# Patient Record
Sex: Male | Born: 1940
Health system: Southern US, Community
[De-identification: ages and names within clinical notes are randomized; demographics above are authoritative.]

## PROBLEM LIST (undated history)

## (undated) DIAGNOSIS — IMO0002 Reserved for concepts with insufficient information to code with codable children: Secondary | ICD-10-CM

## (undated) DIAGNOSIS — C801 Malignant (primary) neoplasm, unspecified: Secondary | ICD-10-CM

## (undated) DIAGNOSIS — N400 Enlarged prostate without lower urinary tract symptoms: Secondary | ICD-10-CM

## (undated) DIAGNOSIS — E785 Hyperlipidemia, unspecified: Secondary | ICD-10-CM

## (undated) DIAGNOSIS — T7840XA Allergy, unspecified, initial encounter: Secondary | ICD-10-CM

## (undated) DIAGNOSIS — G629 Polyneuropathy, unspecified: Secondary | ICD-10-CM

## (undated) DIAGNOSIS — I10 Essential (primary) hypertension: Secondary | ICD-10-CM

## (undated) DIAGNOSIS — R001 Bradycardia, unspecified: Secondary | ICD-10-CM

## (undated) DIAGNOSIS — E291 Testicular hypofunction: Secondary | ICD-10-CM

## (undated) DIAGNOSIS — J329 Chronic sinusitis, unspecified: Secondary | ICD-10-CM

## (undated) DIAGNOSIS — D649 Anemia, unspecified: Secondary | ICD-10-CM

## (undated) DIAGNOSIS — L0293 Carbuncle, unspecified: Secondary | ICD-10-CM

## (undated) DIAGNOSIS — M1990 Unspecified osteoarthritis, unspecified site: Secondary | ICD-10-CM

## (undated) DIAGNOSIS — J189 Pneumonia, unspecified organism: Secondary | ICD-10-CM

## (undated) DIAGNOSIS — J309 Allergic rhinitis, unspecified: Secondary | ICD-10-CM

## (undated) DIAGNOSIS — D333 Benign neoplasm of cranial nerves: Secondary | ICD-10-CM

## (undated) HISTORY — DX: Anemia, unspecified: D64.9

## (undated) HISTORY — DX: Reserved for concepts with insufficient information to code with codable children: IMO0002

## (undated) HISTORY — DX: Unspecified osteoarthritis, unspecified site: M19.90

## (undated) HISTORY — DX: Benign neoplasm of cranial nerves: D33.3

## (undated) HISTORY — PX: COLONOSCOPY: SHX174

## (undated) HISTORY — DX: Carbuncle, unspecified: L02.93

## (undated) HISTORY — DX: Testicular hypofunction: E29.1

## (undated) HISTORY — DX: Hyperlipidemia, unspecified: E78.5

## (undated) HISTORY — PX: POLYPECTOMY: SHX149

## (undated) HISTORY — DX: Allergy, unspecified, initial encounter: T78.40XA

## (undated) HISTORY — PX: STOMACH SURGERY: SHX791

## (undated) HISTORY — PX: CATARACT EXTRACTION: SUR2

## (undated) HISTORY — DX: Essential (primary) hypertension: I10

## (undated) HISTORY — PX: UPPER GASTROINTESTINAL ENDOSCOPY: SHX188

## (undated) HISTORY — DX: Allergic rhinitis, unspecified: J30.9

## (undated) HISTORY — DX: Chronic sinusitis, unspecified: J32.9

## (undated) HISTORY — DX: Benign prostatic hyperplasia without lower urinary tract symptoms: N40.0

---

## 1998-04-15 ENCOUNTER — Encounter: Payer: Self-pay | Admitting: Neurosurgery

## 1998-04-15 ENCOUNTER — Ambulatory Visit (HOSPITAL_COMMUNITY): Admission: RE | Admit: 1998-04-15 | Discharge: 1998-04-15 | Payer: Self-pay | Admitting: Neurosurgery

## 2003-01-17 ENCOUNTER — Encounter: Payer: Self-pay | Admitting: Emergency Medicine

## 2003-01-17 ENCOUNTER — Emergency Department (HOSPITAL_COMMUNITY): Admission: EM | Admit: 2003-01-17 | Discharge: 2003-01-17 | Payer: Self-pay | Admitting: Emergency Medicine

## 2005-05-11 ENCOUNTER — Emergency Department (HOSPITAL_COMMUNITY): Admission: EM | Admit: 2005-05-11 | Discharge: 2005-05-12 | Payer: Self-pay | Admitting: Emergency Medicine

## 2005-05-23 ENCOUNTER — Ambulatory Visit: Payer: Self-pay | Admitting: Internal Medicine

## 2005-05-25 ENCOUNTER — Ambulatory Visit: Payer: Self-pay | Admitting: Internal Medicine

## 2005-06-16 ENCOUNTER — Ambulatory Visit: Payer: Self-pay | Admitting: Gastroenterology

## 2005-06-22 ENCOUNTER — Encounter (INDEPENDENT_AMBULATORY_CARE_PROVIDER_SITE_OTHER): Payer: Self-pay | Admitting: *Deleted

## 2005-06-22 ENCOUNTER — Ambulatory Visit: Payer: Self-pay | Admitting: Gastroenterology

## 2005-06-22 LAB — HM COLONOSCOPY

## 2005-07-05 ENCOUNTER — Ambulatory Visit: Payer: Self-pay | Admitting: Internal Medicine

## 2005-08-05 ENCOUNTER — Inpatient Hospital Stay (HOSPITAL_COMMUNITY): Admission: EM | Admit: 2005-08-05 | Discharge: 2005-08-09 | Payer: Self-pay | Admitting: Emergency Medicine

## 2005-08-07 ENCOUNTER — Ambulatory Visit: Payer: Self-pay | Admitting: Internal Medicine

## 2005-08-16 ENCOUNTER — Ambulatory Visit: Payer: Self-pay | Admitting: Internal Medicine

## 2005-10-04 ENCOUNTER — Ambulatory Visit: Payer: Self-pay | Admitting: Internal Medicine

## 2005-11-22 ENCOUNTER — Ambulatory Visit: Payer: Self-pay | Admitting: Internal Medicine

## 2005-11-29 ENCOUNTER — Ambulatory Visit: Payer: Self-pay | Admitting: Internal Medicine

## 2005-12-27 ENCOUNTER — Ambulatory Visit: Payer: Self-pay | Admitting: Internal Medicine

## 2006-01-10 ENCOUNTER — Ambulatory Visit: Payer: Self-pay | Admitting: Internal Medicine

## 2006-03-24 ENCOUNTER — Ambulatory Visit: Payer: Self-pay | Admitting: Internal Medicine

## 2006-05-24 ENCOUNTER — Ambulatory Visit: Payer: Self-pay | Admitting: Internal Medicine

## 2006-05-24 LAB — CONVERTED CEMR LAB: Creatinine,U: 267.6 mg/dL

## 2006-06-01 ENCOUNTER — Ambulatory Visit: Payer: Self-pay | Admitting: Internal Medicine

## 2006-06-22 ENCOUNTER — Ambulatory Visit: Payer: Self-pay | Admitting: Internal Medicine

## 2006-07-20 ENCOUNTER — Encounter: Payer: Self-pay | Admitting: Internal Medicine

## 2006-07-20 ENCOUNTER — Ambulatory Visit: Payer: Self-pay | Admitting: Internal Medicine

## 2006-07-20 LAB — CONVERTED CEMR LAB
Bacteria, UA: NEGATIVE
Crystals: NEGATIVE
Mucus, UA: NEGATIVE
PSA: 1.56 ng/mL
PSA: 1.56 ng/mL (ref 0.10–4.00)
Specific Gravity, Urine: 1.015 (ref 1.000–1.03)

## 2006-08-17 ENCOUNTER — Ambulatory Visit: Payer: Self-pay | Admitting: Internal Medicine

## 2006-10-31 ENCOUNTER — Encounter: Payer: Self-pay | Admitting: Internal Medicine

## 2006-10-31 DIAGNOSIS — I152 Hypertension secondary to endocrine disorders: Secondary | ICD-10-CM | POA: Insufficient documentation

## 2006-10-31 DIAGNOSIS — F528 Other sexual dysfunction not due to a substance or known physiological condition: Secondary | ICD-10-CM | POA: Insufficient documentation

## 2006-10-31 DIAGNOSIS — I10 Essential (primary) hypertension: Secondary | ICD-10-CM

## 2006-10-31 DIAGNOSIS — E1159 Type 2 diabetes mellitus with other circulatory complications: Secondary | ICD-10-CM | POA: Insufficient documentation

## 2006-10-31 DIAGNOSIS — K219 Gastro-esophageal reflux disease without esophagitis: Secondary | ICD-10-CM | POA: Insufficient documentation

## 2006-12-11 ENCOUNTER — Ambulatory Visit: Payer: Self-pay | Admitting: Internal Medicine

## 2006-12-11 DIAGNOSIS — L0293 Carbuncle, unspecified: Secondary | ICD-10-CM

## 2006-12-11 DIAGNOSIS — L0292 Furuncle, unspecified: Secondary | ICD-10-CM | POA: Insufficient documentation

## 2007-02-15 ENCOUNTER — Ambulatory Visit: Payer: Self-pay | Admitting: Internal Medicine

## 2007-02-15 DIAGNOSIS — K644 Residual hemorrhoidal skin tags: Secondary | ICD-10-CM | POA: Insufficient documentation

## 2007-02-15 DIAGNOSIS — N400 Enlarged prostate without lower urinary tract symptoms: Secondary | ICD-10-CM | POA: Insufficient documentation

## 2007-05-31 ENCOUNTER — Ambulatory Visit: Payer: Self-pay | Admitting: Internal Medicine

## 2007-05-31 LAB — CONVERTED CEMR LAB
AST: 18 units/L (ref 0–37)
Albumin: 3.3 g/dL — ABNORMAL LOW (ref 3.5–5.2)
CO2: 32 meq/L (ref 19–32)
Chloride: 108 meq/L (ref 96–112)
Cholesterol: 154 mg/dL (ref 0–200)
Creatinine, Ser: 1 mg/dL (ref 0.4–1.5)
Glucose, Bld: 107 mg/dL — ABNORMAL HIGH (ref 70–99)
HDL: 31.6 mg/dL — ABNORMAL LOW (ref >39.0)
Hgb A1c MFr Bld: 6.2 % — ABNORMAL HIGH (ref 4.6–6.0)
Sodium: 142 meq/L (ref 135–145)
Total Bilirubin: 0.7 mg/dL (ref 0.3–1.2)
Total Protein: 6.3 g/dL (ref 6.0–8.3)
Triglycerides: 85 mg/dL (ref 0–149)
VLDL: 17 mg/dL (ref 0–40)

## 2007-06-06 ENCOUNTER — Ambulatory Visit: Payer: Self-pay | Admitting: Internal Medicine

## 2007-06-06 DIAGNOSIS — L02439 Carbuncle of limb, unspecified: Secondary | ICD-10-CM | POA: Insufficient documentation

## 2007-06-06 DIAGNOSIS — E785 Hyperlipidemia, unspecified: Secondary | ICD-10-CM | POA: Insufficient documentation

## 2007-06-06 DIAGNOSIS — L02429 Furuncle of limb, unspecified: Secondary | ICD-10-CM | POA: Insufficient documentation

## 2007-08-02 ENCOUNTER — Ambulatory Visit: Payer: Self-pay | Admitting: Internal Medicine

## 2008-07-17 ENCOUNTER — Ambulatory Visit: Payer: Self-pay | Admitting: Internal Medicine

## 2008-07-17 DIAGNOSIS — E119 Type 2 diabetes mellitus without complications: Secondary | ICD-10-CM | POA: Insufficient documentation

## 2008-07-17 LAB — CONVERTED CEMR LAB
AST: 20 units/L (ref 0–37)
Albumin: 4 g/dL (ref 3.5–5.2)
Alkaline Phosphatase: 44 units/L (ref 39–117)
BUN: 15 mg/dL (ref 6–23)
Basophils Relative: 0.3 % (ref 0.0–3.0)
CO2: 32 meq/L (ref 19–32)
Calcium: 9.3 mg/dL (ref 8.4–10.5)
Eosinophils Relative: 1.5 % (ref 0.0–5.0)
Glucose, Bld: 99 mg/dL (ref 70–99)
LDL Cholesterol: 103 mg/dL — ABNORMAL HIGH (ref 0–99)
Lymphocytes Relative: 31.1 % (ref 12.0–46.0)
Microalb Creat Ratio: 1.3 mg/g (ref 0.0–30.0)
Neutrophils Relative %: 55.5 % (ref 43.0–77.0)
RBC: 4.56 M/uL (ref 4.22–5.81)
Sodium: 142 meq/L (ref 135–145)
TSH: 1.2 microintl units/mL (ref 0.35–5.50)
Total Bilirubin: 0.8 mg/dL (ref 0.3–1.2)
VLDL: 18.4 mg/dL (ref 0.0–40.0)
WBC: 4.4 10*3/uL — ABNORMAL LOW (ref 4.5–10.5)

## 2008-07-18 ENCOUNTER — Encounter: Payer: Self-pay | Admitting: Internal Medicine

## 2008-08-21 ENCOUNTER — Encounter: Payer: Self-pay | Admitting: Internal Medicine

## 2008-10-27 ENCOUNTER — Ambulatory Visit: Payer: Self-pay | Admitting: Internal Medicine

## 2008-10-27 DIAGNOSIS — M545 Low back pain, unspecified: Secondary | ICD-10-CM | POA: Insufficient documentation

## 2008-10-27 DIAGNOSIS — M19049 Primary osteoarthritis, unspecified hand: Secondary | ICD-10-CM | POA: Insufficient documentation

## 2008-11-11 ENCOUNTER — Ambulatory Visit: Payer: Self-pay | Admitting: Internal Medicine

## 2008-11-11 ENCOUNTER — Telehealth: Payer: Self-pay | Admitting: Internal Medicine

## 2008-11-12 ENCOUNTER — Telehealth: Payer: Self-pay | Admitting: Internal Medicine

## 2008-11-19 ENCOUNTER — Telehealth: Payer: Self-pay | Admitting: Internal Medicine

## 2008-12-05 ENCOUNTER — Encounter: Payer: Self-pay | Admitting: Internal Medicine

## 2008-12-11 ENCOUNTER — Telehealth: Payer: Self-pay | Admitting: Internal Medicine

## 2009-01-26 ENCOUNTER — Ambulatory Visit: Payer: Self-pay | Admitting: Internal Medicine

## 2009-01-26 DIAGNOSIS — R109 Unspecified abdominal pain: Secondary | ICD-10-CM | POA: Insufficient documentation

## 2009-01-26 LAB — CONVERTED CEMR LAB
CO2: 29 meq/L (ref 19–32)
Calcium: 9.2 mg/dL (ref 8.4–10.5)
Chloride: 106 meq/L (ref 96–112)
Sodium: 140 meq/L (ref 135–145)

## 2009-01-27 ENCOUNTER — Encounter: Payer: Self-pay | Admitting: Internal Medicine

## 2009-04-10 ENCOUNTER — Telehealth: Payer: Self-pay | Admitting: Internal Medicine

## 2009-04-10 DIAGNOSIS — H9190 Unspecified hearing loss, unspecified ear: Secondary | ICD-10-CM | POA: Insufficient documentation

## 2009-04-17 ENCOUNTER — Encounter: Payer: Self-pay | Admitting: Internal Medicine

## 2009-07-27 ENCOUNTER — Ambulatory Visit: Payer: Self-pay | Admitting: Internal Medicine

## 2009-07-27 DIAGNOSIS — B356 Tinea cruris: Secondary | ICD-10-CM | POA: Insufficient documentation

## 2009-07-27 LAB — CONVERTED CEMR LAB
BUN: 19 mg/dL (ref 6–23)
Chloride: 108 meq/L (ref 96–112)
Glucose, Bld: 127 mg/dL — ABNORMAL HIGH (ref 70–99)
Hgb A1c MFr Bld: 6.1 % — ABNORMAL HIGH (ref <5.7)
Indirect Bilirubin: 0.3 mg/dL (ref 0.0–0.9)
LDL Cholesterol: 62 mg/dL (ref 0–99)
PSA: 1.03 ng/mL (ref 0.10–4.00)
Potassium: 4.5 meq/L (ref 3.5–5.3)
Total Protein: 6.1 g/dL (ref 6.0–8.3)
Triglycerides: 74 mg/dL (ref <150)
VLDL: 15 mg/dL (ref 0–40)

## 2009-07-28 ENCOUNTER — Encounter: Payer: Self-pay | Admitting: Internal Medicine

## 2009-08-17 ENCOUNTER — Telehealth: Payer: Self-pay | Admitting: Internal Medicine

## 2009-08-18 ENCOUNTER — Ambulatory Visit: Payer: Self-pay | Admitting: Internal Medicine

## 2009-08-18 LAB — CONVERTED CEMR LAB: Blood Glucose, Fingerstick: 84

## 2009-11-30 ENCOUNTER — Ambulatory Visit: Payer: Self-pay | Admitting: Internal Medicine

## 2009-11-30 LAB — CONVERTED CEMR LAB
Blood in Urine, dipstick: NEGATIVE
Ketones, urine, test strip: NEGATIVE
Nitrite: NEGATIVE
Protein, U semiquant: NEGATIVE
Urobilinogen, UA: 0.2

## 2009-12-02 ENCOUNTER — Ambulatory Visit (HOSPITAL_BASED_OUTPATIENT_CLINIC_OR_DEPARTMENT_OTHER): Admission: RE | Admit: 2009-12-02 | Discharge: 2009-12-02 | Payer: Self-pay | Admitting: Internal Medicine

## 2009-12-02 ENCOUNTER — Telehealth: Payer: Self-pay | Admitting: Internal Medicine

## 2009-12-02 ENCOUNTER — Ambulatory Visit: Payer: Self-pay | Admitting: Radiology

## 2009-12-02 ENCOUNTER — Telehealth: Payer: Self-pay | Admitting: Family

## 2009-12-03 ENCOUNTER — Telehealth: Payer: Self-pay | Admitting: Internal Medicine

## 2009-12-10 ENCOUNTER — Encounter: Payer: Self-pay | Admitting: Internal Medicine

## 2009-12-24 ENCOUNTER — Encounter: Payer: Self-pay | Admitting: Internal Medicine

## 2010-01-01 ENCOUNTER — Ambulatory Visit: Payer: Self-pay | Admitting: Internal Medicine

## 2010-01-01 LAB — CONVERTED CEMR LAB
BUN: 13 mg/dL (ref 6–23)
Calcium: 9.2 mg/dL (ref 8.4–10.5)
Creatinine, Ser: 1.04 mg/dL (ref 0.40–1.50)
Hgb A1c MFr Bld: 6.5 % — ABNORMAL HIGH (ref <5.7)
Potassium: 4.8 meq/L (ref 3.5–5.3)

## 2010-01-03 ENCOUNTER — Encounter: Payer: Self-pay | Admitting: Internal Medicine

## 2010-01-29 ENCOUNTER — Telehealth (INDEPENDENT_AMBULATORY_CARE_PROVIDER_SITE_OTHER): Payer: Self-pay | Admitting: *Deleted

## 2010-05-04 NOTE — Assessment & Plan Note (Signed)
Summary: ELEVATED BLOOD SUGAR/DK   Vital Signs:  Patient profile:   70 year old male Height:      69 inches Weight:      205.25 pounds BMI:     30.42 O2 Sat:      99 % on Room air Temp:     98.0 degrees F oral Pulse rate:   59 / minute Pulse rhythm:   regular Resp:     18 per minute BP sitting:   100 / 80  (left arm) Cuff size:   large  Vitals Entered By: Glendell Docker CMA (Aug 18, 2009 10:55 AM)  O2 Flow:  Room air CC: Rm 2- Elevated Blood Sugar Is Patient Diabetic? Yes CBG Result 84   Primary Care Provider:  Dondra Spry DO  CC:  Rm 2- Elevated Blood Sugar.  History of Present Illness:  70 year old American male with past history of type 2 diabetes complains of hyperglycemia. His blood sugars have been elevated over the past one week (CBG 208-235). He reports getting upper respiratory infection/cold. She self treated with orange juice - drank over carton.  he reports using " talking glucometer".  He denies fever chills. Denies cough. nursing staff reports blood sugars 84 today Previous A1c has been very well controlled  chest pain, no shortness of breath  Allergies (verified): No Known Drug Allergies  Past History:  Past Medical History: Diabetes mellitus, type II GERD  Hypertension   Benign prostatic hypertrophy    Recurrent MRSA carbuncles  Past Surgical History: Cataract extraction x 2         Social History: [name] Married  Former Smoker   Alcohol use-no     Physical Exam  General:  alert, well-developed, and well-nourished.   Neck:  supple and no masses.  no carotid bruits.   Lungs:  normal respiratory effort, normal breath sounds, no crackles, and no wheezes.   Heart:  normal rate, regular rhythm, no murmur, and no gallop.   Abdomen:  soft, non-tender, normal bowel sounds, no hepatomegaly, and no splenomegaly.   Extremities:  No lower extremity edema    Impression & Recommendations:  Problem # 1:  DIABETES MELLITUS, TYPE II,  BORDERLINE (ICD-790.29) patient has been experiencing much higher blood sugars than normal. I suspect hyperglycemia related to recent upper respiratory infection and intake of orange juice. Patient advised use vit c tabs instead.  Patient understands to avoid sugary beverages.    Home glucometer may also be defective.  Rx for One Touch ultra provided.   patient advised to call office if persistent elevations in blood sugar readings. His updated medication list for this problem includes:    Metformin Hcl 500 Mg Tabs (Metformin hcl) ..... By mouth two times a day with meals  Orders: Fingerstick ([address]) Prescription Created Electronically [phone])  Labs Reviewed: Creat: 1.16 (07/27/2009)     Last Eye Exam: normal - glaucoma suspect (08/21/2008)  Complete Medication List: 1)  Metformin Hcl 500 Mg Tabs (Metformin hcl) .... By mouth two times a day with meals 2)  Lovastatin 40 Mg Tabs (Lovastatin) .... One by mouth qhs 3)  Low-dose Aspirin 81 Mg Tabs (Aspirin) .... One by mouth once daily 4)  Terazosin Hcl 2 Mg Caps (Terazosin hcl) .... One by mouth at bedtime 5)  Onetouch Ultra Test Strp (Glucose blood) .... Test once daily as directed  Patient Instructions: 1)  Call our office if your blood sugars are persistently elevated Prescriptions: ONETOUCH ULTRA TEST  STRP (GLUCOSE BLOOD)  test once daily as directed  #100 x 3   Entered and Authorized by:   D. Thomos Lemons DO   Signed by:   D. Thomos Lemons DO on 08/18/2009   Method used:   [facility] [phone][facility] (retail)       [address] [place]  [address]       Ph: [phone] or [phone]       Fax: [phone]   RxID:   [id number]   Laboratory Results   Blood Tests     CBG Random:: 84mg /dL      Current Allergies (reviewed today): No known allergies

## 2010-05-04 NOTE — Letter (Signed)
Summary: [facility]   Imported By: Lanelle Bal 01/12/2010 10:17:41  _____________________________________________________________________  External Attachment:    Type:   Image     Comment:   External Document

## 2010-05-04 NOTE — Progress Notes (Signed)
Summary: call report from [facility]   Phone Note From Other Clinic   Caller: [facility] Radiology Call For: Jonathan Lewis Summary of Call: L4 5 moderate bulge and small to moderate sized central disc protrusion moderate compression of the ventral aspect of fecal sack mild bilateral foraminal narrowing.   Initial call taken by: Roselle Locus,  December 02, 2009 2:58 PM  Follow-up for Phone Call        call pt - MRI of lumbosacral spine shows bulging discs and nerve compression.   I suggest referral to neurosurgeon Follow-up by: D. Thomos Lemons DO,  December 03, 2009 1:18 PM  Additional Follow-up for Phone Call Additional follow up Details #1::        call placed to patient at  [phone]. He was  advised per Dr Jonathan Lewis instructions.

## 2010-05-04 NOTE — Progress Notes (Signed)
Summary: Dr Danielle Dess does not participate with pt ins   Phone Note From Other Clinic   Caller: [facility] Call For: yoo Summary of Call: [facility]  (Dr Danielle Dess) does not participate with the patients insurance.  May I send his referral to another Neurogolist  yes, please see if Dr. Dutch Quint or Dr. Jeral Fruit can see him Initial call taken by: Roselle Locus,  December 03, 2009 2:37 PM  Follow-up for Phone Call        FAXED INFORMATION TO [facility] p[facility]OL AT [facility] n[facility]UROSURGERY  Roselle Locus  December 04, 2009 9:15 AM

## 2010-05-04 NOTE — Consult Note (Signed)
Summary: [facility]   Imported By: Lanelle Bal 12/29/2009 09:15:37  _____________________________________________________________________  External Attachment:    Type:   Image     Comment:   External Document

## 2010-05-04 NOTE — Assessment & Plan Note (Signed)
Summary: 4 week follow up/mhf   Vital Signs:  Patient profile:   70 year old male Weight:      202.50 pounds BMI:     30.01 O2 Sat:      100 % on Room air Temp:     97.9 degrees F oral Pulse rate:   56 / minute Pulse rhythm:   regular Resp:     18 per minute BP sitting:   124 / 90  (left arm) Cuff size:   large  Vitals Entered By: Glendell Docker CMA (January 01, 2010 9:16 AM)  O2 Flow:  Room air CC: 4 Week follow up Is Patient Diabetic? No Pain Assessment Patient in pain? no        Primary Care Aylssa Herrig:  Dondra Spry DO  CC:  4 Week follow up.  History of Present Illness: 70 y/o AA male for f/u  seen by Dr. Jordan Likes placed on prednisone x 12 days - back pain is bettter Dr. Murray Hodgkins planning to perform ESI  has cold symptoms no fever or chills  Blood sugar 124 this am , need rx for  test strips and needles for one touch  Preventive Screening-Counseling & Management  Alcohol-Tobacco     Smoking Status: quit  Allergies: No Known Drug Allergies  Past History:  Past Medical History: Diabetes mellitus, type II GERD   Hypertension   Benign prostatic hypertrophy    Recurrent MRSA carbuncles  Past Surgical History: Cataract extraction x 2           Family History: Mother with history of cancer      Social History: [name] Married  Former Smoker    Alcohol use-no     Physical Exam  General:  alert, well-developed, and well-nourished.   Lungs:  normal respiratory effort, normal breath sounds, no crackles, and no wheezes.   Heart:  normal rate, regular rhythm, no murmur, and no gallop.   Abdomen:  soft, non-tender, normal bowel sounds, no masses, and no hepatomegaly.     Impression & Recommendations:  Problem # 1:  DIABETES MELLITUS, TYPE II (ICD-250.00) Assessment Unchanged  His updated medication list for this problem includes:    Metformin Hcl 500 Mg Tabs (Metformin hcl) ..... By mouth two times a day with meals    Low-dose Aspirin 81 Mg  Tabs (Aspirin) ..... One by mouth once daily ( advised to hold while taking injections)  Orders: T-Basic Metabolic Panel [phone]) T- Hemoglobin A1C ([id number])Future Orders: T- Hemoglobin A1C ([id number]) ... 04/19/2010 T-Urine Microalbumin w/creat. ratio [id number]) ... 04/19/2010  Labs Reviewed: Creat: 1.16 (07/27/2009)     Last Eye Exam: normal - glaucoma suspect (08/21/2008) Reviewed HgBA1c results: 6.1 (07/27/2009)  6.1 (01/26/2009)  Problem # 2:  LOW BACK PAIN, CHRONIC (ICD-724.2) Assessment: Improved seen by Dr. Dutch Quint improved with prednisone some elevation in blood sugar but acceptable ESI planned The following medications were removed from the medication list:    Tramadol Hcl 50 Mg Tabs (Tramadol hcl) ..... One by mouth two times a day prn His updated medication list for this problem includes:    Low-dose Aspirin 81 Mg Tabs (Aspirin) ..... One by mouth once daily ( advised to hold while taking injections)  Complete Medication List: 1)  Metformin Hcl 500 Mg Tabs (Metformin hcl) .... By mouth two times a day with meals 2)  Lovastatin 40 Mg Tabs (Lovastatin) .... One by mouth qhs 3)  Low-dose Aspirin 81 Mg Tabs (Aspirin) .... One by mouth once daily (  advised to hold while taking injections) 4)  Terazosin Hcl 2 Mg Caps (Terazosin hcl) .... One by mouth at bedtime 5)  Onetouch Ultra Test Strp (Glucose blood) .... Test once daily as directed  Other Orders: Flu Vaccine 40yrs + MEDICARE PATIENTS (U0454) Administration Flu vaccine - MCR (U9811) Future Orders: T-Basic Metabolic Panel [id number]) ... 04/19/2010  Patient Instructions: 1)  Please schedule a follow-up appointment in 4 months. 2)  BMP prior to visit, ICD-9:  401.9 3)  HbgA1C prior to visit, ICD-9:  250.00 4)  Urine Microalbumin prior to visit, ICD-9:  250.00 5)  Please return for lab work one (1) week before your next appointment.  6)  Limit your carbohydrate intake as directed   Flu Vaccine  Consent Questions     Do you have a history of severe allergic reactions to this vaccine? no    Any prior history of allergic reactions to egg and/or gelatin? no    Do you have a sensitivity to the preservative Thimersol? no    Do you have a past history of Guillan-Barre Syndrome? no    Do you currently have an acute febrile illness? no    Have you ever had a severe reaction to latex? no    Vaccine information given and explained to patient? yes    Are you currently pregnant? no    Lot Number:[id number]   Exp Date:10/02/2010   Site Given  Left Deltoid IM.  Nicki Guadalajara Fergerson CMA Duncan Dull)  January 01, 2010 10:26 AM

## 2010-05-04 NOTE — Progress Notes (Signed)
Summary: Referral Hearing Clinic  Phone Note Call from Patient Call back at Home Phone [phone] Call back at [phone]   Caller: Patient Reason for Call: Referral Summary of Call: 04/09/209 @ 2:13 p patient called and left voice message requesting a return phone call.  call returned to patient,   Patient is requesting a referral to  [facility] for evaluation of hearing aid. If approved he is requesting the referral to be faxed to [phone]. His appointment is scheduled for 04/17/2009. Initial call taken by: Glendell Docker CMA,  April 10, 2009 8:23 AM  Follow-up for Phone Call        Referral  completed  Follow-up by: Darral Dash,  April 13, 2009 2:15 PM  New Problems: HEARING LOSS (ICD-389.9)   New Problems: HEARING LOSS (ICD-389.9)

## 2010-05-04 NOTE — Therapy (Signed)
[facility]   Imported By: Lanelle Bal 04/28/2009 09:08:22  _____________________________________________________________________  External Attachment:    Type:   Image     Comment:   External Document

## 2010-05-04 NOTE — Assessment & Plan Note (Signed)
Summary: 6 month follow up/mhf   Vital Signs:  Patient profile:   70 year old male Height:      69 inches Weight:      210 pounds BMI:     31.12 Temp:     98.0 degrees F oral Pulse rate:   60 / minute Pulse rhythm:   regular Resp:     16 per minute BP sitting:   120 / 80  (right arm) Cuff size:   large  Vitals Entered By: Glendell Docker CMA (July 27, 2009 7:59 AM) CC: Rm 2- 6 month Follow up Comments c/o right side lower back discomfort in right hip-pt states xray revealed arthritis, c/o sever itch in upper right groin since last visit, low blood sugar 110 high 164, avg 110   Primary Care Provider:  Dondra Spry DO  CC:  Rm 2- 6 month Follow up.  History of Present Illness: 70 y/o AA male for f/u still exp intermittent low back pain symptoms worse in AM.  pain can radiate to groin  he c/o rash in right groin.  pruitic at times  Htn - stable.  DM II borderline - stable.  no wt gain  BPH - stopped finasteride,  no sign improvement.  also worried about side effects after reading med insert  Allergies (verified): No Known Drug Allergies  Past History:  Past Medical History: Diabetes mellitus, type II GERD  Hypertension   Benign prostatic hypertrophy   Recurrent MRSA carbuncles  Past Surgical History: Cataract extraction x 2        Family History: Mother with history of cancer     Social History: [name] Married  Former Smoker   Alcohol use-no    Review of Systems  The patient denies weight gain and chest pain.         occ experiences urinary urgency with small urine leak  Physical Exam  General:  alert, well-developed, and well-nourished.   Lungs:  normal respiratory effort and normal breath sounds.   Heart:  normal rate, regular rhythm, and no gallop.   Abdomen:  soft, non-tender, and normal bowel sounds.   rash along right groin Msk:  no right hip pain with flexion or rotation Extremities:  No lower extremity edema  Neurologic:  cranial  nerves II-XII intact and gait normal.    Diabetes Management Exam:    Foot Exam (with socks and/or shoes not present):       Inspection:          Left foot: normal          Right foot: normal   Impression & Recommendations:  Problem # 1:  LOW BACK PAIN, CHRONIC (ICD-724.2) Pt reports chronic low back pain.  stiffness in AM.  I suspect spondylosis.  refer to PT.  use occ NSAIDs  His updated medication list for this problem includes:    Low-dose Aspirin 81 Mg Tabs (Aspirin) ..... One by mouth once daily  Orders: Physical Therapy Referral (PT)  Problem # 2:  TINEA CRURIS (ICD-110.3) Use lamisil cream and nystatin powder as directed.  Problem # 3:  DIABETES MELLITUS, TYPE II, BORDERLINE (ICD-790.29)  His updated medication list for this problem includes:    Metformin Hcl 500 Mg Tabs (Metformin hcl) ..... By mouth two times a day with meals  Orders: T-Basic Metabolic Panel [id number]) T- Hemoglobin A1C ([id number]) Prescription Created Electronically [phone])  Problem # 4:  HYPERLIPIDEMIA (ICD-272.4)  His updated medication list for this problem includes:  Lovastatin 40 Mg Tabs (Lovastatin) ..... One by mouth qhs  Orders: T-Lipid Profile ([id number]) T-Hepatic Function [phone])  Problem # 5:  BENIGN PROSTATIC HYPERTROPHY (ICD-600.00) He did not notice sign improvement from finasteride.  continue terazosin.  The following medications were removed from the medication list:    Finasteride 5 Mg Tabs (Finasteride) ..... One by mouth qd His updated medication list for this problem includes:    Terazosin Hcl 2 Mg Caps (Terazosin hcl) ..... One by mouth at bedtime  Complete Medication List: 1)  Metformin Hcl 500 Mg Tabs (Metformin hcl) .... By mouth two times a day with meals 2)  Lovastatin 40 Mg Tabs (Lovastatin) .... One by mouth qhs 3)  Low-dose Aspirin 81 Mg Tabs (Aspirin) .... One by mouth once daily 4)  Terazosin Hcl 2 Mg Caps (Terazosin hcl) .... One by mouth  at bedtime 5)  Lamisil At Spray 1 % Soln (Terbinafine hcl) .... Use two times a day as directed x 2 week 6)  Pedi-dri 100000 Unit/gm Powd (Nystatin) .... Use two times a day  Other Orders: TLB-PSA (Prostate Specific Antigen) ([id number])  Patient Instructions: 1)  Please schedule a follow-up appointment in 6 months. Prescriptions: LOVASTATIN 40 MG  TABS (LOVASTATIN) one by mouth qhs  #90 x 3   Entered and Authorized by:   D. Thomos Lemons DO   Signed by:   D. Thomos Lemons DO on 07/27/2009   Method used:   [facility] [phone]* (retail)       [address], [place]  [address]       Ph: [phone] or [phone]       Fax: [phone]   RxID:   [id number] METFORMIN HCL 500 MG  TABS (METFORMIN HCL) by mouth two times a day with meals  #180 x 3   Entered and Authorized by:   D. Thomos Lemons DO   Signed by:   D. Thomos Lemons DO on 07/27/2009   Method used:   [facility]        [facility] [phone]* (retail)       [address], [place]  [address]       Ph: [phone] or [phone]       Fax: [phone]   RxID:   [id number] PEDI-DRI 100000 UNIT/GM POWD (NYSTATIN) use two times a day  #1 month x 1   Entered and Authorized by:   D. Thomos Lemons DO   Signed by:   D. Thomos Lemons DO on 07/27/2009   Method used:   [facility]        [facility] [phone]* (retail)       [address], [place]  [address]       Ph: [phone] or [phone]       Fax: [phone]   RxID:   [id number] LAMISIL AT SPRAY 1 % SOLN (TERBINAFINE HCL) use two times a day as directed x 2 week  #1 bottle x 1   Entered and Authorized by:   D. Thomos Lemons DO   Signed by:   D. Thomos Lemons DO on 07/27/2009   Method used:   [facility]        [facility] [phone]* (retail)       [phone]  [address]       [place], [place]  [address]       Ph: [phone] or [phone]       Fax: [phone]   RxID:    [id number]   Current Allergies (reviewed today): No known allergies

## 2010-05-04 NOTE — Assessment & Plan Note (Signed)
Summary: back pain/dt   Vital Signs:  Patient profile:   70 year old male Height:      69 inches Weight:      205.50 pounds BMI:     30.46 O2 Sat:      99 % on Room air Temp:     98.0 degrees F oral Pulse rate:   59 / minute Pulse rhythm:   regular Resp:     16 per minute BP sitting:   130 / 90  (left arm) Cuff size:   large  Vitals Entered By: Glendell Docker CMA (November 30, 2009 3:17 PM)  O2 Flow:  Room air CC: Back pain Is Patient Diabetic? Yes Did you bring your meter with you today? No Pain Assessment Patient in pain? yes     Location: lower back Intensity: 10 Type: aching Onset of pain  With activity Comments c/ o lower back pain with movement onset last Friday, worse with movement, radiating down legs and hips. Aleve taken with no imrpovement, increase in gas, no bowel movement since Friday. patient states he has not taken the Lovastatin in a couple of weeks. Blood sugar this am 103   Primary Care Provider:  Dondra Spry DO  CC:  Back pain.  History of Present Illness: 70 y/o with hx of LBP reports exacerbation back pain started 1 week ago no recent trauma or injury radiating to thighs / legs - legs feel cold no weakness.  back stiffness worse in AM worse with movement can't get comfortabe position  no fever or chills no urinary complaints  Allergies (verified): No Known Drug Allergies  Past History:  Past Surgical History: Cataract extraction x 2          Review of Systems       constipated - q 2 days  Physical Exam  General:  alert, well-developed, and well-nourished.   Lungs:  normal respiratory effort, normal breath sounds, no crackles, and no wheezes.   Heart:  normal rate, regular rhythm, no murmur, and no gallop.   Extremities:  No lower extremity edema  Neurologic:  cranial nerves II-XII intact.   Left patellar +3,  right patellar + 2 ride sided hip flexor weakness   Impression & Recommendations:  Problem # 1:  LOW BACK  PAIN, CHRONIC (ICD-724.2) 70 y/o AA male with recurrent low back pain with radicular symptoms.  use tramadol as needed.  MRI of LS spine   His updated medication list for this problem includes:    Low-dose Aspirin 81 Mg Tabs (Aspirin) ..... One by mouth once daily    Tramadol Hcl 50 Mg Tabs (Tramadol hcl) ..... One by mouth two times a day prn  Orders: UA Dipstick w/o Micro (manual) ([id number]) Radiology Referral (Radiology)  Complete Medication List: 1)  Metformin Hcl 500 Mg Tabs (Metformin hcl) .... By mouth two times a day with meals 2)  Lovastatin 40 Mg Tabs (Lovastatin) .... One by mouth qhs 3)  Low-dose Aspirin 81 Mg Tabs (Aspirin) .... One by mouth once daily 4)  Terazosin Hcl 2 Mg Caps (Terazosin hcl) .... One by mouth at bedtime 5)  Onetouch Ultra Test Strp (Glucose blood) .... Test once daily as directed 6)  Tramadol Hcl 50 Mg Tabs (Tramadol hcl) .... One by mouth two times a day prn  Patient Instructions: 1)  Please schedule a follow-up appointment in 2 weeks. Prescriptions: TRAMADOL HCL 50 MG TABS (TRAMADOL HCL) one by mouth two times a day prn  #30  x 0   Entered and Authorized by:   D. Thomos Lemons DO   Signed by:   D. Thomos Lemons DO on 11/30/2009   Method used:   [facility]        [facility]. [phone]* (retail)       [address] [place]  [address]       Ph: [phone] or [phone]       Fax: [phone]   RxID:   [id number]   Current Allergies (reviewed today): No known allergies   Laboratory Results   Urine Tests    Routine Urinalysis   Color: yellow Appearance: Clear Glucose: negative   (Normal Range: Negative) Bilirubin: negative   (Normal Range: Negative) Ketone: negative   (Normal Range: Negative) Spec. Gravity: 1.010   (Normal Range: 1.003-1.035) Blood: negative   (Normal Range: Negative) pH: 7.0   (Normal Range: 5.0-8.0) Protein: negative   (Normal Range: Negative) Urobilinogen: 0.2   (Normal Range: 0-1) Nitrite:  negative   (Normal Range: Negative) Leukocyte Esterace: negative   (Normal Range: Negative)

## 2010-05-04 NOTE — Progress Notes (Signed)
Summary: Order for Imagining Dept  Phone Note From Other Clinic Call back at [phone]   Caller: Provider Summary of Call: Misty Stanley from Imaging called and said that pt has a history of metals, she needs an order for orbits Initial call taken by: Lannette Donath,  December 02, 2009 11:00 AM  Follow-up for Phone Call        could you pls clarify what they are asking for? Follow-up by: Lemont Fillers FNP,  December 02, 2009 11:11 AM

## 2010-05-04 NOTE — Letter (Signed)
   [place] at [facility] [address] [place], [place]  [address]  [place] Phone: [phone]      January 03, 2010   [facility] [place] [address] [place], [place] [address]  RE:  LAB RESULTS  Dear  Mr. Jaso,  The following is an interpretation of your most recent lab tests.  Please take note of any instructions provided or changes to medications that have resulted from your lab work.  ELECTROLYTES:  Good - no changes needed  KIDNEY FUNCTION TESTS:  Good - no changes needed    DIABETIC STUDIES:  Good - no changes needed Blood Glucose: 111   HgbA1C: 6.5   Microalbumin/Creatinine Ratio: 1.3          Sincerely Yours,    Dr. Thomos Lemons  Appended Document:  mailed

## 2010-05-04 NOTE — Progress Notes (Signed)
Summary: Elevated Blood Sugar  Phone Note Call from Patient Call back at Home Phone [phone]   Caller: Patient Summary of Call: patient called and left voice message stating his blood sugars have been above 200 and he would like to know if there is any medication changes that are needed Initial call taken by: Glendell Docker CMA,  Aug 17, 2009 2:59 PM  Follow-up for Phone Call        needs OV to eval Follow-up by: D. Thomos Lemons DO,  Aug 17, 2009 3:09 PM  Additional Follow-up for Phone Call Additional follow up Details #1::        patient has beenn advised per Dr Artist Pais instructions. Appointment scheduled for 5/17 @ 10:45 am Additional Follow-up by: Glendell Docker CMA,  Aug 17, 2009 3:38 PM

## 2010-05-04 NOTE — Letter (Signed)
   [facility] [facility] [address] [place], [place]  [address]  [place] Phone: [phone]      July 28, 2009   [facility] [place] [address] [place], [place] [address]  RE:  LAB RESULTS  Dear  Mr. Schuenemann,  The following is an interpretation of your most recent lab tests.  Please take note of any instructions provided or changes to medications that have resulted from your lab work.  ELECTROLYTES:  Good - no changes needed  KIDNEY FUNCTION TESTS:  Good - no changes needed  LIVER FUNCTION TESTS:  Good - no changes needed  LIPID PANEL:  Good - no changes needed Triglyceride: 74   Cholesterol: 115   LDL: 62   HDL: 38   Chol/HDL%:  3.0 Ratio  THYROID STUDIES:  Thyroid studies normal TSH: 1.20     DIABETIC STUDIES:  Good - no changes needed Blood Glucose: 127   HgbA1C: 6.1   Microalbumin/Creatinine Ratio: 1.3          Sincerely Yours,    Dr. Thomos Lemons

## 2010-05-04 NOTE — Progress Notes (Signed)
Summary: refill   Phone Note Refill Request Message from:  Patient on January 29, 2010 11:33 AM  Refills Requested: Medication #1:  ONETOUCH ULTRA TEST  STRP test once daily as directed.   Dosage confirmed as above?Dosage Confirmed   Brand Name Necessary? No   Supply Requested: 1 month He also needs the lancets    Method Requested: Electronic Next Appointment Scheduled: 05-06-2010 Dr Artist Pais  Initial call taken by: Roselle Locus,  January 29, 2010 11:35 AM    New/Updated Medications: LANCETS  MISC (LANCETS) Use as directed to check sugars Prescriptions: LANCETS  MISC (LANCETS) Use as directed to check sugars  #100 x 3   Entered by:   Payton Spark CMA   Authorized by:   D. Thomos Lemons DO   Signed by:   Payton Spark CMA on 01/29/2010   Method used:   Electronically to        [facility] [phone]* (retail)       [address] [place]  [address]       Ph: [phone] or [phone]       Fax: [phone]   RxID:   [id number] ONETOUCH ULTRA TEST  STRP (GLUCOSE BLOOD) test once daily as directed  #100 x 3   Entered by:   Payton Spark CMA   Authorized by:   D. Thomos Lemons DO   Signed by:   Payton Spark CMA on 01/29/2010   Method used:   [facility]        [facility] [phone]* (retail)       [address]       [place], [place]  [address]       Ph: [phone] or [phone]       Fax: [phone]   RxID:   [id number]

## 2010-05-06 ENCOUNTER — Ambulatory Visit (INDEPENDENT_AMBULATORY_CARE_PROVIDER_SITE_OTHER): Payer: No Typology Code available for payment source | Admitting: Internal Medicine

## 2010-05-06 ENCOUNTER — Other Ambulatory Visit: Payer: Self-pay | Admitting: Internal Medicine

## 2010-05-06 ENCOUNTER — Encounter: Payer: Self-pay | Admitting: Internal Medicine

## 2010-05-06 ENCOUNTER — Ambulatory Visit: Admit: 2010-05-06 | Payer: Self-pay | Admitting: Internal Medicine

## 2010-05-06 DIAGNOSIS — E119 Type 2 diabetes mellitus without complications: Secondary | ICD-10-CM

## 2010-05-06 DIAGNOSIS — E785 Hyperlipidemia, unspecified: Secondary | ICD-10-CM

## 2010-05-06 DIAGNOSIS — R1011 Right upper quadrant pain: Secondary | ICD-10-CM | POA: Insufficient documentation

## 2010-05-06 DIAGNOSIS — M545 Low back pain, unspecified: Secondary | ICD-10-CM

## 2010-05-06 DIAGNOSIS — I10 Essential (primary) hypertension: Secondary | ICD-10-CM

## 2010-05-06 LAB — CONVERTED CEMR LAB
ALT: 12 units/L (ref 0–53)
AST: 17 units/L (ref 0–37)
Alkaline Phosphatase: 40 units/L (ref 39–117)
Basophils Absolute: 0 10*3/uL (ref 0.0–0.1)
Bilirubin Urine: NEGATIVE
Blood, UA: NEGATIVE
CO2: 27 meq/L (ref 19–32)
Chloride: 106 meq/L (ref 96–112)
Creatinine, Urine: 128.7 mg/dL
Hemoglobin: 12.5 g/dL — ABNORMAL LOW (ref 13.0–17.0)
Indirect Bilirubin: 0.3 mg/dL (ref 0.0–0.9)
Lymphocytes Relative: 55 % — ABNORMAL HIGH (ref 12–46)
Microalb Creat Ratio: 3.9 mg/g (ref 0.0–30.0)
Monocytes Absolute: 0.4 10*3/uL (ref 0.1–1.0)
Neutro Abs: 1 10*3/uL — ABNORMAL LOW (ref 1.7–7.7)
Platelets: 197 10*3/uL (ref 150–400)
Potassium: 4.8 meq/L (ref 3.5–5.3)
Protein, ur: NEGATIVE mg/dL
RDW: 12.1 % (ref 11.5–15.5)
Sodium: 142 meq/L (ref 135–145)
Total Protein: 6.5 g/dL (ref 6.0–8.3)
Urine Glucose: NEGATIVE mg/dL

## 2010-05-07 ENCOUNTER — Ambulatory Visit (HOSPITAL_BASED_OUTPATIENT_CLINIC_OR_DEPARTMENT_OTHER)
Admission: RE | Admit: 2010-05-07 | Discharge: 2010-05-07 | Disposition: A | Discharge status: Home / Self Care | Payer: No Typology Code available for payment source | Source: Ambulatory Visit | Attending: Internal Medicine | Admitting: Internal Medicine

## 2010-05-07 ENCOUNTER — Telehealth: Payer: Self-pay | Admitting: Internal Medicine

## 2010-05-07 DIAGNOSIS — R1011 Right upper quadrant pain: Secondary | ICD-10-CM

## 2010-05-07 DIAGNOSIS — N200 Calculus of kidney: Secondary | ICD-10-CM | POA: Insufficient documentation

## 2010-05-07 DIAGNOSIS — K449 Diaphragmatic hernia without obstruction or gangrene: Secondary | ICD-10-CM | POA: Insufficient documentation

## 2010-05-07 MED ORDER — IOHEXOL 300 MG/ML  SOLN: 100.0000 mL | Freq: Once | INTRAMUSCULAR | Status: AC | PRN

## 2010-05-20 NOTE — Progress Notes (Signed)
Summary: Test Results  Phone Note Outgoing Call   Summary of Call: call pt - CT of Abd and pelvis negative for mass.  gallbladder appears normal.   small 3 mm kidney stone in right kidney.  I doubt this is cause of pain. Initial call taken by: D. Thomos Lemons DO,  May 07, 2010 12:52 PM  Follow-up for Phone Call        I suggest pt try taking bulk laxative.  metamucille  1 tsp with 8 -10 oz of water daily. also try taking accu flora (OTC probiotic) at [facility] once daily x 2 weeks   Follow-up by: D. Thomos Lemons DO,  May 07, 2010 12:53 PM  Additional Follow-up for Phone Call Additional follow up Details #1::        call placed to patient at [phone], no answer. A detailed voice message was left informing patient per Dr Artist Pais instructions. Message was left for patient to return call if any questions. Additional Follow-up by: Glendell Docker CMA,  May 07, 2010 2:13 PM

## 2010-05-26 NOTE — Assessment & Plan Note (Signed)
Summary: 4 month fu/dt   Vital Signs:  Patient profile:   70 year old male Height:      69 inches Weight:      209.25 pounds BMI:     31.01 O2 Sat:      98 % on Room air Temp:     97.7 degrees F oral Pulse rate:   59 / minute Resp:     16 per minute BP sitting:   130 / 80  (right arm) Cuff size:   large  Vitals Entered By: Glendell Docker CMA (May 06, 2010 10:24 AM)  O2 Flow:  Room air CC: 4 Month Follow up Is Patient Diabetic? Yes Did you bring your meter with you today? No Pain Assessment Patient in pain? no      Comments c/o unresolved discomfort in his lower right stomach and upper back, bilateral shoulder aches at night when trying to sleep. c/o increasae in upper abdomen when he is stressed, low blood sugar 132-High 150, avg 130-140, after getting shots in his back   Primary Care Provider:  DThomos Lemons DO  CC:  4 Month Follow up.  History of Present Illness: 70 y/o male for f/u since prev visit, 3 ESIs back pain is improved steroid injections worsened blood sugar control AM blood sugars 120-130  DM II  stools can be occ loose doesn't always have BM daily  c/o intermittent right sided abd pain does not notice when pt moving around sitting or laying makes in worse feels like pressure sensation,  not severe no relation to food no urinary complaints  Preventive Screening-Counseling & Management  Alcohol-Tobacco     Smoking Status: quit  Allergies (verified): No Known Drug Allergies  Past History:  Past Medical History: Diabetes mellitus, type II GERD   Hypertension   Benign prostatic hypertrophy     Recurrent MRSA carbuncles  Past Surgical History: Cataract extraction x 2            Family History: Mother with history of cancer       Social History: [name] Married  Former Smoker    Alcohol use-no      Review of Systems  The patient denies anorexia and weight loss.    Physical Exam  General:  alert and  overweight-appearing.   Eyes:  pupils equal, pupils round, and pupils reactive to light.   Mouth:  pharynx pink and moist.   Lungs:  normal respiratory effort, normal breath sounds, no crackles, and no wheezes.   Heart:  normal rate, regular rhythm, no murmur, and no gallop.   Abdomen:  mild abd obesity,  non-tender, normal bowel sounds, no masses, no hepatomegaly, and no splenomegaly.   Extremities:  trace left pedal edema and trace right pedal edema.   Neurologic:  cranial nerves II-XII intact and gait normal.     Impression & Recommendations:  Problem # 1:  ABDOMINAL PAIN RIGHT UPPER QUADRANT (ICD-789.01) 70 y/o with chronic unexplained RUQ pain.  rule out malignancy  Orders: T-Hepatic Function [phone]) T-CBC w/Diff [phone]) T-Urinalysis ([phone]) Radiology Referral (Radiology)  Problem # 2:  HYPERTENSION (ICD-401.9) Assessment: Unchanged  His updated medication list for this problem includes:    Terazosin Hcl 2 Mg Caps (Terazosin hcl) ..... One by mouth at bedtime  Orders: T-Basic Metabolic Panel [id number])  BP today: 130/80 Prior BP: 124/90 (01/01/2010)  Labs Reviewed: K+: 4.8 (01/01/2010) Creat: : 1.04 (01/01/2010)   Chol: 115 (07/27/2009)   HDL: 38 (07/27/2009)   LDL:  62 (07/27/2009)   TG: 74 (07/27/2009)  Problem # 3:  LOW BACK PAIN, CHRONIC (ICD-724.2) Assessment: Improved improved with steroid injections  His updated medication list for this problem includes:    Low-dose Aspirin 81 Mg Tabs (Aspirin) ..... One by mouth once daily ( advised to hold while taking injections)  Problem # 4:  DIABETES MELLITUS, TYPE II (ICD-250.00) Assessment: Deteriorated mild exacerbation with ESIs monitor A1c  His updated medication list for this problem includes:    Metformin Hcl 500 Mg Tabs (Metformin hcl) ..... By mouth two times a day with meals    Low-dose Aspirin 81 Mg Tabs (Aspirin) ..... One by mouth once daily ( advised to hold while taking  injections)    Januvia 100 Mg Tabs (Sitagliptin phosphate) ..... One by mouth once daily  Orders: T- Hemoglobin A1C ([id number]) T-Urine Microalbumin w/creat. ratio [phone])  Complete Medication List: 1)  Metformin Hcl 500 Mg Tabs (Metformin hcl) .... By mouth two times a day with meals 2)  Lovastatin 40 Mg Tabs (Lovastatin) .... One by mouth qhs 3)  Low-dose Aspirin 81 Mg Tabs (Aspirin) .... One by mouth once daily ( advised to hold while taking injections) 4)  Terazosin Hcl 2 Mg Caps (Terazosin hcl) .... One by mouth at bedtime 5)  Onetouch Ultra Test Strp (Glucose blood) .... Test once daily as directed 6)  Lancets Misc (Lancets) .... Use as directed to check sugars 7)  Januvia 100 Mg Tabs (Sitagliptin phosphate) .... One by mouth once daily  Patient Instructions: 1)  Please schedule a follow-up appointment in 1 month. 2)  Our office will contact you to schedule CT of abd and pelvis Prescriptions: LANCETS  MISC (LANCETS) Use as directed to check sugars  #100 x 3   Entered and Authorized by:   D. Thomos Lemons DO   Signed by:   D. Thomos Lemons DO on 05/06/2010   Method used:   Print then Give to Patient   RxID:   [id number] ONETOUCH ULTRA TEST  STRP (GLUCOSE BLOOD) test once daily as directed  #100 x 3   Entered and Authorized by:   D. Thomos Lemons DO   Signed by:   D. Thomos Lemons DO on 05/06/2010   Method used:   Print then Give to Patient   RxID:   [id number] TERAZOSIN HCL 2 MG CAPS (TERAZOSIN HCL) one by mouth at bedtime  #90 Capsule x 1   Entered and Authorized by:   D. Thomos Lemons DO   Signed by:   D. Thomos Lemons DO on 05/06/2010   Method used:   Print then Give to Patient   RxID:   [id number] LOVASTATIN 40 MG  TABS (LOVASTATIN) one by mouth qhs  #90 Tablet x 1   Entered and Authorized by:   D. Thomos Lemons DO   Signed by:   D. Thomos Lemons DO on 05/06/2010   Method used:   Print then Give to Patient   RxID:   [id number] METFORMIN HCL 500 MG  TABS  (METFORMIN HCL) by mouth two times a day with meals  #180 Tablet x 1   Entered and Authorized by:   D. Thomos Lemons DO   Signed by:   D. Thomos Lemons DO on 05/06/2010   Method used:   Print then Give to Patient   RxID:   [id number] JANUVIA 100 MG TABS (SITAGLIPTIN PHOSPHATE) one by mouth once daily  #30 x 3   Entered and Authorized by:   D.  Thomos Lemons DO   Signed by:   D. Thomos Lemons DO on 05/06/2010   Method used:   [facility] to        [facility] [phone]* (retail)       [address], [place]  [address]       Ph: [phone] or [phone]       Fax: [phone]   RxID:   [id number]    Orders Added: 1)  T-Basic Metabolic Panel [phone] 2)  T- Hemoglobin A1C [83036-23375] 3)  T-Urine Microalbumin w/creat. ratio [82043-[address]] 4)  T-Hepatic Function [80076-22960] 5)  T-CBC w/Diff [10932-35573] 6)  T-Urinalysis [81003-65000] 7)  Radiology Referral [Radiology] 8)  Est. Patient Level IV [[address]]    Current Allergies (reviewed today): No known allergies

## 2010-07-03 ENCOUNTER — Encounter: Payer: Self-pay | Admitting: Internal Medicine

## 2010-07-08 ENCOUNTER — Encounter: Payer: Self-pay | Admitting: Internal Medicine

## 2010-07-08 ENCOUNTER — Ambulatory Visit (INDEPENDENT_AMBULATORY_CARE_PROVIDER_SITE_OTHER): Payer: No Typology Code available for payment source | Admitting: Internal Medicine

## 2010-07-08 DIAGNOSIS — E785 Hyperlipidemia, unspecified: Secondary | ICD-10-CM

## 2010-07-08 DIAGNOSIS — M79602 Pain in left arm: Secondary | ICD-10-CM

## 2010-07-08 DIAGNOSIS — E119 Type 2 diabetes mellitus without complications: Secondary | ICD-10-CM

## 2010-07-08 DIAGNOSIS — M79609 Pain in unspecified limb: Secondary | ICD-10-CM

## 2010-07-08 MED ORDER — PRAVASTATIN SODIUM 40 MG PO TABS: 40.0000 mg | ORAL_TABLET | Freq: Every day | ORAL | Status: DC

## 2010-07-08 MED ORDER — METFORMIN HCL 500 MG PO TABS: 500.0000 mg | ORAL_TABLET | Freq: Two times a day (BID) | ORAL | Status: DC

## 2010-07-08 MED ORDER — DICLOFENAC SODIUM 1 % TD GEL: TRANSDERMAL | Status: DC

## 2010-07-08 MED ORDER — GABAPENTIN 100 MG PO CAPS: 100.0000 mg | ORAL_CAPSULE | Freq: Two times a day (BID) | ORAL | Status: DC

## 2010-07-08 NOTE — Patient Instructions (Signed)
Please complete blood work before your next follow up appointment BMET, A1c - 250.00 FLP, LFTs - 272.4

## 2010-07-08 NOTE — Progress Notes (Signed)
  Subjective:    [name], male    DOB: 11-09-1940, 70 y.o.   MRN: [id number]  HPI  70 y/o male for follw up Pt reported intermittent bilateral shoulder pain.  He notes numbness in left upper arm.  Onset 3-6 months. No specific trigger.  No hx of neck injury or trauma  DM II - stable  Htn - stable   Review of Systems  No chest pain.  No SOB  Past Medical History  Diagnosis Date  . Diabetes mellitus     Type II  . Hypertension   . BPH (benign prostatic hyperplasia)   . Carbuncle     recurrent MRSA carbuncles    History   Social History  . Marital Status: Married    Spouse Name: N/A    Number of Children: N/A  . Years of Education: N/A   Occupational History  . Not on file.   Social History Main Topics  . Smoking status: Former Games developer  . Smokeless tobacco: Not on file  . Alcohol Use: Not on file  . Drug Use: Not on file  . Sexually Active: Not on file   Other Topics Concern  . Not on file   Social History Narrative   PastorMarried Former Smoker   Alcohol use-no     Past Surgical History  Procedure Date  . Cataract extraction     x 2    Family History  Problem Relation Age of Onset  . Cancer Mother     No Known Allergies  Current Outpatient Prescriptions on File Prior to Visit  Medication Sig Dispense Refill  . aspirin 81 MG tablet Take 81 mg by mouth daily.        Marland Kitchen glucose blood (ONE TOUCH TEST STRIPS) test strip 1 each by Other route as needed. Use as instructed to check blood sugar once daily (dx 250.00)      . ONE TOUCH LANCETS MISC by Does not apply route. Use to check blood sugar once daily (dx code 250.00)      . terazosin (HYTRIN) 2 MG capsule Take 2 mg by mouth at bedtime.        . sitaGLIPtan (JANUVIA) 100 MG tablet Take 100 mg by mouth daily.          BP 124/90  Pulse 52  Temp(Src) 97.9 F (36.6 C) (Oral)  Resp 16  Ht 5\' 9"  (1.753 m)  Wt 206 lb (93.441 kg)  BMI 30.42 kg/m2  SpO2 100%           Objective:   Physical Exam  Constitutional: He is oriented to person, place, and time. He appears well-developed and well-nourished.  HENT:  Head: Normocephalic and atraumatic.  Neck: Normal range of motion. Neck supple.  Cardiovascular: Normal rate, regular rhythm and normal heart sounds.   Pulmonary/Chest: Effort normal and breath sounds normal. He has no wheezes. He has no rales.  Lymphadenopathy:    He has no cervical adenopathy.  Neurological: He is oriented to person, place, and time. He has normal reflexes. He displays normal reflexes. No cranial nerve deficit. He exhibits normal muscle tone.          Assessment & Plan:

## 2010-07-10 ENCOUNTER — Encounter: Payer: Self-pay | Admitting: Internal Medicine

## 2010-07-10 DIAGNOSIS — M79602 Pain in left arm: Secondary | ICD-10-CM | POA: Insufficient documentation

## 2010-07-10 NOTE — Assessment & Plan Note (Signed)
Stable.  No change in meds. Lab Results  Component Value Date   HGBA1C 6.3* 05/06/2010

## 2010-07-10 NOTE — Assessment & Plan Note (Signed)
Change lovastatin to pravastatin. Goal LDL < 100.  FLP and LFTs before next OV Lab Results  Component Value Date   CHOL 115 07/27/2009   CHOL 158 07/17/2008   CHOL 154 05/31/2007   Lab Results  Component Value Date   HDL 38* 07/27/2009   HDL 36.60* 07/17/2008   HDL 31.6* 05/31/2007   Lab Results  Component Value Date   LDLCALC 62 07/27/2009   LDLCALC 103* 07/17/2008   LDLCALC 105* 05/31/2007   Lab Results  Component Value Date   TRIG 74 07/27/2009   TRIG 92.0 07/17/2008   TRIG 85 05/31/2007   Lab Results  Component Value Date   CHOLHDL 3.0 Ratio 07/27/2009   CHOLHDL 4 07/17/2008   CHOLHDL 4.9 CALC 05/31/2007

## 2010-07-10 NOTE — Assessment & Plan Note (Signed)
Pt with intermittent left upper arm pain I suspect mild cervical radiculopathy.   Trial of gabapentin

## 2010-07-14 ENCOUNTER — Other Ambulatory Visit: Payer: Self-pay | Admitting: Internal Medicine

## 2010-07-14 DIAGNOSIS — E785 Hyperlipidemia, unspecified: Secondary | ICD-10-CM

## 2010-07-14 DIAGNOSIS — E119 Type 2 diabetes mellitus without complications: Secondary | ICD-10-CM

## 2010-08-20 NOTE — Discharge Summary (Signed)
NAME:  Jonathan Lewis, Jonathan Lewis NO.:  [id number]   MEDICAL RECORD NO.:  [id number]          PATIENT TYPE:  INP   LOCATION:  6703                         FACILITY:  [facility]   PHYSICIAN:  Rene Paci, M.D. LHCDATE OF BIRTH:  November 07, 1940   DATE OF ADMISSION:  08/04/2005  DATE OF DISCHARGE:  08/09/2005                                 DISCHARGE SUMMARY   DISCHARGE DIAGNOSES:  1.  Community-acquired pneumonia.  2.  Hypertension with episode of hypotension during this hospitalization.  3.  Hyperglycemia.  4.  Mild normocytic anemia.   HISTORY OF PRESENT ILLNESS:  The patient is a 70 year old African-American  male admitted on Aug 05, 2005, with chief complaint of chills.  The patient  also reported productive cough of white sputum.  He was noted to be  hypotensive in the ER and received normal saline boluses.  He was admitted  for further evaluation.   PAST MEDICAL HISTORY:  1.  Hypertension.  2.  Hyperglycemia.  3.  Gastroesophageal reflux disease.   COURSE OF HOSPITALIZATION:  #1 - COMMUNITY-ACQUIRED PNEUMONIA.  The patient  was admitted and was placed on IV Rocephin and p.o. azithromycin.  He  continued to improve and was successfully changed to p.o. Ceftin.  The  patient remained afebrile.  Ceftin will be continued for a total of 7 days.  Of note, the patient did have an episode of epistaxis during this admission  and was coughing up blood-tinged sputum.  Presumably this blood-tinged  sputum was secondary to drainage from epistaxis.  He has had no further  hemoptysis this admission.   #2 - HYPERTENSION.  The patient was noted to have low blood pressure on  admission.  His ACE inhibitor was held.  At the time of discharge the  patient's blood pressure is currently 104/71.  His ACE inhibitor will be  held at time of discharge and will need reevaluation as an outpatient.   #3 - HYPERGLYCEMIA.  The patient was placed on CBGs during this admission  with sliding-scale  coverage.  Sliding-scale coverage was discontinued as the  patient was noted to have hypoglycemia after coverage.  His A1c this  admission was 6.4 and this will need continued outpatient monitoring.   #4 - MILD NORMOCYTIC ANEMIA.  The patient's hemoglobin at time of discharge  is 11.7, hematocrit is 35.1.  Consider outpatient workup to include  colonoscopy if not previously done.   MEDICATIONS AT DISCHARGE:  1.  Ceftin 500 mg p.o. b.i.d. for 7 days.  2.  Benazepril 5 mg p.o. daily.  The patient is instructed to hold this      medication until followup with Dr. Artist Pais.  3.  Omeprazole 20 mg p.o. daily.   LABORATORIES AT DISCHARGE:  Hemoglobin 11.7, hematocrit 35.1.  BUN 14,  creatinine 1.4.   FOLLOWUP:  The patient is scheduled to follow up with Dr. Thomos Lemons on Aug 16, 2005, at 2:30 p.m.  He is instructed to call Dr. Artist Pais should he develop  fever of 10, cough, or if he begins to cough up blood.      Melissa  S. Peggyann Juba, NP      Rene Paci, M.D. [facility]  Electronically Signed    MSO/MEDQ  D:  08/09/2005  T:  08/10/2005  Job:  [id number]

## 2010-08-20 NOTE — H&P (Signed)
NAME:  Jonathan Lewis, Jonathan Lewis NO.:  [id number]   MEDICAL RECORD NO.:  [id number]          PATIENT TYPE:  INP   LOCATION:  1826                         FACILITY:  [facility]   PHYSICIAN:  Rod Holler, MD      DATE OF BIRTH:  [birth year]   DATE OF ADMISSION:  08/04/2005  DATE OF DISCHARGE:                                HISTORY & PHYSICAL   CHIEF COMPLAINT:  Chills.   HISTORY OF PRESENT ILLNESS:  Jonathan Lewis is a 70 year old male who presented  to the emergency department with less than a 1-day history of chills and  rigors at home.  The patient did not take his temperature.  He has had  complaints of a cough productive of white sputum.  He has no complaints of  shortness of breath and no chest pain.  He has no known sick contacts.  He  has no true syncope, but has had some lightheadedness.  He also had 1  episode of emesis in the emergency department.  In the emergency department,  the patient was treated as a community-acquired pneumonia and given Rocephin  and azithromycin.  The patient has also had some drop in his blood pressure,  and has received normal saline boluses in the emergency department.   PAST MEDICAL HISTORY:  1.  Hypertension.  2.  Hyperglycemia.  3.  Gastroesophageal reflux disease.   MEDICINES:  1.  Claritin.  2.  Omeprazole 20 mg p.o. daily.  3.  Benazepril 5 mg p.o. daily.   ALLERGIES:  No known drug allergies.   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   FAMILY HISTORY:  Mother with history of cancer; the patient does not know  his father.   REVIEW OF SYSTEMS:  All systems reviewed in detail and are negative, except  as noted in the history of present illness.   PHYSICAL EXAMINATION:  VITAL SIGNS:  Blood pressure 90s/60s, heart rate in  the 90s, temperature 102.9, oxygen saturation 96% on 2 L by nasal cannula.  GENERAL:  A well-developed, well-nourished male, alert and oriented x3, in  no  acute respiratory distress.  HEENT:  Atraumatic, normocephalic.  Pupils are equal, round and reactive to  light, extraocular movements intact.  Oropharynx clear.  NECK:  Supple with no adenopathy, no JVD and no carotid bruits.  CHEST:  Rhonchi at the left base, otherwise clear to auscultation  bilaterally with equal bilateral breath sounds.  CORONARY:  Regular rhythm, normal rate, normal S1 and S2.  No murmurs, rubs,  or gallops.  Peripheral pulses 2+.  ABDOMEN:  Soft, nontender and non-distended.  Active bowel sounds.  No  hepatosplenomegaly.  EXTREMITIES:  No clubbing, cyanosis, or edema.  NEUROLOGIC:  No focal deficits.  SKIN:  No rashes.  PSYCHIATRIC:  Normal affect.   LABORATORY AND ACCESSORY CLINICAL DATA:  EKG shows a sinus tachycardia with  nonspecific ST and T wave changes.   Urinalysis:  Specific gravity of 1.014, otherwise negative.  Sodium 139,  potassium 3.9, chloride 102, bicarb 28, BUN 14, creatinine  1.4, glucose 154,  total bilirubin 0.7, alkaline phosphatase 46, SGOT of 22, SGPT of 16, total  protein of 6.2, albumin of 3.9, calcium 8.9.  White blood cell count of 6.2,  hematocrit 39.4, platelets 170,000.   Chest x-ray shows left basilar atelectasis, peribronchial thickening.   IMPRESSION:  Jonathan Lewis is a 70 year old male who presents with a probable  community-acquired pneumonia.   PLAN:  1.  Pulmonary/infectious disease:  The patient was given Rocephin and      azithromycin in the emergency department, which we will continue.  Blood      cultures x2 were drawn in the emergency department.  We will get a      sputum Gram's stain and culture.  The patient will get supplemental      oxygen to keep O2 SATs greater than 93%.  2.  Cardiovascular:  We will hold his Benazepril secondary to his blood      pressures.  3.  GI:  We will place the patient on his home omeprazole dose.  4.  Fluids, electrolytes and nutrition:  I will give the patient another 500-      mL  normal saline bolus in the emergency department and place him on      normal saline at 125 mL/hr and place him on a regular diet.  5.  Hematologic:  We will get a CBC on Aug 06, 2005.  6.  Renal:  We will check a BMP on Aug 06, 2005.  7.  Endocrine:  We will get q.a.c. and nightly blood glucoses, place the      patient on low-intensity sliding-scale insulin, and get a hemoglobin A1c      with next lab draw.  8.  Peripheral access:  We will give the patient an incentive spirometer to      use, and give him subcutaneous heparin for DVT prophylaxis.      Rod Holler, MD  Electronically Signed     TRK/MEDQ  D:  08/05/2005  T:  08/05/2005  Job:  [id number]

## 2010-09-13 ENCOUNTER — Encounter: Payer: Self-pay | Admitting: Gastroenterology

## 2010-10-08 ENCOUNTER — Other Ambulatory Visit: Payer: Self-pay | Admitting: Internal Medicine

## 2010-10-09 LAB — HEPATIC FUNCTION PANEL
Albumin: 4.2 g/dL (ref 3.5–5.2)
Total Bilirubin: 0.4 mg/dL (ref 0.3–1.2)
Total Protein: 6.7 g/dL (ref 6.0–8.3)

## 2010-10-09 LAB — BASIC METABOLIC PANEL
CO2: 28 mEq/L (ref 19–32)
Chloride: 106 mEq/L (ref 96–112)
Potassium: 4.2 mEq/L (ref 3.5–5.3)
Sodium: 142 mEq/L (ref 135–145)

## 2010-10-09 LAB — LIPID PANEL
HDL: 40 mg/dL (ref >39)
LDL Cholesterol: 128 mg/dL — ABNORMAL HIGH (ref 0–99)
Total CHOL/HDL Ratio: 4.7 Ratio
Triglycerides: 96 mg/dL (ref <150)
VLDL: 19 mg/dL (ref 0–40)

## 2010-10-09 LAB — HEMOGLOBIN A1C: Hgb A1c MFr Bld: 6.5 % — ABNORMAL HIGH (ref <5.7)

## 2010-10-11 ENCOUNTER — Ambulatory Visit (AMBULATORY_SURGERY_CENTER): Payer: No Typology Code available for payment source | Admitting: *Deleted

## 2010-10-11 VITALS — Ht 68.0 in | Wt 200.7 lb

## 2010-10-11 DIAGNOSIS — Z86010 Personal history of colonic polyps: Secondary | ICD-10-CM

## 2010-10-11 MED ORDER — PEG-KCL-NACL-NASULF-NA ASC-C 100 G PO SOLR: 1.0000 | Freq: Once | ORAL | Status: DC

## 2010-10-11 NOTE — Progress Notes (Signed)
Pt will bring medication bottles the day of procedure to verify; he couldn't remember all his meds at his previsit

## 2010-10-13 ENCOUNTER — Ambulatory Visit: Payer: No Typology Code available for payment source | Admitting: Internal Medicine

## 2010-10-14 ENCOUNTER — Encounter: Payer: Self-pay | Admitting: Internal Medicine

## 2010-10-14 ENCOUNTER — Ambulatory Visit (INDEPENDENT_AMBULATORY_CARE_PROVIDER_SITE_OTHER): Payer: No Typology Code available for payment source | Admitting: Internal Medicine

## 2010-10-14 DIAGNOSIS — E785 Hyperlipidemia, unspecified: Secondary | ICD-10-CM

## 2010-10-14 DIAGNOSIS — I10 Essential (primary) hypertension: Secondary | ICD-10-CM

## 2010-10-14 DIAGNOSIS — E119 Type 2 diabetes mellitus without complications: Secondary | ICD-10-CM

## 2010-10-14 DIAGNOSIS — J069 Acute upper respiratory infection, unspecified: Secondary | ICD-10-CM

## 2010-10-14 NOTE — Patient Instructions (Signed)
Please schedule cbc, chem7, a1c, urine microalbumin (250.0) and lipid (272.4) prior to next visit

## 2010-10-17 DIAGNOSIS — J069 Acute upper respiratory infection, unspecified: Secondary | ICD-10-CM | POA: Insufficient documentation

## 2010-10-17 NOTE — Assessment & Plan Note (Signed)
Current good control without Januvia. continue current regimen.

## 2010-10-17 NOTE — Assessment & Plan Note (Signed)
suboptimal control however recent URI coinciding. Continue current regimen with statin therapy and repeat fasting lipid profile with next visit. If remains suboptimal titrate statin dose

## 2010-10-17 NOTE — Progress Notes (Signed)
  Subjective:    [name], male    DOB: 07-Jan-1941, 70 y.o.   MRN: [id number]  HPI Patient presents to clinic for evaluation of multiple medical problems. Blood pressure minimally elevated but has had upper respiratory infection symptoms for approximately 3 days including nonproductive cough without fever chills or shortness of breath. Diabetes reviewed under good control with A1c of 6.5. Home blood sugars approximately 125 recently without hypoglycemia. Tolerate statin therapy without myalgias or abnormal LFTs. LDL recently above goal. Reviewed all medications in detail as he is not taking Proscar Neurontin or Januvia. No other complaints  Reviewed past medical history, medications and allergies    Review of Systemsee history of present illness     Objective:   Physical Exam    Physical Exam  Vitals reviewed. Constitutional:  appears well-developed and well-nourished. No distress.  HENT:  Head: Normocephalic and atraumatic.  Right Ear: Tympanic membrane, external ear and ear canal normal.  Left Ear: Tympanic membrane, external ear and ear canal normal.  Nose: Nose normal.  Mouth/Throat: Oropharynx is clear and moist. No oropharyngeal exudate.  Eyes: Conjunctivae and EOM are normal. Pupils are equal, round, and reactive to light. Right eye exhibits no discharge. Left eye exhibits no discharge. No scleral icterus.  Neck: Neck supple. No thyromegaly present.  Cardiovascular: Normal rate, regular rhythm and normal heart sounds.  Exam reveals no gallop and no friction rub.   No murmur heard. Pulmonary/Chest: Effort normal and breath sounds normal. No respiratory distress.  has no wheezes.  has no rales.  Lymphadenopathy:   no cervical adenopathy.  Neurological:  is alert.  Skin: Skin is warm and dry.  not diaphoretic.  Psychiatric: normal mood and affect.      Assessment & Plan:

## 2010-10-17 NOTE — Assessment & Plan Note (Signed)
Likely viral etiology. Followup if no improvement after overall duration of 8-10 days.

## 2010-10-17 NOTE — Assessment & Plan Note (Signed)
Mildly suboptimal control however coinciding URI. Recommend continuing current regimen and monitor blood pressure as an outpatient

## 2010-10-25 ENCOUNTER — Ambulatory Visit (AMBULATORY_SURGERY_CENTER): Payer: No Typology Code available for payment source | Admitting: Gastroenterology

## 2010-10-25 ENCOUNTER — Encounter: Payer: Self-pay | Admitting: Gastroenterology

## 2010-10-25 DIAGNOSIS — Z1211 Encounter for screening for malignant neoplasm of colon: Secondary | ICD-10-CM

## 2010-10-25 DIAGNOSIS — D126 Benign neoplasm of colon, unspecified: Secondary | ICD-10-CM

## 2010-10-25 DIAGNOSIS — Z860100 Personal history of colon polyps, unspecified: Secondary | ICD-10-CM

## 2010-10-25 LAB — GLUCOSE, CAPILLARY

## 2010-10-25 MED ORDER — SODIUM CHLORIDE 0.9 % IV SOLN: 500.0000 mL | INTRAVENOUS | Status: DC

## 2010-10-25 NOTE — Patient Instructions (Signed)
Please read the handout given to you by your recovery room nurse.   Your blood sugar was 96 in recovery.   Please eat something when you get home, and take your routine medications today.  Your biopsy results will be mailed to you within 2 weeks.   If you have any questions or concerns, please call us at [phone].  Thank-you.

## 2010-10-25 NOTE — Progress Notes (Signed)
1032-Right hand IV infiltrated once in procedure room and 2 mg versed administered. IV d/c'd. Left Wrist IV attempted by R Meyers,RN,  unsuccessful. Left Hand IV attempted by N. Hill, RN, unsuccessful. Right hand 22g attempted, successful by Meliton Rattan, RN. Total of 5 IV attempts.

## 2010-10-26 ENCOUNTER — Telehealth: Payer: Self-pay | Admitting: *Deleted

## 2010-10-26 NOTE — Telephone Encounter (Signed)

## 2010-11-01 ENCOUNTER — Encounter: Payer: Self-pay | Admitting: Gastroenterology

## 2011-01-10 ENCOUNTER — Ambulatory Visit (INDEPENDENT_AMBULATORY_CARE_PROVIDER_SITE_OTHER): Payer: No Typology Code available for payment source | Admitting: Internal Medicine

## 2011-01-10 ENCOUNTER — Telehealth: Payer: Self-pay | Admitting: Internal Medicine

## 2011-01-10 ENCOUNTER — Encounter: Payer: Self-pay | Admitting: Internal Medicine

## 2011-01-10 DIAGNOSIS — E119 Type 2 diabetes mellitus without complications: Secondary | ICD-10-CM

## 2011-01-10 DIAGNOSIS — Z23 Encounter for immunization: Secondary | ICD-10-CM

## 2011-01-10 DIAGNOSIS — M25519 Pain in unspecified shoulder: Secondary | ICD-10-CM

## 2011-01-10 DIAGNOSIS — E785 Hyperlipidemia, unspecified: Secondary | ICD-10-CM

## 2011-01-10 DIAGNOSIS — M25512 Pain in left shoulder: Secondary | ICD-10-CM

## 2011-01-10 LAB — BASIC METABOLIC PANEL
BUN: 13 mg/dL (ref 6–23)
Creat: 1.01 mg/dL (ref 0.50–1.35)
Glucose, Bld: 111 mg/dL — ABNORMAL HIGH (ref 70–99)

## 2011-01-10 LAB — HEMOGLOBIN A1C
Hgb A1c MFr Bld: 6.3 % — ABNORMAL HIGH (ref <5.7)
Mean Plasma Glucose: 134 mg/dL — ABNORMAL HIGH (ref <117)

## 2011-01-10 LAB — LIPID PANEL
Cholesterol: 128 mg/dL (ref 0–200)
HDL: 37 mg/dL — ABNORMAL LOW (ref >39)
Total CHOL/HDL Ratio: 3.5 Ratio
Triglycerides: 74 mg/dL (ref <150)
VLDL: 15 mg/dL (ref 0–40)

## 2011-01-10 MED ORDER — DICLOFENAC SODIUM 75 MG PO TBEC: DELAYED_RELEASE_TABLET | ORAL | Status: DC

## 2011-01-10 NOTE — Patient Instructions (Signed)
Please schedule cbc, chem7, a1c 250.0 and lipid/lft 272.4 prior to next appointment

## 2011-01-10 NOTE — Telephone Encounter (Signed)
Lab orders entered for [facility] for the last week in February 2013.

## 2011-01-10 NOTE — Progress Notes (Signed)
  Subjective:    [name], male    DOB: June 13, 1940, 70 y.o.   MRN: [id number]  HPI Pt presents to clinic for followup of multiple medical problems. Notes left shoulder pain radiating to mid elbow/arm without paresthesias, weakness or injury. Worsens with shoulder movement. No neck pain and has FROM of arm and neck. Tolerates statin tx without myalgias or abnormal lft. Reports fsbs range 115-139 without hypoglycemia. No other complaints.  Past Medical History  Diagnosis Date  . Diabetes mellitus     Type II  . Hypertension   . BPH (benign prostatic hyperplasia)   . Carbuncle     recurrent MRSA carbuncles  . Arthritis   . Cataract   . Glaucoma   . Hyperlipidemia   . Disk prolapse    Past Surgical History  Procedure Date  . Cataract extraction     x 2  . Colonoscopy   . Polypectomy     reports that he has quit smoking. He has never used smokeless tobacco. He reports that he does not drink alcohol or use illicit drugs. family history includes Cancer in his mother.  There is no history of Colon cancer. No Known Allergies   Review of Systems see hpi     Objective:   Physical Exam  Nursing note and vitals reviewed. Constitutional: He appears well-developed and well-nourished. No distress.  HENT:  Head: Normocephalic and atraumatic.  Right Ear: External ear normal.  Left Ear: External ear normal.  Eyes: Conjunctivae are normal. No scleral icterus.  Neck: Carotid bruit is not present.  Cardiovascular: Normal rate, regular rhythm and normal heart sounds.  Exam reveals no gallop and no friction rub.   No murmur heard. Pulmonary/Chest: Effort normal and breath sounds normal. No respiratory distress. He has no wheezes. He has no rales.  Neurological: He is alert.  Skin: He is not diaphoretic.          Assessment & Plan:

## 2011-01-11 ENCOUNTER — Telehealth: Payer: Self-pay | Admitting: *Deleted

## 2011-01-11 DIAGNOSIS — M25512 Pain in left shoulder: Secondary | ICD-10-CM | POA: Insufficient documentation

## 2011-01-11 MED ORDER — PRAVASTATIN SODIUM 40 MG PO TABS: 40.0000 mg | ORAL_TABLET | Freq: Every day | ORAL | Status: DC

## 2011-01-11 MED ORDER — TERAZOSIN HCL 2 MG PO CAPS: 2.0000 mg | ORAL_CAPSULE | Freq: Every day | ORAL | Status: DC

## 2011-01-11 MED ORDER — METFORMIN HCL 500 MG PO TABS: 500.0000 mg | ORAL_TABLET | Freq: Two times a day (BID) | ORAL | Status: DC

## 2011-01-11 NOTE — Assessment & Plan Note (Signed)
Obtain lipid profile. 

## 2011-01-11 NOTE — Assessment & Plan Note (Signed)
Obtain chem7, a1c and urine microalbumin 

## 2011-01-11 NOTE — Telephone Encounter (Signed)
Rx refills sent to [facility].

## 2011-01-11 NOTE — Telephone Encounter (Signed)
Patient called and left voice message updating his medication list.  His message stating that he is taking 81 aspirin, Pravastatin 40 mg, terazosin 2 mg ,and metformin 500 mg. He is requesting refills to [facility].  Dr Rodena Medin is it okay to submit the above medications to mail order for 90 day supply?

## 2011-01-11 NOTE — Telephone Encounter (Signed)
Yes pls

## 2011-01-11 NOTE — Assessment & Plan Note (Signed)
Attempt course of voltaren to be taken with food and no other nsaid. Followup if no improvement or worsening.

## 2011-02-18 ENCOUNTER — Telehealth: Payer: Self-pay | Admitting: *Deleted

## 2011-02-18 NOTE — Telephone Encounter (Signed)
Patient called and left voice message requesting a return phone call; purpose of call was unstated.

## 2011-02-22 NOTE — Telephone Encounter (Signed)
Call returned to patient at [phone]. He stated he has requested a ED device from [employer]

## 2011-02-22 NOTE — Telephone Encounter (Signed)
Patient was informed prior to approval of device he would need to discuss this with Dr Rodena Medin. Patient has scheduled appointment for November 29 @ 8:30 am.

## 2011-03-03 ENCOUNTER — Encounter: Payer: Self-pay | Admitting: Internal Medicine

## 2011-03-03 ENCOUNTER — Ambulatory Visit (INDEPENDENT_AMBULATORY_CARE_PROVIDER_SITE_OTHER): Payer: No Typology Code available for payment source | Admitting: Internal Medicine

## 2011-03-03 DIAGNOSIS — M79602 Pain in left arm: Secondary | ICD-10-CM

## 2011-03-03 DIAGNOSIS — F528 Other sexual dysfunction not due to a substance or known physiological condition: Secondary | ICD-10-CM

## 2011-03-03 DIAGNOSIS — M79609 Pain in unspecified limb: Secondary | ICD-10-CM

## 2011-03-03 MED ORDER — METHYLPREDNISOLONE (PAK) 4 MG PO TABS: 4.0000 mg | ORAL_TABLET | Freq: Every day | ORAL | Status: DC

## 2011-03-05 NOTE — Assessment & Plan Note (Signed)
Attempt vacuum device

## 2011-03-05 NOTE — Assessment & Plan Note (Signed)
Possible radicular component. Pt defers imaging currently. Attempt medrol dosepak. Followup if no improvement or worsening.

## 2011-03-05 NOTE — Progress Notes (Signed)
  Subjective:    [name], male    DOB: 10-01-1940, 70 y.o.   MRN: [id number]  HPI Pt presents to clinic for followup of multiple medical problems. Received paperwork request for authorization of ED vacuum device. Pt states company contacted him but he is interested in attempting. Does suffer from ED with h/o DM/HTN. Has persistent left arm pain s/p voltaren without significant improvement. Had mild leg rash after voltaren which resolved after stopping medication. Appears to be radicular component of arm pain but without neck pain, arm weakness or paresthesia. No other complaints.  Past Medical History  Diagnosis Date  . Diabetes mellitus     Type II  . Hypertension   . BPH (benign prostatic hyperplasia)   . Carbuncle     recurrent MRSA carbuncles  . Arthritis   . Cataract   . Glaucoma   . Hyperlipidemia   . Disk prolapse    Past Surgical History  Procedure Date  . Cataract extraction     x 2  . Colonoscopy   . Polypectomy     reports that he has quit smoking. He has never used smokeless tobacco. He reports that he does not drink alcohol or use illicit drugs. family history includes Cancer in his mother.  There is no history of Colon cancer. No Known Allergies   Review of Systems     Objective:   Physical Exam  Nursing note and vitals reviewed. Constitutional: He appears well-developed and well-nourished. No distress.  HENT:  Head: Normocephalic and atraumatic.  Musculoskeletal:       Left NWG:NFAO. Shoulder NT. Strength grossly intact.  Neurological: He is alert.  Skin: Skin is warm and dry. He is not diaphoretic.  Psychiatric: He has a normal mood and affect.          Assessment & Plan:

## 2011-06-06 NOTE — Telephone Encounter (Signed)
Addended by: Mervin Kung A on: 06/06/2011 10:49 AM   Modules accepted: Orders

## 2011-06-07 ENCOUNTER — Ambulatory Visit (INDEPENDENT_AMBULATORY_CARE_PROVIDER_SITE_OTHER): Payer: No Typology Code available for payment source | Admitting: Internal Medicine

## 2011-06-07 ENCOUNTER — Telehealth: Payer: Self-pay | Admitting: Internal Medicine

## 2011-06-07 ENCOUNTER — Encounter: Payer: Self-pay | Admitting: Internal Medicine

## 2011-06-07 DIAGNOSIS — L608 Other nail disorders: Secondary | ICD-10-CM

## 2011-06-07 DIAGNOSIS — L603 Nail dystrophy: Secondary | ICD-10-CM

## 2011-06-07 DIAGNOSIS — D72819 Decreased white blood cell count, unspecified: Secondary | ICD-10-CM

## 2011-06-07 DIAGNOSIS — M79602 Pain in left arm: Secondary | ICD-10-CM

## 2011-06-07 DIAGNOSIS — E119 Type 2 diabetes mellitus without complications: Secondary | ICD-10-CM

## 2011-06-07 DIAGNOSIS — M79609 Pain in unspecified limb: Secondary | ICD-10-CM

## 2011-06-07 LAB — CBC
HCT: 37.4 % — ABNORMAL LOW (ref 39.0–52.0)
MCV: 84.8 fL (ref 78.0–100.0)
Platelets: 173 10*3/uL (ref 150–400)
RBC: 4.41 MIL/uL (ref 4.22–5.81)
WBC: 2.3 10*3/uL — ABNORMAL LOW (ref 4.0–10.5)

## 2011-06-07 LAB — BASIC METABOLIC PANEL
CO2: 27 mEq/L (ref 19–32)
Chloride: 105 mEq/L (ref 96–112)
Creat: 1.07 mg/dL (ref 0.50–1.35)

## 2011-06-07 LAB — LIPID PANEL
HDL: 34 mg/dL — ABNORMAL LOW (ref >39)
LDL Cholesterol: 75 mg/dL (ref 0–99)
Total CHOL/HDL Ratio: 3.8 Ratio
VLDL: 19 mg/dL (ref 0–40)

## 2011-06-07 LAB — HEPATIC FUNCTION PANEL
Albumin: 4.1 g/dL (ref 3.5–5.2)
Total Bilirubin: 0.5 mg/dL (ref 0.3–1.2)

## 2011-06-07 NOTE — Telephone Encounter (Signed)
Lab orders entered for June 2013. 

## 2011-06-07 NOTE — Progress Notes (Signed)
  Subjective:    [name], male    DOB: 1941/03/14, 71 y.o.   MRN: [id number]  HPI Pt presents to clinic for followup of multiple medical problems. Reviewed decreasing wbc without recent infections. Left arm/shoulder pain mildy improved but persistent s/p voltaren then medrol. Seems to describe possible radicular pain to left arm without weakness or paresthesia.  Past Medical History  Diagnosis Date  . Diabetes mellitus     Type II  . Hypertension   . BPH (benign prostatic hyperplasia)   . Carbuncle     recurrent MRSA carbuncles  . Arthritis   . Cataract   . Glaucoma   . Hyperlipidemia   . Disk prolapse    Past Surgical History  Procedure Date  . Cataract extraction     x 2  . Colonoscopy   . Polypectomy     reports that he has quit smoking. He has never used smokeless tobacco. He reports that he does not drink alcohol or use illicit drugs. family history includes Cancer in his mother.  There is no history of Colon cancer. No Known Allergies    Review of Systems see hpi     Objective:   Physical Exam  Physical Exam  Nursing note and vitals reviewed. Constitutional: Appears well-developed and well-nourished. No distress.  HENT:  Head: Normocephalic and atraumatic.  Right Ear: External ear normal.  Left Ear: External ear normal.  Eyes: Conjunctivae are normal. No scleral icterus.  Neck: Neck supple. Carotid bruit is not present.  Cardiovascular: Normal rate, regular rhythm and normal heart sounds.  Exam reveals no gallop and no friction rub.   No murmur heard. Pulmonary/Chest: Effort normal and breath sounds normal. No respiratory distress. He has no wheezes. no rales.  Lymphadenopathy:    He has no cervical adenopathy.  Neurological:Alert.  Skin: Skin is warm and dry. Not diaphoretic.  Psychiatric: Has a normal mood and affect.  Diabetic foot exam: +2 DP pulses, no diabetic wounds, ulcerations or significant callousing. Monofilament exam nl.  +dystrophic nails      Assessment & Plan:

## 2011-06-07 NOTE — Patient Instructions (Signed)
Please return to lab in one week for cbc with diff (leukopenia) You may want to purchase an antihistamine from the drugstore for your drainage/congestion (claritin, zyrtec or allegra) Please schedule labs prior to next visit- cbc, chem7, a1c 250.0

## 2011-06-11 DIAGNOSIS — D72819 Decreased white blood cell count, unspecified: Secondary | ICD-10-CM | POA: Insufficient documentation

## 2011-06-11 DIAGNOSIS — L603 Nail dystrophy: Secondary | ICD-10-CM | POA: Insufficient documentation

## 2011-06-11 NOTE — Assessment & Plan Note (Signed)
Declines podiatry referral 

## 2011-06-11 NOTE — Assessment & Plan Note (Signed)
Asx. Repeat cbc with diff in one week

## 2011-06-11 NOTE — Assessment & Plan Note (Signed)
Pt defers imaging or referral

## 2011-09-08 ENCOUNTER — Ambulatory Visit (INDEPENDENT_AMBULATORY_CARE_PROVIDER_SITE_OTHER): Payer: No Typology Code available for payment source | Admitting: Internal Medicine

## 2011-09-08 ENCOUNTER — Encounter: Payer: Self-pay | Admitting: Internal Medicine

## 2011-09-08 ENCOUNTER — Telehealth: Payer: Self-pay | Admitting: Internal Medicine

## 2011-09-08 VITALS — BP 114/80 | HR 58 | Temp 98.0°F | Resp 16 | Ht 69.0 in | Wt 204.0 lb

## 2011-09-08 DIAGNOSIS — M79602 Pain in left arm: Secondary | ICD-10-CM

## 2011-09-08 DIAGNOSIS — M25519 Pain in unspecified shoulder: Secondary | ICD-10-CM

## 2011-09-08 DIAGNOSIS — I10 Essential (primary) hypertension: Secondary | ICD-10-CM

## 2011-09-08 DIAGNOSIS — M25512 Pain in left shoulder: Secondary | ICD-10-CM

## 2011-09-08 DIAGNOSIS — E785 Hyperlipidemia, unspecified: Secondary | ICD-10-CM

## 2011-09-08 DIAGNOSIS — E119 Type 2 diabetes mellitus without complications: Secondary | ICD-10-CM

## 2011-09-08 DIAGNOSIS — M79609 Pain in unspecified limb: Secondary | ICD-10-CM

## 2011-09-08 DIAGNOSIS — D72819 Decreased white blood cell count, unspecified: Secondary | ICD-10-CM

## 2011-09-08 LAB — CBC
HCT: 36.9 % — ABNORMAL LOW (ref 39.0–52.0)
Hemoglobin: 12.5 g/dL — ABNORMAL LOW (ref 13.0–17.0)
RBC: 4.43 MIL/uL (ref 4.22–5.81)
WBC: 2.9 10*3/uL — ABNORMAL LOW (ref 4.0–10.5)

## 2011-09-08 LAB — BASIC METABOLIC PANEL
BUN: 19 mg/dL (ref 6–23)
Chloride: 107 mEq/L (ref 96–112)
Glucose, Bld: 108 mg/dL — ABNORMAL HIGH (ref 70–99)
Potassium: 4.4 mEq/L (ref 3.5–5.3)

## 2011-09-08 LAB — HEMOGLOBIN A1C: Mean Plasma Glucose: 128 mg/dL — ABNORMAL HIGH (ref <117)

## 2011-09-08 NOTE — Telephone Encounter (Signed)
Please schedule fasting labs prior to next visit  Chem7, a1c-250 and lipid/lft 272.4   Patient has upcoming appointment on 01/11/12. Patient will be going to [facility] lab.

## 2011-09-08 NOTE — Patient Instructions (Signed)
Please schedule fasting labs prior to next visit Chem7, a1c-250 and lipid/lft 272.4

## 2011-09-08 NOTE — Assessment & Plan Note (Signed)
Obtain CBC 

## 2011-09-08 NOTE — Assessment & Plan Note (Signed)
Obtain chem7 and a1c 

## 2011-09-08 NOTE — Telephone Encounter (Signed)
Lab orders entered for October 2013. 

## 2011-09-08 NOTE — Assessment & Plan Note (Signed)
Normotensive and stable. Continue current regimen. Monitor bp as outpt and followup in clinic as scheduled.  

## 2011-09-08 NOTE — Progress Notes (Signed)
  Subjective:    [name], male    DOB: 11-Oct-1940, 71 y.o.   MRN: [id number]  HPI Pt presents to clinic for followup of multiple medical problems. Continue to have chronic intermittent pain of left shoulder with question of mild radicular component and possible intermittent positional neck pain. S/p course of voltaren then medrol dosepak without improvement. Previously deferred imaging. Reviewed h/o leukopenia with no recurrent infections. Home fsbs ~avg 118. BP reviewed normotensive.  Past Medical History  Diagnosis Date  . Diabetes mellitus     Type II  . Hypertension   . BPH (benign prostatic hyperplasia)   . Carbuncle     recurrent MRSA carbuncles  . Arthritis   . Cataract   . Glaucoma   . Hyperlipidemia   . Disk prolapse    Past Surgical History  Procedure Date  . Cataract extraction     x 2  . Colonoscopy   . Polypectomy     reports that he has quit smoking. He has never used smokeless tobacco. He reports that he does not drink alcohol or use illicit drugs. family history includes Cancer in his mother.  There is no history of Colon cancer. No Known Allergies    Review of Systems see hpi     Objective:   Physical Exam  Physical Exam  Nursing note and vitals reviewed. Constitutional: Appears well-developed and well-nourished. No distress.  HENT:  Head: Normocephalic and atraumatic.  Right Ear: External ear normal.  Left Ear: External ear normal.  Eyes: Conjunctivae are normal. No scleral icterus.  Neck: Neck supple. Carotid bruit is not present.  Cardiovascular: Normal rate, regular rhythm and normal heart sounds.  Exam reveals no gallop and no friction rub.   No murmur heard. Pulmonary/Chest: Effort normal and breath sounds normal. No respiratory distress. He has no wheezes. no rales.  Lymphadenopathy:    He has no cervical adenopathy.  Neurological:Alert.  Skin: Skin is warm and dry. Not diaphoretic.  Psychiatric: Has a normal mood and  affect.        Assessment & Plan:

## 2011-09-08 NOTE — Assessment & Plan Note (Signed)
Chronic persistent. ?shoulder vs radicular pain. Sports medicine consult

## 2011-09-12 ENCOUNTER — Ambulatory Visit (INDEPENDENT_AMBULATORY_CARE_PROVIDER_SITE_OTHER): Payer: No Typology Code available for payment source | Admitting: Family Medicine

## 2011-09-12 ENCOUNTER — Encounter: Payer: Self-pay | Admitting: Family Medicine

## 2011-09-12 VITALS — BP 142/89 | HR 58 | Temp 98.0°F | Ht 68.0 in | Wt 204.0 lb

## 2011-09-12 DIAGNOSIS — M542 Cervicalgia: Secondary | ICD-10-CM | POA: Insufficient documentation

## 2011-09-12 NOTE — Assessment & Plan Note (Signed)
History and exam consistent with cervical radiculopathy.  No evidence of shoulder pathology on exam. Has not tried physical therapy - will start this today.  Has already tried medrol dose pack - will not repeat this.  Take aleve as needed, heat, discussed ergonomic issues to prevent traction on cervical nerves.  Consider cervical collar.  If not improving will consider radiographs, MRI.

## 2011-09-12 NOTE — Patient Instructions (Signed)
You have cervical radiculopathy (a pinched nerve in the neck). You have already tried the prednisone - I would not repeat this. Try aleve 2 tabs twice a day with food for pain and inflammation. Consider cervical collar to prevent repetitive neck flexion pulling traction on the nerves. Start physical therapy for stretching, exercises, traction, and modalities - do home exercises on days you do not go there. Heat 15 minutes at a time 3-4 times a day to help with spasms. Watch head position when on computers, texting, when sleeping in bed - should in line with back to prevent further nerve traction and irritation. Consider home traction unit if you get benefit with this in physical therapy. If not improving we will consider x-rays and MRI. Follow up with me in 5-6 weeks.

## 2011-09-12 NOTE — Progress Notes (Signed)
Subjective:    [name], male    DOB: 05/25/1940, 71 y.o.   MRN: [id number]  PCP: Dr. Rodena Medin  HPI 71 yo M here for left shoulder pain.  Patient denies known injury. States for over 6 months has had left posterior shoulder pain and neck pain. Radiates down left arm past elbow. Associated with tingling. No bowel or bladder dysfunction. No prior injuries or surgeries. No prior workup of neck or shoulder. No night pain. Tried medrol dose pack without much help.  No other treatments tried.  Past Medical History  Diagnosis Date  . Diabetes mellitus     Type II  . Hypertension   . BPH (benign prostatic hyperplasia)   . Carbuncle     recurrent MRSA carbuncles  . Arthritis   . Cataract   . Glaucoma   . Hyperlipidemia   . Disk prolapse     Current Outpatient Prescriptions on File Prior to Visit  Medication Sig Dispense Refill  . aspirin 81 MG tablet Take 81 mg by mouth daily.       Marland Kitchen glucose blood (ONE TOUCH TEST STRIPS) test strip 1 each by Other route as needed. Use as instructed to check blood sugar once daily (dx 250.00)      . metFORMIN (GLUCOPHAGE) 500 MG tablet Take 1 tablet (500 mg total) by mouth 2 (two) times daily with a meal.  180 tablet  1  . ONE TOUCH LANCETS MISC by Does not apply route. Use to check blood sugar once daily (dx code 250.00)      . pravastatin (PRAVACHOL) 40 MG tablet Take 1 tablet (40 mg total) by mouth daily.  90 tablet  1  . terazosin (HYTRIN) 2 MG capsule Take 1 capsule (2 mg total) by mouth at bedtime.  90 capsule  1   Current Facility-Administered Medications on File Prior to Visit  Medication Dose Route Frequency Provider Last Rate Last Dose  . 0.9 %  sodium chloride infusion  500 mL Intravenous Continuous Mardella Layman, MD        Past Surgical History  Procedure Date  . Cataract extraction     x 2  . Colonoscopy   . Polypectomy     No Known Allergies  History   Social History  . Marital Status: Married    Spouse Name: N/A    Number of Children: N/A  . Years of Education: N/A   Occupational History  . Not on file.   Social History Main Topics  . Smoking status: Former Games developer  . Smokeless tobacco: Never Used  . Alcohol Use: No  . Drug Use: No  . Sexually Active: Not on file   Other Topics Concern  . Not on file   Social History Narrative   PastorMarried Former Smoker   Alcohol use-no     Family History  Problem Relation Age of Onset  . Cancer Mother   . Hypertension Mother   . Colon cancer Neg Hx   . Heart attack Neg Hx   . Hyperlipidemia Neg Hx   . Diabetes Neg Hx   . Sudden death Neg Hx     BP 142/89  Pulse 58  Temp(Src) 98 F (36.7 C) (Oral)  Ht 5\' 8"  (1.727 m)  Wt 204 lb (92.534 kg)  BMI 31.02 kg/m2   Review of Systems See HPI above.    Objective:   Physical Exam Gen: NAD  Neck: No gross deformity, swelling, bruising.  Spasms left  cervical paraspinal region. TTP left paraspinal region.  No midline/bony TTP. FROM neck - pain in left paraspinal muscles primarily with right lateral rotation. BUE strength 5/5.   Sensation intact to light touch.   2+ equal reflexes in bilateral biceps, trace in bilateral triceps and brachioradialis tendons. Negative spurlings. NV intact distal BUEs.   L shoulder: No swelling, ecchymoses.  No gross deformity. No TTP. FROM. Negative Hawkins, Neers. Negative Speeds, Yergasons. Strength 5/5 with empty can and resisted internal/external rotation. Negative apprehension. NV intact distally.     Assessment & Plan:  1. Left neck pain - History and exam consistent with cervical radiculopathy.  No evidence of shoulder pathology on exam. Has not tried physical therapy - will start this today.  Has already tried medrol dose pack - will not repeat this.  Take aleve as needed, heat, discussed ergonomic issues to prevent traction on cervical nerves.  Consider cervical collar.  If not improving will consider radiographs, MRI.

## 2011-09-21 ENCOUNTER — Ambulatory Visit: Payer: No Typology Code available for payment source | Attending: Family Medicine | Admitting: Physical Therapy

## 2011-09-21 DIAGNOSIS — IMO0001 Reserved for inherently not codable concepts without codable children: Secondary | ICD-10-CM | POA: Insufficient documentation

## 2011-09-21 DIAGNOSIS — M25519 Pain in unspecified shoulder: Secondary | ICD-10-CM | POA: Insufficient documentation

## 2011-09-21 DIAGNOSIS — M542 Cervicalgia: Secondary | ICD-10-CM | POA: Insufficient documentation

## 2011-09-23 ENCOUNTER — Other Ambulatory Visit: Payer: Self-pay | Admitting: Internal Medicine

## 2011-09-23 NOTE — Telephone Encounter (Signed)
Call placed to patient at [phone], he has stated he is due refills and has requested refills to [facility]. He was informed Rx would be sent. No further action required.

## 2011-09-23 NOTE — Telephone Encounter (Signed)
Patient states that he did not request refills from [facility] but he is due to have those medication refilled. He states that [facility] usually sends him 90 days supply.  Best # [id number] or [phone]

## 2011-09-23 NOTE — Telephone Encounter (Signed)
Call placed to patient at [phone], patients wife Jonathan Lewis stated patient was not available. A message was left for patient to return call to verify if he has requested medication refills from [facility]. Jonathan Lewis stated that she will relay the information.

## 2011-09-27 ENCOUNTER — Ambulatory Visit: Payer: No Typology Code available for payment source | Admitting: Physical Therapy

## 2011-09-29 ENCOUNTER — Ambulatory Visit: Payer: No Typology Code available for payment source | Admitting: Physical Therapy

## 2011-10-03 ENCOUNTER — Ambulatory Visit: Payer: No Typology Code available for payment source | Attending: Family Medicine | Admitting: Physical Therapy

## 2011-10-03 DIAGNOSIS — IMO0001 Reserved for inherently not codable concepts without codable children: Secondary | ICD-10-CM | POA: Insufficient documentation

## 2011-10-03 DIAGNOSIS — M25519 Pain in unspecified shoulder: Secondary | ICD-10-CM | POA: Insufficient documentation

## 2011-10-03 DIAGNOSIS — M542 Cervicalgia: Secondary | ICD-10-CM | POA: Insufficient documentation

## 2011-10-05 ENCOUNTER — Ambulatory Visit: Payer: No Typology Code available for payment source | Admitting: Physical Therapy

## 2011-10-11 ENCOUNTER — Ambulatory Visit: Payer: No Typology Code available for payment source | Admitting: Physical Therapy

## 2011-10-13 ENCOUNTER — Ambulatory Visit: Payer: No Typology Code available for payment source | Admitting: Physical Therapy

## 2011-10-17 ENCOUNTER — Ambulatory Visit: Payer: No Typology Code available for payment source | Admitting: Family Medicine

## 2011-10-18 ENCOUNTER — Ambulatory Visit: Payer: No Typology Code available for payment source | Admitting: Physical Therapy

## 2011-10-20 ENCOUNTER — Ambulatory Visit: Payer: No Typology Code available for payment source | Admitting: Physical Therapy

## 2012-01-10 LAB — HEPATIC FUNCTION PANEL
ALT: 14 U/L (ref 0–53)
AST: 19 U/L (ref 0–37)
Bilirubin, Direct: 0.1 mg/dL (ref 0.0–0.3)
Indirect Bilirubin: 0.3 mg/dL (ref 0.0–0.9)
Total Bilirubin: 0.4 mg/dL (ref 0.3–1.2)

## 2012-01-10 LAB — BASIC METABOLIC PANEL
BUN: 12 mg/dL (ref 6–23)
Calcium: 9.2 mg/dL (ref 8.4–10.5)
Glucose, Bld: 105 mg/dL — ABNORMAL HIGH (ref 70–99)

## 2012-01-10 LAB — LIPID PANEL: Cholesterol: 153 mg/dL (ref 0–200)

## 2012-01-10 LAB — HEMOGLOBIN A1C: Mean Plasma Glucose: 137 mg/dL — ABNORMAL HIGH (ref <117)

## 2012-01-10 NOTE — Telephone Encounter (Signed)
Pt presented to the lab and orders were released.

## 2012-01-10 NOTE — Addendum Note (Signed)
Addended by: Mervin Kung A on: 01/10/2012 02:58 PM   Modules accepted: Orders

## 2012-01-11 ENCOUNTER — Telehealth: Payer: Self-pay | Admitting: Internal Medicine

## 2012-01-11 ENCOUNTER — Encounter: Payer: Self-pay | Admitting: Internal Medicine

## 2012-01-11 ENCOUNTER — Ambulatory Visit (INDEPENDENT_AMBULATORY_CARE_PROVIDER_SITE_OTHER): Payer: No Typology Code available for payment source | Admitting: Internal Medicine

## 2012-01-11 VITALS — BP 132/74 | HR 60 | Temp 98.1°F | Wt 207.0 lb

## 2012-01-11 DIAGNOSIS — Z79899 Other long term (current) drug therapy: Secondary | ICD-10-CM

## 2012-01-11 DIAGNOSIS — E785 Hyperlipidemia, unspecified: Secondary | ICD-10-CM

## 2012-01-11 DIAGNOSIS — M5412 Radiculopathy, cervical region: Secondary | ICD-10-CM

## 2012-01-11 DIAGNOSIS — I10 Essential (primary) hypertension: Secondary | ICD-10-CM

## 2012-01-11 DIAGNOSIS — Z23 Encounter for immunization: Secondary | ICD-10-CM

## 2012-01-11 DIAGNOSIS — M79602 Pain in left arm: Secondary | ICD-10-CM

## 2012-01-11 DIAGNOSIS — M79609 Pain in unspecified limb: Secondary | ICD-10-CM

## 2012-01-11 DIAGNOSIS — E119 Type 2 diabetes mellitus without complications: Secondary | ICD-10-CM

## 2012-01-11 DIAGNOSIS — K5900 Constipation, unspecified: Secondary | ICD-10-CM

## 2012-01-11 NOTE — Patient Instructions (Signed)
We are in the process of scheduling a MRI of your neck.  Please take benefiber daily and colace stool softener twice a day. Both are available over the counter without a prescription. Please schedule labs prior to next visit -cbc, chem7, a1c, urine microalbumin and tsh-250.00

## 2012-01-21 DIAGNOSIS — K5900 Constipation, unspecified: Secondary | ICD-10-CM | POA: Insufficient documentation

## 2012-01-21 NOTE — Assessment & Plan Note (Signed)
Attempt MiraLax when necessary and Colace twice a day. Followup if no improvement or worsening

## 2012-01-21 NOTE — Assessment & Plan Note (Signed)
HDL low. Advised may improve with exercise.

## 2012-01-21 NOTE — Progress Notes (Signed)
  Subjective:    [name], male    DOB: 1941-02-04, 71 y.o.   MRN: [id number]  HPI patient presents to clinic for followup of arm pain. Notes continued left posterior scapular neck and arm pain. Status post sports medicine evaluation in June. Felt to represent cervical radiculopathy. Status post physical therapy and steroids. She needs to have radiating pain without weakness. No improvement. Also notes approximately 2 week history of constipation lasting several days intermittently. Has no abdominal pain. No nausea vomiting or blood in stool. Review decreased HDL  Past Medical History  Diagnosis Date  . Diabetes mellitus     Type II  . Hypertension   . BPH (benign prostatic hyperplasia)   . Carbuncle     recurrent MRSA carbuncles  . Arthritis   . Cataract   . Glaucoma   . Hyperlipidemia   . Disk prolapse    Past Surgical History  Procedure Date  . Cataract extraction     x 2  . Colonoscopy   . Polypectomy     reports that he has quit smoking. He has never used smokeless tobacco. He reports that he does not drink alcohol or use illicit drugs. family history includes Cancer in his mother and Hypertension in his mother.  There is no history of Colon cancer, and Heart attack, and Hyperlipidemia, and Diabetes, and Sudden death, . No Known Allergies   Review of Systems see history of present illness     Objective:   Physical Exam  Nursing note and vitals reviewed. Constitutional: He appears well-developed and well-nourished. No distress.  HENT:  Head: Normocephalic and atraumatic.  Right Ear: External ear normal.  Left Ear: External ear normal.  Eyes: Conjunctivae normal are normal.  Musculoskeletal:       Full range of motion left arm. Distal left hand strength 5/5. No atrophy  Neurological: He is alert.  Skin: Skin is warm and dry. He is not diaphoretic.  Psychiatric: He has a normal mood and affect.          Assessment & Plan:

## 2012-01-21 NOTE — Assessment & Plan Note (Signed)
Likely represents cervical radiculopathy. No improvement with conservative care. Proceed with cervical MRI

## 2012-01-24 ENCOUNTER — Telehealth: Payer: Self-pay | Admitting: Internal Medicine

## 2012-01-24 NOTE — Telephone Encounter (Signed)
Patient is requesting MRI results.

## 2012-01-24 NOTE — Telephone Encounter (Signed)
Pt called requsting rx refill to be sent to [facility] at [phone] for his metformin, pravastatin and hytrin. Jasmine December called and left vm to return call for clarification.

## 2012-01-27 NOTE — Telephone Encounter (Signed)
Last Rx for metformin, pravastatin, and hytrin 06.21.13 90-day supply w/[1] refill to [facility]; called at [phone] and spoke with representative Courtney. Informed that these Rx[s] needed "'manual processing' that should have been done months ago" by [facility] and she doesn't understand why this wasn't taken care of when patient called prior to myself about this matter; requested overnight shipment, informed that [facility] will be taking care of this "manual refill" and she has placed a notation that pt needs Rx[s] ASAP. [place] pharmacy will contact patient if this [overnight ship] will be a problem per Amelia. MRI is not resulted in Epic; will further research. LMOM with contact name & number to inform patient of this updated information & that I would f/u with him on Monday 10.28.13/SLS

## 2012-01-30 NOTE — Telephone Encounter (Signed)
Follow-up call to patient to verify if he had heard response from [facility] & received requested medication/SLS

## 2012-01-31 ENCOUNTER — Other Ambulatory Visit: Payer: Self-pay | Admitting: Internal Medicine

## 2012-01-31 DIAGNOSIS — M5412 Radiculopathy, cervical region: Secondary | ICD-10-CM

## 2012-01-31 MED ORDER — PRAVASTATIN SODIUM 40 MG PO TABS: ORAL_TABLET | ORAL | Status: DC

## 2012-02-01 ENCOUNTER — Other Ambulatory Visit: Payer: Self-pay | Admitting: Internal Medicine

## 2012-02-01 NOTE — Telephone Encounter (Signed)
MRI results were discussed w/patient and referral was placed to Neurology per verbal Ok by patient & Rx[s] had not yet been received by [facility], short supply sent to local pharmacy/SLS 10.29.13

## 2012-02-01 NOTE — Telephone Encounter (Signed)
Short supply to local; awaiting mail order/SLS

## 2012-03-25 ENCOUNTER — Other Ambulatory Visit: Payer: Self-pay | Admitting: Internal Medicine

## 2012-03-26 NOTE — Telephone Encounter (Signed)
Rx to pharmacy/SLS 

## 2012-04-12 ENCOUNTER — Encounter: Payer: Self-pay | Admitting: Internal Medicine

## 2012-04-12 ENCOUNTER — Ambulatory Visit (INDEPENDENT_AMBULATORY_CARE_PROVIDER_SITE_OTHER): Payer: Medicare PPO | Admitting: Internal Medicine

## 2012-04-12 VITALS — BP 124/76 | HR 54 | Temp 98.4°F | Resp 16 | Wt 208.5 lb

## 2012-04-12 DIAGNOSIS — Z79899 Other long term (current) drug therapy: Secondary | ICD-10-CM

## 2012-04-12 DIAGNOSIS — E119 Type 2 diabetes mellitus without complications: Secondary | ICD-10-CM

## 2012-04-12 DIAGNOSIS — M5412 Radiculopathy, cervical region: Secondary | ICD-10-CM

## 2012-04-12 DIAGNOSIS — M545 Low back pain, unspecified: Secondary | ICD-10-CM

## 2012-04-12 DIAGNOSIS — I10 Essential (primary) hypertension: Secondary | ICD-10-CM

## 2012-04-12 LAB — CBC WITH DIFFERENTIAL/PLATELET
Basophils Absolute: 0 10*3/uL (ref 0.0–0.1)
Basophils Relative: 1 % (ref 0–1)
Eosinophils Absolute: 0 10*3/uL (ref 0.0–0.7)
Eosinophils Relative: 1 % (ref 0–5)
HCT: 37.2 % — ABNORMAL LOW (ref 39.0–52.0)
MCH: 28.4 pg (ref 26.0–34.0)
MCHC: 33.9 g/dL (ref 30.0–36.0)
MCV: 83.8 fL (ref 78.0–100.0)
Monocytes Absolute: 0.3 10*3/uL (ref 0.1–1.0)
Monocytes Relative: 11 % (ref 3–12)
Neutro Abs: 1 10*3/uL — ABNORMAL LOW (ref 1.7–7.7)
RDW: 14 % (ref 11.5–15.5)

## 2012-04-12 LAB — BASIC METABOLIC PANEL
Calcium: 9.3 mg/dL (ref 8.4–10.5)
Glucose, Bld: 102 mg/dL — ABNORMAL HIGH (ref 70–99)
Sodium: 140 mEq/L (ref 135–145)

## 2012-04-12 NOTE — Telephone Encounter (Signed)
Lab orders placed/SLS 

## 2012-04-12 NOTE — Progress Notes (Signed)
  Subjective:    [name], male    DOB: Dec 02, 1940, 72 y.o.   MRN: [id number]  HPI Pt presents to clinic for followup of multiple medical problems. Notes home fsbs range typically low 100's without hypoglycemia. BP reviewed as normotensive. H/o chronic neck pain with radicular LUE pain. Previously referred to surgery but states never received an appt from surgery office. No arm or leg weakness  Past Medical History  Diagnosis Date  . Diabetes mellitus     Type II  . Hypertension   . BPH (benign prostatic hyperplasia)   . Carbuncle     recurrent MRSA carbuncles  . Arthritis   . Cataract   . Glaucoma   . Hyperlipidemia   . Disk prolapse    Past Surgical History  Procedure Date  . Cataract extraction     x 2  . Colonoscopy   . Polypectomy     reports that he has quit smoking. He has never used smokeless tobacco. He reports that he does not drink alcohol or use illicit drugs. family history includes Cancer in his mother and Hypertension in his mother.  There is no history of Colon cancer, and Heart attack, and Hyperlipidemia, and Diabetes, and Sudden death, . No Known Allergies    Review of Systems see hpi     Objective:   Physical Exam  Physical Exam  Nursing note and vitals reviewed. Constitutional: Appears well-developed and well-nourished. No distress.  HENT:  Head: Normocephalic and atraumatic.  Right Ear: External ear normal.  Left Ear: External ear normal.  Eyes: Conjunctivae are normal. No scleral icterus.  Neck: Neck supple. Carotid bruit is not present.  Cardiovascular: Normal rate, regular rhythm and normal heart sounds.  Exam reveals no gallop and no friction rub.   No murmur heard. Pulmonary/Chest: Effort normal and breath sounds normal. No respiratory distress. He has no wheezes. no rales.  Lymphadenopathy:    He has no cervical adenopathy.  Neurological:Alert.  Skin: Skin is warm and dry. Not diaphoretic.  Psychiatric: Has a normal  mood and affect.  MSK: left hand strength (MCP, wrist and intertriginous) 5/5      Assessment & Plan:

## 2012-04-13 LAB — MICROALBUMIN / CREATININE URINE RATIO: Creatinine, Urine: 135.1 mg/dL

## 2012-04-13 LAB — HEMOGLOBIN A1C: Hgb A1c MFr Bld: 6.1 % — ABNORMAL HIGH (ref <5.7)

## 2012-04-15 NOTE — Assessment & Plan Note (Signed)
Obtain cbc, chem7, a1c and urine microalbumin 

## 2012-04-15 NOTE — Assessment & Plan Note (Signed)
Re attempt surgery referral. Instructed to contact clinic if not notified of appt within 4-5 business days

## 2012-04-19 ENCOUNTER — Encounter: Payer: Self-pay | Admitting: Internal Medicine

## 2012-06-05 ENCOUNTER — Telehealth: Payer: Self-pay | Admitting: Internal Medicine

## 2012-06-05 MED ORDER — METFORMIN HCL 500 MG PO TABS: 500.0000 mg | ORAL_TABLET | Freq: Two times a day (BID) | ORAL | Status: DC

## 2012-06-05 NOTE — Telephone Encounter (Signed)
HE NEEDS MERFORMIN  HCL 500 MG TAKE 2 PER DAY QTY 60  HE IS HAVING PROBLEMS WITH [facility] AND NEEDS A 30 DAY CALLED IN TO LOCAL DRUG STORE

## 2012-06-12 ENCOUNTER — Other Ambulatory Visit: Payer: Self-pay | Admitting: Neurosurgery

## 2012-06-12 ENCOUNTER — Ambulatory Visit
Admission: RE | Admit: 2012-06-12 | Discharge: 2012-06-12 | Disposition: A | Discharge status: Home / Self Care | Payer: Medicare PPO | Source: Ambulatory Visit | Attending: Neurosurgery | Admitting: Neurosurgery

## 2012-06-12 DIAGNOSIS — M5030 Other cervical disc degeneration, unspecified cervical region: Secondary | ICD-10-CM

## 2012-06-12 DIAGNOSIS — M47812 Spondylosis without myelopathy or radiculopathy, cervical region: Secondary | ICD-10-CM

## 2012-06-14 ENCOUNTER — Telehealth: Payer: Self-pay | Admitting: Internal Medicine

## 2012-06-14 ENCOUNTER — Ambulatory Visit (INDEPENDENT_AMBULATORY_CARE_PROVIDER_SITE_OTHER): Payer: Medicare PPO | Admitting: Family Medicine

## 2012-06-14 ENCOUNTER — Encounter: Payer: Self-pay | Admitting: Family Medicine

## 2012-06-14 VITALS — BP 150/90 | HR 60 | Temp 97.9°F | Ht 69.0 in | Wt 209.1 lb

## 2012-06-14 DIAGNOSIS — M25512 Pain in left shoulder: Secondary | ICD-10-CM

## 2012-06-14 DIAGNOSIS — E119 Type 2 diabetes mellitus without complications: Secondary | ICD-10-CM

## 2012-06-14 DIAGNOSIS — IMO0001 Reserved for inherently not codable concepts without codable children: Secondary | ICD-10-CM

## 2012-06-14 DIAGNOSIS — D649 Anemia, unspecified: Secondary | ICD-10-CM

## 2012-06-14 DIAGNOSIS — M25519 Pain in unspecified shoulder: Secondary | ICD-10-CM

## 2012-06-14 DIAGNOSIS — E785 Hyperlipidemia, unspecified: Secondary | ICD-10-CM

## 2012-06-14 DIAGNOSIS — I10 Essential (primary) hypertension: Secondary | ICD-10-CM

## 2012-06-14 DIAGNOSIS — Z79899 Other long term (current) drug therapy: Secondary | ICD-10-CM

## 2012-06-14 MED ORDER — PRAVASTATIN SODIUM 40 MG PO TABS: ORAL_TABLET | ORAL | Status: DC

## 2012-06-14 MED ORDER — TERAZOSIN HCL 2 MG PO CAPS: 2.0000 mg | ORAL_CAPSULE | Freq: Every day | ORAL | Status: DC

## 2012-06-14 NOTE — Telephone Encounter (Signed)
Week of 4-14 lipid renal cbc tsh microalb hbga1c

## 2012-06-14 NOTE — Patient Instructions (Addendum)
Labs prior to visit lipid, renal, cbc, tsh, microalb. hgba1c  Diabetes and Exercise Regular exercise is important and can help:   Control blood glucose (sugar).  Decrease blood pressure.    Control blood lipids (cholesterol, triglycerides).  Improve overall health. BENEFITS FROM EXERCISE  Improved fitness.  Improved flexibility.  Improved endurance.  Increased bone density.  Weight control.  Increased muscle strength.  Decreased body fat.  Improvement of the body's use of insulin, a hormone.  Increased insulin sensitivity.  Reduction of insulin needs.  Reduced stress and tension.  Helps you feel better. People with diabetes who add exercise to their lifestyle gain additional benefits, including:  Weight loss.  Reduced appetite.  Improvement of the body's use of blood glucose.  Decreased risk factors for heart disease:  Lowering of cholesterol and triglycerides.  Raising the level of good cholesterol (high-density lipoproteins, HDL).  Lowering blood sugar.  Decreased blood pressure. TYPE 1 DIABETES AND EXERCISE  Exercise will usually lower your blood glucose.  If blood glucose is greater than 240 mg/dl, check urine ketones. If ketones are present, do not exercise.  Location of the insulin injection sites may need to be adjusted with exercise. Avoid injecting insulin into areas of the body that will be exercised. For example, avoid injecting insulin into:  The arms when playing tennis.  The legs when jogging. For more information, discuss this with your caregiver.  Keep a record of:  Food intake.  Type and amount of exercise.  Expected peak times of insulin action.  Blood glucose levels. Do this before, during, and after exercise. Review your records with your caregiver. This will help you to develop guidelines for adjusting food intake and insulin amounts.  TYPE 2 DIABETES AND EXERCISE  Regular physical activity can help control blood  glucose.  Exercise is important because it may:  Increase the body's sensitivity to insulin.  Improve blood glucose control.  Exercise reduces the risk of heart disease. It decreases serum cholesterol and triglycerides. It also lowers blood pressure.  Those who take insulin or oral hypoglycemic agents should watch for signs of hypoglycemia. These signs include dizziness, shaking, sweating, chills, and confusion.  Body water is lost during exercise. It must be replaced. This will help to avoid loss of body fluids (dehydration) or heat stroke. Be sure to talk to your caregiver before starting an exercise program to make sure it is safe for you. Remember, any activity is better than none.  Document Released: 06/11/2003 Document Revised: 06/13/2011 Document Reviewed: 09/25/2008 [facility] Patient Information 2013 [place], [place].

## 2012-06-15 NOTE — Progress Notes (Signed)
Quick Note:  Patient Informed and voiced understanding ______ 

## 2012-06-17 ENCOUNTER — Encounter: Payer: Self-pay | Admitting: Family Medicine

## 2012-06-17 NOTE — Assessment & Plan Note (Signed)
Continue Metformin, avoid simple carbs.  

## 2012-06-17 NOTE — Progress Notes (Signed)
[name], male   DOB: 02/16/1941, 72 y.o.   MRN: [id number] EDI GORNIAK [id number] 1940/12/16 06/17/2012      Progress Note-Follow Up  Subjective  Chief Complaint  Chief Complaint  Patient presents with  . Follow-up    3 month    HPI  72 year old male who is in today for followup. He is with left shoulder pain and after MRIs have been completed decided this is actually coming from pathology in his neck. He is scheduled with neurology to follow this up. He questions whether metformin could be contributing to this. No other acute complaints although his wife points out that has been falling more recently. He acknowledges this when she brings it up. He does not have any warning and is going to fall or any syncope. 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 No other recent illness. No chest pain or palpitations. No shortness or breath GI or GU concerns noted today  Past Medical History  Diagnosis Date  . Diabetes mellitus     Type II  . Hypertension   . BPH (benign prostatic hyperplasia)   . Carbuncle     recurrent MRSA carbuncles  . Arthritis   . Cataract   . Glaucoma   . Hyperlipidemia   . Disk prolapse     Past Surgical History  Procedure Laterality Date  . Cataract extraction      x 2  . Colonoscopy    . Polypectomy      Family History  Problem Relation Age of Onset  . Cancer Mother   . Hypertension Mother   . Colon cancer Neg Hx   . Heart attack Neg Hx   . Hyperlipidemia Neg Hx   . Diabetes Neg Hx   . Sudden death Neg Hx     History   Social History  . Marital Status: Married    Spouse Name: N/A    Number of Children: N/A  . Years of Education: N/A   Occupational History  . Not on file.   Social History Main Topics  . Smoking status: Former Games developer  . Smokeless tobacco: Never Used  . Alcohol Use: No  . Drug Use: No  . Sexually Active: Not on file   Other Topics Concern  . Not on file   Social History Narrative   [name]   Married    Former Smoker      Alcohol use-no     Current Outpatient Prescriptions on File Prior to Visit  Medication Sig Dispense Refill  . aspirin 81 MG tablet Take 81 mg by mouth daily.       Marland Kitchen glucose blood (ONE TOUCH TEST STRIPS) test strip 1 each by Other route as needed. Use as instructed to check blood sugar once daily (dx 250.00)      . metFORMIN (GLUCOPHAGE) 500 MG tablet Take 1 tablet (500 mg total) by mouth 2 (two) times daily with a meal.  60 tablet  0  . ONE TOUCH LANCETS MISC by Does not apply route. Use to check blood sugar once daily (dx code 250.00)       Current Facility-Administered Medications on File Prior to Visit  Medication Dose Route Frequency Provider Last Rate Last Dose  . 0.9 %  sodium chloride infusion  500 mL Intravenous Continuous Mardella Layman, MD        No Known Allergies  Review of Systems  Review of Systems  Constitutional: Negative for fever and malaise/fatigue.  HENT: Negative for congestion.   Eyes: Negative for discharge.  Respiratory: Negative for shortness of breath.   Cardiovascular: Negative for chest pain, palpitations and leg swelling.  Gastrointestinal: Negative for nausea, abdominal pain and diarrhea.  Genitourinary: Negative for dysuria.  Musculoskeletal: Positive for joint pain. Negative for falls.  Skin: Negative for rash.  Neurological: Negative for loss of consciousness and headaches.  Endo/Heme/Allergies: Negative for polydipsia.  Psychiatric/Behavioral: Negative for depression and suicidal ideas. The patient is not nervous/anxious and does not have insomnia.     Objective  BP 150/90  Pulse 60  Temp(Src) 97.9 F (36.6 C) (Oral)  Ht 5\' 9"  (1.753 m)  Wt 209 lb 1.9 oz (94.856 kg)  BMI 30.87 kg/m2  SpO2 97%  Physical Exam  Physical Exam  Constitutional: He is oriented to person, place, and time and well-developed, well-nourished, and in no distress. No distress.  HENT:  Head: Normocephalic and atraumatic.  Eyes:  Conjunctivae are normal.  Neck: Neck supple. No thyromegaly present.  Cardiovascular: Normal rate, regular rhythm and normal heart sounds.   Pulmonary/Chest: Effort normal and breath sounds normal. No respiratory distress.  Abdominal: He exhibits no distension and no mass. There is no tenderness.  Musculoskeletal: He exhibits no edema.  Neurological: He is alert and oriented to person, place, and time.  Skin: Skin is warm.  Psychiatric: Memory, affect and judgment normal.    Lab Results  Component Value Date   TSH 2.596 04/12/2012   Lab Results  Component Value Date   WBC 2.7* 04/12/2012   HGB 12.6* 04/12/2012   HCT 37.2* 04/12/2012   MCV 83.8 04/12/2012   PLT 194 04/12/2012   Lab Results  Component Value Date   CREATININE 1.13 04/12/2012   BUN 14 04/12/2012   NA 140 04/12/2012   K 4.5 04/12/2012   CL 105 04/12/2012   CO2 29 04/12/2012   Lab Results  Component Value Date   ALT 14 01/10/2012   AST 19 01/10/2012   ALKPHOS 40 01/10/2012   BILITOT 0.4 01/10/2012   Lab Results  Component Value Date   CHOL 153 01/10/2012   Lab Results  Component Value Date   HDL 34* 01/10/2012   Lab Results  Component Value Date   LDLCALC 96 01/10/2012   Lab Results  Component Value Date   TRIG 115 01/10/2012   Lab Results  Component Value Date   CHOLHDL 4.5 01/10/2012     Assessment & Plan  HYPERTENSION Mild elevation secondary to pain, patient hesitant to start meds. Consider meds at next visit as needed.  DIABETES MELLITUS, TYPE II Continue Metformin, avoid simple carbs.   HYPERLIPIDEMIA Tolerating Pravastatin, avoid trans fats, add megaRed krill oil  Shoulder pain, left Has had an MRi and they have concluded this is caused by disease in his neck is scheduled to see neurology later this month

## 2012-06-17 NOTE — Assessment & Plan Note (Signed)
Mild elevation secondary to pain, patient hesitant to start meds. Consider meds at next visit as needed.

## 2012-06-17 NOTE — Assessment & Plan Note (Signed)
Tolerating Pravastatin, avoid trans fats, add megaRed krill oil

## 2012-06-17 NOTE — Assessment & Plan Note (Signed)
Has had an MRi and they have concluded this is caused by disease in his neck is scheduled to see neurology later this month

## 2012-07-03 ENCOUNTER — Other Ambulatory Visit: Payer: Self-pay | Admitting: Neurosurgery

## 2012-07-03 DIAGNOSIS — M5410 Radiculopathy, site unspecified: Secondary | ICD-10-CM

## 2012-07-03 DIAGNOSIS — M4800 Spinal stenosis, site unspecified: Secondary | ICD-10-CM

## 2012-07-09 ENCOUNTER — Ambulatory Visit
Admission: RE | Admit: 2012-07-09 | Discharge: 2012-07-09 | Disposition: A | Discharge status: Home / Self Care | Payer: Medicare PPO | Source: Ambulatory Visit | Attending: Neurosurgery | Admitting: Neurosurgery

## 2012-07-09 VITALS — BP 137/80 | HR 54

## 2012-07-09 DIAGNOSIS — M5410 Radiculopathy, site unspecified: Secondary | ICD-10-CM

## 2012-07-09 DIAGNOSIS — M4800 Spinal stenosis, site unspecified: Secondary | ICD-10-CM

## 2012-07-09 DIAGNOSIS — M79602 Pain in left arm: Secondary | ICD-10-CM

## 2012-07-09 DIAGNOSIS — M25512 Pain in left shoulder: Secondary | ICD-10-CM

## 2012-07-09 MED ORDER — IOHEXOL 300 MG/ML  SOLN
1.0000 mL | Freq: Once | INTRAMUSCULAR | Status: AC | PRN
  Administered 2012-07-09: 1 mL via EPIDURAL

## 2012-07-09 MED ORDER — TRIAMCINOLONE ACETONIDE 40 MG/ML IJ SUSP (RADIOLOGY)
60.0000 mg | Freq: Once | INTRAMUSCULAR | Status: AC
  Administered 2012-07-09: 60 mg via EPIDURAL

## 2012-07-12 ENCOUNTER — Ambulatory Visit: Payer: Medicare PPO | Admitting: Family Medicine

## 2012-07-16 ENCOUNTER — Telehealth: Payer: Self-pay | Admitting: Family Medicine

## 2012-07-16 LAB — LIPID PANEL
Cholesterol: 143 mg/dL (ref 0–200)
HDL: 41 mg/dL (ref >39)
Total CHOL/HDL Ratio: 3.5 Ratio
VLDL: 13 mg/dL (ref 0–40)

## 2012-07-16 LAB — RENAL FUNCTION PANEL
Albumin: 3.9 g/dL (ref 3.5–5.2)
BUN: 20 mg/dL (ref 6–23)
Calcium: 9.3 mg/dL (ref 8.4–10.5)
Creat: 1.06 mg/dL (ref 0.50–1.35)
Phosphorus: 3 mg/dL (ref 2.3–4.6)
Potassium: 4.8 mEq/L (ref 3.5–5.3)

## 2012-07-16 LAB — CBC
Hemoglobin: 12.2 g/dL — ABNORMAL LOW (ref 13.0–17.0)
MCHC: 33.3 g/dL (ref 30.0–36.0)
Platelets: 206 10*3/uL (ref 150–400)
RDW: 14.5 % (ref 11.5–15.5)

## 2012-07-16 LAB — TSH: TSH: 1.717 u[IU]/mL (ref 0.350–4.500)

## 2012-07-16 NOTE — Telephone Encounter (Signed)
OK, I will see him

## 2012-07-16 NOTE — Telephone Encounter (Signed)
His daughter,Vaniette Garhett Bernhard is a patient of yours.  She says she talked to you at her appt.  She says his brother is a patient of yours, she didn't give his name.

## 2012-07-16 NOTE — Telephone Encounter (Signed)
Pt's daughter called because her father, Jonathan Lewis wants to change his PCP from Dr Abner Greenspan to Dr Debby Bud because he lives in [place] now. He has [employer].

## 2012-07-16 NOTE — Telephone Encounter (Signed)
OK with me.

## 2012-07-16 NOTE — Telephone Encounter (Signed)
Generally not taking new patients. Did I say I would? Is the daughter my patient? If she is I guess it is ok, if she is not my patient then whoever in the office is taking new patients is the winner.

## 2012-07-17 LAB — MICROALBUMIN, URINE: Microalb, Ur: 0.5 mg/dL (ref 0.00–1.89)

## 2012-07-17 NOTE — Telephone Encounter (Signed)
Pt's daughter is aware.  Appt made for July 1.

## 2012-07-26 ENCOUNTER — Encounter: Payer: Self-pay | Admitting: Family Medicine

## 2012-07-26 ENCOUNTER — Ambulatory Visit (INDEPENDENT_AMBULATORY_CARE_PROVIDER_SITE_OTHER): Payer: Medicare PPO | Admitting: Family Medicine

## 2012-07-26 VITALS — BP 122/78 | HR 83 | Temp 98.1°F | Ht 69.0 in | Wt 202.0 lb

## 2012-07-26 DIAGNOSIS — K649 Unspecified hemorrhoids: Secondary | ICD-10-CM

## 2012-07-26 DIAGNOSIS — K644 Residual hemorrhoidal skin tags: Secondary | ICD-10-CM

## 2012-07-26 DIAGNOSIS — K5900 Constipation, unspecified: Secondary | ICD-10-CM

## 2012-07-26 DIAGNOSIS — E119 Type 2 diabetes mellitus without complications: Secondary | ICD-10-CM

## 2012-07-26 DIAGNOSIS — J329 Chronic sinusitis, unspecified: Secondary | ICD-10-CM

## 2012-07-26 DIAGNOSIS — I10 Essential (primary) hypertension: Secondary | ICD-10-CM

## 2012-07-26 DIAGNOSIS — E875 Hyperkalemia: Secondary | ICD-10-CM

## 2012-07-26 DIAGNOSIS — J019 Acute sinusitis, unspecified: Secondary | ICD-10-CM

## 2012-07-26 MED ORDER — SENNA-DOCUSATE SODIUM 8.6-50 MG PO TABS: 1.0000 | ORAL_TABLET | Freq: Every day | ORAL | Status: DC

## 2012-07-26 MED ORDER — AMOXICILLIN-POT CLAVULANATE 875-125 MG PO TABS: 1.0000 | ORAL_TABLET | Freq: Two times a day (BID) | ORAL | Status: DC

## 2012-07-26 MED ORDER — HYDROCOD POLST-CHLORPHEN POLST 10-8 MG/5ML PO LQCR: 5.0000 mL | Freq: Every evening | ORAL | Status: DC | PRN

## 2012-07-26 MED ORDER — HYDROCORTISONE ACETATE 25 MG RE SUPP: 25.0000 mg | Freq: Two times a day (BID) | RECTAL | Status: DC

## 2012-07-26 MED ORDER — GUAIFENESIN ER 600 MG PO TB12: 1200.0000 mg | ORAL_TABLET | Freq: Two times a day (BID) | ORAL | Status: DC

## 2012-07-26 NOTE — Patient Instructions (Addendum)
Use the Anusol HC suppositories at bedtime for 7 days and then as needed after that.  Call to see Gastroenterology if no improvement in bowel movements Start A fiber supplement such as Metamucil, increase fluids to 64 oz a day and start a probiotic such as Align or Digestive Advantage daily for at least the next month   Hemorrhoids Hemorrhoids are enlarged (dilated) veins around the rectum. There are 2 types of hemorrhoids, and the type of hemorrhoid is determined by its location. Internal hemorrhoids occur in the veins just inside the rectum.They are usually not painful, but they may bleed.However, they may poke through to the outside and become irritated and painful. External hemorrhoids involve the veins outside the anus and can be felt as a painful swelling or hard lump near the anus.They are often itchy and may crack and bleed. Sometimes clots will form in the veins. This makes them swollen and painful. These are called thrombosed hemorrhoids. CAUSES Causes of hemorrhoids include:  Pregnancy. This increases the pressure in the hemorrhoidal veins.  Constipation.  Straining to have a bowel movement.  Obesity.  Heavy lifting or other activity that caused you to strain. TREATMENT Most of the time hemorrhoids improve in 1 to 2 weeks. However, if symptoms do not seem to be getting better or if you have a lot of rectal bleeding, your caregiver may perform a procedure to help make the hemorrhoids get smaller or remove them completely.Possible treatments include:  Rubber band ligation. A rubber band is placed at the base of the hemorrhoid to cut off the circulation.  Sclerotherapy. A chemical is injected to shrink the hemorrhoid.  Infrared light therapy. Tools are used to burn the hemorrhoid.  Hemorrhoidectomy. This is surgical removal of the hemorrhoid. HOME CARE INSTRUCTIONS   Increase fiber in your diet. Ask your caregiver about using fiber supplements.  Drink enough water and  fluids to keep your urine clear or pale yellow.  Exercise regularly.  Go to the bathroom when you have the urge to have a bowel movement. Do not wait.  Avoid straining to have bowel movements.  Keep the anal area dry and clean.  Only take over-the-counter or prescription medicines for pain, discomfort, or fever as directed by your caregiver. If your hemorrhoids are thrombosed:  Take warm sitz baths for 20 to 30 minutes, 3 to 4 times per day.  If the hemorrhoids are very tender and swollen, place ice packs on the area as tolerated. Using ice packs between sitz baths may be helpful. Fill a plastic bag with ice. Place a towel between the bag of ice and your skin.  Medicated creams and suppositories may be used or applied as directed.  Do not use a donut-shaped pillow or sit on the toilet for long periods. This increases blood pooling and pain. SEEK MEDICAL CARE IF:   You have increasing pain and swelling that is not controlled with your medicine.  You have uncontrolled bleeding.  You have difficulty or you are unable to have a bowel movement.  You have pain or inflammation outside the area of the hemorrhoids.  You have chills or an oral temperature above 102 F (38.9 C). MAKE SURE YOU:   Understand these instructions.  Will watch your condition.  Will get help right away if you are not doing well or get worse. Document Released: 03/18/2000 Document Revised: 06/13/2011 Document Reviewed: 03/01/2010 [facility] Patient Information 2013 [place], [place].

## 2012-07-29 ENCOUNTER — Encounter: Payer: Self-pay | Admitting: Family Medicine

## 2012-07-29 DIAGNOSIS — J329 Chronic sinusitis, unspecified: Secondary | ICD-10-CM

## 2012-07-29 HISTORY — DX: Chronic sinusitis, unspecified: J32.9

## 2012-07-29 NOTE — Progress Notes (Signed)
[name], male   DOB: 1940/12/18, 72 y.o.   MRN: [id number] [name] [id number] [birth year] 07/29/2012      Progress Note-Follow Up  Subjective  Chief Complaint  Chief Complaint  Patient presents with  . Follow-up    6 week  . URI    head cold, cough w/phlegm (yellow) X 4 days    HPI  Patient is a 72 year old demented male who is here today in followup. He is complaining of a for a 5 day history of worsening respiratory symptoms. Is complaining of chills, malaise, myalgias. He has significant head congestion and postnasal drip. He has rhinorrhea and cough productive of yellow phlegm. Cough is worse at night keeping him up. He low grade headache last night as well. No chest pain, palpitations or shortness of breath. He is complaining of some rectal irritation and swelling. Mild pruritus at times. No bloody or tarry stool. Notes that he is moving his bowels less frequently than he used to. Every 4-7 days is his average at this time. No significant pain or straining associated. To have a colonoscopy back in 2012 No anorexia, nausea or vomiting. No diarrhea.  Past Medical History  Diagnosis Date  . Diabetes mellitus     Type II  . Hypertension   . BPH (benign prostatic hyperplasia)   . Carbuncle     recurrent MRSA carbuncles  . Arthritis   . Cataract   . Glaucoma   . Hyperlipidemia   . Disk prolapse   . Sinusitis 07/29/2012    Past Surgical History  Procedure Laterality Date  . Cataract extraction      x 2  . Colonoscopy    . Polypectomy      Family History  Problem Relation Age of Onset  . Cancer Mother   . Hypertension Mother   . Colon cancer Neg Hx   . Heart attack Neg Hx   . Hyperlipidemia Neg Hx   . Diabetes Neg Hx   . Sudden death Neg Hx     History   Social History  . Marital Status: Married    Spouse Name: N/A    Number of Children: N/A  . Years of Education: N/A   Occupational History  . Not on file.   Social History Main  Topics  . Smoking status: Former Games developer  . Smokeless tobacco: Never Used  . Alcohol Use: No  . Drug Use: No  . Sexually Active: Not on file   Other Topics Concern  . Not on file   Social History Narrative   Jonathan Lewis   Married    Former Smoker      Alcohol use-no     Current Outpatient Prescriptions on File Prior to Visit  Medication Sig Dispense Refill  . aspirin 81 MG tablet Take 81 mg by mouth daily.       Marland Kitchen glucose blood (ONE TOUCH TEST STRIPS) test strip 1 each by Other route as needed. Use as instructed to check blood sugar once daily (dx 250.00)      . metFORMIN (GLUCOPHAGE) 500 MG tablet Take 1 tablet (500 mg total) by mouth 2 (two) times daily with a meal.  60 tablet  0  . ONE TOUCH LANCETS MISC by Does not apply route. Use to check blood sugar once daily (dx code 250.00)      . pravastatin (PRAVACHOL) 40 MG tablet TAKE 1 TABLET DAILY  30 tablet  1  . terazosin (HYTRIN) 2 MG  capsule Take 1 capsule (2 mg total) by mouth at bedtime.  30 capsule  1   Current Facility-Administered Medications on File Prior to Visit  Medication Dose Route Frequency Provider Last Rate Last Dose  . 0.9 %  sodium chloride infusion  500 mL Intravenous Continuous Mardella Layman, MD        No Known Allergies  Review of Systems  Review of Systems  Constitutional: Positive for chills. Negative for fever and malaise/fatigue.  HENT: Positive for congestion and sore throat. Negative for hearing loss and nosebleeds.   Eyes: Negative for discharge.  Respiratory: Positive for cough and sputum production. Negative for shortness of breath and wheezing.   Cardiovascular: Negative for chest pain, palpitations and leg swelling.  Gastrointestinal: Positive for constipation. Negative for heartburn, nausea, vomiting, abdominal pain, diarrhea, blood in stool and melena.  Genitourinary: Negative for dysuria, urgency, frequency and hematuria.  Musculoskeletal: Negative for myalgias, back pain and falls.  Skin:  Negative for rash.  Neurological: Positive for headaches. Negative for dizziness, tremors, sensory change, focal weakness, loss of consciousness and weakness.  Endo/Heme/Allergies: Negative for polydipsia. Does not bruise/bleed easily.  Psychiatric/Behavioral: Negative for depression and suicidal ideas. The patient has insomnia. The patient is not nervous/anxious.     Objective  BP 122/78  Pulse 83  Temp(Src) 98.1 F (36.7 C) (Oral)  Ht 5\' 9"  (1.753 m)  Wt 202 lb 0.6 oz (91.645 kg)  BMI 29.82 kg/m2  SpO2 95%  Physical Exam  Physical Exam  Constitutional: He is oriented to person, place, and time and well-developed, well-nourished, and in no distress. No distress.  HENT:  Head: Normocephalic and atraumatic.  Nasal mucosa boggy and erythematous  Eyes: Conjunctivae are normal.  Neck: Neck supple. No thyromegaly present.  Cardiovascular: Normal rate, regular rhythm and normal heart sounds.   No murmur heard. Pulmonary/Chest: Effort normal and breath sounds normal. No respiratory distress.  Abdominal: He exhibits no distension and no mass. There is no tenderness.  Genitourinary:  External hemorrhoid, mildly erythematous. Heme negative rectal exam  Musculoskeletal: He exhibits no edema.  Neurological: He is alert and oriented to person, place, and time.  Skin: Skin is warm.  Psychiatric: Memory, affect and judgment normal.    Lab Results  Component Value Date   TSH 1.717 07/16/2012   Lab Results  Component Value Date   WBC 2.8* 07/16/2012   HGB 12.2* 07/16/2012   HCT 36.6* 07/16/2012   MCV 85.5 07/16/2012   PLT 206 07/16/2012   Lab Results  Component Value Date   CREATININE 1.06 07/16/2012   BUN 20 07/16/2012   NA 140 07/16/2012   K 4.8 07/16/2012   CL 106 07/16/2012   CO2 28 07/16/2012   Lab Results  Component Value Date   ALT 14 01/10/2012   AST 19 01/10/2012   ALKPHOS 40 01/10/2012   BILITOT 0.4 01/10/2012   Lab Results  Component Value Date   CHOL 143 07/16/2012    Lab Results  Component Value Date   HDL 41 07/16/2012   Lab Results  Component Value Date   LDLCALC 89 07/16/2012   Lab Results  Component Value Date   TRIG 66 07/16/2012   Lab Results  Component Value Date   CHOLHDL 3.5 07/16/2012     Assessment & Plan  HYPERTENSION Adequately controlled today, no changes to meds  EXTERNAL HEMORRHOIDS Anusol HC suppository qhs x 5-7 days and then as needed after that, report if pain or irratation worsens or does  not resolve  Constipation Encouraged increased hydration, probiotics, fiber supplement twice daily and if no improvement then add Senna S 1-2 caps daily  Sinusitis Augmentin,, Mucinex, probiotics, increased rest and fluids, Tussionex qhs prn.  DIABETES MELLITUS, TYPE II hgba1c 6.4. No changes necessary today, continue to minimize simple carb intake, will be transferring his care into [facility] in next month or so, will follow up with new PMD

## 2012-07-29 NOTE — Assessment & Plan Note (Signed)
Encouraged increased hydration, probiotics, fiber supplement twice daily and if no improvement then add Senna S 1-2 caps daily

## 2012-07-29 NOTE — Assessment & Plan Note (Signed)
hgba1c 6.4. No changes necessary today, continue to minimize simple carb intake, will be transferring his care into [facility] in next month or so, will follow up with new PMD

## 2012-07-29 NOTE — Assessment & Plan Note (Signed)
Adequately controlled today, no changes to meds

## 2012-07-29 NOTE — Assessment & Plan Note (Signed)
Anusol HC suppository qhs x 5-7 days and then as needed after that, report if pain or irratation worsens or does not resolve

## 2012-07-29 NOTE — Assessment & Plan Note (Signed)
Augmentin,, Mucinex, probiotics, increased rest and fluids, Tussionex qhs prn.

## 2012-08-07 ENCOUNTER — Telehealth: Payer: Self-pay | Admitting: Family Medicine

## 2012-08-07 NOTE — Telephone Encounter (Signed)
Patient diabetic testing supplies order form placed in physician's box.

## 2012-09-03 ENCOUNTER — Telehealth: Payer: Self-pay | Admitting: Internal Medicine

## 2012-09-03 MED ORDER — TERAZOSIN HCL 2 MG PO CAPS: 2.0000 mg | ORAL_CAPSULE | Freq: Every day | ORAL | Status: DC

## 2012-09-03 NOTE — Telephone Encounter (Signed)
Please advise refill? Last RX was done on 06-14-12 quantity 30 with 1 refill.  If ok fax to [phone]

## 2012-09-03 NOTE — Telephone Encounter (Signed)
OK to refill Terazosin #30, 3rf

## 2012-09-03 NOTE — Telephone Encounter (Signed)
Terazosin 2mg  take 1 per day at bedtime qty 30

## 2012-10-02 ENCOUNTER — Ambulatory Visit (INDEPENDENT_AMBULATORY_CARE_PROVIDER_SITE_OTHER): Payer: Medicare PPO | Admitting: Internal Medicine

## 2012-10-02 ENCOUNTER — Encounter: Payer: Self-pay | Admitting: Internal Medicine

## 2012-10-02 VITALS — BP 140/90 | HR 61 | Temp 98.0°F | Resp 12 | Ht 68.0 in | Wt 204.6 lb

## 2012-10-02 DIAGNOSIS — I10 Essential (primary) hypertension: Secondary | ICD-10-CM

## 2012-10-02 DIAGNOSIS — M545 Low back pain, unspecified: Secondary | ICD-10-CM

## 2012-10-02 DIAGNOSIS — K5900 Constipation, unspecified: Secondary | ICD-10-CM

## 2012-10-02 DIAGNOSIS — E785 Hyperlipidemia, unspecified: Secondary | ICD-10-CM

## 2012-10-02 DIAGNOSIS — E119 Type 2 diabetes mellitus without complications: Secondary | ICD-10-CM

## 2012-10-02 NOTE — Assessment & Plan Note (Signed)
He has been taking a stool softener. Hs not had relief with bulk laxative. He has not tried miralax or daily senekot.

## 2012-10-02 NOTE — Patient Instructions (Addendum)
Welcome to my practice (along with some other mcNeils)  Your chart has been reviewed: you are current with lab work - done in April including a well controlled A1C - the test for diabetes.  On exam you have decreased sensation to vibration - an early sign of nerve damage to the feet from the diabetes. You need to INSPECT your feet every day.  Please give consideration to advance care planning (see packet) and you can have your son pull up information on the computer at [email or web address] which is about adanced care planning and families talking about these issues.  You will need to come back October for lab work with an appointment to follow for a more thorough exam.  Enjoy the 4th - not too many hot dogs!!

## 2012-10-02 NOTE — Assessment & Plan Note (Signed)
Last lab with LDL better than goal of 100 or less.  Good control. Next lab in 3 months

## 2012-10-02 NOTE — Assessment & Plan Note (Signed)
Lab Results  Component Value Date   HGBA1C 6.4* 07/16/2012   Good control in April  Plan Return in November for exam and repeat lab.

## 2012-10-02 NOTE — Progress Notes (Signed)
Subjective:    [name], male    DOB: June 28, 1940, 72 y.o.   MRN: [id number]  HPI Jonathan Lewis presents to establish for on-going care in transfer from Dr. Rogelia Lewis. He has minor knee pain as his only complaint.  Past Medical History  Diagnosis Date  . Diabetes mellitus     Type II  . Hypertension   . BPH (benign prostatic hyperplasia)   . Carbuncle     recurrent MRSA carbuncles  . Arthritis   . Cataract   . Glaucoma   . Hyperlipidemia   . Disk prolapse   . Sinusitis 07/29/2012   Past Surgical History  Procedure Laterality Date  . Cataract extraction      x 2  . Colonoscopy    . Polypectomy     Family History  Problem Relation Age of Onset  . Cancer Mother   . Hypertension Mother   . Colon cancer Neg Hx   . Heart attack Neg Hx   . Sudden death Neg Hx   . Diabetes Sister   . Cancer Brother     colon  . Hypertension Brother   . Hyperlipidemia Brother   . Stroke Brother   . Hypertension Sister    History   Social History  . Marital Status: Married    Spouse Name: N/A    Number of Children: 7  . Years of Education: 7   Occupational History  . pastor    Social History Main Topics  . Smoking status: Former Smoker    Quit date: 10/03/1978  . Smokeless tobacco: Never Used  . Alcohol Use: 2.5 oz/week    5 drink(s) per week     Comment: pint, occasion beer  . Drug Use: No  . Sexually Active: Yes -- Male partner(s)   Other Topics Concern  . Not on file   Social History Narrative   9th grade.  Married '60. Jonathan Lewis 3 sons -'27 '64, '66; 4 dtrs -'60, '62, '61, '65; 15 grands, 25 great-grands.   Jonathan Lewis, some cleaning work. Lives alone with wife.   ACD- discussed living will and HCPOA (July '14) provided packet.           Current Outpatient Prescriptions on File Prior to Visit  Medication Sig Dispense Refill  . aspirin 81 MG tablet Take 81 mg by mouth daily.       . chlorpheniramine-HYDROcodone (TUSSIONEX) 10-8 MG/5ML LQCR Take 5 mLs by  mouth at bedtime as needed.  140 mL  0  . glucose blood (ONE TOUCH TEST STRIPS) test strip 1 each by Other route as needed. Use as instructed to check blood sugar once daily (dx 250.00)      . hydrocortisone (ANUSOL-HC) 25 MG suppository Place 1 suppository (25 mg total) rectally 2 (two) times daily.  12 suppository  0  . metFORMIN (GLUCOPHAGE) 500 MG tablet Take 1 tablet (500 mg total) by mouth 2 (two) times daily with a meal.  60 tablet  0  . ONE TOUCH LANCETS MISC by Does not apply route. Use to check blood sugar once daily (dx code 250.00)      . pravastatin (PRAVACHOL) 40 MG tablet TAKE 1 TABLET DAILY  30 tablet  1  . terazosin (HYTRIN) 2 MG capsule Take 1 capsule (2 mg total) by mouth at bedtime.  30 capsule  3   Current Facility-Administered Medications on File Prior to Visit  Medication Dose Route Frequency Provider Last Rate Last Dose  . 0.9 %  sodium chloride infusion  500 mL Intravenous Continuous Jonathan Layman, MD          Review of Systems System review is negative for any constitutional, cardiac, pulmonary, GI or neuro symptoms or complaints other than as described in the HPI.      Objective:   Physical Exam Filed Vitals:   10/02/12 1003  BP: 140/90  Pulse: 61  Temp: 98 F (36.7 C)  Resp: 12   Wt Readings from Last 3 Encounters:  10/02/12 204 lb 9.6 oz (92.806 kg)  07/26/12 202 lb 0.6 oz (91.645 kg)  06/14/12 209 lb 1.9 oz (94.856 kg)    Gen'l - WNWD AA man in no distress HEENT- C&S clear, eye exam deferred to Dr. Veda Lewis - March '14 Cor- RRR Pulm - clear Neuro - A&O x 3, see DM foot exam       Assessment & Plan:

## 2012-10-02 NOTE — Assessment & Plan Note (Addendum)
BP Readings from Last 3 Encounters:  10/02/12 140/90  07/26/12 122/78  07/09/12 137/80   Adequate control although just a bit high at today's exam.  Follow up visit in 3 months

## 2012-12-06 ENCOUNTER — Other Ambulatory Visit: Payer: Self-pay | Admitting: Internal Medicine

## 2012-12-12 ENCOUNTER — Emergency Department (HOSPITAL_COMMUNITY): Payer: Medicare PPO

## 2012-12-12 ENCOUNTER — Encounter (HOSPITAL_COMMUNITY): Payer: Self-pay | Admitting: Family Medicine

## 2012-12-12 ENCOUNTER — Emergency Department (HOSPITAL_COMMUNITY)
Admission: EM | Admit: 2012-12-12 | Discharge: 2012-12-12 | Disposition: A | Discharge status: Home / Self Care | Payer: Medicare PPO | Attending: Emergency Medicine | Admitting: Emergency Medicine

## 2012-12-12 DIAGNOSIS — R531 Weakness: Secondary | ICD-10-CM

## 2012-12-12 DIAGNOSIS — IMO0002 Reserved for concepts with insufficient information to code with codable children: Secondary | ICD-10-CM | POA: Insufficient documentation

## 2012-12-12 DIAGNOSIS — H269 Unspecified cataract: Secondary | ICD-10-CM | POA: Insufficient documentation

## 2012-12-12 DIAGNOSIS — R42 Dizziness and giddiness: Secondary | ICD-10-CM | POA: Insufficient documentation

## 2012-12-12 DIAGNOSIS — E119 Type 2 diabetes mellitus without complications: Secondary | ICD-10-CM | POA: Insufficient documentation

## 2012-12-12 DIAGNOSIS — R5381 Other malaise: Secondary | ICD-10-CM | POA: Insufficient documentation

## 2012-12-12 DIAGNOSIS — I10 Essential (primary) hypertension: Secondary | ICD-10-CM | POA: Insufficient documentation

## 2012-12-12 DIAGNOSIS — M129 Arthropathy, unspecified: Secondary | ICD-10-CM | POA: Insufficient documentation

## 2012-12-12 DIAGNOSIS — Z872 Personal history of diseases of the skin and subcutaneous tissue: Secondary | ICD-10-CM | POA: Insufficient documentation

## 2012-12-12 DIAGNOSIS — N400 Enlarged prostate without lower urinary tract symptoms: Secondary | ICD-10-CM | POA: Insufficient documentation

## 2012-12-12 DIAGNOSIS — Z87891 Personal history of nicotine dependence: Secondary | ICD-10-CM | POA: Insufficient documentation

## 2012-12-12 DIAGNOSIS — E785 Hyperlipidemia, unspecified: Secondary | ICD-10-CM | POA: Insufficient documentation

## 2012-12-12 DIAGNOSIS — R61 Generalized hyperhidrosis: Secondary | ICD-10-CM | POA: Insufficient documentation

## 2012-12-12 DIAGNOSIS — Z7982 Long term (current) use of aspirin: Secondary | ICD-10-CM | POA: Insufficient documentation

## 2012-12-12 DIAGNOSIS — Z79899 Other long term (current) drug therapy: Secondary | ICD-10-CM | POA: Insufficient documentation

## 2012-12-12 DIAGNOSIS — R110 Nausea: Secondary | ICD-10-CM

## 2012-12-12 DIAGNOSIS — R059 Cough, unspecified: Secondary | ICD-10-CM | POA: Insufficient documentation

## 2012-12-12 DIAGNOSIS — R05 Cough: Secondary | ICD-10-CM | POA: Insufficient documentation

## 2012-12-12 LAB — CBC
Hemoglobin: 13.2 g/dL (ref 13.0–17.0)
MCHC: 34 g/dL (ref 30.0–36.0)
RDW: 12.8 % (ref 11.5–15.5)

## 2012-12-12 LAB — HEPATIC FUNCTION PANEL: Total Protein: 7 g/dL (ref 6.0–8.3)

## 2012-12-12 LAB — POCT I-STAT TROPONIN I: Troponin i, poc: 0 ng/mL (ref 0.00–0.08)

## 2012-12-12 LAB — BASIC METABOLIC PANEL
GFR calc Af Amer: >90 mL/min (ref >90)
GFR calc non Af Amer: 80 mL/min — ABNORMAL LOW (ref >90)
Potassium: 4 mEq/L (ref 3.5–5.1)
Sodium: 137 mEq/L (ref 135–145)

## 2012-12-12 NOTE — ED Provider Notes (Signed)
I saw and evaluated the patient, reviewed the resident's note and I agree with the findings and plan.   .Face to face Exam:  General:  Awake HEENT:  Atraumatic Resp:  Normal effort Abd:  Nondistended Neuro:No focal weakness  Nelia Shi, MD 12/12/12 1745

## 2012-12-12 NOTE — ED Notes (Signed)
Per pt sts since he woke up this am he has been feeling dizzy, weak, diaphoresis and nausea. Denies vomiting or diarrhea.

## 2012-12-12 NOTE — ED Notes (Signed)
Dr. Modesto Charon notified that pt. Has heart rate of 44-45.

## 2012-12-12 NOTE — ED Provider Notes (Signed)
CSN: [id number]     Arrival date & time 12/12/12  1156 History   None    Chief Complaint  Patient presents with  . Dizziness  . Weakness   (Consider location/radiation/quality/duration/timing/severity/associated sxs/prior Treatment) Patient is a 72 y.o. male presenting with weakness. The history is provided by the patient. No language interpreter was used.  Weakness This is a new problem. The current episode started today. The problem occurs rarely. The problem has been resolved. Associated symptoms include coughing (chronic), diaphoresis and weakness. Pertinent negatives include no abdominal pain, chest pain, congestion, fever, headaches, myalgias, nausea, numbness, rash, sore throat, urinary symptoms, vertigo, visual change or vomiting. Nothing aggravates the symptoms. He has tried nothing for the symptoms. The treatment provided no relief.    Past Medical History  Diagnosis Date  . Diabetes mellitus     Type II  . Hypertension   . BPH (benign prostatic hyperplasia)   . Carbuncle     recurrent MRSA carbuncles  . Arthritis   . Cataract   . Glaucoma   . Hyperlipidemia   . Disk prolapse   . Sinusitis 07/29/2012   Past Surgical History  Procedure Laterality Date  . Cataract extraction      x 2  . Colonoscopy    . Polypectomy     Family History  Problem Relation Age of Onset  . Cancer Mother   . Hypertension Mother   . Colon cancer Neg Hx   . Heart attack Neg Hx   . Sudden death Neg Hx   . Diabetes Sister   . Cancer Brother     colon  . Hypertension Brother   . Hyperlipidemia Brother   . Stroke Brother   . Hypertension Sister    History  Substance Use Topics  . Smoking status: Former Smoker    Quit date: 10/03/1978  . Smokeless tobacco: Never Used  . Alcohol Use: 2.5 oz/week    5 drink(s) per week     Comment: pint, occasion beer    Review of Systems  Constitutional: Positive for diaphoresis. Negative for fever.  HENT: Negative for congestion, sore throat  and rhinorrhea.   Respiratory: Positive for cough (chronic). Negative for shortness of breath.   Cardiovascular: Negative for chest pain.  Gastrointestinal: Negative for nausea, vomiting, abdominal pain and diarrhea.  Genitourinary: Negative for dysuria and hematuria.  Musculoskeletal: Negative for myalgias.  Skin: Negative for rash.  Neurological: Positive for weakness. Negative for vertigo, syncope, light-headedness, numbness and headaches.  All other systems reviewed and are negative.    Allergies  Review of patient's allergies indicates no known allergies.  Home Medications   Current Outpatient Rx  Name  Route  Sig  Dispense  Refill  . aspirin 81 MG tablet   Oral   Take 81 mg by mouth daily.          . chlorpheniramine-HYDROcodone (TUSSIONEX) 10-8 MG/5ML LQCR   Oral   Take 5 mLs by mouth at bedtime as needed.   140 mL   0   . glucose blood (ONE TOUCH TEST STRIPS) test strip   Other   1 each by Other route as needed. Use as instructed to check blood sugar once daily (dx 250.00)         . hydrocortisone (ANUSOL-HC) 25 MG suppository   Rectal   Place 1 suppository (25 mg total) rectally 2 (two) times daily.   12 suppository   0   . metFORMIN (GLUCOPHAGE) 500 MG tablet  TAKE ONE TABLET TWICE DAILY WITH A MEAL   60 tablet   0     Rx has expired - unused refills remain   . ONE TOUCH LANCETS MISC   Does not apply   by Does not apply route. Use to check blood sugar once daily (dx code 250.00)         . pravastatin (PRAVACHOL) 40 MG tablet      TAKE 1 TABLET DAILY   30 tablet   1   . terazosin (HYTRIN) 2 MG capsule   Oral   Take 1 capsule (2 mg total) by mouth at bedtime.   30 capsule   3    BP 164/81  Pulse 61  Temp(Src) 97.8 F (36.6 C) (Oral)  Resp 18  Wt 202 lb (91.627 kg)  BMI 30.72 kg/m2  SpO2 97% Physical Exam  Nursing note and vitals reviewed. Constitutional: He is oriented to person, place, and time. He appears well-developed and  well-nourished.  HENT:  Head: Normocephalic and atraumatic.  Right Ear: External ear normal.  Left Ear: External ear normal.  Eyes: EOM are normal. Pupils are equal, round, and reactive to light.  Neck: Normal range of motion. Neck supple.  Cardiovascular: Normal rate, regular rhythm and intact distal pulses.  Exam reveals no gallop and no friction rub.   No murmur heard. Pulmonary/Chest: Effort normal and breath sounds normal. No respiratory distress. He has no wheezes. He has no rales. He exhibits no tenderness.  Abdominal: Soft. Bowel sounds are normal. He exhibits no distension. There is no tenderness. There is no rebound.  Musculoskeletal: Normal range of motion. He exhibits no edema and no tenderness.  Lymphadenopathy:    He has no cervical adenopathy.  Neurological: He is alert and oriented to person, place, and time.  Skin: Skin is warm. No rash noted.  Psychiatric: He has a normal mood and affect. His behavior is normal.    ED Course  Procedures (including critical care time) Labs Review Labs Reviewed  CBC - Abnormal; Notable for the following:    WBC 2.5 (*)    HCT 38.8 (*)    Platelets 148 (*)    All other components within normal limits  BASIC METABOLIC PANEL - Abnormal; Notable for the following:    Glucose, Bld 116 (*)    GFR calc non Af Amer 80 (*)    All other components within normal limits  URINALYSIS, ROUTINE W REFLEX MICROSCOPIC  POCT I-STAT TROPONIN I   Imaging Review No results found.  MDM  No diagnosis found. 2:51 PM Pt is a 72 y.o. male with pertinent PMHX of HTN, HLD,DM who presents to the ED with generalized weakness. Pt presents with generalized weakness at 6:30AM. Not similar to previous episodes. Pt denieschest pain, palpitations. Pt denies focal neurologic deficits. Pt endorses diaphoresis, and lightheadedness. No aches or pains. Pt felt like he was not able to get stand up on his feet. No slurred speech or facial droop per wife. No family history  of premature CAD or stroke. Denies dysuria, diarrhea or recent illness or sick contacts  On Exam: AFVSS. Lungs CA, no rubs or murmurs Neurologic exam:CN I-XII: grossly intact, Sensation: normal in upper and lower extremities, Strength 5/5 in both upper and lower extremities, Coordination intact. Gait normal. Plan to obtain iStat Troponin, EKg, CBC, BMP. No stroke like symptoms, normal neurologic exam no recurrent falls, low clinical suspicion for stroke TIA no previous episodes or family history. Will add on LFTs  given pt is on statin. Possible ACS given risk factos: HTN, DM, HLD and previous smoking. Will obtain CXr PA/LAt to rule out infection, chronic cough per pt  EKG personally reviewed by myself showed  Sinus bradycardia Rate of 55, PR , QRS 86ms QT/QTC 416/35ms, normal axis, without evidence of new ischemia. Comparison showed similar, indication: weakness  Review of labs: iStat troponin negative, CBC showed leukopenia on review of labs is chronic, H&H 13.2/38.8, BMP showed no electrolyte abnormality. No elevation of LFTs. Given symptoms start at 6:30 AM with no elevated troponin, low clinical suspicion for ACS or acute MI.  CXR Pa/LAT for cough/dizzy weakness per my read showed no pneumonia, ptx, cardiomegaly  5:13 PM: I have discussed the diagnosis/risks/treatment options with the patient and family and believe the pt to be eligible for discharge home to follow-up with PCP in 1 week. We also discussed returning to the ED immediately if new or worsening sx occur. We discussed the sx which are most concerning (e.g., chest pain,. Shortness of breath, syncope, stroke like symptoms) that necessitate immediate return. Any new prescriptions provided to the patient are listed below.   New Prescriptions   No medications on file    The patient appears reasonably screened and/or stabilized for discharge and I doubt any other medical condition or other [facility] requiring further screening, evaluation or  treatment in the ED at this time prior to discharge . Pt in agreement with discharge plan. Return precautions given. Pt discharged VSS   Labs, EKG and imaging reviewed by myself and considered in medical decision making if ordered.  Imaging interpreted by radiology. Pt was discussed with my attending, Dr. Doristine Mango, MD 12/12/12 [phone]

## 2012-12-14 ENCOUNTER — Other Ambulatory Visit (INDEPENDENT_AMBULATORY_CARE_PROVIDER_SITE_OTHER): Payer: Medicare PPO

## 2012-12-14 DIAGNOSIS — I10 Essential (primary) hypertension: Secondary | ICD-10-CM

## 2012-12-14 DIAGNOSIS — E785 Hyperlipidemia, unspecified: Secondary | ICD-10-CM

## 2012-12-14 DIAGNOSIS — E119 Type 2 diabetes mellitus without complications: Secondary | ICD-10-CM

## 2012-12-14 LAB — HEPATIC FUNCTION PANEL
Albumin: 3.7 g/dL (ref 3.5–5.2)
Alkaline Phosphatase: 38 U/L — ABNORMAL LOW (ref 39–117)
Bilirubin, Direct: 0.1 mg/dL (ref 0.0–0.3)

## 2012-12-14 LAB — COMPREHENSIVE METABOLIC PANEL
ALT: 12 U/L (ref 0–53)
Albumin: 3.7 g/dL (ref 3.5–5.2)
BUN: 15 mg/dL (ref 6–23)
CO2: 28 mEq/L (ref 19–32)
Calcium: 8.8 mg/dL (ref 8.4–10.5)
Chloride: 109 mEq/L (ref 96–112)
Creatinine, Ser: 1.1 mg/dL (ref 0.4–1.5)
GFR: 86.09 mL/min (ref >60.00)

## 2012-12-14 LAB — LIPID PANEL: HDL: 32.4 mg/dL — ABNORMAL LOW (ref >39.00)

## 2012-12-14 LAB — HEMOGLOBIN A1C: Hgb A1c MFr Bld: 6.5 % (ref 4.6–6.5)

## 2012-12-18 ENCOUNTER — Ambulatory Visit (INDEPENDENT_AMBULATORY_CARE_PROVIDER_SITE_OTHER): Payer: Medicare PPO | Admitting: Internal Medicine

## 2012-12-18 ENCOUNTER — Encounter: Payer: Self-pay | Admitting: Internal Medicine

## 2012-12-18 VITALS — BP 136/94 | HR 57 | Temp 97.9°F | Wt 206.8 lb

## 2012-12-18 DIAGNOSIS — R61 Generalized hyperhidrosis: Secondary | ICD-10-CM

## 2012-12-18 DIAGNOSIS — E1169 Type 2 diabetes mellitus with other specified complication: Secondary | ICD-10-CM

## 2012-12-18 DIAGNOSIS — R110 Nausea: Secondary | ICD-10-CM

## 2012-12-18 DIAGNOSIS — N529 Male erectile dysfunction, unspecified: Secondary | ICD-10-CM

## 2012-12-18 NOTE — Patient Instructions (Addendum)
Episode Sept 10th of nausea, weakness and heavy sweating. The evaluation in the ER - normal EKG, normal Chest x-ray, normal lab including heart tests. Your symptoms did resolve. On questioning you give no symptoms to suggest any heart problem. Repeat Lab Sept 12th were like-wise normal. The A1C for blood sugar control was excellent. The cholesterol levels are great. You seem to be doing well today.  The cause of your symptoms remains unknown but it is good to know that what was looked at was normal.  Plan Continue all your present medications without change  For any discomfort, especially chest pressure or pain either at rest or with work - CALL   2. ED - patient reports lack of erection and dimished interest. May be vascular, diabetes or low testosterone  Plan  trial of Viagra.

## 2012-12-18 NOTE — Progress Notes (Signed)
Subjective:    [name], male    DOB: July 25, 1940, 72 y.o.   MRN: [id number]  HPI [name] presents for ED follow up. He had an episode Sept 10th: he awoke feeling weak, nauseated w/o emesis and became diaphoretic. He went to ED: (notes reviewed) - Cmet, troponin I, CBC, CXR, EKG, all normal. He was discharged home. He has not had any recurrence. He denies any decreased exercise tolerance, denies chest pain or pressure. He is active.  Jonathan Lewis is concerned about intermittent discomfort left scrotum/testicle. He also reports complete impotence for some time.  Past Medical History  Diagnosis Date  . Diabetes mellitus     Type II  . Hypertension   . BPH (benign prostatic hyperplasia)   . Carbuncle     recurrent MRSA carbuncles  . Arthritis   . Cataract   . Glaucoma   . Hyperlipidemia   . Disk prolapse   . Sinusitis 07/29/2012   Past Surgical History  Procedure Laterality Date  . Cataract extraction      x 2  . Colonoscopy    . Polypectomy     Family History  Problem Relation Age of Onset  . Cancer Mother   . Hypertension Mother   . Colon cancer Neg Hx   . Heart attack Neg Hx   . Sudden death Neg Hx   . Diabetes Sister   . Cancer Brother     colon  . Hypertension Brother   . Hyperlipidemia Brother   . Stroke Brother   . Hypertension Sister    History   Social History  . Marital Status: Married    Spouse Name: N/A    Number of Children: 7  . Years of Education: 7   Occupational History  . pastor    Social History Main Topics  . Smoking status: Former Smoker    Quit date: 10/03/1978  . Smokeless tobacco: Never Used  . Alcohol Use: 2.5 oz/week    5 drink(s) per week     Comment: pint, occasion beer  . Drug Use: No  . Sexual Activity: Yes    Partners: Female   Other Topics Concern  . Not on file   Social History Narrative   9th grade.  Married '60. [name] 3 sons -'24 '64, '66; 4 dtrs -'60, '62, '61, '65; 15 grands, 25 great-grands.    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   ACD- discussed living will and HCPOA (July '14) provided packet.           Current Outpatient Prescriptions on File Prior to Visit  Medication Sig Dispense Refill  . aspirin 81 MG tablet Take 81 mg by mouth daily.       Marland Kitchen glucose blood (ONE TOUCH TEST STRIPS) test strip 1 each by Other route as needed. Use as instructed to check blood sugar once daily (dx 250.00)      . metFORMIN (GLUCOPHAGE) 500 MG tablet Take 500 mg by mouth 2 (two) times daily with a meal.      . ONE TOUCH LANCETS MISC by Does not apply route. Use to check blood sugar once daily (dx code 250.00)      . pravastatin (PRAVACHOL) 40 MG tablet Take 40 mg by mouth daily.      Marland Kitchen terazosin (HYTRIN) 2 MG capsule Take 1 capsule (2 mg total) by mouth at bedtime.  30 capsule  3   No current facility-administered medications on file prior to  visit.      Review of Systems System review is negative for any constitutional, cardiac, pulmonary, GI or neuro symptoms or complaints other than as described in the HPI.     Objective:   Physical Exam Filed Vitals:   12/18/12 1522  BP: 136/94  Pulse: 57  Temp: 97.9 F (36.6 C)   Gen'l - WNWD AA man in no distress HEENT - C&S clear Cor 2+ radial, RRR Pulm - normal Genitalia - normal shaft, atrophic testicles w/o masses or tenderness, no inguinal bulge. Neuro - A&O x3, CN II-XII grossly intact, normal stand and go, normal gait.        Assessment & Plan:  `1. Episode Sept 10th of nausea, weakness and heavy sweating. The evaluation in the ER - normal EKG, normal Chest x-ray, normal lab including heart tests. Your symptoms did resolve. On questioning you give no symptoms to suggest any heart problem. Repeat Lab Sept 12th were like-wise normal. The A1C for blood sugar control was excellent. The cholesterol levels are great. You seem to be doing well today.  The cause of your symptoms remains unknown but it is good to know that  what was looked at was normal.  Plan Continue all your present medications without change  For any discomfort, especially chest pressure or pain either at rest or with work - CALL   2. ED - patient reports lack of erection and dimished interest. May be vascular, diabetes or low testosterone  Plan  trial of Viagra.

## 2012-12-24 ENCOUNTER — Other Ambulatory Visit: Payer: Self-pay | Admitting: Internal Medicine

## 2013-01-02 ENCOUNTER — Ambulatory Visit: Payer: Medicare PPO | Admitting: Internal Medicine

## 2013-02-25 ENCOUNTER — Other Ambulatory Visit: Payer: Self-pay | Admitting: Internal Medicine

## 2013-02-26 ENCOUNTER — Ambulatory Visit (INDEPENDENT_AMBULATORY_CARE_PROVIDER_SITE_OTHER): Payer: Medicare PPO

## 2013-02-26 DIAGNOSIS — Z23 Encounter for immunization: Secondary | ICD-10-CM

## 2013-06-03 ENCOUNTER — Other Ambulatory Visit: Payer: Self-pay | Admitting: Internal Medicine

## 2013-06-13 ENCOUNTER — Other Ambulatory Visit: Payer: Self-pay | Admitting: Internal Medicine

## 2013-06-16 ENCOUNTER — Other Ambulatory Visit: Payer: Self-pay | Admitting: Internal Medicine

## 2013-06-17 ENCOUNTER — Encounter: Payer: Self-pay | Admitting: Internal Medicine

## 2013-06-17 ENCOUNTER — Ambulatory Visit (INDEPENDENT_AMBULATORY_CARE_PROVIDER_SITE_OTHER): Payer: Medicare PPO | Admitting: Internal Medicine

## 2013-06-17 VITALS — BP 148/100 | HR 56 | Temp 98.1°F | Wt 203.8 lb

## 2013-06-17 DIAGNOSIS — M545 Low back pain, unspecified: Secondary | ICD-10-CM

## 2013-06-17 DIAGNOSIS — E785 Hyperlipidemia, unspecified: Secondary | ICD-10-CM

## 2013-06-17 DIAGNOSIS — I10 Essential (primary) hypertension: Secondary | ICD-10-CM

## 2013-06-17 DIAGNOSIS — D72819 Decreased white blood cell count, unspecified: Secondary | ICD-10-CM

## 2013-06-17 DIAGNOSIS — E119 Type 2 diabetes mellitus without complications: Secondary | ICD-10-CM

## 2013-06-17 MED ORDER — TERAZOSIN HCL 2 MG PO CAPS: ORAL_CAPSULE | ORAL | Status: DC

## 2013-06-17 NOTE — Patient Instructions (Signed)
Thanks for coming in.  1. Blood pressure:  BP Readings from Last 3 Encounters:  06/17/13 148/100  12/18/12 136/94  12/12/12 166/85   Running a bit high at times. Plan Blood pressure medication - lisinopril 10 mg (ACE-I) that is good for blood  Pressure and protects the kidney in a diabetic  Please monitor blood pressure at home every day for 1-2 weeks as we start medication  Continue hytrin  Lab in one month to check kidney function on new medication.  2. Diabetes- stay on the metformin and continue to monitor your blood sugar 3 times a week.  Lab  - in one month to check the A1C  3. Cholesterol - continue your present medication - pravastatin  Lab - in one month to check a lipid profile  4. Leg pain - no problems in the back on exam today. There may be early arthritis in the hip and knee but no limitation in movement on exam.  Plan Try taking generic aleve twice a day: AM and PM              Watch for any stomach irritation, change in bowel habit, especially if the stools turn dark: stop medication and call.   5. Glaucoma - good thing you are seeing the eye doctor to manage this.   You will see Dr. Jenny Reichmann in follow up after your lab is done in a month.

## 2013-06-17 NOTE — Assessment & Plan Note (Signed)
BP Readings from Last 3 Encounters:  06/17/13 148/100  12/18/12 136/94  12/12/12 166/85   Running a bit high at times. Plan Blood pressure medication - lisinopril 10 mg (ACE-I) that is good for blood  Pressure and protects the kidney in a diabetic  Please monitor blood pressure at home every day for 1-2 weeks as we start medication  Continue hytrin  Lab in one month to check kidney function on new medication.

## 2013-06-17 NOTE — Assessment & Plan Note (Signed)
Lab Results  Component Value Date   WBC 2.5* 12/12/2012   HGB 13.2 12/12/2012   HCT 38.8* 12/12/2012   MCV 83.4 12/12/2012   PLT 148* 12/12/2012   Will follow up with repeat lab. No report of any infectious issues.

## 2013-06-17 NOTE — Progress Notes (Signed)
Subjective:    [name], male    DOB: July 02, 1940, 73 y.o.   MRN: [id number]  HPI Jonathan Lewis has pain right leg since before September 2104 with radiation from hip to ankle. The pain is at night when he lays on the right side. The knee and ankle hurt every day and the ankle is worse when he drives. He has taken no medication for this.   He otherwise has been healthy and doing well. NO major illness, surgery or injury. No trips to the ED. He checks his CBG frequently - good control. He has not checked his BP. He has seen eye doctor last month - newly diagnosed with glaucoma and started on drops.   Past Medical History  Diagnosis Date  . Diabetes mellitus     Type II  . Hypertension   . BPH (benign prostatic hyperplasia)   . Carbuncle     recurrent MRSA carbuncles  . Arthritis   . Cataract   . Glaucoma   . Hyperlipidemia   . Disk prolapse   . Sinusitis 07/29/2012   Past Surgical History  Procedure Laterality Date  . Cataract extraction      x 2  . Colonoscopy    . Polypectomy     Family History  Problem Relation Age of Onset  . Cancer Mother   . Hypertension Mother   . Colon cancer Neg Hx   . Heart attack Neg Hx   . Sudden death Neg Hx   . Diabetes Sister   . Cancer Brother     colon  . Hypertension Brother   . Hyperlipidemia Brother   . Stroke Brother   . Hypertension Sister    History   Social History  . Marital Status: Married    Spouse Name: N/A    Number of Children: 7  . Years of Education: 7   Occupational History  . pastor    Social History Main Topics  . Smoking status: Former Smoker    Quit date: 10/03/1978  . Smokeless tobacco: Never Used  . Alcohol Use: 2.5 oz/week    5 drink(s) per week     Comment: pint, occasion beer  . Drug Use: No  . Sexual Activity: Yes    Partners: Female   Other Topics Concern  . Not on file   Social History Narrative   9th grade.  Married '60. [name] 3 sons -'30 '64, '66; 4 dtrs -'60, '62,  '61, '65; 15 grands, 25 great-grands.   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   ACD- discussed living will and HCPOA (July '14) provided packet.           Current Outpatient Prescriptions on File Prior to Visit  Medication Sig Dispense Refill  . aspirin 81 MG tablet Take 81 mg by mouth daily.       Marland Kitchen glucose blood (ONE TOUCH TEST STRIPS) test strip 1 each by Other route as needed. Use as instructed to check blood sugar once daily (dx 250.00)      . metFORMIN (GLUCOPHAGE) 500 MG tablet TAKE 1 TABLET BY MOUTH TWICE A DAY WITH MEALS  60 tablet  5  . ONE TOUCH LANCETS MISC by Does not apply route. Use to check blood sugar once daily (dx code 250.00)      . pravastatin (PRAVACHOL) 40 MG tablet TAKE 1 TABLET BY MOUTH EVERY DAY  30 tablet  5   No current facility-administered medications on file  prior to visit.      Review of Systems System review is negative for any constitutional, cardiac, pulmonary, GI or neuro symptoms or complaints other than as described in the HPI.     Objective:   Physical Exam Filed Vitals:   06/17/13 0923  BP: 148/100  Pulse: 56  Temp: 98.1 F (36.7 C)   Wt Readings from Last 3 Encounters:  06/17/13 203 lb 12.8 oz (92.443 kg)  12/18/12 206 lb 12.8 oz (93.804 kg)  12/12/12 202 lb (91.627 kg)   WNWD man in no distress HEENT- normal Neck- supple Cor- 2+ radial, RRR Pulm - Normal respirations MSK- normal leg movement, no pain with internal or external rotation right hip. Back exam: normal stand; normal flex to greater than 100 degrees; normal gait; normal toe/heel walk; normal step up to exam table; normal SLR sitting; normal DTRs at the patellar tendons; normal sensation to light touch, pin-prick and deep vibratory stimulus; no  CVA tenderness; able to move supine to sitting witout assistance.        Assessment & Plan:

## 2013-06-17 NOTE — Assessment & Plan Note (Signed)
MRI with DDD especially at L4-5. Saw Dr. Earnie Larsson in the past - for medical therapy. Now with leg pain which may be spinal nerve root related. No problems in the back on exam today. There may be early arthritis in the hip and knee but no limitation in movement on exam.  Plan  Try taking generic aleve twice a day: AM and PM              Watch for any stomach irritation, change in bowel habit, especially if the stools turn dark: stop medication and call.

## 2013-06-17 NOTE — Assessment & Plan Note (Signed)
Taking and tolerating "statin" therapy. Cholesterol - continue your present medication - pravastatin  Lab - in one month to check a lipid profile

## 2013-06-17 NOTE — Assessment & Plan Note (Signed)
Diabetes- stay on the metformin and continue to monitor your blood sugar 3 times a week.  Lab  - in one month to check the A1C

## 2013-06-17 NOTE — Progress Notes (Signed)
Pre visit review using our clinic review tool, if applicable. No additional management support is needed unless otherwise documented below in the visit note. 

## 2013-06-18 ENCOUNTER — Telehealth: Payer: Self-pay | Admitting: Internal Medicine

## 2013-06-18 NOTE — Telephone Encounter (Signed)
Relevant patient education mailed to patient.  

## 2013-06-19 ENCOUNTER — Telehealth: Payer: Self-pay | Admitting: Internal Medicine

## 2013-06-19 MED ORDER — LISINOPRIL 10 MG PO TABS: 10.0000 mg | ORAL_TABLET | Freq: Every day | ORAL | Status: DC

## 2013-06-19 NOTE — Telephone Encounter (Signed)
Pt thought a new medicine was going to be called in for his blood pressure.  The call got cut off before I could get his pharmacy.

## 2013-06-19 NOTE — Telephone Encounter (Signed)
Lisinopril 10 mg daily, Rx sent to pharmacy

## 2013-06-19 NOTE — Telephone Encounter (Signed)
Note already generated regarding BP medication

## 2013-06-19 NOTE — Telephone Encounter (Signed)
Pt call stated that he thought Dr. Linda Hedges was going to call Lisinopril 10 mg in for in for his BP from the last ov. Please check.

## 2013-06-19 NOTE — Telephone Encounter (Signed)
Dr Linda Hedges, what blood pressure medicine should patient be on?

## 2013-06-21 ENCOUNTER — Telehealth: Payer: Self-pay

## 2013-06-21 NOTE — Telephone Encounter (Signed)
Relevant patient education mailed to patient.  

## 2013-07-15 ENCOUNTER — Other Ambulatory Visit (INDEPENDENT_AMBULATORY_CARE_PROVIDER_SITE_OTHER): Payer: Medicare PPO

## 2013-07-15 DIAGNOSIS — I10 Essential (primary) hypertension: Secondary | ICD-10-CM

## 2013-07-15 DIAGNOSIS — E785 Hyperlipidemia, unspecified: Secondary | ICD-10-CM

## 2013-07-15 DIAGNOSIS — E119 Type 2 diabetes mellitus without complications: Secondary | ICD-10-CM

## 2013-07-15 DIAGNOSIS — D72819 Decreased white blood cell count, unspecified: Secondary | ICD-10-CM

## 2013-07-15 LAB — LIPID PANEL
Cholesterol: 145 mg/dL (ref 0–200)
HDL: 35.2 mg/dL — ABNORMAL LOW (ref >39.00)
LDL Cholesterol: 92 mg/dL (ref 0–99)
Total CHOL/HDL Ratio: 4
Triglycerides: 89 mg/dL (ref 0.0–149.0)
VLDL: 17.8 mg/dL (ref 0.0–40.0)

## 2013-07-15 LAB — CBC WITH DIFFERENTIAL/PLATELET
BASOS ABS: 0 10*3/uL (ref 0.0–0.1)
BASOS PCT: 0.4 % (ref 0.0–3.0)
Eosinophils Absolute: 0 10*3/uL (ref 0.0–0.7)
Eosinophils Relative: 1.7 % (ref 0.0–5.0)
HCT: 37.4 % — ABNORMAL LOW (ref 39.0–52.0)
Hemoglobin: 12.3 g/dL — ABNORMAL LOW (ref 13.0–17.0)
Lymphocytes Relative: 46.3 % — ABNORMAL HIGH (ref 12.0–46.0)
Lymphs Abs: 1.2 10*3/uL (ref 0.7–4.0)
MCHC: 32.9 g/dL (ref 30.0–36.0)
MCV: 86.6 fl (ref 78.0–100.0)
MONO ABS: 0.3 10*3/uL (ref 0.1–1.0)
Monocytes Relative: 13.1 % — ABNORMAL HIGH (ref 3.0–12.0)
Neutro Abs: 1 10*3/uL — ABNORMAL LOW (ref 1.4–7.7)
Neutrophils Relative %: 38.5 % — ABNORMAL LOW (ref 43.0–77.0)
Platelets: 162 10*3/uL (ref 150.0–400.0)
RBC: 4.32 Mil/uL (ref 4.22–5.81)
RDW: 12.8 % (ref 11.5–14.6)
WBC: 2.7 10*3/uL — ABNORMAL LOW (ref 4.5–10.5)

## 2013-07-15 LAB — BASIC METABOLIC PANEL
BUN: 14 mg/dL (ref 6–23)
CHLORIDE: 108 meq/L (ref 96–112)
CO2: 28 meq/L (ref 19–32)
CREATININE: 1 mg/dL (ref 0.4–1.5)
Calcium: 9 mg/dL (ref 8.4–10.5)
GFR: 97.3 mL/min (ref >60.00)
Glucose, Bld: 111 mg/dL — ABNORMAL HIGH (ref 70–99)
POTASSIUM: 4.6 meq/L (ref 3.5–5.1)
Sodium: 141 mEq/L (ref 135–145)

## 2013-07-15 LAB — HEPATIC FUNCTION PANEL
ALK PHOS: 39 U/L (ref 39–117)
ALT: 16 U/L (ref 0–53)
AST: 22 U/L (ref 0–37)
Albumin: 3.6 g/dL (ref 3.5–5.2)
BILIRUBIN DIRECT: 0 mg/dL (ref 0.0–0.3)
BILIRUBIN TOTAL: 0.4 mg/dL (ref 0.3–1.2)
Total Protein: 6.5 g/dL (ref 6.0–8.3)

## 2013-07-15 LAB — HEMOGLOBIN A1C: HEMOGLOBIN A1C: 6.3 % (ref 4.6–6.5)

## 2013-07-17 ENCOUNTER — Encounter: Payer: Self-pay | Admitting: Internal Medicine

## 2013-07-17 ENCOUNTER — Other Ambulatory Visit (INDEPENDENT_AMBULATORY_CARE_PROVIDER_SITE_OTHER): Payer: Medicare PPO

## 2013-07-17 ENCOUNTER — Ambulatory Visit (INDEPENDENT_AMBULATORY_CARE_PROVIDER_SITE_OTHER): Payer: Medicare PPO | Admitting: Internal Medicine

## 2013-07-17 VITALS — BP 112/78 | HR 69 | Temp 98.9°F | Ht 68.0 in | Wt 205.8 lb

## 2013-07-17 DIAGNOSIS — M5430 Sciatica, unspecified side: Secondary | ICD-10-CM

## 2013-07-17 DIAGNOSIS — Z0001 Encounter for general adult medical examination with abnormal findings: Secondary | ICD-10-CM | POA: Insufficient documentation

## 2013-07-17 DIAGNOSIS — E119 Type 2 diabetes mellitus without complications: Secondary | ICD-10-CM

## 2013-07-17 DIAGNOSIS — I10 Essential (primary) hypertension: Secondary | ICD-10-CM

## 2013-07-17 DIAGNOSIS — Z0000 Encounter for general adult medical examination without abnormal findings: Secondary | ICD-10-CM

## 2013-07-17 DIAGNOSIS — Z23 Encounter for immunization: Secondary | ICD-10-CM

## 2013-07-17 DIAGNOSIS — M5431 Sciatica, right side: Secondary | ICD-10-CM

## 2013-07-17 DIAGNOSIS — Z125 Encounter for screening for malignant neoplasm of prostate: Secondary | ICD-10-CM

## 2013-07-17 LAB — PSA: PSA: 1.77 ng/mL (ref 0.10–4.00)

## 2013-07-17 NOTE — Addendum Note (Signed)
Addended by: Sharon Seller B on: 07/17/2013 12:29 PM   Modules accepted: Orders

## 2013-07-17 NOTE — Progress Notes (Signed)
Pre visit review using our clinic review tool, if applicable. No additional management support is needed unless otherwise documented below in the visit note. 

## 2013-07-17 NOTE — Assessment & Plan Note (Signed)
For tylenol prn, exam no change neuro

## 2013-07-17 NOTE — Progress Notes (Signed)
Subjective:    [name], male    DOB: Dec 06, 1940, 73 y.o.   MRN: [id number]  HPI  Here for wellness and f/u;  Overall doing ok;  Pt denies CP, worsening SOB, DOE, wheezing, orthopnea, PND, worsening LE edema, palpitations, dizziness or syncope.  Pt denies neurological change such as new headache, facial or extremity weakness.  Pt denies polydipsia, polyuria, or low sugar symptoms. Pt states overall good compliance with treatment and medications, good tolerability, and has been trying to follow lower cholesterol diet.  Pt denies worsening depressive symptoms, suicidal ideation or panic. No fever, night sweats, wt loss, loss of appetite, or other constitutional symptoms.  Pt states good ability with ADL's, has low fall risk, home safety reviewed and adequate, no other significant changes in hearing or vision, and only occasionally active with exercise.  Tolerating the norvasc, doing well.   Pt continues to have recurring right LBP with radiatoin to distal RLE, worse to drive, worse to lie down at night, but no bowel or bladder change, fever, wt loss,  worsening LE numbness/weakness, gait change or falls. Past Medical History  Diagnosis Date  . Diabetes mellitus     Type II  . Hypertension   . BPH (benign prostatic hyperplasia)   . Carbuncle     recurrent MRSA carbuncles  . Arthritis   . Cataract   . Glaucoma   . Hyperlipidemia   . Disk prolapse   . Sinusitis 07/29/2012   Past Surgical History  Procedure Laterality Date  . Cataract extraction      x 2  . Colonoscopy    . Polypectomy      reports that he quit smoking about 34 years ago. He has never used smokeless tobacco. He reports that he drinks about 2.5 ounces of alcohol per week. He reports that he does not use illicit drugs. family history includes Cancer in his brother and mother; Diabetes in his sister; Hyperlipidemia in his brother; Hypertension in his brother, mother, and sister; Stroke in his brother. There is  no history of Colon cancer, Heart attack, or Sudden death. No Known Allergies Current Outpatient Prescriptions on File Prior to Visit  Medication Sig Dispense Refill  . aspirin 81 MG tablet Take 81 mg by mouth daily.       Marland Kitchen glucose blood (ONE TOUCH TEST STRIPS) test strip 1 each by Other route as needed. Use as instructed to check blood sugar once daily (dx 250.00)      . lisinopril (PRINIVIL,ZESTRIL) 10 MG tablet Take 1 tablet (10 mg total) by mouth daily.  30 tablet  3  . metFORMIN (GLUCOPHAGE) 500 MG tablet TAKE 1 TABLET BY MOUTH TWICE A DAY WITH MEALS  60 tablet  5  . ONE TOUCH LANCETS MISC by Does not apply route. Use to check blood sugar once daily (dx code 250.00)      . pravastatin (PRAVACHOL) 40 MG tablet TAKE 1 TABLET BY MOUTH EVERY DAY  30 tablet  5  . terazosin (HYTRIN) 2 MG capsule TAKE 1 CAPSULE BY MOUTH AT BEDTIME.  30 capsule  5   No current facility-administered medications on file prior to visit.   Review of Systems Constitutional: Negative for diaphoresis, activity change, appetite change or unexpected weight change.  HENT: Negative for hearing loss, ear pain, facial swelling, mouth sores and neck stiffness.   Eyes: Negative for pain, redness and visual disturbance.  Respiratory: Negative for shortness of breath and wheezing.   Cardiovascular:  Negative for chest pain and palpitations.  Gastrointestinal: Negative for diarrhea, blood in stool, abdominal distention or other pain Genitourinary: Negative for hematuria, flank pain or change in urine volume.  Musculoskeletal: Negative for myalgias and joint swelling.  Skin: Negative for color change and wound.  Neurological: Negative for syncope and numbness. other than noted Hematological: Negative for adenopathy.  Psychiatric/Behavioral: Negative for hallucinations, self-injury, decreased concentration and agitation.      Objective:   Physical Exam BP 112/78  Pulse 69  Temp(Src) 98.9 F (37.2 C) (Oral)  Ht 5\' 8"   (1.727 m)  Wt 205 lb 12 oz (93.328 kg)  BMI 31.29 kg/m2  SpO2 94% VS noted,  Constitutional: Pt is oriented to person, place, and time. Appears well-developed and well-nourished.  Head: Normocephalic and atraumatic.  Right Ear: External ear normal.  Left Ear: External ear normal.  Nose: Nose normal.  Mouth/Throat: Oropharynx is clear and moist.  Eyes: Conjunctivae and EOM are normal. Pupils are equal, round, and reactive to light.  Neck: Normal range of motion. Neck supple. No JVD present. No tracheal deviation present.  Cardiovascular: Normal rate, regular rhythm, normal heart sounds and intact distal pulses.   Pulmonary/Chest: Effort normal and breath sounds normal.  Abdominal: Soft. Bowel sounds are normal. There is no tenderness. No HSM  Musculoskeletal: Normal range of motion. Exhibits no edema.  Lymphadenopathy:  Has no cervical adenopathy.  Neurological: Pt is alert and oriented to person, place, and time. Pt has normal reflexes. No cranial nerve deficit. Motor 5/5 Skin: Skin is warm and dry. No rash noted.  Psychiatric:  Has  normal mood and affect. Behavior is normal.     Assessment & Plan:

## 2013-07-17 NOTE — Patient Instructions (Addendum)
You had the new Prevnar pneumonia shot today  Please continue all other medications as before, and refills have been done if requested. Please have the pharmacy call with any other refills you may need.  Please continue your efforts at being more active, low cholesterol diet, and weight control. You are otherwise up to date with prevention measures today.  Please keep your appointments with your specialists as you may have planned  Please go to the LAB in the Basement (turn left off the elevator) for the tests to be done today - JUST THE PSA today  Please return in 6 months, or sooner if needed, with other Lab testing done 3-5 days before

## 2013-07-17 NOTE — Assessment & Plan Note (Signed)
Improved, stable overall by history and exam, recent data reviewed with pt, and pt to continue medical treatment as before,  to f/u any worsening symptoms or concerns BP Readings from Last 3 Encounters:  07/17/13 112/78  06/17/13 148/100  12/18/12 136/94

## 2013-07-17 NOTE — Assessment & Plan Note (Signed)

## 2013-07-17 NOTE — Assessment & Plan Note (Signed)
stable overall by history and exam, recent data reviewed with pt, and pt to continue medical treatment as before,  to f/u any worsening symptoms or concerns. Lab Results  Component Value Date   HGBA1C 6.3 07/15/2013

## 2013-10-11 ENCOUNTER — Other Ambulatory Visit: Payer: Self-pay

## 2013-10-11 MED ORDER — LISINOPRIL 10 MG PO TABS: 10.0000 mg | ORAL_TABLET | Freq: Every day | ORAL | Status: DC

## 2013-12-31 ENCOUNTER — Encounter: Payer: Self-pay | Admitting: Gastroenterology

## 2014-01-06 ENCOUNTER — Telehealth: Payer: Self-pay

## 2014-01-06 MED ORDER — TERAZOSIN HCL 2 MG PO CAPS: ORAL_CAPSULE | ORAL | Status: DC

## 2014-01-06 NOTE — Telephone Encounter (Signed)
Refill request for terazosin 2 mg cap, take 1 cap po qhs, qty 30 w/ 5 rfs, rx done and sent to pharmacy

## 2014-01-15 ENCOUNTER — Other Ambulatory Visit (INDEPENDENT_AMBULATORY_CARE_PROVIDER_SITE_OTHER): Payer: Medicare PPO

## 2014-01-15 DIAGNOSIS — E119 Type 2 diabetes mellitus without complications: Secondary | ICD-10-CM

## 2014-01-15 LAB — BASIC METABOLIC PANEL
BUN: 14 mg/dL (ref 6–23)
CALCIUM: 9 mg/dL (ref 8.4–10.5)
CO2: 28 mEq/L (ref 19–32)
Chloride: 106 mEq/L (ref 96–112)
Creatinine, Ser: 1 mg/dL (ref 0.4–1.5)
GFR: 93.81 mL/min (ref >60.00)
GLUCOSE: 93 mg/dL (ref 70–99)
Potassium: 4.3 mEq/L (ref 3.5–5.1)
Sodium: 137 mEq/L (ref 135–145)

## 2014-01-15 LAB — HEPATIC FUNCTION PANEL
ALT: 17 U/L (ref 0–53)
AST: 22 U/L (ref 0–37)
Albumin: 3.5 g/dL (ref 3.5–5.2)
Alkaline Phosphatase: 43 U/L (ref 39–117)
BILIRUBIN DIRECT: 0.1 mg/dL (ref 0.0–0.3)
BILIRUBIN TOTAL: 0.6 mg/dL (ref 0.2–1.2)
TOTAL PROTEIN: 7.4 g/dL (ref 6.0–8.3)

## 2014-01-15 LAB — LIPID PANEL
CHOLESTEROL: 167 mg/dL (ref 0–200)
HDL: 29.9 mg/dL — ABNORMAL LOW (ref >39.00)
LDL CALC: 120 mg/dL — AB (ref 0–99)
NonHDL: 137.1
TRIGLYCERIDES: 84 mg/dL (ref 0.0–149.0)
Total CHOL/HDL Ratio: 6
VLDL: 16.8 mg/dL (ref 0.0–40.0)

## 2014-01-15 LAB — HEMOGLOBIN A1C: Hgb A1c MFr Bld: 6.2 % (ref 4.6–6.5)

## 2014-01-16 ENCOUNTER — Ambulatory Visit (INDEPENDENT_AMBULATORY_CARE_PROVIDER_SITE_OTHER): Payer: Medicare PPO | Admitting: Internal Medicine

## 2014-01-16 ENCOUNTER — Encounter: Payer: Self-pay | Admitting: Internal Medicine

## 2014-01-16 VITALS — BP 136/78 | HR 58 | Temp 98.4°F | Ht 68.0 in | Wt 199.2 lb

## 2014-01-16 DIAGNOSIS — Z23 Encounter for immunization: Secondary | ICD-10-CM

## 2014-01-16 DIAGNOSIS — E119 Type 2 diabetes mellitus without complications: Secondary | ICD-10-CM

## 2014-01-16 DIAGNOSIS — E785 Hyperlipidemia, unspecified: Secondary | ICD-10-CM

## 2014-01-16 DIAGNOSIS — Z0000 Encounter for general adult medical examination without abnormal findings: Secondary | ICD-10-CM

## 2014-01-16 DIAGNOSIS — I10 Essential (primary) hypertension: Secondary | ICD-10-CM

## 2014-01-16 DIAGNOSIS — F528 Other sexual dysfunction not due to a substance or known physiological condition: Secondary | ICD-10-CM

## 2014-01-16 DIAGNOSIS — Z0189 Encounter for other specified special examinations: Secondary | ICD-10-CM

## 2014-01-16 MED ORDER — SILDENAFIL CITRATE 20 MG PO TABS
ORAL_TABLET | ORAL | Status: DC
Start: 2014-01-16 — End: 2014-07-18

## 2014-01-16 NOTE — Progress Notes (Signed)
Pre visit review using our clinic review tool, if applicable. No additional management support is needed unless otherwise documented below in the visit note. 

## 2014-01-16 NOTE — Patient Instructions (Addendum)
You had the flu shot today  Please start the daily Miralax for bowels  Please take all new medication as prescribed - the generic for viagra  Please continue all other medications as before, and refills have been done if requested.  Please have the pharmacy call with any other refills you may need.  Please continue your efforts at being more active, low cholesterol diet, and weight control.  You are otherwise up to date with prevention measures today.  Please keep your appointments with your specialists as you may have planned  Please return in 6 months, or sooner if needed, with Lab testing done 3-5 days before, including the testosterone level

## 2014-01-16 NOTE — Progress Notes (Signed)
Subjective:    [name], male    DOB: [birth year], 73 y.o.   MRN: [id number]  HPI  Here to f/u; overall doing ok,  Pt denies chest pain, increased sob or doe, wheezing, orthopnea, PND, increased LE swelling, palpitations, dizziness or syncope.  Pt denies polydipsia, polyuria, or low sugar symptoms such as weakness or confusion improved with po intake.  Pt denies new neurological symptoms such as new headache, or facial or extremity weakness or numbness.   Pt states overall good compliance with meds, has been trying to follow lower cholesterol, diabetic diet, with wt overall stable,  but little exercise however. Has ongoing ED issue, viagra did not seem to help.  Denies worsening reflux, abd pain, dysphagia, n/v, or blood, but has some occas constipation.    Past Medical History  Diagnosis Date  . Diabetes mellitus     Type II  . Hypertension   . BPH (benign prostatic hyperplasia)   . Carbuncle     recurrent MRSA carbuncles  . Arthritis   . Cataract   . Glaucoma   . Hyperlipidemia   . Disk prolapse   . Sinusitis 07/29/2012   Past Surgical History  Procedure Laterality Date  . Cataract extraction      x 2  . Colonoscopy    . Polypectomy      reports that he quit smoking about 35 years ago. He has never used smokeless tobacco. He reports that he drinks about 2.5 ounces of alcohol per week. He reports that he does not use illicit drugs. family history includes Cancer in his brother and mother; Diabetes in his sister; Hyperlipidemia in his brother; Hypertension in his brother, mother, and sister; Stroke in his brother. There is no history of Colon cancer, Heart attack, or Sudden death. No Known Allergies Current Outpatient Prescriptions on File Prior to Visit  Medication Sig Dispense Refill  . aspirin 81 MG tablet Take 81 mg by mouth daily.       Marland Kitchen glucose blood (ONE TOUCH TEST STRIPS) test strip 1 each by Other route as needed. Use as instructed to check blood sugar  once daily (dx 250.00)      . lisinopril (PRINIVIL,ZESTRIL) 10 MG tablet Take 1 tablet (10 mg total) by mouth daily.  30 tablet  11  . metFORMIN (GLUCOPHAGE) 500 MG tablet TAKE 1 TABLET BY MOUTH TWICE A DAY WITH MEALS  60 tablet  5  . ONE TOUCH LANCETS MISC by Does not apply route. Use to check blood sugar once daily (dx code 250.00)      . pravastatin (PRAVACHOL) 40 MG tablet TAKE 1 TABLET BY MOUTH EVERY DAY  30 tablet  5  . terazosin (HYTRIN) 2 MG capsule TAKE 1 CAPSULE BY MOUTH AT BEDTIME.  30 capsule  5   No current facility-administered medications on file prior to visit.    Review of Systems  Constitutional: Negative for unusual diaphoresis or other sweats  HENT: Negative for ringing in ear Eyes: Negative for double vision or worsening visual disturbance.  Respiratory: Negative for choking and stridor.   Gastrointestinal: Negative for vomiting or other signifcant bowel change Genitourinary: Negative for hematuria or decreased urine volume.  Musculoskeletal: Negative for other MSK pain or swelling Skin: Negative for color change and worsening wound.  Neurological: Negative for tremors and numbness other than noted  Psychiatric/Behavioral: Negative for decreased concentration or agitation other than above       Objective:   Physical Exam  BP 136/78  Pulse 58  Temp(Src) 98.4 F (36.9 C) (Oral)  Ht 5\' 8"  (1.727 m)  Wt 199 lb 4 oz (90.379 kg)  BMI 30.30 kg/m2  SpO2 98% VS noted,  Constitutional: Pt appears well-developed, well-nourished.  HENT: Head: NCAT.  Right Ear: External ear normal.  Left Ear: External ear normal.  Eyes: . Pupils are equal, round, and reactive to light. Conjunctivae and EOM are normal Neck: Normal range of motion. Neck supple.  Cardiovascular: Normal rate and regular rhythm.   Pulmonary/Chest: Effort normal and breath sounds normal.  Abd:  Soft, NT, ND, + BS Neurological: Pt is alert. Not confused , motor grossly intact Skin: Skin is warm. No  rash Psychiatric: Pt behavior is normal. No agitation.     Assessment & Plan:

## 2014-01-18 NOTE — Assessment & Plan Note (Signed)
cialis too expensive, for generic viagra asd prn,  to f/u any worsening symptoms or concerns

## 2014-01-18 NOTE — Assessment & Plan Note (Signed)
stable overall by history and exam, recent data reviewed with pt, and pt to continue medical treatment as before,  to f/u any worsening symptoms or concerns Lab Results  Component Value Date   HGBA1C 6.2 01/15/2014

## 2014-01-18 NOTE — Assessment & Plan Note (Signed)
stable overall by history and exam, recent data reviewed with pt, and pt to continue medical treatment as before,  to f/u any worsening symptoms or concerns BP Readings from Last 3 Encounters:  01/16/14 136/78  07/17/13 112/78  06/17/13 148/100

## 2014-01-18 NOTE — Assessment & Plan Note (Signed)
stable overall by history and exam, recent data reviewed with pt, and pt to continue medical treatment as before,  to f/u any worsening symptoms or concerns Lab Results  Component Value Date   LDLCALC 120* 01/15/2014   For lower chol diet as LDL was previously improved over this, consider increased statin if not better next visit

## 2014-01-28 ENCOUNTER — Other Ambulatory Visit: Payer: Self-pay

## 2014-01-28 MED ORDER — PRAVASTATIN SODIUM 40 MG PO TABS: 40.0000 mg | ORAL_TABLET | Freq: Every day | ORAL | Status: DC

## 2014-07-15 ENCOUNTER — Other Ambulatory Visit (INDEPENDENT_AMBULATORY_CARE_PROVIDER_SITE_OTHER): Payer: Medicare PPO

## 2014-07-15 DIAGNOSIS — Z0189 Encounter for other specified special examinations: Secondary | ICD-10-CM | POA: Diagnosis not present

## 2014-07-15 DIAGNOSIS — R7989 Other specified abnormal findings of blood chemistry: Secondary | ICD-10-CM | POA: Diagnosis not present

## 2014-07-15 DIAGNOSIS — N400 Enlarged prostate without lower urinary tract symptoms: Secondary | ICD-10-CM

## 2014-07-15 DIAGNOSIS — F528 Other sexual dysfunction not due to a substance or known physiological condition: Secondary | ICD-10-CM

## 2014-07-15 DIAGNOSIS — Z0000 Encounter for general adult medical examination without abnormal findings: Secondary | ICD-10-CM

## 2014-07-15 DIAGNOSIS — E119 Type 2 diabetes mellitus without complications: Secondary | ICD-10-CM

## 2014-07-15 LAB — CBC WITH DIFFERENTIAL/PLATELET
BASOS ABS: 0 10*3/uL (ref 0.0–0.1)
Basophils Relative: 0.6 % (ref 0.0–3.0)
EOS ABS: 0.1 10*3/uL (ref 0.0–0.7)
Eosinophils Relative: 1.6 % (ref 0.0–5.0)
HCT: 36.6 % — ABNORMAL LOW (ref 39.0–52.0)
HEMOGLOBIN: 12.6 g/dL — AB (ref 13.0–17.0)
LYMPHS ABS: 1.7 10*3/uL (ref 0.7–4.0)
Lymphocytes Relative: 49.6 % — ABNORMAL HIGH (ref 12.0–46.0)
MCHC: 34.5 g/dL (ref 30.0–36.0)
MCV: 84.4 fl (ref 78.0–100.0)
MONO ABS: 0.4 10*3/uL (ref 0.1–1.0)
Monocytes Relative: 11.5 % (ref 3.0–12.0)
Neutro Abs: 1.2 10*3/uL — ABNORMAL LOW (ref 1.4–7.7)
Neutrophils Relative %: 36.7 % — ABNORMAL LOW (ref 43.0–77.0)
Platelets: 181 10*3/uL (ref 150.0–400.0)
RBC: 4.33 Mil/uL (ref 4.22–5.81)
RDW: 13.1 % (ref 11.5–15.5)
WBC: 3.4 10*3/uL — ABNORMAL LOW (ref 4.0–10.5)

## 2014-07-15 LAB — BASIC METABOLIC PANEL
BUN: 15 mg/dL (ref 6–23)
CO2: 29 mEq/L (ref 19–32)
CREATININE: 1.13 mg/dL (ref 0.40–1.50)
Calcium: 9.3 mg/dL (ref 8.4–10.5)
Chloride: 109 mEq/L (ref 96–112)
GFR: 81.36 mL/min (ref >60.00)
Glucose, Bld: 92 mg/dL (ref 70–99)
POTASSIUM: 4.5 meq/L (ref 3.5–5.1)
SODIUM: 141 meq/L (ref 135–145)

## 2014-07-15 LAB — HEMOGLOBIN A1C: HEMOGLOBIN A1C: 6.2 % (ref 4.6–6.5)

## 2014-07-15 LAB — HEPATIC FUNCTION PANEL
ALBUMIN: 3.9 g/dL (ref 3.5–5.2)
ALK PHOS: 44 U/L (ref 39–117)
ALT: 13 U/L (ref 0–53)
AST: 22 U/L (ref 0–37)
Bilirubin, Direct: 0.1 mg/dL (ref 0.0–0.3)
Total Bilirubin: 0.4 mg/dL (ref 0.2–1.2)
Total Protein: 6.4 g/dL (ref 6.0–8.3)

## 2014-07-15 LAB — MICROALBUMIN / CREATININE URINE RATIO
CREATININE, U: 126 mg/dL
MICROALB/CREAT RATIO: 1 mg/g (ref 0.0–30.0)
Microalb, Ur: 1.2 mg/dL (ref 0.0–1.9)

## 2014-07-15 LAB — TESTOSTERONE: TESTOSTERONE: 205.55 ng/dL — AB (ref 300.00–890.00)

## 2014-07-15 LAB — URINALYSIS, ROUTINE W REFLEX MICROSCOPIC
Bilirubin Urine: NEGATIVE
Hgb urine dipstick: NEGATIVE
Ketones, ur: NEGATIVE
Leukocytes, UA: NEGATIVE
Nitrite: NEGATIVE
SPECIFIC GRAVITY, URINE: 1.025 (ref 1.000–1.030)
Total Protein, Urine: NEGATIVE
Urine Glucose: NEGATIVE
Urobilinogen, UA: 2 — AB (ref 0.0–1.0)
pH: 6 (ref 5.0–8.0)

## 2014-07-15 LAB — TSH: TSH: 1.48 u[IU]/mL (ref 0.35–4.50)

## 2014-07-15 LAB — LIPID PANEL
CHOL/HDL RATIO: 4
CHOLESTEROL: 136 mg/dL (ref 0–200)
HDL: 31.7 mg/dL — ABNORMAL LOW (ref >39.00)
NonHDL: 104.3
Triglycerides: 393 mg/dL — ABNORMAL HIGH (ref 0.0–149.0)
VLDL: 78.6 mg/dL — ABNORMAL HIGH (ref 0.0–40.0)

## 2014-07-15 LAB — LDL CHOLESTEROL, DIRECT: Direct LDL: 79 mg/dL

## 2014-07-15 LAB — PSA: PSA: 1.6 ng/mL (ref 0.10–4.00)

## 2014-07-16 ENCOUNTER — Encounter: Payer: Self-pay | Admitting: Internal Medicine

## 2014-07-16 DIAGNOSIS — E291 Testicular hypofunction: Secondary | ICD-10-CM

## 2014-07-16 HISTORY — DX: Testicular hypofunction: E29.1

## 2014-07-18 ENCOUNTER — Ambulatory Visit (INDEPENDENT_AMBULATORY_CARE_PROVIDER_SITE_OTHER)
Admission: RE | Admit: 2014-07-18 | Discharge: 2014-07-18 | Disposition: A | Discharge status: Home / Self Care | Payer: Medicare PPO | Source: Ambulatory Visit | Attending: Internal Medicine | Admitting: Internal Medicine

## 2014-07-18 ENCOUNTER — Encounter: Payer: Self-pay | Admitting: Internal Medicine

## 2014-07-18 ENCOUNTER — Ambulatory Visit (INDEPENDENT_AMBULATORY_CARE_PROVIDER_SITE_OTHER): Payer: Medicare PPO | Admitting: Internal Medicine

## 2014-07-18 VITALS — BP 128/80 | HR 51 | Temp 97.8°F | Resp 16 | Ht 68.0 in | Wt 200.0 lb

## 2014-07-18 DIAGNOSIS — E119 Type 2 diabetes mellitus without complications: Secondary | ICD-10-CM

## 2014-07-18 DIAGNOSIS — M79644 Pain in right finger(s): Secondary | ICD-10-CM | POA: Diagnosis not present

## 2014-07-18 DIAGNOSIS — R079 Chest pain, unspecified: Secondary | ICD-10-CM

## 2014-07-18 DIAGNOSIS — J309 Allergic rhinitis, unspecified: Secondary | ICD-10-CM | POA: Diagnosis not present

## 2014-07-18 DIAGNOSIS — Z0000 Encounter for general adult medical examination without abnormal findings: Secondary | ICD-10-CM

## 2014-07-18 HISTORY — DX: Allergic rhinitis, unspecified: J30.9

## 2014-07-18 MED ORDER — DESLORATADINE 5 MG PO TABS: 5.0000 mg | ORAL_TABLET | Freq: Every day | ORAL | Status: DC

## 2014-07-18 MED ORDER — METFORMIN HCL 500 MG PO TABS: 500.0000 mg | ORAL_TABLET | Freq: Two times a day (BID) | ORAL | Status: DC

## 2014-07-18 MED ORDER — METHYLPREDNISOLONE ACETATE 80 MG/ML IJ SUSP
80.0000 mg | Freq: Once | INTRAMUSCULAR | Status: AC
  Administered 2014-07-18: 80 mg via INTRAMUSCULAR

## 2014-07-18 MED ORDER — FLUTICASONE PROPIONATE 50 MCG/ACT NA SUSP: 2.0000 | Freq: Every day | NASAL | Status: DC

## 2014-07-18 MED ORDER — DICLOFENAC SODIUM 1 % TD GEL: 2.0000 g | Freq: Four times a day (QID) | TRANSDERMAL | Status: DC

## 2014-07-18 NOTE — Assessment & Plan Note (Signed)

## 2014-07-18 NOTE — Progress Notes (Signed)
Subjective:    [name], male    DOB: [birth year], 74 y.o.   MRN: [id number]  HPI  Here for wellness and f/u;  Overall doing ok;  Pt denies , worsening SOB, DOE, wheezing, orthopnea, PND, worsening LE edema, palpitations, dizziness or syncope. Has had CP -vague intermittent for several wks, anterior diffuse chest, without radiation, no n/v, palps, diaphoresis or dizziness, nonpleuritic/nonpositional/nonexertional. .  Pt denies neurological change such as new headache, facial or extremity weakness.  Pt denies polydipsia, polyuria, or low sugar symptoms. Pt states overall good compliance with treatment and medications, good tolerability, and has been trying to follow appropriate diet.  Pt denies worsening depressive symptoms, suicidal ideation or panic. No fever, night sweats, wt loss, loss of appetite, or other constitutional symptoms.  Pt states good ability with ADL's, has low fall risk, home safety reviewed and adequate, no other significant changes in hearing or vision, and only occasionally active with exercise.  Denies worsening reflux, abd pain, dysphagia, n/v, or blood but still having recurrent irreg BM schedule, only 1-2 days for BM.  Also with pain to base of right thumb with bony enlargment, worse to use the thumb more, better to rest, no trauma, fever or gout.  Does have several wks ongoing nasal allergy symptoms with clearish congestion, itch and sneezing, without fever.   Past Medical History  Diagnosis Date  . Diabetes mellitus     Type II  . Hypertension   . BPH (benign prostatic hyperplasia)   . Carbuncle     recurrent MRSA carbuncles  . Arthritis   . Cataract   . Glaucoma   . Hyperlipidemia   . Disk prolapse   . Sinusitis 07/29/2012  . Male hypogonadism 07/16/2014  . Allergic rhinitis 07/18/2014   Past Surgical History  Procedure Laterality Date  . Cataract extraction      x 2  . Colonoscopy    . Polypectomy      reports that he quit smoking about 35  years ago. He has never used smokeless tobacco. He reports that he drinks about 2.5 oz of alcohol per week. He reports that he does not use illicit drugs. family history includes Cancer in his brother and mother; Diabetes in his sister; Hyperlipidemia in his brother; Hypertension in his brother, mother, and sister; Stroke in his brother. There is no history of Colon cancer, Heart attack, or Sudden death. No Known Allergies Current Outpatient Prescriptions on File Prior to Visit  Medication Sig Dispense Refill  . aspirin 81 MG tablet Take 81 mg by mouth daily.     Marland Kitchen glucose blood (ONE TOUCH TEST STRIPS) test strip 1 each by Other route as needed. Use as instructed to check blood sugar once daily (dx 250.00)    . lisinopril (PRINIVIL,ZESTRIL) 10 MG tablet Take 1 tablet (10 mg total) by mouth daily. 30 tablet 11  . ONE TOUCH LANCETS MISC by Does not apply route. Use to check blood sugar once daily (dx code 250.00)    . pravastatin (PRAVACHOL) 40 MG tablet Take 1 tablet (40 mg total) by mouth daily. 30 tablet 11  . terazosin (HYTRIN) 2 MG capsule TAKE 1 CAPSULE BY MOUTH AT BEDTIME. 30 capsule 5   No current facility-administered medications on file prior to visit.   Review of Systems  Constitutional: Negative for increased diaphoresis, other activity, appetite or siginficant weight change other than noted HENT: Negative for worsening hearing loss, ear pain, facial swelling, mouth sores and neck  stiffness.   Eyes: Negative for other worsening pain, redness or visual disturbance.  Respiratory: Negative for shortness of breath and wheezing  Cardiovascular: Negative for chest pain and palpitations.  Gastrointestinal: Negative for diarrhea, blood in stool, abdominal distention or other pain Genitourinary: Negative for hematuria, flank pain or change in urine volume.  Musculoskeletal: Negative for myalgias or other joint complaints.  Skin: Negative for color change and wound or drainage.  Neurological:  Negative for syncope and numbness. other than noted Hematological: Negative for adenopathy. or other swelling Psychiatric/Behavioral: Negative for hallucinations, SI, self-injury, decreased concentration or other worsening agitation.   Objective:   Physical Exam BP 128/80 mmHg  Pulse 51  Temp(Src) 97.8 F (36.6 C) (Oral)  Resp 16  Ht 5\' 8"  (1.727 m)  Wt 200 lb (90.719 kg)  BMI 30.42 kg/m2  SpO2 97% VS noted,  Constitutional: Pt is oriented to person, place, and time. Appears well-developed and well-nourished, in no significant distress Head: Normocephalic and atraumatic.  Right Ear: External ear normal.  Left Ear: External ear normal.  Nose: Nose normal.  Mouth/Throat: Oropharynx is clear and moist.  Bilat tm's with mild erythema.  Max sinus areas tender.  Pharynx with mild erythema, no exudate Eyes: Conjunctivae and EOM are normal. Pupils are equal, round, and reactive to light.  Neck: Normal range of motion. Neck supple. No JVD present. No tracheal deviation present or significant neck LA or mass Cardiovascular: Normal rate, regular rhythm, normal heart sounds and intact distal pulses.   Pulmonary/Chest: Effort normal and breath sounds without rales or wheezing  Abdominal: Soft. Bowel sounds are normal. NT. No HSM  Musculoskeletal: Normal range of motion. Exhibits no edema.  Lymphadenopathy:  Has no cervical adenopathy.  Neurological: Pt is alert and oriented to person, place, and time. Pt has normal reflexes. No cranial nerve deficit. Motor grossly intact Skin: Skin is warm and dry. No rash noted.  Psychiatric:  Has normal mood and affect. Behavior is normal.  Right thumb with bony change to [facility]  Lab Results  Component Value Date   WBC 3.4* 07/15/2014   HGB 12.6* 07/15/2014   HCT 36.6* 07/15/2014   PLT 181.0 07/15/2014   GLUCOSE 92 07/15/2014   CHOL 136 07/15/2014   TRIG 393.0* 07/15/2014   HDL 31.70* 07/15/2014   LDLDIRECT 79.0 07/15/2014   LDLCALC 120* 01/15/2014    ALT 13 07/15/2014   AST 22 07/15/2014   NA 141 07/15/2014   K 4.5 07/15/2014   CL 109 07/15/2014   CREATININE 1.13 07/15/2014   BUN 15 07/15/2014   CO2 29 07/15/2014   TSH 1.48 07/15/2014   PSA 1.60 07/15/2014   HGBA1C 6.2 07/15/2014   MICROALBUR 1.2 07/15/2014       Assessment & Plan:

## 2014-07-18 NOTE — Patient Instructions (Signed)
Your EKG was OK today  Please take all new medication as prescribed - the voltaren gel for the thumb arthritis, as well as the zyrtec and flonase for the allergies  You can also take Delsym OTC for cough, and/or Mucinex (or it's generic off brand) for congestion, and tylenol as needed for pain.  Please continue all other medications as before, and refills have been done if requested.  Please have the pharmacy call with any other refills you may need.  Please continue your efforts at being more active, low cholesterol diet, and weight control.  You are otherwise up to date with prevention measures today.  Please keep your appointments with your specialists as you may have planned  You will be contacted regarding the referral for: stress test  Please go to the XRAY Department in the Basement (go straight as you get off the elevator) for the x-ray testing  Please return in 6 months, or sooner if needed, with Lab testing done 3-5 days before

## 2014-07-20 NOTE — Assessment & Plan Note (Addendum)
Atypical, etiology unclear, for cardiac stress test, also for cxr, ECG reviewed as per emr

## 2014-07-20 NOTE — Assessment & Plan Note (Signed)
With seasonal flare, for depomedrol IM, then clarinex/flonase re-start asd

## 2014-07-20 NOTE — Assessment & Plan Note (Signed)
C/w DJD, for voltaren gel prn asd,  to f/u any worsening symptoms or concerns

## 2014-07-20 NOTE — Assessment & Plan Note (Signed)
stable overall by history and exam, recent data reviewed with pt, and pt to continue medical treatment as before,  to f/u any worsening symptoms or concerns Lab Results  Component Value Date   HGBA1C 6.2 07/15/2014

## 2014-07-21 ENCOUNTER — Encounter: Payer: Self-pay | Admitting: Internal Medicine

## 2014-08-22 ENCOUNTER — Encounter: Payer: Self-pay | Admitting: Internal Medicine

## 2014-09-01 ENCOUNTER — Other Ambulatory Visit: Payer: Self-pay | Admitting: Internal Medicine

## 2014-09-04 ENCOUNTER — Telehealth (HOSPITAL_COMMUNITY): Payer: Self-pay | Admitting: *Deleted

## 2014-09-04 NOTE — Telephone Encounter (Signed)
Patient given detailed instructions per Myocardial Perfusion Study Information Sheet for test on 09/08/14 at  9:15 am. Patient verbalized understanding. Hubbard Robinson, RN

## 2014-09-08 ENCOUNTER — Ambulatory Visit (HOSPITAL_COMMUNITY): Payer: Medicare PPO | Attending: Cardiovascular Disease

## 2014-09-08 DIAGNOSIS — R079 Chest pain, unspecified: Secondary | ICD-10-CM | POA: Diagnosis not present

## 2014-09-08 DIAGNOSIS — I517 Cardiomegaly: Secondary | ICD-10-CM | POA: Insufficient documentation

## 2014-09-08 LAB — MYOCARDIAL PERFUSION IMAGING
Estimated workload: 1 METS
LV dias vol: 129 mL
LV sys vol: 65 mL
Nuc Stress EF: 50 %
Peak HR: 77 {beats}/min
Percent of predicted max HR: 53 %
RATE: 0.32
Rest HR: 51 {beats}/min
SDS: 3
SRS: 1
SSS: 4
Stage 1 DBP: 83 mmHg
Stage 1 Grade: 0 %
Stage 1 HR: 51 {beats}/min
Stage 1 SBP: 116 mmHg
Stage 1 Speed: 0 mph
Stage 2 Grade: 0 %
Stage 2 HR: 51 {beats}/min
Stage 2 Speed: 0 mph
Stage 3 DBP: 72 mmHg
Stage 3 Grade: 0 %
Stage 3 HR: 51 {beats}/min
Stage 3 SBP: 113 mmHg
Stage 3 Speed: 0 mph
Stage 4 Grade: 0 %
Stage 4 HR: 77 {beats}/min
Stage 4 Speed: 0 mph
Stage 5 DBP: 49 mmHg
Stage 5 Grade: 0 %
Stage 5 HR: 72 {beats}/min
Stage 5 SBP: 83 mmHg
Stage 5 Speed: 0 mph
Stage 6 DBP: 66 mmHg
Stage 6 Grade: 0 %
Stage 6 HR: 64 {beats}/min
Stage 6 SBP: 97 mmHg
Stage 6 Speed: 0 mph
TID: 1.08

## 2014-09-08 MED ORDER — TECHNETIUM TC 99M SESTAMIBI GENERIC - CARDIOLITE
11.0000 | Freq: Once | INTRAVENOUS | Status: AC | PRN
  Administered 2014-09-08: 11 via INTRAVENOUS

## 2014-09-08 MED ORDER — REGADENOSON 0.4 MG/5ML IV SOLN
0.4000 mg | Freq: Once | INTRAVENOUS | Status: AC
  Administered 2014-09-08: 0.4 mg via INTRAVENOUS

## 2014-09-08 MED ORDER — TECHNETIUM TC 99M SESTAMIBI GENERIC - CARDIOLITE
33.0000 | Freq: Once | INTRAVENOUS | Status: AC | PRN
  Administered 2014-09-08: 33 via INTRAVENOUS

## 2014-09-09 ENCOUNTER — Encounter: Payer: Self-pay | Admitting: Internal Medicine

## 2014-09-25 ENCOUNTER — Telehealth: Payer: Self-pay | Admitting: Internal Medicine

## 2014-09-25 NOTE — Telephone Encounter (Signed)
[facility]  Patient Name: Jonathan Lewis  DOB: [birth year]    Initial Comment Caller states they had a tick bite three weeks ago and had some questions about it.   Nurse Assessment  Nurse: Justine Null, RN, Rodena Piety Date/Time (Eastern Time): 09/25/2014 5:04:36 PM  Confirm and document reason for call. If symptomatic, describe symptoms. ---Caller states they had a tick bite three weeks ago and had some questions about it. caller stated that he has been bitten on the right thigh area near the hip and has no rash and has been having no rash and has been dark in the area where the tick was and has no headache  Has the patient traveled out of the country within the last 30 days? ---No  Does the patient require triage? ---Yes  Related visit to physician within the last 2 weeks? ---No  Does the PT have any chronic conditions? (i.e. diabetes, asthma, etc.) ---Yes  List chronic conditions. ---diabetes type II HTN     Guidelines    Guideline Title Affirmed Question Affirmed Notes  Tick Bite [1] Scab is present AND [2] it drains pus or increases in size (all triage questions negative)    Final Disposition Thermal, RN, Rodena Piety

## 2014-09-26 NOTE — Telephone Encounter (Signed)
Called pt back. Lm with wife for pt to return my call.    Re: offer pt an OV with any provider if the area red, sore, pus filled or hot to the touch.

## 2014-09-26 NOTE — Telephone Encounter (Signed)
Pt called back. Appt has been made for Saturday clinic.

## 2014-09-27 ENCOUNTER — Encounter: Payer: Self-pay | Admitting: Internal Medicine

## 2014-09-27 ENCOUNTER — Ambulatory Visit (INDEPENDENT_AMBULATORY_CARE_PROVIDER_SITE_OTHER): Payer: Medicare PPO | Admitting: Internal Medicine

## 2014-09-27 VITALS — BP 120/72 | Temp 98.6°F | Ht 68.0 in | Wt 197.0 lb

## 2014-09-27 DIAGNOSIS — S70361A Insect bite (nonvenomous), right thigh, initial encounter: Secondary | ICD-10-CM

## 2014-09-27 DIAGNOSIS — W57XXXA Bitten or stung by nonvenomous insect and other nonvenomous arthropods, initial encounter: Secondary | ICD-10-CM | POA: Diagnosis not present

## 2014-09-27 NOTE — Progress Notes (Signed)
Chief Complaint  Patient presents with  . Tick Removal    per pt on right thigh area; seen about 3 weeks ago     HPI: Patient comes in today for [facility] Saturday clinic for  new problem evaluation. Here with wife  Had tick bite about 3 weeks ago  Was attacked for about a week thought it was a mole and then removed.  No pain fever rash  There is a bump at the area left . Has ? What to do no fever other systemic sx .   ROS: See pertinent positives and negatives per HPI.  Past Medical History  Diagnosis Date  . Diabetes mellitus     Type II  . Hypertension   . BPH (benign prostatic hyperplasia)   . Carbuncle     recurrent MRSA carbuncles  . Arthritis   . Cataract   . Glaucoma   . Hyperlipidemia   . Disk prolapse   . Sinusitis 07/29/2012  . Male hypogonadism 07/16/2014  . Allergic rhinitis 07/18/2014    Family History  Problem Relation Age of Onset  . Cancer Mother   . Hypertension Mother   . Colon cancer Neg Hx   . Heart attack Neg Hx   . Sudden death Neg Hx   . Diabetes Sister   . Cancer Brother     colon  . Hypertension Brother   . Hyperlipidemia Brother   . Stroke Brother   . Hypertension Sister     History   Social History  . Marital Status: Married    Spouse Name: N/A  . Number of Children: 7  . Years of Education: 7   Occupational History  . pastor    Social History Main Topics  . Smoking status: Former Smoker    Quit date: 10/03/1978  . Smokeless tobacco: Never Used  . Alcohol Use: 2.5 oz/week    5 drink(s) per week     Comment: pint, occasion beer  . Drug Use: No  . Sexual Activity:    Partners: Female   Other Topics Concern  . None   Social History Narrative   9th grade.  Married '60. [name] 3 sons -'34 '64, '66; 4 dtrs -'60, '62, '61, '65; 15 grands, 25 great-grands.   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   ACD- discussed living will and HCPOA (July '14) provided packet.           Outpatient Prescriptions Prior to Visit    Medication Sig Dispense Refill  . aspirin 81 MG tablet Take 81 mg by mouth daily.     Marland Kitchen desloratadine (CLARINEX) 5 MG tablet Take 1 tablet (5 mg total) by mouth daily. 90 tablet 3  . diclofenac sodium (VOLTAREN) 1 % GEL Apply 2 g topically 4 (four) times daily. As needed for pain 100 g 5  . fluticasone (FLONASE) 50 MCG/ACT nasal spray Place 2 sprays into both nostrils daily. 16 g 2  . glucose blood (ONE TOUCH TEST STRIPS) test strip 1 each by Other route as needed. Use as instructed to check blood sugar once daily (dx 250.00)    . lisinopril (PRINIVIL,ZESTRIL) 10 MG tablet Take 1 tablet (10 mg total) by mouth daily. 30 tablet 11  . metFORMIN (GLUCOPHAGE) 500 MG tablet Take 1 tablet (500 mg total) by mouth 2 (two) times daily with a meal. 60 tablet 5  . ONE TOUCH LANCETS MISC by Does not apply route. Use to check blood sugar once daily (dx  code 250.00)    . pravastatin (PRAVACHOL) 40 MG tablet Take 1 tablet (40 mg total) by mouth daily. 30 tablet 11  . terazosin (HYTRIN) 2 MG capsule TAKE 1 CAPSULE BY MOUTH AT BEDTIME. 30 capsule 5   No facility-administered medications prior to visit.     EXAM:  BP 120/72 mmHg  Temp(Src) 98.6 F (37 C) (Oral)  Ht 5\' 8"  (1.727 m)  Wt 197 lb (89.359 kg)  BMI 29.96 kg/m2  Body mass index is 29.96 kg/(m^2).  GENERAL: vitals reviewed and listed above, alert, oriented, appears well hydrated and in no acute distress MS: moves all extremities without noticeable focal  Abnormality Right thigh 2 mm papule without redness or tenderness or fluctuance and no inguinal ung adenopathy . No acute rashes  PSYCH: pleasant and cooperative, no obvious depression or anxiety Has tick dead  Light intact  5 mm shape  ASSESSMENT AND PLAN:  Discussed the following assessment and plan:  Tick bite of thigh, right, initial encounter - small old local reaction no adenopathy ssytemic sx looks loke wook or  dog tick expectant managment   -Patient advised to return or notify  health care team  if symptoms worsen ,persist or new concerns arise.   Expectant management. Total visit 63mins > 50% spent counseling and coordinating care as indicated in above note and in instructions to patient .  Patient Instructions  No    Interventions at this time  Tick bite site is a local reaction that eventually will decrease .   Can use warm compresses if bogthers you. See  [place] if you develop a fever over 100.3 or other rashes . Otherwise  You should  Do well.  Check yourselves for ticks after working outside .   Tick Bite Information Ticks are insects that attach themselves to the skin and draw blood for food. There are various types of ticks. Common types include wood ticks and deer ticks. Most ticks live in shrubs and grassy areas. Ticks can climb onto your body when you make contact with leaves or grass where the tick is waiting. The most common places on the body for ticks to attach themselves are the scalp, neck, armpits, waist, and groin. Most tick bites are harmless, but sometimes ticks carry germs that cause diseases. These germs can be spread to a person during the tick's feeding process. The chance of a disease spreading through a tick bite depends on:   The type of tick.  Time of year.   How long the tick is attached.   Geographic location.  HOW CAN YOU PREVENT TICK BITES? Take these steps to help prevent tick bites when you are outdoors:  Wear protective clothing. Long sleeves and long pants are best.   Wear white clothes so you can see ticks more easily.  Tuck your pant legs into your socks.   If walking on a trail, stay in the middle of the trail to avoid brushing against bushes.  Avoid walking through areas with long grass.  Put insect repellent on all exposed skin and along boot tops, pant legs, and sleeve cuffs.   Check clothing, hair, and skin repeatedly and before going inside.   Brush off any ticks that are not attached.  Take a shower or  bath as soon as possible after being outdoors.  WHAT IS THE PROPER WAY TO REMOVE A TICK? Ticks should be removed as soon as possible to help prevent diseases caused by tick bites. 1. If latex gloves  are available, put them on before trying to remove a tick.  2. Using fine-point tweezers, grasp the tick as close to the skin as possible. You may also use curved forceps or a tick removal tool. Grasp the tick as close to its head as possible. Avoid grasping the tick on its body. 3. Pull gently with steady upward pressure until the tick lets go. Do not twist the tick or jerk it suddenly. This may break off the tick's head or mouth parts. 4. Do not squeeze or crush the tick's body. This could force disease-carrying fluids from the tick into your body.  5. After the tick is removed, wash the bite area and your hands with soap and water or other disinfectant such as alcohol. 6. Apply a small amount of antiseptic cream or ointment to the bite site.  7. Wash and disinfect any instruments that were used.  Do not try to remove a tick by applying a hot match, petroleum jelly, or fingernail polish to the tick. These methods do not work and may increase the chances of disease being spread from the tick bite.  WHEN SHOULD YOU SEEK MEDICAL CARE? Contact your health care provider if you are unable to remove a tick from your skin or if a part of the tick breaks off and is stuck in the skin.  After a tick bite, you need to be aware of signs and symptoms that could be related to diseases spread by ticks. Contact your health care provider if you develop any of the following in the days or weeks after the tick bite:  Unexplained fever.  Rash. A circular rash that appears days or weeks after the tick bite may indicate the possibility of Lyme disease. The rash may resemble a target with a bull's-eye and may occur at a different part of your body than the tick bite.  Redness and swelling in the area of the tick bite.    Tender, swollen lymph glands.   Diarrhea.   Weight loss.   Cough.   Fatigue.   Muscle, joint, or bone pain.   Abdominal pain.   Headache.   Lethargy or a change in your level of consciousness.  Difficulty walking or moving your legs.   Numbness in the legs.   Paralysis.  Shortness of breath.   Confusion.   Repeated vomiting.  Document Released: 03/18/2000 Document Revised: 01/09/2013 Document Reviewed: 08/29/2012 [facility] Patient Information 2015 [place], [place]. This information is not intended to replace advice given to you by your health care provider. Make sure you discuss any questions you have with your health care provider.      Standley Brooking. Kahron Kauth M.D.  Pre visit review using our clinic review tool, if applicable. No additional management support is needed unless otherwise documented below in the visit note.

## 2014-09-27 NOTE — Patient Instructions (Signed)
No    Interventions at this time  Tick bite site is a local reaction that eventually will decrease .   Can use warm compresses if bogthers you. See  [place] if you develop a fever over 100.3 or other rashes . Otherwise  You should  Do well.  Check yourselves for ticks after working outside .   Tick Bite Information Ticks are insects that attach themselves to the skin and draw blood for food. There are various types of ticks. Common types include wood ticks and deer ticks. Most ticks live in shrubs and grassy areas. Ticks can climb onto your body when you make contact with leaves or grass where the tick is waiting. The most common places on the body for ticks to attach themselves are the scalp, neck, armpits, waist, and groin. Most tick bites are harmless, but sometimes ticks carry germs that cause diseases. These germs can be spread to a person during the tick's feeding process. The chance of a disease spreading through a tick bite depends on:   The type of tick.  Time of year.   How long the tick is attached.   Geographic location.  HOW CAN YOU PREVENT TICK BITES? Take these steps to help prevent tick bites when you are outdoors:  Wear protective clothing. Long sleeves and long pants are best.   Wear white clothes so you can see ticks more easily.  Tuck your pant legs into your socks.   If walking on a trail, stay in the middle of the trail to avoid brushing against bushes.  Avoid walking through areas with long grass.  Put insect repellent on all exposed skin and along boot tops, pant legs, and sleeve cuffs.   Check clothing, hair, and skin repeatedly and before going inside.   Brush off any ticks that are not attached.  Take a shower or bath as soon as possible after being outdoors.  WHAT IS THE PROPER WAY TO REMOVE A TICK? Ticks should be removed as soon as possible to help prevent diseases caused by tick bites. 1. If latex gloves are available, put them on before  trying to remove a tick.  2. Using fine-point tweezers, grasp the tick as close to the skin as possible. You may also use curved forceps or a tick removal tool. Grasp the tick as close to its head as possible. Avoid grasping the tick on its body. 3. Pull gently with steady upward pressure until the tick lets go. Do not twist the tick or jerk it suddenly. This may break off the tick's head or mouth parts. 4. Do not squeeze or crush the tick's body. This could force disease-carrying fluids from the tick into your body.  5. After the tick is removed, wash the bite area and your hands with soap and water or other disinfectant such as alcohol. 6. Apply a small amount of antiseptic cream or ointment to the bite site.  7. Wash and disinfect any instruments that were used.  Do not try to remove a tick by applying a hot match, petroleum jelly, or fingernail polish to the tick. These methods do not work and may increase the chances of disease being spread from the tick bite.  WHEN SHOULD YOU SEEK MEDICAL CARE? Contact your health care provider if you are unable to remove a tick from your skin or if a part of the tick breaks off and is stuck in the skin.  After a tick bite, you need to be aware  of signs and symptoms that could be related to diseases spread by ticks. Contact your health care provider if you develop any of the following in the days or weeks after the tick bite:  Unexplained fever.  Rash. A circular rash that appears days or weeks after the tick bite may indicate the possibility of Lyme disease. The rash may resemble a target with a bull's-eye and may occur at a different part of your body than the tick bite.  Redness and swelling in the area of the tick bite.   Tender, swollen lymph glands.   Diarrhea.   Weight loss.   Cough.   Fatigue.   Muscle, joint, or bone pain.   Abdominal pain.   Headache.   Lethargy or a change in your level of consciousness.  Difficulty  walking or moving your legs.   Numbness in the legs.   Paralysis.  Shortness of breath.   Confusion.   Repeated vomiting.  Document Released: 03/18/2000 Document Revised: 01/09/2013 Document Reviewed: 08/29/2012 [facility] Patient Information 2015 [place], [place]. This information is not intended to replace advice given to you by your health care provider. Make sure you discuss any questions you have with your health care provider.

## 2014-11-06 ENCOUNTER — Other Ambulatory Visit: Payer: Self-pay | Admitting: Internal Medicine

## 2015-01-21 ENCOUNTER — Other Ambulatory Visit (INDEPENDENT_AMBULATORY_CARE_PROVIDER_SITE_OTHER): Payer: Medicare PPO

## 2015-01-21 DIAGNOSIS — E119 Type 2 diabetes mellitus without complications: Secondary | ICD-10-CM

## 2015-01-21 LAB — HEPATIC FUNCTION PANEL
ALT: 16 U/L (ref 0–53)
AST: 22 U/L (ref 0–37)
Albumin: 3.6 g/dL (ref 3.5–5.2)
Alkaline Phosphatase: 35 U/L — ABNORMAL LOW (ref 39–117)
BILIRUBIN TOTAL: 0.4 mg/dL (ref 0.2–1.2)
Bilirubin, Direct: 0.1 mg/dL (ref 0.0–0.3)
TOTAL PROTEIN: 6.4 g/dL (ref 6.0–8.3)

## 2015-01-21 LAB — HEMOGLOBIN A1C: HEMOGLOBIN A1C: 6.1 % (ref 4.6–6.5)

## 2015-01-21 LAB — LIPID PANEL
Cholesterol: 135 mg/dL (ref 0–200)
HDL: 37 mg/dL — ABNORMAL LOW (ref >39.00)
LDL Cholesterol: 80 mg/dL (ref 0–99)
NonHDL: 97.95
TRIGLYCERIDES: 90 mg/dL (ref 0.0–149.0)
Total CHOL/HDL Ratio: 4
VLDL: 18 mg/dL (ref 0.0–40.0)

## 2015-01-21 LAB — BASIC METABOLIC PANEL
BUN: 17 mg/dL (ref 6–23)
CO2: 29 mEq/L (ref 19–32)
Calcium: 9 mg/dL (ref 8.4–10.5)
Chloride: 107 mEq/L (ref 96–112)
Creatinine, Ser: 1.05 mg/dL (ref 0.40–1.50)
GFR: 88.43 mL/min (ref >60.00)
GLUCOSE: 118 mg/dL — AB (ref 70–99)
Potassium: 3.8 mEq/L (ref 3.5–5.1)
Sodium: 142 mEq/L (ref 135–145)

## 2015-01-22 ENCOUNTER — Encounter: Payer: Self-pay | Admitting: Internal Medicine

## 2015-01-22 ENCOUNTER — Ambulatory Visit (INDEPENDENT_AMBULATORY_CARE_PROVIDER_SITE_OTHER): Payer: Medicare PPO | Admitting: Internal Medicine

## 2015-01-22 VITALS — BP 130/66 | HR 50 | Temp 97.6°F | Wt 201.5 lb

## 2015-01-22 DIAGNOSIS — E119 Type 2 diabetes mellitus without complications: Secondary | ICD-10-CM

## 2015-01-22 DIAGNOSIS — Z23 Encounter for immunization: Secondary | ICD-10-CM

## 2015-01-22 DIAGNOSIS — E785 Hyperlipidemia, unspecified: Secondary | ICD-10-CM

## 2015-01-22 DIAGNOSIS — Z0000 Encounter for general adult medical examination without abnormal findings: Secondary | ICD-10-CM

## 2015-01-22 DIAGNOSIS — I10 Essential (primary) hypertension: Secondary | ICD-10-CM

## 2015-01-22 NOTE — Progress Notes (Signed)
Subjective:    [name], male    DOB: 06-Jul-1940, 74 y.o.   MRN: [id number]  HPI  Here to f/u; overall doing ok,  Pt denies chest pain, increasing sob or doe, wheezing, orthopnea, PND, increased LE swelling, palpitations, dizziness or syncope.  Pt denies new neurological symptoms such as new headache, or facial or extremity weakness or numbness.  Pt denies polydipsia, polyuria, or low sugar episode.   Pt denies new neurological symptoms such as new headache, or facial or extremity weakness or numbness.   Pt states overall good compliance with meds, mostly trying to follow appropriate diet, with wt overall stable,  but little exercise however. Wt Readings from Last 3 Encounters:  01/22/15 201 lb 8 oz (91.4 kg)  09/27/14 197 lb (89.359 kg)  09/08/14 200 lb (90.719 kg)  No complaints Past Medical History  Diagnosis Date  . Diabetes mellitus     Type II  . Hypertension   . BPH (benign prostatic hyperplasia)   . Carbuncle     recurrent MRSA carbuncles  . Arthritis   . Cataract   . Glaucoma   . Hyperlipidemia   . Disk prolapse   . Sinusitis 07/29/2012  . Male hypogonadism 07/16/2014  . Allergic rhinitis 07/18/2014   Past Surgical History  Procedure Laterality Date  . Cataract extraction      x 2  . Colonoscopy    . Polypectomy      reports that he quit smoking about 36 years ago. He has never used smokeless tobacco. He reports that he drinks about 2.5 oz of alcohol per week. He reports that he does not use illicit drugs. family history includes Cancer in his brother and mother; Diabetes in his sister; Hyperlipidemia in his brother; Hypertension in his brother, mother, and sister; Stroke in his brother. There is no history of Colon cancer, Heart attack, or Sudden death. No Known Allergies Current Outpatient Prescriptions on File Prior to Visit  Medication Sig Dispense Refill  . aspirin 81 MG tablet Take 81 mg by mouth daily.     Marland Kitchen desloratadine (CLARINEX) 5 MG  tablet Take 1 tablet (5 mg total) by mouth daily. (Patient not taking: Reported on 01/22/2015) 90 tablet 3  . diclofenac sodium (VOLTAREN) 1 % GEL Apply 2 g topically 4 (four) times daily. As needed for pain (Patient not taking: Reported on 01/22/2015) 100 g 5  . fluticasone (FLONASE) 50 MCG/ACT nasal spray Place 2 sprays into both nostrils daily. 16 g 2  . glucose blood (ONE TOUCH TEST STRIPS) test strip 1 each by Other route as needed. Use as instructed to check blood sugar once daily (dx 250.00)    . lisinopril (PRINIVIL,ZESTRIL) 10 MG tablet TAKE 1 TABLET BY MOUTH EVERY DAY 30 tablet 5  . metFORMIN (GLUCOPHAGE) 500 MG tablet Take 1 tablet (500 mg total) by mouth 2 (two) times daily with a meal. 60 tablet 5  . ONE TOUCH LANCETS MISC by Does not apply route. Use to check blood sugar once daily (dx code 250.00)    . pravastatin (PRAVACHOL) 40 MG tablet Take 1 tablet (40 mg total) by mouth daily. 30 tablet 11  . terazosin (HYTRIN) 2 MG capsule TAKE 1 CAPSULE BY MOUTH AT BEDTIME. 30 capsule 5   No current facility-administered medications on file prior to visit.   Review of Systems  Constitutional: Negative for unusual diaphoresis or night sweats HENT: Negative for ringing in ear or discharge Eyes: Negative for double  vision or worsening visual disturbance.  Respiratory: Negative for choking and stridor.   Gastrointestinal: Negative for vomiting or other signifcant bowel change Genitourinary: Negative for hematuria or change in urine volume.  Musculoskeletal: Negative for other MSK pain or swelling Skin: Negative for color change and worsening wound.  Neurological: Negative for tremors and numbness other than noted  Psychiatric/Behavioral: Negative for decreased concentration or agitation other than above       Objective:   Physical Exam BP 130/66 mmHg  Pulse 50  Temp(Src) 97.6 F (36.4 C)  Wt 201 lb 8 oz (91.4 kg)  SpO2 98% VS noted,  Constitutional: Pt appears in no significant  distress HENT: Head: NCAT.  Right Ear: External ear normal.  Left Ear: External ear normal.  Eyes: . Pupils are equal, round, and reactive to light. Conjunctivae and EOM are normal Neck: Normal range of motion. Neck supple.  Cardiovascular: Normal rate and regular rhythm.   Pulmonary/Chest: Effort normal and breath sounds without rales or wheezing.  Abd:  Soft, NT, ND, + BS Neurological: Pt is alert. Not confused , motor grossly intact Skin: Skin is warm. No rash, no LE edema Psychiatric: Pt behavior is normal. No agitation.      Assessment & Plan:

## 2015-01-22 NOTE — Patient Instructions (Signed)
You had the flu shot today  Please continue all other medications as before, and refills have been done if requested.  Please have the pharmacy call with any other refills you may need.  Please continue your efforts at being more active, low cholesterol diet, and weight control.  Please keep your appointments with your specialists as you may have planned  Please return in 6 months, or sooner if needed, with Lab testing done 3-5 days before  

## 2015-01-22 NOTE — Progress Notes (Signed)
Pre visit review using our clinic review tool, if applicable. No additional management support is needed unless otherwise documented below in the visit note. 

## 2015-01-24 NOTE — Assessment & Plan Note (Signed)
stable overall by history and exam, recent data reviewed with pt, and pt to continue medical treatment as before,  to f/u any worsening symptoms or concerns Lab Results  Component Value Date   HGBA1C 6.1 01/21/2015

## 2015-01-24 NOTE — Assessment & Plan Note (Signed)
stable overall by history and exam, recent data reviewed with pt, and pt to continue medical treatment as before,  to f/u any worsening symptoms or concerns BP Readings from Last 3 Encounters:  01/22/15 130/66  09/27/14 120/72  07/18/14 128/80

## 2015-01-24 NOTE — Assessment & Plan Note (Signed)
stable overall by history and exam, recent data reviewed with pt, and pt to continue medical treatment as before,  to f/u any worsening symptoms or concerns Lab Results  Component Value Date   [place] 80 01/21/2015

## 2015-02-10 ENCOUNTER — Other Ambulatory Visit: Payer: Self-pay

## 2015-02-10 MED ORDER — TERAZOSIN HCL 2 MG PO CAPS: 2.0000 mg | ORAL_CAPSULE | Freq: Once | ORAL | Status: DC

## 2015-03-11 ENCOUNTER — Emergency Department (HOSPITAL_COMMUNITY)
Admission: EM | Admit: 2015-03-11 | Discharge: 2015-03-12 | Disposition: A | Discharge status: Home / Self Care | Payer: Medicare PPO | Attending: Emergency Medicine | Admitting: Emergency Medicine

## 2015-03-11 ENCOUNTER — Encounter (HOSPITAL_COMMUNITY): Payer: Self-pay | Admitting: Emergency Medicine

## 2015-03-11 DIAGNOSIS — Z8614 Personal history of Methicillin resistant Staphylococcus aureus infection: Secondary | ICD-10-CM | POA: Diagnosis not present

## 2015-03-11 DIAGNOSIS — Y9389 Activity, other specified: Secondary | ICD-10-CM | POA: Insufficient documentation

## 2015-03-11 DIAGNOSIS — Z8669 Personal history of other diseases of the nervous system and sense organs: Secondary | ICD-10-CM | POA: Diagnosis not present

## 2015-03-11 DIAGNOSIS — E785 Hyperlipidemia, unspecified: Secondary | ICD-10-CM | POA: Diagnosis not present

## 2015-03-11 DIAGNOSIS — S0992XA Unspecified injury of nose, initial encounter: Secondary | ICD-10-CM | POA: Insufficient documentation

## 2015-03-11 DIAGNOSIS — E119 Type 2 diabetes mellitus without complications: Secondary | ICD-10-CM | POA: Insufficient documentation

## 2015-03-11 DIAGNOSIS — Y998 Other external cause status: Secondary | ICD-10-CM | POA: Diagnosis not present

## 2015-03-11 DIAGNOSIS — R040 Epistaxis: Secondary | ICD-10-CM

## 2015-03-11 DIAGNOSIS — Z7982 Long term (current) use of aspirin: Secondary | ICD-10-CM | POA: Diagnosis not present

## 2015-03-11 DIAGNOSIS — Z87891 Personal history of nicotine dependence: Secondary | ICD-10-CM | POA: Insufficient documentation

## 2015-03-11 DIAGNOSIS — Z872 Personal history of diseases of the skin and subcutaneous tissue: Secondary | ICD-10-CM | POA: Insufficient documentation

## 2015-03-11 DIAGNOSIS — Z79899 Other long term (current) drug therapy: Secondary | ICD-10-CM | POA: Diagnosis not present

## 2015-03-11 DIAGNOSIS — I10 Essential (primary) hypertension: Secondary | ICD-10-CM | POA: Insufficient documentation

## 2015-03-11 DIAGNOSIS — N400 Enlarged prostate without lower urinary tract symptoms: Secondary | ICD-10-CM | POA: Insufficient documentation

## 2015-03-11 DIAGNOSIS — Z7951 Long term (current) use of inhaled steroids: Secondary | ICD-10-CM | POA: Insufficient documentation

## 2015-03-11 DIAGNOSIS — M1990 Unspecified osteoarthritis, unspecified site: Secondary | ICD-10-CM | POA: Insufficient documentation

## 2015-03-11 DIAGNOSIS — Y92009 Unspecified place in unspecified non-institutional (private) residence as the place of occurrence of the external cause: Secondary | ICD-10-CM | POA: Diagnosis not present

## 2015-03-11 DIAGNOSIS — W010XXA Fall on same level from slipping, tripping and stumbling without subsequent striking against object, initial encounter: Secondary | ICD-10-CM | POA: Insufficient documentation

## 2015-03-11 NOTE — ED Provider Notes (Signed)
CSN: [id number]     Arrival date & time 03/11/15  2132 History  By signing my name below, I, [name], attest that this documentation has been prepared under the direction and in the presence of Merryl Hacker, MD. Electronically Signed: [name], ED Scribe. 03/11/2015. 11:53 PM.  Chief Complaint  Patient presents with  . Fall  . Epistaxis   The history is provided by the patient. No language interpreter was used.   HPI Comments: Jonathan Lewis is a 74 y.o. Male with a hx of HTN and Type II DM who presents to the Emergency Department complaining of a fall onset 9 hours ago (2pm). Pt reports slipping and falling at home off a small ladder, resulting in a nose injury with continuous left nare bleeding that subsided at 9 PM. Bleeding is now controlled but pt feels like there is a clot in the left nare. Pt reports use of low dose aspirin, Lisinopril, and Metformin daily. Pt denies headache, nasal pain, LOC, hx of heart disease, or hx of kidney disease. Regarding fall, he reports that he just hit his face on the lateral. He denies any headache, vomiting. He denies any pain.  Past Medical History  Diagnosis Date  . Diabetes mellitus     Type II  . Hypertension   . BPH (benign prostatic hyperplasia)   . Carbuncle     recurrent MRSA carbuncles  . Arthritis   . Cataract   . Glaucoma   . Hyperlipidemia   . Disk prolapse   . Sinusitis 07/29/2012  . Male hypogonadism 07/16/2014  . Allergic rhinitis 07/18/2014   Past Surgical History  Procedure Laterality Date  . Cataract extraction      x 2  . Colonoscopy    . Polypectomy     Family History  Problem Relation Age of Onset  . Cancer Mother   . Hypertension Mother   . Colon cancer Neg Hx   . Heart attack Neg Hx   . Sudden death Neg Hx   . Diabetes Sister   . Cancer Brother     colon  . Hypertension Brother   . Hyperlipidemia Brother   . Stroke Brother   . Hypertension Sister    Social History  Substance Use Topics   . Smoking status: Former Smoker    Quit date: 10/03/1978  . Smokeless tobacco: Never Used  . Alcohol Use: 2.5 oz/week    5 drink(s) per week     Comment: pint, occasion beer    Review of Systems  HENT: Positive for nosebleeds.   Neurological: Negative for syncope and headaches.  All other systems reviewed and are negative.  Allergies  Review of patient's allergies indicates no known allergies.  Home Medications   Prior to Admission medications   Medication Sig Start Date End Date Taking? Authorizing Provider  aspirin 81 MG tablet Take 81 mg by mouth daily.     Historical Provider, MD  desloratadine (CLARINEX) 5 MG tablet Take 1 tablet (5 mg total) by mouth daily. Patient not taking: Reported on 01/22/2015 07/18/14   Biagio Borg, MD  diclofenac sodium (VOLTAREN) 1 % GEL Apply 2 g topically 4 (four) times daily. As needed for pain Patient not taking: Reported on 01/22/2015 07/18/14   Biagio Borg, MD  fluticasone [facility]) 50 MCG/ACT nasal spray Place 2 sprays into both nostrils daily. 07/18/14   Biagio Borg, MD  glucose blood (ONE TOUCH TEST STRIPS) test strip 1 each by Other  route as needed. Use as instructed to check blood sugar once daily (dx 250.00)    Historical Provider, MD  lisinopril (PRINIVIL,ZESTRIL) 10 MG tablet TAKE 1 TABLET BY MOUTH EVERY DAY 11/06/14   Biagio Borg, MD  metFORMIN (GLUCOPHAGE) 500 MG tablet Take 1 tablet (500 mg total) by mouth 2 (two) times daily with a meal. 07/18/14   Biagio Borg, MD  ONE TOUCH LANCETS MISC by Does not apply route. Use to check blood sugar once daily (dx code 250.00)    Historical Provider, MD  oxymetazoline (AFRIN) 0.05 % nasal spray Place 1 spray into both nostrils 2 (two) times daily as needed (nosebleed). 03/12/15   Merryl Hacker, MD  pravastatin (PRAVACHOL) 40 MG tablet Take 1 tablet (40 mg total) by mouth daily. 01/28/14   Biagio Borg, MD  terazosin (HYTRIN) 2 MG capsule Take 1 capsule (2 mg total) by mouth once. 02/10/15    Biagio Borg, MD   BP 145/91 mmHg  Pulse 71  Temp(Src) 98.5 F (36.9 C) (Oral)  Resp 22  SpO2 99% Physical Exam  Constitutional: He is oriented to person, place, and time. He appears well-developed and well-nourished.  HENT:  Head: Normocephalic and atraumatic.  No external signs of trauma, there is clot and dried blood noted in the left nare, no septal hematoma noted, mild swelling noted over the lateral mucosa of the left nare  Eyes: Pupils are equal, round, and reactive to light.  Cardiovascular: Normal rate and regular rhythm.   Pulmonary/Chest: Effort normal. No respiratory distress.  Neurological: He is alert and oriented to person, place, and time.  Skin: Skin is warm and dry.  Psychiatric: He has a normal mood and affect.  Nursing note and vitals reviewed.   ED Course  .Epistaxis Management Date/Time: 03/12/2015 1:48 AM Performed by: Merryl Hacker Authorized by: Merryl Hacker Consent: Verbal consent obtained. Consent given by: patient Patient sedated: no Treatment site: left anterior Repair method: silver nitrate Post-procedure assessment: bleeding stopped Treatment complexity: simple   (including critical care time) DIAGNOSTIC STUDIES: Oxygen Saturation is 99% on RA, normal by my interpretation.    COORDINATION OF CARE: 11:51 PM-Discussed treatment plan which includes Afrin with pt at bedside and pt agreed to plan.    Labs Review Labs Reviewed - No data to display  Imaging Review No results found. I have personally reviewed and evaluated these images and lab results as part of my medical decision-making.   EKG Interpretation None      MDM   Final diagnoses:  Epistaxis    Patient presents with traumatic epistaxis. No external signs of trauma. He has otherwise well-appearing. While the patient is 74 years old, mechanism of injury is minor and he is greater than 9 hours out from fall. He is neurologically intact and have low suspicion for  intracranial pathology. No indication at this time for CT imaging. Vital signs are reassuring. Initially bleeding had stopped. However, I do see clot in the nose. Clot was blown out and Afrin applied.  Friable tissue and is oozing noted from the left nasal septum.  Cauterized with silver nitrate. Bleeding controlled and I'll recheck no recurrence. Discussed with patient management at home.  ENT follow-up given if bleeding recurs.  After history, exam, and medical workup I feel the patient has been appropriately medically screened and is safe for discharge home. Pertinent diagnoses were discussed with the patient. Patient was given return precautions.  I personally performed the services  described in this documentation, which was scribed in my presence. The recorded information has been reviewed and is accurate.    Merryl Hacker, MD 03/12/15 [phone]

## 2015-03-11 NOTE — ED Notes (Signed)
Pt. slipped and fell at home this afternoon , reports injury to nose with left nare bleeding , no bleeding at arrival , respirations unlabored , denies LOC/ambulatory , pt. denies taking anticoagulant medication .

## 2015-03-12 MED ORDER — SILVER NITRATE-POT NITRATE 75-25 % EX MISC
1.0000 | Freq: Once | CUTANEOUS | Status: AC
  Administered 2015-03-12: 1 via TOPICAL
  Filled 2015-03-12: qty 1

## 2015-03-12 MED ORDER — OXYMETAZOLINE HCL 0.05 % NA SOLN: 1.0000 | Freq: Two times a day (BID) | NASAL | Status: DC | PRN

## 2015-03-12 MED ORDER — OXYMETAZOLINE HCL 0.05 % NA SOLN
1.0000 | Freq: Once | NASAL | Status: AC
  Administered 2015-03-12: 1 via NASAL
  Filled 2015-03-12: qty 15

## 2015-03-12 NOTE — ED Notes (Signed)
Per EDP instructed pt to blow clots out of nose before giving afrin, pt nose started bleeding again after blowing and then afrin was administered,

## 2015-03-12 NOTE — Discharge Instructions (Signed)

## 2015-03-14 ENCOUNTER — Ambulatory Visit (INDEPENDENT_AMBULATORY_CARE_PROVIDER_SITE_OTHER): Payer: Medicare PPO | Admitting: Family

## 2015-03-14 ENCOUNTER — Encounter: Payer: Self-pay | Admitting: Family

## 2015-03-14 VITALS — BP 126/80 | HR 62 | Temp 98.9°F | Wt 199.0 lb

## 2015-03-14 DIAGNOSIS — J069 Acute upper respiratory infection, unspecified: Secondary | ICD-10-CM | POA: Diagnosis not present

## 2015-03-14 MED ORDER — BENZONATATE 100 MG PO CAPS: 100.0000 mg | ORAL_CAPSULE | Freq: Three times a day (TID) | ORAL | Status: DC | PRN

## 2015-03-14 MED ORDER — AZITHROMYCIN 250 MG PO TABS: ORAL_TABLET | ORAL | Status: DC

## 2015-03-14 NOTE — Assessment & Plan Note (Signed)
Symptoms and exam consistent with acute upper respiratory infection most likely viral. Continue conservative treatment with over-the-counter medications as needed for symptom relief and supportive care. Start Tessalon as needed for cough and sleep. Written prescription for azithromycin provided if symptoms worsen in next 2-3 days with watchful waiting. Follow-up if symptoms worsen or fail to improve.

## 2015-03-14 NOTE — Progress Notes (Signed)
Subjective:    [name], male    DOB: [birth year], 74 y.o.   MRN: [id number]  Chief Complaint  Patient presents with  . cold    HPI:  Jonathan Lewis is a 74 y.o. male who  has a past medical history of Diabetes mellitus; Hypertension; BPH (benign prostatic hyperplasia); Carbuncle; Arthritis; Cataract; Glaucoma; Hyperlipidemia; Disk prolapse; Sinusitis (07/29/2012); Male hypogonadism (07/16/2014); and Allergic rhinitis (07/18/2014). and presents today for an acute office visit.  This is a new problem. Associated symptoms of productive cough and congestion has been going on for about 3 days. Severity of the cough is enough to disturb his sleep. Timing of the symptoms is worse at night. Course of the symptoms has stayed about the same. Modifying factors include Alka-seltzer and afrin which have helped very little with his symptoms. Denies fevers. Denies any recent antibiotic use.   No Known Allergies   Current Outpatient Prescriptions on File Prior to Visit  Medication Sig Dispense Refill  . aspirin 81 MG tablet Take 81 mg by mouth daily.     . fluticasone (FLONASE) 50 MCG/ACT nasal spray Place 2 sprays into both nostrils daily. 16 g 2  . glucose blood (ONE TOUCH TEST STRIPS) test strip 1 each by Other route as needed. Use as instructed to check blood sugar once daily (dx 250.00)    . lisinopril (PRINIVIL,ZESTRIL) 10 MG tablet TAKE 1 TABLET BY MOUTH EVERY DAY 30 tablet 5  . metFORMIN (GLUCOPHAGE) 500 MG tablet Take 1 tablet (500 mg total) by mouth 2 (two) times daily with a meal. 60 tablet 5  . ONE TOUCH LANCETS MISC by Does not apply route. Use to check blood sugar once daily (dx code 250.00)    . oxymetazoline (AFRIN) 0.05 % nasal spray Place 1 spray into both nostrils 2 (two) times daily as needed (nosebleed). 30 mL 0  . pravastatin (PRAVACHOL) 40 MG tablet Take 1 tablet (40 mg total) by mouth daily. 30 tablet 11  . terazosin (HYTRIN) 2 MG capsule Take 1 capsule (2 mg  total) by mouth once. 30 capsule 5  . desloratadine (CLARINEX) 5 MG tablet Take 1 tablet (5 mg total) by mouth daily. (Patient not taking: Reported on 01/22/2015) 90 tablet 3  . diclofenac sodium (VOLTAREN) 1 % GEL Apply 2 g topically 4 (four) times daily. As needed for pain (Patient not taking: Reported on 01/22/2015) 100 g 5   No current facility-administered medications on file prior to visit.    Review of Systems  Constitutional: Negative for fever and chills.  HENT: Positive for congestion. Negative for ear pain, sinus pressure and sore throat.   Respiratory: Positive for cough.   Neurological: Negative for headaches.      Objective:    BP 126/80 mmHg  Pulse 62  Temp(Src) 98.9 F (37.2 C)  Wt 199 lb (90.266 kg) Nursing note and vital signs reviewed.  Physical Exam  Constitutional: He is oriented to person, place, and time. He appears well-developed and well-nourished. No distress.  HENT:  Right Ear: Hearing, tympanic membrane, external ear and ear canal normal.  Left Ear: Hearing, tympanic membrane, external ear and ear canal normal.  Nose: Nose normal.  Mouth/Throat: Uvula is midline, oropharynx is clear and moist and mucous membranes are normal.  Cardiovascular: Normal rate, regular rhythm, normal heart sounds and intact distal pulses.   Pulmonary/Chest: Effort normal and breath sounds normal.  Neurological: He is alert and oriented to person, place, and time.  Skin: Skin is warm and dry.  Psychiatric: He has a normal mood and affect. His behavior is normal. Judgment and thought content normal.       Assessment & Plan:   Problem List Items Addressed This Visit      Respiratory   Acute upper respiratory infection - Primary    Symptoms and exam consistent with acute upper respiratory infection most likely viral. Continue conservative treatment with over-the-counter medications as needed for symptom relief and supportive care. Start Tessalon as needed for cough and  sleep. Written prescription for azithromycin provided if symptoms worsen in next 2-3 days with watchful waiting. Follow-up if symptoms worsen or fail to improve.      Relevant Medications   azithromycin (ZITHROMAX) 250 MG tablet   benzonatate (TESSALON PERLES) 100 MG capsule

## 2015-03-14 NOTE — Patient Instructions (Signed)
Thank you for choosing [facility].  Summary/Instructions:  Your prescription(s) have been submitted to your pharmacy or been printed and provided for you. Please take as directed and contact our office if you believe you are having problem(s) with the medication(s) or have any questions.  If your symptoms worsen or fail to improve, please contact our office for further instruction, or in case of emergency go directly to the emergency room at the closest medical facility.    General Recommendations:    Please drink plenty of fluids.  Get plenty of rest   Sleep in humidified air  Use saline nasal sprays  Netti pot   OTC Medications:  Decongestants - helps relieve congestion   Flonase (generic fluticasone) or Nasacort (generic triamcinolone) - please make sure to use the "cross-over" technique at a 45 degree angle towards the opposite eye as opposed to straight up the nasal passageway.   Sudafed (generic pseudoephedrine - Note this is the one that is available behind the pharmacy counter); Products with phenylephrine (-PE) may also be used but is often not as effective as pseudoephedrine.   If you have HIGH BLOOD PRESSURE - Coricidin HBP; AVOID any product that is -D as this contains pseudoephedrine which may increase your blood pressure.  Afrin (oxymetazoline) every 6-8 hours for up to 3 days.   Allergies - helps relieve runny nose, itchy eyes and sneezing   Claritin (generic loratidine), Allegra (fexofenidine), or Zyrtec (generic cyrterizine) for runny nose. These medications should not cause drowsiness.  Note - Benadryl (generic diphenhydramine) may be used however may cause drowsiness  Cough -   Delsym or Robitussin (generic dextromethorphan)  Expectorants - helps loosen mucus to ease removal   Mucinex (generic guaifenesin) as directed on the package.  Headaches / General Aches   Tylenol (generic acetaminophen) - DO NOT EXCEED 3 grams (3,000 mg) in a 24  hour time period  Advil/Motrin (generic ibuprofen)   Sore Throat -   Salt water gargle   Chloraseptic (generic benzocaine) spray or lozenges / Sucrets (generic dyclonine)    Upper Respiratory Infection, Adult Most upper respiratory infections (URIs) are a viral infection of the air passages leading to the lungs. A URI affects the nose, throat, and upper air passages. The most common type of URI is nasopharyngitis and is typically referred to as "the common cold." URIs run their course and usually go away on their own. Most of the time, a URI does not require medical attention, but sometimes a bacterial infection in the upper airways can follow a viral infection. This is called a secondary infection. Sinus and middle ear infections are common types of secondary upper respiratory infections. Bacterial pneumonia can also complicate a URI. A URI can worsen asthma and chronic obstructive pulmonary disease (COPD). Sometimes, these complications can require emergency medical care and may be life threatening.  CAUSES Almost all URIs are caused by viruses. A virus is a type of germ and can spread from one person to another.  RISKS FACTORS You may be at risk for a URI if:   You smoke.   You have chronic heart or lung disease.  You have a weakened defense (immune) system.   You are very young or very old.   You have nasal allergies or asthma.  You work in crowded or poorly ventilated areas.  You work in health care facilities or schools. SIGNS AND SYMPTOMS  Symptoms typically develop 2-3 days after you come in contact with a cold virus. Most   viral URIs last 7-10 days. However, viral URIs from the influenza virus (flu virus) can last 14-18 days and are typically more severe. Symptoms may include:   Runny or stuffy (congested) nose.   Sneezing.   Cough.   Sore throat.   Headache.   Fatigue.   Fever.   Loss of appetite.   Pain in your forehead, behind your eyes, and  over your cheekbones (sinus pain).  Muscle aches.  DIAGNOSIS  Your health care provider may diagnose a URI by:  Physical exam.  Tests to check that your symptoms are not due to another condition such as:  Strep throat.  Sinusitis.  Pneumonia.  Asthma. TREATMENT  A URI goes away on its own with time. It cannot be cured with medicines, but medicines may be prescribed or recommended to relieve symptoms. Medicines may help:  Reduce your fever.  Reduce your cough.  Relieve nasal congestion. HOME CARE INSTRUCTIONS   Take medicines only as directed by your health care provider.   Gargle warm saltwater or take cough drops to comfort your throat as directed by your health care provider.  Use a warm mist humidifier or inhale steam from a shower to increase air moisture. This may make it easier to breathe.  Drink enough fluid to keep your urine clear or pale yellow.   Eat soups and other clear broths and maintain good nutrition.   Rest as needed.   Return to work when your temperature has returned to normal or as your health care provider advises. You may need to stay home longer to avoid infecting others. You can also use a face mask and careful hand washing to prevent spread of the virus.  Increase the usage of your inhaler if you have asthma.   Do not use any tobacco products, including cigarettes, chewing tobacco, or electronic cigarettes. If you need help quitting, ask your health care provider. PREVENTION  The best way to protect yourself from getting a cold is to practice good hygiene.   Avoid oral or hand contact with people with cold symptoms.   Wash your hands often if contact occurs.  There is no clear evidence that vitamin C, vitamin E, echinacea, or exercise reduces the chance of developing a cold. However, it is always recommended to get plenty of rest, exercise, and practice good nutrition.  SEEK MEDICAL CARE IF:   You are getting worse rather than  better.   Your symptoms are not controlled by medicine.   You have chills.  You have worsening shortness of breath.  You have brown or red mucus.  You have yellow or brown nasal discharge.  You have pain in your face, especially when you bend forward.  You have a fever.  You have swollen neck glands.  You have pain while swallowing.  You have white areas in the back of your throat. SEEK IMMEDIATE MEDICAL CARE IF:   You have severe or persistent:  Headache.  Ear pain.  Sinus pain.  Chest pain.  You have chronic lung disease and any of the following:  Wheezing.  Prolonged cough.  Coughing up blood.  A change in your usual mucus.  You have a stiff neck.  You have changes in your:  Vision.  Hearing.  Thinking.  Mood. MAKE SURE YOU:   Understand these instructions.  Will watch your condition.  Will get help right away if you are not doing well or get worse.   This information is not intended to replace advice   given to you by your health care provider. Make sure you discuss any questions you have with your health care provider.   Document Released: 09/14/2000 Document Revised: 08/05/2014 Document Reviewed: 06/26/2013 Elsevier Interactive Patient Education 2016 [employer]vier Inc.  

## 2015-03-23 ENCOUNTER — Other Ambulatory Visit: Payer: Self-pay | Admitting: Internal Medicine

## 2015-05-05 ENCOUNTER — Observation Stay (HOSPITAL_COMMUNITY): Payer: Medicare Other

## 2015-05-05 ENCOUNTER — Encounter (HOSPITAL_COMMUNITY): Payer: Self-pay | Admitting: Cardiology

## 2015-05-05 ENCOUNTER — Emergency Department (HOSPITAL_COMMUNITY): Payer: Medicare Other

## 2015-05-05 ENCOUNTER — Telehealth: Payer: Self-pay | Admitting: Internal Medicine

## 2015-05-05 ENCOUNTER — Observation Stay (HOSPITAL_COMMUNITY)
Admission: EM | Admit: 2015-05-05 | Discharge: 2015-05-06 | Disposition: A | Discharge status: Home / Self Care | Payer: Medicare Other | Attending: Internal Medicine | Admitting: Internal Medicine

## 2015-05-05 DIAGNOSIS — Z7984 Long term (current) use of oral hypoglycemic drugs: Secondary | ICD-10-CM | POA: Diagnosis not present

## 2015-05-05 DIAGNOSIS — N400 Enlarged prostate without lower urinary tract symptoms: Secondary | ICD-10-CM | POA: Diagnosis not present

## 2015-05-05 DIAGNOSIS — E119 Type 2 diabetes mellitus without complications: Secondary | ICD-10-CM | POA: Insufficient documentation

## 2015-05-05 DIAGNOSIS — E1159 Type 2 diabetes mellitus with other circulatory complications: Secondary | ICD-10-CM | POA: Diagnosis present

## 2015-05-05 DIAGNOSIS — R110 Nausea: Secondary | ICD-10-CM | POA: Diagnosis not present

## 2015-05-05 DIAGNOSIS — K219 Gastro-esophageal reflux disease without esophagitis: Secondary | ICD-10-CM | POA: Insufficient documentation

## 2015-05-05 DIAGNOSIS — I10 Essential (primary) hypertension: Secondary | ICD-10-CM | POA: Insufficient documentation

## 2015-05-05 DIAGNOSIS — R55 Syncope and collapse: Secondary | ICD-10-CM | POA: Insufficient documentation

## 2015-05-05 DIAGNOSIS — I639 Cerebral infarction, unspecified: Secondary | ICD-10-CM

## 2015-05-05 DIAGNOSIS — Z87891 Personal history of nicotine dependence: Secondary | ICD-10-CM | POA: Diagnosis not present

## 2015-05-05 DIAGNOSIS — R42 Dizziness and giddiness: Principal | ICD-10-CM

## 2015-05-05 DIAGNOSIS — Z8614 Personal history of Methicillin resistant Staphylococcus aureus infection: Secondary | ICD-10-CM | POA: Diagnosis not present

## 2015-05-05 DIAGNOSIS — Z7982 Long term (current) use of aspirin: Secondary | ICD-10-CM | POA: Insufficient documentation

## 2015-05-05 DIAGNOSIS — I152 Hypertension secondary to endocrine disorders: Secondary | ICD-10-CM | POA: Diagnosis present

## 2015-05-05 DIAGNOSIS — R001 Bradycardia, unspecified: Secondary | ICD-10-CM | POA: Diagnosis not present

## 2015-05-05 DIAGNOSIS — E785 Hyperlipidemia, unspecified: Secondary | ICD-10-CM | POA: Insufficient documentation

## 2015-05-05 DIAGNOSIS — R7303 Prediabetes: Secondary | ICD-10-CM | POA: Diagnosis not present

## 2015-05-05 HISTORY — DX: Bradycardia, unspecified: R00.1

## 2015-05-05 LAB — BASIC METABOLIC PANEL
ANION GAP: 6 (ref 5–15)
BUN: 14 mg/dL (ref 6–20)
CALCIUM: 8.8 mg/dL — AB (ref 8.9–10.3)
CHLORIDE: 109 mmol/L (ref 101–111)
CO2: 27 mmol/L (ref 22–32)
Creatinine, Ser: 0.89 mg/dL (ref 0.61–1.24)
GFR calc Af Amer: >60 mL/min (ref >60)
GFR calc non Af Amer: >60 mL/min (ref >60)
GLUCOSE: 126 mg/dL — AB (ref 65–99)
POTASSIUM: 4.1 mmol/L (ref 3.5–5.1)
Sodium: 142 mmol/L (ref 135–145)

## 2015-05-05 LAB — URINALYSIS, ROUTINE W REFLEX MICROSCOPIC
BILIRUBIN URINE: NEGATIVE
GLUCOSE, UA: NEGATIVE mg/dL
HGB URINE DIPSTICK: NEGATIVE
Ketones, ur: NEGATIVE mg/dL
Leukocytes, UA: NEGATIVE
Nitrite: NEGATIVE
PH: 7 (ref 5.0–8.0)
Protein, ur: NEGATIVE mg/dL
SPECIFIC GRAVITY, URINE: 1.013 (ref 1.005–1.030)

## 2015-05-05 LAB — CBC
HEMATOCRIT: 35.1 % — AB (ref 39.0–52.0)
HEMOGLOBIN: 11.5 g/dL — AB (ref 13.0–17.0)
MCH: 27.9 pg (ref 26.0–34.0)
MCHC: 32.8 g/dL (ref 30.0–36.0)
MCV: 85.2 fL (ref 78.0–100.0)
Platelets: 151 10*3/uL (ref 150–400)
RBC: 4.12 MIL/uL — ABNORMAL LOW (ref 4.22–5.81)
RDW: 13 % (ref 11.5–15.5)
WBC: 2.3 10*3/uL — ABNORMAL LOW (ref 4.0–10.5)

## 2015-05-05 LAB — I-STAT TROPONIN, ED: Troponin i, poc: 0 ng/mL (ref 0.00–0.08)

## 2015-05-05 LAB — GLUCOSE, CAPILLARY: Glucose-Capillary: 100 mg/dL — ABNORMAL HIGH (ref 65–99)

## 2015-05-05 MED ORDER — ASPIRIN EC 81 MG PO TBEC
81.0000 mg | DELAYED_RELEASE_TABLET | Freq: Every day | ORAL | Status: DC
  Administered 2015-05-06: 81 mg via ORAL
  Filled 2015-05-05: qty 1

## 2015-05-05 MED ORDER — ONDANSETRON HCL 4 MG PO TABS: 4.0000 mg | ORAL_TABLET | Freq: Four times a day (QID) | ORAL | Status: DC | PRN

## 2015-05-05 MED ORDER — ENOXAPARIN SODIUM 40 MG/0.4ML ~~LOC~~ SOLN
40.0000 mg | SUBCUTANEOUS | Status: DC
  Administered 2015-05-05: 40 mg via SUBCUTANEOUS
  Filled 2015-05-05: qty 0.4

## 2015-05-05 MED ORDER — STROKE: EARLY STAGES OF RECOVERY BOOK
Freq: Once | Status: DC
  Filled 2015-05-05: qty 1

## 2015-05-05 MED ORDER — PRAVASTATIN SODIUM 40 MG PO TABS: 40.0000 mg | ORAL_TABLET | Freq: Every day | ORAL | Status: DC

## 2015-05-05 MED ORDER — INSULIN ASPART 100 UNIT/ML ~~LOC~~ SOLN
0.0000 [IU] | Freq: Three times a day (TID) | SUBCUTANEOUS | Status: DC
  Administered 2015-05-06: 2 [IU] via SUBCUTANEOUS

## 2015-05-05 MED ORDER — ONDANSETRON HCL 4 MG/2ML IJ SOLN: 4.0000 mg | Freq: Four times a day (QID) | INTRAMUSCULAR | Status: DC | PRN

## 2015-05-05 MED ORDER — GADOBENATE DIMEGLUMINE 529 MG/ML IV SOLN
20.0000 mL | Freq: Once | INTRAVENOUS | Status: AC | PRN
  Administered 2015-05-05: 20 mL via INTRAVENOUS

## 2015-05-05 MED ORDER — ONDANSETRON HCL 4 MG/2ML IJ SOLN
4.0000 mg | Freq: Once | INTRAMUSCULAR | Status: AC
  Administered 2015-05-05: 4 mg via INTRAVENOUS
  Filled 2015-05-05: qty 2

## 2015-05-05 MED ORDER — SODIUM CHLORIDE 0.9% FLUSH
3.0000 mL | Freq: Two times a day (BID) | INTRAVENOUS | Status: DC
  Administered 2015-05-06: 3 mL via INTRAVENOUS

## 2015-05-05 MED ORDER — TERAZOSIN HCL 2 MG PO CAPS
2.0000 mg | ORAL_CAPSULE | Freq: Every day | ORAL | Status: DC
  Administered 2015-05-06: 2 mg via ORAL
  Filled 2015-05-05: qty 1

## 2015-05-05 MED ORDER — LISINOPRIL 10 MG PO TABS
10.0000 mg | ORAL_TABLET | Freq: Every day | ORAL | Status: DC
  Administered 2015-05-06: 10 mg via ORAL
  Filled 2015-05-05: qty 1

## 2015-05-05 MED ORDER — INSULIN ASPART 100 UNIT/ML ~~LOC~~ SOLN: 0.0000 [IU] | Freq: Every day | SUBCUTANEOUS | Status: DC

## 2015-05-05 NOTE — Progress Notes (Signed)
Report received from [facility] in ED. Pt to be admitted to 5 W room 12.

## 2015-05-05 NOTE — H&P (Signed)
[facility] History and Physical  DYWANE COLSTON [id number] DOB: 01-May-1940 DOA: 05/05/2015  Referring physician: Dr Alvino Chapel - [facility] PCP: Cathlean Cower, MD   Chief Complaint: Dizziness  HPI: ANZIO OREJEL is a 75 y.o. male  Dizziness. Described as room spinning. Acute onset. Slight improvement when patient is at rest. No change with head movement. Has not taken anything for his symptoms. Symptoms did not resolve he came to the emergency room for treatment and workup. Associated with nausea and near syncope. Denies headache, confusion, focal weakness or numbness, chest pain, abdominal pain, visual changes. Constant. Improving.   Review of Systems:  Constitutional:  No weight loss, night sweats, Fevers, chills, fatigue.  HEENT:  No headaches, Difficulty swallowing,Tooth/dental problems,Sore throat, Cardio-vascular:  No chest pain, Orthopnea, PND, swelling in lower extremities, anasarca, dizziness, palpitations  GI:  No heartburn, indigestion, abdominal pain, nausea, vomiting, diarrhea, change in bowel habits, loss of appetite  Resp:   No shortness of breath with exertion or at rest. No excess mucus, no productive cough, No non-productive cough, No coughing up of blood.No change in color of mucus.No wheezing.No chest wall deformity  Skin:  no rash or lesions.  GU:  no dysuria, change in color of urine, no urgency or frequency. No flank pain.  Musculoskeletal:   No joint pain or swelling. No decreased range of motion. No back pain.  Psych:  No change in mood or affect. No depression or anxiety. No memory loss.  Neuro: Per HPI  All other systems were reviewed and are negative.  Past Medical History  Diagnosis Date  . Diabetes mellitus     Type II  . Hypertension   . BPH (benign prostatic hyperplasia)   . Carbuncle     recurrent MRSA carbuncles  . Arthritis   . Cataract   . Glaucoma   . Hyperlipidemia   . Disk prolapse   . Sinusitis 07/29/2012  . Male  hypogonadism 07/16/2014  . Allergic rhinitis 07/18/2014   Past Surgical History  Procedure Laterality Date  . Cataract extraction      x 2  . Colonoscopy    . Polypectomy     Social History:  reports that he quit smoking about 36 years ago. He has never used smokeless tobacco. He reports that he drinks about 2.5 oz of alcohol per week. He reports that he does not use illicit drugs.  No Known Allergies  Family History  Problem Relation Age of Onset  . Cancer Mother   . Hypertension Mother   . Colon cancer Neg Hx   . Heart attack Neg Hx   . Sudden death Neg Hx   . Diabetes Sister   . Cancer Brother     colon  . Hypertension Brother   . Hyperlipidemia Brother   . Stroke Brother   . Hypertension Sister      Prior to Admission medications   Medication Sig Start Date End Date Taking? Authorizing Provider  aspirin 81 MG tablet Take 81 mg by mouth daily.    Yes Historical Provider, MD  lisinopril (PRINIVIL,ZESTRIL) 10 MG tablet TAKE 1 TABLET BY MOUTH EVERY DAY 11/06/14  Yes Biagio Borg, MD  metFORMIN (GLUCOPHAGE) 500 MG tablet Take 1 tablet (500 mg total) by mouth 2 (two) times daily with a meal. 07/18/14  Yes Biagio Borg, MD  pravastatin (PRAVACHOL) 40 MG tablet TAKE 1 TABLET BY MOUTH EVERY DAY 03/23/15  Yes Biagio Borg, MD  terazosin (HYTRIN) 2 MG capsule Take  1 capsule (2 mg total) by mouth once. Patient taking differently: Take 2 mg by mouth daily.  02/10/15  Yes Biagio Borg, MD  azithromycin (ZITHROMAX) 250 MG tablet Take 2 tablets by mouth for 1 day and then 1 tablet by mouth daily for 4 days. Patient not taking: Reported on 05/05/2015 03/14/15   Golden Circle, FNP  benzonatate (TESSALON PERLES) 100 MG capsule Take 1 capsule (100 mg total) by mouth 3 (three) times daily as needed for cough. Patient not taking: Reported on 05/05/2015 03/14/15   Golden Circle, FNP  desloratadine (CLARINEX) 5 MG tablet Take 1 tablet (5 mg total) by mouth daily. Patient not taking: Reported on  01/22/2015 07/18/14   Biagio Borg, MD  diclofenac sodium (VOLTAREN) 1 % GEL Apply 2 g topically 4 (four) times daily. As needed for pain Patient not taking: Reported on 01/22/2015 07/18/14   Biagio Borg, MD  fluticasone [facility]) 50 MCG/ACT nasal spray Place 2 sprays into both nostrils daily. 07/18/14   Biagio Borg, MD  glucose blood (ONE TOUCH TEST STRIPS) test strip 1 each by Other route as needed. Use as instructed to check blood sugar once daily (dx 250.00)    Historical Provider, MD  [place] by Does not apply route. Use to check blood sugar once daily (dx code 250.00)    Historical Provider, MD  oxymetazoline (AFRIN) 0.05 % nasal spray Place 1 spray into both nostrils 2 (two) times daily as needed (nosebleed). Patient not taking: Reported on 05/05/2015 03/12/15   Merryl Hacker, MD   Physical Exam: Filed Vitals:   05/05/15 1430 05/05/15 1500 05/05/15 1530 05/05/15 1615  BP: 148/86 145/83 153/79 152/97  Pulse: 41 41 43 46  Temp:      TempSrc:      Resp: 10 10 11 10   Weight:      SpO2: 100% 100% 100% 100%    Wt Readings from Last 3 Encounters:  05/05/15 90.266 kg (199 lb)  03/14/15 90.266 kg (199 lb)  01/22/15 91.4 kg (201 lb 8 oz)    General:  Appears calm and comfortable Eyes:  PERRL, EOMI, normal lids, iris ENT:  grossly normal hearing, lips & tongue Neck:  no LAD, masses or thyromegaly Cardiovascular:  RRR, no m/r/g. No LE edema.  Respiratory:  CTA bilaterally, no w/r/r. Normal respiratory effort. Abdomen:  soft, ntnd Skin:  no rash or induration seen on limited exam Musculoskeletal:  grossly normal tone BUE/BLE Psychiatric:  grossly normal mood and affect, speech fluent and appropriate Neurologic:  Cranial nerves II through XII intact, no dysmetria bilaterally, moves all extremity coordinated fashion, sits unassisted, slight horizontal nystagmus bilaterally.           Labs on Admission:  Basic Metabolic Panel:  Recent Labs Lab 05/05/15 1157  NA  142  K 4.1  CL 109  CO2 27  GLUCOSE 126*  BUN 14  CREATININE 0.89  CALCIUM 8.8*   Liver Function Tests: No results for input(s): AST, ALT, ALKPHOS, BILITOT, PROT, ALBUMIN in the last 168 hours. No results for input(s): LIPASE, AMYLASE in the last 168 hours. No results for input(s): AMMONIA in the last 168 hours. CBC:  Recent Labs Lab 05/05/15 1157  WBC 2.3*  HGB 11.5*  HCT 35.1*  MCV 85.2  PLT 151   Cardiac Enzymes: No results for input(s): CKTOTAL, CKMB, CKMBINDEX, TROPONINI in the last 168 hours.  BNP (last 3 results) No results for input(s): BNP in  the last 8760 hours.  ProBNP (last 3 results) No results for input(s): PROBNP in the last 8760 hours.   Creatinine clearance cannot be calculated (Unknown ideal weight.)  CBG: No results for input(s): GLUCAP in the last 168 hours.  Radiological Exams on Admission: Dg Chest 2 View  05/05/2015  CLINICAL DATA:  Dizziness, lightheadedness, nausea.  Prior smoker. EXAM: CHEST  2 VIEW COMPARISON:  07/18/2014 FINDINGS: Calcified granulomas in the right mid lung. Heart and mediastinal contours are within normal limits. No focal opacities or effusions. No acute bony abnormality. IMPRESSION: No active cardiopulmonary disease. Electronically Signed   By: Rolm Baptise M.D.   On: 05/05/2015 13:37     Assessment/Plan Active Problems:   Hyperlipidemia   Essential hypertension   Dizziness   Vertigo   Prediabetes   GERD (gastroesophageal reflux disease)   BPH (benign prostatic hyperplasia)   Dizziness: CNS versus cardiac etiology. Patient with baseline bradycardia typically in the 50s but now sustained in the 40s. Orthostatics normal. Patient's symptoms gradually improving. No head imaging done to this point. Symptoms are not positional. Chemical stress test 1 year no normal. Trop nml. EKG sinus.  - Telemetry  - F/u Neuro, Cards consults - MRI/MRA head and neck - Echo - Pt/OT - neuro checks - ASA  HTN: - continue  lisinopril  DM: pre DM on by last A1c of 6.1. On metformin only - A1c - SSI   BPH:  Continue terazosin  GERD: - contionue ppi    Code Status: FULL  DVT Prophylaxis: lovenox Family Communication: wife Disposition Plan: Pending Improvement    Dolores Ewing J, MD Family Medicine [facility] [email or web address] Password [email or web address]

## 2015-05-05 NOTE — Consult Note (Signed)
ELECTROPHYSIOLOGY CONSULT NOTE    Primary Care Physician: Cathlean Cower, MD Referring Physician:  Dr Alvino Chapel  Admit Date: 05/05/2015  Reason for consultation: bradycardia  Jonathan Lewis is a 75 y.o. male with a h/o chronic asymptomatic sinus bradycardia who now presents with vertiginous spinning sensation.  He reports being in good health this am.  He was in his kitchen getting pans out of from under the counter.  He reports that when he looked up, he had abrupt onset of "room spinning".  He reports that he was very unsteady and "staggery".  He became nauseated with the episode.  He denies chest pain, SOB, or diaphoresis.  He did not have presyncope or syncope.  He found the episode to be very concerning and thus presented to [facility] for further evaluation.   On telemetry, he has sinus rhythm with heart rates 45-50s.  Upon standing (per Dr Alvino Chapel) his heart rate increased to 60s.   He has had sinus bradycardia frequently documented in the past without symptoms.  He is very active.  His energy/ exercise tolerance are preserved.    Today, he denies symptoms of palpitations, chest pain, shortness of breath,  lower extremity edema,  or other neurologic sequela. The patient is tolerating medications without difficulties and is otherwise without complaint today.   Past Medical History  Diagnosis Date  . Diabetes mellitus     Type II  . Hypertension   . BPH (benign prostatic hyperplasia)   . Carbuncle     recurrent MRSA carbuncles  . Arthritis   . Cataract   . Glaucoma   . Hyperlipidemia   . Disk prolapse   . Sinusitis 07/29/2012  . Male hypogonadism 07/16/2014  . Allergic rhinitis 07/18/2014  . Sinus bradycardia     chronic, asymptomatic   Past Surgical History  Procedure Laterality Date  . Cataract extraction      x 2  . Colonoscopy    . Polypectomy      .  stroke: mapping our early stages of recovery book   Does not apply Once  . aspirin  81 mg Oral Daily  . enoxaparin  (LOVENOX) injection  40 mg Subcutaneous Q24H  . insulin aspart  0-5 Units Subcutaneous QHS  . insulin aspart  0-9 Units Subcutaneous TID WC  . lisinopril  10 mg Oral Daily  . pravastatin  40 mg Oral Daily  . sodium chloride flush  3 mL Intravenous Q12H  . terazosin  2 mg Oral Daily      No Known Allergies  Social History   Social History  . Marital Status: Married    Spouse Name: N/A  . Number of Children: 7  . Years of Education: 7   Occupational History  . pastor    Social History Main Topics  . Smoking status: Former Smoker    Quit date: 10/03/1978  . Smokeless tobacco: Never Used  . Alcohol Use: 2.5 oz/week    5 drink(s) per week     Comment: pint, occasion beer  . Drug Use: No  . Sexual Activity:    Partners: Female   Other Topics Concern  . Not on file   Social History Narrative   9th grade.  Married '60. Jonathan Lewis 3 sons -'60 '64, '66; 4 dtrs -'60, '62, '61, '65; 15 grands, 25 great-grands.   Jonathan Lewis, some cleaning work. Lives alone with wife.   ACD- discussed living will and HCPOA (July '14) provided packet.  Family History  Problem Relation Age of Onset  . Cancer Mother   . Hypertension Mother   . Colon cancer Neg Hx   . Heart attack Neg Hx   . Sudden death Neg Hx   . Diabetes Sister   . Cancer Brother     colon  . Hypertension Brother   . Hyperlipidemia Brother   . Stroke Brother   . Hypertension Sister     ROS- All systems are reviewed and negative except as per the HPI above  Physical Exam: Telemetry: sinus bradycardia, no AV block or prolonged pauses Filed Vitals:   05/05/15 1430 05/05/15 1500 05/05/15 1530 05/05/15 1615  BP: 148/86 145/83 153/79 152/97  Pulse: 41 41 43 46  Temp:      TempSrc:      Resp: 10 10 11 10   Weight:      SpO2: 100% 100% 100% 100%    GEN- The patient is well appearing, alert and oriented x 3 today.   Head- normocephalic, atraumatic Eyes-  Sclera clear, conjunctiva pink Ears- hearing  intact Oropharynx- clear Neck- supple,   Lungs- Clear to ausculation bilaterally, normal work of breathing Heart- Regular rate and rhythm, no murmurs, rubs or gallops, PMI not laterally displaced GI- soft, NT, ND, + BS Extremities- no clubbing, cyanosis, or edema MS- no significant deformity or atrophy Skin- no rash or lesion Psych- euthymic mood, full affect Neuro- strength and sensation are intact, no nystagmus currently on exam though this was described earlier by ED staff  EKG reveals sinus bradycardia 45 bpm, PR 216 msec, QRS 96 msec, Qtc 403 msec, otherwise normal ekg  Labs:   Lab Results  Component Value Date   WBC 2.3* 05/05/2015   HGB 11.5* 05/05/2015   HCT 35.1* 05/05/2015   MCV 85.2 05/05/2015   PLT 151 05/05/2015    Recent Labs Lab 05/05/15 1157  NA 142  K 4.1  CL 109  CO2 27  BUN 14  CREATININE 0.89  CALCIUM 8.8*  GLUCOSE 126*   No results found for: CKTOTAL, CKMB, CKMBINDEX, TROPONINI Lab Results  Component Value Date   CHOL 135 01/21/2015   CHOL 136 07/15/2014   CHOL 167 01/15/2014   Lab Results  Component Value Date   HDL 37.00* 01/21/2015   HDL 31.70* 07/15/2014   HDL 29.90* 01/15/2014   Lab Results  Component Value Date   LDLCALC 80 01/21/2015   LDLCALC 120* 01/15/2014   LDLCALC 92 07/15/2013   Lab Results  Component Value Date   TRIG 90.0 01/21/2015   TRIG 393.0* 07/15/2014   TRIG 84.0 01/15/2014   Lab Results  Component Value Date   CHOLHDL 4 01/21/2015   CHOLHDL 4 07/15/2014   CHOLHDL 6 01/15/2014   Lab Results  Component Value Date   LDLDIRECT 79.0 07/15/2014   ED physician note reviewed and patient discussed with Dr Alvino Chapel. Myoview  09/08/14 low risk (reviewed) Multiple prior ekgs going back to 2009 are reviewed and all show sinus bradycardia   ASSESSMENT AND PLAN:   1. Vertiginous spinning History is not consistent with cardiac cause for dizziness. Most likely benign position vertigo, though other neurologic  causes should be considered Observe on telemetry, echo as planned No further CV workup planned presently  2. Asymptomatic chronic sinus bradycardia The patient has chronic sinus bradycardia but with preserved exercise tolerance.  His current presentation is very unlikely to be due to bradycardia.  Nausea may have contributed to worsening of chronic bradycardia by vagal  stimulation. Would check TSH. Echo as planned Once vertiginous symptoms are resolved and workup is completed, would ambulate the patient in the halls to assess heart rate response to activity. No indication for pacing presently  Electrophysiology team to see as needed while here. Please call with questions.  Thompson Grayer MD, [facility] 05/05/2015 4:47 PM

## 2015-05-05 NOTE — Telephone Encounter (Signed)
Patient Name: Jonathan Lewis DOB: 05-Feb-1941 Initial Comment Caller states her husband has been dizzy and vomiting. Nurse Assessment Nurse: Marcelline Deist, RN, Lynda Date/Time (Eastern Time): 05/05/2015 10:51:37 AM Confirm and document reason for call. If symptomatic, describe symptoms. You must click the next button to save text entered. ---Caller states her husband has been dizzy and vomiting for an hour. Is very weak now. No fever. Has the patient traveled out of the country within the last 30 days? ---Not Applicable Does the patient have any new or worsening symptoms? ---Yes Will a triage be completed? ---Yes Related visit to physician within the last 2 weeks? ---No Does the PT have any chronic conditions? (i.e. diabetes, asthma, etc.) ---Yes List chronic conditions. ---diabetes, takes BP rx. Is this a behavioral health or substance abuse call? ---No Guidelines Guideline Title Affirmed Question Affirmed Notes Dizziness - Vertigo SEVERE dizziness (vertigo) (e.g., unable to walk without assistance) Final Disposition User Go to ED Now (or PCP triage) Marcelline Deist, RN, Lynda Comments Caller states her husband is diabetic, takes BP rx. He states the room was moving when this first started. He is extremely dizzy and has been vomiting. This started abruptly. Referrals [facility] - ED Disagree/Comply: Comply

## 2015-05-05 NOTE — ED Notes (Signed)
Attempted report 

## 2015-05-05 NOTE — ED Provider Notes (Signed)
CSN: [id number]     Arrival date & time 05/05/15  1143 History   First MD Initiated Contact with Patient 05/05/15 1244     Chief Complaint  Patient presents with  . Dizziness      Patient is a 75 y.o. male presenting with dizziness. The history is provided by the patient.  Dizziness Associated symptoms: nausea   Associated symptoms: no chest pain, no diarrhea, no headaches, no shortness of breath, no vomiting and no weakness    patient presents with dizziness. States he was at the [employer] began to feel the room spinning around. He also felt his if he was going to throw up and felt that he may pass out. No headache. No confusion. No localizing numbness or weakness. No chest pain. No abdominal pain. No visual changes. States the room is not moving as much as it was but he still feels dizzy. He does have a somewhat slow heart rate, which appears to be somewhat chronically the patient. No difficulty moving his arms or legs. He has had an occasional cough.  Past Medical History  Diagnosis Date  . Diabetes mellitus     Type II  . Hypertension   . BPH (benign prostatic hyperplasia)   . Carbuncle     recurrent MRSA carbuncles  . Arthritis   . Cataract   . Glaucoma   . Hyperlipidemia   . Disk prolapse   . Sinusitis 07/29/2012  . Male hypogonadism 07/16/2014  . Allergic rhinitis 07/18/2014   Past Surgical History  Procedure Laterality Date  . Cataract extraction      x 2  . Colonoscopy    . Polypectomy     Family History  Problem Relation Age of Onset  . Cancer Mother   . Hypertension Mother   . Colon cancer Neg Hx   . Heart attack Neg Hx   . Sudden death Neg Hx   . Diabetes Sister   . Cancer Brother     colon  . Hypertension Brother   . Hyperlipidemia Brother   . Stroke Brother   . Hypertension Sister    Social History  Substance Use Topics  . Smoking status: Former Smoker    Quit date: 10/03/1978  . Smokeless tobacco: Never Used  . Alcohol Use: 2.5 oz/week    5  drink(s) per week     Comment: pint, occasion beer    Review of Systems  Constitutional: Negative for activity change and appetite change.  Eyes: Negative for pain.  Respiratory: Positive for cough. Negative for chest tightness and shortness of breath.   Cardiovascular: Negative for chest pain and leg swelling.  Gastrointestinal: Positive for nausea. Negative for vomiting, abdominal pain and diarrhea.  Genitourinary: Negative for flank pain.  Musculoskeletal: Negative for back pain and neck stiffness.  Skin: Negative for rash.  Neurological: Positive for dizziness and light-headedness. Negative for syncope, weakness, numbness and headaches.  Psychiatric/Behavioral: Negative for behavioral problems.      Allergies  Review of patient's allergies indicates no known allergies.  Home Medications   Prior to Admission medications   Medication Sig Start Date End Date Taking? Authorizing Provider  aspirin 81 MG tablet Take 81 mg by mouth daily.    Yes Historical Provider, MD  lisinopril (PRINIVIL,ZESTRIL) 10 MG tablet TAKE 1 TABLET BY MOUTH EVERY DAY 11/06/14  Yes Biagio Borg, MD  metFORMIN (GLUCOPHAGE) 500 MG tablet Take 1 tablet (500 mg total) by mouth 2 (two) times daily with a meal.  07/18/14  Yes Biagio Borg, MD  pravastatin (PRAVACHOL) 40 MG tablet TAKE 1 TABLET BY MOUTH EVERY DAY 03/23/15  Yes Biagio Borg, MD  terazosin (HYTRIN) 2 MG capsule Take 1 capsule (2 mg total) by mouth once. Patient taking differently: Take 2 mg by mouth daily.  02/10/15  Yes Biagio Borg, MD  azithromycin (ZITHROMAX) 250 MG tablet Take 2 tablets by mouth for 1 day and then 1 tablet by mouth daily for 4 days. Patient not taking: Reported on 05/05/2015 03/14/15   Golden Circle, FNP  benzonatate (TESSALON PERLES) 100 MG capsule Take 1 capsule (100 mg total) by mouth 3 (three) times daily as needed for cough. Patient not taking: Reported on 05/05/2015 03/14/15   Golden Circle, FNP  desloratadine (CLARINEX) 5  MG tablet Take 1 tablet (5 mg total) by mouth daily. Patient not taking: Reported on 01/22/2015 07/18/14   Biagio Borg, MD  diclofenac sodium (VOLTAREN) 1 % GEL Apply 2 g topically 4 (four) times daily. As needed for pain Patient not taking: Reported on 01/22/2015 07/18/14   Biagio Borg, MD  fluticasone [facility]) 50 MCG/ACT nasal spray Place 2 sprays into both nostrils daily. 07/18/14   Biagio Borg, MD  glucose blood (ONE TOUCH TEST STRIPS) test strip 1 each by Other route as needed. Use as instructed to check blood sugar once daily (dx 250.00)    Historical Provider, MD  [place] by Does not apply route. Use to check blood sugar once daily (dx code 250.00)    Historical Provider, MD  oxymetazoline (AFRIN) 0.05 % nasal spray Place 1 spray into both nostrils 2 (two) times daily as needed (nosebleed). Patient not taking: Reported on 05/05/2015 03/12/15   Barbette Hair Horton, MD   BP 160/95 mmHg  Pulse 46  Temp(Src) 97.8 F (36.6 C) (Oral)  Resp 18  Wt 199 lb (90.266 kg)  SpO2 100% Physical Exam  Constitutional: He appears well-developed.  Eyes:   Some mild nystagmus with lateral gaze  Eye movements otherwise intact.  Neck: Neck supple.  Cardiovascular:   bradycardia  Pulmonary/Chest: Effort normal.  Abdominal: Soft.  Musculoskeletal: He exhibits no edema.  Neurological: He is alert.  Skin: Skin is warm.  Psychiatric: He has a normal mood and affect.    ED Course  Procedures (including critical care time) Labs Review Labs Reviewed  BASIC METABOLIC PANEL - Abnormal; Notable for the following:    Glucose, Bld 126 (*)    Calcium 8.8 (*)    All other components within normal limits  CBC - Abnormal; Notable for the following:    WBC 2.3 (*)    RBC 4.12 (*)    Hemoglobin 11.5 (*)    HCT 35.1 (*)    All other components within normal limits  URINALYSIS, ROUTINE W REFLEX MICROSCOPIC (NOT AT [facility])  Randolm Idol, ED    Imaging Review Dg Chest 2 View  05/05/2015   CLINICAL DATA:  Dizziness, lightheadedness, nausea.  Prior smoker. EXAM: CHEST  2 VIEW COMPARISON:  07/18/2014 FINDINGS: Calcified granulomas in the right mid lung. Heart and mediastinal contours are within normal limits. No focal opacities or effusions. No acute bony abnormality. IMPRESSION: No active cardiopulmonary disease. Electronically Signed   By: Rolm Baptise M.D.   On: 05/05/2015 13:37   I have personally reviewed and evaluated these images and lab results as part of my medical decision-making.   EKG Interpretation   Date/Time:  Tuesday May 05 2015 11:55:50 EST Ventricular Rate:  45 PR Interval:  216 QRS Duration: 94 QT Interval:  466 QTC Calculation: 403 R Axis:   24 Text Interpretation:  Sinus bradycardia with 1st degree A-V block  Otherwise normal ECG Confirmed by Alvino Chapel  MD, Ovid Curd [phone]) on  05/05/2015 12:47:44 PM      MDM   Final diagnoses:  Dizziness  Bradycardia     patient presented with dizziness. Unclear if his vertigo or lightheadedness and has some components of both. He is somewhat bradycardic. Good blood pressure not orthostatic. Finger-nose intact but does have nystagmus. Will get medicine admission an MRI to rule out central cause vertigo. Will need to be monitored for the bradycardia. He is not on calcium channel blocker or beta blocker.    Davonna Belling, MD 05/05/15 [phone]

## 2015-05-05 NOTE — ED Notes (Signed)
Pt reports he was working outside this morning about 4 hours ago and being to feel dizzy. No neuro deficits noted. Also reports nausea.

## 2015-05-05 NOTE — ED Notes (Signed)
Pt transported to MRI 

## 2015-05-06 ENCOUNTER — Observation Stay (HOSPITAL_BASED_OUTPATIENT_CLINIC_OR_DEPARTMENT_OTHER): Payer: Medicare Other

## 2015-05-06 DIAGNOSIS — I6789 Other cerebrovascular disease: Secondary | ICD-10-CM

## 2015-05-06 DIAGNOSIS — N400 Enlarged prostate without lower urinary tract symptoms: Secondary | ICD-10-CM | POA: Diagnosis not present

## 2015-05-06 DIAGNOSIS — E785 Hyperlipidemia, unspecified: Secondary | ICD-10-CM

## 2015-05-06 DIAGNOSIS — I10 Essential (primary) hypertension: Secondary | ICD-10-CM

## 2015-05-06 LAB — COMPREHENSIVE METABOLIC PANEL
ALK PHOS: 37 U/L — AB (ref 38–126)
ALT: 13 U/L — AB (ref 17–63)
AST: 19 U/L (ref 15–41)
Albumin: 3.2 g/dL — ABNORMAL LOW (ref 3.5–5.0)
Anion gap: 7 (ref 5–15)
BILIRUBIN TOTAL: 0.7 mg/dL (ref 0.3–1.2)
BUN: 13 mg/dL (ref 6–20)
CALCIUM: 8.8 mg/dL — AB (ref 8.9–10.3)
CO2: 27 mmol/L (ref 22–32)
CREATININE: 1.05 mg/dL (ref 0.61–1.24)
Chloride: 108 mmol/L (ref 101–111)
Glucose, Bld: 103 mg/dL — ABNORMAL HIGH (ref 65–99)
Potassium: 3.7 mmol/L (ref 3.5–5.1)
Sodium: 142 mmol/L (ref 135–145)
Total Protein: 5.8 g/dL — ABNORMAL LOW (ref 6.5–8.1)

## 2015-05-06 LAB — LIPID PANEL
CHOLESTEROL: 168 mg/dL (ref 0–200)
HDL: 34 mg/dL — AB (ref >40)
LDL Cholesterol: 108 mg/dL — ABNORMAL HIGH (ref 0–99)
TRIGLYCERIDES: 131 mg/dL (ref <150)
Total CHOL/HDL Ratio: 4.9 RATIO
VLDL: 26 mg/dL (ref 0–40)

## 2015-05-06 LAB — HEMOGLOBIN A1C
Hgb A1c MFr Bld: 6.3 % — ABNORMAL HIGH (ref 4.8–5.6)
Mean Plasma Glucose: 134 mg/dL

## 2015-05-06 LAB — GLUCOSE, CAPILLARY
Glucose-Capillary: 103 mg/dL — ABNORMAL HIGH (ref 65–99)
Glucose-Capillary: 152 mg/dL — ABNORMAL HIGH (ref 65–99)

## 2015-05-06 LAB — CBC
HEMATOCRIT: 34.6 % — AB (ref 39.0–52.0)
HEMOGLOBIN: 11.7 g/dL — AB (ref 13.0–17.0)
MCH: 28.5 pg (ref 26.0–34.0)
MCHC: 33.8 g/dL (ref 30.0–36.0)
MCV: 84.4 fL (ref 78.0–100.0)
PLATELETS: 147 10*3/uL — AB (ref 150–400)
RBC: 4.1 MIL/uL — AB (ref 4.22–5.81)
RDW: 13.2 % (ref 11.5–15.5)
WBC: 3.4 10*3/uL — AB (ref 4.0–10.5)

## 2015-05-06 MED ORDER — MECLIZINE HCL 32 MG PO TABS: 32.0000 mg | ORAL_TABLET | Freq: Three times a day (TID) | ORAL | Status: DC | PRN

## 2015-05-06 NOTE — Discharge Summary (Signed)
Physician Discharge Summary  Jonathan Lewis [id number] DOB: 10-18-1940 DOA: 05/05/2015  PCP: Jonathan Cower, MD  Admit date: 05/05/2015 Discharge date: 05/06/2015  Time spent: 35 minutes  Recommendations for Outpatient Follow-up:  1. Please follow up on heart rates, initially presented with sinus bradycardia, resolving by the following day.  2. Presented with complaints of dizziness describing sensation of room spinning spinning with change in position.  3. Radiology recommending repeat MR with internal auditory canal protocol with thin section imaging to follow up on 3 mm lesion involving left internal auditory canal   Discharge Diagnoses:  Active Problems:   Hyperlipidemia   Essential hypertension   Dizziness   Vertigo   Prediabetes   GERD (gastroesophageal reflux disease)   BPH (benign prostatic hyperplasia)   Discharge Condition: Stable  Diet recommendation: Regular  Filed Weights   05/05/15 1155 05/05/15 1936  Weight: 90.266 kg (199 lb) 89.449 kg (197 lb 3.2 oz)    History of present illness:  Jonathan Lewis is a 75 y.o. male  Dizziness. Described as room spinning. Acute onset. Slight improvement when patient is at rest. No change with head movement. Has not taken anything for his symptoms. Symptoms did not resolve he came to the emergency room for treatment and workup. Associated with nausea and near syncope. Denies headache, confusion, focal weakness or numbness, chest pain, abdominal pain, visual changes. Constant. Improving.  Hospital Course:  [name] is a pleasant 75 year old gentleman with a past medical history of type 2 diabetes mellitus, dyslipidemia, hypertension, presenting to the emergency department on 05/05/2015 with complaints of dizziness. He reported symptoms started acutely bent over to pick something up describing sensation of "room spinning" those associated with nausea and vomiting. Symptoms appear to be consistent with benign positional vertigo,  however, he was found to be bradycardic on presentation. He was evaluated by Dr. Rayann Lewis of cardiology who did not feel symptoms with cardiac origin and likely related to vertigo. He did not recommend further cardiac workup. Patient had an MRI/MRA of the brain which did not reveal evidence of CVA or significant stenosis. Radiology reported 3 mm nodular enhancing lesion in the left internal auditory canal that could represent a tiny vestibular schwannoma. Radiology recommended follow-up MRI, performing internal auditory canal protocol with thin section imaging. By the following day patient reported feeling much better with resolution to his symptoms. He was tolerating by mouth intake, denied nausea, vomiting, fevers or chills. He ambulated down the hallway without any issues. He was discharged home in stable condition on 05/06/2015. Suspect symptoms related to benign positional vertigo for which she was given a prescription for meclizine to be taken as needed.   Consultations:  Cardiology   Discharge Exam: Filed Vitals:   05/06/15 0819 05/06/15 1128  BP: 113/96 112/71  Pulse: 53 63  Temp: 98 F (36.7 C) 98.1 F (36.7 C)  Resp: 18 20    General: patient is awake and alert, no acute distress, ambulated down the hallway without any difficulties  Cardiovascular: regular rate and rhythm normal S1-S2 no murmurs or gallops  Respiratory: normal respiratory effort, lungs are clear to auscultation  Abdomen: Soft nontender nondistended  Neurological: Nonfocal  Discharge Instructions   Discharge Instructions    Call MD for:  difficulty breathing, headache or visual disturbances    Complete by:  As directed      Call MD for:  extreme fatigue    Complete by:  As directed      Call MD for:  hives    Complete by:  As directed      Call MD for:  persistant dizziness or light-headedness    Complete by:  As directed      Call MD for:  persistant nausea and vomiting    Complete by:  As directed       Call MD for:  redness, tenderness, or signs of infection (pain, swelling, redness, odor or green/yellow discharge around incision site)    Complete by:  As directed      Call MD for:  severe uncontrolled pain    Complete by:  As directed      Call MD for:  temperature >100.4    Complete by:  As directed      Call MD for:    Complete by:  As directed      Diet - low sodium heart healthy    Complete by:  As directed      Increase activity slowly    Complete by:  As directed           Current Discharge Medication List    START taking these medications   Details  meclizine (ANTIVERT) 32 MG tablet Take 1 tablet (32 mg total) by mouth 3 (three) times daily as needed. Qty: 12 tablet, Refills: 0      CONTINUE these medications which have NOT CHANGED   Details  aspirin 81 MG tablet Take 81 mg by mouth daily.     lisinopril (PRINIVIL,ZESTRIL) 10 MG tablet TAKE 1 TABLET BY MOUTH EVERY DAY Qty: 30 tablet, Refills: 5    metFORMIN (GLUCOPHAGE) 500 MG tablet Take 1 tablet (500 mg total) by mouth 2 (two) times daily with a meal. Qty: 60 tablet, Refills: 5    pravastatin (PRAVACHOL) 40 MG tablet TAKE 1 TABLET BY MOUTH EVERY DAY Qty: 30 tablet, Refills: 4    terazosin (HYTRIN) 2 MG capsule Take 1 capsule (2 mg total) by mouth once. Qty: 30 capsule, Refills: 5    desloratadine (CLARINEX) 5 MG tablet Take 1 tablet (5 mg total) by mouth daily. Qty: 90 tablet, Refills: 3    diclofenac sodium (VOLTAREN) 1 % GEL Apply 2 g topically 4 (four) times daily. As needed for pain Qty: 100 g, Refills: 5    fluticasone (FLONASE) 50 MCG/ACT nasal spray Place 2 sprays into both nostrils daily. Qty: 16 g, Refills: 2    glucose blood (ONE TOUCH TEST STRIPS) test strip 1 each by Other route as needed. Use as instructed to check blood sugar once daily (dx 250.00)    ONE TOUCH LANCETS MISC by Does not apply route. Use to check blood sugar once daily (dx code 250.00)       No Known Allergies Follow-up  Information    Follow up with Jonathan Cower, MD In 1 week.   Specialties:  Internal Medicine, Radiology   Contact information:   Jonathan Lewis [id number] [phone]        The results of significant diagnostics from this hospitalization (including imaging, microbiology, ancillary and laboratory) are listed below for reference.    Significant Diagnostic Studies: Dg Chest 2 View  05/05/2015  CLINICAL DATA:  Dizziness, lightheadedness, nausea.  Prior smoker. EXAM: CHEST  2 VIEW COMPARISON:  07/18/2014 FINDINGS: Calcified granulomas in the right mid lung. Heart and mediastinal contours are within normal limits. No focal opacities or effusions. No acute bony abnormality. IMPRESSION: No active cardiopulmonary disease. Electronically Signed   By: Lennette Bihari  [place] M.D.   On: 05/05/2015 13:37   Mr Virgel Paling Wo Contrast  05/05/2015  CLINICAL DATA:  75 year old diabetic hypertensive male with hyperlipidemia presenting with vertigo. Bradycardia. Initial encounter. EXAM: MRI HEAD WITHOUT CONTRAST MRA HEAD WITHOUT CONTRAST MRA NECK WITHOUT CONTRAST TECHNIQUE: Multiplanar, multiecho pulse sequences of the brain and surrounding structures were obtained without intravenous contrast. Angiographic images of the Circle of Willis were obtained using MRA technique without intravenous contrast. Angiographic images of the neck were obtained using MRA technique without intravenous contrast. Carotid stenosis measurements (when applicable) are obtained utilizing NASCET criteria, using the distal internal carotid diameter as the denominator. COMPARISON:  None. FINDINGS: MRI HEAD FINDINGS No acute infarct or intracranial hemorrhage. Minimal small vessel disease changes. 3 mm nodular enhancing lesion left internal auditory canal possibly a tiny vestibular schwannoma. Vessel is a secondary less likely consideration. When the patient has follow-up MR, this can be performed utilizing internal auditory canal protocol  with thin-section imaging. Mild atrophy without hydrocephalus. Decreased signal intensity of bone marrow may be related to patient's habitus. Correlation with CBC to exclude anemia contributing to this appearance may be considered Cervical medullary junction and pituitary region unremarkable. Mild congenitally narrowed upper cervical canal. Post lens replacement.  Minimal exophthalmos. MRA HEAD FINDINGS Anterior circulation without medium or large size vessel significant stenosis or occlusion. Moderate narrowing middle cerebral artery branches greater on the right. Slightly hypoplastic A1 segment right anterior cerebral artery. Ectatic vertebral arteries without significant stenosis. Nonvisualized right posterior inferior cerebellar artery. Ectatic basilar artery with mild narrowing proximally. No high-grade stenosis. Narrowing and irregularity anterior inferior cerebellar arteries and superior cerebellar arteries bilaterally. Mild narrowing posterior cerebral artery proximally. Moderate narrowing distal branches bilaterally. Minimal bulge P1 segment left posterior cerebral artery without discrete saccular aneurysm. MRA NECK FINDINGS Common origin innominate and left common carotid artery. Slightly ectatic aortic arch. Ectatic common carotid arteries bilaterally. Markedly ectatic internal carotid arteries bilaterally. No evidence the hemodynamically significant stenosis involving either carotid bifurcation. Codominant vertebral arteries with mild ectasia without significant narrowing. IMPRESSION: MRI HEAD No acute infarct or intracranial hemorrhage. Minimal small vessel disease changes. 3 mm nodular enhancing lesion left internal auditory canal possibly a tiny vestibular schwannoma. Vessel is a secondary less likely consideration. When the patient has follow-up MR, this can be performed utilizing internal auditory canal protocol with thin-section imaging. Mild atrophy without hydrocephalus. MRA HEAD No medium or  large size vessel significant stenosis or occlusion. Moderate narrowing middle cerebral artery branches greater on the right. Nonvisualized right posterior inferior cerebellar artery. Narrowing and irregularity anterior inferior cerebellar arteries and superior cerebellar arteries bilaterally. Mild narrowing posterior cerebral artery proximally. Moderate narrowing distal branches bilaterally. Minimal bulge P1 segment left posterior cerebral artery without discrete saccular aneurysm. MRA NECK Ectatic common carotid arteries bilaterally. Markedly ectatic internal carotid arteries bilaterally. No evidence the hemodynamically significant stenosis involving either carotid bifurcation. Codominant vertebral arteries with mild ectasia without significant narrowing. Electronically Signed   By: Genia Del M.D.   On: 05/05/2015 20:08   Mr Angiogram Neck W Wo Contrast  05/05/2015  CLINICAL DATA:  75 year old diabetic hypertensive male with hyperlipidemia presenting with vertigo. Bradycardia. Initial encounter. EXAM: MRI HEAD WITHOUT CONTRAST MRA HEAD WITHOUT CONTRAST MRA NECK WITHOUT CONTRAST TECHNIQUE: Multiplanar, multiecho pulse sequences of the brain and surrounding structures were obtained without intravenous contrast. Angiographic images of the Circle of Willis were obtained using MRA technique without intravenous contrast. Angiographic images of the neck were obtained using MRA technique without intravenous contrast. Carotid  stenosis measurements (when applicable) are obtained utilizing NASCET criteria, using the distal internal carotid diameter as the denominator. COMPARISON:  None. FINDINGS: MRI HEAD FINDINGS No acute infarct or intracranial hemorrhage. Minimal small vessel disease changes. 3 mm nodular enhancing lesion left internal auditory canal possibly a tiny vestibular schwannoma. Vessel is a secondary less likely consideration. When the patient has follow-up MR, this can be performed utilizing internal  auditory canal protocol with thin-section imaging. Mild atrophy without hydrocephalus. Decreased signal intensity of bone marrow may be related to patient's habitus. Correlation with CBC to exclude anemia contributing to this appearance may be considered Cervical medullary junction and pituitary region unremarkable. Mild congenitally narrowed upper cervical canal. Post lens replacement.  Minimal exophthalmos. MRA HEAD FINDINGS Anterior circulation without medium or large size vessel significant stenosis or occlusion. Moderate narrowing middle cerebral artery branches greater on the right. Slightly hypoplastic A1 segment right anterior cerebral artery. Ectatic vertebral arteries without significant stenosis. Nonvisualized right posterior inferior cerebellar artery. Ectatic basilar artery with mild narrowing proximally. No high-grade stenosis. Narrowing and irregularity anterior inferior cerebellar arteries and superior cerebellar arteries bilaterally. Mild narrowing posterior cerebral artery proximally. Moderate narrowing distal branches bilaterally. Minimal bulge P1 segment left posterior cerebral artery without discrete saccular aneurysm. MRA NECK FINDINGS Common origin innominate and left common carotid artery. Slightly ectatic aortic arch. Ectatic common carotid arteries bilaterally. Markedly ectatic internal carotid arteries bilaterally. No evidence the hemodynamically significant stenosis involving either carotid bifurcation. Codominant vertebral arteries with mild ectasia without significant narrowing. IMPRESSION: MRI HEAD No acute infarct or intracranial hemorrhage. Minimal small vessel disease changes. 3 mm nodular enhancing lesion left internal auditory canal possibly a tiny vestibular schwannoma. Vessel is a secondary less likely consideration. When the patient has follow-up MR, this can be performed utilizing internal auditory canal protocol with thin-section imaging. Mild atrophy without hydrocephalus.  MRA HEAD No medium or large size vessel significant stenosis or occlusion. Moderate narrowing middle cerebral artery branches greater on the right. Nonvisualized right posterior inferior cerebellar artery. Narrowing and irregularity anterior inferior cerebellar arteries and superior cerebellar arteries bilaterally. Mild narrowing posterior cerebral artery proximally. Moderate narrowing distal branches bilaterally. Minimal bulge P1 segment left posterior cerebral artery without discrete saccular aneurysm. MRA NECK Ectatic common carotid arteries bilaterally. Markedly ectatic internal carotid arteries bilaterally. No evidence the hemodynamically significant stenosis involving either carotid bifurcation. Codominant vertebral arteries with mild ectasia without significant narrowing. Electronically Signed   By: Genia Del M.D.   On: 05/05/2015 20:08   Mr Jeri Cos [id number] Contrast  05/05/2015  CLINICAL DATA:  75 year old diabetic hypertensive male with hyperlipidemia presenting with vertigo. Bradycardia. Initial encounter. EXAM: MRI HEAD WITHOUT CONTRAST MRA HEAD WITHOUT CONTRAST MRA NECK WITHOUT CONTRAST TECHNIQUE: Multiplanar, multiecho pulse sequences of the brain and surrounding structures were obtained without intravenous contrast. Angiographic images of the Circle of Willis were obtained using MRA technique without intravenous contrast. Angiographic images of the neck were obtained using MRA technique without intravenous contrast. Carotid stenosis measurements (when applicable) are obtained utilizing NASCET criteria, using the distal internal carotid diameter as the denominator. COMPARISON:  None. FINDINGS: MRI HEAD FINDINGS No acute infarct or intracranial hemorrhage. Minimal small vessel disease changes. 3 mm nodular enhancing lesion left internal auditory canal possibly a tiny vestibular schwannoma. Vessel is a secondary less likely consideration. When the patient has follow-up MR, this can be performed utilizing  internal auditory canal protocol with thin-section imaging. Mild atrophy without hydrocephalus. Decreased signal intensity of bone marrow may be related to patient's  habitus. Correlation with CBC to exclude anemia contributing to this appearance may be considered Cervical medullary junction and pituitary region unremarkable. Mild congenitally narrowed upper cervical canal. Post lens replacement.  Minimal exophthalmos. MRA HEAD FINDINGS Anterior circulation without medium or large size vessel significant stenosis or occlusion. Moderate narrowing middle cerebral artery branches greater on the right. Slightly hypoplastic A1 segment right anterior cerebral artery. Ectatic vertebral arteries without significant stenosis. Nonvisualized right posterior inferior cerebellar artery. Ectatic basilar artery with mild narrowing proximally. No high-grade stenosis. Narrowing and irregularity anterior inferior cerebellar arteries and superior cerebellar arteries bilaterally. Mild narrowing posterior cerebral artery proximally. Moderate narrowing distal branches bilaterally. Minimal bulge P1 segment left posterior cerebral artery without discrete saccular aneurysm. MRA NECK FINDINGS Common origin innominate and left common carotid artery. Slightly ectatic aortic arch. Ectatic common carotid arteries bilaterally. Markedly ectatic internal carotid arteries bilaterally. No evidence the hemodynamically significant stenosis involving either carotid bifurcation. Codominant vertebral arteries with mild ectasia without significant narrowing. IMPRESSION: MRI HEAD No acute infarct or intracranial hemorrhage. Minimal small vessel disease changes. 3 mm nodular enhancing lesion left internal auditory canal possibly a tiny vestibular schwannoma. Vessel is a secondary less likely consideration. When the patient has follow-up MR, this can be performed utilizing internal auditory canal protocol with thin-section imaging. Mild atrophy without  hydrocephalus. MRA HEAD No medium or large size vessel significant stenosis or occlusion. Moderate narrowing middle cerebral artery branches greater on the right. Nonvisualized right posterior inferior cerebellar artery. Narrowing and irregularity anterior inferior cerebellar arteries and superior cerebellar arteries bilaterally. Mild narrowing posterior cerebral artery proximally. Moderate narrowing distal branches bilaterally. Minimal bulge P1 segment left posterior cerebral artery without discrete saccular aneurysm. MRA NECK Ectatic common carotid arteries bilaterally. Markedly ectatic internal carotid arteries bilaterally. No evidence the hemodynamically significant stenosis involving either carotid bifurcation. Codominant vertebral arteries with mild ectasia without significant narrowing. Electronically Signed   By: Genia Del M.D.   On: 05/05/2015 20:08    Microbiology: No results found for this or any previous visit (from the past 240 hour(s)).   Labs: Basic Metabolic Panel:  Recent Labs Lab 05/05/15 1157 05/06/15 0457  NA 142 142  K 4.1 3.7  CL 109 108  CO2 27 27  GLUCOSE 126* 103*  BUN 14 13  CREATININE 0.89 1.05  CALCIUM 8.8* 8.8*   Liver Function Tests:  Recent Labs Lab 05/06/15 0457  AST 19  ALT 13*  ALKPHOS 37*  BILITOT 0.7  PROT 5.8*  ALBUMIN 3.2*   No results for input(s): LIPASE, AMYLASE in the last 168 hours. No results for input(s): AMMONIA in the last 168 hours. CBC:  Recent Labs Lab 05/05/15 1157 05/06/15 0457  WBC 2.3* 3.4*  HGB 11.5* 11.7*  HCT 35.1* 34.6*  MCV 85.2 84.4  PLT 151 147*   Cardiac Enzymes: No results for input(s): CKTOTAL, CKMB, CKMBINDEX, TROPONINI in the last 168 hours. BNP: BNP (last 3 results) No results for input(s): BNP in the last 8760 hours.  ProBNP (last 3 results) No results for input(s): PROBNP in the last 8760 hours.  CBG:  Recent Labs Lab 05/05/15 1938 05/06/15 0855 05/06/15 1147  GLUCAP 100* 103* 152*        Signed:  Kelvin Cellar MD.  [facility] 05/06/2015, 2:33 PM

## 2015-05-06 NOTE — Progress Notes (Signed)
OT Cancellation Note  Patient Details Name: Jonathan Lewis MRN: [id number] DOB: 10/16/1940   Cancelled Treatment:    Reason Eval/Treat Not Completed: OT screened, no needs identified, will sign off  [name] 05/06/2015, 1:11 PM

## 2015-05-06 NOTE — Progress Notes (Signed)
Pt given discharge instructions, prescriptions, and care notes. Pt verbalized understanding AEB no further questions or concerns at this time. IV was discontinued, no redness, pain, or swelling noted at this time. Telemetry discontinued and Centralized Telemetry was notified. Pt left the floor via wheelchair with staff in stable condition. 

## 2015-05-06 NOTE — Progress Notes (Signed)
SLP Cancellation Note  Patient Details Name: AMANDO MASSER MRN: [id number] DOB: February 09, 1941   Cancelled treatment:       Reason Eval/Treat Not Completed: SLP briefly screened patient.  MRI revealed no acute infarct or intracranial hemorrhage.  Speech-language appear intact, no needs identified, will sign off.    Gunnar Fusi, M.A., CCC-SLP [phone]  [place] 05/06/2015, 9:54 AM

## 2015-05-06 NOTE — Evaluation (Signed)
Physical Therapy Evaluation and Discharge Patient Details Name: Jonathan Lewis MRN: [id number] DOB: 24-Sep-1940 Today's Date: 05/06/2015   History of Present Illness  Jonathan Lewis is a 75 y.o. male with a h/o chronic asymptomatic sinus bradycardia who now presents with vertiginous spinning sensation. Hx of  HTN, DM II, and BPH  Clinical Impression  Patient evaluated by Physical Therapy with no further acute PT needs identified. All education has been completed and the patient has no further questions. Very typical description of BPPV although symptoms have resolved, this is not uncommon as otoliths can be repositioned without Epley's maneuver. Negative Dix-hallpike and lateral canal testing. No vestibular hypofunction noted. HR 56 at rest, up to 89 with gait. No loss of balance with dynamic gait tasks. See below for any follow-up Physical Therapy or equipment needs. PT is signing off. Thank you for this referral.     Follow Up Recommendations No PT follow up    Equipment Recommendations  None recommended by PT    Recommendations for Other Services       Precautions / Restrictions Precautions Precautions: None Restrictions Weight Bearing Restrictions: No      Mobility  Bed Mobility Overal bed mobility: Independent                Transfers Overall transfer level: Independent                  Ambulation/Gait Ambulation/Gait assistance: Modified independent (Device/Increase time) Ambulation Distance (Feet): 200 Feet Assistive device: None Gait Pattern/deviations: WFL(Within Functional Limits)   Gait velocity interpretation: at or above normal speed for age/gender General Gait Details: Tolerated high level dynamic gait tasks without loss of balance. Demonstrates mild difficulty maintaining straight path but is able to self correct during high marches, quick turns, vertical/horizontal head turns, and backwards stepping. HR up to 89 with gait, 50s at  rest.  Stairs            Wheelchair Mobility    Modified Rankin (Stroke Patients Only)       Balance Overall balance assessment: Independent                                           Pertinent Vitals/Pain Pain Assessment: No/denies pain    Home Living Family/patient expects to be discharged to:: Private residence Living Arrangements: Spouse/significant other Available Help at Discharge: Family;Available 24 hours/day Type of Home: House Home Access: Stairs to enter Entrance Stairs-Rails: Psychiatric nurse of Steps: 4   Home Equipment: None      Prior Function Level of Independence: Independent               Hand Dominance        Extremity/Trunk Assessment   Upper Extremity Assessment: Defer to OT evaluation           Lower Extremity Assessment: Overall WFL for tasks assessed         Communication   Communication: No difficulties  Cognition Arousal/Alertness: Awake/alert Behavior During Therapy: WFL for tasks assessed/performed Overall Cognitive Status: Within Functional Limits for tasks assessed                      General Comments General comments (skin integrity, edema, etc.): Symptoms described were consistent with BPPV (spinning sensation with variable head positions at home). Assess pt with Dix-Hallpike and lateral canal testing. Negative  with all positions. It is possible if the patient had BPPV that the otoliths were spontaneously repositioned, correcting his symptoms. No epley's performed.    Exercises        Assessment/Plan    PT Assessment Patent does not need any further PT services  PT Diagnosis Other (comment) (Admitted for dizziness)   PT Problem List    PT Treatment Interventions     PT Goals (Current goals can be found in the Care Plan section) Acute Rehab PT Goals Patient Stated Goal: none PT Goal Formulation: All assessment and education complete, DC therapy     Frequency     Barriers to discharge        Co-evaluation               End of Session   Activity Tolerance: Patient tolerated treatment well Patient left: in bed;with call bell/phone within reach;with family/visitor present (MD in room) Nurse Communication: Mobility status    Functional Assessment Tool Used: clinical observation Functional Limitation: Mobility: Walking and moving around Mobility: Walking and Moving Around Current Status [phone]): 0 percent impaired, limited or restricted Mobility: Walking and Moving Around Goal Status [phone]): 0 percent impaired, limited or restricted Mobility: Walking and Moving Around Discharge Status [phone]): 0 percent impaired, limited or restricted    Time: [id number] PT Time Calculation (min) (ACUTE ONLY): 22 min   Charges:   PT Evaluation $PT Eval Low Complexity: 1 Procedure     PT G Codes:   PT G-Codes **NOT FOR INPATIENT CLASS** Functional Assessment Tool Used: clinical observation Functional Limitation: Mobility: Walking and moving around Mobility: Walking and Moving Around Current Status [id number]): 0 percent impaired, limited or restricted Mobility: Walking and Moving Around Goal Status [id number]): 0 percent impaired, limited or restricted Mobility: Walking and Moving Around Discharge Status [phone]): 0 percent impaired, limited or restricted    Ellouise Newer 05/06/2015, 12:41 PM [place], [place]

## 2015-05-06 NOTE — Progress Notes (Signed)
  Echocardiogram 2D Echocardiogram has been performed.  Diamond Nickel 05/06/2015, 3:28 PM

## 2015-05-07 ENCOUNTER — Telehealth: Payer: Self-pay | Admitting: *Deleted

## 2015-05-07 LAB — HEMOGLOBIN A1C
Hgb A1c MFr Bld: 6.3 % — ABNORMAL HIGH (ref 4.8–5.6)
Mean Plasma Glucose: 134 mg/dL

## 2015-05-07 NOTE — Telephone Encounter (Signed)
Transition Care Management Follow-up Telephone Call   Date discharged? 05/06/15   How have you been since you were released from the hospital? Spoke with wife she states he is doing fine   Do you understand why you were in the hospital? YES   Do you understand the discharge instructions? YES   Where were you discharged to? Home   Items Reviewed:  Medications reviewed: YES  Allergies reviewed: YES  Dietary changes reviewed: YES  Referrals reviewed: No referral needed   Functional Questionnaire:   Activities of Daily Living (ADLs):   He states he are independent in the following: ambulation, bathing and hygiene, feeding, continence, grooming, toileting and dressing States he  require assistance with the following: ambulation sometimes   Any transportation issues/concerns?: NO   Any patient concerns? NO   Confirmed importance and date/time of follow-up visits scheduled YES, made appr 05/13/15  Provider Appointment booked with Dr. Jenny Reichmann  Confirmed with patient if condition begins to worsen call PCP or go to the ER.  Patient was given the office number and encouraged to call back with question or concerns.  : YES

## 2015-05-13 ENCOUNTER — Encounter: Payer: Self-pay | Admitting: Internal Medicine

## 2015-05-13 ENCOUNTER — Ambulatory Visit (INDEPENDENT_AMBULATORY_CARE_PROVIDER_SITE_OTHER): Payer: Medicare Other | Admitting: Internal Medicine

## 2015-05-13 VITALS — BP 126/80 | HR 55 | Temp 98.4°F | Resp 20 | Wt 204.0 lb

## 2015-05-13 DIAGNOSIS — Z0189 Encounter for other specified special examinations: Secondary | ICD-10-CM

## 2015-05-13 DIAGNOSIS — R9089 Other abnormal findings on diagnostic imaging of central nervous system: Secondary | ICD-10-CM | POA: Insufficient documentation

## 2015-05-13 DIAGNOSIS — K921 Melena: Secondary | ICD-10-CM | POA: Diagnosis not present

## 2015-05-13 DIAGNOSIS — R42 Dizziness and giddiness: Secondary | ICD-10-CM

## 2015-05-13 DIAGNOSIS — R930 Abnormal findings on diagnostic imaging of skull and head, not elsewhere classified: Secondary | ICD-10-CM

## 2015-05-13 DIAGNOSIS — E119 Type 2 diabetes mellitus without complications: Secondary | ICD-10-CM

## 2015-05-13 DIAGNOSIS — R001 Bradycardia, unspecified: Secondary | ICD-10-CM | POA: Diagnosis not present

## 2015-05-13 DIAGNOSIS — Z0000 Encounter for general adult medical examination without abnormal findings: Secondary | ICD-10-CM

## 2015-05-13 MED ORDER — MECLIZINE HCL 32 MG PO TABS: 32.0000 mg | ORAL_TABLET | Freq: Three times a day (TID) | ORAL | Status: DC | PRN

## 2015-05-13 NOTE — Assessment & Plan Note (Signed)
Some improved, exam benign, ok for meclizine prn to continue

## 2015-05-13 NOTE — Assessment & Plan Note (Signed)
stable overall by history and exam, recent data reviewed with pt, and pt to continue medical treatment as before,  to f/u any worsening symptoms or concerns Lab Results  Component Value Date   HGBA1C 6.3* 05/06/2015

## 2015-05-13 NOTE — Patient Instructions (Addendum)
Please continue all other medications as before, and refills have been done if requested - the meclizine for dizziness.  Please have the pharmacy call with any other refills you may need.  Please continue your efforts at being more active, low cholesterol diet, and weight control.  Please keep your appointments with your specialists as you may have planned  You will be contacted regarding the referral for: Brain MRI with attention to the internal auditory canal, as well as Gastroenterology  Please return in 6 months, or sooner if needed, with Lab testing done 3-5 days before

## 2015-05-13 NOTE — Progress Notes (Signed)
Subjective:    [name], male    DOB: [birth year], 75 y.o.   MRN: [id number]  HPI  Here to f/u recent hospn jan 31-feb1 with dizziness:  Dizziness. Described as room spinning. Acute onset. Slight improvement when patient is at rest. No change with head movement. Has not taken anything for his symptoms. Symptoms did not resolve he came to the emergency room for treatment and workup. Associated with nausea and near syncope. Denies headache, confusion, focal weakness or numbness, chest pain, abdominal pain, visual changes. Constant. Improving. He reported symptoms started acutely bent over to pick something up describing sensation of "room spinning" those associated with nausea and vomiting. Symptoms appear to be consistent with benign positional vertigo, however, he was found to be bradycardic on presentation. He was evaluated by Dr. Rayann Heman of cardiology who did not feel symptoms with cardiac origin and likely related to vertigo. He did not recommend further cardiac workup. Patient had an MRI/MRA of the brain which did not reveal evidence of CVA or significant stenosis. Radiology reported 3 mm nodular enhancing lesion in the left internal auditory canal that could represent a tiny vestibular schwannoma. Radiology recommended follow-up MRI, performing internal auditory canal protocol with thin section imaging. By the following day patient reported feeling much better with resolution to his symptoms. He was tolerating by mouth intake, denied nausea, vomiting, fevers or chills. He ambulated down the hallway without any issues. He was discharged home in stable condition on 05/06/2015. Suspect symptoms related to benign positional vertigo for which she was given a prescription for meclizine to be taken as needed.    Today states still some dizzy and feels funny in the legs but no frank vertigo.    Does have c/o small volume BRB on tissue paper this am x 1, no pain, last colonscopy 2012, due  now for f/u, Dr Sharlett Iles has now retired, has hx of polyps, hemorrhoids   Past Medical History  Diagnosis Date  . Diabetes mellitus     Type II  . Hypertension   . BPH (benign prostatic hyperplasia)   . Carbuncle     recurrent MRSA carbuncles  . Arthritis   . Cataract   . Glaucoma   . Hyperlipidemia   . Disk prolapse   . Sinusitis 07/29/2012  . Male hypogonadism 07/16/2014  . Allergic rhinitis 07/18/2014  . Sinus bradycardia     chronic, asymptomatic   Past Surgical History  Procedure Laterality Date  . Cataract extraction      x 2  . Colonoscopy    . Polypectomy      reports that he quit smoking about 36 years ago. He has never used smokeless tobacco. He reports that he drinks about 2.5 oz of alcohol per week. He reports that he does not use illicit drugs. family history includes Cancer in his brother and mother; Diabetes in his sister; Hyperlipidemia in his brother; Hypertension in his brother, mother, and sister; Stroke in his brother. There is no history of Colon cancer, Heart attack, or Sudden death. No Known Allergies Current Outpatient Prescriptions on File Prior to Visit  Medication Sig Dispense Refill  . aspirin 81 MG tablet Take 81 mg by mouth daily.     Marland Kitchen desloratadine (CLARINEX) 5 MG tablet Take 1 tablet (5 mg total) by mouth daily. 90 tablet 3  . diclofenac sodium (VOLTAREN) 1 % GEL Apply 2 g topically 4 (four) times daily. As needed for pain 100 g 5  .  fluticasone (FLONASE) 50 MCG/ACT nasal spray Place 2 sprays into both nostrils daily. 16 g 2  . glucose blood (ONE TOUCH TEST STRIPS) test strip 1 each by Other route as needed. Use as instructed to check blood sugar once daily (dx 250.00)    . lisinopril (PRINIVIL,ZESTRIL) 10 MG tablet TAKE 1 TABLET BY MOUTH EVERY DAY 30 tablet 5  . meclizine (ANTIVERT) 32 MG tablet Take 1 tablet (32 mg total) by mouth 3 (three) times daily as needed. 12 tablet 0  . metFORMIN (GLUCOPHAGE) 500 MG tablet Take 1 tablet (500 mg total)  by mouth 2 (two) times daily with a meal. 60 tablet 5  . ONE TOUCH LANCETS MISC by Does not apply route. Use to check blood sugar once daily (dx code 250.00)    . pravastatin (PRAVACHOL) 40 MG tablet TAKE 1 TABLET BY MOUTH EVERY DAY 30 tablet 4  . terazosin (HYTRIN) 2 MG capsule Take 1 capsule (2 mg total) by mouth once. (Patient taking differently: Take 2 mg by mouth daily. ) 30 capsule 5   No current facility-administered medications on file prior to visit.   Review of Systems  Constitutional: Negative for unusual diaphoresis or night sweats HENT: Negative for ringing in ear or discharge Eyes: Negative for double vision or worsening visual disturbance.  Respiratory: Negative for choking and stridor.   Gastrointestinal: Negative for vomiting or other signifcant bowel change Genitourinary: Negative for hematuria or change in urine volume.  Musculoskeletal: Negative for other MSK pain or swelling Skin: Negative for color change and worsening wound.  Neurological: Negative for tremors and numbness other than noted  Psychiatric/Behavioral: Negative for decreased concentration or agitation other than above       Objective:   Physical Exam BP 126/80 mmHg  Pulse 55  Temp(Src) 98.4 F (36.9 C) (Oral)  Resp 20  Wt 204 lb (92.534 kg)  SpO2 99% VS noted,  Constitutional: Pt appears in no significant distress HENT: Head: NCAT.  Right Ear: External ear normal.  Left Ear: External ear normal.  Eyes: . Pupils are equal, round, and reactive to light. Conjunctivae and EOM are normal Neck: Normal range of motion. Neck supple.  Cardiovascular: Normal rate and regular rhythm.   Pulmonary/Chest: Effort normal and breath sounds without rales or wheezing.  Abd:  Soft, NT, ND, + BS Neurological: Pt is alert. Not confused , motor grossly intact Skin: Skin is warm. No rash, no LE edema Psychiatric: Pt behavior is normal. No agitation.            [facility]*             [address] [place], [place] [address]              [phone]  ------------------------------------------------------------------- Transthoracic Echocardiography  Patient:  Jumaane, Demery MR #:    [id number] Study Date: 05/06/2015 Gender:   M Age:    46 Height:   172.7 cm Weight:   89.4 kg BSA:    2.1 m^2 Pt. Status: Room:    5W12C  SONOGRAPHER [place], [place], [place] PERFORMING  [facility], Inpatient  cc:  ------------------------------------------------------------------- LV EF: 55% -  60%  ------------------------------------------------------------------- Indications:   CVA 436.  ------------------------------------------------------------------- History:  PMH: Bradycardia. Risk factors: Hypertension. Diabetes mellitus. Dyslipidemia.  ------------------------------------------------------------------- Study Conclusions  - Left ventricle: The cavity size was normal. There was mild  focal basal hypertrophy of the septum. Systolic function was normal. The estimated ejection fraction was in the range of 55% to 60%. Wall motion was normal; there were no regional wall motion abnormalities. Doppler parameters are consistent with abnormal left ventricular relaxation (grade 1 diastolic dysfunction). - Right atrium: The atrium was mildly dilated.   CLINICAL DATA: 75 year old diabetic hypertensive male with hyperlipidemia presenting with vertigo. Bradycardia. Initial encounter.  EXAM: MRI HEAD WITHOUT CONTRAST  MRA HEAD WITHOUT CONTRAST  MRA NECK WITHOUT CONTRAST  TECHNIQUE: Multiplanar, multiecho pulse sequences of the brain and surrounding structures were obtained without intravenous contrast. Angiographic images of the Circle of Willis were obtained using MRA technique without intravenous contrast.  Angiographic images of the neck were obtained using MRA technique without intravenous contrast. Carotid stenosis measurements (when applicable) are obtained utilizing NASCET criteria, using the distal internal carotid diameter as the denominator.  COMPARISON: None.  FINDINGS: MRI HEAD FINDINGS  No acute infarct or intracranial hemorrhage.  Minimal small vessel disease changes.  3 mm nodular enhancing lesion left internal auditory canal possibly a tiny vestibular schwannoma. Vessel is a secondary less likely consideration. When the patient has follow-up MR, this can be performed utilizing internal auditory canal protocol with thin-section imaging.  Mild atrophy without hydrocephalus.  Decreased signal intensity of bone marrow may be related to patient's habitus. Correlation with CBC to exclude anemia contributing to this appearance may be considered  Cervical medullary junction and pituitary region unremarkable.  Mild congenitally narrowed upper cervical canal.  Post lens replacement. Minimal exophthalmos.   MRA HEAD May 05, 2015  No medium or large size vessel significant stenosis or occlusion.  Moderate narrowing middle cerebral artery branches greater on the right.  Nonvisualized right posterior inferior cerebellar artery.  Narrowing and irregularity anterior inferior cerebellar arteries and superior cerebellar arteries bilaterally.  Mild narrowing posterior cerebral artery proximally. Moderate narrowing distal branches bilaterally.  Minimal bulge P1 segment left posterior cerebral artery without discrete saccular aneurysm.  MRA NECK  Ectatic common carotid arteries bilaterally. Markedly ectatic internal carotid arteries bilaterally. No evidence the hemodynamically significant stenosis involving either carotid bifurcation.  Codominant vertebral arteries with mild ectasia without significant narrowing.   Also:  Lab Results    Component Value Date   WBC 3.4* 05/06/2015   HGB 11.7* 05/06/2015   HCT 34.6* 05/06/2015   PLT 147* 05/06/2015   GLUCOSE 103* 05/06/2015   CHOL 168 05/06/2015   TRIG 131 05/06/2015   HDL 34* 05/06/2015   LDLDIRECT 79.0 07/15/2014   LDLCALC 108* 05/06/2015   ALT 13* 05/06/2015   AST 19 05/06/2015   NA 142 05/06/2015   K 3.7 05/06/2015   CL 108 05/06/2015   CREATININE 1.05 05/06/2015   BUN 13 05/06/2015   CO2 27 05/06/2015   TSH 1.48 07/15/2014   PSA 1.60 07/15/2014   HGBA1C 6.3* 05/06/2015   MICROALBUR 1.2 07/15/2014       Assessment & Plan:

## 2015-05-13 NOTE — Assessment & Plan Note (Signed)
Will order repeat MRI with internal auditory canal protocol

## 2015-05-13 NOTE — Assessment & Plan Note (Signed)
stable overall by history and exam, recent data reviewed with pt, and pt to continue medical treatment as before,  to f/u any worsening symptoms or concerns; HR 45 in hosp, today 55

## 2015-05-13 NOTE — Progress Notes (Signed)
Pre visit review using our clinic review tool, if applicable. No additional management support is needed unless otherwise documented below in the visit note. 

## 2015-05-13 NOTE — Assessment & Plan Note (Signed)
etoilogy unclear, has hx of hemorrhoids, no diverticulosis on last colonscopy, but did have polyps as well and due for fu this yr incidentally, wil refer to new GI

## 2015-05-21 ENCOUNTER — Other Ambulatory Visit (INDEPENDENT_AMBULATORY_CARE_PROVIDER_SITE_OTHER): Payer: Medicare Other

## 2015-05-21 ENCOUNTER — Ambulatory Visit (INDEPENDENT_AMBULATORY_CARE_PROVIDER_SITE_OTHER): Payer: Medicare Other | Admitting: Gastroenterology

## 2015-05-21 ENCOUNTER — Encounter: Payer: Self-pay | Admitting: Gastroenterology

## 2015-05-21 VITALS — BP 138/88 | HR 60 | Ht 68.0 in | Wt 203.0 lb

## 2015-05-21 DIAGNOSIS — K625 Hemorrhage of anus and rectum: Secondary | ICD-10-CM

## 2015-05-21 DIAGNOSIS — D649 Anemia, unspecified: Secondary | ICD-10-CM | POA: Diagnosis not present

## 2015-05-21 DIAGNOSIS — K649 Unspecified hemorrhoids: Secondary | ICD-10-CM

## 2015-05-21 DIAGNOSIS — Z86010 Personal history of colonic polyps: Secondary | ICD-10-CM

## 2015-05-21 LAB — FERRITIN: FERRITIN: 23.2 ng/mL (ref 22.0–322.0)

## 2015-05-21 LAB — IBC PANEL
Iron: 99 ug/dL (ref 42–165)
SATURATION RATIOS: 23.4 % (ref 20.0–50.0)
TRANSFERRIN: 302 mg/dL (ref 212.0–360.0)

## 2015-05-21 LAB — FOLATE: FOLATE: 9.6 ng/mL (ref >5.9)

## 2015-05-21 LAB — VITAMIN B12: Vitamin B-12: 295 pg/mL (ref 211–911)

## 2015-05-21 MED ORDER — NA SULFATE-K SULFATE-MG SULF 17.5-3.13-1.6 GM/177ML PO SOLN: 1.0000 | Freq: Once | ORAL | Status: DC

## 2015-05-21 NOTE — Patient Instructions (Addendum)
You have been scheduled for a colonoscopy. Please follow written instructions given to you at your visit today.  Please pick up your prep supplies at the pharmacy within the next 1-3 days. If you use inhalers (even only as needed), please bring them with you on the day of your procedure. Your physician has requested that you go to www.[email or web address].[email or web address] and enter the access code given to you at your visit today. This web site gives a general overview about your procedure. However, you should still follow specific instructions given to you by our office regarding your preparation for the procedure.   We have sent the following medications to your pharmacy for you to pick up at your convenience: [facility] has requested that you go to the basement for  lab work before leaving today.  Please start using [employer] daily.

## 2015-05-21 NOTE — Progress Notes (Signed)
HPI :  75 y/o male, former patient of Dr. Sharlett Iles, here with symptoms of rectal bleeding:  He reports he had one episode of rectal bleeding which occurred about 2 weeks ago. He reported he passed some stool and had a large amount of blood on the toilet paper when wiping himself, no blood on the toilet or in the stool. No further episodes since this one time. He denied any rectal pain when he saw the blood. He has has not much bleeding prior to this. He reports he has variable bowel movements and felt constipated recently, he has one BM every few days, sometimes only able to pass gas. He does endorse some straining with bowel movements. He is passing hard stools occasionally. No abdominal pains. Weight has been stable, he has been trying to lose a few pounds.  He denies any fiber supplements or any other supplements. No FH of colon cancer. He otherwise has a history of DM and labs shows a history of anemia with Hgb of 11-12s with normal MCV. He is not aware of a prior history of iron deficiency.  Colonoscopy 2012 - 3 small adenomas   Past Medical History  Diagnosis Date  . Diabetes mellitus     Type II  . Hypertension   . BPH (benign prostatic hyperplasia)   . Carbuncle     recurrent MRSA carbuncles  . Arthritis   . Cataract   . Glaucoma   . Hyperlipidemia   . Disk prolapse   . Sinusitis 07/29/2012  . Male hypogonadism 07/16/2014  . Allergic rhinitis 07/18/2014  . Sinus bradycardia     chronic, asymptomatic     Past Surgical History  Procedure Laterality Date  . Cataract extraction      x 2  . Colonoscopy    . Polypectomy     Family History  Problem Relation Age of Onset  . Cancer Mother   . Hypertension Mother   . Colon cancer Neg Hx   . Heart attack Neg Hx   . Sudden death Neg Hx   . Diabetes Sister   . Cancer Brother     colon  . Hypertension Brother   . Hyperlipidemia Brother   . Stroke Brother   . Hypertension Sister    Social History  Substance Use Topics    . Smoking status: Former Smoker    Quit date: 10/03/1978  . Smokeless tobacco: Never Used  . Alcohol Use: 2.5 oz/week    5 drink(s) per week     Comment: pint, occasion beer   Current Outpatient Prescriptions  Medication Sig Dispense Refill  . aspirin 81 MG tablet Take 81 mg by mouth daily.     . fluticasone (FLONASE) 50 MCG/ACT nasal spray Place 2 sprays into both nostrils daily. 16 g 2  . glucose blood (ONE TOUCH TEST STRIPS) test strip 1 each by Other route as needed. Use as instructed to check blood sugar once daily (dx 250.00)    . lisinopril (PRINIVIL,ZESTRIL) 10 MG tablet TAKE 1 TABLET BY MOUTH EVERY DAY 30 tablet 5  . meclizine (ANTIVERT) 32 MG tablet Take 1 tablet (32 mg total) by mouth 3 (three) times daily as needed. 30 tablet 2  . metFORMIN (GLUCOPHAGE) 500 MG tablet Take 1 tablet (500 mg total) by mouth 2 (two) times daily with a meal. 60 tablet 5  . ONE TOUCH LANCETS MISC by Does not apply route. Use to check blood sugar once daily (dx code 250.00)    .  pravastatin (PRAVACHOL) 40 MG tablet TAKE 1 TABLET BY MOUTH EVERY DAY 30 tablet 4  . terazosin (HYTRIN) 2 MG capsule Take 1 capsule (2 mg total) by mouth once. (Patient taking differently: Take 2 mg by mouth daily. ) 30 capsule 5   No current facility-administered medications for this visit.   No Known Allergies   Review of Systems: All systems reviewed and negative except where noted in HPI.   Lab Results  Component Value Date   WBC 3.4* 05/06/2015   HGB 11.7* 05/06/2015   HCT 34.6* 05/06/2015   MCV 84.4 05/06/2015   PLT 147* 05/06/2015    Lab Results  Component Value Date   CREATININE 1.05 05/06/2015   BUN 13 05/06/2015   NA 142 05/06/2015   K 3.7 05/06/2015   CL 108 05/06/2015   CO2 27 05/06/2015    Lab Results  Component Value Date   ALT 13* 05/06/2015   AST 19 05/06/2015   ALKPHOS 37* 05/06/2015   BILITOT 0.7 05/06/2015     Physical Exam: BP 138/88 mmHg  Pulse 60  Ht 5\' 8"  (1.727 m)  Wt  203 lb (92.08 kg)  BMI 30.87 kg/m2 Constitutional: Pleasant,well-developed, male in no acute distress. HEENT: Normocephalic and atraumatic. Conjunctivae are normal. No scleral icterus. Neck supple.  Cardiovascular: Normal rate, regular rhythm.  Pulmonary/chest: Effort normal and breath sounds normal. No wheezing, rales or rhonchi. Abdominal: Soft, nondistended, nontender. Bowel sounds active throughout. There are no masses palpable. No hepatomegaly.  DRE / Anoscopy - external skin tag, normal DRE, anoscopy showed internal hemorrhoids at RA and RP position. No fissure appreciated Extremities: no edema Lymphadenopathy: No cervical adenopathy noted. Neurological: Alert and oriented to person place and time. Skin: Skin is warm and dry. No rashes noted. Psychiatric: Normal mood and affect. Behavior is normal.   ASSESSMENT AND PLAN: 75 y/o male here for recent episode of rectal bleeding in the setting of constipation. He also has a history of a few small adenomas in 2012. On review of labs he has a normocytic anemia which appears chronic.   I suspect he had hemorrhoidal bleeding in the setting of constipation. Anoscopy shows internal hemorrhoids and no evidence of fissure. Recommend he take a daily fiber supplement for his constipation and hemorrhoids and see if this helps, recommended Citrucel to minimize gas / bloating. If this does not help he can try Miralax or call me to discuss other options. Otherwise recommend surveillance colonoscopy given his history of adenomas 5 years ago and these ongoing symptoms. For his anemia, appears chronic but will send iron studies and B12/folate to ensure normal. He agreed.   The indications, risks, and benefits of colonoscopy were explained to the patient in detail. Risks include but are not limited to bleeding, perforation, adverse reaction to medications, and cardiopulmonary compromise. Sequelae include but are not limited to the possibility of surgery,  hospitalization, and mortality. The patient verbalized understanding and wished to proceed. All questions answered, referred to the scheduler and bowel prep ordered. Further recommendations pending results of the exam.   Lilbourn Cellar, MD [facility] Pager [phone]

## 2015-06-03 ENCOUNTER — Other Ambulatory Visit: Payer: Self-pay | Admitting: Internal Medicine

## 2015-06-03 DIAGNOSIS — R930 Abnormal findings on diagnostic imaging of skull and head, not elsewhere classified: Secondary | ICD-10-CM

## 2015-06-05 ENCOUNTER — Telehealth: Payer: Self-pay | Admitting: Internal Medicine

## 2015-06-05 DIAGNOSIS — R42 Dizziness and giddiness: Secondary | ICD-10-CM

## 2015-06-05 NOTE — Telephone Encounter (Signed)
Please enter new order for MRI brain w & w/o contrast. Needs internal auditory canal protocol. Thank you!

## 2015-06-06 ENCOUNTER — Other Ambulatory Visit: Payer: Medicare Other

## 2015-06-08 NOTE — Telephone Encounter (Signed)
This was already attempted mar 1  There is no specific order in the EMR to include the verbage "internal auditory protocol" I believe I added it on the original order mar 1 in the comment section  I do not know how to remedy this issue further. thanks

## 2015-06-12 NOTE — Telephone Encounter (Signed)
I tried to order again

## 2015-06-12 NOTE — Telephone Encounter (Signed)
Jonelle Sidle entered the order for MRI brain w & w/o contrast as requested by [facility], and someone at the imaging facility canceled it (for reasons that are unknown).   They need new order for MRI brain w & w/o contrast and in the comment section type "internal auditory protocol."

## 2015-06-24 ENCOUNTER — Ambulatory Visit
Admission: RE | Admit: 2015-06-24 | Discharge: 2015-06-24 | Disposition: A | Discharge status: Home / Self Care | Payer: Medicare Other | Source: Ambulatory Visit | Attending: Internal Medicine | Admitting: Internal Medicine

## 2015-06-24 DIAGNOSIS — R42 Dizziness and giddiness: Secondary | ICD-10-CM

## 2015-06-24 MED ORDER — GADOBENATE DIMEGLUMINE 529 MG/ML IV SOLN
15.0000 mL | Freq: Once | INTRAVENOUS | Status: AC | PRN
  Administered 2015-06-24: 15 mL via INTRAVENOUS

## 2015-06-25 ENCOUNTER — Encounter: Payer: Self-pay | Admitting: Internal Medicine

## 2015-06-25 ENCOUNTER — Other Ambulatory Visit: Payer: Self-pay | Admitting: Internal Medicine

## 2015-06-25 DIAGNOSIS — D333 Benign neoplasm of cranial nerves: Secondary | ICD-10-CM

## 2015-06-25 HISTORY — DX: Benign neoplasm of cranial nerves: D33.3

## 2015-06-29 ENCOUNTER — Encounter: Payer: Self-pay | Admitting: Gastroenterology

## 2015-06-29 ENCOUNTER — Ambulatory Visit (AMBULATORY_SURGERY_CENTER): Payer: Medicare Other | Admitting: Gastroenterology

## 2015-06-29 VITALS — BP 106/69 | HR 49 | Temp 98.0°F | Resp 14 | Ht 68.0 in | Wt 203.0 lb

## 2015-06-29 DIAGNOSIS — D123 Benign neoplasm of transverse colon: Secondary | ICD-10-CM | POA: Diagnosis not present

## 2015-06-29 DIAGNOSIS — Z86010 Personal history of colonic polyps: Secondary | ICD-10-CM

## 2015-06-29 LAB — GLUCOSE, CAPILLARY
GLUCOSE-CAPILLARY: 95 mg/dL (ref 65–99)
Glucose-Capillary: 80 mg/dL (ref 65–99)

## 2015-06-29 MED ORDER — SODIUM CHLORIDE 0.9 % IV SOLN: 500.0000 mL | INTRAVENOUS | Status: DC

## 2015-06-29 NOTE — Op Note (Signed)
[name] Patient Name: Jonathan Lewis Procedure Date: 06/29/2015 8:33 AM MRN: [id number] Endoscopist: Remo Lipps P. Havery Moros , MD Age: 75 Referring MD:  Date of Birth: Jun 29, 1940 Gender: Male Procedure:                Colonoscopy Indications:              High risk colon cancer surveillance: Personal                            history of non-advanced adenoma, history of                            hematochezia Medicines:                Monitored Anesthesia Care Procedure:                Pre-Anesthesia Assessment:                           - Prior to the procedure, a History and Physical                            was performed, and patient medications and                            allergies were reviewed. The patient's tolerance of                            previous anesthesia was also reviewed. The risks                            and benefits of the procedure and the sedation                            options and risks were discussed with the patient.                            All questions were answered, and informed consent                            was obtained. Prior Anticoagulants: The patient has                            taken aspirin, last dose was 1 day prior to                            procedure. ASA Grade Assessment: III - A patient                            with severe systemic disease. After reviewing the                            risks and benefits, the patient was deemed in  satisfactory condition to undergo the procedure.                           After obtaining informed consent, the colonoscope                            was passed under direct vision. Throughout the                            procedure, the patient's blood pressure, pulse, and                            oxygen saturations were monitored continuously. The                            Model CF-HQ190L [id number]) scope was introduced                             through the anus and advanced to the the terminal                            ileum, with identification of the appendiceal                            orifice and IC valve. The colonoscopy was performed                            without difficulty. The patient tolerated the                            procedure well. The quality of the bowel                            preparation was adequate. The terminal ileum,                            ileocecal valve, appendiceal orifice, and rectum                            were photographed. Scope In: 8:35:59 AM Scope Out: 8:54:46 AM Scope Withdrawal Time: 0 hours 15 minutes 4 seconds  Total Procedure Duration: 0 hours 18 minutes 47 seconds  Findings:      The perianal exam findings include non-thrombosed external hemorrhoids.      Non-bleeding internal hemorrhoids were found during retroflexion.      Scattered medium-mouthed diverticula were found in the left colon.      Two sessile polyps were found in the transverse colon. The polyps were 3       to 4 mm in size. These polyps were removed with a cold snare. Resection       and retrieval were complete.      The terminal ileum appeared normal.      The exam was otherwise without abnormality. Complications:            No immediate complications. Estimated blood loss:  Minimal. Estimated Blood Loss:     Estimated blood loss was minimal. Impression:               - Non-thrombosed external hemorrhoids found on                            perianal exam.                           - Non-bleeding internal hemorrhoids.                           - Diverticulosis in the left colon.                           - Two 3 to 4 mm polyps in the transverse colon,                            removed with a cold snare. Resected and retrieved.                           - The examined portion of the ileum was normal.                           - The examination was otherwise  normal. Recommendation:           - Patient has a contact number available for                            emergencies. The signs and symptoms of potential                            delayed complications were discussed with the                            patient. Return to normal activities tomorrow.                            Written discharge instructions were provided to the                            patient.                           - Resume previous diet.                           - Daily fiber supplement for hemorrhoids                           - Continue present medications.                           - Await pathology results.                           - Repeat colonoscopy is recommended for  surveillance. The colonoscopy date will be                            determined after pathology results from today's                            exam become available for review. Procedure Code(s):        --- Professional ---                           [id number], Colonoscopy, flexible; with removal of                            tumor(s), polyp(s), or other lesion(s) by snare                            technique CPT copyright 2016 [employer]. All rights reserved. Remo Lipps P. Armbruster, MD 06/29/2015 8:58:40 AM This report has been signed electronically. Number of Addenda: 0 Referring MD:      Biagio Borg

## 2015-06-29 NOTE — Patient Instructions (Signed)
YOU HAD AN ENDOSCOPIC PROCEDURE TODAY AT [facility]:   Refer to the procedure report that was given to you for any specific questions about what was found during the examination.  If the procedure report does not answer your questions, please call your gastroenterologist to clarify.  If you requested that your care partner not be given the details of your procedure findings, then the procedure report has been included in a sealed envelope for you to review at your convenience later.  YOU SHOULD EXPECT: Some feelings of bloating in the abdomen. Passage of more gas than usual.  Walking can help get rid of the air that was put into your GI tract during the procedure and reduce the bloating. If you had a lower endoscopy (such as a colonoscopy or flexible sigmoidoscopy) you may notice spotting of blood in your stool or on the toilet paper. If you underwent a bowel prep for your procedure, you may not have a normal bowel movement for a few days.  Please Note:  You might notice some irritation and congestion in your nose or some drainage.  This is from the oxygen used during your procedure.  There is no need for concern and it should clear up in a day or so.  SYMPTOMS TO REPORT IMMEDIATELY:   Following lower endoscopy (colonoscopy or flexible sigmoidoscopy):  Excessive amounts of blood in the stool  Significant tenderness or worsening of abdominal pains  Swelling of the abdomen that is new, acute  Fever of 100F or higher    For urgent or emergent issues, a gastroenterologist can be reached at any hour by calling [phone].   DIET: Your first meal following the procedure should be a small meal and then it is ok to progress to your normal diet. Heavy or fried foods are harder to digest and may make you feel nauseous or bloated.  Likewise, meals heavy in dairy and vegetables can increase bloating.  Drink plenty of fluids but you should avoid alcoholic beverages for 24  hours.  ACTIVITY:  You should plan to take it easy for the rest of today and you should NOT DRIVE or use heavy machinery until tomorrow (because of the sedation medicines used during the test).    FOLLOW UP: Our staff will call the number listed on your records the next business day following your procedure to check on you and address any questions or concerns that you may have regarding the information given to you following your procedure. If we do not reach you, we will leave a message.  However, if you are feeling well and you are not experiencing any problems, there is no need to return our call.  We will assume that you have returned to your regular daily activities without incident.  If any biopsies were taken you will be contacted by phone or by letter within the next 1-3 weeks.  Please call us at [phone] if you have not heard about the biopsies in 3 weeks.    SIGNATURES/CONFIDENTIALITY: You and/or your care partner have signed paperwork which will be entered into your electronic medical record.  These signatures attest to the fact that that the information above on your After Visit Summary has been reviewed and is understood.  Full responsibility of the confidentiality of this discharge information lies with you and/or your care-partner   Information on polyps,diverticulosis,& high fiber given to you today.  Daily fiber supplement for hemorrhoids

## 2015-06-29 NOTE — Progress Notes (Signed)
Called to room to assist during endoscopic procedure.  Patient ID and intended procedure confirmed with present staff. Received instructions for my participation in the procedure from the performing physician.  

## 2015-06-29 NOTE — Progress Notes (Signed)
A/ox3 pleased with MAC, report to Penny RN 

## 2015-06-30 ENCOUNTER — Telehealth: Payer: Self-pay | Admitting: *Deleted

## 2015-06-30 NOTE — Telephone Encounter (Signed)
  Follow up Call-  Call back number 06/29/2015  Post procedure Call Back phone  # [phone]  Permission to leave phone message Yes     Patient questions:  Do you have a fever, pain , or abdominal swelling? No. Pain Score  0 *  Have you tolerated food without any problems? Yes.    Have you been able to return to your normal activities? Yes.    Do you have any questions about your discharge instructions: Diet   No. Medications  No. Follow up visit  No.  Do you have questions or concerns about your Care? No.  Actions: * If pain score is 4 or above: No action needed, pain <4.

## 2015-07-03 ENCOUNTER — Encounter: Payer: Self-pay | Admitting: Gastroenterology

## 2015-07-23 ENCOUNTER — Ambulatory Visit: Payer: Medicare PPO | Admitting: Internal Medicine

## 2015-07-27 DIAGNOSIS — H9313 Tinnitus, bilateral: Secondary | ICD-10-CM | POA: Insufficient documentation

## 2015-07-27 DIAGNOSIS — H903 Sensorineural hearing loss, bilateral: Secondary | ICD-10-CM | POA: Insufficient documentation

## 2015-09-29 ENCOUNTER — Other Ambulatory Visit: Payer: Self-pay | Admitting: Internal Medicine

## 2015-10-25 ENCOUNTER — Other Ambulatory Visit: Payer: Self-pay | Admitting: Internal Medicine

## 2015-11-09 ENCOUNTER — Other Ambulatory Visit (INDEPENDENT_AMBULATORY_CARE_PROVIDER_SITE_OTHER): Payer: Medicare Other

## 2015-11-09 DIAGNOSIS — Z0189 Encounter for other specified special examinations: Secondary | ICD-10-CM | POA: Diagnosis not present

## 2015-11-09 DIAGNOSIS — E119 Type 2 diabetes mellitus without complications: Secondary | ICD-10-CM | POA: Diagnosis not present

## 2015-11-09 DIAGNOSIS — Z0000 Encounter for general adult medical examination without abnormal findings: Secondary | ICD-10-CM

## 2015-11-09 LAB — HEPATIC FUNCTION PANEL
ALBUMIN: 3.8 g/dL (ref 3.5–5.2)
ALK PHOS: 36 U/L — AB (ref 39–117)
ALT: 12 U/L (ref 0–53)
AST: 16 U/L (ref 0–37)
Bilirubin, Direct: 0.1 mg/dL (ref 0.0–0.3)
TOTAL PROTEIN: 6.4 g/dL (ref 6.0–8.3)
Total Bilirubin: 0.6 mg/dL (ref 0.2–1.2)

## 2015-11-09 LAB — CBC WITH DIFFERENTIAL/PLATELET
Basophils Absolute: 0 10*3/uL (ref 0.0–0.1)
Basophils Relative: 0.7 % (ref 0.0–3.0)
Eosinophils Absolute: 0 10*3/uL (ref 0.0–0.7)
Eosinophils Relative: 1.1 % (ref 0.0–5.0)
HCT: 36.4 % — ABNORMAL LOW (ref 39.0–52.0)
Hemoglobin: 12.1 g/dL — ABNORMAL LOW (ref 13.0–17.0)
LYMPHS ABS: 1.4 10*3/uL (ref 0.7–4.0)
Lymphocytes Relative: 50.3 % — ABNORMAL HIGH (ref 12.0–46.0)
MCHC: 33.2 g/dL (ref 30.0–36.0)
MCV: 85.5 fl (ref 78.0–100.0)
MONO ABS: 0.4 10*3/uL (ref 0.1–1.0)
Monocytes Relative: 12.7 % — ABNORMAL HIGH (ref 3.0–12.0)
NEUTROS PCT: 35.2 % — AB (ref 43.0–77.0)
Neutro Abs: 1 10*3/uL — ABNORMAL LOW (ref 1.4–7.7)
Platelets: 163 10*3/uL (ref 150.0–400.0)
RBC: 4.26 Mil/uL (ref 4.22–5.81)
RDW: 12.9 % (ref 11.5–15.5)
WBC: 2.9 10*3/uL — ABNORMAL LOW (ref 4.0–10.5)

## 2015-11-09 LAB — BASIC METABOLIC PANEL
BUN: 14 mg/dL (ref 6–23)
CALCIUM: 8.8 mg/dL (ref 8.4–10.5)
CO2: 30 meq/L (ref 19–32)
CREATININE: 1.04 mg/dL (ref 0.40–1.50)
Chloride: 109 mEq/L (ref 96–112)
GFR: 89.22 mL/min (ref >60.00)
GLUCOSE: 117 mg/dL — AB (ref 70–99)
Potassium: 4.4 mEq/L (ref 3.5–5.1)
Sodium: 142 mEq/L (ref 135–145)

## 2015-11-09 LAB — PSA: PSA: 1.91 ng/mL (ref 0.10–4.00)

## 2015-11-09 LAB — URINALYSIS, ROUTINE W REFLEX MICROSCOPIC
Bilirubin Urine: NEGATIVE
Hgb urine dipstick: NEGATIVE
KETONES UR: NEGATIVE
LEUKOCYTES UA: NEGATIVE
Nitrite: NEGATIVE
PH: 6 (ref 5.0–8.0)
RBC / HPF: NONE SEEN (ref 0–?)
SPECIFIC GRAVITY, URINE: 1.02 (ref 1.000–1.030)
Total Protein, Urine: NEGATIVE
URINE GLUCOSE: NEGATIVE
UROBILINOGEN UA: 1 (ref 0.0–1.0)
WBC, UA: NONE SEEN (ref 0–?)

## 2015-11-09 LAB — LIPID PANEL
Cholesterol: 142 mg/dL (ref 0–200)
HDL: 35.5 mg/dL — ABNORMAL LOW (ref >39.00)
LDL CALC: 88 mg/dL (ref 0–99)
NONHDL: 106.09
Total CHOL/HDL Ratio: 4
Triglycerides: 92 mg/dL (ref 0.0–149.0)
VLDL: 18.4 mg/dL (ref 0.0–40.0)

## 2015-11-09 LAB — MICROALBUMIN / CREATININE URINE RATIO
Creatinine,U: 141.9 mg/dL
Microalb Creat Ratio: 0.5 mg/g (ref 0.0–30.0)

## 2015-11-09 LAB — TSH: TSH: 1.32 u[IU]/mL (ref 0.35–4.50)

## 2015-11-09 LAB — HEMOGLOBIN A1C: Hgb A1c MFr Bld: 6.2 % (ref 4.6–6.5)

## 2015-11-10 ENCOUNTER — Encounter: Payer: Self-pay | Admitting: Internal Medicine

## 2015-11-10 ENCOUNTER — Telehealth: Payer: Self-pay

## 2015-11-10 ENCOUNTER — Ambulatory Visit (INDEPENDENT_AMBULATORY_CARE_PROVIDER_SITE_OTHER): Payer: Medicare Other | Admitting: Internal Medicine

## 2015-11-10 VITALS — BP 128/74 | HR 54 | Temp 98.0°F | Resp 20 | Wt 203.0 lb

## 2015-11-10 DIAGNOSIS — R6889 Other general symptoms and signs: Secondary | ICD-10-CM

## 2015-11-10 DIAGNOSIS — Z0001 Encounter for general adult medical examination with abnormal findings: Secondary | ICD-10-CM

## 2015-11-10 DIAGNOSIS — E119 Type 2 diabetes mellitus without complications: Secondary | ICD-10-CM | POA: Diagnosis not present

## 2015-11-10 DIAGNOSIS — M19042 Primary osteoarthritis, left hand: Secondary | ICD-10-CM

## 2015-11-10 DIAGNOSIS — R202 Paresthesia of skin: Secondary | ICD-10-CM | POA: Diagnosis not present

## 2015-11-10 DIAGNOSIS — M5431 Sciatica, right side: Secondary | ICD-10-CM | POA: Diagnosis not present

## 2015-11-10 DIAGNOSIS — K5900 Constipation, unspecified: Secondary | ICD-10-CM

## 2015-11-10 MED ORDER — GLUCOSE BLOOD VI STRP: ORAL_STRIP | 11 refills | Status: DC

## 2015-11-10 MED ORDER — ONETOUCH LANCETS MISC: 11 refills | Status: DC

## 2015-11-10 MED ORDER — DICLOFENAC SODIUM 1 % TD GEL: 4.0000 g | Freq: Four times a day (QID) | TRANSDERMAL | 11 refills | Status: DC | PRN

## 2015-11-10 MED ORDER — GABAPENTIN 100 MG PO CAPS: 100.0000 mg | ORAL_CAPSULE | Freq: Every day | ORAL | 1 refills | Status: DC

## 2015-11-10 NOTE — Patient Instructions (Addendum)
OK to tthe gabapentin at 100 mg at bedtime  Please also start OTC Miralax as directed   Please take all new medication as prescribed - the voltaren gel for the hand pain  Please continue all other medications as before, and refills have been done if requested.  Please have the pharmacy call with any other refills you may need.  Please continue your efforts at being more active, low cholesterol diet, and weight control.  You are otherwise up to date with prevention measures today.  Please keep your appointments with your specialists as you may have planned  Please return in 6 months, or sooner if needed, with Lab testing done 3-5 days before

## 2015-11-10 NOTE — Telephone Encounter (Signed)
PA initiated via CoverMyMeds key [id number]

## 2015-11-10 NOTE — Progress Notes (Signed)
Pre visit review using our clinic review tool, if applicable. No additional management support is needed unless otherwise documented below in the visit note. 

## 2015-11-10 NOTE — Progress Notes (Signed)
Subjective:    [name], male    DOB: 16-Jul-1940, 75 y.o.   MRN: [id number]  HPI  Here for wellness and f/u;  Overall doing ok;  Pt denies Chest pain, worsening SOB, DOE, wheezing, orthopnea, PND, worsening LE edema, palpitations, dizziness or syncope.  Pt denies neurological change such as new headache, facial or extremity weakness.  Pt denies polydipsia, polyuria, or low sugar symptoms. Pt states overall good compliance with treatment and medications, good tolerability, and has been trying to follow appropriate diet.  Pt denies worsening depressive symptoms, suicidal ideation or panic. No fever, night sweats, wt loss, loss of appetite, or other constitutional symptoms.  Pt states good ability with ADL's, has low fall risk, home safety reviewed and adequate, no other significant changes in hearing or vision, and only occasionally active with exercise.  Denies worsening reflux, abd pain, dysphagia, n/v, bowel change or blood, except for 2-3 mo worsening intermittent constipation despite daily metamucil.  Also c/o mod to severe intermittent sharp > 6 mo worsening pain to the left hand third MCP specifically, worse to overuse, better to rest, but hurts at least mildly every day.  No soft tissue swelling or hx of gout.  Pt continues to have recurring LBP without change in severity, bowel or bladder change, fever, wt loss,  worsening LE pain/numbness/weakness, gait change or falls, except for recurring right sciatica like burning pain on the right to the lateral right hip.  Does also have paresthesias randomly to different areas of the extremities as well, without pain or weakness. Past Medical History:  Diagnosis Date  . Acoustic neuroma ([place]) 06/25/2015  . Allergic rhinitis 07/18/2014  . Arthritis   . BPH (benign prostatic hyperplasia)   . Carbuncle    recurrent MRSA carbuncles  . Cataract   . Diabetes mellitus    Type II  . Disk prolapse   . Glaucoma   . Hyperlipidemia   .  Hypertension   . Male hypogonadism 07/16/2014  . Sinus bradycardia    chronic, asymptomatic  . Sinusitis 07/29/2012   Past Surgical History:  Procedure Laterality Date  . CATARACT EXTRACTION     x 2  . COLONOSCOPY    . POLYPECTOMY      reports that he quit smoking about 37 years ago. He has never used smokeless tobacco. He reports that he drinks about 2.5 oz of alcohol per week . He reports that he does not use drugs. family history includes Cancer in his brother and mother; Diabetes in his sister; Hyperlipidemia in his brother; Hypertension in his brother, mother, and sister; Stroke in his brother. No Known Allergies Current Outpatient Prescriptions on File Prior to Visit  Medication Sig Dispense Refill  . aspirin 81 MG tablet Take 81 mg by mouth daily.     . fluticasone (FLONASE) 50 MCG/ACT nasal spray Place 2 sprays into both nostrils daily. 16 g 2  . lisinopril (PRINIVIL,ZESTRIL) 10 MG tablet TAKE 1 TABLET BY MOUTH EVERY DAY 30 tablet 5  . meclizine (ANTIVERT) 32 MG tablet Take 1 tablet (32 mg total) by mouth 3 (three) times daily as needed. 30 tablet 2  . metFORMIN (GLUCOPHAGE) 500 MG tablet Take 1 tablet (500 mg total) by mouth 2 (two) times daily with a meal. 60 tablet 5  . pravastatin (PRAVACHOL) 40 MG tablet TAKE 1 TABLET BY MOUTH EVERY DAY 30 tablet 4  . terazosin (HYTRIN) 2 MG capsule TAKE ONE CAPSULE BY MOUTH EVERY DAY 30 capsule  5   No current facility-administered medications on file prior to visit.    Review of Systems Constitutional: Negative for increased diaphoresis, or other activity, appetite or siginficant weight change other than noted HENT: Negative for worsening hearing loss, ear pain, facial swelling, mouth sores and neck stiffness.   Eyes: Negative for other worsening pain, redness or visual disturbance.  Respiratory: Negative for choking or stridor Cardiovascular: Negative for other chest pain and palpitations.  Gastrointestinal: Negative for worsening  diarrhea, blood in stool, or abdominal distention Genitourinary: Negative for hematuria, flank pain or change in urine volume.  Musculoskeletal: Negative for myalgias or other joint complaints.  Skin: Negative for other color change and wound or drainage.  Neurological: Negative for syncope and numbness. other than noted Hematological: Negative for adenopathy. or other swelling Psychiatric/Behavioral: Negative for hallucinations, SI, self-injury, decreased concentration or other worsening agitation.      Objective:   Physical Exam BP 128/74   Pulse (!) 54   Temp 98 F (36.7 C) (Oral)   Resp 20   Wt 203 lb (92.1 kg)   SpO2 99%   BMI 30.87 kg/m  VS noted,  Constitutional: Pt is oriented to person, place, and time. Appears well-developed and well-nourished, in no significant distress Head: Normocephalic and atraumatic  Eyes: Conjunctivae and EOM are normal. Pupils are equal, round, and reactive to light Right Ear: External ear normal.  Left Ear: External ear normal Nose: Nose normal.  Mouth/Throat: Oropharynx is clear and moist  Neck: Normal range of motion. Neck supple. No JVD present. No tracheal deviation present or significant neck LA or mass Cardiovascular: Normal rate, regular rhythm, normal heart sounds and intact distal pulses.   Pulmonary/Chest: Effort normal and breath sounds without rales or wheezing  Abdominal: Soft. Bowel sounds are normal. NT. No HSM  Musculoskeletal: Normal range of motion. Exhibits no edema Lymphadenopathy: Has no cervical adenopathy.  Neurological: Pt is alert and oriented to person, place, and time. Pt has normal reflexes. No cranial nerve deficit. Motor grossly intact, sens intact to LT to extremities Skin: Skin is warm and dry. No rash noted or new ulcers Psychiatric:  Has normal mood and affect. Behavior is normal.  Left third mcp with mild soft tissue effusion, decreased ROM, mild tender, without erythema or skin change    Assessment & Plan:

## 2015-11-11 NOTE — Telephone Encounter (Signed)
APPROVED through 04/03/2016 

## 2015-11-16 NOTE — Assessment & Plan Note (Signed)
D/w pt, for increased exercise if possible, and miralax daily prn

## 2015-11-16 NOTE — Assessment & Plan Note (Addendum)
Exam benign, ok to check b12  In addition to the time spent performing CPE, I spent an additional 25 minutes face to face,in which greater than 50% of this time was spent in counseling and coordination of care for patient's acute illness as documented.

## 2015-11-16 NOTE — Assessment & Plan Note (Signed)

## 2015-11-16 NOTE — Assessment & Plan Note (Signed)
Cont pain control, exam without neuro change, cont to follow

## 2015-11-16 NOTE — Assessment & Plan Note (Signed)
stable overall by history and exam, recent data reviewed with pt, and pt to continue medical treatment as before,  to f/u any worsening symptoms or concerns Lab Results  Component Value Date   HGBA1C 6.2 11/09/2015

## 2015-11-16 NOTE — Assessment & Plan Note (Signed)
With attention to left hand third mcp - for voltaran gel prn,  to f/u any worsening symptoms or concerns

## 2015-12-07 ENCOUNTER — Other Ambulatory Visit: Payer: Self-pay | Admitting: Internal Medicine

## 2016-01-19 ENCOUNTER — Other Ambulatory Visit: Payer: Self-pay | Admitting: *Deleted

## 2016-01-19 MED ORDER — TERAZOSIN HCL 2 MG PO CAPS: 2.0000 mg | ORAL_CAPSULE | Freq: Every day | ORAL | 2 refills | Status: DC

## 2016-01-26 ENCOUNTER — Other Ambulatory Visit: Payer: Self-pay | Admitting: Internal Medicine

## 2016-04-12 ENCOUNTER — Ambulatory Visit (INDEPENDENT_AMBULATORY_CARE_PROVIDER_SITE_OTHER): Payer: Medicare Other | Admitting: General Practice

## 2016-04-12 DIAGNOSIS — Z23 Encounter for immunization: Secondary | ICD-10-CM | POA: Diagnosis not present

## 2016-05-05 ENCOUNTER — Other Ambulatory Visit (INDEPENDENT_AMBULATORY_CARE_PROVIDER_SITE_OTHER): Payer: Medicare Other

## 2016-05-05 ENCOUNTER — Encounter: Payer: Self-pay | Admitting: Internal Medicine

## 2016-05-05 ENCOUNTER — Ambulatory Visit (INDEPENDENT_AMBULATORY_CARE_PROVIDER_SITE_OTHER): Payer: Medicare Other | Admitting: Internal Medicine

## 2016-05-05 VITALS — BP 138/80 | HR 63 | Temp 98.3°F | Resp 20 | Wt 203.8 lb

## 2016-05-05 DIAGNOSIS — Z0001 Encounter for general adult medical examination with abnormal findings: Secondary | ICD-10-CM | POA: Diagnosis not present

## 2016-05-05 DIAGNOSIS — E119 Type 2 diabetes mellitus without complications: Secondary | ICD-10-CM

## 2016-05-05 DIAGNOSIS — I10 Essential (primary) hypertension: Secondary | ICD-10-CM | POA: Diagnosis not present

## 2016-05-05 DIAGNOSIS — E785 Hyperlipidemia, unspecified: Secondary | ICD-10-CM

## 2016-05-05 LAB — LIPID PANEL
CHOL/HDL RATIO: 3
Cholesterol: 134 mg/dL (ref 0–200)
HDL: 39.6 mg/dL (ref >39.00)
LDL CALC: 84 mg/dL (ref 0–99)
NONHDL: 94.77
Triglycerides: 52 mg/dL (ref 0.0–149.0)
VLDL: 10.4 mg/dL (ref 0.0–40.0)

## 2016-05-05 LAB — BASIC METABOLIC PANEL
BUN: 19 mg/dL (ref 6–23)
CO2: 32 mEq/L (ref 19–32)
Calcium: 9.2 mg/dL (ref 8.4–10.5)
Chloride: 109 mEq/L (ref 96–112)
Creatinine, Ser: 1 mg/dL (ref 0.40–1.50)
GFR: 93.23 mL/min (ref >60.00)
Glucose, Bld: 107 mg/dL — ABNORMAL HIGH (ref 70–99)
POTASSIUM: 4.4 meq/L (ref 3.5–5.1)
SODIUM: 143 meq/L (ref 135–145)

## 2016-05-05 LAB — HEPATIC FUNCTION PANEL
ALK PHOS: 39 U/L (ref 39–117)
ALT: 13 U/L (ref 0–53)
AST: 27 U/L (ref 0–37)
Albumin: 3.9 g/dL (ref 3.5–5.2)
BILIRUBIN TOTAL: 0.4 mg/dL (ref 0.2–1.2)
Bilirubin, Direct: 0.1 mg/dL (ref 0.0–0.3)
Total Protein: 6.7 g/dL (ref 6.0–8.3)

## 2016-05-05 LAB — HEMOGLOBIN A1C: Hgb A1c MFr Bld: 6.2 % (ref 4.6–6.5)

## 2016-05-05 NOTE — Addendum Note (Signed)
Addended by: Biagio Borg on: 05/05/2016 09:20 PM   Modules accepted: Orders

## 2016-05-05 NOTE — Progress Notes (Signed)
Pre visit review using our clinic review tool, if applicable. No additional management support is needed unless otherwise documented below in the visit note. 

## 2016-05-05 NOTE — Patient Instructions (Signed)

## 2016-05-05 NOTE — Assessment & Plan Note (Signed)
stable overall by history and exam, recent data reviewed with pt, and pt to continue medical treatment as before,  to f/u any worsening symptoms or concerns Lab Results  Component Value Date   HGBA1C 6.2 11/09/2015   For fu lab today

## 2016-05-05 NOTE — Progress Notes (Signed)
Subjective:    [name], male    DOB: 1940-09-04, 76 y.o.   MRN: [id number]  HPI  Here to f/u; overall doing ok,  Pt denies chest pain, increasing sob or doe, wheezing, orthopnea, PND, increased LE swelling, palpitations, dizziness or syncope, though does have some occasional off balance feelling, no falls.   Pt denies new neurological symptoms such as new headache, or facial or extremity weakness or numbness.  Pt denies polydipsia, polyuria, or low sugar episode.   Pt denies new neurological symptoms such as new headache, or facial or extremity weakness or numbness.   Pt states overall good compliance with meds, mostly trying to follow appropriate diet, with wt overall stable,  but little exercise however.  Wt Readings from Last 3 Encounters:  05/05/16 203 lb 12 oz (92.4 kg)  11/10/15 203 lb (92.1 kg)  06/29/15 203 lb (92.1 kg)   BP Readings from Last 3 Encounters:  05/05/16 138/80  11/10/15 128/74  06/29/15 106/69  mentions worsening ED symptoms, cannot afford viagra. Past Medical History:  Diagnosis Date  . Acoustic neuroma ([place]) 06/25/2015  . Allergic rhinitis 07/18/2014  . Arthritis   . BPH (benign prostatic hyperplasia)   . Carbuncle    recurrent MRSA carbuncles  . Cataract   . Diabetes mellitus    Type II  . Disk prolapse   . Glaucoma   . Hyperlipidemia   . Hypertension   . Male hypogonadism 07/16/2014  . Sinus bradycardia    chronic, asymptomatic  . Sinusitis 07/29/2012   Past Surgical History:  Procedure Laterality Date  . CATARACT EXTRACTION     x 2  . COLONOSCOPY    . POLYPECTOMY      reports that he quit smoking about 37 years ago. He has never used smokeless tobacco. He reports that he drinks about 2.5 oz of alcohol per week . He reports that he does not use drugs. family history includes Cancer in his brother and mother; Diabetes in his sister; Hyperlipidemia in his brother; Hypertension in his brother, mother, and sister; Stroke in his  brother. No Known Allergies Current Outpatient Prescriptions on File Prior to Visit  Medication Sig Dispense Refill  . aspirin 81 MG tablet Take 81 mg by mouth daily.     . diclofenac sodium (VOLTAREN) 1 % GEL Apply 4 g topically 4 (four) times daily as needed. 400 g 11  . fluticasone (FLONASE) 50 MCG/ACT nasal spray Place 2 sprays into both nostrils daily. 16 g 2  . gabapentin (NEURONTIN) 100 MG capsule Take 1 capsule (100 mg total) by mouth at bedtime. 90 capsule 1  . glucose blood (ONE TOUCH TEST STRIPS) test strip Use as directed once daily 100 each 11  . lisinopril (PRINIVIL,ZESTRIL) 10 MG tablet TAKE 1 TABLET BY MOUTH EVERY DAY 30 tablet 5  . meclizine (ANTIVERT) 32 MG tablet Take 1 tablet (32 mg total) by mouth 3 (three) times daily as needed. 30 tablet 2  . metFORMIN (GLUCOPHAGE) 500 MG tablet TAKE 1 TABLET BY MOUTH 2 TIMES DAILY WITH A MEAL. 60 tablet 5  . ONE TOUCH LANCETS MISC Use to check blood sugar once daily (dx code 250.00) 100 each 11  . pravastatin (PRAVACHOL) 40 MG tablet TAKE 1 TABLET BY MOUTH EVERY DAY 30 tablet 4  . terazosin (HYTRIN) 2 MG capsule Take 1 capsule (2 mg total) by mouth daily. 90 capsule 2   No current facility-administered medications on file prior to visit.  Review of Systems  Constitutional: Negative for unusual diaphoresis or night sweats HENT: Negative for ear swelling or discharge Eyes: Negative for worsening visual haziness  Respiratory: Negative for choking and stridor.   Gastrointestinal: Negative for distension or worsening eructation Genitourinary: Negative for retention or change in urine volume.  Musculoskeletal: Negative for other MSK pain or swelling Skin: Negative for color change and worsening wound Neurological: Negative for tremors and numbness other than noted  Psychiatric/Behavioral: Negative for decreased concentration or agitation other than above   All other system neg per pt    Objective:   Physical Exam BP 138/80    Pulse 63   Temp 98.3 F (36.8 C) (Oral)   Resp 20   Wt 203 lb 12 oz (92.4 kg)   SpO2 98%   BMI 30.98 kg/m  VS noted,  Constitutional: Pt appears in no apparent distress HENT: Head: NCAT.  Right Ear: External ear normal.  Left Ear: External ear normal.  Eyes: . Pupils are equal, round, and reactive to light. Conjunctivae and EOM are normal Neck: Normal range of motion. Neck supple.  Cardiovascular: Normal rate and regular rhythm.   Pulmonary/Chest: Effort normal and breath sounds without rales or wheezing.  Abd:  Soft, NT, ND, + BS Neurological: Pt is alert. Not confused , motor grossly intact Skin: Skin is warm. No rash, no LE edema Psychiatric: Pt behavior is normal. No agitation.  No other new exam findings     Assessment & Plan:

## 2016-05-05 NOTE — Assessment & Plan Note (Signed)
stable overall by history and exam, recent data reviewed with pt, and pt to continue medical treatment as before,  to f/u any worsening symptoms or concerns BP Readings from Last 3 Encounters:  05/05/16 138/80  11/10/15 128/74  06/29/15 106/69

## 2016-05-05 NOTE — Assessment & Plan Note (Signed)
stable overall by history and exam, recent data reviewed with pt, and pt to continue medical treatment as before,  to f/u any worsening symptoms or concerns Lab Results  Component Value Date   LDLCALC 88 11/09/2015   For fu lab today, cont diet

## 2016-05-06 ENCOUNTER — Encounter: Payer: Self-pay | Admitting: Internal Medicine

## 2016-05-14 ENCOUNTER — Other Ambulatory Visit: Payer: Self-pay | Admitting: Internal Medicine

## 2016-05-16 ENCOUNTER — Other Ambulatory Visit: Payer: Self-pay

## 2016-07-25 ENCOUNTER — Other Ambulatory Visit: Payer: Self-pay | Admitting: Internal Medicine

## 2016-08-01 ENCOUNTER — Other Ambulatory Visit: Payer: Self-pay | Admitting: *Deleted

## 2016-08-01 MED ORDER — LISINOPRIL 10 MG PO TABS: 10.0000 mg | ORAL_TABLET | Freq: Every day | ORAL | 2 refills | Status: DC

## 2016-08-10 MED ORDER — LISINOPRIL 10 MG PO TABS: 10.0000 mg | ORAL_TABLET | Freq: Every day | ORAL | 2 refills | Status: DC

## 2016-08-10 NOTE — Telephone Encounter (Addendum)
Rec'd fax pt is wanting to get 90 day on his Lisinopril which will save him money. Change to 90 day script...Jonathan Lewis

## 2016-08-10 NOTE — Addendum Note (Signed)
Addended by: Earnstine Regal on: 08/10/2016 11:54 AM   Modules accepted: Orders

## 2016-08-24 LAB — HM DIABETES EYE EXAM

## 2016-08-25 ENCOUNTER — Ambulatory Visit: Payer: Medicare Other | Attending: Otolaryngology | Admitting: Physical Therapy

## 2016-08-25 DIAGNOSIS — R42 Dizziness and giddiness: Secondary | ICD-10-CM | POA: Diagnosis not present

## 2016-08-25 NOTE — Patient Instructions (Signed)

## 2016-08-26 NOTE — Therapy (Signed)
[facility] [address] [place], [place], [address] Phone: [phone]   Fax:  [phone]  Physical Therapy Evaluation  Patient Details  Name: Jonathan Lewis MRN: [id number] Date of Birth: 1940/12/03 Referring Provider: Lavonia Dana, MD  Encounter Date: 08/25/2016      PT End of Session - 08/26/16 1324    Visit Number 1   Authorization Type [employer] Medicare   PT Start Time 7824   PT Stop Time 1629   PT Time Calculation (min) 52 min      Past Medical History:  Diagnosis Date  . Acoustic neuroma ([place]) 06/25/2015  . Allergic rhinitis 07/18/2014  . Arthritis   . BPH (benign prostatic hyperplasia)   . Carbuncle    recurrent MRSA carbuncles  . Cataract   . Diabetes mellitus    Type II  . Disk prolapse   . Glaucoma   . Hyperlipidemia   . Hypertension   . Male hypogonadism 07/16/2014  . Sinus bradycardia    chronic, asymptomatic  . Sinusitis 07/29/2012    Past Surgical History:  Procedure Laterality Date  . CATARACT EXTRACTION     x 2  . COLONOSCOPY    . POLYPECTOMY      There were no vitals filed for this visit.       Subjective Assessment - 08/26/16 1314    Subjective Pt reports dizziness is intermittent - happens when he lies down or bends over and gets up; says nausea accompanies the vertigo, denies vomiting; occasional tinnitus reported  Pt reports he has not had any spinning vertigo in past month   Pertinent History DM Type 2:  Acoustic Neuroma diagnosed 06/2015   Patient Stated Goals improve the dizziness            [facility] PT Assessment - 08/26/16 0001      Assessment   Medical Diagnosis Vertigo   Referring Provider Lavonia Dana, MD   Onset Date/Surgical Date --  Feb. 2017     Balance Screen   Has the patient fallen in the past 6 months No   Has the patient had a decrease in activity level because of a fear of falling?  No   Is the patient reluctant to leave their home  because of a fear of falling?  No            Vestibular Assessment - 08/26/16 0001      Vestibular Assessment   General Observation Pt is a 76 year old gentleman with c/o spinning vertigo that last occurred about a month ago;  pt denies having this vertigo within past month     Symptom Behavior   Type of Dizziness Spinning  when it did occur   Frequency of Dizziness none at this time   Duration of Dizziness lasted seconds when it did occur   Aggravating Factors Forward bending;Turning head quickly     Occulomotor Exam   Occulomotor Alignment Normal   Spontaneous Absent   Smooth Pursuits Intact     Positional Testing   Sidelying Test Sidelying Right;Sidelying Left   Horizontal Canal Testing Horizontal Canal Right;Horizontal Canal Left     Sidelying Right   Sidelying Right Duration none   Sidelying Right Symptoms No nystagmus     Sidelying Left   Sidelying Left Duration none   Sidelying Left Symptoms No nystagmus     Horizontal Canal Right   Horizontal Canal Right Duration none   Horizontal Canal Right Symptoms Normal  Horizontal Canal Left   Horizontal Canal Left Duration none   Horizontal Canal Left Symptoms Normal     Positional Sensitivities   Sit to Supine No dizziness   Supine to Left Side No dizziness   Supine to Right Side No dizziness   Supine to Sitting No dizziness   Nose to Right Knee No dizziness   Nose to Left Knee No dizziness   Head Turning x 5 No dizziness   Rolling Right No dizziness   Rolling Left No dizziness                       PT Education - 08/26/16 1321    Education provided Yes   Education Details pt given info on BPPV and info on vestibular schwananoma; recommended pt to walk for exercise for balance/vestibular function   Person(s) Educated Patient   Methods Explanation;Handout   Comprehension Verbalized understanding                    Plan - 08/26/16 1324    Clinical Impression Statement Pt  has no c/o vertigo at this time;  symptoms appear to be consistent with BPPV that has resolved as of current time.  No positional testing provoked vertigo; balance and gait are WFL's.  Pt agrees that no PT is needed at this time   Rehab Potential Good   PT Frequency One time visit   PT Treatment/Interventions Patient/family education;ADLs/Self Care Home Management   PT Next Visit Plan N/A - eval only   Consulted and Agree with Plan of Care Patient      Patient will benefit from skilled therapeutic intervention in order to improve the following deficits and impairments:  Dizziness, Decreased balance  Visit Diagnosis: Dizziness and giddiness - Plan: PT plan of care cert/re-cert      G-Codes - 93/81/82 1326    Functional Assessment Tool Used (Outpatient Only) no c/o vertigo at this time - all positional testing (-)   Functional Limitation Changing and maintaining body position   Changing and Maintaining Body Position Current Status (X9371) 0 percent impaired, limited or restricted   Changing and Maintaining Body Position Goal Status (I9678) 0 percent impaired, limited or restricted   Changing and Maintaining Body Position Discharge Status (L3810) 0 percent impaired, limited or restricted       Problem List Patient Active Problem List   Diagnosis Date Noted  . Bilateral leg paresthesia 11/10/2015  . Acoustic neuroma ([place]) 06/25/2015  . Hematochezia 05/13/2015  . Abnormal finding on MRI of brain 05/13/2015  . Vertigo 05/05/2015  . GERD (gastroesophageal reflux disease) 05/05/2015  . BPH (benign prostatic hyperplasia) 05/05/2015  . Bradycardia   . Chest pain 07/18/2014  . Allergic rhinitis 07/18/2014  . Pain of right thumb 07/18/2014  . Male hypogonadism 07/16/2014  . Encounter for well adult exam with abnormal findings 07/17/2013  . Right sided sciatica 07/17/2013  . Constipation 01/21/2012  . Leukopenia 06/11/2011  . HEARING LOSS 04/10/2009  . GROIN PAIN 01/26/2009  .  Osteoarthritis, hand 10/27/2008  . LOW BACK PAIN, CHRONIC 10/27/2008  . Diabetes ([place]) 07/17/2008  . Hyperlipidemia 06/06/2007  . EXTERNAL HEMORRHOIDS 02/15/2007  . BENIGN PROSTATIC HYPERTROPHY 02/15/2007  . ERECTILE DYSFUNCTION 10/31/2006  . Essential hypertension 10/31/2006    Alda Lea, PT 08/26/2016, 1:31 PM  [facility] [address] [place], [place], [address] Phone: [phone]   Fax:  [phone]  Name: Jonathan Lewis  MRN: [id number] Date of Birth: October 14, 1940

## 2016-10-27 ENCOUNTER — Other Ambulatory Visit: Payer: Self-pay | Admitting: Internal Medicine

## 2016-11-01 ENCOUNTER — Other Ambulatory Visit (INDEPENDENT_AMBULATORY_CARE_PROVIDER_SITE_OTHER): Payer: Medicare Other

## 2016-11-01 DIAGNOSIS — R202 Paresthesia of skin: Secondary | ICD-10-CM

## 2016-11-01 DIAGNOSIS — E119 Type 2 diabetes mellitus without complications: Secondary | ICD-10-CM | POA: Diagnosis not present

## 2016-11-01 DIAGNOSIS — Z0001 Encounter for general adult medical examination with abnormal findings: Secondary | ICD-10-CM | POA: Diagnosis not present

## 2016-11-01 DIAGNOSIS — M5431 Sciatica, right side: Secondary | ICD-10-CM | POA: Diagnosis not present

## 2016-11-01 LAB — BASIC METABOLIC PANEL
BUN: 16 mg/dL (ref 6–23)
CALCIUM: 8.8 mg/dL (ref 8.4–10.5)
CO2: 30 mEq/L (ref 19–32)
CREATININE: 0.97 mg/dL (ref 0.40–1.50)
Chloride: 108 mEq/L (ref 96–112)
GFR: 96.44 mL/min (ref >60.00)
Glucose, Bld: 118 mg/dL — ABNORMAL HIGH (ref 70–99)
Potassium: 4 mEq/L (ref 3.5–5.1)
Sodium: 143 mEq/L (ref 135–145)

## 2016-11-01 LAB — LIPID PANEL
CHOL/HDL RATIO: 4
Cholesterol: 131 mg/dL (ref 0–200)
HDL: 33.1 mg/dL — AB (ref >39.00)
LDL CALC: 61 mg/dL (ref 0–99)
NONHDL: 97.58
Triglycerides: 183 mg/dL — ABNORMAL HIGH (ref 0.0–149.0)
VLDL: 36.6 mg/dL (ref 0.0–40.0)

## 2016-11-01 LAB — HEPATIC FUNCTION PANEL
ALBUMIN: 3.9 g/dL (ref 3.5–5.2)
ALK PHOS: 38 U/L — AB (ref 39–117)
ALT: 10 U/L (ref 0–53)
AST: 15 U/L (ref 0–37)
Bilirubin, Direct: 0.1 mg/dL (ref 0.0–0.3)
TOTAL PROTEIN: 6.3 g/dL (ref 6.0–8.3)
Total Bilirubin: 0.5 mg/dL (ref 0.2–1.2)

## 2016-11-01 LAB — URINALYSIS, ROUTINE W REFLEX MICROSCOPIC
BILIRUBIN URINE: NEGATIVE
Hgb urine dipstick: NEGATIVE
KETONES UR: NEGATIVE
LEUKOCYTES UA: NEGATIVE
Nitrite: NEGATIVE
PH: 6 (ref 5.0–8.0)
RBC / HPF: NONE SEEN (ref 0–?)
Specific Gravity, Urine: 1.015 (ref 1.000–1.030)
TOTAL PROTEIN, URINE-UPE24: NEGATIVE
URINE GLUCOSE: NEGATIVE
UROBILINOGEN UA: 1 (ref 0.0–1.0)
WBC, UA: NONE SEEN (ref 0–?)

## 2016-11-01 LAB — PSA: PSA: 1.62 ng/mL (ref 0.10–4.00)

## 2016-11-01 LAB — CBC WITH DIFFERENTIAL/PLATELET
BASOS ABS: 0 10*3/uL (ref 0.0–0.1)
Basophils Relative: 0.7 % (ref 0.0–3.0)
EOS ABS: 0 10*3/uL (ref 0.0–0.7)
Eosinophils Relative: 1.5 % (ref 0.0–5.0)
HCT: 36.3 % — ABNORMAL LOW (ref 39.0–52.0)
Hemoglobin: 12.1 g/dL — ABNORMAL LOW (ref 13.0–17.0)
LYMPHS ABS: 1.4 10*3/uL (ref 0.7–4.0)
Lymphocytes Relative: 51.1 % — ABNORMAL HIGH (ref 12.0–46.0)
MCHC: 33.2 g/dL (ref 30.0–36.0)
MCV: 87.3 fl (ref 78.0–100.0)
MONOS PCT: 10.9 % (ref 3.0–12.0)
Monocytes Absolute: 0.3 10*3/uL (ref 0.1–1.0)
NEUTROS PCT: 35.8 % — AB (ref 43.0–77.0)
Neutro Abs: 0.9 10*3/uL — ABNORMAL LOW (ref 1.4–7.7)
Platelets: 176 10*3/uL (ref 150.0–400.0)
RBC: 4.15 Mil/uL — AB (ref 4.22–5.81)
RDW: 13.5 % (ref 11.5–15.5)
WBC: 2.6 10*3/uL — ABNORMAL LOW (ref 4.0–10.5)

## 2016-11-01 LAB — TSH: TSH: 2.18 u[IU]/mL (ref 0.35–4.50)

## 2016-11-01 LAB — MICROALBUMIN / CREATININE URINE RATIO
Creatinine,U: 112.5 mg/dL
Microalb Creat Ratio: 0.6 mg/g (ref 0.0–30.0)

## 2016-11-01 LAB — VITAMIN B12: VITAMIN B 12: 391 pg/mL (ref 211–911)

## 2016-11-01 LAB — HEMOGLOBIN A1C: Hgb A1c MFr Bld: 6.2 % (ref 4.6–6.5)

## 2016-11-03 ENCOUNTER — Ambulatory Visit (INDEPENDENT_AMBULATORY_CARE_PROVIDER_SITE_OTHER): Payer: Medicare Other | Admitting: Internal Medicine

## 2016-11-03 ENCOUNTER — Encounter: Payer: Self-pay | Admitting: Internal Medicine

## 2016-11-03 VITALS — BP 138/86 | HR 61 | Ht 68.0 in | Wt 202.0 lb

## 2016-11-03 DIAGNOSIS — M542 Cervicalgia: Secondary | ICD-10-CM | POA: Insufficient documentation

## 2016-11-03 DIAGNOSIS — Z0000 Encounter for general adult medical examination without abnormal findings: Secondary | ICD-10-CM | POA: Diagnosis not present

## 2016-11-03 DIAGNOSIS — E119 Type 2 diabetes mellitus without complications: Secondary | ICD-10-CM | POA: Diagnosis not present

## 2016-11-03 DIAGNOSIS — R059 Cough, unspecified: Secondary | ICD-10-CM | POA: Insufficient documentation

## 2016-11-03 DIAGNOSIS — E785 Hyperlipidemia, unspecified: Secondary | ICD-10-CM | POA: Diagnosis not present

## 2016-11-03 DIAGNOSIS — I10 Essential (primary) hypertension: Secondary | ICD-10-CM

## 2016-11-03 DIAGNOSIS — R05 Cough: Secondary | ICD-10-CM | POA: Diagnosis not present

## 2016-11-03 MED ORDER — BENZONATATE 100 MG PO CAPS: ORAL_CAPSULE | ORAL | 0 refills | Status: DC

## 2016-11-03 MED ORDER — AZITHROMYCIN 250 MG PO TABS: ORAL_TABLET | ORAL | 1 refills | Status: DC

## 2016-11-03 NOTE — Progress Notes (Signed)
Subjective:    [name], male    DOB: 08-04-1940, 76 y.o.   MRN: [id number]  HPI  Here with acute onset mild to mod 2-3 days ST, HA, general weakness and malaise, with prod cough greenish sputum, but Pt denies chest pain, increased sob or doe, wheezing, orthopnea, PND, increased LE swelling, palpitations, dizziness or syncope.   Pt denies polydipsia, polyuria, or low sugar symptoms such as weakness or confusion improved with po intake.  Pt states overall good compliance with meds, trying to follow lower cholesterol, diabetic diet, wt overall stable. Wt Readings from Last 3 Encounters:  11/03/16 202 lb (91.6 kg)  05/05/16 203 lb 12 oz (92.4 kg)  11/10/15 203 lb (92.1 kg)  Also has mild left postlat neck/upper back pain, mild, sharp, intermittent for 3 days, worse to turn head to the left, but no radicular pain Past Medical History:  Diagnosis Date  . Acoustic neuroma ([place]) 06/25/2015  . Allergic rhinitis 07/18/2014  . Arthritis   . BPH (benign prostatic hyperplasia)   . Carbuncle    recurrent MRSA carbuncles  . Cataract   . Diabetes mellitus    Type II  . Disk prolapse   . Glaucoma   . Hyperlipidemia   . Hypertension   . Male hypogonadism 07/16/2014  . Sinus bradycardia    chronic, asymptomatic  . Sinusitis 07/29/2012   Past Surgical History:  Procedure Laterality Date  . CATARACT EXTRACTION     x 2  . COLONOSCOPY    . POLYPECTOMY      reports that he quit smoking about 38 years ago. He has never used smokeless tobacco. He reports that he drinks about 2.5 oz of alcohol per week . He reports that he does not use drugs. family history includes Cancer in his brother and mother; Diabetes in his sister; Hyperlipidemia in his brother; Hypertension in his brother, mother, and sister; Stroke in his brother. No Known Allergies Current Outpatient Prescriptions on File Prior to Visit  Medication Sig Dispense Refill  . aspirin 81 MG tablet Take 81 mg by mouth daily.       Marland Kitchen glucose blood (ONE TOUCH TEST STRIPS) test strip Use as directed once daily 100 each 11  . lisinopril (PRINIVIL,ZESTRIL) 10 MG tablet Take 1 tablet (10 mg total) by mouth daily. 90 tablet 2  . metFORMIN (GLUCOPHAGE) 500 MG tablet TAKE 1 TABLET BY MOUTH 2 TIMES DAILY WITH A MEAL. 60 tablet 5  . ONE TOUCH LANCETS MISC Use to check blood sugar once daily (dx code 250.00) 100 each 11  . pravastatin (PRAVACHOL) 40 MG tablet TAKE 1 TABLET BY MOUTH EVERY DAY 30 tablet 5   No current facility-administered medications on file prior to visit.    Review of Systems  Constitutional: Negative for other unusual diaphoresis or sweats HENT: Negative for ear discharge or swelling Eyes: Negative for other worsening visual disturbances Respiratory: Negative for stridor or other swelling  Gastrointestinal: Negative for worsening distension or other blood Genitourinary: Negative for retention or other urinary change Musculoskeletal: Negative for other MSK pain or swelling Skin: Negative for color change or other new lesions Neurological: Negative for worsening tremors and other numbness  Psychiatric/Behavioral: Negative for worsening agitation or other fatigue All other system neg per pt    Objective:   Physical Exam BP 138/86   Pulse 61   Ht 5\' 8"  (1.727 m)   Wt 202 lb (91.6 kg)   SpO2 100%   BMI  30.71 kg/m  VS noted, mild ill Constitutional: Pt appears in NAD HENT: Head: NCAT.  Right Ear: External ear normal.  Left Ear: External ear normal.  Eyes: . Pupils are equal, round, and reactive to light. Conjunctivae and EOM are normal Bilat tm's with mild erythema.  Max sinus areas non tender.  Pharynx with mild erythema, no exudate Nose: without d/c or deformity Neck: Neck supple. Gross normal ROM Cardiovascular: Normal rate and regular rhythm.   Pulmonary/Chest: Effort normal and breath sounds decreased without rales or wheezing.  Spine Nontender, has mild left paravertebral tender area without  skin change, swelling or rash just medial to scapula and trapezoid as well Neurological: Pt is alert. At baseline orientation, motor grossly intact Skin: Skin is warm. No rashes, other new lesions, no LE edema Psychiatric: Pt behavior is normal without agitation  No other exam findings    Assessment & Plan:

## 2016-11-03 NOTE — Patient Instructions (Signed)
Please take all new medication as prescribed - the antibiotic, and pill medication for cough if needed  You can also take Delsym OTC for cough, and/or Mucinex (or it's generic off brand) for congestion, and tylenol as needed for pain.  OK to use OTC Alleve for the left neck pain for now  Please continue all other medications as before, and refills have been done if requested.  Please have the pharmacy call with any other refills you may need.  Please continue your efforts at being more active, low cholesterol diet, and weight control.  Please keep your appointments with your specialists as you may have planned  Please return in 6 months, or sooner if needed, with Lab testing done 3-5 days before

## 2016-11-06 NOTE — Assessment & Plan Note (Signed)
stable overall by history and exam, recent data reviewed with pt, and pt to continue medical treatment as before,  to f/u any worsening symptoms or concerns Lab Results  Component Value Date   LDLCALC 61 11/01/2016

## 2016-11-06 NOTE — Assessment & Plan Note (Signed)
stable overall by history and exam, recent data reviewed with pt, and pt to continue medical treatment as before,  to f/u any worsening symptoms or concerns BP Readings from Last 3 Encounters:  11/03/16 138/86  05/05/16 138/80  11/10/15 128/74

## 2016-11-06 NOTE — Assessment & Plan Note (Signed)
C.w msk most likely, no neuro changes, for alleve otc prn

## 2016-11-06 NOTE — Assessment & Plan Note (Signed)
stable overall by history and exam, recent data reviewed with pt, and pt to continue medical treatment as before,  to f/u any worsening symptoms or concerns Lab Results  Component Value Date   HGBA1C 6.2 11/01/2016

## 2016-11-06 NOTE — Assessment & Plan Note (Signed)
Mild to mod, c/w bronchitis vs pna, declines cxr, for antibx course, cough med prn,  to f/u any worsening symptoms or concerns 

## 2016-12-03 ENCOUNTER — Other Ambulatory Visit: Payer: Self-pay | Admitting: Internal Medicine

## 2016-12-08 ENCOUNTER — Ambulatory Visit (HOSPITAL_COMMUNITY)
Admission: EM | Admit: 2016-12-08 | Discharge: 2016-12-08 | Disposition: A | Discharge status: Home / Self Care | Payer: Medicare Other | Attending: Internal Medicine | Admitting: Internal Medicine

## 2016-12-08 ENCOUNTER — Ambulatory Visit (INDEPENDENT_AMBULATORY_CARE_PROVIDER_SITE_OTHER): Payer: Medicare Other

## 2016-12-08 ENCOUNTER — Encounter (HOSPITAL_COMMUNITY): Payer: Self-pay | Admitting: Family Medicine

## 2016-12-08 DIAGNOSIS — J9801 Acute bronchospasm: Secondary | ICD-10-CM

## 2016-12-08 DIAGNOSIS — J209 Acute bronchitis, unspecified: Secondary | ICD-10-CM

## 2016-12-08 DIAGNOSIS — R05 Cough: Secondary | ICD-10-CM | POA: Diagnosis not present

## 2016-12-08 DIAGNOSIS — R059 Cough, unspecified: Secondary | ICD-10-CM

## 2016-12-08 MED ORDER — TERAZOSIN HCL 2 MG PO CAPS: 2.0000 mg | ORAL_CAPSULE | Freq: Every day | ORAL | 0 refills | Status: DC

## 2016-12-08 MED ORDER — PREDNISONE 50 MG PO TABS: ORAL_TABLET | ORAL | 0 refills | Status: DC

## 2016-12-08 MED ORDER — ALBUTEROL SULFATE HFA 108 (90 BASE) MCG/ACT IN AERS: 2.0000 | INHALATION_SPRAY | RESPIRATORY_TRACT | 0 refills | Status: DC | PRN

## 2016-12-08 MED ORDER — GUAIFENESIN-CODEINE 100-10 MG/5ML PO SYRP: ORAL_SOLUTION | ORAL | 0 refills | Status: DC

## 2016-12-08 NOTE — ED Triage Notes (Signed)
Pt here for cough, congestion and not feeling well since Tuesday. Low grade fever.

## 2016-12-08 NOTE — ED Provider Notes (Addendum)
[place]    CSN: [id number] Arrival date & time: 12/08/16  1353     History   Chief Complaint Chief Complaint  Patient presents with  . Cough  . Nasal Congestion    HPI Jonathan Lewis is a 76 y.o. male.   76 year old man planing of cough and chest congestion for the past few days this week. He denies nasal or sinus congestion. Denies PND, shortness of breath or chest pain. Denies history of asthma or smoking. It is noticed that his temperature is 99.1. Sats are 100%. He has soreness in the upper mid chest upon coughing. No other complaints      Past Medical History:  Diagnosis Date  . Acoustic neuroma ([place]) 06/25/2015  . Allergic rhinitis 07/18/2014  . Arthritis   . BPH (benign prostatic hyperplasia)   . Carbuncle    recurrent MRSA carbuncles  . Cataract   . Diabetes mellitus    Type II  . Disk prolapse   . Glaucoma   . Hyperlipidemia   . Hypertension   . Male hypogonadism 07/16/2014  . Sinus bradycardia    chronic, asymptomatic  . Sinusitis 07/29/2012    Patient Active Problem List   Diagnosis Date Noted  . Cough 11/03/2016  . Neck pain on left side 11/03/2016  . Bilateral leg paresthesia 11/10/2015  . Acoustic neuroma ([place]) 06/25/2015  . Hematochezia 05/13/2015  . Abnormal finding on MRI of brain 05/13/2015  . Vertigo 05/05/2015  . GERD (gastroesophageal reflux disease) 05/05/2015  . BPH (benign prostatic hyperplasia) 05/05/2015  . Bradycardia   . Chest pain 07/18/2014  . Allergic rhinitis 07/18/2014  . Pain of right thumb 07/18/2014  . Male hypogonadism 07/16/2014  . Encounter for well adult exam with abnormal findings 07/17/2013  . Right sided sciatica 07/17/2013  . Constipation 01/21/2012  . Leukopenia 06/11/2011  . HEARING LOSS 04/10/2009  . GROIN PAIN 01/26/2009  . Osteoarthritis, hand 10/27/2008  . LOW BACK PAIN, CHRONIC 10/27/2008  . Diabetes ([place]) 07/17/2008  . Hyperlipidemia 06/06/2007  . EXTERNAL HEMORRHOIDS 02/15/2007    . BENIGN PROSTATIC HYPERTROPHY 02/15/2007  . ERECTILE DYSFUNCTION 10/31/2006  . Essential hypertension 10/31/2006    Past Surgical History:  Procedure Laterality Date  . CATARACT EXTRACTION     x 2  . COLONOSCOPY    . POLYPECTOMY         Home Medications    Prior to Admission medications   Medication Sig Start Date End Date Taking? Authorizing Provider  albuterol (PROVENTIL HFA;VENTOLIN HFA) 108 (90 Base) MCG/ACT inhaler Inhale 2 puffs into the lungs every 4 (four) hours as needed for wheezing or shortness of breath. 12/08/16   Janne Napoleon, NP  aspirin 81 MG tablet Take 81 mg by mouth daily.     [provider]  benzonatate (TESSALON PERLES) 100 MG capsule 1-2 tab by mouth every 6 hrs as needed for cough 11/03/16   Biagio Borg, MD  glucose blood (ONE TOUCH TEST STRIPS) test strip Use as directed once daily 11/10/15   Biagio Borg, MD  guaiFENesin-codeine (CHERATUSSIN AC) 100-10 MG/5ML syrup Take 5 mm by mouth every 6 hours when necessary cough. 12/08/16   Janne Napoleon, NP  lisinopril (PRINIVIL,ZESTRIL) 10 MG tablet Take 1 tablet (10 mg total) by mouth daily. 08/10/16   Biagio Borg, MD  metFORMIN (GLUCOPHAGE) 500 MG tablet TAKE 1 TABLET BY MOUTH 2 TIMES DAILY WITH A MEAL. 10/27/16   Biagio Borg, MD  ONE [facility] LANCETS  MISC Use to check blood sugar once daily (dx code 250.00) 11/10/15   Biagio Borg, MD  pravastatin (PRAVACHOL) 40 MG tablet TAKE 1 TABLET BY MOUTH EVERY DAY 07/25/16   Biagio Borg, MD  predniSONE (DELTASONE) 50 MG tablet 1 tab po on day 1, one tab po on day 2, 1/2 tab po on days 3 and 4 12/08/16   Janne Napoleon, NP  terazosin (HYTRIN) 2 MG capsule Take 1 capsule (2 mg total) by mouth at bedtime. 12/08/16   Janne Napoleon, NP    Family History Family History  Problem Relation Age of Onset  . Cancer Mother   . Hypertension Mother   . Diabetes Sister   . Cancer Brother        colon  . Hypertension Brother   . Hyperlipidemia Brother   . Stroke Brother   . Hypertension  Sister   . Colon cancer Neg Hx   . Heart attack Neg Hx   . Sudden death Neg Hx     Social History Social History  Substance Use Topics  . Smoking status: Former Smoker    Quit date: 10/03/1978  . Smokeless tobacco: Never Used  . Alcohol use 2.5 oz/week    5 Standard drinks or equivalent per week     Comment: pint, occasion beer     Allergies   Patient has no known allergies.   Review of Systems Review of Systems  Constitutional: Positive for activity change and fatigue. Negative for fever.  HENT: Negative.   Respiratory: Positive for cough. Negative for shortness of breath and wheezing.   Cardiovascular: Negative for chest pain, palpitations and leg swelling.  Gastrointestinal: Negative.   Neurological: Negative.   All other systems reviewed and are negative.    Physical Exam Triage Vital Signs ED Triage Vitals [12/08/16 1420]  Enc Vitals Group     BP (!) 141/84     Pulse Rate (!) 57     Resp 18     Temp 99.1 F (37.3 C)     Temp src      SpO2 100 %     Weight      Height      Head Circumference      Peak Flow      Pain Score      Pain Loc      Pain Edu?      Excl. in [place]?    No data found.   Updated Vital Signs BP (!) 141/84   Pulse (!) 57   Temp 99.1 F (37.3 C)   Resp 18   SpO2 100%   Visual Acuity Right Eye Distance:   Left Eye Distance:   Bilateral Distance:    Right Eye Near:   Left Eye Near:    Bilateral Near:     Physical Exam  Constitutional: He is oriented to person, place, and time. He appears well-developed and well-nourished. No distress.  HENT:  ENT normal with exception of minor light erythema to the posterior pharynx.  Eyes: EOM are normal.  Neck: Normal range of motion. Neck supple.  Cardiovascular: Normal rate, regular rhythm, normal heart sounds and intact distal pulses.   Pulmonary/Chest: Effort normal and breath sounds normal. No respiratory distress. He has no wheezes.  Breath sounds are clear with deep breathing  however with forced cough there is bilateral coarseness.  Abdominal: Soft.  Musculoskeletal: He exhibits no edema.  Lymphadenopathy:    He has no cervical adenopathy.  Neurological:  He is alert and oriented to person, place, and time. He exhibits normal muscle tone.  Skin: Skin is warm and dry.  Psychiatric: He has a normal mood and affect.  Nursing note and vitals reviewed.    UC Treatments / Results  Labs (all labs ordered are listed, but only abnormal results are displayed) Labs Reviewed - No data to display  EKG  EKG Interpretation None       Radiology Dg Chest 2 View  Result Date: 12/08/2016 CLINICAL DATA:  Cough, fever, diabetes EXAM: CHEST  2 VIEW COMPARISON:  Chest x-ray of 05/05/2015 FINDINGS: No active infiltrate or effusion is seen. A probable small granuloma remains in the right mid lung. Mediastinal and hilar contours are unremarkable. The heart is borderline enlarged. No acute bony abnormality is seen. IMPRESSION: No active cardiopulmonary disease. Electronically Signed   By: Ivar Drape M.D.   On: 12/08/2016 15:43    Procedures Procedures (including critical care time)  Medications Ordered in UC Medications - No data to display   Initial Impression / Assessment and Plan / UC Course  I have reviewed the triage vital signs and the nursing notes.  Pertinent labs & imaging results that were available during my care of the patient were reviewed by me and considered in my medical decision making (see chart for details).    You are given medications to help with cough and bronchospasm. Use the albuterol inhaler 2 puffs every 4 hours as needed for cough and wheeze. Take the prednisone tablets daily for 3 days with food as directed. He will also have cough medicine and a refill of the blood pressure/prostate medication. Call your doctor for follow-up appointment. He may also take Tylenol every 4 hours for discomfort and fever. Follow-up with your doctor as  needed.     Final Clinical Impressions(s) / UC Diagnoses   Final diagnoses:  Cough  Bronchospasm  Acute bronchitis, unspecified organism    New Prescriptions New Prescriptions   ALBUTEROL (PROVENTIL HFA;VENTOLIN HFA) 108 (90 BASE) MCG/ACT INHALER    Inhale 2 puffs into the lungs every 4 (four) hours as needed for wheezing or shortness of breath.   GUAIFENESIN-CODEINE (CHERATUSSIN AC) 100-10 MG/5ML SYRUP    Take 5 mm by mouth every 6 hours when necessary cough.   PREDNISONE (DELTASONE) 50 MG TABLET    1 tab po on day 1, one tab po on day 2, 1/2 tab po on days 3 and 4   TERAZOSIN (HYTRIN) 2 MG CAPSULE    Take 1 capsule (2 mg total) by mouth at bedtime.   Written instructions and discharged by provider at 1600 hrs.  Controlled Substance Prescriptions  Controlled Substance Registry consulted? Not Applicable   Janne Napoleon, NP 12/08/16 1559    Janne Napoleon, NP 12/08/16 [phone]

## 2016-12-08 NOTE — Discharge Instructions (Signed)
You are given medications to help with cough and bronchospasm. Use the albuterol inhaler 2 puffs every 4 hours as needed for cough and wheeze. Take the prednisone tablets daily for 3 days with food as directed. He will also have cough medicine and a refill of the blood pressure/prostate medication. Call your doctor for follow-up appointment. He may also take Tylenol every 4 hours for discomfort and fever. Follow-up with your doctor as needed.

## 2016-12-13 ENCOUNTER — Encounter: Payer: Self-pay | Admitting: Internal Medicine

## 2016-12-13 ENCOUNTER — Ambulatory Visit (INDEPENDENT_AMBULATORY_CARE_PROVIDER_SITE_OTHER): Payer: Medicare Other | Admitting: Internal Medicine

## 2016-12-13 VITALS — BP 118/78 | HR 62 | Temp 98.8°F | Ht 68.0 in | Wt 201.0 lb

## 2016-12-13 DIAGNOSIS — R059 Cough, unspecified: Secondary | ICD-10-CM

## 2016-12-13 DIAGNOSIS — R05 Cough: Secondary | ICD-10-CM | POA: Diagnosis not present

## 2016-12-13 DIAGNOSIS — R062 Wheezing: Secondary | ICD-10-CM | POA: Diagnosis not present

## 2016-12-13 DIAGNOSIS — Z23 Encounter for immunization: Secondary | ICD-10-CM | POA: Diagnosis not present

## 2016-12-13 DIAGNOSIS — E119 Type 2 diabetes mellitus without complications: Secondary | ICD-10-CM | POA: Diagnosis not present

## 2016-12-13 MED ORDER — AZITHROMYCIN 250 MG PO TABS: ORAL_TABLET | ORAL | 1 refills | Status: DC

## 2016-12-13 MED ORDER — HYDROCODONE-HOMATROPINE 5-1.5 MG/5ML PO SYRP: 5.0000 mL | ORAL_SOLUTION | Freq: Four times a day (QID) | ORAL | 0 refills | Status: AC | PRN

## 2016-12-13 NOTE — Progress Notes (Signed)
Subjective:    Patient ID: BASHAR MILAM, male    DOB: [birth year], 76 y.o.   MRN: [id number]  HPI  Here with acute onset mild to mod 5-7 days ST, HA, general weakness and malaise, with prod cough greenish sputum and wheezing/sob/mild doe, but Pt denies chest pain, orthopnea, PND, increased LE swelling, palpitations, dizziness or syncope.  Was seen at [facility] with codeine cough med, prednisone and inhaler prn. Overall wheezing and sob/doe improved subjectively, but still with prod cough now even worse per pt.  Recent cxr neg for infiltrate   Pt denies polydipsia, polyuria Past Medical History:  Diagnosis Date  . Acoustic neuroma ([place]) 06/25/2015  . Allergic rhinitis 07/18/2014  . Arthritis   . BPH (benign prostatic hyperplasia)   . Carbuncle    recurrent MRSA carbuncles  . Cataract   . Diabetes mellitus    Type II  . Disk prolapse   . Glaucoma   . Hyperlipidemia   . Hypertension   . Male hypogonadism 07/16/2014  . Sinus bradycardia    chronic, asymptomatic  . Sinusitis 07/29/2012   Past Surgical History:  Procedure Laterality Date  . CATARACT EXTRACTION     x 2  . COLONOSCOPY    . POLYPECTOMY      reports that he quit smoking about 38 years ago. He has never used smokeless tobacco. He reports that he drinks about 2.5 oz of alcohol per week . He reports that he does not use drugs. family history includes Cancer in his brother and mother; Diabetes in his sister; Hyperlipidemia in his brother; Hypertension in his brother, mother, and sister; Stroke in his brother. No Known Allergies Current Outpatient Prescriptions on File Prior to Visit  Medication Sig Dispense Refill  . albuterol (PROVENTIL HFA;VENTOLIN HFA) 108 (90 Base) MCG/ACT inhaler Inhale 2 puffs into the lungs every 4 (four) hours as needed for wheezing or shortness of breath. 1 Inhaler 0  . aspirin 81 MG tablet Take 81 mg by mouth daily.     . benzonatate (TESSALON PERLES) 100 MG capsule 1-2 tab by mouth every 6 hrs as needed  for cough 60 capsule 0  . glucose blood (ONE TOUCH TEST STRIPS) test strip Use as directed once daily 100 each 11  . guaiFENesin-codeine (CHERATUSSIN AC) 100-10 MG/5ML syrup Take 5 mm by mouth every 6 hours when necessary cough. 120 mL 0  . lisinopril (PRINIVIL,ZESTRIL) 10 MG tablet Take 1 tablet (10 mg total) by mouth daily. 90 tablet 2  . metFORMIN (GLUCOPHAGE) 500 MG tablet TAKE 1 TABLET BY MOUTH 2 TIMES DAILY WITH A MEAL. 60 tablet 5  . ONE TOUCH LANCETS MISC Use to check blood sugar once daily (dx code 250.00) 100 each 11  . pravastatin (PRAVACHOL) 40 MG tablet TAKE 1 TABLET BY MOUTH EVERY DAY 30 tablet 5  . predniSONE (DELTASONE) 50 MG tablet 1 tab po on day 1, one tab po on day 2, 1/2 tab po on days 3 and 4 3 tablet 0  . terazosin (HYTRIN) 2 MG capsule Take 1 capsule (2 mg total) by mouth at bedtime. 30 capsule 0   No current facility-administered medications on file prior to visit.    Review of Systems  Constitutional: Negative for other unusual diaphoresis or sweats HENT: Negative for ear discharge or swelling Eyes: Negative for other worsening visual disturbances Respiratory: Negative for stridor or other swelling  Gastrointestinal: Negative for worsening distension or other blood Genitourinary: Negative for retention or other urinary change  Musculoskeletal: Negative for other MSK pain or swelling Skin: Negative for color change or other new lesions Neurological: Negative for worsening tremors and other numbness  Psychiatric/Behavioral: Negative for worsening agitation or other fatigue All other system neg per pt    Objective:   Physical Exam BP 118/78   Pulse 62   Temp 98.8 F (37.1 C) (Oral)   Ht 5\' 8"  (1.727 m)   Wt 201 lb (91.2 kg)   SpO2 99%   BMI 30.56 kg/m  VS noted, mild ill Constitutional: Pt appears in NAD HENT: Head: NCAT.  Right Ear: External ear normal.  Left Ear: External ear normal.  Eyes: . Pupils are equal, round, and reactive to light. Conjunctivae  and EOM are normal Nose: without d/c or deformity Bilat tm's with mild erythema.  Max sinus areas non tender.  Pharynx with mild erythema, no exudate Neck: Neck supple. Gross normal ROM Cardiovascular: Normal rate and regular rhythm.   Pulmonary/Chest: Effort normal and breath sounds decreased without rales and trace wheezing bilat.  Neurological: Pt is alert. At baseline orientation, motor grossly intact Skin: Skin is warm. No rashes, other new lesions, no LE edema Psychiatric: Pt behavior is normal without agitation  No other exam findings  CHEST  2 VIEW - summary IMPRESSION: No active cardiopulmonary disease.   On: 12/08/2016     Assessment & Plan:

## 2016-12-13 NOTE — Assessment & Plan Note (Signed)
stable overall by history and exam, recent data reviewed with pt, and pt to continue medical treatment as before,  to f/u any worsening symptoms or concerns Lab Results  Component Value Date   HGBA1C 6.2 11/01/2016  pt to call for onset polys or cbg > 200 with tx

## 2016-12-13 NOTE — Assessment & Plan Note (Signed)
Improving, to cont same tx with prednisone and inhaler prn

## 2016-12-13 NOTE — Assessment & Plan Note (Signed)
Mild to mod, c/w bronchitis vs pna, recent cxr neg for infiltrate but will be empirically for antibx course, cough med prn, to f/u any worsening symptoms or concerns

## 2016-12-13 NOTE — Patient Instructions (Signed)
Please take all new medication as prescribed - the antibiotic and the new cough medicine  Please continue all other medications as before, and refills have been done if requested.  Please have the pharmacy call with any other refills you may need.  Please continue your efforts at being more active, low cholesterol diet, and weight control.  Please keep your appointments with your specialists as you may have planned

## 2017-04-10 ENCOUNTER — Other Ambulatory Visit: Payer: Self-pay | Admitting: Internal Medicine

## 2017-05-09 ENCOUNTER — Other Ambulatory Visit (INDEPENDENT_AMBULATORY_CARE_PROVIDER_SITE_OTHER): Payer: Medicare Other

## 2017-05-09 DIAGNOSIS — E119 Type 2 diabetes mellitus without complications: Secondary | ICD-10-CM

## 2017-05-09 DIAGNOSIS — Z0000 Encounter for general adult medical examination without abnormal findings: Secondary | ICD-10-CM | POA: Diagnosis not present

## 2017-05-09 LAB — HEPATIC FUNCTION PANEL
ALT: 12 U/L (ref 0–53)
AST: 19 U/L (ref 0–37)
Albumin: 3.8 g/dL (ref 3.5–5.2)
Alkaline Phosphatase: 43 U/L (ref 39–117)
BILIRUBIN TOTAL: 0.3 mg/dL (ref 0.2–1.2)
Bilirubin, Direct: 0.1 mg/dL (ref 0.0–0.3)
TOTAL PROTEIN: 6.3 g/dL (ref 6.0–8.3)

## 2017-05-09 LAB — URINALYSIS, ROUTINE W REFLEX MICROSCOPIC
BILIRUBIN URINE: NEGATIVE
HGB URINE DIPSTICK: NEGATIVE
Ketones, ur: NEGATIVE
Leukocytes, UA: NEGATIVE
NITRITE: NEGATIVE
RBC / HPF: NONE SEEN (ref 0–?)
Specific Gravity, Urine: 1.015 (ref 1.000–1.030)
TOTAL PROTEIN, URINE-UPE24: NEGATIVE
URINE GLUCOSE: NEGATIVE
Urobilinogen, UA: 1 (ref 0.0–1.0)
pH: 8 (ref 5.0–8.0)

## 2017-05-09 LAB — CBC WITH DIFFERENTIAL/PLATELET
BASOS ABS: 0 10*3/uL (ref 0.0–0.1)
Basophils Relative: 1.2 % (ref 0.0–3.0)
EOS ABS: 0.1 10*3/uL (ref 0.0–0.7)
Eosinophils Relative: 3.3 % (ref 0.0–5.0)
HCT: 36.4 % — ABNORMAL LOW (ref 39.0–52.0)
Hemoglobin: 12.5 g/dL — ABNORMAL LOW (ref 13.0–17.0)
LYMPHS ABS: 1.6 10*3/uL (ref 0.7–4.0)
Lymphocytes Relative: 45.8 % (ref 12.0–46.0)
MCHC: 34.2 g/dL (ref 30.0–36.0)
MCV: 85.9 fl (ref 78.0–100.0)
Monocytes Absolute: 0.5 10*3/uL (ref 0.1–1.0)
Monocytes Relative: 14.7 % — ABNORMAL HIGH (ref 3.0–12.0)
NEUTROS ABS: 1.2 10*3/uL — AB (ref 1.4–7.7)
NEUTROS PCT: 35 % — AB (ref 43.0–77.0)
PLATELETS: 153 10*3/uL (ref 150.0–400.0)
RBC: 4.24 Mil/uL (ref 4.22–5.81)
RDW: 13.5 % (ref 11.5–15.5)
WBC: 3.4 10*3/uL — ABNORMAL LOW (ref 4.0–10.5)

## 2017-05-09 LAB — LIPID PANEL
CHOL/HDL RATIO: 6
CHOLESTEROL: 164 mg/dL (ref 0–200)
HDL: 27.8 mg/dL — AB (ref >39.00)

## 2017-05-09 LAB — PSA: PSA: 1.69 ng/mL (ref 0.10–4.00)

## 2017-05-09 LAB — BASIC METABOLIC PANEL
BUN: 15 mg/dL (ref 6–23)
CHLORIDE: 106 meq/L (ref 96–112)
CO2: 32 meq/L (ref 19–32)
CREATININE: 1.11 mg/dL (ref 0.40–1.50)
Calcium: 8.9 mg/dL (ref 8.4–10.5)
GFR: 82.43 mL/min (ref >60.00)
Glucose, Bld: 83 mg/dL (ref 70–99)
POTASSIUM: 4.5 meq/L (ref 3.5–5.1)
SODIUM: 143 meq/L (ref 135–145)

## 2017-05-09 LAB — LDL CHOLESTEROL, DIRECT: Direct LDL: 97 mg/dL

## 2017-05-09 LAB — MICROALBUMIN / CREATININE URINE RATIO
Creatinine,U: 94.3 mg/dL
Microalb Creat Ratio: 1.8 mg/g (ref 0.0–30.0)
Microalb, Ur: 1.7 mg/dL (ref 0.0–1.9)

## 2017-05-09 LAB — TSH: TSH: 1.39 u[IU]/mL (ref 0.35–4.50)

## 2017-05-09 LAB — HEMOGLOBIN A1C: HEMOGLOBIN A1C: 6.4 % (ref 4.6–6.5)

## 2017-05-10 ENCOUNTER — Ambulatory Visit: Payer: Medicare Other | Admitting: Internal Medicine

## 2017-05-10 ENCOUNTER — Encounter: Payer: Self-pay | Admitting: Internal Medicine

## 2017-05-10 VITALS — BP 124/86 | HR 60 | Temp 97.9°F | Ht 68.0 in | Wt 201.0 lb

## 2017-05-10 DIAGNOSIS — M7071 Other bursitis of hip, right hip: Secondary | ICD-10-CM | POA: Diagnosis not present

## 2017-05-10 DIAGNOSIS — Z0001 Encounter for general adult medical examination with abnormal findings: Secondary | ICD-10-CM

## 2017-05-10 DIAGNOSIS — M25512 Pain in left shoulder: Secondary | ICD-10-CM | POA: Diagnosis not present

## 2017-05-10 DIAGNOSIS — E119 Type 2 diabetes mellitus without complications: Secondary | ICD-10-CM

## 2017-05-10 DIAGNOSIS — R5381 Other malaise: Secondary | ICD-10-CM

## 2017-05-10 DIAGNOSIS — J069 Acute upper respiratory infection, unspecified: Secondary | ICD-10-CM | POA: Diagnosis not present

## 2017-05-10 MED ORDER — METFORMIN HCL ER 500 MG PO TB24: 500.0000 mg | ORAL_TABLET | Freq: Every day | ORAL | 3 refills | Status: DC

## 2017-05-10 MED ORDER — AZITHROMYCIN 250 MG PO TABS: ORAL_TABLET | ORAL | 1 refills | Status: DC

## 2017-05-10 NOTE — Progress Notes (Signed)
Subjective:    [name], male    DOB: [birth year], 77 y.o.   MRN: [id number]  HPI  Here for wellness and f/u;  Overall doing ok;  Pt denies Chest pain, worsening SOB, DOE, wheezing, orthopnea, PND, worsening LE edema, palpitations, dizziness or syncope.  Pt denies neurological change such as new headache, facial or extremity weakness.  Pt denies polydipsia, polyuria, or low sugar symptoms. Pt states overall good compliance with treatment and medications, good tolerability, and has been trying to follow appropriate diet.  Pt denies worsening depressive symptoms, suicidal ideation or panic. No fever, night sweats, wt loss, loss of appetite, or other constitutional symptoms.  Pt states good ability with ADL's, has low fall risk, home safety reviewed and adequate, no other significant changes in hearing or vision, and only occasionally active with exercise.   Also,  Here with 2-3 days acute onset fever, facial pain, pressure, headache, general weakness and malaise, and greenish d/c, with mild ST and cough. Also c/o 1 mo onset left shoulder pain, mild to mod, intermittent, without neck or other arm pain, worse to abduct and forward elevated. Also c/o right lateral hip pain and discomfort, sore to touch for several months with reduced overall activity and general weakening.  Worse to lie on the right side, worse to walk, but no giveaways or recent falls.   Past Medical History:  Diagnosis Date  . Acoustic neuroma ([place]) 06/25/2015  . Allergic rhinitis 07/18/2014  . Arthritis   . BPH (benign prostatic hyperplasia)   . Carbuncle    recurrent MRSA carbuncles  . Cataract   . Diabetes mellitus    Type II  . Disk prolapse   . Glaucoma   . Hyperlipidemia   . Hypertension   . Male hypogonadism 07/16/2014  . Sinus bradycardia    chronic, asymptomatic  . Sinusitis 07/29/2012   Past Surgical History:  Procedure Laterality Date  . CATARACT EXTRACTION     x 2  . COLONOSCOPY    .  POLYPECTOMY      reports that he quit smoking about 38 years ago. he has never used smokeless tobacco. He reports that he drinks about 2.5 oz of alcohol per week. He reports that he does not use drugs. family history includes Cancer in his brother and mother; Diabetes in his sister; Hyperlipidemia in his brother; Hypertension in his brother, mother, and sister; Stroke in his brother. No Known Allergies Current Outpatient Medications on File Prior to Visit  Medication Sig Dispense Refill  . albuterol (PROVENTIL HFA;VENTOLIN HFA) 108 (90 Base) MCG/ACT inhaler Inhale 2 puffs into the lungs every 4 (four) hours as needed for wheezing or shortness of breath. 1 Inhaler 0  . aspirin 81 MG tablet Take 81 mg by mouth daily.     Marland Kitchen glucose blood (ONE TOUCH TEST STRIPS) test strip Use as directed once daily 100 each 11  . lisinopril (PRINIVIL,ZESTRIL) 10 MG tablet Take 1 tablet (10 mg total) by mouth daily. 90 tablet 2  . ONE TOUCH LANCETS MISC Use to check blood sugar once daily (dx code 250.00) 100 each 11  . pravastatin (PRAVACHOL) 40 MG tablet TAKE 1 TABLET BY MOUTH EVERY DAY 30 tablet 2  . terazosin (HYTRIN) 2 MG capsule Take 1 capsule (2 mg total) by mouth at bedtime. 30 capsule 0   No current facility-administered medications on file prior to visit.    Review of Systems Constitutional: Negative for other unusual diaphoresis, sweats, appetite or  weight changes HENT: Negative for other worsening hearing loss, ear pain, facial swelling, mouth sores or neck stiffness.   Eyes: Negative for other worsening pain, redness or other visual disturbance.  Respiratory: Negative for other stridor or swelling Cardiovascular: Negative for other palpitations or other chest pain  Gastrointestinal: Negative for worsening diarrhea or loose stools, blood in stool, distention or other pain Genitourinary: Negative for hematuria, flank pain or other change in urine volume.  Musculoskeletal: Negative for myalgias or  other joint swelling.  Skin: Negative for other color change, or other wound or worsening drainage.  Neurological: Negative for other syncope or numbness. Hematological: Negative for other adenopathy or swelling Psychiatric/Behavioral: Negative for hallucinations, other worsening agitation, SI, self-injury, or new decreased concentration All other system neg per pt    Objective:   Physical Exam BP 124/86   Pulse 60   Temp 97.9 F (36.6 C) (Oral)   Ht 5\' 8"  (1.727 m)   Wt 201 lb (91.2 kg)   SpO2 97%   BMI 30.56 kg/m  VS noted, mild ill Constitutional: Pt is oriented to person, place, and time. Appears well-developed and well-nourished, in no significant distress and comfortable Head: Normocephalic and atraumatic  Eyes: Conjunctivae and EOM are normal. Pupils are equal, round, and reactive to light Bilat tm's with mild erythema.  Max sinus areas mild tender.  Pharynx with mild erythema, no exudate  Right Ear: External ear normal without discharge Left Ear: External ear normal without discharge Nose: Nose without discharge or deformity Mouth/Throat: Oropharynx is without other ulcerations and moist  Neck: Normal range of motion. Neck supple. No JVD present. No tracheal deviation present or significant neck LA or mass Cardiovascular: Normal rate, regular rhythm, normal heart sounds and intact distal pulses.   Pulmonary/Chest: WOB normal and breath sounds without rales or wheezing  Abdominal: Soft. Bowel sounds are normal. NT. No HSM  Musculoskeletal: Normal range of motion. Exhibits no edema except for left shoulder anterior soreness and reduced ROM to abduction or forward elevation; + mild to mod tender to right lateral hip without swelling or overlying skin change Lymphadenopathy: Has no other cervical adenopathy.  Neurological: Pt is alert and oriented to person, place, and time. Pt has normal reflexes. No cranial nerve deficit. Motor grossly intact, Gait intact Skin: Skin is warm  and dry. No rash noted or new ulcerations Psychiatric:  Has normal mood and affect. Behavior is normal without agitation No other exam findings  Lab Results  Component Value Date   WBC 3.4 (L) 05/09/2017   HGB 12.5 (L) 05/09/2017   HCT 36.4 (L) 05/09/2017   PLT 153.0 05/09/2017   GLUCOSE 83 05/09/2017   CHOL 164 05/09/2017   TRIG (H) 05/09/2017    411.0 Triglyceride is over 400; calculations on Lipids are invalid.   HDL 27.80 (L) 05/09/2017   LDLDIRECT 97.0 05/09/2017   LDLCALC 61 11/01/2016   ALT 12 05/09/2017   AST 19 05/09/2017   NA 143 05/09/2017   K 4.5 05/09/2017   CL 106 05/09/2017   CREATININE 1.11 05/09/2017   BUN 15 05/09/2017   CO2 32 05/09/2017   TSH 1.39 05/09/2017   PSA 1.69 05/09/2017   HGBA1C 6.4 05/09/2017   MICROALBUR 1.7 05/09/2017       Assessment & Plan:

## 2017-05-10 NOTE — Patient Instructions (Addendum)
OK to cut back on the metformin to one in the AM  Please take all new medication as prescribed - the antibiotic  Please continue all other medications as before, and refills have been done if requested.  Please have the pharmacy call with any other refills you may need.  Please continue your efforts at being more active, low cholesterol diet, and weight control.  You are otherwise up to date with prevention measures today.  Please keep your appointments with your specialists as you may have planned  You will be contacted regarding the referral for: Orthopedic  Please return in 6 months, or sooner if needed, with Lab testing done 3-5 days before

## 2017-05-14 ENCOUNTER — Encounter: Payer: Self-pay | Admitting: Internal Medicine

## 2017-05-14 NOTE — Assessment & Plan Note (Signed)
Mild to mod, likely bursitis, for ortho referral

## 2017-05-14 NOTE — Assessment & Plan Note (Addendum)
Lab Results  Component Value Date   HGBA1C 6.4 05/09/2017  stable overall by history and exam, recent data reviewed with pt, and pt now able to reduced metformin to 1 in am,  to f/u any worsening symptoms or concerns

## 2017-05-14 NOTE — Assessment & Plan Note (Signed)
Suspect bicipital or rot cuff issue, refer to ortho

## 2017-05-14 NOTE — Assessment & Plan Note (Addendum)
Mild to mod, for antibx course,  to f/u any worsening symptoms or concerns  In addition to the time spent performing CPE, I spent an additional 25 minutes face to face,in which greater than 50% of this time was spent in counseling and coordination of care for patient's acute illness as documented, including the differential dx, treatment, further evaluation and other management of acute upper resp infection, left shoulder pain, right hip pain, DM and deconditioning

## 2017-05-14 NOTE — Assessment & Plan Note (Signed)
Encouraged pt to be more active, declines PT evaluation

## 2017-05-14 NOTE — Assessment & Plan Note (Signed)

## 2017-06-09 ENCOUNTER — Other Ambulatory Visit: Payer: Self-pay | Admitting: Internal Medicine

## 2017-09-08 ENCOUNTER — Other Ambulatory Visit: Payer: Self-pay | Admitting: Internal Medicine

## 2017-09-18 ENCOUNTER — Other Ambulatory Visit: Payer: Self-pay | Admitting: Internal Medicine

## 2017-11-01 ENCOUNTER — Telehealth: Payer: Self-pay | Admitting: Internal Medicine

## 2017-11-01 NOTE — Telephone Encounter (Signed)
Copied from [place] [id number]. Topic: Quick Communication - See Telephone Encounter >> Nov 01, 2017  3:11 PM Mylinda Latina, NT wrote: CRM for notification. See Telephone encounter for: 11/01/17. Patient called and states he needs a refill of pravastatin (PRAVACHOL) 40 MG tablet   [id number] [address], [place] [phone] (Phone) [phone] (Fax)

## 2017-11-02 MED ORDER — PRAVASTATIN SODIUM 40 MG PO TABS: 40.0000 mg | ORAL_TABLET | Freq: Every day | ORAL | 0 refills | Status: DC

## 2017-11-07 ENCOUNTER — Other Ambulatory Visit (INDEPENDENT_AMBULATORY_CARE_PROVIDER_SITE_OTHER): Payer: Medicare Other

## 2017-11-07 DIAGNOSIS — E119 Type 2 diabetes mellitus without complications: Secondary | ICD-10-CM | POA: Diagnosis not present

## 2017-11-07 LAB — LIPID PANEL
CHOL/HDL RATIO: 5
Cholesterol: 177 mg/dL (ref 0–200)
HDL: 36.1 mg/dL — ABNORMAL LOW (ref >39.00)
NONHDL: 140.61
TRIGLYCERIDES: 325 mg/dL — AB (ref 0.0–149.0)
VLDL: 65 mg/dL — ABNORMAL HIGH (ref 0.0–40.0)

## 2017-11-07 LAB — BASIC METABOLIC PANEL
BUN: 17 mg/dL (ref 6–23)
CHLORIDE: 108 meq/L (ref 96–112)
CO2: 27 meq/L (ref 19–32)
CREATININE: 0.99 mg/dL (ref 0.40–1.50)
Calcium: 9.2 mg/dL (ref 8.4–10.5)
GFR: 93.95 mL/min (ref >60.00)
GLUCOSE: 153 mg/dL — AB (ref 70–99)
Potassium: 3.6 mEq/L (ref 3.5–5.1)
Sodium: 141 mEq/L (ref 135–145)

## 2017-11-07 LAB — HEPATIC FUNCTION PANEL
ALBUMIN: 3.9 g/dL (ref 3.5–5.2)
ALK PHOS: 44 U/L (ref 39–117)
ALT: 11 U/L (ref 0–53)
AST: 16 U/L (ref 0–37)
BILIRUBIN DIRECT: 0 mg/dL (ref 0.0–0.3)
Total Bilirubin: 0.4 mg/dL (ref 0.2–1.2)
Total Protein: 6.5 g/dL (ref 6.0–8.3)

## 2017-11-07 LAB — HEMOGLOBIN A1C: HEMOGLOBIN A1C: 6.3 % (ref 4.6–6.5)

## 2017-11-07 LAB — LDL CHOLESTEROL, DIRECT: LDL DIRECT: 121 mg/dL

## 2017-11-08 ENCOUNTER — Encounter: Payer: Self-pay | Admitting: Internal Medicine

## 2017-11-08 ENCOUNTER — Ambulatory Visit: Payer: Medicare Other | Admitting: Internal Medicine

## 2017-11-08 VITALS — BP 122/84 | HR 62 | Temp 97.9°F | Ht 68.0 in | Wt 194.0 lb

## 2017-11-08 DIAGNOSIS — E119 Type 2 diabetes mellitus without complications: Secondary | ICD-10-CM | POA: Diagnosis not present

## 2017-11-08 DIAGNOSIS — M79604 Pain in right leg: Secondary | ICD-10-CM | POA: Diagnosis not present

## 2017-11-08 DIAGNOSIS — M79605 Pain in left leg: Secondary | ICD-10-CM

## 2017-11-08 DIAGNOSIS — I10 Essential (primary) hypertension: Secondary | ICD-10-CM

## 2017-11-08 DIAGNOSIS — E785 Hyperlipidemia, unspecified: Secondary | ICD-10-CM | POA: Diagnosis not present

## 2017-11-08 DIAGNOSIS — Z0000 Encounter for general adult medical examination without abnormal findings: Secondary | ICD-10-CM

## 2017-11-08 MED ORDER — GABAPENTIN 100 MG PO CAPS
100.0000 mg | ORAL_CAPSULE | Freq: Three times a day (TID) | ORAL | 5 refills | Status: DC
Start: 2017-11-08 — End: 2017-11-30

## 2017-11-08 NOTE — Assessment & Plan Note (Signed)
To restart statin,  to f/u any worsening symptoms or concerns

## 2017-11-08 NOTE — Assessment & Plan Note (Signed)
stable overall by history and exam, recent data reviewed with pt, and pt to continue medical treatment as before,  to f/u any worsening symptoms or concerns  

## 2017-11-08 NOTE — Assessment & Plan Note (Signed)
?   neuritic vs other, exam benign, for gabapentin trial, declines MRI for lumbar

## 2017-11-08 NOTE — Assessment & Plan Note (Signed)
stable overall by history and exam, recent data reviewed with pt, and pt to continue medical treatment as before,  to f/u any worsening symptoms or concerns Lab Results  Component Value Date   HGBA1C 6.3 11/07/2017

## 2017-11-08 NOTE — Patient Instructions (Addendum)
Please take all new medication as prescribed - the gabapentin  Please call if you change your mind about the MRI for the lower back  Please continue all other medications as before, and refills have been done if requested.  Please have the pharmacy call with any other refills you may need.  Please continue your efforts at being more active, low cholesterol diet, and weight control.  Please keep your appointments with your specialists as you may have planned  Please return in 6 months, or sooner if needed, with Lab testing done 3-5 days before

## 2017-11-08 NOTE — Progress Notes (Signed)
Subjective:    [name], male    DOB: 1940/06/03, 77 y.o.   MRN: [id number]  HPI  Here to f/u; overall doing ok,  Pt denies chest pain, increasing sob or doe, wheezing, orthopnea, PND, increased LE swelling, palpitations, dizziness or syncope.  Pt denies new neurological symptoms such as new headache, or facial or extremity weakness or numbness.  Pt denies polydipsia, polyuria, or low sugar episode.  Pt states overall good compliance with meds, mostly trying to follow appropriate diet, with wt overall stable,  but little exercise however.  Out of statin lately, ran out.  Right hip pain/tenderness improved.  Also c/o bilat leg pain and weakness with a crawling sensation, but no gait change or falls.   Wt Readings from Last 3 Encounters:  11/08/17 194 lb (88 kg)  05/10/17 201 lb (91.2 kg)  12/13/16 201 lb (91.2 kg)   BP Readings from Last 3 Encounters:  11/08/17 122/84  05/10/17 124/86  12/13/16 118/78   Past Medical History:  Diagnosis Date  . Acoustic neuroma ([place]) 06/25/2015  . Allergic rhinitis 07/18/2014  . Arthritis   . BPH (benign prostatic hyperplasia)   . Carbuncle    recurrent MRSA carbuncles  . Cataract   . Diabetes mellitus    Type II  . Disk prolapse   . Glaucoma   . Hyperlipidemia   . Hypertension   . Male hypogonadism 07/16/2014  . Sinus bradycardia    chronic, asymptomatic  . Sinusitis 07/29/2012   Past Surgical History:  Procedure Laterality Date  . CATARACT EXTRACTION     x 2  . COLONOSCOPY    . POLYPECTOMY      reports that he quit smoking about 39 years ago. He has never used smokeless tobacco. He reports that he drinks about 3.0 oz of alcohol per week. He reports that he does not use drugs. family history includes Cancer in his brother and mother; Diabetes in his sister; Hyperlipidemia in his brother; Hypertension in his brother, mother, and sister; Stroke in his brother. No Known Allergies Current Outpatient Medications on File Prior  to Visit  Medication Sig Dispense Refill  . albuterol (PROVENTIL HFA;VENTOLIN HFA) 108 (90 Base) MCG/ACT inhaler Inhale 2 puffs into the lungs every 4 (four) hours as needed for wheezing or shortness of breath. 1 Inhaler 0  . aspirin 81 MG tablet Take 81 mg by mouth daily.     Marland Kitchen glucose blood (ONE TOUCH TEST STRIPS) test strip Use as directed once daily 100 each 11  . lisinopril (PRINIVIL,ZESTRIL) 10 MG tablet TAKE 1 TABLET BY MOUTH EVERY DAY 30 tablet 8  . metFORMIN (GLUCOPHAGE-XR) 500 MG 24 hr tablet Take 1 tablet (500 mg total) by mouth daily with breakfast. 90 tablet 3  . ONE TOUCH LANCETS MISC Use to check blood sugar once daily (dx code 250.00) 100 each 11  . pravastatin (PRAVACHOL) 40 MG tablet Take 1 tablet (40 mg total) by mouth daily. 30 tablet 0  . terazosin (HYTRIN) 2 MG capsule Take 1 capsule (2 mg total) by mouth at bedtime. 30 capsule 0   No current facility-administered medications on file prior to visit.    Review of Systems  Constitutional: Negative for other unusual diaphoresis or sweats HENT: Negative for ear discharge or swelling Eyes: Negative for other worsening visual disturbances Respiratory: Negative for stridor or other swelling  Gastrointestinal: Negative for worsening distension or other blood Genitourinary: Negative for retention or other urinary change Musculoskeletal: Negative  for other MSK pain or swelling Skin: Negative for color change or other new lesions Neurological: Negative for worsening tremors and other numbness  Psychiatric/Behavioral: Negative for worsening agitation or other fatigue All other system neg per pt    Objective:   Physical Exam BP 122/84   Pulse 62   Temp 97.9 F (36.6 C) (Oral)   Ht 5\' 8"  (1.727 m)   Wt 194 lb (88 kg)   SpO2 97%   BMI 29.50 kg/m  VS noted,  Constitutional: Pt appears in NAD HENT: Head: NCAT.  Right Ear: External ear normal.  Left Ear: External ear normal.  Eyes: . Pupils are equal, round, and reactive  to light. Conjunctivae and EOM are normal Nose: without d/c or deformity Neck: Neck supple. Gross normal ROM Cardiovascular: Normal rate and regular rhythm.   Pulmonary/Chest: Effort normal and breath sounds without rales or wheezing.  Abd:  Soft, NT, ND, + BS, no organomegaly Neurological: Pt is alert. At baseline orientation, motor grossly intact Skin: Skin is warm. No rashes, other new lesions, no LE edema Psychiatric: Pt behavior is normal without agitation  No other exam findings  Lab Results  Component Value Date   WBC 3.4 (L) 05/09/2017   HGB 12.5 (L) 05/09/2017   HCT 36.4 (L) 05/09/2017   PLT 153.0 05/09/2017   GLUCOSE 153 (H) 11/07/2017   CHOL 177 11/07/2017   TRIG 325.0 (H) 11/07/2017   HDL 36.10 (L) 11/07/2017   LDLDIRECT 121.0 11/07/2017   LDLCALC 61 11/01/2016   ALT 11 11/07/2017   AST 16 11/07/2017   NA 141 11/07/2017   K 3.6 11/07/2017   CL 108 11/07/2017   CREATININE 0.99 11/07/2017   BUN 17 11/07/2017   CO2 27 11/07/2017   TSH 1.39 05/09/2017   PSA 1.69 05/09/2017   HGBA1C 6.3 11/07/2017   MICROALBUR 1.7 05/09/2017    MRI LUMBAR SPINE WITHOUT CONTRAST   Technique:  Multiplanar and multiecho pulse sequences of the lumbar spine were obtained without intravenous contrast.   Comparison: No comparison MR.  Prior plain film examination 11/11/2008.   Findings: Last fully open disc space is labeled L5-S1.  Present examination incorporates from T11-T12 disc space through the lower sacrum.  Conus L1-2.  Secondary to motion, evaluation of the intrinsic distal cord / conus signal abnormality is limited.   Fatty changes left paraspinal musculature L1 level.  Etiology indeterminate.   Congenitally narrowed spinal canal secondary to short pedicles. T11-12, T12-L1 and L1-2 otherwise unremarkable.   L2-3:  Prominent Schmorl's node deformity anterior inferior aspect of the L2 vertebra.  Moderate bulge.  Minimal retrolisthesis L2 with spur.  Mild spinal  stenosis and bilateral foraminal narrowing.   L3-4:  Minimal bulge.  Very mild spinal stenosis and mild bilateral foraminal narrowing.   L4-5:  Facet joint degenerative changes.  Disc degeneration with moderate bulge and small to moderate sized central disc protrusion. Moderate compression of the ventral aspect of thecal sac and mild bilateral foraminal narrowing.   L5-S1:  Bilateral L5 pars defects.  Prominent disc degeneration. Bulge with broad-based protrusion and spur extending into the neural foramen bilaterally with prominent bilateral foraminal narrowing and mass effect upon the exiting L5 nerve roots. Moderate right sub articular lateral recess stenosis.   Mild sacroiliac joint degenerative changes.   IMPRESSION: Exam is slightly motion degraded limiting evaluation of distal cord and conus.   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   L4-5 moderate bulge and small to moderate sized central  disc protrusion.  Moderate compression of the ventral aspect of thecal sac and mild bilateral foraminal narrowing.   L5-S1:  Bilateral L5 pars defects.  Bulge with broad-based protrusion and spur extending into the neural foramen bilaterally with prominent bilateral foraminal narrowing and mass effect upon the exiting L5 nerve roots.  Moderate right sub articular lateral recess stenosis.   Please see above.   This is a call report. Opal Sidles).  Provider: Clearnce Sorrel    Assessment & Plan:

## 2017-11-30 ENCOUNTER — Other Ambulatory Visit: Payer: Self-pay | Admitting: Internal Medicine

## 2017-12-29 ENCOUNTER — Encounter (HOSPITAL_COMMUNITY): Payer: Self-pay

## 2017-12-29 ENCOUNTER — Ambulatory Visit (HOSPITAL_COMMUNITY)
Admission: EM | Admit: 2017-12-29 | Discharge: 2017-12-29 | Disposition: A | Discharge status: Home / Self Care | Payer: Medicare Other | Attending: Internal Medicine | Admitting: Internal Medicine

## 2017-12-29 DIAGNOSIS — M5410 Radiculopathy, site unspecified: Secondary | ICD-10-CM

## 2017-12-29 DIAGNOSIS — M25552 Pain in left hip: Secondary | ICD-10-CM

## 2017-12-29 DIAGNOSIS — M5432 Sciatica, left side: Secondary | ICD-10-CM

## 2017-12-29 MED ORDER — PREDNISONE 20 MG PO TABS: ORAL_TABLET | ORAL | 0 refills | Status: DC

## 2017-12-29 NOTE — ED Triage Notes (Addendum)
Pt states she has left hip pain and pain going down his left leg.

## 2017-12-29 NOTE — ED Provider Notes (Signed)
MRN: [id number] DOB: 05-Feb-1941  Subjective:   Jonathan Lewis is a 77 y.o. male presenting for 1 week history of left low buttock pain that radiates down into his left leg into his foot.  Imaging shows history of arthritis and bulging disks.  He does not have an orthopedist or neurosurgeon.  Has DM, blood sugar checks is in low 100s.   No current facility-administered medications for this encounter.   Current Outpatient Medications:  .  albuterol (PROVENTIL HFA;VENTOLIN HFA) 108 (90 Base) MCG/ACT inhaler, Inhale 2 puffs into the lungs every 4 (four) hours as needed for wheezing or shortness of breath., Disp: 1 Inhaler, Rfl: 0 .  aspirin 81 MG tablet, Take 81 mg by mouth daily. , Disp: , Rfl:  .  gabapentin (NEURONTIN) 100 MG capsule, TAKE 1 CAPSULE BY MOUTH THREE TIMES A DAY, Disp: 270 capsule, Rfl: 2 .  glucose blood (ONE TOUCH TEST STRIPS) test strip, Use as directed once daily, Disp: 100 each, Rfl: 11 .  lisinopril (PRINIVIL,ZESTRIL) 10 MG tablet, TAKE 1 TABLET BY MOUTH EVERY DAY, Disp: 30 tablet, Rfl: 8 .  metFORMIN (GLUCOPHAGE-XR) 500 MG 24 hr tablet, Take 1 tablet (500 mg total) by mouth daily with breakfast., Disp: 90 tablet, Rfl: 3 .  ONE TOUCH LANCETS MISC, Use to check blood sugar once daily (dx code 250.00), Disp: 100 each, Rfl: 11 .  pravastatin (PRAVACHOL) 40 MG tablet, TAKE 1 TABLET BY MOUTH EVERY DAY, Disp: 30 tablet, Rfl: 0 .  terazosin (HYTRIN) 2 MG capsule, Take 1 capsule (2 mg total) by mouth at bedtime., Disp: 30 capsule, Rfl: 0   No Known Allergies  Past Medical History:  Diagnosis Date  . Acoustic neuroma ([place]) 06/25/2015  . Allergic rhinitis 07/18/2014  . Arthritis   . BPH (benign prostatic hyperplasia)   . Carbuncle    recurrent MRSA carbuncles  . Cataract   . Diabetes mellitus    Type II  . Disk prolapse   . Glaucoma   . Hyperlipidemia   . Hypertension   . Male hypogonadism 07/16/2014  . Sinus bradycardia    chronic, asymptomatic  . Sinusitis 07/29/2012       Past Surgical History:  Procedure Laterality Date  . CATARACT EXTRACTION     x 2  . COLONOSCOPY    . POLYPECTOMY      Objective:   Vitals: BP (!) 147/86   Pulse 60   Temp 97.8 F (36.6 C) (Oral)   Resp 18   Wt 193 lb (87.5 kg)   SpO2 100%   BMI 29.35 kg/m   Physical Exam  Constitutional: He is oriented to person, place, and time. He appears well-developed and well-nourished.  Cardiovascular: Normal rate.  Pulmonary/Chest: Effort normal.  Musculoskeletal:       Lumbar back: He exhibits decreased range of motion (flexion, extension) and tenderness (by report only, not appreciated on exam). He exhibits no bony tenderness, no swelling, no edema, no deformity and no spasm.       Back:  Neurological: He is alert and oriented to person, place, and time. He displays normal reflexes.    Assessment and Plan :   Left hip pain  Sciatica of left side  Radicular pain  To address sciatica will use steroid course.  Counseled patient on diagnosis of sciatica but also is probably being affected by his arthritis and history of bulging this.  Recommended patient try to obtain referral through his PCP for an orthopedist or neurosurgeon.  Jaynee Eagles, PA-C 12/31/17 1137

## 2017-12-31 ENCOUNTER — Encounter (HOSPITAL_COMMUNITY): Payer: Self-pay | Admitting: Urgent Care

## 2018-01-02 ENCOUNTER — Ambulatory Visit: Payer: Medicare Other | Admitting: Internal Medicine

## 2018-01-02 ENCOUNTER — Encounter: Payer: Self-pay | Admitting: Internal Medicine

## 2018-01-02 VITALS — BP 142/90 | HR 67 | Temp 98.5°F | Ht 68.0 in | Wt 190.0 lb

## 2018-01-02 DIAGNOSIS — M5416 Radiculopathy, lumbar region: Secondary | ICD-10-CM | POA: Insufficient documentation

## 2018-01-02 DIAGNOSIS — I10 Essential (primary) hypertension: Secondary | ICD-10-CM | POA: Diagnosis not present

## 2018-01-02 DIAGNOSIS — E119 Type 2 diabetes mellitus without complications: Secondary | ICD-10-CM

## 2018-01-02 MED ORDER — TIZANIDINE HCL 2 MG PO CAPS: 2.0000 mg | ORAL_CAPSULE | Freq: Three times a day (TID) | ORAL | 1 refills | Status: DC | PRN

## 2018-01-02 MED ORDER — HYDROCODONE-ACETAMINOPHEN 5-325 MG PO TABS: 1.0000 | ORAL_TABLET | Freq: Four times a day (QID) | ORAL | 0 refills | Status: DC | PRN

## 2018-01-02 NOTE — Assessment & Plan Note (Signed)
Not improved with predpac per UC, and with neuro changes acutely, for MRI and refer [place] [facility] per pt request, also pain control and muscle relaxer prn

## 2018-01-02 NOTE — Assessment & Plan Note (Signed)
stable overall by history and exam, recent data reviewed with pt, and pt to continue medical treatment as before,  to f/u any worsening symptoms or concerns. Lab Results  Component Value Date   HGBA1C 6.3 11/07/2017

## 2018-01-02 NOTE — Patient Instructions (Signed)
Please take all new medication as prescribed - the pain medication and the muscle relaxer as needed  OK to finish the prednisone  Please continue all other medications as before, and refills have been done if requested.  Please have the pharmacy call with any other refills you may need.  Please continue your efforts at being more active, low cholesterol diet, and weight control.  Please keep your appointments with your specialists as you may have planned  You will be contacted regarding the referral for: [facility]) and MRI for the lower back

## 2018-01-02 NOTE — Progress Notes (Signed)
Subjective:    [name], male    DOB: Sep 08, 1940, 77 y.o.   MRN: [id number]  HPI  Here to c/o acute onset 1 wk left lower back pain with radiation to the distal LLE, mod to severe, constant, assoc with numbness to distal LLE above the ankle anteriorly, with possible mild intermittent weakness, No falls. Worse after standing up, worse to walk, every movement seems to aggrevate.  Pt denies bowel or bladder change, fever, wt loss,  gait change or falls, though has not had BM in 1 wk but not sure if this is related as has chronic issue with this. Denies urinary symptoms such as dysuria, frequency, urgency, flank pain, hematuria or n/v, fever, chills, except for slow and stinging at times.  No prior hs of current complaints. Has hx of c spine DDD,  Last MRI LS spine with DDD and L5-s1 bialt foraminal narrowing and possible impingement of exiting roots. Has hx of remote ESI for lumbar years ago.  Pt denies chest pain, increased sob or doe, wheezing, orthopnea, PND, increased LE swelling, palpitations, dizziness or syncope.Pt denies new neurological symptoms such as new headache, or facial or extremity weakness or numbness except for some persistent numbness to LUE as well more chronic, also has known hx cervical DDD Past Medical History:  Diagnosis Date  . Acoustic neuroma ([place]) 06/25/2015  . Allergic rhinitis 07/18/2014  . Arthritis   . BPH (benign prostatic hyperplasia)   . Carbuncle    recurrent MRSA carbuncles  . Cataract   . Diabetes mellitus    Type II  . Disk prolapse   . Glaucoma   . Hyperlipidemia   . Hypertension   . Male hypogonadism 07/16/2014  . Sinus bradycardia    chronic, asymptomatic  . Sinusitis 07/29/2012   Past Surgical History:  Procedure Laterality Date  . CATARACT EXTRACTION     x 2  . COLONOSCOPY    . POLYPECTOMY      reports that he quit smoking about 39 years ago. He has never used smokeless tobacco. He reports that he drinks about 5.0 standard  drinks of alcohol per week. He reports that he does not use drugs. family history includes Cancer in his brother and mother; Diabetes in his sister; Hyperlipidemia in his brother; Hypertension in his brother, mother, and sister; Stroke in his brother. No Known Allergies Current Outpatient Medications on File Prior to Visit  Medication Sig Dispense Refill  . albuterol (PROVENTIL HFA;VENTOLIN HFA) 108 (90 Base) MCG/ACT inhaler Inhale 2 puffs into the lungs every 4 (four) hours as needed for wheezing or shortness of breath. 1 Inhaler 0  . aspirin 81 MG tablet Take 81 mg by mouth daily.     Marland Kitchen gabapentin (NEURONTIN) 100 MG capsule TAKE 1 CAPSULE BY MOUTH THREE TIMES A DAY 270 capsule 2  . glucose blood (ONE TOUCH TEST STRIPS) test strip Use as directed once daily 100 each 11  . lisinopril (PRINIVIL,ZESTRIL) 10 MG tablet TAKE 1 TABLET BY MOUTH EVERY DAY 30 tablet 8  . metFORMIN (GLUCOPHAGE-XR) 500 MG 24 hr tablet Take 1 tablet (500 mg total) by mouth daily with breakfast. 90 tablet 3  . ONE TOUCH LANCETS MISC Use to check blood sugar once daily (dx code 250.00) 100 each 11  . pravastatin (PRAVACHOL) 40 MG tablet TAKE 1 TABLET BY MOUTH EVERY DAY 30 tablet 0  . predniSONE (DELTASONE) 20 MG tablet Take 2 tablets daily with breakfast. 10 tablet 0  .  terazosin (HYTRIN) 2 MG capsule Take 1 capsule (2 mg total) by mouth at bedtime. 30 capsule 0   No current facility-administered medications on file prior to visit.    Review of Systems  Constitutional: Negative for other unusual diaphoresis or sweats HENT: Negative for ear discharge or swelling Eyes: Negative for other worsening visual disturbances Respiratory: Negative for stridor or other swelling  Gastrointestinal: Negative for worsening distension or other blood Genitourinary: Negative for retention or other urinary change Musculoskeletal: Negative for other MSK pain or swelling Skin: Negative for color change or other new lesions Neurological:  Negative for worsening tremors and other numbness  Psychiatric/Behavioral: Negative for worsening agitation or other fatigue All other system neg per pt    Objective:   Physical Exam BP (!) 142/90   Pulse 67   Temp 98.5 F (36.9 C) (Oral)   Ht 5\' 8"  (1.727 m)   Wt 190 lb (86.2 kg)   SpO2 96%   BMI 28.89 kg/m  VS noted,  Constitutional: Pt appears in NAD HENT: Head: NCAT.  Right Ear: External ear normal.  Left Ear: External ear normal.  Eyes: . Pupils are equal, round, and reactive to light. Conjunctivae and EOM are normal Nose: without d/c or deformity Neck: Neck supple. Gross normal ROM Cardiovascular: Normal rate and regular rhythm.   Pulmonary/Chest: Effort normal and breath sounds without rales or wheezing.  Abd:  Soft, NT, ND, + BS, no organomegaly Spine nontender in midline Does have bilat left > right msk spasm tender as well Neurological: Pt is alert. At baseline orientation, motor grossly intact exept 4/5 LLE with decreased sensation to LT throughout and reduce left patellar reflex Skin: Skin is warm. No rashes, other new lesions, no LE edema Psychiatric: Pt behavior is normal without agitation  No other exam findings    Assessment & Plan:

## 2018-01-02 NOTE — Assessment & Plan Note (Signed)
Mild elevated today likely reactive, ok to cont same tx

## 2018-01-05 ENCOUNTER — Other Ambulatory Visit: Payer: Self-pay | Admitting: Internal Medicine

## 2018-01-08 ENCOUNTER — Ambulatory Visit: Payer: Medicare Other | Admitting: Family Medicine

## 2018-01-08 ENCOUNTER — Encounter: Payer: Self-pay | Admitting: Family Medicine

## 2018-01-08 DIAGNOSIS — M5416 Radiculopathy, lumbar region: Secondary | ICD-10-CM

## 2018-01-08 NOTE — Progress Notes (Signed)
Jonathan Lewis - 77 y.o. male MRN [id number]  Date of birth: 06-07-1940  SUBJECTIVE:  Including CC & ROS.  Chief Complaint  Patient presents with  . Leg Pain    Jonathan Lewis is a 77 y.o. male that is presenting with left hip pain. He recalls lifting a TV to set it on the ground and noticed the pain . ngoing for one week. Pain is located at his hip and radiates down his leg. Pain is described as a burning sensation. Denies tingling or numbness. Pain is exacerbated during walking or sitting. He has been trying to use a heating pad. Denies surgeries or injuries. He was seen at Urgent care completed a course of Prednisone with no improvement.   Review of lumbar spine MRI from 2011 shows disc bulge at L4-5 and bilateral pars defect at L5  Review of Systems  Constitutional: Negative for fever.  HENT: Negative for congestion.   Respiratory: Negative for cough.   Cardiovascular: Negative for chest pain.  Gastrointestinal: Negative for abdominal pain.  Musculoskeletal: Positive for arthralgias.  Skin: Negative for color change.  Neurological: Negative for weakness.  Hematological: Negative for adenopathy.  Psychiatric/Behavioral: Negative for agitation.    HISTORY: Past Medical, Surgical, Social, and Family History Reviewed & Updated per EMR.   Pertinent Historical Findings include:  Past Medical History:  Diagnosis Date  . Acoustic neuroma ([place]) 06/25/2015  . Allergic rhinitis 07/18/2014  . Arthritis   . BPH (benign prostatic hyperplasia)   . Carbuncle    recurrent MRSA carbuncles  . Cataract   . Diabetes mellitus    Type II  . Disk prolapse   . Glaucoma   . Hyperlipidemia   . Hypertension   . Male hypogonadism 07/16/2014  . Sinus bradycardia    chronic, asymptomatic  . Sinusitis 07/29/2012    Past Surgical History:  Procedure Laterality Date  . CATARACT EXTRACTION     x 2  . COLONOSCOPY    . POLYPECTOMY      No Known Allergies  Family History  Problem Relation  Age of Onset  . Cancer Mother   . Hypertension Mother   . Diabetes Sister   . Cancer Brother        colon  . Hypertension Brother   . Hyperlipidemia Brother   . Stroke Brother   . Hypertension Sister   . Colon cancer Neg Hx   . Heart attack Neg Hx   . Sudden death Neg Hx      Social History   Socioeconomic History  . Marital status: Married    Spouse name: Not on file  . Number of children: 7  . Years of education: 7  . Highest education level: Not on file  Occupational History  . Occupation: Mining engineer: RETIRED  Social Needs  . Financial resource strain: Not on file  . Food insecurity:    Worry: Not on file    Inability: Not on file  . Transportation needs:    Medical: Not on file    Non-medical: Not on file  Tobacco Use  . Smoking status: Former Smoker    Last attempt to quit: 10/03/1978    Years since quitting: 39.2  . Smokeless tobacco: Never Used  Substance and Sexual Activity  . Alcohol use: Yes    Alcohol/week: 5.0 standard drinks    Types: 5 Standard drinks or equivalent per week    Comment: pint, occasion beer  . Drug use: No  .  Sexual activity: Yes    Partners: Female  Lifestyle  . Physical activity:    Days per week: Not on file    Minutes per session: Not on file  . Stress: Not on file  Relationships  . Social connections:    Talks on phone: Not on file    Gets together: Not on file    Attends religious service: Not on file    Active member of club or organization: Not on file    Attends meetings of clubs or organizations: Not on file    Relationship status: Not on file  . Intimate partner violence:    Fear of current or ex partner: Not on file    Emotionally abused: Not on file    Physically abused: Not on file    Forced sexual activity: Not on file  Other Topics Concern  . Not on file  Social History Narrative   9th grade.  Married '60. [name] 3 sons -'4 '64, '66; 4 dtrs -'60, '62, '61, '65; 15 grands, 25 great-grands.    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   ACD- discussed living will and HCPOA (July '14) provided packet.            PHYSICAL EXAM:  VS: BP 138/78   Pulse 66   Ht 5\' 8"  (1.727 m)   Wt 190 lb (86.2 kg)   SpO2 98%   BMI 28.89 kg/m  Physical Exam Gen: NAD, alert, cooperative with exam, well-appearing ENT: normal lips, normal nasal mucosa,  Eye: normal EOM, normal conjunctiva and lids CV:  no edema, +2 pedal pulses   Resp: no accessory muscle use, non-labored,  Skin: no rashes, no areas of induration  Neuro: normal tone, normal sensation to touch Psych:  normal insight, alert and oriented MSK:  Back Exam:  Inspection: Unremarkable  Palpable tenderness: None. Leg strength: Quad: 5/5 Hamstring: 5/5 Hip flexor: Strength at foot: Plantar-flexion: 5/5 Dorsi-flexion: 5/5 Eversion: 5/5 Inversion: 5/5  Reflexes: 2+ at both patellar tendons Gait unremarkable. SLR laying: Negative  XSLR laying: Negative  Neurovascularly intact      ASSESSMENT & PLAN:   Left lumbar radiculopathy Has suggestion of sciatica. Mild to no improvement with prednisone. Has a history of pars defect and reason for compression on prior MRI from 2011.  - restart gabapentin  - counseled on HEP  - counseled on supportive care - has an MRI ordered  - may benefit from a xray to evaluate pars defect or any slippage in extension and flexion views.

## 2018-01-08 NOTE — Patient Instructions (Signed)
Nice to meet you  Please to restart the gabapentin with 1 pill at night.  You can increase this to 2-3 times daily.  You can take up to 300 mg if you are not having improvement. Please try the Vimovo and let me know if you have any improvement of your symptoms. Please try to exercise that provided. Please let me know if you are not having improvements and we may need to try physical therapy.

## 2018-01-09 NOTE — Assessment & Plan Note (Signed)
Has suggestion of sciatica. Mild to no improvement with prednisone. Has a history of pars defect and reason for compression on prior MRI from 2011.  - restart gabapentin  - counseled on HEP  - counseled on supportive care - has an MRI ordered  - may benefit from a xray to evaluate pars defect or any slippage in extension and flexion views.

## 2018-01-19 ENCOUNTER — Other Ambulatory Visit: Payer: Self-pay | Admitting: Internal Medicine

## 2018-01-20 ENCOUNTER — Ambulatory Visit
Admission: RE | Admit: 2018-01-20 | Discharge: 2018-01-20 | Disposition: A | Discharge status: Home / Self Care | Payer: Medicare Other | Source: Ambulatory Visit | Attending: Internal Medicine | Admitting: Internal Medicine

## 2018-01-20 DIAGNOSIS — M5416 Radiculopathy, lumbar region: Secondary | ICD-10-CM

## 2018-01-22 ENCOUNTER — Encounter: Payer: Self-pay | Admitting: Internal Medicine

## 2018-01-26 ENCOUNTER — Telehealth: Payer: Self-pay | Admitting: Internal Medicine

## 2018-01-26 NOTE — Telephone Encounter (Signed)
Called patient and states that he did see orthopedist and they have scheduled an appointment in November 7th to get injections. Patient states that he would still like for Dr. Jenny Reichmann to look over the results and have his assistant give a call back with his suggestions

## 2018-01-26 NOTE — Telephone Encounter (Signed)
Can you results patients MRI results per Dr. Gwynn Burly Absence. Thank you

## 2018-01-26 NOTE — Telephone Encounter (Signed)
I can't release image for him to see as I don't have access to Dr. Gwynn Burly inbox; numerous arthritis changes noted- do think he could benefit from steroid injections; would recommend seeing a back specialist. It looks like he was scheduled to see the orthopedist at [place] on 01/15/2018 like he and Dr. Jenny Reichmann had discussed. Does he need another referral?

## 2018-01-26 NOTE — Telephone Encounter (Signed)
Copied from [place] [phone]. Topic: Quick Communication - See Telephone Encounter >> Jan 26, 2018 12:03 PM Vernona Rieger wrote: CRM for notification. See Telephone encounter for: 01/26/18.  Patient would like his MRI Results from Saturday 10/19. Patient is upset because he has not heard from anyone.

## 2018-01-29 ENCOUNTER — Telehealth: Payer: Self-pay

## 2018-01-29 NOTE — Telephone Encounter (Signed)
Noted   Copied from [place] [phone]. Topic: General - Other >> Jan 29, 2018  1:20 PM Iorio, Ja-Kwan wrote: Reason for CRM: Pt wife Pamala Hurry returned call. She states pt has seen the Orthopedic doctor and he has an appt for an injection on 02/12/18.

## 2018-01-29 NOTE — Telephone Encounter (Signed)
Last MRI reviewed and I note pt has seen Dr Raeford Razor (sport medicine); he may have seen orthopedic as well.  Given the MRI it would seem reasonable to at least try the injections as this can sometimes help.  A last resort would be surgury, for which he would need to see orthopedic if has not already.  Thanks

## 2018-01-29 NOTE — Telephone Encounter (Signed)
Called pt, no answer and VM is full. Couldn't leave a message.

## 2018-04-16 DIAGNOSIS — M5030 Other cervical disc degeneration, unspecified cervical region: Secondary | ICD-10-CM | POA: Insufficient documentation

## 2018-04-16 DIAGNOSIS — M4802 Spinal stenosis, cervical region: Secondary | ICD-10-CM | POA: Insufficient documentation

## 2018-05-14 ENCOUNTER — Other Ambulatory Visit (INDEPENDENT_AMBULATORY_CARE_PROVIDER_SITE_OTHER): Payer: Medicare Other

## 2018-05-14 DIAGNOSIS — E119 Type 2 diabetes mellitus without complications: Secondary | ICD-10-CM

## 2018-05-14 DIAGNOSIS — Z0000 Encounter for general adult medical examination without abnormal findings: Secondary | ICD-10-CM

## 2018-05-14 LAB — BASIC METABOLIC PANEL
BUN: 20 mg/dL (ref 6–23)
CALCIUM: 8.9 mg/dL (ref 8.4–10.5)
CO2: 28 mEq/L (ref 19–32)
Chloride: 105 mEq/L (ref 96–112)
Creatinine, Ser: 1.05 mg/dL (ref 0.40–1.50)
GFR: 82.48 mL/min (ref >60.00)
Glucose, Bld: 107 mg/dL — ABNORMAL HIGH (ref 70–99)
POTASSIUM: 4.4 meq/L (ref 3.5–5.1)
SODIUM: 138 meq/L (ref 135–145)

## 2018-05-14 LAB — HEMOGLOBIN A1C: HEMOGLOBIN A1C: 6 % (ref 4.6–6.5)

## 2018-05-14 LAB — CBC WITH DIFFERENTIAL/PLATELET
BASOS ABS: 0 10*3/uL (ref 0.0–0.1)
Basophils Relative: 1 % (ref 0.0–3.0)
EOS ABS: 0.1 10*3/uL (ref 0.0–0.7)
Eosinophils Relative: 1.8 % (ref 0.0–5.0)
HCT: 37.9 % — ABNORMAL LOW (ref 39.0–52.0)
Hemoglobin: 12.5 g/dL — ABNORMAL LOW (ref 13.0–17.0)
LYMPHS ABS: 1.6 10*3/uL (ref 0.7–4.0)
Lymphocytes Relative: 50.7 % — ABNORMAL HIGH (ref 12.0–46.0)
MCHC: 33 g/dL (ref 30.0–36.0)
MCV: 86.5 fl (ref 78.0–100.0)
MONO ABS: 0.4 10*3/uL (ref 0.1–1.0)
Monocytes Relative: 11.8 % (ref 3.0–12.0)
NEUTROS PCT: 34.7 % — AB (ref 43.0–77.0)
Neutro Abs: 1.1 10*3/uL — ABNORMAL LOW (ref 1.4–7.7)
Platelets: 178 10*3/uL (ref 150.0–400.0)
RBC: 4.38 Mil/uL (ref 4.22–5.81)
RDW: 13 % (ref 11.5–15.5)
WBC: 3.1 10*3/uL — ABNORMAL LOW (ref 4.0–10.5)

## 2018-05-14 LAB — TSH: TSH: 2.25 u[IU]/mL (ref 0.35–4.50)

## 2018-05-14 LAB — HEPATIC FUNCTION PANEL
ALBUMIN: 3.9 g/dL (ref 3.5–5.2)
ALT: 14 U/L (ref 0–53)
AST: 21 U/L (ref 0–37)
Alkaline Phosphatase: 51 U/L (ref 39–117)
BILIRUBIN TOTAL: 0.4 mg/dL (ref 0.2–1.2)
Bilirubin, Direct: 0.1 mg/dL (ref 0.0–0.3)
TOTAL PROTEIN: 6.4 g/dL (ref 6.0–8.3)

## 2018-05-14 LAB — PSA: PSA: 1.76 ng/mL (ref 0.10–4.00)

## 2018-05-14 LAB — URINALYSIS, ROUTINE W REFLEX MICROSCOPIC
Bilirubin Urine: NEGATIVE
HGB URINE DIPSTICK: NEGATIVE
KETONES UR: NEGATIVE
LEUKOCYTES UA: NEGATIVE
NITRITE: NEGATIVE
RBC / HPF: NONE SEEN (ref 0–?)
Specific Gravity, Urine: 1.01 (ref 1.000–1.030)
Total Protein, Urine: NEGATIVE
URINE GLUCOSE: NEGATIVE
Urobilinogen, UA: 0.2 (ref 0.0–1.0)
pH: 6 (ref 5.0–8.0)

## 2018-05-14 LAB — LIPID PANEL
CHOLESTEROL: 160 mg/dL (ref 0–200)
HDL: 29.8 mg/dL — ABNORMAL LOW (ref >39.00)
LDL CALC: 102 mg/dL — AB (ref 0–99)
NonHDL: 130.34
Total CHOL/HDL Ratio: 5
Triglycerides: 140 mg/dL (ref 0.0–149.0)
VLDL: 28 mg/dL (ref 0.0–40.0)

## 2018-05-14 LAB — MICROALBUMIN / CREATININE URINE RATIO
CREATININE, U: 59.7 mg/dL
MICROALB/CREAT RATIO: 1.2 mg/g (ref 0.0–30.0)

## 2018-05-15 ENCOUNTER — Encounter: Payer: Self-pay | Admitting: Internal Medicine

## 2018-05-15 ENCOUNTER — Ambulatory Visit: Payer: Medicare Other | Admitting: Internal Medicine

## 2018-05-15 VITALS — BP 102/64 | HR 57 | Temp 98.5°F | Ht 68.0 in | Wt 192.0 lb

## 2018-05-15 DIAGNOSIS — Z23 Encounter for immunization: Secondary | ICD-10-CM | POA: Diagnosis not present

## 2018-05-15 DIAGNOSIS — E119 Type 2 diabetes mellitus without complications: Secondary | ICD-10-CM

## 2018-05-15 DIAGNOSIS — Z0000 Encounter for general adult medical examination without abnormal findings: Secondary | ICD-10-CM | POA: Diagnosis not present

## 2018-05-15 NOTE — Patient Instructions (Addendum)
You had the flu shot today, and the Tdap Tetanus shot today  Please consider seeing Sports medicine in this office for the right knee pain  Please continue all other medications as before, and refills have been done if requested.  Please have the pharmacy call with any other refills you may need.  Please continue your efforts at being more active, low cholesterol diet, and weight control.  You are otherwise up to date with prevention measures today.  Please keep your appointments with your specialists as you may have planned  Please return in 6 months, or sooner if needed, with Lab testing done 3-5 days befor

## 2018-05-15 NOTE — Addendum Note (Signed)
Addended by: Juliet Rude on: 05/15/2018 11:28 AM   Modules accepted: Orders

## 2018-05-15 NOTE — Assessment & Plan Note (Signed)
stable overall by history and exam, recent data reviewed with pt, and pt to continue medical treatment as before,  to f/u any worsening symptoms or concerns  

## 2018-05-15 NOTE — Progress Notes (Signed)
Subjective:    [name], male    DOB: [birth year], 78 y.o.   MRN: [id number]  HPI  Here for wellness and f/u;  Overall doing ok;  Pt denies Chest pain, worsening SOB, DOE, wheezing, orthopnea, PND, worsening LE edema, palpitations, dizziness or syncope.  Pt denies neurological change such as new headache, facial or extremity weakness.  Pt denies polydipsia, polyuria, or low sugar symptoms. Pt states overall good compliance with treatment and medications, good tolerability, and has been trying to follow appropriate diet.  Pt denies worsening depressive symptoms, suicidal ideation or panic. No fever, night sweats, wt loss, loss of appetite, or other constitutional symptoms.  Pt states good ability with ADL's, has low fall risk, home safety reviewed and adequate, no other significant changes in hearing or vision, and only occasionally active with exercise. Due for flu and Tdap.  Does Have also recurring right knee pain with swelling with more walking.  Mild only, does not want tx or further evaluation for now. Is being followed by ortho for left neck radicular symptoms and chronic LBP Past Medical History:  Diagnosis Date  . Acoustic neuroma ([place]) 06/25/2015  . Allergic rhinitis 07/18/2014  . Arthritis   . BPH (benign prostatic hyperplasia)   . Carbuncle    recurrent MRSA carbuncles  . Cataract   . Diabetes mellitus    Type II  . Disk prolapse   . Glaucoma   . Hyperlipidemia   . Hypertension   . Male hypogonadism 07/16/2014  . Sinus bradycardia    chronic, asymptomatic  . Sinusitis 07/29/2012   Past Surgical History:  Procedure Laterality Date  . CATARACT EXTRACTION     x 2  . COLONOSCOPY    . POLYPECTOMY      reports that he quit smoking about 39 years ago. He has never used smokeless tobacco. He reports current alcohol use of about 5.0 standard drinks of alcohol per week. He reports that he does not use drugs. family history includes Cancer in his brother and mother;  Diabetes in his sister; Hyperlipidemia in his brother; Hypertension in his brother, mother, and sister; Stroke in his brother. No Known Allergies Current Outpatient Medications on File Prior to Visit  Medication Sig Dispense Refill  . albuterol (PROVENTIL HFA;VENTOLIN HFA) 108 (90 Base) MCG/ACT inhaler Inhale 2 puffs into the lungs every 4 (four) hours as needed for wheezing or shortness of breath. 1 Inhaler 0  . aspirin 81 MG tablet Take 81 mg by mouth daily.     Marland Kitchen glucose blood (ONE TOUCH TEST STRIPS) test strip Use as directed once daily 100 each 11  . HYDROcodone-acetaminophen (NORCO/VICODIN) 5-325 MG tablet Take 1 tablet by mouth every 6 (six) hours as needed for moderate pain. 30 tablet 0  . lisinopril (PRINIVIL,ZESTRIL) 10 MG tablet TAKE 1 TABLET BY MOUTH EVERY DAY 30 tablet 8  . metFORMIN (GLUCOPHAGE-XR) 500 MG 24 hr tablet Take 1 tablet (500 mg total) by mouth daily with breakfast. 90 tablet 3  . ONE TOUCH LANCETS MISC Use to check blood sugar once daily (dx code 250.00) 100 each 11  . pravastatin (PRAVACHOL) 40 MG tablet TAKE 1 TABLET BY MOUTH EVERY DAY 90 tablet 1  . terazosin (HYTRIN) 2 MG capsule Take 1 capsule (2 mg total) by mouth at bedtime. 30 capsule 0  . tizanidine (ZANAFLEX) 2 MG capsule Take 1 capsule (2 mg total) by mouth 3 (three) times daily as needed for muscle spasms. 60 capsule 1  No current facility-administered medications on file prior to visit.    Review of Systems Constitutional: Negative for other unusual diaphoresis, sweats, appetite or weight changes HENT: Negative for other worsening hearing loss, ear pain, facial swelling, mouth sores or neck stiffness.   Eyes: Negative for other worsening pain, redness or other visual disturbance.  Respiratory: Negative for other stridor or swelling Cardiovascular: Negative for other palpitations or other chest pain  Gastrointestinal: Negative for worsening diarrhea or loose stools, blood in stool, distention or other  pain Genitourinary: Negative for hematuria, flank pain or other change in urine volume.  Musculoskeletal: Negative for myalgias or other joint swelling.  Skin: Negative for other color change, or other wound or worsening drainage.  Neurological: Negative for other syncope or numbness. Hematological: Negative for other adenopathy or swelling Psychiatric/Behavioral: Negative for hallucinations, other worsening agitation, SI, self-injury, or new decreased concentration All other system neg per pt    Objective:   Physical Exam BP 102/64   Pulse (!) 57   Temp 98.5 F (36.9 C) (Oral)   Ht 5\' 8"  (1.727 m)   Wt 192 lb (87.1 kg)   SpO2 98%   BMI 29.19 kg/m  VS noted,  Constitutional: Pt is oriented to person, place, and time. Appears well-developed and well-nourished, in no significant distress and comfortable Head: Normocephalic and atraumatic  Eyes: Conjunctivae and EOM are normal. Pupils are equal, round, and reactive to light Right Ear: External ear normal without discharge Left Ear: External ear normal without discharge Nose: Nose without discharge or deformity Mouth/Throat: Oropharynx is without other ulcerations and moist  Neck: Normal range of motion. Neck supple. No JVD present. No tracheal deviation present or significant neck LA or mass Cardiovascular: Normal rate, regular rhythm, normal heart sounds and intact distal pulses.   Pulmonary/Chest: WOB normal and breath sounds without rales or wheezing  Abdominal: Soft. Bowel sounds are normal. NT. No HSM  Musculoskeletal: Normal range of motion. Exhibits no edema Lymphadenopathy: Has no other cervical adenopathy.  Neurological: Pt is alert and oriented to person, place, and time. Pt has normal reflexes. No cranial nerve deficit. Motor grossly intact, Gait intact Skin: Skin is warm and dry. No rash noted or new ulcerations Psychiatric:  Has normal mood and affect. Behavior is normal without agitation No other exam findings Lab  Results  Component Value Date   WBC 3.1 (L) 05/14/2018   HGB 12.5 (L) 05/14/2018   HCT 37.9 (L) 05/14/2018   PLT 178.0 05/14/2018   GLUCOSE 107 (H) 05/14/2018   CHOL 160 05/14/2018   TRIG 140.0 05/14/2018   HDL 29.80 (L) 05/14/2018   LDLDIRECT 121.0 11/07/2017   LDLCALC 102 (H) 05/14/2018   ALT 14 05/14/2018   AST 21 05/14/2018   NA 138 05/14/2018   K 4.4 05/14/2018   CL 105 05/14/2018   CREATININE 1.05 05/14/2018   BUN 20 05/14/2018   CO2 28 05/14/2018   TSH 2.25 05/14/2018   PSA 1.76 05/14/2018   HGBA1C 6.0 05/14/2018   MICROALBUR <0.7 05/14/2018        Assessment & Plan:

## 2018-05-15 NOTE — Assessment & Plan Note (Signed)

## 2018-06-16 ENCOUNTER — Other Ambulatory Visit: Payer: Self-pay | Admitting: Internal Medicine

## 2018-08-11 ENCOUNTER — Other Ambulatory Visit: Payer: Self-pay | Admitting: Internal Medicine

## 2018-08-17 ENCOUNTER — Other Ambulatory Visit: Payer: Self-pay | Admitting: Internal Medicine

## 2018-10-04 ENCOUNTER — Telehealth: Payer: Self-pay | Admitting: Internal Medicine

## 2018-10-04 NOTE — Telephone Encounter (Signed)
Patient states he has no more diabetic test strips or lancets. Patient states he does not pick up prescription that PCP just gives him the supplies.  Call back # [phone] glucose blood (ONE TOUCH TEST STRIPS) test strip ONE TOUCH LANCETS MISC

## 2018-10-08 NOTE — Telephone Encounter (Signed)
Called pt, LVM.   

## 2018-10-08 NOTE — Telephone Encounter (Signed)
Dr. Jenny Reichmann please advise. I don't recall [place] giving out lancets and we don't have samples of strips.

## 2018-10-08 NOTE — Telephone Encounter (Signed)
Pt has been informed that we do not keep diabetic supplies here in the office. He stated that he would check with back with [place] after looking at his script bottle.

## 2018-10-08 NOTE — Telephone Encounter (Signed)
Very sorry, to my knowledge we have never supplied strips from this office, so pt may be confused or may have gotten these from a different office

## 2018-10-16 ENCOUNTER — Telehealth: Payer: Self-pay | Admitting: Internal Medicine

## 2018-10-16 NOTE — Telephone Encounter (Signed)
Called patient to schedule AWV; no answer or no voicemail set up. SF

## 2018-11-14 ENCOUNTER — Ambulatory Visit (INDEPENDENT_AMBULATORY_CARE_PROVIDER_SITE_OTHER)
Admission: RE | Admit: 2018-11-14 | Discharge: 2018-11-14 | Disposition: A | Discharge status: Home / Self Care | Payer: Medicare Other | Source: Ambulatory Visit | Attending: Internal Medicine | Admitting: Internal Medicine

## 2018-11-14 ENCOUNTER — Ambulatory Visit (INDEPENDENT_AMBULATORY_CARE_PROVIDER_SITE_OTHER): Payer: Medicare Other | Admitting: Internal Medicine

## 2018-11-14 ENCOUNTER — Other Ambulatory Visit: Payer: Self-pay

## 2018-11-14 ENCOUNTER — Encounter: Payer: Self-pay | Admitting: Internal Medicine

## 2018-11-14 VITALS — BP 122/76 | HR 73 | Temp 98.3°F | Ht 68.0 in | Wt 192.0 lb

## 2018-11-14 DIAGNOSIS — E611 Iron deficiency: Secondary | ICD-10-CM

## 2018-11-14 DIAGNOSIS — I10 Essential (primary) hypertension: Secondary | ICD-10-CM

## 2018-11-14 DIAGNOSIS — R059 Cough, unspecified: Secondary | ICD-10-CM

## 2018-11-14 DIAGNOSIS — Z0000 Encounter for general adult medical examination without abnormal findings: Secondary | ICD-10-CM

## 2018-11-14 DIAGNOSIS — R05 Cough: Secondary | ICD-10-CM

## 2018-11-14 DIAGNOSIS — E119 Type 2 diabetes mellitus without complications: Secondary | ICD-10-CM | POA: Diagnosis not present

## 2018-11-14 DIAGNOSIS — E538 Deficiency of other specified B group vitamins: Secondary | ICD-10-CM | POA: Diagnosis not present

## 2018-11-14 DIAGNOSIS — E559 Vitamin D deficiency, unspecified: Secondary | ICD-10-CM

## 2018-11-14 MED ORDER — GLUCOSE BLOOD VI STRP: ORAL_STRIP | 3 refills | Status: DC

## 2018-11-14 MED ORDER — AZITHROMYCIN 250 MG PO TABS: ORAL_TABLET | ORAL | 0 refills | Status: DC

## 2018-11-14 MED ORDER — LANCETS MISC: 3 refills | Status: DC

## 2018-11-14 NOTE — Progress Notes (Signed)
Subjective:    [name], male    DOB: 1940/09/12, 78 y.o.   MRN: [id number]  HPI  Here with acute onset mild to mod 2-3 days ST, HA, general weakness and malaise, with prod cough greenish sputum, but Pt denies chest pain, increased sob or doe, wheezing, orthopnea, PND, increased LE swelling, palpitations, dizziness or syncope.  Pt denies new neurological symptoms such as new headache, or facial or extremity weakness or numbness   Pt denies polydipsia, polyuria,  Pt continues to have recurring LBP without change in severity, bowel or bladder change, fever, wt loss,  worsening LE pain/numbness/weakness, gait change or falls, tolerable with pain med per Dr Nelva Bush.  Past Medical History:  Diagnosis Date  . Acoustic neuroma ([place]) 06/25/2015  . Allergic rhinitis 07/18/2014  . Arthritis   . BPH (benign prostatic hyperplasia)   . Carbuncle    recurrent MRSA carbuncles  . Cataract   . Diabetes mellitus    Type II  . Disk prolapse   . Glaucoma   . Hyperlipidemia   . Hypertension   . Male hypogonadism 07/16/2014  . Sinus bradycardia    chronic, asymptomatic  . Sinusitis 07/29/2012   Past Surgical History:  Procedure Laterality Date  . CATARACT EXTRACTION     x 2  . COLONOSCOPY    . POLYPECTOMY      reports that he quit smoking about 40 years ago. He has never used smokeless tobacco. He reports current alcohol use of about 5.0 standard drinks of alcohol per week. He reports that he does not use drugs. family history includes Cancer in his brother and mother; Diabetes in his sister; Hyperlipidemia in his brother; Hypertension in his brother, mother, and sister; Stroke in his brother. No Known Allergies Current Outpatient Medications on File Prior to Visit  Medication Sig Dispense Refill  . albuterol (PROVENTIL HFA;VENTOLIN HFA) 108 (90 Base) MCG/ACT inhaler Inhale 2 puffs into the lungs every 4 (four) hours as needed for wheezing or shortness of breath. 1 Inhaler 0  .  aspirin 81 MG tablet Take 81 mg by mouth daily.     Marland Kitchen HYDROcodone-acetaminophen (NORCO/VICODIN) 5-325 MG tablet Take 1 tablet by mouth every 6 (six) hours as needed for moderate pain. 30 tablet 0  . lisinopril (ZESTRIL) 10 MG tablet TAKE 1 TABLET BY MOUTH EVERY DAY 90 tablet 2  . metFORMIN (GLUCOPHAGE-XR) 500 MG 24 hr tablet TAKE 1 TABLET BY MOUTH EVERY DAY WITH BREAKFAST 90 tablet 1  . pravastatin (PRAVACHOL) 40 MG tablet TAKE 1 TABLET BY MOUTH EVERY DAY 90 tablet 1  . terazosin (HYTRIN) 2 MG capsule TAKE 1 CAPSULE BY MOUTH EVERY DAY 90 capsule 3  . tizanidine (ZANAFLEX) 2 MG capsule Take 1 capsule (2 mg total) by mouth 3 (three) times daily as needed for muscle spasms. 60 capsule 1   No current facility-administered medications on file prior to visit.    Review of Systems  Constitutional: Negative for other unusual diaphoresis or sweats HENT: Negative for ear discharge or swelling Eyes: Negative for other worsening visual disturbances Respiratory: Negative for stridor or other swelling  Gastrointestinal: Negative for worsening distension or other blood Genitourinary: Negative for retention or other urinary change Musculoskeletal: Negative for other MSK pain or swelling Skin: Negative for color change or other new lesions Neurological: Negative for worsening tremors and other numbness  Psychiatric/Behavioral: Negative for worsening agitation or other fatigue All other system neg per pt    Objective:  Physical Exam BP 122/76   Pulse 73   Temp 98.3 F (36.8 C) (Oral)   Ht 5\' 8"  (1.727 m)   Wt 192 lb (87.1 kg)   SpO2 98%   BMI 29.19 kg/m  VS noted, mild ill Constitutional: Pt appears in NAD HENT: Head: NCAT.  Right Ear: External ear normal.  Left Ear: External ear normal.  Eyes: . Pupils are equal, round, and reactive to light. Conjunctivae and EOM are normal Nose: without d/c or deformity Neck: Neck supple. Gross normal ROM Cardiovascular: Normal rate and regular rhythm.    Pulmonary/Chest: Effort normal and breath sounds decreased without rales or wheezing.  Neurological: Pt is alert. At baseline orientation, motor grossly intact Skin: Skin is warm. No rashes, other new lesions, no LE edema Psychiatric: Pt behavior is normal without agitation  No other exam findings Lab Results  Component Value Date   WBC 3.1 (L) 05/14/2018   HGB 12.5 (L) 05/14/2018   HCT 37.9 (L) 05/14/2018   PLT 178.0 05/14/2018   GLUCOSE 107 (H) 05/14/2018   CHOL 160 05/14/2018   TRIG 140.0 05/14/2018   HDL 29.80 (L) 05/14/2018   LDLDIRECT 121.0 11/07/2017   LDLCALC 102 (H) 05/14/2018   ALT 14 05/14/2018   AST 21 05/14/2018   NA 138 05/14/2018   K 4.4 05/14/2018   CL 105 05/14/2018   CREATININE 1.05 05/14/2018   BUN 20 05/14/2018   CO2 28 05/14/2018   TSH 2.25 05/14/2018   PSA 1.76 05/14/2018   HGBA1C 6.0 05/14/2018   MICROALBUR <0.7 05/14/2018      Assessment & Plan:

## 2018-11-14 NOTE — Patient Instructions (Addendum)
Please take all new medication as prescribed - the antibiotic (sent to [facility])  Your Diabetic supplies were sent to [facility]  Please continue all other medications as before, and refills have been done if requested.  Please have the pharmacy call with any other refills you may need.  Please continue your efforts at being more active, low cholesterol diet, and weight control.  You are otherwise up to date with prevention measures today.  Please keep your appointments with your specialists as you may have planned  Please go to the XRAY Department in the Basement (go straight as you get off the elevator) for the x-ray testing  Please go to the LAB in the Basement (turn left off the elevator) for the tests to be done today  You will be contacted by phone if any changes need to be made immediately.  Otherwise, you will receive a letter about your results with an explanation, but please check with MyChart first.  Please remember to sign up for MyChart if you have not done so, as this will be important to you in the future with finding out test results, communicating by private email, and scheduling acute appointments online when needed.  Please return in 6 months, or sooner if needed, with Lab testing done 3-5 days before

## 2018-11-17 ENCOUNTER — Encounter: Payer: Self-pay | Admitting: Internal Medicine

## 2018-11-17 NOTE — Assessment & Plan Note (Signed)
stable overall by history and exam, recent data reviewed with pt, and pt to continue medical treatment as before,  to f/u any worsening symptoms or concerns  

## 2018-11-17 NOTE — Assessment & Plan Note (Signed)
C/w likely infetious etiology, cant r/o bronchitis vs pna, for cxr, for antibiotic,  to f/u any worsening symptoms or concerns

## 2018-12-04 ENCOUNTER — Ambulatory Visit: Payer: Self-pay

## 2018-12-04 NOTE — Telephone Encounter (Signed)
Incoming call from Patient and daughter.  Patient complains of burning and feeling bloated in chest after eating also heard that Metformin on the recall wants to know what to about what he needs to do. He can swallow liquids and solids.onsets was Sunday night.  Tried Web designer. No relief.Patient request a return. Call from Provider        Answer Assessment - Initial Assessment Questions 1. SYMPTOM: "Are you having difficulty swallowing liquids, solids, or both?"    denies 2. ONSET: "When did the swallowing problems begin?"     Sunday night at 9pm 3. CAUSE: "What do you think is causing the problem?"     gas 4. CHRONIC/RECURRENT: "Is this a new problem for you?"  If no, ask: "How long have you had this problem?" (e.g., days, weeks, months)      Denies tried alkalizer and rolaids no relief.  5. OTHER SYMPTOMS: "Do you have any other symptoms?" (e.g., difficulty breathing, sore throat, swollen tongue, chest pain)    Little Pain most pain top of stmoach 6. PREGNANCY: "Is there any chance you are pregnant?" "When was your last menstrual period?"     Na  Protocols used: SWALLOWING DIFFICULTY-A-AH

## 2018-12-05 ENCOUNTER — Other Ambulatory Visit: Payer: Self-pay

## 2018-12-05 ENCOUNTER — Telehealth: Payer: Self-pay | Admitting: Internal Medicine

## 2018-12-05 ENCOUNTER — Ambulatory Visit (INDEPENDENT_AMBULATORY_CARE_PROVIDER_SITE_OTHER): Payer: Medicare Other | Admitting: Internal Medicine

## 2018-12-05 ENCOUNTER — Encounter: Payer: Self-pay | Admitting: Internal Medicine

## 2018-12-05 ENCOUNTER — Other Ambulatory Visit (INDEPENDENT_AMBULATORY_CARE_PROVIDER_SITE_OTHER): Payer: Medicare Other

## 2018-12-05 VITALS — BP 116/78 | HR 65 | Temp 98.9°F | Ht 68.0 in | Wt 191.0 lb

## 2018-12-05 DIAGNOSIS — R079 Chest pain, unspecified: Secondary | ICD-10-CM | POA: Diagnosis not present

## 2018-12-05 DIAGNOSIS — R1013 Epigastric pain: Secondary | ICD-10-CM

## 2018-12-05 DIAGNOSIS — R739 Hyperglycemia, unspecified: Secondary | ICD-10-CM | POA: Insufficient documentation

## 2018-12-05 LAB — HEPATIC FUNCTION PANEL
ALT: 11 U/L (ref 0–53)
AST: 19 U/L (ref 0–37)
Albumin: 4 g/dL (ref 3.5–5.2)
Alkaline Phosphatase: 46 U/L (ref 39–117)
Bilirubin, Direct: 0.1 mg/dL (ref 0.0–0.3)
Total Bilirubin: 0.4 mg/dL (ref 0.2–1.2)
Total Protein: 6.9 g/dL (ref 6.0–8.3)

## 2018-12-05 LAB — CBC WITH DIFFERENTIAL/PLATELET
Basophils Absolute: 0 10*3/uL (ref 0.0–0.1)
Basophils Relative: 1 % (ref 0.0–3.0)
Eosinophils Absolute: 0.2 10*3/uL (ref 0.0–0.7)
Eosinophils Relative: 6.9 % — ABNORMAL HIGH (ref 0.0–5.0)
HCT: 37.3 % — ABNORMAL LOW (ref 39.0–52.0)
Hemoglobin: 12.5 g/dL — ABNORMAL LOW (ref 13.0–17.0)
Lymphocytes Relative: 49 % — ABNORMAL HIGH (ref 12.0–46.0)
Lymphs Abs: 1.7 10*3/uL (ref 0.7–4.0)
MCHC: 33.6 g/dL (ref 30.0–36.0)
MCV: 86 fl (ref 78.0–100.0)
Monocytes Absolute: 0.4 10*3/uL (ref 0.1–1.0)
Monocytes Relative: 12.5 % — ABNORMAL HIGH (ref 3.0–12.0)
Neutro Abs: 1 10*3/uL — ABNORMAL LOW (ref 1.4–7.7)
Neutrophils Relative %: 30.6 % — ABNORMAL LOW (ref 43.0–77.0)
Platelets: 180 10*3/uL (ref 150.0–400.0)
RBC: 4.34 Mil/uL (ref 4.22–5.81)
RDW: 12.7 % (ref 11.5–15.5)
WBC: 3.4 10*3/uL — ABNORMAL LOW (ref 4.0–10.5)

## 2018-12-05 LAB — URINALYSIS, ROUTINE W REFLEX MICROSCOPIC
Bilirubin Urine: NEGATIVE
Hgb urine dipstick: NEGATIVE
Ketones, ur: NEGATIVE
Leukocytes,Ua: NEGATIVE
Nitrite: NEGATIVE
RBC / HPF: NONE SEEN (ref 0–?)
Specific Gravity, Urine: <=1.005 — AB (ref 1.000–1.030)
Total Protein, Urine: NEGATIVE
Urine Glucose: NEGATIVE
Urobilinogen, UA: 0.2 (ref 0.0–1.0)
pH: 6.5 (ref 5.0–8.0)

## 2018-12-05 LAB — BASIC METABOLIC PANEL
BUN: 15 mg/dL (ref 6–23)
CO2: 31 mEq/L (ref 19–32)
Calcium: 9.3 mg/dL (ref 8.4–10.5)
Chloride: 105 mEq/L (ref 96–112)
Creatinine, Ser: 0.98 mg/dL (ref 0.40–1.50)
GFR: 89.18 mL/min (ref >60.00)
Glucose, Bld: 82 mg/dL (ref 70–99)
Potassium: 3.9 mEq/L (ref 3.5–5.1)
Sodium: 140 mEq/L (ref 135–145)

## 2018-12-05 LAB — LIPASE: Lipase: 33 U/L (ref 11.0–59.0)

## 2018-12-05 LAB — HEMOGLOBIN A1C: Hgb A1c MFr Bld: 6.1 % (ref 4.6–6.5)

## 2018-12-05 MED ORDER — POLYETHYLENE GLYCOL 3350 17 GM/SCOOP PO POWD: 17.0000 g | Freq: Two times a day (BID) | ORAL | 1 refills | Status: DC | PRN

## 2018-12-05 MED ORDER — PANTOPRAZOLE SODIUM 40 MG PO TBEC: 40.0000 mg | DELAYED_RELEASE_TABLET | Freq: Every day | ORAL | 3 refills | Status: DC

## 2018-12-05 MED ORDER — METFORMIN HCL 500 MG PO TABS: 500.0000 mg | ORAL_TABLET | Freq: Every day | ORAL | 3 refills | Status: DC

## 2018-12-05 NOTE — Progress Notes (Signed)
Subjective:    [name], male    DOB: [birth year], 78 y.o.   MRN: [id number]  HPI  Here with epigastric pain x 1 wk, with gradually incresaed pain, regurgitation and reflux, with nause, no vomiting, but assoc with belching and gas bloating.  Denies worsening dysphagia, or blood.  Has had some constipation which he thought was better recently, though has sensation he has to have BM sometimes then cant do it.   Pt denies fever, wt loss, night sweats, loss of appetite, or other constitutional symptoms  Pt denies chest pain except for a sore to touch area at the lower sternum,  increased sob or doe, wheezing, orthopnea, PND, increased LE swelling, palpitations, dizziness or syncope.,  Pt is concerned will need ecg. Wt Readings from Last 3 Encounters:  12/05/18 191 lb (86.6 kg)  11/14/18 192 lb (87.1 kg)  05/15/18 192 lb (87.1 kg)   Past Medical History:  Diagnosis Date  . Acoustic neuroma ([place]) 06/25/2015  . Allergic rhinitis 07/18/2014  . Arthritis   . BPH (benign prostatic hyperplasia)   . Carbuncle    recurrent MRSA carbuncles  . Cataract   . Diabetes mellitus    Type II  . Disk prolapse   . Glaucoma   . Hyperlipidemia   . Hypertension   . Male hypogonadism 07/16/2014  . Sinus bradycardia    chronic, asymptomatic  . Sinusitis 07/29/2012   Past Surgical History:  Procedure Laterality Date  . CATARACT EXTRACTION     x 2  . COLONOSCOPY    . POLYPECTOMY      reports that he quit smoking about 40 years ago. He has never used smokeless tobacco. He reports current alcohol use of about 5.0 standard drinks of alcohol per week. He reports that he does not use drugs. family history includes Cancer in his brother and mother; Diabetes in his sister; Hyperlipidemia in his brother; Hypertension in his brother, mother, and sister; Stroke in his brother. No Known Allergies Current Outpatient Medications on File Prior to Visit  Medication Sig Dispense Refill  . albuterol  (PROVENTIL HFA;VENTOLIN HFA) 108 (90 Base) MCG/ACT inhaler Inhale 2 puffs into the lungs every 4 (four) hours as needed for wheezing or shortness of breath. 1 Inhaler 0  . aspirin 81 MG tablet Take 81 mg by mouth daily.     Marland Kitchen azithromycin (ZITHROMAX Z-PAK) 250 MG tablet 2 tab by mouth day 1, then 1 per day 6 tablet 0  . glucose blood (ONE TOUCH TEST STRIPS) test strip Use as directed once daily E11.9 100 each 3  . HYDROcodone-acetaminophen (NORCO/VICODIN) 5-325 MG tablet Take 1 tablet by mouth every 6 (six) hours as needed for moderate pain. 30 tablet 0  . Lancets MISC Use as directed once daily E11.9 100 each 3  . lisinopril (ZESTRIL) 10 MG tablet TAKE 1 TABLET BY MOUTH EVERY DAY 90 tablet 2  . metFORMIN (GLUCOPHAGE) 500 MG tablet Take 1 tablet (500 mg total) by mouth daily with breakfast. 90 tablet 3  . pantoprazole (PROTONIX) 40 MG tablet Take 1 tablet (40 mg total) by mouth daily. 90 tablet 3  . pravastatin (PRAVACHOL) 40 MG tablet TAKE 1 TABLET BY MOUTH EVERY DAY 90 tablet 1  . terazosin (HYTRIN) 2 MG capsule TAKE 1 CAPSULE BY MOUTH EVERY DAY 90 capsule 3  . tizanidine (ZANAFLEX) 2 MG capsule Take 1 capsule (2 mg total) by mouth 3 (three) times daily as needed for muscle spasms. 60 capsule  1   No current facility-administered medications on file prior to visit.    Review of Systems  Constitutional: Negative for other unusual diaphoresis or sweats HENT: Negative for ear discharge or swelling Eyes: Negative for other worsening visual disturbances Respiratory: Negative for stridor or other swelling  Gastrointestinal: Negative for worsening distension or other blood Genitourinary: Negative for retention or other urinary change Musculoskeletal: Negative for other MSK pain or swelling Skin: Negative for color change or other new lesions Neurological: Negative for worsening tremors and other numbness  Psychiatric/Behavioral: Negative for worsening agitation or other fatigue ALl other system  neg per pt    Objective:   Physical Exam BP 116/78   Pulse 65   Temp 98.9 F (37.2 C) (Oral)   Ht 5\' 8"  (1.727 m)   Wt 191 lb (86.6 kg)   SpO2 98%   BMI 29.04 kg/m  VS noted,  Constitutional: Pt appears in NAD HENT: Head: NCAT.  Right Ear: External ear normal.  Left Ear: External ear normal.  Eyes: . Pupils are equal, round, and reactive to light. Conjunctivae and EOM are normal Nose: without d/c or deformity Neck: Neck supple. Gross normal ROM Cardiovascular: Normal rate and regular rhythm.  , + tender low sternal near xyphoid Pulmonary/Chest: Effort normal and breath sounds without rales or wheezing.  Abd:  Soft,  ND, + BS, no organomegaly, mild epigastric tender, no guarding or rebound Neurological: Pt is alert. At baseline orientation, motor grossly intact Skin: Skin is warm. No rashes, other new lesions, no LE edema Psychiatric: Pt behavior is normal without agitation  No other exam findings  ECG today I have personally interpreted - Sinus brady at 58    Assessment & Plan:

## 2018-12-05 NOTE — Assessment & Plan Note (Addendum)
Mild to mod, etiology unclear, for increased PPI to bid, labs as ordered, to f/u any worsening symptoms or concerns, and abd u/s r/o GB  Note:  Total time for pt hx, exam, review of record with pt in the room, determination of diagnoses and plan for further eval and tx is > 40 min, with over 50% spent in coordination and counseling of patient including the differential dx, tx, further evaluation and other management of epigastric pain, hyperglycemia, CP

## 2018-12-05 NOTE — Telephone Encounter (Signed)
Pt has been informed.

## 2018-12-05 NOTE — Assessment & Plan Note (Signed)
stable overall by history and exam, recent data reviewed with pt, and pt to continue medical treatment as before,  to f/u any worsening symptoms or concerns  

## 2018-12-05 NOTE — Patient Instructions (Signed)
Please take all new medication as prescribed - the miralax for constipation  Ok to increase the protonix to twice per day  Your EKG was OK today  Please continue all other medications as before, and refills have been done if requested.  Please have the pharmacy call with any other refills you may need.  Please continue your efforts at being more active, low cholesterol diet, and weight control..  Please keep your appointments with your specialists as you may have planned  You will be contacted regarding the referral for: Abdomen ultrasound  Please go to the LAB in the Basement (turn left off the elevator) for the tests to be done today  You will be contacted by phone if any changes need to be made immediately.  Otherwise, you will receive a letter about your results with an explanation, but please check with MyChart first.  Please remember to sign up for MyChart if you have not done so, as this will be important to you in the future with finding out test results, communicating by private email, and scheduling acute appointments online when needed.

## 2018-12-05 NOTE — Addendum Note (Signed)
Addended by: Biagio Borg on: 12/05/2018 12:31 PM   Modules accepted: Orders

## 2018-12-05 NOTE — Telephone Encounter (Signed)
Ok to start protonix 40 qd  Thanks for your inquiry about the concern related to a possible cancer causing contaminant to the metformin ER.   We will simply need to change your metformin ER to the regular metformin as there has been no concern about this with the regular metformin.  We will send a new prescription, and simply start this when you are able instead.

## 2018-12-05 NOTE — Assessment & Plan Note (Signed)
C/w likely msk, ecg reviewed, pt reassured

## 2018-12-05 NOTE — Telephone Encounter (Signed)
Copied from [place] [phone]. Topic: General - Other >> Dec 04, 2018  5:10 PM Pauline Good wrote: Reason for CRM: pt has question about his metformin being recalled.

## 2018-12-06 NOTE — Addendum Note (Signed)
Addended by: Biagio Borg on: 12/06/2018 01:19 PM   Modules accepted: Orders

## 2018-12-14 ENCOUNTER — Ambulatory Visit
Admission: RE | Admit: 2018-12-14 | Discharge: 2018-12-14 | Disposition: A | Discharge status: Home / Self Care | Payer: Medicare Other | Source: Ambulatory Visit | Attending: Internal Medicine | Admitting: Internal Medicine

## 2018-12-14 DIAGNOSIS — R1013 Epigastric pain: Secondary | ICD-10-CM

## 2018-12-21 ENCOUNTER — Telehealth: Payer: Self-pay

## 2018-12-21 NOTE — Telephone Encounter (Signed)
Pt has been informed of results and expressed understanding.   Copied from [place] [phone]. Topic: General - Other >> Dec 20, 2018  2:56 PM Carolyn Stare wrote: Pt would like a call back about his [place] results

## 2019-01-07 ENCOUNTER — Telehealth: Payer: Self-pay | Admitting: Internal Medicine

## 2019-01-07 NOTE — Telephone Encounter (Signed)
I believe the problem with the metformin ER pill manufacturing has been fixed; let me know if he was having n/v on this as well or we can change back to the metformin ER

## 2019-01-07 NOTE — Telephone Encounter (Signed)
Pts metformin has been changed from the XR to regular metFORMIN (GLUCOPHAGE) 500 MG tablet But now Pt has been experiencing vomiting ever since he received an ultrasound and wants to know if this could be what's causing the vomiting/ please advise

## 2019-01-08 NOTE — Telephone Encounter (Signed)
Pt has been informed but he stated that he is not a diabetic and doesn't understand why he is on metformin. Please advise.   I reviewed his lab couple of A1c and I don't see it near 6.5 or above.

## 2019-01-08 NOTE — Telephone Encounter (Signed)
Pt called and he was informed to stop medication. No further questions

## 2019-01-08 NOTE — Telephone Encounter (Signed)
Ok to stop the metformin

## 2019-01-08 NOTE — Telephone Encounter (Signed)
Called pt, LVM.   

## 2019-01-09 NOTE — Telephone Encounter (Signed)
Noted  

## 2019-01-21 NOTE — Progress Notes (Addendum)
Subjective:   Jonathan Lewis is a 78 y.o. male who presents for Medicare Annual/Subsequent preventive examination.  Review of Systems:   Cardiac Risk Factors include: advanced age (>69men, >99 women);dyslipidemia;male gender;hypertension  Sleep patterns: has frequent nighttime awakenings, gets up 1-2 times nightly to void and sleeps 5-6 hours nightly. Patient reports insomnia issues, discussed recommended sleep tips and stress reduction tips, Relevant patient education assigned to patient using Emmi.  Home Safety/Smoke Alarms: Feels safe in home. Smoke alarms in place.  Living environment; residence and Firearm Safety: 1-story house/ trailer. Lives with wife, no needs for DME, good support system  Seat Belt Safety/Bike Helmet: Wears seat belt.     Objective:    Vitals: There were no vitals taken for this visit.  There is no height or weight on file to calculate BMI.  Advanced Directives 08/25/2016 06/29/2015 05/05/2015 03/11/2015  Does Patient Have a Medical Advance Directive? Yes No Yes No  Type of Advance Directive Living will - [place] -  Does patient want to make changes to medical advance directive? - - No - Patient declined -  Copy of [place] in Chart? - - No - copy requested -    Tobacco Social History   Tobacco Use  Smoking Status Former Smoker  . Quit date: 10/03/1978  . Years since quitting: 40.3  Smokeless Tobacco Never Used     Counseling given: Not Answered  Past Medical History:  Diagnosis Date  . Acoustic neuroma ([place]) 06/25/2015  . Allergic rhinitis 07/18/2014  . Arthritis   . BPH (benign prostatic hyperplasia)   . Carbuncle    recurrent MRSA carbuncles  . Cataract   . Diabetes mellitus    Type II  . Disk prolapse   . Glaucoma   . Hyperlipidemia   . Hypertension   . Male hypogonadism 07/16/2014  . Sinus bradycardia    chronic, asymptomatic  . Sinusitis 07/29/2012   Past Surgical History:  Procedure  Laterality Date  . CATARACT EXTRACTION     x 2  . COLONOSCOPY    . POLYPECTOMY     Family History  Problem Relation Age of Onset  . Cancer Mother   . Hypertension Mother   . Diabetes Sister   . Cancer Brother        colon  . Hypertension Brother   . Hyperlipidemia Brother   . Stroke Brother   . Hypertension Sister   . Colon cancer Neg Hx   . Heart attack Neg Hx   . Sudden death Neg Hx    Social History   Socioeconomic History  . Marital status: Married    Spouse name: Not on file  . Number of children: 7  . Years of education: 7  . Highest education level: Not on file  Occupational History  . Occupation: Mining engineer: RETIRED  Social Needs  . Financial resource strain: Not hard at all  . Food insecurity    Worry: Never true    Inability: Never true  . Transportation needs    Medical: No    Non-medical: No  Tobacco Use  . Smoking status: Former Smoker    Quit date: 10/03/1978    Years since quitting: 40.3  . Smokeless tobacco: Never Used  Substance and Sexual Activity  . Alcohol use: Yes    Alcohol/week: 5.0 standard drinks    Types: 5 Standard drinks or equivalent per week    Comment: pint, occasion beer  .  Drug use: No  . Sexual activity: Yes    Partners: Female  Lifestyle  . Physical activity    Days per week: 0 days    Minutes per session: 0 min  . Stress: Not at all  Relationships  . Social connections    Talks on phone: More than three times a week    Gets together: More than three times a week    Attends religious service: More than 4 times per year    Active member of club or organization: Yes    Attends meetings of clubs or organizations: More than 4 times per year    Relationship status: Married  Other Topics Concern  . Not on file  Social History Narrative   9th grade.  Married '60. [name] 3 sons -'22 '64, '66; 4 dtrs -'60, '62, '61, '65; 15 grands, 25 great-grands.   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   ACD-  discussed living will and HCPOA (July '14) provided packet.           Outpatient Encounter Medications as of 01/22/2019  Medication Sig  . aspirin 81 MG tablet Take 81 mg by mouth daily.   Marland Kitchen HYDROcodone-acetaminophen (NORCO/VICODIN) 5-325 MG tablet Take 1 tablet by mouth every 6 (six) hours as needed for moderate pain.  Marland Kitchen lisinopril (ZESTRIL) 10 MG tablet TAKE 1 TABLET BY MOUTH EVERY DAY  . pantoprazole (PROTONIX) 40 MG tablet Take 1 tablet (40 mg total) by mouth daily.  . polyethylene glycol powder (GLYCOLAX/MIRALAX) 17 GM/SCOOP powder Take 17 g by mouth 2 (two) times daily as needed.  . pravastatin (PRAVACHOL) 40 MG tablet TAKE 1 TABLET BY MOUTH EVERY DAY  . terazosin (HYTRIN) 2 MG capsule TAKE 1 CAPSULE BY MOUTH EVERY DAY  . latanoprost (XALATAN) 0.005 % ophthalmic solution INSTILL 1 DROP INTO BOTH EYES IN THE EVENING  . [DISCONTINUED] albuterol (PROVENTIL HFA;VENTOLIN HFA) 108 (90 Base) MCG/ACT inhaler Inhale 2 puffs into the lungs every 4 (four) hours as needed for wheezing or shortness of breath. (Patient not taking: Reported on 01/22/2019)  . [DISCONTINUED] azithromycin (ZITHROMAX Z-PAK) 250 MG tablet 2 tab by mouth day 1, then 1 per day (Patient not taking: Reported on 01/22/2019)  . [DISCONTINUED] glucose blood (ONE TOUCH TEST STRIPS) test strip Use as directed once daily E11.9 (Patient not taking: Reported on 01/22/2019)  . [DISCONTINUED] Lancets MISC Use as directed once daily E11.9 (Patient not taking: Reported on 01/22/2019)  . [DISCONTINUED] metFORMIN (GLUCOPHAGE) 500 MG tablet Take 1 tablet (500 mg total) by mouth daily with breakfast. (Patient not taking: Reported on 01/22/2019)  . [DISCONTINUED] tizanidine (ZANAFLEX) 2 MG capsule Take 1 capsule (2 mg total) by mouth 3 (three) times daily as needed for muscle spasms. (Patient not taking: Reported on 01/22/2019)   No facility-administered encounter medications on file as of 01/22/2019.     Activities of Daily Living In your  present state of health, do you have any difficulty performing the following activities: 01/22/2019  Hearing? N  Vision? N  Difficulty concentrating or making decisions? N  Walking or climbing stairs? N  Dressing or bathing? N  Doing errands, shopping? N  Preparing Food and eating ? N  Using the Toilet? N  In the past six months, have you accidently leaked urine? N  Do you have problems with loss of bowel control? N  Managing your Medications? N  Managing your Finances? N  Housekeeping or managing your Housekeeping? N  Some recent data might be  hidden    Patient Care Team: Biagio Borg, MD as PCP - General (Internal Medicine) Earnie Larsson, MD as Consulting Physician (Neurosurgery) Lavonia Dana, MD as Referring Physician (Otolaryngology) Suella Broad, MD as Consulting Physician (Physical Medicine and Rehabilitation)   Assessment:   This is a routine wellness examination for Jonathan Lewis. Physical assessment deferred to PCP.  Exercise Activities and Dietary recommendations Current Exercise Habits: The patient does not participate in regular exercise at present, Exercise limited by: None identified  Diet (meal preparation, eat out, water intake, caffeinated beverages, dairy products, fruits and vegetables): in general, a "healthy" diet  , well balanced   Reviewed heart healthy and diabetic diet. Encouraged patient to increase daily water and healthy fluid intake. Diet education was attached to patient's AVS.   Goals    . Patient Stated     I want to increase my physical activity by walking more routinely.       Fall Risk Fall Risk  01/22/2019 05/15/2018 05/10/2017 11/03/2016 07/18/2014  Falls in the past year? 0 0 No No No  Number falls in past yr: 0 - - - -  Injury with Fall? 0 - - - -   Is the patient's home free of loose throw rugs in walkways, pet beds, electrical cords, etc?   yes      Grab bars in the bathroom? yes      Handrails on the stairs?   yes      Adequate  lighting?   yes  Depression Screen PHQ 2/9 Scores 01/22/2019 05/15/2018 05/10/2017 11/03/2016  PHQ - 2 Score 0 0 0 0    Cognitive Function MMSE - Mini Mental State Exam 01/22/2019  Not completed: Refused       Ad8 score reviewed for issues:  Issues making decisions: no  Less interest in hobbies / activities: no  Repeats questions, stories (family complaining): no  Trouble using ordinary gadgets (microwave, computer, phone):no  Forgets the month or year: no  Mismanaging finances: no  Remembering appts: no  Daily problems with thinking and/or memory: no Ad8 score is= 0    Immunization History  Administered Date(s) Administered  . Fluad Quad(high Dose 65+) 01/22/2019  . Influenza Split 01/10/2011, 01/11/2012  . Influenza Whole 01/26/2009, 01/01/2010  . Influenza, High Dose Seasonal PF 02/26/2013, 01/22/2015, 04/12/2016, 12/13/2016, 05/15/2018  . Influenza,inj,Quad PF,6+ Mos 01/16/2014  . Pneumococcal Conjugate-13 07/17/2013  . Pneumococcal Polysaccharide-23 02/15/2007  . Td 07/23/2007  . Tdap 05/15/2018   Screening Tests Health Maintenance  Topic Date Due  . INFLUENZA VACCINE  07/03/2019 (Originally 11/03/2018)  . OPHTHALMOLOGY EXAM  03/27/2019  . FOOT EXAM  05/16/2019  . HEMOGLOBIN A1C  06/04/2019  . COLONOSCOPY  06/28/2020  . TETANUS/TDAP  05/15/2028  . PNA vac Low Risk Adult  Completed       Plan:     Reviewed health maintenance screenings with patient today and relevant education, vaccines, and/or referrals were provided.   I have personally reviewed and noted the following in the patient's chart:   . Medical and social history . Use of alcohol, tobacco or illicit drugs  . Current medications and supplements . Functional ability and status . Nutritional status . Physical activity . Advanced directives . List of other physicians . Vitals . Screenings to include cognitive, depression, and falls . Referrals and appointments  In addition, I have  reviewed and discussed with patient certain preventive protocols, quality metrics, and best practice recommendations. A written personalized care plan for preventive services  as well as general preventive health recommendations were provided to patient.     Michiel Cowboy, RN  01/22/2019  Medical screening examination/treatment/procedure(s) were performed by non-physician practitioner and as supervising physician I was immediately available for consultation/collaboration. I agree with above. Cathlean Cower, MD

## 2019-01-22 ENCOUNTER — Other Ambulatory Visit: Payer: Self-pay

## 2019-01-22 ENCOUNTER — Telehealth: Payer: Self-pay | Admitting: *Deleted

## 2019-01-22 ENCOUNTER — Ambulatory Visit (INDEPENDENT_AMBULATORY_CARE_PROVIDER_SITE_OTHER): Payer: Medicare Other | Admitting: *Deleted

## 2019-01-22 VITALS — BP 126/80 | HR 61 | Resp 17 | Ht 68.0 in | Wt 185.0 lb

## 2019-01-22 DIAGNOSIS — Z0000 Encounter for general adult medical examination without abnormal findings: Secondary | ICD-10-CM

## 2019-01-22 DIAGNOSIS — Z23 Encounter for immunization: Secondary | ICD-10-CM | POA: Diagnosis not present

## 2019-01-22 MED ORDER — FAMOTIDINE 20 MG PO TABS: 20.0000 mg | ORAL_TABLET | Freq: Every day | ORAL | 3 refills | Status: DC

## 2019-01-22 MED ORDER — PANTOPRAZOLE SODIUM 40 MG PO TBEC: 40.0000 mg | DELAYED_RELEASE_TABLET | Freq: Two times a day (BID) | ORAL | 3 refills | Status: DC

## 2019-01-22 NOTE — Patient Instructions (Addendum)
Continue doing brain stimulating activities (puzzles, reading, adult coloring books, staying active) to keep memory sharp.   Continue to eat heart healthy diet (full of fruits, vegetables, whole grains, lean protein, water--limit salt, fat, and sugar intake) and increase physical activity as tolerated.   Jonathan Lewis , Thank you for taking time to come for your Medicare Wellness Visit. I appreciate your ongoing commitment to your health goals. Please review the following plan we discussed and let me know if I can assist you in the future.   These are the goals we discussed: Goals     Patient Stated     I want to increase my physical activity by walking more routinely.       This is a list of the screening recommended for you and due dates:  Health Maintenance  Topic Date Due   Flu Shot  07/03/2019*   Eye exam for diabetics  03/27/2019   Complete foot exam   05/16/2019   Hemoglobin A1C  06/04/2019   Colon Cancer Screening  06/28/2020   Tetanus Vaccine  05/15/2028   Pneumonia vaccines  Completed  *Topic was postponed. The date shown is not the original due date.     Food Choices for Gastroesophageal Reflux Disease, Adult When you have gastroesophageal reflux disease (GERD), the foods you eat and your eating habits are very important. Choosing the right foods can help ease your discomfort. Think about working with a nutrition specialist (dietitian) to help you make good choices. What are tips for following this plan?  Meals  Choose healthy foods that are low in fat, such as fruits, vegetables, whole grains, low-fat dairy products, and lean meat, fish, and poultry.  Eat small meals often instead of 3 large meals a day. Eat your meals slowly, and in a place where you are relaxed. Avoid bending over or lying down until 2-3 hours after eating.  Avoid eating meals 2-3 hours before bed.  Avoid drinking a lot of liquid with meals.  Cook foods using methods other than frying.  Bake, grill, or broil food instead.  Avoid or limit: ? Chocolate. ? Peppermint or spearmint. ? Alcohol. ? Pepper. ? Black and decaffeinated coffee. ? Black and decaffeinated tea. ? Bubbly (carbonated) soft drinks. ? Caffeinated energy drinks and soft drinks.  Limit high-fat foods such as: ? Fatty meat or fried foods. ? Whole milk, cream, butter, or ice cream. ? Nuts and nut butters. ? Pastries, donuts, and sweets made with butter or shortening.  Avoid foods that cause symptoms. These foods may be different for everyone. Common foods that cause symptoms include: ? Tomatoes. ? Oranges, lemons, and limes. ? Peppers. ? Spicy food. ? Onions and garlic. ? Vinegar. Lifestyle  Maintain a healthy weight. Ask your doctor what weight is healthy for you. If you need to lose weight, work with your doctor to do so safely.  Exercise for at least 30 minutes for 5 or more days each week, or as told by your doctor.  Wear loose-fitting clothes.  Do not smoke. If you need help quitting, ask your doctor.  Sleep with the head of your bed higher than your feet. Use a wedge under the mattress or blocks under the bed frame to raise the head of the bed. Summary  When you have gastroesophageal reflux disease (GERD), food and lifestyle choices are very important in easing your symptoms.  Eat small meals often instead of 3 large meals a day. Eat your meals slowly, and in a  place where you are relaxed.  Limit high-fat foods such as fatty meat or fried foods.  Avoid bending over or lying down until 2-3 hours after eating.  Avoid peppermint and spearmint, caffeine, alcohol, and chocolate. This information is not intended to replace advice given to you by your health care provider. Make sure you discuss any questions you have with your health care provider. Document Released: 09/20/2011 Document Revised: 07/12/2018 Document Reviewed: 04/26/2016 Elsevier Patient Education  [employer].  Diabetes Mellitus and Nutrition, Adult When you have diabetes (diabetes mellitus), it is very important to have healthy eating habits because your blood sugar (glucose) levels are greatly affected by what you eat and drink. Eating healthy foods in the appropriate amounts, at about the same times every day, can help you:  Control your blood glucose.  Lower your risk of heart disease.  Improve your blood pressure.  Reach or maintain a healthy weight. Every person with diabetes is different, and each person has different needs for a meal plan. Your health care provider may recommend that you work with a diet and nutrition specialist (dietitian) to make a meal plan that is best for you. Your meal plan may vary depending on factors such as:  The calories you need.  The medicines you take.  Your weight.  Your blood glucose, blood pressure, and cholesterol levels.  Your activity level.  Other health conditions you have, such as heart or kidney disease. How do carbohydrates affect me? Carbohydrates, also called carbs, affect your blood glucose level more than any other type of food. Eating carbs naturally raises the amount of glucose in your blood. Carb counting is a method for keeping track of how many carbs you eat. Counting carbs is important to keep your blood glucose at a healthy level, especially if you use insulin or take certain oral diabetes medicines. It is important to know how many carbs you can safely have in each meal. This is different for every person. Your dietitian can help you calculate how many carbs you should have at each meal and for each snack. Foods that contain carbs include:  Bread, cereal, rice, pasta, and crackers.  Potatoes and corn.  Peas, beans, and lentils.  Milk and yogurt.  Fruit and juice.  Desserts, such as cakes, cookies, ice cream, and candy. How does alcohol affect me? Alcohol can cause a sudden decrease in blood glucose (hypoglycemia),  especially if you use insulin or take certain oral diabetes medicines. Hypoglycemia can be a life-threatening condition. Symptoms of hypoglycemia (sleepiness, dizziness, and confusion) are similar to symptoms of having too much alcohol. If your health care provider says that alcohol is safe for you, follow these guidelines:  Limit alcohol intake to no more than 1 drink per day for nonpregnant women and 2 drinks per day for men. One drink equals 12 oz of beer, 5 oz of Harveen Flesch, or 1 oz of hard liquor.  Do not drink on an empty stomach.  Keep yourself hydrated with water, diet soda, or unsweetened iced tea.  Keep in mind that regular soda, juice, and other mixers may contain a lot of sugar and must be counted as carbs. What are tips for following this plan?  Reading food labels  Start by checking the serving size on the "Nutrition Facts" label of packaged foods and drinks. The amount of calories, carbs, fats, and other nutrients listed on the label is based on one serving of the item. Many items contain more than one  serving per package.  Check the total grams (g) of carbs in one serving. You can calculate the number of servings of carbs in one serving by dividing the total carbs by 15. For example, if a food has 30 g of total carbs, it would be equal to 2 servings of carbs.  Check the number of grams (g) of saturated and trans fats in one serving. Choose foods that have low or no amount of these fats.  Check the number of milligrams (mg) of salt (sodium) in one serving. Most people should limit total sodium intake to less than 2,300 mg per day.  Always check the nutrition information of foods labeled as "low-fat" or "nonfat". These foods may be higher in added sugar or refined carbs and should be avoided.  Talk to your dietitian to identify your daily goals for nutrients listed on the label. Shopping  Avoid buying canned, premade, or processed foods. These foods tend to be high in fat, sodium,  and added sugar.  Shop around the outside edge of the grocery store. This includes fresh fruits and vegetables, bulk grains, fresh meats, and fresh dairy. Cooking  Use low-heat cooking methods, such as baking, instead of high-heat cooking methods like deep frying.  Cook using healthy oils, such as olive, canola, or sunflower oil.  Avoid cooking with butter, cream, or high-fat meats. Meal planning  Eat meals and snacks regularly, preferably at the same times every day. Avoid going long periods of time without eating.  Eat foods high in fiber, such as fresh fruits, vegetables, beans, and whole grains. Talk to your dietitian about how many servings of carbs you can eat at each meal.  Eat 4-6 ounces (oz) of lean protein each day, such as lean meat, chicken, fish, eggs, or tofu. One oz of lean protein is equal to: ? 1 oz of meat, chicken, or fish. ? 1 egg. ?  cup of tofu.  Eat some foods each day that contain healthy fats, such as avocado, nuts, seeds, and fish. Lifestyle  Check your blood glucose regularly.  Exercise regularly as told by your health care provider. This may include: ? 150 minutes of moderate-intensity or vigorous-intensity exercise each week. This could be brisk walking, biking, or water aerobics. ? Stretching and doing strength exercises, such as yoga or weightlifting, at least 2 times a week.  Take medicines as told by your health care provider.  Do not use any products that contain nicotine or tobacco, such as cigarettes and e-cigarettes. If you need help quitting, ask your health care provider.  Work with a Social worker or diabetes educator to identify strategies to manage stress and any emotional and social challenges. Questions to ask a health care provider  Do I need to meet with a diabetes educator?  Do I need to meet with a dietitian?  What number can I call if I have questions?  When are the best times to check my blood glucose? Where to find more  information:  American Diabetes Association: diabetes.org  Academy of Nutrition and Dietetics: www.eatright.[employer] of Diabetes and Digestive and Kidney Diseases (NIH): [employer] Summary  A healthy meal plan will help you control your blood glucose and maintain a healthy lifestyle.  Working with a diet and nutrition specialist (dietitian) can help you make a meal plan that is best for you.  Keep in mind that carbohydrates (carbs) and alcohol have immediate effects on your blood glucose levels. It is important to count carbs and to  use alcohol carefully. This information is not intended to replace advice given to you by your health care provider. Make sure you discuss any questions you have with your health care provider. Document Released: 12/16/2004 Document Revised: 03/03/2017 Document Reviewed: 04/25/2016 Elsevier Patient Education  2020 [place] for Diabetes Mellitus, Adult  Carbohydrate counting is a method of keeping track of how many carbohydrates you eat. Eating carbohydrates naturally increases the amount of sugar (glucose) in the blood. Counting how many carbohydrates you eat helps keep your blood glucose within normal limits, which helps you manage your diabetes (diabetes mellitus). It is important to know how many carbohydrates you can safely have in each meal. This is different for every person. A diet and nutrition specialist (registered dietitian) can help you make a meal plan and calculate how many carbohydrates you should have at each meal and snack. Carbohydrates are found in the following foods:  Grains, such as breads and cereals.  Dried beans and soy products.  Starchy vegetables, such as potatoes, peas, and corn.  Fruit and fruit juices.  Milk and yogurt.  Sweets and snack foods, such as cake, cookies, candy, chips, and soft drinks. How do I count carbohydrates? There are two ways to count carbohydrates in  food. You can use either of the methods or a combination of both. Reading "Nutrition Facts" on packaged food The "Nutrition Facts" list is included on the labels of almost all packaged foods and beverages in the [place] It includes:  The serving size.  Information about nutrients in each serving, including the grams (g) of carbohydrate per serving. To use the Nutrition Facts":  Decide how many servings you will have.  Multiply the number of servings by the number of carbohydrates per serving.  The resulting number is the total amount of carbohydrates that you will be having. Learning standard serving sizes of other foods When you eat carbohydrate foods that are not packaged or do not include "Nutrition Facts" on the label, you need to measure the servings in order to count the amount of carbohydrates:  Measure the foods that you will eat with a food scale or measuring cup, if needed.  Decide how many standard-size servings you will eat.  Multiply the number of servings by 15. Most carbohydrate-rich foods have about 15 g of carbohydrates per serving. ? For example, if you eat 8 oz (170 g) of strawberries, you will have eaten 2 servings and 30 g of carbohydrates (2 servings x 15 g = 30 g).  For foods that have more than one food mixed, such as soups and casseroles, you must count the carbohydrates in each food that is included. The following list contains standard serving sizes of common carbohydrate-rich foods. Each of these servings has about 15 g of carbohydrates:   hamburger bun or  English muffin.   oz (15 mL) syrup.   oz (14 g) jelly.  1 slice of bread.  1 six-inch tortilla.  3 oz (85 g) cooked rice or pasta.  4 oz (113 g) cooked dried beans.  4 oz (113 g) starchy vegetable, such as peas, corn, or potatoes.  4 oz (113 g) hot cereal.  4 oz (113 g) mashed potatoes or  of a large baked potato.  4 oz (113 g) canned or frozen fruit.  4 oz (120 mL) fruit  juice.  4-6 crackers.  6 chicken nuggets.  6 oz (170 g) unsweetened dry cereal.  6 oz (170 g) plain fat-free yogurt  or yogurt sweetened with artificial sweeteners.  8 oz (240 mL) milk.  8 oz (170 g) fresh fruit or one small piece of fruit.  24 oz (680 g) popped popcorn. Example of carbohydrate counting Sample meal  3 oz (85 g) chicken breast.  6 oz (170 g) brown rice.  4 oz (113 g) corn.  8 oz (240 mL) milk.  8 oz (170 g) strawberries with sugar-free whipped topping. Carbohydrate calculation 1. Identify the foods that contain carbohydrates: ? Rice. ? Corn. ? Milk. ? Strawberries. 2. Calculate how many servings you have of each food: ? 2 servings rice. ? 1 serving corn. ? 1 serving milk. ? 1 serving strawberries. 3. Multiply each number of servings by 15 g: ? 2 servings rice x 15 g = 30 g. ? 1 serving corn x 15 g = 15 g. ? 1 serving milk x 15 g = 15 g. ? 1 serving strawberries x 15 g = 15 g. 4. Add together all of the amounts to find the total grams of carbohydrates eaten: ? 30 g + 15 g + 15 g + 15 g = 75 g of carbohydrates total. Summary  Carbohydrate counting is a method of keeping track of how many carbohydrates you eat.  Eating carbohydrates naturally increases the amount of sugar (glucose) in the blood.  Counting how many carbohydrates you eat helps keep your blood glucose within normal limits, which helps you manage your diabetes.  A diet and nutrition specialist (registered dietitian) can help you make a meal plan and calculate how many carbohydrates you should have at each meal and snack. This information is not intended to replace advice given to you by your health care provider. Make sure you discuss any questions you have with your health care provider. Document Released: 03/21/2005 Document Revised: 10/13/2016 Document Reviewed: 09/02/2015 Elsevier Patient Education  2020 [employer].

## 2019-01-22 NOTE — Telephone Encounter (Signed)
Ok for increased protonix 40 bid, and add pepcid 20 qhs, and to GI if not improved

## 2019-01-22 NOTE — Telephone Encounter (Signed)
During AWV, patient stated that he continues to have vomiting and reflux issues at night, with vomiting approximately once weekly.  During the day the patient explained that he does occasionally become a little nauseated, bloated, and belches. Jonathan Lewis stated that he did stop taking metformin, he is taking pantoprazole as prescribed and does not notice much difference in his symptoms. He would like to know if PCP would like him to do anything else to address his symptoms and/or make an appointment with PCP to discuss his symptoms further. Nurse did educate patient regarding acid reflux and gave the patient printout education regarding food choices for acid reflux.

## 2019-01-23 NOTE — Telephone Encounter (Signed)
Called patient and explained that PCP wants him to increase Protonix to 40 mg twice daily and to take Pepcid 20 mg before bedtime. Nurse stated that the Pepcid was called into his pharmacy yesterday and should be ready for pick-up. It was discussed that if this combination of medication was not effective to make an appointment with his GI doctor for further evaluation. Patient verbalized understanding and was appreciative for the callback.

## 2019-02-24 ENCOUNTER — Other Ambulatory Visit: Payer: Self-pay

## 2019-02-24 DIAGNOSIS — Z20822 Contact with and (suspected) exposure to covid-19: Secondary | ICD-10-CM

## 2019-02-25 LAB — NOVEL CORONAVIRUS, NAA: SARS-CoV-2, NAA: NOT DETECTED

## 2019-02-26 ENCOUNTER — Telehealth: Payer: Self-pay | Admitting: Internal Medicine

## 2019-02-26 NOTE — Telephone Encounter (Signed)
Negative COVID results given. Patient results "NOT Detected." Caller expressed understanding. ° °

## 2019-02-27 ENCOUNTER — Ambulatory Visit: Payer: Self-pay | Admitting: Internal Medicine

## 2019-02-27 NOTE — Telephone Encounter (Signed)
  Pt reports BS "Lower than usual." States usual range 103-110. Reports this am 53 after eating "Eggs and ham." Rechecked, 74. Prior to call 11. States ate "[employer]" 1 hour ago. Denies any symptoms; no weakness, no visual changes, no tremors, no headache or diaphoresis. Advised pt to drink 1 cup of milk and carbs as per care advise protocol and TN will CB. 1640:  Pt reports BS now 68. Reports no aymptoms hypoglycemia, states he daoes have "A Lot of gas and belching like when I saw Dr. Jenny Reichmann about that." Also repots "Burning upper stomach area" x 1-2 weeks after eating.  CAll transferred to practice, Demi, for consideration of appt. Care advise given as to symptoms that warrant an ED visit, diet, monitor BS.  Pt verbalizes understanding.   Reason for Disposition . [1] Blood glucose < 70 mg/dL (3.9 mmol/L) or symptomatic AND [2] cause known  Answer Assessment - Initial Assessment Questions 1. SYMPTOMS: "What symptoms are you concerned about?"     BS low. "Burning in chest" 2. ONSET:  "When did the symptoms start?"     This AM 3. BLOOD GLUCOSE: "What is your blood glucose level?"      53-74 4. USUAL RANGE: "What is your blood glucose level usually?" (e.g., usual fasting morning value, usual evening value)    103 5. TYPE 1 or 2:  "Do you know what type of diabetes you have?"  (e.g., Type 1, Type 2, Gestational; doesn't know)      6. INSULIN: "Do you take insulin?" "What type of insulin(s) do you use? What is the mode of delivery? (syringe, pen; injection or pump) "When did you last give yourself an insulin dose?" (i.e., time or hours/minutes ago) "How much did you give?" (i.e., how many units)    no 7. DIABETES PILLS: "Do you take any pills for your diabetes?"    no 8. OTHER SYMPTOMS: "Do you have any symptoms?" (e.g., fever, frequent urination, difficulty breathing, vomiting)     Gas, belching, constipation 9. LOW BLOOD GLUCOSE TREATMENT: "What have you done so far to treat the low blood  glucose level?"     nothing 10. FOOD: "When did you last eat or drink?"       1 hour ago, '[place] sausages" 11. ALONE: "Are you alone right now or is someone with you?"        With wife  Protocols used: DIABETES - LOW BLOOD SUGAR-A-AH

## 2019-03-01 ENCOUNTER — Ambulatory Visit (INDEPENDENT_AMBULATORY_CARE_PROVIDER_SITE_OTHER): Payer: Medicare Other | Admitting: Family Medicine

## 2019-03-01 ENCOUNTER — Other Ambulatory Visit: Payer: Self-pay

## 2019-03-01 DIAGNOSIS — E162 Hypoglycemia, unspecified: Secondary | ICD-10-CM

## 2019-03-01 DIAGNOSIS — R1013 Epigastric pain: Secondary | ICD-10-CM

## 2019-03-01 DIAGNOSIS — R634 Abnormal weight loss: Secondary | ICD-10-CM

## 2019-03-01 DIAGNOSIS — R630 Anorexia: Secondary | ICD-10-CM

## 2019-03-01 NOTE — Progress Notes (Signed)
This visit type was conducted due to national recommendations for restrictions regarding the COVID-19 pandemic in an effort to limit this patient's exposure and mitigate transmission in our community.   Virtual Visit via Telephone Note  I connected with Jonathan Lewis on 03/01/19 at  9:20 AM EST by telephone and verified that I am speaking with the correct person using two identifiers.   I discussed the limitations, risks, security and privacy concerns of performing an evaluation and management service by telephone and the availability of in person appointments. I also discussed with the patient that there may be a patient responsible charge related to this service. The patient expressed understanding and agreed to proceed. We attempted virtual video visit but for some reason had technical difficulties had to convert this to a phone note  Location patient: home Location provider: work or home office Participants present for the call: patient, provider Patient did not have a visit in the prior 7 days to address this/these issue(s).   History of Present Illness: Jonathan Lewis has chronic problems including hypertension, history of GERD, hyperglycemia (currently not on diabetic medication), history of acoustic neuroma, history of BPH.  He is seen with initial complaint of "low blood sugar ".  He had recent A1c of 6.1% back in September.  He states he had recent fasting blood sugar 51 and this has generally ranged 51-75.  He has not had any obvious hypoglycemic symptoms.  As above, he is not taking any glycemic medications.  On further questioning his main concern is that he states he has had some decreased appetite and weight loss along with some recurrent vomiting over the past several weeks.  He was seen back in September and had full lab evaluation which was essentially unremarkable and ultrasound at that time revealed no significant abnormalities.  Pancreas not well seen.  Lipase was normal  His  weight was 192 pounds back in August down to 185 pounds in October.  He thinks he has lost 5 additional pounds since then.  He states his waist size has gone from size 38-36 over recent months.  He denies any recent melena.  No hematemesis.  Does have decreased appetite and sometimes only eating 1 meal per day.  Frequent "burning" sensation substernal.  Drinks about 1 cup of coffee per day.  No regular alcohol.  Taking Protonix and Pepcid regularly.  No known history of ulcer.  Past Medical History:  Diagnosis Date  . Acoustic neuroma ([place]) 06/25/2015  . Allergic rhinitis 07/18/2014  . Arthritis   . BPH (benign prostatic hyperplasia)   . Carbuncle    recurrent MRSA carbuncles  . Cataract   . Diabetes mellitus    Type II  . Disk prolapse   . Glaucoma   . Hyperlipidemia   . Hypertension   . Male hypogonadism 07/16/2014  . Sinus bradycardia    chronic, asymptomatic  . Sinusitis 07/29/2012   Past Surgical History:  Procedure Laterality Date  . CATARACT EXTRACTION     x 2  . COLONOSCOPY    . POLYPECTOMY      reports that he quit smoking about 40 years ago. He has never used smokeless tobacco. He reports current alcohol use of about 5.0 standard drinks of alcohol per week. He reports that he does not use drugs. family history includes Cancer in his brother and mother; Diabetes in his sister; Hyperlipidemia in his brother; Hypertension in his brother, mother, and sister; Stroke in his brother. No Known Allergies  Wt Readings from Last 3 Encounters:  01/22/19 185 lb (83.9 kg)  12/05/18 191 lb (86.6 kg)  11/14/18 192 lb (87.1 kg)       Observations/Objective: Patient sounds cheerful and well on the phone. I do not appreciate any SOB. Speech and thought processing are grossly intact. Patient reported vitals:  Assessment and Plan:  #1 patient concern for "hypoglycemia.  ".  He has had reading as low as 51 by home monitor and is not currently taking any diabetic medications.  No  obvious hypoglycemic symptoms. Pancrease not well seen on recent ultrasound  -Continue to monitor fasting blood sugars and consider office follow-up with Dr. Jenny Reichmann for repeat fasting glucose with a least 8 to 10 hours fasting -we explained that insulinomas are rare, but may need CT abdomen if true hypoglycemia documented after several hour fast.  #2 intermittent vomiting and epigastric pain.  He also has reported decreased appetite and weight loss.  -Recommend further evaluation.  He is already on Protonix and Pepcid without much improvement. -discussed with patient and family consideration for GI referral, especially in view of recurrent vomiting, weight loss, decline in appetite and persistent reflux already on PPI and H-2 blocker.  Follow Up Instructions:  -close follow up with Dr Jenny Reichmann for repeat fasting glucose -GI referral to further assess above symptoms   [id number] [id number] [id number] 11-20 [id number] 21-30 I did not refer this patient for an OV in the next 24 hours for this/these issue(s).  I discussed the assessment and treatment plan with the patient. The patient was provided an opportunity to ask questions and all were answered. The patient agreed with the plan and demonstrated an understanding of the instructions.   The patient was advised to call back or seek an in-person evaluation if the symptoms worsen or if the condition fails to improve as anticipated.  I provided 25 minutes of non-face-to-face time during this encounter.   Carolann Littler, MD

## 2019-03-04 ENCOUNTER — Encounter: Payer: Self-pay | Admitting: Gastroenterology

## 2019-03-04 NOTE — Telephone Encounter (Signed)
Pt has been informed and expressed understanding.  

## 2019-03-04 NOTE — Telephone Encounter (Signed)
Pt was seen nov 27 per Dr Elease Hashimoto and I agree with his recommendations

## 2019-03-04 NOTE — Telephone Encounter (Signed)
Noted  

## 2019-03-05 ENCOUNTER — Other Ambulatory Visit: Payer: Self-pay

## 2019-03-05 ENCOUNTER — Other Ambulatory Visit (INDEPENDENT_AMBULATORY_CARE_PROVIDER_SITE_OTHER): Payer: Medicare Other

## 2019-03-05 ENCOUNTER — Ambulatory Visit (INDEPENDENT_AMBULATORY_CARE_PROVIDER_SITE_OTHER): Payer: Medicare Other | Admitting: Internal Medicine

## 2019-03-05 ENCOUNTER — Encounter: Payer: Self-pay | Admitting: Internal Medicine

## 2019-03-05 VITALS — BP 160/100 | HR 68 | Temp 98.3°F | Ht 68.0 in | Wt 175.0 lb

## 2019-03-05 DIAGNOSIS — R1013 Epigastric pain: Secondary | ICD-10-CM | POA: Diagnosis not present

## 2019-03-05 DIAGNOSIS — R630 Anorexia: Secondary | ICD-10-CM

## 2019-03-05 DIAGNOSIS — R112 Nausea with vomiting, unspecified: Secondary | ICD-10-CM

## 2019-03-05 DIAGNOSIS — R634 Abnormal weight loss: Secondary | ICD-10-CM

## 2019-03-05 LAB — URINALYSIS, ROUTINE W REFLEX MICROSCOPIC
Bilirubin Urine: NEGATIVE
Hgb urine dipstick: NEGATIVE
Leukocytes,Ua: NEGATIVE
Nitrite: NEGATIVE
RBC / HPF: NONE SEEN (ref 0–?)
Specific Gravity, Urine: >=1.03 — AB (ref 1.000–1.030)
Total Protein, Urine: NEGATIVE
Urine Glucose: NEGATIVE
Urobilinogen, UA: 1 (ref 0.0–1.0)
pH: 5.5 (ref 5.0–8.0)

## 2019-03-05 LAB — CBC WITH DIFFERENTIAL/PLATELET
Basophils Absolute: 0 10*3/uL (ref 0.0–0.1)
Basophils Relative: 0.5 % (ref 0.0–3.0)
Eosinophils Absolute: 0 10*3/uL (ref 0.0–0.7)
Eosinophils Relative: 1.2 % (ref 0.0–5.0)
HCT: 41.2 % (ref 39.0–52.0)
Hemoglobin: 13.5 g/dL (ref 13.0–17.0)
Lymphocytes Relative: 41.8 % (ref 12.0–46.0)
Lymphs Abs: 1.6 10*3/uL (ref 0.7–4.0)
MCHC: 32.7 g/dL (ref 30.0–36.0)
MCV: 86 fl (ref 78.0–100.0)
Monocytes Absolute: 0.5 10*3/uL (ref 0.1–1.0)
Monocytes Relative: 13.8 % — ABNORMAL HIGH (ref 3.0–12.0)
Neutro Abs: 1.6 10*3/uL (ref 1.4–7.7)
Neutrophils Relative %: 42.7 % — ABNORMAL LOW (ref 43.0–77.0)
Platelets: 195 10*3/uL (ref 150.0–400.0)
RBC: 4.79 Mil/uL (ref 4.22–5.81)
RDW: 13 % (ref 11.5–15.5)
WBC: 3.9 10*3/uL — ABNORMAL LOW (ref 4.0–10.5)

## 2019-03-05 LAB — LIPASE: Lipase: 26 U/L (ref 11.0–59.0)

## 2019-03-05 LAB — HEPATIC FUNCTION PANEL
ALT: 11 U/L (ref 0–53)
AST: 15 U/L (ref 0–37)
Albumin: 4 g/dL (ref 3.5–5.2)
Alkaline Phosphatase: 48 U/L (ref 39–117)
Bilirubin, Direct: 0.1 mg/dL (ref 0.0–0.3)
Total Bilirubin: 0.6 mg/dL (ref 0.2–1.2)
Total Protein: 6.9 g/dL (ref 6.0–8.3)

## 2019-03-05 LAB — BASIC METABOLIC PANEL
BUN: 17 mg/dL (ref 6–23)
CO2: 28 mEq/L (ref 19–32)
Calcium: 9.1 mg/dL (ref 8.4–10.5)
Chloride: 103 mEq/L (ref 96–112)
Creatinine, Ser: 1.13 mg/dL (ref 0.40–1.50)
GFR: 75.62 mL/min (ref >60.00)
Glucose, Bld: 100 mg/dL — ABNORMAL HIGH (ref 70–99)
Potassium: 4.1 mEq/L (ref 3.5–5.1)
Sodium: 137 mEq/L (ref 135–145)

## 2019-03-05 MED ORDER — ONDANSETRON HCL 4 MG PO TABS: 4.0000 mg | ORAL_TABLET | Freq: Three times a day (TID) | ORAL | 1 refills | Status: DC | PRN

## 2019-03-05 NOTE — Patient Instructions (Signed)
Unfortunately we still cannot say what the exact cause for your symptoms is (the pain, nausea, weight loss, low appetite)  Please take all new medication as prescribed - the nausea medication  Please continue all other medications as before, including the blood pressure medications as this is not likely related to your symptoms and your BP is high today  Please have the pharmacy call with any other refills you may need.  Please continue your efforts at being more active, low cholesterol diet, and weight control.  You are otherwise up to date with prevention measures today  Please keep your appointments with your specialists as you may have planned  You will be contacted regarding the referral for: CT scan for the abdomen/pelvis (to see our [facility] today to arrange)  Please go to the LAB in the Basement (turn left off the elevator) for the tests to be done today  You will be contacted by phone if any changes need to be made immediately.  Otherwise, you will receive a letter about your results with an explanation, but please check with MyChart first.  Please remember to sign up for MyChart if you have not done so, as this will be important to you in the future with finding out test results, communicating by private email, and scheduling acute appointments online when needed.  Please return in 1 months, or sooner if needed

## 2019-03-05 NOTE — Progress Notes (Signed)
Subjective:    [name], male    DOB: 1940-05-10, 78 y.o.   MRN: [id number]  HPI  Here with ongoing epigastric pain, nausea, weight loss, low appetite as described at ED recently;  he states he has had some decreased appetite and weight loss along with some recurrent vomiting over the past several weeks.  He was seen back in September and had full lab evaluation which was essentially unremarkable and ultrasound at that time revealed no significant abnormalities.  Pancreas not well seen.  Lipase was normal  His weight was 192 pounds back in August down to 185 pounds in October.  He thinks he has lost 5 additional pounds since then.  He states his waist size has gone from size 38-36 over recent months. He denies any recent melena.  No hematemesis.  Does have decreased appetite and sometimes only eating 1 meal per day.  Frequent "burning" sensation substernal.  No regular alcohol.  Taking Protonix and Pepcid regularly.  No known history of ulcer. Pt has felt like low sugars but no DM meds. Past Medical History:  Diagnosis Date  . Acoustic neuroma ([place]) 06/25/2015  . Allergic rhinitis 07/18/2014  . Arthritis   . BPH (benign prostatic hyperplasia)   . Carbuncle    recurrent MRSA carbuncles  . Cataract   . Diabetes mellitus    Type II  . Disk prolapse   . Glaucoma   . Hyperlipidemia   . Hypertension   . Male hypogonadism 07/16/2014  . Sinus bradycardia    chronic, asymptomatic  . Sinusitis 07/29/2012   Past Surgical History:  Procedure Laterality Date  . BIOPSY  03/08/2019   Procedure: BIOPSY;  Surgeon: Yetta Flock, MD;  Location: [facility];  Service: Gastroenterology;;  . CATARACT EXTRACTION     x 2  . COLONOSCOPY    . ESOPHAGOGASTRODUODENOSCOPY N/A 03/08/2019   Procedure: ESOPHAGOGASTRODUODENOSCOPY (EGD);  Surgeon: Yetta Flock, MD;  Location: [facility];  Service: Gastroenterology;  Laterality: N/A;  . FOREIGN BODY REMOVAL  03/08/2019   Procedure:  FOREIGN BODY REMOVAL;  Surgeon: Yetta Flock, MD;  Location: [facility];  Service: Gastroenterology;;  . POLYPECTOMY      reports that he quit smoking about 40 years ago. He has never used smokeless tobacco. He reports current alcohol use of about 5.0 standard drinks of alcohol per week. He reports that he does not use drugs. family history includes Cancer in his brother and mother; Diabetes in his sister; Hyperlipidemia in his brother; Hypertension in his brother, mother, and sister; Stroke in his brother. No Known Allergies No current facility-administered medications on file prior to visit.    Current Outpatient Medications on File Prior to Visit  Medication Sig Dispense Refill  . aspirin 81 MG tablet Take 81 mg by mouth daily.     . famotidine (PEPCID) 20 MG tablet Take 1 tablet (20 mg total) by mouth at bedtime. 90 tablet 3  . HYDROcodone-acetaminophen (NORCO/VICODIN) 5-325 MG tablet Take 1 tablet by mouth every 6 (six) hours as needed for moderate pain. (Patient not taking: Reported on 03/07/2019) 30 tablet 0  . latanoprost (XALATAN) 0.005 % ophthalmic solution Place 1 drop into both eyes at bedtime.     Marland Kitchen lisinopril (ZESTRIL) 10 MG tablet TAKE 1 TABLET BY MOUTH EVERY DAY (Patient taking differently: Take 10 mg by mouth daily. ) 90 tablet 2  . pantoprazole (PROTONIX) 40 MG tablet Take 1 tablet (40 mg total) by mouth 2 (  two) times daily before a meal. 180 tablet 3  . polyethylene glycol powder (GLYCOLAX/MIRALAX) 17 GM/SCOOP powder Take 17 g by mouth 2 (two) times daily as needed. (Patient taking differently: Take 17 g by mouth 2 (two) times daily as needed for mild constipation. ) 3350 g 1  . pravastatin (PRAVACHOL) 40 MG tablet TAKE 1 TABLET BY MOUTH EVERY DAY (Patient taking differently: Take 40 mg by mouth daily. ) 90 tablet 1  . terazosin (HYTRIN) 2 MG capsule TAKE 1 CAPSULE BY MOUTH EVERY DAY (Patient taking differently: Take 2 mg by mouth daily. ) 90 capsule 3     Review of  Systems  Constitutional: Negative for other unusual diaphoresis or sweats HENT: Negative for ear discharge or swelling Eyes: Negative for other worsening visual disturbances Respiratory: Negative for stridor or other swelling  Gastrointestinal: Negative for worsening distension or other blood Genitourinary: Negative for retention or other urinary change Musculoskeletal: Negative for other MSK pain or swelling Skin: Negative for color change or other new lesions Neurological: Negative for worsening tremors and other numbness  Psychiatric/Behavioral: Negative for worsening agitation or other fatigue All otherwise neg per pt     Objective:   Physical Exam BP (!) 160/100 (BP Location: Left Arm, Patient Position: Sitting, Cuff Size: Normal)   Pulse 68   Temp 98.3 F (36.8 C) (Oral)   Ht 5\' 8"  (1.727 m)   Wt 175 lb (79.4 kg)   SpO2 98%   BMI 26.61 kg/m  VS noted,  Constitutional: Pt appears in NAD HENT: Head: NCAT.  Right Ear: External ear normal.  Left Ear: External ear normal.  Eyes: . Pupils are equal, round, and reactive to light. Conjunctivae and EOM are normal Nose: without d/c or deformity Neck: Neck supple. Gross normal ROM Cardiovascular: Normal rate and regular rhythm.   Pulmonary/Chest: Effort normal and breath sounds without rales or wheezing.  Abd:  Soft, NT, ND, + BS, no organomegaly Neurological: Pt is alert. At baseline orientation, motor grossly intact Skin: Skin is warm. No rashes, other new lesions, no LE edema Psychiatric: Pt behavior is normal without agitation  All otherwise neg per pt   Lab Results  Component Value Date   WBC 3.4 (L) 12/05/2018   HGB 12.5 (L) 12/05/2018   HCT 37.3 (L) 12/05/2018   PLT 180.0 12/05/2018   GLUCOSE 82 12/05/2018   CHOL 160 05/14/2018   TRIG 140.0 05/14/2018   HDL 29.80 (L) 05/14/2018   LDLDIRECT 121.0 11/07/2017   LDLCALC 102 (H) 05/14/2018   ALT 11 12/05/2018   AST 19 12/05/2018   NA 140 12/05/2018   K 3.9  12/05/2018   CL 105 12/05/2018   CREATININE 0.98 12/05/2018   BUN 15 12/05/2018   CO2 31 12/05/2018   TSH 2.25 05/14/2018   PSA 1.76 05/14/2018   HGBA1C 6.1 12/05/2018   MICROALBUR <0.7 05/14/2018       Assessment & Plan:

## 2019-03-06 ENCOUNTER — Ambulatory Visit (INDEPENDENT_AMBULATORY_CARE_PROVIDER_SITE_OTHER)
Admission: RE | Admit: 2019-03-06 | Discharge: 2019-03-06 | Disposition: A | Discharge status: Home / Self Care | Payer: Medicare Other | Source: Ambulatory Visit | Attending: Internal Medicine | Admitting: Internal Medicine

## 2019-03-06 DIAGNOSIS — R1013 Epigastric pain: Secondary | ICD-10-CM

## 2019-03-06 DIAGNOSIS — R630 Anorexia: Secondary | ICD-10-CM | POA: Diagnosis not present

## 2019-03-06 DIAGNOSIS — R112 Nausea with vomiting, unspecified: Secondary | ICD-10-CM | POA: Diagnosis not present

## 2019-03-06 DIAGNOSIS — R634 Abnormal weight loss: Secondary | ICD-10-CM | POA: Diagnosis not present

## 2019-03-06 MED ORDER — IOHEXOL 300 MG/ML  SOLN
100.0000 mL | Freq: Once | INTRAMUSCULAR | Status: AC | PRN
  Administered 2019-03-06: 100 mL via INTRAVENOUS

## 2019-03-07 ENCOUNTER — Inpatient Hospital Stay (HOSPITAL_COMMUNITY): Payer: Medicare Other

## 2019-03-07 ENCOUNTER — Other Ambulatory Visit: Payer: Self-pay

## 2019-03-07 ENCOUNTER — Encounter (HOSPITAL_COMMUNITY): Payer: Self-pay

## 2019-03-07 ENCOUNTER — Inpatient Hospital Stay (HOSPITAL_COMMUNITY)
Admission: EM | Admit: 2019-03-07 | Discharge: 2019-04-04 | DRG: 327 | Disposition: A | Discharge status: Home Health | Payer: Medicare Other | Attending: Surgery | Admitting: Surgery

## 2019-03-07 DIAGNOSIS — Z833 Family history of diabetes mellitus: Secondary | ICD-10-CM | POA: Diagnosis not present

## 2019-03-07 DIAGNOSIS — E1159 Type 2 diabetes mellitus with other circulatory complications: Secondary | ICD-10-CM | POA: Diagnosis not present

## 2019-03-07 DIAGNOSIS — J309 Allergic rhinitis, unspecified: Secondary | ICD-10-CM | POA: Diagnosis present

## 2019-03-07 DIAGNOSIS — G8929 Other chronic pain: Secondary | ICD-10-CM | POA: Diagnosis present

## 2019-03-07 DIAGNOSIS — Z20828 Contact with and (suspected) exposure to other viral communicable diseases: Secondary | ICD-10-CM | POA: Diagnosis present

## 2019-03-07 DIAGNOSIS — Z800 Family history of malignant neoplasm of digestive organs: Secondary | ICD-10-CM

## 2019-03-07 DIAGNOSIS — C779 Secondary and unspecified malignant neoplasm of lymph node, unspecified: Secondary | ICD-10-CM | POA: Diagnosis present

## 2019-03-07 DIAGNOSIS — Z6824 Body mass index (BMI) 24.0-24.9, adult: Secondary | ICD-10-CM

## 2019-03-07 DIAGNOSIS — H409 Unspecified glaucoma: Secondary | ICD-10-CM | POA: Diagnosis present

## 2019-03-07 DIAGNOSIS — N400 Enlarged prostate without lower urinary tract symptoms: Secondary | ICD-10-CM | POA: Diagnosis present

## 2019-03-07 DIAGNOSIS — C169 Malignant neoplasm of stomach, unspecified: Secondary | ICD-10-CM | POA: Diagnosis present

## 2019-03-07 DIAGNOSIS — K2210 Ulcer of esophagus without bleeding: Secondary | ICD-10-CM | POA: Diagnosis present

## 2019-03-07 DIAGNOSIS — T182XXA Foreign body in stomach, initial encounter: Secondary | ICD-10-CM | POA: Diagnosis not present

## 2019-03-07 DIAGNOSIS — C163 Malignant neoplasm of pyloric antrum: Secondary | ICD-10-CM | POA: Diagnosis not present

## 2019-03-07 DIAGNOSIS — K311 Adult hypertrophic pyloric stenosis: Secondary | ICD-10-CM | POA: Diagnosis not present

## 2019-03-07 DIAGNOSIS — R110 Nausea: Secondary | ICD-10-CM

## 2019-03-07 DIAGNOSIS — K2950 Unspecified chronic gastritis without bleeding: Secondary | ICD-10-CM | POA: Diagnosis present

## 2019-03-07 DIAGNOSIS — E440 Moderate protein-calorie malnutrition: Secondary | ICD-10-CM | POA: Insufficient documentation

## 2019-03-07 DIAGNOSIS — K9413 Enterostomy malfunction: Secondary | ICD-10-CM | POA: Diagnosis not present

## 2019-03-07 DIAGNOSIS — K222 Esophageal obstruction: Secondary | ICD-10-CM | POA: Diagnosis present

## 2019-03-07 DIAGNOSIS — N200 Calculus of kidney: Secondary | ICD-10-CM | POA: Diagnosis present

## 2019-03-07 DIAGNOSIS — E1165 Type 2 diabetes mellitus with hyperglycemia: Secondary | ICD-10-CM | POA: Diagnosis present

## 2019-03-07 DIAGNOSIS — R112 Nausea with vomiting, unspecified: Secondary | ICD-10-CM | POA: Diagnosis not present

## 2019-03-07 DIAGNOSIS — Z7984 Long term (current) use of oral hypoglycemic drugs: Secondary | ICD-10-CM

## 2019-03-07 DIAGNOSIS — Z95828 Presence of other vascular implants and grafts: Secondary | ICD-10-CM

## 2019-03-07 DIAGNOSIS — E785 Hyperlipidemia, unspecified: Secondary | ICD-10-CM | POA: Diagnosis present

## 2019-03-07 DIAGNOSIS — K219 Gastro-esophageal reflux disease without esophagitis: Secondary | ICD-10-CM | POA: Diagnosis not present

## 2019-03-07 DIAGNOSIS — R202 Paresthesia of skin: Secondary | ICD-10-CM | POA: Diagnosis present

## 2019-03-07 DIAGNOSIS — Z79899 Other long term (current) drug therapy: Secondary | ICD-10-CM

## 2019-03-07 DIAGNOSIS — K315 Obstruction of duodenum: Secondary | ICD-10-CM

## 2019-03-07 DIAGNOSIS — I10 Essential (primary) hypertension: Secondary | ICD-10-CM | POA: Diagnosis not present

## 2019-03-07 DIAGNOSIS — E1142 Type 2 diabetes mellitus with diabetic polyneuropathy: Secondary | ICD-10-CM | POA: Diagnosis present

## 2019-03-07 DIAGNOSIS — Z794 Long term (current) use of insulin: Secondary | ICD-10-CM | POA: Diagnosis not present

## 2019-03-07 DIAGNOSIS — D630 Anemia in neoplastic disease: Secondary | ICD-10-CM | POA: Diagnosis present

## 2019-03-07 DIAGNOSIS — E291 Testicular hypofunction: Secondary | ICD-10-CM | POA: Diagnosis present

## 2019-03-07 DIAGNOSIS — Z8614 Personal history of Methicillin resistant Staphylococcus aureus infection: Secondary | ICD-10-CM

## 2019-03-07 DIAGNOSIS — K2100 Gastro-esophageal reflux disease with esophagitis, without bleeding: Secondary | ICD-10-CM | POA: Diagnosis present

## 2019-03-07 DIAGNOSIS — K209 Esophagitis, unspecified without bleeding: Secondary | ICD-10-CM | POA: Diagnosis not present

## 2019-03-07 DIAGNOSIS — Z9889 Other specified postprocedural states: Secondary | ICD-10-CM

## 2019-03-07 DIAGNOSIS — E119 Type 2 diabetes mellitus without complications: Secondary | ICD-10-CM

## 2019-03-07 DIAGNOSIS — Y838 Other surgical procedures as the cause of abnormal reaction of the patient, or of later complication, without mention of misadventure at the time of the procedure: Secondary | ICD-10-CM | POA: Diagnosis not present

## 2019-03-07 DIAGNOSIS — Z7982 Long term (current) use of aspirin: Secondary | ICD-10-CM

## 2019-03-07 DIAGNOSIS — Z7289 Other problems related to lifestyle: Secondary | ICD-10-CM

## 2019-03-07 DIAGNOSIS — R630 Anorexia: Secondary | ICD-10-CM | POA: Diagnosis not present

## 2019-03-07 DIAGNOSIS — Z87891 Personal history of nicotine dependence: Secondary | ICD-10-CM

## 2019-03-07 DIAGNOSIS — M545 Low back pain, unspecified: Secondary | ICD-10-CM | POA: Diagnosis present

## 2019-03-07 DIAGNOSIS — Z0189 Encounter for other specified special examinations: Secondary | ICD-10-CM

## 2019-03-07 DIAGNOSIS — Z8249 Family history of ischemic heart disease and other diseases of the circulatory system: Secondary | ICD-10-CM

## 2019-03-07 DIAGNOSIS — R634 Abnormal weight loss: Secondary | ICD-10-CM | POA: Diagnosis not present

## 2019-03-07 DIAGNOSIS — I152 Hypertension secondary to endocrine disorders: Secondary | ICD-10-CM

## 2019-03-07 DIAGNOSIS — K922 Gastrointestinal hemorrhage, unspecified: Secondary | ICD-10-CM | POA: Diagnosis not present

## 2019-03-07 LAB — COMPREHENSIVE METABOLIC PANEL
ALT: 15 U/L (ref 0–44)
AST: 20 U/L (ref 15–41)
Albumin: 4 g/dL (ref 3.5–5.0)
Alkaline Phosphatase: 44 U/L (ref 38–126)
Anion gap: 10 (ref 5–15)
BUN: 18 mg/dL (ref 8–23)
CO2: 27 mmol/L (ref 22–32)
Calcium: 8.7 mg/dL — ABNORMAL LOW (ref 8.9–10.3)
Chloride: 98 mmol/L (ref 98–111)
Creatinine, Ser: 1.11 mg/dL (ref 0.61–1.24)
GFR calc Af Amer: >60 mL/min (ref >60)
GFR calc non Af Amer: >60 mL/min (ref >60)
Glucose, Bld: 124 mg/dL — ABNORMAL HIGH (ref 70–99)
Potassium: 4.3 mmol/L (ref 3.5–5.1)
Sodium: 135 mmol/L (ref 135–145)
Total Bilirubin: 1.3 mg/dL — ABNORMAL HIGH (ref 0.3–1.2)
Total Protein: 7.1 g/dL (ref 6.5–8.1)

## 2019-03-07 LAB — CBC WITH DIFFERENTIAL/PLATELET
Abs Immature Granulocytes: 0.01 10*3/uL (ref 0.00–0.07)
Basophils Absolute: 0 10*3/uL (ref 0.0–0.1)
Basophils Relative: 0 %
Eosinophils Absolute: 0 10*3/uL (ref 0.0–0.5)
Eosinophils Relative: 0 %
HCT: 42.7 % (ref 39.0–52.0)
Hemoglobin: 13.6 g/dL (ref 13.0–17.0)
Immature Granulocytes: 0 %
Lymphocytes Relative: 21 %
Lymphs Abs: 1.1 10*3/uL (ref 0.7–4.0)
MCH: 27.6 pg (ref 26.0–34.0)
MCHC: 31.9 g/dL (ref 30.0–36.0)
MCV: 86.8 fL (ref 80.0–100.0)
Monocytes Absolute: 0.5 10*3/uL (ref 0.1–1.0)
Monocytes Relative: 10 %
Neutro Abs: 3.7 10*3/uL (ref 1.7–7.7)
Neutrophils Relative %: 69 %
Platelets: 188 10*3/uL (ref 150–400)
RBC: 4.92 MIL/uL (ref 4.22–5.81)
RDW: 12.2 % (ref 11.5–15.5)
WBC: 5.4 10*3/uL (ref 4.0–10.5)
nRBC: 0 % (ref 0.0–0.2)

## 2019-03-07 LAB — GLUCOSE, CAPILLARY
Glucose-Capillary: 70 mg/dL (ref 70–99)
Glucose-Capillary: 82 mg/dL (ref 70–99)

## 2019-03-07 LAB — HEMOGLOBIN A1C
Hgb A1c MFr Bld: 5.9 % — ABNORMAL HIGH (ref 4.8–5.6)
Mean Plasma Glucose: 122.63 mg/dL

## 2019-03-07 LAB — SARS CORONAVIRUS 2 (TAT 6-24 HRS): SARS Coronavirus 2: NEGATIVE

## 2019-03-07 LAB — LIPASE, BLOOD: Lipase: 30 U/L (ref 11–51)

## 2019-03-07 MED ORDER — ENOXAPARIN SODIUM 40 MG/0.4ML ~~LOC~~ SOLN: 40.0000 mg | SUBCUTANEOUS | Status: DC

## 2019-03-07 MED ORDER — PANTOPRAZOLE SODIUM 40 MG IV SOLR
40.0000 mg | Freq: Two times a day (BID) | INTRAVENOUS | Status: DC
  Administered 2019-03-07 – 2019-03-26 (×37): 40 mg via INTRAVENOUS
  Filled 2019-03-07 (×38): qty 40

## 2019-03-07 MED ORDER — INSULIN ASPART 100 UNIT/ML ~~LOC~~ SOLN
0.0000 [IU] | Freq: Three times a day (TID) | SUBCUTANEOUS | Status: DC
  Administered 2019-03-14: 1 [IU] via SUBCUTANEOUS
  Filled 2019-03-07: qty 0.09

## 2019-03-07 MED ORDER — HYDRALAZINE HCL 20 MG/ML IJ SOLN
5.0000 mg | Freq: Four times a day (QID) | INTRAMUSCULAR | Status: DC | PRN
  Administered 2019-03-21: 5 mg via INTRAVENOUS
  Filled 2019-03-07: qty 1

## 2019-03-07 MED ORDER — SODIUM CHLORIDE 0.9 % IV SOLN
INTRAVENOUS | Status: DC
  Administered 2019-03-07 – 2019-03-10 (×6): via INTRAVENOUS

## 2019-03-07 MED ORDER — SODIUM CHLORIDE 0.9 % IV SOLN
INTRAVENOUS | Status: DC
  Administered 2019-03-07 – 2019-03-12 (×3): via INTRAVENOUS

## 2019-03-07 MED ORDER — LIDOCAINE HCL URETHRAL/MUCOSAL 2 % EX GEL
CUTANEOUS | Status: AC
  Filled 2019-03-07: qty 30

## 2019-03-07 NOTE — Anesthesia Preprocedure Evaluation (Addendum)
Anesthesia Evaluation  Patient identified by MRN, date of birth, ID band Patient awake    Reviewed: Allergy & Precautions, NPO status , Patient's Chart, lab work & pertinent test results  Airway Mallampati: II  TM Distance: >3 FB Neck ROM: Full    Dental  (+) Teeth Intact, Dental Advisory Given   Pulmonary former smoker,    Pulmonary exam normal breath sounds clear to auscultation       Cardiovascular hypertension, Pt. on medications Normal cardiovascular exam Rhythm:Regular Rate:Normal     Neuro/Psych  Neuromuscular disease negative psych ROS   GI/Hepatic Neg liver ROS, GERD  Medicated,Gastric outlet obstruction   Endo/Other  diabetes, Type 2  Renal/GU negative Renal ROS     Musculoskeletal  (+) Arthritis ,   Abdominal   Peds  Hematology negative hematology ROS (+)   Anesthesia Other Findings Day of surgery medications reviewed with the patient.  Reproductive/Obstetrics                            Anesthesia Physical Anesthesia Plan  ASA: III  Anesthesia Plan: General   Post-op Pain Management:    Induction: Intravenous, Rapid sequence and Cricoid pressure planned  PONV Risk Score and Plan: 2 and Dexamethasone and Ondansetron  Airway Management Planned: Oral ETT  Additional Equipment:   Intra-op Plan:   Post-operative Plan: Extubation in OR  Informed Consent: I have reviewed the patients History and Physical, chart, labs and discussed the procedure including the risks, benefits and alternatives for the proposed anesthesia with the patient or authorized representative who has indicated his/her understanding and acceptance.     Dental advisory given  Plan Discussed with: CRNA  Anesthesia Plan Comments:        Anesthesia Quick Evaluation

## 2019-03-07 NOTE — H&P (Addendum)
History and Physical    Jonathan Lewis [id number] DOB: July 02, 1940 DOA: 03/07/2019  PCP: Biagio Borg, MD Patient coming from: Home  Chief Complaint: Pain with burping with nausea and vomiting  HPI: Jonathan Lewis is a 78 y.o. male with medical history significant of GERD, hypertension, hyperglycemia not on any diabetic medication, acoustic neuroma, BPH Admitted with complaints of persistent nausea vomiting and burping for the last 1 month. Patient had a televisit with his PCP on 1127 for hypoglycemia his blood sugar was 51-75 at home.  Patient has decreased appetite and lost 20 pounds in the last 1 month. He denies any hematuria hematochezia melena or hematemesis. His burping has become much frequent and painful so he came to the ER.  His last meal was yesterday night prior to admission that he vomited all out. He is on Protonix and Pepcid which has not helped his burping or discomfort. He lives at home with his wife. He denies any fever chills cough shortness of breath. He denies any urinary complaints He saw his PCP 2 days ago and a CT scan of the abdomen was ordered that showed gastric outlet obstruction from a possible mass.    ED Course: CT of the abdomen and pelvis 03/06/2019 showed-Marked distention of the stomach which is filled with food, fluid, and contrast material. This appearance is compatible with outlet obstruction or severe dysmotility. There is a 2.0 x 1.7 cm nodular focus of soft tissue density in the region of the pylorus and while this area is difficult to assess by CT, features certainly raise concern for mass lesion with secondary gastric outlet obstruction.  No evidence for lymphadenopathy or metastatic disease in the abdomen/pelvis.  Nonobstructing right renal stone.    Review of Systems: As per HPI otherwise all other systems reviewed and are negative  Ambulatory Status: He is ambulatory at baseline.  Past Medical History:  Diagnosis Date  .  Acoustic neuroma ([place]) 06/25/2015  . Allergic rhinitis 07/18/2014  . Arthritis   . BPH (benign prostatic hyperplasia)   . Carbuncle    recurrent MRSA carbuncles  . Cataract   . Diabetes mellitus    Type II  . Disk prolapse   . Glaucoma   . Hyperlipidemia   . Hypertension   . Male hypogonadism 07/16/2014  . Sinus bradycardia    chronic, asymptomatic  . Sinusitis 07/29/2012    Past Surgical History:  Procedure Laterality Date  . CATARACT EXTRACTION     x 2  . COLONOSCOPY    . POLYPECTOMY      Social History   Socioeconomic History  . Marital status: Married    Spouse name: Not on file  . Number of children: 7  . Years of education: 7  . Highest education level: Not on file  Occupational History  . Occupation: Mining engineer: RETIRED  Social Needs  . Financial resource strain: Not hard at all  . Food insecurity    Worry: Never true    Inability: Never true  . Transportation needs    Medical: No    Non-medical: No  Tobacco Use  . Smoking status: Former Smoker    Quit date: 10/03/1978    Years since quitting: 40.4  . Smokeless tobacco: Never Used  Substance and Sexual Activity  . Alcohol use: Yes    Alcohol/week: 5.0 standard drinks    Types: 5 Standard drinks or equivalent per week    Comment: pint, occasion beer  . Drug  use: No  . Sexual activity: Yes    Partners: Female  Lifestyle  . Physical activity    Days per week: 0 days    Minutes per session: 0 min  . Stress: Not at all  Relationships  . Social connections    Talks on phone: More than three times a week    Gets together: More than three times a week    Attends religious service: More than 4 times per year    Active member of club or organization: Yes    Attends meetings of clubs or organizations: More than 4 times per year    Relationship status: Married  . Intimate partner violence    Fear of current or ex partner: No    Emotionally abused: No    Physically abused: No    Forced sexual  activity: No  Other Topics Concern  . Not on file  Social History Narrative   9th grade.  Married '60. [name] 3 sons -'76 '64, '66; 4 dtrs -'60, '62, '61, '65; 15 grands, 25 great-grands.   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   ACD- discussed living will and HCPOA (July '14) provided packet.           No Known Allergies  Family History  Problem Relation Age of Onset  . Cancer Mother   . Hypertension Mother   . Diabetes Sister   . Cancer Brother        colon  . Hypertension Brother   . Hyperlipidemia Brother   . Stroke Brother   . Hypertension Sister   . Colon cancer Neg Hx   . Heart attack Neg Hx   . Sudden death Neg Hx     Prior to Admission medications   Medication Sig Start Date End Date Taking? Authorizing Provider  aspirin 81 MG tablet Take 81 mg by mouth daily.    Yes [provider]  famotidine (PEPCID) 20 MG tablet Take 1 tablet (20 mg total) by mouth at bedtime. 01/22/19   Biagio Borg, MD  gabapentin (NEURONTIN) 100 MG capsule Take 100 mg by mouth 2 (two) times daily. 12/13/18   [provider]  HYDROcodone-acetaminophen (NORCO/VICODIN) 5-325 MG tablet Take 1 tablet by mouth every 6 (six) hours as needed for moderate pain. 01/02/18   Biagio Borg, MD  latanoprost (XALATAN) 0.005 % ophthalmic solution INSTILL 1 DROP INTO BOTH EYES IN THE EVENING 01/09/19   [provider]  lisinopril (ZESTRIL) 10 MG tablet TAKE 1 TABLET BY MOUTH EVERY DAY 08/13/18   Biagio Borg, MD  metFORMIN (GLUCOPHAGE) 500 MG tablet Take 500 mg by mouth daily. 03/02/19   [provider]  ondansetron (ZOFRAN) 4 MG tablet Take 1 tablet (4 mg total) by mouth every 8 (eight) hours as needed for nausea or vomiting. 03/05/19   Biagio Borg, MD  pantoprazole (PROTONIX) 40 MG tablet Take 1 tablet (40 mg total) by mouth 2 (two) times daily before a meal. 01/22/19   Biagio Borg, MD  polyethylene glycol powder (GLYCOLAX/MIRALAX) 17 GM/SCOOP powder Take 17 g  by mouth 2 (two) times daily as needed. 12/05/18   Biagio Borg, MD  pravastatin (PRAVACHOL) 40 MG tablet TAKE 1 TABLET BY MOUTH EVERY DAY 06/18/18   Biagio Borg, MD  terazosin (HYTRIN) 2 MG capsule TAKE 1 CAPSULE BY MOUTH EVERY DAY 08/17/18   Biagio Borg, MD    Physical Exam: Vitals:   03/07/19 [id number] 03/07/19 [id number]  BP: 109/66   Pulse: 78   Resp: 18   Temp: 97.9 F (36.6 C)   TempSrc: Oral   SpO2: 100%   Weight:  79.4 kg  Height:  5\' 8"  (1.727 m)     . General:  Appears calm and comfortable . Eyes:  PERRL, EOMI, normal lids, iris . ENT:  grossly normal hearing, lips & tongue, mmm . Neck: no LAD, masses or thyromegaly . Cardiovascular:  RRR, no m/r/g. No LE edema.  Marland Kitchen Respiratory:  CTA bilaterally, no w/r/r. Normal respiratory effort. . Abdomen: soft,NABS distended . Skin:  no rash or induration seen on limited exam . Musculoskeletal:  grossly normal tone BUE/BLE, good ROM, no bony abnormality . Psychiatric: grossly normal mood and affect, speech fluent and appropriate, AOx3 . Neurologic: CN 2-12 grossly intact, moves all extremities in coordinated fashion, sensation intact  Labs on Admission: I have personally reviewed following labs and imaging studies  CBC: Recent Labs  Lab 03/05/19 1542 03/07/19 1000  WBC 3.9* 5.4  NEUTROABS 1.6 3.7  HGB 13.5 13.6  HCT 41.2 42.7  MCV 86.0 86.8  PLT 195.0 0000000   Basic Metabolic Panel: Recent Labs  Lab 03/05/19 1542 03/07/19 1000  NA 137 135  K 4.1 4.3  CL 103 98  CO2 28 27  GLUCOSE 100* 124*  BUN 17 18  CREATININE 1.13 1.11  CALCIUM 9.1 8.7*   GFR: Estimated Creatinine Clearance: 52.2 mL/min (by C-G formula based on SCr of 1.11 mg/dL). Liver Function Tests: Recent Labs  Lab 03/05/19 1542 03/07/19 1000  AST 15 20  ALT 11 15  ALKPHOS 48 44  BILITOT 0.6 1.3*  PROT 6.9 7.1  ALBUMIN 4.0 4.0   Recent Labs  Lab 03/05/19 1542 03/07/19 1000  LIPASE 26.0 30   No results for input(s): AMMONIA in the last 168 hours.  Coagulation Profile: No results for input(s): INR, PROTIME in the last 168 hours. Cardiac Enzymes: No results for input(s): CKTOTAL, CKMB, CKMBINDEX, TROPONINI in the last 168 hours. BNP (last 3 results) No results for input(s): PROBNP in the last 8760 hours. HbA1C: No results for input(s): HGBA1C in the last 72 hours. CBG: No results for input(s): GLUCAP in the last 168 hours. Lipid Profile: No results for input(s): CHOL, HDL, LDLCALC, TRIG, CHOLHDL, LDLDIRECT in the last 72 hours. Thyroid Function Tests: No results for input(s): TSH, T4TOTAL, FREET4, T3FREE, THYROIDAB in the last 72 hours. Anemia Panel: No results for input(s): VITAMINB12, FOLATE, FERRITIN, TIBC, IRON, RETICCTPCT in the last 72 hours. Urine analysis:    Component Value Date/Time   COLORURINE YELLOW 03/05/2019 1617   APPEARANCEUR CLEAR 03/05/2019 1617   LABSPEC >=1.030 (A) 03/05/2019 1617   PHURINE 5.5 03/05/2019 1617   GLUCOSEU NEGATIVE 03/05/2019 1617   HGBUR NEGATIVE 03/05/2019 1617   HGBUR negative 11/30/2009 1450   BILIRUBINUR NEGATIVE 03/05/2019 1617   KETONESUR TRACE (A) 03/05/2019 1617   PROTEINUR NEGATIVE 05/05/2015 1838   UROBILINOGEN 1.0 03/05/2019 1617   NITRITE NEGATIVE 03/05/2019 1617   LEUKOCYTESUR NEGATIVE 03/05/2019 1617    Creatinine Clearance: Estimated Creatinine Clearance: 52.2 mL/min (by C-G formula based on SCr of 1.11 mg/dL).  Sepsis Labs: @LABRCNTIP (procalcitonin:4,lacticidven:4) )No results found for this or any previous visit (from the past 240 hour(s)).   Radiological Exams on Admission: Ct Abdomen Pelvis W Contrast  Result Date: 03/06/2019 CLINICAL DATA:  Epigastric pain with nausea and vomiting. Decreased appetite. EXAM: CT ABDOMEN AND PELVIS WITH CONTRAST TECHNIQUE: Multidetector CT imaging of the abdomen and pelvis  was performed using the standard protocol following bolus administration of intravenous contrast. CONTRAST:  176mL OMNIPAQUE IOHEXOL 300 MG/ML  SOLN COMPARISON:   05/07/2010 FINDINGS: Lower chest: Unremarkable. Hepatobiliary: 4 mm hypodensity in the dome of liver is too small to characterize but is probably benign. This may be a tiny cyst. There is no evidence for gallstones, gallbladder wall thickening, or pericholecystic fluid. No intrahepatic or extrahepatic biliary dilation. Pancreas: Pancreas is diffusely atrophic without main duct dilatation. Spleen: Insert no splenomegaly Adrenals/Urinary Tract: No adrenal nodule or mass. 4 x 9 x 7 mm nonobstructing stone is identified interpolar right kidney. Kidneys otherwise unremarkable. No evidence for hydroureter. The urinary bladder appears normal for the degree of distention. Stomach/Bowel: The stomach is markedly distended with food, fluid, and contrast material. 2.0 x 1.7 cm nodular focus of soft tissue attenuation is identified in the region of the pylorus. This nodular soft tissue density is identifiable in all 3 planes. Duodenum is decompressed. Small bowel is largely decompressed without wall thickening. The terminal ileum is normal. The appendix is normal. No gross colonic mass. No colonic wall thickening. Vascular/Lymphatic: No abdominal aortic aneurysm. There is no gastrohepatic or hepatoduodenal ligament lymphadenopathy. No intraperitoneal or retroperitoneal lymphadenopathy. No pelvic sidewall lymphadenopathy. Reproductive: Prostate gland appears mildly enlarged. Other: No intraperitoneal free fluid. Musculoskeletal: Small left groin hernia contains only fat. Small umbilical hernia contains only fat. No worrisome lytic or sclerotic osseous abnormality. IMPRESSION: 1. Marked distention of the stomach which is filled with food, fluid, and contrast material. This appearance is compatible with outlet obstruction or severe dysmotility. There is a 2.0 x 1.7 cm nodular focus of soft tissue density in the region of the pylorus and while this area is difficult to assess by CT, features certainly raise concern for mass lesion  with secondary gastric outlet obstruction. 2. No evidence for lymphadenopathy or metastatic disease in the abdomen/pelvis. 3. Nonobstructing right renal stone. These results will be called to the ordering clinician or representative by the Radiologist Assistant, and communication documented in the PACS or zVision Dashboard. Electronically Signed   By: Misty Stanley M.D.   On: 03/06/2019 14:42     Assessment/Plan Active Problems:   * No active hospital problems. * #1 gastric outlet obstruction-patient admitted with frequent burping nausea vomiting unable to keep anything down with CT findings suggestive of gastric outlet obstruction.  ED physician has called consulted with GI and general surgery. Will keep patient n.p.o. Place NG tube  #2 hypertension on lisinopril 10 mg daily at home which will be on hold as patient is n.p.o. will start as needed hydralazine.  #3 type 2 diabetes on Glucophage 500 mg daily hold.  #4 glaucoma continue Xalatan eyedrops  #5 GERD on Protonix twice a day  #6 hyperlipidemia on Pravachol  #7 BPH on Hytrin.  Miscellaneous all his p.o. meds will be held as he is n.p.o. and has an NG tube to be placed.   Severity of Illness: The appropriate patient status for this patient is INPATIENT. Inpatient status is judged to be reasonable and necessary in order to provide the required intensity of service to ensure the patient's safety. The patient's presenting symptoms, physical exam findings, and initial radiographic and laboratory data in the context of their chronic comorbidities is felt to place them at high risk for further clinical deterioration. Furthermore, it is not anticipated that the patient will be medically stable for discharge from the hospital within 2 midnights of admission. The following factors support the patient status  of inpatient.   " The patient's presenting symptoms include unable to keep any food down nausea vomiting and frequent burping and burning  burping. " The worrisome physical exam findings include dehydration abdominal distention. " The initial radiographic and laboratory data are worrisome because of gastric outlet obstruction " The chronic co-morbidities include type 2 diabetes, hypertension   * I certify that at the point of admission it is my clinical judgment that the patient will require inpatient hospital care spanning beyond 2 midnights from the point of admission due to high intensity of service, high risk for further deterioration and high frequency of surveillance required.*   Estimated body mass index is 26.61 kg/m as calculated from the following:   Height as of this encounter: 5\' 8"  (1.727 m).   Weight as of this encounter: 79.4 kg.   DVT prophylaxis: Lovenox on hold for procedure EGD tomorrow with possible biopsy Code Status: Full code Family Communication: None Disposition Plan: Pending clinical improvement Consults called: ED physician discussed with GI and general surgery Admission status: Inpatient   Georgette Shell MD [facility]  If 7PM-7AM, please contact night-coverage [email or web address] Password [email or web address]  03/07/2019, 12:45 PM

## 2019-03-07 NOTE — Consult Note (Addendum)
Jonathan Lewis 1940-09-27  [id number].    Requesting MD: Dr. Lacretia Leigh Chief Complaint/Reason for Consult: GOO  HPI:   This is a pleasant 78 yo black male with a history of HTN and diabetes mellitus who has actually been taken off of his Metformin secondary to his recent issues with nausea and vomiting. For the last at least 1 month if not slightly longer he has been having issues with reflux as well as intermittent nausea and vomiting. He has had decrease in his oral intake and because of this his blood sugars have been lower and he has not required his Metformin. He has had a decrease in his bowel movements. He is still able to pass a little bit of flatus. He denies any blood in his stool or his vomitus. Over the last several days this has been worsening. When he lays down to go to sleep it worsens and that is when he has the most events of emesis.   He followed up with his primary care provider yesterday. He was sent for an outpatient CT scan. This revealed evidence of significant gastric distention likely as a result of a gastric outlet obstruction. There is a 2 x 1.7 cm mass noted near the pylorus but it is unable to be determined that this is the definitive etiology of his obstruction. He was referred to the [facility] emergency department for further evaluation, admission, and management. We have been asked to evaluate the patient for further recommendations.  ROS: ROS: Please see HPI, otherwise all other systems have been reviewed and are negative. He has had 2 colonoscopies in the past with polyps but never had an upper endoscopy.  Family History  Problem Relation Age of Onset  . Cancer Mother   . Hypertension Mother   . Diabetes Sister   . Cancer Brother        colon  . Hypertension Brother   . Hyperlipidemia Brother   . Stroke Brother   . Hypertension Sister   . Colon cancer Neg Hx   . Heart attack Neg Hx   . Sudden death Neg Hx     Past Medical History:   Diagnosis Date  . Acoustic neuroma ([place]) 06/25/2015  . Allergic rhinitis 07/18/2014  . Arthritis   . BPH (benign prostatic hyperplasia)   . Carbuncle    recurrent MRSA carbuncles  . Cataract   . Diabetes mellitus    Type II  . Disk prolapse   . Glaucoma   . Hyperlipidemia   . Hypertension   . Male hypogonadism 07/16/2014  . Sinus bradycardia    chronic, asymptomatic  . Sinusitis 07/29/2012    Past Surgical History:  Procedure Laterality Date  . CATARACT EXTRACTION     x 2  . COLONOSCOPY    . POLYPECTOMY      Social History:  reports that he quit smoking about 40 years ago. He has never used smokeless tobacco. He reports current alcohol use of about 5.0 standard drinks of alcohol per week. He reports that he does not use drugs.  Allergies: No Known Allergies  (Not in a hospital admission)    Physical Exam: Blood pressure 109/66, pulse 78, temperature 97.9 F (36.6 C), temperature source Oral, resp. rate 18, height 5\' 8"  (1.727 m), weight 79.4 kg, SpO2 100 %. General: pleasant, WD, WN black male who is laying in bed in NAD HEENT: head is normocephalic, atraumatic.  Sclera are noninjected.  PERRL.  Ears and  nose without any masses or lesions.  Mouth is pink and moist Heart: regular, rate, and rhythm.  Normal s1,s2. No obvious murmurs, gallops, or rubs noted.  Palpable radial and pedal pulses bilaterally Lungs: CTAB, no wheezes, rhonchi, or rales noted.  Respiratory effort nonlabored Abd: soft, NT, mild upper abdominal distention, +BS, no masses, hernias, or organomegaly MS: all 4 extremities are symmetrical with no cyanosis, clubbing, or edema. Skin: warm and dry with no masses, lesions, or rashes Psych: A&Ox3 with an appropriate affect.   Results for orders placed or performed during the hospital encounter of 03/07/19 (from the past 48 hour(s))  CBC with Differential     Status: None   Collection Time: 03/07/19 10:00 AM  Result Value Ref Range   WBC 5.4 4.0 - 10.5 K/uL    RBC 4.92 4.22 - 5.81 MIL/uL   Hemoglobin 13.6 13.0 - 17.0 g/dL   HCT 42.7 39.0 - 52.0 %   MCV 86.8 80.0 - 100.0 fL   MCH 27.6 26.0 - 34.0 pg   MCHC 31.9 30.0 - 36.0 g/dL   RDW 12.2 11.5 - 15.5 %   Platelets 188 150 - 400 K/uL   nRBC 0.0 0.0 - 0.2 %   Neutrophils Relative % 69 %   Neutro Abs 3.7 1.7 - 7.7 K/uL   Lymphocytes Relative 21 %   Lymphs Abs 1.1 0.7 - 4.0 K/uL   Monocytes Relative 10 %   Monocytes Absolute 0.5 0.1 - 1.0 K/uL   Eosinophils Relative 0 %   Eosinophils Absolute 0.0 0.0 - 0.5 K/uL   Basophils Relative 0 %   Basophils Absolute 0.0 0.0 - 0.1 K/uL   Immature Granulocytes 0 %   Abs Immature Granulocytes 0.01 0.00 - 0.07 K/uL    Comment: Performed at [facility], [place] [address], [place], [place] [address]  LIPASE     Status: None   Collection Time: 03/07/19 10:00 AM  Result Value Ref Range   Lipase 30 11 - 51 U/L    Comment: Performed at [facility], [place] [address] [place], [place] [address]  CMET     Status: Abnormal   Collection Time: 03/07/19 10:00 AM  Result Value Ref Range   Sodium 135 135 - 145 mmol/L   Potassium 4.3 3.5 - 5.1 mmol/L   Chloride 98 98 - 111 mmol/L   CO2 27 22 - 32 mmol/L   Glucose, Bld 124 (H) 70 - 99 mg/dL   BUN 18 8 - 23 mg/dL   Creatinine, Ser 1.11 0.61 - 1.24 mg/dL   Calcium 8.7 (L) 8.9 - 10.3 mg/dL   Total Protein 7.1 6.5 - 8.1 g/dL   Albumin 4.0 3.5 - 5.0 g/dL   AST 20 15 - 41 U/L   ALT 15 0 - 44 U/L   Alkaline Phosphatase 44 38 - 126 U/L   Total Bilirubin 1.3 (H) 0.3 - 1.2 mg/dL   GFR calc non Af Amer >60 >60 mL/min   GFR calc Af Amer >60 >60 mL/min   Anion gap 10 5 - 15    Comment: Performed at [facility], [place] [address] [place], [place] [address]   Ct Abdomen Pelvis W Contrast  Result Date: 03/06/2019 CLINICAL DATA:  Epigastric pain with nausea and vomiting. Decreased appetite. EXAM: CT ABDOMEN AND PELVIS WITH CONTRAST TECHNIQUE: Multidetector CT imaging  of the abdomen and pelvis was performed using the standard protocol following bolus administration of intravenous contrast. CONTRAST:  144mL OMNIPAQUE IOHEXOL 300 MG/ML  SOLN COMPARISON:  05/07/2010 FINDINGS: Lower chest: Unremarkable. Hepatobiliary: 4 mm hypodensity in the dome of liver is too small to characterize but is probably benign. This may be a tiny cyst. There is no evidence for gallstones, gallbladder wall thickening, or pericholecystic fluid. No intrahepatic or extrahepatic biliary dilation. Pancreas: Pancreas is diffusely atrophic without main duct dilatation. Spleen: Insert no splenomegaly Adrenals/Urinary Tract: No adrenal nodule or mass. 4 x 9 x 7 mm nonobstructing stone is identified interpolar right kidney. Kidneys otherwise unremarkable. No evidence for hydroureter. The urinary bladder appears normal for the degree of distention. Stomach/Bowel: The stomach is markedly distended with food, fluid, and contrast material. 2.0 x 1.7 cm nodular focus of soft tissue attenuation is identified in the region of the pylorus. This nodular soft tissue density is identifiable in all 3 planes. Duodenum is decompressed. Small bowel is largely decompressed without wall thickening. The terminal ileum is normal. The appendix is normal. No gross colonic mass. No colonic wall thickening. Vascular/Lymphatic: No abdominal aortic aneurysm. There is no gastrohepatic or hepatoduodenal ligament lymphadenopathy. No intraperitoneal or retroperitoneal lymphadenopathy. No pelvic sidewall lymphadenopathy. Reproductive: Prostate gland appears mildly enlarged. Other: No intraperitoneal free fluid. Musculoskeletal: Small left groin hernia contains only fat. Small umbilical hernia contains only fat. No worrisome lytic or sclerotic osseous abnormality. IMPRESSION: 1. Marked distention of the stomach which is filled with food, fluid, and contrast material. This appearance is compatible with outlet obstruction or severe dysmotility.  There is a 2.0 x 1.7 cm nodular focus of soft tissue density in the region of the pylorus and while this area is difficult to assess by CT, features certainly raise concern for mass lesion with secondary gastric outlet obstruction. 2. No evidence for lymphadenopathy or metastatic disease in the abdomen/pelvis. 3. Nonobstructing right renal stone. These results will be called to the ordering clinician or representative by the Radiologist Assistant, and communication documented in the PACS or zVision Dashboard. Electronically Signed   By: Misty Stanley M.D.   On: 03/06/2019 14:42    Assessment/Plan Hypertension Diabetes mellitus  Gastric outlet obstruction  The patient appears to have evidence of a gastric outlet obstruction on his CT scan. It is unclear whether the small mass that his pylorus is the definitive etiology. He will need an NG tube placed for decompression. Gastroenterology has been consulted for an upper endoscopy to further evaluate this area. Depending on the findings as well as potential pathology of this lesion will determine what type of surgical intervention the patient may need moving forward. For now, as stated above he will need an NG tube with bowel rest. We will continue to follow the patient and await further work-up prior to surgical intervention.   This plan has been discussed with the patient as well as his CAT scan findings. The patient seems to understand. All questions were encouraged and answered.   FEN -n.p.o./NG tube/IV fluids VTE -okay for chemical prophylaxis from our standpoint ID -none currently needed  Henreitta Cea, [facility] Surgery 03/07/2019, 1:09 PM Please see Amion for pager number during day hours 7:00am-4:30pm  Agree with above.  Seen by Dr. Havery Moros for GI.  Has lost 15-20 pounds over the last several months. Married.  He has 7 children and 6 live locally. He is the [place] [employer].  Alphonsa Overall, MD, [facility] Surgery Office phone:  [phone]

## 2019-03-07 NOTE — H&P (View-Only) (Signed)
Referring Provider:  Dr. Zenia Resides, [place] Primary Care Physician:  Biagio Borg, MD Primary Gastroenterologist:  Dr. Havery Moros  Reason for Consultation:  GOO/?gastric mass  HPI: Jonathan Lewis is a 78 y.o. male with past medical history of hypertension, hyperlipidemia, type 2 diabetes mellitus who presented to [facility] with complaints of nausea, vomiting, and upper abdominal discomfort.  CT scan of the abdomen/pelvis with contrast showed the following:  IMPRESSION: 1. Marked distention of the stomach which is filled with food, fluid, and contrast material. This appearance is compatible with outlet obstruction or severe dysmotility. There is a 2.0 x 1.7 cm nodular focus of soft tissue density in the region of the pylorus and while this area is difficult to assess by CT, features certainly raise concern for mass lesion with secondary gastric outlet obstruction. 2. No evidence for lymphadenopathy or metastatic disease in the abdomen/pelvis. 3. Nonobstructing right renal stone.  Surgery has seen the patient as well.  Order for NG tube has been placed, but the NG tube itself is not in yet.  He tells me that he has just been feeling not right for the past few months.  Over the past month though his symptoms have significantly worsened.  Has episodic nausea and vomiting.  No significant pain, just a fullness sensation and then burning up into his chest.  A lot of belching.  Has lost about 20 pounds recently.  PCP had been treating him with different medications such as pantoprazole and pepcid, but they were not helping so he finally had an outpatient CT scan yesterday.  Due to the results he was sent to the emergency department.  Labs all look good.  He denies any NSAID use.   Past Medical History:  Diagnosis Date  . 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 Allergic rhinitis 07/18/2014  . Arthritis   . BPH (benign prostatic hyperplasia)   . Carbuncle    recurrent MRSA carbuncles  .  Cataract   . Diabetes mellitus    Type II  . Disk prolapse   . Glaucoma   . Hyperlipidemia   . Hypertension   . Male hypogonadism 07/16/2014  . Sinus bradycardia    chronic, asymptomatic  . Sinusitis 07/29/2012    Past Surgical History:  Procedure Laterality Date  . CATARACT EXTRACTION     x 2  . COLONOSCOPY    . POLYPECTOMY      Prior to Admission medications   Medication Sig Start Date End Date Taking? Authorizing Provider  aspirin 81 MG tablet Take 81 mg by mouth daily.    Yes [provider]  famotidine (PEPCID) 20 MG tablet Take 1 tablet (20 mg total) by mouth at bedtime. 01/22/19   Biagio Borg, MD  gabapentin (NEURONTIN) 100 MG capsule Take 100 mg by mouth 2 (two) times daily. 12/13/18   [provider]  HYDROcodone-acetaminophen (NORCO/VICODIN) 5-325 MG tablet Take 1 tablet by mouth every 6 (six) hours as needed for moderate pain. 01/02/18   Biagio Borg, MD  latanoprost (XALATAN) 0.005 % ophthalmic solution INSTILL 1 DROP INTO BOTH EYES IN THE EVENING 01/09/19   [provider]  lisinopril (ZESTRIL) 10 MG tablet TAKE 1 TABLET BY MOUTH EVERY DAY 08/13/18   Biagio Borg, MD  metFORMIN (GLUCOPHAGE) 500 MG tablet Take 500 mg by mouth daily. 03/02/19   [provider]  ondansetron (ZOFRAN) 4 MG tablet Take 1 tablet (4 mg total) by mouth every 8 (eight) hours as needed  for nausea or vomiting. 03/05/19   Biagio Borg, MD  pantoprazole (PROTONIX) 40 MG tablet Take 1 tablet (40 mg total) by mouth 2 (two) times daily before a meal. 01/22/19   Biagio Borg, MD  polyethylene glycol powder (GLYCOLAX/MIRALAX) 17 GM/SCOOP powder Take 17 g by mouth 2 (two) times daily as needed. 12/05/18   Biagio Borg, MD  pravastatin (PRAVACHOL) 40 MG tablet TAKE 1 TABLET BY MOUTH EVERY DAY 06/18/18   Biagio Borg, MD  terazosin (HYTRIN) 2 MG capsule TAKE 1 CAPSULE BY MOUTH EVERY DAY 08/17/18   Biagio Borg, MD    Current Facility-Administered Medications  Medication  Dose Route Frequency Provider Last Rate Last Dose  . 0.9 %  sodium chloride infusion   Intravenous Continuous Lacretia Leigh, MD       Current Outpatient Medications  Medication Sig Dispense Refill  . aspirin 81 MG tablet Take 81 mg by mouth daily.     . famotidine (PEPCID) 20 MG tablet Take 1 tablet (20 mg total) by mouth at bedtime. 90 tablet 3  . gabapentin (NEURONTIN) 100 MG capsule Take 100 mg by mouth 2 (two) times daily.    Marland Kitchen HYDROcodone-acetaminophen (NORCO/VICODIN) 5-325 MG tablet Take 1 tablet by mouth every 6 (six) hours as needed for moderate pain. 30 tablet 0  . latanoprost (XALATAN) 0.005 % ophthalmic solution INSTILL 1 DROP INTO BOTH EYES IN THE EVENING    . lisinopril (ZESTRIL) 10 MG tablet TAKE 1 TABLET BY MOUTH EVERY DAY 90 tablet 2  . metFORMIN (GLUCOPHAGE) 500 MG tablet Take 500 mg by mouth daily.    . ondansetron (ZOFRAN) 4 MG tablet Take 1 tablet (4 mg total) by mouth every 8 (eight) hours as needed for nausea or vomiting. 30 tablet 1  . pantoprazole (PROTONIX) 40 MG tablet Take 1 tablet (40 mg total) by mouth 2 (two) times daily before a meal. 180 tablet 3  . polyethylene glycol powder (GLYCOLAX/MIRALAX) 17 GM/SCOOP powder Take 17 g by mouth 2 (two) times daily as needed. 3350 g 1  . pravastatin (PRAVACHOL) 40 MG tablet TAKE 1 TABLET BY MOUTH EVERY DAY 90 tablet 1  . terazosin (HYTRIN) 2 MG capsule TAKE 1 CAPSULE BY MOUTH EVERY DAY 90 capsule 3    Allergies as of 03/07/2019  . (No Known Allergies)    Family History  Problem Relation Age of Onset  . Cancer Mother   . Hypertension Mother   . Diabetes Sister   . Cancer Brother        colon  . Hypertension Brother   . Hyperlipidemia Brother   . Stroke Brother   . Hypertension Sister   . Colon cancer Neg Hx   . Heart attack Neg Hx   . Sudden death Neg Hx     Social History   Socioeconomic History  . Marital status: Married    Spouse name: Not on file  . Number of children: 7  . Years of education: 7  .  Highest education level: Not on file  Occupational History  . Occupation: Mining engineer: RETIRED  Social Needs  . Financial resource strain: Not hard at all  . Food insecurity    Worry: Never true    Inability: Never true  . Transportation needs    Medical: No    Non-medical: No  Tobacco Use  . Smoking status: Former Smoker    Quit date: 10/03/1978    Years since quitting: 40.4  .  Smokeless tobacco: Never Used  Substance and Sexual Activity  . Alcohol use: Yes    Alcohol/week: 5.0 standard drinks    Types: 5 Standard drinks or equivalent per week    Comment: pint, occasion beer  . Drug use: No  . Sexual activity: Yes    Partners: Female  Lifestyle  . Physical activity    Days per week: 0 days    Minutes per session: 0 min  . Stress: Not at all  Relationships  . Social connections    Talks on phone: More than three times a week    Gets together: More than three times a week    Attends religious service: More than 4 times per year    Active member of club or organization: Yes    Attends meetings of clubs or organizations: More than 4 times per year    Relationship status: Married  . Intimate partner violence    Fear of current or ex partner: No    Emotionally abused: No    Physically abused: No    Forced sexual activity: No  Other Topics Concern  . Not on file  Social History Narrative   9th grade.  Married '60. [name] 3 sons -'71 '64, '66; 4 dtrs -'60, '62, '61, '65; 15 grands, 25 great-grands.   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   ACD- discussed living will and HCPOA (July '14) provided packet.           Review of Systems: ROS is O/W negative except as mentioned in HPI.  Physical Exam: Vital signs in last 24 hours: Temp:  [97.9 F (36.6 C)] 97.9 F (36.6 C) (12/03 0933) Pulse Rate:  [78] 78 (12/03 0933) Resp:  [18] 18 (12/03 0933) BP: (109)/(66) 109/66 (12/03 0933) SpO2:  [100 %] 100 % (12/03 0933) Weight:  [79.4 kg] 79.4 kg (12/03  0934)   General:  Alert, Well-developed, well-nourished, pleasant and cooperative in NAD Head:  Normocephalic and atraumatic. Eyes:  Sclera clear, no icterus.  Conjunctiva pink. Ears:  Normal auditory acuity. Mouth:  No deformity or lesions.   Lungs:  Clear throughout to auscultation.  No wheezes, crackles, or rhonchi.  Heart:  Regular rate and rhythm; no murmurs, clicks, rubs, or gallops. Abdomen:  Soft, non-distended.  BS present.  Non-tender.    Msk:  Symmetrical without gross deformities. Pulses:  Normal pulses noted. Extremities:  Without clubbing or edema. Neurologic:  Alert and oriented x 4;  grossly normal neurologically. Skin:  Intact without significant lesions or rashes. Psych:  Alert and cooperative. Normal mood and affect.  Lab Results: Recent Labs    03/05/19 1542 03/07/19 1000  WBC 3.9* 5.4  HGB 13.5 13.6  HCT 41.2 42.7  PLT 195.0 188   BMET Recent Labs    03/05/19 1542 03/07/19 1000  NA 137 135  K 4.1 4.3  CL 103 98  CO2 28 27  GLUCOSE 100* 124*  BUN 17 18  CREATININE 1.13 1.11  CALCIUM 9.1 8.7*   LFT Recent Labs    03/05/19 1542 03/07/19 1000  PROT 6.9 7.1  ALBUMIN 4.0 4.0  AST 15 20  ALT 11 15  ALKPHOS 48 44  BILITOT 0.6 1.3*  BILIDIR 0.1  --    Studies/Results: Ct Abdomen Pelvis W Contrast  Result Date: 03/06/2019 CLINICAL DATA:  Epigastric pain with nausea and vomiting. Decreased appetite. EXAM: CT ABDOMEN AND PELVIS WITH CONTRAST TECHNIQUE: Multidetector CT imaging of the abdomen and pelvis was performed  using the standard protocol following bolus administration of intravenous contrast. CONTRAST:  154mL OMNIPAQUE IOHEXOL 300 MG/ML  SOLN COMPARISON:  05/07/2010 FINDINGS: Lower chest: Unremarkable. Hepatobiliary: 4 mm hypodensity in the dome of liver is too small to characterize but is probably benign. This may be a tiny cyst. There is no evidence for gallstones, gallbladder wall thickening, or pericholecystic fluid. No intrahepatic or  extrahepatic biliary dilation. Pancreas: Pancreas is diffusely atrophic without main duct dilatation. Spleen: Insert no splenomegaly Adrenals/Urinary Tract: No adrenal nodule or mass. 4 x 9 x 7 mm nonobstructing stone is identified interpolar right kidney. Kidneys otherwise unremarkable. No evidence for hydroureter. The urinary bladder appears normal for the degree of distention. Stomach/Bowel: The stomach is markedly distended with food, fluid, and contrast material. 2.0 x 1.7 cm nodular focus of soft tissue attenuation is identified in the region of the pylorus. This nodular soft tissue density is identifiable in all 3 planes. Duodenum is decompressed. Small bowel is largely decompressed without wall thickening. The terminal ileum is normal. The appendix is normal. No gross colonic mass. No colonic wall thickening. Vascular/Lymphatic: No abdominal aortic aneurysm. There is no gastrohepatic or hepatoduodenal ligament lymphadenopathy. No intraperitoneal or retroperitoneal lymphadenopathy. No pelvic sidewall lymphadenopathy. Reproductive: Prostate gland appears mildly enlarged. Other: No intraperitoneal free fluid. Musculoskeletal: Small left groin hernia contains only fat. Small umbilical hernia contains only fat. No worrisome lytic or sclerotic osseous abnormality. IMPRESSION: 1. Marked distention of the stomach which is filled with food, fluid, and contrast material. This appearance is compatible with outlet obstruction or severe dysmotility. There is a 2.0 x 1.7 cm nodular focus of soft tissue density in the region of the pylorus and while this area is difficult to assess by CT, features certainly raise concern for mass lesion with secondary gastric outlet obstruction. 2. No evidence for lymphadenopathy or metastatic disease in the abdomen/pelvis. 3. Nonobstructing right renal stone. These results will be called to the ordering clinician or representative by the Radiologist Assistant, and communication documented  in the PACS or zVision Dashboard. Electronically Signed   By: Misty Stanley M.D.   On: 03/06/2019 14:42   IMPRESSION:  *78 year old male with complaints of episodic nausea, vomiting, and upper abdominal discomfort with associated 20 pound weight loss over the past few months, but significantly worse over the past month.  CT scan showing gastric outlet obstruction with concern for mass lesion at the pylorus.  PLAN: -NG tube for decompression, which has been ordered. -We will plan for EGD with general anesthesia/intubation tomorrow morning. -Surgery is seeing the patient as well we appreciate their input.  Laban Emperor. Zhi Geier  03/07/2019, 11:28 AM

## 2019-03-07 NOTE — ED Provider Notes (Addendum)
[facility] DEPT Provider Note   CSN: [place]:[id number] Arrival date & time: 03/07/19  [id number]     History   Chief Complaint Chief Complaint  Patient presents with  . Gastroesophageal Reflux    HPI Jonathan Lewis is a 78 y.o. male.     78 year old male presents with recurrent emesis after he eats.  Has been diagnosed with GERD and has been on maximal therapy without relief.  Saw his doctor 2 days ago for similar symptoms and had blood work as well as he also had an outpatient CT which was performed.  That CT showed a gastric outlet obstruction from a possible mass.  He denies any fever or chills.  No hematemesis.  No prior history of same.  No treatment use prior to arrival.     Past Medical History:  Diagnosis Date  . Acoustic neuroma ([place]) 06/25/2015  . Allergic rhinitis 07/18/2014  . Arthritis   . BPH (benign prostatic hyperplasia)   . Carbuncle    recurrent MRSA carbuncles  . Cataract   . Diabetes mellitus    Type II  . Disk prolapse   . Glaucoma   . Hyperlipidemia   . Hypertension   . Male hypogonadism 07/16/2014  . Sinus bradycardia    chronic, asymptomatic  . Sinusitis 07/29/2012    Patient Active Problem List   Diagnosis Date Noted  . Weight loss 03/05/2019  . Nausea and vomiting 03/05/2019  . Decreased appetite 03/05/2019  . Epigastric pain 12/05/2018  . Hyperglycemia 12/05/2018  . Left lumbar radiculopathy 01/02/2018  . Leg pain, bilateral 11/08/2017  . Bursitis of right hip 05/10/2017  . Left shoulder pain 05/10/2017  . Physical deconditioning 05/10/2017  . Wheezing 12/13/2016  . Cough 11/03/2016  . Neck pain on left side 11/03/2016  . Bilateral leg paresthesia 11/10/2015  . Acoustic neuroma ([place]) 06/25/2015  . Hematochezia 05/13/2015  . Abnormal finding on MRI of brain 05/13/2015  . Vertigo 05/05/2015  . GERD (gastroesophageal reflux disease) 05/05/2015  . BPH (benign prostatic hyperplasia) 05/05/2015  . Bradycardia    . Chest pain 07/18/2014  . Allergic rhinitis 07/18/2014  . Pain of right thumb 07/18/2014  . Male hypogonadism 07/16/2014  . Preventative health care 07/17/2013  . Right sided sciatica 07/17/2013  . Constipation 01/21/2012  . Leukopenia 06/11/2011  . HEARING LOSS 04/10/2009  . GROIN PAIN 01/26/2009  . Osteoarthritis, hand 10/27/2008  . LOW BACK PAIN, CHRONIC 10/27/2008  . Diabetes ([place]) 07/17/2008  . Hyperlipidemia 06/06/2007  . EXTERNAL HEMORRHOIDS 02/15/2007  . BENIGN PROSTATIC HYPERTROPHY 02/15/2007  . ERECTILE DYSFUNCTION 10/31/2006  . Essential hypertension 10/31/2006    Past Surgical History:  Procedure Laterality Date  . CATARACT EXTRACTION     x 2  . COLONOSCOPY    . POLYPECTOMY          Home Medications    Prior to Admission medications   Medication Sig Start Date End Date Taking? Authorizing Provider  aspirin 81 MG tablet Take 81 mg by mouth daily.     [provider]  famotidine (PEPCID) 20 MG tablet Take 1 tablet (20 mg total) by mouth at bedtime. 01/22/19   Biagio Borg, MD  HYDROcodone-acetaminophen (NORCO/VICODIN) 5-325 MG tablet Take 1 tablet by mouth every 6 (six) hours as needed for moderate pain. 01/02/18   Biagio Borg, MD  latanoprost (XALATAN) 0.005 % ophthalmic solution INSTILL 1 DROP INTO BOTH EYES IN THE EVENING 01/09/19   [provider]  lisinopril (  ZESTRIL) 10 MG tablet TAKE 1 TABLET BY MOUTH EVERY DAY 08/13/18   Biagio Borg, MD  ondansetron (ZOFRAN) 4 MG tablet Take 1 tablet (4 mg total) by mouth every 8 (eight) hours as needed for nausea or vomiting. 03/05/19   Biagio Borg, MD  pantoprazole (PROTONIX) 40 MG tablet Take 1 tablet (40 mg total) by mouth 2 (two) times daily before a meal. 01/22/19   Biagio Borg, MD  polyethylene glycol powder (GLYCOLAX/MIRALAX) 17 GM/SCOOP powder Take 17 g by mouth 2 (two) times daily as needed. 12/05/18   Biagio Borg, MD  pravastatin (PRAVACHOL) 40 MG tablet TAKE 1 TABLET BY MOUTH EVERY DAY  06/18/18   Biagio Borg, MD  terazosin (HYTRIN) 2 MG capsule TAKE 1 CAPSULE BY MOUTH EVERY DAY 08/17/18   Biagio Borg, MD    Family History Family History  Problem Relation Age of Onset  . Cancer Mother   . Hypertension Mother   . Diabetes Sister   . Cancer Brother        colon  . Hypertension Brother   . Hyperlipidemia Brother   . Stroke Brother   . Hypertension Sister   . Colon cancer Neg Hx   . Heart attack Neg Hx   . Sudden death Neg Hx     Social History Social History   Tobacco Use  . Smoking status: Former Smoker    Quit date: 10/03/1978    Years since quitting: 40.4  . Smokeless tobacco: Never Used  Substance Use Topics  . Alcohol use: Yes    Alcohol/week: 5.0 standard drinks    Types: 5 Standard drinks or equivalent per week    Comment: pint, occasion beer  . Drug use: No     Allergies   Patient has no known allergies.   Review of Systems Review of Systems  All other systems reviewed and are negative.    Physical Exam Updated Vital Signs BP 109/66 (BP Location: Left Arm)   Pulse 78   Temp 97.9 F (36.6 C) (Oral)   Resp 18   Ht 1.727 m (5\' 8" )   Wt 79.4 kg   SpO2 100%   BMI 26.61 kg/m   Physical Exam Vitals signs and nursing note reviewed.  Constitutional:      General: He is not in acute distress.    Appearance: Normal appearance. He is well-developed. He is not toxic-appearing.  HENT:     Head: Normocephalic and atraumatic.  Eyes:     General: Lids are normal.     Conjunctiva/sclera: Conjunctivae normal.     Pupils: Pupils are equal, round, and reactive to light.  Neck:     Musculoskeletal: Normal range of motion and neck supple.     Thyroid: No thyroid mass.     Trachea: No tracheal deviation.  Cardiovascular:     Rate and Rhythm: Normal rate and regular rhythm.     Heart sounds: Normal heart sounds. No murmur. No gallop.   Pulmonary:     Effort: Pulmonary effort is normal. No respiratory distress.     Breath sounds: Normal  breath sounds. No stridor. No decreased breath sounds, wheezing, rhonchi or rales.  Abdominal:     General: Bowel sounds are normal. There is no distension.     Palpations: Abdomen is soft.     Tenderness: There is no abdominal tenderness. There is no rebound.  Musculoskeletal: Normal range of motion.        General: No  tenderness.  Skin:    General: Skin is warm and dry.     Findings: No abrasion or rash.  Neurological:     Mental Status: He is alert and oriented to person, place, and time.     GCS: GCS eye subscore is 4. GCS verbal subscore is 5. GCS motor subscore is 6.     Cranial Nerves: No cranial nerve deficit.     Sensory: No sensory deficit.  Psychiatric:        Speech: Speech normal.        Behavior: Behavior normal.      ED Treatments / Results  Labs (all labs ordered are listed, but only abnormal results are displayed) Labs Reviewed - No data to display  EKG None  Radiology Ct Abdomen Pelvis W Contrast  Result Date: 03/06/2019 CLINICAL DATA:  Epigastric pain with nausea and vomiting. Decreased appetite. EXAM: CT ABDOMEN AND PELVIS WITH CONTRAST TECHNIQUE: Multidetector CT imaging of the abdomen and pelvis was performed using the standard protocol following bolus administration of intravenous contrast. CONTRAST:  176mL OMNIPAQUE IOHEXOL 300 MG/ML  SOLN COMPARISON:  05/07/2010 FINDINGS: Lower chest: Unremarkable. Hepatobiliary: 4 mm hypodensity in the dome of liver is too small to characterize but is probably benign. This may be a tiny cyst. There is no evidence for gallstones, gallbladder wall thickening, or pericholecystic fluid. No intrahepatic or extrahepatic biliary dilation. Pancreas: Pancreas is diffusely atrophic without main duct dilatation. Spleen: Insert no splenomegaly Adrenals/Urinary Tract: No adrenal nodule or mass. 4 x 9 x 7 mm nonobstructing stone is identified interpolar right kidney. Kidneys otherwise unremarkable. No evidence for hydroureter. The urinary  bladder appears normal for the degree of distention. Stomach/Bowel: The stomach is markedly distended with food, fluid, and contrast material. 2.0 x 1.7 cm nodular focus of soft tissue attenuation is identified in the region of the pylorus. This nodular soft tissue density is identifiable in all 3 planes. Duodenum is decompressed. Small bowel is largely decompressed without wall thickening. The terminal ileum is normal. The appendix is normal. No gross colonic mass. No colonic wall thickening. Vascular/Lymphatic: No abdominal aortic aneurysm. There is no gastrohepatic or hepatoduodenal ligament lymphadenopathy. No intraperitoneal or retroperitoneal lymphadenopathy. No pelvic sidewall lymphadenopathy. Reproductive: Prostate gland appears mildly enlarged. Other: No intraperitoneal free fluid. Musculoskeletal: Small left groin hernia contains only fat. Small umbilical hernia contains only fat. No worrisome lytic or sclerotic osseous abnormality. IMPRESSION: 1. Marked distention of the stomach which is filled with food, fluid, and contrast material. This appearance is compatible with outlet obstruction or severe dysmotility. There is a 2.0 x 1.7 cm nodular focus of soft tissue density in the region of the pylorus and while this area is difficult to assess by CT, features certainly raise concern for mass lesion with secondary gastric outlet obstruction. 2. No evidence for lymphadenopathy or metastatic disease in the abdomen/pelvis. 3. Nonobstructing right renal stone. These results will be called to the ordering clinician or representative by the Radiologist Assistant, and communication documented in the PACS or zVision Dashboard. Electronically Signed   By: Misty Stanley M.D.   On: 03/06/2019 14:42    Procedures Procedures (including critical care time)  Medications Ordered in ED Medications - No data to display   Initial Impression / Assessment and Plan / ED Course  I have reviewed the triage vital signs and  the nursing notes.  Pertinent labs & imaging results that were available during my care of the patient were reviewed by me and considered in  my medical decision making (see chart for details).        Patient evidence of gastric outlet obstruction on the CT that was done yesterday.  Will consult general surgery\  10:46 AM Spoke with general surgery who recommends that patient have a GI consult and admission to the medicine service  Final Clinical Impressions(s) / ED Diagnoses   Final diagnoses:  None    ED Discharge Orders    None       Lacretia Leigh, MD 03/07/19 1001    Lacretia Leigh, MD 03/07/19 1047

## 2019-03-07 NOTE — ED Triage Notes (Signed)
Pt presents with c/o burping, a burning pain with the burping, and reports of some emesis, not able to keep any food down. Pt reports these symptoms have been present for approx one month, reports he has been seen for same and the medicine he was given is not working.

## 2019-03-07 NOTE — Consult Note (Signed)
Referring Provider:  Dr. Zenia Resides, [place] Primary Care Physician:  Biagio Borg, MD Primary Gastroenterologist:  Dr. Havery Moros  Reason for Consultation:  GOO/?gastric mass  HPI: Jonathan Lewis is a 78 y.o. male with past medical history of hypertension, hyperlipidemia, type 2 diabetes mellitus who presented to [facility] with complaints of nausea, vomiting, and upper abdominal discomfort.  CT scan of the abdomen/pelvis with contrast showed the following:  IMPRESSION: 1. Marked distention of the stomach which is filled with food, fluid, and contrast material. This appearance is compatible with outlet obstruction or severe dysmotility. There is a 2.0 x 1.7 cm nodular focus of soft tissue density in the region of the pylorus and while this area is difficult to assess by CT, features certainly raise concern for mass lesion with secondary gastric outlet obstruction. 2. No evidence for lymphadenopathy or metastatic disease in the abdomen/pelvis. 3. Nonobstructing right renal stone.  Surgery has seen the patient as well.  Order for NG tube has been placed, but the NG tube itself is not in yet.  He tells me that he has just been feeling not right for the past few months.  Over the past month though his symptoms have significantly worsened.  Has episodic nausea and vomiting.  No significant pain, just a fullness sensation and then burning up into his chest.  A lot of belching.  Has lost about 20 pounds recently.  PCP had been treating him with different medications such as pantoprazole and pepcid, but they were not helping so he finally had an outpatient CT scan yesterday.  Due to the results he was sent to the emergency department.  Labs all look good.  He denies any NSAID use.   Past Medical History:  Diagnosis Date  . Acoustic neuroma ([place]) 06/25/2015  . Allergic rhinitis 07/18/2014  . Arthritis   . BPH (benign prostatic hyperplasia)   . Carbuncle    recurrent MRSA carbuncles  .  Cataract   . Diabetes mellitus    Type II  . Disk prolapse   . Glaucoma   . Hyperlipidemia   . Hypertension   . Male hypogonadism 07/16/2014  . Sinus bradycardia    chronic, asymptomatic  . Sinusitis 07/29/2012    Past Surgical History:  Procedure Laterality Date  . CATARACT EXTRACTION     x 2  . COLONOSCOPY    . POLYPECTOMY      Prior to Admission medications   Medication Sig Start Date End Date Taking? Authorizing Provider  aspirin 81 MG tablet Take 81 mg by mouth daily.    Yes [provider]  famotidine (PEPCID) 20 MG tablet Take 1 tablet (20 mg total) by mouth at bedtime. 01/22/19   Biagio Borg, MD  gabapentin (NEURONTIN) 100 MG capsule Take 100 mg by mouth 2 (two) times daily. 12/13/18   [provider]  HYDROcodone-acetaminophen (NORCO/VICODIN) 5-325 MG tablet Take 1 tablet by mouth every 6 (six) hours as needed for moderate pain. 01/02/18   Biagio Borg, MD  latanoprost (XALATAN) 0.005 % ophthalmic solution INSTILL 1 DROP INTO BOTH EYES IN THE EVENING 01/09/19   [provider]  lisinopril (ZESTRIL) 10 MG tablet TAKE 1 TABLET BY MOUTH EVERY DAY 08/13/18   Biagio Borg, MD  metFORMIN (GLUCOPHAGE) 500 MG tablet Take 500 mg by mouth daily. 03/02/19   [provider]  ondansetron (ZOFRAN) 4 MG tablet Take 1 tablet (4 mg total) by mouth every 8 (eight) hours as needed  for nausea or vomiting. 03/05/19   Biagio Borg, MD  pantoprazole (PROTONIX) 40 MG tablet Take 1 tablet (40 mg total) by mouth 2 (two) times daily before a meal. 01/22/19   Biagio Borg, MD  polyethylene glycol powder (GLYCOLAX/MIRALAX) 17 GM/SCOOP powder Take 17 g by mouth 2 (two) times daily as needed. 12/05/18   Biagio Borg, MD  pravastatin (PRAVACHOL) 40 MG tablet TAKE 1 TABLET BY MOUTH EVERY DAY 06/18/18   Biagio Borg, MD  terazosin (HYTRIN) 2 MG capsule TAKE 1 CAPSULE BY MOUTH EVERY DAY 08/17/18   Biagio Borg, MD    Current Facility-Administered Medications  Medication  Dose Route Frequency Provider Last Rate Last Dose  . 0.9 %  sodium chloride infusion   Intravenous Continuous Lacretia Leigh, MD       Current Outpatient Medications  Medication Sig Dispense Refill  . aspirin 81 MG tablet Take 81 mg by mouth daily.     . famotidine (PEPCID) 20 MG tablet Take 1 tablet (20 mg total) by mouth at bedtime. 90 tablet 3  . gabapentin (NEURONTIN) 100 MG capsule Take 100 mg by mouth 2 (two) times daily.    Marland Kitchen HYDROcodone-acetaminophen (NORCO/VICODIN) 5-325 MG tablet Take 1 tablet by mouth every 6 (six) hours as needed for moderate pain. 30 tablet 0  . latanoprost (XALATAN) 0.005 % ophthalmic solution INSTILL 1 DROP INTO BOTH EYES IN THE EVENING    . lisinopril (ZESTRIL) 10 MG tablet TAKE 1 TABLET BY MOUTH EVERY DAY 90 tablet 2  . metFORMIN (GLUCOPHAGE) 500 MG tablet Take 500 mg by mouth daily.    . ondansetron (ZOFRAN) 4 MG tablet Take 1 tablet (4 mg total) by mouth every 8 (eight) hours as needed for nausea or vomiting. 30 tablet 1  . pantoprazole (PROTONIX) 40 MG tablet Take 1 tablet (40 mg total) by mouth 2 (two) times daily before a meal. 180 tablet 3  . polyethylene glycol powder (GLYCOLAX/MIRALAX) 17 GM/SCOOP powder Take 17 g by mouth 2 (two) times daily as needed. 3350 g 1  . pravastatin (PRAVACHOL) 40 MG tablet TAKE 1 TABLET BY MOUTH EVERY DAY 90 tablet 1  . terazosin (HYTRIN) 2 MG capsule TAKE 1 CAPSULE BY MOUTH EVERY DAY 90 capsule 3    Allergies as of 03/07/2019  . (No Known Allergies)    Family History  Problem Relation Age of Onset  . Cancer Mother   . Hypertension Mother   . Diabetes Sister   . Cancer Brother        colon  . Hypertension Brother   . Hyperlipidemia Brother   . Stroke Brother   . Hypertension Sister   . Colon cancer Neg Hx   . Heart attack Neg Hx   . Sudden death Neg Hx     Social History   Socioeconomic History  . Marital status: Married    Spouse name: Not on file  . Number of children: 7  . Years of education: 7  .  Highest education level: Not on file  Occupational History  . Occupation: Mining engineer: RETIRED  Social Needs  . Financial resource strain: Not hard at all  . Food insecurity    Worry: Never true    Inability: Never true  . Transportation needs    Medical: No    Non-medical: No  Tobacco Use  . Smoking status: Former Smoker    Quit date: 10/03/1978    Years since quitting: 40.4  .  Smokeless tobacco: Never Used  Substance and Sexual Activity  . Alcohol use: Yes    Alcohol/week: 5.0 standard drinks    Types: 5 Standard drinks or equivalent per week    Comment: pint, occasion beer  . Drug use: No  . Sexual activity: Yes    Partners: Female  Lifestyle  . Physical activity    Days per week: 0 days    Minutes per session: 0 min  . Stress: Not at all  Relationships  . Social connections    Talks on phone: More than three times a week    Gets together: More than three times a week    Attends religious service: More than 4 times per year    Active member of club or organization: Yes    Attends meetings of clubs or organizations: More than 4 times per year    Relationship status: Married  . Intimate partner violence    Fear of current or ex partner: No    Emotionally abused: No    Physically abused: No    Forced sexual activity: No  Other Topics Concern  . Not on file  Social History Narrative   9th grade.  Married '60. [name] 3 sons -'44 '64, '66; 4 dtrs -'60, '62, '61, '65; 15 grands, 25 great-grands.   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   ACD- discussed living will and HCPOA (July '14) provided packet.           Review of Systems: ROS is O/W negative except as mentioned in HPI.  Physical Exam: Vital signs in last 24 hours: Temp:  [97.9 F (36.6 C)] 97.9 F (36.6 C) (12/03 0933) Pulse Rate:  [78] 78 (12/03 0933) Resp:  [18] 18 (12/03 0933) BP: (109)/(66) 109/66 (12/03 0933) SpO2:  [100 %] 100 % (12/03 0933) Weight:  [79.4 kg] 79.4 kg (12/03  0934)   General:  Alert, Well-developed, well-nourished, pleasant and cooperative in NAD Head:  Normocephalic and atraumatic. Eyes:  Sclera clear, no icterus.  Conjunctiva pink. Ears:  Normal auditory acuity. Mouth:  No deformity or lesions.   Lungs:  Clear throughout to auscultation.  No wheezes, crackles, or rhonchi.  Heart:  Regular rate and rhythm; no murmurs, clicks, rubs, or gallops. Abdomen:  Soft, non-distended.  BS present.  Non-tender.    Msk:  Symmetrical without gross deformities. Pulses:  Normal pulses noted. Extremities:  Without clubbing or edema. Neurologic:  Alert and oriented x 4;  grossly normal neurologically. Skin:  Intact without significant lesions or rashes. Psych:  Alert and cooperative. Normal mood and affect.  Lab Results: Recent Labs    03/05/19 1542 03/07/19 1000  WBC 3.9* 5.4  HGB 13.5 13.6  HCT 41.2 42.7  PLT 195.0 188   BMET Recent Labs    03/05/19 1542 03/07/19 1000  NA 137 135  K 4.1 4.3  CL 103 98  CO2 28 27  GLUCOSE 100* 124*  BUN 17 18  CREATININE 1.13 1.11  CALCIUM 9.1 8.7*   LFT Recent Labs    03/05/19 1542 03/07/19 1000  PROT 6.9 7.1  ALBUMIN 4.0 4.0  AST 15 20  ALT 11 15  ALKPHOS 48 44  BILITOT 0.6 1.3*  BILIDIR 0.1  --    Studies/Results: Ct Abdomen Pelvis W Contrast  Result Date: 03/06/2019 CLINICAL DATA:  Epigastric pain with nausea and vomiting. Decreased appetite. EXAM: CT ABDOMEN AND PELVIS WITH CONTRAST TECHNIQUE: Multidetector CT imaging of the abdomen and pelvis was performed  using the standard protocol following bolus administration of intravenous contrast. CONTRAST:  175mL OMNIPAQUE IOHEXOL 300 MG/ML  SOLN COMPARISON:  05/07/2010 FINDINGS: Lower chest: Unremarkable. Hepatobiliary: 4 mm hypodensity in the dome of liver is too small to characterize but is probably benign. This may be a tiny cyst. There is no evidence for gallstones, gallbladder wall thickening, or pericholecystic fluid. No intrahepatic or  extrahepatic biliary dilation. Pancreas: Pancreas is diffusely atrophic without main duct dilatation. Spleen: Insert no splenomegaly Adrenals/Urinary Tract: No adrenal nodule or mass. 4 x 9 x 7 mm nonobstructing stone is identified interpolar right kidney. Kidneys otherwise unremarkable. No evidence for hydroureter. The urinary bladder appears normal for the degree of distention. Stomach/Bowel: The stomach is markedly distended with food, fluid, and contrast material. 2.0 x 1.7 cm nodular focus of soft tissue attenuation is identified in the region of the pylorus. This nodular soft tissue density is identifiable in all 3 planes. Duodenum is decompressed. Small bowel is largely decompressed without wall thickening. The terminal ileum is normal. The appendix is normal. No gross colonic mass. No colonic wall thickening. Vascular/Lymphatic: No abdominal aortic aneurysm. There is no gastrohepatic or hepatoduodenal ligament lymphadenopathy. No intraperitoneal or retroperitoneal lymphadenopathy. No pelvic sidewall lymphadenopathy. Reproductive: Prostate gland appears mildly enlarged. Other: No intraperitoneal free fluid. Musculoskeletal: Small left groin hernia contains only fat. Small umbilical hernia contains only fat. No worrisome lytic or sclerotic osseous abnormality. IMPRESSION: 1. Marked distention of the stomach which is filled with food, fluid, and contrast material. This appearance is compatible with outlet obstruction or severe dysmotility. There is a 2.0 x 1.7 cm nodular focus of soft tissue density in the region of the pylorus and while this area is difficult to assess by CT, features certainly raise concern for mass lesion with secondary gastric outlet obstruction. 2. No evidence for lymphadenopathy or metastatic disease in the abdomen/pelvis. 3. Nonobstructing right renal stone. These results will be called to the ordering clinician or representative by the Radiologist Assistant, and communication documented  in the PACS or zVision Dashboard. Electronically Signed   By: Misty Stanley M.D.   On: 03/06/2019 14:42   IMPRESSION:  *78 year old male with complaints of episodic nausea, vomiting, and upper abdominal discomfort with associated 20 pound weight loss over the past few months, but significantly worse over the past month.  CT scan showing gastric outlet obstruction with concern for mass lesion at the pylorus.  PLAN: -NG tube for decompression, which has been ordered. -We will plan for EGD with general anesthesia/intubation tomorrow morning. -Surgery is seeing the patient as well we appreciate their input.  Laban Emperor. Pollyanna Levay  03/07/2019, 11:28 AM

## 2019-03-07 NOTE — Progress Notes (Signed)
Received report from ED at 1351

## 2019-03-07 NOTE — ED Notes (Signed)
Pts clothes and cell phone will be transported with pt to inpatient room.  Wife will take pts wallet home.

## 2019-03-08 ENCOUNTER — Encounter (HOSPITAL_COMMUNITY): Admission: EM | Disposition: A | Discharge status: Home Health | Payer: Self-pay | Source: Home / Self Care

## 2019-03-08 ENCOUNTER — Inpatient Hospital Stay (HOSPITAL_COMMUNITY): Payer: Medicare Other | Admitting: Anesthesiology

## 2019-03-08 ENCOUNTER — Encounter (HOSPITAL_COMMUNITY): Payer: Self-pay

## 2019-03-08 DIAGNOSIS — T182XXA Foreign body in stomach, initial encounter: Secondary | ICD-10-CM

## 2019-03-08 DIAGNOSIS — K2100 Gastro-esophageal reflux disease with esophagitis, without bleeding: Secondary | ICD-10-CM

## 2019-03-08 DIAGNOSIS — E1159 Type 2 diabetes mellitus with other circulatory complications: Secondary | ICD-10-CM

## 2019-03-08 DIAGNOSIS — E440 Moderate protein-calorie malnutrition: Secondary | ICD-10-CM | POA: Insufficient documentation

## 2019-03-08 HISTORY — PX: BIOPSY: SHX5522

## 2019-03-08 HISTORY — PX: FOREIGN BODY REMOVAL: SHX962

## 2019-03-08 HISTORY — PX: ESOPHAGOGASTRODUODENOSCOPY: SHX5428

## 2019-03-08 LAB — COMPREHENSIVE METABOLIC PANEL
ALT: 13 U/L (ref 0–44)
AST: 14 U/L — ABNORMAL LOW (ref 15–41)
Albumin: 3.4 g/dL — ABNORMAL LOW (ref 3.5–5.0)
Alkaline Phosphatase: 40 U/L (ref 38–126)
Anion gap: 11 (ref 5–15)
BUN: 18 mg/dL (ref 8–23)
CO2: 27 mmol/L (ref 22–32)
Calcium: 8.7 mg/dL — ABNORMAL LOW (ref 8.9–10.3)
Chloride: 102 mmol/L (ref 98–111)
Creatinine, Ser: 1.04 mg/dL (ref 0.61–1.24)
GFR calc Af Amer: >60 mL/min (ref >60)
GFR calc non Af Amer: >60 mL/min (ref >60)
Glucose, Bld: 101 mg/dL — ABNORMAL HIGH (ref 70–99)
Potassium: 3.9 mmol/L (ref 3.5–5.1)
Sodium: 140 mmol/L (ref 135–145)
Total Bilirubin: 0.9 mg/dL (ref 0.3–1.2)
Total Protein: 6.2 g/dL — ABNORMAL LOW (ref 6.5–8.1)

## 2019-03-08 LAB — CBC
HCT: 38.3 % — ABNORMAL LOW (ref 39.0–52.0)
Hemoglobin: 12.4 g/dL — ABNORMAL LOW (ref 13.0–17.0)
MCH: 28.3 pg (ref 26.0–34.0)
MCHC: 32.4 g/dL (ref 30.0–36.0)
MCV: 87.4 fL (ref 80.0–100.0)
Platelets: 191 10*3/uL (ref 150–400)
RBC: 4.38 MIL/uL (ref 4.22–5.81)
RDW: 12.3 % (ref 11.5–15.5)
WBC: 4.4 10*3/uL (ref 4.0–10.5)
nRBC: 0 % (ref 0.0–0.2)

## 2019-03-08 LAB — GLUCOSE, CAPILLARY
Glucose-Capillary: 97 mg/dL (ref 70–99)
Glucose-Capillary: 111 mg/dL — ABNORMAL HIGH (ref 70–99)
Glucose-Capillary: 126 mg/dL — ABNORMAL HIGH (ref 70–99)

## 2019-03-08 SURGERY — EGD (ESOPHAGOGASTRODUODENOSCOPY)
Anesthesia: General

## 2019-03-08 MED ORDER — PHENYLEPHRINE 40 MCG/ML (10ML) SYRINGE FOR IV PUSH (FOR BLOOD PRESSURE SUPPORT)
PREFILLED_SYRINGE | INTRAVENOUS | Status: DC | PRN
  Administered 2019-03-08: 80 ug via INTRAVENOUS
  Administered 2019-03-08: 120 ug via INTRAVENOUS

## 2019-03-08 MED ORDER — ADULT MULTIVITAMIN LIQUID CH
15.0000 mL | Freq: Every day | ORAL | Status: DC
  Administered 2019-03-08 – 2019-03-14 (×6): 15 mL
  Filled 2019-03-08 (×8): qty 15

## 2019-03-08 MED ORDER — EPHEDRINE SULFATE-NACL 50-0.9 MG/10ML-% IV SOSY
PREFILLED_SYRINGE | INTRAVENOUS | Status: DC | PRN
  Administered 2019-03-08 (×3): 10 mg via INTRAVENOUS

## 2019-03-08 MED ORDER — LACTATED RINGERS IV SOLN
INTRAVENOUS | Status: DC
  Administered 2019-03-08 (×2): via INTRAVENOUS

## 2019-03-08 MED ORDER — PRO-STAT SUGAR FREE PO LIQD: 30.0000 mL | Freq: Two times a day (BID) | ORAL | Status: DC

## 2019-03-08 MED ORDER — LIDOCAINE 2% (20 MG/ML) 5 ML SYRINGE
INTRAMUSCULAR | Status: DC | PRN
  Administered 2019-03-08: 100 mg via INTRAVENOUS

## 2019-03-08 MED ORDER — SUCCINYLCHOLINE CHLORIDE 200 MG/10ML IV SOSY
PREFILLED_SYRINGE | INTRAVENOUS | Status: DC | PRN
  Administered 2019-03-08: 100 mg via INTRAVENOUS

## 2019-03-08 MED ORDER — PRO-STAT SUGAR FREE PO LIQD
30.0000 mL | Freq: Three times a day (TID) | ORAL | Status: DC
  Administered 2019-03-08 – 2019-03-14 (×13): 30 mL via ORAL
  Filled 2019-03-08 (×13): qty 30

## 2019-03-08 MED ORDER — BOOST / RESOURCE BREEZE PO LIQD CUSTOM
1.0000 | Freq: Three times a day (TID) | ORAL | Status: DC
  Administered 2019-03-08 – 2019-03-14 (×17): 1 via ORAL

## 2019-03-08 MED ORDER — PROPOFOL 10 MG/ML IV BOLUS
INTRAVENOUS | Status: DC | PRN
  Administered 2019-03-08: 130 mg via INTRAVENOUS
  Administered 2019-03-08: 20 mg via INTRAVENOUS

## 2019-03-08 MED ORDER — PROPOFOL 10 MG/ML IV BOLUS
INTRAVENOUS | Status: AC
  Filled 2019-03-08: qty 20

## 2019-03-08 MED ORDER — DEXAMETHASONE SODIUM PHOSPHATE 10 MG/ML IJ SOLN
INTRAMUSCULAR | Status: DC | PRN
  Administered 2019-03-08: 10 mg via INTRAVENOUS

## 2019-03-08 MED ORDER — FENTANYL CITRATE (PF) 100 MCG/2ML IJ SOLN
INTRAMUSCULAR | Status: AC
  Filled 2019-03-08: qty 2

## 2019-03-08 MED ORDER — SODIUM CHLORIDE 0.9 % IV SOLN: INTRAVENOUS | Status: DC

## 2019-03-08 MED ORDER — FENTANYL CITRATE (PF) 100 MCG/2ML IJ SOLN
INTRAMUSCULAR | Status: DC | PRN
  Administered 2019-03-08: 50 ug via INTRAVENOUS

## 2019-03-08 MED ORDER — ONDANSETRON HCL 4 MG/2ML IJ SOLN
INTRAMUSCULAR | Status: DC | PRN
  Administered 2019-03-08: 4 mg via INTRAVENOUS

## 2019-03-08 NOTE — Transfer of Care (Signed)
Immediate Anesthesia Transfer of Care Note  Patient: Jonathan Lewis  Procedure(s) Performed: ESOPHAGOGASTRODUODENOSCOPY (EGD) (N/A ) BIOPSY  Patient Location: PACU  Anesthesia Type:General  Level of Consciousness: awake, alert  and oriented  Airway & Oxygen Therapy: Patient Spontanous Breathing and Patient connected to face mask oxygen  Post-op Assessment: Report given to RN and Post -op Vital signs reviewed and stable  Post vital signs: Reviewed and stable  Last Vitals:  Vitals Value Taken Time  BP 130/74 03/08/19 1005  Temp 36.9 C 03/08/19 1005  Pulse 77 03/08/19 1009  Resp 13 03/08/19 1009  SpO2 100 % 03/08/19 1009  Vitals shown include unvalidated device data.  Last Pain:  Vitals:   03/08/19 1005  TempSrc: Oral  PainSc: 0-No pain         Complications: No apparent anesthesia complications

## 2019-03-08 NOTE — Interval H&P Note (Signed)
History and Physical Interval Note:  03/08/2019 8:08 AM  Jonathan Lewis  has presented today for surgery, with the diagnosis of Gastric outlet obstruction.  The various methods of treatment have been discussed with the patient and family. After consideration of risks, benefits and other options for treatment, the patient has consented to  Procedure(s): ESOPHAGOGASTRODUODENOSCOPY (EGD) (N/A) as a surgical intervention.  The patient's history has been reviewed, patient examined, no change in status, stable for surgery.  I have reviewed the patient's chart and labs.  Questions were answered to the patient's satisfaction.     [place]

## 2019-03-08 NOTE — Progress Notes (Signed)
Initial Nutrition Assessment  DOCUMENTATION CODES:   Non-severe (moderate) malnutrition in context of acute illness/injury  INTERVENTION:  - continue Boost Breeze TID, each supplement provides 250 kcal and 9 grams of protein. - will increase 30 mL Prostat from BID to TID, each supplement provides 100 kcal and 15 grams of protein. - will order 15 ml liquid multivitamin/day. - will monitor medical course as it relates to nutrition/nutrition needs.    NUTRITION DIAGNOSIS:   Moderate Malnutrition related to acute illness as evidenced by percent weight loss, moderate muscle depletion, mild fat depletion.  GOAL:   Patient will meet greater than or equal to 90% of their needs  MONITOR:   PO intake, Supplement acceptance, Diet advancement, Labs, Weight trends  REASON FOR ASSESSMENT:   Malnutrition Screening Tool  ASSESSMENT:   78 y.o. male with medical history significant of GERD, HTN, hyperglycemia not on any diabetic medication, acoustic neuroma, and BPH. Patient was admitted for persistent N/V and burping x1 month. He reports decreased appetite and 20 lb weight loss in that time frame. CT abdomen showed GOO from a possible mass.  Diet advanced from NPO to CLD today at 1109 with no intakes documented since that time. Patient reports he recently finished lunch and consumed all of broth and had a little bit of other items on the tray. He denies any over abdominal pain/pressure or feelings of nausea currently. He reports that for the past month he has had intermittent N/V and feeling like items were not "moving through" for the past 1 month. These sensations are worse when he lays down and worse with some items than others. He noticed that he did not have much issue after eating salads, but states he felt very bad after last meal which was on 12/2 when he ate chicken and fries from [place].   Patient reports the highest weight prior to this issue starting was 190 lb and he thinks that he  weighed that in October. He states that when he went to his PCP x1.5 weeks ago he weighed 175 lb. Current weight recorded as 166 lb and weight on 01/22/19 as 184 lb. This indicates 18 lb weight loss (9.8% body weight) in the past 1.5 months; significant for time frame.   Per notes: - upper EGD done today which showed gastric stenosis at the pylorus with severe narrowing and nodule extending into the duodenum (biopsy taken) - plan is to wait for biopsy results before determining any plan concerning surgery   Labs reviewed; Ca: 8.7 mg/dl.  Medications reviewed; sliding scale novolog, 40 mg IV protonix BID.  IVF; NS @ 100 ml/hr.    NUTRITION - FOCUSED PHYSICAL EXAM:    Most Recent Value  Orbital Region  No depletion  Upper Arm Region  Mild depletion  Thoracic and Lumbar Region  Unable to assess  Buccal Region  Mild depletion  Temple Region  Mild depletion  Clavicle Bone Region  Moderate depletion  Clavicle and Acromion Bone Region  Moderate depletion  Scapular Bone Region  Moderate depletion  Dorsal Hand  Moderate depletion  Patellar Region  Moderate depletion  Anterior Thigh Region  Unable to assess  Posterior Calf Region  Moderate depletion  Edema (RD Assessment)  None  Hair  Reviewed  Eyes  Reviewed  Mouth  Reviewed  Skin  Reviewed  Nails  Reviewed       Diet Order:   Diet Order            Diet clear liquid  Room service appropriate? Yes; Fluid consistency: Thin  Diet effective now              EDUCATION NEEDS:   Not appropriate for education at this time  Skin:  Skin Assessment: Reviewed RN Assessment  Last BM:  12/2  Height:   Ht Readings from Last 1 Encounters:  03/07/19 5\' 8"  (1.727 m)    Weight:   Wt Readings from Last 1 Encounters:  03/07/19 75.3 kg    Ideal Body Weight:  70 kg  BMI:  Body mass index is 25.24 kg/m.  Estimated Nutritional Needs:   Kcal:  1880-2110 kcal  Protein:  90-100 grams  Fluid:  >/= 2 L/day     Jonathan Matin, MS, RD, LDN, [facility] Inpatient Clinical Dietitian Pager # [phone] After hours/weekend pager # [phone]

## 2019-03-08 NOTE — Anesthesia Postprocedure Evaluation (Signed)
Anesthesia Post Note  Patient: Jonathan Lewis  Procedure(s) Performed: ESOPHAGOGASTRODUODENOSCOPY (EGD) (N/A ) BIOPSY     Patient location during evaluation: PACU Anesthesia Type: General Level of consciousness: awake and alert Pain management: pain level controlled Vital Signs Assessment: post-procedure vital signs reviewed and stable Respiratory status: spontaneous breathing, nonlabored ventilation and respiratory function stable Cardiovascular status: blood pressure returned to baseline and stable Postop Assessment: no apparent nausea or vomiting Anesthetic complications: no    Last Vitals:  Vitals:   03/08/19 1026 03/08/19 1348  BP: 119/80 126/89  Pulse: 77 68  Resp: 13 16  Temp:  36.4 C  SpO2: 99% 100%    Last Pain:  Vitals:   03/08/19 1348  TempSrc: Oral  PainSc:                  Catalina Gravel

## 2019-03-08 NOTE — Progress Notes (Signed)
PROGRESS NOTE    BABYBOY PETRUCCELLI  [id number] DOB: Mar 21, 1941 DOA: 03/07/2019 PCP: Biagio Borg, MD    Brief Narrative:  78 year old gentleman with prior history of GERD, hypertension, , BPH presents to ED with nausea vomiting.  CT of the abdomen shows gastric outlet obstruction and a small 2 inches 1.7 cm nodular focus of soft tissue density in the region of pylorus.  Was admitted to [facility] service for further evaluation and general surgery and gastroenterology consulted. Patient underwent EGD on 03/08/19 which shows reflux esophagitis, gastric stenosis at the level of pylorus with severe luminal narrowing to roughly 3 mm with normal nodularity extending into the duodenal bulb.  Biopsies were taken.  A large amount of residual food in the stomach which was removed.  Erythematous mucosa in the gastric body present. GI recommended clear liquids for now and monitor.  Continue with IV PPI and will wait for pathology results. Assessment & Plan:   Active Problems:   Hypertension complicating diabetes ([place])   Gastric outlet obstruction   Malnutrition of moderate degree   Gastric outlet obstruction. Would explain patient's nausea and vomiting. Gastroenterology consulted and he underwent EGD today showing reflux esophagitis, gastric stenosis at the level of pylorus with severe luminal narrowing to roughly 3 mm with normal nodularity extending into the duodenal bulb.  Biopsies were taken.  A large amount of residual food in the stomach which was removed.  Erythematous mucosa in the gastric body present. GI recommended clear liquids for now and monitor.  Continue with IV PPI and will wait for pathology results. General surgery waiting biopsies to guide surgical management.    Malnutrition of moderate degree Nutrition consulted and recommendations given.   Hypertension Blood pressure parameters very well controlled.     Hyperglycemia Get hemoglobin A1c    Mild normocytic anemia  Continue to monitor    GERD Continue with PPI    DVT prophylaxis: scd's Code Status: full code.  Family Communication: none at bedside.  Disposition Plan: pending pathology reports and clinical improvement.    Consultants:   Gastroenterology.   General surgery.   Procedures: EGD on 03/08/2019  Antimicrobials: none.   Subjective: Would like to try liquids.  No ches tpain or sob. No abd pain.   Objective: Vitals:   03/08/19 1010 03/08/19 1020 03/08/19 1026 03/08/19 1348  BP: 126/74 120/75 119/80 126/89  Pulse: 77 74 77 68  Resp: 13 12 13 16   Temp:    97.6 F (36.4 C)  TempSrc:    Oral  SpO2: 100% 98% 99% 100%  Weight:      Height:        Intake/Output Summary (Last 24 hours) at 03/08/2019 1422 Last data filed at 03/08/2019 1010 Gross per 24 hour  Intake 3171.63 ml  Output 1675 ml  Net 1496.63 ml   Filed Weights   03/07/19 0934 03/07/19 1417  Weight: 79.4 kg 75.3 kg    Examination:  General exam: Appears calm and comfortable  Respiratory system: Clear to auscultation. Respiratory effort normal. Cardiovascular system: S1 & S2 heard, RRR. No JVD No pedal edema. Gastrointestinal system: Abdomen is nondistended, soft and nontender. No organomegaly or masses felt. Normal bowel sounds heard. Central nervous system: Alert and oriented. No focal neurological deficits. Extremities: Symmetric 5 x 5 power. Skin: No rashes, lesions or ulcers Psychiatry:  Mood & affect appropriate.     Data Reviewed: I have personally reviewed following labs and imaging studies  CBC: Recent Labs  Lab 03/05/19  1542 03/07/19 1000 03/08/19 0541  WBC 3.9* 5.4 4.4  NEUTROABS 1.6 3.7  --   HGB 13.5 13.6 12.4*  HCT 41.2 42.7 38.3*  MCV 86.0 86.8 87.4  PLT 195.0 188 [id number]   Basic Metabolic Panel: Recent Labs  Lab 03/05/19 1542 03/07/19 1000 03/08/19 0541  NA 137 135 140  K 4.1 4.3 3.9  CL 103 98 102  CO2 28 27 27   GLUCOSE 100* 124* 101*  BUN 17 18 18   CREATININE 1.13  1.11 1.04  CALCIUM 9.1 8.7* 8.7*   GFR: Estimated Creatinine Clearance: 55.7 mL/min (by C-G formula based on SCr of 1.04 mg/dL). Liver Function Tests: Recent Labs  Lab 03/05/19 1542 03/07/19 1000 03/08/19 0541  AST 15 20 14*  ALT 11 15 13   ALKPHOS 48 44 40  BILITOT 0.6 1.3* 0.9  PROT 6.9 7.1 6.2*  ALBUMIN 4.0 4.0 3.4*   Recent Labs  Lab 03/05/19 1542 03/07/19 1000  LIPASE 26.0 30   No results for input(s): AMMONIA in the last 168 hours. Coagulation Profile: No results for input(s): INR, PROTIME in the last 168 hours. Cardiac Enzymes: No results for input(s): CKTOTAL, CKMB, CKMBINDEX, TROPONINI in the last 168 hours. BNP (last 3 results) No results for input(s): PROBNP in the last 8760 hours. HbA1C: Recent Labs    03/07/19 1305  HGBA1C 5.9*   CBG: Recent Labs  Lab 03/07/19 1717 03/07/19 2120 03/08/19 1155  GLUCAP 70 82 97   Lipid Profile: No results for input(s): CHOL, HDL, LDLCALC, TRIG, CHOLHDL, LDLDIRECT in the last 72 hours. Thyroid Function Tests: No results for input(s): TSH, T4TOTAL, FREET4, T3FREE, THYROIDAB in the last 72 hours. Anemia Panel: No results for input(s): VITAMINB12, FOLATE, FERRITIN, TIBC, IRON, RETICCTPCT in the last 72 hours. Sepsis Labs: No results for input(s): PROCALCITON, LATICACIDVEN in the last 168 hours.  Recent Results (from the past 240 hour(s))  SARS CORONAVIRUS 2 (TAT 6-24 HRS) Nasopharyngeal Nasopharyngeal Swab     Status: None   Collection Time: 03/07/19 10:01 AM   Specimen: Nasopharyngeal Swab  Result Value Ref Range Status   SARS Coronavirus 2 NEGATIVE NEGATIVE Final    Comment: (NOTE) SARS-CoV-2 target nucleic acids are NOT DETECTED. The SARS-CoV-2 RNA is generally detectable in upper and lower respiratory specimens during the acute phase of infection. Negative results do not preclude SARS-CoV-2 infection, do not rule out co-infections with other pathogens, and should not be used as the sole basis for treatment  or other patient management decisions. Negative results must be combined with clinical observations, patient history, and epidemiological information. The expected result is Negative. Fact Sheet for Patients: SugarRoll.be Fact Sheet for Healthcare Providers: https://www.woods-mathews.com/ This test is not yet approved or cleared by the [place] FDA and  has been authorized for detection and/or diagnosis of SARS-CoV-2 by FDA under an Emergency Use Authorization (EUA). This EUA will remain  in effect (meaning this test can be used) for the duration of the COVID-19 declaration under Section 56 4(b)(1) of the Act, 21 U.S.C. section 360bbb-3(b)(1), unless the authorization is terminated or revoked sooner. Performed at [facility], [place] [address], [place], [place] [address]          Radiology Studies: Dg Abd 1 View  Result Date: 03/07/2019 CLINICAL DATA:  NG tube placement. EXAM: ABDOMEN - 1 VIEW COMPARISON:  CT yesterday FINDINGS: Tip and side port of the enteric tube below the diaphragm in the stomach. Enteric contrast throughout the colon from prior CT. No evidence  of free air. IMPRESSION: Tip and side port of the enteric tube below the diaphragm in the stomach. Electronically Signed   By: Keith Rake M.D.   On: 03/07/2019 19:31        Scheduled Meds: . feeding supplement  1 Container Oral TID BM  . feeding supplement (PRO-STAT SUGAR FREE 64)  30 mL Oral TID BM  . insulin aspart  0-9 Units Subcutaneous TID WC  . multivitamin  15 mL Per Tube Daily  . pantoprazole (PROTONIX) IV  40 mg Intravenous Q12H   Continuous Infusions: . sodium chloride 20 mL/hr at 03/07/19 1259  . sodium chloride 100 mL/hr at 03/07/19 2300     LOS: 1 day        Hosie Poisson, MD [facility]  03/08/2019, 2:22 PM

## 2019-03-08 NOTE — Progress Notes (Addendum)
[facility]/Trauma Progress Note  Day of Surgery   Assessment/Plan Hypertension Diabetes mellitus  Gastric outlet obstruction  - CT evidence of a GOO on his CT scan.   - Upper endoscopy 12/04 showed gastric stenosis at the pylorus, severe, not completely occluded but severe luminal narrowing to roughly 19mm or so, with nodularity extending into the duodenal bulb, biopsies taken   - We will continue to follow the patient and await biopsies prior to surgical intervention.   - This plan has been discussed with the patient. He expressed understanding  FEN -CLD, prostat and breeze VTE -okay for chemical prophylaxis from our standpoint ID -none currently needed Foley: none Follow up: TBD  DISPO: awaiting biopsies from endoscopy to guide surgical management. We will see again on Monday. Please page [place] with any questions or needs over the weekend.     LOS: 1 day    Subjective: CC: no complaints  Pt denies abdominal pain, nausea or vomiting. I discussed results of endoscopy and possible surgical paths pending biopsy results. He expressed understanding. No family at bedside.   Objective: Vital signs in last 24 hours: Temp:  [98.3 F (36.8 C)-98.4 F (36.9 C)] 98.4 F (36.9 C) (12/04 1005) Pulse Rate:  [63-77] 77 (12/04 1026) Resp:  [12-17] 13 (12/04 1026) BP: (98-132)/(72-80) 119/80 (12/04 1026) SpO2:  [98 %-100 %] 99 % (12/04 1026) Weight:  [75.3 kg] 75.3 kg (12/03 1417) Last BM Date: 03/06/19  Intake/Output from previous day: 12/03 0701 - 12/04 0700 In: 1471.6 [I.V.:1471.6] Out: 1475 [Urine:275; Emesis/NG output:1200] Intake/Output this shift: Total I/O In: 1700 [I.V.:1700] Out: 200 [Urine:200]  PE:  Gen:  Alert, NAD, pleasant, cooperative Pulm:  Rate and effort normal Abd: Soft, NT/ND, no HSM Skin: no rashes noted, warm and dry   Anti-infectives: Anti-infectives (From admission, onward)   None      Lab Results:  Recent Labs    03/07/19 1000  03/08/19 0541  WBC 5.4 4.4  HGB 13.6 12.4*  HCT 42.7 38.3*  PLT 188 191   BMET Recent Labs    03/07/19 1000 03/08/19 0541  NA 135 140  K 4.3 3.9  CL 98 102  CO2 27 27  GLUCOSE 124* 101*  BUN 18 18  CREATININE 1.11 1.04  CALCIUM 8.7* 8.7*   PT/INR No results for input(s): LABPROT, INR in the last 72 hours. CMP     Component Value Date/Time   NA 140 03/08/2019 0541   K 3.9 03/08/2019 0541   CL 102 03/08/2019 0541   CO2 27 03/08/2019 0541   GLUCOSE 101 (H) 03/08/2019 0541   BUN 18 03/08/2019 0541   CREATININE 1.04 03/08/2019 0541   CREATININE 1.06 07/16/2012 1038   CALCIUM 8.7 (L) 03/08/2019 0541   PROT 6.2 (L) 03/08/2019 0541   ALBUMIN 3.4 (L) 03/08/2019 0541   AST 14 (L) 03/08/2019 0541   ALT 13 03/08/2019 0541   ALKPHOS 40 03/08/2019 0541   BILITOT 0.9 03/08/2019 0541   GFRNONAA >60 03/08/2019 0541   GFRAA >60 03/08/2019 0541   Lipase     Component Value Date/Time   LIPASE 30 03/07/2019 1000    Studies/Results: Dg Abd 1 View  Result Date: 03/07/2019 CLINICAL DATA:  NG tube placement. EXAM: ABDOMEN - 1 VIEW COMPARISON:  CT yesterday FINDINGS: Tip and side port of the enteric tube below the diaphragm in the stomach. Enteric contrast throughout the colon from prior CT. No evidence of free air. IMPRESSION: Tip and side port of the  enteric tube below the diaphragm in the stomach. Electronically Signed   By: Keith Rake M.D.   On: 03/07/2019 19:31   Kalman Drape, [facility]  Agree with above. Pathology pending, but endoscopy findings suggestive of gastric/duodenal ca.  Alphonsa Overall, MD, [facility] Office phone:  [phone]

## 2019-03-08 NOTE — Op Note (Signed)
[facility] Patient Name: Jonathan Lewis Procedure Date: 03/08/2019 MRN: [id number] Attending MD: Carlota Raspberry. Havery Moros , MD Date of Birth: [birth year] CSN: [id number] Age: 78 Admit Type: Outpatient Procedure:                Upper GI endoscopy Indications:              Suspected pyloric stenosis with gastric outlet                            obstruction, Abnormal CT of the GI tract with                            possible pyloric mass, patient intubated for this                            procedure Providers:                Carlota Raspberry. Havery Moros, MD, Cleda Daub, RN, Lazaro Arms, Technician Referring MD:              Medicines:                Monitored Anesthesia Care Complications:            No immediate complications. Estimated blood loss:                            Minimal. Estimated Blood Loss:     Estimated blood loss was minimal. Procedure:                Pre-Anesthesia Assessment:                           - Prior to the procedure, a History and Physical                            was performed, and patient medications and                            allergies were reviewed. The patient's tolerance of                            previous anesthesia was also reviewed. The risks                            and benefits of the procedure and the sedation                            options and risks were discussed with the patient.                            All questions were answered, and informed consent                            was obtained. Prior  Anticoagulants: The patient has                            taken no previous anticoagulant or antiplatelet                            agents. ASA Grade Assessment: III - A patient with                            severe systemic disease. After reviewing the risks                            and benefits, the patient was deemed in                            satisfactory condition to undergo the  procedure.                           After obtaining informed consent, the endoscope was                            passed under direct vision. Throughout the                            procedure, the patient's blood pressure, pulse, and                            oxygen saturations were monitored continuously. The                            [id number]-[id number] ([id number]) Olympus gastroscope was                            introduced through the mouth, and advanced to the                            pylorus. The [id number] ([id number]) Olympus                            therapeutic endoscope was introduced through the                            mouth, and advanced to the antrum of the stomach.                            The [id number]-[id number] ([id number]) Olympus ultra slim                            endoscope was introduced through the mouth, and                            advanced to the second part of duodenum. The upper  GI endoscopy was accomplished without difficulty.                            The patient tolerated the procedure well. Scope In: Scope Out: Findings:      LA Grade D (one or more mucosal breaks involving at least 75% of       esophageal circumference) esophagitis was found.      The exam of the esophagus was otherwise normal.      A severe stenosis was found at the pylorus, roughly 3 mm in size, with       some nodularity within it / inflammatory changes and edema. . A standard       endoscope could not traverse it. The standard endoscope was replaced       with the ultraslim endoscope (65m diameter) and was traversed with mild       resistance. Biopsies were taken with a cold forceps for histology of the       stricture. No obvious ulcerations were noted.      A large amount of food was found in the gastric fundus. Removal of food       was accomplished by cominbination of using therapeutic endoscope for       suctioning and Roth net. This was time intensive but  mostly all of the       food was removed to reduce aspiration risk. Not much fluid was in the       stomach.      Patchy erythematous mucosa was found in the gastric body I suspect due       to NG tube trauma. AVMs possible but think less likely given recent NG       tube .      Nodular mucosa was found in the proximal duodenal bulb at the site of       the stricture. It was quite challenging to get good positioning given       luminal narrowing for biopsies but some were taken with a cold forceps       for histology.      The exam of the duodenum was otherwise normal. Impression:               - LA Grade D reflux esophagitis.                           - Gastric stenosis was found at the pylorus,                            severe, not completely occluded but severe luminal                            narrowing to roughly 329mor so, with nodularity                            extending into the duodenal bulb. Biopsied.                           - A largeamount of food (residue) in the stomach.  Removal was successful.                           - Erythematous mucosa in the gastric body I suspect                            due to NG tube trauma                           - Normal duodenum otherwise                           Concern for possible underlying pyloric malignancy,                            will await biopsies. Given there was a small amount                            of luminal patency, and only residual food in the                            stomach without fluird, NG tube was removed,                            patient should be able to tolerate clear liquids. Moderate Sedation:      No moderate sedation, case performed with MAC Recommendation:           - Return patient to hospital ward for ongoing care.                           - Will see how patient does without NG tube for now                           - Continue present medications including IV  PPI                           - Await pathology results.                           - General surgery consultation Procedure Code(s):        --- Professional ---                           [phone], Esophagogastroduodenoscopy, flexible,                            transoral; with removal of foreign body(s)                           43239, Esophagogastroduodenoscopy, flexible,                            transoral; with biopsy, single or multiple Diagnosis Code(s):        --- Professional ---  K21.00, Gastro-esophageal reflux disease with                            esophagitis, without bleeding                           K31.1, Adult hypertrophic pyloric stenosis                           T18.2XXA, Foreign body in stomach, initial encounter                           K31.89, Other diseases of stomach and duodenum                           R93.3, Abnormal findings on diagnostic imaging of                            other parts of digestive tract CPT copyright 2019 American Medical Association. All rights reserved. The codes documented in this report are preliminary and upon coder review may  be revised to meet current compliance requirements. Remo Lipps P. Armbruster, MD 03/08/2019 10:08:16 AM This report has been signed electronically. Number of Addenda: 0

## 2019-03-08 NOTE — Anesthesia Procedure Notes (Signed)
Procedure Name: Intubation Date/Time: 03/08/2019 8:35 AM Performed by: Maxwell Caul, CRNA Pre-anesthesia Checklist: Patient identified, Emergency Drugs available, Suction available and Patient being monitored Patient Re-evaluated:Patient Re-evaluated prior to induction Oxygen Delivery Method: Circle system utilized Preoxygenation: Pre-oxygenation with 100% oxygen Induction Type: IV induction and Rapid sequence Laryngoscope Size: Mac and 4 Grade View: Grade I Tube type: Oral Tube size: 7.5 mm Number of attempts: 1 Airway Equipment and Method: Stylet Placement Confirmation: ETT inserted through vocal cords under direct vision,  positive ETCO2 and breath sounds checked- equal and bilateral Secured at: 22 cm Tube secured with: Tape Dental Injury: Teeth and Oropharynx as per pre-operative assessment

## 2019-03-09 LAB — GLUCOSE, CAPILLARY
Glucose-Capillary: 85 mg/dL (ref 70–99)
Glucose-Capillary: 90 mg/dL (ref 70–99)
Glucose-Capillary: 62 mg/dL — ABNORMAL LOW (ref 70–99)
Glucose-Capillary: 84 mg/dL (ref 70–99)
Glucose-Capillary: 84 mg/dL (ref 70–99)

## 2019-03-09 LAB — CBC
HCT: 35.5 % — ABNORMAL LOW (ref 39.0–52.0)
Hemoglobin: 11.4 g/dL — ABNORMAL LOW (ref 13.0–17.0)
MCH: 28 pg (ref 26.0–34.0)
MCHC: 32.1 g/dL (ref 30.0–36.0)
MCV: 87.2 fL (ref 80.0–100.0)
Platelets: 178 10*3/uL (ref 150–400)
RBC: 4.07 MIL/uL — ABNORMAL LOW (ref 4.22–5.81)
RDW: 12 % (ref 11.5–15.5)
WBC: 5.3 10*3/uL (ref 4.0–10.5)
nRBC: 0 % (ref 0.0–0.2)

## 2019-03-09 LAB — BASIC METABOLIC PANEL
Anion gap: 7 (ref 5–15)
BUN: 15 mg/dL (ref 8–23)
CO2: 28 mmol/L (ref 22–32)
Calcium: 8.4 mg/dL — ABNORMAL LOW (ref 8.9–10.3)
Chloride: 105 mmol/L (ref 98–111)
Creatinine, Ser: 0.93 mg/dL (ref 0.61–1.24)
GFR calc Af Amer: >60 mL/min (ref >60)
GFR calc non Af Amer: >60 mL/min (ref >60)
Glucose, Bld: 110 mg/dL — ABNORMAL HIGH (ref 70–99)
Potassium: 3.8 mmol/L (ref 3.5–5.1)
Sodium: 140 mmol/L (ref 135–145)

## 2019-03-09 NOTE — Progress Notes (Signed)
      Progress Note   Subjective  Patient feeling okay - no abdominal pains or distension, tolerating clears. EGD yesterday. No events overnight   Objective   Vital signs in last 24 hours: Temp:  [98.2 F (36.8 C)-98.5 F (36.9 C)] 98.2 F (36.8 C) (12/05 1202) Pulse Rate:  [60-68] 60 (12/05 1202) Resp:  [15-20] 15 (12/05 1202) BP: (116-130)/(76-79) 121/78 (12/05 1202) SpO2:  [98 %-100 %] 99 % (12/05 1202) Last BM Date: 03/06/19 General:    AA male in NAD Heart:  Regular rate and rhythm; no murmurs Lungs: Respirations even and unlabored, lungs CTA bilaterally Abdomen:  Soft, nontender and nondistended. Extremities:  Without edema. Neurologic:  Alert and oriented,  grossly normal neurologically. Psych:  Cooperative. Normal mood and affect.  Intake/Output from previous day: 12/04 0701 - 12/05 0700 In: 2340 [P.O.:640; I.V.:1700] Out: 550 [Urine:550] Intake/Output this shift: Total I/O In: 200 [P.O.:200] Out: -   Lab Results: Recent Labs    03/07/19 1000 03/08/19 0541 03/09/19 0528  WBC 5.4 4.4 5.3  HGB 13.6 12.4* 11.4*  HCT 42.7 38.3* 35.5*  PLT 188 191 178   BMET Recent Labs    03/07/19 1000 03/08/19 0541 03/09/19 0528  NA 135 140 140  K 4.3 3.9 3.8  CL 98 102 105  CO2 27 27 28   GLUCOSE 124* 101* 110*  BUN 18 18 15   CREATININE 1.11 1.04 0.93  CALCIUM 8.7* 8.7* 8.4*   LFT Recent Labs    03/08/19 0541  PROT 6.2*  ALBUMIN 3.4*  AST 14*  ALT 13  ALKPHOS 40  BILITOT 0.9   PT/INR No results for input(s): LABPROT, INR in the last 72 hours.  Studies/Results: Dg Abd 1 View  Result Date: 03/07/2019 CLINICAL DATA:  NG tube placement. EXAM: ABDOMEN - 1 VIEW COMPARISON:  CT yesterday FINDINGS: Tip and side port of the enteric tube below the diaphragm in the stomach. Enteric contrast throughout the colon from prior CT. No evidence of free air. IMPRESSION: Tip and side port of the enteric tube below the diaphragm in the stomach. Electronically Signed    By: Keith Rake M.D.   On: 03/07/2019 19:31       Assessment / Plan:    78 y/o male who presented with gastric outlet obstruction. EGD yesterday showed severe narrowing of the pyloric outlet, only a few mm in diameter but was able to be traversed with the ultraslim scope (65mm diameter) and biopsied. He had a large food bezoar in the proximal stomach which was removed. He has tolerated some clear liquids since the procedure.  Major concern is malignancy in the area of pylorus / duodenal bulb causing this, although possible this represents benign disease as well, although I did not appreciate any ulcers in the pyloric channel / bulb which can often cause this. Several biopsies taken and pending, will likely not be back until Monday. Keep on liquid diet, appreciate surgical evaluation, continue PPI.   We will follow peripherally over the rest of the weekend and await pathology Monday.  Liberal Cellar, MD [facility] Gastroenterology

## 2019-03-09 NOTE — Progress Notes (Signed)
PROGRESS NOTE    Jonathan Lewis  [id number] DOB: 1941/02/29 DOA: 03/07/2019 PCP: Biagio Borg, MD    Brief Narrative:  78 year old gentleman with prior history of GERD, hypertension, , BPH presents to ED with nausea vomiting.  CT of the abdomen shows gastric outlet obstruction and a small 2 inches 1.7 cm nodular focus of soft tissue density in the region of pylorus.  Was admitted to [facility] service for further evaluation and general surgery and gastroenterology consulted. Patient underwent EGD on 03/08/19 which shows reflux esophagitis, gastric stenosis at the level of pylorus with severe luminal narrowing to roughly 3 mm with normal nodularity extending into the duodenal bulb.  Biopsies were taken.  A large amount of residual food in the stomach which was removed.  Erythematous mucosa in the gastric body present. GI recommended clear liquids for now and monitor.  Continue with IV PPI and will wait for pathology results. Assessment & Plan:   Active Problems:   Hypertension complicating diabetes ([place])   Gastric outlet obstruction   Malnutrition of moderate degree   Gastric outlet obstruction. Would explain patient's nausea and vomiting. Gastroenterology consulted and he underwent EGD today showing reflux esophagitis, gastric stenosis at the level of pylorus with severe luminal narrowing to roughly 3 mm with normal nodularity extending into the duodenal bulb.  Biopsies were taken.  A large amount of residual food in the stomach which was removed.  Erythematous mucosa in the gastric body present. GI recommended clear liquids for now and monitor.  Continue with IV PPI and will wait for pathology results. General surgery waiting biopsies to guide surgical management. Patient denies any nausea vomiting or abdominal pain today.  Plan to continue with clear liquid diet over the weekend and wait for the biopsy results.    Malnutrition of moderate degree Nutrition consulted and recommendations  given.   Hypertension Well-controlled     Hyperglycemia A1c is 5.9    Mild normocytic anemia Continue to monitor Hemoglobin stable around 11.   GERD Continue with PPI    DVT prophylaxis: scd's Code Status: full code.  Family Communication: none at bedside.  Disposition Plan: pending pathology reports and clinical improvement.    Consultants:   Gastroenterology.   General surgery.   Procedures: EGD on 03/08/2019  Antimicrobials: none.   Subjective: Patient denies any chest pain, shortness of breath, cough.  Nausea and vomiting resolved but patient reports persistent belching.  Objective: Vitals:   03/08/19 1348 03/08/19 2145 03/09/19 0520 03/09/19 1202  BP: 126/89 116/79 130/76 121/78  Pulse: 68 63 68 60  Resp: 16 20 20 15   Temp: 97.6 F (36.4 C) 98.5 F (36.9 C) 98.3 F (36.8 C) 98.2 F (36.8 C)  TempSrc: Oral Oral Oral Oral  SpO2: 100% 98% 100% 99%  Weight:      Height:        Intake/Output Summary (Last 24 hours) at 03/09/2019 1243 Last data filed at 03/08/2019 2150 Gross per 24 hour  Intake 640 ml  Output 350 ml  Net 290 ml   Filed Weights   03/07/19 0934 03/07/19 1417  Weight: 79.4 kg 75.3 kg    Examination:  General exam: Alert and comfortable Respiratory system: Clear to auscultation bilaterally, no wheezing or rhonchi Cardiovascular system: S1 S2 heard, regular rate rhythm Gastrointestinal system: Abdomen is soft, nontender, nondistended, bowel sounds within normal Central nervous system: Alert and oriented Extremities: No pedal edema Skin: No rashes Psychiatry: Mood is appropriate    Data Reviewed: I  have personally reviewed following labs and imaging studies  CBC: Recent Labs  Lab 03/05/19 1542 03/07/19 1000 03/08/19 0541 03/09/19 0528  WBC 3.9* 5.4 4.4 5.3  NEUTROABS 1.6 3.7  --   --   HGB 13.5 13.6 12.4* 11.4*  HCT 41.2 42.7 38.3* 35.5*  MCV 86.0 86.8 87.4 87.2  PLT 195.0 188 191 0000000   Basic Metabolic Panel:  Recent Labs  Lab 03/05/19 1542 03/07/19 1000 03/08/19 0541 03/09/19 0528  NA 137 135 140 140  K 4.1 4.3 3.9 3.8  CL 103 98 102 105  CO2 28 27 27 28   GLUCOSE 100* 124* 101* 110*  BUN 17 18 18 15   CREATININE 1.13 1.11 1.04 0.93  CALCIUM 9.1 8.7* 8.7* 8.4*   GFR: Estimated Creatinine Clearance: 62.3 mL/min (by C-G formula based on SCr of 0.93 mg/dL). Liver Function Tests: Recent Labs  Lab 03/05/19 1542 03/07/19 1000 03/08/19 0541  AST 15 20 14*  ALT 11 15 13   ALKPHOS 48 44 40  BILITOT 0.6 1.3* 0.9  PROT 6.9 7.1 6.2*  ALBUMIN 4.0 4.0 3.4*   Recent Labs  Lab 03/05/19 1542 03/07/19 1000  LIPASE 26.0 30   No results for input(s): AMMONIA in the last 168 hours. Coagulation Profile: No results for input(s): INR, PROTIME in the last 168 hours. Cardiac Enzymes: No results for input(s): CKTOTAL, CKMB, CKMBINDEX, TROPONINI in the last 168 hours. BNP (last 3 results) No results for input(s): PROBNP in the last 8760 hours. HbA1C: Recent Labs    03/07/19 1305  HGBA1C 5.9*   CBG: Recent Labs  Lab 03/08/19 1155 03/08/19 1621 03/08/19 2143 03/09/19 0734 03/09/19 1205  GLUCAP 97 111* 126* 85 90   Lipid Profile: No results for input(s): CHOL, HDL, LDLCALC, TRIG, CHOLHDL, LDLDIRECT in the last 72 hours. Thyroid Function Tests: No results for input(s): TSH, T4TOTAL, FREET4, T3FREE, THYROIDAB in the last 72 hours. Anemia Panel: No results for input(s): VITAMINB12, FOLATE, FERRITIN, TIBC, IRON, RETICCTPCT in the last 72 hours. Sepsis Labs: No results for input(s): PROCALCITON, LATICACIDVEN in the last 168 hours.  Recent Results (from the past 240 hour(s))  SARS CORONAVIRUS 2 (TAT 6-24 HRS) Nasopharyngeal Nasopharyngeal Swab     Status: None   Collection Time: 03/07/19 10:01 AM   Specimen: Nasopharyngeal Swab  Result Value Ref Range Status   SARS Coronavirus 2 NEGATIVE NEGATIVE Final    Comment: (NOTE) SARS-CoV-2 target nucleic acids are NOT DETECTED. The SARS-CoV-2  RNA is generally detectable in upper and lower respiratory specimens during the acute phase of infection. Negative results do not preclude SARS-CoV-2 infection, do not rule out co-infections with other pathogens, and should not be used as the sole basis for treatment or other patient management decisions. Negative results must be combined with clinical observations, patient history, and epidemiological information. The expected result is Negative. Fact Sheet for Patients: SugarRoll.be Fact Sheet for Healthcare Providers: https://www.woods-mathews.com/ This test is not yet approved or cleared by the [place] FDA and  has been authorized for detection and/or diagnosis of SARS-CoV-2 by FDA under an Emergency Use Authorization (EUA). This EUA will remain  in effect (meaning this test can be used) for the duration of the COVID-19 declaration under Section 56 4(b)(1) of the Act, 21 U.S.C. section 360bbb-3(b)(1), unless the authorization is terminated or revoked sooner. Performed at [facility], [place] [address], [place], [place] [address]          Radiology Studies: Dg Abd 1 View  Result Date:  03/07/2019 CLINICAL DATA:  NG tube placement. EXAM: ABDOMEN - 1 VIEW COMPARISON:  CT yesterday FINDINGS: Tip and side port of the enteric tube below the diaphragm in the stomach. Enteric contrast throughout the colon from prior CT. No evidence of free air. IMPRESSION: Tip and side port of the enteric tube below the diaphragm in the stomach. Electronically Signed   By: Keith Rake M.D.   On: 03/07/2019 19:31        Scheduled Meds: . feeding supplement  1 Container Oral TID BM  . feeding supplement (PRO-STAT SUGAR FREE 64)  30 mL Oral TID BM  . insulin aspart  0-9 Units Subcutaneous TID WC  . multivitamin  15 mL Per Tube Daily  . pantoprazole (PROTONIX) IV  40 mg Intravenous Q12H   Continuous Infusions: . sodium chloride 20 mL/hr at  03/07/19 1259  . sodium chloride 100 mL/hr at 03/09/19 0850     LOS: 2 days        Hosie Poisson, MD [facility]  03/09/2019, 12:43 PM

## 2019-03-09 NOTE — Progress Notes (Signed)
Hypoglycemic Event  CBG: 62  Treatment: 8 oz juice/soda  Symptoms: none  Follow-up CBG: Time:1830 CBG Result:84  Possible Reasons for Event: Inadequate meal intake  Comments/MD notified Dr Bo Merino, Darlina Sicilian

## 2019-03-10 ENCOUNTER — Encounter: Payer: Self-pay | Admitting: Internal Medicine

## 2019-03-10 LAB — GLUCOSE, CAPILLARY
Glucose-Capillary: 93 mg/dL (ref 70–99)
Glucose-Capillary: 77 mg/dL (ref 70–99)
Glucose-Capillary: 69 mg/dL — ABNORMAL LOW (ref 70–99)
Glucose-Capillary: 92 mg/dL (ref 70–99)

## 2019-03-10 MED ORDER — DEXTROSE-NACL 5-0.9 % IV SOLN
INTRAVENOUS | Status: AC
  Administered 2019-03-10 – 2019-03-15 (×6): via INTRAVENOUS

## 2019-03-10 NOTE — Assessment & Plan Note (Signed)
For symptomatic tx with anti emetic prn

## 2019-03-10 NOTE — Progress Notes (Signed)
PROGRESS NOTE    Jonathan Lewis  [id number] DOB: [birth year] DOA: 03/07/2019 PCP: Biagio Borg, MD    Brief Narrative:  78 year old gentleman with prior history of GERD, hypertension, , BPH presents to ED with nausea vomiting.  CT of the abdomen shows gastric outlet obstruction and a small 2 inches 1.7 cm nodular focus of soft tissue density in the region of pylorus.  Was admitted to [facility] service for further evaluation and general surgery and gastroenterology consulted. Patient underwent EGD on 03/08/19 which shows reflux esophagitis, gastric stenosis at the level of pylorus with severe luminal narrowing to roughly 3 mm with normal nodularity extending into the duodenal bulb.  Biopsies were taken.  A large amount of residual food in the stomach which was removed.  Erythematous mucosa in the gastric body present. GI recommended clear liquids for now and monitor.  Continue with IV PPI and will wait for pathology results. No events overnight, reports not feeling good, but no chest pain or sob, nausea or vomiting. Able to tolerate clear liquid diet.  Assessment & Plan:   Active Problems:   Hypertension complicating diabetes ([place])   Gastric outlet obstruction   Malnutrition of moderate degree   Gastric outlet obstruction. Would explain patient's nausea and vomiting. Gastroenterology consulted and he underwent EGD on 12/4 showing reflux esophagitis, gastric stenosis at the level of pylorus with severe luminal narrowing to roughly 3 mm with normal nodularity extending into the duodenal bulb.  Biopsies were taken.  A large amount of residual food in the stomach which was removed.  Erythematous mucosa in the gastric body present. GI recommended clear liquids for now and monitor.  Continue with IV PPI and will wait for pathology results. General surgery waiting biopsies to guide surgical management. No nausea or vomiting. Able to tolerate clear liquids.      Malnutrition of moderate degree  Continue with supplements.    Hypertension Well controlled.      Hyperglycemia A1c is 5.9 Monitor cbg's     Mild normocytic anemia Continue to monitor, baseline hemoglobin around 12, currently its at 11.4   GERD Continue with PPI    DVT prophylaxis: scd's Code Status: full code.  Family Communication: none at bedside.  Disposition Plan: pending pathology reports and clinical improvement.    Consultants:   Gastroenterology.   General surgery.   Procedures: EGD on 03/08/2019  Antimicrobials: none.   Subjective: Small BM this am. No nausea or vomiting. Continues to have belching. Able to tolerate clear liquids.   Objective: Vitals:   03/09/19 1202 03/09/19 2018 03/10/19 0559 03/10/19 0830  BP: 121/78 108/78 130/86   Pulse: 60 71 81   Resp: 15 18 18    Temp: 98.2 F (36.8 C) 98.3 F (36.8 C) 98.2 F (36.8 C)   TempSrc: Oral Oral Oral   SpO2: 99% 93% 98% 98%  Weight:      Height:        Intake/Output Summary (Last 24 hours) at 03/10/2019 1248 Last data filed at 03/10/2019 0925 Gross per 24 hour  Intake 3932.72 ml  Output -  Net 3932.72 ml   Filed Weights   03/07/19 0934 03/07/19 1417  Weight: 79.4 kg 75.3 kg    Examination:  General exam: alert and comfortable.  Respiratory system: Clear to auscultation bilaterally, no wheezing or rhonchi Cardiovascular system: S1-S2 heard, regular rate rhythm, no JVD, no pedal edema Gastrointestinal system: Abdomen is soft, nontender, nondistended, bowel sounds normal Central nervous system: Alert and oriented and  grossly nonfocal Extremities: No pedal edema, cyanosis Skin: No rashes Psychiatry: Mood is appropriate    Data Reviewed: I have personally reviewed following labs and imaging studies  CBC: Recent Labs  Lab 03/05/19 1542 03/07/19 1000 03/08/19 0541 03/09/19 0528  WBC 3.9* 5.4 4.4 5.3  NEUTROABS 1.6 3.7  --   --   HGB 13.5 13.6 12.4* 11.4*  HCT 41.2 42.7 38.3* 35.5*  MCV 86.0 86.8 87.4  87.2  PLT 195.0 188 191 0000000   Basic Metabolic Panel: Recent Labs  Lab 03/05/19 1542 03/07/19 1000 03/08/19 0541 03/09/19 0528  NA 137 135 140 140  K 4.1 4.3 3.9 3.8  CL 103 98 102 105  CO2 28 27 27 28   GLUCOSE 100* 124* 101* 110*  BUN 17 18 18 15   CREATININE 1.13 1.11 1.04 0.93  CALCIUM 9.1 8.7* 8.7* 8.4*   GFR: Estimated Creatinine Clearance: 62.3 mL/min (by C-G formula based on SCr of 0.93 mg/dL). Liver Function Tests: Recent Labs  Lab 03/05/19 1542 03/07/19 1000 03/08/19 0541  AST 15 20 14*  ALT 11 15 13   ALKPHOS 48 44 40  BILITOT 0.6 1.3* 0.9  PROT 6.9 7.1 6.2*  ALBUMIN 4.0 4.0 3.4*   Recent Labs  Lab 03/05/19 1542 03/07/19 1000  LIPASE 26.0 30   No results for input(s): AMMONIA in the last 168 hours. Coagulation Profile: No results for input(s): INR, PROTIME in the last 168 hours. Cardiac Enzymes: No results for input(s): CKTOTAL, CKMB, CKMBINDEX, TROPONINI in the last 168 hours. BNP (last 3 results) No results for input(s): PROBNP in the last 8760 hours. HbA1C: Recent Labs    03/07/19 1305  HGBA1C 5.9*   CBG: Recent Labs  Lab 03/09/19 1711 03/09/19 1856 03/09/19 2017 03/10/19 0713 03/10/19 1202  GLUCAP 62* 84 84 93 77   Lipid Profile: No results for input(s): CHOL, HDL, LDLCALC, TRIG, CHOLHDL, LDLDIRECT in the last 72 hours. Thyroid Function Tests: No results for input(s): TSH, T4TOTAL, FREET4, T3FREE, THYROIDAB in the last 72 hours. Anemia Panel: No results for input(s): VITAMINB12, FOLATE, FERRITIN, TIBC, IRON, RETICCTPCT in the last 72 hours. Sepsis Labs: No results for input(s): PROCALCITON, LATICACIDVEN in the last 168 hours.  Recent Results (from the past 240 hour(s))  SARS CORONAVIRUS 2 (TAT 6-24 HRS) Nasopharyngeal Nasopharyngeal Swab     Status: None   Collection Time: 03/07/19 10:01 AM   Specimen: Nasopharyngeal Swab  Result Value Ref Range Status   SARS Coronavirus 2 NEGATIVE NEGATIVE Final    Comment: (NOTE) SARS-CoV-2  target nucleic acids are NOT DETECTED. The SARS-CoV-2 RNA is generally detectable in upper and lower respiratory specimens during the acute phase of infection. Negative results do not preclude SARS-CoV-2 infection, do not rule out co-infections with other pathogens, and should not be used as the sole basis for treatment or other patient management decisions. Negative results must be combined with clinical observations, patient history, and epidemiological information. The expected result is Negative. Fact Sheet for Patients: SugarRoll.be Fact Sheet for Healthcare Providers: https://www.woods-mathews.com/ This test is not yet approved or cleared by the [place] FDA and  has been authorized for detection and/or diagnosis of SARS-CoV-2 by FDA under an Emergency Use Authorization (EUA). This EUA will remain  in effect (meaning this test can be used) for the duration of the COVID-19 declaration under Section 56 4(b)(1) of the Act, 21 U.S.C. section 360bbb-3(b)(1), unless the authorization is terminated or revoked sooner. Performed at [facility], [place],  [place] [address]          Radiology Studies: No results found.      Scheduled Meds: . feeding supplement  1 Container Oral TID BM  . feeding supplement (PRO-STAT SUGAR FREE 64)  30 mL Oral TID BM  . insulin aspart  0-9 Units Subcutaneous TID WC  . multivitamin  15 mL Per Tube Daily  . pantoprazole (PROTONIX) IV  40 mg Intravenous Q12H   Continuous Infusions: . sodium chloride 20 mL/hr at 03/07/19 1259  . sodium chloride 100 mL/hr at 03/10/19 1100     LOS: 3 days        Hosie Poisson, MD [facility]  03/10/2019, 12:48 PM

## 2019-03-10 NOTE — Assessment & Plan Note (Signed)
Most likely secondary, for tx and evaluation as above

## 2019-03-10 NOTE — Progress Notes (Signed)
Hypoglycemic Event  CBG: 69  Treatment: 8 oz juice/soda  Symptoms: none  Follow-up CBG: Time:CBG Result  Possible Reasons for Event: inadequate food intake  Comments/MD notified:Md notified, new orders given.    Norva Pavlov

## 2019-03-10 NOTE — Assessment & Plan Note (Addendum)
Etiology unclear, for CT abd/pelvis and consider GI as suspect he has a gastric issue  Note:  Total time for pt hx, exam, review of record with pt in the room, determination of diagnoses and plan for further eval and tx is > 40 min, with over 50% spent in coordination and counseling of patient including the differential dx, tx, further evaluation and other management of epigastric pain, n/v, wt loss, decreaed appetite

## 2019-03-10 NOTE — Assessment & Plan Note (Signed)
Pt afeb, somewhat suspicious for malignancy, for tx and eval as above

## 2019-03-11 ENCOUNTER — Other Ambulatory Visit: Payer: Self-pay

## 2019-03-11 DIAGNOSIS — E440 Moderate protein-calorie malnutrition: Secondary | ICD-10-CM

## 2019-03-11 DIAGNOSIS — K311 Adult hypertrophic pyloric stenosis: Secondary | ICD-10-CM

## 2019-03-11 LAB — CBC
HCT: 38.8 % — ABNORMAL LOW (ref 39.0–52.0)
Hemoglobin: 12.5 g/dL — ABNORMAL LOW (ref 13.0–17.0)
MCH: 28.3 pg (ref 26.0–34.0)
MCHC: 32.2 g/dL (ref 30.0–36.0)
MCV: 88 fL (ref 80.0–100.0)
Platelets: 192 10*3/uL (ref 150–400)
RBC: 4.41 MIL/uL (ref 4.22–5.81)
RDW: 12 % (ref 11.5–15.5)
WBC: 4.7 10*3/uL (ref 4.0–10.5)
nRBC: 0 % (ref 0.0–0.2)

## 2019-03-11 LAB — BASIC METABOLIC PANEL
Anion gap: 7 (ref 5–15)
BUN: 12 mg/dL (ref 8–23)
CO2: 27 mmol/L (ref 22–32)
Calcium: 8.7 mg/dL — ABNORMAL LOW (ref 8.9–10.3)
Chloride: 105 mmol/L (ref 98–111)
Creatinine, Ser: 0.96 mg/dL (ref 0.61–1.24)
GFR calc Af Amer: >60 mL/min (ref >60)
GFR calc non Af Amer: >60 mL/min (ref >60)
Glucose, Bld: 90 mg/dL (ref 70–99)
Potassium: 3.9 mmol/L (ref 3.5–5.1)
Sodium: 139 mmol/L (ref 135–145)

## 2019-03-11 LAB — GLUCOSE, CAPILLARY
Glucose-Capillary: 94 mg/dL (ref 70–99)
Glucose-Capillary: 114 mg/dL — ABNORMAL HIGH (ref 70–99)
Glucose-Capillary: 95 mg/dL (ref 70–99)
Glucose-Capillary: 76 mg/dL (ref 70–99)

## 2019-03-11 NOTE — Progress Notes (Signed)
[facility]/Trauma Progress Note  3 Days Post-Op   Assessment/Plan Hypertension Diabetes mellitus  Gastric outlet obstruction - CT evidence of a GOO on his CT scan.  - Upper endoscopy 12/04 showed gastric stenosis at the pylorus, severe, not completely occluded but severe luminal narrowing to roughly 62mm or so, with nodularity extending into the duodenal bulb, biopsies taken  - We will continue to follow the patient and await biopsies prior to surgical intervention.  - This plan has been discussed with the patient. He expressed understanding  FEN -CLD, prostat and breeze VTE -okay for chemical prophylaxis from our standpoint ID -none currently needed Foley: none Follow up: TBD  DISPO: awaiting biopsies from endoscopy to guide surgical management.    LOS: 4 days    Subjective: CC: no complaints  Pt states he was not feeling well yesterday but is feeling better today. No nausea today and he is tolerating CLD. We tried to call his wife but she did not answer.   Objective: Vital signs in last 24 hours: Temp:  [98.5 F (36.9 C)-98.6 F (37 C)] 98.5 F (36.9 C) (12/07 0611) Pulse Rate:  [61-63] 63 (12/07 0611) Resp:  [15-18] 15 (12/07 0611) BP: (111-140)/(75-95) 111/75 (12/07 0611) SpO2:  [100 %] 100 % (12/07 0611) Last BM Date: 03/06/19  Intake/Output from previous day: 12/06 0701 - 12/07 0700 In: 3410 [P.O.:1320; I.V.:2090] Out: -  Intake/Output this shift: No intake/output data recorded.  PE:  Gen:  Alert, NAD, pleasant, cooperative, pt at sink washing up Pulm:  Rate and effort normal Skin: no rashes noted   Anti-infectives: Anti-infectives (From admission, onward)   None      Lab Results:  Recent Labs    03/09/19 0528 03/11/19 0515  WBC 5.3 4.7  HGB 11.4* 12.5*  HCT 35.5* 38.8*  PLT 178 192   BMET Recent Labs    03/09/19 0528 03/11/19 0515  NA 140 139  K 3.8 3.9  CL 105 105  CO2 28 27  GLUCOSE 110* 90  BUN 15 12   CREATININE 0.93 0.96  CALCIUM 8.4* 8.7*   PT/INR No results for input(s): LABPROT, INR in the last 72 hours. CMP     Component Value Date/Time   NA 139 03/11/2019 0515   K 3.9 03/11/2019 0515   CL 105 03/11/2019 0515   CO2 27 03/11/2019 0515   GLUCOSE 90 03/11/2019 0515   BUN 12 03/11/2019 0515   CREATININE 0.96 03/11/2019 0515   CREATININE 1.06 07/16/2012 1038   CALCIUM 8.7 (L) 03/11/2019 0515   PROT 6.2 (L) 03/08/2019 0541   ALBUMIN 3.4 (L) 03/08/2019 0541   AST 14 (L) 03/08/2019 0541   ALT 13 03/08/2019 0541   ALKPHOS 40 03/08/2019 0541   BILITOT 0.9 03/08/2019 0541   GFRNONAA >60 03/11/2019 0515   GFRAA >60 03/11/2019 0515   Lipase     Component Value Date/Time   LIPASE 30 03/07/2019 1000    Studies/Results: No results found.   Kalman Drape, PA-C [facility] Please see amion for pager for the following: Myna Hidalgo, W, & Friday 7:00am - 4:30pm Thursdays 7:00am -11:30am

## 2019-03-11 NOTE — Progress Notes (Signed)
Biopsies negative for cancer. Will proceed with EUS and EGD with probable dilation of stricture tomorrow.

## 2019-03-11 NOTE — Care Management Important Message (Signed)
Important Message  Patient Details IM Letter given to Rhea Pink SW to present to the Patient Name: Jonathan Lewis MRN: [id number] Date of Birth: Aug 12, 1940   Medicare Important Message Given:  Yes     Kerin Salen 03/11/2019, 10:16 AM

## 2019-03-11 NOTE — Progress Notes (Signed)
PROGRESS NOTE    Jonathan Lewis  [id number] DOB: Aug 08, 1940 DOA: 03/07/2019 PCP: Biagio Borg, MD    Brief Narrative:  78 year old gentleman with prior history of GERD, hypertension, , BPH presents to ED with nausea vomiting.  CT of the abdomen shows gastric outlet obstruction and a small 2 inches 1.7 cm nodular focus of soft tissue density in the region of pylorus.  Was admitted to [facility] service for further evaluation and general surgery and gastroenterology consulted. Patient underwent EGD on 03/08/19 which shows reflux esophagitis, gastric stenosis at the level of pylorus with severe luminal narrowing to roughly 3 mm with normal nodularity extending into the duodenal bulb.  Biopsies were taken.  A large amount of residual food in the stomach which was removed.  Erythematous mucosa in the gastric body present. GI recommended clear liquids for now and monitor.  Continue with IV PPI and will wait for pathology results.  No events overnight, Biopsies negative for cancer, GI to plan for EUS  And EGD    Assessment & Plan:   Active Problems:   Hypertension complicating diabetes ([place])   Gastric outlet obstruction   Malnutrition of moderate degree   Gastric outlet obstruction. Would explain patient's nausea and vomiting. Gastroenterology consulted and he underwent EGD on 12/4 showing reflux esophagitis, gastric stenosis at the level of pylorus with severe luminal narrowing to roughly 3 mm with normal nodularity extending into the duodenal bulb.  Biopsies were taken.  A large amount of residual food in the stomach which was removed.  Erythematous mucosa in the gastric body present. GI recommended clear liquids for now and monitor.  Continue with IV PPI and will wait for pathology results. General surgery waiting biopsies to guide surgical management. Biopsies negative for cancer. Plan for EGD in am as per GI.      Malnutrition of moderate degree Continue with supplements and continue  with dextrose fluids.    Hypertension Well controlled.      Hyperglycemia A1c is 5.9 CBG (last 3)  Recent Labs    03/11/19 0748 03/11/19 1123 03/11/19 1633  GLUCAP 94 114* 95   Continue with dextrose fluids.     Mild normocytic anemia Hemoglobin stable around 12.Marland Kitchen    GERD Stable,     DVT prophylaxis: scd's Code Status: full code.  Family Communication: none at bedside.  Disposition Plan: pending GI work up    Consultants:   Gastroenterology.   General surgery.   Procedures: EGD on 03/08/2019  Antimicrobials: none.   Subjective: He reports feeling better, no chest pain or sob.  No nausea or vomiting.   Objective: Vitals:   03/10/19 2035 03/10/19 2035 03/11/19 0611 03/11/19 1326  BP: (!) 140/95 (!) 140/95 111/75 100/68  Pulse: 63 61 63 62  Resp: 18 18 15 18   Temp: 98.6 F (37 C) 98.6 F (37 C) 98.5 F (36.9 C) 98.3 F (36.8 C)  TempSrc: Oral Oral Oral Oral  SpO2: 100% 100% 100% 91%  Weight:      Height:        Intake/Output Summary (Last 24 hours) at 03/11/2019 1757 Last data filed at 03/11/2019 1539 Gross per 24 hour  Intake 2467.95 ml  Output 200 ml  Net 2267.95 ml   Filed Weights   03/07/19 0934 03/07/19 1417  Weight: 79.4 kg 75.3 kg    Examination:  General exam: alert and comfortable,  Respiratory system: clear to auscultation, no wheezing or rhonchi.  Cardiovascular system: S1S2 , RRR, no JVD.  Gastrointestinal system: abd is soft, NT ND BS+ Central nervous system:  Alert and non focal.  Extremities: no cyanosis or clubbing.  Skin: no rashes, no ulcers.  Psychiatry: mood is appropriate.     Data Reviewed: I have personally reviewed following labs and imaging studies  CBC: Recent Labs  Lab 03/05/19 1542 03/07/19 1000 03/08/19 0541 03/09/19 0528 03/11/19 0515  WBC 3.9* 5.4 4.4 5.3 4.7  NEUTROABS 1.6 3.7  --   --   --   HGB 13.5 13.6 12.4* 11.4* 12.5*  HCT 41.2 42.7 38.3* 35.5* 38.8*  MCV 86.0 86.8 87.4 87.2  88.0  PLT 195.0 188 191 178 [id number]   Basic Metabolic Panel: Recent Labs  Lab 03/05/19 1542 03/07/19 1000 03/08/19 0541 03/09/19 0528 03/11/19 0515  NA 137 135 140 140 139  K 4.1 4.3 3.9 3.8 3.9  CL 103 98 102 105 105  CO2 28 27 27 28 27   GLUCOSE 100* 124* 101* 110* 90  BUN 17 18 18 15 12   CREATININE 1.13 1.11 1.04 0.93 0.96  CALCIUM 9.1 8.7* 8.7* 8.4* 8.7*   GFR: Estimated Creatinine Clearance: 60.4 mL/min (by C-G formula based on SCr of 0.96 mg/dL). Liver Function Tests: Recent Labs  Lab 03/05/19 1542 03/07/19 1000 03/08/19 0541  AST 15 20 14*  ALT 11 15 13   ALKPHOS 48 44 40  BILITOT 0.6 1.3* 0.9  PROT 6.9 7.1 6.2*  ALBUMIN 4.0 4.0 3.4*   Recent Labs  Lab 03/05/19 1542 03/07/19 1000  LIPASE 26.0 30   No results for input(s): AMMONIA in the last 168 hours. Coagulation Profile: No results for input(s): INR, PROTIME in the last 168 hours. Cardiac Enzymes: No results for input(s): CKTOTAL, CKMB, CKMBINDEX, TROPONINI in the last 168 hours. BNP (last 3 results) No results for input(s): PROBNP in the last 8760 hours. HbA1C: No results for input(s): HGBA1C in the last 72 hours. CBG: Recent Labs  Lab 03/10/19 1608 03/10/19 2033 03/11/19 0748 03/11/19 1123 03/11/19 1633  GLUCAP 69* 92 94 114* 95   Lipid Profile: No results for input(s): CHOL, HDL, LDLCALC, TRIG, CHOLHDL, LDLDIRECT in the last 72 hours. Thyroid Function Tests: No results for input(s): TSH, T4TOTAL, FREET4, T3FREE, THYROIDAB in the last 72 hours. Anemia Panel: No results for input(s): VITAMINB12, FOLATE, FERRITIN, TIBC, IRON, RETICCTPCT in the last 72 hours. Sepsis Labs: No results for input(s): PROCALCITON, LATICACIDVEN in the last 168 hours.  Recent Results (from the past 240 hour(s))  SARS CORONAVIRUS 2 (TAT 6-24 HRS) Nasopharyngeal Nasopharyngeal Swab     Status: None   Collection Time: 03/07/19 10:01 AM   Specimen: Nasopharyngeal Swab  Result Value Ref Range Status   SARS Coronavirus 2  NEGATIVE NEGATIVE Final    Comment: (NOTE) SARS-CoV-2 target nucleic acids are NOT DETECTED. The SARS-CoV-2 RNA is generally detectable in upper and lower respiratory specimens during the acute phase of infection. Negative results do not preclude SARS-CoV-2 infection, do not rule out co-infections with other pathogens, and should not be used as the sole basis for treatment or other patient management decisions. Negative results must be combined with clinical observations, patient history, and epidemiological information. The expected result is Negative. Fact Sheet for Patients: SugarRoll.be Fact Sheet for Healthcare Providers: https://www.woods-mathews.com/ This test is not yet approved or cleared by the [place] FDA and  has been authorized for detection and/or diagnosis of SARS-CoV-2 by FDA under an Emergency Use Authorization (EUA). This EUA will remain  in effect (meaning this test can be  used) for the duration of the COVID-19 declaration under Section 56 4(b)(1) of the Act, 21 U.S.C. section 360bbb-3(b)(1), unless the authorization is terminated or revoked sooner. Performed at [facility], [place] [address] [place], [place] [address]          Radiology Studies: No results found.      Scheduled Meds: . feeding supplement  1 Container Oral TID BM  . feeding supplement (PRO-STAT SUGAR FREE 64)  30 mL Oral TID BM  . insulin aspart  0-9 Units Subcutaneous TID WC  . multivitamin  15 mL Per Tube Daily  . pantoprazole (PROTONIX) IV  40 mg Intravenous Q12H   Continuous Infusions: . sodium chloride 20 mL/hr at 03/07/19 1259  . sodium chloride 100 mL/hr at 03/10/19 1100  . dextrose 5 % and 0.9% NaCl 75 mL/hr at 03/10/19 1730     LOS: 4 days        Hosie Poisson, MD [facility]  03/11/2019, 5:57 PM

## 2019-03-11 NOTE — Progress Notes (Signed)
[facility] ROUNDING NOTE   Subjective: Feels well, tolerating liquids.  Wife is bedside.  Denies any abdominal pain, nausea or vomiting   Objective: Vital signs in last 24 hours: Temp:  [98.5 F (36.9 C)-98.6 F (37 C)] 98.5 F (36.9 C) (12/07 0611) Pulse Rate:  [61-63] 63 (12/07 0611) Resp:  [15-18] 15 (12/07 0611) BP: (111-140)/(75-95) 111/75 (12/07 0611) SpO2:  [100 %] 100 % (12/07 0611) Last BM Date: 03/06/19 General: NAD Abdomen soft no distention, nontender CV: S1-S2 regular rate and rhythm Lungs clear  Intake/Output from previous day: 12/06 0701 - 12/07 0700 In: [id number] [P.O.:1320; I.V.:2090] Out: -  Intake/Output this shift: Total I/O In: 240 [P.O.:240] Out: 200 [Urine:200]   Lab Results: Recent Labs    03/09/19 0528 03/11/19 0515  WBC 5.3 4.7  HGB 11.4* 12.5*  PLT 178 192  MCV 87.2 88.0   BMET Recent Labs    03/09/19 0528 03/11/19 0515  NA 140 139  K 3.8 3.9  CL 105 105  CO2 28 27  GLUCOSE 110* 90  BUN 15 12  CREATININE 0.93 0.96  CALCIUM 8.4* 8.7*   LFT No results for input(s): PROT, ALBUMIN, AST, ALT, ALKPHOS, BILITOT, BILIDIR, IBILI in the last 72 hours. PT/INR No results for input(s): INR in the last 72 hours.    Imaging/Other results: No results found.    Assessment &Plan  78 year old male admitted with gastric outlet obstruction, EGD March 08, 2019 showed severe erosive esophagitis, gastritis with gastric food bizarre and pyloric channel stenosis.  Biopsies pending  Surgery team is following patient as well, tentative plan for surgery based on biopsy results Continue PPI Continue liquid diet Available if have any questions or concerns, please call  K. Denzil Magnuson , MD [phone]  [facility]

## 2019-03-11 NOTE — H&P (View-Only) (Signed)
Biopsies negative for cancer. Will proceed with EUS and EGD with probable dilation of stricture tomorrow.

## 2019-03-12 ENCOUNTER — Inpatient Hospital Stay (HOSPITAL_COMMUNITY): Payer: Medicare Other | Admitting: Certified Registered"

## 2019-03-12 ENCOUNTER — Encounter (HOSPITAL_COMMUNITY): Admission: EM | Disposition: A | Discharge status: Home Health | Payer: Self-pay | Source: Home / Self Care

## 2019-03-12 ENCOUNTER — Encounter (HOSPITAL_COMMUNITY): Payer: Self-pay | Admitting: Gastroenterology

## 2019-03-12 ENCOUNTER — Inpatient Hospital Stay (HOSPITAL_COMMUNITY): Payer: Medicare Other

## 2019-03-12 DIAGNOSIS — K209 Esophagitis, unspecified without bleeding: Secondary | ICD-10-CM

## 2019-03-12 DIAGNOSIS — K922 Gastrointestinal hemorrhage, unspecified: Secondary | ICD-10-CM

## 2019-03-12 HISTORY — PX: FINE NEEDLE ASPIRATION: SHX5430

## 2019-03-12 HISTORY — PX: ESOPHAGOGASTRODUODENOSCOPY (EGD) WITH PROPOFOL: SHX5813

## 2019-03-12 HISTORY — PX: UPPER ESOPHAGEAL ENDOSCOPIC ULTRASOUND (EUS): SHX6562

## 2019-03-12 HISTORY — PX: BALLOON DILATION: SHX5330

## 2019-03-12 HISTORY — PX: BIOPSY: SHX5522

## 2019-03-12 LAB — GLUCOSE, CAPILLARY
Glucose-Capillary: 133 mg/dL — ABNORMAL HIGH (ref 70–99)
Glucose-Capillary: 96 mg/dL (ref 70–99)
Glucose-Capillary: 124 mg/dL — ABNORMAL HIGH (ref 70–99)
Glucose-Capillary: 111 mg/dL — ABNORMAL HIGH (ref 70–99)
Glucose-Capillary: 94 mg/dL (ref 70–99)

## 2019-03-12 LAB — SURGICAL PATHOLOGY

## 2019-03-12 SURGERY — UPPER ESOPHAGEAL ENDOSCOPIC ULTRASOUND (EUS)
Anesthesia: Monitor Anesthesia Care

## 2019-03-12 MED ORDER — FENTANYL CITRATE (PF) 100 MCG/2ML IJ SOLN
INTRAMUSCULAR | Status: AC
  Filled 2019-03-12: qty 2

## 2019-03-12 MED ORDER — FENTANYL CITRATE (PF) 100 MCG/2ML IJ SOLN
INTRAMUSCULAR | Status: DC | PRN
  Administered 2019-03-12: 50 ug via INTRAVENOUS
  Administered 2019-03-12 (×2): 25 ug via INTRAVENOUS

## 2019-03-12 MED ORDER — PROPOFOL 500 MG/50ML IV EMUL
INTRAVENOUS | Status: DC | PRN
  Administered 2019-03-12: 135 ug/kg/min via INTRAVENOUS

## 2019-03-12 MED ORDER — LIDOCAINE 2% (20 MG/ML) 5 ML SYRINGE
INTRAMUSCULAR | Status: DC | PRN
  Administered 2019-03-12: 50 mg via INTRAVENOUS

## 2019-03-12 MED ORDER — EPHEDRINE SULFATE-NACL 50-0.9 MG/10ML-% IV SOSY
PREFILLED_SYRINGE | INTRAVENOUS | Status: DC | PRN
  Administered 2019-03-12 (×3): 10 mg via INTRAVENOUS

## 2019-03-12 MED ORDER — CIPROFLOXACIN IN D5W 400 MG/200ML IV SOLN
INTRAVENOUS | Status: DC | PRN
  Administered 2019-03-12: 400 mg via INTRAVENOUS

## 2019-03-12 MED ORDER — PROPOFOL 10 MG/ML IV BOLUS
INTRAVENOUS | Status: DC | PRN
  Administered 2019-03-12: 100 mg via INTRAVENOUS
  Administered 2019-03-12 (×2): 20 mg via INTRAVENOUS

## 2019-03-12 MED ORDER — ONDANSETRON HCL 4 MG/2ML IJ SOLN
INTRAMUSCULAR | Status: DC | PRN
  Administered 2019-03-12: 4 mg via INTRAVENOUS

## 2019-03-12 MED ORDER — SUCCINYLCHOLINE CHLORIDE 200 MG/10ML IV SOSY
PREFILLED_SYRINGE | INTRAVENOUS | Status: DC | PRN
  Administered 2019-03-12: 100 mg via INTRAVENOUS

## 2019-03-12 MED ORDER — CIPROFLOXACIN IN D5W 400 MG/200ML IV SOLN
INTRAVENOUS | Status: AC
  Filled 2019-03-12: qty 200

## 2019-03-12 MED ORDER — PROPOFOL 500 MG/50ML IV EMUL
INTRAVENOUS | Status: AC
  Filled 2019-03-12: qty 50

## 2019-03-12 SURGICAL SUPPLY — 15 items

## 2019-03-12 NOTE — Anesthesia Procedure Notes (Signed)
Procedure Name: MAC Date/Time: 03/12/2019 12:58 PM Performed by: Niel Hummer, CRNA Pre-anesthesia Checklist: Patient identified, Suction available, Emergency Drugs available and Patient being monitored Patient Re-evaluated:Patient Re-evaluated prior to induction Oxygen Delivery Method: Simple face mask

## 2019-03-12 NOTE — Interval H&P Note (Signed)
History and Physical Interval Note:  03/12/2019 12:45 PM  Jonathan Lewis  has presented today for surgery, with the diagnosis of duodenal stricture.  The various methods of treatment have been discussed with the patient and family. After consideration of risks, benefits and other options for treatment, the patient has consented to  Procedure(s): UPPER ESOPHAGEAL ENDOSCOPIC ULTRASOUND (EUS) (N/A) ESOPHAGOGASTRODUODENOSCOPY (EGD) WITH PROPOFOL (N/A) as a surgical intervention.  The patient's history has been reviewed, patient examined, no change in status, stable for surgery.  I have reviewed the patient's chart and labs.  Questions were answered to the patient's satisfaction.     [employer]

## 2019-03-12 NOTE — Final Consult Note (Signed)
Consultant Final Sign-Off Note    Assessment/Final recommendations  Jonathan Lewis is a 78 y.o. male followed by me for:  Gastric outlet obstruction - CTevidence of aGOOon his CT scan.  - Upper endoscopy 12/04 showed gastric stenosis at the pylorus, severe, not completely occluded but severe luminal narrowing to roughly 42mm or so, with nodularity extending into the duodenal bulb, biopsies showed no malignancy - GI planning EUS and EGD with probably dilation of stricture.  - no indication for surgical management at this time.    Wound care (if applicable):    Diet at discharge: per primary team   Activity at discharge: per primary team   Follow-up appointment:  None needed at this time   Pending results:  Unresulted Labs (From admission, onward)   None       Medication recommendations:    Other recommendations:    Thank you for allowing [place] to participate in the care of your patient!  Please consult [place] again if you have further needs for your patient.  Kalman Drape 03/12/2019 10:36 AM    Subjective   Pt is feeling better today. No issues overnight. He has no questions. I discussed that no surgical intervention was indicated at this time. He expressed understanding.   Objective  Vital signs in last 24 hours: Temp:  [98.2 F (36.8 C)-98.7 F (37.1 C)] 98.7 F (37.1 C) (12/08 0621) Pulse Rate:  [58-65] 65 (12/08 0621) Resp:  [17-18] 17 (12/08 0621) BP: (100-137)/(68-91) 126/79 (12/08 0621) SpO2:  [91 %-100 %] 98 % (12/08 [id number])  PE:  Gen:  Alert, NAD, pleasant, cooperative, pt at sink brushing teeth Pulm:  Rate and effort normal Skin: no rashes noted  Pertinent labs and Studies: Recent Labs    03/11/19 0515  WBC 4.7  HGB 12.5*  HCT 38.8*   BMET Recent Labs    03/11/19 0515  NA 139  K 3.9  CL 105  CO2 27  GLUCOSE 90  BUN 12  CREATININE 0.96  CALCIUM 8.7*   No results for input(s): LABURIN in the last 72 hours. Results for orders placed  or performed during the hospital encounter of 03/07/19  SARS CORONAVIRUS 2 (TAT 6-24 HRS) Nasopharyngeal Nasopharyngeal Swab     Status: None   Collection Time: 03/07/19 10:01 AM   Specimen: Nasopharyngeal Swab  Result Value Ref Range Status   SARS Coronavirus 2 NEGATIVE NEGATIVE Final    Comment: (NOTE) SARS-CoV-2 target nucleic acids are NOT DETECTED. The SARS-CoV-2 RNA is generally detectable in upper and lower respiratory specimens during the acute phase of infection. Negative results do not preclude SARS-CoV-2 infection, do not rule out co-infections with other pathogens, and should not be used as the sole basis for treatment or other patient management decisions. Negative results must be combined with clinical observations, patient history, and epidemiological information. The expected result is Negative. Fact Sheet for Patients: SugarRoll.be Fact Sheet for Healthcare Providers: https://www.woods-mathews.com/ This test is not yet approved or cleared by the [place] FDA and  has been authorized for detection and/or diagnosis of SARS-CoV-2 by FDA under an Emergency Use Authorization (EUA). This EUA will remain  in effect (meaning this test can be used) for the duration of the COVID-19 declaration under Section 56 4(b)(1) of the Act, 21 U.S.C. section 360bbb-3(b)(1), unless the authorization is terminated or revoked sooner. Performed at [facility], [place] [address], [place], [place] [address]     Imaging: No results found.

## 2019-03-12 NOTE — Transfer of Care (Signed)
Immediate Anesthesia Transfer of Care Note  Patient: Jonathan Lewis  Procedure(s) Performed: UPPER ESOPHAGEAL ENDOSCOPIC ULTRASOUND (EUS) (N/A ) ESOPHAGOGASTRODUODENOSCOPY (EGD) WITH PROPOFOL (N/A ) BIOPSY FINE NEEDLE ASPIRATION (FNA) LINEAR BALLOON DILATION (N/A )  Patient Location: PACU  Anesthesia Type:General  Level of Consciousness: awake, alert  and oriented  Airway & Oxygen Therapy: Patient Spontanous Breathing and Patient connected to face mask oxygen  Post-op Assessment: Report given to RN, Post -op Vital signs reviewed and stable and Patient moving all extremities X 4  Post vital signs: Reviewed and stable  Last Vitals:  Vitals Value Taken Time  BP    Temp    Pulse    Resp 9 03/12/19 1526  SpO2    Vitals shown include unvalidated device data.  Last Pain:  Vitals:   03/12/19 1227  TempSrc: Oral  PainSc: 0-No pain         Complications: No apparent anesthesia complications

## 2019-03-12 NOTE — Anesthesia Postprocedure Evaluation (Signed)
Anesthesia Post Note  Patient: Jonathan Lewis  Procedure(s) Performed: UPPER ESOPHAGEAL ENDOSCOPIC ULTRASOUND (EUS) (N/A ) ESOPHAGOGASTRODUODENOSCOPY (EGD) WITH PROPOFOL (N/A ) BIOPSY FINE NEEDLE ASPIRATION (FNA) LINEAR BALLOON DILATION (N/A )     Patient location during evaluation: Endoscopy Anesthesia Type: General Level of consciousness: awake and alert Pain management: pain level controlled Vital Signs Assessment: post-procedure vital signs reviewed and stable Respiratory status: spontaneous breathing, nonlabored ventilation and respiratory function stable Cardiovascular status: blood pressure returned to baseline and stable Postop Assessment: no apparent nausea or vomiting Anesthetic complications: no    Last Vitals:  Vitals:   03/12/19 1528 03/12/19 1530  BP: 134/61   Pulse:    Resp: 18 18  Temp: (!) 36.4 C   SpO2: 100%     Last Pain:  Vitals:   03/12/19 1528  TempSrc: Oral  PainSc: 0-No pain                 Lynda Rainwater

## 2019-03-12 NOTE — Progress Notes (Signed)
PROGRESS NOTE    Jonathan Lewis  [id number] DOB: [birth year] DOA: 03/07/2019 PCP: Biagio Borg, MD    Brief Narrative:  78 year old gentleman with prior history of GERD, hypertension, , BPH presents to ED with nausea vomiting.  CT of the abdomen shows gastric outlet obstruction and a small 2 inches 1.7 cm nodular focus of soft tissue density in the region of pylorus.  Was admitted to [facility] service for further evaluation and general surgery and gastroenterology consulted. Patient underwent EGD on 03/08/19 which shows reflux esophagitis, gastric stenosis at the level of pylorus with severe luminal narrowing to roughly 3 mm with normal nodularity extending into the duodenal bulb.  Biopsies were taken.  A large amount of residual food in the stomach which was removed.  Erythematous mucosa in the gastric body present. GI recommended clear liquids for now and monitor.  Continue with IV PPI for now.  Biopsies negative for cancer,. Plan for EGD and EUS today.    Assessment & Plan:   Active Problems:   Hypertension complicating diabetes ([place])   Gastric outlet obstruction   Malnutrition of moderate degree   Gastric outlet obstruction. Would explain patient's nausea and vomiting. Gastroenterology consulted and he underwent EGD on 12/4 showing reflux esophagitis, gastric stenosis at the level of pylorus with severe luminal narrowing to roughly 3 mm with normal nodularity extending into the duodenal bulb.  Biopsies were taken.  A large amount of residual food in the stomach which was removed.  Erythematous mucosa in the gastric body present. GI recommended clear liquids for now and monitor.  Continue with IV PPI and will wait for pathology results. General surgery waiting biopsies to guide surgical management. Biopsies negative for cancer. Repeat EGD and EUS today shows severe stenosis of the pylorus and further biopsies taken today. Further management as per GI.  Plan to continue clears and IV  fluids and check for biopsy results by Friday.      Malnutrition of moderate degree Appreciate dietary recommendations.    Hypertension Well controlled.   Hyperglycemia A1c is 5.9 CBG (last 3)  Recent Labs    03/12/19 1134 03/12/19 1550 03/12/19 1642  GLUCAP 96 124* 111*   Resume SSI.    Mild normocytic anemia Hemoglobin stable around 12.Marland Kitchen    GERD Stable,     DVT prophylaxis: scd's Code Status: full code.  Family Communication: none at bedside.  Disposition Plan: pending GI work up    Consultants:   Gastroenterology.   General surgery.   Procedures: EGD on 03/08/2019 EGD and EUS on 03/12/19    LA Grade D esophagitis with no bleeding. - A large amount of food (residue) in the stomach. Suctioned via endoscope. - Congested, erythematous, friable (with contact bleeding), granular, hemorrhagic appearing, texture changed and increased vascular pattern mucosa in the antrum. Biopsied. - Gastric stenosis was found in the prepyloric region of the stomach. Biopsied. Then Dilated. - No gross lesions in the first portion of the duodenum and in the second portion of the duodenum. EUS Impression: - Wall thickening was seen in the pylorus. The thickening appeared to be primarily within the deep mucosa (Layer 2) and submucosa (Layer 3). Some area of more significant hyperechogenicity was noted, not clearly a muscle layer tumor, but dark enough, decision made to obtain deeper tissue. Fine needle biopsy performed. - Pancreatic parenchymal abnormalities consisting of hyperechoic strands were noted in the genu of the pancreas, pancreatic body and pancreatic tail. Otherwise, normal pancreatic duct in visualized neck/body/tail. -  No malignant-appearing lymph nodes were visualized in the celiac region (level 20) but due to the amount of foodstuffs, this made it difficult for [place] to do a proper/complete EUS and so cannot say this is an adequate examination for all regions for  LN assessment.    Antimicrobials: none.   Subjective: Pt denies any chest pain or sob.  Nauseated today, no vomiting.   Objective: Vitals:   03/12/19 1530 03/12/19 1540 03/12/19 1550 03/12/19 1607  BP:   115/66 120/71  Pulse:    (!) 57  Resp: 18   19  Temp:    (!) 97.5 F (36.4 C)  TempSrc:    Oral  SpO2: 100% 100% 100% 100%  Weight:      Height:        Intake/Output Summary (Last 24 hours) at 03/12/2019 1700 Last data filed at 03/12/2019 1526 Gross per 24 hour  Intake 3002.5 ml  Output 2 ml  Net 3000.5 ml   Filed Weights   03/07/19 0934 03/07/19 1417  Weight: 79.4 kg 75.3 kg    Examination:  General exam: Alert and comfortable.  Respiratory system: Clear to auscultation, no wheezing or rhonchi.  Cardiovascular system: S1S2, RRR, no JVD.  Gastrointestinal system: abd is soft, non tender non distended. Bowl sound good.  Central nervous system:  Alert and non focal.  Extremities: no cyanosis.  Skin: No ulcers.  Psychiatry: Mood is appropriate.     Data Reviewed: I have personally reviewed following labs and imaging studies  CBC: Recent Labs  Lab 03/07/19 1000 03/08/19 0541 03/09/19 0528 03/11/19 0515  WBC 5.4 4.4 5.3 4.7  NEUTROABS 3.7  --   --   --   HGB 13.6 12.4* 11.4* 12.5*  HCT 42.7 38.3* 35.5* 38.8*  MCV 86.8 87.4 87.2 88.0  PLT 188 191 178 [id number]   Basic Metabolic Panel: Recent Labs  Lab 03/07/19 1000 03/08/19 0541 03/09/19 0528 03/11/19 0515  NA 135 140 140 139  K 4.3 3.9 3.8 3.9  CL 98 102 105 105  CO2 27 27 28 27   GLUCOSE 124* 101* 110* 90  BUN 18 18 15 12   CREATININE 1.11 1.04 0.93 0.96  CALCIUM 8.7* 8.7* 8.4* 8.7*   GFR: Estimated Creatinine Clearance: 60.4 mL/min (by C-G formula based on SCr of 0.96 mg/dL). Liver Function Tests: Recent Labs  Lab 03/07/19 1000 03/08/19 0541  AST 20 14*  ALT 15 13  ALKPHOS 44 40  BILITOT 1.3* 0.9  PROT 7.1 6.2*  ALBUMIN 4.0 3.4*   Recent Labs  Lab 03/07/19 1000  LIPASE 30   No  results for input(s): AMMONIA in the last 168 hours. Coagulation Profile: No results for input(s): INR, PROTIME in the last 168 hours. Cardiac Enzymes: No results for input(s): CKTOTAL, CKMB, CKMBINDEX, TROPONINI in the last 168 hours. BNP (last 3 results) No results for input(s): PROBNP in the last 8760 hours. HbA1C: No results for input(s): HGBA1C in the last 72 hours. CBG: Recent Labs  Lab 03/11/19 2100 03/12/19 0722 03/12/19 1134 03/12/19 1550 03/12/19 1642  GLUCAP 76 133* 96 124* 111*   Lipid Profile: No results for input(s): CHOL, HDL, LDLCALC, TRIG, CHOLHDL, LDLDIRECT in the last 72 hours. Thyroid Function Tests: No results for input(s): TSH, T4TOTAL, FREET4, T3FREE, THYROIDAB in the last 72 hours. Anemia Panel: No results for input(s): VITAMINB12, FOLATE, FERRITIN, TIBC, IRON, RETICCTPCT in the last 72 hours. Sepsis Labs: No results for input(s): PROCALCITON, LATICACIDVEN in the last 168 hours.  Recent Results (  from the past 240 hour(s))  SARS CORONAVIRUS 2 (TAT 6-24 HRS) Nasopharyngeal Nasopharyngeal Swab     Status: None   Collection Time: 03/07/19 10:01 AM   Specimen: Nasopharyngeal Swab  Result Value Ref Range Status   SARS Coronavirus 2 NEGATIVE NEGATIVE Final    Comment: (NOTE) SARS-CoV-2 target nucleic acids are NOT DETECTED. The SARS-CoV-2 RNA is generally detectable in upper and lower respiratory specimens during the acute phase of infection. Negative results do not preclude SARS-CoV-2 infection, do not rule out co-infections with other pathogens, and should not be used as the sole basis for treatment or other patient management decisions. Negative results must be combined with clinical observations, patient history, and epidemiological information. The expected result is Negative. Fact Sheet for Patients: SugarRoll.be Fact Sheet for Healthcare Providers: https://www.woods-mathews.com/ This test is not yet  approved or cleared by the [place] FDA and  has been authorized for detection and/or diagnosis of SARS-CoV-2 by FDA under an Emergency Use Authorization (EUA). This EUA will remain  in effect (meaning this test can be used) for the duration of the COVID-19 declaration under Section 56 4(b)(1) of the Act, 21 U.S.C. section 360bbb-3(b)(1), unless the authorization is terminated or revoked sooner. Performed at [facility], [place] [address] [place], [place] [address]          Radiology Studies: Dg C-arm 1-60 Min  Result Date: 03/12/2019 CLINICAL DATA:  Pyloric stenosis EXAM: DG C-ARM 1-60 MIN FLUOROSCOPY TIME:  Fluoroscopy Time:  2 minutes 17 seconds Number of Acquired Spot Images: 10 COMPARISON:  CT abdomen pelvis 03/06/2019, radiograph 03/07/2019 FINDINGS: Sequential fluoroscopic images demonstrate cannulation of the pylorus and subsequent balloon dilatation. Some retained contrast media is noted within several bowel loops. There is mild gaseous distention of the bowel as well. IMPRESSION: Successful cannulation of the gland dilatation of the pylorus. Please refer to dedicated operative report for further findings. Electronically Signed   By: Lovena Le M.D.   On: 03/12/2019 15:54        Scheduled Meds: . feeding supplement  1 Container Oral TID BM  . feeding supplement (PRO-STAT SUGAR FREE 64)  30 mL Oral TID BM  . insulin aspart  0-9 Units Subcutaneous TID WC  . multivitamin  15 mL Per Tube Daily  . pantoprazole (PROTONIX) IV  40 mg Intravenous Q12H   Continuous Infusions: . sodium chloride 20 mL/hr at 03/07/19 1259  . sodium chloride 100 mL/hr at 03/10/19 1100  . dextrose 5 % and 0.9% NaCl 75 mL/hr at 03/12/19 1630     LOS: 5 days        Hosie Poisson, MD [facility]  03/12/2019, 5:00 PM

## 2019-03-12 NOTE — Anesthesia Procedure Notes (Signed)
Procedure Name: Intubation Date/Time: 03/12/2019 1:20 PM Performed by: Niel Hummer, CRNA Pre-anesthesia Checklist: Patient identified, Emergency Drugs available, Suction available and Patient being monitored Patient Re-evaluated:Patient Re-evaluated prior to induction Oxygen Delivery Method: Circle system utilized Preoxygenation: Pre-oxygenation with 100% oxygen Induction Type: IV induction, Rapid sequence and Cricoid Pressure applied Laryngoscope Size: Mac and 4 Grade View: Grade II Tube type: Oral Tube size: 7.5 mm Number of attempts: 1 Airway Equipment and Method: Stylet Placement Confirmation: ETT inserted through vocal cords under direct vision,  positive ETCO2 and breath sounds checked- equal and bilateral Secured at: 22 cm Tube secured with: Tape Dental Injury: Teeth and Oropharynx as per pre-operative assessment

## 2019-03-12 NOTE — Op Note (Signed)
[facility] Patient Name: Jonathan Lewis Procedure Date: 03/12/2019 MRN: [id number] Attending MD: Justice Britain , MD Date of Birth: 09-07-1940 CSN: [id number] Age: 78 Admit Type: Inpatient Procedure:                Upper EUS Indications:              Gastric mucosal mass/polyp found on endoscopy,                            Suspected mass in stomach on CT scan, Suspected                            gastric neoplasm, Pyloric stenosis, For therapy of                            pyloric stenosis Providers:                Justice Britain, MD, Elmer Ramp. Tilden Dome, RN, Cletis Athens, Technician Referring MD:             [facility] Medicines:                Monitored Anesthesia Care was converted while                            intraprocedure to General Anesthesia, Cipro 400 mg                            IV Complications:            No immediate complications. Estimated Blood Loss:     Estimated blood loss was minimal. Procedure:                Pre-Anesthesia Assessment:                           - Prior to the procedure, a History and Physical                            was performed, and patient medications and                            allergies were reviewed. The patient's tolerance of                            previous anesthesia was also reviewed. The risks                            and benefits of the procedure and the sedation                            options and risks were discussed with the patient.                            All questions were  answered, and informed consent                            was obtained. Prior Anticoagulants: The patient has                            taken no previous anticoagulant or antiplatelet                            agents. ASA Grade Assessment: III - A patient with                            severe systemic disease. After reviewing the risks                            and benefits, the  patient was deemed in                            satisfactory condition to undergo the procedure.                           After obtaining informed consent, the endoscope was                            passed under direct vision. Throughout the                            procedure, the patient's blood pressure, pulse, and                            oxygen saturations were monitored continuously. The                            upper EUS was accomplished without difficulty. The                            patient tolerated the procedure. The [id number]-[id number]                            ([id number]) Olympus ultra slim endoscope was                            introduced through the mouth, and advanced to the                            antrum of the stomach. The [id number] ([id number])                            Olympus therapeutic endoscope was introduced                            through the mouth, and advanced to the antrum of  the stomach. The [id number]-[id number] ([id number]) Olympus ultra                            slim endoscope was introduced through the mouth,                            and advanced to the second part of duodenum. The                            [id number] ([id number]) Olympus Radial EUS was                            introduced through the mouth, and advanced to the                            stomach for ultrasound examination. Scope In: Scope Out: Findings:      ENDOSCOPIC FINDING: :      LA Grade D (one or more mucosal breaks involving at least 75% of       esophageal circumference) esophagitis with no bleeding was found in the       mid esophagus.      A large amount of food (residue) was found in the entire examined       stomach. Suction via Endoscope was performed and yielded >750 cc of       fluid/debris.      Diffuse severe mucosal changes characterized by congestion, erythema,       friability (with contact bleeding), granularity, hemorrhagic appearance,        altered texture and an increased vascular pattern were found in the       gastric antrum. Biopsies were taken with a cold forceps for histology -       query continued stasis changes vs underlying ischemic changes.      A malignant-appearing, intrinsic severe stenosis was found in the       prepyloric region of the stomach. This was not traversed with the       therapeutic EGD scope or the Ultraslim Colonoscope (previously had been       able to), only the Neonatal EGD scope was able to initially traverse the       region into the bulb. Biopsies were taken with a cold forceps for       histology. After the EUS was completed as below, attempt at dilation was       performed. Using the Neonatal EGD scope, I placed a long 450 cm, 0.035       inch Soft Jagwire deep into the intestine. Using a tome as a guide, I       backloaded the wire onto a therapeutic EGD scope. I then used a TTS       dilator, over the wire that was then passed through the scope. Dilation       with a 6-7-8 mm and an 11-10-08 mm pyloric balloon dilator was performed       under fluoroscopic guidance was performed. I was able after dilating to       10 mm to allow the therapeutic EGD scope to pass the area with mild       resistance. Mucosal wrenting was noted, so no further dilation was  pursued today.      No gross lesions were noted in the first portion of the duodenum and in       the second portion of the duodenum.      ENDOSONOGRAPHIC FINDING: :      Localized wall thickening was visualized endosonographically in the       pylorus. This appeared to primarily be due to thickening within the deep       mucosa (Layer 2) and submucosa (Layer 3). The irregular gastric wall       measured up to 13.9 mm and the total gastric wall in this region       measured up to 15 mm in thickness. In some views, a potential more       hypoechoic region in the pylorus was noted that measured 23 mm by 15 mm.       This was not  arising from Layer 4 as if it is a Spindle Cell tumor       causing impingement of the area, but it was darker than the other       thickened folds. Decision made to pursue deeper tissue and fine needle       biopsy was performed. Color Doppler imaging was utilized prior to needle       puncture to confirm a lack of significant vascular structures within the       needle path. Four passes were made with the 22 gauge ultrasound core       biopsy needle using a transgastric approach. Visible cores of tissue       were obtained. Preliminary cytologic examination and touch preps were       performed. Final cytology results are pending.      Pancreatic parenchymal abnormalities were noted in the genu of the       pancreas, pancreatic body and pancreatic tail. These consisted of       hyperechoic strands. There was no significant dilation in the visualized       Pancreatic duct in the Neck/Body/Tail.      No malignant-appearing lymph nodes were visualized in the celiac region       (level 20) but this was overall difficult to pursue due to the amount of       foodstuffs in the stomach, so an adequate LN assessment was not possible       today.      The celiac region was visualized. Impression:               EGD Impression:                           - LA Grade D esophagitis with no bleeding.                           - A large amount of food (residue) in the stomach.                            Suctioned via endoscope.                           - Congested, erythematous, friable (with contact  bleeding), granular, hemorrhagic appearing, texture                            changed and increased vascular pattern mucosa in                            the antrum. Biopsied.                           - Gastric stenosis was found in the prepyloric                            region of the stomach. Biopsied. Then Dilated.                           - No gross lesions in the first  portion of the                            duodenum and in the second portion of the duodenum.                           EUS Impression:                           - Wall thickening was seen in the pylorus. The                            thickening appeared to be primarily within the deep                            mucosa (Layer 2) and submucosa (Layer 3). Some area                            of more significant hyperechogenicity was noted,                            not clearly a muscle layer tumor, but dark enough,                            decision made to obtain deeper tissue. Fine needle                            biopsy performed.                           - Pancreatic parenchymal abnormalities consisting                            of hyperechoic strands were noted in the genu of                            the pancreas, pancreatic body and pancreatic tail.  Otherwise, normal pancreatic duct in visualized                            neck/body/tail.                           - No malignant-appearing lymph nodes were                            visualized in the celiac region (level 20) but due                            to the amount of foodstuffs, this made it difficult                            for [place] to do a proper/complete EUS and so cannot                            say this is an adequate examination for all regions                            for LN assessment. Moderate Sedation:      Not Applicable - Patient had care per Anesthesia. Recommendation:           - The patient will be observed post-procedure,                            until all discharge criteria are met.                           - Return patient to hospital ward for ongoing care.                           - Clear liquid diet.                           - Observe patient's clinical course.                           - Await cytology results and await path results.                           -  Repeat the upper endoscopic ultrasound in 3 days                            for retreatment.                           - If the biopsies return negative for malignancy,                            then may consider repeat dilations in effort of                            trying to  enlarge the region, however, I have                            concern that endoscopic therapies will not be                            enough. So appreciate the surgical service still                            being present and available should                            gastrojejunostomy be required for gastric diversion.                           - The findings and recommendations were discussed                            with the patient.                           - The findings and recommendations were discussed                            with the patient's family.                           - The findings and recommendations were discussed                            with the referring physician. Procedure Code(s):        --- Professional ---                           [phone], 59,51, Esophagogastroduodenoscopy, flexible,                            transoral; with transendoscopic ultrasound-guided                            intramural or transmural fine needle                            aspiration/biopsy(s), (includes endoscopic                            ultrasound examination limited to the esophagus,                            stomach or duodenum, and adjacent structures)                           43245, Esophagogastroduodenoscopy, flexible,                            transoral; with dilation of gastric/duodenal  stricture(s) (eg, balloon, bougie) Diagnosis Code(s):        --- Professional ---                           K20.90, Esophagitis, unspecified without bleeding                           K92.2, Gastrointestinal hemorrhage, unspecified                           K31.89, Other  diseases of stomach and duodenum                           K31.1, Adult hypertrophic pyloric stenosis                           K86.9, Disease of pancreas, unspecified                           I89.9, Noninfective disorder of lymphatic vessels                            and lymph nodes, unspecified                           R93.3, Abnormal findings on diagnostic imaging of                            other parts of digestive tract CPT copyright 2019 American Medical Association. All rights reserved. The codes documented in this report are preliminary and upon coder review may  be revised to meet current compliance requirements. Justice Britain, MD 03/12/2019 6:01:39 PM Number of Addenda: 0

## 2019-03-12 NOTE — Anesthesia Preprocedure Evaluation (Signed)
Anesthesia Evaluation  Patient identified by MRN, date of birth, ID band Patient awake    Reviewed: Allergy & Precautions, NPO status , Patient's Chart, lab work & pertinent test results  Airway Mallampati: II  TM Distance: >3 FB Neck ROM: Full    Dental  (+) Teeth Intact   Pulmonary former smoker,    Pulmonary exam normal        Cardiovascular hypertension, Pt. on medications Normal cardiovascular exam     Neuro/Psych negative psych ROS   GI/Hepatic Neg liver ROS, GERD  Medicated,Gastric outlet obstruction   Endo/Other  diabetes, Type 2  Renal/GU negative Renal ROS     Musculoskeletal  (+) Arthritis ,   Abdominal   Peds  Hematology negative hematology ROS (+)   Anesthesia Other Findings   Reproductive/Obstetrics                             Anesthesia Physical  Anesthesia Plan  ASA: III  Anesthesia Plan: MAC   Post-op Pain Management:    Induction: Intravenous  PONV Risk Score and Plan: 1 and Propofol infusion, TIVA and Treatment may vary due to age or medical condition  Airway Management Planned: Nasal Cannula and Natural Airway  Additional Equipment: None  Intra-op Plan:   Post-operative Plan:   Informed Consent: I have reviewed the patients History and Physical, chart, labs and discussed the procedure including the risks, benefits and alternatives for the proposed anesthesia with the patient or authorized representative who has indicated his/her understanding and acceptance.       Plan Discussed with:   Anesthesia Plan Comments:         Anesthesia Quick Evaluation

## 2019-03-13 ENCOUNTER — Other Ambulatory Visit: Payer: Self-pay

## 2019-03-13 LAB — CBC
HCT: 34.9 % — ABNORMAL LOW (ref 39.0–52.0)
Hemoglobin: 11 g/dL — ABNORMAL LOW (ref 13.0–17.0)
MCH: 27.8 pg (ref 26.0–34.0)
MCHC: 31.5 g/dL (ref 30.0–36.0)
MCV: 88.4 fL (ref 80.0–100.0)
Platelets: 174 10*3/uL (ref 150–400)
RBC: 3.95 MIL/uL — ABNORMAL LOW (ref 4.22–5.81)
RDW: 12.2 % (ref 11.5–15.5)
WBC: 3.6 10*3/uL — ABNORMAL LOW (ref 4.0–10.5)
nRBC: 0 % (ref 0.0–0.2)

## 2019-03-13 LAB — BASIC METABOLIC PANEL
Anion gap: 7 (ref 5–15)
BUN: 9 mg/dL (ref 8–23)
CO2: 24 mmol/L (ref 22–32)
Calcium: 8.2 mg/dL — ABNORMAL LOW (ref 8.9–10.3)
Chloride: 109 mmol/L (ref 98–111)
Creatinine, Ser: 0.94 mg/dL (ref 0.61–1.24)
GFR calc Af Amer: >60 mL/min (ref >60)
GFR calc non Af Amer: >60 mL/min (ref >60)
Glucose, Bld: 115 mg/dL — ABNORMAL HIGH (ref 70–99)
Potassium: 3.7 mmol/L (ref 3.5–5.1)
Sodium: 140 mmol/L (ref 135–145)

## 2019-03-13 LAB — GLUCOSE, CAPILLARY
Glucose-Capillary: 103 mg/dL — ABNORMAL HIGH (ref 70–99)
Glucose-Capillary: 87 mg/dL (ref 70–99)
Glucose-Capillary: 78 mg/dL (ref 70–99)
Glucose-Capillary: 81 mg/dL (ref 70–99)

## 2019-03-13 MED ORDER — GABAPENTIN 250 MG/5ML PO SOLN
100.0000 mg | Freq: Two times a day (BID) | ORAL | Status: DC
  Administered 2019-03-13 – 2019-03-18 (×10): 100 mg via ORAL
  Filled 2019-03-13 (×13): qty 2

## 2019-03-13 MED ORDER — FAMOTIDINE 40 MG/5ML PO SUSR
20.0000 mg | Freq: Two times a day (BID) | ORAL | Status: DC
  Administered 2019-03-13 – 2019-03-14 (×4): 20 mg via ORAL
  Filled 2019-03-13 (×5): qty 2.5

## 2019-03-13 NOTE — Progress Notes (Signed)
[name] - Progress Note  SHADEN HAUGHT [id number] DOB: Feb 06, 1941 DOA: 03/07/2019 PCP: Biagio Borg, MD   Brief narrative: HPI: Jonathan Lewis is a 78 y.o. male with medical history significant of GERD, hypertension, hyperglycemia not on any diabetic medication, acoustic neuroma, BPH. Admitted with complaints of persistent nausea vomiting and burping for the last 1 month. Patient had a televisit with his PCP on 1127 for hypoglycemia his blood sugar was 51-75 at home.  In addition he reported decreased appetite and lost 20 pounds in the last 1 month.  Denied hematochezia, melena, or hematemesis.  Due to significant reflux symptoms, burping and epigastric abdominal pain radiating up into the esophagus he presented to the ER for treatment.  Despite taking his usual Protonix and Pepcid his symptoms had not improved. He underwent CT scan of the abdomen (ordered by his PCP) 2 days prior to admission was ordered that showed gastric outlet obstruction from a possible mass.     ED Course:  CT of the abdomen and pelvis 03/06/2019 was reviewed: Marked distention of the stomach which is filled with food, fluid, and contrast material. This appearance is compatible with gastric outlet obstruction or severe dysmotility. There was a 2.0 x 1.7 cm nodular focus of soft tissue density in the region of the pylorus and while this area is difficult to assess by CT, features certainly raise concern for mass lesion with secondary gastric outlet obstruction.  No evidence for lymphadenopathy or metastatic disease in the abdomen/pelvis.Initial white count unremarkable.  Electrolyte panel unremarkable including normal renal function.  Subjective: Reports marked decrease in postprandial discomfort and no sensation of bloating that was present yesterday prior to EGD with dilatation.  Assessment/Plan: Active Problems: Gastric outlet obstruction secondary to severe erosive esophagitis with stricture-post EGD  with dilatation on 12/8 Presented as above Initial EGD on 12/4 revealed reflux esophagitis with gastric stenosis at the level of the pylorus and severe luminal narrowing stenting to at the duodenal bulb.  A large amount of retained food and fluid were noted and were evacuated during procedure He underwent a second EGD with dilatation on 12/8 that revealed: -LA Grade D esophagitis with no bleeding. - A large amount of food (residue) in the stomach. Suctioned via endoscope. - Congested, erythematous, friable (with contact bleeding), granular, hemorrhagic appearing, texture changed and increased vascular pattern mucosa in the antrum. Biopsied. - Gastric stenosis was found in the prepyloric region of the stomach. Biopsied. Then Dilated. - No gross lesions in the first portion of the duodenum and in the second portion of the duodenum. EUS Impression: - Wall thickening was seen in the pylorus. The thickening appeared to be primarily within the deep mucosa (Layer 2) and submucosa (Layer 3). Some area of more significant hyperechogenicity was noted, not clearly a muscle layer tumor, Recommendations were to continue clear liquid diet, IV PPI and H2 blocker Concerns for possible malignancy so biopsies were also obtained and are pending Patient reports tolerating clear liquids better today without reflux symptoms or abdominal pain-advance diet at discretion of gastroenterology team The surgical team has signed off as of 12/8 Follow-up on most recent EGD cytology and pathology    Hypertension  Blood pressure currently controlled with as needed IV hydralazine Continue to hold home lisinopril  Diabetes mellitus 2 with hyperglycemia/peripheral neuropathy Diet controlled at home Patient reported 20 pound weight loss prior to admission which is also likely influencing glycemic control for the better CBG has been less than 120  since admission I have resumed Neurontin but have changed to elixir  formulation Continue to follow CBG and provide SSI    Malnutrition of moderate degree Estimated body mass index is 25.24 kg/m as calculated from the following:   Height as of this encounter: 5\' 8"  (1.727 m).   Weight as of this encounter: 75.3 kg.  Malnutrition Type:  Nutrition Problem: Moderate Malnutrition Etiology: acute illness Malnutrition Characteristics:  Signs/Symptoms: percent weight loss, moderate muscle depletion, mild fat depletion Percent weight loss: 9.8 % Nutrition Interventions:  Interventions: Prostat, Boost Breeze  Dyslipidemia Not on pharmacological treatment prior to admission    DVT prophylaxis: SCDs  Code Status:  Full  Family Communication:  Patient only  Disposition Plan/Expected LOS: Inpatient, patient presents with severe esophagitis with obstructive physiology and has recently undergone dilatation of same.  Currently on clear liquid diet as well as IV fluids but is yet to prove tolerance to diet advanced.  Also awaiting pathology results in the event there is malignant etiology and may need to have surgery reconsulted prior to discharge  Consultants: General surgery-signed off 12/8 Gastroenterology  Procedures: 12/4 EGD 12/8 EGD with dilatation  Cultures: SARS-CoV-2 negative  Antibiotics: Anti-infectives (From admission, onward)   None      Objective: Blood pressure 105/71, pulse 62, temperature 98.2 F (36.8 C), temperature source Oral, resp. rate 18, height 5\' 8"  (1.727 m), weight 75.3 kg, SpO2 100 %.  Intake/Output Summary (Last 24 hours) at 03/13/2019 1241 Last data filed at 03/13/2019 0241 Gross per 24 hour  Intake 2345 ml  Output 1126 ml  Net 1219 ml     Exam: Gen: No acute respiratory distress Chest: Clear to auscultation bilaterally without wheezes, rhonchi or crackles, room air Cardiac: Regular rate and rhythm, S1-S2, no rubs murmurs or gallops, no peripheral edema, no JVD Abdomen: Soft nontender nondistended  without obvious hepatosplenomegaly, no ascites Extremities: Symmetrical in appearance without cyanosis, clubbing or effusion  Scheduled Meds:  Scheduled Meds: . famotidine  20 mg Oral BID  . feeding supplement  1 Container Oral TID BM  . feeding supplement (PRO-STAT SUGAR FREE 64)  30 mL Oral TID BM  . gabapentin  100 mg Oral Q12H  . insulin aspart  0-9 Units Subcutaneous TID WC  . multivitamin  15 mL Per Tube Daily  . pantoprazole (PROTONIX) IV  40 mg Intravenous Q12H   Continuous Infusions: . sodium chloride Stopped (03/12/19 1900)  . sodium chloride 100 mL/hr at 03/10/19 1100  . dextrose 5 % and 0.9% NaCl 75 mL/hr at 03/12/19 1630    Data Reviewed: Basic Metabolic Panel: Recent Labs  Lab 03/07/19 1000 03/08/19 0541 03/09/19 0528 03/11/19 0515 03/13/19 0823  NA 135 140 140 139 140  K 4.3 3.9 3.8 3.9 3.7  CL 98 102 105 105 109  CO2 27 27 28 27 24   GLUCOSE 124* 101* 110* 90 115*  BUN 18 18 15 12 9   CREATININE 1.11 1.04 0.93 0.96 0.94  CALCIUM 8.7* 8.7* 8.4* 8.7* 8.2*   Liver Function Tests: Recent Labs  Lab 03/07/19 1000 03/08/19 0541  AST 20 14*  ALT 15 13  ALKPHOS 44 40  BILITOT 1.3* 0.9  PROT 7.1 6.2*  ALBUMIN 4.0 3.4*   Recent Labs  Lab 03/07/19 1000  LIPASE 30   No results for input(s): AMMONIA in the last 168 hours. CBC: Recent Labs  Lab 03/07/19 1000 03/08/19 0541 03/09/19 0528 03/11/19 0515 03/13/19 0823  WBC 5.4 4.4 5.3 4.7 3.6*  NEUTROABS  3.7  --   --   --   --   HGB 13.6 12.4* 11.4* 12.5* 11.0*  HCT 42.7 38.3* 35.5* 38.8* 34.9*  MCV 86.8 87.4 87.2 88.0 88.4  PLT 188 191 178 192 174   Cardiac Enzymes: No results for input(s): CKTOTAL, CKMB, CKMBINDEX, TROPONINI in the last 168 hours. BNP (last 3 results) No results for input(s): BNP in the last 8760 hours.  ProBNP (last 3 results) No results for input(s): PROBNP in the last 8760 hours.  CBG: Recent Labs  Lab 03/12/19 1550 03/12/19 1642 03/12/19 2027 03/13/19 0746 03/13/19  1157  GLUCAP 124* 111* 94 103* 87    Recent Results (from the past 240 hour(s))  SARS CORONAVIRUS 2 (TAT 6-24 HRS) Nasopharyngeal Nasopharyngeal Swab     Status: None   Collection Time: 03/07/19 10:01 AM   Specimen: Nasopharyngeal Swab  Result Value Ref Range Status   SARS Coronavirus 2 NEGATIVE NEGATIVE Final    Comment: (NOTE) SARS-CoV-2 target nucleic acids are NOT DETECTED. The SARS-CoV-2 RNA is generally detectable in upper and lower respiratory specimens during the acute phase of infection. Negative results do not preclude SARS-CoV-2 infection, do not rule out co-infections with other pathogens, and should not be used as the sole basis for treatment or other patient management decisions. Negative results must be combined with clinical observations, patient history, and epidemiological information. The expected result is Negative. Fact Sheet for Patients: SugarRoll.be Fact Sheet for Healthcare Providers: https://www.woods-mathews.com/ This test is not yet approved or cleared by the [place] FDA and  has been authorized for detection and/or diagnosis of SARS-CoV-2 by FDA under an Emergency Use Authorization (EUA). This EUA will remain  in effect (meaning this test can be used) for the duration of the COVID-19 declaration under Section 56 4(b)(1) of the Act, 21 U.S.C. section 360bbb-3(b)(1), unless the authorization is terminated or revoked sooner. Performed at [facility], [place] [address] [place], [place] [address]        Studies:  Recent x-ray studies have been reviewed in detail by the Attending Physician  Time spent :      Erin Hearing, [place] [facility] Office  [phone] Pager [phone]   **If unable to reach the above provider after paging please contact the [facility] @ [phone]  On-Call/Text Page:      [email or web address]      password [email or web address]  If 7PM-7AM, please contact night-coverage  [email or web address] Password [email or web address] 03/13/2019, 12:41 PM   LOS: 6 days

## 2019-03-14 ENCOUNTER — Inpatient Hospital Stay (HOSPITAL_COMMUNITY): Payer: Medicare Other | Admitting: Anesthesiology

## 2019-03-14 ENCOUNTER — Encounter (HOSPITAL_COMMUNITY): Payer: Self-pay | Admitting: Internal Medicine

## 2019-03-14 ENCOUNTER — Encounter: Payer: Self-pay | Admitting: Gastroenterology

## 2019-03-14 LAB — COMPREHENSIVE METABOLIC PANEL
ALT: 11 U/L (ref 0–44)
AST: 15 U/L (ref 15–41)
Albumin: 3.1 g/dL — ABNORMAL LOW (ref 3.5–5.0)
Alkaline Phosphatase: 36 U/L — ABNORMAL LOW (ref 38–126)
Anion gap: 5 (ref 5–15)
BUN: 10 mg/dL (ref 8–23)
CO2: 26 mmol/L (ref 22–32)
Calcium: 8.2 mg/dL — ABNORMAL LOW (ref 8.9–10.3)
Chloride: 109 mmol/L (ref 98–111)
Creatinine, Ser: 0.89 mg/dL (ref 0.61–1.24)
GFR calc Af Amer: >60 mL/min (ref >60)
GFR calc non Af Amer: >60 mL/min (ref >60)
Glucose, Bld: 114 mg/dL — ABNORMAL HIGH (ref 70–99)
Potassium: 3.7 mmol/L (ref 3.5–5.1)
Sodium: 140 mmol/L (ref 135–145)
Total Bilirubin: 0.7 mg/dL (ref 0.3–1.2)
Total Protein: 5.6 g/dL — ABNORMAL LOW (ref 6.5–8.1)

## 2019-03-14 LAB — GLUCOSE, CAPILLARY
Glucose-Capillary: 111 mg/dL — ABNORMAL HIGH (ref 70–99)
Glucose-Capillary: 131 mg/dL — ABNORMAL HIGH (ref 70–99)
Glucose-Capillary: 77 mg/dL (ref 70–99)
Glucose-Capillary: 109 mg/dL — ABNORMAL HIGH (ref 70–99)

## 2019-03-14 LAB — CBC
HCT: 34.7 % — ABNORMAL LOW (ref 39.0–52.0)
Hemoglobin: 11.1 g/dL — ABNORMAL LOW (ref 13.0–17.0)
MCH: 28.2 pg (ref 26.0–34.0)
MCHC: 32 g/dL (ref 30.0–36.0)
MCV: 88.3 fL (ref 80.0–100.0)
Platelets: 172 10*3/uL (ref 150–400)
RBC: 3.93 MIL/uL — ABNORMAL LOW (ref 4.22–5.81)
RDW: 12.4 % (ref 11.5–15.5)
WBC: 3.8 10*3/uL — ABNORMAL LOW (ref 4.0–10.5)
nRBC: 0 % (ref 0.0–0.2)

## 2019-03-14 LAB — CYTOLOGY - NON PAP

## 2019-03-14 MED ORDER — MAGNESIUM HYDROXIDE 400 MG/5ML PO SUSP
30.0000 mL | Freq: Every day | ORAL | Status: DC | PRN
  Administered 2019-03-14: 30 mL via ORAL
  Filled 2019-03-14: qty 30

## 2019-03-14 NOTE — Progress Notes (Signed)
[name] - Progress Note  BORYS MAROLDA [id number] DOB: [birth year] DOA: 03/07/2019 PCP: Biagio Borg, MD   Brief narrative: HPI: Jonathan Lewis is a 78 y.o. male with medical history significant of GERD, hypertension, hyperglycemia not on any diabetic medication, acoustic neuroma, BPH. Admitted with complaints of persistent nausea vomiting and burping for the last 1 month. Patient had a televisit with his PCP on 1127 for hypoglycemia his blood sugar was 51-75 at home.  In addition he reported decreased appetite and lost 20 pounds in the last 1 month.  Denied hematochezia, melena, or hematemesis.  Due to significant reflux symptoms, burping and epigastric abdominal pain radiating up into the esophagus he presented to the ER for treatment.  Despite taking his usual Protonix and Pepcid his symptoms had not improved. He underwent CT scan of the abdomen (ordered by his PCP) 2 days prior to admission was ordered that showed gastric outlet obstruction from a possible mass.     ED Course:  CT of the abdomen and pelvis 03/06/2019 was reviewed: Marked distention of the stomach which is filled with food, fluid, and contrast material. This appearance is compatible with gastric outlet obstruction or severe dysmotility. There was a 2.0 x 1.7 cm nodular focus of soft tissue density in the region of the pylorus and while this area is difficult to assess by CT, features certainly raise concern for mass lesion with secondary gastric outlet obstruction.  No evidence for lymphadenopathy or metastatic disease in the abdomen/pelvis.Initial white count unremarkable.  Electrolyte panel unremarkable including normal renal function.  Subjective: Patient seen early this afternoon, right after lunch. Denied epigastric pain though he had a "buldge" at the epigastric area earlier but this had resolved by the time I saw him. Denied nausea or vomiting.   Assessment/Plan: Active Problems: Gastric outlet  obstruction secondary to severe erosive esophagitis with stricture-post EGD with dilatation on 12/8 Presented as above Initial EGD on 12/4 revealed reflux esophagitis with gastric stenosis at the level of the pylorus and severe luminal narrowing stenting to at the duodenal bulb.  A large amount of retained food and fluid were noted and were evacuated during procedure He underwent a second EGD with dilatation on 12/8 that revealed: -LA Grade D esophagitis with no bleeding. - A large amount of food (residue) in the stomach. Suctioned via endoscope. - Congested, erythematous, friable (with contact bleeding), granular, hemorrhagic appearing, texture changed and increased vascular pattern mucosa in the antrum. Biopsied. - Gastric stenosis was found in the prepyloric region of the stomach. Biopsied. Then Dilated. - No gross lesions in the first portion of the duodenum and in the second portion of the duodenum. EUS Impression: - Wall thickening was seen in the pylorus. The thickening appeared to be primarily within the deep mucosa (Layer 2) and submucosa (Layer 3). Some area of more significant hyperechogenicity was noted, not clearly a muscle layer tumor, Recommendations were to continue clear liquid diet, IV PPI and H2 blocker Concerns for possible malignancy so biopsies were also obtained and final report pending--GI to discuss result with the patient.  Patient reports tolerating clear liquids.  Advance diet at discretion of gastroenterology team.  The surgical team has signed off as of 12/8 but will be contacted again pending final path report.       Hypertension  Blood pressure currently controlled with as needed IV hydralazine Continue to hold home lisinopril.  Diabetes mellitus 2 with hyperglycemia/peripheral neuropathy Diet controlled at home Patient reported  20 pound weight loss prior to admission which is also likely influencing glycemic control  CBG has been less than 120 since  admission Continue gabapentin elixir Continue to follow CBG and provide SSI    Malnutrition of moderate degree Estimated body mass index is 25.24 kg/m as calculated from the following:   Height as of this encounter: 5\' 8"  (1.727 m).   Weight as of this encounter: 75.3 kg.  Malnutrition Type:  Nutrition Problem: Moderate Malnutrition Etiology: acute illness Malnutrition Characteristics:  Signs/Symptoms: percent weight loss, moderate muscle depletion, mild fat depletion Percent weight loss: 9.8 % Nutrition Interventions:  Interventions: Prostat, Boost Breeze  Dyslipidemia Not on pharmacological treatment prior to admission    DVT prophylaxis: SCDs  Code Status:  Full  Family Communication:  Patient only  Disposition Plan/Expected LOS: Inpatient, patient presents with severe esophagitis with obstructive physiology and has recently undergone dilatation of same.  Currently on clear liquid diet as well as IV fluids. Pathology from recent biopsies with final report pending.      Consultants: General surgery-signed off 12/8 Gastroenterology  Procedures: 12/4 EGD 12/8 EGD with dilatation  Cultures: SARS-CoV-2 negative  Antibiotics: Anti-infectives (From admission, onward)   None      Objective: Blood pressure 124/89, pulse (!) 55, temperature 98.4 F (36.9 C), temperature source Oral, resp. rate 20, height 5\' 8"  (1.727 m), weight 75.3 kg, SpO2 100 %.  Intake/Output Summary (Last 24 hours) at 03/14/2019 1647 Last data filed at 03/14/2019 1400 Gross per 24 hour  Intake 1820 ml  Output 2 ml  Net 1818 ml     Exam: Gen: No acute respiratory distress Chest: No respiratory distress, no wheezing.  Cardiac: Regular rate and rhythm, S1-S2 Abdomen: Soft nontender nondistended, no ascites Extremities: Symmetrical in appearance without cyanosis, clubbing or effusion  Scheduled Meds:  Scheduled Meds: . famotidine  20 mg Oral BID  . feeding supplement  1  Container Oral TID BM  . feeding supplement (PRO-STAT SUGAR FREE 64)  30 mL Oral TID BM  . gabapentin  100 mg Oral Q12H  . insulin aspart  0-9 Units Subcutaneous TID WC  . multivitamin  15 mL Per Tube Daily  . pantoprazole (PROTONIX) IV  40 mg Intravenous Q12H   Continuous Infusions: . sodium chloride Stopped (03/12/19 1900)  . sodium chloride 100 mL/hr at 03/14/19 1400  . dextrose 5 % and 0.9% NaCl 75 mL/hr at 03/14/19 1000    Data Reviewed: Basic Metabolic Panel: Recent Labs  Lab 03/08/19 0541 03/09/19 0528 03/11/19 0515 03/13/19 0823 03/14/19 0555  NA 140 140 139 140 140  K 3.9 3.8 3.9 3.7 3.7  CL 102 105 105 109 109  CO2 27 28 27 24 26   GLUCOSE 101* 110* 90 115* 114*  BUN 18 15 12 9 10   CREATININE 1.04 0.93 0.96 0.94 0.89  CALCIUM 8.7* 8.4* 8.7* 8.2* 8.2*   Liver Function Tests: Recent Labs  Lab 03/08/19 0541 03/14/19 0555  AST 14* 15  ALT 13 11  ALKPHOS 40 36*  BILITOT 0.9 0.7  PROT 6.2* 5.6*  ALBUMIN 3.4* 3.1*   No results for input(s): LIPASE, AMYLASE in the last 168 hours. No results for input(s): AMMONIA in the last 168 hours. CBC: Recent Labs  Lab 03/08/19 0541 03/09/19 0528 03/11/19 0515 03/13/19 0823 03/14/19 0555  WBC 4.4 5.3 4.7 3.6* 3.8*  HGB 12.4* 11.4* 12.5* 11.0* 11.1*  HCT 38.3* 35.5* 38.8* 34.9* 34.7*  MCV 87.4 87.2 88.0 88.4 88.3  PLT 191  178 192 174 172   Cardiac Enzymes: No results for input(s): CKTOTAL, CKMB, CKMBINDEX, TROPONINI in the last 168 hours. BNP (last 3 results) No results for input(s): BNP in the last 8760 hours.  ProBNP (last 3 results) No results for input(s): PROBNP in the last 8760 hours.  CBG: Recent Labs  Lab 03/13/19 1157 03/13/19 1734 03/13/19 2048 03/14/19 0743 03/14/19 1208  GLUCAP 87 78 81 111* 131*    Recent Results (from the past 240 hour(s))  SARS CORONAVIRUS 2 (TAT 6-24 HRS) Nasopharyngeal Nasopharyngeal Swab     Status: None   Collection Time: 03/07/19 10:01 AM   Specimen:  Nasopharyngeal Swab  Result Value Ref Range Status   SARS Coronavirus 2 NEGATIVE NEGATIVE Final    Comment: (NOTE) SARS-CoV-2 target nucleic acids are NOT DETECTED. The SARS-CoV-2 RNA is generally detectable in upper and lower respiratory specimens during the acute phase of infection. Negative results do not preclude SARS-CoV-2 infection, do not rule out co-infections with other pathogens, and should not be used as the sole basis for treatment or other patient management decisions. Negative results must be combined with clinical observations, patient history, and epidemiological information. The expected result is Negative. Fact Sheet for Patients: SugarRoll.be Fact Sheet for Healthcare Providers: https://www.woods-mathews.com/ This test is not yet approved or cleared by the [place] FDA and  has been authorized for detection and/or diagnosis of SARS-CoV-2 by FDA under an Emergency Use Authorization (EUA). This EUA will remain  in effect (meaning this test can be used) for the duration of the COVID-19 declaration under Section 56 4(b)(1) of the Act, 21 U.S.C. section 360bbb-3(b)(1), unless the authorization is terminated or revoked sooner. Performed at [facility], [place] [address], [place], [place] [address]       Time spent :  25 minutes  Blain Pais, MD [facility]  **If unable to reach the above provider after paging please contact the [place] @ [phone]  On-Call/Text Page:      [email or web address]      password [email or web address]  If 7PM-7AM, please contact night-coverage [email or web address] Password [email or web address] 03/14/2019, 4:47 PM   LOS: 7 days

## 2019-03-14 NOTE — Progress Notes (Addendum)
Progress Note   Subjective  Chief Complaint: Gastric outlet obstruction status post EUS with dilation 03/12/2019  Tells me that he feels well this morning.  He has been tolerating his clear liquid diet with no further nausea or vomiting.  Tells me he still feels like he will get "full up sometimes", but has had no pain.  Does report that he had a very small bowel movement the evening after his last procedure but has had nothing since then.  He is passing flatus.  Aware of plans for repeat EGD tomorrow.  Denies any questions.   Objective   Vital signs in last 24 hours: Temp:  [98.2 F (36.8 C)-98.4 F (36.9 C)] 98.4 F (36.9 C) (12/10 0509) Pulse Rate:  [55-72] 55 (12/10 0509) Resp:  [18-20] 20 (12/10 0509) BP: (105-124)/(71-89) 124/89 (12/10 0509) SpO2:  [100 %] 100 % (12/10 0509) Last BM Date: 03/11/19 General:    AA male in NAD Heart:  Regular rate and rhythm; no murmurs Lungs: Respirations even and unlabored, lungs CTA bilaterally Abdomen:  Soft, nontender and nondistended. Decreased bowel sounds b/llower quadrants Extremities:  Without edema. Neurologic:  Alert and oriented,  grossly normal neurologically. Psych:  Cooperative. Normal mood and affect.  Intake/Output from previous day: 12/09 0701 - 12/10 0700 In: 892.5 [P.O.:480; I.V.:412.5] Out: 2 [Urine:2]  Lab Results: Recent Labs    03/13/19 0823 03/14/19 0555  WBC 3.6* 3.8*  HGB 11.0* 11.1*  HCT 34.9* 34.7*  PLT 174 172   BMET Recent Labs    03/13/19 0823 03/14/19 0555  NA 140 140  K 3.7 3.7  CL 109 109  CO2 24 26  GLUCOSE 115* 114*  BUN 9 10  CREATININE 0.94 0.89  CALCIUM 8.2* 8.2*   LFT Recent Labs    03/14/19 0555  PROT 5.6*  ALBUMIN 3.1*  AST 15  ALT 11  ALKPHOS 36*  BILITOT 0.7    Studies/Results: DG C-Arm 1-60 Min  Result Date: 03/12/2019 CLINICAL DATA:  Pyloric stenosis EXAM: DG C-ARM 1-60 MIN FLUOROSCOPY TIME:  Fluoroscopy Time:  2 minutes 17 seconds Number of Acquired Spot  Images: 10 COMPARISON:  CT abdomen pelvis 03/06/2019, radiograph 03/07/2019 FINDINGS: Sequential fluoroscopic images demonstrate cannulation of the pylorus and subsequent balloon dilatation. Some retained contrast media is noted within several bowel loops. There is mild gaseous distention of the bowel as well. IMPRESSION: Successful cannulation of the gland dilatation of the pylorus. Please refer to dedicated operative report for further findings. Electronically Signed   By: Lovena Le M.D.   On: 03/12/2019 15:54   EUS 03/12/19 Dr. Rush Landmark EGD Impression: - LA Grade D esophagitis with no bleeding. - A large amount of food (residue) in the stomach. Suctioned via endoscope. - Congested, erythematous, friable (with contact bleeding), granular, hemorrhagic appearing, texture changed and increased vascular pattern mucosa in the antrum. Biopsied. - Gastric stenosis was found in the prepyloric region of the stomach. Biopsied. Then Dilated. - No gross lesions in the first portion of the duodenum and in the second portion of the duodenum. EUS Impression: - Wall thickening was seen in the pylorus. The thickening appeared to be primarily within the deep mucosa (Layer 2) and submucosa (Layer 3). Some area of more significant hyperechogenicity was noted, not clearly a muscle layer tumor, but dark enough, decision made to obtain deeper tissue. Fine needle biopsy performed. - Pancreatic parenchymal abnormalities consisting of hyperechoic strands were noted in the genu of the pancreas, pancreatic body and pancreatic tail.  Otherwise, normal pancreatic duct in visualized neck/body/tail. - No malignant-appearing lymph nodes were visualized in the celiac region (level 20) but due to the amount of foodstuffs, this made it difficult for [place] to do a proper/complete EUS and so cannot say this is an adequate examination for all regions for LN assessment. Recommendations: - The patient will be observed  post-procedure, until all discharge criteria are met. - Return patient to hospital ward for ongoing care. - Clear liquid diet. - Observe patient's clinical course. - Await cytology results and await path results. - Repeat the upper endoscopic ultrasound in 3 days for retreatment. - If the biopsies return negative for malignancy, then may consider repeat dilations in effort of trying to enlarge the region, however, I have concern that endoscopic therapies will not be enough. So appreciate the surgical service still being present and available should gastrojejunostomy be required for gastric diversion. - The findings and recommendations were discussed with the patient. - The findings and recommendations were discussed with the patient's family. - The findings and recommendations were discussed with the referring physician   Assessment / Plan:   Assessment: 1.  Gastric outlet obstruction: EGD 03/08/2019 revealed reflux esophagitis with gastric stenosis at the level of the pylorus and severe luminal narrowing at the level of the duodenal bulb, large amount of retained food and fluid were noted and were evacuated during the procedure,  EUS/EGD 12/8 with severe erosive esophagitis, abnormal antral mucosa and gastric stenosis which was bx and dilated (results as above), patient now able to tolerate clears, plans for repeat EGD 03/15/2019  Plan: 1.  Continue clear liquid diet for now, n.p.o. after midnight and plans for repeat EGD tomorrow 2.  Continue IV PPI and H2 blocker 3.  Pathology which was returned from the pylorus showed benign gastric mucosa with reactive changes and from the antrum showed mild chronic gastritis with reactive changes and ulcer with no intestinal metaplasia identified. FNA bx still pending 4.  We will repeat EGD 03/15/2019.  Patient is aware of risks, as he has previously undergone this procedure.  He agrees to proceed. 5.  Please await further recommendations from Dr.  Rush Landmark  Thank you for your kind consultation, we will continue to follow.   LOS: 7 days   Levin Erp  03/14/2019, 8:59 AM

## 2019-03-14 NOTE — Care Management Important Message (Signed)
Important Message  Patient Details IM Letter given to [place] Case Manager to present to the Patient Name: Jonathan Lewis MRN: [id number] Date of Birth: [birth year]   Medicare Important Message Given:  Yes     Kerin Salen 03/14/2019, 11:15 AM

## 2019-03-15 ENCOUNTER — Inpatient Hospital Stay: Payer: Self-pay

## 2019-03-15 ENCOUNTER — Encounter (HOSPITAL_COMMUNITY): Admission: EM | Disposition: A | Discharge status: Home Health | Payer: Self-pay | Source: Home / Self Care

## 2019-03-15 ENCOUNTER — Inpatient Hospital Stay (HOSPITAL_COMMUNITY): Payer: Medicare Other

## 2019-03-15 ENCOUNTER — Encounter (HOSPITAL_COMMUNITY): Payer: Self-pay | Admitting: Internal Medicine

## 2019-03-15 DIAGNOSIS — Z79899 Other long term (current) drug therapy: Secondary | ICD-10-CM

## 2019-03-15 DIAGNOSIS — R112 Nausea with vomiting, unspecified: Secondary | ICD-10-CM

## 2019-03-15 DIAGNOSIS — D630 Anemia in neoplastic disease: Secondary | ICD-10-CM

## 2019-03-15 DIAGNOSIS — I10 Essential (primary) hypertension: Secondary | ICD-10-CM

## 2019-03-15 DIAGNOSIS — K219 Gastro-esophageal reflux disease without esophagitis: Secondary | ICD-10-CM

## 2019-03-15 DIAGNOSIS — N400 Enlarged prostate without lower urinary tract symptoms: Secondary | ICD-10-CM

## 2019-03-15 DIAGNOSIS — R634 Abnormal weight loss: Secondary | ICD-10-CM

## 2019-03-15 DIAGNOSIS — E785 Hyperlipidemia, unspecified: Secondary | ICD-10-CM

## 2019-03-15 DIAGNOSIS — Z794 Long term (current) use of insulin: Secondary | ICD-10-CM

## 2019-03-15 DIAGNOSIS — R630 Anorexia: Secondary | ICD-10-CM

## 2019-03-15 DIAGNOSIS — E1142 Type 2 diabetes mellitus with diabetic polyneuropathy: Secondary | ICD-10-CM

## 2019-03-15 DIAGNOSIS — C169 Malignant neoplasm of stomach, unspecified: Principal | ICD-10-CM

## 2019-03-15 LAB — CBC
HCT: 34.2 % — ABNORMAL LOW (ref 39.0–52.0)
Hemoglobin: 10.9 g/dL — ABNORMAL LOW (ref 13.0–17.0)
MCH: 28.1 pg (ref 26.0–34.0)
MCHC: 31.9 g/dL (ref 30.0–36.0)
MCV: 88.1 fL (ref 80.0–100.0)
Platelets: 183 10*3/uL (ref 150–400)
RBC: 3.88 MIL/uL — ABNORMAL LOW (ref 4.22–5.81)
RDW: 12.2 % (ref 11.5–15.5)
WBC: 3.8 10*3/uL — ABNORMAL LOW (ref 4.0–10.5)
nRBC: 0 % (ref 0.0–0.2)

## 2019-03-15 LAB — COMPREHENSIVE METABOLIC PANEL
ALT: 12 U/L (ref 0–44)
AST: 15 U/L (ref 15–41)
Albumin: 2.9 g/dL — ABNORMAL LOW (ref 3.5–5.0)
Alkaline Phosphatase: 39 U/L (ref 38–126)
Anion gap: 9 (ref 5–15)
BUN: 11 mg/dL (ref 8–23)
CO2: 26 mmol/L (ref 22–32)
Calcium: 8.4 mg/dL — ABNORMAL LOW (ref 8.9–10.3)
Chloride: 105 mmol/L (ref 98–111)
Creatinine, Ser: 0.84 mg/dL (ref 0.61–1.24)
GFR calc Af Amer: >60 mL/min (ref >60)
GFR calc non Af Amer: >60 mL/min (ref >60)
Glucose, Bld: 121 mg/dL — ABNORMAL HIGH (ref 70–99)
Potassium: 3.7 mmol/L (ref 3.5–5.1)
Sodium: 140 mmol/L (ref 135–145)
Total Bilirubin: 0.5 mg/dL (ref 0.3–1.2)
Total Protein: 5.4 g/dL — ABNORMAL LOW (ref 6.5–8.1)

## 2019-03-15 LAB — GLUCOSE, CAPILLARY
Glucose-Capillary: 118 mg/dL — ABNORMAL HIGH (ref 70–99)
Glucose-Capillary: 99 mg/dL (ref 70–99)
Glucose-Capillary: 85 mg/dL (ref 70–99)

## 2019-03-15 LAB — MAGNESIUM: Magnesium: 1.9 mg/dL (ref 1.7–2.4)

## 2019-03-15 LAB — SURGICAL PATHOLOGY

## 2019-03-15 LAB — PREALBUMIN: Prealbumin: 18 mg/dL (ref 18–38)

## 2019-03-15 LAB — PHOSPHORUS: Phosphorus: 2.6 mg/dL (ref 2.5–4.6)

## 2019-03-15 SURGERY — EGD (ESOPHAGOGASTRODUODENOSCOPY)
Anesthesia: Monitor Anesthesia Care

## 2019-03-15 MED ORDER — IOHEXOL 300 MG/ML  SOLN
75.0000 mL | Freq: Once | INTRAMUSCULAR | Status: AC | PRN
  Administered 2019-03-15: 75 mL via INTRAVENOUS

## 2019-03-15 MED ORDER — POTASSIUM CHLORIDE 10 MEQ/100ML IV SOLN
10.0000 meq | INTRAVENOUS | Status: AC
  Administered 2019-03-15 (×2): 10 meq via INTRAVENOUS
  Filled 2019-03-15 (×2): qty 100

## 2019-03-15 MED ORDER — ENOXAPARIN SODIUM 40 MG/0.4ML ~~LOC~~ SOLN
40.0000 mg | SUBCUTANEOUS | Status: DC
  Administered 2019-03-15 – 2019-03-18 (×4): 40 mg via SUBCUTANEOUS
  Filled 2019-03-15 (×4): qty 0.4

## 2019-03-15 MED ORDER — SODIUM CHLORIDE 0.9% FLUSH
10.0000 mL | INTRAVENOUS | Status: DC | PRN
  Administered 2019-03-25 – 2019-03-27 (×3): 10 mL

## 2019-03-15 MED ORDER — MAGNESIUM SULFATE IN D5W 1-5 GM/100ML-% IV SOLN
1.0000 g | Freq: Once | INTRAVENOUS | Status: AC
  Administered 2019-03-15: 1 g via INTRAVENOUS
  Filled 2019-03-15: qty 100

## 2019-03-15 MED ORDER — CHLORHEXIDINE GLUCONATE CLOTH 2 % EX PADS
6.0000 | MEDICATED_PAD | Freq: Every day | CUTANEOUS | Status: DC
  Administered 2019-03-15 – 2019-03-18 (×4): 6 via TOPICAL

## 2019-03-15 MED ORDER — SODIUM CHLORIDE 0.9% FLUSH
10.0000 mL | Freq: Two times a day (BID) | INTRAVENOUS | Status: DC
  Administered 2019-03-16 – 2019-04-04 (×16): 10 mL

## 2019-03-15 MED ORDER — TRAVASOL 10 % IV SOLN
INTRAVENOUS | Status: AC
  Administered 2019-03-15: 18:00:00 via INTRAVENOUS
  Filled 2019-03-15: qty 451.2

## 2019-03-15 MED ORDER — DEXTROSE-NACL 5-0.9 % IV SOLN
INTRAVENOUS | Status: AC
  Administered 2019-03-15: 19:00:00 via INTRAVENOUS

## 2019-03-15 MED ORDER — POTASSIUM PHOSPHATES 15 MMOLE/5ML IV SOLN
10.0000 mmol | Freq: Once | INTRAVENOUS | Status: AC
  Administered 2019-03-15: 13:00:00 10 mmol via INTRAVENOUS
  Filled 2019-03-15: qty 3.33

## 2019-03-15 MED ORDER — INSULIN ASPART 100 UNIT/ML ~~LOC~~ SOLN
0.0000 [IU] | Freq: Four times a day (QID) | SUBCUTANEOUS | Status: DC
  Administered 2019-03-16 – 2019-03-18 (×3): 1 [IU] via SUBCUTANEOUS
  Administered 2019-03-19: 5 [IU] via SUBCUTANEOUS
  Administered 2019-03-19: 3 [IU] via SUBCUTANEOUS
  Administered 2019-03-20 (×2): 2 [IU] via SUBCUTANEOUS
  Administered 2019-03-20 – 2019-03-21 (×3): 3 [IU] via SUBCUTANEOUS

## 2019-03-15 MED ORDER — SODIUM CHLORIDE (PF) 0.9 % IJ SOLN
INTRAMUSCULAR | Status: AC
  Filled 2019-03-15: qty 50

## 2019-03-15 NOTE — Progress Notes (Signed)
NUTRITION NOTE  Consult received for TPN start. See RD note from earlier this AM. Will d/c Boost Breeze and prostat at this time as patient to remain NPO.    Jarome Matin, MS, RD, LDN, [facility] Inpatient Clinical Dietitian Pager # [phone] After hours/weekend pager # [phone]

## 2019-03-15 NOTE — Progress Notes (Signed)
Spoke with Navreet RN re PICC order.  Notified will be there to place PICC this pm.  Already spoken to pharmacy as well.

## 2019-03-15 NOTE — Progress Notes (Signed)
  3 Days Post-Op  Subjective: No complaints  Objective: Vital signs in last 24 hours: Temp:  [99.5 F (37.5 C)-99.7 F (37.6 C)] 99.7 F (37.6 C) (12/11 0539) Pulse Rate:  [67-69] 69 (12/11 0539) Resp:  [16-18] 16 (12/11 0539) BP: (113-128)/(76-78) 113/78 (12/11 0539) SpO2:  [99 %-100 %] 99 % (12/11 0539) Weight:  [77.8 kg] 77.8 kg (12/11 0900) Last BM Date: 03/11/19  Intake/Output from previous day: 12/10 0701 - 12/11 0700 In: 3255 [P.O.:480; I.V.:2775] Out: -  Intake/Output this shift: No intake/output data recorded.  General appearance: alert and cooperative Resp: clear to auscultation bilaterally Cardio: regular rate and rhythm GI: soft, non-tender; bowel sounds normal; no masses,  no organomegaly  Lab Results:  Recent Labs    03/14/19 0555 03/15/19 0810  WBC 3.8* 3.8*  HGB 11.1* 10.9*  HCT 34.7* 34.2*  PLT 172 183   BMET Recent Labs    03/14/19 0555 03/15/19 0810  NA 140 140  K 3.7 3.7  CL 109 105  CO2 26 26  GLUCOSE 114* 121*  BUN 10 11  CREATININE 0.89 0.84  CALCIUM 8.2* 8.4*   PT/INR No results for input(s): LABPROT, INR in the last 72 hours. ABG No results for input(s): PHART, HCO3 in the last 72 hours.  Invalid input(s): PCO2, PO2  Studies/Results: No results found.  Anti-infectives: Anti-infectives (From admission, onward)   None      Assessment/Plan: s/p Procedure(s): UPPER ESOPHAGEAL ENDOSCOPIC ULTRASOUND (EUS) ESOPHAGOGASTRODUODENOSCOPY (EGD) WITH PROPOFOL BIOPSY FINE NEEDLE ASPIRATION (FNA) LINEAR BALLOON DILATION 2nd set of biopsies showed adenocarcinoma of stomach. It appears to be involving just the distal stomach. Given the level of narrowing this will likely need surgery during this admission. He will need CT chest for staging. Will need prealbumin checked and if low would recommend tpn through the weekend with plan for surgery early next week. I discussed this with him and his wife and they understand and all questions  were answered  LOS: 8 days    Autumn Messing III 03/15/2019

## 2019-03-15 NOTE — Progress Notes (Signed)
Peripherally Inserted Central Catheter/Midline Placement  The IV Nurse has discussed with the patient and/or persons authorized to consent for the patient, the purpose of this procedure and the potential benefits and risks involved with this procedure.  The benefits include less needle sticks, lab draws from the catheter, and the patient may be discharged home with the catheter. Risks include, but not limited to, infection, bleeding, blood clot (thrombus formation), and puncture of an artery; nerve damage and irregular heartbeat and possibility to perform a PICC exchange if needed/ordered by physician.  Alternatives to this procedure were also discussed.  Bard Power PICC patient education guide, fact sheet on infection prevention and patient information card has been provided to patient /or left at bedside.    PICC/Midline Placement Documentation  PICC Double Lumen [id number] PICC Right Basilic 41 cm 0 cm (Active)  Indication for Insertion or Continuance of Line Administration of hyperosmolar/irritating solutions (i.e. TPN, Vancomycin, etc.) 03/15/19 1427  Exposed Catheter (cm) 0 cm 03/15/19 1427  Site Assessment Clean;Dry;Intact 03/15/19 1427  Lumen #1 Status Flushed;Saline locked;Blood return noted 03/15/19 1427  Lumen #2 Status Flushed;Saline locked;Blood return noted 03/15/19 1427  Dressing Type Transparent;Securing device 03/15/19 1427  Dressing Status Clean;Dry;Intact;Antimicrobial disc in place 03/15/19 1427  Dressing Intervention New dressing 03/15/19 1427  Dressing Change Due 03/22/19 03/15/19 1427       Enos Fling 03/15/2019, 2:29 PM

## 2019-03-15 NOTE — Progress Notes (Signed)
PHARMACY - TOTAL PARENTERAL NUTRITION CONSULT NOTE   Indication: GOO  Patient Measurements: Height: 5\' 8"  (172.7 cm) Weight: 171 lb 8.3 oz (77.8 kg) IBW/kg (Calculated) : 68.4 TPN AdjBW (KG): 77.8 Body mass index is 26.08 kg/m. Usual Weight:   Assessment:  Patient is a 78 y.o M presented to ED on 12/3 with c/o recurrent emesis and abdominal discomfort.  Abdominal CT on 12/2 showed findings consistent with "outlet obstruction or severe dysmotility" and suspected gastric mass. He underwent EGD with biopsy on 12/4 and repeat EUS/EGD with dilation/biopsy on 12/8.  Biopsy done on 12/8 came back with evidence of gastric adenocarcinoma.  CCS recom. surgery with this admssion and to start TPN on 12/11.   Glucose / Insulin: sensitive SSI tid; cbgs <150 Electrolytes: K 3.7, phos 2.6, Mag 1.9; CorrCa 9.28; Cl and CO2 wnl Renal: scr wnl LFTs / TGs: - AST/ALT wnl - TG pending  Prealbumin / albumin:  - albumin 2.9 (12/11) - prealbumin 18 (12/11) Intake / Output; MIVF: I/O + [id number];  D5 NS at 75 ml/hr GI Imaging: Surgeries / Procedures:  - 12/4 EGD: LA grade D esophagitis, severe stenosis was found at the pylorus, large amount of food residue in stomach, biopsies taken - 12/7: biopsies negative for cancer - 12/8: EUS and EGD with dilation (severe pylorus stenosis- biopsied and dilated) - 12/10: pylorus biopsy showed evidence of gastric adenocarcinoma  Central access: PICC pending TPN start date: 12/11 pending PICC  Nutritional Goals (per RD recommendation on 12/11): Kcal:  1880-2110 kcal Protein:  90-100 grams Fluid:  >/= 2 L/day  Goal TPN rate is 85 mL/hr (provides 96 g of protein and 2045 kcals per day)  Current Nutrition: NPO starting on 12/11 - Boost tid started on 12/4>> 12/11 - pro-stat tid started on 12/4>> 12/11  Plan:   Now - magnesium sulfate 1gm IV x1 - potassium phosphate 10 mmol IV x1 - potassium chloride 10 mEq IV x2 runs   - Start TPN at 40 mL/hr at 1800 -   Electrolytes in TPN: 31mEq/L of Na, 13mEq/L of K, 33mEq/L of Ca, 57mEq/L of Mg, and 58mmol/L of Phos. Cl:Ac ratio 1:1 - Pt's now NPO, will d/c oral MVI and add to TPN MWF (due to backorder status). Add trace elements to TPN  - d/c oral pepcid. Add pepcid to TPN - change  sSSI to q6h and adjust as needed  - Reduce MIVF to 30 mL/hr at 1800 - CMET, phos, mag, TG, prealbumin on 11/12 - Monitor TPN labs on Mon/Thurs  Kyden Potash P 03/15/2019,10:30 AM

## 2019-03-15 NOTE — Progress Notes (Signed)
Nutrition Follow-up  DOCUMENTATION CODES:   Non-severe (moderate) malnutrition in context of acute illness/injury  INTERVENTION:  - continue Boost Breeze TID and 30 ml prostat BID.  - diet re-advancement as medically feasible.  - if diet unable to be advanced beyond CLD within the next 48 hours, highly recommend initiation of TPN for patient who meets criteria for moderate malnutrition with high risk of further severity of malnutrition.    NUTRITION DIAGNOSIS:   Moderate Malnutrition related to acute illness as evidenced by percent weight loss, moderate muscle depletion, mild fat depletion. -ongoing  GOAL:   Patient will meet greater than or equal to 90% of their needs -unmet/unable to meet   MONITOR:   PO intake, Supplement acceptance, Diet advancement, Labs, Weight trends  ASSESSMENT:   78 y.o. male with medical history significant of GERD, HTN, hyperglycemia not on any diabetic medication, acoustic neuroma, and BPH. Patient was admitted for persistent N/V and burping x1 month. He reports decreased appetite and 20 lb weight loss in that time frame. CT abdomen showed GOO from a possible mass.  Patient has been NPO or on CLD since admission 7 days ago. He was most recently switched from CLD to NPO at midnight. Boost Breeze and prostat supplements ordered and patient has been accepting them ~90% of the time offered. Patient has not been weighed since admission.   Per notes: - GOO 2/2 severe erosive esophagitis with stricture s/p EGD with dilation on 12/8 - biopsies taken due to concern for malignancy--results pending - GI following, Surgery signed off on 12/8    Labs reviewed; CBG: 118 mg/dl, Ca: 8.2 mg/dl. Medications reviewed; 20 mg oral pepcid BID, sliding scale novolog, 15 ml liquid multivitamin/day, 40 mg IV protonix BID.  IVF; NS @ 100 ml/hr.   Diet Order:   Diet Order            Diet NPO time specified  Diet effective midnight              EDUCATION NEEDS:    Not appropriate for education at this time  Skin:  Skin Assessment: Reviewed RN Assessment  Last BM:  12/7  Height:   Ht Readings from Last 1 Encounters:  03/07/19 5\' 8"  (1.727 m)    Weight:   Wt Readings from Last 1 Encounters:  03/07/19 75.3 kg    Ideal Body Weight:  70 kg  BMI:  Body mass index is 25.24 kg/m.  Estimated Nutritional Needs:   Kcal:  1880-2110 kcal  Protein:  90-100 grams  Fluid:  >/= 2 L/day      Jarome Matin, MS, RD, LDN, [facility] Inpatient Clinical Dietitian Pager # [phone] After hours/weekend pager # [phone]

## 2019-03-15 NOTE — Consult Note (Addendum)
Jonathan Lewis  Telephone:([phone]) [phone] Fax:(336) [phone]   MEDICAL ONCOLOGY - INITIAL CONSULTATION  Referral MD: Dr. Adele Barthel  Reason for Referral: Gastric adenocarcinoma  HPI: Jonathan Lewis is a 78 year old male with a past medical history significant for GERD, hypertension, diabetes with resultant peripheral neuropathy - not currently on diabetic medication, acoustic neuroma, glaucoma, hyperlipidemia, BPH.  The patient presented to the emergency room with pain with burping and nausea and vomiting.  He had also reported decreased appetite and weight loss about 20 pounds in the past month.  The patient had a CT of the abdomen pelvis with contrast the day prior to admission which showed marked distention of the stomach which is filled with food, fluid, and contrast material.  The appearance is compatible with outlet obstruction or severe dysmotility, 2.0 x 1.7 cm nodular focus of soft tissue density in the region of the pylorus with features concerning for mass lesion with secondary gastric outlet obstruction, no evidence of lymphadenopathy or metastatic disease in the abdomen pelvis, nonobstructing right renal stone.  GI and general surgery were consulted.  An EGD was performed on 03/08/2019 and biopsy showed mild chronic gastritis without activity, no intestinal metaplasia identified, negative H. pylori.  The patient underwent endoscopic ultrasound on 03/12/2019 and cytology showed malignant cells consistent with adenocarcinoma.  General surgery is following the patient and given the level of narrowing, the patient will likely need surgery during this admission.  They want to obtain a CT of the chest for staging purposes and anticipate surgery early next week.  The patient states that prior to admission he had anorexia and weight loss of approximately 20 pounds over the past month.  He was having the sensation of his food not moving through his GI tract and some vomiting at home.  His  vomiting is since resolved.  He is not currently having any nausea.  He reported some constipation prior to admission and during this hospitalization.  Bowels are now moving.  He denies headaches and vision changes.  Denies chest discomfort, shortness of breath, cough.  Not having any abdominal pain.  Reported 1 episode of blood after a bowel movement during this hospitalization, but none since.  He did not notice any bleeding prior to admission.  The patient is married.  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  He has 7 children.  Reports a remote history of tobacco use and quit approximately 30 years ago.  He does not currently drink any alcohol reports that he quit drinking all alcohol about 30 years ago.  Reports a family history of colon cancer in his brother.  He is unaware of any other cancers in his family.  Medical oncology was asked see the patient to make recommendations regarding his newly diagnosed gastric adenocarcinoma.    Past Medical History:  Diagnosis Date  . Acoustic neuroma ([place]) 06/25/2015  . Allergic rhinitis 07/18/2014  . Arthritis   . BPH (benign prostatic hyperplasia)   . Carbuncle    recurrent MRSA carbuncles  . Cataract   . Diabetes mellitus    Type II  . Disk prolapse   . Glaucoma   . Hyperlipidemia   . Hypertension   . Male hypogonadism 07/16/2014  . Sinus bradycardia    chronic, asymptomatic  . Sinusitis 07/29/2012  :  Past Surgical History:  Procedure Laterality Date  . BIOPSY  03/08/2019   Procedure:  BIOPSY;  Surgeon: Yetta Flock, MD;  Location: [facility];  Service: Gastroenterology;;  . CATARACT EXTRACTION     x 2  . COLONOSCOPY    . ESOPHAGOGASTRODUODENOSCOPY N/A 03/08/2019   Procedure: ESOPHAGOGASTRODUODENOSCOPY (EGD);  Surgeon: Yetta Flock, MD;  Location: [facility];  Service: Gastroenterology;  Laterality: N/A;  . FOREIGN BODY REMOVAL  03/08/2019    Procedure: FOREIGN BODY REMOVAL;  Surgeon: Yetta Flock, MD;  Location: [facility];  Service: Gastroenterology;;  . POLYPECTOMY    :  Current Facility-Administered Medications  Medication Dose Route Frequency Provider Last Rate Last Admin  . 0.9 %  sodium chloride infusion   Intravenous Continuous Lacretia Leigh, MD   Stopped at 03/12/19 1900  . 0.9 %  sodium chloride infusion   Intravenous Continuous Georgette Shell, MD 100 mL/hr at 03/14/19 1800 Rate Verify at 03/14/19 1800  . dextrose 5 %-0.9 % sodium chloride infusion   Intravenous Continuous Hosie Poisson, MD 75 mL/hr at 03/14/19 1000 Rate Verify at 03/14/19 1000  . famotidine (PEPCID) 40 MG/5ML suspension 20 mg  20 mg Oral BID Samella Parr, NP   20 mg at 03/14/19 2131  . feeding supplement (BOOST / RESOURCE BREEZE) liquid 1 Container  1 Container Oral TID BM Focht, Jessica L, PA   1 Container at 03/14/19 2134  . feeding supplement (PRO-STAT SUGAR FREE 64) liquid 30 mL  30 mL Oral TID BM Hosie Poisson, MD   30 mL at 03/14/19 1700  . gabapentin (NEURONTIN) 250 MG/5ML solution 100 mg  100 mg Oral Q12H Samella Parr, NP   100 mg at 03/14/19 2131  . hydrALAZINE (APRESOLINE) injection 5 mg  5 mg Intravenous Q6H PRN Georgette Shell, MD      . insulin aspart (novoLOG) injection 0-9 Units  0-9 Units Subcutaneous TID WC Georgette Shell, MD   1 Units at 03/14/19 1229  . magnesium hydroxide (MILK OF MAGNESIA) suspension 30 mL  30 mL Oral Daily PRN Adele Barthel D, MD   30 mL at 03/14/19 1713  . multivitamin liquid 15 mL  15 mL Per Tube Daily Hosie Poisson, MD   15 mL at 03/14/19 0953  . pantoprazole (PROTONIX) injection 40 mg  40 mg Intravenous Q12H Georgette Shell, MD   40 mg at 03/15/19 [id number]     No Known Allergies:  Family History  Problem Relation Age of Onset  . Cancer Mother   . Hypertension Mother   . Diabetes Sister   . Cancer Brother        colon  . Hypertension Brother   . Hyperlipidemia  Brother   . Stroke Brother   . Hypertension Sister   . Colon cancer Neg Hx   . Heart attack Neg Hx   . Sudden death Neg Hx   :  Social History   Socioeconomic History  . Marital status: Married    Spouse name: Not on file  . Number of children: 7  . Years of education: 7  . Highest education level: Not on file  Occupational History  . Occupation: Mining engineer: RETIRED  Tobacco Use  . Smoking status: Former Smoker    Quit date: 10/03/1978    Years since quitting: 40.4  . Smokeless tobacco: Never Used  Substance and Sexual Activity  . Alcohol use: Yes    Alcohol/week: 5.0 standard drinks    Types: 5 Standard drinks or equivalent per week    Comment: pint, occasion  beer  . Drug use: No  . Sexual activity: Yes    Partners: Female  Other Topics Concern  . Not on file  Social History Narrative   9th grade.  Married '60. [name] 3 sons -'77 '64, '66; 4 dtrs -'60, '62, '61, '65; 15 grands, 25 great-grands.   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   ACD- discussed living will and HCPOA (July '14) provided packet.          Social Determinants of Health   Financial Resource Strain: Low Risk   . Difficulty of Paying Living Expenses: Not hard at all  Food Insecurity: No Food Insecurity  . Worried About Charity fundraiser in the Last Year: Never true  . Ran Out of Food in the Last Year: Never true  Transportation Needs: No Transportation Needs  . Lack of Transportation (Medical): No  . Lack of Transportation (Non-Medical): No  Physical Activity: Inactive  . Days of Exercise per Week: 0 days  . Minutes of Exercise per Session: 0 min  Stress: No Stress Concern Present  . Feeling of Stress : Not at all  Social Connections: Not Isolated  . Frequency of Communication with Friends and Family: More than three times a week  . Frequency of Social Gatherings with Friends and Family: More than three times a week  . Attends Religious Services: More than 4 times per  year  . Active Member of Clubs or Organizations: Yes  . Attends Archivist Meetings: More than 4 times per year  . Marital Status: Married  Human resources officer Violence: Not At Risk  . Fear of Current or Ex-Partner: No  . Emotionally Abused: No  . Physically Abused: No  . Sexually Abused: No  :  Review of Systems: A comprehensive 14 point review of systems was negative except as noted in the HPI.  Exam: Patient Vitals for the past 24 hrs:  BP Temp Temp src Pulse Resp SpO2 Height Weight  03/15/19 0900 -- -- -- -- -- -- 5\' 8"  (1.727 m) 171 lb 8.3 oz (77.8 kg)  03/15/19 0539 113/78 99.7 F (37.6 C) Oral 69 16 99 % -- --  03/14/19 2017 128/76 99.5 F (37.5 C) Oral 67 18 100 % -- --    General:  well-nourished in no acute distress.   Eyes:  no scleral icterus.   ENT:  There were no oropharyngeal lesions.   Neck was without thyromegaly.   Lymphatics:  Negative cervical, supraclavicular or axillary adenopathy.   Respiratory: lungs were clear bilaterally without wheezing or crackles.   Cardiovascular:  Regular rate and rhythm, S1/S2, without murmur, rub or gallop.  There was no pedal edema.   GI:  abdomen was soft, flat, nontender, nondistended, without organomegaly.   Musculoskeletal:  no spinal tenderness of palpation of vertebral spine.   Skin exam was without echymosis, petichae.   Neuro exam was nonfocal. Patient was alert and oriented.  Attention was good.   Language was appropriate.  Mood was normal without depression.  Speech was not pressured.  Thought content was not tangential.     Lab Results  Component Value Date   WBC 3.8 (L) 03/15/2019   HGB 10.9 (L) 03/15/2019   HCT 34.2 (L) 03/15/2019   PLT 183 03/15/2019   GLUCOSE 121 (H) 03/15/2019   CHOL 160 05/14/2018   TRIG 140.0 05/14/2018   HDL 29.80 (L) 05/14/2018   LDLDIRECT 121.0 11/07/2017   LDLCALC 102 (H) 05/14/2018   ALT  12 03/15/2019   AST 15 03/15/2019   NA 140 03/15/2019   K 3.7 03/15/2019   CL 105  03/15/2019   CREATININE 0.84 03/15/2019   BUN 11 03/15/2019   CO2 26 03/15/2019    DG Abd 1 View  Result Date: 03/07/2019 CLINICAL DATA:  NG tube placement. EXAM: ABDOMEN - 1 VIEW COMPARISON:  CT yesterday FINDINGS: Tip and side port of the enteric tube below the diaphragm in the stomach. Enteric contrast throughout the colon from prior CT. No evidence of free air. IMPRESSION: Tip and side port of the enteric tube below the diaphragm in the stomach. Electronically Signed   By: Keith Rake M.D.   On: 03/07/2019 19:31   CT Abdomen Pelvis W Contrast  Result Date: 03/06/2019 CLINICAL DATA:  Epigastric pain with nausea and vomiting. Decreased appetite. EXAM: CT ABDOMEN AND PELVIS WITH CONTRAST TECHNIQUE: Multidetector CT imaging of the abdomen and pelvis was performed using the standard protocol following bolus administration of intravenous contrast. CONTRAST:  14mL OMNIPAQUE IOHEXOL 300 MG/ML  SOLN COMPARISON:  05/07/2010 FINDINGS: Lower chest: Unremarkable. Hepatobiliary: 4 mm hypodensity in the dome of liver is too small to characterize but is probably benign. This may be a tiny cyst. There is no evidence for gallstones, gallbladder wall thickening, or pericholecystic fluid. No intrahepatic or extrahepatic biliary dilation. Pancreas: Pancreas is diffusely atrophic without main duct dilatation. Spleen: Insert no splenomegaly Adrenals/Urinary Tract: No adrenal nodule or mass. 4 x 9 x 7 mm nonobstructing stone is identified interpolar right kidney. Kidneys otherwise unremarkable. No evidence for hydroureter. The urinary bladder appears normal for the degree of distention. Stomach/Bowel: The stomach is markedly distended with food, fluid, and contrast material. 2.0 x 1.7 cm nodular focus of soft tissue attenuation is identified in the region of the pylorus. This nodular soft tissue density is identifiable in all 3 planes. Duodenum is decompressed. Small bowel is largely decompressed without wall  thickening. The terminal ileum is normal. The appendix is normal. No gross colonic mass. No colonic wall thickening. Vascular/Lymphatic: No abdominal aortic aneurysm. There is no gastrohepatic or hepatoduodenal ligament lymphadenopathy. No intraperitoneal or retroperitoneal lymphadenopathy. No pelvic sidewall lymphadenopathy. Reproductive: Prostate gland appears mildly enlarged. Other: No intraperitoneal free fluid. Musculoskeletal: Small left groin hernia contains only fat. Small umbilical hernia contains only fat. No worrisome lytic or sclerotic osseous abnormality. IMPRESSION: 1. Marked distention of the stomach which is filled with food, fluid, and contrast material. This appearance is compatible with outlet obstruction or severe dysmotility. There is a 2.0 x 1.7 cm nodular focus of soft tissue density in the region of the pylorus and while this area is difficult to assess by CT, features certainly raise concern for mass lesion with secondary gastric outlet obstruction. 2. No evidence for lymphadenopathy or metastatic disease in the abdomen/pelvis. 3. Nonobstructing right renal stone. These results will be called to the ordering clinician or representative by the Radiologist Assistant, and communication documented in the PACS or zVision Dashboard. Electronically Signed   By: Misty Stanley M.D.   On: 03/06/2019 14:42   DG C-Arm 1-60 Min  Result Date: 03/12/2019 CLINICAL DATA:  Pyloric stenosis EXAM: DG C-ARM 1-60 MIN FLUOROSCOPY TIME:  Fluoroscopy Time:  2 minutes 17 seconds Number of Acquired Spot Images: 10 COMPARISON:  CT abdomen pelvis 03/06/2019, radiograph 03/07/2019 FINDINGS: Sequential fluoroscopic images demonstrate cannulation of the pylorus and subsequent balloon dilatation. Some retained contrast media is noted within several bowel loops. There is mild gaseous distention of the bowel as  well. IMPRESSION: Successful cannulation of the gland dilatation of the pylorus. Please refer to dedicated  operative report for further findings. Electronically Signed   By: Lovena Le M.D.   On: 03/12/2019 15:54   [place] EKG SITE RITE  Result Date: 03/15/2019 If Site Rite image not attached, placement could not be confirmed due to current cardiac rhythm.    DG Abd 1 View  Result Date: 03/07/2019 CLINICAL DATA:  NG tube placement. EXAM: ABDOMEN - 1 VIEW COMPARISON:  CT yesterday FINDINGS: Tip and side port of the enteric tube below the diaphragm in the stomach. Enteric contrast throughout the colon from prior CT. No evidence of free air. IMPRESSION: Tip and side port of the enteric tube below the diaphragm in the stomach. Electronically Signed   By: Keith Rake M.D.   On: 03/07/2019 19:31   CT Abdomen Pelvis W Contrast  Result Date: 03/06/2019 CLINICAL DATA:  Epigastric pain with nausea and vomiting. Decreased appetite. EXAM: CT ABDOMEN AND PELVIS WITH CONTRAST TECHNIQUE: Multidetector CT imaging of the abdomen and pelvis was performed using the standard protocol following bolus administration of intravenous contrast. CONTRAST:  155mL OMNIPAQUE IOHEXOL 300 MG/ML  SOLN COMPARISON:  05/07/2010 FINDINGS: Lower chest: Unremarkable. Hepatobiliary: 4 mm hypodensity in the dome of liver is too small to characterize but is probably benign. This may be a tiny cyst. There is no evidence for gallstones, gallbladder wall thickening, or pericholecystic fluid. No intrahepatic or extrahepatic biliary dilation. Pancreas: Pancreas is diffusely atrophic without main duct dilatation. Spleen: Insert no splenomegaly Adrenals/Urinary Tract: No adrenal nodule or mass. 4 x 9 x 7 mm nonobstructing stone is identified interpolar right kidney. Kidneys otherwise unremarkable. No evidence for hydroureter. The urinary bladder appears normal for the degree of distention. Stomach/Bowel: The stomach is markedly distended with food, fluid, and contrast material. 2.0 x 1.7 cm nodular focus of soft tissue attenuation is identified in the  region of the pylorus. This nodular soft tissue density is identifiable in all 3 planes. Duodenum is decompressed. Small bowel is largely decompressed without wall thickening. The terminal ileum is normal. The appendix is normal. No gross colonic mass. No colonic wall thickening. Vascular/Lymphatic: No abdominal aortic aneurysm. There is no gastrohepatic or hepatoduodenal ligament lymphadenopathy. No intraperitoneal or retroperitoneal lymphadenopathy. No pelvic sidewall lymphadenopathy. Reproductive: Prostate gland appears mildly enlarged. Other: No intraperitoneal free fluid. Musculoskeletal: Small left groin hernia contains only fat. Small umbilical hernia contains only fat. No worrisome lytic or sclerotic osseous abnormality. IMPRESSION: 1. Marked distention of the stomach which is filled with food, fluid, and contrast material. This appearance is compatible with outlet obstruction or severe dysmotility. There is a 2.0 x 1.7 cm nodular focus of soft tissue density in the region of the pylorus and while this area is difficult to assess by CT, features certainly raise concern for mass lesion with secondary gastric outlet obstruction. 2. No evidence for lymphadenopathy or metastatic disease in the abdomen/pelvis. 3. Nonobstructing right renal stone. These results will be called to the ordering clinician or representative by the Radiologist Assistant, and communication documented in the PACS or zVision Dashboard. Electronically Signed   By: Misty Stanley M.D.   On: 03/06/2019 14:42   DG C-Arm 1-60 Min  Result Date: 03/12/2019 CLINICAL DATA:  Pyloric stenosis EXAM: DG C-ARM 1-60 MIN FLUOROSCOPY TIME:  Fluoroscopy Time:  2 minutes 17 seconds Number of Acquired Spot Images: 10 COMPARISON:  CT abdomen pelvis 03/06/2019, radiograph 03/07/2019 FINDINGS: Sequential fluoroscopic images demonstrate cannulation of the pylorus  and subsequent balloon dilatation. Some retained contrast media is noted within several bowel  loops. There is mild gaseous distention of the bowel as well. IMPRESSION: Successful cannulation of the gland dilatation of the pylorus. Please refer to dedicated operative report for further findings. Electronically Signed   By: Lovena Le M.D.   On: 03/12/2019 15:54   [place] EKG SITE RITE  Result Date: 03/15/2019 If Site Rite image not attached, placement could not be confirmed due to current cardiac rhythm.   Pathology:  CYTOLOGY - NON PAP  CASE: [id number]  PATIENT: Jonathan Lewis  Non-Gynecological Cytology Report   Clinical History: Duodenal stricture  Specimen Submitted: A. GASTRIC, ANTRUM WALL, FINE NEEDLE ASPIRATION:   FINAL MICROSCOPIC DIAGNOSIS:  - Malignant cells consistent with adenocarcinoma  - See comment   SPECIMEN ADEQUACY:  Satisfactory for evaluation   DIAGNOSTIC COMMENTS:  Dr. Jeannie Done reviewed the case and agrees with the above diagnosis. Dr.  Rush Landmark was notified of these results on March 14, 2019.    Assessment and Plan:  1.  Distal gastric adenocarcinoma 2.  Normocytic anemia 3.  Protein calorie malnutrition 4.  Hypertension 5.  Diabetes mellitus with peripheral neuropathy 6.  Hyperlipidemia  -Discussed biopsy results with the patient.  He is awaiting surgery early next week.  Recommend completing his staging work-up including a CT of the chest with contrast while he is here in the hospital.  I note that gastroenterology indicated that they could not fully evaluate his lymph nodes during the EUS due to significant amount of foodstuffs that were present.  May need to consider a PET scan as an outpatient.  Further recommendations regarding any additional treatment pending the results of the biopsy and staging work-up. -The patient has mild anemia due to underlying malignancy.  I will add on additional lab work to his next lab draw including a ferritin, iron studies, vitamin B12 level, and folate level. -The patient will begin TPN later today after  PICC line is placed. -Continue ongoing management of his chronic medical conditions including hypertension, diabetes, and hyperlipidemia per hospitalist.   Thank you for this referral.   Mikey Bussing, DNP, AGPCNP-BC, AOCNP  ADDENDUM: I saw and examined Mr. Jachim.  His wife also was there.  He is very nice.  He is actually a Company secretary.  As such, we had a very good prayer session.  I very much appreciate everybody's help with Mr. Mubarak.  Looks like this is going to be a localized gastric adenocarcinoma.  I cannot find anything on his scans that look like there is metastatic disease.  He just had a CT scan of the chest done today which does not show any obvious metastatic disease.  I believe that this is definitely a surgical issue.  Hopefully, he can have surgery for a curative resection.  He will need adjuvant chemotherapy and radiation therapy more than likely.  His albumin is 2.9.  His prealbumin is 18.  These are not all that bad.  He definitely needs to be on anticoagulation.  Adenocarcinoma the stomach is a significant risk for thromboembolic disease.  He actually looks like he is in good shape.  I would think that he should be able to get through surgery with out a lot of difficulty.  He, I believe, will be getting TNA to try to help with his nutritional state.  For right now, I cannot think of any additional test that need to be done.  He is a little anemic.  It will  be interesting to see what is iron levels are.  We will follow him along.  Again, I believe that surgery is going be indicated in this situation.  It sounds like he has a very tight stricture according to the gastroenterology notes.  If he does have surgery, I know he will be ready for surgery.  He has a good attitude.  We had a very good prayer session.  He obviously has a lot of faith.  Lattie Haw, MD  Oswaldo Milian 41:10

## 2019-03-15 NOTE — Progress Notes (Signed)
[name] - Progress Note  WENDLE GENOVA [id number] DOB: 15-Feb-1941 DOA: 03/07/2019 PCP: Biagio Borg, MD   Brief narrative: HPI: Jonathan Lewis is a 78 y.o. male with medical history significant of GERD, hypertension, hyperglycemia not on any diabetic medication, acoustic neuroma, BPH. Admitted with complaints of persistent nausea vomiting and burping for the last 1 month. Patient had a televisit with his PCP on 1127 for hypoglycemia his blood sugar was 51-75 at home.  In addition he reported decreased appetite and lost 20 pounds in the last 1 month.  Denied hematochezia, melena, or hematemesis.  Due to significant reflux symptoms, burping and epigastric abdominal pain radiating up into the esophagus he presented to the ER for treatment.  Despite taking his usual Protonix and Pepcid his symptoms had not improved. He underwent CT scan of the abdomen (ordered by his PCP) 2 days prior to admission was ordered that showed gastric outlet obstruction from a possible mass.     ED Course:  CT of the abdomen and pelvis 03/06/2019 was reviewed: Marked distention of the stomach which is filled with food, fluid, and contrast material. This appearance is compatible with gastric outlet obstruction or severe dysmotility. There was a 2.0 x 1.7 cm nodular focus of soft tissue density in the region of the pylorus and while this area is difficult to assess by CT, features certainly raise concern for mass lesion with secondary gastric outlet obstruction.  No evidence for lymphadenopathy or metastatic disease in the abdomen/pelvis.Initial white count unremarkable.  Electrolyte panel unremarkable including normal renal function.  Subjective: Patient seen early this afternoon. He reports no longer having epigastric pain. He has had BM and denies abdominal cramp or discomfort.   Assessment/Plan: Active Problems: Gastric outlet obstruction secondary to severe erosive esophagitis with stricture-post EGD  with dilatation on 12/8 Presented as above Initial EGD on 12/4 revealed reflux esophagitis with gastric stenosis at the level of the pylorus and severe luminal narrowing stenting to at the duodenal bulb.  A large amount of retained food and fluid were noted and were evacuated during procedure He underwent a second EGD with dilatation on 12/8 that revealed: -LA Grade D esophagitis with no bleeding. - A large amount of food (residue) in the stomach. Suctioned via endoscope. - Congested, erythematous, friable (with contact bleeding), granular, hemorrhagic appearing, texture changed and increased vascular pattern mucosa in the antrum. Biopsied. - Gastric stenosis was found in the prepyloric region of the stomach. Biopsied. Then Dilated. - No gross lesions in the first portion of the duodenum and in the second portion of the duodenum. EUS Impression: - Wall thickening was seen in the pylorus. The thickening appeared to be primarily within the deep mucosa (Layer 2) and submucosa (Layer 3). Some area of more significant hyperechogenicity was noted, not clearly a muscle layer tumor, Recommendations were to continue clear liquid diet, IV PPI and H2 blocker Concerns for possible malignancy so biopsies were also obtained and final report from FNA collected during EUS confirming adenocarcinoma. Patient was made aware of this findings by Dr. Rush Landmark with GI on 12/10. Appreciate his recommendations.    The surgical team had signed off as of 12/8 but was consulted again on 12/11. Appreciate their recommendations. They will start him on TPN and plan on surgical intervention next week.   Dr. Marin Olp with Oncology consulted on 12/11. Appreciate recommendations. The patient will have imaging for staging, will need PET scan in the outpatient setting.  Hypertension  Blood pressure currently controlled with as needed IV hydralazine Continue to hold home lisinopril.  Diabetes mellitus 2 with  hyperglycemia/peripheral neuropathy Diet controlled at home Patient reported 20 pound weight loss prior to admission which is also likely influencing glycemic control  CBG has been well controlled since his presentation  Continue gabapentin elixir Continue to follow CBG and provide SSI    Malnutrition of moderate degree Estimated body mass index is 26.08 kg/m as calculated from the following:   Height as of this encounter: 5\' 8"  (1.727 m).   Weight as of this encounter: 77.8 kg.  Malnutrition Type:  Nutrition Problem: Moderate Malnutrition Etiology: acute illness Malnutrition Characteristics:  Signs/Symptoms: percent weight loss, moderate muscle depletion, mild fat depletion Percent weight loss: 9.8 % Nutrition Interventions:  Interventions: Prostat, Boost Breeze but now transitioning to TPN in preparation for surgery for gastric adenocarcinoma.   Dyslipidemia Not on pharmacological treatment prior to admission  Normocytic anemia.  Likely due to his malignancy. No active bleeding.  Hgb stable ~10. Anemia studies ordered per Oncology.    DVT prophylaxis: Lovenox [place] (started on 12/11)  Code Status:  Full  Family Communication:  Patient only  Disposition Plan/Expected LOS: Inpatient, patient presents with severe esophagitis with obstructive physiology and has recently undergone dilatation of same.  Had EUS with FNA showing malignant cells with diagnosis of gastric adenocarcinoma, likely localized. Will need surgical intervention during this hospitalization.   Consultants: General surgery-signed off 12/8, reconsulted on 12/11 Gastroenterology Oncology   Procedures: 12/4 EGD 12/8 EGD with dilatation  Cultures: SARS-CoV-2 negative  Antibiotics: Anti-infectives (From admission, onward)   None      Objective: Blood pressure 117/78, pulse 65, temperature 98.7 F (37.1 C), temperature source Oral, resp. rate 16, height 5\' 8"  (1.727 m), weight 77.8 kg, SpO2 100  %.  Intake/Output Summary (Last 24 hours) at 03/15/2019 1823 Last data filed at 03/15/2019 1730 Gross per 24 hour  Intake 3002.35 ml  Output --  Net 3002.35 ml     Exam: Gen: No acute respiratory distress Chest: No respiratory distress, no wheezing.  Cardiac: Regular rate and rhythm, S1-S2 Abdomen: Soft nontender nondistended, no ascites Extremities: Symmetrical in appearance without cyanosis, clubbing or effusion  Scheduled Meds:  Scheduled Meds: . Chlorhexidine Gluconate Cloth  6 each Topical Daily  . enoxaparin (LOVENOX) injection  40 mg Subcutaneous Q24H  . gabapentin  100 mg Oral Q12H  . insulin aspart  0-9 Units Subcutaneous Q6H  . pantoprazole (PROTONIX) IV  40 mg Intravenous Q12H  . sodium chloride (PF)      . sodium chloride flush  10-40 mL Intracatheter Q12H   Continuous Infusions: . sodium chloride Stopped (03/12/19 1900)  . dextrose 5 % and 0.9% NaCl    . potassium PHOSPHATE IVPB (in mmol) 10 mmol (03/15/19 1230)  . TPN ADULT (ION) 40 mL/hr at 03/15/19 1751    Data Reviewed: Basic Metabolic Panel: Recent Labs  Lab 03/09/19 0528 03/11/19 0515 03/13/19 0823 03/14/19 0555 03/15/19 0810  NA 140 139 140 140 140  K 3.8 3.9 3.7 3.7 3.7  CL 105 105 109 109 105  CO2 28 27 24 26 26   GLUCOSE 110* 90 115* 114* 121*  BUN 15 12 9 10 11   CREATININE 0.93 0.96 0.94 0.89 0.84  CALCIUM 8.4* 8.7* 8.2* 8.2* 8.4*  MG  --   --   --   --  1.9  PHOS  --   --   --   --  2.6   Liver Function Tests: Recent Labs  Lab 03/14/19 0555 03/15/19 0810  AST 15 15  ALT 11 12  ALKPHOS 36* 39  BILITOT 0.7 0.5  PROT 5.6* 5.4*  ALBUMIN 3.1* 2.9*   No results for input(s): LIPASE, AMYLASE in the last 168 hours. No results for input(s): AMMONIA in the last 168 hours. CBC: Recent Labs  Lab 03/09/19 0528 03/11/19 0515 03/13/19 0823 03/14/19 0555 03/15/19 0810  WBC 5.3 4.7 3.6* 3.8* 3.8*  HGB 11.4* 12.5* 11.0* 11.1* 10.9*  HCT 35.5* 38.8* 34.9* 34.7* 34.2*  MCV 87.2 88.0  88.4 88.3 88.1  PLT 178 192 174 172 183   Cardiac Enzymes: No results for input(s): CKTOTAL, CKMB, CKMBINDEX, TROPONINI in the last 168 hours. BNP (last 3 results) No results for input(s): BNP in the last 8760 hours.  ProBNP (last 3 results) No results for input(s): PROBNP in the last 8760 hours.  CBG: Recent Labs  Lab 03/14/19 1716 03/14/19 2015 03/15/19 0719 03/15/19 1143 03/15/19 1616  GLUCAP 77 109* 118* 99 85    Recent Results (from the past 240 hour(s))  SARS CORONAVIRUS 2 (TAT 6-24 HRS) Nasopharyngeal Nasopharyngeal Swab     Status: None   Collection Time: 03/07/19 10:01 AM   Specimen: Nasopharyngeal Swab  Result Value Ref Range Status   SARS Coronavirus 2 NEGATIVE NEGATIVE Final    Comment: (NOTE) SARS-CoV-2 target nucleic acids are NOT DETECTED. The SARS-CoV-2 RNA is generally detectable in upper and lower respiratory specimens during the acute phase of infection. Negative results do not preclude SARS-CoV-2 infection, do not rule out co-infections with other pathogens, and should not be used as the sole basis for treatment or other patient management decisions. Negative results must be combined with clinical observations, patient history, and epidemiological information. The expected result is Negative. Fact Sheet for Patients: SugarRoll.be Fact Sheet for Healthcare Providers: https://www.woods-mathews.com/ This test is not yet approved or cleared by the [place] FDA and  has been authorized for detection and/or diagnosis of SARS-CoV-2 by FDA under an Emergency Use Authorization (EUA). This EUA will remain  in effect (meaning this test can be used) for the duration of the COVID-19 declaration under Section 56 4(b)(1) of the Act, 21 U.S.C. section 360bbb-3(b)(1), unless the authorization is terminated or revoked sooner. Performed at [facility], [place] [address] [place], [place] [address]       Time  spent :  25 minutes  Blain Pais, MD [facility]  **If unable to reach the above provider after paging please contact the [place] [facility] @ [phone]  On-Call/Text Page:      [email or web address]      password [email or web address]  If 7PM-7AM, please contact night-coverage [email or web address] Password [email or web address] 03/15/2019, 6:23 PM   LOS: 8 days

## 2019-03-16 LAB — GLUCOSE, CAPILLARY
Glucose-Capillary: 103 mg/dL — ABNORMAL HIGH (ref 70–99)
Glucose-Capillary: 107 mg/dL — ABNORMAL HIGH (ref 70–99)
Glucose-Capillary: 112 mg/dL — ABNORMAL HIGH (ref 70–99)
Glucose-Capillary: 130 mg/dL — ABNORMAL HIGH (ref 70–99)
Glucose-Capillary: 147 mg/dL — ABNORMAL HIGH (ref 70–99)

## 2019-03-16 LAB — TRIGLYCERIDES: Triglycerides: 96 mg/dL (ref <150)

## 2019-03-16 LAB — DIFFERENTIAL
Abs Immature Granulocytes: 0.03 10*3/uL (ref 0.00–0.07)
Basophils Absolute: 0 10*3/uL (ref 0.0–0.1)
Basophils Relative: 1 %
Eosinophils Absolute: 0.1 10*3/uL (ref 0.0–0.5)
Eosinophils Relative: 2 %
Immature Granulocytes: 1 %
Lymphocytes Relative: 32 %
Lymphs Abs: 1.2 10*3/uL (ref 0.7–4.0)
Monocytes Absolute: 0.4 10*3/uL (ref 0.1–1.0)
Monocytes Relative: 11 %
Neutro Abs: 2 10*3/uL (ref 1.7–7.7)
Neutrophils Relative %: 53 %

## 2019-03-16 LAB — COMPREHENSIVE METABOLIC PANEL
ALT: 13 U/L (ref 0–44)
AST: 16 U/L (ref 15–41)
Albumin: 2.9 g/dL — ABNORMAL LOW (ref 3.5–5.0)
Alkaline Phosphatase: 40 U/L (ref 38–126)
Anion gap: 7 (ref 5–15)
BUN: 8 mg/dL (ref 8–23)
CO2: 27 mmol/L (ref 22–32)
Calcium: 8.5 mg/dL — ABNORMAL LOW (ref 8.9–10.3)
Chloride: 106 mmol/L (ref 98–111)
Creatinine, Ser: 0.8 mg/dL (ref 0.61–1.24)
GFR calc Af Amer: >60 mL/min (ref >60)
GFR calc non Af Amer: >60 mL/min (ref >60)
Glucose, Bld: 144 mg/dL — ABNORMAL HIGH (ref 70–99)
Potassium: 3.9 mmol/L (ref 3.5–5.1)
Sodium: 140 mmol/L (ref 135–145)
Total Bilirubin: 0.4 mg/dL (ref 0.3–1.2)
Total Protein: 5.6 g/dL — ABNORMAL LOW (ref 6.5–8.1)

## 2019-03-16 LAB — CBC
HCT: 33.7 % — ABNORMAL LOW (ref 39.0–52.0)
Hemoglobin: 11 g/dL — ABNORMAL LOW (ref 13.0–17.0)
MCH: 28.5 pg (ref 26.0–34.0)
MCHC: 32.6 g/dL (ref 30.0–36.0)
MCV: 87.3 fL (ref 80.0–100.0)
Platelets: 172 10*3/uL (ref 150–400)
RBC: 3.86 MIL/uL — ABNORMAL LOW (ref 4.22–5.81)
RDW: 12.4 % (ref 11.5–15.5)
WBC: 3.7 10*3/uL — ABNORMAL LOW (ref 4.0–10.5)
nRBC: 0 % (ref 0.0–0.2)

## 2019-03-16 LAB — IRON AND TIBC
Iron: 38 ug/dL — ABNORMAL LOW (ref 45–182)
Saturation Ratios: 15 % — ABNORMAL LOW (ref 17.9–39.5)
TIBC: 247 ug/dL — ABNORMAL LOW (ref 250–450)
UIBC: 209 ug/dL

## 2019-03-16 LAB — FOLATE: Folate: 15.2 ng/mL (ref >5.9)

## 2019-03-16 LAB — FERRITIN: Ferritin: 74 ng/mL (ref 24–336)

## 2019-03-16 LAB — PREALBUMIN: Prealbumin: 18 mg/dL (ref 18–38)

## 2019-03-16 LAB — VITAMIN B12: Vitamin B-12: 428 pg/mL (ref 180–914)

## 2019-03-16 LAB — PHOSPHORUS: Phosphorus: 2.9 mg/dL (ref 2.5–4.6)

## 2019-03-16 LAB — MAGNESIUM: Magnesium: 2 mg/dL (ref 1.7–2.4)

## 2019-03-16 MED ORDER — TRAVASOL 10 % IV SOLN
INTRAVENOUS | Status: AC
  Administered 2019-03-16: 18:00:00 via INTRAVENOUS
  Filled 2019-03-16: qty 676.8

## 2019-03-16 MED ORDER — MAGNESIUM SULFATE 2 GM/50ML IV SOLN
2.0000 g | Freq: Once | INTRAVENOUS | Status: AC
  Administered 2019-03-16: 2 g via INTRAVENOUS
  Filled 2019-03-16: qty 50

## 2019-03-16 MED ORDER — BOOST / RESOURCE BREEZE PO LIQD CUSTOM
1.0000 | Freq: Three times a day (TID) | ORAL | Status: DC
  Administered 2019-03-16 (×3): 1 via ORAL
  Administered 2019-03-17 (×2): 237 mL via ORAL
  Administered 2019-03-17 – 2019-03-18 (×4): 1 via ORAL

## 2019-03-16 MED ORDER — LATANOPROST 0.005 % OP SOLN
1.0000 [drp] | Freq: Every day | OPHTHALMIC | Status: DC
  Administered 2019-03-16 – 2019-04-03 (×19): 1 [drp] via OPHTHALMIC
  Filled 2019-03-16 (×3): qty 2.5

## 2019-03-16 MED ORDER — DEXTROSE-NACL 5-0.9 % IV SOLN
INTRAVENOUS | Status: DC
  Administered 2019-03-16: 18:00:00 via INTRAVENOUS

## 2019-03-16 MED ORDER — CYCLOSPORINE 0.05 % OP EMUL: 1.0000 [drp] | Freq: Two times a day (BID) | OPHTHALMIC | Status: DC | PRN

## 2019-03-16 NOTE — Progress Notes (Signed)
[name] - Progress Note  LARON BURGE [id number] DOB: [birth year] DOA: 03/07/2019 PCP: Biagio Borg, MD   Brief narrative: HPI: Jonathan Lewis is a 78 y.o. male with medical history significant of GERD, hypertension, hyperglycemia not on any diabetic medication, acoustic neuroma, BPH. Admitted with complaints of persistent nausea vomiting and burping for the last 1 month. Patient had a televisit with his PCP on 1127 for hypoglycemia his blood sugar was 51-75 at home.  In addition he reported decreased appetite and lost 20 pounds in the last 1 month.  Denied hematochezia, melena, or hematemesis.  Due to significant reflux symptoms, burping and epigastric abdominal pain radiating up into the esophagus he presented to the ER for treatment.  Despite taking his usual Protonix and Pepcid his symptoms had not improved. He underwent CT scan of the abdomen (ordered by his PCP) 2 days prior to admission was ordered that showed gastric outlet obstruction from a possible mass.     ED Course:  CT of the abdomen and pelvis 03/06/2019 was reviewed: Marked distention of the stomach which is filled with food, fluid, and contrast material. This appearance is compatible with gastric outlet obstruction or severe dysmotility. There was a 2.0 x 1.7 cm nodular focus of soft tissue density in the region of the pylorus and while this area is difficult to assess by CT, features certainly raise concern for mass lesion with secondary gastric outlet obstruction.  No evidence for lymphadenopathy or metastatic disease in the abdomen/pelvis.Initial white count unremarkable.  Electrolyte panel unremarkable including normal renal function.  Subjective: Patient seen early this afternoon. He reported having a "sensation" perhaps similar to burping in the epigastric area but this resolved on its own. He is tolerating TPN with no right arm edema, burning, or edema. Denies abdominal pain.   Assessment/Plan: Active  Problems: Gastric outlet obstruction secondary to severe erosive esophagitis with stricture-post EGD with dilatation on 12/8 Presented as above Initial EGD on 12/4 revealed reflux esophagitis with gastric stenosis at the level of the pylorus and severe luminal narrowing stenting to at the duodenal bulb.  A large amount of retained food and fluid were noted and were evacuated during procedure He underwent a second EGD with dilatation on 12/8 that revealed: -LA Grade D esophagitis with no bleeding. - A large amount of food (residue) in the stomach. Suctioned via endoscope. - Congested, erythematous, friable (with contact bleeding), granular, hemorrhagic appearing, texture changed and increased vascular pattern mucosa in the antrum. Biopsied. - Gastric stenosis was found in the prepyloric region of the stomach. Biopsied. Then Dilated. - No gross lesions in the first portion of the duodenum and in the second portion of the duodenum. EUS Impression: - Wall thickening was seen in the pylorus. The thickening appeared to be primarily within the deep mucosa (Layer 2) and submucosa (Layer 3). Some area of more significant hyperechogenicity was noted, not clearly a muscle layer tumor, -Final report from FNA collected during EUS confirming adenocarcinoma. Patient was made aware of this findings by Dr. Rush Landmark with GI on 12/10. Appreciate his recommendations.   -General Surgery is following. Appreciate their recommendations. They have started the patient on TPN and IV fluids with plan for surgical intervention in a few days.   Dr. Marin Olp with Oncology consulted on 12/11. Appreciate recommendations. The patient will have imaging for staging, will need PET scan in the outpatient setting.       Hypertension  Blood pressure currently controlled with as needed  IV hydralazine Continue to hold home lisinopril.  Diabetes mellitus 2 with hyperglycemia/peripheral neuropathy Diet controlled at home Patient  reported 20 pound weight loss prior to admission which is also likely influencing glycemic control  CBG has been well controlled since his presentation  Continue gabapentin elixir Continue to follow CBG and provide SSI    Malnutrition of moderate degree Estimated body mass index is 26.08 kg/m as calculated from the following:   Height as of this encounter: 5\' 8"  (1.727 m).   Weight as of this encounter: 77.8 kg.  Malnutrition Type:  Nutrition Problem: Moderate Malnutrition Etiology: acute illness Malnutrition Characteristics:  Signs/Symptoms: percent weight loss, moderate muscle depletion, mild fat depletion Percent weight loss: 9.8 % Nutrition Interventions:  Interventions: Prostat, Boost Breeze but now transitioning to TPN in preparation for surgery for gastric adenocarcinoma.   Dyslipidemia Not on pharmacological treatment prior to admission  Normocytic anemia.  Likely due to his malignancy. No active bleeding.  Hgb stable ~10. Anemia studies ordered per Oncology.    DVT prophylaxis: Lovenox [place] (started on 12/11)  Code Status:  Full  Family Communication:  Patient only  Disposition Plan/Expected LOS: Inpatient, patient presents with severe esophagitis with obstructive physiology and has recently undergone dilatation of same.  Had EUS with FNA showing malignant cells with diagnosis of gastric adenocarcinoma, likely localized. Will need surgical intervention during this hospitalization.   Consultants: General surgery-signed off 12/8, reconsulted on 12/11 Gastroenterology Oncology   Procedures: 12/4 EGD 12/8 EGD with dilatation  Cultures: SARS-CoV-2 negative  Antibiotics: Anti-infectives (From admission, onward)   None      Objective: Blood pressure 112/73, pulse 71, temperature 97.8 F (36.6 C), temperature source Oral, resp. rate 15, height 5\' 8"  (1.727 m), weight 77.8 kg, SpO2 99 %.  Intake/Output Summary (Last 24 hours) at 03/16/2019 1558 Last data  filed at 03/16/2019 1330 Gross per 24 hour  Intake 2692.29 ml  Output --  Net 2692.29 ml     Exam: Gen: No acute respiratory distress Chest: No respiratory distress, no wheezing.  Cardiac: Regular rate and rhythm, S1-S2 Abdomen: Soft nontender nondistended, no ascites Extremities: Symmetrical in appearance without cyanosis, clubbing or effusion  Scheduled Meds:  Scheduled Meds: . Chlorhexidine Gluconate Cloth  6 each Topical Daily  . enoxaparin (LOVENOX) injection  40 mg Subcutaneous Q24H  . feeding supplement  1 Container Oral TID BM  . gabapentin  100 mg Oral Q12H  . insulin aspart  0-9 Units Subcutaneous Q6H  . pantoprazole (PROTONIX) IV  40 mg Intravenous Q12H  . sodium chloride flush  10-40 mL Intracatheter Q12H   Continuous Infusions: . sodium chloride Stopped (03/12/19 1900)  . dextrose 5 % and 0.9% NaCl 30 mL/hr at 03/15/19 1838  . dextrose 5 % and 0.9% NaCl    . TPN ADULT (ION) 40 mL/hr at 03/15/19 1751  . TPN ADULT (ION)      Data Reviewed: Basic Metabolic Panel: Recent Labs  Lab 03/11/19 0515 03/13/19 0823 03/14/19 0555 03/15/19 0810 03/16/19 0541  NA 139 140 140 140 140  K 3.9 3.7 3.7 3.7 3.9  CL 105 109 109 105 106  CO2 27 24 26 26 27   GLUCOSE 90 115* 114* 121* 144*  BUN 12 9 10 11 8   CREATININE 0.96 0.94 0.89 0.84 0.80  CALCIUM 8.7* 8.2* 8.2* 8.4* 8.5*  MG  --   --   --  1.9 2.0  PHOS  --   --   --  2.6 2.9  Liver Function Tests: Recent Labs  Lab 03/14/19 0555 03/15/19 0810 03/16/19 0541  AST 15 15 16   ALT 11 12 13   ALKPHOS 36* 39 40  BILITOT 0.7 0.5 0.4  PROT 5.6* 5.4* 5.6*  ALBUMIN 3.1* 2.9* 2.9*   No results for input(s): LIPASE, AMYLASE in the last 168 hours. No results for input(s): AMMONIA in the last 168 hours. CBC: Recent Labs  Lab 03/11/19 0515 03/13/19 0823 03/14/19 0555 03/15/19 0810 03/16/19 0541  WBC 4.7 3.6* 3.8* 3.8* 3.7*  NEUTROABS  --   --   --   --  2.0  HGB 12.5* 11.0* 11.1* 10.9* 11.0*  HCT 38.8* 34.9*  34.7* 34.2* 33.7*  MCV 88.0 88.4 88.3 88.1 87.3  PLT 192 174 172 183 172   Cardiac Enzymes: No results for input(s): CKTOTAL, CKMB, CKMBINDEX, TROPONINI in the last 168 hours. BNP (last 3 results) No results for input(s): BNP in the last 8760 hours.  ProBNP (last 3 results) No results for input(s): PROBNP in the last 8760 hours.  CBG: Recent Labs  Lab 03/15/19 1143 03/15/19 1616 03/16/19 0004 03/16/19 0533 03/16/19 1213  GLUCAP 99 85 107* 147* 130*    Recent Results (from the past 240 hour(s))  SARS CORONAVIRUS 2 (TAT 6-24 HRS) Nasopharyngeal Nasopharyngeal Swab     Status: None   Collection Time: 03/07/19 10:01 AM   Specimen: Nasopharyngeal Swab  Result Value Ref Range Status   SARS Coronavirus 2 NEGATIVE NEGATIVE Final    Comment: (NOTE) SARS-CoV-2 target nucleic acids are NOT DETECTED. The SARS-CoV-2 RNA is generally detectable in upper and lower respiratory specimens during the acute phase of infection. Negative results do not preclude SARS-CoV-2 infection, do not rule out co-infections with other pathogens, and should not be used as the sole basis for treatment or other patient management decisions. Negative results must be combined with clinical observations, patient history, and epidemiological information. The expected result is Negative. Fact Sheet for Patients: SugarRoll.be Fact Sheet for Healthcare Providers: https://www.woods-mathews.com/ This test is not yet approved or cleared by the [place] FDA and  has been authorized for detection and/or diagnosis of SARS-CoV-2 by FDA under an Emergency Use Authorization (EUA). This EUA will remain  in effect (meaning this test can be used) for the duration of the COVID-19 declaration under Section 56 4(b)(1) of the Act, 21 U.S.C. section 360bbb-3(b)(1), unless the authorization is terminated or revoked sooner. Performed at [facility], [place] [address]  [place], [place] [address]       Time spent :  25 minutes  Blain Pais, MD [facility]  **If unable to reach the above provider after paging please contact the [facility] @ [phone]  On-Call/Text Page:      [email or web address]      password [email or web address]  If 7PM-7AM, please contact night-coverage [email or web address] Password [email or web address] 03/16/2019, 3:58 PM   LOS: 9 days

## 2019-03-16 NOTE — Progress Notes (Signed)
Progress Note: General Surgery Service   Chief Complaint/Subjective: No issues overnight, tolerating some liquids, frequent urination overnight  Objective: Vital signs in last 24 hours: Temp:  [98.7 F (37.1 C)-100 F (37.8 C)] 99.5 F (37.5 C) (12/12 0536) Pulse Rate:  [65-80] 72 (12/12 0536) Resp:  [16-18] 16 (12/12 0536) BP: (93-117)/(68-78) 93/68 (12/12 0536) SpO2:  [100 %] 100 % (12/12 0536) Weight:  [77.8 kg] 77.8 kg (12/11 0900) Last BM Date: 03/14/19  Intake/Output from previous day: 12/11 0701 - 12/12 0700 In: 3129.6 [P.O.:1080; I.V.:1741.1; IV Piggyback:308.5] Out: -  Intake/Output this shift: No intake/output data recorded.  Gen: NAD  Resp: nonlabored  Card: regular rate  Abd: soft, NT, ND  Lab Results: CBC  Recent Labs    03/15/19 0810 03/16/19 0541  WBC 3.8* 3.7*  HGB 10.9* 11.0*  HCT 34.2* 33.7*  PLT 183 172   BMET Recent Labs    03/15/19 0810 03/16/19 0541  NA 140 140  K 3.7 3.9  CL 105 106  CO2 26 27  GLUCOSE 121* 144*  BUN 11 8  CREATININE 0.84 0.80  CALCIUM 8.4* 8.5*   PT/INR No results for input(s): LABPROT, INR in the last 72 hours. ABG No results for input(s): PHART, HCO3 in the last 72 hours.  Invalid input(s): PCO2, PO2  Anti-infectives: Anti-infectives (From admission, onward)   None      Medications: Scheduled Meds: . Chlorhexidine Gluconate Cloth  6 each Topical Daily  . enoxaparin (LOVENOX) injection  40 mg Subcutaneous Q24H  . feeding supplement  1 Container Oral TID BM  . gabapentin  100 mg Oral Q12H  . insulin aspart  0-9 Units Subcutaneous Q6H  . pantoprazole (PROTONIX) IV  40 mg Intravenous Q12H  . sodium chloride flush  10-40 mL Intracatheter Q12H   Continuous Infusions: . sodium chloride Stopped (03/12/19 1900)  . dextrose 5 % and 0.9% NaCl 30 mL/hr at 03/15/19 1838  . TPN ADULT (ION) 40 mL/hr at 03/15/19 1751   PRN Meds:.hydrALAZINE, magnesium hydroxide, sodium chloride  flush  Assessment/Plan: s/p Procedure(s): UPPER ESOPHAGEAL ENDOSCOPIC ULTRASOUND (EUS) ESOPHAGOGASTRODUODENOSCOPY (EGD) WITH PROPOFOL BIOPSY FINE NEEDLE ASPIRATION (FNA) LINEAR BALLOON DILATION 03/12/2019 2nd set of biopsies showed adenocarcinoma of stomach. It appears to be involving just the distal stomach. Given the level of narrowing this will likely need surgery during this admission. CT chest without large masses/lymphadenopathy. -TPN and liquids for the weekend -possible surgery next week    LOS: 9 days   Mickeal Skinner, MD [facility]

## 2019-03-16 NOTE — Progress Notes (Signed)
PHARMACY - TOTAL PARENTERAL NUTRITION CONSULT NOTE   Indication: GOO  Patient Measurements: Height: 5\' 8"  (172.7 cm) Weight: 171 lb 8.3 oz (77.8 kg) IBW/kg (Calculated) : 68.4 TPN AdjBW (KG): 77.8 Body mass index is 26.08 kg/m. Usual Weight:   Assessment:  Patient is a 78 y.o M presented to ED on 12/3 with c/o recurrent emesis and abdominal discomfort.  Abdominal CT on 12/2 showed findings consistent with "outlet obstruction or severe dysmotility" and suspected gastric mass. He underwent EGD with biopsy on 12/4 and repeat EUS/EGD with dilation/biopsy on 12/8.  Biopsy done on 12/8 came back with evidence of gastric adenocarcinoma.  CCS recom. surgery with this admssion and to start TPN on 12/11.   Glucose / Insulin: sensitive SSI q6h; cbgs <150, 1 unit of Novolog in past 24 hours  Electrolytes: K 3.9, phos 2.9, Magnesium 2; CorrCa 9.4; Cl and CO2 wnl Renal: scr wnl LFTs / TGs: - AST/ALT wnl - TG 96 ( 12/12)  Prealbumin / albumin:  - albumin 2.9 (12/11) - prealbumin 18 (12/11) Intake / Output; MIVF: I/O + 3129;  D5 NS at 30 ml/hr GI Imaging: Surgeries / Procedures:  - 12/4 EGD: LA grade D esophagitis, severe stenosis was found at the pylorus, large amount of food residue in stomach, biopsies taken - 12/7: biopsies negative for cancer - 12/8: EUS and EGD with dilation (severe pylorus stenosis- biopsied and dilated) - 12/10: pylorus biopsy showed evidence of gastric adenocarcinoma  Central access: 12/11 PICC   TPN start date: 12/11   Nutritional Goals (per RD recommendation on 12/11): Kcal:  1880-2110 kcal Protein:  90-100 grams Fluid:  >/= 2 L/day  Goal TPN rate is 85 mL/hr (provides 96 g of protein and 2045 kcals per day)  Current Nutrition: NPO starting on 12/11 - Boost tid started on 12/4>> 12/11, 12/12 >>  - pro-stat tid started on 12/4>> 12/11  Plan:   Now - magnesium sulfate 2gm IV x1     Increase TPN to 60 mL/hr at 1800  Electrolytes in TPN: 47mEq/L of  Na, 60 mEq/L of K, 64mEq/L of Ca, 8 mEq/L of Mg, and 33mmol/L of Phos. Cl:Ac ratio 1:1  Add to TPN MWF (due to backorder status). Add trace elements to TPN   Continue famotidine in TPN  Continue sSSI q6h and adjust as needed   Reduce MIVF to 10 mL/hr at 1800  BMET, magnesium, phosphorus with AM labs   TPN labs on Mon/Thurs   Royetta Asal, PharmD, BCPS 03/16/2019 9:15 AM

## 2019-03-17 LAB — GLUCOSE, CAPILLARY
Glucose-Capillary: 104 mg/dL — ABNORMAL HIGH (ref 70–99)
Glucose-Capillary: 106 mg/dL — ABNORMAL HIGH (ref 70–99)
Glucose-Capillary: 129 mg/dL — ABNORMAL HIGH (ref 70–99)
Glucose-Capillary: 144 mg/dL — ABNORMAL HIGH (ref 70–99)

## 2019-03-17 LAB — BASIC METABOLIC PANEL
Anion gap: 6 (ref 5–15)
BUN: 9 mg/dL (ref 8–23)
CO2: 28 mmol/L (ref 22–32)
Calcium: 8.7 mg/dL — ABNORMAL LOW (ref 8.9–10.3)
Chloride: 106 mmol/L (ref 98–111)
Creatinine, Ser: 0.94 mg/dL (ref 0.61–1.24)
GFR calc Af Amer: >60 mL/min (ref >60)
GFR calc non Af Amer: >60 mL/min (ref >60)
Glucose, Bld: 136 mg/dL — ABNORMAL HIGH (ref 70–99)
Potassium: 4.4 mmol/L (ref 3.5–5.1)
Sodium: 140 mmol/L (ref 135–145)

## 2019-03-17 LAB — MAGNESIUM: Magnesium: 2.1 mg/dL (ref 1.7–2.4)

## 2019-03-17 LAB — PHOSPHORUS: Phosphorus: 3.2 mg/dL (ref 2.5–4.6)

## 2019-03-17 MED ORDER — TRAVASOL 10 % IV SOLN
INTRAVENOUS | Status: AC
  Administered 2019-03-17: 18:00:00 via INTRAVENOUS
  Filled 2019-03-17: qty 958.8

## 2019-03-17 NOTE — Progress Notes (Signed)
[facility] APP - Progress Note  Jonathan Lewis [id number] DOB: 12-Sep-1940 DOA: 03/07/2019 PCP: Biagio Borg, MD   Brief narrative: HPI: Jonathan Lewis is a 78 y.o. male with medical history significant of GERD, hypertension, hyperglycemia not on any diabetic medication, acoustic neuroma, BPH. Admitted with complaints of persistent nausea vomiting and burping for the last 1 month. Patient had a televisit with his PCP on 1127 for hypoglycemia his blood sugar was 51-75 at home.  In addition he reported decreased appetite and lost 20 pounds in the last 1 month.  Denied hematochezia, melena, or hematemesis.  Due to significant reflux symptoms, burping and epigastric abdominal pain radiating up into the esophagus he presented to the ER for treatment.  Despite taking his usual Protonix and Pepcid his symptoms had not improved. He underwent CT scan of the abdomen (ordered by his PCP) 2 days prior to admission was ordered that showed gastric outlet obstruction from a possible mass.     ED Course:  CT of the abdomen and pelvis 03/06/2019 was reviewed: Marked distention of the stomach which is filled with food, fluid, and contrast material. This appearance is compatible with gastric outlet obstruction or severe dysmotility. There was a 2.0 x 1.7 cm nodular focus of soft tissue density in the region of the pylorus and while this area is difficult to assess by CT, features certainly raise concern for mass lesion with secondary gastric outlet obstruction.  No evidence for lymphadenopathy or metastatic disease in the abdomen/pelvis.Initial white count unremarkable.  Electrolyte panel unremarkable including normal renal function.  Subjective: Patient reports no abdominal pain. Tolerating clear liquid diet well. His eye drops were started last night, no reported recent vision changes.   Assessment/Plan: Active Problems: Gastric outlet obstruction secondary to severe erosive esophagitis with stricture-post  EGD with dilatation on 12/8 Presented as above Initial EGD on 12/4 revealed reflux esophagitis with gastric stenosis at the level of the pylorus and severe luminal narrowing stenting to at the duodenal bulb.  A large amount of retained food and fluid were noted and were evacuated during procedure He underwent a second EGD with dilatation on 12/8 that revealed: -LA Grade D esophagitis with no bleeding. - A large amount of food (residue) in the stomach. Suctioned via endoscope. - Congested, erythematous, friable (with contact bleeding), granular, hemorrhagic appearing, texture changed and increased vascular pattern mucosa in the antrum. Biopsied. - Gastric stenosis was found in the prepyloric region of the stomach. Biopsied. Then Dilated. - No gross lesions in the first portion of the duodenum and in the second portion of the duodenum. EUS Impression: - Wall thickening was seen in the pylorus. The thickening appeared to be primarily within the deep mucosa (Layer 2) and submucosa (Layer 3). Some area of more significant hyperechogenicity was noted, not clearly a muscle layer tumor, -Final report from FNA collected during EUS confirming adenocarcinoma. Patient was made aware of this findings by Dr. Rush Landmark with GI on 12/10. Appreciate his recommendations.   -General Surgery is following. Appreciate their recommendations. They have started the patient on TPN and IV fluids with plan for surgical intervention in a few days.   Dr. Marin Olp with Oncology consulted on 12/11. Appreciate recommendations. The patient will have imaging for staging, will need PET scan in the outpatient setting.       Hypertension  Blood pressure currently controlled with as needed IV hydralazine Continue to hold home lisinopril.  Diabetes mellitus 2 with hyperglycemia/peripheral neuropathy Diet controlled at  home Patient reported 20 pound weight loss prior to admission which is also likely influencing glycemic  control  CBG has been well controlled since his presentation  Continue gabapentin elixir Continue to follow CBG and provide SSI    Malnutrition of moderate degree Estimated body mass index is 26.08 kg/m as calculated from the following:   Height as of this encounter: 5\' 8"  (1.727 m).   Weight as of this encounter: 77.8 kg.  Malnutrition Type:  Nutrition Problem: Moderate Malnutrition Etiology: acute illness Malnutrition Characteristics:  Signs/Symptoms: percent weight loss, moderate muscle depletion, mild fat depletion Percent weight loss: 9.8 % Nutrition Interventions:  Interventions: Prostat, Boost Breeze but now transitioning to TPN in preparation for surgery for gastric adenocarcinoma.   Dyslipidemia Not on pharmacological treatment prior to admission  Normocytic anemia.  Likely due to his malignancy. No active bleeding.  Hgb stable ~10. Anemia studies ordered per Oncology.   Glaucoma.  Stable. Continue his home eye drops.    DVT prophylaxis: Lovenox [place]   Code Status:  Full  Family Communication:  Patient only  Disposition Plan/Expected LOS: Inpatient, patient presents with severe esophagitis with obstructive physiology and has recently undergone dilatation of same.  Had EUS with FNA showing malignant cells with diagnosis of gastric adenocarcinoma, likely localized. Will need surgical intervention during this hospitalization.   Consultants: General surgery-signed off 12/8, reconsulted on 12/11 Gastroenterology Oncology   Procedures: 12/4 EGD 12/8 EGD with dilatation  Cultures: SARS-CoV-2 negative  Antibiotics: Anti-infectives (From admission, onward)   None      Objective: Blood pressure 100/72, pulse 79, temperature 98.1 F (36.7 C), temperature source Oral, resp. rate 14, height 5\' 8"  (1.727 m), weight 77.8 kg, SpO2 94 %.  Intake/Output Summary (Last 24 hours) at 03/17/2019 1551 Last data filed at 03/17/2019 1200 Gross per 24 hour  Intake  1971.02 ml  Output --  Net 1971.02 ml     Exam: Gen: No acute respiratory distress Chest: No respiratory distress, no wheezing.  Cardiac: Regular rate and rhythm, S1-S2 Abdomen: Soft nontender nondistended, no ascites Extremities: Symmetrical in appearance without cyanosis, clubbing or effusion  Scheduled Meds:  Scheduled Meds: . Chlorhexidine Gluconate Cloth  6 each Topical Daily  . enoxaparin (LOVENOX) injection  40 mg Subcutaneous Q24H  . feeding supplement  1 Container Oral TID BM  . gabapentin  100 mg Oral Q12H  . insulin aspart  0-9 Units Subcutaneous Q6H  . latanoprost  1 drop Both Eyes QHS  . pantoprazole (PROTONIX) IV  40 mg Intravenous Q12H  . sodium chloride flush  10-40 mL Intracatheter Q12H   Continuous Infusions: . sodium chloride Stopped (03/12/19 1900)  . dextrose 5 % and 0.9% NaCl 10 mL/hr at 03/16/19 1822  . TPN ADULT (ION) 60 mL/hr at 03/16/19 1806  . TPN ADULT (ION)      Data Reviewed: Basic Metabolic Panel: Recent Labs  Lab 03/13/19 0823 03/14/19 0555 03/15/19 0810 03/16/19 0541 03/17/19 0517  NA 140 140 140 140 140  K 3.7 3.7 3.7 3.9 4.4  CL 109 109 105 106 106  CO2 24 26 26 27 28   GLUCOSE 115* 114* 121* 144* 136*  BUN 9 10 11 8 9   CREATININE 0.94 0.89 0.84 0.80 0.94  CALCIUM 8.2* 8.2* 8.4* 8.5* 8.7*  MG  --   --  1.9 2.0 2.1  PHOS  --   --  2.6 2.9 3.2   Liver Function Tests: Recent Labs  Lab 03/14/19 0555 03/15/19 0810 03/16/19 0541  AST 15 15 16   ALT 11 12 13   ALKPHOS 36* 39 40  BILITOT 0.7 0.5 0.4  PROT 5.6* 5.4* 5.6*  ALBUMIN 3.1* 2.9* 2.9*   No results for input(s): LIPASE, AMYLASE in the last 168 hours. No results for input(s): AMMONIA in the last 168 hours. CBC: Recent Labs  Lab 03/11/19 0515 03/13/19 0823 03/14/19 0555 03/15/19 0810 03/16/19 0541  WBC 4.7 3.6* 3.8* 3.8* 3.7*  NEUTROABS  --   --   --   --  2.0  HGB 12.5* 11.0* 11.1* 10.9* 11.0*  HCT 38.8* 34.9* 34.7* 34.2* 33.7*  MCV 88.0 88.4 88.3 88.1 87.3   PLT 192 174 172 183 172   Cardiac Enzymes: No results for input(s): CKTOTAL, CKMB, CKMBINDEX, TROPONINI in the last 168 hours. BNP (last 3 results) No results for input(s): BNP in the last 8760 hours.  ProBNP (last 3 results) No results for input(s): PROBNP in the last 8760 hours.  CBG: Recent Labs  Lab 03/16/19 1213 03/16/19 1811 03/16/19 2321 03/17/19 0613 03/17/19 1207  GLUCAP 130* 112* 103* 144* 106*    No results found for this or any previous visit (from the past 240 hour(s)).    Time spent :  15 minutes  Blain Pais, MD [facility]  **If unable to reach the above provider after paging please contact the [place] @ [phone]  On-Call/Text Page:      [email or web address]      password [email or web address]  If 7PM-7AM, please contact night-coverage [email or web address] Password [email or web address] 03/17/2019, 3:51 PM   LOS: 10 days

## 2019-03-17 NOTE — Progress Notes (Signed)
PHARMACY - TOTAL PARENTERAL NUTRITION CONSULT NOTE   Indication: GOO  Patient Measurements: Height: 5\' 8"  (172.7 cm) Weight: 171 lb 8.3 oz (77.8 kg) IBW/kg (Calculated) : 68.4 TPN AdjBW (KG): 77.8 Body mass index is 26.08 kg/m. Usual Weight:   Assessment:  Patient is a 78 y.o M presented to ED on 12/3 with c/o recurrent emesis and abdominal discomfort.  Abdominal CT on 12/2 showed findings consistent with "outlet obstruction or severe dysmotility" and suspected gastric mass. He underwent EGD with biopsy on 12/4 and repeat EUS/EGD with dilation/biopsy on 12/8.  Biopsy done on 12/8 came back with evidence of gastric adenocarcinoma.  CCS recom. surgery with this admssion and to start TPN on 12/11.   Glucose / Insulin: sensitive SSI q6h; cbgs <150, 1 unit of Novolog in past 24 hours  Electrolytes: electrolytes are WNL; Cl and CO2 wnl Renal: scr wnl LFTs / TGs: - AST/ALT wnl - TG 96 ( 12/12)  Prealbumin / albumin:  - albumin 2.9 (12/11) - prealbumin 18 (12/11) Intake / Output; MIVF: I/O + 1250;  D5 NS at 10 ml/hr GI Imaging: Surgeries / Procedures:  - 12/4 EGD: LA grade D esophagitis, severe stenosis was found at the pylorus, large amount of food residue in stomach, biopsies taken - 12/7: biopsies negative for cancer - 12/8: EUS and EGD with dilation (severe pylorus stenosis- biopsied and dilated) - 12/10: pylorus biopsy showed evidence of gastric adenocarcinoma  Central access: 12/11 PICC   TPN start date: 12/11   Nutritional Goals (per RD recommendation on 12/11): Kcal:  1880-2110 kcal Protein:  90-100 grams Fluid:  >/= 2 L/day  Goal TPN rate is 85 mL/hr (provides 96 g of protein and 2045 kcals per day)  Current Nutrition: NPO starting on 12/11 - Boost tid started on 12/4>> 12/11, 12/12 >>  - pro-stat tid started on 12/4>> 12/11  Plan:   Increase TPN to 85 mL/hr at 1800, the target rate   Electrolytes in TPN: 88mEq/L of Na, 55 mEq/L of K, 3mEq/L of Ca, 8 mEq/L of  Mg, and 55mmol/L of Phos. Cl:Ac ratio 1:1  Add to TPN MWF (due to backorder status). Add trace elements to TPN   Continue famotidine in TPN  Continue sSSI q6h and adjust as needed   Continue MIVF to 10 mL/hr at 1800  TPN labs on Mon/Thurs   Royetta Asal, PharmD, BCPS 03/17/2019 8:35 AM

## 2019-03-18 LAB — DIFFERENTIAL
Abs Immature Granulocytes: 0.01 10*3/uL (ref 0.00–0.07)
Basophils Absolute: 0 10*3/uL (ref 0.0–0.1)
Basophils Relative: 1 %
Eosinophils Absolute: 0.1 10*3/uL (ref 0.0–0.5)
Eosinophils Relative: 3 %
Immature Granulocytes: 0 %
Lymphocytes Relative: 39 %
Lymphs Abs: 1.4 10*3/uL (ref 0.7–4.0)
Monocytes Absolute: 0.4 10*3/uL (ref 0.1–1.0)
Monocytes Relative: 12 %
Neutro Abs: 1.6 10*3/uL — ABNORMAL LOW (ref 1.7–7.7)
Neutrophils Relative %: 45 %

## 2019-03-18 LAB — PREALBUMIN: Prealbumin: 20.1 mg/dL (ref 18–38)

## 2019-03-18 LAB — TRIGLYCERIDES: Triglycerides: 105 mg/dL (ref <150)

## 2019-03-18 LAB — COMPREHENSIVE METABOLIC PANEL
ALT: 13 U/L (ref 0–44)
AST: 14 U/L — ABNORMAL LOW (ref 15–41)
Albumin: 3.2 g/dL — ABNORMAL LOW (ref 3.5–5.0)
Alkaline Phosphatase: 40 U/L (ref 38–126)
Anion gap: 5 (ref 5–15)
BUN: 14 mg/dL (ref 8–23)
CO2: 27 mmol/L (ref 22–32)
Calcium: 8.7 mg/dL — ABNORMAL LOW (ref 8.9–10.3)
Chloride: 107 mmol/L (ref 98–111)
Creatinine, Ser: 0.88 mg/dL (ref 0.61–1.24)
GFR calc Af Amer: >60 mL/min (ref >60)
GFR calc non Af Amer: >60 mL/min (ref >60)
Glucose, Bld: 149 mg/dL — ABNORMAL HIGH (ref 70–99)
Potassium: 4.4 mmol/L (ref 3.5–5.1)
Sodium: 139 mmol/L (ref 135–145)
Total Bilirubin: 0.4 mg/dL (ref 0.3–1.2)
Total Protein: 5.8 g/dL — ABNORMAL LOW (ref 6.5–8.1)

## 2019-03-18 LAB — CBC
HCT: 34.6 % — ABNORMAL LOW (ref 39.0–52.0)
Hemoglobin: 11.1 g/dL — ABNORMAL LOW (ref 13.0–17.0)
MCH: 27.9 pg (ref 26.0–34.0)
MCHC: 32.1 g/dL (ref 30.0–36.0)
MCV: 86.9 fL (ref 80.0–100.0)
Platelets: 164 10*3/uL (ref 150–400)
RBC: 3.98 MIL/uL — ABNORMAL LOW (ref 4.22–5.81)
RDW: 12.1 % (ref 11.5–15.5)
WBC: 3.6 10*3/uL — ABNORMAL LOW (ref 4.0–10.5)
nRBC: 0 % (ref 0.0–0.2)

## 2019-03-18 LAB — GLUCOSE, CAPILLARY
Glucose-Capillary: 141 mg/dL — ABNORMAL HIGH (ref 70–99)
Glucose-Capillary: 123 mg/dL — ABNORMAL HIGH (ref 70–99)
Glucose-Capillary: 88 mg/dL (ref 70–99)

## 2019-03-18 LAB — MAGNESIUM: Magnesium: 2.1 mg/dL (ref 1.7–2.4)

## 2019-03-18 LAB — PHOSPHORUS: Phosphorus: 3.7 mg/dL (ref 2.5–4.6)

## 2019-03-18 MED ORDER — SODIUM CHLORIDE 0.9 % IV SOLN
2.0000 g | INTRAVENOUS | Status: AC
  Administered 2019-03-19: 2 g via INTRAVENOUS
  Filled 2019-03-18: qty 2

## 2019-03-18 MED ORDER — TRAVASOL 10 % IV SOLN
INTRAVENOUS | Status: AC
  Administered 2019-03-18: 17:00:00 via INTRAVENOUS
  Filled 2019-03-18: qty 958.8

## 2019-03-18 NOTE — Progress Notes (Signed)
[name] APP - Progress Note  ANDREZ SPIVA [id number] DOB: 01/02/1941 DOA: 03/07/2019 PCP: Biagio Borg, MD   Brief narrative: HPI: Jonathan Lewis is a 78 y.o. male with medical history significant of GERD, hypertension, hyperglycemia not on any diabetic medication, acoustic neuroma, BPH. Admitted with complaints of persistent nausea vomiting and burping for the last 1 month. Patient had a televisit with his PCP on 1127 for hypoglycemia his blood sugar was 51-75 at home.  In addition he reported decreased appetite and lost 20 pounds in the last 1 month.  Denied hematochezia, melena, or hematemesis.  Due to significant reflux symptoms, burping and epigastric abdominal pain radiating up into the esophagus he presented to the ER for treatment.  Despite taking his usual Protonix and Pepcid his symptoms had not improved. He underwent CT scan of the abdomen (ordered by his PCP) 2 days prior to admission was ordered that showed gastric outlet obstruction from a possible mass.     ED Course:  CT of the abdomen and pelvis 03/06/2019 was reviewed: Marked distention of the stomach which is filled with food, fluid, and contrast material. This appearance is compatible with gastric outlet obstruction or severe dysmotility. There was a 2.0 x 1.7 cm nodular focus of soft tissue density in the region of the pylorus and while this area is difficult to assess by CT, features certainly raise concern for mass lesion with secondary gastric outlet obstruction.  No evidence for lymphadenopathy or metastatic disease in the abdomen/pelvis.Initial white count unremarkable.  Electrolyte panel unremarkable including normal renal function.  Subjective: Patient seen and examined this afternoon. Reported no abdominal pain. Denies diarrhea. Tolerating clear liquid diet and TPN.    Assessment/Plan: Active Problems: Gastric outlet obstruction secondary to severe erosive esophagitis with stricture-post EGD with  dilatation on 12/8 Presented as above Initial EGD on 12/4 revealed reflux esophagitis with gastric stenosis at the level of the pylorus and severe luminal narrowing stenting to at the duodenal bulb.  A large amount of retained food and fluid were noted and were evacuated during procedure He underwent a second EGD with dilatation on 12/8 that revealed: -LA Grade D esophagitis with no bleeding. - A large amount of food (residue) in the stomach. Suctioned via endoscope. - Congested, erythematous, friable (with contact bleeding), granular, hemorrhagic appearing, texture changed and increased vascular pattern mucosa in the antrum. Biopsied. - Gastric stenosis was found in the prepyloric region of the stomach. Biopsied. Then Dilated. - No gross lesions in the first portion of the duodenum and in the second portion of the duodenum. EUS Impression: - Wall thickening was seen in the pylorus. The thickening appeared to be primarily within the deep mucosa (Layer 2) and submucosa (Layer 3). Some area of more significant hyperechogenicity was noted, not clearly a muscle layer tumor, -Final report from FNA collected during EUS confirming adenocarcinoma. Patient was made aware of this findings by Dr. Rush Landmark with GI on 12/10. Appreciate his recommendations.   -General Surgery is following. Appreciate their recommendations. They have started the patient on TPN and IV fluids with plan for surgical intervention in a few days.   Dr. Marin Olp with Oncology consulted on 12/11. Appreciate recommendations. The patient will have imaging for staging, will need PET scan in the outpatient setting.       Hypertension  Blood pressure currently controlled with as needed IV hydralazine Continue to hold home lisinopril.  Diabetes mellitus 2 with hyperglycemia/peripheral neuropathy Diet controlled at home Patient reported  20 pound weight loss prior to admission which is also likely influencing glycemic control  CBG  has been well controlled since his presentation  Continue gabapentin elixir Continue to follow CBG and provide SSI    Malnutrition of moderate degree Estimated body mass index is 26.08 kg/m as calculated from the following:   Height as of this encounter: 5\' 8"  (1.727 m).   Weight as of this encounter: 77.8 kg.  Malnutrition Type:  Nutrition Problem: Moderate Malnutrition Etiology: acute illness Malnutrition Characteristics:  Signs/Symptoms: percent weight loss, moderate muscle depletion, mild fat depletion Percent weight loss: 9.8 % Nutrition Interventions:  Interventions: Prostat, Boost Breeze but now transitioning to TPN in preparation for surgery for gastric adenocarcinoma.   Dyslipidemia Not on pharmacological treatment prior to admission  Normocytic anemia.  Likely due to his malignancy. No active bleeding.  Hgb stable ~10. Anemia studies ordered per Oncology.   Glaucoma.  Stable. Continue his home eye drops.    DVT prophylaxis: Lovenox [place]   Code Status:  Full  Family Communication:  Patient only  Disposition Plan/Expected LOS: Inpatient, patient presents with severe esophagitis with obstructive physiology and has recently undergone dilatation of same.  Had EUS with FNA showing malignant cells with diagnosis of gastric adenocarcinoma, likely localized. Will need surgical intervention during this hospitalization.   Consultants: General surgery-signed off 12/8, reconsulted on 12/11 Gastroenterology Oncology   Procedures: 12/4 EGD 12/8 EGD with dilatation  Cultures: SARS-CoV-2 negative  Antibiotics: Anti-infectives (From admission, onward)   Start     Dose/Rate Route Frequency Ordered Stop   03/19/19 0600  cefoTEtan (CEFOTAN) 2 g in sodium chloride 0.9 % 100 mL IVPB     2 g 200 mL/hr over 30 Minutes Intravenous On call to O.R. 03/18/19 1448 03/20/19 0559      Objective: Blood pressure 104/72, pulse 77, temperature 97.7 F (36.5 C), temperature  source Oral, resp. rate 18, height 5\' 8"  (1.727 m), weight 77.8 kg, SpO2 100 %.  Intake/Output Summary (Last 24 hours) at 03/18/2019 2054 Last data filed at 03/18/2019 1123 Gross per 24 hour  Intake 360 ml  Output --  Net 360 ml     Exam: Gen: No acute respiratory distress Chest: No respiratory distress, no wheezing.  Cardiac: Regular rate and rhythm, S1-S2 Abdomen: Soft nontender nondistended, no ascites Extremities: Symmetrical in appearance without cyanosis, clubbing or effusion  Scheduled Meds:  Scheduled Meds: . Chlorhexidine Gluconate Cloth  6 each Topical Daily  . enoxaparin (LOVENOX) injection  40 mg Subcutaneous Q24H  . feeding supplement  1 Container Oral TID BM  . gabapentin  100 mg Oral Q12H  . insulin aspart  0-9 Units Subcutaneous Q6H  . latanoprost  1 drop Both Eyes QHS  . pantoprazole (PROTONIX) IV  40 mg Intravenous Q12H  . sodium chloride flush  10-40 mL Intracatheter Q12H   Continuous Infusions: . sodium chloride Stopped (03/12/19 1900)  . [START ON 03/19/2019] cefoTEtan (CEFOTAN) IV    . dextrose 5 % and 0.9% NaCl 10 mL/hr at 03/16/19 1822  . TPN ADULT (ION) 85 mL/hr at 03/18/19 1705    Data Reviewed: Basic Metabolic Panel: Recent Labs  Lab 03/14/19 0555 03/15/19 0810 03/16/19 0541 03/17/19 0517 03/18/19 0457  NA 140 140 140 140 139  K 3.7 3.7 3.9 4.4 4.4  CL 109 105 106 106 107  CO2 26 26 27 28 27   GLUCOSE 114* 121* 144* 136* 149*  BUN 10 11 8 9 14   CREATININE 0.89 0.84 0.80 0.94  0.88  CALCIUM 8.2* 8.4* 8.5* 8.7* 8.7*  MG  --  1.9 2.0 2.1 2.1  PHOS  --  2.6 2.9 3.2 3.7   Liver Function Tests: Recent Labs  Lab 03/14/19 0555 03/15/19 0810 03/16/19 0541 03/18/19 0457  AST 15 15 16  14*  ALT 11 12 13 13   ALKPHOS 36* 39 40 40  BILITOT 0.7 0.5 0.4 0.4  PROT 5.6* 5.4* 5.6* 5.8*  ALBUMIN 3.1* 2.9* 2.9* 3.2*   No results for input(s): LIPASE, AMYLASE in the last 168 hours. No results for input(s): AMMONIA in the last 168  hours. CBC: Recent Labs  Lab 03/13/19 0823 03/14/19 0555 03/15/19 0810 03/16/19 0541 03/18/19 0457  WBC 3.6* 3.8* 3.8* 3.7* 3.6*  NEUTROABS  --   --   --  2.0 1.6*  HGB 11.0* 11.1* 10.9* 11.0* 11.1*  HCT 34.9* 34.7* 34.2* 33.7* 34.6*  MCV 88.4 88.3 88.1 87.3 86.9  PLT 174 172 183 172 164   Cardiac Enzymes: No results for input(s): CKTOTAL, CKMB, CKMBINDEX, TROPONINI in the last 168 hours. BNP (last 3 results) No results for input(s): BNP in the last 8760 hours.  ProBNP (last 3 results) No results for input(s): PROBNP in the last 8760 hours.  CBG: Recent Labs  Lab 03/17/19 1754 03/17/19 2349 03/18/19 0504 03/18/19 1212 03/18/19 1801  GLUCAP 104* 129* 141* 123* 88    No results found for this or any previous visit (from the past 240 hour(s)).    Time spent :  15 minutes  Blain Pais, MD [facility]  **If unable to reach the above provider after paging please contact the [place] @ [phone]  On-Call/Text Page:      [email or web address]      password [email or web address]  If 7PM-7AM, please contact night-coverage [email or web address] Password [email or web address] 03/18/2019, 8:54 PM   LOS: 11 days

## 2019-03-18 NOTE — H&P (View-Only) (Signed)
[name], male   DOB: 1940-10-28, 78 y.o.   MRN: [id number]    6 Days Post-Op  Subjective: Patient with no issues with his CLD or his Boost supplements.  Had a BM yesterday for only the second time this admission.  No bloating or nausea with intake.  No pain.  ROS: See above, otherwise other systems negative  Objective: Vital signs in last 24 hours: Temp:  [98.1 F (36.7 C)-98.2 F (36.8 C)] 98.2 F (36.8 C) (12/13 1940) Pulse Rate:  [66-79] 67 (12/14 0509) Resp:  [14] 14 (12/14 0509) BP: (92-120)/(64-72) 92/64 (12/14 0509) SpO2:  [94 %-100 %] 98 % (12/14 0509) Last BM Date: 03/15/19(pt reports )  Intake/Output from previous day: 12/13 0701 - 12/14 0700 In: 2618.8 [P.O.:870; I.V.:1748.8] Out: -  Intake/Output this shift: No intake/output data recorded.  PE: Heart: regular Lungs: CTAB Abd: soft, NT, ND, +BS  Lab Results:  Recent Labs    03/16/19 0541 03/18/19 0457  WBC 3.7* 3.6*  HGB 11.0* 11.1*  HCT 33.7* 34.6*  PLT 172 164   BMET Recent Labs    03/17/19 0517 03/18/19 0457  NA 140 139  K 4.4 4.4  CL 106 107  CO2 28 27  GLUCOSE 136* 149*  BUN 9 14  CREATININE 0.94 0.88  CALCIUM 8.7* 8.7*   PT/INR No results for input(s): LABPROT, INR in the last 72 hours. CMP     Component Value Date/Time   NA 139 03/18/2019 0457   K 4.4 03/18/2019 0457   CL 107 03/18/2019 0457   CO2 27 03/18/2019 0457   GLUCOSE 149 (H) 03/18/2019 0457   BUN 14 03/18/2019 0457   CREATININE 0.88 03/18/2019 0457   CREATININE 1.06 07/16/2012 1038   CALCIUM 8.7 (L) 03/18/2019 0457   PROT 5.8 (L) 03/18/2019 0457   ALBUMIN 3.2 (L) 03/18/2019 0457   AST 14 (L) 03/18/2019 0457   ALT 13 03/18/2019 0457   ALKPHOS 40 03/18/2019 0457   BILITOT 0.4 03/18/2019 0457   GFRNONAA >60 03/18/2019 0457   GFRAA >60 03/18/2019 0457   Lipase     Component Value Date/Time   LIPASE 30 03/07/2019 1000       Studies/Results: No results found.  Anti-infectives:  Anti-infectives (From admission, onward)   None       Assessment/Plan Hypertension Diabetes mellitus  Gastric outlet obstruction secondary to adenocarcinoma - cont CLD and Boost along with TNA for nutritional supplementation -Dr. Redmond Pulling reviewing imaging etc today to plan surgical intervention for this patient as this will be a fairly big operation. -discussed this with the patient and told him the earliest surgery would be would possibly be tomorrow. -cont current care for now  FEN-CLD, prostat and breeze, TNA VTE-Lovenox ID-none currently needed Foley:none Follow up:TBD   LOS: 11 days    Henreitta Cea , [facility] 03/18/2019, 8:27 AM Please see Amion for pager number during day hours 7:00am-4:30pm

## 2019-03-18 NOTE — Care Management Important Message (Signed)
Important Message  Patient Details IM Letter given to Marney Doctor RN Case Manager to present to the Patient Name: Jonathan Lewis MRN: [id number] Date of Birth: [birth year]   Medicare Important Message Given:  Yes     Kerin Salen 03/18/2019, 10:42 AM

## 2019-03-18 NOTE — Progress Notes (Signed)
PHARMACY - TOTAL PARENTERAL NUTRITION CONSULT NOTE   Indication: GOO  Patient Measurements: Height: 5\' 8"  (172.7 cm) Weight: 171 lb 8.3 oz (77.8 kg) IBW/kg (Calculated) : 68.4 TPN AdjBW (KG): 77.8 Body mass index is 26.08 kg/m. Usual Weight:   Assessment:  Patient is a 78 y.o M presented to ED on 12/3 with c/o recurrent emesis and abdominal discomfort.  Abdominal CT on 12/2 showed findings consistent with "outlet obstruction or severe dysmotility" and suspected gastric mass. He underwent EGD with biopsy on 12/4 and repeat EUS/EGD with dilation/biopsy on 12/8.  Biopsy done on 12/8 came back with evidence of gastric adenocarcinoma.  CCS recom. surgery with this admssion and to start TPN on 12/11.   Glucose / Insulin: sensitive SSI q6h; cbgs <150, 0 unit of Novolog in past 24 hours  Electrolytes: electrolytes are WNL; Cl and CO2 wnl Renal: scr wnl LFTs / TGs: - AST/ALT wnl - TG 96 ( 12/12), 105 ( 12/14)  Prealbumin / albumin:  - albumin 2.9 (12/11), 3.2 ( 12/14)  - prealbumin 18 (12/11), 20.1 ( 12/14)  Intake / Output; MIVF: I/O + 1250;  D5 NS at 10 ml/hr GI Imaging: Surgeries / Procedures:  - 12/4 EGD: LA grade D esophagitis, severe stenosis was found at the pylorus, large amount of food residue in stomach, biopsies taken - 12/7: biopsies negative for cancer - 12/8: EUS and EGD with dilation (severe pylorus stenosis- biopsied and dilated) - 12/10: pylorus biopsy showed evidence of gastric adenocarcinoma  Central access: 12/11 PICC   TPN start date: 12/11   Nutritional Goals (per RD recommendation on 12/11): Kcal:  1880-2110 kcal Protein:  90-100 grams Fluid:  >/= 2 L/day  Goal TPN rate is 85 mL/hr (provides 96 g of protein and 2045 kcals per day)  Current Nutrition: NPO starting on 12/11 - Boost tid started on 12/4>> 12/11, 12/12 >>  - pro-stat tid started on 12/4>> 12/11  Plan:   Continue TPN to 85 mL/hr at 1800, the target rate   Electrolytes in TPN: 63mEq/L of  Na, 55 mEq/L of K, 68mEq/L of Ca, 8 mEq/L of Mg, and 12 mmol/L of Phos. Cl:Ac ratio 1:1  Add to TPN MWF (due to backorder status). Add trace elements to TPN   Continue famotidine in TPN  Continue sSSI q6h and adjust as needed   Continue MIVF to 10 mL/hr at 1800  BMET, magnesium, phosphorus  With AM labs   TPN labs on Mon/Thurs   Royetta Asal, PharmD, BCPS 03/18/2019 7:59 AM

## 2019-03-18 NOTE — Progress Notes (Signed)
[name], male   DOB: 1940/10/25, 78 y.o.   MRN: [id number]    6 Days Post-Op  Subjective: Patient with no issues with his CLD or his Boost supplements.  Had a BM yesterday for only the second time this admission.  No bloating or nausea with intake.  No pain.  ROS: See above, otherwise other systems negative  Objective: Vital signs in last 24 hours: Temp:  [98.1 F (36.7 C)-98.2 F (36.8 C)] 98.2 F (36.8 C) (12/13 1940) Pulse Rate:  [66-79] 67 (12/14 0509) Resp:  [14] 14 (12/14 0509) BP: (92-120)/(64-72) 92/64 (12/14 0509) SpO2:  [94 %-100 %] 98 % (12/14 0509) Last BM Date: 03/15/19(pt reports )  Intake/Output from previous day: 12/13 0701 - 12/14 0700 In: 2618.8 [P.O.:870; I.V.:1748.8] Out: -  Intake/Output this shift: No intake/output data recorded.  PE: Heart: regular Lungs: CTAB Abd: soft, NT, ND, +BS  Lab Results:  Recent Labs    03/16/19 0541 03/18/19 0457  WBC 3.7* 3.6*  HGB 11.0* 11.1*  HCT 33.7* 34.6*  PLT 172 164   BMET Recent Labs    03/17/19 0517 03/18/19 0457  NA 140 139  K 4.4 4.4  CL 106 107  CO2 28 27  GLUCOSE 136* 149*  BUN 9 14  CREATININE 0.94 0.88  CALCIUM 8.7* 8.7*   PT/INR No results for input(s): LABPROT, INR in the last 72 hours. CMP     Component Value Date/Time   NA 139 03/18/2019 0457   K 4.4 03/18/2019 0457   CL 107 03/18/2019 0457   CO2 27 03/18/2019 0457   GLUCOSE 149 (H) 03/18/2019 0457   BUN 14 03/18/2019 0457   CREATININE 0.88 03/18/2019 0457   CREATININE 1.06 07/16/2012 1038   CALCIUM 8.7 (L) 03/18/2019 0457   PROT 5.8 (L) 03/18/2019 0457   ALBUMIN 3.2 (L) 03/18/2019 0457   AST 14 (L) 03/18/2019 0457   ALT 13 03/18/2019 0457   ALKPHOS 40 03/18/2019 0457   BILITOT 0.4 03/18/2019 0457   GFRNONAA >60 03/18/2019 0457   GFRAA >60 03/18/2019 0457   Lipase     Component Value Date/Time   LIPASE 30 03/07/2019 1000       Studies/Results: No results  found.  Anti-infectives: Anti-infectives (From admission, onward)   None       Assessment/Plan Hypertension Diabetes mellitus  Gastric outlet obstruction secondary to adenocarcinoma - cont CLD and Boost along with TNA for nutritional supplementation -Dr. Redmond Pulling reviewing imaging etc today to plan surgical intervention for this patient as this will be a fairly big operation. -discussed this with the patient and told him the earliest surgery would be would possibly be tomorrow. -cont current care for now  FEN-CLD, prostat and breeze, TNA VTE-Lovenox ID-none currently needed Foley:none Follow up:TBD   LOS: 11 days    Henreitta Cea , [facility] 03/18/2019, 8:27 AM Please see Amion for pager number during day hours 7:00am-4:30pm

## 2019-03-19 ENCOUNTER — Encounter (HOSPITAL_COMMUNITY): Admission: EM | Disposition: A | Discharge status: Home Health | Payer: Self-pay | Source: Home / Self Care

## 2019-03-19 ENCOUNTER — Inpatient Hospital Stay (HOSPITAL_COMMUNITY): Payer: Medicare Other | Admitting: Anesthesiology

## 2019-03-19 DIAGNOSIS — Z9889 Other specified postprocedural states: Secondary | ICD-10-CM

## 2019-03-19 HISTORY — PX: LAPAROTOMY: SHX154

## 2019-03-19 LAB — GLUCOSE, CAPILLARY
Glucose-Capillary: 119 mg/dL — ABNORMAL HIGH (ref 70–99)
Glucose-Capillary: 135 mg/dL — ABNORMAL HIGH (ref 70–99)
Glucose-Capillary: 192 mg/dL — ABNORMAL HIGH (ref 70–99)
Glucose-Capillary: 241 mg/dL — ABNORMAL HIGH (ref 70–99)

## 2019-03-19 LAB — BASIC METABOLIC PANEL
Anion gap: 5 (ref 5–15)
Anion gap: 8 (ref 5–15)
BUN: 18 mg/dL (ref 8–23)
BUN: 19 mg/dL (ref 8–23)
CO2: 28 mmol/L (ref 22–32)
CO2: 25 mmol/L (ref 22–32)
Calcium: 8.7 mg/dL — ABNORMAL LOW (ref 8.9–10.3)
Calcium: 8.4 mg/dL — ABNORMAL LOW (ref 8.9–10.3)
Chloride: 104 mmol/L (ref 98–111)
Chloride: 102 mmol/L (ref 98–111)
Creatinine, Ser: 0.82 mg/dL (ref 0.61–1.24)
Creatinine, Ser: 0.94 mg/dL (ref 0.61–1.24)
GFR calc Af Amer: >60 mL/min (ref >60)
GFR calc Af Amer: >60 mL/min (ref >60)
GFR calc non Af Amer: >60 mL/min (ref >60)
GFR calc non Af Amer: >60 mL/min (ref >60)
Glucose, Bld: 152 mg/dL — ABNORMAL HIGH (ref 70–99)
Glucose, Bld: 258 mg/dL — ABNORMAL HIGH (ref 70–99)
Potassium: 4.2 mmol/L (ref 3.5–5.1)
Potassium: 4.5 mmol/L (ref 3.5–5.1)
Sodium: 137 mmol/L (ref 135–145)
Sodium: 135 mmol/L (ref 135–145)

## 2019-03-19 LAB — MAGNESIUM: Magnesium: 2.2 mg/dL (ref 1.7–2.4)

## 2019-03-19 LAB — PHOSPHORUS: Phosphorus: 3.4 mg/dL (ref 2.5–4.6)

## 2019-03-19 LAB — CBC
HCT: 35.5 % — ABNORMAL LOW (ref 39.0–52.0)
Hemoglobin: 11.3 g/dL — ABNORMAL LOW (ref 13.0–17.0)
MCH: 28 pg (ref 26.0–34.0)
MCHC: 31.8 g/dL (ref 30.0–36.0)
MCV: 88.1 fL (ref 80.0–100.0)
Platelets: 169 10*3/uL (ref 150–400)
RBC: 4.03 MIL/uL — ABNORMAL LOW (ref 4.22–5.81)
RDW: 12.3 % (ref 11.5–15.5)
WBC: 4.3 10*3/uL (ref 4.0–10.5)
nRBC: 0 % (ref 0.0–0.2)

## 2019-03-19 LAB — ABO/RH: ABO/RH(D): O POS

## 2019-03-19 LAB — TYPE AND SCREEN
ABO/RH(D): O POS
Antibody Screen: NEGATIVE

## 2019-03-19 SURGERY — LAPAROTOMY, EXPLORATORY
Anesthesia: General | Site: Abdomen

## 2019-03-19 MED ORDER — LIDOCAINE 2% (20 MG/ML) 5 ML SYRINGE: INTRAMUSCULAR | Status: DC | PRN

## 2019-03-19 MED ORDER — PHENYLEPHRINE 40 MCG/ML (10ML) SYRINGE FOR IV PUSH (FOR BLOOD PRESSURE SUPPORT)
PREFILLED_SYRINGE | INTRAVENOUS | Status: DC | PRN
  Administered 2019-03-19 (×4): 80 ug via INTRAVENOUS

## 2019-03-19 MED ORDER — SUGAMMADEX SODIUM 200 MG/2ML IV SOLN
INTRAVENOUS | Status: DC | PRN
  Administered 2019-03-19: 155.6 mg via INTRAVENOUS

## 2019-03-19 MED ORDER — OXYCODONE HCL 5 MG/5ML PO SOLN: 5.0000 mg | Freq: Once | ORAL | Status: DC | PRN

## 2019-03-19 MED ORDER — ONDANSETRON HCL 4 MG/2ML IJ SOLN: 4.0000 mg | Freq: Once | INTRAMUSCULAR | Status: DC | PRN

## 2019-03-19 MED ORDER — FENTANYL CITRATE (PF) 100 MCG/2ML IJ SOLN
INTRAMUSCULAR | Status: AC
  Filled 2019-03-19: qty 2

## 2019-03-19 MED ORDER — ALBUMIN HUMAN 5 % IV SOLN
INTRAVENOUS | Status: AC
  Filled 2019-03-19: qty 250

## 2019-03-19 MED ORDER — DEXAMETHASONE SODIUM PHOSPHATE 10 MG/ML IJ SOLN
INTRAMUSCULAR | Status: DC | PRN
  Administered 2019-03-19: 10 mg via INTRAVENOUS

## 2019-03-19 MED ORDER — MORPHINE SULFATE (PF) 4 MG/ML IV SOLN
1.0000 mg | INTRAVENOUS | Status: DC | PRN
  Administered 2019-03-19: 2 mg via INTRAVENOUS
  Administered 2019-03-20: 4 mg via INTRAVENOUS
  Filled 2019-03-19 (×2): qty 1

## 2019-03-19 MED ORDER — PHENYLEPHRINE 40 MCG/ML (10ML) SYRINGE FOR IV PUSH (FOR BLOOD PRESSURE SUPPORT)
PREFILLED_SYRINGE | INTRAVENOUS | Status: AC
  Filled 2019-03-19: qty 10

## 2019-03-19 MED ORDER — FENTANYL CITRATE (PF) 100 MCG/2ML IJ SOLN: 25.0000 ug | INTRAMUSCULAR | Status: DC | PRN

## 2019-03-19 MED ORDER — SUFENTANIL CITRATE 50 MCG/ML IV SOLN
INTRAVENOUS | Status: AC
  Filled 2019-03-19: qty 1

## 2019-03-19 MED ORDER — LIDOCAINE 2% (20 MG/ML) 5 ML SYRINGE
INTRAMUSCULAR | Status: AC
  Filled 2019-03-19: qty 5

## 2019-03-19 MED ORDER — ACETAMINOPHEN 500 MG PO TABS: 1000.0000 mg | ORAL_TABLET | Freq: Once | ORAL | Status: DC | PRN

## 2019-03-19 MED ORDER — TISSEEL VH 10 ML EX KIT
PACK | CUTANEOUS | Status: AC
  Filled 2019-03-19: qty 1

## 2019-03-19 MED ORDER — FENTANYL CITRATE (PF) 100 MCG/2ML IJ SOLN
INTRAMUSCULAR | Status: DC | PRN
  Administered 2019-03-19 (×2): 25 ug via INTRAVENOUS

## 2019-03-19 MED ORDER — TRAVASOL 10 % IV SOLN
INTRAVENOUS | Status: AC
  Filled 2019-03-19: qty 958.8

## 2019-03-19 MED ORDER — ROCURONIUM BROMIDE 10 MG/ML (PF) SYRINGE
PREFILLED_SYRINGE | INTRAVENOUS | Status: AC
  Filled 2019-03-19: qty 10

## 2019-03-19 MED ORDER — ACETAMINOPHEN 160 MG/5ML PO SOLN: 1000.0000 mg | Freq: Once | ORAL | Status: DC | PRN

## 2019-03-19 MED ORDER — BUPIVACAINE HCL (PF) 0.5 % IJ SOLN
INTRAMUSCULAR | Status: AC
  Filled 2019-03-19: qty 30

## 2019-03-19 MED ORDER — EPHEDRINE 5 MG/ML INJ
INTRAVENOUS | Status: AC
  Filled 2019-03-19: qty 10

## 2019-03-19 MED ORDER — LIDOCAINE HCL 2 % IJ SOLN
INTRAMUSCULAR | Status: AC
  Filled 2019-03-19: qty 20

## 2019-03-19 MED ORDER — SUCCINYLCHOLINE CHLORIDE 200 MG/10ML IV SOSY
PREFILLED_SYRINGE | INTRAVENOUS | Status: DC | PRN
  Administered 2019-03-19: 120 mg via INTRAVENOUS

## 2019-03-19 MED ORDER — BUPIVACAINE-EPINEPHRINE 0.5% -1:200000 IJ SOLN
INTRAMUSCULAR | Status: DC | PRN
  Administered 2019-03-19: 30 mL

## 2019-03-19 MED ORDER — KETAMINE HCL 10 MG/ML IJ SOLN
INTRAMUSCULAR | Status: AC
  Filled 2019-03-19: qty 1

## 2019-03-19 MED ORDER — ONDANSETRON HCL 4 MG/2ML IJ SOLN
INTRAMUSCULAR | Status: AC
  Filled 2019-03-19: qty 2

## 2019-03-19 MED ORDER — LIDOCAINE 2% (20 MG/ML) 5 ML SYRINGE
INTRAMUSCULAR | Status: DC | PRN
  Administered 2019-03-19: 1 mg/kg/h via INTRAVENOUS

## 2019-03-19 MED ORDER — SODIUM CHLORIDE (PF) 0.9 % IJ SOLN
INTRAMUSCULAR | Status: AC
  Filled 2019-03-19: qty 10

## 2019-03-19 MED ORDER — SUFENTANIL CITRATE 50 MCG/ML IV SOLN
INTRAVENOUS | Status: DC | PRN
  Administered 2019-03-19 (×5): 10 ug via INTRAVENOUS

## 2019-03-19 MED ORDER — PHENOL 1.4 % MT LIQD
1.0000 | OROMUCOSAL | Status: DC | PRN
  Filled 2019-03-19: qty 177

## 2019-03-19 MED ORDER — LACTATED RINGERS IV SOLN: INTRAVENOUS | Status: DC

## 2019-03-19 MED ORDER — METHOCARBAMOL 500 MG IVPB - SIMPLE MED
500.0000 mg | Freq: Three times a day (TID) | INTRAVENOUS | Status: DC | PRN
  Filled 2019-03-19: qty 50

## 2019-03-19 MED ORDER — PEDIALYTE PO SOLN
240.0000 mL | ORAL | Status: DC
  Administered 2019-03-19: 240 mL
  Filled 2019-03-19: qty 1000

## 2019-03-19 MED ORDER — LIDOCAINE 2% (20 MG/ML) 5 ML SYRINGE
INTRAMUSCULAR | Status: DC | PRN
  Administered 2019-03-19: 60 mg via INTRAVENOUS

## 2019-03-19 MED ORDER — ACETAMINOPHEN 10 MG/ML IV SOLN
1000.0000 mg | Freq: Four times a day (QID) | INTRAVENOUS | Status: AC
  Administered 2019-03-19 – 2019-03-20 (×4): 1000 mg via INTRAVENOUS
  Filled 2019-03-19 (×4): qty 100

## 2019-03-19 MED ORDER — KETAMINE HCL 10 MG/ML IJ SOLN
INTRAMUSCULAR | Status: DC | PRN
  Administered 2019-03-19: 30 mg via INTRAVENOUS

## 2019-03-19 MED ORDER — GABAPENTIN 250 MG/5ML PO SOLN
100.0000 mg | Freq: Two times a day (BID) | ORAL | Status: DC
  Administered 2019-03-19 – 2019-03-21 (×4): 100 mg
  Filled 2019-03-19 (×7): qty 2

## 2019-03-19 MED ORDER — ROCURONIUM BROMIDE 10 MG/ML (PF) SYRINGE
PREFILLED_SYRINGE | INTRAVENOUS | Status: DC | PRN
  Administered 2019-03-19 (×2): 10 mg via INTRAVENOUS
  Administered 2019-03-19: 50 mg via INTRAVENOUS
  Administered 2019-03-19 (×4): 10 mg via INTRAVENOUS
  Administered 2019-03-19: 20 mg via INTRAVENOUS
  Administered 2019-03-19: 10 mg via INTRAVENOUS

## 2019-03-19 MED ORDER — PROPOFOL 10 MG/ML IV BOLUS
INTRAVENOUS | Status: DC | PRN
  Administered 2019-03-19: 110 mg via INTRAVENOUS

## 2019-03-19 MED ORDER — DEXAMETHASONE SODIUM PHOSPHATE 10 MG/ML IJ SOLN
INTRAMUSCULAR | Status: AC
  Filled 2019-03-19: qty 1

## 2019-03-19 MED ORDER — ENOXAPARIN SODIUM 40 MG/0.4ML ~~LOC~~ SOLN: 40.0000 mg | SUBCUTANEOUS | Status: DC

## 2019-03-19 MED ORDER — 0.9 % SODIUM CHLORIDE (POUR BTL) OPTIME
TOPICAL | Status: DC | PRN
  Administered 2019-03-19: 2000 mL

## 2019-03-19 MED ORDER — ALBUMIN HUMAN 5 % IV SOLN: INTRAVENOUS | Status: DC | PRN

## 2019-03-19 MED ORDER — MENTHOL 3 MG MT LOZG: 1.0000 | LOZENGE | OROMUCOSAL | Status: DC | PRN

## 2019-03-19 MED ORDER — BUPIVACAINE LIPOSOME 1.3 % IJ SUSP
20.0000 mL | Freq: Once | INTRAMUSCULAR | Status: AC
  Administered 2019-03-19: 10 mL
  Filled 2019-03-19: qty 20

## 2019-03-19 MED ORDER — OXYCODONE HCL 5 MG PO TABS: 5.0000 mg | ORAL_TABLET | Freq: Once | ORAL | Status: DC | PRN

## 2019-03-19 MED ORDER — ONDANSETRON HCL 4 MG/2ML IJ SOLN
INTRAMUSCULAR | Status: DC | PRN
  Administered 2019-03-19: 4 mg via INTRAVENOUS

## 2019-03-19 MED ORDER — ACETAMINOPHEN 10 MG/ML IV SOLN: 1000.0000 mg | Freq: Once | INTRAVENOUS | Status: DC | PRN

## 2019-03-19 MED ORDER — EPHEDRINE SULFATE-NACL 50-0.9 MG/10ML-% IV SOSY
PREFILLED_SYRINGE | INTRAVENOUS | Status: DC | PRN
  Administered 2019-03-19 (×2): 10 mg via INTRAVENOUS

## 2019-03-19 SURGICAL SUPPLY — 64 items
ADPR CATH UNV TPR FL F LL (CATHETERS) ×1
BLADE EXTENDED COATED 6.5IN (ELECTRODE) ×2 IMPLANT
CATH ROBINSON RED A/P 14FR (CATHETERS) ×1 IMPLANT
CELLS DAT CNTRL 66122 CELL SVR (MISCELLANEOUS) IMPLANT
CLIP VESOCCLUDE LG 6/CT (CLIP) ×1 IMPLANT
CLIP VESOCCLUDE MED 6/CT (CLIP) ×1 IMPLANT
CONNECTOR CATH FOLEY FEMALE LL (CATHETERS) ×1 IMPLANT
COVER SURGICAL LIGHT HANDLE (MISCELLANEOUS) ×2 IMPLANT
COVER WAND RF STERILE (DRAPES) IMPLANT
DRAIN CHANNEL 19F RND (DRAIN) IMPLANT
DRAPE LAPAROSCOPIC ABDOMINAL (DRAPES) IMPLANT
DRSG OPSITE POSTOP 4X10 (GAUZE/BANDAGES/DRESSINGS) ×1 IMPLANT
DRSG OPSITE POSTOP 4X6 (GAUZE/BANDAGES/DRESSINGS) IMPLANT
DRSG OPSITE POSTOP 4X8 (GAUZE/BANDAGES/DRESSINGS) IMPLANT
ELECT REM PT RETURN 15FT ADLT (MISCELLANEOUS) ×2 IMPLANT
EVACUATOR SILICONE 100CC (DRAIN) ×1 IMPLANT
GAUZE SPONGE 4X4 12PLY STRL (GAUZE/BANDAGES/DRESSINGS) ×2 IMPLANT
GLOVE BIO SURGEON STRL SZ7.5 (GLOVE) ×4 IMPLANT
GLOVE BIOGEL PI IND STRL 7.0 (GLOVE) ×1 IMPLANT
GLOVE BIOGEL PI INDICATOR 7.0 (GLOVE) ×1
GLOVE ECLIPSE 7.0 STRL STRAW (GLOVE) ×10 IMPLANT
GLOVE INDICATOR 8.0 STRL GRN (GLOVE) ×4 IMPLANT
GOWN STRL REUS W/TWL XL LVL3 (GOWN DISPOSABLE) ×12 IMPLANT
KIT TURNOVER KIT A (KITS) IMPLANT
LEGGING LITHOTOMY PAIR STRL (DRAPES) IMPLANT
NDL HYPO 25X1 1.5 SAFETY (NEEDLE) IMPLANT
NEEDLE HYPO 25X1 1.5 SAFETY (NEEDLE) ×2 IMPLANT
NS IRRIG 1000ML POUR BTL (IV SOLUTION) ×4 IMPLANT
PACK GENERAL/GYN (CUSTOM PROCEDURE TRAY) ×2 IMPLANT
PENCIL SMOKE EVACUATOR (MISCELLANEOUS) ×1 IMPLANT
RELOAD STAPLE 60 3.6 BLU REG (STAPLE) IMPLANT
RELOAD STAPLE 60 4.1 GRN THCK (STAPLE) IMPLANT
RELOAD STAPLER BLUE 60MM (STAPLE) ×2 IMPLANT
RELOAD STAPLER GREEN 60MM (STAPLE) ×3 IMPLANT
RETRACTOR WND ALEXIS 18 MED (MISCELLANEOUS) IMPLANT
RTRCTR WOUND ALEXIS 18CM MED (MISCELLANEOUS)
SHEARS FOC LG CVD HARMONIC 17C (MISCELLANEOUS) ×1 IMPLANT
SHEARS HARMONIC 9CM CVD (BLADE) ×1 IMPLANT
SHEARS HARMONIC ACE PLUS 36CM (ENDOMECHANICALS) IMPLANT
STAPLER RELOAD BLUE 60MM (STAPLE) ×4
STAPLER RELOAD GREEN 60MM (STAPLE) ×6
STAPLER VISISTAT 35W (STAPLE) ×2 IMPLANT
SUT PDS AB 1 CTX 36 (SUTURE) IMPLANT
SUT PDS AB 1 TP1 96 (SUTURE) ×1 IMPLANT
SUT PDS AB 3-0 SH 27 (SUTURE) ×2 IMPLANT
SUT PDS AB 4-0 SH 27 (SUTURE) IMPLANT
SUT PROLENE 2 0 BLUE (SUTURE) IMPLANT
SUT SILK 0 FSL (SUTURE) ×1 IMPLANT
SUT SILK 0 SH 30 (SUTURE) ×1 IMPLANT
SUT SILK 2 0 (SUTURE) ×4
SUT SILK 2 0 SH CR/8 (SUTURE) ×4 IMPLANT
SUT SILK 2 0SH CR/8 30 (SUTURE) IMPLANT
SUT SILK 2-0 18XBRD TIE 12 (SUTURE) ×2 IMPLANT
SUT SILK 2-0 30XBRD TIE 12 (SUTURE) IMPLANT
SUT SILK 3 0 (SUTURE) ×4
SUT SILK 3 0 SH CR/8 (SUTURE) ×8 IMPLANT
SUT SILK 3-0 18XBRD TIE 12 (SUTURE) ×2 IMPLANT
SUT VIC AB 3-0 SH 18 (SUTURE) ×1 IMPLANT
SYR 10ML ECCENTRIC (SYRINGE) ×1 IMPLANT
SYR 10ML LL (SYRINGE) ×1 IMPLANT
SYR 20ML LL LF (SYRINGE) ×1 IMPLANT
TOWEL OR 17X26 10 PK STRL BLUE (TOWEL DISPOSABLE) ×2 IMPLANT
TOWEL OR NON WOVEN STRL DISP B (DISPOSABLE) ×2 IMPLANT
TRAY FOLEY MTR SLVR 16FR STAT (SET/KITS/TRAYS/PACK) ×2 IMPLANT

## 2019-03-19 NOTE — Interval H&P Note (Signed)
History and Physical Interval Note:  03/19/2019 11:49 AM  Jonathan Lewis  has presented today for surgery, with the diagnosis of GASTRIC CANCER.  The various methods of treatment have been discussed with the patient and family. After consideration of risks, benefits and other options for treatment, the patient has consented to  Procedure(s): EXPLORATORY LAPAROTOMY, PARTIAL GASTRECTOMY AND PLACEMENT OF G AND J TUBE (N/A) as a surgical intervention.  The patient's history has been reviewed, patient examined, no change in status, stable for surgery.  I have reviewed the patient's chart and labs.  Questions were answered to the patient's satisfaction.    Leighton Ruff. Redmond Pulling, MD, FACS General, Bariatric, & Minimally Invasive Surgery [facility] [place]   Greer Pickerel

## 2019-03-19 NOTE — Anesthesia Postprocedure Evaluation (Signed)
Anesthesia Post Note  Patient: Jonathan Lewis  Procedure(s) Performed: EXPLORATORY LAPAROTOMY, distal GASTRECTOMY AND PLACEMENT OF G AND J TUBE, gastric jejunostomy (N/A Abdomen)     Patient location during evaluation: PACU Anesthesia Type: General Level of consciousness: sedated Pain management: pain level controlled Vital Signs Assessment: post-procedure vital signs reviewed and stable Respiratory status: spontaneous breathing and respiratory function stable Cardiovascular status: stable Postop Assessment: no apparent nausea or vomiting Anesthetic complications: no    Last Vitals:  Vitals:   03/19/19 1730 03/19/19 1745  BP: 120/75 118/78  Pulse: 88 82  Resp: 12 11  Temp:    SpO2: 98% 100%    Last Pain:  Vitals:   03/19/19 1745  TempSrc:   PainSc: [place]

## 2019-03-19 NOTE — Progress Notes (Signed)
6E secretary called bed placement and we were not made adware that the patient was not coming back to the floor. This RN called the ICU desk and was transferred to the ICU charge. ICU charge nurse said Lauren RN will be taking this patient. I will call for report.

## 2019-03-19 NOTE — Anesthesia Procedure Notes (Signed)
Procedure Name: Intubation Date/Time: 03/19/2019 12:34 PM Performed by: Sharlette Dense, CRNA Patient Re-evaluated:Patient Re-evaluated prior to induction Oxygen Delivery Method: Circle system utilized Preoxygenation: Pre-oxygenation with 100% oxygen Induction Type: Rapid sequence and Cricoid Pressure applied Laryngoscope Size: Miller and 3 Grade View: Grade I Tube type: Oral Tube size: 8.0 mm Number of attempts: 1 Airway Equipment and Method: Stylet Placement Confirmation: ETT inserted through vocal cords under direct vision,  positive ETCO2 and breath sounds checked- equal and bilateral Secured at: 22 cm Tube secured with: Tape Dental Injury: Teeth and Oropharynx as per pre-operative assessment

## 2019-03-19 NOTE — Progress Notes (Signed)
PHARMACY - TOTAL PARENTERAL NUTRITION CONSULT NOTE   Indication: GOO  Patient Measurements: Height: 5\' 8"  (172.7 cm) Weight: 171 lb 8.3 oz (77.8 kg) IBW/kg (Calculated) : 68.4 TPN AdjBW (KG): 77.8 Body mass index is 26.08 kg/m. Usual Weight:   Assessment:  Patient is a 78 y.o M presented to ED on 12/3 with c/o recurrent emesis and abdominal discomfort.  Abdominal CT on 12/2 showed findings consistent with "outlet obstruction or severe dysmotility" and suspected gastric mass. He underwent EGD with biopsy on 12/4 and repeat EUS/EGD with dilation/biopsy on 12/8.  Biopsy done on 12/8 came back with evidence of gastric adenocarcinoma.  CCS recom. surgery with this admssion and to start TPN on 12/11.   Glucose / Insulin: sensitive SSI q6h; cbgs <150, 1 unit of Novolog in past 24 hours (88-135) Electrolytes: electrolytes are WNL; Cl and CO2 wnl Renal: scr wnl LFTs / TGs: - AST/ALT wnl - TG 96 ( 12/12), 105 ( 12/14)  Prealbumin / albumin:  - albumin 2.9 (12/11), 3.2 ( 12/14)  - prealbumin 18 (12/11), 20.1 ( 12/14)  Intake / Output; MIVF: I/O + 1250;  D5 NS at 10 ml/hr GI Imaging: Surgeries / Procedures:  - 12/4 EGD: LA grade D esophagitis, severe stenosis was found at the pylorus, large amount of food residue in stomach, biopsies taken - 12/7: biopsies negative for cancer - 12/8: EUS and EGD with dilation (severe pylorus stenosis- biopsied and dilated) - 12/10: pylorus biopsy showed evidence of gastric adenocarcinoma - 12/15 Expl lap: Plan - look for mets, if no mets proceed with partial gastrectomy with BII, g & J tube placement; if mets --> palliative bypass, with g & j tube.  Central access: 12/11 PICC   TPN start date: 12/11   Nutritional Goals (per RD recommendation on 12/11): Kcal:  1880-2110 kcal Protein:  90-100 grams Fluid:  >/= 2 L/day  Goal TPN rate 85 mL/hr (provides 96 g of protein and 2045 kcals per day)  Current Nutrition: NPO starting on 12/11 - Boost tid  started on 12/4>> 12/11, resumed 12/12 >> (charted tid) - pro-stat tid 12/4>> 12/11  Plan:   Continue TPN at goal rate of 85 mL/hr    Electrolytes in TPN: 42mEq/L of Na, 55 mEq/L of K, 48mEq/L of Ca, 8 mEq/L of Mg, and 12 mmol/L of Phos. Cl:Ac ratio 1:1  Add MVI to TPN only MWF (due to backorder status). Add trace elements to TPN daily  Continue famotidine in TPN  Continue sSSI q6h and adjust as needed   Continue MIVF at 10 mL/hr   TPN labs on Mon/Thurs  BMET, Mag & Phos in am 12/16  Minda Ditto PharmD Pager [phone] 03/19/2019, 7:26 AM

## 2019-03-19 NOTE — Brief Op Note (Addendum)
03/07/2019 - 03/19/2019  4:49 PM  PATIENT:  Jonathan Lewis  78 y.o. male  PRE-OPERATIVE DIAGNOSIS:  DISTAL GASTRIC CANCER  POST-OPERATIVE DIAGNOSIS:  DISTAL GASTRIC CANCER  PROCEDURE:  Procedure(s): EXPLORATORY LAPAROTOMY, distal GASTRECTOMY with Billroth II gastrojejunostomy, portal lymph node dissection; AND PLACEMENT OF 24 fr G AND J TUBE,  (N/A)  SURGEON:  Surgeon(s) and Role:    * Greer Pickerel, MD - Primary    * Donnie Mesa, MD - Assisting    * Johnathan Hausen, MD - Assisting    * Alphonsa Overall, MD - Assisting  PHYSICIAN ASSISTANT: Saverio Danker PA-C  ASSISTANTS: see above   ANESTHESIA:   general  EBL:  200 mL   BLOOD ADMINISTERED:none  DRAINS: Nasogastric Tube, Urinary Catheter (Foley), Gastrostomy Tube and Jejunostomy Tube   LOCAL MEDICATIONS USED:  MARCAINE mixed with exparel  SPECIMEN:  Source of Specimen:  1) distal stomach and D1; 2) portal hepatic lymph node 3) superior pancreatic lesion 4) deep porta hepatic node - clip 5)small bowel nodule  DISPOSITION OF SPECIMEN:  PATHOLOGY  COUNTS:  YES  TOURNIQUET:  * No tourniquets in log *  DICTATION: .Other Dictation: Dictation Number [id number]  PLAN OF CARE: pacu  PATIENT DISPOSITION:  PACU - hemodynamically stable.   Delay start of Pharmacological VTE agent (>24hrs) due to surgical blood loss or risk of bleeding: start 12/16 AM  Leighton Ruff. Redmond Pulling, MD, FACS General, Bariatric, & Minimally Invasive Surgery [facility], [place]

## 2019-03-19 NOTE — Progress Notes (Signed)
VAST to ICU to hang TPN. Pt has not returned from OR as of this time.

## 2019-03-19 NOTE — Progress Notes (Signed)
[name] - Progress Note  Jonathan Lewis [id number] DOB: [birth year] DOA: 03/07/2019 PCP: Biagio Borg, MD   Brief narrative: HPI: Jonathan Lewis is a 78 y.o. male with medical history significant of GERD, hypertension, hyperglycemia not on any diabetic medication, acoustic neuroma, BPH. Admitted with complaints of persistent nausea vomiting and burping for the last 1 month. Patient had a televisit with his PCP on 1127 for hypoglycemia his blood sugar was 51-75 at home.  In addition he reported decreased appetite and lost 20 pounds in the last 1 month.  Denied hematochezia, melena, or hematemesis.  Due to significant reflux symptoms, burping and epigastric abdominal pain radiating up into the esophagus he presented to the ER for treatment.  Despite taking his usual Protonix and Pepcid his symptoms had not improved. He underwent CT scan of the abdomen (ordered by his PCP) 2 days prior to admission was ordered that showed gastric outlet obstruction from a possible mass.     ED Course:  CT of the abdomen and pelvis 03/06/2019 was reviewed: Marked distention of the stomach which is filled with food, fluid, and contrast material. This appearance is compatible with gastric outlet obstruction or severe dysmotility. There was a 2.0 x 1.7 cm nodular focus of soft tissue density in the region of the pylorus and while this area is difficult to assess by CT, features certainly raise concern for mass lesion with secondary gastric outlet obstruction.  No evidence for lymphadenopathy or metastatic disease in the abdomen/pelvis.Initial white count unremarkable.  Electrolyte panel unremarkable including normal renal function.  Subjective: Patient seen and examined this afternoon. Reported no abdominal pain. Denies diarrhea. Tolerating clear liquid diet and TPN.    Assessment/Plan: Active Problems: Gastric outlet obstruction secondary to severe erosive esophagitis with stricture-post EGD with  dilatation on 12/8 Presented as above Initial EGD on 12/4 revealed reflux esophagitis with gastric stenosis at the level of the pylorus and severe luminal narrowing stenting to at the duodenal bulb.  A large amount of retained food and fluid were noted and were evacuated during procedure He underwent a second EGD with dilatation on 12/8 that revealed: -LA Grade D esophagitis with no bleeding. - A large amount of food (residue) in the stomach. Suctioned via endoscope. - Congested, erythematous, friable (with contact bleeding), granular, hemorrhagic appearing, texture changed and increased vascular pattern mucosa in the antrum. Biopsied. - Gastric stenosis was found in the prepyloric region of the stomach. Biopsied. Then Dilated. - No gross lesions in the first portion of the duodenum and in the second portion of the duodenum. EUS Impression: - Wall thickening was seen in the pylorus. The thickening appeared to be primarily within the deep mucosa (Layer 2) and submucosa (Layer 3). Some area of more significant hyperechogenicity was noted, not clearly a muscle layer tumor, -Final report from FNA collected during EUS confirming adenocarcinoma. Patient was made aware of this findings by Dr. Rush Landmark with GI on 12/10. Appreciate his recommendations.   -General Surgery is following. Appreciate their recommendations. They started the patient on TPN and IV fluids prior to his surgery.  -He underwent ex lap and distal gatrectomy with lymph node dissection and placement of G and J tube on [id number] with no complications.  -Continue pain management post-op as recommended per surgery (patient may need PCA pump if morphine q1 hr prn not treating his pain adequately)   Dr. Marin Olp with Oncology consulted on 12/11. Appreciate recommendations. The patient will have imaging for staging, will  need PET scan in the outpatient setting.       Hypertension  Blood pressure currently controlled with as needed IV  hydralazine Continue to hold home lisinopril.  Diabetes mellitus 2 with hyperglycemia/peripheral neuropathy Diet controlled at home Patient reported 20 pound weight loss prior to admission which is also likely influencing glycemic control  CBG has been well controlled since his presentation  Continue gabapentin elixir Continue to follow CBG and provide SSI    Malnutrition of moderate degree Estimated body mass index is 26.08 kg/m as calculated from the following:   Height as of this encounter: 5\' 8"  (1.727 m).   Weight as of this encounter: 77.8 kg.  Malnutrition Type:  Nutrition Problem: Moderate Malnutrition Etiology: acute illness Malnutrition Characteristics:  Signs/Symptoms: percent weight loss, moderate muscle depletion, mild fat depletion Percent weight loss: 9.8 % Nutrition Interventions:  Interventions: Prostat, Boost Breeze but now transitioning to TPN in preparation for surgery for gastric adenocarcinoma.   Dyslipidemia Not on pharmacological treatment prior to admission  Normocytic anemia.  Likely due to his malignancy. No active bleeding.  Hgb stable ~10. Anemia studies ordered per Oncology.   Glaucoma.  Stable. Continue his home eye drops.    DVT prophylaxis: Lovenox [place]   Code Status:  Full  Family Communication:  Patient only  Disposition Plan/Expected LOS: Inpatient, patient presented with severe esophagitis with obstructive physiology and has recently undergone dilatation of same.  Had EUS with FNA showing malignant cells with diagnosis of gastric adenocarcinoma, likely localized. Underwent surgical intervention on 12/15.   Patient to be transferred to Surgery services as primary team on 12/16, [facility] team to monitor overnight.   Consultants: General surgery-signed off 12/8, reconsulted on 12/11 Gastroenterology Oncology   Procedures: 12/4 EGD 12/8 EGD with dilatation 12/15 ex lap with gastrectomy, lymph node disection, G and J  tube placement.  Cultures: SARS-CoV-2 negative  Antibiotics: Anti-infectives (From admission, onward)   Start     Dose/Rate Route Frequency Ordered Stop   03/19/19 0600  cefoTEtan (CEFOTAN) 2 g in sodium chloride 0.9 % 100 mL IVPB     2 g 200 mL/hr over 30 Minutes Intravenous On call to O.R. 03/18/19 1448 03/19/19 1255      Objective: Blood pressure 106/78, pulse 66, temperature 98.5 F (36.9 C), temperature source Oral, resp. rate 15, height 5\' 8"  (1.727 m), weight 77.8 kg, SpO2 100 %.  Intake/Output Summary (Last 24 hours) at 03/19/2019 1314 Last data filed at 03/19/2019 0300 Gross per 24 hour  Intake 1164.87 ml  Output --  Net 1164.87 ml     Exam: (prior to surgery) Gen: No acute respiratory distress  Chest: No respiratory distress, no wheezing.  Cardiac: Regular rate and rhythm, S1-S2 Abdomen: Soft nontender nondistended, no ascites Extremities: Symmetrical in appearance without cyanosis, clubbing or effusion  Scheduled Meds:  Scheduled Meds: . bupivacaine liposome  20 mL Infiltration Once  . [MAR Hold] Chlorhexidine Gluconate Cloth  6 each Topical Daily  . [MAR Hold] enoxaparin (LOVENOX) injection  40 mg Subcutaneous Q24H  . [MAR Hold] feeding supplement  1 Container Oral TID BM  . [MAR Hold] gabapentin  100 mg Oral Q12H  . [MAR Hold] insulin aspart  0-9 Units Subcutaneous Q6H  . [MAR Hold] latanoprost  1 drop Both Eyes QHS  . [MAR Hold] pantoprazole (PROTONIX) IV  40 mg Intravenous Q12H  . [MAR Hold] sodium chloride flush  10-40 mL Intracatheter Q12H   Continuous Infusions: . sodium chloride Stopped (03/12/19 1900)  .  dextrose 5 % and 0.9% NaCl 10 mL/hr at 03/16/19 1822  . lactated ringers 20 mL/hr at 03/19/19 1101  . TPN ADULT (ION) 85 mL/hr at 03/18/19 1705  . TPN ADULT (ION)      Data Reviewed: Basic Metabolic Panel: Recent Labs  Lab 03/15/19 0810 03/16/19 0541 03/17/19 0517 03/18/19 0457 03/19/19 0550  NA 140 140 140 139 137  K 3.7 3.9 4.4 4.4  4.2  CL 105 106 106 107 104  CO2 26 27 28 27 28   GLUCOSE 121* 144* 136* 149* 152*  BUN 11 8 9 14 18   CREATININE 0.84 0.80 0.94 0.88 0.82  CALCIUM 8.4* 8.5* 8.7* 8.7* 8.7*  MG 1.9 2.0 2.1 2.1 2.2  PHOS 2.6 2.9 3.2 3.7 3.4   Liver Function Tests: Recent Labs  Lab 03/14/19 0555 03/15/19 0810 03/16/19 0541 03/18/19 0457  AST 15 15 16  14*  ALT 11 12 13 13   ALKPHOS 36* 39 40 40  BILITOT 0.7 0.5 0.4 0.4  PROT 5.6* 5.4* 5.6* 5.8*  ALBUMIN 3.1* 2.9* 2.9* 3.2*   No results for input(s): LIPASE, AMYLASE in the last 168 hours. No results for input(s): AMMONIA in the last 168 hours. CBC: Recent Labs  Lab 03/14/19 0555 03/15/19 0810 03/16/19 0541 03/18/19 0457 03/19/19 0550  WBC 3.8* 3.8* 3.7* 3.6* 4.3  NEUTROABS  --   --  2.0 1.6*  --   HGB 11.1* 10.9* 11.0* 11.1* 11.3*  HCT 34.7* 34.2* 33.7* 34.6* 35.5*  MCV 88.3 88.1 87.3 86.9 88.1  PLT 172 183 172 164 169   Cardiac Enzymes: No results for input(s): CKTOTAL, CKMB, CKMBINDEX, TROPONINI in the last 168 hours. BNP (last 3 results) No results for input(s): BNP in the last 8760 hours.  ProBNP (last 3 results) No results for input(s): PROBNP in the last 8760 hours.  CBG: Recent Labs  Lab 03/18/19 0504 03/18/19 1212 03/18/19 1801 03/18/19 2356 03/19/19 0555  GLUCAP 141* 123* 88 119* 135*    No results found for this or any previous visit (from the past 240 hour(s)).    Time spent :  15 minutes  Blain Pais, MD [facility]  **If unable to reach the above provider after paging please contact the [place] @ [phone]  On-Call/Text Page:      [email or web address]      password [email or web address]  If 7PM-7AM, please contact night-coverage [email or web address] Password [email or web address] 03/19/2019, 1:14 PM   LOS: 12 days

## 2019-03-19 NOTE — Transfer of Care (Signed)
Immediate Anesthesia Transfer of Care Note  Patient: SOURISH MONIER  Procedure(s) Performed: Procedure(s): EXPLORATORY LAPAROTOMY, distal GASTRECTOMY AND PLACEMENT OF G AND J TUBE, gastric jejunostomy (N/A)  Patient Location: PACU  Anesthesia Type:General  Level of Consciousness:  sedated, patient cooperative and responds to stimulation  Airway & Oxygen Therapy:Patient Spontanous Breathing and Patient connected to face mask oxgen  Post-op Assessment:  Report given to PACU RN and Post -op Vital signs reviewed and stable  Post vital signs:  Reviewed and stable  Last Vitals:  Vitals:   03/19/19 0557 03/19/19 1055  BP: 108/65 106/78  Pulse: 67 66  Resp: 17 15  Temp: 37.5 C 36.9 C  SpO2: [id number]    Complications: No apparent anesthesia complications

## 2019-03-19 NOTE — Anesthesia Preprocedure Evaluation (Addendum)
Anesthesia Evaluation  Patient identified by MRN, date of birth, ID band Patient awake    Reviewed: Allergy & Precautions, NPO status , Patient's Chart, lab work & pertinent test results  History of Anesthesia Complications Negative for: history of anesthetic complications  Airway Mallampati: I  TM Distance: >3 FB Neck ROM: Full    Dental  (+) Chipped, Teeth Intact, Dental Advisory Given,    Pulmonary neg recent URI, former smoker,    breath sounds clear to auscultation       Cardiovascular hypertension, Pt. on medications (-) angina(-) Past MI and (-) CHF  Rhythm:Regular     Neuro/Psych neg Seizures  Neuromuscular disease    GI/Hepatic Neg liver ROS, GERD  ,GASTRIC CANCER   Endo/Other  diabetes, Type 2  Renal/GU negative Renal ROS     Musculoskeletal  (+) Arthritis ,   Abdominal   Peds  Hematology  (+) Blood dyscrasia, anemia ,   Anesthesia Other Findings 2016: Myocardial perfusion is abnormal. Small, mild mostly fixed inferoapical attenuation artifact. No significant reversible ischemia. This is a low risk study. Overall left ventricular systolic function was normal. LVEF 50%. LV cavity size is mildly enlarged. There is no prior study for comparison.  2017: - Left ventricle: The cavity size was normal. There was mild focal   basal hypertrophy of the septum. Systolic function was normal.   The estimated ejection fraction was in the range of 55% to 60%.   Wall motion was normal; there were no regional wall motion   abnormalities. Doppler parameters are consistent with abnormal   left ventricular relaxation (grade 1 diastolic dysfunction). - Right atrium: The atrium was mildly dilated.  Reproductive/Obstetrics                            Anesthesia Physical Anesthesia Plan  ASA: III  Anesthesia Plan: General   Post-op Pain Management:    Induction: Intravenous, Rapid sequence and  Cricoid pressure planned  PONV Risk Score and Plan: 2 and Dexamethasone and Ondansetron  Airway Management Planned: Oral ETT  Additional Equipment: None  Intra-op Plan:   Post-operative Plan: Extubation in OR  Informed Consent: I have reviewed the patients History and Physical, chart, labs and discussed the procedure including the risks, benefits and alternatives for the proposed anesthesia with the patient or authorized representative who has indicated his/her understanding and acceptance.     Dental advisory given  Plan Discussed with: CRNA and Surgeon  Anesthesia Plan Comments:        Anesthesia Quick Evaluation

## 2019-03-20 ENCOUNTER — Encounter: Payer: Self-pay | Admitting: *Deleted

## 2019-03-20 LAB — GLUCOSE, CAPILLARY
Glucose-Capillary: 257 mg/dL — ABNORMAL HIGH (ref 70–99)
Glucose-Capillary: 154 mg/dL — ABNORMAL HIGH (ref 70–99)
Glucose-Capillary: 214 mg/dL — ABNORMAL HIGH (ref 70–99)
Glucose-Capillary: 187 mg/dL — ABNORMAL HIGH (ref 70–99)
Glucose-Capillary: 199 mg/dL — ABNORMAL HIGH (ref 70–99)

## 2019-03-20 LAB — CBC
HCT: 32.6 % — ABNORMAL LOW (ref 39.0–52.0)
HCT: 32.1 % — ABNORMAL LOW (ref 39.0–52.0)
Hemoglobin: 10.6 g/dL — ABNORMAL LOW (ref 13.0–17.0)
Hemoglobin: 10.3 g/dL — ABNORMAL LOW (ref 13.0–17.0)
MCH: 28.5 pg (ref 26.0–34.0)
MCH: 28.1 pg (ref 26.0–34.0)
MCHC: 32.5 g/dL (ref 30.0–36.0)
MCHC: 32.1 g/dL (ref 30.0–36.0)
MCV: 87.6 fL (ref 80.0–100.0)
MCV: 87.5 fL (ref 80.0–100.0)
Platelets: 157 10*3/uL (ref 150–400)
Platelets: 150 10*3/uL (ref 150–400)
RBC: 3.72 MIL/uL — ABNORMAL LOW (ref 4.22–5.81)
RBC: 3.67 MIL/uL — ABNORMAL LOW (ref 4.22–5.81)
RDW: 12.2 % (ref 11.5–15.5)
RDW: 12.1 % (ref 11.5–15.5)
WBC: 10.4 10*3/uL (ref 4.0–10.5)
WBC: 10.2 10*3/uL (ref 4.0–10.5)
nRBC: 0 % (ref 0.0–0.2)
nRBC: 0 % (ref 0.0–0.2)

## 2019-03-20 LAB — MRSA PCR SCREENING: MRSA by PCR: NEGATIVE

## 2019-03-20 LAB — MAGNESIUM: Magnesium: 2 mg/dL (ref 1.7–2.4)

## 2019-03-20 LAB — PHOSPHORUS: Phosphorus: 2.5 mg/dL (ref 2.5–4.6)

## 2019-03-20 MED ORDER — ONDANSETRON HCL 4 MG/2ML IJ SOLN
4.0000 mg | Freq: Four times a day (QID) | INTRAMUSCULAR | Status: DC | PRN
  Administered 2019-03-20 – 2019-03-28 (×4): 4 mg via INTRAVENOUS
  Filled 2019-03-20 (×4): qty 2

## 2019-03-20 MED ORDER — VITAL HIGH PROTEIN PO LIQD
1000.0000 mL | ORAL | Status: DC
  Administered 2019-03-20 – 2019-03-21 (×2): 1000 mL via JEJUNOSTOMY
  Filled 2019-03-20 (×2): qty 1000

## 2019-03-20 MED ORDER — ONDANSETRON HCL 4 MG/2ML IJ SOLN: 4.0000 mg | Freq: Four times a day (QID) | INTRAMUSCULAR | Status: DC

## 2019-03-20 MED ORDER — TRAVASOL 10 % IV SOLN
INTRAVENOUS | Status: DC
  Filled 2019-03-20: qty 958.8

## 2019-03-20 MED ORDER — ENOXAPARIN SODIUM 40 MG/0.4ML ~~LOC~~ SOLN
40.0000 mg | SUBCUTANEOUS | Status: DC
  Administered 2019-03-20 – 2019-04-04 (×16): 40 mg via SUBCUTANEOUS
  Filled 2019-03-20 (×16): qty 0.4

## 2019-03-20 MED ORDER — ORAL CARE MOUTH RINSE
15.0000 mL | Freq: Two times a day (BID) | OROMUCOSAL | Status: DC
  Administered 2019-03-20 – 2019-04-04 (×31): 15 mL via OROMUCOSAL

## 2019-03-20 MED ORDER — CHLORHEXIDINE GLUCONATE CLOTH 2 % EX PADS
6.0000 | MEDICATED_PAD | Freq: Every day | CUTANEOUS | Status: DC
  Administered 2019-03-20 – 2019-04-04 (×16): 6 via TOPICAL

## 2019-03-20 NOTE — TOC Initial Note (Signed)
Transition of Care [facility]) - Initial/Assessment Note    Patient Details  Name: Jonathan Lewis MRN: [id number] Date of Birth: 30-Jan-1941  Transition of Care [facility]) CM/SW Contact:    Lynnell Catalan, RN Phone Number: 03/20/2019, 11:57 AM  Clinical Narrative:                 Pt from home with wife. Pt seen walking in the hall with walker. Independent with ADLs.   Expected Discharge Plan: Home/Self Care Barriers to Discharge: Continued Medical Work up    Expected Discharge Plan and Services Expected Discharge Plan: Home/Self Care       Prior Living Arrangements/Services   Lives with:: Spouse                   Activities of Daily Living Home Assistive Devices/Equipment: Eyeglasses ADL Screening (condition at time of admission) Patient's cognitive ability adequate to safely complete daily activities?: Yes Is the patient deaf or have difficulty hearing?: No Does the patient have difficulty seeing, even when wearing glasses/contacts?: No Does the patient have difficulty concentrating, remembering, or making decisions?: No Patient able to express need for assistance with ADLs?: Yes Does the patient have difficulty dressing or bathing?: No Independently performs ADLs?: Yes (appropriate for developmental age) Does the patient have difficulty walking or climbing stairs?: Yes(secondary to weakness and not being able to eat) Weakness of Legs: Both Weakness of Arms/Hands: None  Admission diagnosis:  Gastric outlet obstruction [K31.1] H/O exploratory laparotomy [Z98.890] Patient Active Problem List   Diagnosis Date Noted  . H/O exploratory laparotomy 03/19/2019  . Malnutrition of moderate degree 03/08/2019  . Gastric outlet obstruction 03/07/2019  . Weight loss 03/05/2019  . Nausea and vomiting 03/05/2019  . Decreased appetite 03/05/2019  . Epigastric pain 12/05/2018  . Hyperglycemia 12/05/2018  . Left lumbar radiculopathy 01/02/2018  . Leg pain, bilateral 11/08/2017  .  Bursitis of right hip 05/10/2017  . Left shoulder pain 05/10/2017  . Physical deconditioning 05/10/2017  . Wheezing 12/13/2016  . Cough 11/03/2016  . Neck pain on left side 11/03/2016  . Bilateral leg paresthesia 11/10/2015  . Acoustic neuroma ([place]) 06/25/2015  . Hematochezia 05/13/2015  . Abnormal finding on MRI of brain 05/13/2015  . Vertigo 05/05/2015  . GERD (gastroesophageal reflux disease) 05/05/2015  . BPH (benign prostatic hyperplasia) 05/05/2015  . Bradycardia   . Chest pain 07/18/2014  . Allergic rhinitis 07/18/2014  . Pain of right thumb 07/18/2014  . Male hypogonadism 07/16/2014  . Preventative health care 07/17/2013  . Right sided sciatica 07/17/2013  . Constipation 01/21/2012  . Leukopenia 06/11/2011  . HEARING LOSS 04/10/2009  . GROIN PAIN 01/26/2009  . Osteoarthritis, hand 10/27/2008  . LOW BACK PAIN, CHRONIC 10/27/2008  . Diabetes ([place]) 07/17/2008  . Hyperlipidemia 06/06/2007  . EXTERNAL HEMORRHOIDS 02/15/2007  . BENIGN PROSTATIC HYPERTROPHY 02/15/2007  . ERECTILE DYSFUNCTION 10/31/2006  . Hypertension complicating diabetes ([place]) 10/31/2006   PCP:  Biagio Borg, MD Pharmacy:   [id number] - [place], [name] [address] Phone: [phone] Fax: [phone]     Social Determinants of Health ([place]) Interventions    Readmission Risk Interventions Readmission Risk Prevention Plan 03/20/2019  Transportation Screening Complete  PCP or Specialist Appt within 5-7 Days Complete  Home Care Screening Complete  Medication Review (RN CM) Complete  Some recent data might be hidden

## 2019-03-20 NOTE — Progress Notes (Signed)
Nutrition Follow-up  DOCUMENTATION CODES:   Non-severe (moderate) malnutrition in context of acute illness/injury  INTERVENTION:  Initiate Vital HP @ 20 ml/hr (480 ml/day) via J-tube  TPN at goal 85 ml/hr   Provides 2525 kcal (120% of estimated needs) 138 grams of protein, and 403 ml free H20 from formula  NUTRITION DIAGNOSIS:   Moderate Malnutrition related to acute illness as evidenced by percent weight loss, moderate muscle depletion, mild fat depletion.  Ongoing  GOAL:   Patient will meet greater than or equal to 90% of their needs  Met with TPN  MONITOR:   PO intake, Supplement acceptance, Diet advancement, Labs, Weight trends  REASON FOR ASSESSMENT:   Consult Enteral/tube feeding initiation and management  ASSESSMENT:  RD working remotely.  78 y.o. male with medical history significant of GERD, HTN, hyperglycemia not on any diabetic medication, acoustic neuroma, and BPH. Patient was admitted for persistent N/V and burping x1 month. He reports decreased appetite and 20 lb weight loss in that time frame. CT abdomen showed GOO from a possible mass.   12/11- PICC 12/11-TPN 12/4 -EGD 12/8 -EGD  12/10 - Pylorus biopsy showed evidence of gastric adenocarcinoma  Patient is s/p ex lap with distal gastrectomy and Billroth II GJ with portal lymph node dissection and placement of 24 FG-tube and separate J-tube on 12/15  RD consulted for initiation of trophic feeds. Per chart J-tube currently hooked up to pedialyte infusion. Patient noted not having much pain this morning, no BM but passing flatus this afternoon per review of RN flowsheet.   TPN @ goal rate providing 2045 kcal and 96 grams of protein daily  I/Os: +1660 ml x 24 hrs NG output 150 ml x 24 hrs UOP: 2900 x 24 hrs  Medications reviewed and include: Gabapentin 100 mg every 12 hrs, SSI, Protonix IV 40 mg every 12 hrs  Labs: CBGs 154-214 x 24 hrs, Hgb 10.3 (L)  Diet Order:   Diet Order            Diet NPO  time specified Except for: Ice Chips  Diet effective midnight              EDUCATION NEEDS:   Not appropriate for education at this time  Skin:  Skin Assessment: Reviewed RN Assessment  Last BM:  12/7  Height:   Ht Readings from Last 1 Encounters:  03/15/19 _0  (1.727 m)    Weight:   Wt Readings from Last 1 Encounters:  03/15/19 77.8 kg    Ideal Body Weight:  70 kg  BMI:  Body mass index is 26.08 kg/m.  Estimated Nutritional Needs:   Kcal:  1880-2110 kcal  Protein:  90-100 grams  Fluid:  >/= 2 L/day   Lajuan Lines, RD, LDN Clinical Nutrition [place] Telephone [phone] After Hours/Weekend Pager: [phone]

## 2019-03-20 NOTE — Progress Notes (Signed)
PHARMACY - TOTAL PARENTERAL NUTRITION CONSULT NOTE   Indication: GOO  Patient Measurements: Height: 5\' 8"  (172.7 cm) Weight: 171 lb 8.3 oz (77.8 kg) IBW/kg (Calculated) : 68.4 TPN AdjBW (KG): 77.8 Body mass index is 26.08 kg/m. Usual Weight:   Assessment:  Patient is a 78 y.o M presented to ED on 12/3 with c/o recurrent emesis and abdominal discomfort.  Abdominal CT on 12/2 showed findings consistent with "outlet obstruction or severe dysmotility" and suspected gastric mass. He underwent EGD with biopsy on 12/4 and repeat EUS/EGD with dilation/biopsy on 12/8.  Biopsy done on 12/8 came back with evidence of gastric adenocarcinoma.  CCS recom. surgery with this admssion and to start TPN on 12/11.   Glucose / Insulin: sensitive SSI q6h; cbgs 135-257, high post op - ? Due to pedialyte , 10 unit of Novolog in past 24 hours  Electrolytes: mag/phos WNL, no BMET 12/16;  12/15:electrolytes are WNL; Cl and CO2 wnl Renal: scr wnl LFTs / TGs: - AST/ALT wnl - TG 96 ( 12/12), 105 ( 12/14)  Prealbumin / albumin:  - albumin 2.9 (12/11), 3.2 ( 12/14)  - prealbumin 18 (12/11), 20.1 ( 12/14)  Intake / Output; MIVF: I/O + 1660, NGO 150 ml   GI Imaging: Surgeries / Procedures:  - 12/4 EGD: LA grade D esophagitis, severe stenosis was found at the pylorus, large amount of food residue in stomach, biopsies taken - 12/7: biopsies negative for cancer - 12/8: EUS and EGD with dilation (severe pylorus stenosis- biopsied and dilated) - 12/10: pylorus biopsy showed evidence of gastric adenocarcinoma - 12/15 Expl lap: distal gastrectomy with BII, G & J tube placement 12/16 pedialyte via tube 10 ml/hr  Central access: 12/11 PICC   TPN start date: 12/11   Nutritional Goals (per RD recommendation on 12/11): Kcal:  1880-2110 kcal Protein:  90-100 grams Fluid:  >/= 2 L/day  Goal TPN rate 85 mL/hr (provides 96 g of protein and 2045 kcals per day)  Current Nutrition:   NPO,TPN, pedialyte per tue 10  ml/hr  Plan:   Continue TPN at goal rate of 85 mL/hr    Electrolytes in TPN: 32mEq/L of Na, 55 mEq/L of K, 16mEq/L of Ca, 8 mEq/L of Mg, and 12 mmol/L of Phos. Cl:Ac ratio 1:1  Add MVI to TPN only MWF (due to backorder status). Add trace elements to TPN daily  Continue famotidine in TPN  Continue sSSI q6h and adjust as needed   Continue MIVF at 10 mL/hr or per CCS  TPN labs on Mon/Thurs  Eudelia Bunch, Pharm.D [phone] 03/20/2019 7:56 AM

## 2019-03-20 NOTE — Progress Notes (Signed)
[name], male   DOB: 01-17-1941, 78 y.o.   MRN: [id number]    1 Day Post-Op  Subjective: Patient looks good this morning.  Not having much pain.  No BM or flatus yet.  Has had good UOP overnight with no other issues.  ROS: See above, otherwise other systems negative  Objective: Vital signs in last 24 hours: Temp:  [98.1 F (36.7 C)-99.1 F (37.3 C)] 98.5 F (36.9 C) (12/16 0800) Pulse Rate:  [66-88] 76 (12/16 0600) Resp:  [10-15] 13 (12/16 0600) BP: (100-147)/(54-84) 113/58 (12/16 0600) SpO2:  [98 %-100 %] 100 % (12/16 0600) Last BM Date: 03/15/19  Intake/Output from previous day: 12/15 0701 - 12/16 0700 In: 4910.8 [I.V.:3785.4; NG/GT:85.3; IV Piggyback:1035.1] Out: 3250 [Urine:2900; Emesis/NG output:150; Blood:200] Intake/Output this shift: No intake/output data recorded.  PE: Gen: NAD Heart: regular Lungs: CTAB Abd: soft, appropriately tender, midline incision is c/d/i with staples present and a honeycomb dressing.  NGT with no output in the cannister, but minimal serosang in tubing.  g-tube to gravity with no current output and J-tube hooked up to pedialyte infusion. GU: foley with clear yellow urine output  Lab Results:  Recent Labs    03/19/19 2355 03/20/19 0630  WBC 10.4 10.2  HGB 10.6* 10.3*  HCT 32.6* 32.1*  PLT 157 150   BMET Recent Labs    03/19/19 0550 03/19/19 1829  NA 137 135  K 4.2 4.5  CL 104 102  CO2 28 25  GLUCOSE 152* 258*  BUN 18 19  CREATININE 0.82 0.94  CALCIUM 8.7* 8.4*   PT/INR No results for input(s): LABPROT, INR in the last 72 hours. CMP     Component Value Date/Time   NA 135 03/19/2019 1829   K 4.5 03/19/2019 1829   CL 102 03/19/2019 1829   CO2 25 03/19/2019 1829   GLUCOSE 258 (H) 03/19/2019 1829   BUN 19 03/19/2019 1829   CREATININE 0.94 03/19/2019 1829   CREATININE 1.06 07/16/2012 1038   CALCIUM 8.4 (L) 03/19/2019 1829   PROT 5.8 (L) 03/18/2019 0457   ALBUMIN 3.2 (L) 03/18/2019 0457   AST 14  (L) 03/18/2019 0457   ALT 13 03/18/2019 0457   ALKPHOS 40 03/18/2019 0457   BILITOT 0.4 03/18/2019 0457   GFRNONAA >60 03/19/2019 1829   GFRAA >60 03/19/2019 1829   Lipase     Component Value Date/Time   LIPASE 30 03/07/2019 1000       Studies/Results: No results found.  Anti-infectives: Anti-infectives (From admission, onward)   Start     Dose/Rate Route Frequency Ordered Stop   03/19/19 0600  cefoTEtan (CEFOTAN) 2 g in sodium chloride 0.9 % 100 mL IVPB     2 g 200 mL/hr over 30 Minutes Intravenous On call to O.R. 03/18/19 1448 03/19/19 1255       Assessment/Plan Hypertension - controlled right now Diabetes mellitus - SSI secondary to TNA and TFs  POD 1, s/p ex lap with distal gastrectomy and Billroth II GJ with portal lymph node dissection and placement of a 24FG-tube and separate J-tube, Dr. Redmond Pulling 03/19/19 for Gastric outlet obstruction secondary to adenocarcinoma -labs look good this am.  Mild anemia with hgb around 10.5. -DC foley today as he has good UOP -DC NGT today and leave g-tube to gravity -J-tube with pedialyte currently, but will transition to trophic TFs today via J-tube.  Nutrition consult to initiate this -ambulate in halls TID -pulm toilet and IS -cont multimodal pain control -await  pathology from surgery, however there was some concern on a frozen section of portal LN for met adenoca -oncology following  FEN-trophic J-tube feeds, TNA VTE-Lovenox ID-none currently needed Foley:DC today, POD 1 Follow up:Dr. Redmond Pulling   LOS: 13 days    Henreitta Cea , [facility] Surgery 03/20/2019, 8:51 AM Please see Amion for pager number during day hours 7:00am-4:30pm

## 2019-03-20 NOTE — Op Note (Signed)
NAME: Jonathan Lewis, BRZOSKA MEDICAL RECORD [id number] ACCOUNT [id number] DATE OF BIRTH:02/12/1941 FACILITY: [facility] LOCATION: [facility] PHYSICIAN:Tracye Szuch Ronnie Derby, MD  OPERATIVE REPORT  DATE OF PROCEDURE:  03/19/2019  PREOPERATIVE DIAGNOSIS:  Distal gastric cancer.  POSTOPERATIVE DIAGNOSIS:  Distal gastric cancer.  PROCEDURE: 1.  Exploratory laparotomy. 2.  Distal gastrectomy with Billroth II gastrojejunostomy. 3.  Portal lymph node dissection. 4.  Placement of a 24-French gastrostomy tube. 5.  Placement of a feeding jejunostomy tube.  SURGEON:  Greer Pickerel, MD  PRIMARY ASSISTANT:  Dr. Alphonsa Overall.  OTHER ASSISTANTS:  Johnathan Hausen MD , Dr. Donnie Mesa, Saverio Danker, PA-C  ANESTHESIA:  General.  ESTIMATED BLOOD LOSS:  200 mL.  BLOOD ADMINISTERED:  None.  DRAINS:  Patient has an NG tube, Foley catheter, 24-French MIC gastrotomy tube and a 14-French red rubber serving as a jejunostomy tube.    LOCAL MEDICATIONS:  Local medications are Marcaine mixed with epinephrine infiltrated in a preperitoneal space as a TAP block.  SPECIMENS: 1.  Small bowel nodule. 2.  Distal stomach and D1 of duodenum. 3.  Portal hepatic lymph node. 4.  Deep porta hepatic lymph node. 5.  Superior pancreatic lesion, which was along the neck of the superior pancreas.  INDICATIONS:  The patient is a pleasant 78 year old gentleman who has had progressive weight loss as well as emesis and presented and was found to have a gastric outlet obstruction.  He was admitted to the hospital and he was found to have a gastric  outlet obstruction on CT scan that was concerning for a tumor in his pyloric region.  He underwent endoscopy and EUS and biopsy, which confirmed adenocarcinoma.  EUS did not demonstrate any overt lymphadenopathy, but it was inconclusive due to retained  food.  A CT of the abdomen and chest revealed no overt signs of lymphadenopathy or distal spread.  Because he had a gastric outlet obstruction,  we recommended same hospitalization a distal gastrectomy with reconstruction and G-tube and J-tube placement.   I had a long conversation with the patient and his wife regarding operative risk including but not limited to bleeding, infection, injury to surrounding structures, blood clot formation, duodenal stump leak, anastomotic leak, anastomotic stricture,  gastric ileus, feeding tube complications, incisional hernia wound infection, perioperative cardiac and pulmonary events, dumping syndrome, afferent limb syndrome and efferent limb syndrome, need for adjuvant treatment as well as prolonged  hospitalization and death.  DESCRIPTION OF OPERATIVE FINDINGS:  The patient had a small bulky tumor in his pylorus region.  There were no signs of overt metastasis.  On initial exploration, there was small bowel adhesion, which was removed and sent for frozen, which was negative  for metastatic disease so, therefore, we proceeded.  He did have bulky lymph node along the porta hepatis between the confluence of the portal vein and the common hepatic artery.  There was also a nodule along the superior edge of the pancreas.  I was able to  sharply excise most of it, but there was a small amount of residual firmness left in this area and that nodule that had been removed and that location was labeled superior pancreatic lesion.  A clip was also placed at the base of the deep portal hepatic  node that had been removed as a deep margin.  DESCRIPTION OF PROCEDURE:  The patient was taken to OR 4 at [facility].  General anesthesia was established.  Sequential compression devices had been placed.  The patient had been on prophylactic Lovenox  for the past several days.  His abdomen  was prepped and draped in the general standard surgical fashion with ChloraPrep.  He received IV antibiotic prior to skin incision.  A surgical timeout was performed.  An incision was made from xiphoid down to the umbilicus with a 10  blade.  Subcutaneous  tissue was divided with electrocautery.  Fascia was incised and the abdominal cavity was entered.  A Bookwalter retractor was placed.  The omentum was inspected.  No evidence of disease in the omentum.  Dr. Lucia Gaskins had joined me in the operating room at  this point.  The liver, both the left and right lobes, were palpated and inspected.  No obvious gross disease.  Upper stomach was palpated.  Spleen was palpated.  No nodules felt.  Small bowel was ran from the ligament of Treitz to the terminal ileum.   Again, no abnormalities other than what appeared to be an adhesive band in the mid small bowel.  This was taken off and sent for frozen and subsequently came back as negative.  The ascending colon, transverse colon, descending and sigmoid colon appeared  normal.  There were no peritoneal implants.  At this point, I decided to proceed with resection of the mass.  The omentum was taken off the transverse colon.  This was done with a combination of electrocautery and Harmonic scalpel.  I was able to get in the avascular plane and get into the lesser sac.  Some of the short gastric vessels along the greater  curvature, around the antrum were taken down with Harmonic scalpel.  In palpating the tumor and using a ruler, I measured approximately 5 cm proximal to this and identified an area in the stomach for planned transection.  This was around the incisura.  I  took down the short gastric vessels along the greater curvature to this level staying anterior to away, that way we incorporated some perigastric tissue in this location.  I then turned my attention to kocherizing the duodenum.  The hepatoduodenal  ligament was taken down with a combination of electrocautery as well as Harmonic scalpel.  We were able to kocherize the duodenum using a right angle and electrocautery as well as Harmonic scalpel.  I incised the gastrohepatic ligament and mobilized and  took it down with the Harmonic  scalpel along the lesser curve to the area of planned stapling around the incisura.  I ended up having anesthesia pull back the nasogastric tube to ensure that it was not going to be in the staple line where I transected  the stomach.  Using an [employer] stapler with a green load, I started transecting the stomach along the greater curvature angling toward the incisura.  This took three fires of the 60 mm Echelon with green loads.  I was then able to continue  working on mobilizing the duodenum and developing a plane infrapyloric.  This was around the pancreatic head.  This was carefully and tediously done with a right angle and tonsil and Harmonic scalpel as needed.  There was some inflammation in this area,  but I was able to create a window behind the first portion of the duodenum about a centimeter or 1.5 cm distal to the mass in the pylorus.  This was away from the ampulla.  There was a little bit of bleeding from the pancreatic head.  Some  electrocautery was used along with 3-0 silk sutures.  At this time, I stapled across the duodenum in  the D1 location using an Echelon 60 stapler with a blue load.  This completely freed the specimen.  I had previously taken down the right gastroepiploic  and tied it off with 2-0 silk sutures.  The specimen was passed off the field and it was sent for gross analysis to look at the duodenal margin.  The patient had what appeared to be bulky lymph node in the porta hepatis area.  Dr. Lucia Gaskins and scrubbed out  at this point and Dr. Georgette Dover joined me while I dissected out the porta hepatis area.  I identified the common hepatic and the right gastric vessel as well as the gastroduodenal.  The right gastric artery was taken prior to excising the specimen.  It was  ligated with a 2-0 silk suture ligature as well as a 2-0 silk tie.  The gastroduodenal artery was preserved.  Using a right angle, I was able to carefully tease out the lymph node at the confluence of the  porta hepatis.  It was enlarged, approximately  about 2 cm.  It was mobilized and isolated from surrounding structures using right angle and cautery and some harmonic clips.  It was sent for frozen.  There was a little bit of small amount of residual portal hepatic lymph node left.  I was able to  excise a little bit more sharply with a knife and then put a clip underneath proximally.  In palpating the area, there is firmness along the superior border of the pancreas in the neck.  It was hard to tell if this was a lymph node or part of the  pancreas.  I was able to excise a little bit of this carefully sharply.  There was still a little bit of firmness left.  I did not feel comfortable going further down into the pancreatic neck.  Hemostasis was good.  This area that had been removed along  the superior edge of the pancreas was labeled as superior pancreatic lesion.  The area was irrigated.  There was no evidence of bleeding.  At this point my current had to leave, so Dr. Hassell Done joined me in the operating room to assist in surgical  reconstruction.  I decided to do an antecolic Billroth II gastrojejunostomy in an isoperistaltic fashion, meaning that the short afferent limb was going to be anastomosed to the greater curvature and the efferent limb to the lesser curvature.  The  proximal jejunum was brought up to the posterior wall of the gastric remnant and turned in such a way that the afferent limb was lined up to the greater curvature and the efferent limb to the lesser curve.  Then, 3-0 silk sutures were placed as stay  sutures.  Enterotomy was made in the jejunum and an enterotomy was made in the posterior wall of the gastric remnant about 3 cm from the staple line.  One limb of an Echelon 60 mm stapler with a blue load was placed through each of the enterotomies and  the stapler was brought together and fired to create a common channel.  The common opening was then closed with interrupted 3-0 Vicryl  sutures and then oversewed with interrupted 3-0 silk sutures in a Lembert fashion.  The anastomosis was patent.   Because of concerns for potential gastric ileus, a G-tube was placed along the greater curvature of the stomach about 2-3 inches above the anastomosis.  It was placed in the typical Stamm fashion.  A 2-0 silk pursestring was placed along the greater  curvature about 3 inches above the anastomosis.  A gastrotomy was made.  A 24 French MIC gastritis tube was brought through the left upper quadrant abdominal wall about 2 inches below the left subcostal margin.  After a gastrotomy was made, it was  advanced into the stomach.  The pursestring suture was tied down.  It was then secured to the left upper quadrant abdominal wall with three interrupted 2-0 silk sutures.  The balloon was inflated with 7 mL of saline.  The balloon was maintaining shape.   There was no tension on the gastrotomy tube.  We then decided to place a feeding jejunostomy tube approximately about 35-40 cm distal to the gastrojejunostomy and down the efferent limb.  An area was identified the antimesenteric border of the jejunum.   A 2-0 silk pursestring was placed.  A small enterotomy was made in the jejunum.  A 14 French red rubber catheter had been brought to the left mid abdominal wall.  Some additional holes were made in the red rubber catheter.  It was then advanced through  the enterotomy and further downstream into the efferent limb.  The pursestring was tied down.  We then placed 3-0 silk sutures to Witzel the jejunostomy tube.  It was then anchored to the left mid abdominal wall with several 2-0 silk sutures in order to  prevent it from twisting onto itself.  The J-tube was then secured to the skin with 0 silk and same for the G-tube.  Abdomen was irrigated.  A mixture of Marcaine and Exparel was infiltrated in the preperitoneal space and the fascia in a circumferential  fashion.  The fascia was reapproximated with a  running looped #1 PDS, one from above and one from below.  Subcutaneous tissue was irrigated and the skin was closed with skin staples.  A honeycomb dressing was applied.  All needle, instrument and sponge  counts were correct x2.  There were no immediate complications.  The patient was transferred to the recovery room.  I was able to update the wife later after surgery.  Surgical assistants were needed due to the complexity of the hepatobiliary anatomy and identification of important vessels as well as with mobilizing tissue.  TN/NUANCE  D:03/19/2019 T:03/20/2019 JOB:[id number]

## 2019-03-21 LAB — COMPREHENSIVE METABOLIC PANEL
ALT: 16 U/L (ref 0–44)
AST: 14 U/L — ABNORMAL LOW (ref 15–41)
Albumin: 3.1 g/dL — ABNORMAL LOW (ref 3.5–5.0)
Alkaline Phosphatase: 40 U/L (ref 38–126)
Anion gap: 7 (ref 5–15)
BUN: 16 mg/dL (ref 8–23)
CO2: 25 mmol/L (ref 22–32)
Calcium: 8.2 mg/dL — ABNORMAL LOW (ref 8.9–10.3)
Chloride: 99 mmol/L (ref 98–111)
Creatinine, Ser: 0.77 mg/dL (ref 0.61–1.24)
GFR calc Af Amer: >60 mL/min (ref >60)
GFR calc non Af Amer: >60 mL/min (ref >60)
Glucose, Bld: 226 mg/dL — ABNORMAL HIGH (ref 70–99)
Potassium: 4.5 mmol/L (ref 3.5–5.1)
Sodium: 131 mmol/L — ABNORMAL LOW (ref 135–145)
Total Bilirubin: 0.6 mg/dL (ref 0.3–1.2)
Total Protein: 5.9 g/dL — ABNORMAL LOW (ref 6.5–8.1)

## 2019-03-21 LAB — MAGNESIUM: Magnesium: 1.9 mg/dL (ref 1.7–2.4)

## 2019-03-21 LAB — CBC
HCT: 32.3 % — ABNORMAL LOW (ref 39.0–52.0)
Hemoglobin: 10.4 g/dL — ABNORMAL LOW (ref 13.0–17.0)
MCH: 28.1 pg (ref 26.0–34.0)
MCHC: 32.2 g/dL (ref 30.0–36.0)
MCV: 87.3 fL (ref 80.0–100.0)
Platelets: 149 10*3/uL — ABNORMAL LOW (ref 150–400)
RBC: 3.7 MIL/uL — ABNORMAL LOW (ref 4.22–5.81)
RDW: 12.2 % (ref 11.5–15.5)
WBC: 9.1 10*3/uL (ref 4.0–10.5)
nRBC: 0 % (ref 0.0–0.2)

## 2019-03-21 LAB — PHOSPHORUS: Phosphorus: 1.9 mg/dL — ABNORMAL LOW (ref 2.5–4.6)

## 2019-03-21 LAB — GLUCOSE, CAPILLARY
Glucose-Capillary: 205 mg/dL — ABNORMAL HIGH (ref 70–99)
Glucose-Capillary: 219 mg/dL — ABNORMAL HIGH (ref 70–99)
Glucose-Capillary: 220 mg/dL — ABNORMAL HIGH (ref 70–99)
Glucose-Capillary: 238 mg/dL — ABNORMAL HIGH (ref 70–99)
Glucose-Capillary: 241 mg/dL — ABNORMAL HIGH (ref 70–99)

## 2019-03-21 MED ORDER — SODIUM PHOSPHATES 45 MMOLE/15ML IV SOLN
20.0000 mmol | Freq: Once | INTRAVENOUS | Status: AC
  Administered 2019-03-21: 20 mmol via INTRAVENOUS
  Filled 2019-03-21: qty 6.67

## 2019-03-21 MED ORDER — TRAVASOL 10 % IV SOLN
INTRAVENOUS | Status: AC
  Filled 2019-03-21: qty 958.8

## 2019-03-21 MED ORDER — INSULIN ASPART 100 UNIT/ML ~~LOC~~ SOLN
0.0000 [IU] | Freq: Four times a day (QID) | SUBCUTANEOUS | Status: DC
  Administered 2019-03-21 – 2019-03-22 (×4): 5 [IU] via SUBCUTANEOUS

## 2019-03-21 MED ORDER — GABAPENTIN 250 MG/5ML PO SOLN
100.0000 mg | Freq: Two times a day (BID) | ORAL | Status: DC
  Administered 2019-03-22 – 2019-04-04 (×23): 100 mg
  Filled 2019-03-21 (×30): qty 2

## 2019-03-21 NOTE — Progress Notes (Signed)
PHARMACY - TOTAL PARENTERAL NUTRITION CONSULT NOTE   Indication: GOO  Patient Measurements: Height: 5\' 8"  (172.7 cm) Weight: 169 lb (76.7 kg) IBW/kg (Calculated) : 68.4 TPN AdjBW (KG): 77.8 Body mass index is 25.7 kg/m. Usual Weight:   Assessment:  Patient is a 78 y.o M presented to ED on 12/3 with c/o recurrent emesis and abdominal discomfort.  Abdominal CT on 12/2 showed findings consistent with "outlet obstruction or severe dysmotility" and suspected gastric mass. He underwent EGD with biopsy on 12/4 and repeat EUS/EGD with dilation/biopsy on 12/8.  Biopsy done on 12/8 came back with evidence of gastric adenocarcinoma.  CCS recom. surgery with this admssion and to start TPN on 12/11.   Glucose / Insulin: sensitive SSI q6h; cbgs 135-241, high post op - ? Due to pedialyte , 11 unit of Novolog in past 24 hours  Electrolytes: phos and Na low today, all other lytes including CCa WNL Renal: scr wnl LFTs / TGs: - AST/ALT wnl - TG 96 ( 12/12), 105 ( 12/14)  Prealbumin / albumin:  - albumin 2.9 (12/11), 3.2 ( 12/14)  - prealbumin 18 (12/11), 20.1 ( 12/14)  Intake / Output; MIVF: I/O + 4910, NGO 150 ml   GI Imaging: Surgeries / Procedures:  - 12/4 EGD: LA grade D esophagitis, severe stenosis was found at the pylorus, large amount of food residue in stomach, biopsies taken - 12/7: biopsies negative for cancer - 12/8: EUS and EGD with dilation (severe pylorus stenosis- biopsied and dilated) - 12/10: pylorus biopsy showed evidence of gastric adenocarcinoma - 12/15 Expl lap: distal gastrectomy with BII, G & J tube placement 12/16 pedialyte via tube 10 ml/hr  Central access: 12/11 PICC   TPN start date: 12/11   Nutritional Goals (per RD recommendation on 12/11): Kcal:  1880-2110 kcal Protein:  90-100 grams Fluid:  >/= 2 L/day  Goal TPN rate 85 mL/hr (provides 96 g of protein and 2045 kcals per day)  Current Nutrition:   NPO,TPN, pedialyte per tue 10 ml/hr  Plan:   Continue TPN  at goal rate of 85 mL/hr    Electrolytes in TPN: 50mEq/L of Na, 55 mEq/L of K, 34mEq/L of Ca, 8 mEq/L of Mg, and 12 mmol/L of Phos. Cl:Ac ratio 1:1  Add MVI to TPN only MWF (due to backorder status). Add trace elements to TPN daily  Continue famotidine in TPN  Change SSI to moderate  Continue MIVF at 10 mL/hr or per CCS  TPN labs on Mon/Thurs  Bmet with mag with am labs  Nickerson 03/21/2019, 9:42 AM

## 2019-03-21 NOTE — Progress Notes (Signed)
[name], male   DOB: 18-Nov-1940, 78 y.o.   MRN: [id number]    2 Days Post-Op  Subjective: Patient feels hot when insulin given to him.  Has some nausea today.  Ate a lot of ice chips already this morning.  Standing up on his own cleaning up when I arrived the first time.  Thought he would recover quicker than he is.  ROS: See above, otherwise other systems negative  Objective: Vital signs in last 24 hours: Temp:  [98.2 F (36.8 C)-100 F (37.8 C)] 99 F (37.2 C) (12/17 0515) Pulse Rate:  [68-98] 98 (12/17 0515) Resp:  [13-19] 19 (12/17 0515) BP: (103-161)/(59-82) 146/75 (12/17 0515) SpO2:  [99 %-100 %] 100 % (12/17 0515) Weight:  [76.7 kg] 76.7 kg (12/17 0515) Last BM Date: 03/15/19  Intake/Output from previous day: 12/16 0701 - 12/17 0700 In: 1314.7 [P.O.:62; I.V.:1252.7] Out: 1895 [Urine:1775; Drains:120] Intake/Output this shift: Total I/O In: -  Out: 250 [Urine:250]  PE: Gen: NAD, sitting up in a chair Heart: regular Lungs: CTAB Abd: soft, appropriately tender, incision is c/d/i with staples.  g-tube to gravity with bilious output.  J-tube with TFs running at 20cc/hr.    Lab Results:  Recent Labs    03/20/19 0630 03/21/19 0309  WBC 10.2 9.1  HGB 10.3* 10.4*  HCT 32.1* 32.3*  PLT 150 149*   BMET Recent Labs    03/19/19 1829 03/21/19 0309  NA 135 131*  K 4.5 4.5  CL 102 99  CO2 25 25  GLUCOSE 258* 226*  BUN 19 16  CREATININE 0.94 0.77  CALCIUM 8.4* 8.2*   PT/INR No results for input(s): LABPROT, INR in the last 72 hours. CMP     Component Value Date/Time   NA 131 (L) 03/21/2019 0309   K 4.5 03/21/2019 0309   CL 99 03/21/2019 0309   CO2 25 03/21/2019 0309   GLUCOSE 226 (H) 03/21/2019 0309   BUN 16 03/21/2019 0309   CREATININE 0.77 03/21/2019 0309   CREATININE 1.06 07/16/2012 1038   CALCIUM 8.2 (L) 03/21/2019 0309   PROT 5.9 (L) 03/21/2019 0309   ALBUMIN 3.1 (L) 03/21/2019 0309   AST 14 (L) 03/21/2019 0309   ALT 16  03/21/2019 0309   ALKPHOS 40 03/21/2019 0309   BILITOT 0.6 03/21/2019 0309   GFRNONAA >60 03/21/2019 0309   GFRAA >60 03/21/2019 0309   Lipase     Component Value Date/Time   LIPASE 30 03/07/2019 1000       Studies/Results: No results found.  Anti-infectives: Anti-infectives (From admission, onward)   Start     Dose/Rate Route Frequency Ordered Stop   03/19/19 0600  cefoTEtan (CEFOTAN) 2 g in sodium chloride 0.9 % 100 mL IVPB     2 g 200 mL/hr over 30 Minutes Intravenous On call to O.R. 03/18/19 1448 03/19/19 1255       Assessment/Plan Hypertension - controlled right now Diabetes mellitus - SSI secondary to TNA and TFs  POD 2, s/p ex lap with distal gastrectomy and Billroth II GJ with portal lymph node dissection and placement of a 24FG-tube and separate J-tube, Dr. Redmond Pulling 03/19/19 for Gastric outlet obstructionsecondary to adenocarcinoma -labs look good this am.   -cont g-tube to gravity and let him have ice and sips. -J-tube with trophic TFs at 20cc/hr.  Keep here for now with no flatus or BM yet -ambulate in halls TID -pulm toilet and IS -cont multimodal pain control -await pathology from surgery, however  there was some concern on a frozen section of portal LN for met adenoca -oncology following -discussed expected post op surgical course with the patient and expected outcomes etc.  FEN-trophic J-tube feeds, TNA, g-tube to gravity still VTE-Lovenox ID-none currently needed Foley:DC on POD 1 Follow up:Dr. Redmond Pulling   LOS: 14 days    Henreitta Cea , [facility] Surgery 03/21/2019, 9:37 AM Please see Amion for pager number during day hours 7:00am-4:30pm

## 2019-03-21 NOTE — Progress Notes (Signed)
Patient has elevated BP of 161/73, has PRN order for Hydralazine 5mg  Q6 for high blood pressure. 5mg  given @ 0435. Patient is stable and will continued to be monitored. Dawson Bills, RN

## 2019-03-22 LAB — BASIC METABOLIC PANEL
Anion gap: 6 (ref 5–15)
BUN: 17 mg/dL (ref 8–23)
CO2: 26 mmol/L (ref 22–32)
Calcium: 8.5 mg/dL — ABNORMAL LOW (ref 8.9–10.3)
Chloride: 103 mmol/L (ref 98–111)
Creatinine, Ser: 0.74 mg/dL (ref 0.61–1.24)
GFR calc Af Amer: >60 mL/min (ref >60)
GFR calc non Af Amer: >60 mL/min (ref >60)
Glucose, Bld: 214 mg/dL — ABNORMAL HIGH (ref 70–99)
Potassium: 4.4 mmol/L (ref 3.5–5.1)
Sodium: 135 mmol/L (ref 135–145)

## 2019-03-22 LAB — GLUCOSE, CAPILLARY
Glucose-Capillary: 156 mg/dL — ABNORMAL HIGH (ref 70–99)
Glucose-Capillary: 181 mg/dL — ABNORMAL HIGH (ref 70–99)
Glucose-Capillary: 186 mg/dL — ABNORMAL HIGH (ref 70–99)
Glucose-Capillary: 212 mg/dL — ABNORMAL HIGH (ref 70–99)
Glucose-Capillary: 227 mg/dL — ABNORMAL HIGH (ref 70–99)
Glucose-Capillary: 232 mg/dL — ABNORMAL HIGH (ref 70–99)

## 2019-03-22 LAB — PHOSPHORUS: Phosphorus: 2.7 mg/dL (ref 2.5–4.6)

## 2019-03-22 LAB — SURGICAL PATHOLOGY

## 2019-03-22 MED ORDER — TRAVASOL 10 % IV SOLN
INTRAVENOUS | Status: AC
  Filled 2019-03-22: qty 958.8

## 2019-03-22 MED ORDER — VITAL HIGH PROTEIN PO LIQD
1000.0000 mL | ORAL | Status: DC
  Administered 2019-03-22 – 2019-03-23 (×2): 1000 mL via JEJUNOSTOMY
  Filled 2019-03-22 (×2): qty 1000

## 2019-03-22 MED ORDER — INSULIN ASPART 100 UNIT/ML ~~LOC~~ SOLN
0.0000 [IU] | SUBCUTANEOUS | Status: DC
  Administered 2019-03-22: 3 [IU] via SUBCUTANEOUS
  Administered 2019-03-22: 5 [IU] via SUBCUTANEOUS
  Administered 2019-03-22 – 2019-03-23 (×3): 3 [IU] via SUBCUTANEOUS
  Administered 2019-03-23: 5 [IU] via SUBCUTANEOUS
  Administered 2019-03-23: 2 [IU] via SUBCUTANEOUS
  Administered 2019-03-23 (×2): 3 [IU] via SUBCUTANEOUS
  Administered 2019-03-24 (×3): 2 [IU] via SUBCUTANEOUS

## 2019-03-22 NOTE — Progress Notes (Signed)
[name], male   DOB: 12-13-1940, 78 y.o.   MRN: [id number]    3 Days Post-Op  Subjective: No new complaints.  Nausea has improved.  No flatus or BM.  Walked once yesterday.  Wanted to walk twice but couldn't get someone to help the second time.  Voiding well.  Pain is controlled.  ROS: See above, otherwise other systems negative  Objective: Vital signs in last 24 hours: Temp:  [98.7 F (37.1 C)-100.2 F (37.9 C)] 99.1 F (37.3 C) (12/18 0959) Pulse Rate:  [81-102] 84 (12/18 0959) Resp:  [15-18] 16 (12/18 0959) BP: (111-120)/(75-82) 116/77 (12/18 0959) SpO2:  [99 %-100 %] 100 % (12/18 0959) Weight:  [76.2 kg] 76.2 kg (12/18 0603) Last BM Date: 03/15/19  Intake/Output from previous day: 12/17 0701 - 12/18 0700 In: 1987.5 [I.V.:616.5; NG/GT:1090.7; IV Piggyback:280.2] Out: 3800 [Urine:2850; Drains:950] Intake/Output this shift: No intake/output data recorded.  PE: Heart: regular Lungs: CTAB Abd: soft, ND, appropriately tender, midline incision is c/d/i with staples.  g-tube with some bilious output, J-tube with TFs running.    Lab Results:  Recent Labs    03/20/19 0630 03/21/19 0309  WBC 10.2 9.1  HGB 10.3* 10.4*  HCT 32.1* 32.3*  PLT 150 149*   BMET Recent Labs    03/21/19 0309 03/22/19 0327  NA 131* 135  K 4.5 4.4  CL 99 103  CO2 25 26  GLUCOSE 226* 214*  BUN 16 17  CREATININE 0.77 0.74  CALCIUM 8.2* 8.5*   PT/INR No results for input(s): LABPROT, INR in the last 72 hours. CMP     Component Value Date/Time   NA 135 03/22/2019 0327   K 4.4 03/22/2019 0327   CL 103 03/22/2019 0327   CO2 26 03/22/2019 0327   GLUCOSE 214 (H) 03/22/2019 0327   BUN 17 03/22/2019 0327   CREATININE 0.74 03/22/2019 0327   CREATININE 1.06 07/16/2012 1038   CALCIUM 8.5 (L) 03/22/2019 0327   PROT 5.9 (L) 03/21/2019 0309   ALBUMIN 3.1 (L) 03/21/2019 0309   AST 14 (L) 03/21/2019 0309   ALT 16 03/21/2019 0309   ALKPHOS 40 03/21/2019 0309   BILITOT 0.6  03/21/2019 0309   GFRNONAA >60 03/22/2019 0327   GFRAA >60 03/22/2019 0327   Lipase     Component Value Date/Time   LIPASE 30 03/07/2019 1000       Studies/Results: No results found.  Anti-infectives: Anti-infectives (From admission, onward)   Start     Dose/Rate Route Frequency Ordered Stop   03/19/19 0600  cefoTEtan (CEFOTAN) 2 g in sodium chloride 0.9 % 100 mL IVPB     2 g 200 mL/hr over 30 Minutes Intravenous On call to O.R. 03/18/19 1448 03/19/19 1255       Assessment/Plan Hypertension- controlled right now Diabetes mellitus- SSI secondary to TNA and TFs  POD 3, s/p ex lap with distal gastrectomy and Billroth II GJ with portal lymph node dissection and placement of a 24FG-tube and separate J-tube, Dr. Redmond Pulling 03/19/19 forGastric outlet obstructionsecondary to adenocarcinoma -cont g-tube to gravity and let him have ice and sips secondary to gastric ileus -J-tube with trophic TFs increase to 30cc/hr.   -ambulate in halls TID -pulm toilet and IS -cont multimodal pain control -oncology following -Pathology: A. SMALL BOWEL, NODULE:  - Chronic inflammation, fibrosis, and calcifications.  - No malignancy identified.   B. DISTAL STOMACH AND DUODENUM, RESECTION:  - Invasive adenocarcinoma, moderately differentiated, spanning 3 cm.  - Tumor invades  into perigastric adipose.  - Perineural invasion present.  - Resection margins are negative.  - Metastatic carcinoma in two of twenty lymph nodes (2/20).  - Chronic active gastritis and ulcer.  - Immunohistochemistry is negative for Helicobacter pylori.  - See oncology table.   C. LYMPH NODE, PORTAL HEPATIC, EXCISION:  - Metastatic carcinoma in one of one lymph nodes (1/1).   D. PANCREAS, SUPERIOR LESION, BIOPSY:  - Metastatic carcinoma in one of one lymph nodes (1/1).   E. LYMPH NODE, DEEP PORTA HEPATIC, EXCISION:  - One of one lymph nodes negative for carcinoma (0/1).  FEN-trophic J-tube feeds, TNA, g-tube to  gravity still VTE-Lovenox ID-none currently needed Foley:DC on POD 1 Follow up:Dr. Redmond Pulling   LOS: 15 days    Henreitta Cea , [facility] 03/22/2019, 10:06 AM Please see Amion for pager number during day hours 7:00am-4:30pm

## 2019-03-22 NOTE — Progress Notes (Signed)
PHARMACY - TOTAL PARENTERAL NUTRITION CONSULT NOTE   Indication: GOO  Patient Measurements: Height: 5\' 8"  (172.7 cm) Weight: 167 lb 15.9 oz (76.2 kg) IBW/kg (Calculated) : 68.4 TPN AdjBW (KG): 77.8 Body mass index is 25.54 kg/m. Usual Weight:   Assessment:  Patient is a 78 y.o M presented to ED on 12/3 with c/o recurrent emesis and abdominal discomfort.  Abdominal CT on 12/2 showed findings consistent with "outlet obstruction or severe dysmotility" and suspected gastric mass. He underwent EGD with biopsy on 12/4 and repeat EUS/EGD with dilation/biopsy on 12/8.  Biopsy done on 12/8 came back with evidence of gastric adenocarcinoma.  CCS recom. surgery with this admssion and to start TPN on 12/11.   Glucose / Insulin:  cbgs 187-238,  20 unit of Novolog in past 24 hours  Electrolytes:  all lytes including CCa WNL (s/p Na Phos yest) Renal: scr wnl LFTs / TGs: - AST/ALT wnl - TG 96 ( 12/12), 105 ( 12/14)  Prealbumin / albumin:  - albumin 2.9 (12/11), 3.2 ( 12/14)  - prealbumin 18 (12/11), 20.1 ( 12/14)  Intake / Output; MIVF: I/O + 4910, NGO 150 ml   GI Imaging: Surgeries / Procedures:  - 12/4 EGD: LA grade D esophagitis, severe stenosis was found at the pylorus, large amount of food residue in stomach, biopsies taken - 12/7: biopsies negative for cancer - 12/8: EUS and EGD with dilation (severe pylorus stenosis- biopsied and dilated) - 12/10: pylorus biopsy showed evidence of gastric adenocarcinoma - 12/15 Expl lap: distal gastrectomy with BII, G & J tube placement - 12/16 pedialyte via tube 10 ml/hr  Central access: 12/11 PICC   TPN start date: 12/11   Nutritional Goals (per RD recommendation on 12/11): Kcal:  1880-2110 kcal Protein:  90-100 grams Fluid:  >/= 2 L/day  Goal TPN rate 85 mL/hr (provides 96 g of protein and 2045 kcals per day)  Current Nutrition:  NPO,  TPN, J tube feeds @ 20cc/hr, pedialyte @ 10 cc/hr  Plan:   Continue TPN at goal rate of 85 mL/hr     Electrolytes in TPN: 43mEq/L of Na, 55 mEq/L of K, 49mEq/L of Ca, 8 mEq/L of Mg, and 12 mmol/L of Phos. Cl:Ac ratio 1:1  Add MVI to TPN only MWF (due to backorder status). Add trace elements to TPN daily  Continue famotidine in TPN  Change SSI  Moderate to q4h  Continue MIVF at 10 mL/hr or per CCS  TPN labs on Mon/Thurs  BMet with mag and phos in am  Dolly Rias RPh 03/22/2019, 8:56 AM

## 2019-03-23 LAB — CBC
HCT: 31.3 % — ABNORMAL LOW (ref 39.0–52.0)
Hemoglobin: 9.9 g/dL — ABNORMAL LOW (ref 13.0–17.0)
MCH: 27.7 pg (ref 26.0–34.0)
MCHC: 31.6 g/dL (ref 30.0–36.0)
MCV: 87.7 fL (ref 80.0–100.0)
Platelets: 199 10*3/uL (ref 150–400)
RBC: 3.57 MIL/uL — ABNORMAL LOW (ref 4.22–5.81)
RDW: 12.3 % (ref 11.5–15.5)
WBC: 6.1 10*3/uL (ref 4.0–10.5)
nRBC: 0 % (ref 0.0–0.2)

## 2019-03-23 LAB — BASIC METABOLIC PANEL
Anion gap: 9 (ref 5–15)
BUN: 22 mg/dL (ref 8–23)
CO2: 25 mmol/L (ref 22–32)
Calcium: 8.5 mg/dL — ABNORMAL LOW (ref 8.9–10.3)
Chloride: 101 mmol/L (ref 98–111)
Creatinine, Ser: 0.67 mg/dL (ref 0.61–1.24)
GFR calc Af Amer: >60 mL/min (ref >60)
GFR calc non Af Amer: >60 mL/min (ref >60)
Glucose, Bld: 203 mg/dL — ABNORMAL HIGH (ref 70–99)
Potassium: 4.5 mmol/L (ref 3.5–5.1)
Sodium: 135 mmol/L (ref 135–145)

## 2019-03-23 LAB — MAGNESIUM: Magnesium: 2.2 mg/dL (ref 1.7–2.4)

## 2019-03-23 LAB — GLUCOSE, CAPILLARY
Glucose-Capillary: 191 mg/dL — ABNORMAL HIGH (ref 70–99)
Glucose-Capillary: 209 mg/dL — ABNORMAL HIGH (ref 70–99)
Glucose-Capillary: 194 mg/dL — ABNORMAL HIGH (ref 70–99)
Glucose-Capillary: 173 mg/dL — ABNORMAL HIGH (ref 70–99)
Glucose-Capillary: 145 mg/dL — ABNORMAL HIGH (ref 70–99)

## 2019-03-23 LAB — PHOSPHORUS: Phosphorus: 3.4 mg/dL (ref 2.5–4.6)

## 2019-03-23 MED ORDER — TRAVASOL 10 % IV SOLN
INTRAVENOUS | Status: AC
  Filled 2019-03-23: qty 451.2

## 2019-03-23 NOTE — Progress Notes (Signed)
     Assessment & Plan: POD#4 - s/p distal gastrectomy with B-II, G & J tube placement - Dr. Redmond Pulling 03/19/19  g-tube to gravity, ice and sips  J-tubewith trophic TFs increase to 30cc/hr  ambulate in halls TID, OOB to chair  TNA continues  Oncology following - adenocarcinoma with lymph node metastasis        Armandina Gemma, MD       [facility]       Office: [phone]   Chief Complaint: Gastric outlet obstruction, gastric adenocarcinoma  Subjective: Patient in room bathing, nurse at bedside.  No complaints.  Passed flatus this AM, no BM's.  TF's at 30cc/hr.  Objective: Vital signs in last 24 hours: Temp:  [98 F (36.7 C)-99.6 F (37.6 C)] 98.4 F (36.9 C) (12/19 0606) Pulse Rate:  [76-88] 88 (12/19 0606) Resp:  [16-18] 18 (12/19 0606) BP: (112-126)/(65-81) 126/70 (12/19 0606) SpO2:  [95 %-100 %] 95 % (12/19 0606) Last BM Date: 03/15/19  Intake/Output from previous day: 12/18 0701 - 12/19 0700 In: 2830.2 [I.V.:2377.2; NG/GT:453] Out: 4100 [Urine:2700; Drains:1400] Intake/Output this shift: Total I/O In: -  Out: 200 [Urine:200]  Physical Exam: HEENT - sclerae clear, mucous membranes moist Neck - soft Abdomen - soft without distension; wound dry and intact; J tube and G tube sites clear Ext - no edema, non-tender Neuro - alert & oriented, no focal deficits  Lab Results:  Recent Labs    03/21/19 0309 03/23/19 0401  WBC 9.1 6.1  HGB 10.4* 9.9*  HCT 32.3* 31.3*  PLT 149* 199   BMET Recent Labs    03/22/19 0327 03/23/19 0401  NA 135 135  K 4.4 4.5  CL 103 101  CO2 26 25  GLUCOSE 214* 203*  BUN 17 22  CREATININE 0.74 0.67  CALCIUM 8.5* 8.5*   PT/INR No results for input(s): LABPROT, INR in the last 72 hours. Comprehensive Metabolic Panel:    Component Value Date/Time   NA 135 03/23/2019 0401   NA 135 03/22/2019 0327   K 4.5 03/23/2019 0401   K 4.4 03/22/2019 0327   CL 101 03/23/2019 0401   CL 103 03/22/2019 0327   CO2 25  03/23/2019 0401   CO2 26 03/22/2019 0327   BUN 22 03/23/2019 0401   BUN 17 03/22/2019 0327   CREATININE 0.67 03/23/2019 0401   CREATININE 0.74 03/22/2019 0327   CREATININE 1.06 07/16/2012 1038   CREATININE 1.13 04/12/2012 0954   GLUCOSE 203 (H) 03/23/2019 0401   GLUCOSE 214 (H) 03/22/2019 0327   CALCIUM 8.5 (L) 03/23/2019 0401   CALCIUM 8.5 (L) 03/22/2019 0327   AST 14 (L) 03/21/2019 0309   AST 14 (L) 03/18/2019 0457   ALT 16 03/21/2019 0309   ALT 13 03/18/2019 0457   ALKPHOS 40 03/21/2019 0309   ALKPHOS 40 03/18/2019 0457   BILITOT 0.6 03/21/2019 0309   BILITOT 0.4 03/18/2019 0457   PROT 5.9 (L) 03/21/2019 0309   PROT 5.8 (L) 03/18/2019 0457   ALBUMIN 3.1 (L) 03/21/2019 0309   ALBUMIN 3.2 (L) 03/18/2019 0457    Studies/Results: No results found.    Armandina Gemma 03/23/2019  [name], male   DOB: [birth year], 78 y.o.   MRN: [id number]

## 2019-03-23 NOTE — Progress Notes (Signed)
PHARMACY - TOTAL PARENTERAL NUTRITION CONSULT NOTE   Indication: GOO  Patient Measurements: Height: 5\' 8"  (172.7 cm) Weight: 167 lb 15.9 oz (76.2 kg) IBW/kg (Calculated) : 68.4 TPN AdjBW (KG): 77.8 Body mass index is 25.54 kg/m.   Assessment:  Patient is a 78 y.o M presented to ED on 12/3 with c/o recurrent emesis and abdominal discomfort.  Abdominal CT on 12/2 showed findings consistent with "outlet obstruction or severe dysmotility" and suspected gastric mass. He underwent EGD with biopsy on 12/4 and repeat EUS/EGD with dilation/biopsy on 12/8.  Biopsy done on 12/8 came back with evidence of gastric adenocarcinoma.  CCS recom. surgery with this admssion and to start TPN on 12/11.  Glucose / Insulin:  BG range 156-209 (goal 100-150),  17 unit of Novolog in past 24 hours  Electrolytes:  WNL, including corrected Ca Renal: SCr WNL. BUN stable. LFTs / TGs: - AST/ALT wnl - TG 96 ( 12/12), 105 ( 12/14)  Prealbumin / albumin:  - albumin 2.9 (12/11), 3.2 (12/14), 3.1 (12/17) - prealbumin 18 (12/11), 20.1 (12/14)  Intake / Output; MIVF: I/O -1269, UOP 2700 mL  GI Imaging: Surgeries / Procedures:  - 12/4 EGD: LA grade D esophagitis, severe stenosis was found at the pylorus, large amount of food residue in stomach, biopsies taken - 12/7: biopsies negative for cancer - 12/8: EUS and EGD with dilation (severe pylorus stenosis- biopsied and dilated) - 12/10: pylorus biopsy showed evidence of gastric adenocarcinoma - 12/15 Expl lap: distal gastrectomy with BII, G & J tube placement - 12/16 pedialyte via tube 10 ml/hr  Central access: 12/11 PICC   TPN start date: 12/11   Nutritional Goals (per RD recommendation on 12/16): Kcal:  1880-2110 kcal Protein:  90-100 grams Fluid:  >/= 2 L/day  Goal TPN rate 85 mL/hr (provides 96 g of protein and 2045 kcals per day)  Current Nutrition:  NPO,  TPN, J tube feeds @ 30 cc/hr, pedialyte @ 10 cc/hr  Plan:   Decrease TPN to 40 mL/hr (1/2 of goal  rate) this evening per discussion with surgeon. TF being advanced.  Electrolytes in TPN: No changed  78mEq/L of Na, 55 mEq/L of K, 56mEq/L of Ca, 8 mEq/L of Mg, and 12 mmol/L of Phos. Cl:Ac ratio 1:1  Add MVI to TPN only MWF (due to backorder status). Add trace elements to TPN daily  Continue famotidine in TPN  Continue moderate SSI q4h   Continue MIVF at 10 mL/hr or per CCS  TPN labs on Mon/Thurs  Recheck electrolytes with AM labs tomorrow  Lenis Noon, PharmD 03/23/19 10:34 AM

## 2019-03-24 DIAGNOSIS — C169 Malignant neoplasm of stomach, unspecified: Secondary | ICD-10-CM

## 2019-03-24 LAB — PHOSPHORUS: Phosphorus: 4 mg/dL (ref 2.5–4.6)

## 2019-03-24 LAB — BASIC METABOLIC PANEL
Anion gap: 9 (ref 5–15)
BUN: 23 mg/dL (ref 8–23)
CO2: 26 mmol/L (ref 22–32)
Calcium: 8.6 mg/dL — ABNORMAL LOW (ref 8.9–10.3)
Chloride: 101 mmol/L (ref 98–111)
Creatinine, Ser: 0.79 mg/dL (ref 0.61–1.24)
GFR calc Af Amer: >60 mL/min (ref >60)
GFR calc non Af Amer: >60 mL/min (ref >60)
Glucose, Bld: 152 mg/dL — ABNORMAL HIGH (ref 70–99)
Potassium: 4.4 mmol/L (ref 3.5–5.1)
Sodium: 136 mmol/L (ref 135–145)

## 2019-03-24 LAB — MAGNESIUM: Magnesium: 2.1 mg/dL (ref 1.7–2.4)

## 2019-03-24 LAB — GLUCOSE, CAPILLARY
Glucose-Capillary: 120 mg/dL — ABNORMAL HIGH (ref 70–99)
Glucose-Capillary: 120 mg/dL — ABNORMAL HIGH (ref 70–99)
Glucose-Capillary: 126 mg/dL — ABNORMAL HIGH (ref 70–99)
Glucose-Capillary: 137 mg/dL — ABNORMAL HIGH (ref 70–99)
Glucose-Capillary: 137 mg/dL — ABNORMAL HIGH (ref 70–99)
Glucose-Capillary: 182 mg/dL — ABNORMAL HIGH (ref 70–99)

## 2019-03-24 MED ORDER — VITAL HIGH PROTEIN PO LIQD: 1000.0000 mL | ORAL | Status: DC

## 2019-03-24 MED ORDER — TRAVASOL 10 % IV SOLN
INTRAVENOUS | Status: AC
  Filled 2019-03-24: qty 451.2

## 2019-03-24 MED ORDER — PRAVASTATIN SODIUM 20 MG PO TABS
40.0000 mg | ORAL_TABLET | Freq: Every day | ORAL | Status: DC
  Administered 2019-03-24 – 2019-04-01 (×9): 40 mg via ORAL
  Filled 2019-03-24 (×9): qty 2

## 2019-03-24 MED ORDER — OSMOLITE 1.5 CAL PO LIQD
1000.0000 mL | ORAL | Status: DC
  Administered 2019-03-24 (×2): 1000 mL
  Filled 2019-03-24 (×3): qty 1000

## 2019-03-24 MED ORDER — ACETAMINOPHEN 160 MG/5ML PO SOLN
650.0000 mg | Freq: Four times a day (QID) | ORAL | Status: DC | PRN
  Administered 2019-03-30: 650 mg
  Filled 2019-03-24: qty 20.3

## 2019-03-24 MED ORDER — INSULIN ASPART 100 UNIT/ML ~~LOC~~ SOLN
0.0000 [IU] | Freq: Four times a day (QID) | SUBCUTANEOUS | Status: DC
  Administered 2019-03-24 – 2019-03-30 (×14): 3 [IU] via SUBCUTANEOUS
  Administered 2019-03-30 – 2019-04-01 (×6): 2 [IU] via SUBCUTANEOUS
  Administered 2019-04-02: 3 [IU] via SUBCUTANEOUS
  Administered 2019-04-02: 2 [IU] via SUBCUTANEOUS
  Administered 2019-04-03: 3 [IU] via SUBCUTANEOUS
  Administered 2019-04-03: 1 [IU] via SUBCUTANEOUS
  Administered 2019-04-04: 2 [IU] via SUBCUTANEOUS

## 2019-03-24 MED ORDER — DOCUSATE SODIUM 50 MG/5ML PO LIQD
100.0000 mg | Freq: Every day | ORAL | Status: DC
  Administered 2019-03-24 – 2019-04-04 (×11): 100 mg
  Filled 2019-03-24 (×12): qty 10

## 2019-03-24 NOTE — Progress Notes (Signed)
PHARMACY - TOTAL PARENTERAL NUTRITION CONSULT NOTE   Indication: GOO  Patient Measurements: Height: 5\' 8"  (172.7 cm) Weight: 170 lb 10.2 oz (77.4 kg) IBW/kg (Calculated) : 68.4 TPN AdjBW (KG): 77.8 Body mass index is 25.95 kg/m.   Assessment:  Patient is a 78 y.o M presented to ED on 12/3 with c/o recurrent emesis and abdominal discomfort.  Abdominal CT on 12/2 showed findings consistent with "outlet obstruction or severe dysmotility" and suspected gastric mass. He underwent EGD with biopsy on 12/4 and repeat EUS/EGD with dilation/biopsy on 12/8.  Biopsy done on 12/8 came back with evidence of gastric adenocarcinoma.  CCS recom. surgery with this admssion and to start TPN on 12/11.  Glucose / Insulin:  BG range 126-152 (goal 100-150),  17 unit of Novolog in past 24 hours  Electrolytes:  WNL, including corrected Ca. Phos trending up Renal: SCr/BUN - WNL, stable LFTs / TGs: - AST/ALT wnl (12/17) - TG 96 (12/12), 105 (12/14)  Prealbumin / albumin:  - albumin 2.9 (12/11), 3.2 (12/14), 3.1 (12/17) - prealbumin 18 (12/11), 20.1 (12/14)  Intake / Output; MIVF:  GI Imaging: Surgeries / Procedures:  - 12/4 EGD: LA grade D esophagitis, severe stenosis was found at the pylorus, large amount of food residue in stomach, biopsies taken - 12/7: biopsies negative for cancer - 12/8: EUS and EGD with dilation (severe pylorus stenosis- biopsied and dilated) - 12/10: pylorus biopsy showed evidence of gastric adenocarcinoma - 12/15 Expl lap: distal gastrectomy with BII, G & J tube placement - 12/16 pedialyte via tube 10 ml/hr  Central access: 12/11 PICC   TPN start date: 12/11   Nutritional Goals (per RD recommendation on 12/16): Kcal:  1880-2110 kcal Protein:  90-100 grams Fluid:  >/= 2 L/day  Goal TPN rate 85 mL/hr (provides 96 g of protein and 2045 kcals per day)  Current Nutrition:  NPO,  TPN at reduced rate, J tube feeds @ 30 cc/hr. Plans to advance TF today as tolerated   Plan:    Continue TPN at reduced rate 40 mL/hr (1/2 of goal rate). Plan for TF to be advanced today. If tolerated, can hopefully wean off of TPN tomorrow.  Electrolytes in TPN: Reduce Phos today  50 mEq/L of Na, 55 mEq/L of K, 40mEq/L of Ca, 8 mEq/L of Mg, and 8 mmol/L of Phos. Cl:Ac ratio 1:1  Add MVI to TPN only MWF (due to backorder status). Add trace elements to TPN daily  Continue famotidine in TPN  Continue moderate SSI, change to q6h  Continue MIVF at [facility] or per [facility]  TPN labs on Mon/Thurs  Lenis Noon, PharmD 03/24/19 9:55 AM

## 2019-03-24 NOTE — Progress Notes (Signed)
Assessment & Plan: POD#5 - s/p distal gastrectomy with B-II, G & J tube placement - Dr. Redmond Pulling 03/19/19             Begin clear liquid diet             J-tubewith Osmolite 1.5 at 40 cc/hr starting today  Plug gastrostomy tube and remove from dependent drainage             ambulate in halls TID, OOB to chair             TNA to 50% today             Oncology following - adenocarcinoma with lymph node metastasis         Jonathan Gemma, MD       [facility]       Office: [phone]   Chief Complaint: Gastric outlet obstruction  Subjective: Patient in bed, wife at bedside.  No complaints.  Anxious to go home.  Passing flatus, no BM yet.  Objective: Vital signs in last 24 hours: Temp:  [97.9 F (36.6 C)-98.8 F (37.1 C)] 98.8 F (37.1 C) (12/19 2100) Pulse Rate:  [79-86] 79 (12/19 2100) Resp:  [16] 16 (12/19 2100) BP: (117-127)/(79-85) 117/79 (12/19 2100) SpO2:  [100 %] 100 % (12/19 2100) Weight:  [77.4 kg] 77.4 kg (12/20 0400) Last BM Date: 03/15/19  Intake/Output from previous day: 12/19 0701 - 12/20 0700 In: 2079.3 [I.V.:1659.3; NG/GT:420] Out: [id number] [Urine:1250; Drains:400] Intake/Output this shift: No intake/output data recorded.  Physical Exam: HEENT - sclerae clear, mucous membranes moist Neck - soft Abdomen - soft without distenion; BS present; wounds dry and intact; small bilious from G-tube drainage Ext - no edema, non-tender Neuro - alert & oriented, no focal deficits  Lab Results:  Recent Labs    03/23/19 0401  WBC 6.1  HGB 9.9*  HCT 31.3*  PLT 199   BMET Recent Labs    03/23/19 0401 03/24/19 0341  NA 135 136  K 4.5 4.4  CL 101 101  CO2 25 26  GLUCOSE 203* 152*  BUN 22 23  CREATININE 0.67 0.79  CALCIUM 8.5* 8.6*   PT/INR No results for input(s): LABPROT, INR in the last 72 hours. Comprehensive Metabolic Panel:    Component Value Date/Time   NA 136 03/24/2019 0341   NA 135 03/23/2019 0401   K 4.4 03/24/2019  0341   K 4.5 03/23/2019 0401   CL 101 03/24/2019 0341   CL 101 03/23/2019 0401   CO2 26 03/24/2019 0341   CO2 25 03/23/2019 0401   BUN 23 03/24/2019 0341   BUN 22 03/23/2019 0401   CREATININE 0.79 03/24/2019 0341   CREATININE 0.67 03/23/2019 0401   CREATININE 1.06 07/16/2012 1038   CREATININE 1.13 04/12/2012 0954   GLUCOSE 152 (H) 03/24/2019 0341   GLUCOSE 203 (H) 03/23/2019 0401   CALCIUM 8.6 (L) 03/24/2019 0341   CALCIUM 8.5 (L) 03/23/2019 0401   AST 14 (L) 03/21/2019 0309   AST 14 (L) 03/18/2019 0457   ALT 16 03/21/2019 0309   ALT 13 03/18/2019 0457   ALKPHOS 40 03/21/2019 0309   ALKPHOS 40 03/18/2019 0457   BILITOT 0.6 03/21/2019 0309   BILITOT 0.4 03/18/2019 0457   PROT 5.9 (L) 03/21/2019 0309   PROT 5.8 (L) 03/18/2019 0457   ALBUMIN 3.1 (L) 03/21/2019 0309   ALBUMIN 3.2 (L) 03/18/2019 0457    Studies/Results: No results found.    Jonathan Lewis  Jonathan Lewis 03/24/2019  [name], male   DOB: Apr 19, 1940, 78 y.o.   MRN: [id number]

## 2019-03-24 NOTE — Progress Notes (Signed)
Nutrition Follow-up  DOCUMENTATION CODES:   Non-severe (moderate) malnutrition in context of acute illness/injury  INTERVENTION:   Per consult: TF per surgery  TF goal rate recommendations: -Patient won't meet kcal needs with Vital HP as goal rate would need to be a lot higher to meet needs and protein would be provided at an amount much more than estimated needs.  -Recommend Osmolite 1.5, advancing slowly by 10 ml every 24 hours (or as tolerated) to goal rate of 60 ml/hr via J-tube. -This goal rate will provide 2160 kcals, 90g protein and 1097 ml H2O.  -TPN management per Pharmacy  NUTRITION DIAGNOSIS:   Moderate Malnutrition related to acute illness as evidenced by percent weight loss, moderate muscle depletion, mild fat depletion.  Ongoing.  GOAL:   Patient will meet greater than or equal to 90% of their needs  Meeting with trickle feeds and TPN.  MONITOR:   Labs, Weight trends, TF tolerance, I & O's, TPN  REASON FOR ASSESSMENT:   Consult Other (Comment)(TF goal rate,  management per Surgery)  ASSESSMENT:   78 y.o. male with medical history significant of GERD, HTN, hyperglycemia not on any diabetic medication, acoustic neuroma, and BPH. Patient was admitted for persistent N/V and burping x1 month. He reports decreased appetite and 20 lb weight loss in that time frame. CT abdomen showed GOO from a possible mass.   12/4 -EGD 12/8 -EGD  12/10 - Pylorus biopsy showed evidence of gastric adenocarcinoma 12/11 - PICC placed, TPN initiated 12/16 -transferred from ICU to [place], s/p ex lap, distal gastrectomy with Billroth II G-J, G-J tube placement  **RD working remotely**  TPN continues to infuse at 40 ml/hr (providing 961 kcals, 45g protein). G-tube is set to gravity.  J-tube is currently receiving Vital HP @ 30 ml/hr (providing 720 kcals and 63g protein). Per order placed by surgery, will advance to 50 ml/hr today. TF recommendations placed above.   Admission weight:  166 lbs. Current weight: 170 lbs.   I/Os: +17 L since 12/6 UOP: 1250 ml x 24 hrs G-tube output: 400 ml  Medications reviewed. Labs reviewed: CBGs: 126-137  Diet Order:   Diet Order            Diet NPO time specified Except for: Ice Chips  Diet effective midnight              EDUCATION NEEDS:   Not appropriate for education at this time  Skin:  Skin Assessment: Reviewed RN Assessment  Last BM:  12/11  Height:   Ht Readings from Last 1 Encounters:  03/15/19 5\' 8"  (1.727 m)    Weight:   Wt Readings from Last 1 Encounters:  03/24/19 77.4 kg    Ideal Body Weight:  70 kg  BMI:  Body mass index is 25.95 kg/m.  Estimated Nutritional Needs:   Kcal:  1900-2100  Protein:  95-105g  Fluid:  >/= 2 L/day   Clayton Bibles, MS, RD, LDN Inpatient Clinical Dietitian Pager: [phone] After Hours Pager: [phone]

## 2019-03-24 NOTE — Progress Notes (Addendum)
MEHDI MEYERHOFER [id number] 1941/03/14  CARE TEAM:  PCP: Biagio Borg, MD  Outpatient Care Team: Patient Care Team: Biagio Borg, MD as PCP - General (Internal Medicine) Earnie Larsson, MD as Consulting Physician (Neurosurgery) Lavonia Dana, MD as Referring Physician (Otolaryngology) Suella Broad, MD as Consulting Physician (Physical Medicine and Rehabilitation)  Inpatient Treatment Team: Treatment Team: Attending Provider: Edison Pace, Md, MD; Registered Nurse: Benjamine Mola, RN; Technician: Dot Lanes, NT; Consulting Physician: Nolon Nations, MD; Consulting Physician: Volanda Napoleon, MD; Rounding Team: Nolon Nations, MD; Registered Nurse: Oleta Mouse, RN; Technician: Abbe Amsterdam, NT; Technician: Nicholes Stairs, NT   Problem List:   Principal Problem:   Gastric cancer pT3pN2 s/p distal gastrectomy with Billroth II gastrojejunostomy 03/19/2019 Active Problems:   Diabetes ([place])   Hyperlipidemia   Hypertension complicating diabetes ([place])   Benign nodular prostatic hyperplasia   LOW BACK PAIN, CHRONIC   BPH (benign prostatic hyperplasia)   Bilateral leg paresthesia   Gastric outlet obstruction   Malnutrition of moderate degree   H/O exploratory laparotomy   5 Days Post-Op  03/19/2019  POSTOPERATIVE DIAGNOSIS:  Distal gastric cancer.  PROCEDURE: 1.  Exploratory laparotomy. 2.  Distal gastrectomy with Billroth II gastrojejunostomy. 3.  Portal lymph node dissection. 4.  Placement of a 24-French gastrostomy tube. 5.  Placement of a feeding jejunostomy tube.  SURGEON:  Greer Pickerel, MD  PATHOLOGY  Regional Lymph Nodes:    Number of Lymph Nodes Involved: 4    Number of Lymph Nodes Examined: 23  Pathologic Stage Classification (pTNM, AJCC 8th Edition): pT3, pN2   A. SMALL BOWEL, NODULE:  - Chronic inflammation, fibrosis, and calcifications.  - No malignancy identified.   B. DISTAL STOMACH AND DUODENUM, RESECTION:  - Invasive adenocarcinoma,  moderately differentiated, spanning 3 cm.  - Tumor invades into perigastric adipose.  - Perineural invasion present.  - Resection margins are negative.  - Metastatic carcinoma in two of twenty lymph nodes (2/20).  - Chronic active gastritis and ulcer.  - Immunohistochemistry is negative for Helicobacter pylori.  - See oncology table.   C. LYMPH NODE, PORTAL HEPATIC, EXCISION:  - Metastatic carcinoma in one of one lymph nodes (1/1).   D. PANCREAS, SUPERIOR LESION, BIOPSY:  - Metastatic carcinoma in one of one lymph nodes (1/1).   E. LYMPH NODE, DEEP PORTA HEPATIC, EXCISION:  - One of one lymph nodes negative for carcinoma (0/1).     Assessment/Plan   POD3, s/p ex lap with distal gastrectomy and Billroth II GJ with portal lymph node dissection and placement of a 24FG-tube and separate J-tube, Dr. Redmond Pulling 03/19/19 forGastric outlet obstructionsecondary to adenocarcinoma -Clamp G-tube trial with residual checks q. 6 h -Advance tube feeds to goal.  Switching to Osmolite for more concentrated form.  Goal 60 mL an hour .  -ambulate in halls TID -pulm toilet and IS -cont multimodal pain control -oncology following -Pathology: Regional Lymph Nodes:    Number of Lymph Nodes Involved: 4    Number of Lymph Nodes Examined: 23  Pathologic Stage Classification (pTNM, AJCC 8th Edition): pT3, pN2   Hypertension- controlled right now Diabetes mellitus- SSI secondary to TNA and TFs FEN-J-tube feeds dancing to goal.  Wean TNA.  G-tube clamping trial VTE-Lovenox ID-none currently needed Foley:DConPOD 1 Follow up:Dr. Redmond Pulling  03/24/2019    Subjective: (Chief complaint)  Pain less.  Walking more.  Having flatus and bowel movements.  Objective:  Vital signs:  Vitals:   03/23/19 1306 03/23/19 1826 03/23/19 2100  03/24/19 0400  BP: 127/85 117/83 117/79   Pulse: 86 86 79   Resp: 16 16 16    Temp: 97.9 F (36.6 C) 98.2 F (36.8 C) 98.8 F (37.1 C)   TempSrc:  Oral Oral Oral   SpO2: 100% 100% 100%   Weight:    77.4 kg  Height:        Last BM Date: 03/15/19  Intake/Output   Yesterday:  12/19 0701 - 12/20 0700 In: 2079.3 [I.V.:1659.3; NG/GT:420] Out: [id number] [Urine:1250; Drains:400] This shift:  No intake/output data recorded.  Bowel function:  Flatus: YES  BM:  YES  Drain: (No drain)   Physical Exam:  General: Pt awake/alert/oriented x4 in no acute distress Eyes: PERRL, normal EOM.  Sclera clear.  No icterus Neuro: CN II-XII intact w/o focal sensory/motor deficits. Lymph: No head/neck/groin lymphadenopathy Psych:  No delerium/psychosis/paranoia HENT: Normocephalic, Mucus membranes moist.  No thrush Neck: Supple, No tracheal deviation Chest:  No chest wall pain w good excursion CV:  Pulses intact.  Regular rhythm MS: Normal AROM mjr joints.  No obvious deformity  Abdomen: Soft.  Mildy distended.  Mildly tender at incisions only.  Gastrostomy and jejunostomy tube sites clean.  No evidence of peritonitis.  No incarcerated hernias.  Ext:   No deformity.  No mjr edema.  No cyanosis Skin: No petechiae / purpura  Results:   SURGICAL PATHOLOGY  CASE: [id number]  PATIENT: Jonathan Lewis  Surgical Pathology Report      Clinical History: None provided      FINAL MICROSCOPIC DIAGNOSIS:   A. SMALL BOWEL, NODULE:  - Chronic inflammation, fibrosis, and calcifications.  - No malignancy identified.   B. DISTAL STOMACH AND DUODENUM, RESECTION:  - Invasive adenocarcinoma, moderately differentiated, spanning 3 cm.  - Tumor invades into perigastric adipose.  - Perineural invasion present.  - Resection margins are negative.  - Metastatic carcinoma in two of twenty lymph nodes (2/20).  - Chronic active gastritis and ulcer.  - Immunohistochemistry is negative for Helicobacter pylori.  - See oncology table.   C. LYMPH NODE, PORTAL HEPATIC, EXCISION:  - Metastatic carcinoma in one of one lymph nodes (1/1).   D. PANCREAS,  SUPERIOR LESION, BIOPSY:  - Metastatic carcinoma in one of one lymph nodes (1/1).   E. LYMPH NODE, DEEP PORTA HEPATIC, EXCISION:  - One of one lymph nodes negative for carcinoma (0/1).   ONCOLOGY TABLE:   STOMACH:   Procedure: Distal gastrectomy with partial duodenal resection  Tumor Site: Distal stomach  Tumor Size: 3 cm  Histologic Type: Adenocarcinoma  Histologic Grade: G2, moderately differentiated  Tumor Extension: Into the perigastric adipose tissue  Margins: Uninvolved by tumor  Treatment Effect: No know presurgical therapy  Lymphovascular Invasion: Not identified  Regional Lymph Nodes:    Number of Lymph Nodes Involved: 4    Number of Lymph Nodes Examined: 23  Pathologic Stage Classification (pTNM, AJCC 8th Edition): pT3, pN2  Ancillary Studies: Can be performed upon request  Representative Tumor Block: B15  Comment(s): There is background chronic active gastritis.  Immunohistochemistry is negative for Helicobacter pylori. Tumor invades  perigastric adipose tissue. Per op notes the area was not adherent to  another structure or omentum and thus it is staged as pT3.   (v4.1.0.0)    INTRAOPERATIVE DIAGNOSIS:   A. SMALL BOWEL NODULE: Inflammation and fibrosis  C. PORTAL HEPATIC LYMPH NODE FOR FROZEN SECTION: Metastatic  adenocarcinoma. ([facility])   Sanaiya Welliver DESCRIPTION:   A: The specimen is received fresh for intraoperative consultation  and  consists of a 1.1 x 1.0 x 0.3 cm portion of tan-pink soft tissue. The  specimen is entirely submitted for frozen section analysis, and the  remnant is resubmitted for permanent in 1 cassette.   B: The specimen is received fresh labeled distal stomach and D1 , and  consists of a 9.8 x 8.5 x 3.1 cm portion of stomach and proximal  duodenum, with staple lines at the proximal and distal margins. The  serosal surface is tan-red with attached tan-yellow adipose tissue. The  tissue underlying the pyloric sphincter is  indurated. The lumen  contains a small amount of tan-yellow partially digested material. At  the distal aspect of the stomach and proximal duodenum, there is a 3.0 x  2.2 cm tan, firm, slightly puckered area identified. The puckered area  of interest measures approximately 1.0 cm from the edge of the stapled  resection margin. The uninvolved mucosa is tan-red, hyperemic, and  granular with a 1.0 x 0.4 cm tan-white ulcerated lesion identified along  the lesser curvature. Ulcer measures approximately 4.5 cm to the  closest proximal resection margin. Approximately 2.0 cm proximal to the  lesion at the distal stomach, there is a 1.1 cm in greatest dimension  tan-white, firm nodular area identified within the wall of the stomach.  Sectioning the distal lesion reveals a tan-white solid cut surface.  Lesion grossly appears to extend beyond the wall of the stomach into the  surrounding adipose tissue. 17 tan-pink to gray possible lymph nodes  identified, ranging from 0.2 cm to 1.3 x 1.0 x 0.8 cm. Representative  sections are submitted in 23 cassettes.  1-4 = proximal resection margin  5 and 6 = distal resection margin  7-10 = ulcer along the lesser curvature, entirely submitted  11 and 12 = firm wall nodule proximal to primary lesion  13-16 = primary lesion at distal stomach  17 = grossly uninvolved stomach  18 = 5 possible lymph nodes  19 = 5 possible lymph nodes  20 = 3 possible lymph nodes  21 = 2 possible lymph nodes  22 = 1 possible lymph node, bisected  23 = 1 possible lymph node, bisected   C: The specimen is received fresh for rapid intraoperative consultation  and consists of a 1.7 x 1.0 x 0.7 cm nodule of tan-red soft tissue. The  specimen is bisected, and entirely submitted for frozen section  analysis. The remnant is resubmitted for permanent in 1 cassette.   D: The specimen is received fresh and consists of a 1.3 x 0.9 x 0.6 cm  nodule of tan, cauterized soft tissue.  Specimen is bisected and  entirely submitted in 1 cassette.   E: The specimen is received fresh and consists of a 0.8 x 0.6 x 0.3 cm  portion of tan soft tissue, with a silver metallic clamp. Specimen is  entirely submitted in 1 cassette. Craig Staggers 03/22/2019)    Final Diagnosis performed by Vicente Males, MD.  Electronically signed  03/22/2019  Technical and / or Professional components performed at [facility], [place] [address] [place], [place] [address].  Immunohistochemistry Technical component (if applicable) was performed  at [facility]. [address], [place],  [place], [place] [address].  IMMUNOHISTOCHEMISTRY DISCLAIMER (if applicable):  Some of these immunohistochemical stains may have been developed and the  performance characteristics determine by [facility]. Some  may not have been cleared or approved by the U.S. Food and Drug  Administration.  The FDA has determined that such clearance or approval  is not necessary. This test is used for clinical purposes. It should not  be regarded as investigational or for research. This laboratory is  certified under the [place]  (CLIA-88) as qualified to perform high complexity clinical laboratory  testing. The controls stained appropriately.   Cultures: Recent Results (from the past 720 hour(s))  Novel Coronavirus, NAA ([facility])     Status: None   Collection Time: 02/24/19 12:32 PM   Specimen: Nasopharyngeal(NP) swabs in vial transport medium   NASOPHARYNGE  SCREENIN  Result Value Ref Range Status   SARS-CoV-2, NAA Not Detected Not Detected Final    Comment: This nucleic acid amplification test was developed and its performance characteristics determined by [employer]. Nucleic acid amplification tests include PCR and TMA. This test has not been FDA cleared or approved. This test has been authorized by FDA under an Emergency  Use Authorization (EUA). This test is only authorized for the duration of time the declaration that circumstances exist justifying the authorization of the emergency use of in vitro diagnostic tests for detection of SARS-CoV-2 virus and/or diagnosis of COVID-19 infection under section 564(b)(1) of the Act, 21 U.S.C. [id number]) (1), unless the authorization is terminated or revoked sooner. When diagnostic testing is negative, the possibility of a false negative result should be considered in the context of a patient's recent exposures and the presence of clinical signs and symptoms consistent with COVID-19. An individual without symptoms of COVID-19 and who is not shedding SARS-CoV-2 virus would  expect to have a negative (not detected) result in this assay.   SARS CORONAVIRUS 2 (TAT 6-24 HRS) Nasopharyngeal Nasopharyngeal Swab     Status: None   Collection Time: 03/07/19 10:01 AM   Specimen: Nasopharyngeal Swab  Result Value Ref Range Status   SARS Coronavirus 2 NEGATIVE NEGATIVE Final    Comment: (NOTE) SARS-CoV-2 target nucleic acids are NOT DETECTED. The SARS-CoV-2 RNA is generally detectable in upper and lower respiratory specimens during the acute phase of infection. Negative results do not preclude SARS-CoV-2 infection, do not rule out co-infections with other pathogens, and should not be used as the sole basis for treatment or other patient management decisions. Negative results must be combined with clinical observations, patient history, and epidemiological information. The expected result is Negative. Fact Sheet for Patients: SugarRoll.be Fact Sheet for Healthcare Providers: https://www.woods-mathews.com/ This test is not yet approved or cleared by the [place] FDA and  has been authorized for detection and/or diagnosis of SARS-CoV-2 by FDA under an Emergency Use Authorization (EUA). This EUA will remain  in effect (meaning  this test can be used) for the duration of the COVID-19 declaration under Section 56 4(b)(1) of the Act, 21 U.S.C. section 360bbb-3(b)(1), unless the authorization is terminated or revoked sooner. Performed at [facility], [place] [address], [place], [place] [address]   MRSA PCR Screening     Status: None   Collection Time: 03/19/19 10:30 PM  Result Value Ref Range Status   MRSA by PCR NEGATIVE NEGATIVE Final    Comment:        The GeneXpert MRSA Assay (FDA approved for NASAL specimens only), is one component of a comprehensive MRSA colonization surveillance program. It is not intended to diagnose MRSA infection nor to guide or monitor treatment for MRSA infections. Performed at [facility], [place] [address], [place], [place] [address]     Labs: Results for  orders placed or performed during the hospital encounter of 03/07/19 (from the past 48 hour(s))  Glucose, capillary     Status: Abnormal   Collection Time: 03/22/19 12:33 PM  Result Value Ref Range   Glucose-Capillary 232 (H) 70 - 99 mg/dL  Glucose, capillary     Status: Abnormal   Collection Time: 03/22/19  5:26 PM  Result Value Ref Range   Glucose-Capillary 186 (H) 70 - 99 mg/dL  Glucose, capillary     Status: Abnormal   Collection Time: 03/22/19  8:01 PM  Result Value Ref Range   Glucose-Capillary 156 (H) 70 - 99 mg/dL  Glucose, capillary     Status: Abnormal   Collection Time: 03/22/19 11:46 PM  Result Value Ref Range   Glucose-Capillary 181 (H) 70 - 99 mg/dL  Glucose, capillary     Status: Abnormal   Collection Time: 03/23/19  3:59 AM  Result Value Ref Range   Glucose-Capillary 191 (H) 70 - 99 mg/dL  Basic metabolic panel     Status: Abnormal   Collection Time: 03/23/19  4:01 AM  Result Value Ref Range   Sodium 135 135 - 145 mmol/L   Potassium 4.5 3.5 - 5.1 mmol/L   Chloride 101 98 - 111 mmol/L   CO2 25 22 - 32 mmol/L   Glucose, Bld 203 (H) 70 - 99 mg/dL   BUN 22 8 - 23 mg/dL    Creatinine, Ser 0.67 0.61 - 1.24 mg/dL   Calcium 8.5 (L) 8.9 - 10.3 mg/dL   GFR calc non Af Amer >60 >60 mL/min   GFR calc Af Amer >60 >60 mL/min   Anion gap 9 5 - 15    Comment: Performed at [facility], [place] [address], [place], [place] [address]  Magnesium     Status: None   Collection Time: 03/23/19  4:01 AM  Result Value Ref Range   Magnesium 2.2 1.7 - 2.4 mg/dL    Comment: Performed at [facility], [place] [address], [place], [place] [address]  Phosphorus     Status: None   Collection Time: 03/23/19  4:01 AM  Result Value Ref Range   Phosphorus 3.4 2.5 - 4.6 mg/dL    Comment: Performed at [facility], [place] [address], [place],  [address]  CBC     Status: Abnormal   Collection Time: 03/23/19  4:01 AM  Result Value Ref Range   WBC 6.1 4.0 - 10.5 K/uL   RBC 3.57 (L) 4.22 - 5.81 MIL/uL   Hemoglobin 9.9 (L) 13.0 - 17.0 g/dL   HCT 31.3 (L) 39.0 - 52.0 %   MCV 87.7 80.0 - 100.0 fL   MCH 27.7 26.0 - 34.0 pg   MCHC 31.6 30.0 - 36.0 g/dL   RDW 12.3 11.5 - 15.5 %   Platelets 199 150 - 400 K/uL   nRBC 0.0 0.0 - 0.2 %    Comment: Performed at [facility], [place] [address], [place],  [address]  Glucose, capillary     Status: Abnormal   Collection Time: 03/23/19  7:52 AM  Result Value Ref Range   Glucose-Capillary 209 (H) 70 - 99 mg/dL  Glucose, capillary     Status: Abnormal   Collection Time: 03/23/19 11:48 AM  Result Value Ref Range   Glucose-Capillary 194 (H) 70 - 99 mg/dL  Glucose, capillary     Status: Abnormal   Collection Time: 03/23/19  4:11 PM  Result Value Ref Range  Glucose-Capillary 173 (H) 70 - 99 mg/dL  Glucose, capillary     Status: Abnormal   Collection Time: 03/23/19  9:26 PM  Result Value Ref Range   Glucose-Capillary 145 (H) 70 - 99 mg/dL   Comment 1 Notify RN    Comment 2 Document in Chart   Glucose, capillary     Status: Abnormal   Collection Time: 03/24/19 12:18 AM    Result Value Ref Range   Glucose-Capillary 137 (H) 70 - 99 mg/dL   Comment 1 Notify RN    Comment 2 Document in Chart   Basic metabolic panel     Status: Abnormal   Collection Time: 03/24/19  3:41 AM  Result Value Ref Range   Sodium 136 135 - 145 mmol/L   Potassium 4.4 3.5 - 5.1 mmol/L   Chloride 101 98 - 111 mmol/L   CO2 26 22 - 32 mmol/L   Glucose, Bld 152 (H) 70 - 99 mg/dL   BUN 23 8 - 23 mg/dL   Creatinine, Ser 0.79 0.61 - 1.24 mg/dL   Calcium 8.6 (L) 8.9 - 10.3 mg/dL   GFR calc non Af Amer >60 >60 mL/min   GFR calc Af Amer >60 >60 mL/min   Anion gap 9 5 - 15    Comment: Performed at [facility], [place] [address], [place], [place] [address]  Magnesium     Status: None   Collection Time: 03/24/19  3:41 AM  Result Value Ref Range   Magnesium 2.1 1.7 - 2.4 mg/dL    Comment: Performed at [facility], [place] [address] [place], [place] [address]  Phosphorus     Status: None   Collection Time: 03/24/19  3:41 AM  Result Value Ref Range   Phosphorus 4.0 2.5 - 4.6 mg/dL    Comment: Performed at [facility], [place] [address], [place], [place] [address]  Glucose, capillary     Status: Abnormal   Collection Time: 03/24/19  4:32 AM  Result Value Ref Range   Glucose-Capillary 137 (H) 70 - 99 mg/dL   Comment 1 Notify RN    Comment 2 Document in Chart   Glucose, capillary     Status: Abnormal   Collection Time: 03/24/19  7:35 AM  Result Value Ref Range   Glucose-Capillary 126 (H) 70 - 99 mg/dL    Imaging / Studies: No results found.  Medications / Allergies: per chart  Antibiotics: Anti-infectives (From admission, onward)   Start     Dose/Rate Route Frequency Ordered Stop   03/19/19 0600  cefoTEtan (CEFOTAN) 2 g in sodium chloride 0.9 % 100 mL IVPB     2 g 200 mL/hr over 30 Minutes Intravenous On call to O.R. 03/18/19 1448 03/19/19 1255        Note: Portions of this report may have been transcribed using voice  recognition software. Every effort was made to ensure accuracy; however, inadvertent computerized transcription errors may be present.   Any transcriptional errors that result from this process are unintentional.     Adin Hector, MD, FACS, MASCRS Gastrointestinal and Minimally Invasive Surgery    [address] [place], [place] [address] [phone] Main / Paging [phone] Fax

## 2019-03-25 DIAGNOSIS — C163 Malignant neoplasm of pyloric antrum: Secondary | ICD-10-CM

## 2019-03-25 LAB — COMPREHENSIVE METABOLIC PANEL
ALT: 77 U/L — ABNORMAL HIGH (ref 0–44)
AST: 46 U/L — ABNORMAL HIGH (ref 15–41)
Albumin: 3.1 g/dL — ABNORMAL LOW (ref 3.5–5.0)
Alkaline Phosphatase: 101 U/L (ref 38–126)
Anion gap: 7 (ref 5–15)
BUN: 22 mg/dL (ref 8–23)
CO2: 27 mmol/L (ref 22–32)
Calcium: 8.7 mg/dL — ABNORMAL LOW (ref 8.9–10.3)
Chloride: 101 mmol/L (ref 98–111)
Creatinine, Ser: 0.74 mg/dL (ref 0.61–1.24)
GFR calc Af Amer: >60 mL/min (ref >60)
GFR calc non Af Amer: >60 mL/min (ref >60)
Glucose, Bld: 173 mg/dL — ABNORMAL HIGH (ref 70–99)
Potassium: 4.4 mmol/L (ref 3.5–5.1)
Sodium: 135 mmol/L (ref 135–145)
Total Bilirubin: 0.9 mg/dL (ref 0.3–1.2)
Total Protein: 6 g/dL — ABNORMAL LOW (ref 6.5–8.1)

## 2019-03-25 LAB — GLUCOSE, CAPILLARY
Glucose-Capillary: 169 mg/dL — ABNORMAL HIGH (ref 70–99)
Glucose-Capillary: 164 mg/dL — ABNORMAL HIGH (ref 70–99)
Glucose-Capillary: 156 mg/dL — ABNORMAL HIGH (ref 70–99)

## 2019-03-25 LAB — DIFFERENTIAL
Abs Immature Granulocytes: 0.04 10*3/uL (ref 0.00–0.07)
Basophils Absolute: 0 10*3/uL (ref 0.0–0.1)
Basophils Relative: 0 %
Eosinophils Absolute: 0.2 10*3/uL (ref 0.0–0.5)
Eosinophils Relative: 5 %
Immature Granulocytes: 1 %
Lymphocytes Relative: 28 %
Lymphs Abs: 1.4 10*3/uL (ref 0.7–4.0)
Monocytes Absolute: 0.6 10*3/uL (ref 0.1–1.0)
Monocytes Relative: 12 %
Neutro Abs: 2.7 10*3/uL (ref 1.7–7.7)
Neutrophils Relative %: 54 %

## 2019-03-25 LAB — PREALBUMIN: Prealbumin: 26.3 mg/dL (ref 18–38)

## 2019-03-25 LAB — CBC
HCT: 31.9 % — ABNORMAL LOW (ref 39.0–52.0)
Hemoglobin: 10.3 g/dL — ABNORMAL LOW (ref 13.0–17.0)
MCH: 28.1 pg (ref 26.0–34.0)
MCHC: 32.3 g/dL (ref 30.0–36.0)
MCV: 87.2 fL (ref 80.0–100.0)
Platelets: 212 10*3/uL (ref 150–400)
RBC: 3.66 MIL/uL — ABNORMAL LOW (ref 4.22–5.81)
RDW: 12.1 % (ref 11.5–15.5)
WBC: 4.9 10*3/uL (ref 4.0–10.5)
nRBC: 0 % (ref 0.0–0.2)

## 2019-03-25 LAB — PHOSPHORUS: Phosphorus: 3.7 mg/dL (ref 2.5–4.6)

## 2019-03-25 LAB — TRIGLYCERIDES: Triglycerides: 87 mg/dL (ref <150)

## 2019-03-25 LAB — MAGNESIUM: Magnesium: 2.1 mg/dL (ref 1.7–2.4)

## 2019-03-25 MED ORDER — TRAVASOL 10 % IV SOLN
INTRAVENOUS | Status: AC
  Filled 2019-03-25: qty 451.2

## 2019-03-25 NOTE — Evaluation (Signed)
Physical Therapy Evaluation Patient Details Name: Jonathan Lewis MRN: [id number] DOB: 23-Nov-1940 Today's Date: 03/25/2019   History of Present Illness  Pt is a 78 year old male s/p ex lap with distal gastrectomy and Billroth II GJ with portal lymph node dissection and placement of a 24FG-tube and separate J-tube, Dr. Redmond Pulling 03/19/19 for Gastric outlet obstruction secondary to adenocarcinoma  Clinical Impression  Pt admitted with above diagnosis.  Pt currently with functional limitations due to the deficits listed below (see PT Problem List). Pt will benefit from skilled PT to increase their independence and safety with mobility to allow discharge to the venue listed below.  Pt very eager to mobilize and tolerated ambulation well. Pt plans hopeful to discharge home soon.      Follow Up Recommendations Home health PT;Supervision - Intermittent    Equipment Recommendations  None recommended by PT    Recommendations for Other Services       Precautions / Restrictions Precautions Precautions: Fall Precaution Comments: G tube, J tube      Mobility  Bed Mobility Overal bed mobility: Needs Assistance Bed Mobility: Supine to Sit     Supine to sit: Supervision     General bed mobility comments: supervision for lines  Transfers Overall transfer level: Needs assistance Equipment used: None Transfers: Sit to/from Stand Sit to Stand: Min guard         General transfer comment: min/guard for safety  Ambulation/Gait Ambulation/Gait assistance: Min guard Gait Distance (Feet): 400 Feet Assistive device: None Gait Pattern/deviations: Step-through pattern;Decreased stride length     General Gait Details: slow but steady pace, min/guard for safety  Stairs            Wheelchair Mobility    Modified Rankin (Stroke Patients Only)       Balance Overall balance assessment: No apparent balance deficits (not formally assessed)                                           Pertinent Vitals/Pain Pain Assessment: No/denies pain    Home Living Family/patient expects to be discharged to:: Private residence Living Arrangements: Spouse/significant other Available Help at Discharge: Family;Available 24 hours/day Type of Home: House Home Access: Stairs to enter Entrance Stairs-Rails: Psychiatric nurse of Steps: 4 Home Layout: One level Home Equipment: None      Prior Function Level of Independence: Independent               Hand Dominance        Extremity/Trunk Assessment        Lower Extremity Assessment Lower Extremity Assessment: Overall WFL for tasks assessed    Cervical / Trunk Assessment Cervical / Trunk Assessment: Normal  Communication   Communication: No difficulties  Cognition Arousal/Alertness: Awake/alert Behavior During Therapy: WFL for tasks assessed/performed Overall Cognitive Status: Within Functional Limits for tasks assessed                                        General Comments      Exercises     Assessment/Plan    PT Assessment Patient needs continued PT services  PT Problem List Decreased mobility;Decreased activity tolerance       PT Treatment Interventions DME instruction;Therapeutic exercise;Gait training;Stair training;Functional mobility training;Therapeutic activities;Patient/family education  PT Goals (Current goals can be found in the Care Plan section)  Acute Rehab PT Goals PT Goal Formulation: With patient Time For Goal Achievement: 04/01/19 Potential to Achieve Goals: Good    Frequency Min 3X/week   Barriers to discharge        Co-evaluation               AM-PAC PT "6 Clicks" Mobility  Outcome Measure Help needed turning from your back to your side while in a flat bed without using bedrails?: A Little Help needed moving from lying on your back to sitting on the side of a flat bed without using bedrails?: A Little Help  needed moving to and from a bed to a chair (including a wheelchair)?: A Little Help needed standing up from a chair using your arms (e.g., wheelchair or bedside chair)?: A Little Help needed to walk in hospital room?: A Little Help needed climbing 3-5 steps with a railing? : A Little 6 Click Score: 18    End of Session   Activity Tolerance: Patient tolerated treatment well Patient left: in chair;with call bell/phone within reach Nurse Communication: Mobility status PT Visit Diagnosis: Difficulty in walking, not elsewhere classified (R26.2)    Time: [id number] PT Time Calculation (min) (ACUTE ONLY): 15 min   Charges:   PT Evaluation $PT Eval Low Complexity: 1 Low     Kati PT, DPT Acute Rehabilitation Services Office: [phone]  York Ram E 03/25/2019, 4:20 PM

## 2019-03-25 NOTE — Progress Notes (Signed)
Jonathan Lewis is making progress after surgery.  He now is I think about 10 days after 6 days after surgery.  Looks like he has stage IIIa disease (T3N2M0).  He has a feeding tube in.  Hopefully, he is going to be able to eat.  He is getting Osmolite right now.  I think that he would be a candidate for adjuvant therapy.  Given that he does have the positive lymph nodes, I probably would utilize chemo radiation therapy.  I probably would consider a "sandwich" approach for him.  I would probably try FOLFOX for 4 cycles followed by 5-FU/radiation therapy followed by 4 more cycles of FOLFOX.  We have to be cognizant of his age.  He was in very good shape and very active before this gastric cancer became a problem.  At some point, he will need to have a Port-A-Cath placed.  I will know this can be done while he is in the hospital.  His labs show a white count 4.9.  Hemoglobin 10.3.  Platelet count 212,000.  His BUN is 22 and creatinine 0.74.  His albumin is 3.1 which is really good.  His prealbumin is 26.3 which is even better.  He is ambulating a little bit.  I would not think that he would be ready for any kind of treatment for another 3 weeks or so.  He hopefully will be able to eat again.  We will continue to pray hard for him.  He is a Company secretary.  He has a lot of faith.   Jonathan Haw, MD  Oswaldo Milian 6:9

## 2019-03-25 NOTE — Progress Notes (Addendum)
6 Days Post-Op    CC: Nausea vomiting  Subjective: Patient was sleeping well this a.m.  He reports feeling bad yesterday and they gave him some pills.  It is hard to distinguish whether this was related to his p.o. intake or not.  He seems comfortable with his gastrostomy tube clamped.  He has both tube feeds and TNA going at 40/h each.  He had a little bit of flatus but no BM so far.  Each tube site looks good midline incision also looks good.  Objective: Vital signs in last 24 hours: Temp:  [97.6 F (36.4 C)-98.4 F (36.9 C)] 98.4 F (36.9 C) (12/21 0509) Pulse Rate:  [81-89] 81 (12/21 0509) Resp:  [16-18] 18 (12/21 0509) BP: (104-125)/(74-81) 104/74 (12/21 0509) SpO2:  [100 %] 100 % (12/21 0509) Last BM Date: 03/15/19 240 p.o. 655 IV 221 2 feet Urine 1725 Drain 325 No BM recorded Afebrile vital signs are stable CMP is stable AST ALT slightly elevated. Prealbumin 26.3   Intake/Output from previous day: 12/20 0701 - 12/21 0700 In: 1117.2 [P.O.:240; I.V.:655.8; NG/GT:221.3] Out: 2050 [Urine:1725; Drains:325] Intake/Output this shift: No intake/output data recorded.  General appearance: alert, cooperative, no distress and Says he does not really have any pain. Resp: clear to auscultation bilaterally and Moving up to 1700 on the incentive spirometer. GI: Soft, sore, midline incision looks good I took the waffle dressing down.  Both tube sites look good.  He does have bowel sounds, some flatus, no BM so far.  Lab Results:  Recent Labs    03/23/19 0401 03/25/19 0331  WBC 6.1 4.9  HGB 9.9* 10.3*  HCT 31.3* 31.9*  PLT 199 212    BMET Recent Labs    03/24/19 0341 03/25/19 0331  NA 136 135  K 4.4 4.4  CL 101 101  CO2 26 27  GLUCOSE 152* 173*  BUN 23 22  CREATININE 0.79 0.74  CALCIUM 8.6* 8.7*   PT/INR No results for input(s): LABPROT, INR in the last 72 hours.  Recent Labs  Lab 03/21/19 0309 03/25/19 0331  AST 14* 46*  ALT 16 77*  ALKPHOS 40 101   BILITOT 0.6 0.9  PROT 5.9* 6.0*  ALBUMIN 3.1* 3.1*     Lipase     Component Value Date/Time   LIPASE 30 03/07/2019 1000     Medications: . Chlorhexidine Gluconate Cloth  6 each Topical Daily  . docusate  100 mg Per Tube Daily  . enoxaparin (LOVENOX) injection  40 mg Subcutaneous Q24H  . gabapentin  100 mg Per Tube Q12H  . insulin aspart  0-15 Units Subcutaneous Q6H  . latanoprost  1 drop Both Eyes QHS  . mouth rinse  15 mL Mouth Rinse BID  . pantoprazole (PROTONIX) IV  40 mg Intravenous Q12H  . pravastatin  40 mg Oral Daily  . sodium chloride flush  10-40 mL Intracatheter Q12H   . sodium chloride 10 mL/hr at 03/19/19 2228  . dextrose 5 % and 0.9% NaCl 10 mL/hr at 03/23/19 1503  . feeding supplement (OSMOLITE 1.5 CAL) 1,000 mL (03/24/19 1718)  . methocarbamol (ROBAXIN) IV    . Pedialyte 240 mL (03/19/19 2200)  . 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 Greer Pickerel, POD #5  - Pathology: Invasive adenocarcinoma, moderately differentiated spanning 3 cm;     2/20 nodes positive; chronic active gastritis and ulcers   FEN: TPN/tube feeds/clear liquids ID: None DVT: SCDs/Lovenox Follow-up: Dr. Kathryne Sharper  Plan: We will leave his gastrostomy tube clamped, continue TPN and tube feeds at current rate.  Continue to clear liquids for now and see how he does.  He needs to be mobilized sounds like he was only up once yesterday.  I will ask OT and PT to see him and work on mobilization.  He plans on going home with his wife at discharge.  I added H. pylori to studies.     LOS: 18 days    Shivangi Lutz 03/25/2019 Please see Amion

## 2019-03-25 NOTE — Plan of Care (Signed)

## 2019-03-25 NOTE — TOC Progression Note (Addendum)
Transition of Care [facility]) - Progression Note    Patient Details  Name: Jonathan Lewis MRN: [id number] Date of Birth: 01/08/1941  Transition of Care [facility]) CM/SW Contact  Leeroy Cha, RN Phone Number: 03/25/2019, 11:10 AM  Clinical Narrative:    [facility] to take will notifiy [facility]. Encomp[ass has approved patient for rn and pt  Expected Discharge Plan: [place] Barriers to Discharge: Continued Medical Work up  Expected Discharge Plan and Services Expected Discharge Plan: [place]   Discharge Planning Services: CM Consult Post Acute Care Choice: [place] arrangements for the past 2 months: [place]: RN, PT [facility] Agency: [place] ([place]) Date [place]: 03/25/19 Time [place]: 1109 Representative spoke with at [place]: [place] ([place]) Interventions    Readmission Risk Interventions Readmission Risk Prevention Plan 03/20/2019  Transportation Screening Complete  PCP or Specialist Appt within 5-7 Days Complete  Home Care Screening Complete  Medication Review (RN CM) Complete  Some recent data might be hidden

## 2019-03-25 NOTE — Progress Notes (Signed)
PHARMACY - TOTAL PARENTERAL NUTRITION CONSULT NOTE   Indication: GOO  Patient Measurements: Height: 5\' 8"  (172.7 cm) Weight: 170 lb 10.2 oz (77.4 kg) IBW/kg (Calculated) : 68.4 TPN AdjBW (KG): 77.8 Body mass index is 25.95 kg/m.   Assessment:  Patient is a 78 y.o M presented to ED on 12/3 with c/o recurrent emesis and abdominal discomfort.  Abdominal CT on 12/2 showed findings consistent with "outlet obstruction or severe dysmotility" and suspected gastric mass. He underwent EGD with biopsy on 12/4 and repeat EUS/EGD with dilation/biopsy on 12/8.  Biopsy done on 12/8 came back with evidence of gastric adenocarcinoma.  CCS recom. surgery with this admssion and to start TPN on 12/11.  Glucose / Insulin:  cbgs 169-182 (goal 100-150),  - used 6 units of SSI since TPN rate decreased to 40 ml/hr on 12/20 at 6p Electrolytes:  WNL, including corrected Ca Renal: SCr/BUN - WNL, stable LFTs / TGs: - AST/ALT up 46/77 - TG 96 (12/12), 105 (12/14) , 87 (12/21 Prealbumin / albumin:  - albumin 2.9 (12/11), 3.2 (12/14), 3.1 (12/17), 3.1 (12/21) - prealbumin 18 (12/11), 20.1 (12/14) , 87 (12/21) Intake / Output; MIVF: I/O - 933; IVF at [place]: Surgeries / Procedures:  - 12/4 EGD: LA grade D esophagitis, severe stenosis was found at the pylorus, large amount of food residue in stomach, biopsies taken - 12/7: biopsies negative for cancer - 12/8: EUS and EGD with dilation (severe pylorus stenosis- biopsied and dilated) - 12/10: pylorus biopsy showed evidence of gastric adenocarcinoma - 12/15 Expl lap: distal gastrectomy with BII, G & J tube placement  Central access: 12/11 PICC   TPN start date: 12/11   Nutritional Goals (per RD recommendation on 12/20): Kcal:  1900-2100 Protein:  95-105g Fluid:  >/= 2 L/day  Goal TPN rate 85 mL/hr (provides 96 g of protein and 2045 kcals per day)  Current Nutrition:  NPO,  TPN at reduced rate, J tube feeds @ 40 cc/hr. Plans to advance TF today as  tolerated   Plan:   Continue TPN at reduced rate 40 mL/hr (~1/2 of goal rate).   Electrolytes in TPN: standard except reduce Phos, increase K and Mag  50 mEq/L of Na, 55 mEq/L of K, 54mEq/L of Ca, 8 mEq/L of Mag, and 10 mmol/L of Phos. Cl:Ac ratio 1:1  Add MVI to TPN only MWF (due to backorder status). Add trace elements to TPN daily  Continue famotidine in TPN  Continue moderate SSI q6h  Continue [facility]F at [facility] or per [facility]  TPN labs on Mon/Thurs  Lynelle Doctor, PharmD 03/25/19 7:40 AM

## 2019-03-26 ENCOUNTER — Inpatient Hospital Stay (HOSPITAL_COMMUNITY): Payer: Medicare Other | Admitting: Certified Registered Nurse Anesthetist

## 2019-03-26 ENCOUNTER — Encounter (HOSPITAL_COMMUNITY): Payer: Self-pay | Admitting: Internal Medicine

## 2019-03-26 ENCOUNTER — Inpatient Hospital Stay (HOSPITAL_COMMUNITY): Payer: Medicare Other

## 2019-03-26 ENCOUNTER — Encounter (HOSPITAL_COMMUNITY): Admission: EM | Disposition: A | Discharge status: Home Health | Payer: Self-pay | Source: Home / Self Care

## 2019-03-26 HISTORY — PX: PORTACATH PLACEMENT: SHX2246

## 2019-03-26 LAB — GLUCOSE, CAPILLARY
Glucose-Capillary: 120 mg/dL — ABNORMAL HIGH (ref 70–99)
Glucose-Capillary: 186 mg/dL — ABNORMAL HIGH (ref 70–99)
Glucose-Capillary: 193 mg/dL — ABNORMAL HIGH (ref 70–99)

## 2019-03-26 LAB — H. PYLORI ANTIBODY, IGG: H Pylori IgG: 0.57 Index Value (ref 0.00–0.79)

## 2019-03-26 SURGERY — INSERTION, TUNNELED CENTRAL VENOUS DEVICE, WITH PORT
Anesthesia: General

## 2019-03-26 MED ORDER — LACTATED RINGERS IV SOLN: INTRAVENOUS | Status: DC | PRN

## 2019-03-26 MED ORDER — OSMOLITE 1.5 CAL PO LIQD
1000.0000 mL | ORAL | Status: DC
  Administered 2019-03-26 – 2019-04-03 (×10): 1000 mL
  Filled 2019-03-26 (×15): qty 1000

## 2019-03-26 MED ORDER — BISACODYL 10 MG RE SUPP: 10.0000 mg | Freq: Once | RECTAL | Status: DC

## 2019-03-26 MED ORDER — HEPARIN SOD (PORK) LOCK FLUSH 100 UNIT/ML IV SOLN
INTRAVENOUS | Status: AC
  Filled 2019-03-26: qty 5

## 2019-03-26 MED ORDER — HEPARIN SOD (PORK) LOCK FLUSH 100 UNIT/ML IV SOLN
INTRAVENOUS | Status: DC | PRN
  Administered 2019-03-26: 500 [IU]

## 2019-03-26 MED ORDER — LIDOCAINE 2% (20 MG/ML) 5 ML SYRINGE
INTRAMUSCULAR | Status: DC | PRN
  Administered 2019-03-26: 100 mg via INTRAVENOUS

## 2019-03-26 MED ORDER — FENTANYL CITRATE (PF) 100 MCG/2ML IJ SOLN
INTRAMUSCULAR | Status: DC | PRN
  Administered 2019-03-26: 100 ug via INTRAVENOUS

## 2019-03-26 MED ORDER — PANTOPRAZOLE SODIUM 40 MG PO PACK
40.0000 mg | PACK | Freq: Every day | ORAL | Status: DC
  Administered 2019-03-27 – 2019-04-04 (×8): 40 mg via JEJUNOSTOMY
  Filled 2019-03-26 (×12): qty 20

## 2019-03-26 MED ORDER — MINERAL OIL PO OIL
TOPICAL_OIL | Freq: Every day | ORAL | Status: DC | PRN
  Filled 2019-03-26: qty 30

## 2019-03-26 MED ORDER — SUGAMMADEX SODIUM 200 MG/2ML IV SOLN
INTRAVENOUS | Status: DC | PRN
  Administered 2019-03-26: 200 mg via INTRAVENOUS

## 2019-03-26 MED ORDER — OXYCODONE HCL 5 MG PO TABS: 5.0000 mg | ORAL_TABLET | Freq: Once | ORAL | Status: DC | PRN

## 2019-03-26 MED ORDER — ONDANSETRON HCL 4 MG/2ML IJ SOLN
INTRAMUSCULAR | Status: DC | PRN
  Administered 2019-03-26: 4 mg via INTRAVENOUS

## 2019-03-26 MED ORDER — LACTULOSE 10 GM/15ML PO SOLN
20.0000 g | Freq: Two times a day (BID) | ORAL | Status: DC
  Administered 2019-03-26 – 2019-04-04 (×14): 20 g via JEJUNOSTOMY
  Filled 2019-03-26 (×17): qty 30

## 2019-03-26 MED ORDER — LIDOCAINE-EPINEPHRINE 1 %-1:100000 IJ SOLN
INTRAMUSCULAR | Status: AC
  Filled 2019-03-26: qty 1

## 2019-03-26 MED ORDER — PROPOFOL 10 MG/ML IV BOLUS
INTRAVENOUS | Status: DC | PRN
  Administered 2019-03-26: 130 mg via INTRAVENOUS

## 2019-03-26 MED ORDER — ONDANSETRON HCL 4 MG/2ML IJ SOLN
INTRAMUSCULAR | Status: AC
  Filled 2019-03-26: qty 2

## 2019-03-26 MED ORDER — 0.9 % SODIUM CHLORIDE (POUR BTL) OPTIME
TOPICAL | Status: DC | PRN
  Administered 2019-03-26: 1000 mL

## 2019-03-26 MED ORDER — SUCCINYLCHOLINE CHLORIDE 200 MG/10ML IV SOSY
PREFILLED_SYRINGE | INTRAVENOUS | Status: DC | PRN
  Administered 2019-03-26: 120 mg via INTRAVENOUS

## 2019-03-26 MED ORDER — BUPIVACAINE HCL 0.25 % IJ SOLN
INTRAMUSCULAR | Status: DC | PRN
  Administered 2019-03-26: 15 mL

## 2019-03-26 MED ORDER — OXYCODONE HCL 5 MG/5ML PO SOLN: 5.0000 mg | Freq: Once | ORAL | Status: DC | PRN

## 2019-03-26 MED ORDER — ROCURONIUM BROMIDE 10 MG/ML (PF) SYRINGE
PREFILLED_SYRINGE | INTRAVENOUS | Status: DC | PRN
  Administered 2019-03-26: 40 mg via INTRAVENOUS

## 2019-03-26 MED ORDER — PHENYLEPHRINE HCL (PRESSORS) 10 MG/ML IV SOLN
INTRAVENOUS | Status: DC | PRN
  Administered 2019-03-26: 120 ug via INTRAVENOUS
  Administered 2019-03-26: 80 ug via INTRAVENOUS

## 2019-03-26 MED ORDER — DEXAMETHASONE SODIUM PHOSPHATE 10 MG/ML IJ SOLN
INTRAMUSCULAR | Status: DC | PRN
  Administered 2019-03-26: 5 mg via INTRAVENOUS

## 2019-03-26 MED ORDER — SODIUM CHLORIDE 0.9 % IV SOLN
Freq: Once | INTRAVENOUS | Status: AC
  Filled 2019-03-26: qty 1.2

## 2019-03-26 MED ORDER — MORPHINE SULFATE (PF) 2 MG/ML IV SOLN: 2.0000 mg | INTRAVENOUS | Status: DC | PRN

## 2019-03-26 MED ORDER — LIDOCAINE-EPINEPHRINE 1 %-1:100000 IJ SOLN
INTRAMUSCULAR | Status: DC | PRN
  Administered 2019-03-26: 15 mL

## 2019-03-26 MED ORDER — LIDOCAINE 2% (20 MG/ML) 5 ML SYRINGE
INTRAMUSCULAR | Status: AC
  Filled 2019-03-26: qty 5

## 2019-03-26 MED ORDER — CEFAZOLIN SODIUM-DEXTROSE 2-4 GM/100ML-% IV SOLN
2.0000 g | INTRAVENOUS | Status: AC
  Administered 2019-03-26: 2 g via INTRAVENOUS
  Filled 2019-03-26 (×2): qty 100

## 2019-03-26 MED ORDER — PROMETHAZINE HCL 25 MG/ML IJ SOLN: 6.2500 mg | INTRAMUSCULAR | Status: DC | PRN

## 2019-03-26 MED ORDER — FENTANYL CITRATE (PF) 100 MCG/2ML IJ SOLN
INTRAMUSCULAR | Status: AC
  Filled 2019-03-26: qty 2

## 2019-03-26 MED ORDER — PROPOFOL 10 MG/ML IV BOLUS
INTRAVENOUS | Status: AC
  Filled 2019-03-26: qty 20

## 2019-03-26 MED ORDER — HYDROMORPHONE HCL 1 MG/ML IJ SOLN: 0.2500 mg | INTRAMUSCULAR | Status: DC | PRN

## 2019-03-26 MED ORDER — BUPIVACAINE HCL 0.25 % IJ SOLN
INTRAMUSCULAR | Status: AC
  Filled 2019-03-26: qty 1

## 2019-03-26 MED ORDER — ROCURONIUM BROMIDE 10 MG/ML (PF) SYRINGE
PREFILLED_SYRINGE | INTRAVENOUS | Status: AC
  Filled 2019-03-26: qty 10

## 2019-03-26 MED ORDER — DEXAMETHASONE SODIUM PHOSPHATE 10 MG/ML IJ SOLN
INTRAMUSCULAR | Status: AC
  Filled 2019-03-26: qty 1

## 2019-03-26 SURGICAL SUPPLY — 31 items
APL PRP STRL LF DISP 70% ISPRP (MISCELLANEOUS) ×1
BAG DECANTER FOR FLEXI CONT (MISCELLANEOUS) ×2 IMPLANT
BLADE SURG SZ11 CARB STEEL (BLADE) ×2 IMPLANT
CHLORAPREP W/TINT 26 (MISCELLANEOUS) ×2 IMPLANT
COVER PROBE U/S 5X48 (MISCELLANEOUS) ×2 IMPLANT
COVER SURGICAL LIGHT HANDLE (MISCELLANEOUS) ×2 IMPLANT
COVER WAND RF STERILE (DRAPES) IMPLANT
DECANTER SPIKE VIAL GLASS SM (MISCELLANEOUS) ×2 IMPLANT
DRAPE C-ARM 42X120 X-RAY (DRAPES) ×2 IMPLANT
DRAPE LAPAROTOMY T 98X78 PEDS (DRAPES) ×2 IMPLANT
DRSG TEGADERM 2-3/8X2-3/4 SM (GAUZE/BANDAGES/DRESSINGS) IMPLANT
DRSG TEGADERM 4X4.75 (GAUZE/BANDAGES/DRESSINGS) ×1 IMPLANT
ELECT REM PT RETURN 15FT ADLT (MISCELLANEOUS) ×2 IMPLANT
GAUZE 4X4 16PLY RFD (DISPOSABLE) ×2 IMPLANT
GAUZE SPONGE 2X2 8PLY STRL LF (GAUZE/BANDAGES/DRESSINGS) ×1 IMPLANT
GLOVE ECLIPSE 8.0 STRL XLNG CF (GLOVE) ×2 IMPLANT
GLOVE INDICATOR 8.0 STRL GRN (GLOVE) ×2 IMPLANT
GOWN STRL REUS W/TWL XL LVL3 (GOWN DISPOSABLE) ×4 IMPLANT
KIT BASIN OR (CUSTOM PROCEDURE TRAY) ×2 IMPLANT
KIT PORT POWER 8FR ISP CVUE (Port) ×1 IMPLANT
KIT TURNOVER KIT A (KITS) IMPLANT
NEEDLE HYPO 22GX1.5 SAFETY (NEEDLE) ×2 IMPLANT
PACK BASIC VI WITH GOWN DISP (CUSTOM PROCEDURE TRAY) ×2 IMPLANT
PENCIL SMOKE EVACUATOR (MISCELLANEOUS) IMPLANT
SPONGE GAUZE 2X2 STER 10/PKG (GAUZE/BANDAGES/DRESSINGS) ×1
SUT MNCRL AB 4-0 PS2 18 (SUTURE) ×2 IMPLANT
SUT PROLENE 2 0 SH DA (SUTURE) ×4 IMPLANT
SYR 10ML LL (SYRINGE) ×2 IMPLANT
SYR 20ML LL LF (SYRINGE) ×2 IMPLANT
TOWEL OR 17X26 10 PK STRL BLUE (TOWEL DISPOSABLE) ×2 IMPLANT
TOWEL OR NON WOVEN STRL DISP B (DISPOSABLE) ×2 IMPLANT

## 2019-03-26 NOTE — Progress Notes (Signed)
PHARMACY - TOTAL PARENTERAL NUTRITION CONSULT NOTE   Indication: GOO  Patient Measurements: Height: 5\' 8"  (172.7 cm) Weight: 170 lb 10.2 oz (77.4 kg) IBW/kg (Calculated) : 68.4 TPN AdjBW (KG): 77.8 Body mass index is 25.95 kg/m.   Assessment:  Patient is a 78 y.o M presented to ED on 12/3 with c/o recurrent emesis and abdominal discomfort.  Abdominal CT on 12/2 showed findings consistent with "outlet obstruction or severe dysmotility" and suspected gastric mass. He underwent EGD with biopsy on 12/4 and repeat EUS/EGD with dilation/biopsy on 12/8.  Biopsy done on 12/8 came back with evidence of gastric adenocarcinoma.  CCS recom. surgery with this admssion and to start TPN on 12/11.  Glucose / Insulin:  cbgs 156-186 (goal 100-150),  - used 9 units of SSI since TPN in 24 hours Electrolytes:  WNL, including corrected Ca (12/21) Renal: SCr/BUN - WNL, stable LFTs / TGs: - AST/ALT up 46/77 (12/22) - TG 96 (12/12), 105 (12/14) , 87 (12/21 Prealbumin / albumin:  - albumin 2.9 (12/11), 3.2 (12/14), 3.1 (12/17), 3.1 (12/21) - prealbumin 18 (12/11), 20.1 (12/14) , 87 (12/21) Intake / Output; MIVF: I/O - 933; IVF at [place]: Surgeries / Procedures:  - 12/4 EGD: LA grade D esophagitis, severe stenosis was found at the pylorus, large amount of food residue in stomach, biopsies taken - 12/7: biopsies negative for cancer - 12/8: EUS and EGD with dilation (severe pylorus stenosis- biopsied and dilated) - 12/10: pylorus biopsy showed evidence of gastric adenocarcinoma - 12/15 Expl lap: distal gastrectomy with BII, G & J tube placement  Central access: 12/11 PICC   TPN start date: 12/11   Nutritional Goals (per RD recommendation on 12/20): Kcal:  1900-2100 Protein:  95-105g Fluid:  >/= 2 L/day  Goal TPN rate 85 mL/hr (provides 96 g of protein and 2045 kcals per day)  Current Nutrition:  NPO,  TPN at reduced rate - pedialyte at 10 ml/hr - osmolite at 40 ml/hr  Plan:  - Per Will  [place], d/c TPN when bag runs out today at [place] - continue TPN at 40 mL/hr and d/c at [place] will sign off  Lynelle Doctor, PharmD 03/26/19 7:07 AM

## 2019-03-26 NOTE — Plan of Care (Signed)

## 2019-03-26 NOTE — Progress Notes (Signed)
Patient recovery room in no acute distress.  Chest x-ray notes tip of portacatheter in the SVC/right atrial junction.  Called patient's wife twice without answer.  She then called back.  I discussed operative findings, updated the patient's status, discussed probable steps to recovery, and gave postoperative recommendations to the patient's spouse.  Recommendations were made.  Questions were answered.  She expressed understanding & appreciation.

## 2019-03-26 NOTE — Anesthesia Procedure Notes (Signed)
Procedure Name: Intubation Date/Time: 03/26/2019 3:15 PM Performed by: Talbot Grumbling, CRNA Pre-anesthesia Checklist: Patient identified, Emergency Drugs available, Suction available and Patient being monitored Patient Re-evaluated:Patient Re-evaluated prior to induction Oxygen Delivery Method: Circle system utilized Preoxygenation: Pre-oxygenation with 100% oxygen Induction Type: IV induction, Rapid sequence and Cricoid Pressure applied Laryngoscope Size: Mac and 3 Grade View: Grade I Tube type: Oral Tube size: 7.5 mm Number of attempts: 1 Airway Equipment and Method: Stylet Placement Confirmation: ETT inserted through vocal cords under direct vision,  positive ETCO2 and breath sounds checked- equal and bilateral Secured at: 23 cm Tube secured with: Tape Dental Injury: Teeth and Oropharynx as per pre-operative assessment

## 2019-03-26 NOTE — Progress Notes (Signed)
OT Cancellation Note  Patient Details Name: Jonathan Lewis MRN: [id number] DOB: 23-Dec-1940   Cancelled Treatment:    Reason Eval/Treat Not Completed: Patient at procedure or test/ unavailable.  Going for port a cath.  Will reattempt.  Nilsa Nutting., OTR/L Acute Rehabilitation Services Pager [phone] Office [phone]   Lucille Passy M 03/26/2019, 12:52 PM

## 2019-03-26 NOTE — Anesthesia Preprocedure Evaluation (Addendum)
Anesthesia Evaluation  Patient identified by MRN, date of birth, ID band Patient awake    Reviewed: Allergy & Precautions, NPO status , Patient's Chart, lab work & pertinent test results  Airway Mallampati: II  TM Distance: >3 FB Neck ROM: Full    Dental no notable dental hx.    Pulmonary neg pulmonary ROS, former smoker,    Pulmonary exam normal breath sounds clear to auscultation       Cardiovascular hypertension, Pt. on medications negative cardio ROS Normal cardiovascular exam Rhythm:Regular Rate:Normal  ECG: NSR, rate 85   Neuro/Psych negative neurological ROS  negative psych ROS   GI/Hepatic negative GI ROS, Neg liver ROS, GERD  Medicated,  Endo/Other  negative endocrine ROSdiabetes  Renal/GU negative Renal ROS  negative genitourinary   Musculoskeletal negative musculoskeletal ROS (+)   Abdominal   Peds negative pediatric ROS (+)  Hematology negative hematology ROS (+) anemia , HLD   Anesthesia Other Findings Cancer  Reproductive/Obstetrics negative OB ROS                            Anesthesia Physical Anesthesia Plan  ASA: III  Anesthesia Plan: General   Post-op Pain Management:    Induction: Intravenous, Rapid sequence and Cricoid pressure planned  PONV Risk Score and Plan: 2 and Ondansetron, Dexamethasone, Midazolam and Treatment may vary due to age or medical condition  Airway Management Planned: Oral ETT  Additional Equipment:   Intra-op Plan:   Post-operative Plan: Extubation in OR  Informed Consent: I have reviewed the patients History and Physical, chart, labs and discussed the procedure including the risks, benefits and alternatives for the proposed anesthesia with the patient or authorized representative who has indicated his/her understanding and acceptance.     Dental advisory given  Plan Discussed with: CRNA  Anesthesia Plan Comments:         Anesthesia Quick Evaluation

## 2019-03-26 NOTE — Progress Notes (Signed)
Physical Therapy Treatment Patient Details Name: Jonathan Lewis MRN: [id number] DOB: 06/17/1940 Today's Date: 03/26/2019    History of Present Illness Pt is a 78 year old male s/p ex lap with distal gastrectomy and Billroth II GJ with portal lymph node dissection and placement of a 24FG-tube and separate J-tube, Dr. Redmond Pulling 03/19/19 for Gastric outlet obstruction secondary to adenocarcinoma    PT Comments    Pt ambulated in hallway.  Pt does not require assistive device and steady with ambulation.  Pt encouraged to ambulate with staff (only concern is for lines).  Pt has met goals and will discharge from acute PT.  Follow Up Recommendations  Home health PT;Supervision - Intermittent     Equipment Recommendations  None recommended by PT    Recommendations for Other Services       Precautions / Restrictions Precautions Precautions: Fall Precaution Comments: G tube, J tube Restrictions Weight Bearing Restrictions: No    Mobility  Bed Mobility Overal bed mobility: Modified Independent                Transfers Overall transfer level: Needs assistance Equipment used: None Transfers: Sit to/from Stand Sit to Stand: Supervision            Ambulation/Gait Ambulation/Gait assistance: Supervision Gait Distance (Feet): 400 Feet Assistive device: None Gait Pattern/deviations: WFL(Within Functional Limits)         Stairs             Wheelchair Mobility    Modified Rankin (Stroke Patients Only)       Balance                                            Cognition Arousal/Alertness: Awake/alert Behavior During Therapy: WFL for tasks assessed/performed Overall Cognitive Status: Within Functional Limits for tasks assessed                                        Exercises      General Comments        Pertinent Vitals/Pain Pain Assessment: No/denies pain    Home Living                      Prior  Function            PT Goals (current goals can now be found in the care plan section) Progress towards PT goals: Goals met/education completed, patient discharged from PT    Frequency    Min 3X/week      PT Plan Current plan remains appropriate    Co-evaluation              AM-PAC PT "6 Clicks" Mobility   Outcome Measure  Help needed turning from your back to your side while in a flat bed without using bedrails?: None Help needed moving from lying on your back to sitting on the side of a flat bed without using bedrails?: None Help needed moving to and from a bed to a chair (including a wheelchair)?: None Help needed standing up from a chair using your arms (e.g., wheelchair or bedside chair)?: None Help needed to walk in hospital room?: A Little Help needed climbing 3-5 steps with a railing? : A Little 6 Click Score: 22  End of Session   Activity Tolerance: Patient tolerated treatment well Patient left: Other (comment)(in bathroom on toilet, pt agreeable to use pull cord) Nurse Communication: Mobility status PT Visit Diagnosis: Difficulty in walking, not elsewhere classified (R26.2)     Time: 0920-0929 PT Time Calculation (min) (ACUTE ONLY): 9 min  Charges:  $Gait Training: 8-22 mins                    Arlyce Dice, DPT Acute Rehabilitation Services Office: [phone]   Trena Platt 03/26/2019, 12:54 PM

## 2019-03-26 NOTE — Op Note (Signed)
03/26/2019  4:14 PM  PATIENT:  Jonathan Lewis  78 y.o. male  Patient Care Team: Biagio Borg, MD as PCP - General (Internal Medicine) Earnie Larsson, MD as Consulting Physician (Neurosurgery) Lavonia Dana, MD as Referring Physician (Otolaryngology) Suella Broad, MD as Consulting Physician (Physical Medicine and Rehabilitation)  PRE-OPERATIVE DIAGNOSIS:  CA  POST-OPERATIVE DIAGNOSIS:  CA  PROCEDURE:  Procedure(s): INSERTION PORT-A-CATH  SURGEON:  Adin Hector, MD  ASSISTANT: none   ANESTHESIA:   local and general  EBL:  Total I/O In: 1203.3 [P.O.:360; I.V.:703.3; Other:40; IV Piggyback:100] Out: 10 [Blood:10]  Delay start of Pharmacological VTE agent (>24hrs) due to surgical blood loss or risk of bleeding:  no  DRAINS: none   SPECIMEN:  No Specimen  DISPOSITION OF SPECIMEN:  N/A  COUNTS:  YES  PLAN OF CARE: Admit to inpatient   PATIENT DISPOSITION:  PACU - hemodynamically stable.  INDICATION: Patient with need for IV therapy.  Gastric cancer pT3pN2 s/p distal gastrectomy with Billroth II gastrojejunostomy 03/19/2019.    Port-A-Cath placement was requested.  Use of a central venous catheter for intravenous therapy was discussed.  Technique of catheter placement using ultrasound and fluoroscopy guidance was discussed.  Risks such as bleeding, infection, pneumothorax, catheter occlusion, reoperation, and other risks were discussed.   I noted a good likelihood this will help address the problem.  Questions were answered.  The patient expressed understanding & wishes to proceed.  OR FINDINGS: Normal-appearing anatomy.  Is an 8 [place] power port. It goes through the right internal jugular vein  DESCRIPTION:   Procedure: Informed consent was confirmed. Patient was brought the operating room. and positioned supine. Arms were tucked. The patient underwent deep sedation. Neck and chest were clipped and prepped and draped in a sterile fashion. A surgical timeout  confirmed our plan.  I placed a field block of local anesthesia on the neck & chest  I used the ultrasound to identify the right internal jugular vein. I entered into it using on the first venipuncture under direct ultrasound guidance. Wire was passed into the inferior vena cava by fluoroscopy.  I made an incision in the lateral infraclavicular pocket. Made a subcutaneous pocket. I tunneled the power port from the chest wound to the neck puncture site. The port was secured to the left anterior chest wall using 2-0 Prolene interrupted stitches x4. Catheter flushed well.  I used a dilator on the wire using Seldinger technique to dilate the neck tract. Then I used a dilator with a peel away sheath.  We used fluoroscopy.  I pulled the wire back into the right atrial/superior vena cava junction.  I pulled the wire back until it was at the neck puncture site. This gave the true measurement of the intravenous segment. I cut the catheter to appropriate length. I removed the wire and dilator. The catheter was placed within the sheath. The sheath was peeled away.  Fluoroscopy confirmed the tip in the distal SVC.   Catheter aspirated and flushed well with dilute heparin solution.  Final flush of stronger heparin solution given. On final fluoro reevaluation the tip seen to be in good position in the distal SVC.    I closed the wounds using 4 Monocryl stitch & placed sterile dressings.  Catheter is okay to use.  Adin Hector, M.D., F.A.C.S. Gastrointestinal and Minimally Invasive Surgery [facility] [address] [place], [place] [address] [phone] Main / Paging

## 2019-03-26 NOTE — Progress Notes (Signed)
7 Days Post-Op    CC: Nausea and vomiting  Subjective: Patient is doing well this a.m.  He is tolerating clears and tube feeding well.Marland Kitchen  His tube feeding is up to 50 cc/h and they plan to advance that further today.  He has still not had a bowel movement but does report flatus.  PT walked with him some yesterday.  His midline incision looks good.  His gastrostomy tube is capped and he has bilious fluid in it.    Objective: Vital signs in last 24 hours: Temp:  [98.1 F (36.7 C)-98.3 F (36.8 C)] 98.1 F (36.7 C) (12/22 0520) Pulse Rate:  [76-81] 80 (12/22 0520) Resp:  [16-18] 18 (12/22 0520) BP: (112-131)/(73-82) 112/73 (12/22 0520) SpO2:  [99 %-100 %] 99 % (12/22 0520) Last BM Date: 03/15/19 300 p.o. recorded 1299 per feeding tube 1200 urine No BM recorded Afebrile vital signs are stable No labs this a.m. Prealbumin is 26.3. WBC 4.9, H/H 10.3/31.9.  Intake/Output from previous day: 12/21 0701 - 12/22 0700 In: 3342.6 [P.O.:300; I.V.:1733.2; NG/GT:1299.3] Out: 1200 [Urine:1200] Intake/Output this shift: No intake/output data recorded.  General appearance: alert, cooperative and no distress Resp: clear to auscultation bilaterally GI: Soft, sore, tube feeding is at 50.  Gastrostomy tube is fine there is green bilious fluid within it.  Positive flatus no BM.  Lab Results:  Recent Labs    03/25/19 0331  WBC 4.9  HGB 10.3*  HCT 31.9*  PLT 212    BMET Recent Labs    03/24/19 0341 03/25/19 0331  NA 136 135  K 4.4 4.4  CL 101 101  CO2 26 27  GLUCOSE 152* 173*  BUN 23 22  CREATININE 0.79 0.74  CALCIUM 8.6* 8.7*   PT/INR No results for input(s): LABPROT, INR in the last 72 hours.  Recent Labs  Lab 03/21/19 0309 03/25/19 0331  AST 14* 46*  ALT 16 77*  ALKPHOS 40 101  BILITOT 0.6 0.9  PROT 5.9* 6.0*  ALBUMIN 3.1* 3.1*     Lipase     Component Value Date/Time   LIPASE 30 03/07/2019 1000     Medications: . Chlorhexidine Gluconate Cloth  6 each  Topical Daily  . docusate  100 mg Per Tube Daily  . enoxaparin (LOVENOX) injection  40 mg Subcutaneous Q24H  . gabapentin  100 mg Per Tube Q12H  . insulin aspart  0-15 Units Subcutaneous Q6H  . latanoprost  1 drop Both Eyes QHS  . mouth rinse  15 mL Mouth Rinse BID  . pantoprazole (PROTONIX) IV  40 mg Intravenous Q12H  . pravastatin  40 mg Oral Daily  . sodium chloride flush  10-40 mL Intracatheter Q12H   . sodium chloride 10 mL/hr at 03/25/19 1815  . dextrose 5 % and 0.9% NaCl 10 mL/hr at 03/25/19 0900  . feeding supplement (OSMOLITE 1.5 CAL) 1,000 mL (03/26/19 0859)  . methocarbamol (ROBAXIN) IV    . Pedialyte 240 mL (03/19/19 2200)  . TPN ADULT (ION) 40 mL/hr at 03/25/19 1815    Assessment/Plan Hypertension Diabetes -secondary to TNA/tube feeds Mild malnutrition -treated  Gastric ulcers/GERD - PPI   Distal gastric cancer Exploratory laparotomy, distal gastrectomy with Billroth II gastrojejunostomy, portal lymph node dissection, placement of a 24 French gastrostomy tube, placement of feeding jejunostomy tube 03/20/2019, Dr. Greer Pickerel, POD #6  - Pathology: Invasive adenocarcinoma, moderately differentiated spanning 3 cm;     2/20 nodes positive; chronic active gastritis and ulcers   FEN: TPN/tube feeds/clear  liquids ID: None DVT: SCDs/Lovenox Follow-up: Dr. Edwena Blow. Burney Gauze  Plan: Have the staff start teaching patient, and family how to do tube feedings, care for the G-tube and J-tube.  Continue to mobilize.  I will add a little mineral oil to the J-tube to see if this helps with BM.  We will also give him a Dulcolax suppository today.  Home health is being arranged through [facility], he has a home health RN and physical therapy ordered.  Aim for discharge in the next 24 to 48 hours pending his ability to handle feedings/care of his tubes on his own.      LOS: 19 days    Franck Vinal 03/26/2019 Please see Amion

## 2019-03-26 NOTE — Transfer of Care (Signed)
Immediate Anesthesia Transfer of Care Note  Patient: Jonathan Lewis  Procedure(s) Performed: INSERTION PORT-A-CATH (N/A )  Patient Location: PACU  Anesthesia Type:General  Level of Consciousness: sedated  Airway & Oxygen Therapy: Patient Spontanous Breathing and Patient connected to face mask oxygen  Post-op Assessment: Report given to RN and Post -op Vital signs reviewed and stable  Post vital signs: Reviewed and stable  Last Vitals:  Vitals Value Taken Time  BP 125/78 03/26/19 1615  Temp    Pulse    Resp 15 03/26/19 1616  SpO2    Vitals shown include unvalidated device data.  Last Pain:  Vitals:   03/26/19 1344  TempSrc:   PainSc: 0-No pain      Patients Stated Pain Goal: 3 ([id number])  Complications: No apparent anesthesia complications

## 2019-03-27 ENCOUNTER — Encounter: Payer: Self-pay | Admitting: *Deleted

## 2019-03-27 ENCOUNTER — Ambulatory Visit: Payer: Medicare Other | Admitting: Gastroenterology

## 2019-03-27 LAB — GLUCOSE, CAPILLARY
Glucose-Capillary: 165 mg/dL — ABNORMAL HIGH (ref 70–99)
Glucose-Capillary: 97 mg/dL (ref 70–99)
Glucose-Capillary: 117 mg/dL — ABNORMAL HIGH (ref 70–99)

## 2019-03-27 MED ORDER — BISACODYL 10 MG RE SUPP
10.0000 mg | Freq: Once | RECTAL | Status: AC
  Administered 2019-03-27: 10 mg via RECTAL
  Filled 2019-03-27: qty 1

## 2019-03-27 NOTE — Plan of Care (Signed)

## 2019-03-27 NOTE — Progress Notes (Signed)
1 Day Post-Op    CC:  Nausea and vomiting  Subjective: Vomiting last PM, we have decreased TF to 50 ml/hr and opened G-tube to straight drain.  So far there is about 100 out, it dies not look like TF.    Objective: Vital signs in last 24 hours: Temp:  [97.4 F (36.3 C)-99.1 F (37.3 C)] 98.6 F (37 C) (12/23 0847) Pulse Rate:  [70-93] 84 (12/23 0847) Resp:  [10-18] 17 (12/23 0847) BP: (101-127)/(67-92) 103/67 (12/23 0847) SpO2:  [95 %-100 %] 100 % (12/23 0847) Weight:  [77.1 kg] 77.1 kg (12/22 1344) Last BM Date: 03/15/19 500 PO 997 IV 385? NG 1350 urine Drain 0 No BM recorded Afebrile, VSS No labs/radiology   Intake/Output from previous day: 12/22 0701 - 12/23 0700 In: 1982.7 [P.O.:500; I.V.:997.7; NG/GT:330; IV Piggyback:100] Out: 1360 [Urine:1350; Blood:10] Intake/Output this shift: Total I/O In: 40 [I.V.:10; Other:30] Out: 200 [Drains:200]  General appearance: alert, cooperative and no distress Resp: clear to auscultation bilaterally GI: soft, sore sites OK, G tube open now with green gastric content so far.  NO BM  so far post op.  Lab Results:  Recent Labs    03/25/19 0331  WBC 4.9  HGB 10.3*  HCT 31.9*  PLT 212    BMET Recent Labs    03/25/19 0331  NA 135  K 4.4  CL 101  CO2 27  GLUCOSE 173*  BUN 22  CREATININE 0.74  CALCIUM 8.7*   PT/INR No results for input(s): LABPROT, INR in the last 72 hours.  Recent Labs  Lab 03/21/19 0309 03/25/19 0331  AST 14* 46*  ALT 16 77*  ALKPHOS 40 101  BILITOT 0.6 0.9  PROT 5.9* 6.0*  ALBUMIN 3.1* 3.1*     Lipase     Component Value Date/Time   LIPASE 30 03/07/2019 1000     Medications: . bisacodyl  10 mg Rectal Once  . Chlorhexidine Gluconate Cloth  6 each Topical Daily  . docusate  100 mg Per Tube Daily  . enoxaparin (LOVENOX) injection  40 mg Subcutaneous Q24H  . gabapentin  100 mg Per Tube Q12H  . insulin aspart  0-15 Units Subcutaneous Q6H  . lactulose  20 g Per J Tube BID  .  latanoprost  1 drop Both Eyes QHS  . mouth rinse  15 mL Mouth Rinse BID  . pantoprazole sodium  40 mg Per J Tube Daily  . pravastatin  40 mg Oral Daily  . sodium chloride flush  10-40 mL Intracatheter Q12H    Assessment/Plan Hypertension Diabetes-secondary to TNA/tube feeds Mild malnutrition-treated  Gastric ulcers/GERD- PPI   Distal gastric cancer Exploratory laparotomy, distal gastrectomy with Billroth II gastrojejunostomy, portal lymph node dissection, placement of a 24 French gastrostomy tube, placement of feeding jejunostomy tube 03/20/2019, Dr. Greer Pickerel, POD #7 -Pathology: Invasive adenocarcinoma, moderately differentiated spanning 3 cm; 2/20 nodes positive; chronic active gastritis and ulcers Port-A-Cath placement 03/26/19, Dr. Michael Boston; POD#1   FEN: TPN discontinued/tube feedings@60cc /hr/clear liquids ID: None DVT: SCDs/Lovenox Follow-up: Dr. Edwena Blow. Burney Gauze  Plan:  He is now back on G-tube drainage and we will see how much comes out.  TF down to 50 ml/hr, we have stopped TPn. He has follow up for staple removal, and DR. Wilson.  Home health should be arranged.  I have ask them to double check.      LOS: 20 days    Jonathan Lewis 03/27/2019 Please see Amion

## 2019-03-27 NOTE — Evaluation (Signed)
Occupational Therapy Evaluation Patient Details Name: Jonathan Lewis MRN: [id number] DOB: 1941-01-21 Today's Date: 03/27/2019    History of Present Illness Pt is a 78 year old male s/p ex lap with distal gastrectomy and Billroth II GJ with portal lymph node dissection and placement of a 24FG-tube and separate J-tube, Dr. Redmond Pulling 03/19/19 for Gastric outlet obstruction secondary to adenocarcinoma.  S/p port 12/22.   Clinical Impression   Pt was admitted for the above.  At baseline, he is independent with adls.  Wife present and will assist as needed. Pt has a high commode and was able to get on/off this without physical assistance. They do have a seat they can place in the shower and are considering grab bars.  Pt walked the entire length of the hall with supervision; he did use a RW today. Will follow in acute setting to further educate on tub transfer.    Follow Up Recommendations  No OT follow up;Supervision/Assistance - 24 hour    Equipment Recommendations  (likely none)    Recommendations for Other Services       Precautions / Restrictions Precautions Precautions: Fall Precaution Comments: G tube, J tube and porta a cath Restrictions Weight Bearing Restrictions: No      Mobility Bed Mobility               General bed mobility comments: supervision, rolling from flat bed (HOB 20)  Transfers   Equipment used: Rolling walker (2 wheeled)   Sit to Stand: Supervision         General transfer comment: cues for hand placement. Pt states he sometimes uses RW here and sometimes does not; wanted to use it today    Balance                                           ADL either performed or assessed with clinical judgement   ADL Overall ADL's : Needs assistance/impaired Eating/Feeding: Independent   Grooming: Wash/dry hands;Supervision/safety;Standing                   Toilet Transfer: Min guard;Ambulation;BSC;RW;Comfort height toilet    Toileting- Clothing Manipulation and Hygiene: Min guard;Sit to/from stand;Sitting/lateral lean         General ADL Comments: pt completed toileting and grooming in bathroom. He has a comfort height commode and was able to get on/off this.  They do have a shower seat that they can use in shower; wife is considering grab bars.  She will help with LB adls as needed.  Based on clinical judgment, pt needs set up for UB adls and mod A for LB adls for comfort     Vision         Perception     Praxis      Pertinent Vitals/Pain Pain Assessment: Faces Faces Pain Scale: Hurts a little bit Pain Location: stomach Pain Descriptors / Indicators: Sore Pain Intervention(s): Limited activity within patient's tolerance;Monitored during session     Hand Dominance     Extremity/Trunk Assessment Upper Extremity Assessment Upper Extremity Assessment: Overall WFL for tasks assessed           Communication Communication Communication: No difficulties   Cognition Arousal/Alertness: Awake/alert Behavior During Therapy: WFL for tasks assessed/performed Overall Cognitive Status: Within Functional Limits for tasks assessed  General Comments  pt ambulated length of hall today with supervision/assist for lines    Exercises     Shoulder Instructions      Home Living Family/patient expects to be discharged to:: Private residence Living Arrangements: Spouse/significant other Available Help at Discharge: Family;Available 24 hours/day               Bathroom Shower/Tub: Teacher, early years/pre: Handicapped height     Home Equipment: None          Prior Functioning/Environment Level of Independence: Independent                 OT Problem List: Pain;Decreased knowledge of use of DME or AE      OT Treatment/Interventions: Self-care/ADL training;DME and/or AE instruction;Patient/family education    OT  Goals(Current goals can be found in the care plan section) Acute Rehab OT Goals Patient Stated Goal: home OT Goal Formulation: With patient/family Time For Goal Achievement: 04/03/19 Potential to Achieve Goals: Good ADL Goals Pt Will Perform Tub/Shower Transfer: Tub transfer;with min guard assist;ambulating;shower seat  OT Frequency: Min 1X/week   Barriers to D/C:            Co-evaluation              AM-PAC OT "6 Clicks" Daily Activity     Outcome Measure Help from another person eating meals?: None Help from another person taking care of personal grooming?: A Little Help from another person toileting, which includes using toliet, bedpan, or urinal?: A Little Help from another person bathing (including washing, rinsing, drying)?: A Lot Help from another person to put on and taking off regular upper body clothing?: A Little Help from another person to put on and taking off regular lower body clothing?: A Lot 6 Click Score: 17   End of Session    Activity Tolerance: Patient tolerated treatment well Patient left: in chair;with call bell/phone within reach;with family/visitor present  OT Visit Diagnosis: Muscle weakness (generalized) (M62.81)                Time: [id number] OT Time Calculation (min): 43 min Charges:  OT General Charges $OT Visit: 1 Visit OT Evaluation $OT Eval Low Complexity: 1 Low OT Treatments $Self Care/Home Management : 8-22 mins $Therapeutic Activity: 8-22 mins  Waris Rodger S, OTR/L Acute Rehabilitation Services 03/27/2019  [place] 03/27/2019, 12:16 PM

## 2019-03-27 NOTE — Progress Notes (Signed)
Pt's feeding was gradually increased to 107ml/hr. In few hours pt started  c/o moderate nausea and feeling that he is about to throw up. Zofran was given, some effect was noted, but still feels nausea. fedding rate was decreased to 96ml/hr.Tolerating well at this time.

## 2019-03-28 LAB — GLUCOSE, CAPILLARY
Glucose-Capillary: 153 mg/dL — ABNORMAL HIGH (ref 70–99)
Glucose-Capillary: 130 mg/dL — ABNORMAL HIGH (ref 70–99)

## 2019-03-28 NOTE — Progress Notes (Signed)
2 Days Post-Op   Subjective/Chief Complaint: No further nausea or emesis with G-tube to gravity. 800 cc's out Tolerating j-tube feeds   Objective: Vital signs in last 24 hours: Temp:  [98.3 F (36.8 C)-98.7 F (37.1 C)] 98.7 F (37.1 C) (12/24 0609) Pulse Rate:  [66-82] 82 (12/24 0609) Resp:  [14-20] 14 (12/24 0609) BP: (97-117)/(72-82) 97/72 (12/24 0609) SpO2:  [98 %-100 %] 98 % (12/24 0609) Weight:  [80.5 kg] 80.5 kg (12/24 0512) Last BM Date: 03/27/19  Intake/Output from previous day: 12/23 0701 - 12/24 0700 In: 1952 [P.O.:330; I.V.:241; NG/GT:1141] Out: 2000 [Urine:1200; Drains:800] Intake/Output this shift: No intake/output data recorded.  Exam: Awake and alert Looks comfortable g-tube with bile Abdomen soft, non-distended  Lab Results:  No results for input(s): WBC, HGB, HCT, PLT in the last 72 hours. BMET No results for input(s): NA, K, CL, CO2, GLUCOSE, BUN, CREATININE, CALCIUM in the last 72 hours. PT/INR No results for input(s): LABPROT, INR in the last 72 hours. ABG No results for input(s): PHART, HCO3 in the last 72 hours.  Invalid input(s): PCO2, PO2  Studies/Results: DG Chest Port 1 View  Result Date: 03/26/2019 CLINICAL DATA:  Port-A-Cath placement EXAM: PORTABLE CHEST 1 VIEW COMPARISON:  01/14/2019 FINDINGS: Right PICC line and right Port-A-Cath in place. The Port-A-Cath tip is in the SVC. PICC line tip is in the upper right atrium. Heart is normal size. No pneumothorax. Probable calcified granulomas in the right mid lung. No confluent opacities or effusions. No acute bony abnormality. IMPRESSION: Right Port-A-Cath tip in the SVC. Right PICC line tip in the upper right atrium. No pneumothorax. No acute cardiopulmonary disease. Electronically Signed   By: Rolm Baptise M.D.   On: 03/26/2019 17:31   DG C-Arm 1-60 Min-No Report  Result Date: 03/26/2019 Fluoroscopy was utilized by the requesting physician.  No radiographic interpretation.     Anti-infectives: Anti-infectives (From admission, onward)   Start     Dose/Rate Route Frequency Ordered Stop   03/26/19 1015  ceFAZolin (ANCEF) IVPB 2g/100 mL premix     2 g 200 mL/hr over 30 Minutes Intravenous On call to O.R. 03/26/19 1002 03/26/19 1533   03/19/19 0600  cefoTEtan (CEFOTAN) 2 g in sodium chloride 0.9 % 100 mL IVPB     2 g 200 mL/hr over 30 Minutes Intravenous On call to O.R. 03/18/19 1448 03/19/19 1255      Assessment/Plan: Hypertension Diabetes-secondary to TNA/tube feeds Mild malnutrition-treated Gastric ulcers/GERD- PPI   Distal gastric cancer Exploratory laparotomy, distal gastrectomy with Billroth II gastrojejunostomy, portal lymph node dissection, placement of a 24 French gastrostomy tube, placement of feeding jejunostomy tube 03/20/2019, Dr. Greer Pickerel, POD #8 -Pathology: Invasive adenocarcinoma, moderately differentiated spanning 3 cm; 2/20 nodes positive; chronic active gastritis and ulcers Port-A-Cath placement 03/26/19, Dr. Michael Boston; POD#2  Continue G tube to gravity drain Continue tube feeds Still weak.  Not ready for discharge today  Coralie Keens 03/28/2019

## 2019-03-28 NOTE — Progress Notes (Signed)
I concur with previous RN assessment documentation.  

## 2019-03-28 NOTE — Progress Notes (Signed)
OT Note and Discharge from OT  03/28/19 1400  OT Visit Information  Last OT Received On 03/28/19  Assistance Needed +1  History of Present Illness Pt is a 78 year old male s/p ex lap with distal gastrectomy and Billroth II GJ with portal lymph node dissection and placement of a 24FG-tube and separate J-tube, Dr. Redmond Pulling 03/19/19 for Gastric outlet obstruction secondary to adenocarcinoma  Precautions  Precautions Fall  Precaution Comments G tube, J tube and porta a cath  Pain Assessment  Pain Assessment No/denies pain  Cognition  Arousal/Alertness Awake/alert  Behavior During Therapy WFL for tasks assessed/performed  Overall Cognitive Status Within Functional Limits for tasks assessed  ADL  Tub/ Shower Transfer Supervision/safety  General ADL Comments ambulated to gym and performed tub transfer. Pt used grab bars, but also educated that he could place hands on wall to stabilize if needed. He does have a shower seat in the tub.    Restrictions  Weight Bearing Restrictions No  Transfers  Equipment used Rolling walker (2 wheeled)  Sit to Stand Supervision  General transfer comment cues for hand placement. Pt states he sometimes uses RW here and sometimes does not; wanted to use it today  OT - End of Session  Activity Tolerance Patient tolerated treatment well  Patient left in chair;with call bell/phone within reach;with family/visitor present  OT Assessment/Plan  OT Visit Diagnosis Muscle weakness (generalized) (M62.81)  Follow Up Recommendations No OT follow up;Supervision/Assistance - 24 hour  OT Equipment None recommended by OT  AM-PAC OT "6 Clicks" Daily Activity Outcome Measure (Version 2)  Help from another person eating meals? 4  Help from another person taking care of personal grooming? 3  Help from another person toileting, which includes using toliet, bedpan, or urinal? 3  Help from another person bathing (including washing, rinsing, drying)? 2  Help from another person to  put on and taking off regular upper body clothing? 3  Help from another person to put on and taking off regular lower body clothing? 2  6 Click Score 17  OT Goal Progression  Progress towards OT goals Progressing toward goals  OT Time Calculation  OT Start Time (ACUTE ONLY) 1412  OT Stop Time (ACUTE ONLY) 1430  OT Time Calculation (min) 18 min  OT General Charges  $OT Visit 1 Visit  OT Treatments  $Self Care/Home Management  8-22 mins  Lyndel Dancel S, OTR/L Acute Rehabilitation Services 03/28/2019

## 2019-03-28 NOTE — Plan of Care (Signed)

## 2019-03-28 NOTE — Progress Notes (Signed)
Nutrition Follow-up  DOCUMENTATION CODES:   Non-severe (moderate) malnutrition in context of acute illness/injury  INTERVENTION:   -Continue Osmolite 15 @ 60 ml/hr via J-tube (management per surgery) -This goal rate will provide 2160 kcals, 90g protein and 1097 ml H2O.  NUTRITION DIAGNOSIS:   Moderate Malnutrition related to acute illness as evidenced by percent weight loss, moderate muscle depletion, mild fat depletion.  Ongoing.  GOAL:   Patient will meet greater than or equal to 90% of their needs  Meeting with TF.  MONITOR:   Labs, Weight trends, TF tolerance, I & O's  ASSESSMENT:   78 y.o. male with medical history significant of GERD, HTN, hyperglycemia not on any diabetic medication, acoustic neuroma, and BPH. Patient was admitted for persistent N/V and burping x1 month. He reports decreased appetite and 20 lb weight loss in that time frame. CT abdomen showed GOO from a possible mass.  12/4 -EGD 12/8 -EGD  12/10 - Pylorus biopsy showed evidence of gastric adenocarcinoma 12/11 - PICC placed, TPN initiated 12/16 -transferred from ICU to [place], s/p ex lap, distal gastrectomy with Billroth II G-J, G-J tube placement 12/22: TPN weaned off, Port-a-cath placed  **RD working remotely**  TPN is now off. Patient is tolerating tube feeds via J-tube of Osmolite 1.5 @ 60 ml/hr. G-tube is set to gravity. Pt is taking in some clears.  Admission weight: 166 lbs. Current weight: 177 lbs.  I/Os:  +11.9L since admit UOP: 1.2L x 24 hrs G-tube OP: 800 ml x 24 hrs  Medications: IV Zofran PRN Labs reviewed: CBGs: 117-153  Diet Order:   Diet Order            Diet clear liquid Room service appropriate? Yes; Fluid consistency: Thin; Fluid restriction: 1500 mL Fluid  Diet effective now              EDUCATION NEEDS:   Not appropriate for education at this time  Skin:  Skin Assessment: Reviewed RN Assessment  Last BM:  12/23  Height:   Ht Readings from Last 1  Encounters:  03/26/19 5\' 8"  (1.727 m)    Weight:   Wt Readings from Last 1 Encounters:  03/28/19 80.5 kg    Ideal Body Weight:  70 kg  BMI:  Body mass index is 26.98 kg/m.  Estimated Nutritional Needs:   Kcal:  1900-2100  Protein:  95-105g  Fluid:  >/= 2 L/day  Clayton Bibles, MS, RD, LDN Inpatient Clinical Dietitian Pager: [phone] After Hours Pager: [phone]

## 2019-03-29 LAB — GLUCOSE, CAPILLARY
Glucose-Capillary: 121 mg/dL — ABNORMAL HIGH (ref 70–99)
Glucose-Capillary: 122 mg/dL — ABNORMAL HIGH (ref 70–99)
Glucose-Capillary: 137 mg/dL — ABNORMAL HIGH (ref 70–99)
Glucose-Capillary: 139 mg/dL — ABNORMAL HIGH (ref 70–99)
Glucose-Capillary: 156 mg/dL — ABNORMAL HIGH (ref 70–99)
Glucose-Capillary: 156 mg/dL — ABNORMAL HIGH (ref 70–99)
Glucose-Capillary: 181 mg/dL — ABNORMAL HIGH (ref 70–99)
Glucose-Capillary: 189 mg/dL — ABNORMAL HIGH (ref 70–99)
Glucose-Capillary: 198 mg/dL — ABNORMAL HIGH (ref 70–99)
Glucose-Capillary: 81 mg/dL (ref 70–99)
Glucose-Capillary: 93 mg/dL (ref 70–99)

## 2019-03-29 LAB — BASIC METABOLIC PANEL
Anion gap: 8 (ref 5–15)
BUN: 26 mg/dL — ABNORMAL HIGH (ref 8–23)
CO2: 30 mmol/L (ref 22–32)
Calcium: 8.7 mg/dL — ABNORMAL LOW (ref 8.9–10.3)
Chloride: 97 mmol/L — ABNORMAL LOW (ref 98–111)
Creatinine, Ser: 0.88 mg/dL (ref 0.61–1.24)
GFR calc Af Amer: >60 mL/min (ref >60)
GFR calc non Af Amer: >60 mL/min (ref >60)
Glucose, Bld: 144 mg/dL — ABNORMAL HIGH (ref 70–99)
Potassium: 4.1 mmol/L (ref 3.5–5.1)
Sodium: 135 mmol/L (ref 135–145)

## 2019-03-29 LAB — CBC
HCT: 33.4 % — ABNORMAL LOW (ref 39.0–52.0)
Hemoglobin: 10.6 g/dL — ABNORMAL LOW (ref 13.0–17.0)
MCH: 28 pg (ref 26.0–34.0)
MCHC: 31.7 g/dL (ref 30.0–36.0)
MCV: 88.1 fL (ref 80.0–100.0)
Platelets: 289 10*3/uL (ref 150–400)
RBC: 3.79 MIL/uL — ABNORMAL LOW (ref 4.22–5.81)
RDW: 12.5 % (ref 11.5–15.5)
WBC: 5.4 10*3/uL (ref 4.0–10.5)
nRBC: 0 % (ref 0.0–0.2)

## 2019-03-29 MED ORDER — SODIUM BICARBONATE 650 MG PO TABS
650.0000 mg | ORAL_TABLET | Freq: Once | ORAL | Status: AC
  Administered 2019-03-29: 650 mg
  Filled 2019-03-29: qty 1

## 2019-03-29 MED ORDER — PANCRELIPASE (LIP-PROT-AMYL) 10440-39150 UNITS PO TABS
20880.0000 [IU] | ORAL_TABLET | Freq: Once | ORAL | Status: AC
  Administered 2019-03-29: 20880 [IU]
  Filled 2019-03-29: qty 2

## 2019-03-29 NOTE — Progress Notes (Signed)
RN attempted to unclogged J tube with unclogging protocol. Attempt was unsuccessful.

## 2019-03-29 NOTE — Progress Notes (Signed)
3 Days Post-Op   Subjective/Chief Complaint: No complaints Tolerating tube feeds Moderate drainage from g-tube   Objective: Vital signs in last 24 hours: Temp:  [98.1 F (36.7 C)-98.3 F (36.8 C)] 98.1 F (36.7 C) (12/25 0529) Pulse Rate:  [81-87] 87 (12/25 0529) Resp:  [14-16] 14 (12/25 0529) BP: (104-123)/(69-76) 123/71 (12/25 0529) SpO2:  [100 %] 100 % (12/25 0529) Weight:  [75.8 kg] 75.8 kg (12/25 0529) Last BM Date: 03/28/19  Intake/Output from previous day: 12/24 0701 - 12/25 0700 In: 2423.8 [P.O.:180; I.V.:499; NG/GT:1584.8] Out: 2300 [Urine:400; Drains:1900] Intake/Output this shift: Total I/O In: -  Out: 150 [Drains:150]  Exam: Awake and alert Abdomen soft, incision clean G-tube with bile  Lab Results:  Recent Labs    03/29/19 0358  WBC 5.4  HGB 10.6*  HCT 33.4*  PLT 289   BMET Recent Labs    03/29/19 0358  NA 135  K 4.1  CL 97*  CO2 30  GLUCOSE 144*  BUN 26*  CREATININE 0.88  CALCIUM 8.7*   PT/INR No results for input(s): LABPROT, INR in the last 72 hours. ABG No results for input(s): PHART, HCO3 in the last 72 hours.  Invalid input(s): PCO2, PO2  Studies/Results: No results found.  Anti-infectives: Anti-infectives (From admission, onward)   Start     Dose/Rate Route Frequency Ordered Stop   03/26/19 1015  ceFAZolin (ANCEF) IVPB 2g/100 mL premix     2 g 200 mL/hr over 30 Minutes Intravenous On call to O.R. 03/26/19 1002 03/26/19 1533   03/19/19 0600  cefoTEtan (CEFOTAN) 2 g in sodium chloride 0.9 % 100 mL IVPB     2 g 200 mL/hr over 30 Minutes Intravenous On call to O.R. 03/18/19 1448 03/19/19 1255      Assessment/Plan: s/p Procedure(s): INSERTION PORT-A-CATH (N/A)  Hypertension Diabetes-secondary to TNA/tube feeds Mild malnutrition-treated Gastric ulcers/GERD- PPI Distal gastric cancer Exploratory laparotomy, distal gastrectomy with Billroth II gastrojejunostomy, portal lymph node dissection, placement of a 24  French gastrostomy tube, placement of feeding jejunostomy tube 03/20/2019  He is doing well Issue is home health arrangements for home tube feeds, G-tube care Plan home tomorrow if this can be arranged  Coralie Keens 03/29/2019

## 2019-03-29 NOTE — Anesthesia Postprocedure Evaluation (Signed)
Anesthesia Post Note  Patient: Jonathan Lewis  Procedure(s) Performed: INSERTION PORT-A-CATH (N/A )     Patient location during evaluation: PACU Anesthesia Type: General Level of consciousness: awake and alert Pain management: pain level controlled Vital Signs Assessment: post-procedure vital signs reviewed and stable Respiratory status: spontaneous breathing, nonlabored ventilation and respiratory function stable Cardiovascular status: blood pressure returned to baseline and stable Postop Assessment: no apparent nausea or vomiting Anesthetic complications: no    Last Vitals:  Vitals:   03/28/19 2205 03/29/19 0529  BP: 104/69 123/71  Pulse: 81 87  Resp: 14 14  Temp: 36.7 C 36.7 C  SpO2: 100% 100%    Last Pain:  Vitals:   03/29/19 0529  TempSrc: Oral  PainSc:    Pain Goal: Patients Stated Pain Goal: 3 (03/26/19 1344)                 Lynda Rainwater

## 2019-03-29 NOTE — Progress Notes (Signed)
Patients J tube is clogged. MD aware. IR is not available today. Attempted to flush. Stopped feedings and will continue to monitor.

## 2019-03-30 LAB — GLUCOSE, CAPILLARY
Glucose-Capillary: 102 mg/dL — ABNORMAL HIGH (ref 70–99)
Glucose-Capillary: 103 mg/dL — ABNORMAL HIGH (ref 70–99)
Glucose-Capillary: 125 mg/dL — ABNORMAL HIGH (ref 70–99)
Glucose-Capillary: 160 mg/dL — ABNORMAL HIGH (ref 70–99)

## 2019-03-30 NOTE — Progress Notes (Signed)
4 Days Post-Op   Subjective/Chief Complaint: No complaints  J-tube became clogged yesterday and could not be unclogged by nursing   Objective: Vital signs in last 24 hours: Temp:  [97.7 F (36.5 C)-98.4 F (36.9 C)] 97.7 F (36.5 C) (12/26 0605) Pulse Rate:  [82-86] 86 (12/26 0605) Resp:  [16] 16 (12/26 0605) BP: (106-116)/(66-75) 106/70 (12/26 0605) SpO2:  [98 %-100 %] 100 % (12/26 0605) Weight:  [75.6 kg] 75.6 kg (12/26 0605) Last BM Date: 03/29/19  Intake/Output from previous day: 12/25 0701 - 12/26 0700 In: 554 [P.O.:120; I.V.:434] Out: 1900 [Urine:100; Drains:1800] Intake/Output this shift: Total I/O In: 71.3 [I.V.:71.3] Out: 0   Exam: Awake and alert Abdomen soft, I was able to unclog j-tube and then was able to inject saline through it. g-tube with bile  Lab Results:  Recent Labs    03/29/19 0358  WBC 5.4  HGB 10.6*  HCT 33.4*  PLT 289   BMET Recent Labs    03/29/19 0358  NA 135  K 4.1  CL 97*  CO2 30  GLUCOSE 144*  BUN 26*  CREATININE 0.88  CALCIUM 8.7*   PT/INR No results for input(s): LABPROT, INR in the last 72 hours. ABG No results for input(s): PHART, HCO3 in the last 72 hours.  Invalid input(s): PCO2, PO2  Studies/Results: No results found.  Anti-infectives: Anti-infectives (From admission, onward)   Start     Dose/Rate Route Frequency Ordered Stop   03/26/19 1015  ceFAZolin (ANCEF) IVPB 2g/100 mL premix     2 g 200 mL/hr over 30 Minutes Intravenous On call to O.R. 03/26/19 1002 03/26/19 1533   03/19/19 0600  cefoTEtan (CEFOTAN) 2 g in sodium chloride 0.9 % 100 mL IVPB     2 g 200 mL/hr over 30 Minutes Intravenous On call to O.R. 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  If tube becomes clogged again, will have to have IR consult to see if they can get it open as he will require tube feeds at home.  Will need to keep him here until at least tomorrow now.  Will clamp g-tube to see if he can tolerate this   LOS: 23 days    Coralie Keens 03/30/2019

## 2019-03-30 NOTE — TOC Progression Note (Signed)
Transition of Care [facility]) - Progression Note    Patient Details  Name: Jonathan Lewis MRN: [id number] Date of Birth: July 22, 1940  [facility]) CM/SW Contact  Leeroy Cha, RN Phone Number: 03/30/2019, 8:35 AM  Clinical Narrative:    hhc [facility]   Expected Discharge Plan: [place] Barriers to Discharge: Continued Medical Work up  Expected Discharge Plan and Services Expected Discharge Plan: [place]   Discharge Planning Services: CM Consult Post Acute Care Choice: [place] arrangements for the past 2 months: Single Family Home                           HH Arranged: RN, PT [place] Agency: [facility] [place] Date [place]: 03/25/19 Time [place]: [place] Representative spoke with at [place]: amy hyatt   Social Determinants of Health ([place]) Interventions    Readmission Risk Interventions Readmission Risk Prevention Plan 03/20/2019  Transportation Screening Complete  PCP or Specialist Appt within 5-7 Days Complete  Home Care Screening Complete  Medication Review (RN CM) Complete  Some recent data might be hidden

## 2019-03-31 LAB — GLUCOSE, CAPILLARY
Glucose-Capillary: 125 mg/dL — ABNORMAL HIGH (ref 70–99)
Glucose-Capillary: 132 mg/dL — ABNORMAL HIGH (ref 70–99)
Glucose-Capillary: 142 mg/dL — ABNORMAL HIGH (ref 70–99)
Glucose-Capillary: 150 mg/dL — ABNORMAL HIGH (ref 70–99)

## 2019-03-31 NOTE — Progress Notes (Signed)
[place] [facility] Office:  [phone] General Surgery Progress Note   LOS: 24 days  POD -  5 Days Post-Op  Assessment and Plan: 1.  Exploratory laparotomy, distal gastrectomy with Billroth II gastrojejunostomy, portal lymph node dissection, placement of a 24 French gastrostomy tube, placement of feeding jejunostomy tube 03/20/2019 - [place]  For Distal gastric cancer  On tube feedings.  On clear liquids - will advance to full liquids.  If this can be tolerated, he may be able to go home not needing TF  2.  INSERTION PORT-A-CATH - 03/26/2019 - [place]  3.  Hypertension 4.  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  Mild malnutrition-treated 6.  Gastric ulcers/GERD- PPI   Principal Problem:   Gastric cancer pT3pN2 s/p distal gastrectomy with Billroth II gastrojejunostomy 03/19/2019 Active Problems:   Diabetes ([place])   Hyperlipidemia   Hypertension complicating diabetes ([place])   Benign nodular prostatic hyperplasia   LOW BACK PAIN, CHRONIC   BPH (benign prostatic hyperplasia)   Bilateral leg paresthesia   Gastric outlet obstruction   Malnutrition of moderate degree   H/O exploratory laparotomy  Subjective:  Doing well.  On clear liquids.  No nausea.  Objective:   Vitals:   03/30/19 2137 03/31/19 0541  BP: 107/76 107/76  Pulse: 86 87  Resp: 16 16  Temp: 98.1 F (36.7 C) 98.2 F (36.8 C)  SpO2: 97% 100%     Intake/Output from previous day:  12/26 0701 - 12/27 0700 In: 1876.7 [I.V.:298.7; NG/GT:1578] Out: 670 [Urine:270; Drains:400]  Intake/Output this shift:  Total I/O In: 120 [P.O.:120] Out: -    Physical Exam:   General: WN AA M who is alert and oriented.    HEENT: Normal. Pupils equal. .   Lungs: Clear   Abdomen: Soft   Wound: Midline wound.  Gastrostomy tube and J tube.     Lab Results:    Recent Labs    03/29/19 0358  WBC 5.4  HGB 10.6*  HCT 33.4*  PLT 289    BMET   Recent Labs    03/29/19 0358  NA 135  K 4.1  CL 97*  CO2 30   GLUCOSE 144*  BUN 26*  CREATININE 0.88  CALCIUM 8.7*    PT/INR  No results for input(s): LABPROT, INR in the last 72 hours.  ABG  No results for input(s): PHART, HCO3 in the last 72 hours.  Invalid input(s): PCO2, PO2   Studies/Results:  No results found.   Anti-infectives:   Anti-infectives (From admission, onward)   Start     Dose/Rate Route Frequency Ordered Stop   03/26/19 1015  ceFAZolin (ANCEF) IVPB 2g/100 mL premix     2 g 200 mL/hr over 30 Minutes Intravenous On call to O.R. 03/26/19 1002 03/26/19 1533   03/19/19 0600  cefoTEtan (CEFOTAN) 2 g in sodium chloride 0.9 % 100 mL IVPB     2 g 200 mL/hr over 30 Minutes Intravenous On call to O.R. 03/18/19 1448 03/19/19 [place], MD, [facility] Surgery Office: [phone] 03/31/2019

## 2019-04-01 ENCOUNTER — Inpatient Hospital Stay (HOSPITAL_COMMUNITY): Payer: Medicare Other

## 2019-04-01 ENCOUNTER — Encounter: Payer: Self-pay | Admitting: *Deleted

## 2019-04-01 LAB — GLUCOSE, CAPILLARY
Glucose-Capillary: 141 mg/dL — ABNORMAL HIGH (ref 70–99)
Glucose-Capillary: 97 mg/dL (ref 70–99)
Glucose-Capillary: 119 mg/dL — ABNORMAL HIGH (ref 70–99)

## 2019-04-01 MED ORDER — IOHEXOL 300 MG/ML  SOLN
50.0000 mL | Freq: Once | INTRAMUSCULAR | Status: AC | PRN
  Administered 2019-04-01: 50 mL via ORAL

## 2019-04-01 NOTE — Progress Notes (Signed)
Nutrition Follow-up  DOCUMENTATION CODES:   Non-severe (moderate) malnutrition in context of acute illness/injury  INTERVENTION:   -Recommend switching TF formula to Glucerna 1.2 @ 65 ml/hr via J-tube. -Recommend free water flushes of 200 ml TID. -Please flush tube to prevent tube clogging. -This regimen provides 1872 kcals, 93g protein and 1855 ml H2O. 178g CHO daily.  NUTRITION DIAGNOSIS:   Moderate Malnutrition related to acute illness as evidenced by percent weight loss, moderate muscle depletion, mild fat depletion.  Ongoing.  GOAL:   Patient will meet greater than or equal to 90% of their needs  Meeting with TF.  MONITOR:   Labs, Weight trends, TF tolerance, I & O's  REASON FOR ASSESSMENT:   Consult Assessment of nutrition requirement/status  ASSESSMENT:   78 y.o. male with medical history significant of GERD, HTN, hyperglycemia not on any diabetic medication, acoustic neuroma, and BPH. Patient was admitted for persistent N/V and burping x1 month. He reports decreased appetite and 20 lb weight loss in that time frame. CT abdomen showed GOO from a possible mass.  12/4 -EGD 12/8 -EGD  12/10 - Pylorus biopsy showed evidence of gastric adenocarcinoma 12/11 - PICC placed, TPN initiated 12/16 -transferred from ICU to [place], s/p ex lap, distal gastrectomy with Billroth II G-J, G-J tube placement 12/22: TPN weaned off, Port-a-cath placed  RD consulted for TF recommendations. Pt with new onset diabetes. CBGs have ranged: 97-141 today. Pt is receiving insulin. Pt on clear liquids.   Pt's tube did become clogged 12/25. Now J-tube working. G-tube to drainage. Osmolite 1.5 @ 60 ml/hr provides 293g of CHO daily. Surgery managing TFs this admission.  For less daily carbohydrates, recommend Glucerna 1.2 @ 65 ml/hr via J-tube (provides 178g CHO daily).   Admission weight: 166 lbs. Current weight: 162 lbs.  I/Os: +699 ml since 12/14 UOP: 250 ml x 24 hrs G-tube OP: 2050 ml x 24  hrs  Medications reviewed. Labs reviewed: CBGs:97-141  Diet Order:   Diet Order            Diet clear liquid Room service appropriate? Yes; Fluid consistency: Thin  Diet effective now              EDUCATION NEEDS:   Not appropriate for education at this time  Skin:  Skin Assessment: Reviewed RN Assessment  Last BM:  12/27  Height:   Ht Readings from Last 1 Encounters:  03/26/19 5\' 8"  (1.727 m)    Weight:   Wt Readings from Last 1 Encounters:  03/31/19 73.8 kg    Ideal Body Weight:  70 kg  BMI:  Body mass index is 24.74 kg/m.  Estimated Nutritional Needs:   Kcal:  1850-2050  Protein:  90-100g  Fluid:  >/= 2 L/day  Clayton Bibles, MS, RD, LDN Inpatient Clinical Dietitian Pager: [phone] After Hours Pager: [phone]

## 2019-04-01 NOTE — Progress Notes (Signed)
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 Edwena Blow. Burney Gauze  DISPO: UGI pending, may need to go home on TF's and G tube to gravity. Dietitian consult to switch tube feeds 2/2 diabetes    LOS: 25 days    Subjective: CC: no complaints  Pt denies nausea prior to G tube being placed to gravity. No vomiting. No abdominal pain. Had a BM 2 days ago. Having flatus.   Objective: Vital signs in last 24 hours: Temp:  [97.7 F (36.5 C)-98.3 F (36.8 C)] 98.3 F (36.8 C) (12/28 0542) Pulse Rate:  [74-82] 77 (12/28 0542) Resp:  [16-18] 18 (12/28 0542) BP: (96-115)/(67-82) 103/67 (12/28 0542) SpO2:  [100 %] 100 % (12/28 0542) Last BM Date: 03/31/19  Intake/Output from previous day: 12/27 0701 - 12/28 0700 In: 1560 [P.O.:600; I.V.:240; NG/GT:720] Out: 3500 [Urine:1450; Drains:2050] Intake/Output this shift: No intake/output data recorded.  PE:  Gen:   Alert, NAD, pleasant, cooperative Card:  RRR, no M/G/R heard Pulm:  CTA, no W/R/R, effort normal Abd: Soft, NT/ND, +BS, midline incision is well healing and without signs of infection, G tube and J tube sites are clean and are without signs of infection. G tube output is bilious.  Skin: no rashes noted, warm and dry   Anti-infectives: Anti-infectives (From admission, onward)   Start     Dose/Rate Route Frequency Ordered Stop   03/26/19 1015  ceFAZolin (ANCEF) IVPB 2g/100 mL premix     2 g 200 mL/hr over 30 Minutes Intravenous On call to O.R. 03/26/19 1002 03/26/19 1533   03/19/19 0600  cefoTEtan (CEFOTAN) 2 g in sodium chloride 0.9 % 100 mL IVPB     2 g 200 mL/hr over 30 Minutes Intravenous On call to O.R. 03/18/19 1448 03/19/19 1255      Lab Results:  No results for input(s): WBC, HGB, HCT, PLT in the last 72 hours. BMET No results for input(s): NA, K, CL, CO2, GLUCOSE, BUN, CREATININE, CALCIUM in the last 72 hours. PT/INR No results for input(s): LABPROT, INR in the last 72 hours. CMP     Component Value Date/Time   NA 135 03/29/2019 0358   K 4.1 03/29/2019 0358   CL 97 (L) 03/29/2019 0358   CO2 30 03/29/2019 0358   GLUCOSE 144 (H) 03/29/2019 0358   BUN 26 (H) 03/29/2019 0358   CREATININE 0.88 03/29/2019 0358   CREATININE 1.06 07/16/2012 1038   CALCIUM 8.7 (L) 03/29/2019 0358   PROT 6.0 (L) 03/25/2019 0331   ALBUMIN 3.1 (L) 03/25/2019 BP:[id number]  AST 46 (H) 03/25/2019 0331   ALT 77 (H) 03/25/2019 0331   ALKPHOS 101 03/25/2019 0331   BILITOT 0.9 03/25/2019 0331   GFRNONAA >60 03/29/2019 0358   GFRAA >60 03/29/2019 0358   Lipase     Component Value Date/Time   LIPASE 30 03/07/2019 1000    Studies/Results: No results found.   Kalman Drape, PA-C [facility] Please see amion for pager for the following: Myna Hidalgo, W, & Friday 7:00am - 4:30pm Thursdays 7:00am -11:30am

## 2019-04-01 NOTE — Progress Notes (Signed)
Faxed [employer] paperwork to [phone]. Received faxed confirmation. Paperwork placed behind Dr. Antonieta Pert desk nurse drawer.

## 2019-04-01 NOTE — Progress Notes (Signed)
Jonathan Lewis looks pretty good.  Hopefully, he will be going home soon.  He had his surgery on December 16.  He has a gastrostomy tube.  He underwent a distal gastrectomy.  He has stage III disease.  I think he has 4 lymph nodes that are positive.  He would be a candidate for adjuvant therapy.  Because of the positive lymph nodes, I think that we have to put in radiation therapy also.  He already has a Port-A-Cath placed.  I must say that surgery did a fantastic job with this.  I would consider him for a "sandwich" approach with treatment.  I would give him 4 cycles of FOLFOX followed by radiation with Xeloda, followed by 4 additional cycles of FOLFOX.  We probably can get started after the new year.  He is doing okay with the tube feeds.  He is having some full liquids and doing okay with this.  He has not had a bowel movement yet.  There is no fever.  He has had no bleeding.  He is out of bed.  I must say that his overall performance status continues to be quite good.  He really has a good strong constitution.  We will follow up as an outpatient.  I will try to get adjuvant chemotherapy started probably the second week in January.  This should be enough time for him to recover.  Hopefully, he will be able to take in full liquids.  I would like to hope that at some point the gastric tube might be able to come out.  It is no surprise that he is gotten fantastic care from all the staff up on [address].  Lattie Haw, MD  Psalm 143:8

## 2019-04-02 ENCOUNTER — Encounter: Payer: Self-pay | Admitting: Internal Medicine

## 2019-04-02 LAB — BASIC METABOLIC PANEL
Anion gap: 8 (ref 5–15)
BUN: 32 mg/dL — ABNORMAL HIGH (ref 8–23)
CO2: 33 mmol/L — ABNORMAL HIGH (ref 22–32)
Calcium: 8.8 mg/dL — ABNORMAL LOW (ref 8.9–10.3)
Chloride: 99 mmol/L (ref 98–111)
Creatinine, Ser: 0.93 mg/dL (ref 0.61–1.24)
GFR calc Af Amer: >60 mL/min (ref >60)
GFR calc non Af Amer: >60 mL/min (ref >60)
Glucose, Bld: 124 mg/dL — ABNORMAL HIGH (ref 70–99)
Potassium: 3.9 mmol/L (ref 3.5–5.1)
Sodium: 140 mmol/L (ref 135–145)

## 2019-04-02 LAB — GLUCOSE, CAPILLARY
Glucose-Capillary: 115 mg/dL — ABNORMAL HIGH (ref 70–99)
Glucose-Capillary: 130 mg/dL — ABNORMAL HIGH (ref 70–99)
Glucose-Capillary: 144 mg/dL — ABNORMAL HIGH (ref 70–99)
Glucose-Capillary: 146 mg/dL — ABNORMAL HIGH (ref 70–99)
Glucose-Capillary: 154 mg/dL — ABNORMAL HIGH (ref 70–99)
Glucose-Capillary: 84 mg/dL (ref 70–99)

## 2019-04-02 NOTE — Progress Notes (Signed)
7 Days Post-Op   Subjective/Chief Complaint: No complaints   Objective: Vital signs in last 24 hours: Temp:  [97.6 F (36.4 C)-99.1 F (37.3 C)] 97.6 F (36.4 C) (12/29 0540) Pulse Rate:  [65-78] 78 (12/29 0540) Resp:  [16] 16 (12/29 0540) BP: (100-112)/(68-81) 100/68 (12/29 0540) SpO2:  [93 %-100 %] 100 % (12/29 0540) Weight:  [74.9 kg] 74.9 kg (12/29 0540) Last BM Date: 04/02/19  Intake/Output from previous day: 12/28 0701 - 12/29 0700 In: 1435 [P.O.:120; I.V.:287.5; NG/GT:1027.5] Out: 1600 [Drains:1600] Intake/Output this shift: No intake/output data recorded.  General appearance: alert and cooperative Resp: clear to auscultation bilaterally Cardio: regular rate and rhythm GI: soft, nontender. g tube to drain. tolerating j tube feeds  Lab Results:  No results for input(s): WBC, HGB, HCT, PLT in the last 72 hours. BMET No results for input(s): NA, K, CL, CO2, GLUCOSE, BUN, CREATININE, CALCIUM in the last 72 hours. PT/INR No results for input(s): LABPROT, INR in the last 72 hours. ABG No results for input(s): PHART, HCO3 in the last 72 hours.  Invalid input(s): PCO2, PO2  Studies/Results: DG Abd Portable 1V  Result Date: 04/01/2019 CLINICAL DATA:  Follow-up upper GI EXAM: PORTABLE ABDOMEN - 1 VIEW COMPARISON:  Upper GI from earlier in the same day. FINDINGS: Gastrostomy catheter is noted in place. A minimal amount of contrast material is noted within the stomach. This is significantly decreased when compared with the last film of the upper GI. Surgical drain is noted in place. No other focal abnormality is noted. IMPRESSION: Gastrostomy catheter is noted in place. The amount of contrast within the stomach has decreased significantly when compared with the prior exam likely related to passage of contrast material distally. No other focal abnormality is noted. Electronically Signed   By: Inez Catalina M.D.   On: 04/01/2019 16:28   DG UGI W SINGLE CM (SOL OR THIN  BA)  Result Date: 04/01/2019 CLINICAL DATA:  Status post distal gastrectomy with gastrojejunostomy. EXAM: WATER SOLUBLE UPPER GI SERIES TECHNIQUE: Single-column upper GI series was performed using water soluble contrast. CONTRAST:  82mL OMNIPAQUE IOHEXOL 300 MG/ML  SOLN COMPARISON:  03/06/2019 FLUOROSCOPY TIME:  Fluoroscopy Time:  2 minutes 36 seconds Radiation Exposure Index (if provided by the fluoroscopic device): 58.3 mGy Number of Acquired Spot Images: 6 FINDINGS: Imaging was initially performed with the patient in the semi upright orientation. 50 cc of Omnipaque 300 were ingested by the patient. This easily passed through the esophagus and into the remaining portions of the stomach. On subsequent imaging contrast failed to pass beyond the gastrojejunostomy site. Multiple maneuvers including right lateral and upright orientations were performed in an attempt to pass contrast into the proximal small bowel loops without success. No extravasation of contrast material identified from the stomach. IMPRESSION: 1. Satisfactory opacification of the gastric lumen without evidence for contrast extravasation to suggest leakage. 2. Lack of contrast passage through gastrojejunostomy. Findings may reflect postoperative gastroparesis versus obstruction at the gastrojejunostomy site. A 1 hour delayed plain film radiograph of the abdomen will be obtained to assess for emptying of the gastric lumen. Electronically Signed   By: Kerby Moors M.D.   On: 04/01/2019 15:48    Anti-infectives: Anti-infectives (From admission, onward)   Start     Dose/Rate Route Frequency Ordered Stop   03/26/19 1015  ceFAZolin (ANCEF) IVPB 2g/100 mL premix     2 g 200 mL/hr over 30 Minutes Intravenous On call to O.R. 03/26/19 1002 03/26/19 1533   03/19/19  0600  cefoTEtan (CEFOTAN) 2 g in sodium chloride 0.9 % 100 mL IVPB     2 g 200 mL/hr over 30 Minutes Intravenous On call to O.R. 03/18/19 1448 03/19/19 1255       Assessment/Plan: s/p Procedure(s): INSERTION PORT-A-CATH (N/A) allow clears while g tube is to drain for comfort  Active Problems: Diabetes ([place]) Hyperlipidemia - holding home statin  Hypertension complicating diabetes (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 Edwena Blow. Burney Gauze  DISPO: UGI neg for leak.  may need to go home on TF's and G tube to gravity. Dietitian consult to switch tube feeds 2/2 diabetes  LOS: 26 days    Autumn Messing III 04/02/2019

## 2019-04-03 ENCOUNTER — Inpatient Hospital Stay (HOSPITAL_COMMUNITY): Payer: Medicare Other

## 2019-04-03 ENCOUNTER — Encounter: Payer: Self-pay | Admitting: Radiology

## 2019-04-03 HISTORY — PX: IR PATIENT EVAL TECH 0-60 MINS: IMG5564

## 2019-04-03 LAB — GLUCOSE, CAPILLARY
Glucose-Capillary: 122 mg/dL — ABNORMAL HIGH (ref 70–99)
Glucose-Capillary: 111 mg/dL — ABNORMAL HIGH (ref 70–99)
Glucose-Capillary: 87 mg/dL (ref 70–99)
Glucose-Capillary: 90 mg/dL (ref 70–99)

## 2019-04-03 LAB — BASIC METABOLIC PANEL
Anion gap: 9 (ref 5–15)
BUN: 36 mg/dL — ABNORMAL HIGH (ref 8–23)
CO2: 32 mmol/L (ref 22–32)
Calcium: 9 mg/dL (ref 8.9–10.3)
Chloride: 99 mmol/L (ref 98–111)
Creatinine, Ser: 0.95 mg/dL (ref 0.61–1.24)
GFR calc Af Amer: >60 mL/min (ref >60)
GFR calc non Af Amer: >60 mL/min (ref >60)
Glucose, Bld: 216 mg/dL — ABNORMAL HIGH (ref 70–99)
Potassium: 4 mmol/L (ref 3.5–5.1)
Sodium: 140 mmol/L (ref 135–145)

## 2019-04-03 MED ORDER — FREE WATER
200.0000 mL | Freq: Three times a day (TID) | Status: DC
  Administered 2019-04-03 – 2019-04-04 (×2): 200 mL

## 2019-04-03 MED ORDER — SODIUM CHLORIDE 0.9 % IV BOLUS
1000.0000 mL | Freq: Once | INTRAVENOUS | Status: AC
  Administered 2019-04-03: 1000 mL via INTRAVENOUS

## 2019-04-03 NOTE — Procedures (Signed)
Patient arrived in IR with a clogged surgically placed J tube.  A g/j tube 14/16 declogger was able to be advance to the j tube end without resistance.  A short Amplatz wire was also inserted without resistance until the catheter end.  A 10 cc syringe of saline with a catheter adapter was inserted. The catheter was still clogged.   Firm pressure was used with the flush catheter without allowing the J tube to leak or swell.  This pressure was gentle maintained for 60-90 seconds until the debris in the catheter was dislodged.   The j tube was easily flushed with 50 cc saline.  [facility] assessed the catheter and verified that it was ready to use

## 2019-04-03 NOTE — Progress Notes (Addendum)
[facility]/Trauma Progress Note  8 Days Post-Op   Assessment/Plan Active Problems: Diabetes ([place]) Hyperlipidemia- holding home statin Hypertension complicating diabetes ([place]) Benign nodular prostatic hyperplasia- good urine output LOW BACK PAIN, CHRONIC BPH (benign prostatic hyperplasia) Bilateral leg paresthesia Gastric outlet obstruction Malnutrition of moderate degree  Hypertension- well controlled, meds PRN Diabetes-secondary to TNA/tube feeds, SSI, dietician consult to switch tube feeds Mild malnutrition-treated Gastric ulcers/GERD- PPI  Gastric cancer pT3pN2 -s/p Exploratory laparotomy, distal gastrectomy with Billroth II gastrojejunostomy, portal lymph node dissection, placement of a 24 French gastrostomy tube, placement of feeding jejunostomy tube 03/20/2019- Wilson - S/PINSERTION PORT-A-CATH- 03/26/2019 - Gross - G tube to gravity  - UGI neg for leak but showed no passage through gastrojejunostomy  [id number] feedings@60cc /hr/clear liquids for comfort/G tube to gravity VTE: SCD's, lovenox [id number] currently Foley:none Follow up:Dr. Wilson/Dr. Burney Gauze  DISPO:will need to go home on TF's and G tube to gravity. J tube clogged today and IR consult pending to unclog it. Awaiting HH for tube feeds to be set up. Hopefully home later today vs tomorrow.    LOS: 27 days    Subjective: CC: no complaints  Pt denies abdominal pain, nausea or vomiting. Having BM's. Small amt of blood this am with wiping after BM. Patient wants to go home.   Objective: Vital signs in last 24 hours: Temp:  [97.5 F (36.4 C)-98.3 F (36.8 C)] 98.3 F (36.8 C) (12/30 0623) Pulse Rate:  [79-84] 79 (12/30 0623) Resp:  [17-18] 18 (12/30 0623) BP: (98-115)/(71-79) 102/71 (12/30 0623) SpO2:  [100 %] 100 % (12/30 0623) Last BM Date: 04/02/19  Intake/Output from previous day: 12/29 0701 - 12/30 0700 In: 4325 [P.O.:1880; I.V.:240;  NG/GT:2205] Out: 2800 [Urine:150; Drains:2650] Intake/Output this shift: Total I/O In: 240 [P.O.:240] Out: 500 [Urine:200; Drains:300]  PE:  Gen:  Alert, NAD, pleasant, cooperative Card:  RRR, no M/G/R heard Pulm:  CTA, no W/R/R, rate and effort normal Abd: Soft, NT/ND, +BS, midline incision is well healing and without signs of infection, G tube and J tube sites are clean and are without signs of infection. G tube output is bilious.  Skin: no rashes noted, warm and dry   Anti-infectives: Anti-infectives (From admission, onward)   Start     Dose/Rate Route Frequency Ordered Stop   03/26/19 1015  ceFAZolin (ANCEF) IVPB 2g/100 mL premix     2 g 200 mL/hr over 30 Minutes Intravenous On call to O.R. 03/26/19 1002 03/26/19 1533   03/19/19 0600  cefoTEtan (CEFOTAN) 2 g in sodium chloride 0.9 % 100 mL IVPB     2 g 200 mL/hr over 30 Minutes Intravenous On call to O.R. 03/18/19 1448 03/19/19 1255      Lab Results:  No results for input(s): WBC, HGB, HCT, PLT in the last 72 hours. BMET Recent Labs    04/02/19 1023 04/03/19 0959  NA 140 140  K 3.9 4.0  CL 99 99  CO2 33* 32  GLUCOSE 124* 216*  BUN 32* 36*  CREATININE 0.93 0.95  CALCIUM 8.8* 9.0   PT/INR No results for input(s): LABPROT, INR in the last 72 hours. CMP     Component Value Date/Time   NA 140 04/03/2019 0959   K 4.0 04/03/2019 0959   CL 99 04/03/2019 0959   CO2 32 04/03/2019 0959   GLUCOSE 216 (H) 04/03/2019 0959   BUN 36 (H) 04/03/2019 0959   CREATININE 0.95 04/03/2019 0959   CREATININE 1.06 07/16/2012 1038   CALCIUM 9.0 04/03/2019  0959   PROT 6.0 (L) 03/25/2019 0331   ALBUMIN 3.1 (L) 03/25/2019 0331   AST 46 (H) 03/25/2019 0331   ALT 77 (H) 03/25/2019 0331   ALKPHOS 101 03/25/2019 0331   BILITOT 0.9 03/25/2019 0331   GFRNONAA >60 04/03/2019 0959   GFRAA >60 04/03/2019 0959   Lipase     Component Value Date/Time   LIPASE 30 03/07/2019 1000    Studies/Results: DG Abd Portable 1V  Result Date:  04/01/2019 CLINICAL DATA:  Follow-up upper GI EXAM: PORTABLE ABDOMEN - 1 VIEW COMPARISON:  Upper GI from earlier in the same day. FINDINGS: Gastrostomy catheter is noted in place. A minimal amount of contrast material is noted within the stomach. This is significantly decreased when compared with the last film of the upper GI. Surgical drain is noted in place. No other focal abnormality is noted. IMPRESSION: Gastrostomy catheter is noted in place. The amount of contrast within the stomach has decreased significantly when compared with the prior exam likely related to passage of contrast material distally. No other focal abnormality is noted. Electronically Signed   By: Inez Catalina M.D.   On: 04/01/2019 16:28   DG UGI W SINGLE CM (SOL OR THIN BA)  Result Date: 04/01/2019 CLINICAL DATA:  Status post distal gastrectomy with gastrojejunostomy. EXAM: WATER SOLUBLE UPPER GI SERIES TECHNIQUE: Single-column upper GI series was performed using water soluble contrast. CONTRAST:  9mL OMNIPAQUE IOHEXOL 300 MG/ML  SOLN COMPARISON:  03/06/2019 FLUOROSCOPY TIME:  Fluoroscopy Time:  2 minutes 36 seconds Radiation Exposure Index (if provided by the fluoroscopic device): 58.3 mGy Number of Acquired Spot Images: 6 FINDINGS: Imaging was initially performed with the patient in the semi upright orientation. 50 cc of Omnipaque 300 were ingested by the patient. This easily passed through the esophagus and into the remaining portions of the stomach. On subsequent imaging contrast failed to pass beyond the gastrojejunostomy site. Multiple maneuvers including right lateral and upright orientations were performed in an attempt to pass contrast into the proximal small bowel loops without success. No extravasation of contrast material identified from the stomach. IMPRESSION: 1. Satisfactory opacification of the gastric lumen without evidence for contrast extravasation to suggest leakage. 2. Lack of contrast passage through  gastrojejunostomy. Findings may reflect postoperative gastroparesis versus obstruction at the gastrojejunostomy site. A 1 hour delayed plain film radiograph of the abdomen will be obtained to assess for emptying of the gastric lumen. Electronically Signed   By: Kerby Moors M.D.   On: 04/01/2019 15:48     Kalman Drape, [facility] Surgery Please see amion for pager for the following: Cristine Polio, & Friday 7:00am - 4:30pm Thursdays 7:00am -11:30am

## 2019-04-03 NOTE — Progress Notes (Signed)
Patient reported that he's been having blood coming out on his rectum whenever he passes gas. Patient shows some toilet paper with smear of blood. We will continue monitor.

## 2019-04-04 ENCOUNTER — Telehealth: Payer: Self-pay | Admitting: *Deleted

## 2019-04-04 LAB — BASIC METABOLIC PANEL
Anion gap: 9 (ref 5–15)
BUN: 33 mg/dL — ABNORMAL HIGH (ref 8–23)
CO2: 30 mmol/L (ref 22–32)
Calcium: 8.8 mg/dL — ABNORMAL LOW (ref 8.9–10.3)
Chloride: 102 mmol/L (ref 98–111)
Creatinine, Ser: 0.91 mg/dL (ref 0.61–1.24)
GFR calc Af Amer: >60 mL/min (ref >60)
GFR calc non Af Amer: >60 mL/min (ref >60)
Glucose, Bld: 161 mg/dL — ABNORMAL HIGH (ref 70–99)
Potassium: 3.9 mmol/L (ref 3.5–5.1)
Sodium: 141 mmol/L (ref 135–145)

## 2019-04-04 LAB — CBC
HCT: 34.1 % — ABNORMAL LOW (ref 39.0–52.0)
Hemoglobin: 10.8 g/dL — ABNORMAL LOW (ref 13.0–17.0)
MCH: 28.4 pg (ref 26.0–34.0)
MCHC: 31.7 g/dL (ref 30.0–36.0)
MCV: 89.7 fL (ref 80.0–100.0)
Platelets: 225 10*3/uL (ref 150–400)
RBC: 3.8 MIL/uL — ABNORMAL LOW (ref 4.22–5.81)
RDW: 12.6 % (ref 11.5–15.5)
WBC: 4.4 10*3/uL (ref 4.0–10.5)
nRBC: 0 % (ref 0.0–0.2)

## 2019-04-04 LAB — GLUCOSE, CAPILLARY: Glucose-Capillary: 140 mg/dL — ABNORMAL HIGH (ref 70–99)

## 2019-04-04 MED ORDER — DOCUSATE SODIUM 50 MG/5ML PO LIQD: 100.0000 mg | Freq: Every day | ORAL | 0 refills | Status: DC

## 2019-04-04 MED ORDER — FREE WATER: 200.0000 mL | Freq: Three times a day (TID) | 0 refills | Status: DC

## 2019-04-04 MED ORDER — ONDANSETRON HCL 4 MG PO TABS: 4.0000 mg | ORAL_TABLET | Freq: Every day | ORAL | 1 refills | Status: DC | PRN

## 2019-04-04 MED ORDER — GABAPENTIN 250 MG/5ML PO SOLN: 100.0000 mg | Freq: Two times a day (BID) | ORAL | 0 refills | Status: DC

## 2019-04-04 MED ORDER — FREE WATER: 200.0000 mL | Freq: Three times a day (TID) | 0 refills | Status: AC

## 2019-04-04 MED ORDER — ACETAMINOPHEN 160 MG/5ML PO SOLN: 650.0000 mg | Freq: Four times a day (QID) | ORAL | 0 refills | Status: DC | PRN

## 2019-04-04 MED ORDER — OSMOLITE 1.5 CAL PO LIQD: 1000.0000 mL | ORAL | 0 refills | Status: DC

## 2019-04-04 MED ORDER — FAMOTIDINE 40 MG/5ML PO SUSR: 20.0000 mg | Freq: Every day | ORAL | 0 refills | Status: DC

## 2019-04-04 MED ORDER — OXYCODONE HCL 5 MG/5ML PO SOLN: 5.0000 mg | Freq: Four times a day (QID) | ORAL | 0 refills | Status: AC | PRN

## 2019-04-04 MED ORDER — OXYCODONE HCL 5 MG/5ML PO SOLN: 5.0000 mg | Freq: Four times a day (QID) | ORAL | 0 refills | Status: DC | PRN

## 2019-04-04 MED ORDER — OSMOLITE 1.5 CAL PO LIQD: 1000.0000 mL | ORAL | 0 refills | Status: AC

## 2019-04-04 MED FILL — GABAPENTIN 250 MG/5ML SOLN: 250 | 30 days supply | Qty: 120 | Fill #0

## 2019-04-04 MED FILL — FAMOTIDINE 40 MG/5 ML SUSP: 40 | 20 days supply | Qty: 50 | Fill #0

## 2019-04-04 MED FILL — ONDANSETRON HCL 4 MG TABLET: 4 | 30 days supply | Qty: 30 | Fill #0

## 2019-04-04 NOTE — Progress Notes (Signed)
CSW and [facility] consulted TOC RN CM due to pt's RN receiving a call from pt's family stating pt D/C'd home with the understanding that pt's tube-feeding supplies would be delivered to pt's home prior to pt's D/C home from the medical floor tonight.  RN can be reached at [phone]  Per [facility] RN CM [employer] that should supply this equipment is now closed and that [employer] is also closed tomorrow.  Per family, they are under the impression that pt's feeding tube equipment should arrive on 04/04/18 but per the [facility] RN CM there is no way to confirm that and thus no way to confirm pt will have a means to take in needed nutrients.  Per RN CM, RN CM will email Gwinda Passe with the DME supply company to request clarification.  AC will leave handoff with incoming PM AC.  CSW will continue to follow for D/C needs.  Alphonse Guild. Rivky Clendenning, LCSW, LCAS, CSI Transitions of Care Clinical Social Worker Care Coordination Department Ph: [phone]

## 2019-04-04 NOTE — Plan of Care (Signed)
Patient is discharged home today. Instructions are written and explained to both patient and his wife. Wife took video of directions on how to use the feeding tube, how to flush the tube, and how to administer medications through the tube. Patient and wife demonstrated properly connecting and disconnecting the feeding tube and feedings. Video was also taken of that process. Home health is to bring the feeding machine to the home today. Patient was taken to the main entrance in a wheelchair.

## 2019-04-04 NOTE — Progress Notes (Addendum)
04/04/2019 6:30 pm TOC CM received call from [place] that family called and they have not received tube feedings. Contacted TOC CM, Rhonda to follow up on [facility] set up. States HH was set up with [facility]. Contacted [facility] rep, Cassie. States they are following up on tube feedings. Spoke to [facility] rep, Amy and they do not arrange tube feedings. [facility] and spoke to rep, Pam and tube feedings were not set up with their agency. Attempted contact to [place] and office is closed. Email message sent to [place] Director for assistance. Updated given Jonathan Lewis AC. Spoke to pt's wife, Jonathan Lewis to make her aware that tube feedings will have to arranged with provider on Sat, Jan 2 as offices are closed on Jan 1. States she was only given pepcid and zofran at the [facility]. She did not received Rx for pain medication. Will send message to attending to escribed to pharmacy. Will check to [facility] [address] on tomorrow. [place] is closed for holiday and weekend. Jonnie Finner RN CCM, [facility] ED TOC CM [phone]  04/04/2019 8:30 pm Message sent to [facility] Velva Harman with update. States she will follow up on [facility] and tube feedings. Update given to [place] and she will follow up with ED physician and contact wife. [place], [place] [facility] TOC CM [phone]

## 2019-04-04 NOTE — Progress Notes (Signed)
[name], male   DOB: 18-Sep-1940, 78 y.o.   MRN: [id number]   Acute Care Surgery Service Progress Note:    Chief Complaint/Subjective: j tube successfully unclogged in IR yesterday Tolerating J tube feeds +flatus; bm yesterday  No n/v 3l out of g tube - mix of gastric fluid and oral intake  Objective: Vital signs in last 24 hours: Temp:  [97.5 F (36.4 C)-98 F (36.7 C)] 98 F (36.7 C) (12/31 0458) Pulse Rate:  [69-71] 71 (12/31 0458) Resp:  [17-19] 19 (12/31 0458) BP: (97-107)/(66-74) 102/66 (12/31 0458) SpO2:  [100 %] 100 % (12/31 0458) Weight:  [74.6 kg] 74.6 kg (12/31 0500) Last BM Date: 04/03/19  Intake/Output from previous day: 12/30 0701 - 12/31 0700 In: 2940.6 [P.O.:1610; I.V.:110.6; NG/GT:720; IV Piggyback:500] Out: 3900 [Urine:500; Drains:3400] Intake/Output this shift: No intake/output data recorded.  Lungs: cta, nonlabored  Cardiovascular: reg  Abd: soft, nt, nd, tubes secure  Extremities: no edema, +SCDs  Neuro: alert, nonfocal  Lab Results: CBC  Recent Labs    04/04/19 0419  WBC 4.4  HGB 10.8*  HCT 34.1*  PLT 225   BMET Recent Labs    04/03/19 0959 04/04/19 0419  NA 140 141  K 4.0 3.9  CL 99 102  CO2 32 30  GLUCOSE 216* 161*  BUN 36* 33*  CREATININE 0.95 0.91  CALCIUM 9.0 8.8*   LFT Hepatic Function Latest Ref Rng & Units 03/25/2019 03/21/2019 03/18/2019  Total Protein 6.5 - 8.1 g/dL 6.0(L) 5.9(L) 5.8(L)  Albumin 3.5 - 5.0 g/dL 3.1(L) 3.1(L) 3.2(L)  AST 15 - 41 U/L 46(H) 14(L) 14(L)  ALT 0 - 44 U/L 77(H) 16 13  Alk Phosphatase 38 - 126 U/L 101 40 40  Total Bilirubin 0.3 - 1.2 mg/dL 0.9 0.6 0.4  Bilirubin, Direct 0.0 - 0.3 mg/dL - - -   PT/INR No results for input(s): LABPROT, INR in the last 72 hours. ABG No results for input(s): PHART, HCO3 in the last 72 hours.  Invalid input(s): PCO2, PO2  Studies/Results:  Anti-infectives: Anti-infectives (From admission, onward)   Start     Dose/Rate Route  Frequency Ordered Stop   03/26/19 1015  ceFAZolin (ANCEF) IVPB 2g/100 mL premix     2 g 200 mL/hr over 30 Minutes Intravenous On call to O.R. 03/26/19 1002 03/26/19 1533   03/19/19 0600  cefoTEtan (CEFOTAN) 2 g in sodium chloride 0.9 % 100 mL IVPB     2 g 200 mL/hr over 30 Minutes Intravenous On call to O.R. 03/18/19 1448 03/19/19 1255      Medications: Scheduled Meds: . Chlorhexidine Gluconate Cloth  6 each Topical Daily  . docusate  100 mg Per Tube Daily  . enoxaparin (LOVENOX) injection  40 mg Subcutaneous Q24H  . free water  200 mL Per Tube Q8H  . gabapentin  100 mg Per Tube Q12H  . insulin aspart  0-15 Units Subcutaneous Q6H  . lactulose  20 g Per J Tube BID  . latanoprost  1 drop Both Eyes QHS  . mouth rinse  15 mL Mouth Rinse BID  . pantoprazole sodium  40 mg Per J Tube Daily  . sodium chloride flush  10-40 mL Intracatheter Q12H   Continuous Infusions: . sodium chloride 10 mL/hr at 03/27/19 1306  . dextrose 5 % and 0.9% NaCl 10 mL/hr at 03/31/19 2121  . feeding supplement (OSMOLITE 1.5 CAL) 1,000 mL (04/03/19 0621)  . methocarbamol (ROBAXIN) IV    . Pedialyte 240  mL (03/19/19 2200)   PRN Meds:.acetaminophen (TYLENOL) oral liquid 160 mg/5 mL, cycloSPORINE, hydrALAZINE, magnesium hydroxide, menthol-cetylpyridinium, methocarbamol (ROBAXIN) IV, mineral oil, morphine injection, ondansetron (ZOFRAN) IV, phenol, sodium chloride flush  Assessment/Plan: Patient Active Problem List   Diagnosis Date Noted  . Gastric cancer pT3pN2 s/p distal gastrectomy with Billroth II gastrojejunostomy 03/19/2019 03/24/2019  . H/O exploratory laparotomy 03/19/2019  . Malnutrition of moderate degree 03/08/2019  . Gastric outlet obstruction 03/07/2019  . Weight loss 03/05/2019  . Nausea and vomiting 03/05/2019  . Decreased appetite 03/05/2019  . Epigastric pain 12/05/2018  . Hyperglycemia 12/05/2018  . Left lumbar radiculopathy 01/02/2018  . Leg pain, bilateral 11/08/2017  . Bursitis of  right hip 05/10/2017  . Left shoulder pain 05/10/2017  . Physical deconditioning 05/10/2017  . Wheezing 12/13/2016  . Cough 11/03/2016  . Neck pain on left side 11/03/2016  . Bilateral leg paresthesia 11/10/2015  . Acoustic neuroma ([place]) 06/25/2015  . Hematochezia 05/13/2015  . Abnormal finding on MRI of brain 05/13/2015  . Vertigo 05/05/2015  . GERD (gastroesophageal reflux disease) 05/05/2015  . BPH (benign prostatic hyperplasia) 05/05/2015  . Bradycardia   . Chest pain 07/18/2014  . Allergic rhinitis 07/18/2014  . Pain of right thumb 07/18/2014  . Male hypogonadism 07/16/2014  . Preventative health care 07/17/2013  . Right sided sciatica 07/17/2013  . Constipation 01/21/2012  . Leukopenia 06/11/2011  . HEARING LOSS 04/10/2009  . GROIN PAIN 01/26/2009  . Osteoarthritis, hand 10/27/2008  . LOW BACK PAIN, CHRONIC 10/27/2008  . Diabetes ([place]) 07/17/2008  . Hyperlipidemia 06/06/2007  . EXTERNAL HEMORRHOIDS 02/15/2007  . Benign nodular prostatic hyperplasia 02/15/2007  . ERECTILE DYSFUNCTION 10/31/2006  . Hypertension complicating diabetes ([place]) 10/31/2006  Assessment/Plan Active Problems: Diabetes ([place]) Hyperlipidemia- holding home statin Hypertension complicating diabetes ([place]) Benign nodular prostatic hyperplasia- good urine output LOW BACK PAIN, CHRONIC BPH (benign prostatic hyperplasia) Bilateral leg paresthesia Gastric outlet obstruction Malnutrition of moderate degree  Hypertension- well controlled, meds PRN Diabetes-secondary to TNA/tube feeds, SSI, dietician consult to switch tube feeds Mild malnutrition-treated Gastric ulcers/GERD- PPI  Gastric cancer pT3pN2 -s/p Exploratory laparotomy, distal gastrectomy with Billroth II gastrojejunostomy, portal lymph node dissection, placement of a 24 French gastrostomy tube, placement of feeding jejunostomy tube 03/20/2019- Dante Roudebush - S/PINSERTION PORT-A-CATH- 03/26/2019 - Gross - G tube  to gravity  - UGIneg for leak but showed no passage through gastrojejunostomy  STM:HDQQ feedings_0 /hr/clear liquids for comfort/G tube to gravity VTE: SCD's, lovenox IW:LNLG currently Foley:none Follow up:Dr. Daxten Kovalenko/Dr. Burney Gauze  DISPO: will not switch TF to glucerna- blood sugars have been ok past few days; will dc home today with home health nursing. Will need support for  j tube feeds/water flushes, monitoring;  Pt and wife need instruction today on g/j tube care/flushes etc, remove PICC line today. Discussed dc instructions with pt along with venting/clampig g tube as needed.   Discussed with case manager - told everything is setup for home tube feeds and HHN.    LOS: 28 days    Leighton Ruff. Redmond Pulling, MD, FACS General, Bariatric, & Minimally Invasive Surgery [phone] [facility]

## 2019-04-04 NOTE — Discharge Instructions (Signed)
Your J tube (red) feed feeding regimen is Osmolite1.5 - 20ml per hour with 200 ml of free water three times a day Do not put any crushed medications thru the J tube - only liquid medications. All your medications need to be given thru the J (red) tube  You will need to place/connect the G tube to gravity (connect to a bag) to allow your stomach to empty.  Your stomach is not emptying normally. This is common after stomach surgery.  So you can expect to see what you drink to empty into the bag.  You can clamp your G tube (disconnect it from the bag) as needed. But if you experience bloating, nausea, &/or vomiting you will need to connect your G tube to the bag  ABDOMINAL SURGERY: POST OP INSTRUCTIONS  1. DIET: You can have clear liquids by mouth.  This will only be for comfort since your remaining stomach is not emptying yet.  The bulk of your nutrition is going to be thru your J tube (the red tube)  2. Take your usually prescribed home medications unless otherwise directed. 3. PAIN CONTROL: a. Pain is best controlled by a usual combination of three different methods TOGETHER: i. Ice/Heat ii. Over the counter pain medication iii. Prescription pain medication b. Most patients will experience some swelling and bruising around the incisions.  Ice packs or heating pads (30-60 minutes up to 6 times a day) will help. Use ice for the first few days to help decrease swelling and bruising, then switch to heat to help relax tight/sore spots and speed recovery.  Some people prefer to use ice alone, heat alone, alternating between ice & heat.  Experiment to what works for you.  Swelling and bruising can take several weeks to resolve.   c. It is helpful to take an over-the-counter pain medication regularly for the first few weeks.  Choose one of the following that works best for you:  i. Acetaminophen (Tylenol, etc) 500-650mg  four times a day (every meal & bedtime) d. A  prescription for pain medication (such  as oxycodone, hydrocodone, etc) should be given to you upon discharge.  Take your pain medication as prescribed.  i. If you are having problems/concerns with the prescription medicine (does not control pain, nausea, vomiting, rash, itching, etc), please call us [phone] to see if we need to switch you to a different pain medicine that will work better for you and/or control your side effect better. ii. If you need a refill on your pain medication, please contact your pharmacy.  They will contact our office to request authorization. Prescriptions will not be filled after 5 pm or on week-ends. 4. Avoid getting constipated.  Between the surgery and the pain medications, it is common to experience some constipation.  A mild laxative (prune juice, Milk of Magnesia, MiraLax, etc) should be taken according to package directions if there are no bowel movements after 48 hours.   5. Watch out for diarrhea.  If you have many loose bowel movements, simplify your diet to bland foods & liquids for a few days.  Stop any stool softeners and decrease your fiber supplement.  Switching to mild anti-diarrheal medications (Kayopectate, Pepto Bismol) can help.  If this worsens or does not improve, please call us. 6. Wash / shower every day.  You may shower over the incision / wound.  Avoid baths until the skin is fully healed.  Continue to shower over incision(s) after the dressing is off. 7. Remove  your waterproof bandages 5 days after surgery.  You may leave the incision open to air.  Remove any wicks or ribbons in your wound.  If you have an open wound, please see wound care instructions. You may replace a dressing/Band-Aid to cover the incision for comfort if you wish. 8. ACTIVITIES as tolerated:   a. You may resume regular (light) daily activities beginning the next day--such as daily self-care, walking, climbing stairs--gradually increasing activities as tolerated.  If you can walk 30 minutes without difficulty, it is  safe to try more intense activity such as jogging, treadmill, bicycling, low-impact aerobics, swimming, etc. b. Save the most intensive and strenuous activity for last such as sit-ups, heavy lifting, contact sports, etc  Refrain from any heavy lifting or straining until you are off narcotics for pain control.   c. DO NOT PUSH THROUGH PAIN.  Let pain be your guide: If it hurts to do something, don't do it.  Pain is your body warning you to avoid that activity for another week until the pain goes down. d. You may drive when you are no longer taking prescription pain medication, you can comfortably wear a seatbelt, and you can safely maneuver your car and apply brakes. e. Dennis Bast may have sexual intercourse when it is comfortable.  9. FOLLOW UP in our office a. Please call [facility] at [phone] to set up an appointment to see your surgeon in the office for a follow-up appointment approximately 1-2 weeks after your surgery. b. Make sure that you call for this appointment the day you arrive home to insure a convenient appointment time. 10. IF YOU HAVE DISABILITY OR FAMILY LEAVE FORMS, BRING THEM TO THE OFFICE FOR PROCESSING.  DO NOT GIVE THEM TO YOUR DOCTOR.   WHEN TO CALL us [phone]: 1. Poor pain control 2. Reactions / problems with new medications (rash/itching, nausea, etc)  3. Fever over 101.5 F (38.5 C) 4. Inability to urinate 5. Nausea and/or vomiting 6. Worsening swelling or bruising 7. Continued bleeding from incision. 8. Increased pain, redness, or drainage from the incision 9. Worsening weakness, muscle cramps 10.  problems with G & J tubes  The clinic staff is available to answer your questions during regular business hours (8:30am-5pm).  Please don't hesitate to call and ask to speak to one of our nurses for clinical concerns.   A surgeon from [facility] is always on call at the hospitals   If you have a medical emergency, go to the nearest emergency room or call  911.    [facility], [place], [place], [place], [place]  [address] ? MAIN: [phone] ? TOLL FREE: [phone] ? FAX [phone] [email or web address]  Gastrostomy Tube Home Guide, Adult You have a gastrostomy (G) tube and a jejunostomy (J) tube - the red tube. The smaller red jejunostomy tube is where your tube feedings and water go in.  Do not put any crush pills through this small tube.  It will almost certainly clog up the tube. FLUSH the J tube with 10 ml water before and after medication administration Call the office if you have any issues the home health nurse cannot resolve. A gastrostomy tube, or G-tube, is a tube that is inserted through the abdomen into the stomach. The tube is used to allow your stomach to empty as it is healing from surgery.  How to care for a G-tube Supplies needed  Saline solution or clean, warm water and soap.  Cotton swab or gauze.  Precut gauze bandage (dressing) and tape, if needed. Instructions 1. Wash your hands with soap and water. 2. If there is a dressing between the person's skin and the tube, remove it. 3. Check the area where the tube enters the skin. Check for problems such as: ? Redness. ? Swelling. ? Pus-like drainage. ? Extra skin growth. 4. Moisten the cotton swab with the saline solution or soap and water mixture. Gently clean around the insertion site. Remove any drainage or crusting. ? When the G-tube is first put in, a normal saline solution or water can be used to clean the skin. ? Mild soap and warm water can be used when the skin around the G-tube site has healed. 5. If there should be a dressing between the person's skin and the tube, apply it at this time. How to flush a G or J-tube Flush the G-tube regularly to keep it from clogging. Flush it before and after feedings and as often as told by the health care provider. Supplies needed  Purified or sterile water, warmed. If  the person has a weak disease-fighting (immune) system, or if he or she has difficulty fighting off infections (is immunocompromised), use only sterile water. ? If you are unsure about the amount of chemical contaminants in purified or drinking water, use sterile water. ? To purify drinking water by boiling:  Boil water for at least 1 minute. Keep lid over water while it boils. Allow water to cool to room temperature before using.  60cc G-tube syringe. Instructions 1. Wash your hands with soap and water. 2. Draw up 30 mL of warm water in a syringe. 3. Connect the syringe to the tube. 4. Slowly and gently push the water into the tube. G/J-tube problems and solutions  If the tube comes out: ? Cover the opening with a clean dressing and tape. ? Call a health care provider right away. ? A health care provider will need to put the tube back in within 4 hours.  If there is skin or scar tissue growing where the tube enters the skin: ? Keep the area clean and dry. ? Secure the tube with tape so that the tube does not move around too much. ? Call a health care provider.  If the tube gets clogged: ? Slowly push warm water into the tube with a large syringe. ? Do not force the fluid into the tube or push an object into the tube. ? If you are not able to unclog the tube, call a health care provider right away. Follow these instructions at home: Feedings  Give feedings at room temperature.  Cover and place unused feedings in the refrigerator.  If feedings are continuous: ? Do not put more than 4 hours worth of feedings in the feeding bag. ? Stop the feedings when you need to give medicine or flush the tube. Be sure to restart the feedings. ? Make sure the person's head is above his or her stomach (upright position). This will prevent choking and discomfort.  Replace feeding bags and syringes as told by the health care provider.  Make sure the person is in the right position during and  after feedings: ? During feedings, the person's position should be in the upright position. ? After a noncontinuous feeding (bolus feeding), have the person stay in the upright position for 1 hour. General instructions  Only use syringes made for G or J-tubes.  Do not pull or put tension on  the tube.  Clamp the tube before removing the cap or disconnecting a syringe.  Measure the length of the G-tube every day from the insertion site to the end of the tube.  If the person's G-tube has a balloon, check the fluid in the balloon every week. The amount of fluid that should be in the balloon can be found in the manufacturer's specifications.  Make sure the person takes care of his or her oral health, such as by brushing his or her teeth.  Remove excess air from the G-tube as told by the person's health care provider. This is called "venting."  Keep the area where the tube enters the skin clean and dry.  Do not push feedings, medicines, or flushes rapidly. Contact a health care provider if:  The person with the tube has any of these problems: ? Constipation. ? Fever.  There is a large amount of fluid or mucus-like liquid leaking from the tube.  Skin or scar tissue appears to be growing where the tube enters the skin.  The length of tube from the insertion site to the G-tube gets longer. Get help right away if:  The person with the tube has any of these problems: ? Severe abdominal pain. ? Severe tenderness. ? Severe bloating. ? Nausea. ? Vomiting. ? Trouble breathing. ? Shortness of breath.  Any of these problems happen in the area where the tube enters the skin: ? Redness, irritation, swelling, or soreness. ? Pus-like discharge. ? A bad smell.  The tube is clogged and cannot be flushed.  The tube comes out. Summary  A gastrostomy tube, or G-tube, is a tube that is inserted through the abdomen into the stomach. The tube is used to give feedings and medicines when a  person is unable to eat and drink enough on his or her own.  Check and clean the insertion site daily as told by the person's health care provider.  Flush the G-tube regularly to keep it from clogging. Flush it before and after feedings and as often as told by the person's health care provider.  Keep the area where the tube enters the skin clean and dry. This information is not intended to replace advice given to you by your health care provider. Make sure you discuss any questions you have with your health care provider. Document Released: 05/30/2001 Document Revised: 03/03/2017 Document Reviewed: 05/16/2016 Elsevier Patient Education  2020 [employer].    How to Care for a Feeding Tube  A feeding tube is a tube used to give medicine, water, and liquid food. A person may have this tube if she or he has trouble swallowing or cannot take food or medicine. Supplies needed to care for the tube site:  Clean gloves.  Clean washcloth, gauze pads, or soft paper towel.  Cotton swabs.  A skin barrier ointment or cream, such as petroleum jelly.  Soap and water.  Pre-cut foam pads or gauze (for around the tube).  Tube tape.  An anchoring device (optional). How to care for the tube site  1. Have all supplies ready and close to you. 2. Wash your hands. 3. Put on gloves. 4. Change any pad or gauze near the tube if: ? It is dirty. ? It is wet. ? It has been there for more than one day. 5. Check the skin around the tube. Call the doctor if you see any of these: ? Red skin. ? A rash. ? Swelling. ? Leaking fluid. ? Extra skin.  6. Dip the gauze and cotton swabs in water and soap. 7. Use the cotton swabs to wipe the skin that is closest to the tube. 8. Use the gauze pads to wipe the rest of the skin near the tube. 9. Rinse with water. 10. Dry the area with a clean washcloth, dry gauze pad, or soft paper towel. 11. If the skin is red, use a cotton swab to put on a skin barrier cream  or ointment. Put it on by making little circles. Do not apply antibiotic ointments at the tube site. 12. Put a new pre-cut foam pad or gauze around the tube. If there is no fluid at the tube site, you do not need a pad or gauze. 13. Tape down the edges. 14. Use tape or an anchoring device to attach the tube to the skin. Do this for comfort or as told. Each time you use tape, put it in a different place. 15. Sit the person up. 16. Throw away used supplies. 17. Take off your gloves. 18. Wash your hands. Supplies needed to flush a feeding tube:  Clean gloves.  A clean 60 mL syringe that connects to the feeding tube.  A towel.  Germ-free (sterile) or purified water. Follow these rules: ? Use germ-free water if:  Your body's defense system (immune system) is weak and you have trouble getting better from infections (are immunocompromised).  You do not know how many chemicals are in your water. ? Do not use water from lakes or other bodies of water unless you treat it or filter it first. ? To make drinking water pure by boiling:  Boil water for 1 minute or longer. Keep a lid over the water while it boils.  Let the water cool off to room temperature before you use it. How to flush a feeding tube 1. Have all supplies ready and close to you. 2. Wash your hands. 3. Put on gloves. 4. Pull 30 mL of water into the syringe. 5. Before you push water into (flush) the tube, put the towel under the tube to catch any fluid leaks. 6. Bend (kink) the feeding tube while you do one of these things: ? Disconnect it from the feeding-bag tubing. ? Take off the cap at the end of the tube. 7. Put the tip of the syringe into the end of the feeding tube. 8. Stop bending the tube. 9. Use the syringe to slowly put the water in the tube. If the water will not go in the tube: ? Have the person lie on his or her left side. ? Try putting the water in the tube again. ? Do not push hard to make the water go  in. 10. Take out the syringe and put the cap on the tube. 11. Throw away used supplies. 12. Take off your gloves. 13. Wash your hands. Follow these instructions at home: Caring for the tube  If the person has a foam pad or gauze near the tube, change it: ? Every day. ? When it is dirty. ? When it is wet.  Do not put antibiotic ointments by the tube. Flushing the tube  Do not use a syringe that is smaller than 60 mL.  Flush the tube at all of these times: ? Before you give medicine. ? Between medicines. ? After the person gets the last medicine before a feeding.  Do not mix medicines with formula. Do not mix medicines with other medicines.  Completely flush medicines through the tube.  That way, they will not mix with formula. Contact a doctor if:  The tube gets blocked or clogged.  You find any of these on the skin around the tube site: ? Red skin. ? A rash. ? Swelling. ? Leaking fluid. ? Extra skin. Summary  A feeding tube is a tube used to give medicine, water, and liquid food. A person may have this tube if she or he has trouble swallowing or cannot take food or medicine.  Follow the doctor's instructions to care for the tube site and flush the tube every day.  Contact a doctor if the tube gets blocked or clogged. This information is not intended to replace advice given to you by your health care provider. Make sure you discuss any questions you have with your health care provider. Document Released: 12/14/2011 Document Revised: 03/03/2017 Document Reviewed: 04/29/2016 Elsevier Patient Education  2020 [place].    Gastric Cancer  Gastric cancer is also called stomach cancer. It is an abnormal growth of cancerous (malignant) cells in the stomach. What are the causes? The exact cause of gastric cancer is not known. What increases the risk? You are more likely to develop this condition if you:  Are older than age 81.  Are male.  Are of Asian-American,  Pacific Islander, Hispanic, or African-American descent.  Eat a diet that includes a lot of foods that are smoked, salted, or pickled.  Use any tobacco products, including cigarettes, chewing tobacco, or e-cigarettes.  Drink alcohol excessively.  Are overweight.  Have a history of: ? Stomach surgery. ? Chronic gastritis. ? Gastric polyps. ? Pernicious anemia.  Have any of the following: ? An H. pylori stomach infection. ? A family history of gastric cancer. ? Blood type A. ? An Epstein-Barr virus (EBV) infection. ? Common variable immune deficiency (CVID).  Work in conditions that expose you to coal, metal, or rubber. What are the signs or symptoms? Symptoms may include:  Loss of appetite.  Feeling full after eating a small meal.  Trouble swallowing.  Nausea.  Pain in the abdomen, usually above the belly button (navel).  Excessive gas or belching.  Heartburn and indigestion.  Swelling or fluid buildup in the abdomen.  Losing weight without trying (unintentional weight loss).  Vomiting. This may include vomiting blood.  Bloody stool (feces).  Low number of red blood cells (anemia).  Fatigue. How is this diagnosed? This condition may be diagnosed based on:  Your symptoms and medical history.  A physical exam. This may involve having a procedure in which a tube with a light and a camera on the end is inserted through your mouth and moved down your throat to your stomach (endoscopic exam).  Blood tests.  A procedure in which you swallow a solution (barium) before X-rays are done to evaluate the stomach and other structures (barium swallow). The barium shows up well on X-rays, making it easier for your health care provider to see possible problems.  Imaging tests, such as a CT scan, MRI, X-ray, or PET scan.  Removal of a sample of stomach cells to be tested for cancer (biopsy). Your cancer will be assessed (staged) to determine how severe it is and how  much it has spread (metastasized). How is this treated? Treatment for gastric cancer depends on the type and stage of the cancer. Treatment may include one or more of the following:  Surgery to remove as much of the cancer as possible (gastrectomy). This surgery may also involve removing nearby lymph  nodes to be checked for cancer cells.  Medicines that kill cancer cells (chemotherapy).  High-energy rays that kill cancer cells (radiation therapy).  Targeted therapy. This targets specific parts of cancer cells and the area around them to block the growth and the spread of the cancer. Targeted therapy can help to limit the damage to healthy cells.  Medicines that help your body's disease-fighting system (immune system) fight cancer cells (immunotherapy). Follow these instructions at home: Eating and drinking  Some of your treatments might affect your appetite and your ability to digest certain foods. If you are having problems eating or if you do not have an appetite, meet with a diet and nutrition specialist (dietitian).  If you have side effects that affect eating, it may help to: ? Eat smaller meals and snacks often. ? Drink high-nutrition and high-calorie shakes or supplements. ? Eat bland and soft foods that are easy to eat. ? Avoid foods that are hot, spicy, or hard to swallow.  Follow instructions from your health care provider about eating or drinking restrictions. You may need to avoid or eat less of these items: ? Red meat. ? Processed meat, such as deli meat. ? Salty foods. ? Smoked foods. ? Pickled foods.  Do not drink alcohol. General instructions  Do not use any products that contain nicotine or tobacco, such as cigarettes and e-cigarettes. If you need help quitting, ask your health care provider.  Take over-the-counter and prescription medicines only as told by your health care provider.  Consider joining a support group for people who have been diagnosed with  gastric cancer.  Work with your health care provider to manage any side effects of treatment.  Keep all follow-up visits as told by your health care provider. This is important. Where to find more information  American Cancer Society: www.cancer.[place] www.cancer.gov Contact a health care provider if:  You have a fever.  You have difficulty eating.  You have problems with your medicines.  You continue to lose weight without trying.  You have nausea, diarrhea, sweating, and red skin (flushing) after eating (dumping syndrome). Get help right away if:  You have any of the following problems that do not get better with medicine: ? Pain. ? Nausea. ? Vomiting. ? Diarrhea.  You have severe pain.  You vomit blood or black material that looks like coffee grounds.  You have trouble breathing.  You faint. Summary  Gastric cancer is also called stomach cancer. It is an abnormal growth of cancerous (malignant) cells in the stomach.  Your cancer will be assessed (staged) to determine how severe it is and how much it has spread (metastasized).  Work with your health care provider to manage any side effects of treatment.  Consider joining a support group for people who have been diagnosed with gastric cancer. This information is not intended to replace advice given to you by your health care provider. Make sure you discuss any questions you have with your health care provider. Document Released: 12/24/2003 Document Revised: 03/03/2017 Document Reviewed: 12/29/2016 Elsevier Patient Education  2020 [place]:  INSTRUCTIONS AFTER SURGERY  The Chemotherapy / Oncology nurse will remove your waterproof bandages in their office a few days after surgery.  Do not remove the bandages unless you are at least 5 days out from surgery or as otherwise instructed  Wash / shower every day.   You may shower over the dressings as  they are waterproof.  Continue to shower over incision(s) after the dressing is off.  Call for redness/pain around port a cath site   [facility], [place], [place], [place], [place]  [address] ? MAIN: [phone] ? TOLL FREE: [phone] ?  FAX [phone] [email or web address]   How To Give Medicine Through a Feeding Tube A feeding tube is a soft, flexible tube through which medicine, water, and liquid food can be given. A person may have a feeding tube if she or he has trouble swallowing or cannot have food, liquid, or medicine by mouth. This sheet gives you information about how to give medicine through a feeding tube. A health care provider may also give you more specific instructions. If you have problems or questions, contact a health care provider. Supplies needed:  60 mL catheter-tip syringe(s).  Medicine(s).  Pill crusher, if the medicine is in tablet form.  Small, clean medicine cup(s), one for each medicine that you will be giving.  Purified water, at room temperature. Purify drinking water by boiling it: ? Boil water for 1 minute or longer. Keep a lid over the water while it boils. ? Allow the water to cool to room temperature before you use it.  Sterile water, at room temperature (if you will not be using purified water). Use this if: ? The person has a weak immune system and his or her body has difficulty protecting itself from infections (is immunocompromised). ? You are unsure about the amount of chemical contaminants in purified water or drinking water. How to prepare the medicine If the medicine cannot be given with the tube feeding, stop the feeding. Before you go on to the following steps, wait the amount of time that the person's health care provider has directed (hold time). You may need to wait 60 minutes or up to 2 hours, depending on whether the person needs to take medicine on an empty stomach. 1. Raise  (elevate) the person's head so it is above the level of his or her stomach. Do this with pillows or by raising the head of the bed. 2. Wash your hands with soap and water. 3. Check the placement of the tube as directed. 4. Prepare the medicine that will go into the tube: ? If the medicine is a liquid, mix it in a clean medicine cup with 30 mL of water, or with the amount of water recommended by the health care provider.  ? Do not crush or use extended-release, long-acting, or delayed-release medicines with any type of feeding tube. Those may deliver too much medicine at one time. How to flush the tube Always rinse out (flush) the tube before and after giving medicine. Follow these steps to make sure the tube is clear: 1. Push the plunger all the way into a new, clean syringe to empty it. Doing this will set the syringe to zero (empty). 2. Put the tip of the syringe into the purified or sterile water. While the syringe is in the water, pull back on the syringe plunger until the water level on the syringe barrel is 15 mL. 3. Before removing the cap or connecting the syringe, close the tube by using a clamp (clamping)or bending (kinking) the tube. 4. Connect the syringe to the tube, then open the tube by removing the clamp (unclamping) or straightening (unkinking) the tube. 5. Slowly push 15 mL of water from the syringe into the tube to flush the tube. How to give the medicine through the feeding  tube 1. Pull back the syringe plunger to bring (draw up) the medicine-and-water mixture into the syringe. Never mix medicines. 2. Close (clamp or kink) the tube. 3. Disconnect the syringe that you used to rinse out (flush) the tube, then connect the medicine-and-water mixture syringe to the tube. 4. Open (unclamp or unkink) the tube. 5. Slowly push the medicine-and-water mixture into the tube. 6. Close (clamp or kink) the tube. 7. Disconnect the syringe from the tube. 8. Put the tip of the syringe into  the purified or sterile water. While the syringe is in the water, pull back on the syringe plunger until the water level on the syringe barrel is 30 mL. 9. Connect the syringe to the tube, then open (unclamp or unkink) the tube. 10. Slowly push the water from the syringe into the tube to flushthe tube. Always flush the tube before and after giving medicine. 11. Close (clamp or kink) the tube, then disconnect the syringe from the tube. 12. Put the cap on the tube. General tips  Use liquid medicines when possible.  Give the medicine only as directed. Do not mix medicines together. ? If giving only one dose of medicine, flush the tube with water after giving the medicine. ? If giving more than one dose of medicine, give each medicine separately. Flush the tube between different medicines and after the last dose of medicine. ? Use a new, clean syringe for each medicine.  If a tube feeding will be continued after the medicine, but the medicine cannot be given with the feeding: ? Wait 30-60 minutes after giving medicine. Then give the feeding. ? When appropriate, reconnect the feeding bag set or the gravity drip tubing set to the tube.  If a tube feeding will not be continued after the medicine: ? Put the cap on the tube. ? Put the person in a comfortable position, but keep his or her head raised for one hour after you have given the medicine.  Always clamp the tube before you remove the cap or before you disconnect a syringe. Contact a health care provider if:  You are not sure what types of medicines to give.  The length of tube from the insertion site to the tube gets longer. Get help right away if:  The feeding tube gets clogged, falls out, or does not work.  The skin area around the tube is red, swollen, warm to the touch, or tender.  There is more drainage than normal coming from around the tube.  The drainage from the tube has a bad smell.  The person who is getting the feeding  has any of these problems: ? Fever. ? Shortness of breath or difficulty breathing. ? Vomiting or nausea. ? Constipation or diarrhea. ? Pain or tenderness in the abdomen, or abdominal bloating (distension). Summary  A person may have a feeding tube if she or he has trouble swallowing or cannot have food, liquid, or medicine by mouth.  This sheet gives you information about how to give medicine through a feeding tube. A health care provider may also give you more specific instructions.  Before and after giving medicine through the tube, slowly push water through a syringe into the tube to rinse out (flush) the tube. Always flush the tube between different medicines and after the last dose of medicine.  If you have problems or questions, contact a health care provider. This information is not intended to replace advice given to you by your health care provider. Make  sure you discuss any questions you have with your health care provider. Document Revised: 03/03/2017 Document Reviewed: 09/05/2016 Elsevier Patient Education  [place].

## 2019-04-05 DIAGNOSIS — C169 Malignant neoplasm of stomach, unspecified: Secondary | ICD-10-CM | POA: Diagnosis not present

## 2019-04-05 DIAGNOSIS — E440 Moderate protein-calorie malnutrition: Secondary | ICD-10-CM | POA: Diagnosis not present

## 2019-04-05 NOTE — Progress Notes (Addendum)
04/05/2019 2:30 pm   TOC CM received email confirmation from [facility], spoke to [facility] and they will deliver tube feedings to home today. Will need order signed by attending physician. States she spoke to pt's wife to provide information.  Contacted [facility] rep, Amy to follow up on start of care for [facility]. Jonnie Finner RN CCM, [facility] ED TOC CM [phone]  04/05/2019 330 pm Received confirmation from [facility] rep, Amy that [facility] is scheduled visit for tomorrow. [place], [place] ED TOC CM [phone]

## 2019-04-08 DIAGNOSIS — K311 Adult hypertrophic pyloric stenosis: Secondary | ICD-10-CM | POA: Diagnosis not present

## 2019-04-08 DIAGNOSIS — Z9889 Other specified postprocedural states: Secondary | ICD-10-CM | POA: Diagnosis not present

## 2019-04-08 DIAGNOSIS — Z431 Encounter for attention to gastrostomy: Secondary | ICD-10-CM | POA: Diagnosis not present

## 2019-04-08 DIAGNOSIS — M6281 Muscle weakness (generalized): Secondary | ICD-10-CM | POA: Diagnosis not present

## 2019-04-08 DIAGNOSIS — C169 Malignant neoplasm of stomach, unspecified: Secondary | ICD-10-CM | POA: Diagnosis not present

## 2019-04-08 DIAGNOSIS — Z434 Encounter for attention to other artificial openings of digestive tract: Secondary | ICD-10-CM | POA: Diagnosis not present

## 2019-04-08 DIAGNOSIS — Z48815 Encounter for surgical aftercare following surgery on the digestive system: Secondary | ICD-10-CM | POA: Diagnosis not present

## 2019-04-08 DIAGNOSIS — E119 Type 2 diabetes mellitus without complications: Secondary | ICD-10-CM | POA: Diagnosis not present

## 2019-04-08 DIAGNOSIS — Z483 Aftercare following surgery for neoplasm: Secondary | ICD-10-CM | POA: Diagnosis not present

## 2019-04-09 ENCOUNTER — Telehealth: Payer: Self-pay | Admitting: Internal Medicine

## 2019-04-09 NOTE — Telephone Encounter (Signed)
Home Health Verbal Orders - Caller/Agency: Constance Haw with [facility]  Callback Number: [phone] Requesting OT/PT/Skilled Nursing/Social Work/Speech Therapy: needing verbals for PT  Frequency: 2 week 2 and 1 week 2

## 2019-04-09 NOTE — Telephone Encounter (Signed)
Ok for verbals 

## 2019-04-09 NOTE — Telephone Encounter (Signed)
LVM--ok verbal order by Dr. Jenny Reichmann.

## 2019-04-09 NOTE — Telephone Encounter (Signed)
Please advise 

## 2019-04-11 DIAGNOSIS — Z431 Encounter for attention to gastrostomy: Secondary | ICD-10-CM | POA: Diagnosis not present

## 2019-04-11 DIAGNOSIS — K311 Adult hypertrophic pyloric stenosis: Secondary | ICD-10-CM | POA: Diagnosis not present

## 2019-04-11 DIAGNOSIS — Z48815 Encounter for surgical aftercare following surgery on the digestive system: Secondary | ICD-10-CM | POA: Diagnosis not present

## 2019-04-11 DIAGNOSIS — E119 Type 2 diabetes mellitus without complications: Secondary | ICD-10-CM | POA: Diagnosis not present

## 2019-04-11 DIAGNOSIS — Z483 Aftercare following surgery for neoplasm: Secondary | ICD-10-CM | POA: Diagnosis not present

## 2019-04-11 DIAGNOSIS — M6281 Muscle weakness (generalized): Secondary | ICD-10-CM | POA: Diagnosis not present

## 2019-04-11 DIAGNOSIS — Z434 Encounter for attention to other artificial openings of digestive tract: Secondary | ICD-10-CM | POA: Diagnosis not present

## 2019-04-11 DIAGNOSIS — C169 Malignant neoplasm of stomach, unspecified: Secondary | ICD-10-CM | POA: Diagnosis not present

## 2019-04-11 DIAGNOSIS — Z9889 Other specified postprocedural states: Secondary | ICD-10-CM | POA: Diagnosis not present

## 2019-04-11 NOTE — Discharge Summary (Signed)
[facility] Discharge Summary   [name] MRN: [id number] DOB/AGE: 1940/08/12 79 y.o.  Admit date: 03/07/2019 Discharge date: 04/04/2019  Admitting Diagnosis: Gastric outlet obstruction HTN Type II diabetes Glaucoma BPH GERD  Discharge Diagnosis Patient Active Problem List   Diagnosis Date Noted  . Gastric cancer pT3pN2 s/p distal gastrectomy with Billroth II gastrojejunostomy 03/19/2019 03/24/2019  . H/O exploratory laparotomy 03/19/2019  . Malnutrition of moderate degree 03/08/2019  . Gastric outlet obstruction 03/07/2019  . Weight loss 03/05/2019  . Nausea and vomiting 03/05/2019  . Decreased appetite 03/05/2019  . Epigastric pain 12/05/2018  . Hyperglycemia 12/05/2018  . Left lumbar radiculopathy 01/02/2018  . Leg pain, bilateral 11/08/2017  . Bursitis of right hip 05/10/2017  . Left shoulder pain 05/10/2017  . Physical deconditioning 05/10/2017  . Wheezing 12/13/2016  . Cough 11/03/2016  . Neck pain on left side 11/03/2016  . Bilateral leg paresthesia 11/10/2015  . Acoustic neuroma ([place]) 06/25/2015  . Hematochezia 05/13/2015  . Abnormal finding on MRI of brain 05/13/2015  . Vertigo 05/05/2015  . GERD (gastroesophageal reflux disease) 05/05/2015  . BPH (benign prostatic hyperplasia) 05/05/2015  . Bradycardia   . Chest pain 07/18/2014  . Allergic rhinitis 07/18/2014  . Pain of right thumb 07/18/2014  . Male hypogonadism 07/16/2014  . Preventative health care 07/17/2013  . Right sided sciatica 07/17/2013  . Constipation 01/21/2012  . Leukopenia 06/11/2011  . HEARING LOSS 04/10/2009  . GROIN PAIN 01/26/2009  . Osteoarthritis, hand 10/27/2008  . LOW BACK PAIN, CHRONIC 10/27/2008  . Diabetes ([place]) 07/17/2008  . Hyperlipidemia 06/06/2007  . EXTERNAL HEMORRHOIDS 02/15/2007  . Benign nodular prostatic hyperplasia 02/15/2007  . ERECTILE DYSFUNCTION 10/31/2006  . Hypertension complicating diabetes ([place]) 10/31/2006     Consultants Medicine Gastroenterology, Dr. Havery Moros Oncology, Dr. Marin Olp  Imaging: No results found.  Procedures Dr. Havery Moros (03/08/19) - Upper Endoscopy, biopsies taken Dr. Rush Landmark (03/12/19) - Upper EUS Dr. Redmond Pulling (03/20/19) - exploratory laparotomy, distal gastrectomy with billroth II gastrojejunostomy, portal lymph node dissection, placement of gastrostomy and feeding jejunostomy tubes. Pathology showed invasive adenocarcinoma  Dr. Johney Maine (03/26/19) - insertion of Port-a-cath  HPI:  This is a pleasant 79 yo black male with a history of HTN and diabetes mellitus who has actually been taken off of his Metformin secondary to his recent issues with nausea and vomiting. For the last at least 1 month if not slightly longer he has been having issues with reflux as well as intermittent nausea and vomiting. He has had decrease in his oral intake and because of this his blood sugars have been lower and he has not required his Metformin. He has had a decrease in his bowel movements. He is still able to pass a little bit of flatus. He denies any blood in his stool or his vomitus. Over the last several days this has been worsening. When he lays down to go to sleep it worsens and that is when he has the most events of emesis.              He followed up with his primary care provider yesterday. He was sent for an outpatient CT scan. This revealed evidence of significant gastric distention likely as a result of a gastric outlet obstruction. There is a 2 x 1.7 cm mass noted near the pylorus but it is unable to be determined that this is the definitive etiology of his obstruction. He was referred to the [facility] emergency department for further evaluation, admission,  and management. We have been asked to evaluate the patient for further recommendations.  Hospital Course:  Workup showed GOO. GI performed an upper endoscopy and initial biopsies showed no malignancy. 2nd set of biopsies showed  adenocarcinoma of stomach. CT chest without large masses/lymphadenopathy. Patient was placed on TPN 2/2 inability to tolerate much PO. Patient went to the OR on 12/15 with Dr. Redmond Pulling for procedure listed above. Tolerated procedure well and was transferred to the floor. G tube remained to gravity and J tube was used for tube feeds. Pathology showed invasive adenocarcinoma with lymph node metastasis. Oncology was consulted. G tube was clamped and patient allowed to have clears. He became nauseated and G tube was placed back to gravity and had high output. UGI was completed and showed no leak but no gastric emptying. G tube to gravity continued and clears for comfort. Patient was tolerating tube feeds at goal. IR was consulted to unclog J tube with success on 12/30.   On 12/31, the patient was voiding well, tolerating TF's, ambulating well, pain well controlled, vital signs stable, incisions c/d/i and felt stable for discharge home with [facility].  Patient will follow up as outlined below and knows to call with questions or concerns.     I did not take part, nor did I see the patient on the day of discharge. Patient was discharged by Dr. Redmond Pulling. All of the information provided in this discharge summary was taken from the EMR.   Physical Exam: Please see progress note from Dr. Redmond Pulling on 12/31  Allergies as of 04/04/2019   No Known Allergies     Medication List    STOP taking these medications   aspirin 81 MG tablet   famotidine 20 MG tablet Commonly known as: PEPCID Replaced by: famotidine 40 MG/5ML suspension   gabapentin 100 MG capsule Commonly known as: NEURONTIN Replaced by: gabapentin 250 MG/5ML solution   HYDROcodone-acetaminophen 5-325 MG tablet Commonly known as: NORCO/VICODIN   lisinopril 10 MG tablet Commonly known as: ZESTRIL   pantoprazole 40 MG tablet Commonly known as: PROTONIX   polyethylene glycol powder 17 GM/SCOOP powder Commonly known as: GLYCOLAX/MIRALAX   pravastatin  40 MG tablet Commonly known as: PRAVACHOL   sodium-potassium bicarbonate Tbef dissolvable tablet Commonly known as: ALKA-SELTZER GOLD   terazosin 2 MG capsule Commonly known as: HYTRIN     TAKE these medications   acetaminophen 160 MG/5ML solution Commonly known as: TYLENOL Place 20.3 mLs (650 mg total) into feeding tube every 6 (six) hours as needed for mild pain or moderate pain.   docusate 50 MG/5ML liquid Commonly known as: COLACE Place 10 mLs (100 mg total) into feeding tube daily.   famotidine 40 MG/5ML suspension Commonly known as: PEPCID Place 2.5 mLs (20 mg total) into feeding tube daily. Replaces: famotidine 20 MG tablet   feeding supplement (OSMOLITE 1.5 CAL) Liqd Place 1,000 mLs into feeding tube continuous. 60 mL of osmolite1.5 per hour, 200 ml free water TID   free water Soln Place 200 mLs into feeding tube every 8 (eight) hours.   gabapentin 250 MG/5ML solution Commonly known as: NEURONTIN Place 2 mLs (100 mg total) into feeding tube every 12 (twelve) hours. Replaces: gabapentin 100 MG capsule   latanoprost 0.005 % ophthalmic solution Commonly known as: XALATAN Place 1 drop into both eyes at bedtime.   ondansetron 4 MG tablet Commonly known as: Zofran Take 1 tablet (4 mg total) by mouth daily as needed for nausea or vomiting. What changed: when to  take this   Systane Ultra 0.4-0.3 % Soln Generic drug: Polyethyl Glycol-Propyl Glycol Apply 1 drop to eye 3 (three) times daily as needed. Dry eyes     ASK your doctor about these medications   oxyCODONE 5 MG/5ML solution Commonly known as: ROXICODONE Place 5 mLs (5 mg total) into feeding tube every 6 (six) hours as needed for up to 3 days for breakthrough pain. Ask about: Should I take this medication?        Follow-up Information    Greer Pickerel, MD Follow up on 04/11/2019.   Specialty: General Surgery Why: You have an appointment at 3 PM.  Be at the office 30 minutes early for check-in, bring  photo ID and insurance information. Contact information: [address] [place] [address] [phone]        Biagio Borg, MD Follow up.   Specialties: Internal Medicine, Radiology Why: Call and let them know you have had surgery follow-up for other medical issues. Contact information: [place] [address] [phone]        Volanda Napoleon, MD Follow up.   Specialty: Oncology Why: Call for an appointment in 3 weeks. Contact information: [place] [address] [phone]           Signed: [facility] 04/11/2019, 10:26 AM Please see amion for pager for the following: Cristine Polio, & Friday 7:00am - 4:30pm Thursdays 7:00am -11:30am

## 2019-04-12 DIAGNOSIS — Z434 Encounter for attention to other artificial openings of digestive tract: Secondary | ICD-10-CM | POA: Diagnosis not present

## 2019-04-12 DIAGNOSIS — E119 Type 2 diabetes mellitus without complications: Secondary | ICD-10-CM | POA: Diagnosis not present

## 2019-04-12 DIAGNOSIS — K311 Adult hypertrophic pyloric stenosis: Secondary | ICD-10-CM | POA: Diagnosis not present

## 2019-04-12 DIAGNOSIS — C169 Malignant neoplasm of stomach, unspecified: Secondary | ICD-10-CM | POA: Diagnosis not present

## 2019-04-12 DIAGNOSIS — E440 Moderate protein-calorie malnutrition: Secondary | ICD-10-CM | POA: Diagnosis not present

## 2019-04-12 DIAGNOSIS — M6281 Muscle weakness (generalized): Secondary | ICD-10-CM | POA: Diagnosis not present

## 2019-04-12 DIAGNOSIS — Z431 Encounter for attention to gastrostomy: Secondary | ICD-10-CM | POA: Diagnosis not present

## 2019-04-12 DIAGNOSIS — Z48815 Encounter for surgical aftercare following surgery on the digestive system: Secondary | ICD-10-CM | POA: Diagnosis not present

## 2019-04-12 DIAGNOSIS — Z483 Aftercare following surgery for neoplasm: Secondary | ICD-10-CM | POA: Diagnosis not present

## 2019-04-12 DIAGNOSIS — Z9889 Other specified postprocedural states: Secondary | ICD-10-CM | POA: Diagnosis not present

## 2019-04-15 ENCOUNTER — Encounter: Payer: Self-pay | Admitting: Internal Medicine

## 2019-04-15 ENCOUNTER — Telehealth: Payer: Self-pay

## 2019-04-15 DIAGNOSIS — Z9889 Other specified postprocedural states: Secondary | ICD-10-CM | POA: Diagnosis not present

## 2019-04-15 DIAGNOSIS — Z483 Aftercare following surgery for neoplasm: Secondary | ICD-10-CM | POA: Diagnosis not present

## 2019-04-15 DIAGNOSIS — C169 Malignant neoplasm of stomach, unspecified: Secondary | ICD-10-CM | POA: Diagnosis not present

## 2019-04-15 DIAGNOSIS — R770 Abnormality of albumin: Secondary | ICD-10-CM | POA: Diagnosis not present

## 2019-04-15 DIAGNOSIS — Z13228 Encounter for screening for other metabolic disorders: Secondary | ICD-10-CM | POA: Diagnosis not present

## 2019-04-15 DIAGNOSIS — Z434 Encounter for attention to other artificial openings of digestive tract: Secondary | ICD-10-CM | POA: Diagnosis not present

## 2019-04-15 DIAGNOSIS — M6281 Muscle weakness (generalized): Secondary | ICD-10-CM | POA: Diagnosis not present

## 2019-04-15 DIAGNOSIS — Z431 Encounter for attention to gastrostomy: Secondary | ICD-10-CM | POA: Diagnosis not present

## 2019-04-15 DIAGNOSIS — Z48815 Encounter for surgical aftercare following surgery on the digestive system: Secondary | ICD-10-CM | POA: Diagnosis not present

## 2019-04-15 DIAGNOSIS — E119 Type 2 diabetes mellitus without complications: Secondary | ICD-10-CM | POA: Diagnosis not present

## 2019-04-15 DIAGNOSIS — K311 Adult hypertrophic pyloric stenosis: Secondary | ICD-10-CM | POA: Diagnosis not present

## 2019-04-15 NOTE — Telephone Encounter (Signed)
Faxed and confirmed written order from [facility].

## 2019-04-16 DIAGNOSIS — Z48815 Encounter for surgical aftercare following surgery on the digestive system: Secondary | ICD-10-CM | POA: Diagnosis not present

## 2019-04-16 DIAGNOSIS — K311 Adult hypertrophic pyloric stenosis: Secondary | ICD-10-CM | POA: Diagnosis not present

## 2019-04-16 DIAGNOSIS — C169 Malignant neoplasm of stomach, unspecified: Secondary | ICD-10-CM | POA: Diagnosis not present

## 2019-04-16 DIAGNOSIS — Z434 Encounter for attention to other artificial openings of digestive tract: Secondary | ICD-10-CM | POA: Diagnosis not present

## 2019-04-16 DIAGNOSIS — Z483 Aftercare following surgery for neoplasm: Secondary | ICD-10-CM | POA: Diagnosis not present

## 2019-04-16 DIAGNOSIS — Z431 Encounter for attention to gastrostomy: Secondary | ICD-10-CM | POA: Diagnosis not present

## 2019-04-16 DIAGNOSIS — Z9889 Other specified postprocedural states: Secondary | ICD-10-CM | POA: Diagnosis not present

## 2019-04-16 DIAGNOSIS — M6281 Muscle weakness (generalized): Secondary | ICD-10-CM | POA: Diagnosis not present

## 2019-04-16 DIAGNOSIS — E119 Type 2 diabetes mellitus without complications: Secondary | ICD-10-CM | POA: Diagnosis not present

## 2019-04-16 MED FILL — DOCU LIQUID 50 MG/5 ML: 50 | 24 days supply | Qty: 473 | Fill #0

## 2019-04-18 DIAGNOSIS — C169 Malignant neoplasm of stomach, unspecified: Secondary | ICD-10-CM | POA: Diagnosis not present

## 2019-04-18 DIAGNOSIS — K311 Adult hypertrophic pyloric stenosis: Secondary | ICD-10-CM | POA: Diagnosis not present

## 2019-04-18 DIAGNOSIS — Z48815 Encounter for surgical aftercare following surgery on the digestive system: Secondary | ICD-10-CM | POA: Diagnosis not present

## 2019-04-18 DIAGNOSIS — Z9889 Other specified postprocedural states: Secondary | ICD-10-CM | POA: Diagnosis not present

## 2019-04-18 DIAGNOSIS — M6281 Muscle weakness (generalized): Secondary | ICD-10-CM | POA: Diagnosis not present

## 2019-04-18 DIAGNOSIS — Z431 Encounter for attention to gastrostomy: Secondary | ICD-10-CM | POA: Diagnosis not present

## 2019-04-18 DIAGNOSIS — Z483 Aftercare following surgery for neoplasm: Secondary | ICD-10-CM | POA: Diagnosis not present

## 2019-04-18 DIAGNOSIS — E119 Type 2 diabetes mellitus without complications: Secondary | ICD-10-CM | POA: Diagnosis not present

## 2019-04-18 DIAGNOSIS — Z434 Encounter for attention to other artificial openings of digestive tract: Secondary | ICD-10-CM | POA: Diagnosis not present

## 2019-04-19 DIAGNOSIS — Z483 Aftercare following surgery for neoplasm: Secondary | ICD-10-CM | POA: Diagnosis not present

## 2019-04-19 DIAGNOSIS — Z431 Encounter for attention to gastrostomy: Secondary | ICD-10-CM | POA: Diagnosis not present

## 2019-04-19 DIAGNOSIS — E119 Type 2 diabetes mellitus without complications: Secondary | ICD-10-CM | POA: Diagnosis not present

## 2019-04-19 DIAGNOSIS — K311 Adult hypertrophic pyloric stenosis: Secondary | ICD-10-CM | POA: Diagnosis not present

## 2019-04-19 DIAGNOSIS — Z434 Encounter for attention to other artificial openings of digestive tract: Secondary | ICD-10-CM | POA: Diagnosis not present

## 2019-04-19 DIAGNOSIS — Z48815 Encounter for surgical aftercare following surgery on the digestive system: Secondary | ICD-10-CM | POA: Diagnosis not present

## 2019-04-19 DIAGNOSIS — Z9889 Other specified postprocedural states: Secondary | ICD-10-CM | POA: Diagnosis not present

## 2019-04-19 DIAGNOSIS — M6281 Muscle weakness (generalized): Secondary | ICD-10-CM | POA: Diagnosis not present

## 2019-04-19 DIAGNOSIS — C169 Malignant neoplasm of stomach, unspecified: Secondary | ICD-10-CM | POA: Diagnosis not present

## 2019-04-22 DIAGNOSIS — Z434 Encounter for attention to other artificial openings of digestive tract: Secondary | ICD-10-CM | POA: Diagnosis not present

## 2019-04-22 DIAGNOSIS — E119 Type 2 diabetes mellitus without complications: Secondary | ICD-10-CM | POA: Diagnosis not present

## 2019-04-22 DIAGNOSIS — K311 Adult hypertrophic pyloric stenosis: Secondary | ICD-10-CM | POA: Diagnosis not present

## 2019-04-22 DIAGNOSIS — Z48815 Encounter for surgical aftercare following surgery on the digestive system: Secondary | ICD-10-CM | POA: Diagnosis not present

## 2019-04-22 DIAGNOSIS — C169 Malignant neoplasm of stomach, unspecified: Secondary | ICD-10-CM | POA: Diagnosis not present

## 2019-04-22 DIAGNOSIS — Z483 Aftercare following surgery for neoplasm: Secondary | ICD-10-CM | POA: Diagnosis not present

## 2019-04-22 DIAGNOSIS — Z431 Encounter for attention to gastrostomy: Secondary | ICD-10-CM | POA: Diagnosis not present

## 2019-04-22 DIAGNOSIS — M6281 Muscle weakness (generalized): Secondary | ICD-10-CM | POA: Diagnosis not present

## 2019-04-22 DIAGNOSIS — Z9889 Other specified postprocedural states: Secondary | ICD-10-CM | POA: Diagnosis not present

## 2019-04-25 ENCOUNTER — Other Ambulatory Visit: Payer: Self-pay | Admitting: Hematology & Oncology

## 2019-04-25 DIAGNOSIS — Z9889 Other specified postprocedural states: Secondary | ICD-10-CM | POA: Diagnosis not present

## 2019-04-25 DIAGNOSIS — M6281 Muscle weakness (generalized): Secondary | ICD-10-CM | POA: Diagnosis not present

## 2019-04-25 DIAGNOSIS — C169 Malignant neoplasm of stomach, unspecified: Secondary | ICD-10-CM | POA: Diagnosis not present

## 2019-04-25 DIAGNOSIS — K311 Adult hypertrophic pyloric stenosis: Secondary | ICD-10-CM | POA: Diagnosis not present

## 2019-04-25 DIAGNOSIS — Z48815 Encounter for surgical aftercare following surgery on the digestive system: Secondary | ICD-10-CM | POA: Diagnosis not present

## 2019-04-25 DIAGNOSIS — Z434 Encounter for attention to other artificial openings of digestive tract: Secondary | ICD-10-CM | POA: Diagnosis not present

## 2019-04-25 DIAGNOSIS — E119 Type 2 diabetes mellitus without complications: Secondary | ICD-10-CM | POA: Diagnosis not present

## 2019-04-25 DIAGNOSIS — Z431 Encounter for attention to gastrostomy: Secondary | ICD-10-CM | POA: Diagnosis not present

## 2019-04-25 DIAGNOSIS — Z483 Aftercare following surgery for neoplasm: Secondary | ICD-10-CM | POA: Diagnosis not present

## 2019-04-29 ENCOUNTER — Other Ambulatory Visit: Payer: Self-pay | Admitting: Oncology

## 2019-04-29 ENCOUNTER — Encounter: Payer: Self-pay | Admitting: Hematology & Oncology

## 2019-04-29 ENCOUNTER — Inpatient Hospital Stay: Payer: Medicare PPO

## 2019-04-29 ENCOUNTER — Inpatient Hospital Stay: Payer: Medicare PPO | Attending: Hematology & Oncology | Admitting: Hematology & Oncology

## 2019-04-29 ENCOUNTER — Other Ambulatory Visit: Payer: Self-pay

## 2019-04-29 ENCOUNTER — Ambulatory Visit (HOSPITAL_BASED_OUTPATIENT_CLINIC_OR_DEPARTMENT_OTHER)
Admission: RE | Admit: 2019-04-29 | Discharge: 2019-04-29 | Disposition: A | Discharge status: Home / Self Care | Payer: Medicare PPO | Source: Ambulatory Visit | Attending: Hematology & Oncology | Admitting: Hematology & Oncology

## 2019-04-29 VITALS — BP 92/65 | HR 80 | Temp 97.7°F | Resp 18 | Ht 68.0 in | Wt 145.0 lb

## 2019-04-29 DIAGNOSIS — Z7189 Other specified counseling: Secondary | ICD-10-CM | POA: Diagnosis not present

## 2019-04-29 DIAGNOSIS — C169 Malignant neoplasm of stomach, unspecified: Secondary | ICD-10-CM | POA: Insufficient documentation

## 2019-04-29 DIAGNOSIS — C162 Malignant neoplasm of body of stomach: Secondary | ICD-10-CM

## 2019-04-29 DIAGNOSIS — M79661 Pain in right lower leg: Secondary | ICD-10-CM | POA: Diagnosis not present

## 2019-04-29 DIAGNOSIS — Z79899 Other long term (current) drug therapy: Secondary | ICD-10-CM | POA: Insufficient documentation

## 2019-04-29 LAB — URINALYSIS, COMPLETE (UACMP) WITH MICROSCOPIC
Bilirubin Urine: NEGATIVE
Glucose, UA: NEGATIVE mg/dL
Hgb urine dipstick: NEGATIVE
Ketones, ur: NEGATIVE mg/dL
Nitrite: NEGATIVE
Protein, ur: NEGATIVE mg/dL
Specific Gravity, Urine: 1.01 (ref 1.005–1.030)
pH: 7.5 (ref 5.0–8.0)

## 2019-04-29 LAB — CMP (CANCER CENTER ONLY)
ALT: 67 U/L — ABNORMAL HIGH (ref 0–44)
AST: 33 U/L (ref 15–41)
Albumin: 3.6 g/dL (ref 3.5–5.0)
Alkaline Phosphatase: 94 U/L (ref 38–126)
Anion gap: 6 (ref 5–15)
BUN: 20 mg/dL (ref 8–23)
CO2: 35 mmol/L — ABNORMAL HIGH (ref 22–32)
Calcium: 9.3 mg/dL (ref 8.9–10.3)
Chloride: 90 mmol/L — ABNORMAL LOW (ref 98–111)
Creatinine: 0.9 mg/dL (ref 0.61–1.24)
GFR, Est AFR Am: >60 mL/min (ref >60)
GFR, Estimated: >60 mL/min (ref >60)
Glucose, Bld: 99 mg/dL (ref 70–99)
Potassium: 4.1 mmol/L (ref 3.5–5.1)
Sodium: 131 mmol/L — ABNORMAL LOW (ref 135–145)
Total Bilirubin: 0.6 mg/dL (ref 0.3–1.2)
Total Protein: 6.9 g/dL (ref 6.5–8.1)

## 2019-04-29 LAB — CBC WITH DIFFERENTIAL (CANCER CENTER ONLY)
Abs Immature Granulocytes: 0.01 10*3/uL (ref 0.00–0.07)
Basophils Absolute: 0 10*3/uL (ref 0.0–0.1)
Basophils Relative: 1 %
Eosinophils Absolute: 0 10*3/uL (ref 0.0–0.5)
Eosinophils Relative: 1 %
HCT: 34.4 % — ABNORMAL LOW (ref 39.0–52.0)
Hemoglobin: 11.6 g/dL — ABNORMAL LOW (ref 13.0–17.0)
Immature Granulocytes: 0 %
Lymphocytes Relative: 40 %
Lymphs Abs: 1.5 10*3/uL (ref 0.7–4.0)
MCH: 28.2 pg (ref 26.0–34.0)
MCHC: 33.7 g/dL (ref 30.0–36.0)
MCV: 83.5 fL (ref 80.0–100.0)
Monocytes Absolute: 0.5 10*3/uL (ref 0.1–1.0)
Monocytes Relative: 12 %
Neutro Abs: 1.8 10*3/uL (ref 1.7–7.7)
Neutrophils Relative %: 46 %
Platelet Count: 262 10*3/uL (ref 150–400)
RBC: 4.12 MIL/uL — ABNORMAL LOW (ref 4.22–5.81)
RDW: 11.9 % (ref 11.5–15.5)
WBC Count: 3.8 10*3/uL — ABNORMAL LOW (ref 4.0–10.5)
nRBC: 0 % (ref 0.0–0.2)

## 2019-04-29 LAB — SAMPLE TO BLOOD BANK

## 2019-04-29 MED ORDER — HEPARIN SOD (PORK) LOCK FLUSH 100 UNIT/ML IV SOLN
500.0000 [IU] | Freq: Once | INTRAVENOUS | Status: AC
  Administered 2019-04-29: 500 [IU] via INTRAVENOUS
  Filled 2019-04-29: qty 5

## 2019-04-29 MED ORDER — SODIUM CHLORIDE 0.9% FLUSH
10.0000 mL | Freq: Once | INTRAVENOUS | Status: AC
  Administered 2019-04-29: 10 mL
  Filled 2019-04-29: qty 10

## 2019-04-29 NOTE — Patient Instructions (Signed)

## 2019-04-29 NOTE — Progress Notes (Signed)
START ON PATHWAY REGIMEN - Gastroesophageal     A cycle is every 14 days:     Oxaliplatin      Leucovorin      Fluorouracil      Fluorouracil   **Always confirm dose/schedule in your pharmacy ordering system**  Patient Characteristics: Gastric, Adenocarcinoma, Postoperative without Neoadjuvant Therapy (Pathologic Staging), pT3 or Higher or pN+,  Postoperative Therapy Histology: Adenocarcinoma Disease Classification: Gastric Therapeutic Status: Postoperative without Neoadjuvant Therapy (Pathologic Staging) AJCC 8 Stage Grouping: IIIA AJCC N Category: pN2 AJCC T Category: pT3 AJCC M Category: cM0 Intent of Therapy: Curative Intent, Discussed with Patient

## 2019-04-30 ENCOUNTER — Encounter: Payer: Self-pay | Admitting: *Deleted

## 2019-04-30 ENCOUNTER — Telehealth: Payer: Self-pay | Admitting: *Deleted

## 2019-04-30 DIAGNOSIS — Z434 Encounter for attention to other artificial openings of digestive tract: Secondary | ICD-10-CM | POA: Diagnosis not present

## 2019-04-30 DIAGNOSIS — C169 Malignant neoplasm of stomach, unspecified: Secondary | ICD-10-CM | POA: Diagnosis not present

## 2019-04-30 DIAGNOSIS — Z9889 Other specified postprocedural states: Secondary | ICD-10-CM | POA: Diagnosis not present

## 2019-04-30 DIAGNOSIS — M6281 Muscle weakness (generalized): Secondary | ICD-10-CM | POA: Diagnosis not present

## 2019-04-30 DIAGNOSIS — Z48815 Encounter for surgical aftercare following surgery on the digestive system: Secondary | ICD-10-CM | POA: Diagnosis not present

## 2019-04-30 DIAGNOSIS — E119 Type 2 diabetes mellitus without complications: Secondary | ICD-10-CM | POA: Diagnosis not present

## 2019-04-30 DIAGNOSIS — Z483 Aftercare following surgery for neoplasm: Secondary | ICD-10-CM | POA: Diagnosis not present

## 2019-04-30 DIAGNOSIS — Z431 Encounter for attention to gastrostomy: Secondary | ICD-10-CM | POA: Diagnosis not present

## 2019-04-30 DIAGNOSIS — K311 Adult hypertrophic pyloric stenosis: Secondary | ICD-10-CM | POA: Diagnosis not present

## 2019-04-30 LAB — FERRITIN: Ferritin: 181 ng/mL (ref 24–336)

## 2019-04-30 LAB — CEA (IN HOUSE-CHCC): CEA (CHCC-In House): 1.25 ng/mL (ref 0.00–5.00)

## 2019-04-30 LAB — IRON AND TIBC
Iron: 65 ug/dL (ref 42–163)
Saturation Ratios: 20 % (ref 20–55)
TIBC: 319 ug/dL (ref 202–409)
UIBC: 254 ug/dL (ref 117–376)

## 2019-04-30 NOTE — Progress Notes (Signed)
Initial RN Navigator Patient Visit  Name: Jonathan Lewis Date of Referral :  Diagnosis: Malignant neoplasm of body of stomach   I was not in the office when patient came for his new patient appointment, yesterday. Dr Antonieta Pert desk nurse gave patient "Your Patient Navigator" handout which explains my role, areas in which I am able to help, and all the contact information for myself and the office. Also gave patient MD and Navigator business card.   New patient packet given to patient which includes: orientation to office and staff; campus directory; education on My Chart and Advance Directives; and patient centered education on stomach cancer.   Reviewed patient appointment after visit with Dr Marin Olp.  Patient will be starting treatment on 05/14/19  Treatment appointments scheduled Chemo Education scheduled for 05/07/19 Nutrition Consult scheduled for 05/14/19 PET scheduled for 05/10/19  Called and spoke to the patient's wife. Reviewed the appointments including time, date and location. Reviewed the necessary information for the PET scan.   Calendar and PET scan information printed and mailed to patient for reinforcement.   Patient understands all follow up procedures and expectations. They have my number to reach out for any further clarification or additional needs.

## 2019-04-30 NOTE — Telephone Encounter (Signed)
-----   Message from Volanda Napoleon, MD sent at 04/29/2019  4:25 PM EST ----- Call - NO blood clot in the right leg!!  Jonathan Lewis

## 2019-04-30 NOTE — Telephone Encounter (Signed)
Patient notified per order of Dr. Marin Olp that there is "NO blood clot in the right leg!!"  Pt appreciative of call and has no questions at this time.  Pt.'s appointment schedule reviewed with pt.'s wife.  She has no questions at this time.

## 2019-05-02 ENCOUNTER — Encounter: Payer: Self-pay | Admitting: Hematology & Oncology

## 2019-05-03 ENCOUNTER — Encounter: Payer: Self-pay | Admitting: *Deleted

## 2019-05-03 ENCOUNTER — Other Ambulatory Visit: Payer: Self-pay | Admitting: *Deleted

## 2019-05-03 ENCOUNTER — Encounter: Payer: Self-pay | Admitting: Hematology & Oncology

## 2019-05-03 DIAGNOSIS — K311 Adult hypertrophic pyloric stenosis: Secondary | ICD-10-CM | POA: Diagnosis not present

## 2019-05-03 DIAGNOSIS — Z9889 Other specified postprocedural states: Secondary | ICD-10-CM | POA: Diagnosis not present

## 2019-05-03 DIAGNOSIS — Z431 Encounter for attention to gastrostomy: Secondary | ICD-10-CM | POA: Diagnosis not present

## 2019-05-03 DIAGNOSIS — E119 Type 2 diabetes mellitus without complications: Secondary | ICD-10-CM | POA: Diagnosis not present

## 2019-05-03 DIAGNOSIS — M6281 Muscle weakness (generalized): Secondary | ICD-10-CM | POA: Diagnosis not present

## 2019-05-03 DIAGNOSIS — C169 Malignant neoplasm of stomach, unspecified: Secondary | ICD-10-CM | POA: Diagnosis not present

## 2019-05-03 DIAGNOSIS — Z483 Aftercare following surgery for neoplasm: Secondary | ICD-10-CM | POA: Diagnosis not present

## 2019-05-03 DIAGNOSIS — Z48815 Encounter for surgical aftercare following surgery on the digestive system: Secondary | ICD-10-CM | POA: Diagnosis not present

## 2019-05-03 DIAGNOSIS — Z434 Encounter for attention to other artificial openings of digestive tract: Secondary | ICD-10-CM | POA: Diagnosis not present

## 2019-05-03 MED ORDER — GABAPENTIN 250 MG/5ML PO SOLN: 100.0000 mg | Freq: Two times a day (BID) | ORAL | 0 refills | Status: DC

## 2019-05-03 NOTE — Progress Notes (Signed)
See MyChart discussion from 05/03/2019  Called PET at [facility] to confirm that patient needs to stop tube feeds at 5am for his 11am PET. They stated that was sufficient, and they would notate on his chart that patient receives tube feeding.

## 2019-05-03 NOTE — Progress Notes (Signed)
Hematology and Oncology Follow Up Visit  Jonathan Lewis [id number] 1940/04/21 79 y.o. 05/03/2019   Principle Diagnosis:   Stage IIIA (T3N2M0) adenocarcinoma of the stomach --  S/p distal gastrectomy on 03/19/2019  -- HER2-/PD-L1+  Current Therapy:    Adjuvant FOLFOX - cycle 1/4 - start on 05/13/2019     Interim History:  Jonathan Lewis is in for his first office visit.  He really had a tough time in the hospital with respect to his gastric cancer.  He presented with weight loss and nausea vomiting.  He was found to have gastric cancer.  He underwent staging.  All the staging studies were negative for any obvious metastatic disease.  If he underwent a distal gastrectomy on 03/19/2019.  The pathology report ([id number]) showed a invasive adenocarcinoma that is moderately differentiated.  It measured 3 cm.  There is perineural invasion.  All margins were negative.  There were 4+ lymph nodes.  As such, he was pathologically staged at Stage IIIA.  He has a feeding tube in place.  He really had a tough recovery.  He fine was discharged probably about a couple weeks ago.  He still is using the feeding tube.  He also has a another tube in which I suppose is some kind of drainage catheter.  He is really not eating anything solid right now.  He has had no nausea or vomiting.  Has had no bleeding.  There has been no diarrhea.  He comes in with his wife.  Overall, I would say that his performance status is probably ECOG 2.  Medications:  Current Outpatient Medications:  .  docusate (COLACE) 50 MG/5ML liquid, Place 10 mLs (100 mg total) into feeding tube daily., Disp: 100 mL, Rfl: 0 .  gabapentin (NEURONTIN) 250 MG/5ML solution, Place 2 mLs (100 mg total) into feeding tube every 12 (twelve) hours., Disp: 120 mL, Rfl: 0 .  latanoprost (XALATAN) 0.005 % ophthalmic solution, Place 1 drop into both eyes at bedtime. , Disp: , Rfl:  .  Nutritional Supplements (FEEDING SUPPLEMENT, OSMOLITE 1.5 CAL,)  LIQD, Place 1,000 mLs into feeding tube continuous. 60 mL of osmolite1.5 per hour, 200 ml free water TID, Disp: 30000 mL, Rfl: 0 .  SYSTANE ULTRA 0.4-0.3 % SOLN, Apply 1 drop to eye 3 (three) times daily as needed. Dry eyes, Disp: , Rfl:  .  Water For Irrigation, Sterile (FREE WATER) SOLN, Place 200 mLs into feeding tube every 8 (eight) hours., Disp: 1 mL, Rfl: 0 .  acetaminophen (TYLENOL) 160 MG/5ML solution, Place 20.3 mLs (650 mg total) into feeding tube every 6 (six) hours as needed for mild pain or moderate pain. (Patient not taking: Reported on 04/29/2019), Disp: 120 mL, Rfl: 0 .  famotidine (PEPCID) 40 MG/5ML suspension, Place 2.5 mLs (20 mg total) into feeding tube daily. (Patient not taking: Reported on 04/29/2019), Disp: 75 mL, Rfl: 0 .  ondansetron (ZOFRAN) 4 MG tablet, Take 1 tablet (4 mg total) by mouth daily as needed for nausea or vomiting. (Patient not taking: Reported on 04/29/2019), Disp: 30 tablet, Rfl: 1 .  oxyCODONE (ROXICODONE) 5 MG/5ML solution, , Disp: , Rfl:   Allergies: No Known Allergies  Past Medical History, Surgical history, Social history, and Family History were reviewed and updated.  Review of Systems: Review of Systems  Constitutional: Positive for appetite change, fatigue and unexpected weight change.  HENT:  Negative.   Eyes: Negative.   Respiratory: Negative.   Cardiovascular: Negative.   Gastrointestinal: Positive for abdominal  pain, nausea and vomiting.  Endocrine: Negative.   Genitourinary: Negative.    Musculoskeletal: Negative.   Skin: Positive for itching.  Neurological: Negative.   Hematological: Negative.   Psychiatric/Behavioral: Negative.     Physical Exam:  height is _0  (1.727 m) and weight is 145 lb (65.8 kg). His temporal temperature is 97.7 F (36.5 C). His blood pressure is 92/65 and his pulse is 80. His respiration is 18 and oxygen saturation is 100%.   Wt Readings from Last 3 Encounters:  04/29/19 145 lb (65.8 kg)  04/04/19 164  lb 7.4 oz (74.6 kg)  03/05/19 175 lb (79.4 kg)    Physical Exam Vitals reviewed.  HENT:     Head: Normocephalic and atraumatic.  Eyes:     Pupils: Pupils are equal, round, and reactive to light.  Cardiovascular:     Rate and Rhythm: Normal rate and regular rhythm.     Heart sounds: Normal heart sounds.  Pulmonary:     Effort: Pulmonary effort is normal.     Breath sounds: Normal breath sounds.  Abdominal:     General: Bowel sounds are normal.     Palpations: Abdomen is soft.     Comments: Abdominal exam shows healing laparotomy scar.  He does have a couple tubes in the abdomen.  1 is a feeding tube.  I guess the other is some type of drainage catheter.  There is no fluid wave.  There is no abdominal mass.  There is no palpable liver or spleen tip.  Musculoskeletal:        General: No tenderness or deformity. Normal range of motion.     Cervical back: Normal range of motion.  Lymphadenopathy:     Cervical: No cervical adenopathy.  Skin:    General: Skin is warm and dry.     Findings: No erythema or rash.  Neurological:     Mental Status: He is alert and oriented to person, place, and time.  Psychiatric:        Behavior: Behavior normal.        Thought Content: Thought content normal.        Judgment: Judgment normal.      Lab Results  Component Value Date   WBC 3.8 (L) 04/29/2019   HGB 11.6 (L) 04/29/2019   HCT 34.4 (L) 04/29/2019   MCV 83.5 04/29/2019   PLT 262 04/29/2019     Chemistry      Component Value Date/Time   NA 131 (L) 04/29/2019 1400   K 4.1 04/29/2019 1400   CL 90 (L) 04/29/2019 1400   CO2 35 (H) 04/29/2019 1400   BUN 20 04/29/2019 1400   CREATININE 0.90 04/29/2019 1400   CREATININE 1.06 07/16/2012 1038      Component Value Date/Time   CALCIUM 9.3 04/29/2019 1400   ALKPHOS 94 04/29/2019 1400   AST 33 04/29/2019 1400   ALT 67 (H) 04/29/2019 1400   BILITOT 0.6 04/29/2019 1400      Impression and Plan: Jonathan Lewis is a 79 year old  African-American male.  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  I forgot to mention that he had a Port-A-Cath placed while he was in the hospital.  Before he got sick, he actually was quite well.  He was quite active.  I think that he would qualify for adjuvant therapy.  I think without any adjuvant treatment, this cancer will come back.  I think with adjuvant therapy, the risk of his cancer recurrence probably is going  to be about 15-20%.  The fact that there are 4 lymph nodes that are positive are quite troublesome.  I would definitely favor a "sandwich approach".  I will give full dose chemotherapy for 4 cycles with FOLFOX.  I would then "sandwich" in treatment with radiation therapy and Xeloda.  Finally, I would give 4 additional cycles of FOLFOX.  It would be nice if he did get a little bit stronger.  I still think he needs to have some improvement in his performance status before we think about treatment on him.  I went over side effects of treatment with him and his wife.  I gave him information sheets about the FOLFOX protocol.  He has a Port-A-Cath in.  I explained how the 5-FU pump works.  I would have to do dosage reduction on him.  I do still think he would be able to handle full dose treatment right now.  I spent about an hour with he and his wife.  I answered all their questions.  We had a very good prayer session of the end of our meeting.  I gave him a prayer blanket which she was thankful for.  We will start treatment on him on February 8.  I will see him back that day to miss make sure he is doing all right.   Volanda Napoleon, MD 1/29/202111:37 AM

## 2019-05-05 DIAGNOSIS — K311 Adult hypertrophic pyloric stenosis: Secondary | ICD-10-CM | POA: Diagnosis not present

## 2019-05-05 DIAGNOSIS — C169 Malignant neoplasm of stomach, unspecified: Secondary | ICD-10-CM | POA: Diagnosis not present

## 2019-05-05 DIAGNOSIS — Z48815 Encounter for surgical aftercare following surgery on the digestive system: Secondary | ICD-10-CM | POA: Diagnosis not present

## 2019-05-05 DIAGNOSIS — Z434 Encounter for attention to other artificial openings of digestive tract: Secondary | ICD-10-CM | POA: Diagnosis not present

## 2019-05-05 DIAGNOSIS — M6281 Muscle weakness (generalized): Secondary | ICD-10-CM | POA: Diagnosis not present

## 2019-05-05 DIAGNOSIS — Z9889 Other specified postprocedural states: Secondary | ICD-10-CM | POA: Diagnosis not present

## 2019-05-05 DIAGNOSIS — Z431 Encounter for attention to gastrostomy: Secondary | ICD-10-CM | POA: Diagnosis not present

## 2019-05-05 DIAGNOSIS — E119 Type 2 diabetes mellitus without complications: Secondary | ICD-10-CM | POA: Diagnosis not present

## 2019-05-05 DIAGNOSIS — Z483 Aftercare following surgery for neoplasm: Secondary | ICD-10-CM | POA: Diagnosis not present

## 2019-05-06 ENCOUNTER — Other Ambulatory Visit: Payer: Self-pay | Admitting: *Deleted

## 2019-05-06 DIAGNOSIS — E440 Moderate protein-calorie malnutrition: Secondary | ICD-10-CM | POA: Diagnosis not present

## 2019-05-06 DIAGNOSIS — C162 Malignant neoplasm of body of stomach: Secondary | ICD-10-CM

## 2019-05-06 DIAGNOSIS — C169 Malignant neoplasm of stomach, unspecified: Secondary | ICD-10-CM | POA: Diagnosis not present

## 2019-05-06 MED ORDER — LORAZEPAM 2 MG/ML PO CONC: 0.6000 mg | Freq: Four times a day (QID) | ORAL | 0 refills | Status: DC | PRN

## 2019-05-06 MED ORDER — LIDOCAINE-PRILOCAINE 2.5-2.5 % EX CREA: 1.0000 "application " | TOPICAL_CREAM | CUTANEOUS | 3 refills | Status: DC | PRN

## 2019-05-06 MED ORDER — DEXAMETHASONE 1 MG/ML PO CONC: 8.0000 mg | Freq: Every day | ORAL | 3 refills | Status: DC

## 2019-05-06 MED ORDER — ONDANSETRON HCL 4 MG/5ML PO SOLN: ORAL | 3 refills | Status: DC

## 2019-05-07 ENCOUNTER — Encounter: Payer: Self-pay | Admitting: *Deleted

## 2019-05-07 ENCOUNTER — Other Ambulatory Visit: Payer: Self-pay

## 2019-05-07 ENCOUNTER — Inpatient Hospital Stay: Payer: Medicare PPO | Attending: Hematology & Oncology

## 2019-05-07 DIAGNOSIS — C169 Malignant neoplasm of stomach, unspecified: Secondary | ICD-10-CM | POA: Insufficient documentation

## 2019-05-07 DIAGNOSIS — Z79899 Other long term (current) drug therapy: Secondary | ICD-10-CM | POA: Insufficient documentation

## 2019-05-07 DIAGNOSIS — C771 Secondary and unspecified malignant neoplasm of intrathoracic lymph nodes: Secondary | ICD-10-CM | POA: Insufficient documentation

## 2019-05-07 DIAGNOSIS — C162 Malignant neoplasm of body of stomach: Secondary | ICD-10-CM | POA: Diagnosis not present

## 2019-05-07 DIAGNOSIS — Z5111 Encounter for antineoplastic chemotherapy: Secondary | ICD-10-CM | POA: Insufficient documentation

## 2019-05-07 NOTE — Progress Notes (Unsigned)
Patient in chemotherapy education class with wife. Discussed side effects of 5FU, Oxaliplatin, Leucovorin, which include but are not limited to myelosuppression, decreased appetite, fatigue, fever, allergic or infusional reaction, mucositis, cardiac toxicity, cough, SOB, altered taste, nausea and vomiting, diarrhea, constipation, elevated LFTs myalgia and arthralgias, hair loss or thinning, rash, skin dryness, nail changes, peripheral neuropathy, discolored urine, delayed wound healing, mental changes (Chemo brain), increased risk of infections, weight loss.  Reviewed infusion room and office policy and procedure and phone numbers 24 hours x 7 days a week.  Reviewed ambulatory pump specifics and how to manage safe handling at home.  Reviewed when to call the office with any concerns or problems.  Scientist, clinical (histocompatibility and immunogenetics) given.  Discussed portacath insertion and EMLA cream administration.  Antiemetic protocol and chemotherapy schedule reviewed. Patient verbalized understanding of chemotherapy indications and possible side effects.  Teachback done

## 2019-05-10 ENCOUNTER — Other Ambulatory Visit: Payer: Self-pay

## 2019-05-10 ENCOUNTER — Encounter: Payer: Self-pay | Admitting: Hematology & Oncology

## 2019-05-10 ENCOUNTER — Encounter (HOSPITAL_COMMUNITY)
Admission: RE | Admit: 2019-05-10 | Discharge: 2019-05-10 | Disposition: A | Discharge status: Home / Self Care | Payer: Medicare PPO | Source: Ambulatory Visit | Attending: Hematology & Oncology | Admitting: Hematology & Oncology

## 2019-05-10 DIAGNOSIS — Z903 Acquired absence of stomach [part of]: Secondary | ICD-10-CM | POA: Diagnosis not present

## 2019-05-10 DIAGNOSIS — N329 Bladder disorder, unspecified: Secondary | ICD-10-CM | POA: Diagnosis not present

## 2019-05-10 DIAGNOSIS — J8410 Pulmonary fibrosis, unspecified: Secondary | ICD-10-CM | POA: Diagnosis not present

## 2019-05-10 DIAGNOSIS — C162 Malignant neoplasm of body of stomach: Secondary | ICD-10-CM | POA: Diagnosis not present

## 2019-05-10 LAB — GLUCOSE, CAPILLARY: Glucose-Capillary: 104 mg/dL — ABNORMAL HIGH (ref 70–99)

## 2019-05-10 MED ORDER — FLUDEOXYGLUCOSE F - 18 (FDG) INJECTION
7.6000 | Freq: Once | INTRAVENOUS | Status: AC
  Administered 2019-05-10: 7.6 via INTRAVENOUS

## 2019-05-14 ENCOUNTER — Other Ambulatory Visit: Payer: Self-pay

## 2019-05-14 ENCOUNTER — Inpatient Hospital Stay: Payer: Medicare PPO | Admitting: Nutrition

## 2019-05-14 ENCOUNTER — Other Ambulatory Visit: Payer: Self-pay | Admitting: *Deleted

## 2019-05-14 ENCOUNTER — Encounter: Payer: Self-pay | Admitting: Hematology & Oncology

## 2019-05-14 ENCOUNTER — Inpatient Hospital Stay: Payer: Medicare PPO

## 2019-05-14 ENCOUNTER — Inpatient Hospital Stay (HOSPITAL_BASED_OUTPATIENT_CLINIC_OR_DEPARTMENT_OTHER): Payer: Medicare PPO | Admitting: Hematology & Oncology

## 2019-05-14 ENCOUNTER — Encounter: Payer: Self-pay | Admitting: *Deleted

## 2019-05-14 VITALS — Wt 162.4 lb

## 2019-05-14 DIAGNOSIS — C162 Malignant neoplasm of body of stomach: Secondary | ICD-10-CM

## 2019-05-14 DIAGNOSIS — Z7189 Other specified counseling: Secondary | ICD-10-CM

## 2019-05-14 DIAGNOSIS — Z5111 Encounter for antineoplastic chemotherapy: Secondary | ICD-10-CM | POA: Diagnosis not present

## 2019-05-14 DIAGNOSIS — C771 Secondary and unspecified malignant neoplasm of intrathoracic lymph nodes: Secondary | ICD-10-CM | POA: Diagnosis not present

## 2019-05-14 DIAGNOSIS — Z79899 Other long term (current) drug therapy: Secondary | ICD-10-CM | POA: Diagnosis not present

## 2019-05-14 DIAGNOSIS — C169 Malignant neoplasm of stomach, unspecified: Secondary | ICD-10-CM | POA: Diagnosis not present

## 2019-05-14 LAB — CMP (CANCER CENTER ONLY)
ALT: 31 U/L (ref 0–44)
AST: 20 U/L (ref 15–41)
Albumin: 3.2 g/dL — ABNORMAL LOW (ref 3.5–5.0)
Alkaline Phosphatase: 62 U/L (ref 38–126)
Anion gap: 7 (ref 5–15)
BUN: 13 mg/dL (ref 8–23)
CO2: 25 mmol/L (ref 22–32)
Calcium: 8.7 mg/dL — ABNORMAL LOW (ref 8.9–10.3)
Chloride: 106 mmol/L (ref 98–111)
Creatinine: 0.73 mg/dL (ref 0.61–1.24)
GFR, Est AFR Am: >60 mL/min (ref >60)
GFR, Estimated: >60 mL/min (ref >60)
Glucose, Bld: 147 mg/dL — ABNORMAL HIGH (ref 70–99)
Potassium: 4.1 mmol/L (ref 3.5–5.1)
Sodium: 138 mmol/L (ref 135–145)
Total Bilirubin: 0.4 mg/dL (ref 0.3–1.2)
Total Protein: 5.9 g/dL — ABNORMAL LOW (ref 6.5–8.1)

## 2019-05-14 LAB — CBC WITH DIFFERENTIAL (CANCER CENTER ONLY)
Abs Immature Granulocytes: 0.01 10*3/uL (ref 0.00–0.07)
Basophils Absolute: 0 10*3/uL (ref 0.0–0.1)
Basophils Relative: 1 %
Eosinophils Absolute: 0.1 10*3/uL (ref 0.0–0.5)
Eosinophils Relative: 1 %
HCT: 30.9 % — ABNORMAL LOW (ref 39.0–52.0)
Hemoglobin: 10.1 g/dL — ABNORMAL LOW (ref 13.0–17.0)
Immature Granulocytes: 0 %
Lymphocytes Relative: 41 %
Lymphs Abs: 1.6 10*3/uL (ref 0.7–4.0)
MCH: 28 pg (ref 26.0–34.0)
MCHC: 32.7 g/dL (ref 30.0–36.0)
MCV: 85.6 fL (ref 80.0–100.0)
Monocytes Absolute: 0.4 10*3/uL (ref 0.1–1.0)
Monocytes Relative: 10 %
Neutro Abs: 1.8 10*3/uL (ref 1.7–7.7)
Neutrophils Relative %: 47 %
Platelet Count: 229 10*3/uL (ref 150–400)
RBC: 3.61 MIL/uL — ABNORMAL LOW (ref 4.22–5.81)
RDW: 14.2 % (ref 11.5–15.5)
WBC Count: 3.8 10*3/uL — ABNORMAL LOW (ref 4.0–10.5)
nRBC: 0 % (ref 0.0–0.2)

## 2019-05-14 MED ORDER — DEXTROSE 5 % IV SOLN
Freq: Once | INTRAVENOUS | Status: AC
  Filled 2019-05-14: qty 250

## 2019-05-14 MED ORDER — LEUCOVORIN CALCIUM INJECTION 350 MG
400.0000 mg/m2 | Freq: Once | INTRAVENOUS | Status: AC
  Administered 2019-05-14: 712 mg via INTRAVENOUS
  Filled 2019-05-14: qty 35.6

## 2019-05-14 MED ORDER — DEXAMETHASONE SODIUM PHOSPHATE 10 MG/ML IJ SOLN
10.0000 mg | Freq: Once | INTRAMUSCULAR | Status: AC
  Administered 2019-05-14: 10 mg via INTRAVENOUS

## 2019-05-14 MED ORDER — LORAZEPAM 0.5 MG PO TABS: 0.5000 mg | ORAL_TABLET | Freq: Three times a day (TID) | ORAL | 0 refills | Status: DC

## 2019-05-14 MED ORDER — SODIUM CHLORIDE 0.9 % IV SOLN
1920.0000 mg/m2 | INTRAVENOUS | Status: DC
  Administered 2019-05-14: 3400 mg via INTRAVENOUS
  Filled 2019-05-14: qty 68

## 2019-05-14 MED ORDER — FLUOROURACIL CHEMO INJECTION 2.5 GM/50ML
320.0000 mg/m2 | Freq: Once | INTRAVENOUS | Status: AC
  Administered 2019-05-14: 550 mg via INTRAVENOUS
  Filled 2019-05-14: qty 11

## 2019-05-14 MED ORDER — OXALIPLATIN CHEMO INJECTION 100 MG/20ML
68.0000 mg/m2 | Freq: Once | INTRAVENOUS | Status: AC
  Administered 2019-05-14: 120 mg via INTRAVENOUS
  Filled 2019-05-14: qty 10

## 2019-05-14 MED ORDER — DEXAMETHASONE SODIUM PHOSPHATE 10 MG/ML IJ SOLN
INTRAMUSCULAR | Status: AC
  Filled 2019-05-14: qty 1

## 2019-05-14 MED ORDER — PALONOSETRON HCL INJECTION 0.25 MG/5ML
0.2500 mg | Freq: Once | INTRAVENOUS | Status: AC
  Administered 2019-05-14: 0.25 mg via INTRAVENOUS

## 2019-05-14 MED ORDER — PALONOSETRON HCL INJECTION 0.25 MG/5ML
INTRAVENOUS | Status: AC
  Filled 2019-05-14: qty 5

## 2019-05-14 NOTE — Patient Instructions (Signed)
[facility] Discharge Instructions for Patients Receiving Chemotherapy  Today you received the following chemotherapy agent Oxaliplatin, Leucovorin and 5FU.   To help prevent nausea and vomiting after your treatment, we encourage you to take your nausea medication as ordered per MD.    If you develop nausea and vomiting that is not controlled by your nausea medication, call the clinic.   BELOW ARE SYMPTOMS THAT SHOULD BE REPORTED IMMEDIATELY:  *FEVER GREATER THAN 100.5 F  *CHILLS WITH OR WITHOUT FEVER  NAUSEA AND VOMITING THAT IS NOT CONTROLLED WITH YOUR NAUSEA MEDICATION  *UNUSUAL SHORTNESS OF BREATH  *UNUSUAL BRUISING OR BLEEDING  TENDERNESS IN MOUTH AND THROAT WITH OR WITHOUT PRESENCE OF ULCERS  *URINARY PROBLEMS  *BOWEL PROBLEMS  UNUSUAL RASH Items with * indicate a potential emergency and should be followed up as soon as possible.  Feel free to call the clinic should you have any questions or concerns. The clinic phone number is [phone].  Please show the [facility] at check-in to the Emergency Department and triage nurse.

## 2019-05-14 NOTE — Progress Notes (Signed)
79 year old male diagnosed with Gastric Cancer s/p distal gastrectomy on 03/19/19 receiving chemotherapy (FOLFOX) He is a patient of Dr. Marin Olp.  PMH includes HTN, HLD, DM, and BPH.  Medications include Decadron, Colace, Pepcid, Ativan, and Zofran.  Labs include Glucose 147 and Alb 3.2 on Feb 9.  Height: 5'8". Weight: 162.4 pounds. UBW: 192 pounds in Feb 2020. BMI: 24.69  Patient reports his surgeon told him to reduce nocturnal feedings via J-tube to 2 bottles Osmolite 1.5 daily.  2 bottles Osmolite 1.5 provides 710 kcal and 29.8 grams protein. His G-tube is clamped and patient denies nutrition impact symptoms. He is advancing his diet and tolerates chicken, fish, potatoes, vegetables, grits and eggs. He is drinking gatorade and pedialyte. Patient is open to trying oral nutrition supplements.  Estimated Nutrition Needs: 2000-2200 kcal, 97-110 gm protein, 2.2 L fluid.  Nutrition Diagnosis:  Inadequate oral intake  related to gastric cancer and associated treatments as evidenced by 15 % weight loss from usual body weight, cancer dx and need for feeding tube.  Intervention: Educated on advancing soft diet including small frequent meals and snacks.  Encouraged high protein foods 6 times daily. Recommended oral nutrition supplements to add extra calories and protein so TF could be discontinued. Provided samples, coupons, contact information and answered questions.  Monitoring, Evaluation, Goals: Patient will tolerate oral intake to promote repletion of lean body mass and healing.  Next Visit: Tuesday, Feb 23, during infusion.

## 2019-05-14 NOTE — Progress Notes (Signed)
Hematology and Oncology Follow Up Visit  ADMIR CANDELAS [id number] 06-Sep-1940 79 y.o. 05/14/2019   Principle Diagnosis:   Stage IIIA (T3N2M0) adenocarcinoma of the stomach --  S/p distal gastrectomy on 03/19/2019  -- HER2-/PD-L1+  Current Therapy:    Adjuvant FOLFOX - cycle 1/4 - start on 05/13/2019     Interim History:  Mr. Prosch is in for his follow-up.  He is to get his first cycle of chemotherapy today.  The problem that we may have now is that he had a PET scan that was done.  The PET scan was done on 05/10/2019.  The PET scan showed hypermetabolic mediastinal and right hilar lymph nodes.  The lungs look okay.  There is no activity in the abdomen or pelvis.  I find it hard to believe that he would have metastatic disease to his chest.  The CEA level was normal.  By looking at the PET scan, I does wonder if this might not be sarcoidosis.  We are going to have to get some kind of tissue confirmation as to what this metabolic lymph nodes are.  I will see if pulmonary medicine can do a bronchoscopy with EBUS and see if they can get this tissue.  I do think it changes how we have to treat him systemically.  I would plan for upfront chemotherapy with FOLFOX and then do radiation with Xeloda as a stage II therapy.  He is gaining weight.  He is eating a little bit better.  He saw his surgeon a week ago.  He still has the G-tube in and feeding tube in.  He is going to the bathroom.  He is not having diarrhea.  There is no bleeding.  He has had no leg swelling.  He has had no headache.  Overall, I would say his performance status is probably ECOG 1-2.    Medications:  Current Outpatient Medications:  .  acetaminophen (TYLENOL) 160 MG/5ML solution, Place 20.3 mLs (650 mg total) into feeding tube every 6 (six) hours as needed for mild pain or moderate pain. (Patient not taking: Reported on 04/29/2019), Disp: 120 mL, Rfl: 0 .  dexamethasone (DECADRON) 1 MG/ML solution, Place 8 mLs (8  mg total) into feeding tube daily. Start day after chemotherapy for 2 days total., Disp: 30 mL, Rfl: 3 .  docusate (COLACE) 50 MG/5ML liquid, Place 10 mLs (100 mg total) into feeding tube daily., Disp: 100 mL, Rfl: 0 .  famotidine (PEPCID) 40 MG/5ML suspension, Place 2.5 mLs (20 mg total) into feeding tube daily. (Patient not taking: Reported on 04/29/2019), Disp: 75 mL, Rfl: 0 .  gabapentin (NEURONTIN) 250 MG/5ML solution, Place 2 mLs (100 mg total) into feeding tube every 12 (twelve) hours., Disp: 120 mL, Rfl: 0 .  latanoprost (XALATAN) 0.005 % ophthalmic solution, Place 1 drop into both eyes at bedtime. , Disp: , Rfl:  .  lidocaine-prilocaine (EMLA) cream, Apply 1 application topically as needed. Place on the port one hour before appointment., Disp: 30 g, Rfl: 3 .  LORazepam (LORAZEPAM INTENSOL) 2 MG/ML concentrated solution, Place 0.3 mLs (0.6 mg total) into feeding tube every 6 (six) hours as needed for anxiety., Disp: 10 mL, Rfl: 0 .  ondansetron (ZOFRAN) 4 MG/5ML solution, Place 9ms (813m into tube every 12 hours as needed for nausea/vomiting. Start 3 days after chemotherapy., Disp: 100 mL, Rfl: 3 .  oxyCODONE (ROXICODONE) 5 MG/5ML solution, , Disp: , Rfl:  .  SYSTANE ULTRA 0.4-0.3 % SOLN, Apply 1 drop  to eye 3 (three) times daily as needed. Dry eyes, Disp: , Rfl:   Allergies: No Known Allergies  Past Medical History, Surgical history, Social history, and Family History were reviewed and updated.  Review of Systems: Review of Systems  Constitutional: Positive for appetite change, fatigue and unexpected weight change.  HENT:  Negative.   Eyes: Negative.   Respiratory: Negative.   Cardiovascular: Negative.   Gastrointestinal: Positive for abdominal pain, nausea and vomiting.  Endocrine: Negative.   Genitourinary: Negative.    Musculoskeletal: Negative.   Skin: Positive for itching.  Neurological: Negative.   Hematological: Negative.   Psychiatric/Behavioral: Negative.      Physical Exam:  weight is 162 lb 6.4 oz (73.7 kg).   Wt Readings from Last 3 Encounters:  05/14/19 162 lb 6.4 oz (73.7 kg)  04/29/19 145 lb (65.8 kg)  04/04/19 164 lb 7.4 oz (74.6 kg)    Physical Exam Vitals reviewed.  HENT:     Head: Normocephalic and atraumatic.  Eyes:     Pupils: Pupils are equal, round, and reactive to light.  Cardiovascular:     Rate and Rhythm: Normal rate and regular rhythm.     Heart sounds: Normal heart sounds.  Pulmonary:     Effort: Pulmonary effort is normal.     Breath sounds: Normal breath sounds.  Abdominal:     General: Bowel sounds are normal.     Palpations: Abdomen is soft.     Comments: Abdominal exam shows healing laparotomy scar.  He does have a couple tubes in the abdomen.  1 is a feeding tube.  I guess the other is some type of drainage catheter.  There is no fluid wave.  There is no abdominal mass.  There is no palpable liver or spleen tip.  Musculoskeletal:        General: No tenderness or deformity. Normal range of motion.     Cervical back: Normal range of motion.  Lymphadenopathy:     Cervical: No cervical adenopathy.  Skin:    General: Skin is warm and dry.     Findings: No erythema or rash.  Neurological:     Mental Status: He is alert and oriented to person, place, and time.  Psychiatric:        Behavior: Behavior normal.        Thought Content: Thought content normal.        Judgment: Judgment normal.      Lab Results  Component Value Date   WBC 3.8 (L) 05/14/2019   HGB 10.1 (L) 05/14/2019   HCT 30.9 (L) 05/14/2019   MCV 85.6 05/14/2019   PLT 229 05/14/2019     Chemistry      Component Value Date/Time   NA 138 05/14/2019 0830   K 4.1 05/14/2019 0830   CL 106 05/14/2019 0830   CO2 25 05/14/2019 0830   BUN 13 05/14/2019 0830   CREATININE 0.73 05/14/2019 0830   CREATININE 1.06 07/16/2012 1038      Component Value Date/Time   CALCIUM 8.7 (L) 05/14/2019 0830   ALKPHOS 62 05/14/2019 0830   AST 20  05/14/2019 0830   ALT 31 05/14/2019 0830   BILITOT 0.4 05/14/2019 0830      Impression and Plan: Mr. Stults is a 79 year old African-American male.  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  Again, of the issue right now is whether or not he does have metastatic disease.  Again I would find it hard to believe that he has  metastatic disease.  I realize that he had positive lymph nodes when he had surgery to resect out the gastric mass.  Again, I wonder if this may not be sarcoidosis.  We will go ahead with those first cycle of chemotherapy.  I am going to dose reduce the treatment a little bit.  Hopefully, he will be able to tolerate treatment.  I had to spend about 35 or so minutes with him.  We had to talk about the PET scan report.  I did explain to him why we needed to see about pulmonary medicine helping [place] out.  As always, he and his wife are incredibly intelligent and are certainly on top of the situation.  I answered their questions.  I will try to get him back in 3 weeks and will see how he is doing.  We will try for his second cycle of chemotherapy in 3 weeks.  By then, I would hope that he would have had his bronchoscopy, if pulmonary medicine feels this is appropriate.      Volanda Napoleon, MD 2/9/20219:08 AM

## 2019-05-15 ENCOUNTER — Other Ambulatory Visit: Payer: Self-pay | Admitting: Family

## 2019-05-15 LAB — URINE CULTURE: Culture: NO GROWTH

## 2019-05-16 ENCOUNTER — Other Ambulatory Visit: Payer: Self-pay

## 2019-05-16 ENCOUNTER — Inpatient Hospital Stay: Payer: Medicare PPO

## 2019-05-16 ENCOUNTER — Ambulatory Visit: Payer: Medicare Other | Admitting: Internal Medicine

## 2019-05-16 VITALS — BP 105/67 | HR 70 | Temp 97.8°F | Resp 16

## 2019-05-16 DIAGNOSIS — Z79899 Other long term (current) drug therapy: Secondary | ICD-10-CM | POA: Diagnosis not present

## 2019-05-16 DIAGNOSIS — C162 Malignant neoplasm of body of stomach: Secondary | ICD-10-CM

## 2019-05-16 DIAGNOSIS — Z5111 Encounter for antineoplastic chemotherapy: Secondary | ICD-10-CM | POA: Diagnosis not present

## 2019-05-16 DIAGNOSIS — C169 Malignant neoplasm of stomach, unspecified: Secondary | ICD-10-CM | POA: Diagnosis not present

## 2019-05-16 DIAGNOSIS — C771 Secondary and unspecified malignant neoplasm of intrathoracic lymph nodes: Secondary | ICD-10-CM | POA: Diagnosis not present

## 2019-05-16 MED ORDER — HEPARIN SOD (PORK) LOCK FLUSH 100 UNIT/ML IV SOLN
500.0000 [IU] | Freq: Once | INTRAVENOUS | Status: AC | PRN
  Administered 2019-05-16: 500 [IU]
  Filled 2019-05-16: qty 5

## 2019-05-16 MED ORDER — SODIUM CHLORIDE 0.9% FLUSH
10.0000 mL | INTRAVENOUS | Status: DC | PRN
  Administered 2019-05-16: 10 mL
  Filled 2019-05-16: qty 10

## 2019-05-16 NOTE — Patient Instructions (Signed)
Fluorouracil, 5-FU injection What is this medicine? FLUOROURACIL, 5-FU (flure oh YOOR a sil) is a chemotherapy drug. It slows the growth of cancer cells. This medicine is used to treat many types of cancer like breast cancer, colon or rectal cancer, pancreatic cancer, and stomach cancer. This medicine may be used for other purposes; ask your health care provider or pharmacist if you have questions. COMMON BRAND NAME(S): Adrucil What should I tell my health care provider before I take this medicine? They need to know if you have any of these conditions:  blood disorders  dihydropyrimidine dehydrogenase (DPD) deficiency  infection (especially a virus infection such as chickenpox, cold sores, or herpes)  kidney disease  liver disease  malnourished, poor nutrition  recent or ongoing radiation therapy  an unusual or allergic reaction to fluorouracil, other chemotherapy, other medicines, foods, dyes, or preservatives  pregnant or trying to get pregnant  breast-feeding How should I use this medicine? This drug is given as an infusion or injection into a vein. It is administered in a hospital or clinic by a specially trained health care professional. Talk to your pediatrician regarding the use of this medicine in children. Special care may be needed. Overdosage: If you think you have taken too much of this medicine contact a poison control center or emergency room at once. NOTE: This medicine is only for you. Do not share this medicine with others. What if I miss a dose? It is important not to miss your dose. Call your doctor or health care professional if you are unable to keep an appointment. What may interact with this medicine?  allopurinol  cimetidine  dapsone  digoxin  hydroxyurea  leucovorin  levamisole  medicines for seizures like ethotoin, fosphenytoin, phenytoin  medicines to increase blood counts like filgrastim, pegfilgrastim, sargramostim  medicines that  treat or prevent blood clots like warfarin, enoxaparin, and dalteparin  methotrexate  metronidazole  pyrimethamine  some other chemotherapy drugs like busulfan, cisplatin, estramustine, vinblastine  trimethoprim  trimetrexate  vaccines Talk to your doctor or health care professional before taking any of these medicines:  acetaminophen  aspirin  ibuprofen  ketoprofen  naproxen This list may not describe all possible interactions. Give your health care provider a list of all the medicines, herbs, non-prescription drugs, or dietary supplements you use. Also tell them if you smoke, drink alcohol, or use illegal drugs. Some items may interact with your medicine. What should I watch for while using this medicine? Visit your doctor for checks on your progress. This drug may make you feel generally unwell. This is not uncommon, as chemotherapy can affect healthy cells as well as cancer cells. Report any side effects. Continue your course of treatment even though you feel ill unless your doctor tells you to stop. In some cases, you may be given additional medicines to help with side effects. Follow all directions for their use. Call your doctor or health care professional for advice if you get a fever, chills or sore throat, or other symptoms of a cold or flu. Do not treat yourself. This drug decreases your body's ability to fight infections. Try to avoid being around people who are sick. This medicine may increase your risk to bruise or bleed. Call your doctor or health care professional if you notice any unusual bleeding. Be careful brushing and flossing your teeth or using a toothpick because you may get an infection or bleed more easily. If you have any dental work done, tell your dentist you are   receiving this medicine. Avoid taking products that contain aspirin, acetaminophen, ibuprofen, naproxen, or ketoprofen unless instructed by your doctor. These medicines may hide a fever. Do not  become pregnant while taking this medicine. Women should inform their doctor if they wish to become pregnant or think they might be pregnant. There is a potential for serious side effects to an unborn child. Talk to your health care professional or pharmacist for more information. Do not breast-feed an infant while taking this medicine. Men should inform their doctor if they wish to father a child. This medicine may lower sperm counts. Do not treat diarrhea with over the counter products. Contact your doctor if you have diarrhea that lasts more than 2 days or if it is severe and watery. This medicine can make you more sensitive to the sun. Keep out of the sun. If you cannot avoid being in the sun, wear protective clothing and use sunscreen. Do not use sun lamps or tanning beds/booths. What side effects may I notice from receiving this medicine? Side effects that you should report to your doctor or health care professional as soon as possible:  allergic reactions like skin rash, itching or hives, swelling of the face, lips, or tongue  low blood counts - this medicine may decrease the number of white blood cells, red blood cells and platelets. You may be at increased risk for infections and bleeding.  signs of infection - fever or chills, cough, sore throat, pain or difficulty passing urine  signs of decreased platelets or bleeding - bruising, pinpoint red spots on the skin, black, tarry stools, blood in the urine  signs of decreased red blood cells - unusually weak or tired, fainting spells, lightheadedness  breathing problems  changes in vision  chest pain  mouth sores  nausea and vomiting  pain, swelling, redness at site where injected  pain, tingling, numbness in the hands or feet  redness, swelling, or sores on hands or feet  stomach pain  unusual bleeding Side effects that usually do not require medical attention (report to your doctor or health care professional if they  continue or are bothersome):  changes in finger or toe nails  diarrhea  dry or itchy skin  hair loss  headache  loss of appetite  sensitivity of eyes to the light  stomach upset  unusually teary eyes This list may not describe all possible side effects. Call your doctor for medical advice about side effects. You may report side effects to FDA at 1-800-FDA-1088. Where should I keep my medicine? This drug is given in a hospital or clinic and will not be stored at home. NOTE: This sheet is a summary. It may not cover all possible information. If you have questions about this medicine, talk to your doctor, pharmacist, or health care provider.  2020 Elsevier/Gold Standard (2007-07-25 13:53:16)  

## 2019-05-17 DIAGNOSIS — K311 Adult hypertrophic pyloric stenosis: Secondary | ICD-10-CM | POA: Diagnosis not present

## 2019-05-17 DIAGNOSIS — C169 Malignant neoplasm of stomach, unspecified: Secondary | ICD-10-CM | POA: Diagnosis not present

## 2019-05-17 DIAGNOSIS — M6281 Muscle weakness (generalized): Secondary | ICD-10-CM | POA: Diagnosis not present

## 2019-05-17 DIAGNOSIS — Z9889 Other specified postprocedural states: Secondary | ICD-10-CM | POA: Diagnosis not present

## 2019-05-17 DIAGNOSIS — Z483 Aftercare following surgery for neoplasm: Secondary | ICD-10-CM | POA: Diagnosis not present

## 2019-05-17 DIAGNOSIS — Z48815 Encounter for surgical aftercare following surgery on the digestive system: Secondary | ICD-10-CM | POA: Diagnosis not present

## 2019-05-17 DIAGNOSIS — Z434 Encounter for attention to other artificial openings of digestive tract: Secondary | ICD-10-CM | POA: Diagnosis not present

## 2019-05-17 DIAGNOSIS — Z431 Encounter for attention to gastrostomy: Secondary | ICD-10-CM | POA: Diagnosis not present

## 2019-05-17 DIAGNOSIS — E119 Type 2 diabetes mellitus without complications: Secondary | ICD-10-CM | POA: Diagnosis not present

## 2019-05-20 ENCOUNTER — Telehealth: Payer: Self-pay | Admitting: *Deleted

## 2019-05-20 ENCOUNTER — Encounter: Payer: Self-pay | Admitting: Hematology & Oncology

## 2019-05-20 ENCOUNTER — Other Ambulatory Visit (HOSPITAL_COMMUNITY)
Admission: RE | Admit: 2019-05-20 | Discharge: 2019-05-20 | Disposition: A | Discharge status: Home / Self Care | Payer: Medicare PPO | Source: Ambulatory Visit | Attending: Pulmonary Disease | Admitting: Pulmonary Disease

## 2019-05-20 ENCOUNTER — Telehealth: Payer: Self-pay | Admitting: Pulmonary Disease

## 2019-05-20 DIAGNOSIS — Z20822 Contact with and (suspected) exposure to covid-19: Secondary | ICD-10-CM | POA: Insufficient documentation

## 2019-05-20 DIAGNOSIS — Z01812 Encounter for preprocedural laboratory examination: Secondary | ICD-10-CM | POA: Insufficient documentation

## 2019-05-20 DIAGNOSIS — C169 Malignant neoplasm of stomach, unspecified: Secondary | ICD-10-CM

## 2019-05-20 DIAGNOSIS — C799 Secondary malignant neoplasm of unspecified site: Secondary | ICD-10-CM

## 2019-05-20 LAB — SARS CORONAVIRUS 2 (TAT 6-24 HRS): SARS Coronavirus 2: NEGATIVE

## 2019-05-20 NOTE — Telephone Encounter (Signed)
ATC patient unable to reach unable to leave VM, needs Covid test scheduled for EBUS needs to go Today for procedure on Wednesday 05/22/19

## 2019-05-20 NOTE — Telephone Encounter (Signed)
-----   Message from Garner Nash, DO sent at 05/20/2019  9:59 AM EST ----- Regarding: RE: EBUS Referral Ok sounds good thanks Sorry I did not enter an epic order. I apologize  Jonathan Lewis  ----- Message ----- From: Amado Coe, RN Sent: 05/20/2019   9:53 AM EST To: Collene Gobble, MD, Garner Nash, DO, # Subject: RE: EBUS Referral                              I didn't see the dot phrase. So I got this patient scheduled for Wednesday at 11 or 12, in [place] [facility]. I used Dr. Valeta Harms as MD.  Please let me know what I need to do further.  ----- Message ----- From: Garner Nash, DO Sent: 05/17/2019   8:56 PM EST To: Amado Coe, RN, Collene Gobble, MD, # Subject: EBUS Referral                                  Rob/Dan,   This is a gentleman that Ennever messaged me about. He needs an EBUS for possible gastric mets to mediastinum. I could do his case next week as I am on the pulmonary floor.   Denaja Verhoeven,   can we get an add-on for the OR mid-week. I can do his consultation the day of procedure. I didn't see any red flags. Could we add to follow case in OR on Wednesday?  Procedure: EBUS  I think ENDO is full on Tuesday? And I doubt we can arrange.   Final option would be to schedule with me in ENDO on Tuesday 23rd as last resort  Thanks  Jonathan Lewis    ----- Message ----- From: Volanda Napoleon, MD Sent: 05/14/2019   1:04 PM EST To: Garner Nash, DO  Brad:  can you possible see this man and do an EUS on him??  He has stage III gastric cancer, but a PET scan shows "hot" nodes in the mediastinum and hilum.  I find it hard to believe that he has mets already.  I think this may be sarcoid??  I really need a tissue dx as this will dramatically change his treatment protocol and his prognosis if he has thoracic mets.  Thanks for the assistance!!  I will be praying about this!!  Laurey Arrow

## 2019-05-20 NOTE — Telephone Encounter (Signed)
-----   Message from Garner Nash, DO sent at 05/17/2019  8:56 PM EST ----- Regarding: EBUS Referral Rob/Dan,   This is a gentleman that Ennever messaged me about. He needs an EBUS for possible gastric mets to mediastinum. I could do his case next week as I am on the pulmonary floor.   Amen Staszak,   can we get an add-on for the OR mid-week. I can do his consultation the day of procedure. I didn't see any red flags. Could we add to follow case in OR on Wednesday?  Procedure: EBUS  I think ENDO is full on Tuesday? And I doubt we can arrange.   Final option would be to schedule with me in ENDO on Tuesday 23rd as last resort  Thanks  Leory Plowman    ----- Message ----- From: Volanda Napoleon, MD Sent: 05/14/2019   1:04 PM EST To: Garner Nash, DO  Brad:  can you possible see this man and do an EUS on him??  He has stage III gastric cancer, but a PET scan shows "hot" nodes in the mediastinum and hilum.  I find it hard to believe that he has mets already.  I think this may be sarcoid??  I really need a tissue dx as this will dramatically change his treatment protocol and his prognosis if he has thoracic mets.  Thanks for the assistance!!  I will be praying about this!!  Laurey Arrow

## 2019-05-20 NOTE — Telephone Encounter (Signed)
ATC home number- unable to leave msg, called cell and LMTCB

## 2019-05-20 NOTE — Telephone Encounter (Signed)
Patient scheduled for covid and for procedure- holly contacted patient scheduled for covid today 05/20/19

## 2019-05-20 NOTE — Telephone Encounter (Signed)
Called and spoke with pt's granddaughter Ellwood Dense letting her know pt's appt time for the bronch Wed. 2/17. Nothing further needed.

## 2019-05-21 ENCOUNTER — Telehealth: Payer: Self-pay | Admitting: Pulmonary Disease

## 2019-05-21 ENCOUNTER — Other Ambulatory Visit: Payer: Self-pay

## 2019-05-21 ENCOUNTER — Encounter (HOSPITAL_COMMUNITY): Payer: Self-pay | Admitting: Pulmonary Disease

## 2019-05-21 NOTE — Telephone Encounter (Signed)
Spoke with pt's wife, Pamala Hurry and his son, Lennette Bihari. They are aware of the pt's bronch information. COVID test was done on 2/15 and is negative. Nothing further was needed.

## 2019-05-21 NOTE — Progress Notes (Addendum)
I spoke to Mr and Mrs Bota to update history. Mrs Gilleo took notes. Mr Goren denies chest pain or shortness of breath. Mr Ruszczyk tested negative for Covid on 05/20/2019, he has been in quarantine with his family since that time.  Mr Blima Singer has a Gastric jejunostomy  feedind tube. Patient is eating by mouth now, he gets 2 cans of Osmolite between 12200 and 0300, I can instruction that that feeding should be stopped by midnight.  Mr. Topf has a history of HTN, he is no longer on medication for  HTN.  Mr Bernau has type II diabetes, he has taken Daggett in the past, but is no longer taking it. Patient has stopped checking CBG, I asked patient to check it in am and if it is less than 70 to drink 4 oz/ 1/2 cup of grape or cranberry juice; then check CBG again in 15 minutes, if it is not increasing , call the pre- op desk. I did not find an A1C in any records in Epic.

## 2019-05-22 ENCOUNTER — Ambulatory Visit (HOSPITAL_COMMUNITY): Payer: Medicare PPO | Admitting: Anesthesiology

## 2019-05-22 ENCOUNTER — Encounter (HOSPITAL_COMMUNITY)
Admission: RE | Disposition: A | Discharge status: Home / Self Care | Payer: Self-pay | Source: Home / Self Care | Attending: Pulmonary Disease

## 2019-05-22 ENCOUNTER — Encounter (HOSPITAL_COMMUNITY): Payer: Self-pay | Admitting: Pulmonary Disease

## 2019-05-22 ENCOUNTER — Ambulatory Visit (HOSPITAL_COMMUNITY)
Admission: RE | Admit: 2019-05-22 | Discharge: 2019-05-22 | Disposition: A | Discharge status: Home / Self Care | Payer: Medicare PPO | Attending: Pulmonary Disease | Admitting: Pulmonary Disease

## 2019-05-22 ENCOUNTER — Telehealth: Payer: Self-pay | Admitting: Pulmonary Disease

## 2019-05-22 DIAGNOSIS — H409 Unspecified glaucoma: Secondary | ICD-10-CM | POA: Insufficient documentation

## 2019-05-22 DIAGNOSIS — Z934 Other artificial openings of gastrointestinal tract status: Secondary | ICD-10-CM | POA: Diagnosis not present

## 2019-05-22 DIAGNOSIS — I10 Essential (primary) hypertension: Secondary | ICD-10-CM | POA: Insufficient documentation

## 2019-05-22 DIAGNOSIS — R942 Abnormal results of pulmonary function studies: Secondary | ICD-10-CM | POA: Diagnosis not present

## 2019-05-22 DIAGNOSIS — E119 Type 2 diabetes mellitus without complications: Secondary | ICD-10-CM | POA: Diagnosis not present

## 2019-05-22 DIAGNOSIS — C169 Malignant neoplasm of stomach, unspecified: Secondary | ICD-10-CM | POA: Diagnosis not present

## 2019-05-22 DIAGNOSIS — Z8614 Personal history of Methicillin resistant Staphylococcus aureus infection: Secondary | ICD-10-CM | POA: Insufficient documentation

## 2019-05-22 DIAGNOSIS — Z87891 Personal history of nicotine dependence: Secondary | ICD-10-CM | POA: Insufficient documentation

## 2019-05-22 DIAGNOSIS — R918 Other nonspecific abnormal finding of lung field: Secondary | ICD-10-CM | POA: Insufficient documentation

## 2019-05-22 DIAGNOSIS — Z85028 Personal history of other malignant neoplasm of stomach: Secondary | ICD-10-CM | POA: Diagnosis not present

## 2019-05-22 DIAGNOSIS — E1140 Type 2 diabetes mellitus with diabetic neuropathy, unspecified: Secondary | ICD-10-CM | POA: Diagnosis not present

## 2019-05-22 DIAGNOSIS — R590 Localized enlarged lymph nodes: Secondary | ICD-10-CM | POA: Diagnosis not present

## 2019-05-22 DIAGNOSIS — R9389 Abnormal findings on diagnostic imaging of other specified body structures: Secondary | ICD-10-CM

## 2019-05-22 HISTORY — DX: Polyneuropathy, unspecified: G62.9

## 2019-05-22 HISTORY — DX: Malignant (primary) neoplasm, unspecified: C80.1

## 2019-05-22 HISTORY — PX: VIDEO BRONCHOSCOPY WITH ENDOBRONCHIAL ULTRASOUND: SHX6177

## 2019-05-22 LAB — APTT: aPTT: 27 seconds (ref 24–36)

## 2019-05-22 LAB — GLUCOSE, CAPILLARY
Glucose-Capillary: 78 mg/dL (ref 70–99)
Glucose-Capillary: 102 mg/dL — ABNORMAL HIGH (ref 70–99)

## 2019-05-22 LAB — BODY FLUID CELL COUNT WITH DIFFERENTIAL
Eos, Fluid: 1 %
Lymphs, Fluid: 33 %
Monocyte-Macrophage-Serous Fluid: 49 % — ABNORMAL LOW (ref 50–90)
Neutrophil Count, Fluid: 17 % (ref 0–25)
Total Nucleated Cell Count, Fluid: 44 cu mm (ref 0–1000)

## 2019-05-22 LAB — PROTIME-INR
INR: 1 (ref 0.8–1.2)
Prothrombin Time: 13.4 seconds (ref 11.4–15.2)

## 2019-05-22 SURGERY — BRONCHOSCOPY, WITH EBUS
Anesthesia: General

## 2019-05-22 MED ORDER — DEXAMETHASONE SODIUM PHOSPHATE 4 MG/ML IJ SOLN
INTRAMUSCULAR | Status: DC | PRN
  Administered 2019-05-22: 10 mg via INTRAVENOUS

## 2019-05-22 MED ORDER — LACTATED RINGERS IV SOLN: INTRAVENOUS | Status: DC | PRN

## 2019-05-22 MED ORDER — PROPOFOL 10 MG/ML IV BOLUS
INTRAVENOUS | Status: AC
  Filled 2019-05-22: qty 40

## 2019-05-22 MED ORDER — PROPOFOL 10 MG/ML IV BOLUS
INTRAVENOUS | Status: AC
  Filled 2019-05-22: qty 20

## 2019-05-22 MED ORDER — SUCCINYLCHOLINE CHLORIDE 20 MG/ML IJ SOLN
INTRAMUSCULAR | Status: DC | PRN
  Administered 2019-05-22: 120 mg via INTRAVENOUS

## 2019-05-22 MED ORDER — OXYCODONE HCL 5 MG PO TABS: 5.0000 mg | ORAL_TABLET | Freq: Once | ORAL | Status: DC | PRN

## 2019-05-22 MED ORDER — ONDANSETRON HCL 4 MG/2ML IJ SOLN: 4.0000 mg | Freq: Once | INTRAMUSCULAR | Status: DC | PRN

## 2019-05-22 MED ORDER — EPHEDRINE SULFATE 50 MG/ML IJ SOLN
INTRAMUSCULAR | Status: DC | PRN
  Administered 2019-05-22: 5 mg via INTRAVENOUS
  Administered 2019-05-22: 10 mg via INTRAVENOUS

## 2019-05-22 MED ORDER — ONDANSETRON HCL 4 MG/2ML IJ SOLN
INTRAMUSCULAR | Status: DC | PRN
  Administered 2019-05-22: 4 mg via INTRAVENOUS

## 2019-05-22 MED ORDER — PHENYLEPHRINE HCL (PRESSORS) 10 MG/ML IV SOLN
INTRAVENOUS | Status: DC | PRN
  Administered 2019-05-22: 120 ug via INTRAVENOUS
  Administered 2019-05-22: 80 ug via INTRAVENOUS

## 2019-05-22 MED ORDER — PHENYLEPHRINE 40 MCG/ML (10ML) SYRINGE FOR IV PUSH (FOR BLOOD PRESSURE SUPPORT)
PREFILLED_SYRINGE | INTRAVENOUS | Status: AC
  Filled 2019-05-22: qty 10

## 2019-05-22 MED ORDER — LACTATED RINGERS IV SOLN: INTRAVENOUS | Status: DC

## 2019-05-22 MED ORDER — LIDOCAINE 2% (20 MG/ML) 5 ML SYRINGE
INTRAMUSCULAR | Status: AC
  Filled 2019-05-22: qty 5

## 2019-05-22 MED ORDER — ROCURONIUM 10MG/ML (10ML) SYRINGE FOR MEDFUSION PUMP - OPTIME
INTRAVENOUS | Status: DC | PRN
  Administered 2019-05-22: 50 mg via INTRAVENOUS

## 2019-05-22 MED ORDER — FENTANYL CITRATE (PF) 100 MCG/2ML IJ SOLN: 25.0000 ug | INTRAMUSCULAR | Status: DC | PRN

## 2019-05-22 MED ORDER — PROPOFOL 10 MG/ML IV BOLUS
INTRAVENOUS | Status: DC | PRN
  Administered 2019-05-22: 100 mg via INTRAVENOUS

## 2019-05-22 MED ORDER — LIDOCAINE 2% (20 MG/ML) 5 ML SYRINGE
INTRAMUSCULAR | Status: DC | PRN
  Administered 2019-05-22: 60 mg via INTRAVENOUS

## 2019-05-22 MED ORDER — DEXAMETHASONE SODIUM PHOSPHATE 10 MG/ML IJ SOLN
INTRAMUSCULAR | Status: AC
  Filled 2019-05-22: qty 1

## 2019-05-22 MED ORDER — ROCURONIUM BROMIDE 10 MG/ML (PF) SYRINGE
PREFILLED_SYRINGE | INTRAVENOUS | Status: AC
  Filled 2019-05-22: qty 10

## 2019-05-22 MED ORDER — SUCCINYLCHOLINE CHLORIDE 200 MG/10ML IV SOSY
PREFILLED_SYRINGE | INTRAVENOUS | Status: AC
  Filled 2019-05-22: qty 10

## 2019-05-22 MED ORDER — OXYCODONE HCL 5 MG/5ML PO SOLN: 5.0000 mg | Freq: Once | ORAL | Status: DC | PRN

## 2019-05-22 MED ORDER — SUGAMMADEX SODIUM 200 MG/2ML IV SOLN
INTRAVENOUS | Status: DC | PRN
  Administered 2019-05-22: 200 mg via INTRAVENOUS

## 2019-05-22 MED ORDER — ONDANSETRON HCL 4 MG/2ML IJ SOLN
INTRAMUSCULAR | Status: AC
  Filled 2019-05-22: qty 2

## 2019-05-22 MED ORDER — FENTANYL CITRATE (PF) 250 MCG/5ML IJ SOLN
INTRAMUSCULAR | Status: AC
  Filled 2019-05-22: qty 5

## 2019-05-22 MED ORDER — EPHEDRINE 5 MG/ML INJ
INTRAVENOUS | Status: AC
  Filled 2019-05-22: qty 10

## 2019-05-22 MED ORDER — GLYCOPYRROLATE PF 0.2 MG/ML IJ SOSY
PREFILLED_SYRINGE | INTRAMUSCULAR | Status: AC
  Filled 2019-05-22: qty 1

## 2019-05-22 MED ORDER — FENTANYL CITRATE (PF) 100 MCG/2ML IJ SOLN
INTRAMUSCULAR | Status: DC | PRN
  Administered 2019-05-22 (×2): 50 ug via INTRAVENOUS

## 2019-05-22 MED ORDER — PHENYLEPHRINE HCL-NACL 10-0.9 MG/250ML-% IV SOLN
INTRAVENOUS | Status: DC | PRN
  Administered 2019-05-22: 60 ug/min via INTRAVENOUS

## 2019-05-22 MED ORDER — GLYCOPYRROLATE PF 0.2 MG/ML IJ SOSY
PREFILLED_SYRINGE | INTRAMUSCULAR | Status: DC | PRN
  Administered 2019-05-22 (×2): .1 mg via INTRAVENOUS

## 2019-05-22 SURGICAL SUPPLY — 39 items
ADAPTER VALVE BIOPSY EBUS (MISCELLANEOUS) IMPLANT
ADPTR VALVE BIOPSY EBUS (MISCELLANEOUS)
BALLN FOR EBUS SCOPE (BALLOONS) ×2
BALLOON FOR EBUS SCOPE (BALLOONS) IMPLANT
BALN ESCP STRL LXBF-UC160F (BALLOONS) ×1
BRUSH CYTOL CELLEBRITY 1.5X140 (MISCELLANEOUS) IMPLANT
CANISTER SUCT 3000ML PPV (MISCELLANEOUS) ×2 IMPLANT
CNTNR URN SCR LID CUP LEK RST (MISCELLANEOUS) ×1 IMPLANT
CONT SPEC 4OZ STRL OR WHT (MISCELLANEOUS) ×4
COVER BACK TABLE 60X90IN (DRAPES) ×2 IMPLANT
FILTER STRAW FLUID ASPIR (MISCELLANEOUS) IMPLANT
FORCEPS BIOP RJ4 1.8 (CUTTING FORCEPS) IMPLANT
GAUZE SPONGE 4X4 12PLY STRL (GAUZE/BANDAGES/DRESSINGS) ×2 IMPLANT
GLOVE SURG SS PI 7.5 STRL IVOR (GLOVE) ×4 IMPLANT
GOWN STRL REUS W/ TWL LRG LVL3 (GOWN DISPOSABLE) ×2 IMPLANT
GOWN STRL REUS W/TWL LRG LVL3 (GOWN DISPOSABLE) ×4
KIT CLEAN ENDO COMPLIANCE (KITS) ×4 IMPLANT
KIT TURNOVER KIT B (KITS) ×2 IMPLANT
MARKER SKIN DUAL TIP RULER LAB (MISCELLANEOUS) ×2 IMPLANT
NDL ASPIRATION VIZISHOT 19G (NEEDLE) IMPLANT
NDL ASPIRATION VIZISHOT 21G (NEEDLE) IMPLANT
NEEDLE ASPIRATION VIZISHOT 19G (NEEDLE) ×2 IMPLANT
NEEDLE ASPIRATION VIZISHOT 21G (NEEDLE) IMPLANT
NS IRRIG 1000ML POUR BTL (IV SOLUTION) ×2 IMPLANT
OIL SILICONE PENTAX (PARTS (SERVICE/REPAIRS)) ×2 IMPLANT
PAD ARMBOARD 7.5X6 YLW CONV (MISCELLANEOUS) ×4 IMPLANT
STOPCOCK 4 WAY LG BORE MALE ST (IV SETS) ×2 IMPLANT
SYR 20ML ECCENTRIC (SYRINGE) ×4 IMPLANT
SYR 20ML LL LF (SYRINGE) ×2 IMPLANT
SYR 3ML LL SCALE MARK (SYRINGE) IMPLANT
SYR 50ML SLIP (SYRINGE) ×1 IMPLANT
SYR 5ML LL (SYRINGE) ×5 IMPLANT
TRAP SPECIMEN MUCOUS 40CC (MISCELLANEOUS) ×1 IMPLANT
TUBE CONNECTING 20X1/4 (TUBING) ×2 IMPLANT
TUBING EXTENTION W/L.L. (IV SETS) ×2 IMPLANT
VALVE BIOPSY  SINGLE USE (MISCELLANEOUS) ×1
VALVE BIOPSY SINGLE USE (MISCELLANEOUS) ×1 IMPLANT
VALVE SUCTION BRONCHIO DISP (MISCELLANEOUS) ×2 IMPLANT
WATER STERILE IRR 1000ML POUR (IV SOLUTION) ×2 IMPLANT

## 2019-05-22 NOTE — H&P (View-Only) (Signed)
NAME:  Jonathan Lewis, MRN:  [id number], DOB:  July 20, 1940, LOS: 0 ADMISSION DATE:  05/22/2019, CONSULTATION DATE: 05/22/2019 REFERRING MD: Dr. Marin Olp, CHIEF COMPLAINT: adenopathy  History of present illness    This is a 79 year old gentleman with recent diagnosis of stage III aT3 N2 M0 adenocarcinoma of the stomach back in December 2020.  He has received 1 cycle of chemotherapy.  He is followed by Dr. Marin Olp in the oncology clinic.  Patient had a recent PET scan which revealed abnormal PET activity within the mediastinum.  Pulmonary was consulted for recommendations regarding management including biopsy of the mediastinal PET avid adenopathy.  Office note 05/14/2019 Dr. Marin Olp reviewed.  Past Medical History   Past Medical History:  Diagnosis Date  . Acoustic neuroma ([place]) 06/25/2015  . Allergic rhinitis 07/18/2014  . Arthritis   . BPH (benign prostatic hyperplasia)   . Cancer ([place])    stage III stomach-Dx 03/2019  . Carbuncle    recurrent MRSA carbuncles  . Cataract   . Diabetes mellitus    Type II  . Disk prolapse   . Glaucoma   . Hyperlipidemia   . Hypertension   . Male hypogonadism 07/16/2014  . Neuropathy   . Sinus bradycardia    chronic, asymptomatic  . Sinusitis 07/29/2012      Objective   Blood pressure 102/63, pulse 77, height 5\' 8"  (1.727 m), weight 74.4 kg, SpO2 97 %.       No intake or output data in the 24 hours ending 05/22/19 1033 Filed Weights   05/22/19 0843  Weight: 74.4 kg    Examination: General: Elderly male, resting in bed no distress HENT: Tracking appropriately NCAT Lungs: Clear to auscultation bilaterally Cardiovascular: Regular rhythm, S1-S2 Abdomen: Soft nontender nondistended Extremities: No significant edema Neuro: Alert oriented follow commands GU: Deferred   Assessment & Plan:   Stage IIIa, T3 N0 M0 adenocarcinoma of the stomach Abnormal PET scan of the chest Mediastinal adenopathy PET avid adenopathy within the  mediastinum.  Plan: Patient presents today for outpatient endobronchial ultrasound with transbronchial needle aspirations. We will take patient to the OR for sampling of the mediastinal adenopathy seen on pet imaging. We discussed risk benefits and alternatives today at bedside. Patient is agreeable to proceed. No barriers to proceed   Labs   CBC: No results for input(s): WBC, NEUTROABS, HGB, HCT, MCV, PLT in the last 168 hours.  Basic Metabolic Panel: No results for input(s): NA, K, CL, CO2, GLUCOSE, BUN, CREATININE, CALCIUM, MG, PHOS in the last 168 hours. GFR: Estimated Creatinine Clearance: 72.4 mL/min (by C-G formula based on SCr of 0.73 mg/dL). No results for input(s): PROCALCITON, WBC, LATICACIDVEN in the last 168 hours.  Liver Function Tests: No results for input(s): AST, ALT, ALKPHOS, BILITOT, PROT, ALBUMIN in the last 168 hours. No results for input(s): LIPASE, AMYLASE in the last 168 hours. No results for input(s): AMMONIA in the last 168 hours.  ABG No results found for: PHART, PCO2ART, PO2ART, HCO3, TCO2, ACIDBASEDEF, O2SAT   Coagulation Profile: Recent Labs  Lab 05/22/19 0906  INR 1.0    Cardiac Enzymes: No results for input(s): CKTOTAL, CKMB, CKMBINDEX, TROPONINI in the last 168 hours.  HbA1C: Hgb A1c MFr Bld  Date/Time Value Ref Range Status  03/07/2019 01:05 PM 5.9 (H) 4.8 - 5.6 % Final    Comment:    (NOTE) Pre diabetes:          5.7%-6.4% Diabetes:              >  6.4% Glycemic control for   <7.0% adults with diabetes   12/05/2018 03:11 PM 6.1 4.6 - 6.5 % Final    Comment:    Glycemic Control Guidelines for People with Diabetes:Non Diabetic:  <6%Goal of Therapy: <7%Additional Action Suggested:  >8%     CBG: No results for input(s): GLUCAP in the last 168 hours.  Review of Systems:   Review of Systems  Constitutional: Positive for weight loss. Negative for chills, fever and malaise/fatigue.  HENT: Negative for hearing loss, sore throat and  tinnitus.   Eyes: Negative for blurred vision and double vision.  Respiratory: Negative for cough, hemoptysis, sputum production, shortness of breath, wheezing and stridor.   Cardiovascular: Negative for chest pain, palpitations, orthopnea, leg swelling and PND.  Gastrointestinal: Negative for abdominal pain, constipation, diarrhea, heartburn, nausea and vomiting.  Genitourinary: Negative for dysuria, hematuria and urgency.  Musculoskeletal: Negative for joint pain and myalgias.  Skin: Negative for itching and rash.  Neurological: Negative for dizziness, tingling, weakness and headaches.  Endo/Heme/Allergies: Negative for environmental allergies. Does not bruise/bleed easily.  Psychiatric/Behavioral: Negative for depression. The patient is not nervous/anxious and does not have insomnia.   All other systems reviewed and are negative.    Past Medical History  He,  has a past medical history of Acoustic neuroma ([place]) (06/25/2015), Allergic rhinitis (07/18/2014), Arthritis, BPH (benign prostatic hyperplasia), Cancer ([place]), Carbuncle, Cataract, Diabetes mellitus, Disk prolapse, Glaucoma, Hyperlipidemia, Hypertension, Male hypogonadism (07/16/2014), Neuropathy, Sinus bradycardia, and Sinusitis (07/29/2012).   Surgical History    Past Surgical History:  Procedure Laterality Date  . BALLOON DILATION N/A 03/12/2019   Procedure: BALLOON DILATION;  Surgeon: Rush Landmark Telford Nab., MD;  Location: [facility];  Service: Gastroenterology;  Laterality: N/A;  pyloric  . BIOPSY  03/08/2019   Procedure: BIOPSY;  Surgeon: Yetta Flock, MD;  Location: [facility];  Service: Gastroenterology;;  . BIOPSY  03/12/2019   Procedure: BIOPSY;  Surgeon: Irving Copas., MD;  Location: [facility];  Service: Gastroenterology;;  . CATARACT EXTRACTION     x 2  . COLONOSCOPY    . ESOPHAGOGASTRODUODENOSCOPY N/A 03/08/2019   Procedure: ESOPHAGOGASTRODUODENOSCOPY (EGD);  Surgeon: Yetta Flock, MD;   Location: [facility];  Service: Gastroenterology;  Laterality: N/A;  . ESOPHAGOGASTRODUODENOSCOPY (EGD) WITH PROPOFOL N/A 03/12/2019   Procedure: ESOPHAGOGASTRODUODENOSCOPY (EGD) WITH PROPOFOL;  Surgeon: Rush Landmark Telford Nab., MD;  Location: [facility];  Service: Gastroenterology;  Laterality: N/A;  . FINE NEEDLE ASPIRATION  03/12/2019   Procedure: FINE NEEDLE ASPIRATION (FNA) LINEAR;  Surgeon: Irving Copas., MD;  Location: [facility];  Service: Gastroenterology;;  . FOREIGN BODY REMOVAL  03/08/2019   Procedure: FOREIGN BODY REMOVAL;  Surgeon: Yetta Flock, MD;  Location: [facility];  Service: Gastroenterology;;  . IR PATIENT EVAL TECH 0-60 MINS  04/03/2019  . LAPAROTOMY N/A 03/19/2019   Procedure: EXPLORATORY LAPAROTOMY, distal GASTRECTOMY AND PLACEMENT OF G AND J TUBE, gastric jejunostomy;  Surgeon: Greer Pickerel, MD;  Location: [facility];  Service: General;  Laterality: N/A;  . POLYPECTOMY    . PORTACATH PLACEMENT N/A 03/26/2019   Procedure: INSERTION PORT-A-CATH;  Surgeon: Michael Boston, MD;  Location: [facility];  Service: General;  Laterality: N/A;  . UPPER ESOPHAGEAL ENDOSCOPIC ULTRASOUND (EUS) N/A 03/12/2019   Procedure: UPPER ESOPHAGEAL ENDOSCOPIC ULTRASOUND (EUS);  Surgeon: Irving Copas., MD;  Location: [facility];  Service: Gastroenterology;  Laterality: N/A;     Social History   reports that he quit smoking about 40 years ago. He has never used smokeless  tobacco. He reports current alcohol use of about 5.0 standard drinks of alcohol per week. He reports that he does not use drugs.   Family History   His family history includes Cancer in his brother and mother; Diabetes in his sister; Hyperlipidemia in his brother; Hypertension in his brother, mother, and sister; Stroke in his brother. There is no history of Colon cancer, Heart attack, or Sudden death.   Allergies No Known Allergies   Home Medications  Prior to Admission medications   Medication Sig  Start Date End Date Taking? Authorizing Provider  acetaminophen (TYLENOL) 160 MG/5ML solution Place 20.3 mLs (650 mg total) into feeding tube every 6 (six) hours as needed for mild pain or moderate pain. Patient taking differently: Place 640 mg into feeding tube every 6 (six) hours as needed for mild pain or moderate pain.  04/04/19  Yes Greer Pickerel, MD  docusate (COLACE) 50 MG/5ML liquid Place 10 mLs (100 mg total) into feeding tube daily. 04/04/19  Yes Greer Pickerel, MD  gabapentin (NEURONTIN) 250 MG/5ML solution Place 2 mLs (100 mg total) into feeding tube every 12 (twelve) hours. 05/03/19 06/02/19 Yes Ennever, Rudell Cobb, MD  latanoprost (XALATAN) 0.005 % ophthalmic solution Place 1 drop into both eyes at bedtime.  01/09/19  Yes [provider]  lidocaine-prilocaine (EMLA) cream Apply 1 application topically as needed. Place on the port one hour before appointment. Patient taking differently: Apply 1 application topically as needed (port access). Place on the port one hour before appointment. 05/06/19  Yes Volanda Napoleon, MD  ondansetron [facility]) 4 MG/5ML solution Place 59mls (8mg ) into tube every 12 hours as needed for nausea/vomiting. Start 3 days after chemotherapy. 05/06/19  Yes Ennever, Rudell Cobb, MD  SYSTANE ULTRA 0.4-0.3 % SOLN Apply 1 drop to eye 3 (three) times daily as needed (dry eyes).  01/09/19  Yes [provider]  dexamethasone (DECADRON) 1 MG/ML solution Place 8 mLs (8 mg total) into feeding tube daily. Start day after chemotherapy for 2 days total. Patient taking differently: Place 8 mg into feeding tube See admin instructions. Start day after chemotherapy for 2 days total. 05/06/19   Volanda Napoleon, MD  famotidine (PEPCID) 40 MG/5ML suspension Place 2.5 mLs (20 mg total) into feeding tube daily. Patient not taking: Reported on 05/20/2019 04/04/19 05/20/19  Greer Pickerel, MD  LORazepam (ATIVAN) 0.5 MG tablet Take 1 tablet (0.5 mg total) by mouth every 8 (eight) hours. Patient  not taking: Reported on 05/20/2019 05/14/19   [place], Holli Humbles, NP  LORazepam (ATIVAN) 2 MG/ML concentrated solution Take 1.2 mg by mouth every 6 (six) hours as needed for anxiety.    [provider]    Garner Nash, DO [facility] 05/22/2019 10:33 AM

## 2019-05-22 NOTE — Telephone Encounter (Signed)
Jonathan Nash, DO  Volanda Napoleon, MD; P [facility] Triage Pool  Dr. Marin Olp,   Thanks for the referral. All prelim path from station 7 and 4R were negative. Just looked like reactive lymphs to me. I sent for cell count and tissue cultures as well. Hopefully this is inflammatory or reactive. Seems unusal to get sarcoidial like reaction at 79 yo but possible too. We didn't really see any granulomas on pre-lim. But final pending.   Triage - Please schedule follow up appt with me next week in clinic   Thanks  Bradley  --------------------------------------------------------------------- [name], there was no answer and I could not leave a message due to the pt not having voicemail.

## 2019-05-22 NOTE — Anesthesia Preprocedure Evaluation (Addendum)
Anesthesia Evaluation  Patient identified by MRN, date of birth, ID band Patient awake    Reviewed: Allergy & Precautions, NPO status , Patient's Chart, lab work & pertinent test results  History of Anesthesia Complications Negative for: history of anesthetic complications  Airway Mallampati: II  TM Distance: >3 FB Neck ROM: Full    Dental  (+) Dental Advisory Given, Teeth Intact   Pulmonary former smoker,   Lung mass    Pulmonary exam normal        Cardiovascular hypertension (no longer on meds), (-) anginaNormal cardiovascular exam   '17 TTE - mild focal basal hypertrophy of the septum. EF 55% to 60%. Grade 1 diastolic dysfunction. Mildly dilated RA.    Neuro/Psych  Acoustic neuroma   Neuromuscular disease (paresthesias) negative psych ROS   GI/Hepatic Neg liver ROS, GERD  , Stomach cancer    Endo/Other  diabetes (no longer on meds), Type 2  Renal/GU negative Renal ROS     Musculoskeletal  (+) Arthritis ,   Abdominal   Peds  Hematology  (+) anemia ,   Anesthesia Other Findings Covid neg 05/20/19   Reproductive/Obstetrics                            Anesthesia Physical Anesthesia Plan  ASA: III  Anesthesia Plan: General   Post-op Pain Management:    Induction: Intravenous  PONV Risk Score and Plan: 2 and Treatment may vary due to age or medical condition and Ondansetron  Airway Management Planned: Oral ETT  Additional Equipment: None  Intra-op Plan:   Post-operative Plan: Extubation in OR  Informed Consent: I have reviewed the patients History and Physical, chart, labs and discussed the procedure including the risks, benefits and alternatives for the proposed anesthesia with the patient or authorized representative who has indicated his/her understanding and acceptance.     Dental advisory given  Plan Discussed with: CRNA and Anesthesiologist  Anesthesia Plan  Comments:        Anesthesia Quick Evaluation

## 2019-05-22 NOTE — Anesthesia Postprocedure Evaluation (Signed)
Anesthesia Post Note  Patient: Jonathan Lewis  Procedure(s) Performed: VIDEO BRONCHOSCOPY WITH ENDOBRONCHIAL ULTRASOUND (N/A )     Patient location during evaluation: PACU Anesthesia Type: General Level of consciousness: awake and alert Pain management: pain level controlled Vital Signs Assessment: post-procedure vital signs reviewed and stable Respiratory status: spontaneous breathing, nonlabored ventilation and respiratory function stable Cardiovascular status: blood pressure returned to baseline and stable Postop Assessment: no apparent nausea or vomiting Anesthetic complications: no    Last Vitals:  Vitals:   05/22/19 1220 05/22/19 1230  BP: 95/68   Pulse: 85   Resp: 11   Temp:  36.7 C  SpO2: 99%     Last Pain:  Vitals:   05/22/19 1220  PainSc: [name]

## 2019-05-22 NOTE — Discharge Instructions (Signed)
Flexible Bronchoscopy, Care After This sheet gives you information about how to care for yourself after your test. Your doctor may also give you more specific instructions. If you have problems or questions, contact your doctor. Follow these instructions at home: Eating and drinking  The day after the test, go back to your normal diet. Driving  Do not drive for 24 hours if you were given a medicine to help you relax (sedative).  Do not drive or use heavy machinery while taking prescription pain medicine. General instructions   Take over-the-counter and prescription medicines only as told by your doctor.  Return to your normal activities as told. Ask what activities are safe for you.  Do not use any products that have nicotine or tobacco in them. This includes cigarettes and e-cigarettes. If you need help quitting, ask your doctor.  Keep all follow-up visits as told by your doctor. This is important. It is very important if you had a tissue sample (biopsy) taken. Get help right away if:  You have shortness of breath that gets worse.  You get light-headed.  You feel like you are going to pass out (faint).  You have chest pain.  You cough up: ? More than a little blood. ? More blood than before. Summary  Do not eat or drink anything (not even water) for 2 hours after your test, or until your numbing medicine wears off.  Do not use cigarettes. Do not use e-cigarettes.  Get help right away if you have chest pain. This information is not intended to replace advice given to you by your health care provider. Make sure you discuss any questions you have with your health care provider. Document Revised: 03/03/2017 Document Reviewed: 04/08/2016 Elsevier Patient Education  2020 [employer].

## 2019-05-22 NOTE — Transfer of Care (Signed)
Immediate Anesthesia Transfer of Care Note  Patient: Jonathan Lewis  Procedure(s) Performed: VIDEO BRONCHOSCOPY WITH ENDOBRONCHIAL ULTRASOUND (N/A )  Patient Location: PACU  Anesthesia Type:General  Level of Consciousness: awake and patient cooperative  Airway & Oxygen Therapy: Patient Spontanous Breathing and Patient connected to face mask oxygen  Post-op Assessment: Report given to RN and Post -op Vital signs reviewed and stable  Post vital signs: Reviewed and stable  Last Vitals:  Vitals Value Taken Time  BP 99/71 05/22/19 1150  Temp    Pulse 89 05/22/19 1153  Resp 13 05/22/19 1153  SpO2 100 % 05/22/19 1153  Vitals shown include unvalidated device data.  Last Pain:  Vitals:   05/22/19 0858  PainSc: 0-No pain      Patients Stated Pain Goal: 2 ([id number])  Complications: No apparent anesthesia complications

## 2019-05-22 NOTE — Anesthesia Procedure Notes (Signed)
Procedure Name: LMA Insertion Date/Time: 05/22/2019 10:38 AM Performed by: Orlie Dakin, CRNA Pre-anesthesia Checklist: Patient identified, Emergency Drugs available, Suction available, Patient being monitored and Timeout performed Patient Re-evaluated:Patient Re-evaluated prior to induction Oxygen Delivery Method: Circle system utilized Preoxygenation: Pre-oxygenation with 100% oxygen Induction Type: IV induction Ventilation: Mask ventilation without difficulty LMA: LMA inserted LMA Size: 5.0 Tube type: Oral

## 2019-05-22 NOTE — Consult Note (Addendum)
NAME:  Jonathan Lewis, MRN:  [id number], DOB:  March 07, 1941, LOS: 0 ADMISSION DATE:  05/22/2019, CONSULTATION DATE: 05/22/2019 REFERRING MD: Dr. Marin Olp, CHIEF COMPLAINT: adenopathy  History of present illness    This is a 79 year old gentleman with recent diagnosis of stage III aT3 N2 M0 adenocarcinoma of the stomach back in December 2020.  He has received 1 cycle of chemotherapy.  He is followed by Dr. Marin Olp in the oncology clinic.  Patient had a recent PET scan which revealed abnormal PET activity within the mediastinum.  Pulmonary was consulted for recommendations regarding management including biopsy of the mediastinal PET avid adenopathy.  Office note 05/14/2019 Dr. Marin Olp reviewed.  Past Medical History   Past Medical History:  Diagnosis Date  . Acoustic neuroma ([place]) 06/25/2015  . Allergic rhinitis 07/18/2014  . Arthritis   . BPH (benign prostatic hyperplasia)   . Cancer ([place])    stage III stomach-Dx 03/2019  . Carbuncle    recurrent MRSA carbuncles  . Cataract   . Diabetes mellitus    Type II  . Disk prolapse   . Glaucoma   . Hyperlipidemia   . Hypertension   . Male hypogonadism 07/16/2014  . Neuropathy   . Sinus bradycardia    chronic, asymptomatic  . Sinusitis 07/29/2012      Objective   Blood pressure 102/63, pulse 77, height 5\' 8"  (1.727 m), weight 74.4 kg, SpO2 97 %.       No intake or output data in the 24 hours ending 05/22/19 1033 Filed Weights   05/22/19 0843  Weight: 74.4 kg    Examination: General: Elderly male, resting in bed no distress HENT: Tracking appropriately NCAT Lungs: Clear to auscultation bilaterally Cardiovascular: Regular rhythm, S1-S2 Abdomen: Soft nontender nondistended Extremities: No significant edema Neuro: Alert oriented follow commands GU: Deferred   Assessment & Plan:   Stage IIIa, T3 N0 M0 adenocarcinoma of the stomach Abnormal PET scan of the chest Mediastinal adenopathy PET avid adenopathy within the  mediastinum.  Plan: Patient presents today for outpatient endobronchial ultrasound with transbronchial needle aspirations. We will take patient to the OR for sampling of the mediastinal adenopathy seen on pet imaging. We discussed risk benefits and alternatives today at bedside. Patient is agreeable to proceed. No barriers to proceed   Labs   CBC: No results for input(s): WBC, NEUTROABS, HGB, HCT, MCV, PLT in the last 168 hours.  Basic Metabolic Panel: No results for input(s): NA, K, CL, CO2, GLUCOSE, BUN, CREATININE, CALCIUM, MG, PHOS in the last 168 hours. GFR: Estimated Creatinine Clearance: 72.4 mL/min (by C-G formula based on SCr of 0.73 mg/dL). No results for input(s): PROCALCITON, WBC, LATICACIDVEN in the last 168 hours.  Liver Function Tests: No results for input(s): AST, ALT, ALKPHOS, BILITOT, PROT, ALBUMIN in the last 168 hours. No results for input(s): LIPASE, AMYLASE in the last 168 hours. No results for input(s): AMMONIA in the last 168 hours.  ABG No results found for: PHART, PCO2ART, PO2ART, HCO3, TCO2, ACIDBASEDEF, O2SAT   Coagulation Profile: Recent Labs  Lab 05/22/19 0906  INR 1.0    Cardiac Enzymes: No results for input(s): CKTOTAL, CKMB, CKMBINDEX, TROPONINI in the last 168 hours.  HbA1C: Hgb A1c MFr Bld  Date/Time Value Ref Range Status  03/07/2019 01:05 PM 5.9 (H) 4.8 - 5.6 % Final    Comment:    (NOTE) Pre diabetes:          5.7%-6.4% Diabetes:              >  6.4% Glycemic control for   <7.0% adults with diabetes   12/05/2018 03:11 PM 6.1 4.6 - 6.5 % Final    Comment:    Glycemic Control Guidelines for People with Diabetes:Non Diabetic:  <6%Goal of Therapy: <7%Additional Action Suggested:  >8%     CBG: No results for input(s): GLUCAP in the last 168 hours.  Review of Systems:   Review of Systems  Constitutional: Positive for weight loss. Negative for chills, fever and malaise/fatigue.  HENT: Negative for hearing loss, sore throat and  tinnitus.   Eyes: Negative for blurred vision and double vision.  Respiratory: Negative for cough, hemoptysis, sputum production, shortness of breath, wheezing and stridor.   Cardiovascular: Negative for chest pain, palpitations, orthopnea, leg swelling and PND.  Gastrointestinal: Negative for abdominal pain, constipation, diarrhea, heartburn, nausea and vomiting.  Genitourinary: Negative for dysuria, hematuria and urgency.  Musculoskeletal: Negative for joint pain and myalgias.  Skin: Negative for itching and rash.  Neurological: Negative for dizziness, tingling, weakness and headaches.  Endo/Heme/Allergies: Negative for environmental allergies. Does not bruise/bleed easily.  Psychiatric/Behavioral: Negative for depression. The patient is not nervous/anxious and does not have insomnia.   All other systems reviewed and are negative.    Past Medical History  He,  has a past medical history of Acoustic neuroma ([place]) (06/25/2015), Allergic rhinitis (07/18/2014), Arthritis, BPH (benign prostatic hyperplasia), Cancer ([place]), Carbuncle, Cataract, Diabetes mellitus, Disk prolapse, Glaucoma, Hyperlipidemia, Hypertension, Male hypogonadism (07/16/2014), Neuropathy, Sinus bradycardia, and Sinusitis (07/29/2012).   Surgical History    Past Surgical History:  Procedure Laterality Date  . BALLOON DILATION N/A 03/12/2019   Procedure: BALLOON DILATION;  Surgeon: Rush Landmark Telford Nab., MD;  Location: [facility];  Service: Gastroenterology;  Laterality: N/A;  pyloric  . BIOPSY  03/08/2019   Procedure: BIOPSY;  Surgeon: Yetta Flock, MD;  Location: [facility];  Service: Gastroenterology;;  . BIOPSY  03/12/2019   Procedure: BIOPSY;  Surgeon: Irving Copas., MD;  Location: [facility];  Service: Gastroenterology;;  . CATARACT EXTRACTION     x 2  . COLONOSCOPY    . ESOPHAGOGASTRODUODENOSCOPY N/A 03/08/2019   Procedure: ESOPHAGOGASTRODUODENOSCOPY (EGD);  Surgeon: Yetta Flock, MD;   Location: [facility];  Service: Gastroenterology;  Laterality: N/A;  . ESOPHAGOGASTRODUODENOSCOPY (EGD) WITH PROPOFOL N/A 03/12/2019   Procedure: ESOPHAGOGASTRODUODENOSCOPY (EGD) WITH PROPOFOL;  Surgeon: Rush Landmark Telford Nab., MD;  Location: [facility];  Service: Gastroenterology;  Laterality: N/A;  . FINE NEEDLE ASPIRATION  03/12/2019   Procedure: FINE NEEDLE ASPIRATION (FNA) LINEAR;  Surgeon: Irving Copas., MD;  Location: [facility];  Service: Gastroenterology;;  . FOREIGN BODY REMOVAL  03/08/2019   Procedure: FOREIGN BODY REMOVAL;  Surgeon: Yetta Flock, MD;  Location: [facility];  Service: Gastroenterology;;  . IR PATIENT EVAL TECH 0-60 MINS  04/03/2019  . LAPAROTOMY N/A 03/19/2019   Procedure: EXPLORATORY LAPAROTOMY, distal GASTRECTOMY AND PLACEMENT OF G AND J TUBE, gastric jejunostomy;  Surgeon: Greer Pickerel, MD;  Location: [facility];  Service: General;  Laterality: N/A;  . POLYPECTOMY    . PORTACATH PLACEMENT N/A 03/26/2019   Procedure: INSERTION PORT-A-CATH;  Surgeon: Michael Boston, MD;  Location: [facility];  Service: General;  Laterality: N/A;  . UPPER ESOPHAGEAL ENDOSCOPIC ULTRASOUND (EUS) N/A 03/12/2019   Procedure: UPPER ESOPHAGEAL ENDOSCOPIC ULTRASOUND (EUS);  Surgeon: Irving Copas., MD;  Location: [facility];  Service: Gastroenterology;  Laterality: N/A;     Social History   reports that he quit smoking about 40 years ago. He has never used smokeless  tobacco. He reports current alcohol use of about 5.0 standard drinks of alcohol per week. He reports that he does not use drugs.   Family History   His family history includes Cancer in his brother and mother; Diabetes in his sister; Hyperlipidemia in his brother; Hypertension in his brother, mother, and sister; Stroke in his brother. There is no history of Colon cancer, Heart attack, or Sudden death.   Allergies No Known Allergies   Home Medications  Prior to Admission medications   Medication Sig  Start Date End Date Taking? Authorizing Provider  acetaminophen (TYLENOL) 160 MG/5ML solution Place 20.3 mLs (650 mg total) into feeding tube every 6 (six) hours as needed for mild pain or moderate pain. Patient taking differently: Place 640 mg into feeding tube every 6 (six) hours as needed for mild pain or moderate pain.  04/04/19  Yes Greer Pickerel, MD  docusate (COLACE) 50 MG/5ML liquid Place 10 mLs (100 mg total) into feeding tube daily. 04/04/19  Yes Greer Pickerel, MD  gabapentin (NEURONTIN) 250 MG/5ML solution Place 2 mLs (100 mg total) into feeding tube every 12 (twelve) hours. 05/03/19 06/02/19 Yes Ennever, Rudell Cobb, MD  latanoprost (XALATAN) 0.005 % ophthalmic solution Place 1 drop into both eyes at bedtime.  01/09/19  Yes [provider]  lidocaine-prilocaine (EMLA) cream Apply 1 application topically as needed. Place on the port one hour before appointment. Patient taking differently: Apply 1 application topically as needed (port access). Place on the port one hour before appointment. 05/06/19  Yes Volanda Napoleon, MD  ondansetron [facility]) 4 MG/5ML solution Place 49mls (8mg ) into tube every 12 hours as needed for nausea/vomiting. Start 3 days after chemotherapy. 05/06/19  Yes Ennever, Rudell Cobb, MD  SYSTANE ULTRA 0.4-0.3 % SOLN Apply 1 drop to eye 3 (three) times daily as needed (dry eyes).  01/09/19  Yes [provider]  dexamethasone (DECADRON) 1 MG/ML solution Place 8 mLs (8 mg total) into feeding tube daily. Start day after chemotherapy for 2 days total. Patient taking differently: Place 8 mg into feeding tube See admin instructions. Start day after chemotherapy for 2 days total. 05/06/19   Volanda Napoleon, MD  famotidine (PEPCID) 40 MG/5ML suspension Place 2.5 mLs (20 mg total) into feeding tube daily. Patient not taking: Reported on 05/20/2019 04/04/19 05/20/19  Greer Pickerel, MD  LORazepam (ATIVAN) 0.5 MG tablet Take 1 tablet (0.5 mg total) by mouth every 8 (eight) hours. Patient  not taking: Reported on 05/20/2019 05/14/19   [place], Holli Humbles, NP  LORazepam (ATIVAN) 2 MG/ML concentrated solution Take 1.2 mg by mouth every 6 (six) hours as needed for anxiety.    [provider]    Garner Nash, DO [facility] 05/22/2019 10:33 AM

## 2019-05-22 NOTE — Op Note (Signed)
Video Bronchoscopy with Endobronchial Ultrasound Procedure Note  Date of Operation: 05/22/2019  Pre-op Diagnosis: PET avid mediastinal adenopathy  Post-op Diagnosis: PET avid mediastinal adenopathy  Surgeon: Garner Nash, DO   Assistants: None   Anesthesia: General endotracheal anesthesia  Operation: Flexible video fiberoptic bronchoscopy with endobronchial ultrasound and biopsies.  Estimated Blood Loss: Minimal, less than 3 cc  Complications: none   Indications and History: Jonathan Lewis is a 79 y.o. male with PET avid mediastinal adenopathy history of stage III gastric cancer.  The risks, benefits, complications, treatment options and expected outcomes were discussed with the patient.  The possibilities of pneumothorax, pneumonia, reaction to medication, pulmonary aspiration, perforation of a viscus, bleeding, failure to diagnose a condition and creating a complication requiring transfusion or operation were discussed with the patient who freely signed the consent.    Description of Procedure: The patient was examined in the preoperative area and history and data from the preprocedure consultation were reviewed. It was deemed appropriate to proceed.  The patient was taken to [facility], identified as [place] and the procedure verified as Flexible Video Fiberoptic Bronchoscopy.  A Time Out was held and the above information confirmed. After being taken to the operating room general anesthesia was initiated and the patient  was orally intubated. The video fiberoptic bronchoscope was introduced via the endotracheal tube and a general inspection was performed which showed normal right and left lung anatomy with no evidence of endobronchial lesions. The standard scope was then withdrawn and the endobronchial ultrasound was used to identify and characterize the peritracheal, hilar and bronchial lymph nodes. Inspection showed enlarged station 7 and 4R adenopathy. Using real-time  ultrasound guidance Wang needle biopsies were take from Station 7 and 4R nodes and were sent for cytology. The patient tolerated the procedure well without apparent complications. There was no significant blood loss.  Bronchoscope was exchanged to a standard viewing bronchoscope.  We completed a BAL to the right middle lobe with 120 cc of saline and moderate return.  In addition to cytology biopsies from station 7 we also completed tissue culture for AFB bacteria and fungus.  We also on the BAL completed cultures and cell count and differential.  Bronchoscope was brought to just above the main carina there was no evidence of active bleeding.  We used the bronchoscope for therapeutic suctioning and clearance of all blood clots and secretions from within the bilateral lobes. The bronchoscope was withdrawn. Anesthesia was reversed and the patient was taken to the PACU for recovery.   Samples: 1. Wang needle biopsies from station 7 node 2. Wang needle biopsies from patient for our node 3.  BAL right middle lobe  Plans:  The patient will be discharged from the PACU to home when recovered from anesthesia. We will review the cytology, pathology and microbiology results with the patient when they become available. Outpatient followup will be with Garner Nash, DO and Burney Gauze, MD.   Garner Nash, DO [facility] 05/22/2019 11:48 AM

## 2019-05-22 NOTE — Interval H&P Note (Signed)
History and Physical Interval Note:  05/22/2019 10:38 AM  Jonathan Lewis  has presented today for surgery, with the diagnosis of LUNG MASS.  The various methods of treatment have been discussed with the patient and family. After consideration of risks, benefits and other options for treatment, the patient has consented to  Procedure(s): [place] (N/A) as a surgical intervention.  The patient's history has been reviewed, patient examined, no change in status, stable for surgery.  I have reviewed the patient's chart and labs.  Questions were answered to the patient's satisfaction.     [place]

## 2019-05-22 NOTE — Anesthesia Procedure Notes (Signed)
Procedure Name: Intubation Date/Time: 05/22/2019 10:39 AM Performed by: Orlie Dakin, CRNA Pre-anesthesia Checklist: Patient identified Patient Re-evaluated:Patient Re-evaluated prior to induction Oxygen Delivery Method: Circle system utilized Preoxygenation: Pre-oxygenation with 100% oxygen Induction Type: IV induction Ventilation: Mask ventilation without difficulty Laryngoscope Size: Mac and 3 Grade View: Grade II Tube type: Oral Tube size: 8.5 mm Number of attempts: 1 Airway Equipment and Method: Stylet Placement Confirmation: ETT inserted through vocal cords under direct vision,  positive ETCO2 and breath sounds checked- equal and bilateral Secured at: 25 cm Tube secured with: Tape Dental Injury: Teeth and Oropharynx as per pre-operative assessment  Comments: LMA removed due to poor placement and patient coughing. DL performed by E. McKeithan; scant amount of yellowish liquid on vocal cords noted. Bronch performed by Dr. I and clear airway was noted.

## 2019-05-23 ENCOUNTER — Telehealth: Payer: Self-pay | Admitting: Adult Health

## 2019-05-23 LAB — CYTOLOGY - NON PAP

## 2019-05-23 NOTE — Telephone Encounter (Signed)
Patient son called in regarding patient's Jonathan Lewis underwent bronchoscopy/EBUS yesterday May 22, 2019.  Appeared to tolerate procedure well.  Today is had some pain along the sides and waist area.  He has a history of gastric cancer and underwent EBUS yesterday for mediastinal adenopathy.  He denies any hemoptysis fever shortness of breath or cough.  Has taken Tylenol for pain.  Is in no distress.  Discussed with patient son and patient that if pain persist and does not resolve he will need evaluation and chest x-ray.  Unfortunately our office is closed due to inclement weather.  He is advised to go to the emergency room.  Also advised if he develops respiratory distress with shortness of breath he is to seek emergency room care.  May take Tylenol for discomfort.  Patient is to follow-up with our office as planned and as recommended  Please contact office for sooner follow up if symptoms do not improve or worsen or seek emergency care

## 2019-05-24 DIAGNOSIS — E119 Type 2 diabetes mellitus without complications: Secondary | ICD-10-CM | POA: Diagnosis not present

## 2019-05-24 DIAGNOSIS — Z9889 Other specified postprocedural states: Secondary | ICD-10-CM | POA: Diagnosis not present

## 2019-05-24 DIAGNOSIS — Z434 Encounter for attention to other artificial openings of digestive tract: Secondary | ICD-10-CM | POA: Diagnosis not present

## 2019-05-24 DIAGNOSIS — Z431 Encounter for attention to gastrostomy: Secondary | ICD-10-CM | POA: Diagnosis not present

## 2019-05-24 DIAGNOSIS — C169 Malignant neoplasm of stomach, unspecified: Secondary | ICD-10-CM | POA: Diagnosis not present

## 2019-05-24 DIAGNOSIS — Z48815 Encounter for surgical aftercare following surgery on the digestive system: Secondary | ICD-10-CM | POA: Diagnosis not present

## 2019-05-24 DIAGNOSIS — Z483 Aftercare following surgery for neoplasm: Secondary | ICD-10-CM | POA: Diagnosis not present

## 2019-05-24 DIAGNOSIS — K311 Adult hypertrophic pyloric stenosis: Secondary | ICD-10-CM | POA: Diagnosis not present

## 2019-05-24 DIAGNOSIS — M6281 Muscle weakness (generalized): Secondary | ICD-10-CM | POA: Diagnosis not present

## 2019-05-24 LAB — CYTOLOGY - NON PAP

## 2019-05-24 LAB — CULTURE, RESPIRATORY W GRAM STAIN
Culture: NO GROWTH
Culture: NORMAL
Gram Stain: NONE SEEN
Gram Stain: NONE SEEN

## 2019-05-24 NOTE — Telephone Encounter (Signed)
PCCM:  TP thanks for calling him.   I called and spoke with the patients granddaughter.  He is on his way to see his gastroenterologist this morning.  Patient states that his pain is better today and he feels better today in comparison to yesterday.  I encouraged him not to hesitate to reach out if there is any issues.  [facility] 05/24/2019 9:30 AM  rad1

## 2019-05-25 LAB — ACID FAST SMEAR (AFB, MYCOBACTERIA)
Acid Fast Smear: NEGATIVE
Acid Fast Smear: NEGATIVE

## 2019-05-27 ENCOUNTER — Other Ambulatory Visit: Payer: Self-pay | Admitting: Hematology & Oncology

## 2019-05-27 NOTE — Telephone Encounter (Signed)
Spoke with the pt and appt was scheduled for 05/29/19

## 2019-05-28 ENCOUNTER — Other Ambulatory Visit: Payer: Medicare PPO

## 2019-05-28 ENCOUNTER — Other Ambulatory Visit: Payer: Self-pay | Admitting: Hematology & Oncology

## 2019-05-28 ENCOUNTER — Ambulatory Visit: Payer: Medicare PPO | Admitting: Hematology & Oncology

## 2019-05-28 ENCOUNTER — Ambulatory Visit: Payer: Medicare PPO

## 2019-05-28 MED ORDER — GABAPENTIN 250 MG/5ML PO SOLN: 100.0000 mg | Freq: Two times a day (BID) | ORAL | 0 refills | Status: DC

## 2019-05-29 ENCOUNTER — Encounter: Payer: Self-pay | Admitting: Pulmonary Disease

## 2019-05-29 ENCOUNTER — Ambulatory Visit: Payer: Medicare PPO | Admitting: Pulmonary Disease

## 2019-05-29 ENCOUNTER — Other Ambulatory Visit: Payer: Self-pay

## 2019-05-29 VITALS — BP 122/68 | HR 63 | Ht 68.0 in | Wt 164.0 lb

## 2019-05-29 DIAGNOSIS — Z87891 Personal history of nicotine dependence: Secondary | ICD-10-CM

## 2019-05-29 DIAGNOSIS — R590 Localized enlarged lymph nodes: Secondary | ICD-10-CM | POA: Diagnosis not present

## 2019-05-29 DIAGNOSIS — R948 Abnormal results of function studies of other organs and systems: Secondary | ICD-10-CM

## 2019-05-29 DIAGNOSIS — C162 Malignant neoplasm of body of stomach: Secondary | ICD-10-CM

## 2019-05-29 NOTE — Progress Notes (Signed)
Synopsis: Referred in February 2021 for mediastinal adenopathy, Dr. Marin Olp oncology PCP: Biagio Borg, MD  Subjective:   [name] GENDER: male DOB: 12-Dec-1940, MRN: [id number]  Chief Complaint  Patient presents with  . Follow-up    Pt here today to discuss the results of the bronch. Pt states he has been doing okay and denies any real complaints.    79 year old gentleman past medical history of stage III gastric cancer followed by Dr. Marin Olp, history of cataracts history of diabetes history of hypertension.  Patient was initially seen in consultation by myself on 05/22/2019 -day of bronchoscopy.  Patient was referred for abnormal pet imaging and history of gastric cancer for mediastinal staging.  Patient underwent bronchoscopy and did well following this.  Here today for follow-up from recent procedure.  OV 05/29/2019: Patient here today for follow-up after recent endobronchial ultrasound with transbronchial needle aspirations.  Mediastinum was sampled in the locations of PET avid disease seen on recent imaging.  Patient was referred from Dr. Antonieta Pert office.  Here today we reviewed the results of his tissue biopsies.  As well as cultures.  To date these are negative.  Lymphoid tissue was found within samples and considered adequate sampling for pathology.  Overall patient denies weight loss fevers chills night sweats.  Patient denies hemoptysis.   Past Medical History:  Diagnosis Date  . Acoustic neuroma ([place]) 06/25/2015  . Allergic rhinitis 07/18/2014  . Arthritis   . BPH (benign prostatic hyperplasia)   . Cancer ([place])    stage III stomach-Dx 03/2019  . Carbuncle    recurrent MRSA carbuncles  . Cataract   . Diabetes mellitus    Type II  . Disk prolapse   . Glaucoma   . Hyperlipidemia   . Hypertension   . Male hypogonadism 07/16/2014  . Neuropathy   . Sinus bradycardia    chronic, asymptomatic  . Sinusitis 07/29/2012     Family History  Problem Relation  Age of Onset  . Cancer Mother   . Hypertension Mother   . Diabetes Sister   . Cancer Brother        colon  . Hypertension Brother   . Hyperlipidemia Brother   . Stroke Brother   . Hypertension Sister   . Colon cancer Neg Hx   . Heart attack Neg Hx   . Sudden death Neg Hx      Past Surgical History:  Procedure Laterality Date  . BALLOON DILATION N/A 03/12/2019   Procedure: BALLOON DILATION;  Surgeon: Rush Landmark Telford Nab., MD;  Location: [facility];  Service: Gastroenterology;  Laterality: N/A;  pyloric  . BIOPSY  03/08/2019   Procedure: BIOPSY;  Surgeon: Yetta Flock, MD;  Location: [facility];  Service: Gastroenterology;;  . BIOPSY  03/12/2019   Procedure: BIOPSY;  Surgeon: Irving Copas., MD;  Location: [facility];  Service: Gastroenterology;;  . CATARACT EXTRACTION     x 2  . COLONOSCOPY    . ESOPHAGOGASTRODUODENOSCOPY N/A 03/08/2019   Procedure: ESOPHAGOGASTRODUODENOSCOPY (EGD);  Surgeon: Yetta Flock, MD;  Location: [facility];  Service: Gastroenterology;  Laterality: N/A;  . ESOPHAGOGASTRODUODENOSCOPY (EGD) WITH PROPOFOL N/A 03/12/2019   Procedure: ESOPHAGOGASTRODUODENOSCOPY (EGD) WITH PROPOFOL;  Surgeon: Rush Landmark Telford Nab., MD;  Location: [facility];  Service: Gastroenterology;  Laterality: N/A;  . FINE NEEDLE ASPIRATION  03/12/2019   Procedure: FINE NEEDLE ASPIRATION (FNA) LINEAR;  Surgeon: Irving Copas., MD;  Location: [facility];  Service: Gastroenterology;;  . FOREIGN BODY REMOVAL  03/08/2019   Procedure: FOREIGN BODY REMOVAL;  Surgeon: Yetta Flock, MD;  Location: [facility];  Service: Gastroenterology;;  . IR PATIENT EVAL TECH 0-60 MINS  04/03/2019  . LAPAROTOMY N/A 03/19/2019   Procedure: EXPLORATORY LAPAROTOMY, distal GASTRECTOMY AND PLACEMENT OF G AND J TUBE, gastric jejunostomy;  Surgeon: Greer Pickerel, MD;  Location: [facility];  Service: General;  Laterality: N/A;  . POLYPECTOMY    . PORTACATH PLACEMENT N/A  03/26/2019   Procedure: INSERTION PORT-A-CATH;  Surgeon: Michael Boston, MD;  Location: [facility];  Service: General;  Laterality: N/A;  . UPPER ESOPHAGEAL ENDOSCOPIC ULTRASOUND (EUS) N/A 03/12/2019   Procedure: UPPER ESOPHAGEAL ENDOSCOPIC ULTRASOUND (EUS);  Surgeon: Irving Copas., MD;  Location: [facility];  Service: Gastroenterology;  Laterality: N/A;  . VIDEO BRONCHOSCOPY WITH ENDOBRONCHIAL ULTRASOUND N/A 05/22/2019   Procedure: VIDEO BRONCHOSCOPY WITH ENDOBRONCHIAL ULTRASOUND;  Surgeon: Garner Nash, DO;  Location: [facility];  Service: Thoracic;  Laterality: N/A;    Social History   Socioeconomic History  . Marital status: Married    Spouse name: Not on file  . Number of children: 7  . Years of education: 7  . Highest education level: Not on file  Occupational History  . Occupation: Mining engineer: RETIRED  Tobacco Use  . Smoking status: Former Smoker    Packs/day: 1.00    Years: 24.00    Pack years: 24.00    Types: Cigarettes    Quit date: 10/03/1978    Years since quitting: 40.6  . Smokeless tobacco: Never Used  Substance and Sexual Activity  . Alcohol use: Yes    Alcohol/week: 5.0 standard drinks    Types: 5 Standard drinks or equivalent per week    Comment: pint, occasion beer  . Drug use: No  . Sexual activity: Yes    Partners: Female  Other Topics Concern  . Not on file  Social History Narrative   9th grade.  Married '60. Jonathan Lewis 3 sons -'63 '64, '66; 4 dtrs -'60, '62, '61, '65; 15 grands, 25 great-grands.   Jonathan Lewis, some cleaning work. Lives alone with wife.   ACD- discussed living will and HCPOA (July '14) provided packet.          Social Determinants of Health   Financial Resource Strain: Low Risk   . Difficulty of Paying Living Expenses: Not hard at all  Food Insecurity: No Food Insecurity  . Worried About Charity fundraiser in the Last Year: Never true  . Ran Out of Food in the Last Year: Never true  Transportation Needs: No Transportation  Needs  . Lack of Transportation (Medical): No  . Lack of Transportation (Non-Medical): No  Physical Activity: Inactive  . Days of Exercise per Week: 0 days  . Minutes of Exercise per Session: 0 min  Stress: No Stress Concern Present  . Feeling of Stress : Not at all  Social Connections: Not Isolated  . Frequency of Communication with Friends and Family: More than three times a week  . Frequency of Social Gatherings with Friends and Family: More than three times a week  . Attends Religious Services: More than 4 times per year  . Active Member of Clubs or Organizations: Yes  . Attends Archivist Meetings: More than 4 times per year  . Marital Status: Married  Human resources officer Violence: Not At Risk  . Fear of Current or Ex-Partner: No  . Emotionally Abused: No  . Physically Abused: No  . Sexually Abused:  No     No Known Allergies   Outpatient Medications Prior to Visit  Medication Sig Dispense Refill  . acetaminophen (TYLENOL) 160 MG/5ML solution Place 20.3 mLs (650 mg total) into feeding tube every 6 (six) hours as needed for mild pain or moderate pain. (Patient taking differently: Place 640 mg into feeding tube every 6 (six) hours as needed for mild pain or moderate pain. ) 120 mL 0  . gabapentin (NEURONTIN) 250 MG/5ML solution Place 2 mLs (100 mg total) into feeding tube every 12 (twelve) hours. 120 mL 0  . lidocaine-prilocaine (EMLA) cream Apply 1 application topically as needed. Place on the port one hour before appointment. (Patient taking differently: Apply 1 application topically as needed (port access). Place on the port one hour before appointment.) 30 g 3  . dexamethasone (DECADRON) 1 MG/ML solution Place 8 mLs (8 mg total) into feeding tube daily. Start day after chemotherapy for 2 days total. (Patient not taking: Reported on 05/29/2019) 30 mL 3  . docusate (COLACE) 50 MG/5ML liquid Place 10 mLs (100 mg total) into feeding tube daily. (Patient not taking: Reported on  05/29/2019) 100 mL 0  . famotidine (PEPCID) 40 MG/5ML suspension Place 2.5 mLs (20 mg total) into feeding tube daily. (Patient not taking: Reported on 05/20/2019) 75 mL 0  . latanoprost (XALATAN) 0.005 % ophthalmic solution Place 1 drop into both eyes at bedtime.     Marland Kitchen LORazepam (ATIVAN) 2 MG/ML concentrated solution Take 1.2 mg by mouth every 6 (six) hours as needed for anxiety.    . ondansetron (ZOFRAN) 4 MG/5ML solution Place 82ms (82m into tube every 12 hours as needed for nausea/vomiting. Start 3 days after chemotherapy. (Patient not taking: Reported on 05/29/2019) 100 mL 3  . SYSTANE ULTRA 0.4-0.3 % SOLN Apply 1 drop to eye 3 (three) times daily as needed (dry eyes).      No facility-administered medications prior to visit.    Review of Systems  Constitutional: Negative for chills, fever, malaise/fatigue and weight loss.  HENT: Negative for hearing loss, sore throat and tinnitus.   Eyes: Negative for blurred vision and double vision.  Respiratory: Negative for cough, hemoptysis, sputum production, shortness of breath, wheezing and stridor.   Cardiovascular: Negative for chest pain, palpitations, orthopnea, leg swelling and PND.  Gastrointestinal: Positive for vomiting. Negative for abdominal pain, constipation, diarrhea, heartburn and nausea.       PEG tube in place  Genitourinary: Negative for dysuria, hematuria and urgency.  Musculoskeletal: Negative for joint pain and myalgias.  Skin: Negative for itching and rash.  Neurological: Negative for dizziness, tingling, weakness and headaches.  Endo/Heme/Allergies: Negative for environmental allergies. Does not bruise/bleed easily.  Psychiatric/Behavioral: Negative for depression. The patient is not nervous/anxious and does not have insomnia.   All other systems reviewed and are negative.    Objective:  Physical Exam Vitals reviewed.  Constitutional:      General: He is not in acute distress.    Appearance: He is well-developed.    HENT:     Head: Normocephalic and atraumatic.  Eyes:     General: No scleral icterus.    Conjunctiva/sclera: Conjunctivae normal.     Pupils: Pupils are equal, round, and reactive to light.  Neck:     Vascular: No JVD.     Trachea: No tracheal deviation.  Cardiovascular:     Rate and Rhythm: Normal rate and regular rhythm.     Heart sounds: Normal heart sounds. No murmur.  Pulmonary:  Effort: Pulmonary effort is normal. No tachypnea, accessory muscle usage or respiratory distress.     Breath sounds: Normal breath sounds. No stridor. No wheezing, rhonchi or rales.  Abdominal:     Comments: Gastric tube in place.  Musculoskeletal:        General: No tenderness.     Cervical back: Neck supple.  Lymphadenopathy:     Cervical: No cervical adenopathy.  Skin:    General: Skin is warm and dry.     Capillary Refill: Capillary refill takes less than 2 seconds.     Findings: No rash.  Neurological:     Mental Status: He is alert and oriented to person, place, and time.  Psychiatric:        Behavior: Behavior normal.      Vitals:   05/29/19 1424  BP: 122/68  Pulse: 63  SpO2: 98%  Weight: 164 lb (74.4 kg)  Height: _0  (1.727 m)   98% on  RA BMI Readings from Last 3 Encounters:  05/29/19 24.94 kg/m  05/22/19 24.94 kg/m  05/14/19 24.69 kg/m   Wt Readings from Last 3 Encounters:  05/29/19 164 lb (74.4 kg)  05/22/19 164 lb (74.4 kg)  05/14/19 162 lb 6.4 oz (73.7 kg)     CBC    Component Value Date/Time   WBC 3.8 (L) 05/14/2019 0830   WBC 4.4 04/04/2019 0419   RBC 3.61 (L) 05/14/2019 0830   HGB 10.1 (L) 05/14/2019 0830   HCT 30.9 (L) 05/14/2019 0830   PLT 229 05/14/2019 0830   MCV 85.6 05/14/2019 0830   MCH 28.0 05/14/2019 0830   MCHC 32.7 05/14/2019 0830   RDW 14.2 05/14/2019 0830   LYMPHSABS 1.6 05/14/2019 0830   MONOABS 0.4 05/14/2019 0830   EOSABS 0.1 05/14/2019 0830   BASOSABS 0.0 05/14/2019 0830    Chest Imaging: 05/10/2019 nuclear medicine pet  imaging abnormal PET scan, PET avid mediastinal hilar adenopathy. The patient's images have been independently reviewed by me.    Pulmonary Functions Testing Results: No flowsheet data found.     Assessment & Plan:     ICD-10-CM   1. Mediastinal adenopathy  R59.0 CT Chest Wo Contrast  2. Abnormal PET scan, mediastinum  R94.8 CT Chest Wo Contrast  3. Malignant neoplasm of body of stomach (HCC)  C16.2   4. Former smoker  Z87.891     Assessment:   This is a 79 year old gentleman with initial presentation of abnormal PET scan of the mediastinum to include bilateral PET avid hilar adenopathy and mediastinal adenopathy.  This was concerning as he has recent diagnosis of stage III gastric cancer.  He had underwent at least 1 cycle of chemotherapy prior to this PET scan.  I wonder if we are seeing a sarcoid or inflammatory-like reaction that has been picked up on the PET scan in relation to the patient's symmetric adenopathy found within the chest.  This has been sampled with adequate lymphoid tissue identified on cellularity with an the path report.  No evidence of malignant cells were identified.  We reviewed this in the office today with the patient.  Like the next best step would be close imaging follow-up to see if there is any enlargement in the adenopathy.  Other things on the differential would be a lymphoproliferative disease.  I suspect if there was adenocarcinoma from gastric cancer within the nodes we should have have evidence of that on biopsy.  Progression and prognosis of this is still yet unknown at this  time.  However without ongoing systemic symptoms best for close observation at this time.  If lymph nodes were to continue to increase in size we may need to consider repeat biopsy or consideration for mediastinoscopy for whole node sampling.  Plan Following Extensive Data Review & Interpretation:  . I reviewed prior external note(s) from 05/14/2019 oncology, Dr. Marin Olp office note  reviewed. . I reviewed the result(s) of video bronchoscopy with transbronchial needle aspiration tissue culture negative, cytology lymphoid tissue present with no malignant cells identified.  Tissue cultures negative BAL with 17% neutrophils 33% lymphs. . I have ordered repeat noncontrasted CT of the chest in 4 months. We will still need to follow along culture results.  Microbiology will likely hold AFB and fungus cultures for several weeks.  If positive microbiology will usually contact me.  Independent interpretation of tests . Review of patient's nuclear medicine pet imaging 05/10/2019 images revealed PET avid bilateral mediastinal hilar adenopathy. The patient's images have been independently reviewed by me.    Discussion of management or test interpretation with another colleague Dr. Marin Olp.  We appreciate the consultation.    Current Outpatient Medications:  .  acetaminophen (TYLENOL) 160 MG/5ML solution, Place 20.3 mLs (650 mg total) into feeding tube every 6 (six) hours as needed for mild pain or moderate pain. (Patient taking differently: Place 640 mg into feeding tube every 6 (six) hours as needed for mild pain or moderate pain. ), Disp: 120 mL, Rfl: 0 .  gabapentin (NEURONTIN) 250 MG/5ML solution, Place 2 mLs (100 mg total) into feeding tube every 12 (twelve) hours., Disp: 120 mL, Rfl: 0 .  lidocaine-prilocaine (EMLA) cream, Apply 1 application topically as needed. Place on the port one hour before appointment. (Patient taking differently: Apply 1 application topically as needed (port access). Place on the port one hour before appointment.), Disp: 30 g, Rfl: 3 .  dexamethasone (DECADRON) 1 MG/ML solution, Place 8 mLs (8 mg total) into feeding tube daily. Start day after chemotherapy for 2 days total. (Patient not taking: Reported on 05/29/2019), Disp: 30 mL, Rfl: 3 .  docusate (COLACE) 50 MG/5ML liquid, Place 10 mLs (100 mg total) into feeding tube daily. (Patient not taking: Reported on  05/29/2019), Disp: 100 mL, Rfl: 0 .  famotidine (PEPCID) 40 MG/5ML suspension, Place 2.5 mLs (20 mg total) into feeding tube daily. (Patient not taking: Reported on 05/20/2019), Disp: 75 mL, Rfl: 0 .  latanoprost (XALATAN) 0.005 % ophthalmic solution, Place 1 drop into both eyes at bedtime. , Disp: , Rfl:  .  LORazepam (ATIVAN) 2 MG/ML concentrated solution, Take 1.2 mg by mouth every 6 (six) hours as needed for anxiety., Disp: , Rfl:  .  ondansetron (ZOFRAN) 4 MG/5ML solution, Place 30ms (849m into tube every 12 hours as needed for nausea/vomiting. Start 3 days after chemotherapy. (Patient not taking: Reported on 05/29/2019), Disp: 100 mL, Rfl: 3 .  SYSTANE ULTRA 0.4-0.3 % SOLN, Apply 1 drop to eye 3 (three) times daily as needed (dry eyes). , Disp: , Rfl:    BrGarner NashDO [facility] 05/29/2019 2:51 PM

## 2019-05-29 NOTE — Patient Instructions (Signed)
Thank you for visiting Dr. Valeta Harms at [facility]. Today we recommend the following:  Orders Placed This Encounter  Procedures  . CT Chest Wo Contrast   Return in about 4 months (around 09/26/2019). to see Dr. Valeta Harms following repeat CT Chest     Please do your part to reduce the spread of COVID-19.

## 2019-05-30 ENCOUNTER — Telehealth: Payer: Self-pay

## 2019-05-30 DIAGNOSIS — Z431 Encounter for attention to gastrostomy: Secondary | ICD-10-CM | POA: Diagnosis not present

## 2019-05-30 DIAGNOSIS — C169 Malignant neoplasm of stomach, unspecified: Secondary | ICD-10-CM | POA: Diagnosis not present

## 2019-05-30 DIAGNOSIS — M6281 Muscle weakness (generalized): Secondary | ICD-10-CM | POA: Diagnosis not present

## 2019-05-30 DIAGNOSIS — K311 Adult hypertrophic pyloric stenosis: Secondary | ICD-10-CM | POA: Diagnosis not present

## 2019-05-30 DIAGNOSIS — E119 Type 2 diabetes mellitus without complications: Secondary | ICD-10-CM | POA: Diagnosis not present

## 2019-05-30 DIAGNOSIS — Z9889 Other specified postprocedural states: Secondary | ICD-10-CM | POA: Diagnosis not present

## 2019-05-30 DIAGNOSIS — Z434 Encounter for attention to other artificial openings of digestive tract: Secondary | ICD-10-CM | POA: Diagnosis not present

## 2019-05-30 DIAGNOSIS — Z483 Aftercare following surgery for neoplasm: Secondary | ICD-10-CM | POA: Diagnosis not present

## 2019-05-30 DIAGNOSIS — Z48815 Encounter for surgical aftercare following surgery on the digestive system: Secondary | ICD-10-CM | POA: Diagnosis not present

## 2019-05-30 NOTE — Telephone Encounter (Signed)
Message left for Melissa today with verbal ok to continue with requested orders.

## 2019-05-30 NOTE — Telephone Encounter (Signed)
Home Health Verbal Orders - Caller/Agency: Melissa with [facility] Number: [phone] (can leave voicemail) Requesting OT/PT/Skilled Nursing/Social Work/Speech Therapy: RN Frequency:  1x a week for 4 week to continue home health.

## 2019-05-30 NOTE — Telephone Encounter (Signed)
Ok for verbals 

## 2019-06-03 DIAGNOSIS — C169 Malignant neoplasm of stomach, unspecified: Secondary | ICD-10-CM | POA: Diagnosis not present

## 2019-06-03 DIAGNOSIS — E440 Moderate protein-calorie malnutrition: Secondary | ICD-10-CM | POA: Diagnosis not present

## 2019-06-04 ENCOUNTER — Inpatient Hospital Stay: Payer: Medicare PPO

## 2019-06-04 ENCOUNTER — Inpatient Hospital Stay (HOSPITAL_BASED_OUTPATIENT_CLINIC_OR_DEPARTMENT_OTHER): Payer: Medicare PPO | Admitting: Hematology & Oncology

## 2019-06-04 ENCOUNTER — Inpatient Hospital Stay: Payer: Medicare PPO | Attending: Hematology & Oncology

## 2019-06-04 ENCOUNTER — Encounter: Payer: Self-pay | Admitting: *Deleted

## 2019-06-04 ENCOUNTER — Other Ambulatory Visit: Payer: Self-pay

## 2019-06-04 DIAGNOSIS — E440 Moderate protein-calorie malnutrition: Secondary | ICD-10-CM

## 2019-06-04 DIAGNOSIS — D509 Iron deficiency anemia, unspecified: Secondary | ICD-10-CM

## 2019-06-04 DIAGNOSIS — Z7952 Long term (current) use of systemic steroids: Secondary | ICD-10-CM | POA: Diagnosis not present

## 2019-06-04 DIAGNOSIS — Z79899 Other long term (current) drug therapy: Secondary | ICD-10-CM | POA: Insufficient documentation

## 2019-06-04 DIAGNOSIS — C169 Malignant neoplasm of stomach, unspecified: Secondary | ICD-10-CM | POA: Diagnosis not present

## 2019-06-04 DIAGNOSIS — C162 Malignant neoplasm of body of stomach: Secondary | ICD-10-CM

## 2019-06-04 DIAGNOSIS — Z5111 Encounter for antineoplastic chemotherapy: Secondary | ICD-10-CM | POA: Insufficient documentation

## 2019-06-04 DIAGNOSIS — K921 Melena: Secondary | ICD-10-CM

## 2019-06-04 LAB — CMP (CANCER CENTER ONLY)
ALT: 11 U/L (ref 0–44)
AST: 14 U/L — ABNORMAL LOW (ref 15–41)
Albumin: 3.4 g/dL — ABNORMAL LOW (ref 3.5–5.0)
Alkaline Phosphatase: 53 U/L (ref 38–126)
Anion gap: 5 (ref 5–15)
BUN: 9 mg/dL (ref 8–23)
CO2: 28 mmol/L (ref 22–32)
Calcium: 9 mg/dL (ref 8.9–10.3)
Chloride: 108 mmol/L (ref 98–111)
Creatinine: 0.69 mg/dL (ref 0.61–1.24)
GFR, Est AFR Am: >60 mL/min (ref >60)
GFR, Estimated: >60 mL/min (ref >60)
Glucose, Bld: 116 mg/dL — ABNORMAL HIGH (ref 70–99)
Potassium: 3.8 mmol/L (ref 3.5–5.1)
Sodium: 141 mmol/L (ref 135–145)
Total Bilirubin: 0.3 mg/dL (ref 0.3–1.2)
Total Protein: 6.1 g/dL — ABNORMAL LOW (ref 6.5–8.1)

## 2019-06-04 LAB — CBC WITH DIFFERENTIAL (CANCER CENTER ONLY)
Abs Immature Granulocytes: 0.02 10*3/uL (ref 0.00–0.07)
Basophils Absolute: 0 10*3/uL (ref 0.0–0.1)
Basophils Relative: 1 %
Eosinophils Absolute: 0.1 10*3/uL (ref 0.0–0.5)
Eosinophils Relative: 2 %
HCT: 31.7 % — ABNORMAL LOW (ref 39.0–52.0)
Hemoglobin: 10.2 g/dL — ABNORMAL LOW (ref 13.0–17.0)
Immature Granulocytes: 1 %
Lymphocytes Relative: 54 %
Lymphs Abs: 1.9 10*3/uL (ref 0.7–4.0)
MCH: 27.8 pg (ref 26.0–34.0)
MCHC: 32.2 g/dL (ref 30.0–36.0)
MCV: 86.4 fL (ref 80.0–100.0)
Monocytes Absolute: 0.4 10*3/uL (ref 0.1–1.0)
Monocytes Relative: 11 %
Neutro Abs: 1.1 10*3/uL — ABNORMAL LOW (ref 1.7–7.7)
Neutrophils Relative %: 31 %
Platelet Count: 188 10*3/uL (ref 150–400)
RBC: 3.67 MIL/uL — ABNORMAL LOW (ref 4.22–5.81)
RDW: 14.9 % (ref 11.5–15.5)
WBC Count: 3.5 10*3/uL — ABNORMAL LOW (ref 4.0–10.5)
nRBC: 0 % (ref 0.0–0.2)

## 2019-06-04 LAB — IRON AND TIBC
Iron: 51 ug/dL (ref 42–163)
Saturation Ratios: 18 % — ABNORMAL LOW (ref 20–55)
TIBC: 289 ug/dL (ref 202–409)
UIBC: 238 ug/dL (ref 117–376)

## 2019-06-04 LAB — FERRITIN: Ferritin: 36 ng/mL (ref 24–336)

## 2019-06-04 MED ORDER — SODIUM CHLORIDE 0.9 % IV SOLN
Freq: Once | INTRAVENOUS | Status: DC
  Filled 2019-06-04: qty 250

## 2019-06-04 MED ORDER — DEXAMETHASONE SODIUM PHOSPHATE 10 MG/ML IJ SOLN
10.0000 mg | Freq: Once | INTRAMUSCULAR | Status: AC
  Administered 2019-06-04: 10 mg via INTRAVENOUS

## 2019-06-04 MED ORDER — SODIUM CHLORIDE 0.9% FLUSH
3.0000 mL | INTRAVENOUS | Status: DC | PRN
  Filled 2019-06-04: qty 10

## 2019-06-04 MED ORDER — PALONOSETRON HCL INJECTION 0.25 MG/5ML
0.2500 mg | Freq: Once | INTRAVENOUS | Status: AC
  Administered 2019-06-04: 0.25 mg via INTRAVENOUS

## 2019-06-04 MED ORDER — SODIUM CHLORIDE 0.9 % IV SOLN
200.0000 mg | Freq: Once | INTRAVENOUS | Status: AC
  Administered 2019-06-04: 200 mg via INTRAVENOUS
  Filled 2019-06-04: qty 200

## 2019-06-04 MED ORDER — LEUCOVORIN CALCIUM INJECTION 350 MG
400.0000 mg/m2 | Freq: Once | INTRAVENOUS | Status: AC
  Administered 2019-06-04: 712 mg via INTRAVENOUS
  Filled 2019-06-04: qty 35.6

## 2019-06-04 MED ORDER — HEPARIN SOD (PORK) LOCK FLUSH 100 UNIT/ML IV SOLN
500.0000 [IU] | Freq: Once | INTRAVENOUS | Status: DC | PRN
  Filled 2019-06-04: qty 5

## 2019-06-04 MED ORDER — SODIUM CHLORIDE 0.9 % IV SOLN
1920.0000 mg/m2 | INTRAVENOUS | Status: DC
  Administered 2019-06-04: 3400 mg via INTRAVENOUS
  Filled 2019-06-04: qty 68

## 2019-06-04 MED ORDER — HEPARIN SOD (PORK) LOCK FLUSH 100 UNIT/ML IV SOLN
250.0000 [IU] | Freq: Once | INTRAVENOUS | Status: DC | PRN
  Filled 2019-06-04: qty 5

## 2019-06-04 MED ORDER — PALONOSETRON HCL INJECTION 0.25 MG/5ML
INTRAVENOUS | Status: AC
  Filled 2019-06-04: qty 5

## 2019-06-04 MED ORDER — ALTEPLASE 2 MG IJ SOLR
2.0000 mg | Freq: Once | INTRAMUSCULAR | Status: DC | PRN
  Filled 2019-06-04: qty 2

## 2019-06-04 MED ORDER — SODIUM CHLORIDE 0.9% FLUSH
10.0000 mL | INTRAVENOUS | Status: DC | PRN
  Filled 2019-06-04: qty 10

## 2019-06-04 MED ORDER — OXALIPLATIN CHEMO INJECTION 100 MG/20ML
68.0000 mg/m2 | Freq: Once | INTRAVENOUS | Status: AC
  Administered 2019-06-04: 120 mg via INTRAVENOUS
  Filled 2019-06-04: qty 20

## 2019-06-04 MED ORDER — FLUOROURACIL CHEMO INJECTION 2.5 GM/50ML: 320.0000 mg/m2 | Freq: Once | INTRAVENOUS | Status: DC

## 2019-06-04 MED ORDER — DEXTROSE 5 % IV SOLN
Freq: Once | INTRAVENOUS | Status: AC
  Filled 2019-06-04: qty 250

## 2019-06-04 MED ORDER — DEXAMETHASONE SODIUM PHOSPHATE 10 MG/ML IJ SOLN
INTRAMUSCULAR | Status: AC
  Filled 2019-06-04: qty 1

## 2019-06-04 NOTE — Addendum Note (Signed)
Addended by: Burney Gauze R on: 06/04/2019 01:05 PM   Modules accepted: Orders

## 2019-06-04 NOTE — Patient Instructions (Signed)

## 2019-06-04 NOTE — Progress Notes (Signed)
Hematology and Oncology Follow Up Visit  Jonathan Lewis [id number] Aug 26, 1940 79 y.o. 06/04/2019   Principle Diagnosis:   Stage IIIA (T3N2M0) adenocarcinoma of the stomach --  S/p distal gastrectomy on 03/19/2019  -- HER2-/PD-L1+  Current Therapy:    Adjuvant FOLFOX - cycle 1/4 - start on 05/13/2019     Interim History:  Jonathan Lewis is in for his follow-up.  He is actually doing quite good.  He has one of the abdominal drainage tubes out now.  Hopefully, next week he will have his feeding tube taken out.  He is able to eat and swallow.  Hopefully the surgeon will be able to get out the feeding tube.  We sent him over to pulmonary medicine.  As expected, Dr. Valeta Harms did a fantastic job.  He went ahead and had a bronchoscopy done with endoscopic ultrasound.  Biopsies were taken of enlarged lymph nodes.  Thankfully, all the biopsies came back negative for any malignancy.  There is been no problems with fever.  He has had no problems with bleeding.  There is been no problems with his bowels or bladder.  He has had no leg swelling.  Overall, he feels a little bit stronger.  His weight is going up.  He does have a little bit of tingling in the fingers.  He does not have any tingling in the feet.  Overall, I would say his performance status is ECOG 1.      Medications:  Current Outpatient Medications:  .  acetaminophen (TYLENOL) 160 MG/5ML solution, Place 20.3 mLs (650 mg total) into feeding tube every 6 (six) hours as needed for mild pain or moderate pain. (Patient taking differently: Place 640 mg into feeding tube every 6 (six) hours as needed for mild pain or moderate pain. ), Disp: 120 mL, Rfl: 0 .  dexamethasone (DECADRON) 1 MG/ML solution, Place 8 mLs (8 mg total) into feeding tube daily. Start day after chemotherapy for 2 days total. (Patient not taking: Reported on 05/29/2019), Disp: 30 mL, Rfl: 3 .  docusate (COLACE) 50 MG/5ML liquid, Place 10 mLs (100 mg total) into feeding tube  daily. (Patient not taking: Reported on 05/29/2019), Disp: 100 mL, Rfl: 0 .  famotidine (PEPCID) 40 MG/5ML suspension, Place 2.5 mLs (20 mg total) into feeding tube daily. (Patient not taking: Reported on 05/20/2019), Disp: 75 mL, Rfl: 0 .  gabapentin (NEURONTIN) 250 MG/5ML solution, Place 2 mLs (100 mg total) into feeding tube every 12 (twelve) hours. (Patient not taking: Reported on 06/04/2019), Disp: 120 mL, Rfl: 0 .  latanoprost (XALATAN) 0.005 % ophthalmic solution, Place 1 drop into both eyes at bedtime. , Disp: , Rfl:  .  lidocaine-prilocaine (EMLA) cream, Apply 1 application topically as needed. Place on the port one hour before appointment. (Patient not taking: Reported on 06/04/2019), Disp: 30 g, Rfl: 3 .  LORazepam (ATIVAN) 2 MG/ML concentrated solution, Take 1.2 mg by mouth every 6 (six) hours as needed for anxiety., Disp: , Rfl:  .  ondansetron (ZOFRAN) 4 MG/5ML solution, Place 86ms (87m into tube every 12 hours as needed for nausea/vomiting. Start 3 days after chemotherapy. (Patient not taking: Reported on 05/29/2019), Disp: 100 mL, Rfl: 3 .  SYSTANE ULTRA 0.4-0.3 % SOLN, Apply 1 drop to eye 3 (three) times daily as needed (dry eyes). , Disp: , Rfl:   Allergies: No Known Allergies  Past Medical History, Surgical history, Social history, and Family History were reviewed and updated.  Review of Systems: Review of Systems  Constitutional: Positive for appetite change, fatigue and unexpected weight change.  HENT:  Negative.   Eyes: Negative.   Respiratory: Negative.   Cardiovascular: Negative.   Gastrointestinal: Positive for abdominal pain, nausea and vomiting.  Endocrine: Negative.   Genitourinary: Negative.    Musculoskeletal: Negative.   Skin: Positive for itching.  Neurological: Negative.   Hematological: Negative.   Psychiatric/Behavioral: Negative.     Physical Exam:  vitals were not taken for this visit.   Wt Readings from Last 3 Encounters:  06/04/19 165 lb 0.8 oz  (74.9 kg)  05/29/19 164 lb (74.4 kg)  05/22/19 164 lb (74.4 kg)    Physical Exam Vitals reviewed.  HENT:     Head: Normocephalic and atraumatic.  Eyes:     Pupils: Pupils are equal, round, and reactive to light.  Cardiovascular:     Rate and Rhythm: Normal rate and regular rhythm.     Heart sounds: Normal heart sounds.  Pulmonary:     Effort: Pulmonary effort is normal.     Breath sounds: Normal breath sounds.  Abdominal:     General: Bowel sounds are normal.     Palpations: Abdomen is soft.     Comments: Abdominal exam shows healing laparotomy scar.  He does have a couple tubes in the abdomen.  1 is a feeding tube.  I guess the other is some type of drainage catheter.  There is no fluid wave.  There is no abdominal mass.  There is no palpable liver or spleen tip.  Musculoskeletal:        General: No tenderness or deformity. Normal range of motion.     Cervical back: Normal range of motion.  Lymphadenopathy:     Cervical: No cervical adenopathy.  Skin:    General: Skin is warm and dry.     Findings: No erythema or rash.  Neurological:     Mental Status: He is alert and oriented to person, place, and time.  Psychiatric:        Behavior: Behavior normal.        Thought Content: Thought content normal.        Judgment: Judgment normal.      Lab Results  Component Value Date   WBC 3.5 (L) 06/04/2019   HGB 10.2 (L) 06/04/2019   HCT 31.7 (L) 06/04/2019   MCV 86.4 06/04/2019   PLT 188 06/04/2019     Chemistry      Component Value Date/Time   NA 141 06/04/2019 0940   K 3.8 06/04/2019 0940   CL 108 06/04/2019 0940   CO2 28 06/04/2019 0940   BUN 9 06/04/2019 0940   CREATININE 0.69 06/04/2019 0940   CREATININE 1.06 07/16/2012 1038      Component Value Date/Time   CALCIUM 9.0 06/04/2019 0940   ALKPHOS 53 06/04/2019 0940   AST 14 (L) 06/04/2019 0940   ALT 11 06/04/2019 0940   BILITOT 0.3 06/04/2019 0940      Impression and Plan: Jonathan Lewis is a 79 year old  African-American male.  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  Also happy that the bronchoscopy did not shows any obvious metastatic disease.  I am not sure why the PET scan shows the positive lymph nodes outside of them being reactive.  I thought maybe there would be some sarcoidosis.  We will proceed with a second cycle of treatment.  We will have 4 cycles of FOLFOX and then we will switch over to radiation and Xeloda.  I am just  happy that he is able to eat now.  Hopefully the feeding tube will be removed.  I would think that his weight should keep going up.  This will be a good sign for [place].  We will plan to get him back to see [place] in another couple weeks.        Volanda Napoleon, MD 3/2/202111:01 AM

## 2019-06-04 NOTE — Progress Notes (Signed)
ANC = 1.1 Okay to treat today per Dr. Marin Olp. Will not give fluorouracil bolus today. Deleted per Dr. Antonieta Pert instructions.

## 2019-06-04 NOTE — Patient Instructions (Signed)
[facility] Discharge Instructions for Patients Receiving Chemotherapy  Today you received the following chemotherapy agents Oxasliplatin, Leukovorin, 5FU, Iron  To help prevent nausea and vomiting after your treatment, we encourage you to take your nausea medication as directed   If you develop nausea and vomiting that is not controlled by your nausea medication, call the clinic.   BELOW ARE SYMPTOMS THAT SHOULD BE REPORTED IMMEDIATELY:  *FEVER GREATER THAN 100.5 F  *CHILLS WITH OR WITHOUT FEVER  NAUSEA AND VOMITING THAT IS NOT CONTROLLED WITH YOUR NAUSEA MEDICATION  *UNUSUAL SHORTNESS OF BREATH  *UNUSUAL BRUISING OR BLEEDING  TENDERNESS IN MOUTH AND THROAT WITH OR WITHOUT PRESENCE OF ULCERS  *URINARY PROBLEMS  *BOWEL PROBLEMS  UNUSUAL RASH Items with * indicate a potential emergency and should be followed up as soon as possible.  Feel free to call the clinic should you have any questions or concerns. The clinic phone number is [phone].  Please show the [facility] at check-in to the Emergency Department and triage nurse.

## 2019-06-05 ENCOUNTER — Ambulatory Visit: Payer: Self-pay

## 2019-06-06 ENCOUNTER — Other Ambulatory Visit: Payer: Self-pay

## 2019-06-06 ENCOUNTER — Inpatient Hospital Stay: Payer: Medicare PPO

## 2019-06-06 VITALS — BP 111/74 | HR 71 | Temp 97.0°F | Resp 20

## 2019-06-06 DIAGNOSIS — Z7952 Long term (current) use of systemic steroids: Secondary | ICD-10-CM | POA: Diagnosis not present

## 2019-06-06 DIAGNOSIS — Z79899 Other long term (current) drug therapy: Secondary | ICD-10-CM | POA: Diagnosis not present

## 2019-06-06 DIAGNOSIS — Z5111 Encounter for antineoplastic chemotherapy: Secondary | ICD-10-CM | POA: Diagnosis not present

## 2019-06-06 DIAGNOSIS — C169 Malignant neoplasm of stomach, unspecified: Secondary | ICD-10-CM | POA: Diagnosis not present

## 2019-06-06 DIAGNOSIS — C162 Malignant neoplasm of body of stomach: Secondary | ICD-10-CM

## 2019-06-06 MED ORDER — HEPARIN SOD (PORK) LOCK FLUSH 100 UNIT/ML IV SOLN
500.0000 [IU] | Freq: Once | INTRAVENOUS | Status: AC | PRN
  Administered 2019-06-06: 500 [IU]
  Filled 2019-06-06: qty 5

## 2019-06-06 MED ORDER — SODIUM CHLORIDE 0.9% FLUSH
10.0000 mL | INTRAVENOUS | Status: DC | PRN
  Administered 2019-06-06: 10 mL
  Filled 2019-06-06: qty 10

## 2019-06-06 NOTE — Progress Notes (Signed)
Patient requested dressing change for abdominal tube.  Done with antibiotic ointment around tube and ABD pad applied.

## 2019-06-07 DIAGNOSIS — K311 Adult hypertrophic pyloric stenosis: Secondary | ICD-10-CM | POA: Diagnosis not present

## 2019-06-07 DIAGNOSIS — Z48815 Encounter for surgical aftercare following surgery on the digestive system: Secondary | ICD-10-CM | POA: Diagnosis not present

## 2019-06-07 DIAGNOSIS — C169 Malignant neoplasm of stomach, unspecified: Secondary | ICD-10-CM | POA: Diagnosis not present

## 2019-06-07 DIAGNOSIS — Z434 Encounter for attention to other artificial openings of digestive tract: Secondary | ICD-10-CM | POA: Diagnosis not present

## 2019-06-07 DIAGNOSIS — E119 Type 2 diabetes mellitus without complications: Secondary | ICD-10-CM | POA: Diagnosis not present

## 2019-06-07 DIAGNOSIS — Z483 Aftercare following surgery for neoplasm: Secondary | ICD-10-CM | POA: Diagnosis not present

## 2019-06-07 DIAGNOSIS — Z9889 Other specified postprocedural states: Secondary | ICD-10-CM | POA: Diagnosis not present

## 2019-06-07 DIAGNOSIS — Z431 Encounter for attention to gastrostomy: Secondary | ICD-10-CM | POA: Diagnosis not present

## 2019-06-07 DIAGNOSIS — M6281 Muscle weakness (generalized): Secondary | ICD-10-CM | POA: Diagnosis not present

## 2019-06-11 ENCOUNTER — Ambulatory Visit: Payer: Medicare PPO

## 2019-06-11 ENCOUNTER — Other Ambulatory Visit: Payer: Medicare PPO

## 2019-06-11 ENCOUNTER — Ambulatory Visit: Payer: Medicare PPO | Admitting: Hematology & Oncology

## 2019-06-11 DIAGNOSIS — M6281 Muscle weakness (generalized): Secondary | ICD-10-CM | POA: Diagnosis not present

## 2019-06-11 DIAGNOSIS — K311 Adult hypertrophic pyloric stenosis: Secondary | ICD-10-CM | POA: Diagnosis not present

## 2019-06-11 DIAGNOSIS — Z48815 Encounter for surgical aftercare following surgery on the digestive system: Secondary | ICD-10-CM | POA: Diagnosis not present

## 2019-06-11 DIAGNOSIS — E119 Type 2 diabetes mellitus without complications: Secondary | ICD-10-CM | POA: Diagnosis not present

## 2019-06-11 DIAGNOSIS — Z9889 Other specified postprocedural states: Secondary | ICD-10-CM | POA: Diagnosis not present

## 2019-06-11 DIAGNOSIS — Z483 Aftercare following surgery for neoplasm: Secondary | ICD-10-CM | POA: Diagnosis not present

## 2019-06-11 DIAGNOSIS — Z431 Encounter for attention to gastrostomy: Secondary | ICD-10-CM | POA: Diagnosis not present

## 2019-06-11 DIAGNOSIS — C169 Malignant neoplasm of stomach, unspecified: Secondary | ICD-10-CM | POA: Diagnosis not present

## 2019-06-11 DIAGNOSIS — Z434 Encounter for attention to other artificial openings of digestive tract: Secondary | ICD-10-CM | POA: Diagnosis not present

## 2019-06-19 ENCOUNTER — Other Ambulatory Visit: Payer: Medicare PPO

## 2019-06-19 ENCOUNTER — Ambulatory Visit: Payer: Medicare PPO

## 2019-06-19 ENCOUNTER — Ambulatory Visit: Payer: Medicare PPO | Admitting: Hematology & Oncology

## 2019-06-19 DIAGNOSIS — Z9889 Other specified postprocedural states: Secondary | ICD-10-CM | POA: Diagnosis not present

## 2019-06-19 DIAGNOSIS — C169 Malignant neoplasm of stomach, unspecified: Secondary | ICD-10-CM | POA: Diagnosis not present

## 2019-06-19 DIAGNOSIS — Z434 Encounter for attention to other artificial openings of digestive tract: Secondary | ICD-10-CM | POA: Diagnosis not present

## 2019-06-19 DIAGNOSIS — Z483 Aftercare following surgery for neoplasm: Secondary | ICD-10-CM | POA: Diagnosis not present

## 2019-06-19 DIAGNOSIS — M6281 Muscle weakness (generalized): Secondary | ICD-10-CM | POA: Diagnosis not present

## 2019-06-19 DIAGNOSIS — K311 Adult hypertrophic pyloric stenosis: Secondary | ICD-10-CM | POA: Diagnosis not present

## 2019-06-19 DIAGNOSIS — Z48815 Encounter for surgical aftercare following surgery on the digestive system: Secondary | ICD-10-CM | POA: Diagnosis not present

## 2019-06-19 DIAGNOSIS — E119 Type 2 diabetes mellitus without complications: Secondary | ICD-10-CM | POA: Diagnosis not present

## 2019-06-19 DIAGNOSIS — Z431 Encounter for attention to gastrostomy: Secondary | ICD-10-CM | POA: Diagnosis not present

## 2019-06-21 LAB — FUNGAL ORGANISM REFLEX

## 2019-06-21 LAB — FUNGUS CULTURE WITH STAIN

## 2019-06-21 LAB — FUNGUS CULTURE RESULT

## 2019-06-24 ENCOUNTER — Ambulatory Visit: Payer: Medicare PPO | Attending: Internal Medicine

## 2019-06-24 DIAGNOSIS — Z23 Encounter for immunization: Secondary | ICD-10-CM

## 2019-06-24 NOTE — Progress Notes (Signed)
   Covid-19 Vaccination Clinic  Name:  Jonathan Lewis    MRN: [id number] DOB: 1940/06/23  06/24/2019  [name] was observed post Covid-19 immunization for 15 minutes without incident. He was provided with Vaccine Information Sheet and instruction to access the V-Safe system.   [name] was instructed to call 911 with any severe reactions post vaccine: Marland Kitchen Difficulty breathing  . Swelling of face and throat  . A fast heartbeat  . A bad rash all over body  . Dizziness and weakness   Immunizations Administered    Name Date Dose VIS Date Route   Pfizer COVID-19 Vaccine 06/24/2019  9:18 AM 0.3 mL 03/15/2019 Intramuscular   Manufacturer: [place]   Lot: [id number]   [place]: [id number]

## 2019-06-25 ENCOUNTER — Other Ambulatory Visit: Payer: Self-pay | Admitting: *Deleted

## 2019-06-25 ENCOUNTER — Inpatient Hospital Stay: Payer: Medicare PPO | Admitting: Nutrition

## 2019-06-25 ENCOUNTER — Other Ambulatory Visit: Payer: Self-pay

## 2019-06-25 ENCOUNTER — Inpatient Hospital Stay: Payer: Medicare PPO

## 2019-06-25 ENCOUNTER — Encounter: Payer: Self-pay | Admitting: Hematology & Oncology

## 2019-06-25 ENCOUNTER — Encounter: Payer: Self-pay | Admitting: *Deleted

## 2019-06-25 ENCOUNTER — Inpatient Hospital Stay (HOSPITAL_BASED_OUTPATIENT_CLINIC_OR_DEPARTMENT_OTHER): Payer: Medicare PPO | Admitting: Hematology & Oncology

## 2019-06-25 VITALS — BP 120/75 | HR 68 | Temp 98.2°F | Resp 20 | Wt 170.8 lb

## 2019-06-25 VITALS — BP 120/75 | HR 68 | Temp 98.2°F | Resp 20 | Ht 68.0 in | Wt 170.8 lb

## 2019-06-25 DIAGNOSIS — Z79899 Other long term (current) drug therapy: Secondary | ICD-10-CM | POA: Diagnosis not present

## 2019-06-25 DIAGNOSIS — C162 Malignant neoplasm of body of stomach: Secondary | ICD-10-CM

## 2019-06-25 DIAGNOSIS — Z7952 Long term (current) use of systemic steroids: Secondary | ICD-10-CM | POA: Diagnosis not present

## 2019-06-25 DIAGNOSIS — C169 Malignant neoplasm of stomach, unspecified: Secondary | ICD-10-CM | POA: Diagnosis not present

## 2019-06-25 DIAGNOSIS — Z95828 Presence of other vascular implants and grafts: Secondary | ICD-10-CM

## 2019-06-25 DIAGNOSIS — Z5111 Encounter for antineoplastic chemotherapy: Secondary | ICD-10-CM | POA: Diagnosis not present

## 2019-06-25 LAB — CMP (CANCER CENTER ONLY)
ALT: 9 U/L (ref 0–44)
AST: 13 U/L — ABNORMAL LOW (ref 15–41)
Albumin: 3.4 g/dL — ABNORMAL LOW (ref 3.5–5.0)
Alkaline Phosphatase: 48 U/L (ref 38–126)
Anion gap: 5 (ref 5–15)
BUN: 12 mg/dL (ref 8–23)
CO2: 27 mmol/L (ref 22–32)
Calcium: 8.7 mg/dL — ABNORMAL LOW (ref 8.9–10.3)
Chloride: 109 mmol/L (ref 98–111)
Creatinine: 0.78 mg/dL (ref 0.61–1.24)
GFR, Est AFR Am: >60 mL/min (ref >60)
GFR, Estimated: >60 mL/min (ref >60)
Glucose, Bld: 142 mg/dL — ABNORMAL HIGH (ref 70–99)
Potassium: 3.9 mmol/L (ref 3.5–5.1)
Sodium: 141 mmol/L (ref 135–145)
Total Bilirubin: 0.4 mg/dL (ref 0.3–1.2)
Total Protein: 6 g/dL — ABNORMAL LOW (ref 6.5–8.1)

## 2019-06-25 LAB — CBC WITH DIFFERENTIAL (CANCER CENTER ONLY)
Abs Immature Granulocytes: 0 10*3/uL (ref 0.00–0.07)
Basophils Absolute: 0 10*3/uL (ref 0.0–0.1)
Basophils Relative: 0 %
Eosinophils Absolute: 0 10*3/uL (ref 0.0–0.5)
Eosinophils Relative: 1 %
HCT: 31.6 % — ABNORMAL LOW (ref 39.0–52.0)
Hemoglobin: 10.3 g/dL — ABNORMAL LOW (ref 13.0–17.0)
Immature Granulocytes: 0 %
Lymphocytes Relative: 54 %
Lymphs Abs: 1.5 10*3/uL (ref 0.7–4.0)
MCH: 28.5 pg (ref 26.0–34.0)
MCHC: 32.6 g/dL (ref 30.0–36.0)
MCV: 87.5 fL (ref 80.0–100.0)
Monocytes Absolute: 0.4 10*3/uL (ref 0.1–1.0)
Monocytes Relative: 16 %
Neutro Abs: 0.8 10*3/uL — ABNORMAL LOW (ref 1.7–7.7)
Neutrophils Relative %: 29 %
Platelet Count: 165 10*3/uL (ref 150–400)
RBC: 3.61 MIL/uL — ABNORMAL LOW (ref 4.22–5.81)
RDW: 14.6 % (ref 11.5–15.5)
WBC Count: 2.7 10*3/uL — ABNORMAL LOW (ref 4.0–10.5)
nRBC: 0 % (ref 0.0–0.2)

## 2019-06-25 LAB — IRON AND TIBC
Iron: 69 ug/dL (ref 42–163)
Saturation Ratios: 25 % (ref 20–55)
TIBC: 281 ug/dL (ref 202–409)
UIBC: 212 ug/dL (ref 117–376)

## 2019-06-25 LAB — FERRITIN: Ferritin: 77 ng/mL (ref 24–336)

## 2019-06-25 MED ORDER — FAMOTIDINE 20 MG PO TABS: 20.0000 mg | ORAL_TABLET | Freq: Every day | ORAL | 2 refills | Status: DC

## 2019-06-25 MED ORDER — PALONOSETRON HCL INJECTION 0.25 MG/5ML
0.2500 mg | Freq: Once | INTRAVENOUS | Status: AC
  Administered 2019-06-25: 0.25 mg via INTRAVENOUS

## 2019-06-25 MED ORDER — SODIUM CHLORIDE 0.9 % IV SOLN
1920.0000 mg/m2 | INTRAVENOUS | Status: DC
  Administered 2019-06-25: 3400 mg via INTRAVENOUS
  Filled 2019-06-25: qty 68

## 2019-06-25 MED ORDER — OXALIPLATIN CHEMO INJECTION 100 MG/20ML
68.0000 mg/m2 | Freq: Once | INTRAVENOUS | Status: AC
  Administered 2019-06-25: 120 mg via INTRAVENOUS
  Filled 2019-06-25: qty 20

## 2019-06-25 MED ORDER — DEXAMETHASONE 4 MG PO TABS: 8.0000 mg | ORAL_TABLET | Freq: Every day | ORAL | 0 refills | Status: DC

## 2019-06-25 MED ORDER — DEXTROSE 5 % IV SOLN
Freq: Once | INTRAVENOUS | Status: AC
  Filled 2019-06-25: qty 250

## 2019-06-25 MED ORDER — ONDANSETRON HCL 8 MG PO TABS: ORAL_TABLET | ORAL | 1 refills | Status: DC

## 2019-06-25 MED ORDER — DEXAMETHASONE SODIUM PHOSPHATE 10 MG/ML IJ SOLN
10.0000 mg | Freq: Once | INTRAMUSCULAR | Status: AC
  Administered 2019-06-25: 10 mg via INTRAVENOUS

## 2019-06-25 MED ORDER — GABAPENTIN 100 MG PO CAPS: 100.0000 mg | ORAL_CAPSULE | Freq: Two times a day (BID) | ORAL | 1 refills | Status: DC

## 2019-06-25 MED ORDER — ACETAMINOPHEN 325 MG PO TABS: 650.0000 mg | ORAL_TABLET | Freq: Four times a day (QID) | ORAL | 0 refills | Status: DC | PRN

## 2019-06-25 MED ORDER — LEUCOVORIN CALCIUM INJECTION 350 MG
400.0000 mg/m2 | Freq: Once | INTRAVENOUS | Status: AC
  Administered 2019-06-25: 712 mg via INTRAVENOUS
  Filled 2019-06-25: qty 35.6

## 2019-06-25 MED ORDER — DOCUSATE SODIUM 100 MG PO CAPS: 100.0000 mg | ORAL_CAPSULE | Freq: Every day | ORAL | 3 refills | Status: DC

## 2019-06-25 MED ORDER — PALONOSETRON HCL INJECTION 0.25 MG/5ML
INTRAVENOUS | Status: AC
  Filled 2019-06-25: qty 5

## 2019-06-25 MED ORDER — SODIUM CHLORIDE 0.9% FLUSH
10.0000 mL | INTRAVENOUS | Status: DC | PRN
  Administered 2019-06-25: 10 mL via INTRAVENOUS
  Filled 2019-06-25: qty 10

## 2019-06-25 MED ORDER — DEXAMETHASONE SODIUM PHOSPHATE 10 MG/ML IJ SOLN
INTRAMUSCULAR | Status: AC
  Filled 2019-06-25: qty 1

## 2019-06-25 NOTE — Patient Instructions (Signed)
Oxaliplatin Injection What is this medicine? OXALIPLATIN (ox AL i PLA tin) is a chemotherapy drug. It targets fast dividing cells, like cancer cells, and causes these cells to die. This medicine is used to treat cancers of the colon and rectum, and many other cancers. This medicine may be used for other purposes; ask your health care provider or pharmacist if you have questions. COMMON BRAND NAME(S): Eloxatin What should I tell my health care provider before I take this medicine? They need to know if you have any of these conditions:  heart disease  history of irregular heartbeat  liver disease  low blood counts, like white cells, platelets, or red blood cells  lung or breathing disease, like asthma  take medicines that treat or prevent blood clots  tingling of the fingers or toes, or other nerve disorder  an unusual or allergic reaction to oxaliplatin, other chemotherapy, other medicines, foods, dyes, or preservatives  pregnant or trying to get pregnant  breast-feeding How should I use this medicine? This drug is given as an infusion into a vein. It is administered in a hospital or clinic by a specially trained health care professional. Talk to your pediatrician regarding the use of this medicine in children. Special care may be needed. Overdosage: If you think you have taken too much of this medicine contact a poison control center or emergency room at once. NOTE: This medicine is only for you. Do not share this medicine with others. What if I miss a dose? It is important not to miss a dose. Call your doctor or health care professional if you are unable to keep an appointment. What may interact with this medicine? Do not take this medicine with any of the following medications:  cisapride  dronedarone  pimozide  thioridazine This medicine may also interact with the following medications:  aspirin and aspirin-like medicines  certain medicines that treat or prevent  blood clots like warfarin, apixaban, dabigatran, and rivaroxaban  cisplatin  cyclosporine  diuretics  medicines for infection like acyclovir, adefovir, amphotericin B, bacitracin, cidofovir, foscarnet, ganciclovir, gentamicin, pentamidine, vancomycin  NSAIDs, medicines for pain and inflammation, like ibuprofen or naproxen  other medicines that prolong the QT interval (an abnormal heart rhythm)  pamidronate  zoledronic acid This list may not describe all possible interactions. Give your health care provider a list of all the medicines, herbs, non-prescription drugs, or dietary supplements you use. Also tell them if you smoke, drink alcohol, or use illegal drugs. Some items may interact with your medicine. What should I watch for while using this medicine? Your condition will be monitored carefully while you are receiving this medicine. You may need blood work done while you are taking this medicine. This medicine may make you feel generally unwell. This is not uncommon as chemotherapy can affect healthy cells as well as cancer cells. Report any side effects. Continue your course of treatment even though you feel ill unless your healthcare professional tells you to stop. This medicine can make you more sensitive to cold. Do not drink cold drinks or use ice. Cover exposed skin before coming in contact with cold temperatures or cold objects. When out in cold weather wear warm clothing and cover your mouth and nose to warm the air that goes into your lungs. Tell your doctor if you get sensitive to the cold. Do not become pregnant while taking this medicine or for 9 months after stopping it. Women should inform their health care professional if they wish to become   pregnant or think they might be pregnant. Men should not father a child while taking this medicine and for 6 months after stopping it. There is potential for serious side effects to an unborn child. Talk to your health care professional  for more information. Do not breast-feed a child while taking this medicine or for 3 months after stopping it. This medicine has caused ovarian failure in some women. This medicine may make it more difficult to get pregnant. Talk to your health care professional if you are concerned about your fertility. This medicine has caused decreased sperm counts in some men. This may make it more difficult to father a child. Talk to your health care professional if you are concerned about your fertility. This medicine may increase your risk of getting an infection. Call your health care professional for advice if you get a fever, chills, or sore throat, or other symptoms of a cold or flu. Do not treat yourself. Try to avoid being around people who are sick. Avoid taking medicines that contain aspirin, acetaminophen, ibuprofen, naproxen, or ketoprofen unless instructed by your health care professional. These medicines may hide a fever. Be careful brushing or flossing your teeth or using a toothpick because you may get an infection or bleed more easily. If you have any dental work done, tell your dentist you are receiving this medicine. What side effects may I notice from receiving this medicine? Side effects that you should report to your doctor or health care professional as soon as possible:  allergic reactions like skin rash, itching or hives, swelling of the face, lips, or tongue  breathing problems  cough  low blood counts - this medicine may decrease the number of white blood cells, red blood cells, and platelets. You may be at increased risk for infections and bleeding  nausea, vomiting  pain, redness, or irritation at site where injected  pain, tingling, numbness in the hands or feet  signs and symptoms of bleeding such as bloody or black, tarry stools; red or dark brown urine; spitting up blood or brown material that looks like coffee grounds; red spots on the skin; unusual bruising or bleeding  from the eyes, gums, or nose  signs and symptoms of a dangerous change in heartbeat or heart rhythm like chest pain; dizziness; fast, irregular heartbeat; palpitations; feeling faint or lightheaded; falls  signs and symptoms of infection like fever; chills; cough; sore throat; pain or trouble passing urine  signs and symptoms of liver injury like dark yellow or brown urine; general ill feeling or flu-like symptoms; light-colored stools; loss of appetite; nausea; right upper belly pain; unusually weak or tired; yellowing of the eyes or skin  signs and symptoms of low red blood cells or anemia such as unusually weak or tired; feeling faint or lightheaded; falls  signs and symptoms of muscle injury like dark urine; trouble passing urine or change in the amount of urine; unusually weak or tired; muscle pain; back pain Side effects that usually do not require medical attention (report to your doctor or health care professional if they continue or are bothersome):  changes in taste  diarrhea  gas  hair loss  loss of appetite  mouth sores This list may not describe all possible side effects. Call your doctor for medical advice about side effects. You may report side effects to FDA at 1-800-FDA-1088. Where should I keep my medicine? This drug is given in a hospital or clinic and will not be stored at home. NOTE:   This sheet is a summary. It may not cover all possible information. If you have questions about this medicine, talk to your doctor, pharmacist, or health care provider.  2020 Elsevier/Gold Standard (2018-08-08 12:20:35)  Leucovorin injection What is this medicine? LEUCOVORIN (loo koe VOR in) is used to prevent or treat the harmful effects of some medicines. This medicine is used to treat anemia caused by a low amount of folic acid in the body. It is also used with 5-fluorouracil (5-FU) to treat colon cancer. This medicine may be used for other purposes; ask your health care provider  or pharmacist if you have questions. What should I tell my health care provider before I take this medicine? They need to know if you have any of these conditions:  anemia from low levels of vitamin B-12 in the blood  an unusual or allergic reaction to leucovorin, folic acid, other medicines, foods, dyes, or preservatives  pregnant or trying to get pregnant  breast-feeding How should I use this medicine? This medicine is for injection into a muscle or into a vein. It is given by a health care professional in a hospital or clinic setting. Talk to your pediatrician regarding the use of this medicine in children. Special care may be needed. Overdosage: If you think you have taken too much of this medicine contact a poison control center or emergency room at once. NOTE: This medicine is only for you. Do not share this medicine with others. What if I miss a dose? This does not apply. What may interact with this medicine?  capecitabine  fluorouracil  phenobarbital  phenytoin  primidone  trimethoprim-sulfamethoxazole This list may not describe all possible interactions. Give your health care provider a list of all the medicines, herbs, non-prescription drugs, or dietary supplements you use. Also tell them if you smoke, drink alcohol, or use illegal drugs. Some items may interact with your medicine. What should I watch for while using this medicine? Your condition will be monitored carefully while you are receiving this medicine. This medicine may increase the side effects of 5-fluorouracil, 5-FU. Tell your doctor or health care professional if you have diarrhea or mouth sores that do not get better or that get worse. What side effects may I notice from receiving this medicine? Side effects that you should report to your doctor or health care professional as soon as possible:  allergic reactions like skin rash, itching or hives, swelling of the face, lips, or tongue  breathing  problems  fever, infection  mouth sores  unusual bleeding or bruising  unusually weak or tired Side effects that usually do not require medical attention (report to your doctor or health care professional if they continue or are bothersome):  constipation or diarrhea  loss of appetite  nausea, vomiting This list may not describe all possible side effects. Call your doctor for medical advice about side effects. You may report side effects to FDA at 1-800-FDA-1088. Where should I keep my medicine? This drug is given in a hospital or clinic and will not be stored at home. NOTE: This sheet is a summary. It may not cover all possible information. If you have questions about this medicine, talk to your doctor, pharmacist, or health care provider.  2020 Elsevier/Gold Standard (2007-09-25 16:50:29)  Fluorouracil, 5-FU injection What is this medicine? FLUOROURACIL, 5-FU (flure oh YOOR a sil) is a chemotherapy drug. It slows the growth of cancer cells. This medicine is used to treat many types of cancer like breast cancer,   colon or rectal cancer, pancreatic cancer, and stomach cancer. This medicine may be used for other purposes; ask your health care provider or pharmacist if you have questions. COMMON BRAND NAME(S): Adrucil What should I tell my health care provider before I take this medicine? They need to know if you have any of these conditions:  blood disorders  dihydropyrimidine dehydrogenase (DPD) deficiency  infection (especially a virus infection such as chickenpox, cold sores, or herpes)  kidney disease  liver disease  malnourished, poor nutrition  recent or ongoing radiation therapy  an unusual or allergic reaction to fluorouracil, other chemotherapy, other medicines, foods, dyes, or preservatives  pregnant or trying to get pregnant  breast-feeding How should I use this medicine? This drug is given as an infusion or injection into a vein. It is administered in a  hospital or clinic by a specially trained health care professional. Talk to your pediatrician regarding the use of this medicine in children. Special care may be needed. Overdosage: If you think you have taken too much of this medicine contact a poison control center or emergency room at once. NOTE: This medicine is only for you. Do not share this medicine with others. What if I miss a dose? It is important not to miss your dose. Call your doctor or health care professional if you are unable to keep an appointment. What may interact with this medicine?  allopurinol  cimetidine  dapsone  digoxin  hydroxyurea  leucovorin  levamisole  medicines for seizures like ethotoin, fosphenytoin, phenytoin  medicines to increase blood counts like filgrastim, pegfilgrastim, sargramostim  medicines that treat or prevent blood clots like warfarin, enoxaparin, and dalteparin  methotrexate  metronidazole  pyrimethamine  some other chemotherapy drugs like busulfan, cisplatin, estramustine, vinblastine  trimethoprim  trimetrexate  vaccines Talk to your doctor or health care professional before taking any of these medicines:  acetaminophen  aspirin  ibuprofen  ketoprofen  naproxen This list may not describe all possible interactions. Give your health care provider a list of all the medicines, herbs, non-prescription drugs, or dietary supplements you use. Also tell them if you smoke, drink alcohol, or use illegal drugs. Some items may interact with your medicine. What should I watch for while using this medicine? Visit your doctor for checks on your progress. This drug may make you feel generally unwell. This is not uncommon, as chemotherapy can affect healthy cells as well as cancer cells. Report any side effects. Continue your course of treatment even though you feel ill unless your doctor tells you to stop. In some cases, you may be given additional medicines to help with side  effects. Follow all directions for their use. Call your doctor or health care professional for advice if you get a fever, chills or sore throat, or other symptoms of a cold or flu. Do not treat yourself. This drug decreases your body's ability to fight infections. Try to avoid being around people who are sick. This medicine may increase your risk to bruise or bleed. Call your doctor or health care professional if you notice any unusual bleeding. Be careful brushing and flossing your teeth or using a toothpick because you may get an infection or bleed more easily. If you have any dental work done, tell your dentist you are receiving this medicine. Avoid taking products that contain aspirin, acetaminophen, ibuprofen, naproxen, or ketoprofen unless instructed by your doctor. These medicines may hide a fever. Do not become pregnant while taking this medicine. Women should inform their   doctor if they wish to become pregnant or think they might be pregnant. There is a potential for serious side effects to an unborn child. Talk to your health care professional or pharmacist for more information. Do not breast-feed an infant while taking this medicine. Men should inform their doctor if they wish to father a child. This medicine may lower sperm counts. Do not treat diarrhea with over the counter products. Contact your doctor if you have diarrhea that lasts more than 2 days or if it is severe and watery. This medicine can make you more sensitive to the sun. Keep out of the sun. If you cannot avoid being in the sun, wear protective clothing and use sunscreen. Do not use sun lamps or tanning beds/booths. What side effects may I notice from receiving this medicine? Side effects that you should report to your doctor or health care professional as soon as possible:  allergic reactions like skin rash, itching or hives, swelling of the face, lips, or tongue  low blood counts - this medicine may decrease the number of  white blood cells, red blood cells and platelets. You may be at increased risk for infections and bleeding.  signs of infection - fever or chills, cough, sore throat, pain or difficulty passing urine  signs of decreased platelets or bleeding - bruising, pinpoint red spots on the skin, black, tarry stools, blood in the urine  signs of decreased red blood cells - unusually weak or tired, fainting spells, lightheadedness  breathing problems  changes in vision  chest pain  mouth sores  nausea and vomiting  pain, swelling, redness at site where injected  pain, tingling, numbness in the hands or feet  redness, swelling, or sores on hands or feet  stomach pain  unusual bleeding Side effects that usually do not require medical attention (report to your doctor or health care professional if they continue or are bothersome):  changes in finger or toe nails  diarrhea  dry or itchy skin  hair loss  headache  loss of appetite  sensitivity of eyes to the light  stomach upset  unusually teary eyes This list may not describe all possible side effects. Call your doctor for medical advice about side effects. You may report side effects to FDA at 1-800-FDA-1088. Where should I keep my medicine? This drug is given in a hospital or clinic and will not be stored at home. NOTE: This sheet is a summary. It may not cover all possible information. If you have questions about this medicine, talk to your doctor, pharmacist, or health care provider.  2020 Elsevier/Gold Standard (2007-07-25 13:53:16)  

## 2019-06-25 NOTE — Patient Instructions (Signed)

## 2019-06-25 NOTE — Progress Notes (Signed)
Nutrition follow up completed with patient during infusion for gastric cancer s/p distal gastrectomy on 03/19/19. He is starting his 3rd cycle of FOLFOX and then will begin radiation and Xeloda. His feeding tube is removed and he is eating well. Reports good appetite and states he is trying not to eat too much because he would like to maintain his current weight of 170.8 pounds. He is drinking mostly water and decaffeinated beverages. He has no questions or concerns.  Inadequate oral intake resolved. Patient has my contact number for questions or concerns.

## 2019-06-25 NOTE — Progress Notes (Signed)
Per Dr. Ennever, okay to treat today despite labs ?

## 2019-06-25 NOTE — Progress Notes (Signed)
Hematology and Oncology Follow Up Visit  Jonathan Lewis [id number] [birth year] 79 y.o. 06/25/2019   Principle Diagnosis:   Stage IIIA (T3N2M0) adenocarcinoma of the stomach --  S/p distal gastrectomy on 03/19/2019  -- HER2-/PD-L1+  Current Therapy:    Adjuvant FOLFOX -  S/p cycle 2/4 - start on 05/13/2019     Interim History:  Jonathan Lewis is in for his follow-up.  His feeding tube is now out.  Thankfully, this is a whole lot better for him.  He is able to eat more.  His weight is up 3 pounds.  He has had really no problems with treatment.  Blood counts are down a little bit.  Because this, I want to hold his bolus 5-FU.  He has had no fever.  He has had no problems with diarrhea.  He has had no bleeding.  He has had no leg swelling.  Overall, I would say his performance status is ECOG 1.    Medications:  Current Outpatient Medications:  .  acetaminophen (TYLENOL) 160 MG/5ML solution, Place 20.3 mLs (650 mg total) into feeding tube every 6 (six) hours as needed for mild pain or moderate pain. (Patient taking differently: Place 640 mg into feeding tube every 6 (six) hours as needed for mild pain or moderate pain. ), Disp: 120 mL, Rfl: 0 .  dexamethasone (DECADRON) 1 MG/ML solution, Place 8 mLs (8 mg total) into feeding tube daily. Start day after chemotherapy for 2 days total., Disp: 30 mL, Rfl: 3 .  docusate (COLACE) 50 MG/5ML liquid, Place 10 mLs (100 mg total) into feeding tube daily., Disp: 100 mL, Rfl: 0 .  gabapentin (NEURONTIN) 250 MG/5ML solution, Place 2 mLs (100 mg total) into feeding tube every 12 (twelve) hours., Disp: 120 mL, Rfl: 0 .  latanoprost (XALATAN) 0.005 % ophthalmic solution, Place 1 drop into both eyes at bedtime. , Disp: , Rfl:  .  lidocaine-prilocaine (EMLA) cream, Apply 1 application topically as needed. Place on the port one hour before appointment., Disp: 30 g, Rfl: 3 .  LORazepam (ATIVAN) 2 MG/ML concentrated solution, Take 1.2 mg by mouth every 6 (six)  hours as needed for anxiety., Disp: , Rfl:  .  ondansetron (ZOFRAN) 4 MG/5ML solution, Place 31ms (854m into tube every 12 hours as needed for nausea/vomiting. Start 3 days after chemotherapy., Disp: 100 mL, Rfl: 3 .  SYSTANE ULTRA 0.4-0.3 % SOLN, Apply 1 drop to eye 3 (three) times daily as needed (dry eyes). , Disp: , Rfl:  .  famotidine (PEPCID) 40 MG/5ML suspension, Place 2.5 mLs (20 mg total) into feeding tube daily. (Patient not taking: Reported on 05/20/2019), Disp: 75 mL, Rfl: 0  Allergies: No Known Allergies  Past Medical History, Surgical history, Social history, and Family History were reviewed and updated.  Review of Systems: Review of Systems  Constitutional: Positive for appetite change, fatigue and unexpected weight change.  HENT:  Negative.   Eyes: Negative.   Respiratory: Negative.   Cardiovascular: Negative.   Gastrointestinal: Positive for abdominal pain, nausea and vomiting.  Endocrine: Negative.   Genitourinary: Negative.    Musculoskeletal: Negative.   Skin: Positive for itching.  Neurological: Negative.   Hematological: Negative.   Psychiatric/Behavioral: Negative.     Physical Exam:  height is _0  (1.727 m) and weight is 170 lb 12.8 oz (77.5 kg). His temporal temperature is 98.2 F (36.8 C). His blood pressure is 120/75 and his pulse is 68. His respiration is 20 and oxygen saturation is  100%.   Wt Readings from Last 3 Encounters:  06/25/19 170 lb 12.8 oz (77.5 kg)  06/25/19 170 lb 12.8 oz (77.5 kg)  06/04/19 165 lb 0.8 oz (74.9 kg)    Physical Exam Vitals reviewed.  HENT:     Head: Normocephalic and atraumatic.  Eyes:     Pupils: Pupils are equal, round, and reactive to light.  Cardiovascular:     Rate and Rhythm: Normal rate and regular rhythm.     Heart sounds: Normal heart sounds.  Pulmonary:     Effort: Pulmonary effort is normal.     Breath sounds: Normal breath sounds.  Abdominal:     General: Bowel sounds are normal.     Palpations:  Abdomen is soft.     Comments: Abdominal exam shows healing laparotomy scar.  His feeding tube has been removed.  The insertion sites of his catheters are healed.  There is no fluid wave.  There is no abdominal mass.  There is no palpable liver or spleen tip.  Musculoskeletal:        General: No tenderness or deformity. Normal range of motion.     Cervical back: Normal range of motion.  Lymphadenopathy:     Cervical: No cervical adenopathy.  Skin:    General: Skin is warm and dry.     Findings: No erythema or rash.  Neurological:     Mental Status: He is alert and oriented to person, place, and time.  Psychiatric:        Behavior: Behavior normal.        Thought Content: Thought content normal.        Judgment: Judgment normal.      Lab Results  Component Value Date   WBC 2.7 (L) 06/25/2019   HGB 10.3 (L) 06/25/2019   HCT 31.6 (L) 06/25/2019   MCV 87.5 06/25/2019   PLT 165 06/25/2019     Chemistry      Component Value Date/Time   NA 141 06/25/2019 0800   K 3.9 06/25/2019 0800   CL 109 06/25/2019 0800   CO2 27 06/25/2019 0800   BUN 12 06/25/2019 0800   CREATININE 0.78 06/25/2019 0800   CREATININE 1.06 07/16/2012 1038      Component Value Date/Time   CALCIUM 8.7 (L) 06/25/2019 0800   ALKPHOS 48 06/25/2019 0800   AST 13 (L) 06/25/2019 0800   ALT 9 06/25/2019 0800   BILITOT 0.4 06/25/2019 0800      Impression and Plan: Jonathan Lewis is an 79 year old African-American male.  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  We will proceed with a third cycle of treatment.  We will have 4 cycles of FOLFOX and then we will switch over to radiation and Xeloda.  I am just happy that he is able to eat now.  Hopefully, his weight will continue to improve.  We will plan to get him back to see [place] in another couple weeks.        Volanda Napoleon, MD 3/23/20218:57 AM

## 2019-06-26 MED ORDER — LORAZEPAM 1 MG PO TABS: 1.0000 mg | ORAL_TABLET | Freq: Four times a day (QID) | ORAL | 0 refills | Status: DC | PRN

## 2019-06-27 ENCOUNTER — Other Ambulatory Visit: Payer: Self-pay

## 2019-06-27 ENCOUNTER — Inpatient Hospital Stay: Payer: Medicare PPO

## 2019-06-27 VITALS — BP 124/83 | HR 64 | Temp 98.3°F | Resp 19

## 2019-06-27 DIAGNOSIS — Z79899 Other long term (current) drug therapy: Secondary | ICD-10-CM | POA: Diagnosis not present

## 2019-06-27 DIAGNOSIS — C162 Malignant neoplasm of body of stomach: Secondary | ICD-10-CM

## 2019-06-27 DIAGNOSIS — Z5111 Encounter for antineoplastic chemotherapy: Secondary | ICD-10-CM | POA: Diagnosis not present

## 2019-06-27 DIAGNOSIS — Z7952 Long term (current) use of systemic steroids: Secondary | ICD-10-CM | POA: Diagnosis not present

## 2019-06-27 DIAGNOSIS — C169 Malignant neoplasm of stomach, unspecified: Secondary | ICD-10-CM | POA: Diagnosis not present

## 2019-06-27 LAB — FUNGUS CULTURE RESULT

## 2019-06-27 LAB — FUNGUS CULTURE WITH STAIN

## 2019-06-27 LAB — FUNGAL ORGANISM REFLEX

## 2019-06-27 MED ORDER — HEPARIN SOD (PORK) LOCK FLUSH 100 UNIT/ML IV SOLN
500.0000 [IU] | Freq: Once | INTRAVENOUS | Status: AC | PRN
  Administered 2019-06-27: 500 [IU]
  Filled 2019-06-27: qty 5

## 2019-06-27 MED ORDER — SODIUM CHLORIDE 0.9% FLUSH
10.0000 mL | INTRAVENOUS | Status: DC | PRN
  Administered 2019-06-27: 10 mL
  Filled 2019-06-27: qty 10

## 2019-06-27 NOTE — Patient Instructions (Signed)
Fluorouracil, 5-FU injection What is this medicine? FLUOROURACIL, 5-FU (flure oh YOOR a sil) is a chemotherapy drug. It slows the growth of cancer cells. This medicine is used to treat many types of cancer like breast cancer, colon or rectal cancer, pancreatic cancer, and stomach cancer. This medicine may be used for other purposes; ask your health care provider or pharmacist if you have questions. COMMON BRAND NAME(S): Adrucil What should I tell my health care provider before I take this medicine? They need to know if you have any of these conditions:  blood disorders  dihydropyrimidine dehydrogenase (DPD) deficiency  infection (especially a virus infection such as chickenpox, cold sores, or herpes)  kidney disease  liver disease  malnourished, poor nutrition  recent or ongoing radiation therapy  an unusual or allergic reaction to fluorouracil, other chemotherapy, other medicines, foods, dyes, or preservatives  pregnant or trying to get pregnant  breast-feeding How should I use this medicine? This drug is given as an infusion or injection into a vein. It is administered in a hospital or clinic by a specially trained health care professional. Talk to your pediatrician regarding the use of this medicine in children. Special care may be needed. Overdosage: If you think you have taken too much of this medicine contact a poison control center or emergency room at once. NOTE: This medicine is only for you. Do not share this medicine with others. What if I miss a dose? It is important not to miss your dose. Call your doctor or health care professional if you are unable to keep an appointment. What may interact with this medicine?  allopurinol  cimetidine  dapsone  digoxin  hydroxyurea  leucovorin  levamisole  medicines for seizures like ethotoin, fosphenytoin, phenytoin  medicines to increase blood counts like filgrastim, pegfilgrastim, sargramostim  medicines that  treat or prevent blood clots like warfarin, enoxaparin, and dalteparin  methotrexate  metronidazole  pyrimethamine  some other chemotherapy drugs like busulfan, cisplatin, estramustine, vinblastine  trimethoprim  trimetrexate  vaccines Talk to your doctor or health care professional before taking any of these medicines:  acetaminophen  aspirin  ibuprofen  ketoprofen  naproxen This list may not describe all possible interactions. Give your health care provider a list of all the medicines, herbs, non-prescription drugs, or dietary supplements you use. Also tell them if you smoke, drink alcohol, or use illegal drugs. Some items may interact with your medicine. What should I watch for while using this medicine? Visit your doctor for checks on your progress. This drug may make you feel generally unwell. This is not uncommon, as chemotherapy can affect healthy cells as well as cancer cells. Report any side effects. Continue your course of treatment even though you feel ill unless your doctor tells you to stop. In some cases, you may be given additional medicines to help with side effects. Follow all directions for their use. Call your doctor or health care professional for advice if you get a fever, chills or sore throat, or other symptoms of a cold or flu. Do not treat yourself. This drug decreases your body's ability to fight infections. Try to avoid being around people who are sick. This medicine may increase your risk to bruise or bleed. Call your doctor or health care professional if you notice any unusual bleeding. Be careful brushing and flossing your teeth or using a toothpick because you may get an infection or bleed more easily. If you have any dental work done, tell your dentist you are   receiving this medicine. Avoid taking products that contain aspirin, acetaminophen, ibuprofen, naproxen, or ketoprofen unless instructed by your doctor. These medicines may hide a fever. Do not  become pregnant while taking this medicine. Women should inform their doctor if they wish to become pregnant or think they might be pregnant. There is a potential for serious side effects to an unborn child. Talk to your health care professional or pharmacist for more information. Do not breast-feed an infant while taking this medicine. Men should inform their doctor if they wish to father a child. This medicine may lower sperm counts. Do not treat diarrhea with over the counter products. Contact your doctor if you have diarrhea that lasts more than 2 days or if it is severe and watery. This medicine can make you more sensitive to the sun. Keep out of the sun. If you cannot avoid being in the sun, wear protective clothing and use sunscreen. Do not use sun lamps or tanning beds/booths. What side effects may I notice from receiving this medicine? Side effects that you should report to your doctor or health care professional as soon as possible:  allergic reactions like skin rash, itching or hives, swelling of the face, lips, or tongue  low blood counts - this medicine may decrease the number of white blood cells, red blood cells and platelets. You may be at increased risk for infections and bleeding.  signs of infection - fever or chills, cough, sore throat, pain or difficulty passing urine  signs of decreased platelets or bleeding - bruising, pinpoint red spots on the skin, black, tarry stools, blood in the urine  signs of decreased red blood cells - unusually weak or tired, fainting spells, lightheadedness  breathing problems  changes in vision  chest pain  mouth sores  nausea and vomiting  pain, swelling, redness at site where injected  pain, tingling, numbness in the hands or feet  redness, swelling, or sores on hands or feet  stomach pain  unusual bleeding Side effects that usually do not require medical attention (report to your doctor or health care professional if they  continue or are bothersome):  changes in finger or toe nails  diarrhea  dry or itchy skin  hair loss  headache  loss of appetite  sensitivity of eyes to the light  stomach upset  unusually teary eyes This list may not describe all possible side effects. Call your doctor for medical advice about side effects. You may report side effects to FDA at 1-800-FDA-1088. Where should I keep my medicine? This drug is given in a hospital or clinic and will not be stored at home. NOTE: This sheet is a summary. It may not cover all possible information. If you have questions about this medicine, talk to your doctor, pharmacist, or health care provider.  2020 Elsevier/Gold Standard (2007-07-25 13:53:16)  

## 2019-07-01 DIAGNOSIS — C169 Malignant neoplasm of stomach, unspecified: Secondary | ICD-10-CM | POA: Diagnosis not present

## 2019-07-01 DIAGNOSIS — Z483 Aftercare following surgery for neoplasm: Secondary | ICD-10-CM | POA: Diagnosis not present

## 2019-07-01 DIAGNOSIS — E119 Type 2 diabetes mellitus without complications: Secondary | ICD-10-CM | POA: Diagnosis not present

## 2019-07-01 DIAGNOSIS — Z434 Encounter for attention to other artificial openings of digestive tract: Secondary | ICD-10-CM | POA: Diagnosis not present

## 2019-07-01 DIAGNOSIS — K311 Adult hypertrophic pyloric stenosis: Secondary | ICD-10-CM | POA: Diagnosis not present

## 2019-07-01 DIAGNOSIS — Z9889 Other specified postprocedural states: Secondary | ICD-10-CM | POA: Diagnosis not present

## 2019-07-01 DIAGNOSIS — Z431 Encounter for attention to gastrostomy: Secondary | ICD-10-CM | POA: Diagnosis not present

## 2019-07-01 DIAGNOSIS — Z48815 Encounter for surgical aftercare following surgery on the digestive system: Secondary | ICD-10-CM | POA: Diagnosis not present

## 2019-07-01 DIAGNOSIS — M6281 Muscle weakness (generalized): Secondary | ICD-10-CM | POA: Diagnosis not present

## 2019-07-04 DIAGNOSIS — C169 Malignant neoplasm of stomach, unspecified: Secondary | ICD-10-CM | POA: Diagnosis not present

## 2019-07-04 DIAGNOSIS — E440 Moderate protein-calorie malnutrition: Secondary | ICD-10-CM | POA: Diagnosis not present

## 2019-07-05 DIAGNOSIS — C162 Malignant neoplasm of body of stomach: Secondary | ICD-10-CM | POA: Diagnosis not present

## 2019-07-07 LAB — ACID FAST CULTURE WITH REFLEXED SENSITIVITIES (MYCOBACTERIA)
Acid Fast Culture: NEGATIVE
Acid Fast Culture: NEGATIVE

## 2019-07-08 NOTE — Progress Notes (Signed)
Patient will not receive fluorouracil bolus on 07/15/19.  Order deleted per Dr. Antonieta Pert instructions.  Pharmacist Chemotherapy Monitoring - Follow Up Assessment    I verify that I have reviewed each item in the below checklist:  . Regimen for the patient is scheduled for the appropriate day and plan matches scheduled date. Marland Kitchen Appropriate non-routine labs are ordered dependent on drug ordered. . If applicable, additional medications reviewed and ordered per protocol based on lifetime cumulative doses and/or treatment regimen.   Plan for follow-up and/or issues identified: No . I-vent associated with next due treatment: No . MD and/or nursing notified: No  Caira Poche, [name] 07/08/2019 11:11 AM

## 2019-07-09 ENCOUNTER — Ambulatory Visit: Payer: Medicare PPO

## 2019-07-09 ENCOUNTER — Other Ambulatory Visit: Payer: Medicare PPO

## 2019-07-09 ENCOUNTER — Ambulatory Visit: Payer: Medicare PPO | Admitting: Hematology & Oncology

## 2019-07-11 ENCOUNTER — Encounter: Payer: Self-pay | Admitting: Hematology & Oncology

## 2019-07-15 ENCOUNTER — Encounter: Payer: Self-pay | Admitting: Family

## 2019-07-15 ENCOUNTER — Other Ambulatory Visit: Payer: Self-pay

## 2019-07-15 ENCOUNTER — Inpatient Hospital Stay: Payer: Medicare PPO | Attending: Hematology & Oncology

## 2019-07-15 ENCOUNTER — Inpatient Hospital Stay (HOSPITAL_BASED_OUTPATIENT_CLINIC_OR_DEPARTMENT_OTHER): Payer: Medicare PPO | Admitting: Family

## 2019-07-15 ENCOUNTER — Inpatient Hospital Stay: Payer: Medicare PPO

## 2019-07-15 ENCOUNTER — Encounter: Payer: Self-pay | Admitting: *Deleted

## 2019-07-15 VITALS — BP 116/69 | HR 59 | Temp 97.7°F | Resp 17 | Ht 68.0 in | Wt 171.0 lb

## 2019-07-15 DIAGNOSIS — C162 Malignant neoplasm of body of stomach: Secondary | ICD-10-CM

## 2019-07-15 DIAGNOSIS — Z79899 Other long term (current) drug therapy: Secondary | ICD-10-CM | POA: Insufficient documentation

## 2019-07-15 DIAGNOSIS — Z5111 Encounter for antineoplastic chemotherapy: Secondary | ICD-10-CM | POA: Diagnosis not present

## 2019-07-15 DIAGNOSIS — Z7952 Long term (current) use of systemic steroids: Secondary | ICD-10-CM | POA: Insufficient documentation

## 2019-07-15 DIAGNOSIS — Z903 Acquired absence of stomach [part of]: Secondary | ICD-10-CM | POA: Insufficient documentation

## 2019-07-15 DIAGNOSIS — D509 Iron deficiency anemia, unspecified: Secondary | ICD-10-CM

## 2019-07-15 DIAGNOSIS — C169 Malignant neoplasm of stomach, unspecified: Secondary | ICD-10-CM | POA: Insufficient documentation

## 2019-07-15 LAB — CBC WITH DIFFERENTIAL (CANCER CENTER ONLY)
Abs Immature Granulocytes: 0 10*3/uL (ref 0.00–0.07)
Basophils Absolute: 0 10*3/uL (ref 0.0–0.1)
Basophils Relative: 0 %
Eosinophils Absolute: 0 10*3/uL (ref 0.0–0.5)
Eosinophils Relative: 1 %
HCT: 32.8 % — ABNORMAL LOW (ref 39.0–52.0)
Hemoglobin: 10.7 g/dL — ABNORMAL LOW (ref 13.0–17.0)
Immature Granulocytes: 0 %
Lymphocytes Relative: 50 %
Lymphs Abs: 1.1 10*3/uL (ref 0.7–4.0)
MCH: 28.8 pg (ref 26.0–34.0)
MCHC: 32.6 g/dL (ref 30.0–36.0)
MCV: 88.2 fL (ref 80.0–100.0)
Monocytes Absolute: 0.3 10*3/uL (ref 0.1–1.0)
Monocytes Relative: 15 %
Neutro Abs: 0.8 10*3/uL — ABNORMAL LOW (ref 1.7–7.7)
Neutrophils Relative %: 34 %
Platelet Count: 171 10*3/uL (ref 150–400)
RBC: 3.72 MIL/uL — ABNORMAL LOW (ref 4.22–5.81)
RDW: 14.1 % (ref 11.5–15.5)
WBC Count: 2.3 10*3/uL — ABNORMAL LOW (ref 4.0–10.5)
nRBC: 0 % (ref 0.0–0.2)

## 2019-07-15 LAB — CMP (CANCER CENTER ONLY)
ALT: 10 U/L (ref 0–44)
AST: 16 U/L (ref 15–41)
Albumin: 3.6 g/dL (ref 3.5–5.0)
Alkaline Phosphatase: 44 U/L (ref 38–126)
Anion gap: 6 (ref 5–15)
BUN: 18 mg/dL (ref 8–23)
CO2: 26 mmol/L (ref 22–32)
Calcium: 9.1 mg/dL (ref 8.9–10.3)
Chloride: 109 mmol/L (ref 98–111)
Creatinine: 0.73 mg/dL (ref 0.61–1.24)
GFR, Est AFR Am: >60 mL/min (ref >60)
GFR, Estimated: >60 mL/min (ref >60)
Glucose, Bld: 163 mg/dL — ABNORMAL HIGH (ref 70–99)
Potassium: 3.6 mmol/L (ref 3.5–5.1)
Sodium: 141 mmol/L (ref 135–145)
Total Bilirubin: 0.4 mg/dL (ref 0.3–1.2)
Total Protein: 5.9 g/dL — ABNORMAL LOW (ref 6.5–8.1)

## 2019-07-15 MED ORDER — PALONOSETRON HCL INJECTION 0.25 MG/5ML
INTRAVENOUS | Status: AC
  Filled 2019-07-15: qty 5

## 2019-07-15 MED ORDER — OXALIPLATIN CHEMO INJECTION 100 MG/20ML
68.0000 mg/m2 | Freq: Once | INTRAVENOUS | Status: AC
  Administered 2019-07-15: 120 mg via INTRAVENOUS
  Filled 2019-07-15: qty 20

## 2019-07-15 MED ORDER — SODIUM CHLORIDE 0.9 % IV SOLN
1920.0000 mg/m2 | INTRAVENOUS | Status: DC
  Administered 2019-07-15: 3400 mg via INTRAVENOUS
  Filled 2019-07-15: qty 68

## 2019-07-15 MED ORDER — LEUCOVORIN CALCIUM INJECTION 350 MG
400.0000 mg/m2 | Freq: Once | INTRAVENOUS | Status: AC
  Administered 2019-07-15: 712 mg via INTRAVENOUS
  Filled 2019-07-15: qty 35.6

## 2019-07-15 MED ORDER — PALONOSETRON HCL INJECTION 0.25 MG/5ML
0.2500 mg | Freq: Once | INTRAVENOUS | Status: AC
  Administered 2019-07-15: 0.25 mg via INTRAVENOUS

## 2019-07-15 MED ORDER — DEXTROSE 5 % IV SOLN
Freq: Once | INTRAVENOUS | Status: AC
  Filled 2019-07-15: qty 250

## 2019-07-15 MED ORDER — SODIUM CHLORIDE 0.9 % IV SOLN
10.0000 mg | Freq: Once | INTRAVENOUS | Status: AC
  Administered 2019-07-15: 10 mg via INTRAVENOUS
  Filled 2019-07-15: qty 10

## 2019-07-15 NOTE — Patient Instructions (Signed)
[facility] Discharge Instructions for Patients Receiving Chemotherapy  Today you received the following chemotherapy agents Oxaliplatin, Leucovorin, 5FU  To help prevent nausea and vomiting after your treatment, we encourage you to take your nausea medication as directed   If you develop nausea and vomiting that is not controlled by your nausea medication, call the clinic.   BELOW ARE SYMPTOMS THAT SHOULD BE REPORTED IMMEDIATELY:  *FEVER GREATER THAN 100.5 F  *CHILLS WITH OR WITHOUT FEVER  NAUSEA AND VOMITING THAT IS NOT CONTROLLED WITH YOUR NAUSEA MEDICATION  *UNUSUAL SHORTNESS OF BREATH  *UNUSUAL BRUISING OR BLEEDING  TENDERNESS IN MOUTH AND THROAT WITH OR WITHOUT PRESENCE OF ULCERS  *URINARY PROBLEMS  *BOWEL PROBLEMS  UNUSUAL RASH Items with * indicate a potential emergency and should be followed up as soon as possible.  Feel free to call the clinic should you have any questions or concerns. The clinic phone number is [phone].  Please show the CHEMO ALERT CARD at check-in to the Emergency Department and triage nurse.   

## 2019-07-15 NOTE — Patient Instructions (Signed)

## 2019-07-15 NOTE — Progress Notes (Signed)
Reviewed pt labs with Dr. Ennever and pt ok to treat with ANC 0.8 

## 2019-07-15 NOTE — Progress Notes (Signed)
Hematology and Oncology Follow Up Visit  Jonathan Lewis [id number] 09/28/1940 79 y.o. 07/15/2019   Principle Diagnosis:  Stage IIIA (T3N2M0) adenocarcinoma of the stomach --  S/p distal gastrectomy on 03/19/2019  -- HER2-/PD-L1+  Current Therapy:        Adjuvant FOLFOX - started on 05/13/2019, s/p cycle 3/4   Interim History:  Jonathan Lewis is here today for follow-up. He is doing well and has no complaints at this time.  Hgb is stable at 10.7, MCV 88, WBC count 2.3, platelets 171.  No fever, chills, n/v, cough, rash, dizziness, SOB, chest pain, palpitations or changes in bowel or bladder habits.  He has an occasional twinge of discomfort in the left side and bloating of the abdomen which all resolves with a bowel movement.  No bleeding, bruising or petechiae.  No swelling or tenderness in his extremities.  He gets intermittent numbness and tingling in his fingertips and hands.  He states that he has a good appetite and is hydrating well with water. His weight is stable.   ECOG Performance Status: 1 - Symptomatic but completely ambulatory  Medications:  Allergies as of 07/15/2019   No Known Allergies     Medication List       Accurate as of July 15, 2019 10:19 AM. If you have any questions, ask your nurse or doctor.        acetaminophen 325 MG tablet Commonly known as: Tylenol Take 2 tablets (650 mg total) by mouth every 6 (six) hours as needed for mild pain.   dexamethasone 4 MG tablet Commonly known as: DECADRON Take 2 tablets (8 mg total) by mouth daily. Start day after chemotherapy for two days total.   docusate sodium 100 MG capsule Commonly known as: COLACE Take 1 capsule (100 mg total) by mouth daily.   famotidine 20 MG tablet Commonly known as: PEPCID Take 1 tablet (20 mg total) by mouth daily.   gabapentin 100 MG capsule Commonly known as: NEURONTIN Take 1 capsule (100 mg total) by mouth every 12 (twelve) hours.   latanoprost 0.005 % ophthalmic  solution Commonly known as: XALATAN Place 1 drop into both eyes at bedtime.   lidocaine-prilocaine cream Commonly known as: EMLA Apply 1 application topically as needed. Place on the port one hour before appointment.   LORazepam 1 MG tablet Commonly known as: ATIVAN Take 1 tablet (1 mg total) by mouth every 6 (six) hours as needed for anxiety.   ondansetron 8 MG tablet Commonly known as: ZOFRAN Take one tablet by mouth every 12 hours as needed for nausea/vomiting. Start 3 days after chemotherapy.   Systane Ultra 0.4-0.3 % Soln Generic drug: Polyethyl Glycol-Propyl Glycol Apply 1 drop to eye 3 (three) times daily as needed (dry eyes).       Allergies: No Known Allergies  Past Medical History, Surgical history, Social history, and Family History were reviewed and updated.  Review of Systems: All other 10 point review of systems is negative.   Physical Exam:  height is 5' 8" (1.727 m) and weight is 171 lb (77.6 kg). His temporal temperature is 97.7 F (36.5 C). His blood pressure is 116/69 and his pulse is 59 (abnormal). His respiration is 17 and oxygen saturation is 100%.   Wt Readings from Last 3 Encounters:  07/15/19 171 lb (77.6 kg)  06/25/19 170 lb 12.8 oz (77.5 kg)  06/25/19 170 lb 12.8 oz (77.5 kg)    Ocular: Sclerae unicteric, pupils equal, round and reactive to light  Ear-nose-throat: Oropharynx clear, dentition fair Lymphatic: No cervical or supraclavicular adenopathy Lungs no rales or rhonchi, good excursion bilaterally Heart regular rate and rhythm, no murmur appreciated Abd soft, nontender, positive bowel sounds, no liver or spleen tip palpated on exam, no fluid wave  MSK no focal spinal tenderness, no joint edema Neuro: non-focal, well-oriented, appropriate affect Breasts: Deferred   Lab Results  Component Value Date   WBC 2.3 (L) 07/15/2019   HGB 10.7 (L) 07/15/2019   HCT 32.8 (L) 07/15/2019   MCV 88.2 07/15/2019   PLT 171 07/15/2019   Lab Results    Component Value Date   FERRITIN 77 06/25/2019   IRON 69 06/25/2019   TIBC 281 06/25/2019   UIBC 212 06/25/2019   IRONPCTSAT 25 06/25/2019   Lab Results  Component Value Date   RBC 3.72 (L) 07/15/2019   No results found for: KPAFRELGTCHN, LAMBDASER, KAPLAMBRATIO No results found for: IGGSERUM, IGA, IGMSERUM No results found for: Ronnald Ramp, A1GS, A2GS, Violet Baldy, MSPIKE, SPEI   Chemistry      Component Value Date/Time   NA 141 07/15/2019 0900   K 3.6 07/15/2019 0900   CL 109 07/15/2019 0900   CO2 26 07/15/2019 0900   BUN 18 07/15/2019 0900   CREATININE 0.73 07/15/2019 0900   CREATININE 1.06 07/16/2012 1038      Component Value Date/Time   CALCIUM 9.1 07/15/2019 0900   ALKPHOS 44 07/15/2019 0900   AST 16 07/15/2019 0900   ALT 10 07/15/2019 0900   BILITOT 0.4 07/15/2019 0900       Impression and Plan: Jonathan Lewis is a very pleasant 79 yo African American gentleman with stage IIIA (T3N2M0) adenocarcinoma of the stomach, HER2-/PD-L1+. He had a distal gastrectomy on 03/19/2019. He is currently receiving FOLFOX without the bolus and tolerating nicely. His counts are holding steady.  Today we will proceed with cycle 4/4.  Jonathan Lewis is still onsidering whether or not to have the patient proceed with radiation and Xeloda therapy.  We will see him again for follow-up in 3 weeks and discuss at that time.  He will contact our office with any questions or concerns. We can certainly see him sooner if needed.   Laverna Peace, NP 4/12/202110:19 AM

## 2019-07-16 ENCOUNTER — Ambulatory Visit: Payer: Medicare PPO

## 2019-07-16 ENCOUNTER — Other Ambulatory Visit: Payer: Medicare PPO

## 2019-07-16 ENCOUNTER — Telehealth: Payer: Self-pay | Admitting: Hematology & Oncology

## 2019-07-16 ENCOUNTER — Ambulatory Visit: Payer: Medicare PPO | Admitting: Hematology & Oncology

## 2019-07-16 ENCOUNTER — Other Ambulatory Visit: Payer: Self-pay | Admitting: Hematology & Oncology

## 2019-07-16 NOTE — Telephone Encounter (Signed)
Appointments scheduled calendar printed & mailed per 4/12 los

## 2019-07-17 ENCOUNTER — Other Ambulatory Visit: Payer: Self-pay

## 2019-07-17 ENCOUNTER — Inpatient Hospital Stay: Payer: Medicare PPO

## 2019-07-17 ENCOUNTER — Ambulatory Visit: Payer: Medicare PPO | Attending: Internal Medicine

## 2019-07-17 DIAGNOSIS — Z23 Encounter for immunization: Secondary | ICD-10-CM

## 2019-07-17 DIAGNOSIS — Z5111 Encounter for antineoplastic chemotherapy: Secondary | ICD-10-CM | POA: Diagnosis not present

## 2019-07-17 DIAGNOSIS — C162 Malignant neoplasm of body of stomach: Secondary | ICD-10-CM

## 2019-07-17 DIAGNOSIS — Z79899 Other long term (current) drug therapy: Secondary | ICD-10-CM | POA: Diagnosis not present

## 2019-07-17 DIAGNOSIS — Z903 Acquired absence of stomach [part of]: Secondary | ICD-10-CM | POA: Diagnosis not present

## 2019-07-17 DIAGNOSIS — Z7952 Long term (current) use of systemic steroids: Secondary | ICD-10-CM | POA: Diagnosis not present

## 2019-07-17 DIAGNOSIS — C169 Malignant neoplasm of stomach, unspecified: Secondary | ICD-10-CM | POA: Diagnosis not present

## 2019-07-17 MED ORDER — SODIUM CHLORIDE 0.9% FLUSH
10.0000 mL | INTRAVENOUS | Status: DC | PRN
  Administered 2019-07-17: 10 mL
  Filled 2019-07-17: qty 10

## 2019-07-17 MED ORDER — HEPARIN SOD (PORK) LOCK FLUSH 100 UNIT/ML IV SOLN
500.0000 [IU] | Freq: Once | INTRAVENOUS | Status: AC | PRN
  Administered 2019-07-17: 500 [IU]
  Filled 2019-07-17: qty 5

## 2019-07-17 NOTE — Progress Notes (Signed)
   Covid-19 Vaccination Clinic  Name:  Jonathan Lewis    MRN: [id number] DOB: 25-Apr-1940  07/17/2019  Jonathan Lewis was observed post Covid-19 immunization for 15 minutes without incident. He was provided with Vaccine Information Sheet and instruction to access the V-Safe system.   Jonathan Lewis was instructed to call 911 with any severe reactions post vaccine: Marland Kitchen Difficulty breathing  . Swelling of face and throat  . A fast heartbeat  . A bad rash all over body  . Dizziness and weakness   Immunizations Administered    Name Date Dose VIS Date Route   Pfizer COVID-19 Vaccine 07/17/2019 10:12 AM 0.3 mL 03/15/2019 Intramuscular   Manufacturer: [place]   Lot: [id number]   [place]: [id number]

## 2019-07-19 ENCOUNTER — Encounter: Payer: Self-pay | Admitting: Hematology & Oncology

## 2019-07-22 ENCOUNTER — Ambulatory Visit: Payer: Medicare PPO | Attending: Internal Medicine

## 2019-07-22 DIAGNOSIS — Z20822 Contact with and (suspected) exposure to covid-19: Secondary | ICD-10-CM

## 2019-07-23 LAB — SARS-COV-2, NAA 2 DAY TAT

## 2019-07-23 LAB — NOVEL CORONAVIRUS, NAA: SARS-CoV-2, NAA: DETECTED — AB

## 2019-07-24 ENCOUNTER — Telehealth: Payer: Self-pay | Admitting: Unknown Physician Specialty

## 2019-07-24 ENCOUNTER — Encounter: Payer: Self-pay | Admitting: Hematology & Oncology

## 2019-07-24 ENCOUNTER — Encounter: Payer: Self-pay | Admitting: Unknown Physician Specialty

## 2019-07-24 ENCOUNTER — Telehealth: Payer: Self-pay | Admitting: Adult Health

## 2019-07-24 NOTE — Telephone Encounter (Signed)
Called to discuss with patient about Covid symptoms and the use of bamlanivimab, a monoclonal antibody infusion for those with mild to moderate Covid symptoms and at a high risk of hospitalization.  Pt is qualified for this infusion at the [facility] infusion center due to Age > 65    Message left to call back  Touched base with Dr. Marin Olp who thought mab was appropriate for him

## 2019-07-24 NOTE — Telephone Encounter (Signed)
Called and talked with patient about his covid positivity and how he is at high risk for complications and/or hospitalization from the virus.  His symptoms began on 07/18/2019.  I reviewed that he is eligible to receive treatment with monoclonal antibody therapy to reduce his risk of complications and/or hospitalization.  Jonathan Lewis will think about this and has our call back number should he decide to receive treatment.   Jonathan Bihari, NP

## 2019-07-26 ENCOUNTER — Other Ambulatory Visit: Payer: Self-pay | Admitting: Physician Assistant

## 2019-07-26 DIAGNOSIS — E1159 Type 2 diabetes mellitus with other circulatory complications: Secondary | ICD-10-CM

## 2019-07-26 DIAGNOSIS — I152 Hypertension secondary to endocrine disorders: Secondary | ICD-10-CM

## 2019-07-26 DIAGNOSIS — U071 COVID-19: Secondary | ICD-10-CM

## 2019-07-26 DIAGNOSIS — C162 Malignant neoplasm of body of stomach: Secondary | ICD-10-CM

## 2019-07-26 NOTE — Progress Notes (Signed)
  I connected by phone with Jonathan Lewis on 07/26/2019 at 4:33 PM to discuss the potential use of an new treatment for mild to moderate COVID-19 viral infection in non-hospitalized patients.  This patient is a 79 y.o. male that meets the FDA criteria for Emergency Use Authorization of bamlanivimab/etesevimab or casirivimab/imdevimab.  Has a (+) direct SARS-CoV-2 viral test result  Has mild or moderate COVID-19   Is ? 80 years of age and weighs ? 40 kg  Is NOT hospitalized due to COVID-19  Is NOT requiring oxygen therapy or requiring an increase in baseline oxygen flow rate due to COVID-19  Is within 10 days of symptom onset  Has at least one of the high risk factor(s) for progression to severe COVID-19 and/or hospitalization as defined in EUA.  Specific high risk criteria : >/= 80 yo, HTN, DMT2, immunosuppressive disease (gastric cancer)   I have spoken and communicated the following to the patient or parent/caregiver:  1. FDA has authorized the emergency use of bamlanivimab/etesevimab and casirivimab\imdevimab for the treatment of mild to moderate COVID-19 in adults and pediatric patients with positive results of direct SARS-CoV-2 viral testing who are 67 years of age and older weighing at least 40 kg, and who are at high risk for progressing to severe COVID-19 and/or hospitalization.  2. The significant known and potential risks and benefits of bamlanivimab/etesevimab and casirivimab\imdevimab, and the extent to which such potential risks and benefits are unknown.  3. Information on available alternative treatments and the risks and benefits of those alternatives, including clinical trials.  4. Patients treated with bamlanivimab/etesevimab and casirivimab\imdevimab should continue to self-isolate and use infection control measures (e.g., wear mask, isolate, social distance, avoid sharing personal items, clean and disinfect "high touch" surfaces, and frequent handwashing) according  to CDC guidelines.   5. The patient or parent/caregiver has the option to accept or refuse bamlanivimab/etesevimab or casirivimab\imdevimab .  After reviewing this information with the patient, The patient agreed to proceed with receiving the bamlanimivab/etesevimab infusion and will be provided a copy of the Fact sheet prior to receiving the infusion  Sx onset 4/15 ( the day after his second vaccination). Set up for infusion on 4/25 @ 10:30am.   Angelena Form 07/26/2019 4:33 PM

## 2019-07-27 MED ORDER — SODIUM CHLORIDE 0.9 % IV SOLN
Freq: Once | INTRAVENOUS | Status: AC
  Filled 2019-07-27: qty 700

## 2019-07-28 ENCOUNTER — Ambulatory Visit (HOSPITAL_COMMUNITY)
Admission: RE | Admit: 2019-07-28 | Discharge: 2019-07-28 | Disposition: A | Discharge status: Home / Self Care | Payer: Medicare Other | Source: Ambulatory Visit | Attending: Pulmonary Disease | Admitting: Pulmonary Disease

## 2019-07-28 DIAGNOSIS — C162 Malignant neoplasm of body of stomach: Secondary | ICD-10-CM | POA: Insufficient documentation

## 2019-07-28 DIAGNOSIS — Z23 Encounter for immunization: Secondary | ICD-10-CM | POA: Insufficient documentation

## 2019-07-28 DIAGNOSIS — U071 COVID-19: Secondary | ICD-10-CM | POA: Diagnosis not present

## 2019-07-28 DIAGNOSIS — E1159 Type 2 diabetes mellitus with other circulatory complications: Secondary | ICD-10-CM

## 2019-07-28 DIAGNOSIS — I152 Hypertension secondary to endocrine disorders: Secondary | ICD-10-CM

## 2019-07-28 DIAGNOSIS — I10 Essential (primary) hypertension: Secondary | ICD-10-CM | POA: Diagnosis not present

## 2019-07-28 DIAGNOSIS — E119 Type 2 diabetes mellitus without complications: Secondary | ICD-10-CM | POA: Diagnosis not present

## 2019-07-28 MED ORDER — SODIUM CHLORIDE 0.9 % IV SOLN: INTRAVENOUS | Status: DC | PRN

## 2019-07-28 MED ORDER — EPINEPHRINE 0.3 MG/0.3ML IJ SOAJ: 0.3000 mg | Freq: Once | INTRAMUSCULAR | Status: DC | PRN

## 2019-07-28 MED ORDER — METHYLPREDNISOLONE SODIUM SUCC 125 MG IJ SOLR: 125.0000 mg | Freq: Once | INTRAMUSCULAR | Status: DC | PRN

## 2019-07-28 MED ORDER — ALBUTEROL SULFATE HFA 108 (90 BASE) MCG/ACT IN AERS: 2.0000 | INHALATION_SPRAY | Freq: Once | RESPIRATORY_TRACT | Status: DC | PRN

## 2019-07-28 MED ORDER — FAMOTIDINE IN NACL 20-0.9 MG/50ML-% IV SOLN: 20.0000 mg | Freq: Once | INTRAVENOUS | Status: DC | PRN

## 2019-07-28 MED ORDER — DIPHENHYDRAMINE HCL 50 MG/ML IJ SOLN: 50.0000 mg | Freq: Once | INTRAMUSCULAR | Status: DC | PRN

## 2019-07-28 NOTE — Discharge Instructions (Signed)

## 2019-08-03 DIAGNOSIS — C169 Malignant neoplasm of stomach, unspecified: Secondary | ICD-10-CM | POA: Diagnosis not present

## 2019-08-03 DIAGNOSIS — E440 Moderate protein-calorie malnutrition: Secondary | ICD-10-CM | POA: Diagnosis not present

## 2019-08-05 ENCOUNTER — Other Ambulatory Visit: Payer: Medicare PPO

## 2019-08-05 ENCOUNTER — Ambulatory Visit: Payer: Medicare PPO | Admitting: Hematology & Oncology

## 2019-08-12 ENCOUNTER — Inpatient Hospital Stay: Payer: Medicare PPO

## 2019-08-12 ENCOUNTER — Inpatient Hospital Stay: Payer: Medicare PPO | Admitting: Hematology & Oncology

## 2019-08-15 ENCOUNTER — Telehealth: Payer: Self-pay | Admitting: Hematology & Oncology

## 2019-08-15 NOTE — Telephone Encounter (Signed)
Called and spoke with patient's wife about moving appointment from 5/18 to 5/21 per Dr Marin Olp. He was ok with both and time

## 2019-08-20 ENCOUNTER — Inpatient Hospital Stay: Payer: Medicare PPO

## 2019-08-20 ENCOUNTER — Inpatient Hospital Stay: Payer: Medicare PPO | Admitting: Hematology & Oncology

## 2019-08-21 DIAGNOSIS — H35371 Puckering of macula, right eye: Secondary | ICD-10-CM | POA: Diagnosis not present

## 2019-08-21 DIAGNOSIS — H18413 Arcus senilis, bilateral: Secondary | ICD-10-CM | POA: Diagnosis not present

## 2019-08-21 DIAGNOSIS — Z961 Presence of intraocular lens: Secondary | ICD-10-CM | POA: Diagnosis not present

## 2019-08-21 DIAGNOSIS — H04123 Dry eye syndrome of bilateral lacrimal glands: Secondary | ICD-10-CM | POA: Diagnosis not present

## 2019-08-21 DIAGNOSIS — H35033 Hypertensive retinopathy, bilateral: Secondary | ICD-10-CM | POA: Diagnosis not present

## 2019-08-21 DIAGNOSIS — E119 Type 2 diabetes mellitus without complications: Secondary | ICD-10-CM | POA: Diagnosis not present

## 2019-08-21 DIAGNOSIS — H11153 Pinguecula, bilateral: Secondary | ICD-10-CM | POA: Diagnosis not present

## 2019-08-21 DIAGNOSIS — H353131 Nonexudative age-related macular degeneration, bilateral, early dry stage: Secondary | ICD-10-CM | POA: Diagnosis not present

## 2019-08-21 DIAGNOSIS — H401231 Low-tension glaucoma, bilateral, mild stage: Secondary | ICD-10-CM | POA: Diagnosis not present

## 2019-08-23 ENCOUNTER — Telehealth: Payer: Self-pay | Admitting: Hematology & Oncology

## 2019-08-23 ENCOUNTER — Inpatient Hospital Stay (HOSPITAL_BASED_OUTPATIENT_CLINIC_OR_DEPARTMENT_OTHER): Payer: Medicare PPO | Admitting: Hematology & Oncology

## 2019-08-23 ENCOUNTER — Inpatient Hospital Stay: Payer: Medicare PPO | Attending: Hematology & Oncology

## 2019-08-23 ENCOUNTER — Inpatient Hospital Stay: Payer: Medicare PPO

## 2019-08-23 ENCOUNTER — Other Ambulatory Visit: Payer: Self-pay

## 2019-08-23 VITALS — BP 125/77 | HR 63 | Temp 97.8°F | Resp 18 | Wt 171.0 lb

## 2019-08-23 DIAGNOSIS — C162 Malignant neoplasm of body of stomach: Secondary | ICD-10-CM

## 2019-08-23 DIAGNOSIS — E440 Moderate protein-calorie malnutrition: Secondary | ICD-10-CM

## 2019-08-23 DIAGNOSIS — Z903 Acquired absence of stomach [part of]: Secondary | ICD-10-CM | POA: Diagnosis not present

## 2019-08-23 DIAGNOSIS — D509 Iron deficiency anemia, unspecified: Secondary | ICD-10-CM

## 2019-08-23 DIAGNOSIS — K921 Melena: Secondary | ICD-10-CM

## 2019-08-23 DIAGNOSIS — C169 Malignant neoplasm of stomach, unspecified: Secondary | ICD-10-CM | POA: Diagnosis not present

## 2019-08-23 LAB — CBC WITH DIFFERENTIAL (CANCER CENTER ONLY)
Abs Immature Granulocytes: 0 10*3/uL (ref 0.00–0.07)
Basophils Absolute: 0 10*3/uL (ref 0.0–0.1)
Basophils Relative: 0 %
Eosinophils Absolute: 0 10*3/uL (ref 0.0–0.5)
Eosinophils Relative: 1 %
HCT: 35.2 % — ABNORMAL LOW (ref 39.0–52.0)
Hemoglobin: 11.3 g/dL — ABNORMAL LOW (ref 13.0–17.0)
Immature Granulocytes: 0 %
Lymphocytes Relative: 45 %
Lymphs Abs: 1.3 10*3/uL (ref 0.7–4.0)
MCH: 28.2 pg (ref 26.0–34.0)
MCHC: 32.1 g/dL (ref 30.0–36.0)
MCV: 87.8 fL (ref 80.0–100.0)
Monocytes Absolute: 0.3 10*3/uL (ref 0.1–1.0)
Monocytes Relative: 12 %
Neutro Abs: 1.2 10*3/uL — ABNORMAL LOW (ref 1.7–7.7)
Neutrophils Relative %: 42 %
Platelet Count: 197 10*3/uL (ref 150–400)
RBC: 4.01 MIL/uL — ABNORMAL LOW (ref 4.22–5.81)
RDW: 13.9 % (ref 11.5–15.5)
WBC Count: 2.8 10*3/uL — ABNORMAL LOW (ref 4.0–10.5)
nRBC: 0 % (ref 0.0–0.2)

## 2019-08-23 LAB — CMP (CANCER CENTER ONLY)
ALT: 13 U/L (ref 0–44)
AST: 20 U/L (ref 15–41)
Albumin: 3.7 g/dL (ref 3.5–5.0)
Alkaline Phosphatase: 47 U/L (ref 38–126)
Anion gap: 5 (ref 5–15)
BUN: 14 mg/dL (ref 8–23)
CO2: 29 mmol/L (ref 22–32)
Calcium: 9.4 mg/dL (ref 8.9–10.3)
Chloride: 109 mmol/L (ref 98–111)
Creatinine: 0.94 mg/dL (ref 0.61–1.24)
GFR, Est AFR Am: >60 mL/min (ref >60)
GFR, Estimated: >60 mL/min (ref >60)
Glucose, Bld: 119 mg/dL — ABNORMAL HIGH (ref 70–99)
Potassium: 3.8 mmol/L (ref 3.5–5.1)
Sodium: 143 mmol/L (ref 135–145)
Total Bilirubin: 0.3 mg/dL (ref 0.3–1.2)
Total Protein: 6.7 g/dL (ref 6.5–8.1)

## 2019-08-23 MED ORDER — HEPARIN SOD (PORK) LOCK FLUSH 100 UNIT/ML IV SOLN
500.0000 [IU] | Freq: Once | INTRAVENOUS | Status: AC | PRN
  Administered 2019-08-23: 500 [IU]
  Filled 2019-08-23: qty 5

## 2019-08-23 MED ORDER — SODIUM CHLORIDE 0.9% FLUSH
10.0000 mL | Freq: Once | INTRAVENOUS | Status: AC | PRN
  Administered 2019-08-23: 10 mL
  Filled 2019-08-23: qty 10

## 2019-08-23 NOTE — Progress Notes (Signed)
Hematology and Oncology Follow Up Visit  Jonathan Lewis [id number] 1940/11/18 79 y.o. 08/23/2019   Principle Diagnosis:  Stage IIIA (T3N2M0) adenocarcinoma of the stomach --  S/p distal gastrectomy on 03/19/2019  -- HER2-/PD-L1+  Current Therapy:        Adjuvant FOLFOX - started on 05/13/2019, s/p cycle 4/4   Interim History:  [name] is here today for follow-up.  It has been 6-week since we last saw him.  I am not sure exactly what happened with his last appointment.  I think he may have just not remember to show up.  He is feeling well.  He has had no specific complaints.  He is eating okay.  He is having no nausea or vomiting.  He has had no bleeding.  He has had no change in bowel or bladder habits.  There is been no tingling in his hands or feet.  I think we can try to avoid radiation with him.  I think a recent randomized trial in adjuvant stomach cancer did not show any benefit to radiation therapy versus chemotherapy.  His 61nd anniversary is coming up [employer] Day weekend.  His wife's birthday is also coming up [place] Day weekend.  Overall, his performance status is ECOG 1.  Medications:  Allergies as of 08/23/2019   No Known Allergies     Medication List       Accurate as of Aug 23, 2019 12:28 PM. If you have any questions, ask your nurse or doctor.        acetaminophen 325 MG tablet Commonly known as: Tylenol Take 2 tablets (650 mg total) by mouth every 6 (six) hours as needed for mild pain.   dexamethasone 4 MG tablet Commonly known as: DECADRON Take 2 tablets (8 mg total) by mouth daily. Start day after chemotherapy for two days total.   docusate sodium 100 MG capsule Commonly known as: COLACE Take 1 capsule (100 mg total) by mouth daily.   famotidine 20 MG tablet Commonly known as: PEPCID Take 1 tablet (20 mg total) by mouth daily.   gabapentin 100 MG capsule Commonly known as: NEURONTIN Take 1 capsule (100 mg total) by mouth every 12  (twelve) hours.   latanoprost 0.005 % ophthalmic solution Commonly known as: XALATAN Place 1 drop into both eyes at bedtime.   lidocaine-prilocaine cream Commonly known as: EMLA Apply 1 application topically as needed. Place on the port one hour before appointment.   LORazepam 1 MG tablet Commonly known as: ATIVAN Take 1 tablet (1 mg total) by mouth every 6 (six) hours as needed for anxiety.   ondansetron 8 MG tablet Commonly known as: ZOFRAN Take one tablet by mouth every 12 hours as needed for nausea/vomiting. Start 3 days after chemotherapy.   Systane Ultra 0.4-0.3 % Soln Generic drug: Polyethyl Glycol-Propyl Glycol Apply 1 drop to eye 3 (three) times daily as needed (dry eyes).       Allergies: No Known Allergies  Past Medical History, Surgical history, Social history, and Family History were reviewed and updated.  Review of Systems: Review of Systems  Constitutional: Negative.   HENT: Negative.   Eyes: Negative.   Respiratory: Negative.   Cardiovascular: Negative.   Gastrointestinal: Negative.   Genitourinary: Negative.   Musculoskeletal: Negative.   Skin: Negative.   Neurological: Negative.   Endo/Heme/Allergies: Negative.   Psychiatric/Behavioral: Negative.      Physical Exam:  weight is 171 lb (77.6 kg). His temporal temperature is 97.8 F (36.6 C). His  blood pressure is 125/77 and his pulse is 63. His respiration is 18 and oxygen saturation is 100%.   Wt Readings from Last 3 Encounters:  08/23/19 171 lb (77.6 kg)  07/15/19 171 lb (77.6 kg)  06/25/19 170 lb 12.8 oz (77.5 kg)    Physical Exam Vitals reviewed.  HENT:     Head: Normocephalic and atraumatic.  Eyes:     Pupils: Pupils are equal, round, and reactive to light.  Cardiovascular:     Rate and Rhythm: Normal rate and regular rhythm.     Heart sounds: Normal heart sounds.  Pulmonary:     Effort: Pulmonary effort is normal.     Breath sounds: Normal breath sounds.  Abdominal:      General: Bowel sounds are normal.     Palpations: Abdomen is soft.     Comments: Abdominal exam shows a soft abdomen.  He has good bowel sounds.  He has well-healed laparotomy scars.  There is no fluid wave.  There is no guarding or rebound tenderness.  There is no palpable liver or spleen tip.  Musculoskeletal:        General: No tenderness or deformity. Normal range of motion.     Cervical back: Normal range of motion.  Lymphadenopathy:     Cervical: No cervical adenopathy.  Skin:    General: Skin is warm and dry.     Findings: No erythema or rash.  Neurological:     Mental Status: He is alert and oriented to person, place, and time.  Psychiatric:        Behavior: Behavior normal.        Thought Content: Thought content normal.        Judgment: Judgment normal.      Lab Results  Component Value Date   WBC 2.8 (L) 08/23/2019   HGB 11.3 (L) 08/23/2019   HCT 35.2 (L) 08/23/2019   MCV 87.8 08/23/2019   PLT 197 08/23/2019   Lab Results  Component Value Date   FERRITIN 77 06/25/2019   IRON 69 06/25/2019   TIBC 281 06/25/2019   UIBC 212 06/25/2019   IRONPCTSAT 25 06/25/2019   Lab Results  Component Value Date   RBC 4.01 (L) 08/23/2019   No results found for: KPAFRELGTCHN, LAMBDASER, KAPLAMBRATIO No results found for: IGGSERUM, IGA, IGMSERUM No results found for: Odetta Pink, SPEI   Chemistry      Component Value Date/Time   NA 143 08/23/2019 1158   K 3.8 08/23/2019 1158   CL 109 08/23/2019 1158   CO2 29 08/23/2019 1158   BUN 14 08/23/2019 1158   CREATININE 0.94 08/23/2019 1158   CREATININE 1.06 07/16/2012 1038      Component Value Date/Time   CALCIUM 9.4 08/23/2019 1158   ALKPHOS 47 08/23/2019 1158   AST 20 08/23/2019 1158   ALT 13 08/23/2019 1158   BILITOT 0.3 08/23/2019 1158       Impression and Plan: [name] is a very pleasant 79 yo African American gentleman with stage IIIA (T3N2M0)  adenocarcinoma of the stomach, HER2-/PD-L1+. He had a distal gastrectomy on 03/19/2019. He is currently receiving FOLFOX without the bolus and tolerating nicely. His counts are holding steady.   Again, I think we can probably hold off on the radiation therapy along with Xeloda.  I think the clinical trial which was a phase 3 trial seem to show that chemotherapy by itself is as effective.  We will try to  get him restarted on treatment.  We will try for 4 more cycles of treatment.  I would like to get a CT scan on him.  I would like to get this next week.  I will plan to see him back when we start his fifth cycle of treatment.  Volanda Napoleon, MD 5/21/202112:28 PM

## 2019-08-23 NOTE — Telephone Encounter (Signed)
Appointments scheduled calendar printed per 5/21 los 

## 2019-08-23 NOTE — Progress Notes (Signed)
Pt. States that he is "not taking any medication right now , except for the stool softner and the one that he takes before bed". Does not know which one it is.

## 2019-08-26 LAB — IRON AND TIBC
Iron: 78 ug/dL (ref 42–163)
Saturation Ratios: 25 % (ref 20–55)
TIBC: 308 ug/dL (ref 202–409)
UIBC: 230 ug/dL (ref 117–376)

## 2019-08-26 LAB — FERRITIN: Ferritin: 60 ng/mL (ref 24–336)

## 2019-08-27 NOTE — Progress Notes (Signed)
Pharmacist Chemotherapy Monitoring - Follow Up Assessment    I verify that I have reviewed each item in the below checklist:  . Regimen for the patient is scheduled for the appropriate day and plan matches scheduled date. Marland Kitchen Appropriate non-routine labs are ordered dependent on drug ordered. . If applicable, additional medications reviewed and ordered per protocol based on lifetime cumulative doses and/or treatment regimen.   Plan for follow-up and/or issues identified: Yes . I-vent associated with next due treatment: Yes . MD and/or nursing notified: No  Jonathan Lewis, Jonathan Lewis 08/27/2019 8:12 AM

## 2019-08-28 ENCOUNTER — Ambulatory Visit (HOSPITAL_BASED_OUTPATIENT_CLINIC_OR_DEPARTMENT_OTHER)
Admission: RE | Admit: 2019-08-28 | Discharge: 2019-08-28 | Disposition: A | Discharge status: Home / Self Care | Payer: Medicare PPO | Source: Ambulatory Visit | Attending: Hematology & Oncology | Admitting: Hematology & Oncology

## 2019-08-28 ENCOUNTER — Other Ambulatory Visit: Payer: Self-pay

## 2019-08-28 DIAGNOSIS — C162 Malignant neoplasm of body of stomach: Secondary | ICD-10-CM | POA: Diagnosis not present

## 2019-08-28 DIAGNOSIS — R590 Localized enlarged lymph nodes: Secondary | ICD-10-CM | POA: Diagnosis not present

## 2019-08-28 DIAGNOSIS — R911 Solitary pulmonary nodule: Secondary | ICD-10-CM | POA: Diagnosis not present

## 2019-08-28 DIAGNOSIS — Z8509 Personal history of malignant neoplasm of other digestive organs: Secondary | ICD-10-CM | POA: Diagnosis not present

## 2019-08-28 MED ORDER — IOHEXOL 300 MG/ML  SOLN
100.0000 mL | Freq: Once | INTRAMUSCULAR | Status: AC | PRN
  Administered 2019-08-28: 100 mL via INTRAVENOUS

## 2019-09-03 DIAGNOSIS — E440 Moderate protein-calorie malnutrition: Secondary | ICD-10-CM | POA: Diagnosis not present

## 2019-09-03 DIAGNOSIS — C169 Malignant neoplasm of stomach, unspecified: Secondary | ICD-10-CM | POA: Diagnosis not present

## 2019-09-04 ENCOUNTER — Inpatient Hospital Stay: Payer: Medicare PPO

## 2019-09-04 ENCOUNTER — Inpatient Hospital Stay: Payer: Medicare PPO | Attending: Hematology & Oncology

## 2019-09-04 ENCOUNTER — Inpatient Hospital Stay (HOSPITAL_BASED_OUTPATIENT_CLINIC_OR_DEPARTMENT_OTHER): Payer: Medicare PPO | Admitting: Family

## 2019-09-04 ENCOUNTER — Encounter: Payer: Self-pay | Admitting: Family

## 2019-09-04 ENCOUNTER — Telehealth: Payer: Self-pay | Admitting: Hematology & Oncology

## 2019-09-04 ENCOUNTER — Other Ambulatory Visit: Payer: Self-pay

## 2019-09-04 VITALS — BP 133/84 | HR 55 | Temp 98.4°F | Resp 17 | Wt 175.0 lb

## 2019-09-04 DIAGNOSIS — D509 Iron deficiency anemia, unspecified: Secondary | ICD-10-CM

## 2019-09-04 DIAGNOSIS — Z7952 Long term (current) use of systemic steroids: Secondary | ICD-10-CM | POA: Diagnosis not present

## 2019-09-04 DIAGNOSIS — C169 Malignant neoplasm of stomach, unspecified: Secondary | ICD-10-CM | POA: Diagnosis not present

## 2019-09-04 DIAGNOSIS — C162 Malignant neoplasm of body of stomach: Secondary | ICD-10-CM

## 2019-09-04 DIAGNOSIS — Z5111 Encounter for antineoplastic chemotherapy: Secondary | ICD-10-CM | POA: Insufficient documentation

## 2019-09-04 DIAGNOSIS — Z79899 Other long term (current) drug therapy: Secondary | ICD-10-CM | POA: Diagnosis not present

## 2019-09-04 LAB — CMP (CANCER CENTER ONLY)
ALT: 12 U/L (ref 0–44)
AST: 19 U/L (ref 15–41)
Albumin: 3.7 g/dL (ref 3.5–5.0)
Alkaline Phosphatase: 45 U/L (ref 38–126)
Anion gap: 4 — ABNORMAL LOW (ref 5–15)
BUN: 17 mg/dL (ref 8–23)
CO2: 29 mmol/L (ref 22–32)
Calcium: 9.2 mg/dL (ref 8.9–10.3)
Chloride: 106 mmol/L (ref 98–111)
Creatinine: 0.87 mg/dL (ref 0.61–1.24)
GFR, Est AFR Am: >60 mL/min (ref >60)
GFR, Estimated: >60 mL/min (ref >60)
Glucose, Bld: 99 mg/dL (ref 70–99)
Potassium: 3.9 mmol/L (ref 3.5–5.1)
Sodium: 139 mmol/L (ref 135–145)
Total Bilirubin: 0.3 mg/dL (ref 0.3–1.2)
Total Protein: 6.5 g/dL (ref 6.5–8.1)

## 2019-09-04 LAB — CBC WITH DIFFERENTIAL (CANCER CENTER ONLY)
Abs Immature Granulocytes: 0 10*3/uL (ref 0.00–0.07)
Basophils Absolute: 0 10*3/uL (ref 0.0–0.1)
Basophils Relative: 1 %
Eosinophils Absolute: 0.1 10*3/uL (ref 0.0–0.5)
Eosinophils Relative: 2 %
HCT: 34.2 % — ABNORMAL LOW (ref 39.0–52.0)
Hemoglobin: 11 g/dL — ABNORMAL LOW (ref 13.0–17.0)
Immature Granulocytes: 0 %
Lymphocytes Relative: 53 %
Lymphs Abs: 1.7 10*3/uL (ref 0.7–4.0)
MCH: 28.5 pg (ref 26.0–34.0)
MCHC: 32.2 g/dL (ref 30.0–36.0)
MCV: 88.6 fL (ref 80.0–100.0)
Monocytes Absolute: 0.4 10*3/uL (ref 0.1–1.0)
Monocytes Relative: 13 %
Neutro Abs: 1 10*3/uL — ABNORMAL LOW (ref 1.7–7.7)
Neutrophils Relative %: 31 %
Platelet Count: 181 10*3/uL (ref 150–400)
RBC: 3.86 MIL/uL — ABNORMAL LOW (ref 4.22–5.81)
RDW: 14.1 % (ref 11.5–15.5)
WBC Count: 3.2 10*3/uL — ABNORMAL LOW (ref 4.0–10.5)
nRBC: 0 % (ref 0.0–0.2)

## 2019-09-04 LAB — CEA (IN HOUSE-CHCC): CEA (CHCC-In House): 1.34 ng/mL (ref 0.00–5.00)

## 2019-09-04 MED ORDER — OXALIPLATIN CHEMO INJECTION 100 MG/20ML
68.0000 mg/m2 | Freq: Once | INTRAVENOUS | Status: AC
  Administered 2019-09-04: 120 mg via INTRAVENOUS
  Filled 2019-09-04: qty 24

## 2019-09-04 MED ORDER — SODIUM CHLORIDE 0.9 % IV SOLN
10.0000 mg | Freq: Once | INTRAVENOUS | Status: AC
  Administered 2019-09-04: 10 mg via INTRAVENOUS
  Filled 2019-09-04: qty 10

## 2019-09-04 MED ORDER — PALONOSETRON HCL INJECTION 0.25 MG/5ML
INTRAVENOUS | Status: AC
  Filled 2019-09-04: qty 5

## 2019-09-04 MED ORDER — LEUCOVORIN CALCIUM INJECTION 350 MG
400.0000 mg/m2 | Freq: Once | INTRAVENOUS | Status: AC
  Administered 2019-09-04: 712 mg via INTRAVENOUS
  Filled 2019-09-04: qty 35.6

## 2019-09-04 MED ORDER — PALONOSETRON HCL INJECTION 0.25 MG/5ML
0.2500 mg | Freq: Once | INTRAVENOUS | Status: AC
  Administered 2019-09-04: 0.25 mg via INTRAVENOUS

## 2019-09-04 MED ORDER — DEXTROSE 5 % IV SOLN
Freq: Once | INTRAVENOUS | Status: AC
  Filled 2019-09-04: qty 250

## 2019-09-04 MED ORDER — SODIUM CHLORIDE 0.9 % IV SOLN
1920.0000 mg/m2 | INTRAVENOUS | Status: DC
  Administered 2019-09-04: 3400 mg via INTRAVENOUS
  Filled 2019-09-04: qty 68

## 2019-09-04 NOTE — Telephone Encounter (Signed)
Unable to schedule at this time.  Theone Murdoch, RPH is checking on dates /treatment plan vs 6/2 LOS

## 2019-09-04 NOTE — Progress Notes (Signed)
Hematology and Oncology Follow Up Visit  Jonathan Lewis [id number] 1941-03-25 79 y.o. 09/04/2019   Principle Diagnosis:  Stage IIIA (T3N2M0) adenocarcinoma of the stomach -- S/p distal gastrectomy on 03/19/2019 -- HER2-/PD-L1+  Current Therapy: Adjuvant FOLFOX -started on 05/13/2019, s/p cycle 4   Interim History:  Jonathan Lewis is here today for follow-up and treatment. Overall he is doing well but has occasional itching on his had and neck at bedtime. This comes and goes on it's own. He will try using a body lotion for dry skin and see if this helps.  His CT scans last week showed mediastinal lymph nodes, no evidence of local recurrence of metastatic disease of the abdomen or pelvis.  No episodes of bleeding. No bruising or petechiae.  No fever, chills, n/v, cough, dizziness, SOB, chest pain, palpitations, abdominal pain or changes in bowel or bladder habits.  He will try gasex for any bloating/flatus.  He plan to have dental work done including extractions and possibly partial after he has completed 8 cycles of treatment.  No swelling or tenderness in his extremities.  He tingling in his fingers that comes and goes.  No falls or syncopal episodes to report.  He has maintained a good appetite and is staying well hydrated. His weight is stable.   ECOG Performance Status: 1 - Symptomatic but completely ambulatory+   Medications:  Allergies as of 09/04/2019   No Known Allergies     Medication List       Accurate as of September 04, 2019  9:09 AM. If you have any questions, ask your nurse or doctor.        acetaminophen 325 MG tablet Commonly known as: Tylenol Take 2 tablets (650 mg total) by mouth every 6 (six) hours as needed for mild pain.   dexamethasone 4 MG tablet Commonly known as: DECADRON Take 2 tablets (8 mg total) by mouth daily. Start day after chemotherapy for two days total.   docusate sodium 100 MG capsule Commonly known as: COLACE Take 1 capsule (100 mg  total) by mouth daily.   famotidine 20 MG tablet Commonly known as: PEPCID Take 1 tablet (20 mg total) by mouth daily.   gabapentin 100 MG capsule Commonly known as: NEURONTIN Take 1 capsule (100 mg total) by mouth every 12 (twelve) hours.   latanoprost 0.005 % ophthalmic solution Commonly known as: XALATAN Place 1 drop into both eyes at bedtime.   lidocaine-prilocaine cream Commonly known as: EMLA Apply 1 application topically as needed. Place on the port one hour before appointment.   lisinopril 10 MG tablet Commonly known as: ZESTRIL   LORazepam 1 MG tablet Commonly known as: ATIVAN Take 1 tablet (1 mg total) by mouth every 6 (six) hours as needed for anxiety.   ondansetron 8 MG tablet Commonly known as: ZOFRAN Take one tablet by mouth every 12 hours as needed for nausea/vomiting. Start 3 days after chemotherapy.   pantoprazole 40 MG tablet Commonly known as: PROTONIX   Systane Ultra 0.4-0.3 % Soln Generic drug: Polyethyl Glycol-Propyl Glycol Apply 1 drop to eye 3 (three) times daily as needed (dry eyes).   terazosin 2 MG capsule Commonly known as: HYTRIN       Allergies: No Known Allergies  Past Medical History, Surgical history, Social history, and Family History were reviewed and updated.  Review of Systems: All other 10 point review of systems is negative.   Physical Exam:  weight is 175 lb (79.4 kg). His temperature is 98.4 F (  36.9 C). His blood pressure is 133/84 and his pulse is 55 (abnormal). His respiration is 17 and oxygen saturation is 100%.   Wt Readings from Last 3 Encounters:  09/04/19 175 lb (79.4 kg)  08/23/19 171 lb (77.6 kg)  07/15/19 171 lb (77.6 kg)    Ocular: Sclerae unicteric, pupils equal, round and reactive to light Ear-nose-throat: Oropharynx clear, dentition fair Lymphatic: No cervical or supraclavicular adenopathy Lungs no rales or rhonchi, good excursion bilaterally Heart regular rate and rhythm, no murmur appreciated Abd  soft, nontender, positive bowel sounds, no liver or spleen tip palpated on exam, no fluid wave  MSK no focal spinal tenderness, no joint edema Neuro: non-focal, well-oriented, appropriate affect Breasts: Deferred   Lab Results  Component Value Date   WBC 3.2 (L) 09/04/2019   HGB 11.0 (L) 09/04/2019   HCT 34.2 (L) 09/04/2019   MCV 88.6 09/04/2019   PLT 181 09/04/2019   Lab Results  Component Value Date   FERRITIN 60 08/23/2019   IRON 78 08/23/2019   TIBC 308 08/23/2019   UIBC 230 08/23/2019   IRONPCTSAT 25 08/23/2019   Lab Results  Component Value Date   RBC 3.86 (L) 09/04/2019   No results found for: KPAFRELGTCHN, LAMBDASER, KAPLAMBRATIO No results found for: IGGSERUM, IGA, IGMSERUM No results found for: Kathrynn Ducking, MSPIKE, SPEI   Chemistry      Component Value Date/Time   NA 139 09/04/2019 0830   K 3.9 09/04/2019 0830   CL 106 09/04/2019 0830   CO2 29 09/04/2019 0830   BUN 17 09/04/2019 0830   CREATININE 0.87 09/04/2019 0830   CREATININE 1.06 07/16/2012 1038      Component Value Date/Time   CALCIUM 9.2 09/04/2019 0830   ALKPHOS 45 09/04/2019 0830   AST 19 09/04/2019 0830   ALT 12 09/04/2019 0830   BILITOT 0.3 09/04/2019 0830       Impression and Plan: [name] is a very pleasant 79 yo African American gentleman with stage IIIA (T3N2M0) adenocarcinoma of the stomach, HER2-/PD-L1+. He had a distal gastrectomy on 03/19/2019 and is currently receiving FOLFOX without the bolus and tolerating nicely.  His counts continue to remain stable. Ok to treat with ANC 1.0 per Dr. Marin Lewis. We will proceed with cycle 5.  CEA is pending.  We will see him again in 2 weeks.  He will contact our office with any questions or concerns. We can certainly see him sooner if needed.   Laverna Peace, NP 6/2/20219:09 AM

## 2019-09-04 NOTE — Progress Notes (Signed)
Ok to treat with [place] today per Judson Roch NP

## 2019-09-05 ENCOUNTER — Other Ambulatory Visit: Payer: Medicare PPO

## 2019-09-06 ENCOUNTER — Other Ambulatory Visit: Payer: Self-pay

## 2019-09-06 ENCOUNTER — Inpatient Hospital Stay: Payer: Medicare PPO

## 2019-09-06 VITALS — HR 65 | Temp 98.7°F | Resp 18

## 2019-09-06 DIAGNOSIS — Z5111 Encounter for antineoplastic chemotherapy: Secondary | ICD-10-CM | POA: Diagnosis not present

## 2019-09-06 DIAGNOSIS — Z79899 Other long term (current) drug therapy: Secondary | ICD-10-CM | POA: Diagnosis not present

## 2019-09-06 DIAGNOSIS — Z7952 Long term (current) use of systemic steroids: Secondary | ICD-10-CM | POA: Diagnosis not present

## 2019-09-06 DIAGNOSIS — C162 Malignant neoplasm of body of stomach: Secondary | ICD-10-CM

## 2019-09-06 DIAGNOSIS — C169 Malignant neoplasm of stomach, unspecified: Secondary | ICD-10-CM | POA: Diagnosis not present

## 2019-09-06 MED ORDER — SODIUM CHLORIDE 0.9% FLUSH
10.0000 mL | INTRAVENOUS | Status: DC | PRN
  Administered 2019-09-06: 10 mL
  Filled 2019-09-06: qty 10

## 2019-09-06 MED ORDER — HEPARIN SOD (PORK) LOCK FLUSH 100 UNIT/ML IV SOLN
500.0000 [IU] | Freq: Once | INTRAVENOUS | Status: AC | PRN
  Administered 2019-09-06: 500 [IU]
  Filled 2019-09-06: qty 5

## 2019-09-25 ENCOUNTER — Inpatient Hospital Stay: Payer: Medicare PPO

## 2019-09-25 ENCOUNTER — Inpatient Hospital Stay (HOSPITAL_BASED_OUTPATIENT_CLINIC_OR_DEPARTMENT_OTHER): Payer: Medicare PPO | Admitting: Hematology & Oncology

## 2019-09-25 ENCOUNTER — Other Ambulatory Visit: Payer: Self-pay

## 2019-09-25 ENCOUNTER — Encounter: Payer: Self-pay | Admitting: Hematology & Oncology

## 2019-09-25 VITALS — BP 119/70 | HR 61 | Temp 97.8°F | Resp 17 | Wt 178.8 lb

## 2019-09-25 DIAGNOSIS — K921 Melena: Secondary | ICD-10-CM

## 2019-09-25 DIAGNOSIS — C162 Malignant neoplasm of body of stomach: Secondary | ICD-10-CM

## 2019-09-25 DIAGNOSIS — Z79899 Other long term (current) drug therapy: Secondary | ICD-10-CM | POA: Diagnosis not present

## 2019-09-25 DIAGNOSIS — Z5111 Encounter for antineoplastic chemotherapy: Secondary | ICD-10-CM | POA: Diagnosis not present

## 2019-09-25 DIAGNOSIS — D509 Iron deficiency anemia, unspecified: Secondary | ICD-10-CM

## 2019-09-25 DIAGNOSIS — Z95828 Presence of other vascular implants and grafts: Secondary | ICD-10-CM

## 2019-09-25 DIAGNOSIS — C169 Malignant neoplasm of stomach, unspecified: Secondary | ICD-10-CM | POA: Diagnosis not present

## 2019-09-25 DIAGNOSIS — E440 Moderate protein-calorie malnutrition: Secondary | ICD-10-CM

## 2019-09-25 DIAGNOSIS — Z7952 Long term (current) use of systemic steroids: Secondary | ICD-10-CM | POA: Diagnosis not present

## 2019-09-25 LAB — CMP (CANCER CENTER ONLY)
ALT: 16 U/L (ref 0–44)
AST: 21 U/L (ref 15–41)
Albumin: 3.7 g/dL (ref 3.5–5.0)
Alkaline Phosphatase: 46 U/L (ref 38–126)
Anion gap: 7 (ref 5–15)
BUN: 18 mg/dL (ref 8–23)
CO2: 26 mmol/L (ref 22–32)
Calcium: 9.2 mg/dL (ref 8.9–10.3)
Chloride: 106 mmol/L (ref 98–111)
Creatinine: 0.76 mg/dL (ref 0.61–1.24)
GFR, Est AFR Am: >60 mL/min (ref >60)
GFR, Estimated: >60 mL/min (ref >60)
Glucose, Bld: 173 mg/dL — ABNORMAL HIGH (ref 70–99)
Potassium: 3.7 mmol/L (ref 3.5–5.1)
Sodium: 139 mmol/L (ref 135–145)
Total Bilirubin: 0.4 mg/dL (ref 0.3–1.2)
Total Protein: 6.4 g/dL — ABNORMAL LOW (ref 6.5–8.1)

## 2019-09-25 LAB — CBC WITH DIFFERENTIAL (CANCER CENTER ONLY)
Abs Immature Granulocytes: 0.01 10*3/uL (ref 0.00–0.07)
Basophils Absolute: 0 10*3/uL (ref 0.0–0.1)
Basophils Relative: 0 %
Eosinophils Absolute: 0 10*3/uL (ref 0.0–0.5)
Eosinophils Relative: 1 %
HCT: 35.5 % — ABNORMAL LOW (ref 39.0–52.0)
Hemoglobin: 11.5 g/dL — ABNORMAL LOW (ref 13.0–17.0)
Immature Granulocytes: 0 %
Lymphocytes Relative: 53 %
Lymphs Abs: 1.2 10*3/uL (ref 0.7–4.0)
MCH: 28.8 pg (ref 26.0–34.0)
MCHC: 32.4 g/dL (ref 30.0–36.0)
MCV: 88.8 fL (ref 80.0–100.0)
Monocytes Absolute: 0.3 10*3/uL (ref 0.1–1.0)
Monocytes Relative: 14 %
Neutro Abs: 0.7 10*3/uL — ABNORMAL LOW (ref 1.7–7.7)
Neutrophils Relative %: 32 %
Platelet Count: 160 10*3/uL (ref 150–400)
RBC: 4 MIL/uL — ABNORMAL LOW (ref 4.22–5.81)
RDW: 13.7 % (ref 11.5–15.5)
WBC Count: 2.3 10*3/uL — ABNORMAL LOW (ref 4.0–10.5)
nRBC: 0 % (ref 0.0–0.2)

## 2019-09-25 MED ORDER — SODIUM CHLORIDE 0.9% FLUSH
10.0000 mL | Freq: Once | INTRAVENOUS | Status: AC | PRN
  Administered 2019-09-25: 10 mL
  Filled 2019-09-25: qty 10

## 2019-09-25 MED ORDER — HEPARIN SOD (PORK) LOCK FLUSH 100 UNIT/ML IV SOLN
500.0000 [IU] | Freq: Once | INTRAVENOUS | Status: AC
  Administered 2019-09-25: 500 [IU] via INTRAVENOUS
  Filled 2019-09-25: qty 5

## 2019-09-25 MED ORDER — SODIUM CHLORIDE 0.9% FLUSH
10.0000 mL | Freq: Once | INTRAVENOUS | Status: AC
  Administered 2019-09-25: 10 mL via INTRAVENOUS
  Filled 2019-09-25: qty 10

## 2019-09-25 NOTE — Addendum Note (Signed)
Addended by: Tyler Aas A on: 09/25/2019 11:42 AM   Modules accepted: Orders

## 2019-09-25 NOTE — Patient Instructions (Signed)

## 2019-09-25 NOTE — Progress Notes (Signed)
Hematology and Oncology Follow Up Visit  Jonathan Lewis [id number] 19-Mar-1941 79 y.o. 09/25/2019   Principle Diagnosis:  Stage IIIA (T3N2M0) adenocarcinoma of the stomach -- S/p distal gastrectomy on 03/19/2019 -- HER2-/PD-L1+  Current Therapy: Adjuvant FOLFOX -started on 05/13/2019, s/p cycle 4   Interim History:  Jonathan Lewis is here today for follow-up and treatment. Overall he is doing well but has occasional itching on his had and neck at bedtime. This comes and goes on it's own. He will try using a body lotion for dry skin and see if this helps.  His CT scans last week showed mediastinal lymph nodes, no evidence of local recurrence of metastatic disease of the abdomen or pelvis.  No episodes of bleeding. No bruising or petechiae.  No fever, chills, n/v, cough, dizziness, SOB, chest pain, palpitations, abdominal pain or changes in bowel or bladder habits.  He will try gasex for any bloating/flatus.  He plan to have dental work done including extractions and possibly partial after he has completed 8 cycles of treatment.  No swelling or tenderness in his extremities.  He tingling in his fingers that comes and goes.  No falls or syncopal episodes to report.  He has maintained a good appetite and is staying well hydrated. His weight is stable.   ECOG Performance Status: 1 - Symptomatic but completely ambulatory+   Medications:  Allergies as of 09/25/2019   No Known Allergies     Medication List       Accurate as of September 25, 2019 11:20 AM. If you have any questions, ask your nurse or doctor.        acetaminophen 325 MG tablet Commonly known as: Tylenol Take 2 tablets (650 mg total) by mouth every 6 (six) hours as needed for mild pain.   dexamethasone 4 MG tablet Commonly known as: DECADRON Take 2 tablets (8 mg total) by mouth daily. Start day after chemotherapy for two days total.   docusate sodium 100 MG capsule Commonly known as: COLACE Take 1 capsule (100  mg total) by mouth daily.   famotidine 20 MG tablet Commonly known as: PEPCID Take 1 tablet (20 mg total) by mouth daily.   gabapentin 100 MG capsule Commonly known as: NEURONTIN Take 1 capsule (100 mg total) by mouth every 12 (twelve) hours.   latanoprost 0.005 % ophthalmic solution Commonly known as: XALATAN Place 1 drop into both eyes at bedtime.   lidocaine-prilocaine cream Commonly known as: EMLA Apply 1 application topically as needed. Place on the port one hour before appointment.   lisinopril 10 MG tablet Commonly known as: ZESTRIL   LORazepam 1 MG tablet Commonly known as: ATIVAN Take 1 tablet (1 mg total) by mouth every 6 (six) hours as needed for anxiety.   ondansetron 8 MG tablet Commonly known as: ZOFRAN Take one tablet by mouth every 12 hours as needed for nausea/vomiting. Start 3 days after chemotherapy.   pantoprazole 40 MG tablet Commonly known as: PROTONIX   Systane Ultra 0.4-0.3 % Soln Generic drug: Polyethyl Glycol-Propyl Glycol Apply 1 drop to eye 3 (three) times daily as needed (dry eyes).   terazosin 2 MG capsule Commonly known as: HYTRIN       Allergies: No Known Allergies  Past Medical History, Surgical history, Social history, and Family History were reviewed and updated.  Review of Systems: All other 10 point review of systems is negative.   Physical Exam:  weight is 178 lb 12.8 oz (81.1 kg). His temporal temperature is  97.8 F (36.6 C). His blood pressure is 119/70 and his pulse is 61. His respiration is 17 and oxygen saturation is 100%.   Wt Readings from Last 3 Encounters:  09/25/19 178 lb 12.8 oz (81.1 kg)  09/04/19 175 lb (79.4 kg)  08/23/19 171 lb (77.6 kg)    Ocular: Sclerae unicteric, pupils equal, round and reactive to light Ear-nose-throat: Oropharynx clear, dentition fair Lymphatic: No cervical or supraclavicular adenopathy Lungs no rales or rhonchi, good excursion bilaterally Heart regular rate and rhythm, no murmur  appreciated Abd soft, nontender, positive bowel sounds, no liver or spleen tip palpated on exam, no fluid wave  MSK no focal spinal tenderness, no joint edema Neuro: non-focal, well-oriented, appropriate affect Breasts: Deferred   Lab Results  Component Value Date   WBC 2.3 (L) 09/25/2019   HGB 11.5 (L) 09/25/2019   HCT 35.5 (L) 09/25/2019   MCV 88.8 09/25/2019   PLT 160 09/25/2019   Lab Results  Component Value Date   FERRITIN 60 08/23/2019   IRON 78 08/23/2019   TIBC 308 08/23/2019   UIBC 230 08/23/2019   IRONPCTSAT 25 08/23/2019   Lab Results  Component Value Date   RBC 4.00 (L) 09/25/2019   No results found for: KPAFRELGTCHN, LAMBDASER, KAPLAMBRATIO No results found for: IGGSERUM, IGA, IGMSERUM No results found for: Ronnald Ramp, A1GS, A2GS, Tillman Sers, SPEI   Chemistry      Component Value Date/Time   NA 139 09/25/2019 1032   K 3.7 09/25/2019 1032   CL 106 09/25/2019 1032   CO2 26 09/25/2019 1032   BUN 18 09/25/2019 1032   CREATININE 0.76 09/25/2019 1032   CREATININE 1.06 07/16/2012 1038      Component Value Date/Time   CALCIUM 9.2 09/25/2019 1032   ALKPHOS 46 09/25/2019 1032   AST 21 09/25/2019 1032   ALT 16 09/25/2019 1032   BILITOT 0.4 09/25/2019 1032       Impression and Plan: Jonathan Lewis is a very pleasant 79 yo African American gentleman with stage IIIA (T3N2M0) adenocarcinoma of the stomach, HER2-/PD-L1+. He had a distal gastrectomy on 03/19/2019 and is currently receiving FOLFOX without the bolus and tolerating nicely.  His counts continue to remain stable. Ok to treat with ANC 1.0 per Dr. Marin Olp. We will proceed with cycle 5.  CEA is pending.  We will see him again in 2 weeks.  He will contact our office with any questions or concerns. We can certainly see him sooner if needed.   Volanda Napoleon, MD 6/23/202111:20 AM

## 2019-09-26 LAB — FERRITIN: Ferritin: 37 ng/mL (ref 24–336)

## 2019-09-26 LAB — IRON AND TIBC
Iron: 98 ug/dL (ref 42–163)
Saturation Ratios: 30 % (ref 20–55)
TIBC: 328 ug/dL (ref 202–409)
UIBC: 230 ug/dL (ref 117–376)

## 2019-09-27 ENCOUNTER — Inpatient Hospital Stay: Payer: Medicare PPO

## 2019-10-03 DIAGNOSIS — E440 Moderate protein-calorie malnutrition: Secondary | ICD-10-CM | POA: Diagnosis not present

## 2019-10-03 DIAGNOSIS — C169 Malignant neoplasm of stomach, unspecified: Secondary | ICD-10-CM | POA: Diagnosis not present

## 2019-10-08 ENCOUNTER — Ambulatory Visit (HOSPITAL_COMMUNITY)
Admission: RE | Admit: 2019-10-08 | Discharge: 2019-10-08 | Disposition: A | Discharge status: Home / Self Care | Payer: Medicare PPO | Source: Ambulatory Visit | Attending: Hematology & Oncology | Admitting: Hematology & Oncology

## 2019-10-08 ENCOUNTER — Other Ambulatory Visit: Payer: Self-pay

## 2019-10-08 DIAGNOSIS — M47812 Spondylosis without myelopathy or radiculopathy, cervical region: Secondary | ICD-10-CM | POA: Diagnosis not present

## 2019-10-08 DIAGNOSIS — M5021 Other cervical disc displacement,  high cervical region: Secondary | ICD-10-CM | POA: Diagnosis not present

## 2019-10-08 DIAGNOSIS — C162 Malignant neoplasm of body of stomach: Secondary | ICD-10-CM | POA: Diagnosis not present

## 2019-10-08 DIAGNOSIS — M4802 Spinal stenosis, cervical region: Secondary | ICD-10-CM | POA: Diagnosis not present

## 2019-10-08 DIAGNOSIS — G9589 Other specified diseases of spinal cord: Secondary | ICD-10-CM | POA: Diagnosis not present

## 2019-10-08 MED ORDER — GADOBUTROL 1 MMOL/ML IV SOLN
8.0000 mL | Freq: Once | INTRAVENOUS | Status: AC | PRN
  Administered 2019-10-08: 8 mL via INTRAVENOUS

## 2019-10-09 ENCOUNTER — Encounter: Payer: Self-pay | Admitting: Family

## 2019-10-09 ENCOUNTER — Inpatient Hospital Stay: Payer: Medicare PPO | Attending: Hematology & Oncology

## 2019-10-09 ENCOUNTER — Inpatient Hospital Stay: Payer: Medicare PPO

## 2019-10-09 ENCOUNTER — Telehealth: Payer: Self-pay | Admitting: Hematology & Oncology

## 2019-10-09 ENCOUNTER — Inpatient Hospital Stay (HOSPITAL_BASED_OUTPATIENT_CLINIC_OR_DEPARTMENT_OTHER): Payer: Medicare PPO | Admitting: Family

## 2019-10-09 VITALS — BP 126/68 | HR 60 | Temp 98.5°F | Resp 18 | Ht 68.0 in | Wt 181.0 lb

## 2019-10-09 DIAGNOSIS — C162 Malignant neoplasm of body of stomach: Secondary | ICD-10-CM

## 2019-10-09 DIAGNOSIS — R5383 Other fatigue: Secondary | ICD-10-CM | POA: Diagnosis not present

## 2019-10-09 DIAGNOSIS — Z79899 Other long term (current) drug therapy: Secondary | ICD-10-CM | POA: Insufficient documentation

## 2019-10-09 DIAGNOSIS — R202 Paresthesia of skin: Secondary | ICD-10-CM | POA: Insufficient documentation

## 2019-10-09 DIAGNOSIS — M25512 Pain in left shoulder: Secondary | ICD-10-CM | POA: Insufficient documentation

## 2019-10-09 DIAGNOSIS — R200 Anesthesia of skin: Secondary | ICD-10-CM | POA: Diagnosis not present

## 2019-10-09 DIAGNOSIS — Z452 Encounter for adjustment and management of vascular access device: Secondary | ICD-10-CM | POA: Insufficient documentation

## 2019-10-09 DIAGNOSIS — Z5111 Encounter for antineoplastic chemotherapy: Secondary | ICD-10-CM | POA: Insufficient documentation

## 2019-10-09 DIAGNOSIS — Z9049 Acquired absence of other specified parts of digestive tract: Secondary | ICD-10-CM | POA: Insufficient documentation

## 2019-10-09 DIAGNOSIS — C169 Malignant neoplasm of stomach, unspecified: Secondary | ICD-10-CM | POA: Diagnosis not present

## 2019-10-09 DIAGNOSIS — D509 Iron deficiency anemia, unspecified: Secondary | ICD-10-CM | POA: Diagnosis not present

## 2019-10-09 LAB — CBC WITH DIFFERENTIAL (CANCER CENTER ONLY)
Abs Immature Granulocytes: 0 10*3/uL (ref 0.00–0.07)
Basophils Absolute: 0 10*3/uL (ref 0.0–0.1)
Basophils Relative: 1 %
Eosinophils Absolute: 0.1 10*3/uL (ref 0.0–0.5)
Eosinophils Relative: 2 %
HCT: 34.9 % — ABNORMAL LOW (ref 39.0–52.0)
Hemoglobin: 11.4 g/dL — ABNORMAL LOW (ref 13.0–17.0)
Immature Granulocytes: 0 %
Lymphocytes Relative: 45 %
Lymphs Abs: 1.4 10*3/uL (ref 0.7–4.0)
MCH: 28.7 pg (ref 26.0–34.0)
MCHC: 32.7 g/dL (ref 30.0–36.0)
MCV: 87.9 fL (ref 80.0–100.0)
Monocytes Absolute: 0.5 10*3/uL (ref 0.1–1.0)
Monocytes Relative: 14 %
Neutro Abs: 1.2 10*3/uL — ABNORMAL LOW (ref 1.7–7.7)
Neutrophils Relative %: 38 %
Platelet Count: 184 10*3/uL (ref 150–400)
RBC: 3.97 MIL/uL — ABNORMAL LOW (ref 4.22–5.81)
RDW: 13.2 % (ref 11.5–15.5)
WBC Count: 3.2 10*3/uL — ABNORMAL LOW (ref 4.0–10.5)
nRBC: 0 % (ref 0.0–0.2)

## 2019-10-09 LAB — CMP (CANCER CENTER ONLY)
ALT: 12 U/L (ref 0–44)
AST: 17 U/L (ref 15–41)
Albumin: 3.8 g/dL (ref 3.5–5.0)
Alkaline Phosphatase: 45 U/L (ref 38–126)
Anion gap: 4 — ABNORMAL LOW (ref 5–15)
BUN: 18 mg/dL (ref 8–23)
CO2: 29 mmol/L (ref 22–32)
Calcium: 9.3 mg/dL (ref 8.9–10.3)
Chloride: 107 mmol/L (ref 98–111)
Creatinine: 0.9 mg/dL (ref 0.61–1.24)
GFR, Est AFR Am: >60 mL/min (ref >60)
GFR, Estimated: >60 mL/min (ref >60)
Glucose, Bld: 106 mg/dL — ABNORMAL HIGH (ref 70–99)
Potassium: 3.7 mmol/L (ref 3.5–5.1)
Sodium: 140 mmol/L (ref 135–145)
Total Bilirubin: 0.4 mg/dL (ref 0.3–1.2)
Total Protein: 6.4 g/dL — ABNORMAL LOW (ref 6.5–8.1)

## 2019-10-09 LAB — LACTATE DEHYDROGENASE: LDH: 160 U/L (ref 98–192)

## 2019-10-09 LAB — TSH: TSH: 2.792 u[IU]/mL (ref 0.320–4.118)

## 2019-10-09 MED ORDER — DEXTROSE 5 % IV SOLN
Freq: Once | INTRAVENOUS | Status: AC
  Filled 2019-10-09: qty 250

## 2019-10-09 MED ORDER — SODIUM CHLORIDE 0.9 % IV SOLN
1920.0000 mg/m2 | INTRAVENOUS | Status: DC
  Administered 2019-10-09: 3400 mg via INTRAVENOUS
  Filled 2019-10-09: qty 68

## 2019-10-09 MED ORDER — SODIUM CHLORIDE 0.9 % IV SOLN
10.0000 mg | Freq: Once | INTRAVENOUS | Status: AC
  Administered 2019-10-09: 10 mg via INTRAVENOUS
  Filled 2019-10-09: qty 10

## 2019-10-09 MED ORDER — OXALIPLATIN CHEMO INJECTION 100 MG/20ML
68.0000 mg/m2 | Freq: Once | INTRAVENOUS | Status: AC
  Administered 2019-10-09: 120 mg via INTRAVENOUS
  Filled 2019-10-09: qty 20

## 2019-10-09 MED ORDER — PALONOSETRON HCL INJECTION 0.25 MG/5ML
INTRAVENOUS | Status: AC
  Filled 2019-10-09: qty 5

## 2019-10-09 MED ORDER — PALONOSETRON HCL INJECTION 0.25 MG/5ML
0.2500 mg | Freq: Once | INTRAVENOUS | Status: AC
  Administered 2019-10-09: 0.25 mg via INTRAVENOUS

## 2019-10-09 MED ORDER — LEUCOVORIN CALCIUM INJECTION 350 MG
400.0000 mg/m2 | Freq: Once | INTRAVENOUS | Status: AC
  Administered 2019-10-09: 712 mg via INTRAVENOUS
  Filled 2019-10-09: qty 35.6

## 2019-10-09 NOTE — Telephone Encounter (Signed)
Appointments scheduled calendar printed per 7/7 los °

## 2019-10-09 NOTE — Patient Instructions (Signed)
[facility] Discharge Instructions for Patients Receiving Chemotherapy  Today you received the following chemotherapy agents 5FU, Oxaliplatin, Leucovorin  To help prevent nausea and vomiting after your treatment, we encourage you to take your nausea medication    If you develop nausea and vomiting that is not controlled by your nausea medication, call the clinic.   BELOW ARE SYMPTOMS THAT SHOULD BE REPORTED IMMEDIATELY:  *FEVER GREATER THAN 100.5 F  *CHILLS WITH OR WITHOUT FEVER  NAUSEA AND VOMITING THAT IS NOT CONTROLLED WITH YOUR NAUSEA MEDICATION  *UNUSUAL SHORTNESS OF BREATH  *UNUSUAL BRUISING OR BLEEDING  TENDERNESS IN MOUTH AND THROAT WITH OR WITHOUT PRESENCE OF ULCERS  *URINARY PROBLEMS  *BOWEL PROBLEMS  UNUSUAL RASH Items with * indicate a potential emergency and should be followed up as soon as possible.  Feel free to call the clinic should you have any questions or concerns. The clinic phone number is [phone].  Please show the CHEMO ALERT CARD at check-in to the Emergency Department and triage nurse.   

## 2019-10-09 NOTE — Progress Notes (Signed)
Ok to treat with ANC of 1.2 per Dr Ennever. dph 

## 2019-10-09 NOTE — Progress Notes (Addendum)
Hematology and Oncology Follow Up Visit  Jonathan Lewis [id number] [birth year] 79 y.o. 10/09/2019   Principle Diagnosis:  Stage IIIA (T3N2M0) adenocarcinoma of the stomach -- S/p distal gastrectomy on 03/19/2019 -- HER2-/PD-L1+  Current Therapy: Adjuvant FOLFOX -started on 05/13/2019, s/p cycle5   Interim History:  Jonathan Lewis is here today for follow-up and treatment. He is doing well but feels fatigued today because he did not sleep well last night. His ANC is improved at 1.2, WBC count 3.2, Hgb 11.4 and platelets 184.  CEA last visit was stable at 1.34.  No fever, chills, n/v, cough, rash, dizziness, SOB, chest pain, palpitations, abdominal pain or changes in bowel or bladder habits.  No episodes of bleeding. No bruising or petechiae.  No swelling or tenderness in his extremities at this time.  He has numbness and tingling in his hands, more prominent in the left hand, that comes and goes.  No falls or syncopal episodes to report.  He states that he has a good appetite and is staying well hydrated. His weight is improved at 181 lbs.   ECOG Performance Status: 1 - Symptomatic but completely ambulatory  Medications:  Allergies as of 10/09/2019   No Known Allergies     Medication List       Accurate as of October 09, 2019  9:50 AM. If you have any questions, ask your nurse or doctor.        acetaminophen 325 MG tablet Commonly known as: Tylenol Take 2 tablets (650 mg total) by mouth every 6 (six) hours as needed for mild pain.   dexamethasone 4 MG tablet Commonly known as: DECADRON Take 2 tablets (8 mg total) by mouth daily. Start day after chemotherapy for two days total.   docusate sodium 100 MG capsule Commonly known as: COLACE Take 1 capsule (100 mg total) by mouth daily.   famotidine 20 MG tablet Commonly known as: PEPCID Take 1 tablet (20 mg total) by mouth daily.   gabapentin 100 MG capsule Commonly known as: NEURONTIN Take 1 capsule (100 mg total)  by mouth every 12 (twelve) hours.   latanoprost 0.005 % ophthalmic solution Commonly known as: XALATAN Place 1 drop into both eyes at bedtime.   lidocaine-prilocaine cream Commonly known as: EMLA Apply 1 application topically as needed. Place on the port one hour before appointment.   lisinopril 10 MG tablet Commonly known as: ZESTRIL   LORazepam 1 MG tablet Commonly known as: ATIVAN Take 1 tablet (1 mg total) by mouth every 6 (six) hours as needed for anxiety.   ondansetron 8 MG tablet Commonly known as: ZOFRAN Take one tablet by mouth every 12 hours as needed for nausea/vomiting. Start 3 days after chemotherapy.   pantoprazole 40 MG tablet Commonly known as: PROTONIX   Systane Ultra 0.4-0.3 % Soln Generic drug: Polyethyl Glycol-Propyl Glycol Apply 1 drop to eye 3 (three) times daily as needed (dry eyes).   terazosin 2 MG capsule Commonly known as: HYTRIN       Allergies: No Known Allergies  Past Medical History, Surgical history, Social history, and Family History were reviewed and updated.  Review of Systems: All other 10 point review of systems is negative.   Physical Exam:  vitals were not taken for this visit.   Wt Readings from Last 3 Encounters:  09/25/19 178 lb 12.8 oz (81.1 kg)  09/04/19 175 lb (79.4 kg)  08/23/19 171 lb (77.6 kg)    Ocular: Sclerae unicteric, pupils equal, round and reactive to  light Ear-nose-throat: Oropharynx clear, dentition fair Lymphatic: No cervical or supraclavicular adenopathy Lungs no rales or rhonchi, good excursion bilaterally Heart regular rate and rhythm, no murmur appreciated Abd soft, nontender, positive bowel sounds, no liver or spleen tip palpated on exam, no fluid wave  MSK no focal spinal tenderness, no joint edema Neuro: non-focal, well-oriented, appropriate affect Breasts: Deferred   Lab Results  Component Value Date   WBC 3.2 (L) 10/09/2019   HGB 11.4 (L) 10/09/2019   HCT 34.9 (L) 10/09/2019   MCV 87.9  10/09/2019   PLT 184 10/09/2019   Lab Results  Component Value Date   FERRITIN 37 09/25/2019   IRON 98 09/25/2019   TIBC 328 09/25/2019   UIBC 230 09/25/2019   IRONPCTSAT 30 09/25/2019   Lab Results  Component Value Date   RBC 3.97 (L) 10/09/2019   No results found for: KPAFRELGTCHN, LAMBDASER, KAPLAMBRATIO No results found for: IGGSERUM, IGA, IGMSERUM No results found for: Ronnald Ramp, A1GS, A2GS, Tillman Sers, SPEI   Chemistry      Component Value Date/Time   NA 139 09/25/2019 1032   K 3.7 09/25/2019 1032   CL 106 09/25/2019 1032   CO2 26 09/25/2019 1032   BUN 18 09/25/2019 1032   CREATININE 0.76 09/25/2019 1032   CREATININE 1.06 07/16/2012 1038      Component Value Date/Time   CALCIUM 9.2 09/25/2019 1032   ALKPHOS 46 09/25/2019 1032   AST 21 09/25/2019 1032   ALT 16 09/25/2019 1032   BILITOT 0.4 09/25/2019 1032       Impression and Plan: Jonathan Lewis is a very pleasant 79 yo African American gentleman with stage IIIA (T3N2M0) adenocarcinoma of the stomach, HER2-/PD-L1+. He had a distal gastrectomy on 03/19/2019 and is currently receiving FOLFOX without the bolus and tolerating nicely.  We will proceed with treatment today as planned.  We fill out a handicap parking for him.  He is ok to go back to work if he wears a mask per Dr. Marin Olp.  We will plan to repeat scans on him in August.  We will see him again in 3 weeks.  He will contact our office with any questions or concerns. We can certainly see him sooner if needed.    Laverna Peace, NP 7/7/20219:50 AM

## 2019-10-09 NOTE — Patient Instructions (Signed)

## 2019-10-09 NOTE — Progress Notes (Signed)
Patient is neutropenic. Patient has had weight increase, but will not increase doses today per Dr. Marin Olp.

## 2019-10-10 ENCOUNTER — Other Ambulatory Visit: Payer: Self-pay | Admitting: Family

## 2019-10-10 ENCOUNTER — Telehealth: Payer: Self-pay | Admitting: Family

## 2019-10-10 DIAGNOSIS — C162 Malignant neoplasm of body of stomach: Secondary | ICD-10-CM

## 2019-10-10 DIAGNOSIS — M479 Spondylosis, unspecified: Secondary | ICD-10-CM

## 2019-10-10 NOTE — Telephone Encounter (Signed)
I was able to speak with Jonathan Lewis over the phone and go over his MRI results with him. He verbalized understanding and will wait to hear from Dr. Laurena Bering office to schedule an appointment. No questions at this time.

## 2019-10-10 NOTE — Telephone Encounter (Signed)
Left message with call back number to go over results. Did let him know on VM that referral to orthopedic doctor has been placed. Will await call back.

## 2019-10-11 ENCOUNTER — Inpatient Hospital Stay: Payer: Medicare PPO

## 2019-10-11 VITALS — BP 124/78 | HR 58 | Temp 98.4°F | Resp 18

## 2019-10-11 DIAGNOSIS — Z79899 Other long term (current) drug therapy: Secondary | ICD-10-CM | POA: Diagnosis not present

## 2019-10-11 DIAGNOSIS — R200 Anesthesia of skin: Secondary | ICD-10-CM | POA: Diagnosis not present

## 2019-10-11 DIAGNOSIS — C162 Malignant neoplasm of body of stomach: Secondary | ICD-10-CM

## 2019-10-11 DIAGNOSIS — Z5111 Encounter for antineoplastic chemotherapy: Secondary | ICD-10-CM | POA: Diagnosis not present

## 2019-10-11 DIAGNOSIS — Z9049 Acquired absence of other specified parts of digestive tract: Secondary | ICD-10-CM | POA: Diagnosis not present

## 2019-10-11 DIAGNOSIS — R202 Paresthesia of skin: Secondary | ICD-10-CM | POA: Diagnosis not present

## 2019-10-11 DIAGNOSIS — Z452 Encounter for adjustment and management of vascular access device: Secondary | ICD-10-CM | POA: Diagnosis not present

## 2019-10-11 DIAGNOSIS — R5383 Other fatigue: Secondary | ICD-10-CM | POA: Diagnosis not present

## 2019-10-11 DIAGNOSIS — C169 Malignant neoplasm of stomach, unspecified: Secondary | ICD-10-CM | POA: Diagnosis not present

## 2019-10-11 DIAGNOSIS — M25512 Pain in left shoulder: Secondary | ICD-10-CM | POA: Diagnosis not present

## 2019-10-11 MED ORDER — SODIUM CHLORIDE 0.9% FLUSH
10.0000 mL | INTRAVENOUS | Status: DC | PRN
  Administered 2019-10-11: 10 mL
  Filled 2019-10-11: qty 10

## 2019-10-11 MED ORDER — HEPARIN SOD (PORK) LOCK FLUSH 100 UNIT/ML IV SOLN
500.0000 [IU] | Freq: Once | INTRAVENOUS | Status: AC | PRN
  Administered 2019-10-11: 500 [IU]
  Filled 2019-10-11: qty 5

## 2019-10-11 NOTE — Progress Notes (Signed)
5-fu pump d/c'd  Per protocol. Port flushed

## 2019-10-11 NOTE — Patient Instructions (Signed)
Implanted Port Insertion, Care After °This sheet gives you information about how to care for yourself after your procedure. Your health care provider may also give you more specific instructions. If you have problems or questions, contact your health care provider. °What can I expect after the procedure? °After the procedure, it is common to have: °· Discomfort at the port insertion site. °· Bruising on the skin over the port. This should improve over 3-4 days. °Follow these instructions at home: °Port care °· After your port is placed, you will get a manufacturer's information card. The card has information about your port. Keep this card with you at all times. °· Take care of the port as told by your health care provider. Ask your health care provider if you or a family member can get training for taking care of the port at home. A home health care nurse may also take care of the port. °· Make sure to remember what type of port you have. °Incision care ° °  ° °· Follow instructions from your health care provider about how to take care of your port insertion site. Make sure you: °? Wash your hands with soap and water before and after you change your bandage (dressing). If soap and water are not available, use hand sanitizer. °? Change your dressing as told by your health care provider. °? Leave stitches (sutures), skin glue, or adhesive strips in place. These skin closures may need to stay in place for 2 weeks or longer. If adhesive strip edges start to loosen and curl up, you may trim the loose edges. Do not remove adhesive strips completely unless your health care provider tells you to do that. °· Check your port insertion site every day for signs of infection. Check for: °? Redness, swelling, or pain. °? Fluid or blood. °? Warmth. °? Pus or a bad smell. °Activity °· Return to your normal activities as told by your health care provider. Ask your health care provider what activities are safe for you. °· Do not  lift anything that is heavier than 10 lb (4.5 kg), or the limit that you are told, until your health care provider says that it is safe. °General instructions °· Take over-the-counter and prescription medicines only as told by your health care provider. °· Do not take baths, swim, or use a hot tub until your health care provider approves. Ask your health care provider if you may take showers. You may only be allowed to take sponge baths. °· Do not drive for 24 hours if you were given a sedative during your procedure. °· Wear a medical alert bracelet in case of an emergency. This will tell any health care providers that you have a port. °· Keep all follow-up visits as told by your health care provider. This is important. °Contact a health care provider if: °· You cannot flush your port with saline as directed, or you cannot draw blood from the port. °· You have a fever or chills. °· You have redness, swelling, or pain around your port insertion site. °· You have fluid or blood coming from your port insertion site. °· Your port insertion site feels warm to the touch. °· You have pus or a bad smell coming from the port insertion site. °Get help right away if: °· You have chest pain or shortness of breath. °· You have bleeding from your port that you cannot control. °Summary °· Take care of the port as told by your health   care provider. Keep the manufacturer's information card with you at all times. °· Change your dressing as told by your health care provider. °· Contact a health care provider if you have a fever or chills or if you have redness, swelling, or pain around your port insertion site. °· Keep all follow-up visits as told by your health care provider. °This information is not intended to replace advice given to you by your health care provider. Make sure you discuss any questions you have with your health care provider. °Document Revised: 10/17/2017 Document Reviewed: 10/17/2017 °Elsevier Patient Education ©  2020 [employer] ° °

## 2019-10-16 ENCOUNTER — Inpatient Hospital Stay: Payer: Medicare PPO

## 2019-10-16 ENCOUNTER — Inpatient Hospital Stay: Payer: Medicare PPO | Admitting: Family

## 2019-10-28 DIAGNOSIS — G959 Disease of spinal cord, unspecified: Secondary | ICD-10-CM | POA: Diagnosis not present

## 2019-10-28 DIAGNOSIS — M5412 Radiculopathy, cervical region: Secondary | ICD-10-CM | POA: Diagnosis not present

## 2019-10-30 ENCOUNTER — Inpatient Hospital Stay: Payer: Medicare PPO

## 2019-10-30 ENCOUNTER — Inpatient Hospital Stay (HOSPITAL_BASED_OUTPATIENT_CLINIC_OR_DEPARTMENT_OTHER): Payer: Medicare PPO | Admitting: Family

## 2019-10-30 ENCOUNTER — Other Ambulatory Visit: Payer: Self-pay

## 2019-10-30 ENCOUNTER — Encounter: Payer: Self-pay | Admitting: Family

## 2019-10-30 ENCOUNTER — Telehealth: Payer: Self-pay | Admitting: Hematology & Oncology

## 2019-10-30 VITALS — BP 143/94 | HR 58 | Temp 98.1°F | Resp 18 | Ht 68.0 in | Wt 186.0 lb

## 2019-10-30 DIAGNOSIS — R5383 Other fatigue: Secondary | ICD-10-CM | POA: Diagnosis not present

## 2019-10-30 DIAGNOSIS — C162 Malignant neoplasm of body of stomach: Secondary | ICD-10-CM | POA: Diagnosis not present

## 2019-10-30 DIAGNOSIS — R200 Anesthesia of skin: Secondary | ICD-10-CM | POA: Diagnosis not present

## 2019-10-30 DIAGNOSIS — Z79899 Other long term (current) drug therapy: Secondary | ICD-10-CM | POA: Diagnosis not present

## 2019-10-30 DIAGNOSIS — D509 Iron deficiency anemia, unspecified: Secondary | ICD-10-CM

## 2019-10-30 DIAGNOSIS — Z452 Encounter for adjustment and management of vascular access device: Secondary | ICD-10-CM | POA: Diagnosis not present

## 2019-10-30 DIAGNOSIS — Z95828 Presence of other vascular implants and grafts: Secondary | ICD-10-CM

## 2019-10-30 DIAGNOSIS — R202 Paresthesia of skin: Secondary | ICD-10-CM | POA: Diagnosis not present

## 2019-10-30 DIAGNOSIS — Z9049 Acquired absence of other specified parts of digestive tract: Secondary | ICD-10-CM | POA: Diagnosis not present

## 2019-10-30 DIAGNOSIS — M25512 Pain in left shoulder: Secondary | ICD-10-CM | POA: Diagnosis not present

## 2019-10-30 DIAGNOSIS — Z5111 Encounter for antineoplastic chemotherapy: Secondary | ICD-10-CM | POA: Diagnosis not present

## 2019-10-30 DIAGNOSIS — C169 Malignant neoplasm of stomach, unspecified: Secondary | ICD-10-CM | POA: Diagnosis not present

## 2019-10-30 LAB — CMP (CANCER CENTER ONLY)
ALT: 14 U/L (ref 0–44)
AST: 22 U/L (ref 15–41)
Albumin: 3.8 g/dL (ref 3.5–5.0)
Alkaline Phosphatase: 46 U/L (ref 38–126)
Anion gap: 6 (ref 5–15)
BUN: 12 mg/dL (ref 8–23)
CO2: 27 mmol/L (ref 22–32)
Calcium: 9.2 mg/dL (ref 8.9–10.3)
Chloride: 108 mmol/L (ref 98–111)
Creatinine: 0.76 mg/dL (ref 0.61–1.24)
GFR, Est AFR Am: >60 mL/min (ref >60)
GFR, Estimated: >60 mL/min (ref >60)
Glucose, Bld: 173 mg/dL — ABNORMAL HIGH (ref 70–99)
Potassium: 3.5 mmol/L (ref 3.5–5.1)
Sodium: 141 mmol/L (ref 135–145)
Total Bilirubin: 0.3 mg/dL (ref 0.3–1.2)
Total Protein: 6.2 g/dL — ABNORMAL LOW (ref 6.5–8.1)

## 2019-10-30 LAB — CBC WITH DIFFERENTIAL (CANCER CENTER ONLY)
Abs Immature Granulocytes: 0 10*3/uL (ref 0.00–0.07)
Basophils Absolute: 0 10*3/uL (ref 0.0–0.1)
Basophils Relative: 1 %
Eosinophils Absolute: 0 10*3/uL (ref 0.0–0.5)
Eosinophils Relative: 2 %
HCT: 32.8 % — ABNORMAL LOW (ref 39.0–52.0)
Hemoglobin: 10.8 g/dL — ABNORMAL LOW (ref 13.0–17.0)
Immature Granulocytes: 0 %
Lymphocytes Relative: 56 %
Lymphs Abs: 1.2 10*3/uL (ref 0.7–4.0)
MCH: 29 pg (ref 26.0–34.0)
MCHC: 32.9 g/dL (ref 30.0–36.0)
MCV: 88.2 fL (ref 80.0–100.0)
Monocytes Absolute: 0.3 10*3/uL (ref 0.1–1.0)
Monocytes Relative: 15 %
Neutro Abs: 0.6 10*3/uL — ABNORMAL LOW (ref 1.7–7.7)
Neutrophils Relative %: 26 %
Platelet Count: 163 10*3/uL (ref 150–400)
RBC: 3.72 MIL/uL — ABNORMAL LOW (ref 4.22–5.81)
RDW: 13.3 % (ref 11.5–15.5)
WBC Count: 2.1 10*3/uL — ABNORMAL LOW (ref 4.0–10.5)
nRBC: 0 % (ref 0.0–0.2)

## 2019-10-30 LAB — LACTATE DEHYDROGENASE: LDH: 206 U/L — ABNORMAL HIGH (ref 98–192)

## 2019-10-30 LAB — CEA (IN HOUSE-CHCC): CEA (CHCC-In House): 1.21 ng/mL (ref 0.00–5.00)

## 2019-10-30 MED ORDER — SODIUM CHLORIDE 0.9% FLUSH
10.0000 mL | Freq: Once | INTRAVENOUS | Status: AC
  Administered 2019-10-30: 10 mL via INTRAVENOUS
  Filled 2019-10-30: qty 10

## 2019-10-30 MED ORDER — HEPARIN SOD (PORK) LOCK FLUSH 100 UNIT/ML IV SOLN
500.0000 [IU] | Freq: Once | INTRAVENOUS | Status: AC
  Administered 2019-10-30: 500 [IU] via INTRAVENOUS
  Filled 2019-10-30: qty 5

## 2019-10-30 NOTE — Patient Instructions (Signed)

## 2019-10-30 NOTE — Addendum Note (Signed)
Addended by: Lucile Crater on: 10/30/2019 09:46 AM   Modules accepted: Orders

## 2019-10-30 NOTE — Progress Notes (Signed)
Hematology and Oncology Follow Up Visit  JAVONNE DORKO [id number] 02/11/1941 79 y.o. 10/30/2019   Principle Diagnosis:  Stage IIIA (T3N2M0) adenocarcinoma of the stomach -- S/p distal gastrectomy on 03/19/2019 -- HER2-/PD-L1+  Current Therapy: Adjuvant FOLFOX -started on 05/13/2019, s/p cycle5   Interim History:  Mr. Widmer is here today for follow-up and treatment. He is doing well but ANC has again dropped to 0.6.  Thankfully, he has had no issues with infection. No fever, chills, n/v, cough, rash, dizziness, SOB, chest pain, palpitations, abdominal pain or changes in bowel or bladder habits.  He has pain in the left shoulder and neck and has seen his neurologist. He needs to have surgery and is waiting until after treatment.  He has intermittent numbness and tingling in his fingertips.  No falls or syncopal episodes to report.  He has maintained a good appetite and is staying well hydrated. His weight is stable at 186 lbs.   ECOG Performance Status: 1 - Symptomatic but completely ambulatory  Medications:  Allergies as of 10/30/2019   No Known Allergies     Medication List       Accurate as of October 30, 2019  9:20 AM. If you have any questions, ask your nurse or doctor.        acetaminophen 325 MG tablet Commonly known as: Tylenol Take 2 tablets (650 mg total) by mouth every 6 (six) hours as needed for mild pain.   dexamethasone 4 MG tablet Commonly known as: DECADRON Take 2 tablets (8 mg total) by mouth daily. Start day after chemotherapy for two days total.   docusate sodium 100 MG capsule Commonly known as: COLACE Take 1 capsule (100 mg total) by mouth daily.   famotidine 20 MG tablet Commonly known as: PEPCID Take 1 tablet (20 mg total) by mouth daily.   gabapentin 100 MG capsule Commonly known as: NEURONTIN Take 1 capsule (100 mg total) by mouth every 12 (twelve) hours.   latanoprost 0.005 % ophthalmic solution Commonly known as:  XALATAN Place 1 drop into both eyes at bedtime.   lidocaine-prilocaine cream Commonly known as: EMLA Apply 1 application topically as needed. Place on the port one hour before appointment.   lisinopril 10 MG tablet Commonly known as: ZESTRIL   LORazepam 1 MG tablet Commonly known as: ATIVAN Take 1 tablet (1 mg total) by mouth every 6 (six) hours as needed for anxiety.   ondansetron 8 MG tablet Commonly known as: ZOFRAN Take one tablet by mouth every 12 hours as needed for nausea/vomiting. Start 3 days after chemotherapy.   pantoprazole 40 MG tablet Commonly known as: PROTONIX   Systane Ultra 0.4-0.3 % Soln Generic drug: Polyethyl Glycol-Propyl Glycol Apply 1 drop to eye 3 (three) times daily as needed (dry eyes).   terazosin 2 MG capsule Commonly known as: HYTRIN       Allergies: No Known Allergies  Past Medical History, Surgical history, Social history, and Family History were reviewed and updated.  Review of Systems: All other 10 point review of systems is negative.   Physical Exam:  height is _0  (1.727 m) and weight is 186 lb (84.4 kg). His oral temperature is 98.1 F (36.7 C). His blood pressure is 143/94 (abnormal) and his pulse is 58. His respiration is 18 and oxygen saturation is 100%.   Wt Readings from Last 3 Encounters:  10/30/19 186 lb (84.4 kg)  10/09/19 181 lb (82.1 kg)  09/25/19 178 lb 12.8 oz (81.1 kg)  Ocular: Sclerae unicteric, pupils equal, round and reactive to light Ear-nose-throat: Oropharynx clear, dentition fair Lymphatic: No cervical or supraclavicular adenopathy Lungs no rales or rhonchi, good excursion bilaterally Heart regular rate and rhythm, no murmur appreciated Abd soft, nontender, positive bowel sounds, no liver or spleen tip palpated on exam, no fluid wave  MSK no focal spinal tenderness, no joint edema Neuro: non-focal, well-oriented, appropriate affect Breasts: Deferred   Lab Results  Component Value Date   WBC 2.1  (L) 10/30/2019   HGB 10.8 (L) 10/30/2019   HCT 32.8 (L) 10/30/2019   MCV 88.2 10/30/2019   PLT 163 10/30/2019   Lab Results  Component Value Date   FERRITIN 37 09/25/2019   IRON 98 09/25/2019   TIBC 328 09/25/2019   UIBC 230 09/25/2019   IRONPCTSAT 30 09/25/2019   Lab Results  Component Value Date   RBC 3.72 (L) 10/30/2019   No results found for: KPAFRELGTCHN, LAMBDASER, KAPLAMBRATIO No results found for: IGGSERUM, IGA, IGMSERUM No results found for: Odetta Pink, SPEI   Chemistry      Component Value Date/Time   NA 140 10/09/2019 0935   K 3.7 10/09/2019 0935   CL 107 10/09/2019 0935   CO2 29 10/09/2019 0935   BUN 18 10/09/2019 0935   CREATININE 0.90 10/09/2019 0935   CREATININE 1.06 07/16/2012 1038      Component Value Date/Time   CALCIUM 9.3 10/09/2019 0935   ALKPHOS 45 10/09/2019 0935   AST 17 10/09/2019 0935   ALT 12 10/09/2019 0935   BILITOT 0.4 10/09/2019 0935       Impression and Plan: Mr. Valadez is a very pleasant 79 yo African American gentleman with stage IIIA (T3N2M0) adenocarcinoma of the stomach, HER2-/PD-L1+. He had a distal gastrectomy on 12/15/2020andis currently receiving FOLFOX, s/p cycle 6.  He has a low ANC at this time so we will hold treatment.  I spoke with Dr. Marin Olp and we will get repeat scans next week. If they are clear we will stop treatment and get him set up for surgery for his neck and shoulder.  He is in agreement with the plan and can contact our office with any questions or concerns.  We will see him again in another 3 weeks.   Laverna Peace, NP 7/28/20219:20 AM

## 2019-10-30 NOTE — Telephone Encounter (Signed)
Per 7/28 appointments already scheduled/ CT scan will be scheduled once Prior Auth has been obtained. I was not able to provide contrast due to patient leaving before los was placed.

## 2019-10-31 ENCOUNTER — Encounter: Payer: Self-pay | Admitting: *Deleted

## 2019-10-31 NOTE — Progress Notes (Signed)
I faxed the office note from 10/30/19 to [facility]. Fax # [phone] attention Neita Garnet. Page five discusses getting new scans, stopping chemotherapy and get him set up for neck and shoulder surgery.

## 2019-11-01 ENCOUNTER — Telehealth: Payer: Self-pay | Admitting: Hematology & Oncology

## 2019-11-01 ENCOUNTER — Inpatient Hospital Stay: Payer: Medicare PPO

## 2019-11-01 NOTE — Telephone Encounter (Signed)
lmom to inform patient of changed appt time 8/18 at 0830. Also informed pt to pick up contrast from our office if he does not have it already

## 2019-11-03 DIAGNOSIS — E440 Moderate protein-calorie malnutrition: Secondary | ICD-10-CM | POA: Diagnosis not present

## 2019-11-03 DIAGNOSIS — C169 Malignant neoplasm of stomach, unspecified: Secondary | ICD-10-CM | POA: Diagnosis not present

## 2019-11-19 DIAGNOSIS — H35371 Puckering of macula, right eye: Secondary | ICD-10-CM | POA: Diagnosis not present

## 2019-11-19 DIAGNOSIS — E119 Type 2 diabetes mellitus without complications: Secondary | ICD-10-CM | POA: Diagnosis not present

## 2019-11-19 DIAGNOSIS — H52223 Regular astigmatism, bilateral: Secondary | ICD-10-CM | POA: Diagnosis not present

## 2019-11-19 DIAGNOSIS — H04123 Dry eye syndrome of bilateral lacrimal glands: Secondary | ICD-10-CM | POA: Diagnosis not present

## 2019-11-19 DIAGNOSIS — H353131 Nonexudative age-related macular degeneration, bilateral, early dry stage: Secondary | ICD-10-CM | POA: Diagnosis not present

## 2019-11-19 DIAGNOSIS — H401231 Low-tension glaucoma, bilateral, mild stage: Secondary | ICD-10-CM | POA: Diagnosis not present

## 2019-11-19 DIAGNOSIS — H35033 Hypertensive retinopathy, bilateral: Secondary | ICD-10-CM | POA: Diagnosis not present

## 2019-11-19 LAB — HM DIABETES EYE EXAM

## 2019-11-20 ENCOUNTER — Ambulatory Visit (HOSPITAL_BASED_OUTPATIENT_CLINIC_OR_DEPARTMENT_OTHER)
Admission: RE | Admit: 2019-11-20 | Discharge: 2019-11-20 | Disposition: A | Discharge status: Home / Self Care | Payer: Medicare PPO | Source: Ambulatory Visit | Attending: Family | Admitting: Family

## 2019-11-20 ENCOUNTER — Inpatient Hospital Stay: Payer: Medicare PPO

## 2019-11-20 ENCOUNTER — Encounter (HOSPITAL_BASED_OUTPATIENT_CLINIC_OR_DEPARTMENT_OTHER): Payer: Self-pay

## 2019-11-20 ENCOUNTER — Inpatient Hospital Stay (HOSPITAL_BASED_OUTPATIENT_CLINIC_OR_DEPARTMENT_OTHER): Payer: Medicare PPO | Admitting: Hematology & Oncology

## 2019-11-20 ENCOUNTER — Inpatient Hospital Stay: Payer: Medicare PPO | Attending: Hematology & Oncology

## 2019-11-20 ENCOUNTER — Encounter: Payer: Self-pay | Admitting: Hematology & Oncology

## 2019-11-20 ENCOUNTER — Other Ambulatory Visit: Payer: Self-pay

## 2019-11-20 VITALS — BP 136/81 | HR 61 | Temp 98.6°F | Resp 18 | Wt 183.2 lb

## 2019-11-20 DIAGNOSIS — Z9049 Acquired absence of other specified parts of digestive tract: Secondary | ICD-10-CM | POA: Diagnosis not present

## 2019-11-20 DIAGNOSIS — Z5111 Encounter for antineoplastic chemotherapy: Secondary | ICD-10-CM | POA: Diagnosis not present

## 2019-11-20 DIAGNOSIS — Z7984 Long term (current) use of oral hypoglycemic drugs: Secondary | ICD-10-CM | POA: Diagnosis not present

## 2019-11-20 DIAGNOSIS — R109 Unspecified abdominal pain: Secondary | ICD-10-CM | POA: Insufficient documentation

## 2019-11-20 DIAGNOSIS — K5730 Diverticulosis of large intestine without perforation or abscess without bleeding: Secondary | ICD-10-CM | POA: Diagnosis not present

## 2019-11-20 DIAGNOSIS — Z79899 Other long term (current) drug therapy: Secondary | ICD-10-CM | POA: Diagnosis not present

## 2019-11-20 DIAGNOSIS — C169 Malignant neoplasm of stomach, unspecified: Secondary | ICD-10-CM | POA: Insufficient documentation

## 2019-11-20 DIAGNOSIS — C162 Malignant neoplasm of body of stomach: Secondary | ICD-10-CM | POA: Insufficient documentation

## 2019-11-20 DIAGNOSIS — Z5189 Encounter for other specified aftercare: Secondary | ICD-10-CM | POA: Insufficient documentation

## 2019-11-20 DIAGNOSIS — J8410 Pulmonary fibrosis, unspecified: Secondary | ICD-10-CM | POA: Diagnosis not present

## 2019-11-20 DIAGNOSIS — R202 Paresthesia of skin: Secondary | ICD-10-CM | POA: Insufficient documentation

## 2019-11-20 LAB — CBC WITH DIFFERENTIAL (CANCER CENTER ONLY)
Abs Immature Granulocytes: 0 10*3/uL (ref 0.00–0.07)
Basophils Absolute: 0 10*3/uL (ref 0.0–0.1)
Basophils Relative: 1 %
Eosinophils Absolute: 0 10*3/uL (ref 0.0–0.5)
Eosinophils Relative: 1 %
HCT: 35.9 % — ABNORMAL LOW (ref 39.0–52.0)
Hemoglobin: 12 g/dL — ABNORMAL LOW (ref 13.0–17.0)
Immature Granulocytes: 0 %
Lymphocytes Relative: 50 %
Lymphs Abs: 1.5 10*3/uL (ref 0.7–4.0)
MCH: 29.1 pg (ref 26.0–34.0)
MCHC: 33.4 g/dL (ref 30.0–36.0)
MCV: 87.1 fL (ref 80.0–100.0)
Monocytes Absolute: 0.4 10*3/uL (ref 0.1–1.0)
Monocytes Relative: 13 %
Neutro Abs: 1 10*3/uL — ABNORMAL LOW (ref 1.7–7.7)
Neutrophils Relative %: 35 %
Platelet Count: 181 10*3/uL (ref 150–400)
RBC: 4.12 MIL/uL — ABNORMAL LOW (ref 4.22–5.81)
RDW: 12.6 % (ref 11.5–15.5)
WBC Count: 2.9 10*3/uL — ABNORMAL LOW (ref 4.0–10.5)
nRBC: 0 % (ref 0.0–0.2)

## 2019-11-20 LAB — CMP (CANCER CENTER ONLY)
ALT: 13 U/L (ref 0–44)
AST: 22 U/L (ref 15–41)
Albumin: 4 g/dL (ref 3.5–5.0)
Alkaline Phosphatase: 49 U/L (ref 38–126)
Anion gap: 5 (ref 5–15)
BUN: 18 mg/dL (ref 8–23)
CO2: 30 mmol/L (ref 22–32)
Calcium: 9.8 mg/dL (ref 8.9–10.3)
Chloride: 104 mmol/L (ref 98–111)
Creatinine: 0.9 mg/dL (ref 0.61–1.24)
GFR, Est AFR Am: >60 mL/min (ref >60)
GFR, Estimated: >60 mL/min (ref >60)
Glucose, Bld: 103 mg/dL — ABNORMAL HIGH (ref 70–99)
Potassium: 4 mmol/L (ref 3.5–5.1)
Sodium: 139 mmol/L (ref 135–145)
Total Bilirubin: 0.3 mg/dL (ref 0.3–1.2)
Total Protein: 7 g/dL (ref 6.5–8.1)

## 2019-11-20 LAB — CEA (IN HOUSE-CHCC): CEA (CHCC-In House): 1.37 ng/mL (ref 0.00–5.00)

## 2019-11-20 LAB — LACTATE DEHYDROGENASE: LDH: 193 U/L — ABNORMAL HIGH (ref 98–192)

## 2019-11-20 MED ORDER — SODIUM CHLORIDE 0.9 % IV SOLN
1920.0000 mg/m2 | INTRAVENOUS | Status: DC
  Administered 2019-11-20: 3400 mg via INTRAVENOUS
  Filled 2019-11-20: qty 68

## 2019-11-20 MED ORDER — LEUCOVORIN CALCIUM INJECTION 350 MG
400.0000 mg/m2 | Freq: Once | INTRAVENOUS | Status: AC
  Administered 2019-11-20: 712 mg via INTRAVENOUS
  Filled 2019-11-20: qty 35.6

## 2019-11-20 MED ORDER — OXALIPLATIN CHEMO INJECTION 100 MG/20ML
68.0000 mg/m2 | Freq: Once | INTRAVENOUS | Status: AC
  Administered 2019-11-20: 120 mg via INTRAVENOUS
  Filled 2019-11-20: qty 20

## 2019-11-20 MED ORDER — PALONOSETRON HCL INJECTION 0.25 MG/5ML
INTRAVENOUS | Status: AC
  Filled 2019-11-20: qty 5

## 2019-11-20 MED ORDER — DEXTROSE 5 % IV SOLN
Freq: Once | INTRAVENOUS | Status: AC
  Filled 2019-11-20: qty 250

## 2019-11-20 MED ORDER — PALONOSETRON HCL INJECTION 0.25 MG/5ML
0.2500 mg | Freq: Once | INTRAVENOUS | Status: AC
  Administered 2019-11-20: 0.25 mg via INTRAVENOUS

## 2019-11-20 MED ORDER — SODIUM CHLORIDE 0.9 % IV SOLN
10.0000 mg | Freq: Once | INTRAVENOUS | Status: AC
  Administered 2019-11-20: 10 mg via INTRAVENOUS
  Filled 2019-11-20: qty 10

## 2019-11-20 MED ORDER — IOHEXOL 300 MG/ML  SOLN
100.0000 mL | Freq: Once | INTRAMUSCULAR | Status: AC | PRN
  Administered 2019-11-20: 100 mL via INTRAVENOUS

## 2019-11-20 NOTE — Addendum Note (Signed)
Addended by: Burney Gauze R on: 11/20/2019 11:15 AM   Modules accepted: Orders

## 2019-11-20 NOTE — Progress Notes (Addendum)
Hematology and Oncology Follow Up Visit  Jonathan Lewis [id number] [birth year] 79 y.o. 11/20/2019   Principle Diagnosis:  Stage IIIA (T3N2M0) adenocarcinoma of the stomach -- S/p distal gastrectomy on 03/19/2019 -- HER2-/PD-L1+  Current Therapy: Adjuvant FOLFOX -started on 05/13/2019, s/p cycle#6   Interim History:  [name] is here today for follow-up and treatment.  He did have a CT scan done today.  Unfortunately, the results not yet back.  However, I looked at the CT scan myself.  I do not see anything that was obvious with respect to recurrent/metastatic gastric cancer.  There has been no problems with nausea or vomiting.  He has had a little bit of discomfort in the upper abdomen.  Actively this is probably scar tissue.  He has had no change in bowel or bladder habits.  He has not yet had surgery for his cervical spine.  He has severe arthritis in the cervical spine with some flattening of the spinal cord.  He has not had no change in neurological symptoms.  There is no weakness in the arms.  He has some tingling in the fingers.  There has been no fever.  He has had no rashes.  He has had no leg swelling.  Overall, his performance status is ECOG 1.   Medications:  Allergies as of 11/20/2019   No Known Allergies     Medication List       Accurate as of November 20, 2019 10:11 AM. If you have any questions, ask your nurse or doctor.        acetaminophen 325 MG tablet Commonly known as: Tylenol Take 2 tablets (650 mg total) by mouth every 6 (six) hours as needed for mild pain.   dexamethasone 4 MG tablet Commonly known as: DECADRON Take 2 tablets (8 mg total) by mouth daily. Start day after chemotherapy for two days total.   docusate sodium 100 MG capsule Commonly known as: COLACE Take 1 capsule (100 mg total) by mouth daily.   famotidine 20 MG tablet Commonly known as: PEPCID Take 1 tablet (20 mg total) by mouth daily.   gabapentin 100 MG  capsule Commonly known as: NEURONTIN Take 1 capsule (100 mg total) by mouth every 12 (twelve) hours.   gabapentin 250 MG/5ML solution Commonly known as: NEURONTIN Take by mouth.   latanoprost 0.005 % ophthalmic solution Commonly known as: XALATAN Place 1 drop into both eyes at bedtime.   lidocaine-prilocaine cream Commonly known as: EMLA Apply 1 application topically as needed. Place on the port one hour before appointment.   lisinopril 10 MG tablet Commonly known as: ZESTRIL   LORazepam 1 MG tablet Commonly known as: ATIVAN Take 1 tablet (1 mg total) by mouth every 6 (six) hours as needed for anxiety.   LORazepam 1 MG tablet Commonly known as: ATIVAN SMARTSIG:By Mouth   metFORMIN 500 MG 24 hr tablet Commonly known as: GLUCOPHAGE-XR Take 500 mg by mouth every morning.   ondansetron 8 MG tablet Commonly known as: ZOFRAN Take one tablet by mouth every 12 hours as needed for nausea/vomiting. Start 3 days after chemotherapy.   pantoprazole 40 MG tablet Commonly known as: PROTONIX   Systane Ultra 0.4-0.3 % Soln Generic drug: Polyethyl Glycol-Propyl Glycol Apply 1 drop to eye 3 (three) times daily as needed (dry eyes).   terazosin 2 MG capsule Commonly known as: HYTRIN       Allergies: No Known Allergies  Past Medical History, Surgical history, Social history, and Family History were reviewed  and updated.  Review of Systems: Review of Systems  Constitutional: Negative.   HENT: Negative.   Eyes: Negative.   Respiratory: Negative.   Cardiovascular: Negative.   Gastrointestinal: Positive for abdominal pain.  Genitourinary: Negative.   Musculoskeletal: Negative.   Skin: Negative.   Neurological: Positive for tingling.  Endo/Heme/Allergies: Negative.   Psychiatric/Behavioral: Negative.      Physical Exam:  weight is 183 lb 4 oz (83.1 kg). His oral temperature is 98.6 F (37 C). His blood pressure is 136/81 and his pulse is 61. His respiration is 18 and  oxygen saturation is 99%.   Wt Readings from Last 3 Encounters:  11/20/19 183 lb 4 oz (83.1 kg)  10/30/19 186 lb (84.4 kg)  10/09/19 181 lb (82.1 kg)      Physical Activity: Inactive  . Days of Exercise per Week: 0 days  . Minutes of Exercise per Session: 0 min    Physical Exam Vitals reviewed.  HENT:     Head: Normocephalic and atraumatic.  Eyes:     Pupils: Pupils are equal, round, and reactive to light.  Cardiovascular:     Rate and Rhythm: Normal rate and regular rhythm.     Heart sounds: Normal heart sounds.  Pulmonary:     Effort: Pulmonary effort is normal.     Breath sounds: Normal breath sounds.  Abdominal:     General: Bowel sounds are normal.     Palpations: Abdomen is soft.     Comments: Abdominal exam is soft.  He has well-healed laparotomy scars.  There is no tenderness to palpation.  He has no guarding or rebound tenderness.  There is no fluid wave.  There is no palpable liver or spleen tip.  Musculoskeletal:        General: No tenderness or deformity. Normal range of motion.     Cervical back: Normal range of motion.  Lymphadenopathy:     Cervical: No cervical adenopathy.  Skin:    General: Skin is warm and dry.     Findings: No erythema or rash.  Neurological:     Mental Status: He is alert and oriented to person, place, and time.  Psychiatric:        Behavior: Behavior normal.        Thought Content: Thought content normal.        Judgment: Judgment normal.     Lab Results  Component Value Date   WBC 2.9 (L) 11/20/2019   HGB 12.0 (L) 11/20/2019   HCT 35.9 (L) 11/20/2019   MCV 87.1 11/20/2019   PLT 181 11/20/2019   Lab Results  Component Value Date   FERRITIN 37 09/25/2019   IRON 98 09/25/2019   TIBC 328 09/25/2019   UIBC 230 09/25/2019   IRONPCTSAT 30 09/25/2019   Lab Results  Component Value Date   RBC 4.12 (L) 11/20/2019   No results found for: KPAFRELGTCHN, LAMBDASER, KAPLAMBRATIO No results found for: IGGSERUM, IGA,  IGMSERUM No results found for: Ronnald Ramp, A1GS, A2GS, BETS, BETA2SER, GAMS, MSPIKE, SPEI   Chemistry      Component Value Date/Time   NA 139 11/20/2019 0900   K 4.0 11/20/2019 0900   CL 104 11/20/2019 0900   CO2 30 11/20/2019 0900   BUN 18 11/20/2019 0900   CREATININE 0.90 11/20/2019 0900   CREATININE 1.06 07/16/2012 1038      Component Value Date/Time   CALCIUM 9.8 11/20/2019 0900   ALKPHOS 49 11/20/2019 0900   AST 22 11/20/2019 0900  ALT 13 11/20/2019 0900   BILITOT 0.3 11/20/2019 0900       Impression and Plan: Mr. Tabet is a very pleasant 79 yo African American gentleman with stage IIIA (T3N2M0) adenocarcinoma of the stomach, HER2-/PD-L1+. He had a distal gastrectomy on 12/15/2020andis currently receiving FOLFOX, s/p cycle 6.   I think we are going to have to give him Neulasta after treatment.  He only has 2 more treatments left, including this.  I think we are going to try to get him through his adjuvant chemotherapy.  So far his neurological symptoms have not worsened.  Hopefully, we have the luxury of being able to try to move him through treatment.  Again we have just 2 more treatments left.  We will try to get him on schedule.  Hopefully, the CAT scan will not show [place] any issues with respect to recurrent disease.  We will plan to get him back in 3 weeks.  We will get him back on that will be his final and eighth cycle of treatment.  Volanda Napoleon, MD 8/18/202110:11 AM   ADDENDUM: At this point, we really need to make sure that he is able to get his surgery for the cervical spine issue.  He will have his last chemotherapy scheduled for September 8.  The recovery time from his last chemotherapy should be about 1 month.  I would like to believe that he should be ready for cervical spine surgery in early October.  The CAT scan that he had done today did not show any evidence of recurrent malignancy.  We will plan to follow him up after his last  cycle of chemotherapy so that we can ensure that his blood counts will be adequate for cervical spine surgery.  Lattie Haw, MD

## 2019-11-20 NOTE — Progress Notes (Signed)
Pt with weight gain, but is neutropenic. Do not increase doses today per Dr. Marin Olp.

## 2019-11-20 NOTE — Progress Notes (Signed)
Reviewed pt labs with Dr. Ennever and pt ok to treat with ANC 1.0  

## 2019-11-20 NOTE — Patient Instructions (Addendum)
[facility] Discharge Instructions for Patients Receiving Chemotherapy  Today you received the following chemotherapy agents 5FU, Oxaliplatin, Leucovorin   To help prevent nausea and vomiting after your treatment, we encourage you to take your nausea medication    If you develop nausea and vomiting that is not controlled by your nausea medication, call the clinic.   BELOW ARE SYMPTOMS THAT SHOULD BE REPORTED IMMEDIATELY:  *FEVER GREATER THAN 100.5 F  *CHILLS WITH OR WITHOUT FEVER  NAUSEA AND VOMITING THAT IS NOT CONTROLLED WITH YOUR NAUSEA MEDICATION  *UNUSUAL SHORTNESS OF BREATH  *UNUSUAL BRUISING OR BLEEDING  TENDERNESS IN MOUTH AND THROAT WITH OR WITHOUT PRESENCE OF ULCERS  *URINARY PROBLEMS  *BOWEL PROBLEMS  UNUSUAL RASH Items with * indicate a potential emergency and should be followed up as soon as possible.  Feel free to call the clinic should you have any questions or concerns. The clinic phone number is [phone].  Please show the [place] at check-in to the Emergency Department and triage nurse.  Leucovorin injection What is this medicine? LEUCOVORIN (loo koe VOR in) is used to prevent or treat the harmful effects of some medicines. This medicine is used to treat anemia caused by a low amount of folic acid in the body. It is also used with 5-fluorouracil (5-FU) to treat colon cancer. This medicine may be used for other purposes; ask your health care provider or pharmacist if you have questions. What should I tell my health care provider before I take this medicine? They need to know if you have any of these conditions:  anemia from low levels of vitamin B-12 in the blood  an unusual or allergic reaction to leucovorin, folic acid, other medicines, foods, dyes, or preservatives  pregnant or trying to get pregnant  breast-feeding How should I use this medicine? This medicine is for injection into a muscle or into a vein. It is given  by a health care professional in a hospital or clinic setting. Talk to your pediatrician regarding the use of this medicine in children. Special care may be needed. Overdosage: If you think you have taken too much of this medicine contact a poison control center or emergency room at once. NOTE: This medicine is only for you. Do not share this medicine with others. What if I miss a dose? This does not apply. What may interact with this medicine?  capecitabine  fluorouracil  phenobarbital  phenytoin  primidone  trimethoprim-sulfamethoxazole This list may not describe all possible interactions. Give your health care provider a list of all the medicines, herbs, non-prescription drugs, or dietary supplements you use. Also tell them if you smoke, drink alcohol, or use illegal drugs. Some items may interact with your medicine. What should I watch for while using this medicine? Your condition will be monitored carefully while you are receiving this medicine. This medicine may increase the side effects of 5-fluorouracil, 5-FU. Tell your doctor or health care professional if you have diarrhea or mouth sores that do not get better or that get worse. What side effects may I notice from receiving this medicine? Side effects that you should report to your doctor or health care professional as soon as possible:  allergic reactions like skin rash, itching or hives, swelling of the face, lips, or tongue  breathing problems  fever, infection  mouth sores  unusual bleeding or bruising  unusually weak or tired Side effects that usually do not require medical attention (report to your doctor or  health care professional if they continue or are bothersome):  constipation or diarrhea  loss of appetite  nausea, vomiting This list may not describe all possible side effects. Call your doctor for medical advice about side effects. You may report side effects to FDA at 1-800-FDA-1088. Where should I  keep my medicine? This drug is given in a hospital or clinic and will not be stored at home. NOTE: This sheet is a summary. It may not cover all possible information. If you have questions about this medicine, talk to your doctor, pharmacist, or health care provider.  2020 Elsevier/Gold Standard (2007-09-25 16:50:29) Fluorouracil, 5-FU injection What is this medicine? FLUOROURACIL, 5-FU (flure oh YOOR a sil) is a chemotherapy drug. It slows the growth of cancer cells. This medicine is used to treat many types of cancer like breast cancer, colon or rectal cancer, pancreatic cancer, and stomach cancer. This medicine may be used for other purposes; ask your health care provider or pharmacist if you have questions. COMMON BRAND NAME(S): Adrucil What should I tell my health care provider before I take this medicine? They need to know if you have any of these conditions:  blood disorders  dihydropyrimidine dehydrogenase (DPD) deficiency  infection (especially a virus infection such as chickenpox, cold sores, or herpes)  kidney disease  liver disease  malnourished, poor nutrition  recent or ongoing radiation therapy  an unusual or allergic reaction to fluorouracil, other chemotherapy, other medicines, foods, dyes, or preservatives  pregnant or trying to get pregnant  breast-feeding How should I use this medicine? This drug is given as an infusion or injection into a vein. It is administered in a hospital or clinic by a specially trained health care professional. Talk to your pediatrician regarding the use of this medicine in children. Special care may be needed. Overdosage: If you think you have taken too much of this medicine contact a poison control center or emergency room at once. NOTE: This medicine is only for you. Do not share this medicine with others. What if I miss a dose? It is important not to miss your dose. Call your doctor or health care professional if you are unable to  keep an appointment. What may interact with this medicine?  allopurinol  cimetidine  dapsone  digoxin  hydroxyurea  leucovorin  levamisole  medicines for seizures like ethotoin, fosphenytoin, phenytoin  medicines to increase blood counts like filgrastim, pegfilgrastim, sargramostim  medicines that treat or prevent blood clots like warfarin, enoxaparin, and dalteparin  methotrexate  metronidazole  pyrimethamine  some other chemotherapy drugs like busulfan, cisplatin, estramustine, vinblastine  trimethoprim  trimetrexate  vaccines Talk to your doctor or health care professional before taking any of these medicines:  acetaminophen  aspirin  ibuprofen  ketoprofen  naproxen This list may not describe all possible interactions. Give your health care provider a list of all the medicines, herbs, non-prescription drugs, or dietary supplements you use. Also tell them if you smoke, drink alcohol, or use illegal drugs. Some items may interact with your medicine. What should I watch for while using this medicine? Visit your doctor for checks on your progress. This drug may make you feel generally unwell. This is not uncommon, as chemotherapy can affect healthy cells as well as cancer cells. Report any side effects. Continue your course of treatment even though you feel ill unless your doctor tells you to stop. In some cases, you may be given additional medicines to help with side effects. Follow all directions for their  use. Call your doctor or health care professional for advice if you get a fever, chills or sore throat, or other symptoms of a cold or flu. Do not treat yourself. This drug decreases your body's ability to fight infections. Try to avoid being around people who are sick. This medicine may increase your risk to bruise or bleed. Call your doctor or health care professional if you notice any unusual bleeding. Be careful brushing and flossing your teeth or using a  toothpick because you may get an infection or bleed more easily. If you have any dental work done, tell your dentist you are receiving this medicine. Avoid taking products that contain aspirin, acetaminophen, ibuprofen, naproxen, or ketoprofen unless instructed by your doctor. These medicines may hide a fever. Do not become pregnant while taking this medicine. Women should inform their doctor if they wish to become pregnant or think they might be pregnant. There is a potential for serious side effects to an unborn child. Talk to your health care professional or pharmacist for more information. Do not breast-feed an infant while taking this medicine. Men should inform their doctor if they wish to father a child. This medicine may lower sperm counts. Do not treat diarrhea with over the counter products. Contact your doctor if you have diarrhea that lasts more than 2 days or if it is severe and watery. This medicine can make you more sensitive to the sun. Keep out of the sun. If you cannot avoid being in the sun, wear protective clothing and use sunscreen. Do not use sun lamps or tanning beds/booths. What side effects may I notice from receiving this medicine? Side effects that you should report to your doctor or health care professional as soon as possible:  allergic reactions like skin rash, itching or hives, swelling of the face, lips, or tongue  low blood counts - this medicine may decrease the number of white blood cells, red blood cells and platelets. You may be at increased risk for infections and bleeding.  signs of infection - fever or chills, cough, sore throat, pain or difficulty passing urine  signs of decreased platelets or bleeding - bruising, pinpoint red spots on the skin, black, tarry stools, blood in the urine  signs of decreased red blood cells - unusually weak or tired, fainting spells, lightheadedness  breathing problems  changes in vision  chest pain  mouth sores  nausea  and vomiting  pain, swelling, redness at site where injected  pain, tingling, numbness in the hands or feet  redness, swelling, or sores on hands or feet  stomach pain  unusual bleeding Side effects that usually do not require medical attention (report to your doctor or health care professional if they continue or are bothersome):  changes in finger or toe nails  diarrhea  dry or itchy skin  hair loss  headache  loss of appetite  sensitivity of eyes to the light  stomach upset  unusually teary eyes This list may not describe all possible side effects. Call your doctor for medical advice about side effects. You may report side effects to FDA at 1-800-FDA-1088. Where should I keep my medicine? This drug is given in a hospital or clinic and will not be stored at home. NOTE: This sheet is a summary. It may not cover all possible information. If you have questions about this medicine, talk to your doctor, pharmacist, or health care provider.  2020 Elsevier/Gold Standard (2007-07-25 13:53:16) Palonosetron Injection What is this medicine? PALONOSETRON (pal  oh NOE se tron) is used to prevent nausea and vomiting caused by chemotherapy. It also helps prevent delayed nausea and vomiting that may occur a few days after your treatment. This medicine may be used for other purposes; ask your health care provider or pharmacist if you have questions. COMMON BRAND NAME(S): Aloxi What should I tell my health care provider before I take this medicine? They need to know if you have any of these conditions:  an unusual or allergic reaction to palonosetron, dolasetron, granisetron, ondansetron, other medicines, foods, dyes, or preservatives  pregnant or trying to get pregnant  breast-feeding How should I use this medicine? This medicine is for infusion into a vein. It is given by a health care professional in a hospital or clinic setting. Talk to your pediatrician regarding the use of  this medicine in children. While this drug may be prescribed for children as young as 1 month for selected conditions, precautions do apply. Overdosage: If you think you have taken too much of this medicine contact a poison control center or emergency room at once. NOTE: This medicine is only for you. Do not share this medicine with others. What if I miss a dose? This does not apply. What may interact with this medicine?  certain medicines for depression, anxiety, or psychotic disturbances  fentanyl  linezolid  MAOIs like Carbex, Eldepryl, Marplan, Nardil, and Parnate  methylene blue (injected into a vein)  tramadol This list may not describe all possible interactions. Give your health care provider a list of all the medicines, herbs, non-prescription drugs, or dietary supplements you use. Also tell them if you smoke, drink alcohol, or use illegal drugs. Some items may interact with your medicine. What should I watch for while using this medicine? Your condition will be monitored carefully while you are receiving this medicine. What side effects may I notice from receiving this medicine? Side effects that you should report to your doctor or health care professional as soon as possible:  allergic reactions like skin rash, itching or hives, swelling of the face, lips, or tongue  breathing problems  confusion  dizziness  fast, irregular heartbeat  fever and chills  loss of balance or coordination  seizures  sweating  swelling of the hands and feet  tremors  unusually weak or tired Side effects that usually do not require medical attention (report to your doctor or health care professional if they continue or are bothersome):  constipation or diarrhea  headache This list may not describe all possible side effects. Call your doctor for medical advice about side effects. You may report side effects to FDA at 1-800-FDA-1088. Where should I keep my medicine? This drug is  given in a hospital or clinic and will not be stored at home. NOTE: This sheet is a summary. It may not cover all possible information. If you have questions about this medicine, talk to your doctor, pharmacist, or health care provider.  2020 Elsevier/Gold Standard (2013-01-25 10:38:36) Dexamethasone injection What is this medicine? DEXAMETHASONE (dex a METH a sone) is a corticosteroid. It is used to treat inflammation of the skin, joints, lungs, and other organs. Common conditions treated include asthma, allergies, and arthritis. It is also used for other conditions, like blood disorders and diseases of the adrenal glands. This medicine may be used for other purposes; ask your health care provider or pharmacist if you have questions. COMMON BRAND NAME(S): Decadron, DoubleDex, Simplist Dexamethasone, Solurex What should I tell my health care provider before I  take this medicine? They need to know if you have any of these conditions:  Cushing's syndrome  diabetes  glaucoma  heart disease  high blood pressure  infection like herpes, measles, tuberculosis, or chickenpox  kidney disease  liver disease  mental illness  myasthenia gravis  osteoporosis  previous heart attack  seizures  stomach or intestine problems  thyroid disease  an unusual or allergic reaction to dexamethasone, corticosteroids, other medicines, lactose, foods, dyes, or preservatives  pregnant or trying to get pregnant  breast-feeding How should I use this medicine? This medicine is for injection into a muscle, joint, lesion, soft tissue, or vein. It is given by a health care professional in a hospital or clinic setting. Talk to your pediatrician regarding the use of this medicine in children. Special care may be needed. Overdosage: If you think you have taken too much of this medicine contact a poison control center or emergency room at once. NOTE: This medicine is only for you. Do not share this  medicine with others. What if I miss a dose? This may not apply. If you are having a series of injections over a prolonged period, try not to miss an appointment. Call your doctor or health care professional to reschedule if you are unable to keep an appointment. What may interact with this medicine? Do not take this medicine with any of the following medications:  live virus vaccines This medicine may also interact with the following medications:  aminoglutethimide  amphotericin B  aspirin and aspirin-like medicines  certain antibiotics like erythromycin, clarithromycin, and troleandomycin  certain antivirals for HIV or hepatitis  certain medicines for seizures like carbamazepine, phenobarbital, phenytoin  certain medicines to treat myasthenia gravis  cholestyramine  cyclosporine  digoxin  diuretics  ephedrine  male hormones, like estrogen or progestins and birth control pills  insulin or other medicines for diabetes  isoniazid  ketoconazole  medicines that relax muscles for surgery  mifepristone  NSAIDs, medicines for pain and inflammation, like ibuprofen or naproxen  rifampin  skin tests for allergies  thalidomide  vaccines  warfarin This list may not describe all possible interactions. Give your health care provider a list of all the medicines, herbs, non-prescription drugs, or dietary supplements you use. Also tell them if you smoke, drink alcohol, or use illegal drugs. Some items may interact with your medicine. What should I watch for while using this medicine? Visit your health care professional for regular checks on your progress. Tell your health care professional if your symptoms do not start to get better or if they get worse. Your condition will be monitored carefully while you are receiving this medicine. Wear a medical ID bracelet or chain. Carry a card that describes your disease and details of your medicine and dosage times. This medicine  may increase your risk of getting an infection. Call your health care professional for advice if you get a fever, chills, or sore throat, or other symptoms of a cold or flu. Do not treat yourself. Try to avoid being around people who are sick. Call your health care professional if you are around anyone with measles, chickenpox, or if you develop sores or blisters that do not heal properly. If you are going to need surgery or other procedures, tell your doctor or health care professional that you have taken this medicine within the last 12 months. Ask your doctor or health care professional about your diet. You may need to lower the amount of salt you eat. This  medicine may increase blood sugar. Ask your healthcare provider if changes in diet or medicines are needed if you have diabetes. What side effects may I notice from receiving this medicine? Side effects that you should report to your doctor or health care professional as soon as possible:  allergic reactions like skin rash, itching or hives, swelling of the face, lips, or tongue  bloody or black, tarry stools  changes in emotions or moods  changes in vision  confusion, excitement, restlessness  depressed mood  eye pain  hallucinations  muscle weakness  severe or sudden stomach or belly pain  signs and symptoms of high blood sugar such as being more thirsty or hungry or having to urinate more than normal. You may also feel very tired or have blurry vision.  signs and symptoms of infection like fever; chills; cough; sore throat; pain or trouble passing urine  swelling of ankles, feet  unusual bruising or bleeding  wounds that do not heal Side effects that usually do not require medical attention (report to your doctor or health care professional if they continue or are bothersome):  increased appetite  increased growth of face or body hair  headache  nausea, vomiting  pain, redness, or irritation at site where  injected  skin problems, acne, thin and shiny skin  trouble sleeping  weight gain This list may not describe all possible side effects. Call your doctor for medical advice about side effects. You may report side effects to FDA at 1-800-FDA-1088. Where should I keep my medicine? This medicine is given in a hospital or clinic and will not be stored at home. NOTE: This sheet is a summary. It may not cover all possible information. If you have questions about this medicine, talk to your doctor, pharmacist, or health care provider.  2020 Elsevier/Gold Standard (2018-10-02 13:51:58) Oxaliplatin Injection What is this medicine? OXALIPLATIN (ox AL i PLA tin) is a chemotherapy drug. It targets fast dividing cells, like cancer cells, and causes these cells to die. This medicine is used to treat cancers of the colon and rectum, and many other cancers. This medicine may be used for other purposes; ask your health care provider or pharmacist if you have questions. COMMON BRAND NAME(S): Eloxatin What should I tell my health care provider before I take this medicine? They need to know if you have any of these conditions:  heart disease  history of irregular heartbeat  liver disease  low blood counts, like white cells, platelets, or red blood cells  lung or breathing disease, like asthma  take medicines that treat or prevent blood clots  tingling of the fingers or toes, or other nerve disorder  an unusual or allergic reaction to oxaliplatin, other chemotherapy, other medicines, foods, dyes, or preservatives  pregnant or trying to get pregnant  breast-feeding How should I use this medicine? This drug is given as an infusion into a vein. It is administered in a hospital or clinic by a specially trained health care professional. Talk to your pediatrician regarding the use of this medicine in children. Special care may be needed. Overdosage: If you think you have taken too much of this  medicine contact a poison control center or emergency room at once. NOTE: This medicine is only for you. Do not share this medicine with others. What if I miss a dose? It is important not to miss a dose. Call your doctor or health care professional if you are unable to keep an appointment. What may interact  with this medicine? Do not take this medicine with any of the following medications:  cisapride  dronedarone  pimozide  thioridazine This medicine may also interact with the following medications:  aspirin and aspirin-like medicines  certain medicines that treat or prevent blood clots like warfarin, apixaban, dabigatran, and rivaroxaban  cisplatin  cyclosporine  diuretics  medicines for infection like acyclovir, adefovir, amphotericin B, bacitracin, cidofovir, foscarnet, ganciclovir, gentamicin, pentamidine, vancomycin  NSAIDs, medicines for pain and inflammation, like ibuprofen or naproxen  other medicines that prolong the QT interval (an abnormal heart rhythm)  pamidronate  zoledronic acid This list may not describe all possible interactions. Give your health care provider a list of all the medicines, herbs, non-prescription drugs, or dietary supplements you use. Also tell them if you smoke, drink alcohol, or use illegal drugs. Some items may interact with your medicine. What should I watch for while using this medicine? Your condition will be monitored carefully while you are receiving this medicine. You may need blood work done while you are taking this medicine. This medicine may make you feel generally unwell. This is not uncommon as chemotherapy can affect healthy cells as well as cancer cells. Report any side effects. Continue your course of treatment even though you feel ill unless your healthcare professional tells you to stop. This medicine can make you more sensitive to cold. Do not drink cold drinks or use ice. Cover exposed skin before coming in contact with  cold temperatures or cold objects. When out in cold weather wear warm clothing and cover your mouth and nose to warm the air that goes into your lungs. Tell your doctor if you get sensitive to the cold. Do not become pregnant while taking this medicine or for 9 months after stopping it. Women should inform their health care professional if they wish to become pregnant or think they might be pregnant. Men should not father a child while taking this medicine and for 6 months after stopping it. There is potential for serious side effects to an unborn child. Talk to your health care professional for more information. Do not breast-feed a child while taking this medicine or for 3 months after stopping it. This medicine has caused ovarian failure in some women. This medicine may make it more difficult to get pregnant. Talk to your health care professional if you are concerned about your fertility. This medicine has caused decreased sperm counts in some men. This may make it more difficult to father a child. Talk to your health care professional if you are concerned about your fertility. This medicine may increase your risk of getting an infection. Call your health care professional for advice if you get a fever, chills, or sore throat, or other symptoms of a cold or flu. Do not treat yourself. Try to avoid being around people who are sick. Avoid taking medicines that contain aspirin, acetaminophen, ibuprofen, naproxen, or ketoprofen unless instructed by your health care professional. These medicines may hide a fever. Be careful brushing or flossing your teeth or using a toothpick because you may get an infection or bleed more easily. If you have any dental work done, tell your dentist you are receiving this medicine. What side effects may I notice from receiving this medicine? Side effects that you should report to your doctor or health care professional as soon as possible:  allergic reactions like skin rash,  itching or hives, swelling of the face, lips, or tongue  breathing problems  cough  low blood  counts - this medicine may decrease the number of white blood cells, red blood cells, and platelets. You may be at increased risk for infections and bleeding  nausea, vomiting  pain, redness, or irritation at site where injected  pain, tingling, numbness in the hands or feet  signs and symptoms of bleeding such as bloody or black, tarry stools; red or dark brown urine; spitting up blood or brown material that looks like coffee grounds; red spots on the skin; unusual bruising or bleeding from the eyes, gums, or nose  signs and symptoms of a dangerous change in heartbeat or heart rhythm like chest pain; dizziness; fast, irregular heartbeat; palpitations; feeling faint or lightheaded; falls  signs and symptoms of infection like fever; chills; cough; sore throat; pain or trouble passing urine  signs and symptoms of liver injury like dark yellow or brown urine; general ill feeling or flu-like symptoms; light-colored stools; loss of appetite; nausea; right upper belly pain; unusually weak or tired; yellowing of the eyes or skin  signs and symptoms of low red blood cells or anemia such as unusually weak or tired; feeling faint or lightheaded; falls  signs and symptoms of muscle injury like dark urine; trouble passing urine or change in the amount of urine; unusually weak or tired; muscle pain; back pain Side effects that usually do not require medical attention (report to your doctor or health care professional if they continue or are bothersome):  changes in taste  diarrhea  gas  hair loss  loss of appetite  mouth sores This list may not describe all possible side effects. Call your doctor for medical advice about side effects. You may report side effects to FDA at 1-800-FDA-1088. Where should I keep my medicine? This drug is given in a hospital or clinic and will not be stored at  home. NOTE: This sheet is a summary. It may not cover all possible information. If you have questions about this medicine, talk to your doctor, pharmacist, or health care provider.  2020 Elsevier/Gold Standard (2018-08-08 12:20:35)

## 2019-11-21 ENCOUNTER — Encounter: Payer: Self-pay | Admitting: *Deleted

## 2019-11-21 NOTE — Progress Notes (Signed)
This nurse faxed labs, last office note and CT scans to [phone]. Patient is getting ready to have neck surgery.

## 2019-11-22 ENCOUNTER — Inpatient Hospital Stay: Payer: Medicare PPO

## 2019-11-22 ENCOUNTER — Other Ambulatory Visit: Payer: Self-pay

## 2019-11-22 DIAGNOSIS — R109 Unspecified abdominal pain: Secondary | ICD-10-CM | POA: Diagnosis not present

## 2019-11-22 DIAGNOSIS — Z5189 Encounter for other specified aftercare: Secondary | ICD-10-CM | POA: Diagnosis not present

## 2019-11-22 DIAGNOSIS — Z9049 Acquired absence of other specified parts of digestive tract: Secondary | ICD-10-CM | POA: Diagnosis not present

## 2019-11-22 DIAGNOSIS — C169 Malignant neoplasm of stomach, unspecified: Secondary | ICD-10-CM | POA: Diagnosis not present

## 2019-11-22 DIAGNOSIS — C162 Malignant neoplasm of body of stomach: Secondary | ICD-10-CM

## 2019-11-22 DIAGNOSIS — Z5111 Encounter for antineoplastic chemotherapy: Secondary | ICD-10-CM | POA: Diagnosis not present

## 2019-11-22 DIAGNOSIS — Z79899 Other long term (current) drug therapy: Secondary | ICD-10-CM | POA: Diagnosis not present

## 2019-11-22 DIAGNOSIS — R202 Paresthesia of skin: Secondary | ICD-10-CM | POA: Diagnosis not present

## 2019-11-22 DIAGNOSIS — Z7984 Long term (current) use of oral hypoglycemic drugs: Secondary | ICD-10-CM | POA: Diagnosis not present

## 2019-11-22 MED ORDER — SODIUM CHLORIDE 0.9% FLUSH
10.0000 mL | INTRAVENOUS | Status: DC | PRN
  Administered 2019-11-22: 10 mL
  Filled 2019-11-22: qty 10

## 2019-11-22 MED ORDER — PEGFILGRASTIM-CBQV 6 MG/0.6ML ~~LOC~~ SOSY
PREFILLED_SYRINGE | SUBCUTANEOUS | Status: AC
  Filled 2019-11-22: qty 0.6

## 2019-11-22 MED ORDER — HEPARIN SOD (PORK) LOCK FLUSH 100 UNIT/ML IV SOLN
500.0000 [IU] | Freq: Once | INTRAVENOUS | Status: AC | PRN
  Administered 2019-11-22: 500 [IU]
  Filled 2019-11-22: qty 5

## 2019-11-22 MED ORDER — PEGFILGRASTIM-CBQV 6 MG/0.6ML ~~LOC~~ SOSY
6.0000 mg | PREFILLED_SYRINGE | Freq: Once | SUBCUTANEOUS | Status: AC
  Administered 2019-11-22: 6 mg via SUBCUTANEOUS

## 2019-11-22 NOTE — Progress Notes (Signed)
Pt requested scan results. Discussed with MD, informed pt per MD, CT scan looks good. Pt is on his way for neck surgery. Pt verbalized understanding. No concerns at this time. Calendar printed. Pt discharged.

## 2019-11-27 ENCOUNTER — Encounter: Payer: Self-pay | Admitting: Internal Medicine

## 2019-11-27 ENCOUNTER — Other Ambulatory Visit: Payer: Self-pay | Admitting: Orthopedic Surgery

## 2019-12-04 DIAGNOSIS — E440 Moderate protein-calorie malnutrition: Secondary | ICD-10-CM | POA: Diagnosis not present

## 2019-12-04 DIAGNOSIS — C169 Malignant neoplasm of stomach, unspecified: Secondary | ICD-10-CM | POA: Diagnosis not present

## 2019-12-10 DIAGNOSIS — C162 Malignant neoplasm of body of stomach: Secondary | ICD-10-CM | POA: Diagnosis not present

## 2019-12-11 ENCOUNTER — Other Ambulatory Visit: Payer: Self-pay

## 2019-12-11 ENCOUNTER — Inpatient Hospital Stay: Payer: Medicare PPO

## 2019-12-11 ENCOUNTER — Encounter: Payer: Self-pay | Admitting: Hematology & Oncology

## 2019-12-11 ENCOUNTER — Inpatient Hospital Stay: Payer: Medicare PPO | Attending: Hematology & Oncology | Admitting: Hematology & Oncology

## 2019-12-11 VITALS — BP 118/76 | HR 70 | Temp 98.5°F | Resp 18 | Wt 184.0 lb

## 2019-12-11 DIAGNOSIS — R109 Unspecified abdominal pain: Secondary | ICD-10-CM | POA: Insufficient documentation

## 2019-12-11 DIAGNOSIS — Z5189 Encounter for other specified aftercare: Secondary | ICD-10-CM | POA: Diagnosis not present

## 2019-12-11 DIAGNOSIS — Z79899 Other long term (current) drug therapy: Secondary | ICD-10-CM | POA: Insufficient documentation

## 2019-12-11 DIAGNOSIS — C169 Malignant neoplasm of stomach, unspecified: Secondary | ICD-10-CM | POA: Insufficient documentation

## 2019-12-11 DIAGNOSIS — C162 Malignant neoplasm of body of stomach: Secondary | ICD-10-CM

## 2019-12-11 DIAGNOSIS — Z903 Acquired absence of stomach [part of]: Secondary | ICD-10-CM | POA: Diagnosis not present

## 2019-12-11 DIAGNOSIS — Z5111 Encounter for antineoplastic chemotherapy: Secondary | ICD-10-CM | POA: Diagnosis not present

## 2019-12-11 LAB — CBC WITH DIFFERENTIAL (CANCER CENTER ONLY)
Abs Immature Granulocytes: 0.01 10*3/uL (ref 0.00–0.07)
Basophils Absolute: 0 10*3/uL (ref 0.0–0.1)
Basophils Relative: 0 %
Eosinophils Absolute: 0.1 10*3/uL (ref 0.0–0.5)
Eosinophils Relative: 2 %
HCT: 35.1 % — ABNORMAL LOW (ref 39.0–52.0)
Hemoglobin: 11.5 g/dL — ABNORMAL LOW (ref 13.0–17.0)
Immature Granulocytes: 0 %
Lymphocytes Relative: 40 %
Lymphs Abs: 1.1 10*3/uL (ref 0.7–4.0)
MCH: 29 pg (ref 26.0–34.0)
MCHC: 32.8 g/dL (ref 30.0–36.0)
MCV: 88.6 fL (ref 80.0–100.0)
Monocytes Absolute: 0.5 10*3/uL (ref 0.1–1.0)
Monocytes Relative: 18 %
Neutro Abs: 1.2 10*3/uL — ABNORMAL LOW (ref 1.7–7.7)
Neutrophils Relative %: 40 %
Platelet Count: 203 10*3/uL (ref 150–400)
RBC: 3.96 MIL/uL — ABNORMAL LOW (ref 4.22–5.81)
RDW: 13.2 % (ref 11.5–15.5)
WBC Count: 2.9 10*3/uL — ABNORMAL LOW (ref 4.0–10.5)
nRBC: 0 % (ref 0.0–0.2)

## 2019-12-11 LAB — CMP (CANCER CENTER ONLY)
ALT: 15 U/L (ref 0–44)
AST: 21 U/L (ref 15–41)
Albumin: 3.7 g/dL (ref 3.5–5.0)
Alkaline Phosphatase: 58 U/L (ref 38–126)
Anion gap: 5 (ref 5–15)
BUN: 13 mg/dL (ref 8–23)
CO2: 29 mmol/L (ref 22–32)
Calcium: 9.4 mg/dL (ref 8.9–10.3)
Chloride: 107 mmol/L (ref 98–111)
Creatinine: 0.83 mg/dL (ref 0.61–1.24)
GFR, Est AFR Am: >60 mL/min (ref >60)
GFR, Estimated: >60 mL/min (ref >60)
Glucose, Bld: 105 mg/dL — ABNORMAL HIGH (ref 70–99)
Potassium: 4 mmol/L (ref 3.5–5.1)
Sodium: 141 mmol/L (ref 135–145)
Total Bilirubin: 0.4 mg/dL (ref 0.3–1.2)
Total Protein: 6.4 g/dL — ABNORMAL LOW (ref 6.5–8.1)

## 2019-12-11 LAB — LACTATE DEHYDROGENASE: LDH: 155 U/L (ref 98–192)

## 2019-12-11 MED ORDER — SODIUM CHLORIDE 0.9 % IV SOLN
1920.0000 mg/m2 | INTRAVENOUS | Status: DC
  Administered 2019-12-11: 3400 mg via INTRAVENOUS
  Filled 2019-12-11: qty 68

## 2019-12-11 MED ORDER — LEUCOVORIN CALCIUM INJECTION 350 MG
400.0000 mg/m2 | Freq: Once | INTRAVENOUS | Status: AC
  Administered 2019-12-11: 712 mg via INTRAVENOUS
  Filled 2019-12-11: qty 35.6

## 2019-12-11 MED ORDER — DEXTROSE 5 % IV SOLN
Freq: Once | INTRAVENOUS | Status: AC
  Filled 2019-12-11: qty 250

## 2019-12-11 MED ORDER — SODIUM CHLORIDE 0.9 % IV SOLN
10.0000 mg | Freq: Once | INTRAVENOUS | Status: AC
  Administered 2019-12-11: 10 mg via INTRAVENOUS
  Filled 2019-12-11: qty 10

## 2019-12-11 MED ORDER — OXALIPLATIN CHEMO INJECTION 100 MG/20ML
120.0000 mg | Freq: Once | INTRAVENOUS | Status: AC
  Administered 2019-12-11: 120 mg via INTRAVENOUS
  Filled 2019-12-11: qty 20

## 2019-12-11 MED ORDER — PALONOSETRON HCL INJECTION 0.25 MG/5ML
INTRAVENOUS | Status: AC
  Filled 2019-12-11: qty 5

## 2019-12-11 MED ORDER — PALONOSETRON HCL INJECTION 0.25 MG/5ML
0.2500 mg | Freq: Once | INTRAVENOUS | Status: AC
  Administered 2019-12-11: 0.25 mg via INTRAVENOUS

## 2019-12-11 NOTE — Progress Notes (Signed)
Hematology and Oncology Follow Up Visit  Jonathan Lewis [id number] Jul 12, 1940 79 y.o. 12/11/2019   Principle Diagnosis:  Stage IIIA (T3N2M0) adenocarcinoma of the stomach -- S/p distal gastrectomy on 03/19/2019 -- HER2-/PD-L1+  Current Therapy: Adjuvant FOLFOX -started on 05/13/2019, s/p cycle#7   Interim History:  Jonathan Lewis is here today for follow-up and treatment.  Thankfully, his CT scan that he had did not show any evidence of obvious cancer.  Very happy about that.  This will be his last cycle of chemotherapy.  After this, he will need to have neck surgery.  Of note, his tumor is positive for PD-L1.  As such, I think in the future if we needed to we could always use immunotherapy with chemotherapy for any recurrence.  He has had no abdominal pain.  He had a good appetite.  He has gained weight.  He had a good Labor Day weekend.  There is been no bleeding.  He has had no diarrhea.  He has had no leg swelling.  Overall, his performance status is ECOG 1.    Medications:  Allergies as of 12/11/2019   No Known Allergies     Medication List       Accurate as of December 11, 2019 11:37 AM. If you have any questions, ask your nurse or doctor.        acetaminophen 325 MG tablet Commonly known as: Tylenol Take 2 tablets (650 mg total) by mouth every 6 (six) hours as needed for mild pain.   dexamethasone 4 MG tablet Commonly known as: DECADRON Take 2 tablets (8 mg total) by mouth daily. Start day after chemotherapy for two days total.   docusate sodium 100 MG capsule Commonly known as: COLACE Take 1 capsule (100 mg total) by mouth daily.   famotidine 20 MG tablet Commonly known as: PEPCID Take 1 tablet (20 mg total) by mouth daily.   gabapentin 100 MG capsule Commonly known as: NEURONTIN Take 1 capsule (100 mg total) by mouth every 12 (twelve) hours.   gabapentin 250 MG/5ML solution Commonly known as: NEURONTIN Take by mouth.   latanoprost 0.005 %  ophthalmic solution Commonly known as: XALATAN Place 1 drop into both eyes at bedtime.   lidocaine-prilocaine cream Commonly known as: EMLA Apply 1 application topically as needed. Place on the port one hour before appointment.   lisinopril 10 MG tablet Commonly known as: ZESTRIL   LORazepam 1 MG tablet Commonly known as: ATIVAN Take 1 tablet (1 mg total) by mouth every 6 (six) hours as needed for anxiety.   LORazepam 1 MG tablet Commonly known as: ATIVAN SMARTSIG:By Mouth   metFORMIN 500 MG 24 hr tablet Commonly known as: GLUCOPHAGE-XR Take 500 mg by mouth every morning.   ondansetron 8 MG tablet Commonly known as: ZOFRAN Take one tablet by mouth every 12 hours as needed for nausea/vomiting. Start 3 days after chemotherapy.   pantoprazole 40 MG tablet Commonly known as: PROTONIX   Systane Ultra 0.4-0.3 % Soln Generic drug: Polyethyl Glycol-Propyl Glycol Apply 1 drop to eye 3 (three) times daily as needed (dry eyes).   terazosin 2 MG capsule Commonly known as: HYTRIN       Allergies: No Known Allergies  Past Medical History, Surgical history, Social history, and Family History were reviewed and updated.  Review of Systems: Review of Systems  Constitutional: Negative.   HENT: Negative.   Eyes: Negative.   Respiratory: Negative.   Cardiovascular: Negative.   Gastrointestinal: Positive for abdominal pain.  Genitourinary: Negative.   Musculoskeletal: Negative.   Skin: Negative.   Neurological: Positive for tingling.  Endo/Heme/Allergies: Negative.   Psychiatric/Behavioral: Negative.      Physical Exam:  weight is 184 lb (83.5 kg). His oral temperature is 98.5 F (36.9 C). His blood pressure is 118/76 and his pulse is 70. His respiration is 18 and oxygen saturation is 98%.   Wt Readings from Last 3 Encounters:  12/11/19 184 lb (83.5 kg)  11/20/19 183 lb 4 oz (83.1 kg)  10/30/19 186 lb (84.4 kg)      Physical Activity: Inactive  . Days of Exercise  per Week: 0 days  . Minutes of Exercise per Session: 0 min    Physical Exam Vitals reviewed.  HENT:     Head: Normocephalic and atraumatic.  Eyes:     Pupils: Pupils are equal, round, and reactive to light.  Cardiovascular:     Rate and Rhythm: Normal rate and regular rhythm.     Heart sounds: Normal heart sounds.  Pulmonary:     Effort: Pulmonary effort is normal.     Breath sounds: Normal breath sounds.  Abdominal:     General: Bowel sounds are normal.     Palpations: Abdomen is soft.     Comments: Abdominal exam is soft.  He has well-healed laparotomy scars.  There is no tenderness to palpation.  He has no guarding or rebound tenderness.  There is no fluid wave.  There is no palpable liver or spleen tip.  Musculoskeletal:        General: No tenderness or deformity. Normal range of motion.     Cervical back: Normal range of motion.  Lymphadenopathy:     Cervical: No cervical adenopathy.  Skin:    General: Skin is warm and dry.     Findings: No erythema or rash.  Neurological:     Mental Status: He is alert and oriented to person, place, and time.  Psychiatric:        Behavior: Behavior normal.        Thought Content: Thought content normal.        Judgment: Judgment normal.     Lab Results  Component Value Date   WBC 2.9 (L) 12/11/2019   HGB 11.5 (L) 12/11/2019   HCT 35.1 (L) 12/11/2019   MCV 88.6 12/11/2019   PLT 203 12/11/2019   Lab Results  Component Value Date   FERRITIN 37 09/25/2019   IRON 98 09/25/2019   TIBC 328 09/25/2019   UIBC 230 09/25/2019   IRONPCTSAT 30 09/25/2019   Lab Results  Component Value Date   RBC 3.96 (L) 12/11/2019   No results found for: KPAFRELGTCHN, LAMBDASER, KAPLAMBRATIO No results found for: IGGSERUM, IGA, IGMSERUM No results found for: Ronnald Ramp, A1GS, A2GS, Violet Baldy, MSPIKE, SPEI   Chemistry      Component Value Date/Time   NA 141 12/11/2019 1000   K 4.0 12/11/2019 1000   CL 107 12/11/2019  1000   CO2 29 12/11/2019 1000   BUN 13 12/11/2019 1000   CREATININE 0.83 12/11/2019 1000   CREATININE 1.06 07/16/2012 1038      Component Value Date/Time   CALCIUM 9.4 12/11/2019 1000   ALKPHOS 58 12/11/2019 1000   AST 21 12/11/2019 1000   ALT 15 12/11/2019 1000   BILITOT 0.4 12/11/2019 1000       Impression and Plan: Jonathan Lewis is a very pleasant 79 yo African American gentleman with stage IIIA (T3N2M0) adenocarcinoma of  the stomach, HER2-/PD-L1+. He had a distal gastrectomy on 12/15/2020andis currently receiving FOLFOX.   We will go ahead with his eighth and final cycle of chemotherapy.  He will get Neulasta after this cycle of treatment.  His surgery scheduled for October 6, I think.  I would like to see him back about a week before surgery so I can make sure his blood counts are okay.   Volanda Napoleon, MD 9/8/202111:37 AM

## 2019-12-11 NOTE — Patient Instructions (Signed)
[facility] Discharge Instructions for Patients Receiving Chemotherapy  Today you received the following chemotherapy agents 5FU, Oxaliplatin, Leucovorin  To help prevent nausea and vomiting after your treatment, we encourage you to take your nausea medication    If you develop nausea and vomiting that is not controlled by your nausea medication, call the clinic.   BELOW ARE SYMPTOMS THAT SHOULD BE REPORTED IMMEDIATELY:  *FEVER GREATER THAN 100.5 F  *CHILLS WITH OR WITHOUT FEVER  NAUSEA AND VOMITING THAT IS NOT CONTROLLED WITH YOUR NAUSEA MEDICATION  *UNUSUAL SHORTNESS OF BREATH  *UNUSUAL BRUISING OR BLEEDING  TENDERNESS IN MOUTH AND THROAT WITH OR WITHOUT PRESENCE OF ULCERS  *URINARY PROBLEMS  *BOWEL PROBLEMS  UNUSUAL RASH Items with * indicate a potential emergency and should be followed up as soon as possible.  Feel free to call the clinic should you have any questions or concerns. The clinic phone number is [phone].  Please show the CHEMO ALERT CARD at check-in to the Emergency Department and triage nurse.   

## 2019-12-11 NOTE — Patient Instructions (Signed)

## 2019-12-13 ENCOUNTER — Other Ambulatory Visit: Payer: Self-pay

## 2019-12-13 ENCOUNTER — Inpatient Hospital Stay: Payer: Medicare PPO

## 2019-12-13 VITALS — BP 108/78 | HR 80 | Temp 98.0°F | Resp 18

## 2019-12-13 DIAGNOSIS — C162 Malignant neoplasm of body of stomach: Secondary | ICD-10-CM

## 2019-12-13 DIAGNOSIS — Z903 Acquired absence of stomach [part of]: Secondary | ICD-10-CM | POA: Diagnosis not present

## 2019-12-13 DIAGNOSIS — Z5111 Encounter for antineoplastic chemotherapy: Secondary | ICD-10-CM | POA: Diagnosis not present

## 2019-12-13 DIAGNOSIS — Z79899 Other long term (current) drug therapy: Secondary | ICD-10-CM | POA: Diagnosis not present

## 2019-12-13 DIAGNOSIS — Z5189 Encounter for other specified aftercare: Secondary | ICD-10-CM | POA: Diagnosis not present

## 2019-12-13 DIAGNOSIS — C169 Malignant neoplasm of stomach, unspecified: Secondary | ICD-10-CM | POA: Diagnosis not present

## 2019-12-13 DIAGNOSIS — K921 Melena: Secondary | ICD-10-CM

## 2019-12-13 DIAGNOSIS — E440 Moderate protein-calorie malnutrition: Secondary | ICD-10-CM

## 2019-12-13 DIAGNOSIS — R109 Unspecified abdominal pain: Secondary | ICD-10-CM | POA: Diagnosis not present

## 2019-12-13 DIAGNOSIS — D509 Iron deficiency anemia, unspecified: Secondary | ICD-10-CM

## 2019-12-13 MED ORDER — SODIUM CHLORIDE 0.9% FLUSH
10.0000 mL | INTRAVENOUS | Status: DC | PRN
  Administered 2019-12-13: 10 mL
  Filled 2019-12-13: qty 10

## 2019-12-13 MED ORDER — PEGFILGRASTIM-CBQV 6 MG/0.6ML ~~LOC~~ SOSY
6.0000 mg | PREFILLED_SYRINGE | Freq: Once | SUBCUTANEOUS | Status: AC
  Administered 2019-12-13: 6 mg via SUBCUTANEOUS

## 2019-12-13 MED ORDER — PEGFILGRASTIM-CBQV 6 MG/0.6ML ~~LOC~~ SOSY
PREFILLED_SYRINGE | SUBCUTANEOUS | Status: AC
  Filled 2019-12-13: qty 0.6

## 2019-12-13 MED ORDER — HEPARIN SOD (PORK) LOCK FLUSH 100 UNIT/ML IV SOLN
500.0000 [IU] | Freq: Once | INTRAVENOUS | Status: AC | PRN
  Administered 2019-12-13: 500 [IU]
  Filled 2019-12-13: qty 5

## 2019-12-13 NOTE — Patient Instructions (Signed)

## 2019-12-13 NOTE — Patient Instructions (Signed)

## 2019-12-19 ENCOUNTER — Other Ambulatory Visit: Payer: Self-pay | Admitting: *Deleted

## 2019-12-19 DIAGNOSIS — C162 Malignant neoplasm of body of stomach: Secondary | ICD-10-CM

## 2019-12-19 MED ORDER — FAMOTIDINE 20 MG PO TABS: 20.0000 mg | ORAL_TABLET | Freq: Every day | ORAL | 2 refills | Status: DC

## 2019-12-31 ENCOUNTER — Other Ambulatory Visit: Payer: Self-pay

## 2019-12-31 ENCOUNTER — Inpatient Hospital Stay: Payer: Medicare PPO

## 2019-12-31 ENCOUNTER — Encounter: Payer: Self-pay | Admitting: Hematology & Oncology

## 2019-12-31 ENCOUNTER — Inpatient Hospital Stay (HOSPITAL_BASED_OUTPATIENT_CLINIC_OR_DEPARTMENT_OTHER): Payer: Medicare PPO | Admitting: Hematology & Oncology

## 2019-12-31 ENCOUNTER — Telehealth: Payer: Self-pay | Admitting: Medical

## 2019-12-31 VITALS — BP 117/80 | HR 68 | Temp 98.2°F | Resp 17 | Wt 184.0 lb

## 2019-12-31 VITALS — BP 117/80 | HR 68 | Temp 98.2°F | Resp 17

## 2019-12-31 DIAGNOSIS — C162 Malignant neoplasm of body of stomach: Secondary | ICD-10-CM | POA: Diagnosis not present

## 2019-12-31 DIAGNOSIS — R109 Unspecified abdominal pain: Secondary | ICD-10-CM | POA: Diagnosis not present

## 2019-12-31 DIAGNOSIS — Z79899 Other long term (current) drug therapy: Secondary | ICD-10-CM | POA: Diagnosis not present

## 2019-12-31 DIAGNOSIS — Z5189 Encounter for other specified aftercare: Secondary | ICD-10-CM | POA: Diagnosis not present

## 2019-12-31 DIAGNOSIS — Z903 Acquired absence of stomach [part of]: Secondary | ICD-10-CM | POA: Diagnosis not present

## 2019-12-31 DIAGNOSIS — C169 Malignant neoplasm of stomach, unspecified: Secondary | ICD-10-CM | POA: Diagnosis not present

## 2019-12-31 DIAGNOSIS — Z95828 Presence of other vascular implants and grafts: Secondary | ICD-10-CM

## 2019-12-31 DIAGNOSIS — Z5111 Encounter for antineoplastic chemotherapy: Secondary | ICD-10-CM | POA: Diagnosis not present

## 2019-12-31 LAB — CMP (CANCER CENTER ONLY)
ALT: 16 U/L (ref 0–44)
AST: 21 U/L (ref 15–41)
Albumin: 3.8 g/dL (ref 3.5–5.0)
Alkaline Phosphatase: 63 U/L (ref 38–126)
Anion gap: 6 (ref 5–15)
BUN: 16 mg/dL (ref 8–23)
CO2: 28 mmol/L (ref 22–32)
Calcium: 9.4 mg/dL (ref 8.9–10.3)
Chloride: 106 mmol/L (ref 98–111)
Creatinine: 0.87 mg/dL (ref 0.61–1.24)
GFR, Est AFR Am: >60 mL/min (ref >60)
GFR, Estimated: >60 mL/min (ref >60)
Glucose, Bld: 127 mg/dL — ABNORMAL HIGH (ref 70–99)
Potassium: 4 mmol/L (ref 3.5–5.1)
Sodium: 140 mmol/L (ref 135–145)
Total Bilirubin: 0.4 mg/dL (ref 0.3–1.2)
Total Protein: 6.4 g/dL — ABNORMAL LOW (ref 6.5–8.1)

## 2019-12-31 LAB — CBC WITH DIFFERENTIAL (CANCER CENTER ONLY)
Abs Immature Granulocytes: 0.01 10*3/uL (ref 0.00–0.07)
Basophils Absolute: 0 10*3/uL (ref 0.0–0.1)
Basophils Relative: 0 %
Eosinophils Absolute: 0 10*3/uL (ref 0.0–0.5)
Eosinophils Relative: 2 %
HCT: 35.6 % — ABNORMAL LOW (ref 39.0–52.0)
Hemoglobin: 11.5 g/dL — ABNORMAL LOW (ref 13.0–17.0)
Immature Granulocytes: 0 %
Lymphocytes Relative: 36 %
Lymphs Abs: 1 10*3/uL (ref 0.7–4.0)
MCH: 28.8 pg (ref 26.0–34.0)
MCHC: 32.3 g/dL (ref 30.0–36.0)
MCV: 89.2 fL (ref 80.0–100.0)
Monocytes Absolute: 0.5 10*3/uL (ref 0.1–1.0)
Monocytes Relative: 18 %
Neutro Abs: 1.2 10*3/uL — ABNORMAL LOW (ref 1.7–7.7)
Neutrophils Relative %: 44 %
Platelet Count: 166 10*3/uL (ref 150–400)
RBC: 3.99 MIL/uL — ABNORMAL LOW (ref 4.22–5.81)
RDW: 13.5 % (ref 11.5–15.5)
WBC Count: 2.7 10*3/uL — ABNORMAL LOW (ref 4.0–10.5)
nRBC: 0 % (ref 0.0–0.2)

## 2019-12-31 MED ORDER — SODIUM CHLORIDE 0.9% FLUSH
10.0000 mL | Freq: Once | INTRAVENOUS | Status: AC
  Administered 2019-12-31: 10 mL via INTRAVENOUS
  Filled 2019-12-31: qty 10

## 2019-12-31 MED ORDER — HEPARIN SOD (PORK) LOCK FLUSH 100 UNIT/ML IV SOLN
500.0000 [IU] | Freq: Once | INTRAVENOUS | Status: AC
  Administered 2019-12-31: 500 [IU] via INTRAVENOUS
  Filled 2019-12-31: qty 5

## 2019-12-31 NOTE — Telephone Encounter (Signed)
NO los 9/28.  Dr Marin Olp did verbally advise when he would like to see patient back in the office after his upcoming surgery on 10/6

## 2019-12-31 NOTE — Progress Notes (Signed)
Hematology and Oncology Follow Up Visit  Jonathan Lewis [id number] 03/27/1941 79 y.o. 12/31/2019   Principle Diagnosis:  Stage IIIA (T3N2M0) adenocarcinoma of the stomach -- S/p distal gastrectomy on 03/19/2019 -- HER2-/PD-L1+  Current Therapy: Adjuvant FOLFOX -started on 05/13/2019, s/p cycle#8 -completed on Dec 11, 2019   Interim History:  Jonathan Lewis is here today for follow-up.  He finished up his chemotherapy.  He did well with chemotherapy.  Now, he'll have neck surgery.  He will have this on Oct 06.  He is feeling okay.  He is eating well.  He is having no problems with nausea or vomiting.  He has had no issues with fever.  He has had no bleeding.  He has had no problems with diarrhea.  He may have little bit of urinary urgency or frequency.  I suspect he probably does have all BPH.  He probably needs to be back on Flomax.  He has had no cough or shortness of breath.  There is been no rashes.  He has had no headache.  Overall, his performance status is ECOG 1    Medications:  Allergies as of 12/31/2019   No Known Allergies     Medication List       Accurate as of December 31, 2019 11:51 AM. If you have any questions, ask your nurse or doctor.        STOP taking these medications   dexamethasone 4 MG tablet Commonly known as: DECADRON Stopped by: Volanda Napoleon, MD   ondansetron 8 MG tablet Commonly known as: ZOFRAN Stopped by: Volanda Napoleon, MD     TAKE these medications   acetaminophen 325 MG tablet Commonly known as: Tylenol Take 2 tablets (650 mg total) by mouth every 6 (six) hours as needed for mild pain.   docusate sodium 100 MG capsule Commonly known as: COLACE Take 1 capsule (100 mg total) by mouth daily.   famotidine 20 MG tablet Commonly known as: PEPCID Take 1 tablet (20 mg total) by mouth daily. What changed: when to take this   gabapentin 100 MG capsule Commonly known as: NEURONTIN Take 1 capsule (100 mg total) by mouth  every 12 (twelve) hours. What changed:   when to take this  reasons to take this   latanoprost 0.005 % ophthalmic solution Commonly known as: XALATAN Place 1 drop into both eyes at bedtime.   lidocaine-prilocaine cream Commonly known as: EMLA Apply 1 application topically as needed. Place on the port one hour before appointment.   multivitamin with minerals Tabs tablet Take 1 tablet by mouth daily.   Systane Ultra 0.4-0.3 % Soln Generic drug: Polyethyl Glycol-Propyl Glycol Apply 1 drop to eye 3 (three) times daily as needed (dry eyes).       Allergies: No Known Allergies  Past Medical History, Surgical history, Social history, and Family History were reviewed and updated.  Review of Systems: Review of Systems  Constitutional: Negative.   HENT: Negative.   Eyes: Negative.   Respiratory: Negative.   Cardiovascular: Negative.   Gastrointestinal: Positive for abdominal pain.  Genitourinary: Negative.   Musculoskeletal: Negative.   Skin: Negative.   Neurological: Positive for tingling.  Endo/Heme/Allergies: Negative.   Psychiatric/Behavioral: Negative.      Physical Exam:  weight is 184 lb (83.5 kg). His oral temperature is 98.2 F (36.8 C). His blood pressure is 117/80 and his pulse is 68. His respiration is 17 and oxygen saturation is 100%.   Wt Readings from Last 3  Encounters:  12/31/19 184 lb (83.5 kg)  12/11/19 184 lb (83.5 kg)  11/20/19 183 lb 4 oz (83.1 kg)      Physical Activity: Inactive  . Days of Exercise per Week: 0 days  . Minutes of Exercise per Session: 0 min    Physical Exam Vitals reviewed.  HENT:     Head: Normocephalic and atraumatic.  Eyes:     Pupils: Pupils are equal, round, and reactive to light.  Cardiovascular:     Rate and Rhythm: Normal rate and regular rhythm.     Heart sounds: Normal heart sounds.  Pulmonary:     Effort: Pulmonary effort is normal.     Breath sounds: Normal breath sounds.  Abdominal:     General: Bowel  sounds are normal.     Palpations: Abdomen is soft.     Comments: Abdominal exam is soft.  He has well-healed laparotomy scars.  There is no tenderness to palpation.  He has no guarding or rebound tenderness.  There is no fluid wave.  There is no palpable liver or spleen tip.  Musculoskeletal:        General: No tenderness or deformity. Normal range of motion.     Cervical back: Normal range of motion.  Lymphadenopathy:     Cervical: No cervical adenopathy.  Skin:    General: Skin is warm and dry.     Findings: No erythema or rash.  Neurological:     Mental Status: He is alert and oriented to person, place, and time.  Psychiatric:        Behavior: Behavior normal.        Thought Content: Thought content normal.        Judgment: Judgment normal.     Lab Results  Component Value Date   WBC 2.7 (L) 12/31/2019   HGB 11.5 (L) 12/31/2019   HCT 35.6 (L) 12/31/2019   MCV 89.2 12/31/2019   PLT 166 12/31/2019   Lab Results  Component Value Date   FERRITIN 37 09/25/2019   IRON 98 09/25/2019   TIBC 328 09/25/2019   UIBC 230 09/25/2019   IRONPCTSAT 30 09/25/2019   Lab Results  Component Value Date   RBC 3.99 (L) 12/31/2019   No results found for: KPAFRELGTCHN, LAMBDASER, KAPLAMBRATIO No results found for: IGGSERUM, IGA, IGMSERUM No results found for: Ronnald Ramp, A1GS, Nelida Meuse, SPEI   Chemistry      Component Value Date/Time   NA 140 12/31/2019 1056   K 4.0 12/31/2019 1056   CL 106 12/31/2019 1056   CO2 28 12/31/2019 1056   BUN 16 12/31/2019 1056   CREATININE 0.87 12/31/2019 1056   CREATININE 1.06 07/16/2012 1038      Component Value Date/Time   CALCIUM 9.4 12/31/2019 1056   ALKPHOS 63 12/31/2019 1056   AST 21 12/31/2019 1056   ALT 16 12/31/2019 1056   BILITOT 0.4 12/31/2019 1056       Impression and Plan: Jonathan Lewis is a very pleasant 79 yo African American gentleman with stage IIIA (T3N2M0) adenocarcinoma of the stomach,  HER2-/PD-L1+. He had a distal gastrectomy on 12/15/2020andis currently receiving FOLFOX.   We will go ahead and plan for follow-up scans sometime in November.  I want to make sure that he gets through his neck surgery without any problems.  I don't want him having issues coming into our office and having difficulty getting here.  I don't see any problems with him having neck surgery.  His white cell count is a little bit on the low side but I do think it is low enough that should cause any problems with infections.  His platelet count is okay.  Mr. Tuohy has gone through so much.  He really has come a long way since we 1st saw him early in the winter.    Volanda Napoleon, MD 9/28/202111:51 AM

## 2020-01-01 LAB — CEA (IN HOUSE-CHCC): CEA (CHCC-In House): 1.4 ng/mL (ref 0.00–5.00)

## 2020-01-03 DIAGNOSIS — C169 Malignant neoplasm of stomach, unspecified: Secondary | ICD-10-CM | POA: Diagnosis not present

## 2020-01-03 DIAGNOSIS — E440 Moderate protein-calorie malnutrition: Secondary | ICD-10-CM | POA: Diagnosis not present

## 2020-01-03 NOTE — Pre-Procedure Instructions (Signed)
Jonathan Lewis  01/03/2020      [id number] [address], [place] [address] Phone: [phone] Fax: [phone]  [place], [place] [address] [address] Phone: [phone] Fax: [phone]    Your procedure is scheduled on Oct. 6  Report to [facility] at 6:30 A.M.  Call this number if you have problems the morning of surgery:  [phone]   Remember:  Do not eat  after midnight.   You may drink clear liquids until 5:30 A.M .  Clear liquids allowed are:                    Water, Juice (non-citric and without pulp - diabetics please choose diet or no sugar options), Carbonated beverages - (diabetics please choose diet or no sugar options), Clear Tea, Black Coffee only (no creamer, milk or cream including half and half), Plain Jell-O only (diabetics please choose diet or no sugar options) and Gatorade (diabetics please choose diet or no sugar options)    Take these medicines the morning of surgery with A SIP OF WATER :              Docusate sodium (colace)                          If needed: tylenol, gabapentin, eye drops              7 days prior to surgery STOP taking any Aspirin (unless otherwise instructed by your surgeon), Aleve, Naproxen, Ibuprofen, Motrin, Advil, Goody's, BC's, all herbal medications, fish oil, and all vitamins.    Do not wear jewelry.  Do not wear lotions, powders, or perfumes, or deodorant.  Do not shave 48 hours prior to surgery.  Men may shave face and neck.  Do not bring valuables to the hospital.  [facility] is not responsible for any belongings or valuables.  Contacts, dentures or bridgework may not be worn into surgery.  Leave your suitcase in the car.  After surgery it may be brought to your room.  For patients admitted to the hospital, discharge time will  be determined by your treatment team.  Patients discharged the day of surgery will not be allowed to drive home.    Special instructions:   [facility]- Preparing For Surgery  Before surgery, you can play an important role. Because skin is not sterile, your skin needs to be as free of germs as possible. You can reduce the number of germs on your skin by washing with CHG (chlorahexidine gluconate) Soap before surgery.  CHG is an antiseptic cleaner which kills germs and bonds with the skin to continue killing germs even after washing.    Oral Hygiene is also important to reduce your risk of infection.  Remember - BRUSH YOUR TEETH THE MORNING OF SURGERY WITH YOUR REGULAR TOOTHPASTE  Please do not use if you have an allergy to CHG or antibacterial soaps. If your skin becomes reddened/irritated stop using the CHG.  Do not shave (including legs and underarms) for at least 48 hours prior to first CHG shower. It is OK to shave your face.  Please follow these instructions carefully.   1. Shower the NIGHT BEFORE SURGERY and the MORNING OF SURGERY with CHG.   2. If you chose  to wash your hair, wash your hair first as usual with your normal shampoo.  3. After you shampoo, rinse your hair and body thoroughly to remove the shampoo.  4. Use CHG as you would any other liquid soap. You can apply CHG directly to the skin and wash gently with a scrungie or a clean washcloth.   5. Apply the CHG Soap to your body ONLY FROM THE NECK DOWN.  Do not use on open wounds or open sores. Avoid contact with your eyes, ears, mouth and genitals (private parts). Wash Face and genitals (private parts)  with your normal soap.  6. Wash thoroughly, paying special attention to the area where your surgery will be performed.  7. Thoroughly rinse your body with warm water from the neck down.  8. DO NOT shower/wash with your normal soap after using and rinsing off the CHG Soap.  9. Pat yourself dry with a CLEAN  TOWEL.  10. Wear CLEAN PAJAMAS to bed the night before surgery, wear comfortable clothes the morning of surgery  11. Place CLEAN SHEETS on your bed the night of your first shower and DO NOT SLEEP WITH PETS.    Day of Surgery:  Do not apply any deodorants/lotions.  Please wear clean clothes to the hospital/surgery center.   Remember to brush your teeth WITH YOUR REGULAR TOOTHPASTE.    Please read over the following fact sheets that you were given.

## 2020-01-06 ENCOUNTER — Encounter (HOSPITAL_COMMUNITY): Payer: Self-pay

## 2020-01-06 ENCOUNTER — Other Ambulatory Visit (HOSPITAL_COMMUNITY)
Admission: RE | Admit: 2020-01-06 | Discharge: 2020-01-06 | Disposition: A | Discharge status: Home / Self Care | Payer: Medicare PPO | Source: Ambulatory Visit | Attending: Orthopedic Surgery | Admitting: Orthopedic Surgery

## 2020-01-06 ENCOUNTER — Other Ambulatory Visit: Payer: Self-pay

## 2020-01-06 ENCOUNTER — Encounter (HOSPITAL_COMMUNITY)
Admission: RE | Admit: 2020-01-06 | Discharge: 2020-01-06 | Disposition: A | Discharge status: Home / Self Care | Payer: Medicare PPO | Source: Ambulatory Visit | Attending: Orthopedic Surgery | Admitting: Orthopedic Surgery

## 2020-01-06 DIAGNOSIS — Z20822 Contact with and (suspected) exposure to covid-19: Secondary | ICD-10-CM | POA: Insufficient documentation

## 2020-01-06 DIAGNOSIS — Z01812 Encounter for preprocedural laboratory examination: Secondary | ICD-10-CM | POA: Diagnosis not present

## 2020-01-06 DIAGNOSIS — M5412 Radiculopathy, cervical region: Secondary | ICD-10-CM | POA: Diagnosis not present

## 2020-01-06 DIAGNOSIS — E119 Type 2 diabetes mellitus without complications: Secondary | ICD-10-CM | POA: Insufficient documentation

## 2020-01-06 HISTORY — DX: Pneumonia, unspecified organism: J18.9

## 2020-01-06 LAB — CBC WITH DIFFERENTIAL/PLATELET
Abs Immature Granulocytes: 0 10*3/uL (ref 0.00–0.07)
Basophils Absolute: 0 10*3/uL (ref 0.0–0.1)
Basophils Relative: 1 %
Eosinophils Absolute: 0.1 10*3/uL (ref 0.0–0.5)
Eosinophils Relative: 2 %
HCT: 37.9 % — ABNORMAL LOW (ref 39.0–52.0)
Hemoglobin: 11.7 g/dL — ABNORMAL LOW (ref 13.0–17.0)
Immature Granulocytes: 0 %
Lymphocytes Relative: 42 %
Lymphs Abs: 1.4 10*3/uL (ref 0.7–4.0)
MCH: 28.4 pg (ref 26.0–34.0)
MCHC: 30.9 g/dL (ref 30.0–36.0)
MCV: 92 fL (ref 80.0–100.0)
Monocytes Absolute: 0.6 10*3/uL (ref 0.1–1.0)
Monocytes Relative: 19 %
Neutro Abs: 1.2 10*3/uL — ABNORMAL LOW (ref 1.7–7.7)
Neutrophils Relative %: 36 %
Platelets: 192 10*3/uL (ref 150–400)
RBC: 4.12 MIL/uL — ABNORMAL LOW (ref 4.22–5.81)
RDW: 13.6 % (ref 11.5–15.5)
WBC: 3.3 10*3/uL — ABNORMAL LOW (ref 4.0–10.5)
nRBC: 0 % (ref 0.0–0.2)

## 2020-01-06 LAB — URINALYSIS, ROUTINE W REFLEX MICROSCOPIC
Bilirubin Urine: NEGATIVE
Glucose, UA: NEGATIVE mg/dL
Hgb urine dipstick: NEGATIVE
Ketones, ur: NEGATIVE mg/dL
Leukocytes,Ua: NEGATIVE
Nitrite: NEGATIVE
Protein, ur: NEGATIVE mg/dL
Specific Gravity, Urine: 1.016 (ref 1.005–1.030)
pH: 6 (ref 5.0–8.0)

## 2020-01-06 LAB — HEMOGLOBIN A1C
Hgb A1c MFr Bld: 5.9 % — ABNORMAL HIGH (ref 4.8–5.6)
Mean Plasma Glucose: 122.63 mg/dL

## 2020-01-06 LAB — TYPE AND SCREEN
ABO/RH(D): O POS
Antibody Screen: NEGATIVE

## 2020-01-06 LAB — GLUCOSE, CAPILLARY: Glucose-Capillary: 89 mg/dL (ref 70–99)

## 2020-01-06 LAB — PROTIME-INR
INR: 1 (ref 0.8–1.2)
Prothrombin Time: 12.7 seconds (ref 11.4–15.2)

## 2020-01-06 LAB — SURGICAL PCR SCREEN
MRSA, PCR: NEGATIVE
Staphylococcus aureus: NEGATIVE

## 2020-01-06 LAB — APTT: aPTT: 29 seconds (ref 24–36)

## 2020-01-06 LAB — SARS CORONAVIRUS 2 (TAT 6-24 HRS): SARS Coronavirus 2: NEGATIVE

## 2020-01-06 NOTE — Progress Notes (Signed)
PCP - Dr. Jenny Reichmann  Cardiologist - Denies  Chest x-ray - Denies  EKG - 03/26/2019 (E)  Stress Test - 09/08/14 (E)  ECHO - 05/06/15 (E)  Cardiac Cath - Denies  AICD-na PM-na LOOP-na  Sleep Study - Denies CPAP - Denies  LABS- 12/31/19: CMP (E) 01/06/20: CBC w/D, PT,PTT, T/S, UA, PCR, COVID   ASA- Denies  ERAS- Yes- G2 x1  HA1C- 01/06/20 Fasting Blood Sugar - 90-126, today 89 Checks Blood Sugar __1___ time every so often. Pt sts since his pcp took him off of his diabetic medication, he does not check his bs as often. Pt told to check bs the morning of surgery.  Anesthesia- No  Pt denies having chest pain, sob, or fever at this time. All instructions explained to the pt, with a verbal understanding of the material. Pt agrees to go over the instructions while at home for a better understanding. Pt also instructed to self quarantine after being tested for COVID-19. The opportunity to ask questions was provided.   Coronavirus Screening  Have you experienced the following symptoms:  Cough yes/no: No Fever (>100.13F)  yes/no: No Runny nose yes/no: No Sore throat yes/no: No Difficulty breathing/shortness of breath  yes/no: No  Have you or a family member traveled in the last 14 days and where? yes/no: No   If the patient indicates "YES" to the above questions, their PAT will be rescheduled to limit the exposure to others and, the surgeon will be notified. THE PATIENT WILL NEED TO BE ASYMPTOMATIC FOR 14 DAYS.   If the patient is not experiencing any of these symptoms, the PAT nurse will instruct them to NOT bring anyone with them to their appointment since they may have these symptoms or traveled as well.   Please remind your patients and families that hospital visitation restrictions are in effect and the importance of the restrictions.

## 2020-01-06 NOTE — Progress Notes (Signed)
[id number] - [place], [place] [address] Phone: [phone] Fax: [phone]  [place], [place] - [address] [place] [address] Phone: [phone] Fax: [phone]              Your procedure is scheduled on Wed., Oct. 6, 2021 from 8:28AM-12:58PM            Report to [facility] A at 5:30 A.M.            Call this number if you have problems the morning of surgery:            [phone]             Remember:            Do not eat  after midnight on Oct. 5th             You may drink clear liquids until 4:30 A.M .  Clear liquids allowed are:                    Water, Juice (non-citric and without pulp - diabetics please choose diet or no sugar options), Carbonated beverages - (diabetics please choose diet or no sugar options), Clear Tea, Black Coffee only (no creamer, milk or cream including half and half), Plain Jell-O only (diabetics please choose diet or no sugar options) and Gatorade (diabetics please choose diet or no sugar options)  Enhanced Recovery after Surgery for Orthopedics Enhanced Recovery after Surgery is a protocol used to improve the stress on your body and your recovery after surgery.  Patient Instructions  . The day of surgery (if you have diabetes): o  o Drink ONE (1) Gatorade 2 (G2) by __4:30AM___ the morning of surgery o This drink was given to you during your hospital  pre-op appointment visit.  o Color of the Gatorade may vary. Red is not allowed. o Nothing else to drink after completing the  Gatorade 2 (G2).         If you have questions, please contact your surgeon's office.                         Take these medicines the morning of surgery with A SIP OF WATER :             Docusate sodium (colace)                         If Needed:             Tylenol              Gabapentin             Eye drops          As of today, STOP taking any Aspirin (unless otherwise instructed by your surgeon), Aleve, Naproxen, Ibuprofen, Motrin, Advil, Goody's, BC's, all herbal medications, fish oil, and all vitamins.  No Smoking of any kind, Tobacco/Vaping, or Alcohol products 24 hours prior to your procedure. If you use a Cpap at night, you may bring all equipment for your overnight stay.              Special instructions:   [facility]- Preparing For Surgery  Before surgery, you can play an important role. Because skin is not  sterile, your skin needs to be as free of germs as possible. You can reduce the number of germs on your skin by washing with CHG (chlorahexidine gluconate) Soap before surgery.  CHG is an antiseptic cleaner which kills germs and bonds with the skin to continue killing germs even after washing.    Please do not use if you have an allergy to CHG or antibacterial soaps. If your skin becomes reddened/irritated stop using the CHG.  Do not shave (including legs and underarms) for at least 48 hours prior to first CHG shower. It is OK to shave your face.  Please follow these instructions carefully.                                                                                                                     1. Shower the NIGHT BEFORE SURGERY and the MORNING OF SURGERY with CHG.   2. If you chose to wash your hair, wash your hair first as usual with your normal shampoo.  3. After you shampoo, rinse your hair and body thoroughly to remove the shampoo.  4. Use CHG as you would any other liquid soap. You can apply CHG directly to the skin and wash gently with a scrungie or a clean washcloth.   5. Apply the CHG Soap to your body ONLY FROM THE NECK DOWN.  Do not use on open wounds or open sores. Avoid contact with your eyes, ears, mouth and genitals (private parts). Wash Face and genitals (private parts)  with your normal soap.  6. Wash  thoroughly, paying special attention to the area where your surgery will be performed.  7. Thoroughly rinse your body with warm water from the neck down.  8. DO NOT shower/wash with your normal soap after using and rinsing off the CHG Soap.  9. Pat yourself dry with a CLEAN TOWEL.  10. Wear CLEAN PAJAMAS to bed the night before surgery, wear comfortable clothes the morning of surgery  11. Place CLEAN SHEETS on your bed the night of your first shower and DO NOT SLEEP WITH PETS.  Day of Surgery: Remember to brush your teeth WITH YOUR REGULAR TOOTHPASTE.            Do not wear jewelry.            Do not wear lotions, powders, colognes, or deodorant.            Do not shave 48 hours prior to surgery.  Men may shave face and neck.            Do not bring valuables to the hospital.            [facility] is not responsible for any belongings or valuables.  Contacts, dentures or bridgework may not be worn into surgery.   For patients admitted to the hospital, discharge time will be determined by your treatment team.          Patients discharged the day of surgery will not  be allowed to drive home, and someone age 108 and over needs to stay with them for 24 hours.  Please wear clean clothes to the hospital/surgery center.    Please read over the following fact sheets that you were given.

## 2020-01-07 NOTE — Anesthesia Preprocedure Evaluation (Addendum)
Anesthesia Evaluation  Patient identified by MRN, date of birth, ID band Patient awake    Reviewed: Allergy & Precautions, NPO status , Patient's Chart, lab work & pertinent test results  History of Anesthesia Complications Negative for: history of anesthetic complications  Airway Mallampati: I  TM Distance: >3 FB Neck ROM: Full    Dental no notable dental hx. (+) Teeth Intact, Caps, Dental Advisory Given   Pulmonary former smoker,   Lung mass    Pulmonary exam normal breath sounds clear to auscultation       Cardiovascular hypertension, Pt. on medications (-) anginaNormal cardiovascular exam Rhythm:Regular Rate:Normal   '17 TTE - mild focal basal hypertrophy of the septum. EF 55% to 60%. Grade 1 diastolic dysfunction. Mildly dilated RA.    Neuro/Psych  Acoustic neuroma   Neuromuscular disease (paresthesias) negative psych ROS   GI/Hepatic Neg liver ROS, GERD  Medicated, Stomach cancer    Endo/Other  diabetes, Type 2  Renal/GU negative Renal ROS     Musculoskeletal  (+) Arthritis , Osteoarthritis,    Abdominal   Peds  Hematology  (+) Blood dyscrasia, anemia ,   Anesthesia Other Findings Covid neg 05/20/19  Gastric cancer pT3pN2 s/p distal gastrectomy with Billroth II gastrojejunostomy 03/19/2019  Reproductive/Obstetrics                           Anesthesia Physical  Anesthesia Plan  ASA: III  Anesthesia Plan: General   Post-op Pain Management:    Induction: Intravenous  PONV Risk Score and Plan: 2 and Treatment may vary due to age or medical condition and Ondansetron  Airway Management Planned: Oral ETT and Video Laryngoscope Planned  Additional Equipment: None  Intra-op Plan:   Post-operative Plan: Extubation in OR  Informed Consent: I have reviewed the patients History and Physical, chart, labs and discussed the procedure including the risks, benefits and  alternatives for the proposed anesthesia with the patient or authorized representative who has indicated his/her understanding and acceptance.     Dental advisory given  Plan Discussed with: CRNA and Anesthesiologist  Anesthesia Plan Comments:         Anesthesia Quick Evaluation

## 2020-01-08 ENCOUNTER — Ambulatory Visit (HOSPITAL_COMMUNITY): Payer: Medicare PPO

## 2020-01-08 ENCOUNTER — Other Ambulatory Visit: Payer: Self-pay

## 2020-01-08 ENCOUNTER — Ambulatory Visit (HOSPITAL_COMMUNITY): Payer: Medicare PPO | Admitting: Anesthesiology

## 2020-01-08 ENCOUNTER — Observation Stay (HOSPITAL_COMMUNITY)
Admission: RE | Admit: 2020-01-08 | Discharge: 2020-01-09 | Disposition: A | Discharge status: Home / Self Care | Payer: Medicare PPO | Attending: Orthopedic Surgery | Admitting: Orthopedic Surgery

## 2020-01-08 ENCOUNTER — Encounter (HOSPITAL_COMMUNITY)
Admission: RE | Disposition: A | Discharge status: Home / Self Care | Payer: Self-pay | Source: Home / Self Care | Attending: Orthopedic Surgery

## 2020-01-08 ENCOUNTER — Encounter (HOSPITAL_COMMUNITY): Payer: Self-pay | Admitting: Orthopedic Surgery

## 2020-01-08 DIAGNOSIS — M5000 Cervical disc disorder with myelopathy, unspecified cervical region: Secondary | ICD-10-CM | POA: Diagnosis not present

## 2020-01-08 DIAGNOSIS — Z87891 Personal history of nicotine dependence: Secondary | ICD-10-CM | POA: Diagnosis not present

## 2020-01-08 DIAGNOSIS — M4802 Spinal stenosis, cervical region: Secondary | ICD-10-CM | POA: Insufficient documentation

## 2020-01-08 DIAGNOSIS — M4712 Other spondylosis with myelopathy, cervical region: Secondary | ICD-10-CM | POA: Diagnosis not present

## 2020-01-08 DIAGNOSIS — E119 Type 2 diabetes mellitus without complications: Secondary | ICD-10-CM | POA: Diagnosis not present

## 2020-01-08 DIAGNOSIS — G959 Disease of spinal cord, unspecified: Secondary | ICD-10-CM | POA: Diagnosis present

## 2020-01-08 DIAGNOSIS — Z419 Encounter for procedure for purposes other than remedying health state, unspecified: Secondary | ICD-10-CM

## 2020-01-08 DIAGNOSIS — I10 Essential (primary) hypertension: Secondary | ICD-10-CM | POA: Diagnosis not present

## 2020-01-08 DIAGNOSIS — Z79899 Other long term (current) drug therapy: Secondary | ICD-10-CM | POA: Insufficient documentation

## 2020-01-08 DIAGNOSIS — M5412 Radiculopathy, cervical region: Secondary | ICD-10-CM | POA: Diagnosis not present

## 2020-01-08 DIAGNOSIS — M4322 Fusion of spine, cervical region: Secondary | ICD-10-CM | POA: Diagnosis not present

## 2020-01-08 DIAGNOSIS — M79602 Pain in left arm: Secondary | ICD-10-CM | POA: Diagnosis present

## 2020-01-08 HISTORY — PX: ANTERIOR CERVICAL DECOMPRESSION/DISCECTOMY FUSION 4 LEVELS: SHX5556

## 2020-01-08 LAB — GLUCOSE, CAPILLARY
Glucose-Capillary: 92 mg/dL (ref 70–99)
Glucose-Capillary: 107 mg/dL — ABNORMAL HIGH (ref 70–99)

## 2020-01-08 SURGERY — ANTERIOR CERVICAL DECOMPRESSION/DISCECTOMY FUSION 4 LEVELS
Anesthesia: General

## 2020-01-08 MED ORDER — EPHEDRINE SULFATE 50 MG/ML IJ SOLN
INTRAMUSCULAR | Status: DC | PRN
  Administered 2020-01-08: 10 mg via INTRAVENOUS

## 2020-01-08 MED ORDER — ONDANSETRON HCL 4 MG/2ML IJ SOLN
INTRAMUSCULAR | Status: DC | PRN
  Administered 2020-01-08: 4 mg via INTRAVENOUS

## 2020-01-08 MED ORDER — ONDANSETRON HCL 4 MG/2ML IJ SOLN
INTRAMUSCULAR | Status: AC
  Filled 2020-01-08: qty 2

## 2020-01-08 MED ORDER — BUPIVACAINE-EPINEPHRINE (PF) 0.25% -1:200000 IJ SOLN
INTRAMUSCULAR | Status: AC
  Filled 2020-01-08: qty 30

## 2020-01-08 MED ORDER — ACETAMINOPHEN 160 MG/5ML PO SOLN: 325.0000 mg | ORAL | Status: DC | PRN

## 2020-01-08 MED ORDER — ALUM & MAG HYDROXIDE-SIMETH 200-200-20 MG/5ML PO SUSP: 30.0000 mL | Freq: Four times a day (QID) | ORAL | Status: DC | PRN

## 2020-01-08 MED ORDER — FENTANYL CITRATE (PF) 250 MCG/5ML IJ SOLN
INTRAMUSCULAR | Status: AC
  Filled 2020-01-08: qty 5

## 2020-01-08 MED ORDER — ONDANSETRON HCL 4 MG/2ML IJ SOLN
4.0000 mg | Freq: Once | INTRAMUSCULAR | Status: AC | PRN
  Administered 2020-01-08: 4 mg via INTRAVENOUS

## 2020-01-08 MED ORDER — PANTOPRAZOLE SODIUM 40 MG IV SOLR: 40.0000 mg | Freq: Every day | INTRAVENOUS | Status: DC

## 2020-01-08 MED ORDER — DOCUSATE SODIUM 100 MG PO CAPS
100.0000 mg | ORAL_CAPSULE | Freq: Two times a day (BID) | ORAL | Status: DC
  Administered 2020-01-09: 100 mg via ORAL
  Filled 2020-01-08 (×2): qty 1

## 2020-01-08 MED ORDER — THROMBIN 20000 UNITS EX SOLR
CUTANEOUS | Status: DC | PRN
  Administered 2020-01-08: 15000 [IU] via TOPICAL

## 2020-01-08 MED ORDER — MEPERIDINE HCL 25 MG/ML IJ SOLN: 6.2500 mg | INTRAMUSCULAR | Status: DC | PRN

## 2020-01-08 MED ORDER — METHOCARBAMOL 500 MG PO TABS
500.0000 mg | ORAL_TABLET | Freq: Four times a day (QID) | ORAL | Status: DC | PRN
  Administered 2020-01-08 (×2): 500 mg via ORAL
  Filled 2020-01-08 (×2): qty 1

## 2020-01-08 MED ORDER — ACETAMINOPHEN 325 MG PO TABS: 650.0000 mg | ORAL_TABLET | ORAL | Status: DC | PRN

## 2020-01-08 MED ORDER — SODIUM CHLORIDE 0.9% FLUSH
3.0000 mL | Freq: Two times a day (BID) | INTRAVENOUS | Status: DC
  Administered 2020-01-08 (×2): 3 mL via INTRAVENOUS

## 2020-01-08 MED ORDER — SENNOSIDES-DOCUSATE SODIUM 8.6-50 MG PO TABS: 1.0000 | ORAL_TABLET | Freq: Every evening | ORAL | Status: DC | PRN

## 2020-01-08 MED ORDER — ACETAMINOPHEN 325 MG PO TABS: 325.0000 mg | ORAL_TABLET | ORAL | Status: DC | PRN

## 2020-01-08 MED ORDER — PHENYLEPHRINE HCL (PRESSORS) 10 MG/ML IV SOLN
INTRAVENOUS | Status: DC | PRN
  Administered 2020-01-08: 120 ug via INTRAVENOUS
  Administered 2020-01-08: 80 ug via INTRAVENOUS

## 2020-01-08 MED ORDER — THROMBIN (RECOMBINANT) 5000 UNITS EX SOLR
CUTANEOUS | Status: AC
  Filled 2020-01-08: qty 5000

## 2020-01-08 MED ORDER — DOCUSATE SODIUM 100 MG PO CAPS: 100.0000 mg | ORAL_CAPSULE | Freq: Every day | ORAL | Status: DC

## 2020-01-08 MED ORDER — THROMBIN 5000 UNITS EX SOLR
CUTANEOUS | Status: AC
  Filled 2020-01-08: qty 15000

## 2020-01-08 MED ORDER — SODIUM CHLORIDE 0.9% FLUSH: 3.0000 mL | INTRAVENOUS | Status: DC | PRN

## 2020-01-08 MED ORDER — POLYVINYL ALCOHOL 1.4 % OP SOLN
1.0000 [drp] | Freq: Three times a day (TID) | OPHTHALMIC | Status: DC | PRN
  Filled 2020-01-08: qty 15

## 2020-01-08 MED ORDER — PHENYLEPHRINE HCL-NACL 10-0.9 MG/250ML-% IV SOLN
INTRAVENOUS | Status: DC | PRN
  Administered 2020-01-08: 20 ug/min via INTRAVENOUS

## 2020-01-08 MED ORDER — OXYCODONE HCL 5 MG PO TABS: 5.0000 mg | ORAL_TABLET | Freq: Once | ORAL | Status: DC | PRN

## 2020-01-08 MED ORDER — EPHEDRINE SULFATE 50 MG/ML IJ SOLN: INTRAMUSCULAR | Status: DC | PRN

## 2020-01-08 MED ORDER — ONDANSETRON HCL 4 MG PO TABS
4.0000 mg | ORAL_TABLET | Freq: Four times a day (QID) | ORAL | Status: DC | PRN
  Administered 2020-01-09: 4 mg via ORAL
  Filled 2020-01-08: qty 1

## 2020-01-08 MED ORDER — ROCURONIUM BROMIDE 10 MG/ML (PF) SYRINGE
PREFILLED_SYRINGE | INTRAVENOUS | Status: AC
  Filled 2020-01-08: qty 20

## 2020-01-08 MED ORDER — PROPOFOL 10 MG/ML IV BOLUS
INTRAVENOUS | Status: AC
  Filled 2020-01-08: qty 20

## 2020-01-08 MED ORDER — FLEET ENEMA 7-19 GM/118ML RE ENEM: 1.0000 | ENEMA | Freq: Once | RECTAL | Status: DC | PRN

## 2020-01-08 MED ORDER — METHOCARBAMOL 500 MG PO TABS: 500.0000 mg | ORAL_TABLET | Freq: Four times a day (QID) | ORAL | 0 refills | Status: DC | PRN

## 2020-01-08 MED ORDER — BISACODYL 5 MG PO TBEC: 5.0000 mg | DELAYED_RELEASE_TABLET | Freq: Every day | ORAL | Status: DC | PRN

## 2020-01-08 MED ORDER — SUGAMMADEX SODIUM 200 MG/2ML IV SOLN
INTRAVENOUS | Status: DC | PRN
  Administered 2020-01-08: 400 mg via INTRAVENOUS

## 2020-01-08 MED ORDER — FENTANYL CITRATE (PF) 100 MCG/2ML IJ SOLN: 25.0000 ug | INTRAMUSCULAR | Status: DC | PRN

## 2020-01-08 MED ORDER — CEFAZOLIN SODIUM-DEXTROSE 2-4 GM/100ML-% IV SOLN
2.0000 g | INTRAVENOUS | Status: AC
  Administered 2020-01-08: 2 g via INTRAVENOUS
  Filled 2020-01-08: qty 100

## 2020-01-08 MED ORDER — ADULT MULTIVITAMIN W/MINERALS CH
1.0000 | ORAL_TABLET | Freq: Every day | ORAL | Status: DC
  Administered 2020-01-09: 1 via ORAL
  Filled 2020-01-08: qty 1

## 2020-01-08 MED ORDER — ACETAMINOPHEN 650 MG RE SUPP: 650.0000 mg | RECTAL | Status: DC | PRN

## 2020-01-08 MED ORDER — OXYCODONE-ACETAMINOPHEN 5-325 MG PO TABS
1.0000 | ORAL_TABLET | ORAL | Status: DC | PRN
  Administered 2020-01-08: 2 via ORAL
  Administered 2020-01-08: 1 via ORAL
  Administered 2020-01-08 – 2020-01-09 (×2): 2 via ORAL
  Filled 2020-01-08: qty 2
  Filled 2020-01-08: qty 1
  Filled 2020-01-08 (×2): qty 2

## 2020-01-08 MED ORDER — PANTOPRAZOLE SODIUM 40 MG PO TBEC
40.0000 mg | DELAYED_RELEASE_TABLET | Freq: Every day | ORAL | Status: DC
  Administered 2020-01-09: 40 mg via ORAL
  Filled 2020-01-08: qty 1

## 2020-01-08 MED ORDER — LIDOCAINE 2% (20 MG/ML) 5 ML SYRINGE
INTRAMUSCULAR | Status: AC
  Filled 2020-01-08: qty 5

## 2020-01-08 MED ORDER — LATANOPROST 0.005 % OP SOLN
1.0000 [drp] | Freq: Every day | OPHTHALMIC | Status: DC
  Administered 2020-01-08: 1 [drp] via OPHTHALMIC
  Filled 2020-01-08: qty 2.5

## 2020-01-08 MED ORDER — EPHEDRINE 5 MG/ML INJ
INTRAVENOUS | Status: AC
  Filled 2020-01-08: qty 10

## 2020-01-08 MED ORDER — FAMOTIDINE 20 MG PO TABS
20.0000 mg | ORAL_TABLET | Freq: Every day | ORAL | Status: DC
  Administered 2020-01-08: 20 mg via ORAL
  Filled 2020-01-08: qty 1

## 2020-01-08 MED ORDER — SODIUM CHLORIDE 0.9 % IV SOLN
250.0000 mL | INTRAVENOUS | Status: DC
  Administered 2020-01-08: 250 mL via INTRAVENOUS

## 2020-01-08 MED ORDER — BUPIVACAINE-EPINEPHRINE 0.25% -1:200000 IJ SOLN
INTRAMUSCULAR | Status: DC | PRN
  Administered 2020-01-08: 30 mL

## 2020-01-08 MED ORDER — ORAL CARE MOUTH RINSE: 15.0000 mL | Freq: Once | OROMUCOSAL | Status: AC

## 2020-01-08 MED ORDER — ROCURONIUM BROMIDE 100 MG/10ML IV SOLN
INTRAVENOUS | Status: DC | PRN
  Administered 2020-01-08: 100 mg via INTRAVENOUS
  Administered 2020-01-08: 20 mg via INTRAVENOUS
  Administered 2020-01-08: 30 mg via INTRAVENOUS
  Administered 2020-01-08: 20 mg via INTRAVENOUS

## 2020-01-08 MED ORDER — FENTANYL CITRATE (PF) 100 MCG/2ML IJ SOLN
INTRAMUSCULAR | Status: DC | PRN
  Administered 2020-01-08 (×2): 50 ug via INTRAVENOUS
  Administered 2020-01-08: 25 ug via INTRAVENOUS
  Administered 2020-01-08: 50 ug via INTRAVENOUS

## 2020-01-08 MED ORDER — MENTHOL 3 MG MT LOZG
1.0000 | LOZENGE | OROMUCOSAL | Status: DC | PRN
  Filled 2020-01-08: qty 9

## 2020-01-08 MED ORDER — ONDANSETRON HCL 4 MG/2ML IJ SOLN: 4.0000 mg | Freq: Four times a day (QID) | INTRAMUSCULAR | Status: DC | PRN

## 2020-01-08 MED ORDER — METHOCARBAMOL 500 MG PO TABS
ORAL_TABLET | ORAL | Status: AC
  Filled 2020-01-08: qty 1

## 2020-01-08 MED ORDER — PROPOFOL 10 MG/ML IV BOLUS
INTRAVENOUS | Status: DC | PRN
  Administered 2020-01-08: 200 mg via INTRAVENOUS

## 2020-01-08 MED ORDER — OXYCODONE-ACETAMINOPHEN 5-325 MG PO TABS: 1.0000 | ORAL_TABLET | ORAL | 0 refills | Status: DC | PRN

## 2020-01-08 MED ORDER — LACTATED RINGERS IV SOLN: INTRAVENOUS | Status: DC

## 2020-01-08 MED ORDER — OXYCODONE HCL 5 MG/5ML PO SOLN: 5.0000 mg | Freq: Once | ORAL | Status: DC | PRN

## 2020-01-08 MED ORDER — CHLORHEXIDINE GLUCONATE 0.12 % MT SOLN
15.0000 mL | Freq: Once | OROMUCOSAL | Status: AC
  Administered 2020-01-08: 15 mL via OROMUCOSAL
  Filled 2020-01-08: qty 15

## 2020-01-08 MED ORDER — 0.9 % SODIUM CHLORIDE (POUR BTL) OPTIME
TOPICAL | Status: DC | PRN
  Administered 2020-01-08: 1000 mL

## 2020-01-08 MED ORDER — LIDOCAINE HCL (CARDIAC) PF 100 MG/5ML IV SOSY
PREFILLED_SYRINGE | INTRAVENOUS | Status: DC | PRN
  Administered 2020-01-08: 100 mg via INTRAVENOUS

## 2020-01-08 MED ORDER — METHOCARBAMOL 1000 MG/10ML IJ SOLN
500.0000 mg | Freq: Four times a day (QID) | INTRAVENOUS | Status: DC | PRN
  Filled 2020-01-08: qty 5

## 2020-01-08 MED ORDER — CEFAZOLIN SODIUM-DEXTROSE 1-4 GM/50ML-% IV SOLN
1.0000 g | Freq: Three times a day (TID) | INTRAVENOUS | Status: AC
  Administered 2020-01-08 – 2020-01-09 (×2): 1 g via INTRAVENOUS
  Filled 2020-01-08 (×2): qty 50

## 2020-01-08 MED ORDER — PHENYLEPHRINE 40 MCG/ML (10ML) SYRINGE FOR IV PUSH (FOR BLOOD PRESSURE SUPPORT)
PREFILLED_SYRINGE | INTRAVENOUS | Status: AC
  Filled 2020-01-08: qty 10

## 2020-01-08 MED ORDER — TAMSULOSIN HCL 0.4 MG PO CAPS
0.4000 mg | ORAL_CAPSULE | Freq: Every day | ORAL | Status: DC
  Administered 2020-01-08 – 2020-01-09 (×2): 0.4 mg via ORAL
  Filled 2020-01-08 (×2): qty 1

## 2020-01-08 MED ORDER — POVIDONE-IODINE 7.5 % EX SOLN
Freq: Once | CUTANEOUS | Status: DC
  Filled 2020-01-08: qty 118

## 2020-01-08 MED ORDER — GABAPENTIN 100 MG PO CAPS: 100.0000 mg | ORAL_CAPSULE | Freq: Two times a day (BID) | ORAL | Status: DC | PRN

## 2020-01-08 MED ORDER — PHENOL 1.4 % MT LIQD
1.0000 | OROMUCOSAL | Status: DC | PRN
  Filled 2020-01-08: qty 177

## 2020-01-08 SURGICAL SUPPLY — 75 items
APL SKNCLS STERI-STRIP NONHPOA (GAUZE/BANDAGES/DRESSINGS) ×1
ASP/CLT FLD ANG ADJ TUBE STRL (MISCELLANEOUS) ×2
ASPIRATOR COLLECTOR MID EAR (MISCELLANEOUS) ×2 IMPLANT
BENZOIN TINCTURE PRP APPL 2/3 (GAUZE/BANDAGES/DRESSINGS) ×2 IMPLANT
BIT DRILL NEURO 2X3.1 SFT TUCH (MISCELLANEOUS) ×1 IMPLANT
BIT DRILL SRG 14X2.2XFLT CHK (BIT) IMPLANT
BIT DRL SRG 14X2.2XFLT CHK (BIT) ×1
BLADE CLIPPER SURG (BLADE) ×2 IMPLANT
BLADE SURG 15 STRL LF DISP TIS (BLADE) ×1 IMPLANT
BLADE SURG 15 STRL SS (BLADE) ×2
BONE VIVIGEN FORMABLE 1.3CC (Bone Implant) ×4 IMPLANT
BUR MATCHSTICK NEURO 3.0 LAGG (BURR) ×1 IMPLANT
CARTRIDGE OIL MAESTRO DRILL (MISCELLANEOUS) ×1 IMPLANT
CLOSURE STERI-STRIP 1/4X4 (GAUZE/BANDAGES/DRESSINGS) ×1 IMPLANT
COVER SURGICAL LIGHT HANDLE (MISCELLANEOUS) ×2 IMPLANT
COVER WAND RF STERILE (DRAPES) ×2 IMPLANT
DECANTER SPIKE VIAL GLASS SM (MISCELLANEOUS) ×2 IMPLANT
DIFFUSER DRILL AIR PNEUMATIC (MISCELLANEOUS) ×2 IMPLANT
DRAPE C-ARM 42X72 X-RAY (DRAPES) ×2 IMPLANT
DRAPE POUCH INSTRU U-SHP 10X18 (DRAPES) ×2 IMPLANT
DRAPE SURG 17X23 STRL (DRAPES) ×6 IMPLANT
DRILL BIT SKYLINE 14MM (BIT) ×2
DRILL NEURO 2X3.1 SOFT TOUCH (MISCELLANEOUS) ×2
DURAPREP 26ML APPLICATOR (WOUND CARE) ×2 IMPLANT
ELECT COATED BLADE 2.86 ST (ELECTRODE) ×2 IMPLANT
ELECT REM PT RETURN 9FT ADLT (ELECTROSURGICAL) ×2
ELECTRODE REM PT RTRN 9FT ADLT (ELECTROSURGICAL) ×1 IMPLANT
GAUZE 4X4 16PLY RFD (DISPOSABLE) ×2 IMPLANT
GAUZE SPONGE 4X4 12PLY STRL (GAUZE/BANDAGES/DRESSINGS) ×2 IMPLANT
GLOVE BIO SURGEON STRL SZ7 (GLOVE) ×2 IMPLANT
GLOVE BIO SURGEON STRL SZ8 (GLOVE) ×2 IMPLANT
GLOVE BIOGEL PI IND STRL 7.0 (GLOVE) ×2 IMPLANT
GLOVE BIOGEL PI IND STRL 8 (GLOVE) ×1 IMPLANT
GLOVE BIOGEL PI INDICATOR 7.0 (GLOVE) ×2
GLOVE BIOGEL PI INDICATOR 8 (GLOVE) ×1
GOWN STRL REUS W/ TWL LRG LVL3 (GOWN DISPOSABLE) ×1 IMPLANT
GOWN STRL REUS W/ TWL XL LVL3 (GOWN DISPOSABLE) ×1 IMPLANT
GOWN STRL REUS W/TWL LRG LVL3 (GOWN DISPOSABLE) ×2
GOWN STRL REUS W/TWL XL LVL3 (GOWN DISPOSABLE) ×2
GRAFT BNE MATRIX VG FRMBL SM 1 (Bone Implant) IMPLANT
INTERLOCK LRDTC CRVCL VBR 7MM (Bone Implant) IMPLANT
INTERLOCK LRDTC CRVCL VBR SM (Bone Implant) IMPLANT
IV CATH 14GX2 1/4 (CATHETERS) ×2 IMPLANT
KIT BASIN OR (CUSTOM PROCEDURE TRAY) ×2 IMPLANT
KIT TURNOVER KIT B (KITS) ×2 IMPLANT
LORDOTIC CERVICAL VBR 7MM SM (Bone Implant) ×4 IMPLANT
LORDOTIC CERVICAL VBR SM 5MM (Bone Implant) ×2 IMPLANT
MANIFOLD NEPTUNE II (INSTRUMENTS) ×2 IMPLANT
NDL PRECISIONGLIDE 27X1.5 (NEEDLE) ×1 IMPLANT
NDL SPNL 20GX3.5 QUINCKE YW (NEEDLE) ×1 IMPLANT
NEEDLE PRECISIONGLIDE 27X1.5 (NEEDLE) ×2 IMPLANT
NEEDLE SPNL 20GX3.5 QUINCKE YW (NEEDLE) ×2 IMPLANT
NS IRRIG 1000ML POUR BTL (IV SOLUTION) ×2 IMPLANT
OIL CARTRIDGE MAESTRO DRILL (MISCELLANEOUS) ×2
PACK ORTHO CERVICAL (CUSTOM PROCEDURE TRAY) ×2 IMPLANT
PAD ARMBOARD 7.5X6 YLW CONV (MISCELLANEOUS) ×4 IMPLANT
PATTIES SURGICAL .5 X.5 (GAUZE/BANDAGES/DRESSINGS) ×1 IMPLANT
PIN DISTRACTION 14 (PIN) ×2 IMPLANT
PLATE SKYLINE 3 LVL 48MM (Plate) ×1 IMPLANT
POSITIONER HEAD DONUT 9IN (MISCELLANEOUS) ×2 IMPLANT
SCREW SKYLINE VAR OS 14MM (Screw) ×8 IMPLANT
SPONGE INTESTINAL PEANUT (DISPOSABLE) ×4 IMPLANT
SPONGE SURGIFOAM ABS GEL 100 (HEMOSTASIS) ×2 IMPLANT
STRIP CLOSURE SKIN 1/2X4 (GAUZE/BANDAGES/DRESSINGS) ×2 IMPLANT
SUT MNCRL AB 4-0 PS2 18 (SUTURE) ×2 IMPLANT
SUT VIC AB 2-0 CT2 18 VCP726D (SUTURE) ×3 IMPLANT
SYR CONTROL 10ML LL (SYRINGE) ×4 IMPLANT
TAPE CLOTH 4X10 WHT NS (GAUZE/BANDAGES/DRESSINGS) ×2 IMPLANT
TAPE CLOTH SURG 4X10 WHT LF (GAUZE/BANDAGES/DRESSINGS) ×1 IMPLANT
TAPE UMBILICAL COTTON 1/8X30 (MISCELLANEOUS) ×2 IMPLANT
TOWEL GREEN STERILE (TOWEL DISPOSABLE) ×3 IMPLANT
TOWEL GREEN STERILE FF (TOWEL DISPOSABLE) ×2 IMPLANT
TRAY FOLEY MTR SLVR 16FR STAT (SET/KITS/TRAYS/PACK) ×2 IMPLANT
WATER STERILE IRR 1000ML POUR (IV SOLUTION) ×2 IMPLANT
YANKAUER SUCT BULB TIP NO VENT (SUCTIONS) ×2 IMPLANT

## 2020-01-08 NOTE — Progress Notes (Signed)
Orthopedic Tech Progress Note Patient Details:  Jonathan Lewis May 31, 1940 [id number] Patient has philly collar for shower at home [name], male   DOB: 08/20/1940, 79 y.o.   MRN: [id number]   Janit Pagan 01/08/2020, 3:30 PM

## 2020-01-08 NOTE — Anesthesia Postprocedure Evaluation (Signed)
Anesthesia Post Note  Patient: CLEVON KHADER  Procedure(s) Performed: ANTERIOR CERVICAL DECOMPRESSION FUSION CERVICAL 3-4, CERVICAL 4-5, CERVIAL 5-6 WITH INSTRUMENTATION AND ALLOGRAFT (N/A )     Patient location during evaluation: PACU Anesthesia Type: General Level of consciousness: awake and alert Pain management: pain level controlled Vital Signs Assessment: post-procedure vital signs reviewed and stable Respiratory status: spontaneous breathing, nonlabored ventilation, respiratory function stable and patient connected to nasal cannula oxygen Cardiovascular status: blood pressure returned to baseline and stable Postop Assessment: no apparent nausea or vomiting Anesthetic complications: no   No complications documented.  Last Vitals:  Vitals:   01/08/20 1248 01/08/20 1303  BP: 128/76 130/80  Pulse: 70 73  Resp: 14 18  Temp:  36.7 C  SpO2: 98% 98%    Last Pain:  Vitals:   01/08/20 1248  TempSrc:   PainSc: 0-No pain                 Kamelia Lampkins

## 2020-01-08 NOTE — Anesthesia Procedure Notes (Signed)
Procedure Name: Intubation Date/Time: 01/08/2020 8:40 AM Performed by: Mischell Branford T, CRNA Pre-anesthesia Checklist: Patient identified, Emergency Drugs available, Suction available and Patient being monitored Patient Re-evaluated:Patient Re-evaluated prior to induction Oxygen Delivery Method: Circle system utilized Preoxygenation: Pre-oxygenation with 100% oxygen Induction Type: IV induction Ventilation: Mask ventilation without difficulty Laryngoscope Size: Glidescope and 4 Grade View: Grade I Tube type: Oral Tube size: 8.0 mm Number of attempts: 1 Airway Equipment and Method: Stylet,  Oral airway and Video-laryngoscopy Placement Confirmation: ETT inserted through vocal cords under direct vision,  positive ETCO2 and breath sounds checked- equal and bilateral Secured at: 22 cm Tube secured with: Tape Dental Injury: Teeth and Oropharynx as per pre-operative assessment

## 2020-01-08 NOTE — H&P (Signed)
PREOPERATIVE H&P  Chief Complaint: Left arm pain, weakness  HPI: Jonathan Lewis is a 79 y.o. male who presents with ongoing pain in the left arm as well as b/l UE weakness  MRI reveals stenosis spanning C3-C6  Patient has failed multiple forms of conservative care and continues to have pain (see office notes for additional details regarding the patient's full course of treatment)  Past Medical History:  Diagnosis Date  . Acoustic neuroma ([place]) 06/25/2015  . Allergic rhinitis 07/18/2014  . Arthritis   . BPH (benign prostatic hyperplasia)   . Cancer ([place])    stage III stomach-Dx 03/2019  . Carbuncle    recurrent MRSA carbuncles  . Cataract   . Diabetes mellitus    Type II  . Disk prolapse   . Glaucoma   . Hyperlipidemia   . Hypertension   . Male hypogonadism 07/16/2014  . Neuropathy   . Pneumonia   . Sinus bradycardia    chronic, asymptomatic  . Sinusitis 07/29/2012   Past Surgical History:  Procedure Laterality Date  . BALLOON DILATION N/A 03/12/2019   Procedure: BALLOON DILATION;  Surgeon: Rush Landmark Telford Nab., MD;  Location: [facility];  Service: Gastroenterology;  Laterality: N/A;  pyloric  . BIOPSY  03/08/2019   Procedure: BIOPSY;  Surgeon: Yetta Flock, MD;  Location: [facility];  Service: Gastroenterology;;  . BIOPSY  03/12/2019   Procedure: BIOPSY;  Surgeon: Irving Copas., MD;  Location: [facility];  Service: Gastroenterology;;  . CATARACT EXTRACTION     x 2  . COLONOSCOPY    . ESOPHAGOGASTRODUODENOSCOPY N/A 03/08/2019   Procedure: ESOPHAGOGASTRODUODENOSCOPY (EGD);  Surgeon: Yetta Flock, MD;  Location: [facility];  Service: Gastroenterology;  Laterality: N/A;  . ESOPHAGOGASTRODUODENOSCOPY (EGD) WITH PROPOFOL N/A 03/12/2019   Procedure: ESOPHAGOGASTRODUODENOSCOPY (EGD) WITH PROPOFOL;  Surgeon: Rush Landmark Telford Nab., MD;  Location: [facility];  Service: Gastroenterology;  Laterality: N/A;  . FINE NEEDLE ASPIRATION   03/12/2019   Procedure: FINE NEEDLE ASPIRATION (FNA) LINEAR;  Surgeon: Irving Copas., MD;  Location: [facility];  Service: Gastroenterology;;  . FOREIGN BODY REMOVAL  03/08/2019   Procedure: FOREIGN BODY REMOVAL;  Surgeon: Yetta Flock, MD;  Location: [facility];  Service: Gastroenterology;;  . IR PATIENT EVAL TECH 0-60 MINS  04/03/2019  . LAPAROTOMY N/A 03/19/2019   Procedure: EXPLORATORY LAPAROTOMY, distal GASTRECTOMY AND PLACEMENT OF G AND J TUBE, gastric jejunostomy;  Surgeon: Greer Pickerel, MD;  Location: [facility];  Service: General;  Laterality: N/A;  . POLYPECTOMY    . PORTACATH PLACEMENT N/A 03/26/2019   Procedure: INSERTION PORT-A-CATH;  Surgeon: Michael Boston, MD;  Location: [facility];  Service: General;  Laterality: N/A;  . UPPER ESOPHAGEAL ENDOSCOPIC ULTRASOUND (EUS) N/A 03/12/2019   Procedure: UPPER ESOPHAGEAL ENDOSCOPIC ULTRASOUND (EUS);  Surgeon: Irving Copas., MD;  Location: [facility];  Service: Gastroenterology;  Laterality: N/A;  . VIDEO BRONCHOSCOPY WITH ENDOBRONCHIAL ULTRASOUND N/A 05/22/2019   Procedure: VIDEO BRONCHOSCOPY WITH ENDOBRONCHIAL ULTRASOUND;  Surgeon: Garner Nash, DO;  Location: [facility];  Service: Thoracic;  Laterality: N/A;   Social History   Socioeconomic History  . Marital status: Married    Spouse name: Not on file  . Number of children: 7  . Years of education: 7  . Highest education level: Not on file  Occupational History  . Occupation: Mining engineer: RETIRED  Tobacco Use  . Smoking status: Former Smoker    Packs/day: 1.00    Years: 24.00  Pack years: 24.00    Types: Cigarettes    Quit date: 10/03/1978    Years since quitting: 41.2  . Smokeless tobacco: Never Used  Vaping Use  . Vaping Use: Never used  Substance and Sexual Activity  . Alcohol use: Not Currently    Alcohol/week: 5.0 standard drinks    Types: 5 Standard drinks or equivalent per week    Comment: pint, occasion beer  . Drug use: No  .  Sexual activity: Yes    Partners: Female  Other Topics Concern  . Not on file  Social History Narrative   9th grade.  Married '60. Jonathan Lewis 3 sons -'80 '64, '66; 4 dtrs -'60, '62, '61, '65; 15 grands, 25 great-grands.   Jonathan Lewis, some cleaning work. Lives alone with wife.   ACD- discussed living will and HCPOA (July '14) provided packet.          Social Determinants of Health   Financial Resource Strain: Low Risk   . Difficulty of Paying Living Expenses: Not hard at all  Food Insecurity: No Food Insecurity  . Worried About Charity fundraiser in the Last Year: Never true  . Ran Out of Food in the Last Year: Never true  Transportation Needs: No Transportation Needs  . Lack of Transportation (Medical): No  . Lack of Transportation (Non-Medical): No  Physical Activity: Inactive  . Days of Exercise per Week: 0 days  . Minutes of Exercise per Session: 0 min  Stress: No Stress Concern Present  . Feeling of Stress : Not at all  Social Connections: Socially Integrated  . Frequency of Communication with Friends and Family: More than three times a week  . Frequency of Social Gatherings with Friends and Family: More than three times a week  . Attends Religious Services: More than 4 times per year  . Active Member of Clubs or Organizations: Yes  . Attends Archivist Meetings: More than 4 times per year  . Marital Status: Married   Family History  Problem Relation Age of Onset  . Cancer Mother   . Hypertension Mother   . Diabetes Sister   . Cancer Brother        colon  . Hypertension Brother   . Hyperlipidemia Brother   . Stroke Brother   . Hypertension Sister   . Colon cancer Neg Hx   . Heart attack Neg Hx   . Sudden death Neg Hx    No Known Allergies Prior to Admission medications   Medication Sig Start Date End Date Taking? Authorizing Provider  acetaminophen (TYLENOL) 325 MG tablet Take 2 tablets (650 mg total) by mouth every 6 (six) hours as needed for mild pain.  06/25/19  Yes Volanda Napoleon, MD  docusate sodium (COLACE) 100 MG capsule Take 1 capsule (100 mg total) by mouth daily. 06/25/19  Yes Volanda Napoleon, MD  gabapentin (NEURONTIN) 100 MG capsule Take 1 capsule (100 mg total) by mouth every 12 (twelve) hours. Patient taking differently: Take 100 mg by mouth 2 (two) times daily as needed (pain.).  06/25/19  Yes Ennever, Rudell Cobb, MD  latanoprost (XALATAN) 0.005 % ophthalmic solution Place 1 drop into both eyes at bedtime.  01/09/19  Yes [provider]  Multiple Vitamin (MULTIVITAMIN WITH MINERALS) TABS tablet Take 1 tablet by mouth daily.   Yes [provider]  SYSTANE ULTRA 0.4-0.3 % SOLN Apply 1 drop to eye 3 (three) times daily as needed (dry eyes).  01/09/19  Yes [provider]  famotidine (PEPCID) 20 MG tablet Take 1 tablet (20 mg total) by mouth daily. Patient taking differently: Take 20 mg by mouth at bedtime.  12/19/19   Volanda Napoleon, MD  lidocaine-prilocaine (EMLA) cream Apply 1 application topically as needed. Place on the port one hour before appointment. Patient not taking: Reported on 12/26/2019 05/06/19   Volanda Napoleon, MD     All other systems have been reviewed and were otherwise negative with the exception of those mentioned in the HPI and as above.  Physical Exam: Vitals:   01/08/20 0631  BP: (!) 160/89  Pulse: (!) 58  Resp: 17  Temp: 98.1 F (36.7 C)  SpO2: 100%    There is no height or weight on file to calculate BMI.  General: Alert, no acute distress Cardiovascular: No pedal edema Respiratory: No cyanosis, no use of accessory musculature Skin: No lesions in the area of chief complaint Neurologic: Sensation intact distally Psychiatric: Patient is competent for consent with normal mood and affect Lymphatic: No axillary or cervical lymphadenopathy   Assessment/Plan: MYELOPATHY SECONDARY TO MODERATE TO SEVERE SPINAL CORD COMPRESSION AT CERVICAL 3-4.  LEFT RADICULOPATHY SECONDARY TO  MODERATE TO SEVERE NEUROFORAMINAL STENOSIS SPANNING CERVICAL 3-4, CERVICAL 4-5, AND CERVICAL 5-6. CHRONIC MYOMALACIA POSTERIOR TO CERVICAL 5. Plan for Procedure(s): ANTERIOR CERVICAL DECOMPRESSION FUSION CERVICAL 3-4, CERVICAL 4-5, CERVIAL 5-6 WITH INSTRUMENTATION AND ALLOGRAFT   Norva Karvonen, MD 01/08/2020 8:08 AM

## 2020-01-08 NOTE — Transfer of Care (Signed)
Immediate Anesthesia Transfer of Care Note  Patient: Jonathan Lewis  Procedure(s) Performed: ANTERIOR CERVICAL DECOMPRESSION FUSION CERVICAL 3-4, CERVICAL 4-5, CERVIAL 5-6 WITH INSTRUMENTATION AND ALLOGRAFT (N/A )  Patient Location: PACU  Anesthesia Type:General  Level of Consciousness: drowsy  Airway & Oxygen Therapy: Patient Spontanous Breathing and Patient connected to nasal cannula oxygen  Post-op Assessment: Report given to RN, Post -op Vital signs reviewed and stable and Patient moving all extremities  Post vital signs: Reviewed and stable  Last Vitals:  Vitals Value Taken Time  BP 122/91 01/08/20 1220  Temp    Pulse 79 01/08/20 1223  Resp 22 01/08/20 1223  SpO2 100 % 01/08/20 1223  Vitals shown include unvalidated device data.  Last Pain:  Vitals:   01/08/20 0651  TempSrc:   PainSc: 5          Complications: No complications documented.

## 2020-01-08 NOTE — Op Note (Signed)
PATIENT NAME: Jonathan Lewis RECORD NO.:   [id number]    DATE OF BIRTH: March 25, 1941   DATE OF PROCEDURE: 01/08/2020                               OPERATIVE REPORT     PREOPERATIVE DIAGNOSES: 1. Left-sided cervical radiculopathy, myelopathy 2. Spinal stenosis spanning C3-C6   POSTOPERATIVE DIAGNOSES: 1. Left-sided cervical radiculopathy, myelopathy 2. Spinal stenosis spanning C3-C6   PROCEDURE: 1. Anterior cervical decompression and fusion C3/4, C4/5, C5/6 2. Placement of anterior instrumentation, C3-C6. 3. Insertion of interbody device x 3 (Titan intervertebral spacers). 4. Intraoperative use of fluoroscopy. 5. Use of morselized allograft - ViviGen.   SURGEON:  Phylliss Bob, MD   ASSISTANT:  None   ANESTHESIA:  General endotracheal anesthesia.   COMPLICATIONS:  None.   DISPOSITION:  Stable.   ESTIMATED BLOOD LOSS:  Minimal.   INDICATIONS FOR SURGERY:  Briefly, Mr. Haskin is a pleasant 79 y.o. year- old patient, who did present to me with left arm pain and b/l upper extremity weakness. The patient's MRI did reveal the findings noted above.  Given the patient's ongoing pain and lack of improvement with appropriate treatment measures, we did discuss proceeding with the procedure noted above. The patient was fully aware of the risks and limitations of surgery as outlined in my preoperative note.   OPERATIVE DETAILS:  On 01/08/2020, the patient was brought to surgery and general endotracheal anesthesia was administered.  The patient was placed supine on the hospital bed. The neck was gently extended.  All bony prominences were meticulously padded.  The neck was prepped and draped in the usual sterile fashion.  At this point, I did make a left-sided transverse incision.  The platysma was incised.  A Smith-Robinson approach was used and the anterior spine was identified. A self-retaining retractor was placed.  I then subperiosteally exposed the vertebral bodies  from C3-C6. Of note, there were very significant and profound osteophytes anteriorly across each intervertebral level. These were meticulously removed using a rongeur. Caspar pins were then placed into the C5 and C6 vertebral bodies and distraction was applied.  A thorough and complete C5-6 intervertebral diskectomy was performed.  The posterior longitudinal ligament was identified and entered using a nerve hook.  I then used #1 followed by #2 Kerrison to perform a thorough and complete intervertebral diskectomy.  The spinal canal was thoroughly decompressed, as was the right and left neuroforamen.  The endplates were then prepared and the appropriate-sized intervertebral spacer was then packed with ViviGen and tamped into position in the usual fashion.  The lower Caspar pin was then removed and placed into the C4 vertebral body and once again, distraction was applied across the C4-5 intervertebral space.  I then again performed a thorough and complete diskectomy, thoroughly decompressing the spinal canal and bilateral neuroforamena.  After preparing the endplates, the appropriate-sized intervertebral spacer was packed with ViviGen and tamped into position.  The lower Caspar pin was then removed and placed into the C3 vertebral body and once again, distraction was applied across the C3-4 intervertebral space.  I then again performed a thorough and complete diskectomy, thoroughly decompressing the spinal canal and bilateral neuroforamena.  After preparing the endplates, the appropriate-sized intervertebral spacer was packed with ViviGen and tamped into position.  The Caspar pins then were removed and bone wax was placed in their place.  The appropriate-sized anterior cervical plate  was placed over the anterior spine.  14 mm variable angle screws were placed, 2 in each vertebral body from C3-C6 for a total of 8 vertebral body screws.  The screws were then locked to the plate using the Cam  locking mechanism.  I was very pleased with the final fluoroscopic images.  The wound was then irrigated.  The wound was then explored for any undue bleeding and there was no bleeding noted. The wound was then closed in layers using 2-0 Vicryl, followed by 4-0 Monocryl.  Benzoin and Steri-Strips were applied, followed by sterile dressing.  All instrument counts were correct at the termination of the procedure.     Phylliss Bob, MD

## 2020-01-09 ENCOUNTER — Encounter (HOSPITAL_COMMUNITY): Payer: Self-pay | Admitting: Orthopedic Surgery

## 2020-01-09 DIAGNOSIS — E119 Type 2 diabetes mellitus without complications: Secondary | ICD-10-CM | POA: Diagnosis not present

## 2020-01-09 DIAGNOSIS — I10 Essential (primary) hypertension: Secondary | ICD-10-CM | POA: Diagnosis not present

## 2020-01-09 DIAGNOSIS — M4712 Other spondylosis with myelopathy, cervical region: Secondary | ICD-10-CM | POA: Diagnosis not present

## 2020-01-09 DIAGNOSIS — Z87891 Personal history of nicotine dependence: Secondary | ICD-10-CM | POA: Diagnosis not present

## 2020-01-09 DIAGNOSIS — M4802 Spinal stenosis, cervical region: Secondary | ICD-10-CM | POA: Diagnosis not present

## 2020-01-09 DIAGNOSIS — Z79899 Other long term (current) drug therapy: Secondary | ICD-10-CM | POA: Diagnosis not present

## 2020-01-09 MED FILL — Thrombin For Soln 5000 Unit: CUTANEOUS | Qty: 3 | Status: AC

## 2020-01-09 NOTE — Evaluation (Signed)
Occupational Therapy Evaluation Patient Details Name: Jonathan Lewis MRN: [id number] DOB: 12-14-1940 Today's Date: 01/09/2020    History of Present Illness 79 y.o male s/p C3-6 ACDF. PMH includes acoustic neuroma, stomach cancer with ex lap, DM2, glaucoma, HTN   Clinical Impression   PTA pt living with family, functioning at independent community level. Pt works as a Environmental education officer part time. At time of eval, pt able to complete transfers and mobility at mod I- independent level. He then engaged in UB/LB dressing with set up assist and cues for precautions. Pt demonstrated ability to mobilize household distances without use of external device. Reviewed cervical precautions with pt and how this applies to ADL routine. Reviewed specific order of Dr. Lynann Bologna of not removing hard brace for 5 days, then able to switch to shower brace for showers. Pt in understanding of precautions, needed most reinforcement to not twist spine. Pt did have emesis x1 at end of session, RN informed. All OT needs met at this time, OT will sign off in preparation for d/c. Thank you for this consult.    Follow Up Recommendations  No OT follow up    Equipment Recommendations  None recommended by OT    Recommendations for Other Services       Precautions / Restrictions Precautions Precautions: Cervical Precaution Booklet Issued: Yes (comment) Precaution Comments: reviewed throughout session Required Braces or Orthoses: Cervical Brace Cervical Brace: Hard collar;At all times (at all times for 5 days then can use shower collar) Restrictions Weight Bearing Restrictions: No      Mobility Bed Mobility Overal bed mobility: Modified Independent                Transfers Overall transfer level: Independent                    Balance Overall balance assessment: Mild deficits observed, not formally tested                                         ADL either performed or assessed with  clinical judgement   ADL                                         General ADL Comments: Pt demonstrates ability to complete ADL at mod I level this session. Pt completed toilet transfer, UB/LB dressing, and functional mobility a household distance without external assist. Reviewed all cervical precautions applicable to pts typical ADL routine     Vision Baseline Vision/History: Wears glasses Wears Glasses: At all times Patient Visual Report: No change from baseline       Perception     Praxis      Pertinent Vitals/Pain Pain Assessment: No/denies pain (numbness in bil fingertips)     Hand Dominance Left   Extremity/Trunk Assessment Upper Extremity Assessment Upper Extremity Assessment: Overall WFL for tasks assessed   Lower Extremity Assessment Lower Extremity Assessment: Overall WFL for tasks assessed       Communication Communication Communication: No difficulties   Cognition Arousal/Alertness: Awake/alert Behavior During Therapy: WFL for tasks assessed/performed Overall Cognitive Status: Within Functional Limits for tasks assessed  General Comments       Exercises     Shoulder Instructions      Home Living Family/patient expects to be discharged to:: Private residence Living Arrangements: Spouse/significant other Available Help at Discharge: Family;Available 24 hours/day Type of Home: House Home Access: Stairs to enter [employer] of Steps: 4 Entrance Stairs-Rails: Right;Left Home Layout: One level     Bathroom Shower/Tub: Teacher, early years/pre: Handicapped height     Home Equipment: None          Prior Functioning/Environment Level of Independence: Independent        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 List: Decreased knowledge of use of DME or AE;Decreased knowledge of precautions;Decreased activity tolerance;Pain      OT  Treatment/Interventions:      OT Goals(Current goals can be found in the care plan section) Acute Rehab OT Goals 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 OT Goal Formulation: All assessment and education complete, DC therapy  OT Frequency:     Barriers to D/C:            Co-evaluation              AM-PAC OT "6 Clicks" Daily Activity     Outcome Measure Help from another person eating meals?: None Help from another person taking care of personal grooming?: None Help from another person toileting, which includes using toliet, bedpan, or urinal?: None Help from another person bathing (including washing, rinsing, drying)?: None Help from another person to put on and taking off regular upper body clothing?: None Help from another person to put on and taking off regular lower body clothing?: None 6 Click Score: 24   End of Session Equipment Utilized During Treatment: Cervical collar Nurse Communication: Mobility status;Other (comment) (pt with emesis x1)  Activity Tolerance: Patient tolerated treatment well Patient left: in chair;with call bell/phone within reach  OT Visit Diagnosis: Other abnormalities of gait and mobility (R26.89);Pain Pain - part of body:  (cervical region)                Time: 5732-2567 OT Time Calculation (min): 31 min Charges:  OT General Charges $OT Visit: 1 Visit OT Evaluation $OT Eval Moderate Complexity: 1 Mod OT Treatments $Self Care/Home Management : 8-22 mins  Zenovia Jarred, MSOT, OTR/L Acute Rehabilitation Services [facility] Office Number: [phone] Pager: [phone]  Zenovia Jarred 01/09/2020, 10:19 AM

## 2020-01-09 NOTE — Progress Notes (Signed)
Patient is discharged from room 3C03 at this time. Alert and in stable condition. IV site d/c'd and instructions read to patient with understanding verbalized and all questions answered. Left unit via wheelchair with all belongings at side.

## 2020-01-09 NOTE — Progress Notes (Signed)
° ° °  Patient doing well  Denies arm pain Tolerating PO well   Physical Exam: Vitals:   01/08/20 2317 01/09/20 0414  BP: 110/73 113/77  Pulse: 70 61  Resp: 18 18  Temp: 98.3 F (36.8 C) 98.2 F (36.8 C)  SpO2: 100% 96%    Neck soft/supple Dressing in place NVI  POD #1 s/p ACDF, doing well  - encourage ambulation - Percocet for pain, Robaxin for muscle spasms - d/c home today with f/u in 2 weeks

## 2020-01-10 DIAGNOSIS — M4802 Spinal stenosis, cervical region: Secondary | ICD-10-CM | POA: Diagnosis not present

## 2020-01-10 DIAGNOSIS — Z9889 Other specified postprocedural states: Secondary | ICD-10-CM | POA: Diagnosis not present

## 2020-01-13 NOTE — Discharge Summary (Signed)
[name] MRN: [id number] DOB/AGE: [birth year] 80 y.o.  Admit date: 01/08/2020 Discharge date: 01/09/2020  Admission Diagnoses:  Active Problems:   Myelopathy [facility])   Discharge Diagnoses:  Same  Past Medical History:  Diagnosis Date   Acoustic neuroma ([place]) 06/25/2015   Allergic rhinitis 07/18/2014   Arthritis    BPH (benign prostatic hyperplasia)    Cancer ([place])    stage III stomach-Dx 03/2019   Carbuncle    recurrent MRSA carbuncles   Cataract    Diabetes mellitus    Type II   Disk prolapse    Glaucoma    Hyperlipidemia    Hypertension    Male hypogonadism 07/16/2014   Neuropathy    Pneumonia    Sinus bradycardia    chronic, asymptomatic   Sinusitis 07/29/2012    Surgeries: Procedure(s): ANTERIOR CERVICAL DECOMPRESSION FUSION CERVICAL 3-4, CERVICAL 4-5, CERVIAL 5-6 WITH INSTRUMENTATION AND ALLOGRAFT on 01/08/2020   Consultants: None  Discharged Condition: Improved  Hospital Course: Jonathan Lewis is an 79 y.o. male who was admitted 01/08/2020 for operative treatment of myelopathy. Patient has severe unremitting pain that affects sleep, daily activities, and work/hobbies. After pre-op clearance the patient was taken to the operating room on 01/08/2020 and underwent  Procedure(s): ANTERIOR CERVICAL DECOMPRESSION FUSION CERVICAL 3-4, CERVICAL 4-5, CERVIAL 5-6 WITH INSTRUMENTATION AND ALLOGRAFT.    Patient was given perioperative antibiotics:  Anti-infectives (From admission, onward)   Start     Dose/Rate Route Frequency Ordered Stop   01/08/20 1700  ceFAZolin (ANCEF) IVPB 1 g/50 mL premix        1 g 100 mL/hr over 30 Minutes Intravenous Every 8 hours 01/08/20 1339 01/09/20 0832   01/08/20 0645  ceFAZolin (ANCEF) IVPB 2g/100 mL premix        2 g 200 mL/hr over 30 Minutes Intravenous On call to O.R. 01/08/20 7408 01/08/20 0847       Patient was given sequential compression devices, early ambulation to prevent DVT.  Patient  benefited maximally from hospital stay and there were no complications.    Recent vital signs: BP 109/79 (BP Location: Right Arm)    Pulse 65    Temp 98.1 F (36.7 C) (Oral)    Resp 16    Ht 5\' 8"  (1.727 m)    Wt 84.2 kg    SpO2 100%    BMI 28.22 kg/m    Discharge Medications:   Allergies as of 01/09/2020   No Known Allergies     Medication List    STOP taking these medications   acetaminophen 325 MG tablet Commonly known as: Tylenol     TAKE these medications   docusate sodium 100 MG capsule Commonly known as: COLACE Take 1 capsule (100 mg total) by mouth daily.   famotidine 20 MG tablet Commonly known as: PEPCID Take 1 tablet (20 mg total) by mouth daily. What changed: when to take this   gabapentin 100 MG capsule Commonly known as: NEURONTIN Take 1 capsule (100 mg total) by mouth every 12 (twelve) hours. What changed:   when to take this  reasons to take this   latanoprost 0.005 % ophthalmic solution Commonly known as: XALATAN Place 1 drop into both eyes at bedtime.   lidocaine-prilocaine cream Commonly known as: EMLA Apply 1 application topically as needed. Place on the port one hour before appointment.   methocarbamol 500 MG tablet Commonly known as: ROBAXIN Take 1 tablet (500 mg total) by mouth every 6 (six)  hours as needed for muscle spasms.   multivitamin with minerals Tabs tablet Take 1 tablet by mouth daily.   oxyCODONE-acetaminophen 5-325 MG tablet Commonly known as: PERCOCET/ROXICET Take 1-2 tablets by mouth every 4 (four) hours as needed for moderate pain or severe pain.   Systane Ultra 0.4-0.3 % Soln Generic drug: Polyethyl Glycol-Propyl Glycol Apply 1 drop to eye 3 (three) times daily as needed (dry eyes).       Diagnostic Studies: DG Cervical Spine 2-3 Views  Result Date: 01/08/2020 CLINICAL DATA:  Anterior cervical decompression fusion EXAM: CERVICAL SPINE - 2-3 VIEW; DG C-ARM 1-60 MIN COMPARISON:  MRI 10/08/2019 FINDINGS: Single low  resolution fluoroscopic lateral image of the cervical spine. Total fluoroscopy time was 14 seconds. The image demonstrates anterior plate and screw fixation C3 through C6 with interbody devices at C3-C4, C4-C5 and C5-C6. IMPRESSION: Intraoperative fluoroscopic assistance provided during cervical spine surgery. Electronically Signed   By: Donavan Foil M.D.   On: 01/08/2020 15:10   DG C-Arm 1-60 Min  Result Date: 01/08/2020 CLINICAL DATA:  Anterior cervical decompression fusion EXAM: CERVICAL SPINE - 2-3 VIEW; DG C-ARM 1-60 MIN COMPARISON:  MRI 10/08/2019 FINDINGS: Single low resolution fluoroscopic lateral image of the cervical spine. Total fluoroscopy time was 14 seconds. The image demonstrates anterior plate and screw fixation C3 through C6 with interbody devices at C3-C4, C4-C5 and C5-C6. IMPRESSION: Intraoperative fluoroscopic assistance provided during cervical spine surgery. Electronically Signed   By: Donavan Foil M.D.   On: 01/08/2020 15:10    Disposition: Discharge disposition: 01-Home or Self Care        POD #1 s/p ACDF, doing well  - encourage ambulation - Percocet for pain, Robaxin for muscle spasms -Scripts for pain sent to pharmacy electronically  -D/C instructions sheet printed and in chart -D/C today  -F/U in office 2 weeks   Signed: Lennie Muckle Cadyn Rodger 01/13/2020, 10:05 AM

## 2020-01-22 ENCOUNTER — Other Ambulatory Visit: Payer: Self-pay | Admitting: Hematology & Oncology

## 2020-01-22 DIAGNOSIS — Z9889 Other specified postprocedural states: Secondary | ICD-10-CM | POA: Diagnosis not present

## 2020-01-22 DIAGNOSIS — M542 Cervicalgia: Secondary | ICD-10-CM | POA: Diagnosis not present

## 2020-01-22 DIAGNOSIS — C162 Malignant neoplasm of body of stomach: Secondary | ICD-10-CM

## 2020-01-31 DIAGNOSIS — M4322 Fusion of spine, cervical region: Secondary | ICD-10-CM | POA: Diagnosis not present

## 2020-01-31 DIAGNOSIS — Z981 Arthrodesis status: Secondary | ICD-10-CM | POA: Diagnosis not present

## 2020-02-03 DIAGNOSIS — E440 Moderate protein-calorie malnutrition: Secondary | ICD-10-CM | POA: Diagnosis not present

## 2020-02-03 DIAGNOSIS — C169 Malignant neoplasm of stomach, unspecified: Secondary | ICD-10-CM | POA: Diagnosis not present

## 2020-02-17 ENCOUNTER — Other Ambulatory Visit: Payer: Self-pay | Admitting: *Deleted

## 2020-02-17 DIAGNOSIS — C162 Malignant neoplasm of body of stomach: Secondary | ICD-10-CM

## 2020-02-17 DIAGNOSIS — Z95828 Presence of other vascular implants and grafts: Secondary | ICD-10-CM

## 2020-02-18 ENCOUNTER — Inpatient Hospital Stay (HOSPITAL_BASED_OUTPATIENT_CLINIC_OR_DEPARTMENT_OTHER): Payer: Medicare PPO | Admitting: Hematology & Oncology

## 2020-02-18 ENCOUNTER — Inpatient Hospital Stay: Payer: Medicare PPO | Attending: Hematology & Oncology

## 2020-02-18 ENCOUNTER — Telehealth: Payer: Self-pay

## 2020-02-18 ENCOUNTER — Ambulatory Visit (HOSPITAL_BASED_OUTPATIENT_CLINIC_OR_DEPARTMENT_OTHER)
Admission: RE | Admit: 2020-02-18 | Discharge: 2020-02-18 | Disposition: A | Discharge status: Home / Self Care | Payer: Medicare PPO | Source: Ambulatory Visit | Attending: Hematology & Oncology | Admitting: Hematology & Oncology

## 2020-02-18 ENCOUNTER — Encounter: Payer: Self-pay | Admitting: Hematology & Oncology

## 2020-02-18 ENCOUNTER — Encounter (HOSPITAL_BASED_OUTPATIENT_CLINIC_OR_DEPARTMENT_OTHER): Payer: Self-pay

## 2020-02-18 ENCOUNTER — Other Ambulatory Visit: Payer: Self-pay

## 2020-02-18 ENCOUNTER — Inpatient Hospital Stay: Payer: Medicare PPO

## 2020-02-18 VITALS — BP 126/76 | HR 61 | Temp 98.1°F | Resp 18

## 2020-02-18 VITALS — BP 126/76 | HR 61 | Temp 98.7°F | Resp 18 | Wt 181.5 lb

## 2020-02-18 DIAGNOSIS — K429 Umbilical hernia without obstruction or gangrene: Secondary | ICD-10-CM | POA: Diagnosis not present

## 2020-02-18 DIAGNOSIS — Z95828 Presence of other vascular implants and grafts: Secondary | ICD-10-CM

## 2020-02-18 DIAGNOSIS — C169 Malignant neoplasm of stomach, unspecified: Secondary | ICD-10-CM | POA: Insufficient documentation

## 2020-02-18 DIAGNOSIS — C162 Malignant neoplasm of body of stomach: Secondary | ICD-10-CM

## 2020-02-18 DIAGNOSIS — N200 Calculus of kidney: Secondary | ICD-10-CM | POA: Diagnosis not present

## 2020-02-18 DIAGNOSIS — C161 Malignant neoplasm of fundus of stomach: Secondary | ICD-10-CM

## 2020-02-18 DIAGNOSIS — Z9221 Personal history of antineoplastic chemotherapy: Secondary | ICD-10-CM | POA: Insufficient documentation

## 2020-02-18 DIAGNOSIS — Z79899 Other long term (current) drug therapy: Secondary | ICD-10-CM | POA: Insufficient documentation

## 2020-02-18 DIAGNOSIS — Z85028 Personal history of other malignant neoplasm of stomach: Secondary | ICD-10-CM | POA: Diagnosis not present

## 2020-02-18 DIAGNOSIS — M79622 Pain in left upper arm: Secondary | ICD-10-CM | POA: Insufficient documentation

## 2020-02-18 DIAGNOSIS — K4090 Unilateral inguinal hernia, without obstruction or gangrene, not specified as recurrent: Secondary | ICD-10-CM | POA: Diagnosis not present

## 2020-02-18 DIAGNOSIS — J439 Emphysema, unspecified: Secondary | ICD-10-CM | POA: Diagnosis not present

## 2020-02-18 DIAGNOSIS — M25512 Pain in left shoulder: Secondary | ICD-10-CM | POA: Insufficient documentation

## 2020-02-18 DIAGNOSIS — I2510 Atherosclerotic heart disease of native coronary artery without angina pectoris: Secondary | ICD-10-CM | POA: Diagnosis not present

## 2020-02-18 DIAGNOSIS — J9811 Atelectasis: Secondary | ICD-10-CM | POA: Diagnosis not present

## 2020-02-18 LAB — CMP (CANCER CENTER ONLY)
ALT: 11 U/L (ref 0–44)
AST: 18 U/L (ref 15–41)
Albumin: 3.7 g/dL (ref 3.5–5.0)
Alkaline Phosphatase: 58 U/L (ref 38–126)
Anion gap: 5 (ref 5–15)
BUN: 15 mg/dL (ref 8–23)
CO2: 28 mmol/L (ref 22–32)
Calcium: 9.3 mg/dL (ref 8.9–10.3)
Chloride: 107 mmol/L (ref 98–111)
Creatinine: 0.9 mg/dL (ref 0.61–1.24)
GFR, Estimated: >60 mL/min (ref >60)
Glucose, Bld: 103 mg/dL — ABNORMAL HIGH (ref 70–99)
Potassium: 3.9 mmol/L (ref 3.5–5.1)
Sodium: 140 mmol/L (ref 135–145)
Total Bilirubin: 0.4 mg/dL (ref 0.3–1.2)
Total Protein: 6.4 g/dL — ABNORMAL LOW (ref 6.5–8.1)

## 2020-02-18 LAB — CBC WITH DIFFERENTIAL (CANCER CENTER ONLY)
Abs Immature Granulocytes: 0 10*3/uL (ref 0.00–0.07)
Basophils Absolute: 0 10*3/uL (ref 0.0–0.1)
Basophils Relative: 0 %
Eosinophils Absolute: 0 10*3/uL (ref 0.0–0.5)
Eosinophils Relative: 1 %
HCT: 35.2 % — ABNORMAL LOW (ref 39.0–52.0)
Hemoglobin: 11.4 g/dL — ABNORMAL LOW (ref 13.0–17.0)
Immature Granulocytes: 0 %
Lymphocytes Relative: 46 %
Lymphs Abs: 1.3 10*3/uL (ref 0.7–4.0)
MCH: 28.6 pg (ref 26.0–34.0)
MCHC: 32.4 g/dL (ref 30.0–36.0)
MCV: 88.2 fL (ref 80.0–100.0)
Monocytes Absolute: 0.4 10*3/uL (ref 0.1–1.0)
Monocytes Relative: 14 %
Neutro Abs: 1.1 10*3/uL — ABNORMAL LOW (ref 1.7–7.7)
Neutrophils Relative %: 39 %
Platelet Count: 174 10*3/uL (ref 150–400)
RBC: 3.99 MIL/uL — ABNORMAL LOW (ref 4.22–5.81)
RDW: 12.7 % (ref 11.5–15.5)
WBC Count: 2.9 10*3/uL — ABNORMAL LOW (ref 4.0–10.5)
nRBC: 0 % (ref 0.0–0.2)

## 2020-02-18 MED ORDER — SODIUM CHLORIDE 0.9% FLUSH
10.0000 mL | Freq: Once | INTRAVENOUS | Status: AC
  Administered 2020-02-18: 10 mL
  Filled 2020-02-18: qty 10

## 2020-02-18 MED ORDER — IOHEXOL 300 MG/ML  SOLN
100.0000 mL | Freq: Once | INTRAMUSCULAR | Status: AC | PRN
  Administered 2020-02-18: 100 mL via INTRAVENOUS

## 2020-02-18 MED ORDER — HEPARIN SOD (PORK) LOCK FLUSH 100 UNIT/ML IV SOLN
500.0000 [IU] | Freq: Once | INTRAVENOUS | Status: AC
  Administered 2020-02-18: 500 [IU] via INTRAVENOUS
  Filled 2020-02-18: qty 5

## 2020-02-18 NOTE — Patient Instructions (Signed)

## 2020-02-18 NOTE — Telephone Encounter (Signed)
appts made and printed for pt per 02/18/20 los   AOM

## 2020-02-18 NOTE — Progress Notes (Signed)
Hematology and Oncology Follow Up Visit  LANDO ALCALDE [id number] May 14, 1940 79 y.o. 02/18/2020   Principle Diagnosis:  Stage IIIA (T3N2M0) adenocarcinoma of the stomach -- S/p distal gastrectomy on 03/19/2019 -- HER2-/PD-L1+  Current Therapy: Adjuvant FOLFOX -started on 05/13/2019, s/p cycle#8 -completed on Dec 11, 2019   Interim History:  Mr. Isenhower is here today for follow-up.  He did have a neck surgery.  This was back in early October.  He still has some pain in the left shoulder and left upper arm.  This is might be some nerve irritation.  He sees the orthopedic surgeon on Friday.  We did do eyes CT scan done today.  The CT scan did not show any evidence of residual or recurrent gastric cancer.  Everything looked fine with respect to the gastric cancer.  He is eating well.  He has had no problems with nausea or vomiting.  He has had no change in bowel or bladder habits.  He has had no bleeding.  There is been no rashes.  He has had no leg swelling.  He has had no fever.  He has had no headache.  I am sure that he will have a wonderful Thanksgiving with his family.  Overall, his performance status is ECOG 1.   Medications:  Allergies as of 02/18/2020   No Known Allergies     Medication List       Accurate as of February 18, 2020 12:33 PM. If you have any questions, ask your nurse or doctor.        docusate sodium 100 MG capsule Commonly known as: COLACE TAKE 1 CAPSULE BY MOUTH EVERY DAY   famotidine 20 MG tablet Commonly known as: PEPCID Take 1 tablet (20 mg total) by mouth daily. What changed: when to take this   gabapentin 100 MG capsule Commonly known as: NEURONTIN Take 1 capsule (100 mg total) by mouth every 12 (twelve) hours. What changed:   when to take this  reasons to take this   latanoprost 0.005 % ophthalmic solution Commonly known as: XALATAN Place 1 drop into both eyes at bedtime.   lidocaine-prilocaine cream Commonly known  as: EMLA Apply 1 application topically as needed. Place on the port one hour before appointment.   methocarbamol 500 MG tablet Commonly known as: ROBAXIN Take 1 tablet (500 mg total) by mouth every 6 (six) hours as needed for muscle spasms.   multivitamin with minerals Tabs tablet Take 1 tablet by mouth daily.   oxyCODONE-acetaminophen 5-325 MG tablet Commonly known as: PERCOCET/ROXICET Take 1-2 tablets by mouth every 4 (four) hours as needed for moderate pain or severe pain.   Systane Ultra 0.4-0.3 % Soln Generic drug: Polyethyl Glycol-Propyl Glycol Apply 1 drop to eye 3 (three) times daily as needed (dry eyes).       Allergies: No Known Allergies  Past Medical History, Surgical history, Social history, and Family History were reviewed and updated.  Review of Systems: Review of Systems  Constitutional: Negative.   HENT: Negative.   Eyes: Negative.   Respiratory: Negative.   Cardiovascular: Negative.   Gastrointestinal: Positive for abdominal pain.  Genitourinary: Negative.   Musculoskeletal: Negative.   Skin: Negative.   Neurological: Positive for tingling.  Endo/Heme/Allergies: Negative.   Psychiatric/Behavioral: Negative.      Physical Exam:  weight is 181 lb 8 oz (82.3 kg). His oral temperature is 98.7 F (37.1 C). His blood pressure is 126/76 and his pulse is 61. His respiration is 18 and  oxygen saturation is 100%.   Wt Readings from Last 3 Encounters:  02/18/20 181 lb 8 oz (82.3 kg)  01/09/20 185 lb 9.6 oz (84.2 kg)  01/06/20 185 lb 9.6 oz (84.2 kg)      Physical Activity:   . Days of Exercise per Week: Not on file  . Minutes of Exercise per Session: Not on file    Physical Exam Vitals reviewed.  HENT:     Head: Normocephalic and atraumatic.  Eyes:     Pupils: Pupils are equal, round, and reactive to light.  Cardiovascular:     Rate and Rhythm: Normal rate and regular rhythm.     Heart sounds: Normal heart sounds.  Pulmonary:     Effort:  Pulmonary effort is normal.     Breath sounds: Normal breath sounds.  Abdominal:     General: Bowel sounds are normal.     Palpations: Abdomen is soft.     Comments: Abdominal exam is soft.  He has well-healed laparotomy scars.  There is no tenderness to palpation.  He has no guarding or rebound tenderness.  There is no fluid wave.  There is no palpable liver or spleen tip.  Musculoskeletal:        General: No tenderness or deformity. Normal range of motion.     Cervical back: Normal range of motion.  Lymphadenopathy:     Cervical: No cervical adenopathy.  Skin:    General: Skin is warm and dry.     Findings: No erythema or rash.  Neurological:     Mental Status: He is alert and oriented to person, place, and time.  Psychiatric:        Behavior: Behavior normal.        Thought Content: Thought content normal.        Judgment: Judgment normal.     Lab Results  Component Value Date   WBC 2.9 (L) 02/18/2020   HGB 11.4 (L) 02/18/2020   HCT 35.2 (L) 02/18/2020   MCV 88.2 02/18/2020   PLT 174 02/18/2020   Lab Results  Component Value Date   FERRITIN 37 09/25/2019   IRON 98 09/25/2019   TIBC 328 09/25/2019   UIBC 230 09/25/2019   IRONPCTSAT 30 09/25/2019   Lab Results  Component Value Date   RBC 3.99 (L) 02/18/2020   No results found for: KPAFRELGTCHN, LAMBDASER, KAPLAMBRATIO No results found for: IGGSERUM, IGA, IGMSERUM No results found for: Ronnald Ramp, A1GS, A2GS, Violet Baldy, MSPIKE, SPEI   Chemistry      Component Value Date/Time   NA 140 02/18/2020 1025   K 3.9 02/18/2020 1025   CL 107 02/18/2020 1025   CO2 28 02/18/2020 1025   BUN 15 02/18/2020 1025   CREATININE 0.90 02/18/2020 1025   CREATININE 1.06 07/16/2012 1038      Component Value Date/Time   CALCIUM 9.3 02/18/2020 1025   ALKPHOS 58 02/18/2020 1025   AST 18 02/18/2020 1025   ALT 11 02/18/2020 1025   BILITOT 0.4 02/18/2020 1025       Impression and Plan: Mr. Ausmus is a  very pleasant 79 yo African American gentleman with stage IIIA (T3N2M0) adenocarcinoma of the stomach, HER2-/PD-L1+. He had a distal gastrectomy on 12/15/2020andis currently receiving FOLFOX.   I am so happy that the scans look good.  For right now, we will just plan to get him back in 4 months.  We will do scans when we see him back.  I suppose that he  is at risk for recurrent disease.  I just do not see that we have to keep treating him at this point.  I do not see that we have to give radiation therapy.      Volanda Napoleon, MD 11/16/202112:33 PM

## 2020-02-19 LAB — CEA (IN HOUSE-CHCC): CEA (CHCC-In House): <1 ng/mL (ref 0.00–5.00)

## 2020-02-21 DIAGNOSIS — M542 Cervicalgia: Secondary | ICD-10-CM | POA: Diagnosis not present

## 2020-02-21 DIAGNOSIS — Z9889 Other specified postprocedural states: Secondary | ICD-10-CM | POA: Diagnosis not present

## 2020-02-25 DIAGNOSIS — M25611 Stiffness of right shoulder, not elsewhere classified: Secondary | ICD-10-CM | POA: Diagnosis not present

## 2020-02-25 DIAGNOSIS — M5013 Cervical disc disorder with radiculopathy, cervicothoracic region: Secondary | ICD-10-CM | POA: Diagnosis not present

## 2020-02-25 DIAGNOSIS — M5000 Cervical disc disorder with myelopathy, unspecified cervical region: Secondary | ICD-10-CM | POA: Diagnosis not present

## 2020-02-25 DIAGNOSIS — Z9889 Other specified postprocedural states: Secondary | ICD-10-CM | POA: Diagnosis not present

## 2020-03-04 DIAGNOSIS — M5000 Cervical disc disorder with myelopathy, unspecified cervical region: Secondary | ICD-10-CM | POA: Diagnosis not present

## 2020-03-04 DIAGNOSIS — M5013 Cervical disc disorder with radiculopathy, cervicothoracic region: Secondary | ICD-10-CM | POA: Diagnosis not present

## 2020-03-04 DIAGNOSIS — Z9889 Other specified postprocedural states: Secondary | ICD-10-CM | POA: Diagnosis not present

## 2020-03-04 DIAGNOSIS — M25611 Stiffness of right shoulder, not elsewhere classified: Secondary | ICD-10-CM | POA: Diagnosis not present

## 2020-03-04 DIAGNOSIS — C169 Malignant neoplasm of stomach, unspecified: Secondary | ICD-10-CM | POA: Diagnosis not present

## 2020-03-04 DIAGNOSIS — E440 Moderate protein-calorie malnutrition: Secondary | ICD-10-CM | POA: Diagnosis not present

## 2020-03-06 DIAGNOSIS — M5000 Cervical disc disorder with myelopathy, unspecified cervical region: Secondary | ICD-10-CM | POA: Diagnosis not present

## 2020-03-06 DIAGNOSIS — Z9889 Other specified postprocedural states: Secondary | ICD-10-CM | POA: Diagnosis not present

## 2020-03-06 DIAGNOSIS — M5013 Cervical disc disorder with radiculopathy, cervicothoracic region: Secondary | ICD-10-CM | POA: Diagnosis not present

## 2020-03-06 DIAGNOSIS — M25611 Stiffness of right shoulder, not elsewhere classified: Secondary | ICD-10-CM | POA: Diagnosis not present

## 2020-03-10 ENCOUNTER — Ambulatory Visit: Payer: Medicare PPO | Attending: Internal Medicine

## 2020-03-10 DIAGNOSIS — M5000 Cervical disc disorder with myelopathy, unspecified cervical region: Secondary | ICD-10-CM | POA: Diagnosis not present

## 2020-03-10 DIAGNOSIS — Z9889 Other specified postprocedural states: Secondary | ICD-10-CM | POA: Diagnosis not present

## 2020-03-10 DIAGNOSIS — M25612 Stiffness of left shoulder, not elsewhere classified: Secondary | ICD-10-CM | POA: Diagnosis not present

## 2020-03-10 DIAGNOSIS — Z23 Encounter for immunization: Secondary | ICD-10-CM

## 2020-03-10 DIAGNOSIS — M5013 Cervical disc disorder with radiculopathy, cervicothoracic region: Secondary | ICD-10-CM | POA: Diagnosis not present

## 2020-03-10 NOTE — Progress Notes (Signed)
   Covid-19 Vaccination Clinic  Name:  Jonathan Lewis    MRN: [id number] DOB: 06/06/1940  03/10/2020  Jonathan Lewis was observed post Covid-19 immunization for 15 minutes without incident. He was provided with Vaccine Information Sheet and instruction to access the V-Safe system.   Jonathan Lewis was instructed to call 911 with any severe reactions post vaccine: Marland Kitchen Difficulty breathing  . Swelling of face and throat  . A fast heartbeat  . A bad rash all over body  . Dizziness and weakness   Immunizations Administered    Name Date Dose VIS Date Route   Pfizer COVID-19 Vaccine 03/10/2020  1:23 PM 0.3 mL 01/22/2020 Intramuscular   Manufacturer: [place]   Lot: [id number]   [place]: [id number]

## 2020-03-12 DIAGNOSIS — M5000 Cervical disc disorder with myelopathy, unspecified cervical region: Secondary | ICD-10-CM | POA: Diagnosis not present

## 2020-03-12 DIAGNOSIS — M25611 Stiffness of right shoulder, not elsewhere classified: Secondary | ICD-10-CM | POA: Diagnosis not present

## 2020-03-12 DIAGNOSIS — M5013 Cervical disc disorder with radiculopathy, cervicothoracic region: Secondary | ICD-10-CM | POA: Diagnosis not present

## 2020-03-12 DIAGNOSIS — Z9889 Other specified postprocedural states: Secondary | ICD-10-CM | POA: Diagnosis not present

## 2020-03-19 ENCOUNTER — Ambulatory Visit: Payer: Medicare PPO | Admitting: Internal Medicine

## 2020-03-19 DIAGNOSIS — M5000 Cervical disc disorder with myelopathy, unspecified cervical region: Secondary | ICD-10-CM | POA: Diagnosis not present

## 2020-03-19 DIAGNOSIS — M5013 Cervical disc disorder with radiculopathy, cervicothoracic region: Secondary | ICD-10-CM | POA: Diagnosis not present

## 2020-03-19 DIAGNOSIS — Z9889 Other specified postprocedural states: Secondary | ICD-10-CM | POA: Diagnosis not present

## 2020-03-19 DIAGNOSIS — Z0289 Encounter for other administrative examinations: Secondary | ICD-10-CM

## 2020-03-19 DIAGNOSIS — M25611 Stiffness of right shoulder, not elsewhere classified: Secondary | ICD-10-CM | POA: Diagnosis not present

## 2020-03-20 DIAGNOSIS — H401231 Low-tension glaucoma, bilateral, mild stage: Secondary | ICD-10-CM | POA: Diagnosis not present

## 2020-03-20 DIAGNOSIS — H35371 Puckering of macula, right eye: Secondary | ICD-10-CM | POA: Diagnosis not present

## 2020-03-20 DIAGNOSIS — H04123 Dry eye syndrome of bilateral lacrimal glands: Secondary | ICD-10-CM | POA: Diagnosis not present

## 2020-03-20 DIAGNOSIS — E119 Type 2 diabetes mellitus without complications: Secondary | ICD-10-CM | POA: Diagnosis not present

## 2020-03-20 DIAGNOSIS — H353131 Nonexudative age-related macular degeneration, bilateral, early dry stage: Secondary | ICD-10-CM | POA: Diagnosis not present

## 2020-03-20 DIAGNOSIS — H35033 Hypertensive retinopathy, bilateral: Secondary | ICD-10-CM | POA: Diagnosis not present

## 2020-03-24 DIAGNOSIS — M25611 Stiffness of right shoulder, not elsewhere classified: Secondary | ICD-10-CM | POA: Diagnosis not present

## 2020-03-24 DIAGNOSIS — M5000 Cervical disc disorder with myelopathy, unspecified cervical region: Secondary | ICD-10-CM | POA: Diagnosis not present

## 2020-03-24 DIAGNOSIS — Z9889 Other specified postprocedural states: Secondary | ICD-10-CM | POA: Diagnosis not present

## 2020-03-24 DIAGNOSIS — M5013 Cervical disc disorder with radiculopathy, cervicothoracic region: Secondary | ICD-10-CM | POA: Diagnosis not present

## 2020-03-30 DIAGNOSIS — Z981 Arthrodesis status: Secondary | ICD-10-CM | POA: Diagnosis not present

## 2020-03-30 DIAGNOSIS — Z4789 Encounter for other orthopedic aftercare: Secondary | ICD-10-CM | POA: Diagnosis not present

## 2020-04-01 DIAGNOSIS — M5013 Cervical disc disorder with radiculopathy, cervicothoracic region: Secondary | ICD-10-CM | POA: Diagnosis not present

## 2020-04-01 DIAGNOSIS — M5000 Cervical disc disorder with myelopathy, unspecified cervical region: Secondary | ICD-10-CM | POA: Diagnosis not present

## 2020-04-01 DIAGNOSIS — Z9889 Other specified postprocedural states: Secondary | ICD-10-CM | POA: Diagnosis not present

## 2020-04-01 DIAGNOSIS — M25611 Stiffness of right shoulder, not elsewhere classified: Secondary | ICD-10-CM | POA: Diagnosis not present

## 2020-04-04 DIAGNOSIS — E440 Moderate protein-calorie malnutrition: Secondary | ICD-10-CM | POA: Diagnosis not present

## 2020-04-04 DIAGNOSIS — C169 Malignant neoplasm of stomach, unspecified: Secondary | ICD-10-CM | POA: Diagnosis not present

## 2020-04-07 DIAGNOSIS — M5000 Cervical disc disorder with myelopathy, unspecified cervical region: Secondary | ICD-10-CM | POA: Diagnosis not present

## 2020-04-07 DIAGNOSIS — M25611 Stiffness of right shoulder, not elsewhere classified: Secondary | ICD-10-CM | POA: Diagnosis not present

## 2020-04-07 DIAGNOSIS — Z9889 Other specified postprocedural states: Secondary | ICD-10-CM | POA: Diagnosis not present

## 2020-04-07 DIAGNOSIS — M5013 Cervical disc disorder with radiculopathy, cervicothoracic region: Secondary | ICD-10-CM | POA: Diagnosis not present

## 2020-04-14 DIAGNOSIS — M5000 Cervical disc disorder with myelopathy, unspecified cervical region: Secondary | ICD-10-CM | POA: Diagnosis not present

## 2020-04-14 DIAGNOSIS — M25611 Stiffness of right shoulder, not elsewhere classified: Secondary | ICD-10-CM | POA: Diagnosis not present

## 2020-04-14 DIAGNOSIS — M5013 Cervical disc disorder with radiculopathy, cervicothoracic region: Secondary | ICD-10-CM | POA: Diagnosis not present

## 2020-04-14 DIAGNOSIS — Z9889 Other specified postprocedural states: Secondary | ICD-10-CM | POA: Diagnosis not present

## 2020-04-16 DIAGNOSIS — M5000 Cervical disc disorder with myelopathy, unspecified cervical region: Secondary | ICD-10-CM | POA: Diagnosis not present

## 2020-04-16 DIAGNOSIS — M5013 Cervical disc disorder with radiculopathy, cervicothoracic region: Secondary | ICD-10-CM | POA: Diagnosis not present

## 2020-04-16 DIAGNOSIS — M25611 Stiffness of right shoulder, not elsewhere classified: Secondary | ICD-10-CM | POA: Diagnosis not present

## 2020-04-16 DIAGNOSIS — Z9889 Other specified postprocedural states: Secondary | ICD-10-CM | POA: Diagnosis not present

## 2020-04-20 ENCOUNTER — Ambulatory Visit: Payer: Medicare PPO | Admitting: Internal Medicine

## 2020-04-21 ENCOUNTER — Inpatient Hospital Stay: Payer: Medicare PPO | Attending: Hematology & Oncology

## 2020-04-21 ENCOUNTER — Other Ambulatory Visit: Payer: Self-pay

## 2020-04-21 VITALS — BP 111/65 | HR 71 | Temp 98.3°F | Resp 17

## 2020-04-21 DIAGNOSIS — C169 Malignant neoplasm of stomach, unspecified: Secondary | ICD-10-CM | POA: Insufficient documentation

## 2020-04-21 DIAGNOSIS — C162 Malignant neoplasm of body of stomach: Secondary | ICD-10-CM

## 2020-04-21 DIAGNOSIS — Z452 Encounter for adjustment and management of vascular access device: Secondary | ICD-10-CM | POA: Insufficient documentation

## 2020-04-21 DIAGNOSIS — E440 Moderate protein-calorie malnutrition: Secondary | ICD-10-CM

## 2020-04-21 DIAGNOSIS — D509 Iron deficiency anemia, unspecified: Secondary | ICD-10-CM

## 2020-04-21 DIAGNOSIS — K921 Melena: Secondary | ICD-10-CM

## 2020-04-21 MED ORDER — SODIUM CHLORIDE 0.9% FLUSH
10.0000 mL | Freq: Once | INTRAVENOUS | Status: AC
  Administered 2020-04-21: 10 mL via INTRAVENOUS
  Filled 2020-04-21: qty 10

## 2020-04-21 MED ORDER — HEPARIN SOD (PORK) LOCK FLUSH 100 UNIT/ML IV SOLN
500.0000 [IU] | Freq: Once | INTRAVENOUS | Status: AC | PRN
  Administered 2020-04-21: 500 [IU]
  Filled 2020-04-21: qty 5

## 2020-04-21 NOTE — Patient Instructions (Signed)

## 2020-04-22 ENCOUNTER — Encounter: Payer: Self-pay | Admitting: Internal Medicine

## 2020-04-22 ENCOUNTER — Ambulatory Visit: Payer: Medicare PPO | Admitting: Internal Medicine

## 2020-04-22 VITALS — BP 118/74 | HR 75 | Temp 98.3°F | Ht 68.0 in | Wt 184.0 lb

## 2020-04-22 DIAGNOSIS — M25561 Pain in right knee: Secondary | ICD-10-CM

## 2020-04-22 DIAGNOSIS — M5013 Cervical disc disorder with radiculopathy, cervicothoracic region: Secondary | ICD-10-CM | POA: Diagnosis not present

## 2020-04-22 DIAGNOSIS — Z0001 Encounter for general adult medical examination with abnormal findings: Secondary | ICD-10-CM

## 2020-04-22 DIAGNOSIS — E559 Vitamin D deficiency, unspecified: Secondary | ICD-10-CM

## 2020-04-22 DIAGNOSIS — M5000 Cervical disc disorder with myelopathy, unspecified cervical region: Secondary | ICD-10-CM | POA: Diagnosis not present

## 2020-04-22 DIAGNOSIS — E785 Hyperlipidemia, unspecified: Secondary | ICD-10-CM

## 2020-04-22 DIAGNOSIS — E1165 Type 2 diabetes mellitus with hyperglycemia: Secondary | ICD-10-CM

## 2020-04-22 DIAGNOSIS — E1159 Type 2 diabetes mellitus with other circulatory complications: Secondary | ICD-10-CM

## 2020-04-22 DIAGNOSIS — G8929 Other chronic pain: Secondary | ICD-10-CM

## 2020-04-22 DIAGNOSIS — M25611 Stiffness of right shoulder, not elsewhere classified: Secondary | ICD-10-CM | POA: Diagnosis not present

## 2020-04-22 DIAGNOSIS — E538 Deficiency of other specified B group vitamins: Secondary | ICD-10-CM | POA: Diagnosis not present

## 2020-04-22 DIAGNOSIS — I152 Hypertension secondary to endocrine disorders: Secondary | ICD-10-CM | POA: Diagnosis not present

## 2020-04-22 DIAGNOSIS — Z9889 Other specified postprocedural states: Secondary | ICD-10-CM | POA: Diagnosis not present

## 2020-04-22 LAB — CBC WITH DIFFERENTIAL/PLATELET
Basophils Absolute: 0 10*3/uL (ref 0.0–0.1)
Basophils Relative: 1.3 % (ref 0.0–3.0)
Eosinophils Absolute: 0.1 10*3/uL (ref 0.0–0.7)
Eosinophils Relative: 1.8 % (ref 0.0–5.0)
HCT: 38.4 % — ABNORMAL LOW (ref 39.0–52.0)
Hemoglobin: 12.7 g/dL — ABNORMAL LOW (ref 13.0–17.0)
Lymphocytes Relative: 48.9 % — ABNORMAL HIGH (ref 12.0–46.0)
Lymphs Abs: 1.7 10*3/uL (ref 0.7–4.0)
MCHC: 33 g/dL (ref 30.0–36.0)
MCV: 86.2 fl (ref 78.0–100.0)
Monocytes Absolute: 0.4 10*3/uL (ref 0.1–1.0)
Monocytes Relative: 12.8 % — ABNORMAL HIGH (ref 3.0–12.0)
Neutro Abs: 1.2 10*3/uL — ABNORMAL LOW (ref 1.4–7.7)
Neutrophils Relative %: 35.2 % — ABNORMAL LOW (ref 43.0–77.0)
Platelets: 187 10*3/uL (ref 150.0–400.0)
RBC: 4.45 Mil/uL (ref 4.22–5.81)
RDW: 13.3 % (ref 11.5–15.5)
WBC: 3.5 10*3/uL — ABNORMAL LOW (ref 4.0–10.5)

## 2020-04-22 LAB — HEPATIC FUNCTION PANEL
ALT: 17 U/L (ref 0–53)
AST: 22 U/L (ref 0–37)
Albumin: 4.3 g/dL (ref 3.5–5.2)
Alkaline Phosphatase: 67 U/L (ref 39–117)
Bilirubin, Direct: 0.1 mg/dL (ref 0.0–0.3)
Total Bilirubin: 0.5 mg/dL (ref 0.2–1.2)
Total Protein: 7 g/dL (ref 6.0–8.3)

## 2020-04-22 LAB — BASIC METABOLIC PANEL
BUN: 13 mg/dL (ref 6–23)
CO2: 31 mEq/L (ref 19–32)
Calcium: 9.6 mg/dL (ref 8.4–10.5)
Chloride: 104 mEq/L (ref 96–112)
Creatinine, Ser: 0.86 mg/dL (ref 0.40–1.50)
GFR: 81.62 mL/min (ref >60.00)
Glucose, Bld: 107 mg/dL — ABNORMAL HIGH (ref 70–99)
Potassium: 4.3 mEq/L (ref 3.5–5.1)
Sodium: 139 mEq/L (ref 135–145)

## 2020-04-22 LAB — MICROALBUMIN / CREATININE URINE RATIO
Creatinine,U: 109.5 mg/dL
Microalb Creat Ratio: 0.6 mg/g (ref 0.0–30.0)
Microalb, Ur: <0.7 mg/dL (ref 0.0–1.9)

## 2020-04-22 LAB — HEMOGLOBIN A1C: Hgb A1c MFr Bld: 6.6 % — ABNORMAL HIGH (ref 4.6–6.5)

## 2020-04-22 LAB — LIPID PANEL
Cholesterol: 210 mg/dL — ABNORMAL HIGH (ref 0–200)
HDL: 44 mg/dL (ref >39.00)
LDL Cholesterol: 145 mg/dL — ABNORMAL HIGH (ref 0–99)
NonHDL: 165.78
Total CHOL/HDL Ratio: 5
Triglycerides: 106 mg/dL (ref 0.0–149.0)
VLDL: 21.2 mg/dL (ref 0.0–40.0)

## 2020-04-22 LAB — VITAMIN D 25 HYDROXY (VIT D DEFICIENCY, FRACTURES): VITD: 18.84 ng/mL — ABNORMAL LOW (ref 30.00–100.00)

## 2020-04-22 LAB — TSH: TSH: 2.4 u[IU]/mL (ref 0.35–4.50)

## 2020-04-22 LAB — VITAMIN B12: Vitamin B-12: 494 pg/mL (ref 211–911)

## 2020-04-22 MED ORDER — TRAMADOL HCL 50 MG PO TABS
100.0000 mg | ORAL_TABLET | Freq: Every evening | ORAL | 5 refills | Status: DC | PRN
Start: 2020-04-22 — End: 2020-09-30

## 2020-04-22 NOTE — Assessment & Plan Note (Signed)
Moderate, worse at night, for tramadol qhs prn,  to f/u any worsening symptoms or concerns

## 2020-04-22 NOTE — Progress Notes (Signed)
Established Patient Office Visit  Subjective:  [name], male    DOB: 03/17/1941  Age: 80 y.o. MRN: [id number]       Chief Complaint:: wellness exam and Follow-up right knee pain and right hip pain and paresthesias s/p c spine surgury oct 2021 and hyperglycemia.       HPI:  Jonathan Lewis is a 80 y.o. male here for wellness exam  Wt Readings from Last 3 Encounters:  04/22/20 184 lb (83.5 kg)  02/18/20 181 lb 8 oz (82.3 kg)  01/09/20 185 lb 9.6 oz (84.2 kg)   BP Readings from Last 3 Encounters:  04/22/20 118/74  04/21/20 111/65  02/18/20 126/76        Also c/o acute problem of:  *s/p recent c spine surgury, still has some tingling to bilateral fingertips, as well as numb to distal RLE, But also has 1-2 mo gradually worsening right knee pain aching mod to severe worse at night, cant get to sleep well due to the pain  Also has an aching to the right hip area, but not worse to lie on it at night, thinks may have DJD  Pt denies chest pain, increased sob or doe, wheezing, orthopnea, PND, increased LE swelling, palpitations, dizziness or syncope.   Pt denies polydipsia, polyuria, Past Medical History:  Diagnosis Date  . Acoustic neuroma ([place]) 06/25/2015  . Allergic rhinitis 07/18/2014  . Arthritis   . BPH (benign prostatic hyperplasia)   . Cancer ([place])    stage III stomach-Dx 03/2019  . Carbuncle    recurrent MRSA carbuncles  . Cataract   . Diabetes mellitus    Type II  . Disk prolapse   . Glaucoma   . Hyperlipidemia   . Hypertension   . Male hypogonadism 07/16/2014  . Neuropathy   . Pneumonia   . Sinus bradycardia    chronic, asymptomatic  . Sinusitis 07/29/2012   Past Surgical History:  Procedure Laterality Date  . ANTERIOR CERVICAL DECOMPRESSION/DISCECTOMY FUSION 4 LEVELS N/A 01/08/2020   Procedure: ANTERIOR CERVICAL DECOMPRESSION FUSION CERVICAL 3-4, CERVICAL 4-5, CERVIAL 5-6 WITH INSTRUMENTATION AND ALLOGRAFT;  Surgeon: Phylliss Bob, MD;  Location:  [place];  Service: Orthopedics;  Laterality: N/A;  . BALLOON DILATION N/A 03/12/2019   Procedure: BALLOON DILATION;  Surgeon: Rush Landmark Telford Nab., MD;  Location: [facility];  Service: Gastroenterology;  Laterality: N/A;  pyloric  . BIOPSY  03/08/2019   Procedure: BIOPSY;  Surgeon: Yetta Flock, MD;  Location: [facility];  Service: Gastroenterology;;  . BIOPSY  03/12/2019   Procedure: BIOPSY;  Surgeon: Irving Copas., MD;  Location: [facility];  Service: Gastroenterology;;  . CATARACT EXTRACTION     x 2  . COLONOSCOPY    . ESOPHAGOGASTRODUODENOSCOPY N/A 03/08/2019   Procedure: ESOPHAGOGASTRODUODENOSCOPY (EGD);  Surgeon: Yetta Flock, MD;  Location: [facility];  Service: Gastroenterology;  Laterality: N/A;  . ESOPHAGOGASTRODUODENOSCOPY (EGD) WITH PROPOFOL N/A 03/12/2019   Procedure: ESOPHAGOGASTRODUODENOSCOPY (EGD) WITH PROPOFOL;  Surgeon: Rush Landmark Telford Nab., MD;  Location: [facility];  Service: Gastroenterology;  Laterality: N/A;  . FINE NEEDLE ASPIRATION  03/12/2019   Procedure: FINE NEEDLE ASPIRATION (FNA) LINEAR;  Surgeon: Irving Copas., MD;  Location: [facility];  Service: Gastroenterology;;  . FOREIGN BODY REMOVAL  03/08/2019   Procedure: FOREIGN BODY REMOVAL;  Surgeon: Yetta Flock, MD;  Location: [facility];  Service: Gastroenterology;;  . IR PATIENT EVAL TECH 0-60 MINS  04/03/2019  . LAPAROTOMY N/A 03/19/2019   Procedure: EXPLORATORY  LAPAROTOMY, distal GASTRECTOMY AND PLACEMENT OF G AND J TUBE, gastric jejunostomy;  Surgeon: Greer Pickerel, MD;  Location: [facility];  Service: General;  Laterality: N/A;  . POLYPECTOMY    . PORTACATH PLACEMENT N/A 03/26/2019   Procedure: INSERTION PORT-A-CATH;  Surgeon: Michael Boston, MD;  Location: [facility];  Service: General;  Laterality: N/A;  . UPPER ESOPHAGEAL ENDOSCOPIC ULTRASOUND (EUS) N/A 03/12/2019   Procedure: UPPER ESOPHAGEAL ENDOSCOPIC ULTRASOUND (EUS);  Surgeon: Irving Copas., MD;   Location: [facility];  Service: Gastroenterology;  Laterality: N/A;  . VIDEO BRONCHOSCOPY WITH ENDOBRONCHIAL ULTRASOUND N/A 05/22/2019   Procedure: VIDEO BRONCHOSCOPY WITH ENDOBRONCHIAL ULTRASOUND;  Surgeon: Garner Nash, DO;  Location: [facility];  Service: Thoracic;  Laterality: N/A;    reports that he quit smoking about 41 years ago. His smoking use included cigarettes. He has a 24.00 pack-year smoking history. He has never used smokeless tobacco. He reports previous alcohol use of about 5.0 standard drinks of alcohol per week. He reports that he does not use drugs. family history includes Cancer in his brother and mother; Diabetes in his sister; Hyperlipidemia in his brother; Hypertension in his brother, mother, and sister; Stroke in his brother. No Known Allergies Current Outpatient Medications on File Prior to Visit  Medication Sig Dispense Refill  . docusate sodium (COLACE) 100 MG capsule TAKE 1 CAPSULE BY MOUTH EVERY DAY 30 capsule 3  . famotidine (PEPCID) 20 MG tablet Take 1 tablet (20 mg total) by mouth daily. (Patient taking differently: Take 20 mg by mouth at bedtime.) 30 tablet 2  . gabapentin (NEURONTIN) 100 MG capsule Take 1 capsule (100 mg total) by mouth every 12 (twelve) hours. (Patient taking differently: Take 100 mg by mouth 2 (two) times daily as needed (pain.).) 60 capsule 1  . latanoprost (XALATAN) 0.005 % ophthalmic solution Place 1 drop into both eyes at bedtime.     . lidocaine-prilocaine (EMLA) cream Apply 1 application topically as needed. Place on the port one hour before appointment. 30 g 3  . methocarbamol (ROBAXIN) 500 MG tablet Take 1 tablet (500 mg total) by mouth every 6 (six) hours as needed for muscle spasms. 30 tablet 0  . Multiple Vitamin (MULTIVITAMIN WITH MINERALS) TABS tablet Take 1 tablet by mouth daily.    Marland Kitchen oxyCODONE-acetaminophen (PERCOCET/ROXICET) 5-325 MG tablet Take 1-2 tablets by mouth every 4 (four) hours as needed for moderate pain or severe pain.  30 tablet 0  . SYSTANE ULTRA 0.4-0.3 % SOLN Apply 1 drop to eye 3 (three) times daily as needed (dry eyes).      No current facility-administered medications on file prior to visit.        ROS:  All others reviewed and negative.  Objective        PE:  BP 118/74   Pulse 75   Temp 98.3 F (36.8 C) (Oral)   Ht 5\' 8"  (1.727 m)   Wt 184 lb (83.5 kg)   SpO2 95%   BMI 27.98 kg/m                 Constitutional: Pt appears in NAD               HENT: Head: NCAT.                Right Ear: External ear normal.                 Left Ear: External ear normal.  Eyes: . Pupils are equal, round, and reactive to light. Conjunctivae and EOM are normal               Nose: without d/c or deformity               Neck: Neck supple. Gross normal ROM               Cardiovascular: Normal rate and regular rhythm.                 Pulmonary/Chest: Effort normal and breath sounds without rales or wheezing.                Abd:  Soft, NT, ND, + BS, no organomegaly               Neurological: Pt is alert. At baseline orientation, motor grossly intact               Skin: Skin is warm. No rashes, no other new lesions, LE edema - none               Psychiatric: Pt behavior is normal without agitation   Assessment/Plan:  Jonathan Lewis is a 80 y.o. Black or African American [2] male with  has a past medical history of Acoustic neuroma ([place]) (06/25/2015), Allergic rhinitis (07/18/2014), Arthritis, BPH (benign prostatic hyperplasia), Cancer ([place]), Carbuncle, Cataract, Diabetes mellitus, Disk prolapse, Glaucoma, Hyperlipidemia, Hypertension, Male hypogonadism (07/16/2014), Neuropathy, Pneumonia, Sinus bradycardia, and Sinusitis (07/29/2012).  Assessment Plan  See notes Labs/data reviewed for each problem:  Micro: none  Cardiac tracings I have personally interpreted today:  none  Pertinent Radiological findings (summarize): none   There are no preventive care reminders to display for this  patient.  There are no preventive care reminders to display for this patient.  Lab Results  Component Value Date   TSH 2.792 10/09/2019   Lab Results  Component Value Date   WBC 2.9 (L) 02/18/2020   HGB 11.4 (L) 02/18/2020   HCT 35.2 (L) 02/18/2020   MCV 88.2 02/18/2020   PLT 174 02/18/2020   Lab Results  Component Value Date   NA 140 02/18/2020   K 3.9 02/18/2020   CO2 28 02/18/2020   GLUCOSE 103 (H) 02/18/2020   BUN 15 02/18/2020   CREATININE 0.90 02/18/2020   BILITOT 0.4 02/18/2020   ALKPHOS 58 02/18/2020   AST 18 02/18/2020   ALT 11 02/18/2020   PROT 6.4 (L) 02/18/2020   ALBUMIN 3.7 02/18/2020   CALCIUM 9.3 02/18/2020   ANIONGAP 5 02/18/2020   GFR 75.62 03/05/2019   Lab Results  Component Value Date   CHOL 160 05/14/2018   Lab Results  Component Value Date   HDL 29.80 (L) 05/14/2018   Lab Results  Component Value Date   LDLCALC 102 (H) 05/14/2018   Lab Results  Component Value Date   TRIG 87 03/25/2019   Lab Results  Component Value Date   CHOLHDL 5 05/14/2018   Lab Results  Component Value Date   HGBA1C 5.9 (H) 01/06/2020      Assessment & Plan:   Problem List Items Addressed This Visit      High   Encounter for well adult exam with abnormal findings    Overall doing well, age appropriate education and counseling updated, referrals for preventative services and immunizations addressed, dietary and smoking counseling addressed, most recent labs reviewed.  I have personally reviewed and have noted:  1) the patient's medical and social  history 2) The pt's use of alcohol, tobacco, and illicit drugs 3) The patient's current medications and supplements 4) Functional ability including ADL's, fall risk, home safety risk, hearing and visual impairment 5) Diet and physical activities 6) Evidence for depression or mood disorder 7) The patient's height, weight, and BMI have been recorded in the chart  I have made referrals, and provided counseling  and education based on review of the above         Medium   Right knee pain    Moderate, worse at night, for tramadol qhs prn,  to f/u any worsening symptoms or concerns      Hypertension complicating diabetes ([place])    BP Readings from Last 3 Encounters:  04/22/20 118/74  04/21/20 111/65  02/18/20 126/76   Stable, pt to continue medical treatment - no med, for low salt diet     Current Outpatient Medications (Analgesics):  .  oxyCODONE-acetaminophen (PERCOCET/ROXICET) 5-325 MG tablet, Take 1-2 tablets by mouth every 4 (four) hours as needed for moderate pain or severe pain. .  traMADol (ULTRAM) 50 MG tablet, Take 2 tablets (100 mg total) by mouth at bedtime as needed for moderate pain or severe pain.   Current Outpatient Medications (Other):  .  docusate sodium (COLACE) 100 MG capsule, TAKE 1 CAPSULE BY MOUTH EVERY DAY .  famotidine (PEPCID) 20 MG tablet, Take 1 tablet (20 mg total) by mouth daily. (Patient taking differently: Take 20 mg by mouth at bedtime.) .  gabapentin (NEURONTIN) 100 MG capsule, Take 1 capsule (100 mg total) by mouth every 12 (twelve) hours. (Patient taking differently: Take 100 mg by mouth 2 (two) times daily as needed (pain.).) .  latanoprost (XALATAN) 0.005 % ophthalmic solution, Place 1 drop into both eyes at bedtime.  .  lidocaine-prilocaine (EMLA) cream, Apply 1 application topically as needed. Place on the port one hour before appointment. .  methocarbamol (ROBAXIN) 500 MG tablet, Take 1 tablet (500 mg total) by mouth every 6 (six) hours as needed for muscle spasms. .  Multiple Vitamin (MULTIVITAMIN WITH MINERALS) TABS tablet, Take 1 tablet by mouth daily. .  SYSTANE ULTRA 0.4-0.3 % SOLN, Apply 1 drop to eye 3 (three) times daily as needed (dry eyes).       Hyperlipidemia    Lab Results  Component Value Date   LDLCALC 102 (H) 05/14/2018   Stable, pt to continue current low chol diet, declines statin     Current Outpatient Medications  (Analgesics):  .  oxyCODONE-acetaminophen (PERCOCET/ROXICET) 5-325 MG tablet, Take 1-2 tablets by mouth every 4 (four) hours as needed for moderate pain or severe pain. .  traMADol (ULTRAM) 50 MG tablet, Take 2 tablets (100 mg total) by mouth at bedtime as needed for moderate pain or severe pain.   Current Outpatient Medications (Other):  .  docusate sodium (COLACE) 100 MG capsule, TAKE 1 CAPSULE BY MOUTH EVERY DAY .  famotidine (PEPCID) 20 MG tablet, Take 1 tablet (20 mg total) by mouth daily. (Patient taking differently: Take 20 mg by mouth at bedtime.) .  gabapentin (NEURONTIN) 100 MG capsule, Take 1 capsule (100 mg total) by mouth every 12 (twelve) hours. (Patient taking differently: Take 100 mg by mouth 2 (two) times daily as needed (pain.).) .  latanoprost (XALATAN) 0.005 % ophthalmic solution, Place 1 drop into both eyes at bedtime.  .  lidocaine-prilocaine (EMLA) cream, Apply 1 application topically as needed. Place on the port one hour before appointment. .  methocarbamol (ROBAXIN) 500  MG tablet, Take 1 tablet (500 mg total) by mouth every 6 (six) hours as needed for muscle spasms. .  Multiple Vitamin (MULTIVITAMIN WITH MINERALS) TABS tablet, Take 1 tablet by mouth daily. .  SYSTANE ULTRA 0.4-0.3 % SOLN, Apply 1 drop to eye 3 (three) times daily as needed (dry eyes).        Diabetes ([place]) - Primary    Lab Results  Component Value Date   HGBA1C 5.9 (H) 01/06/2020   Stable, pt to continue current medical treatment  - diet     Current Outpatient Medications (Analgesics):  .  oxyCODONE-acetaminophen (PERCOCET/ROXICET) 5-325 MG tablet, Take 1-2 tablets by mouth every 4 (four) hours as needed for moderate pain or severe pain. .  traMADol (ULTRAM) 50 MG tablet, Take 2 tablets (100 mg total) by mouth at bedtime as needed for moderate pain or severe pain.   Current Outpatient Medications (Other):  .  docusate sodium (COLACE) 100 MG capsule, TAKE 1 CAPSULE BY MOUTH EVERY DAY .   famotidine (PEPCID) 20 MG tablet, Take 1 tablet (20 mg total) by mouth daily. (Patient taking differently: Take 20 mg by mouth at bedtime.) .  gabapentin (NEURONTIN) 100 MG capsule, Take 1 capsule (100 mg total) by mouth every 12 (twelve) hours. (Patient taking differently: Take 100 mg by mouth 2 (two) times daily as needed (pain.).) .  latanoprost (XALATAN) 0.005 % ophthalmic solution, Place 1 drop into both eyes at bedtime.  .  lidocaine-prilocaine (EMLA) cream, Apply 1 application topically as needed. Place on the port one hour before appointment. .  methocarbamol (ROBAXIN) 500 MG tablet, Take 1 tablet (500 mg total) by mouth every 6 (six) hours as needed for muscle spasms. .  Multiple Vitamin (MULTIVITAMIN WITH MINERALS) TABS tablet, Take 1 tablet by mouth daily. .  SYSTANE ULTRA 0.4-0.3 % SOLN, Apply 1 drop to eye 3 (three) times daily as needed (dry eyes).       Relevant Orders   Microalbumin / creatinine urine ratio   Hemoglobin A1c   Lipid panel   Hepatic function panel   CBC with Differential/Platelet   TSH   Urinalysis, Routine w reflex microscopic   Basic metabolic panel    Other Visit Diagnoses    B12 deficiency       Relevant Orders   Vitamin B12   Vitamin D deficiency       Relevant Orders   VITAMIN D 25 Hydroxy (Vit-D Deficiency, Fractures)      Meds ordered this encounter  Medications  . traMADol (ULTRAM) 50 MG tablet    Sig: Take 2 tablets (100 mg total) by mouth at bedtime as needed for moderate pain or severe pain.    Dispense:  60 tablet    Refill:  5    Follow-up: Return in about 6 months (around 10/20/2020).   Cathlean Cower, MD 04/22/2020 12:15 PM [facility]

## 2020-04-22 NOTE — Assessment & Plan Note (Signed)
BP Readings from Last 3 Encounters:  04/22/20 118/74  04/21/20 111/65  02/18/20 126/76   Stable, pt to continue medical treatment - no med, for low salt diet     Current Outpatient Medications (Analgesics):  .  oxyCODONE-acetaminophen (PERCOCET/ROXICET) 5-325 MG tablet, Take 1-2 tablets by mouth every 4 (four) hours as needed for moderate pain or severe pain. .  traMADol (ULTRAM) 50 MG tablet, Take 2 tablets (100 mg total) by mouth at bedtime as needed for moderate pain or severe pain.   Current Outpatient Medications (Other):  .  docusate sodium (COLACE) 100 MG capsule, TAKE 1 CAPSULE BY MOUTH EVERY DAY .  famotidine (PEPCID) 20 MG tablet, Take 1 tablet (20 mg total) by mouth daily. (Patient taking differently: Take 20 mg by mouth at bedtime.) .  gabapentin (NEURONTIN) 100 MG capsule, Take 1 capsule (100 mg total) by mouth every 12 (twelve) hours. (Patient taking differently: Take 100 mg by mouth 2 (two) times daily as needed (pain.).) .  latanoprost (XALATAN) 0.005 % ophthalmic solution, Place 1 drop into both eyes at bedtime.  .  lidocaine-prilocaine (EMLA) cream, Apply 1 application topically as needed. Place on the port one hour before appointment. .  methocarbamol (ROBAXIN) 500 MG tablet, Take 1 tablet (500 mg total) by mouth every 6 (six) hours as needed for muscle spasms. .  Multiple Vitamin (MULTIVITAMIN WITH MINERALS) TABS tablet, Take 1 tablet by mouth daily. .  SYSTANE ULTRA 0.4-0.3 % SOLN, Apply 1 drop to eye 3 (three) times daily as needed (dry eyes).

## 2020-04-22 NOTE — Assessment & Plan Note (Signed)

## 2020-04-22 NOTE — Assessment & Plan Note (Signed)
Lab Results  Component Value Date   HGBA1C 5.9 (H) 01/06/2020   Stable, pt to continue current medical treatment  - diet     Current Outpatient Medications (Analgesics):  .  oxyCODONE-acetaminophen (PERCOCET/ROXICET) 5-325 MG tablet, Take 1-2 tablets by mouth every 4 (four) hours as needed for moderate pain or severe pain. .  traMADol (ULTRAM) 50 MG tablet, Take 2 tablets (100 mg total) by mouth at bedtime as needed for moderate pain or severe pain.   Current Outpatient Medications (Other):  .  docusate sodium (COLACE) 100 MG capsule, TAKE 1 CAPSULE BY MOUTH EVERY DAY .  famotidine (PEPCID) 20 MG tablet, Take 1 tablet (20 mg total) by mouth daily. (Patient taking differently: Take 20 mg by mouth at bedtime.) .  gabapentin (NEURONTIN) 100 MG capsule, Take 1 capsule (100 mg total) by mouth every 12 (twelve) hours. (Patient taking differently: Take 100 mg by mouth 2 (two) times daily as needed (pain.).) .  latanoprost (XALATAN) 0.005 % ophthalmic solution, Place 1 drop into both eyes at bedtime.  .  lidocaine-prilocaine (EMLA) cream, Apply 1 application topically as needed. Place on the port one hour before appointment. .  methocarbamol (ROBAXIN) 500 MG tablet, Take 1 tablet (500 mg total) by mouth every 6 (six) hours as needed for muscle spasms. .  Multiple Vitamin (MULTIVITAMIN WITH MINERALS) TABS tablet, Take 1 tablet by mouth daily. .  SYSTANE ULTRA 0.4-0.3 % SOLN, Apply 1 drop to eye 3 (three) times daily as needed (dry eyes).

## 2020-04-22 NOTE — Assessment & Plan Note (Signed)
Lab Results  Component Value Date   LDLCALC 102 (H) 05/14/2018   Stable, pt to continue current low chol diet, declines statin     Current Outpatient Medications (Analgesics):  .  oxyCODONE-acetaminophen (PERCOCET/ROXICET) 5-325 MG tablet, Take 1-2 tablets by mouth every 4 (four) hours as needed for moderate pain or severe pain. .  traMADol (ULTRAM) 50 MG tablet, Take 2 tablets (100 mg total) by mouth at bedtime as needed for moderate pain or severe pain.   Current Outpatient Medications (Other):  .  docusate sodium (COLACE) 100 MG capsule, TAKE 1 CAPSULE BY MOUTH EVERY DAY .  famotidine (PEPCID) 20 MG tablet, Take 1 tablet (20 mg total) by mouth daily. (Patient taking differently: Take 20 mg by mouth at bedtime.) .  gabapentin (NEURONTIN) 100 MG capsule, Take 1 capsule (100 mg total) by mouth every 12 (twelve) hours. (Patient taking differently: Take 100 mg by mouth 2 (two) times daily as needed (pain.).) .  latanoprost (XALATAN) 0.005 % ophthalmic solution, Place 1 drop into both eyes at bedtime.  .  lidocaine-prilocaine (EMLA) cream, Apply 1 application topically as needed. Place on the port one hour before appointment. .  methocarbamol (ROBAXIN) 500 MG tablet, Take 1 tablet (500 mg total) by mouth every 6 (six) hours as needed for muscle spasms. .  Multiple Vitamin (MULTIVITAMIN WITH MINERALS) TABS tablet, Take 1 tablet by mouth daily. .  SYSTANE ULTRA 0.4-0.3 % SOLN, Apply 1 drop to eye 3 (three) times daily as needed (dry eyes).

## 2020-04-22 NOTE — Patient Instructions (Signed)
Please take all new medication as prescribed - the tramadol at night as needed for pain  Please make sure to follow up with your orthopedic about the right knee, as well as the right hip pain  Please continue all other medications as before, and refills have been done if requested.  Please have the pharmacy call with any other refills you may need.  Please continue your efforts at being more active, low cholesterol diet, and weight control.  You are otherwise up to date with prevention measures today.  Please keep your appointments with your specialists as you may have planned  Please go to the LAB at the blood drawing area for the tests to be done  You will be contacted by phone if any changes need to be made immediately.  Otherwise, you will receive a letter about your results with an explanation, but please check with MyChart first.  Please remember to sign up for MyChart if you have not done so, as this will be important to you in the future with finding out test results, communicating by private email, and scheduling acute appointments online when needed.  Please make an Appointment to return in 6 months, or sooner if needed

## 2020-04-23 LAB — URINALYSIS, ROUTINE W REFLEX MICROSCOPIC
Bilirubin Urine: NEGATIVE
Hgb urine dipstick: NEGATIVE
Ketones, ur: NEGATIVE
Leukocytes,Ua: NEGATIVE
Nitrite: NEGATIVE
RBC / HPF: NONE SEEN (ref 0–?)
Specific Gravity, Urine: 1.025 (ref 1.000–1.030)
Total Protein, Urine: NEGATIVE
Urine Glucose: NEGATIVE
Urobilinogen, UA: 0.2 (ref 0.0–1.0)
pH: 5.5 (ref 5.0–8.0)

## 2020-04-26 ENCOUNTER — Other Ambulatory Visit: Payer: Self-pay | Admitting: Internal Medicine

## 2020-04-26 ENCOUNTER — Encounter: Payer: Self-pay | Admitting: Internal Medicine

## 2020-04-26 MED ORDER — ATORVASTATIN CALCIUM 20 MG PO TABS: 20.0000 mg | ORAL_TABLET | Freq: Every day | ORAL | 3 refills | Status: DC

## 2020-04-28 DIAGNOSIS — M5000 Cervical disc disorder with myelopathy, unspecified cervical region: Secondary | ICD-10-CM | POA: Diagnosis not present

## 2020-04-28 DIAGNOSIS — Z9889 Other specified postprocedural states: Secondary | ICD-10-CM | POA: Diagnosis not present

## 2020-04-28 DIAGNOSIS — M5013 Cervical disc disorder with radiculopathy, cervicothoracic region: Secondary | ICD-10-CM | POA: Diagnosis not present

## 2020-04-28 DIAGNOSIS — M25611 Stiffness of right shoulder, not elsewhere classified: Secondary | ICD-10-CM | POA: Diagnosis not present

## 2020-05-29 ENCOUNTER — Encounter: Payer: Self-pay | Admitting: Internal Medicine

## 2020-05-29 ENCOUNTER — Ambulatory Visit: Payer: Medicare PPO | Admitting: Internal Medicine

## 2020-05-29 ENCOUNTER — Other Ambulatory Visit: Payer: Self-pay

## 2020-05-29 VITALS — BP 126/80 | HR 61 | Temp 98.0°F | Ht 68.0 in | Wt 186.0 lb

## 2020-05-29 DIAGNOSIS — R42 Dizziness and giddiness: Secondary | ICD-10-CM | POA: Diagnosis not present

## 2020-05-29 DIAGNOSIS — E785 Hyperlipidemia, unspecified: Secondary | ICD-10-CM

## 2020-05-29 DIAGNOSIS — E1165 Type 2 diabetes mellitus with hyperglycemia: Secondary | ICD-10-CM

## 2020-05-29 DIAGNOSIS — H903 Sensorineural hearing loss, bilateral: Secondary | ICD-10-CM

## 2020-05-29 DIAGNOSIS — M79604 Pain in right leg: Secondary | ICD-10-CM | POA: Diagnosis not present

## 2020-05-29 DIAGNOSIS — M79605 Pain in left leg: Secondary | ICD-10-CM

## 2020-05-29 MED ORDER — MECLIZINE HCL 12.5 MG PO TABS: 12.5000 mg | ORAL_TABLET | Freq: Three times a day (TID) | ORAL | 2 refills | Status: DC | PRN

## 2020-05-29 NOTE — Progress Notes (Signed)
[name], male   DOB: 15-Jan-1941, 80 y.o.   MRN: [id number]        Chief Complaint: vertigo, reduced hearing, bilat leg pain       HPI:  Jonathan Lewis is a 80 y.o. male here with c/o 3 days new onset vertigo with room spinning with position change such as sitting up or turning over in bed.   No HA, fever, chills, sinus congestion or pain, and Pt denies chest pain, increased sob or doe, wheezing, orthopnea, PND, increased LE swelling, palpitations, or syncope.   Pt denies polydipsia, polyuria,   Denies other focal neuro s/s,   Pt denies fever, wt loss, night sweats, loss of appetite, or other constitutional symptoms  Also has reduced hearing to both ears in the past year, thinks may need hearing aids now,  Also has worsening pain to both legs below the knees with walking, better with rest; has known hx of LS spine dx, to f/u DR Lynann Bologna apr 2022 for recent c spine surgury oct 2021.  Last LS spine MRI done July 2019.  Quit smoking x 30 yrs.  .        Wt Readings from Last 3 Encounters:  05/29/20 186 lb (84.4 kg)  04/22/20 184 lb (83.5 kg)  02/18/20 181 lb 8 oz (82.3 kg)   BP Readings from Last 3 Encounters:  05/29/20 126/80  04/22/20 118/74  04/21/20 111/65         Past Medical History:  Diagnosis Date  . Acoustic neuroma ([place]) 06/25/2015  . Allergic rhinitis 07/18/2014  . Arthritis   . BPH (benign prostatic hyperplasia)   . Cancer ([place])    stage III stomach-Dx 03/2019  . Carbuncle    recurrent MRSA carbuncles  . Cataract   . Diabetes mellitus    Type II  . Disk prolapse   . Glaucoma   . Hyperlipidemia   . Hypertension   . Male hypogonadism 07/16/2014  . Neuropathy   . Pneumonia   . Sinus bradycardia    chronic, asymptomatic  . Sinusitis 07/29/2012   Past Surgical History:  Procedure Laterality Date  . ANTERIOR CERVICAL DECOMPRESSION/DISCECTOMY FUSION 4 LEVELS N/A 01/08/2020   Procedure: ANTERIOR CERVICAL DECOMPRESSION FUSION CERVICAL 3-4, CERVICAL 4-5,  CERVIAL 5-6 WITH INSTRUMENTATION AND ALLOGRAFT;  Surgeon: Phylliss Bob, MD;  Location: [facility];  Service: Orthopedics;  Laterality: N/A;  . BALLOON DILATION N/A 03/12/2019   Procedure: BALLOON DILATION;  Surgeon: Rush Landmark Telford Nab., MD;  Location: [facility];  Service: Gastroenterology;  Laterality: N/A;  pyloric  . BIOPSY  03/08/2019   Procedure: BIOPSY;  Surgeon: Yetta Flock, MD;  Location: [facility];  Service: Gastroenterology;;  . BIOPSY  03/12/2019   Procedure: BIOPSY;  Surgeon: Irving Copas., MD;  Location: [facility];  Service: Gastroenterology;;  . CATARACT EXTRACTION     x 2  . COLONOSCOPY    . ESOPHAGOGASTRODUODENOSCOPY N/A 03/08/2019   Procedure: ESOPHAGOGASTRODUODENOSCOPY (EGD);  Surgeon: Yetta Flock, MD;  Location: [facility];  Service: Gastroenterology;  Laterality: N/A;  . ESOPHAGOGASTRODUODENOSCOPY (EGD) WITH PROPOFOL N/A 03/12/2019   Procedure: ESOPHAGOGASTRODUODENOSCOPY (EGD) WITH PROPOFOL;  Surgeon: Rush Landmark Telford Nab., MD;  Location: [facility];  Service: Gastroenterology;  Laterality: N/A;  . FINE NEEDLE ASPIRATION  03/12/2019   Procedure: FINE NEEDLE ASPIRATION (FNA) LINEAR;  Surgeon: Irving Copas., MD;  Location: [facility];  Service: Gastroenterology;;  . FOREIGN BODY REMOVAL  03/08/2019   Procedure: FOREIGN BODY REMOVAL;  Surgeon: Havery Moros,  Carlota Raspberry, MD;  Location: [facility];  Service: Gastroenterology;;  . Everlean Alstrom PATIENT EVAL TECH 0-60 MINS  04/03/2019  . LAPAROTOMY N/A 03/19/2019   Procedure: EXPLORATORY LAPAROTOMY, distal GASTRECTOMY AND PLACEMENT OF G AND J TUBE, gastric jejunostomy;  Surgeon: Greer Pickerel, MD;  Location: [facility];  Service: General;  Laterality: N/A;  . POLYPECTOMY    . PORTACATH PLACEMENT N/A 03/26/2019   Procedure: INSERTION PORT-A-CATH;  Surgeon: Michael Boston, MD;  Location: [facility];  Service: General;  Laterality: N/A;  . UPPER ESOPHAGEAL ENDOSCOPIC ULTRASOUND (EUS) N/A 03/12/2019    Procedure: UPPER ESOPHAGEAL ENDOSCOPIC ULTRASOUND (EUS);  Surgeon: Irving Copas., MD;  Location: [facility];  Service: Gastroenterology;  Laterality: N/A;  . VIDEO BRONCHOSCOPY WITH ENDOBRONCHIAL ULTRASOUND N/A 05/22/2019   Procedure: VIDEO BRONCHOSCOPY WITH ENDOBRONCHIAL ULTRASOUND;  Surgeon: Garner Nash, DO;  Location: [facility];  Service: Thoracic;  Laterality: N/A;    reports that he quit smoking about 41 years ago. His smoking use included cigarettes. He has a 24.00 pack-year smoking history. He has never used smokeless tobacco. He reports previous alcohol use of about 5.0 standard drinks of alcohol per week. He reports that he does not use drugs. family history includes Cancer in his brother and mother; Diabetes in his sister; Hyperlipidemia in his brother; Hypertension in his brother, mother, and sister; Stroke in his brother. No Known Allergies Current Outpatient Medications on File Prior to Visit  Medication Sig Dispense Refill  . atorvastatin (LIPITOR) 20 MG tablet Take 1 tablet (20 mg total) by mouth daily. 90 tablet 3  . docusate sodium (COLACE) 100 MG capsule TAKE 1 CAPSULE BY MOUTH EVERY DAY 30 capsule 3  . famotidine (PEPCID) 20 MG tablet Take 1 tablet (20 mg total) by mouth daily. (Patient taking differently: Take 20 mg by mouth at bedtime.) 30 tablet 2  . gabapentin (NEURONTIN) 100 MG capsule Take 1 capsule (100 mg total) by mouth every 12 (twelve) hours. (Patient taking differently: Take 100 mg by mouth 2 (two) times daily as needed (pain.).) 60 capsule 1  . latanoprost (XALATAN) 0.005 % ophthalmic solution Place 1 drop into both eyes at bedtime.     . lidocaine-prilocaine (EMLA) cream Apply 1 application topically as needed. Place on the port one hour before appointment. 30 g 3  . Multiple Vitamin (MULTIVITAMIN WITH MINERALS) TABS tablet Take 1 tablet by mouth daily.    Marland Kitchen oxyCODONE-acetaminophen (PERCOCET/ROXICET) 5-325 MG tablet Take 1-2 tablets by mouth every 4 (four)  hours as needed for moderate pain or severe pain. 30 tablet 0  . SYSTANE ULTRA 0.4-0.3 % SOLN Apply 1 drop to eye 3 (three) times daily as needed (dry eyes).     . traMADol (ULTRAM) 50 MG tablet Take 2 tablets (100 mg total) by mouth at bedtime as needed for moderate pain or severe pain. 60 tablet 5  . methocarbamol (ROBAXIN) 500 MG tablet Take 1 tablet (500 mg total) by mouth every 6 (six) hours as needed for muscle spasms. (Patient not taking: Reported on 05/29/2020) 30 tablet 0   No current facility-administered medications on file prior to visit.        ROS:  All others reviewed and negative.  Objective        PE:  BP 126/80   Pulse 61   Temp 98 F (36.7 C) (Oral)   Ht 5\' 8"  (1.727 m)   Wt 186 lb (84.4 kg)   SpO2 98%   BMI 28.28 kg/m  Constitutional: Pt appears in NAD               HENT: Head: NCAT.                Right Ear: External ear normal.                 Left Ear: External ear normal.                Eyes: . Pupils are equal, round, and reactive to light. Conjunctivae and EOM are normal               Nose: without d/c or deformity               Neck: Neck supple. Gross normal ROM               Cardiovascular: Normal rate and regular rhythm.                 Pulmonary/Chest: Effort normal and breath sounds without rales or wheezing.                Abd:  Soft, NT, ND, + BS, no organomegaly               Neurological: Pt is alert. At baseline orientation, motor grossly intact               Skin: Skin is warm. No rashes, no other new lesions, LE edema - none, has reduced dorsalis pedis bilat               Psychiatric: Pt behavior is normal without agitation   Micro: none  Cardiac tracings I have personally interpreted today:  none  Pertinent Radiological findings (summarize): none   Lab Results  Component Value Date   WBC 3.5 (L) 04/22/2020   HGB 12.7 (L) 04/22/2020   HCT 38.4 (L) 04/22/2020   PLT 187.0 04/22/2020   GLUCOSE 107 (H) 04/22/2020   CHOL  210 (H) 04/22/2020   TRIG 106.0 04/22/2020   HDL 44.00 04/22/2020   LDLDIRECT 121.0 11/07/2017   LDLCALC 145 (H) 04/22/2020   ALT 17 04/22/2020   AST 22 04/22/2020   NA 139 04/22/2020   K 4.3 04/22/2020   CL 104 04/22/2020   CREATININE 0.86 04/22/2020   BUN 13 04/22/2020   CO2 31 04/22/2020   TSH 2.40 04/22/2020   PSA 1.76 05/14/2018   INR 1.0 01/06/2020   HGBA1C 6.6 (H) 04/22/2020   MICROALBUR <0.7 04/22/2020   Assessment/Plan:  Jonathan Lewis is a 80 y.o. Black or African American [2] male with  has a past medical history of Acoustic neuroma ([place]) (06/25/2015), Allergic rhinitis (07/18/2014), Arthritis, BPH (benign prostatic hyperplasia), Cancer ([place]), Carbuncle, Cataract, Diabetes mellitus, Disk prolapse, Glaucoma, Hyperlipidemia, Hypertension, Male hypogonadism (07/16/2014), Neuropathy, Pneumonia, Sinus bradycardia, and Sinusitis (07/29/2012).  Leg pain, bilateral Etiology unclear, cant r/o PAD - for arterial vascular study, cont other current tx  Vertigo C/w BPV - for antivert prn,  to f/u any worsening symptoms or concerns  Diabetes ([place]) Lab Results  Component Value Date   HGBA1C 6.6 (H) 04/22/2020   Stable, pt to continue current medical treatment  - diet   Hyperlipidemia Lab Results  Component Value Date   LDLCALC 145 (H) 04/22/2020   Stable, pt to continue current statin liptior 20   Current Outpatient Medications (Cardiovascular):  .  atorvastatin (LIPITOR) 20 MG tablet, Take 1 tablet (20 mg total) by mouth daily.  Current Outpatient Medications (Analgesics):  .  oxyCODONE-acetaminophen (PERCOCET/ROXICET) 5-325 MG tablet, Take 1-2 tablets by mouth every 4 (four) hours as needed for moderate pain or severe pain. .  traMADol (ULTRAM) 50 MG tablet, Take 2 tablets (100 mg total) by mouth at bedtime as needed for moderate pain or severe pain.   Current Outpatient Medications (Other):  .  docusate sodium (COLACE) 100 MG capsule, TAKE 1 CAPSULE BY MOUTH EVERY  DAY .  famotidine (PEPCID) 20 MG tablet, Take 1 tablet (20 mg total) by mouth daily. (Patient taking differently: Take 20 mg by mouth at bedtime.) .  gabapentin (NEURONTIN) 100 MG capsule, Take 1 capsule (100 mg total) by mouth every 12 (twelve) hours. (Patient taking differently: Take 100 mg by mouth 2 (two) times daily as needed (pain.).) .  latanoprost (XALATAN) 0.005 % ophthalmic solution, Place 1 drop into both eyes at bedtime.  .  lidocaine-prilocaine (EMLA) cream, Apply 1 application topically as needed. Place on the port one hour before appointment. .  meclizine (ANTIVERT) 12.5 MG tablet, Take 1 tablet (12.5 mg total) by mouth 3 (three) times daily as needed for dizziness. .  Multiple Vitamin (MULTIVITAMIN WITH MINERALS) TABS tablet, Take 1 tablet by mouth daily. .  SYSTANE ULTRA 0.4-0.3 % SOLN, Apply 1 drop to eye 3 (three) times daily as needed (dry eyes).  .  methocarbamol (ROBAXIN) 500 MG tablet, Take 1 tablet (500 mg total) by mouth every 6 (six) hours as needed for muscle spasms. (Patient not taking: Reported on 05/29/2020)  Sensorineural hearing loss (SNHL) of both ears For ENT referral,  to f/u any worsening symptoms or concerns  Followup: Return in about 3 months (around 08/26/2020).  Cathlean Cower, MD 05/31/2020 4:33 AM [facility]

## 2020-05-29 NOTE — Patient Instructions (Signed)
.  Please take all new medication as prescribed - the meclizine as needed for dizziness  Please call for that kind of physical therapy we mentioned if you are not getting better  You will be contacted regarding the referral for: leg circulation testing  Please continue all other medications as before, and refills have been done if requested.  Please have the pharmacy call with any other refills you may need.  Please continue your efforts at being more active, low cholesterol diet, and weight control.  Please keep your appointments with your specialists as you may have planned

## 2020-05-31 ENCOUNTER — Encounter: Payer: Self-pay | Admitting: Internal Medicine

## 2020-05-31 NOTE — Assessment & Plan Note (Signed)
C/w BPV - for antivert prn,  to f/u any worsening symptoms or concerns

## 2020-05-31 NOTE — Assessment & Plan Note (Signed)
Lab Results  Component Value Date   HGBA1C 6.6 (H) 04/22/2020   Stable, pt to continue current medical treatment  - diet

## 2020-05-31 NOTE — Assessment & Plan Note (Signed)
For ENT referral,  to f/u any worsening symptoms or concerns 

## 2020-05-31 NOTE — Assessment & Plan Note (Signed)
Etiology unclear, cant r/o PAD - for arterial vascular study, cont other current tx

## 2020-05-31 NOTE — Assessment & Plan Note (Signed)
Lab Results  Component Value Date   LDLCALC 145 (H) 04/22/2020   Stable, pt to continue current statin liptior 20   Current Outpatient Medications (Cardiovascular):  .  atorvastatin (LIPITOR) 20 MG tablet, Take 1 tablet (20 mg total) by mouth daily.   Current Outpatient Medications (Analgesics):  .  oxyCODONE-acetaminophen (PERCOCET/ROXICET) 5-325 MG tablet, Take 1-2 tablets by mouth every 4 (four) hours as needed for moderate pain or severe pain. .  traMADol (ULTRAM) 50 MG tablet, Take 2 tablets (100 mg total) by mouth at bedtime as needed for moderate pain or severe pain.   Current Outpatient Medications (Other):  .  docusate sodium (COLACE) 100 MG capsule, TAKE 1 CAPSULE BY MOUTH EVERY DAY .  famotidine (PEPCID) 20 MG tablet, Take 1 tablet (20 mg total) by mouth daily. (Patient taking differently: Take 20 mg by mouth at bedtime.) .  gabapentin (NEURONTIN) 100 MG capsule, Take 1 capsule (100 mg total) by mouth every 12 (twelve) hours. (Patient taking differently: Take 100 mg by mouth 2 (two) times daily as needed (pain.).) .  latanoprost (XALATAN) 0.005 % ophthalmic solution, Place 1 drop into both eyes at bedtime.  .  lidocaine-prilocaine (EMLA) cream, Apply 1 application topically as needed. Place on the port one hour before appointment. .  meclizine (ANTIVERT) 12.5 MG tablet, Take 1 tablet (12.5 mg total) by mouth 3 (three) times daily as needed for dizziness. .  Multiple Vitamin (MULTIVITAMIN WITH MINERALS) TABS tablet, Take 1 tablet by mouth daily. .  SYSTANE ULTRA 0.4-0.3 % SOLN, Apply 1 drop to eye 3 (three) times daily as needed (dry eyes).  .  methocarbamol (ROBAXIN) 500 MG tablet, Take 1 tablet (500 mg total) by mouth every 6 (six) hours as needed for muscle spasms. (Patient not taking: Reported on 05/29/2020)

## 2020-06-03 ENCOUNTER — Telehealth: Payer: Self-pay

## 2020-06-03 NOTE — Telephone Encounter (Signed)
Called pt as per tara/secure chat his ins will not cover scans to be done at [facility].  I have spoken with the pt and he is aware that we had to r/s his appts.  I have mailed a new sch to the pt   Mileah Hemmer

## 2020-06-10 ENCOUNTER — Ambulatory Visit: Payer: Medicare PPO | Admitting: Hematology & Oncology

## 2020-06-10 ENCOUNTER — Other Ambulatory Visit: Payer: Medicare PPO

## 2020-06-11 ENCOUNTER — Other Ambulatory Visit: Payer: Self-pay

## 2020-06-11 ENCOUNTER — Ambulatory Visit (HOSPITAL_COMMUNITY)
Admission: RE | Admit: 2020-06-11 | Discharge: 2020-06-11 | Disposition: A | Discharge status: Home / Self Care | Payer: Medicare PPO | Source: Ambulatory Visit | Attending: Cardiology | Admitting: Cardiology

## 2020-06-11 DIAGNOSIS — M79604 Pain in right leg: Secondary | ICD-10-CM | POA: Diagnosis not present

## 2020-06-11 DIAGNOSIS — M79605 Pain in left leg: Secondary | ICD-10-CM | POA: Diagnosis not present

## 2020-06-12 ENCOUNTER — Encounter (HOSPITAL_COMMUNITY): Payer: Self-pay

## 2020-06-12 ENCOUNTER — Encounter: Payer: Self-pay | Admitting: Hematology & Oncology

## 2020-06-12 ENCOUNTER — Inpatient Hospital Stay: Payer: Medicare PPO | Attending: Hematology & Oncology | Admitting: Hematology & Oncology

## 2020-06-12 ENCOUNTER — Encounter: Payer: Self-pay | Admitting: Internal Medicine

## 2020-06-12 ENCOUNTER — Ambulatory Visit (HOSPITAL_COMMUNITY)
Admission: RE | Admit: 2020-06-12 | Discharge: 2020-06-12 | Disposition: A | Discharge status: Home / Self Care | Payer: Medicare PPO | Source: Ambulatory Visit | Attending: Hematology & Oncology | Admitting: Hematology & Oncology

## 2020-06-12 ENCOUNTER — Inpatient Hospital Stay: Payer: Medicare PPO

## 2020-06-12 ENCOUNTER — Other Ambulatory Visit: Payer: Self-pay

## 2020-06-12 VITALS — BP 129/78 | HR 76 | Temp 98.7°F | Resp 18 | Wt 187.0 lb

## 2020-06-12 DIAGNOSIS — C162 Malignant neoplasm of body of stomach: Secondary | ICD-10-CM | POA: Diagnosis not present

## 2020-06-12 DIAGNOSIS — I2510 Atherosclerotic heart disease of native coronary artery without angina pectoris: Secondary | ICD-10-CM | POA: Diagnosis not present

## 2020-06-12 DIAGNOSIS — C161 Malignant neoplasm of fundus of stomach: Secondary | ICD-10-CM

## 2020-06-12 DIAGNOSIS — J8410 Pulmonary fibrosis, unspecified: Secondary | ICD-10-CM | POA: Diagnosis not present

## 2020-06-12 DIAGNOSIS — K838 Other specified diseases of biliary tract: Secondary | ICD-10-CM | POA: Diagnosis not present

## 2020-06-12 DIAGNOSIS — C169 Malignant neoplasm of stomach, unspecified: Secondary | ICD-10-CM | POA: Insufficient documentation

## 2020-06-12 DIAGNOSIS — Z452 Encounter for adjustment and management of vascular access device: Secondary | ICD-10-CM | POA: Diagnosis not present

## 2020-06-12 DIAGNOSIS — K7689 Other specified diseases of liver: Secondary | ICD-10-CM | POA: Diagnosis not present

## 2020-06-12 DIAGNOSIS — K8689 Other specified diseases of pancreas: Secondary | ICD-10-CM | POA: Diagnosis not present

## 2020-06-12 DIAGNOSIS — C269 Malignant neoplasm of ill-defined sites within the digestive system: Secondary | ICD-10-CM | POA: Diagnosis not present

## 2020-06-12 DIAGNOSIS — Z95828 Presence of other vascular implants and grafts: Secondary | ICD-10-CM

## 2020-06-12 LAB — CMP (CANCER CENTER ONLY)
ALT: 64 U/L — ABNORMAL HIGH (ref 0–44)
AST: 56 U/L — ABNORMAL HIGH (ref 15–41)
Albumin: 3.9 g/dL (ref 3.5–5.0)
Alkaline Phosphatase: 144 U/L — ABNORMAL HIGH (ref 38–126)
Anion gap: 6 (ref 5–15)
BUN: 11 mg/dL (ref 8–23)
CO2: 26 mmol/L (ref 22–32)
Calcium: 9.2 mg/dL (ref 8.9–10.3)
Chloride: 108 mmol/L (ref 98–111)
Creatinine: 0.8 mg/dL (ref 0.61–1.24)
GFR, Estimated: >60 mL/min (ref >60)
Glucose, Bld: 118 mg/dL — ABNORMAL HIGH (ref 70–99)
Potassium: 4.1 mmol/L (ref 3.5–5.1)
Sodium: 140 mmol/L (ref 135–145)
Total Bilirubin: 0.6 mg/dL (ref 0.3–1.2)
Total Protein: 7.3 g/dL (ref 6.5–8.1)

## 2020-06-12 LAB — CBC WITH DIFFERENTIAL (CANCER CENTER ONLY)
Abs Immature Granulocytes: 0 10*3/uL (ref 0.00–0.07)
Basophils Absolute: 0 10*3/uL (ref 0.0–0.1)
Basophils Relative: 1 %
Eosinophils Absolute: 0.1 10*3/uL (ref 0.0–0.5)
Eosinophils Relative: 1 %
HCT: 35.7 % — ABNORMAL LOW (ref 39.0–52.0)
Hemoglobin: 11.8 g/dL — ABNORMAL LOW (ref 13.0–17.0)
Immature Granulocytes: 0 %
Lymphocytes Relative: 47 %
Lymphs Abs: 1.8 10*3/uL (ref 0.7–4.0)
MCH: 28.4 pg (ref 26.0–34.0)
MCHC: 33.1 g/dL (ref 30.0–36.0)
MCV: 85.8 fL (ref 80.0–100.0)
Monocytes Absolute: 0.4 10*3/uL (ref 0.1–1.0)
Monocytes Relative: 11 %
Neutro Abs: 1.5 10*3/uL — ABNORMAL LOW (ref 1.7–7.7)
Neutrophils Relative %: 40 %
Platelet Count: 198 10*3/uL (ref 150–400)
RBC: 4.16 MIL/uL — ABNORMAL LOW (ref 4.22–5.81)
RDW: 13.2 % (ref 11.5–15.5)
WBC Count: 3.7 10*3/uL — ABNORMAL LOW (ref 4.0–10.5)
nRBC: 0 % (ref 0.0–0.2)

## 2020-06-12 LAB — LACTATE DEHYDROGENASE: LDH: 176 U/L (ref 98–192)

## 2020-06-12 MED ORDER — SODIUM CHLORIDE 0.9% FLUSH
10.0000 mL | INTRAVENOUS | Status: DC | PRN
  Administered 2020-06-12: 10 mL via INTRAVENOUS
  Filled 2020-06-12: qty 10

## 2020-06-12 MED ORDER — HEPARIN SOD (PORK) LOCK FLUSH 100 UNIT/ML IV SOLN
500.0000 [IU] | Freq: Once | INTRAVENOUS | Status: AC
  Administered 2020-06-12: 500 [IU] via INTRAVENOUS

## 2020-06-12 MED ORDER — IOHEXOL 300 MG/ML  SOLN
100.0000 mL | Freq: Once | INTRAMUSCULAR | Status: AC | PRN
  Administered 2020-06-12: 100 mL via INTRAVENOUS

## 2020-06-12 MED ORDER — HEPARIN SOD (PORK) LOCK FLUSH 100 UNIT/ML IV SOLN
INTRAVENOUS | Status: AC
  Filled 2020-06-12: qty 5

## 2020-06-12 NOTE — Patient Instructions (Signed)
Implanted Port Insertion, Care After This sheet gives you information about how to care for yourself after your procedure. Your health care provider may also give you more specific instructions. If you have problems or questions, contact your health care provider. What can I expect after the procedure? After the procedure, it is common to have:  Discomfort at the port insertion site.  Bruising on the skin over the port. This should improve over 3-4 days. Follow these instructions at home: Port care  After your port is placed, you will get a manufacturer's information card. The card has information about your port. Keep this card with you at all times.  Take care of the port as told by your health care provider. Ask your health care provider if you or a family member can get training for taking care of the port at home. A home health care nurse may also take care of the port.  Make sure to remember what type of port you have. Incision care  Follow instructions from your health care provider about how to take care of your port insertion site. Make sure you: ? Wash your hands with soap and water before and after you change your bandage (dressing). If soap and water are not available, use hand sanitizer. ? Change your dressing as told by your health care provider. ? Leave stitches (sutures), skin glue, or adhesive strips in place. These skin closures may need to stay in place for 2 weeks or longer. If adhesive strip edges start to loosen and curl up, you may trim the loose edges. Do not remove adhesive strips completely unless your health care provider tells you to do that.  Check your port insertion site every day for signs of infection. Check for: ? Redness, swelling, or pain. ? Fluid or blood. ? Warmth. ? Pus or a bad smell.      Activity  Return to your normal activities as told by your health care provider. Ask your health care provider what activities are safe for you.  Do not  lift anything that is heavier than 10 lb (4.5 kg), or the limit that you are told, until your health care provider says that it is safe. General instructions  Take over-the-counter and prescription medicines only as told by your health care provider.  Do not take baths, swim, or use a hot tub until your health care provider approves. Ask your health care provider if you may take showers. You may only be allowed to take sponge baths.  Do not drive for 24 hours if you were given a sedative during your procedure.  Wear a medical alert bracelet in case of an emergency. This will tell any health care providers that you have a port.  Keep all follow-up visits as told by your health care provider. This is important. Contact a health care provider if:  You cannot flush your port with saline as directed, or you cannot draw blood from the port.  You have a fever or chills.  You have redness, swelling, or pain around your port insertion site.  You have fluid or blood coming from your port insertion site.  Your port insertion site feels warm to the touch.  You have pus or a bad smell coming from the port insertion site. Get help right away if:  You have chest pain or shortness of breath.  You have bleeding from your port that you cannot control. Summary  Take care of the port as told by your   health care provider. Keep the manufacturer's information card with you at all times.  Change your dressing as told by your health care provider.  Contact a health care provider if you have a fever or chills or if you have redness, swelling, or pain around your port insertion site.  Keep all follow-up visits as told by your health care provider. This information is not intended to replace advice given to you by your health care provider. Make sure you discuss any questions you have with your health care provider. Document Revised: 10/17/2017 Document Reviewed: 10/17/2017 Elsevier Patient Education   2021 [employer]  

## 2020-06-12 NOTE — Progress Notes (Signed)
Hematology and Oncology Follow Up Visit  Jonathan Lewis [id number] [birth year] 80 y.o. 06/12/2020   Principle Diagnosis:  Stage IIIA (T3N2M0) adenocarcinoma of the stomach -- S/p distal gastrectomy on 03/19/2019 -- HER2-/PD-L1+  Current Therapy: Adjuvant FOLFOX -started on 05/13/2019, s/p cycle#8 -completed on Dec 11, 2019   Interim History:  Jonathan Lewis is here today for follow-up.  Jonathan Lewis had a CT scan done today.  Unfortunately, Jonathan Lewis might have a problem now.  The CT scan seems to show that there were some new areas that were not noted previously.  Jonathan Lewis seems to have some peritoneal nodules.  Again everything is less than a centimeter.  Jonathan Lewis has a hypodensity in the left hepatic lobe and right hepatic lobe.  There are some biliary distention.  There is no fluid in the belly.  Jonathan Lewis has stable left paratracheal nodes and prevascular lymph nodes.  Jonathan Lewis has no suspicious pulmonary nodules.  I am just very disappointed over this.  His last chemotherapy was back in September.  Jonathan Lewis had done well.  His last CT scan looked fine and November.  Jonathan Lewis really has no abdominal issues.  Jonathan Lewis is eating okay.  Jonathan Lewis has had no nausea or vomiting.  There is been no change in bowel or bladder habits.  Jonathan Lewis has had no issues with fever.  There is no bleeding.  Jonathan Lewis has had no cough.  Jonathan Lewis has had no shortness of breath.  Overall, his performance status is ECOG 1.  .   Medications:  Allergies as of 06/12/2020   No Known Allergies     Medication List       Accurate as of June 12, 2020  5:22 PM. If you have any questions, ask your nurse or doctor.        atorvastatin 20 MG tablet Commonly known as: Lipitor Take 1 tablet (20 mg total) by mouth daily.   docusate sodium 100 MG capsule Commonly known as: COLACE TAKE 1 CAPSULE BY MOUTH EVERY DAY   famotidine 20 MG tablet Commonly known as: PEPCID Take 1 tablet (20 mg total) by mouth daily. What changed: when to take this   gabapentin 100 MG capsule Commonly  known as: NEURONTIN Take 1 capsule (100 mg total) by mouth every 12 (twelve) hours. What changed:   when to take this  reasons to take this   latanoprost 0.005 % ophthalmic solution Commonly known as: XALATAN Place 1 drop into both eyes at bedtime.   lidocaine-prilocaine cream Commonly known as: EMLA Apply 1 application topically as needed. Place on the port one hour before appointment.   meclizine 12.5 MG tablet Commonly known as: ANTIVERT Take 1 tablet (12.5 mg total) by mouth 3 (three) times daily as needed for dizziness.   methocarbamol 500 MG tablet Commonly known as: ROBAXIN Take 1 tablet (500 mg total) by mouth every 6 (six) hours as needed for muscle spasms.   multivitamin with minerals Tabs tablet Take 1 tablet by mouth daily.   oxyCODONE-acetaminophen 5-325 MG tablet Commonly known as: PERCOCET/ROXICET Take 1-2 tablets by mouth every 4 (four) hours as needed for moderate pain or severe pain.   Systane Ultra 0.4-0.3 % Soln Generic drug: Polyethyl Glycol-Propyl Glycol Apply 1 drop to eye 3 (three) times daily as needed (dry eyes).   traMADol 50 MG tablet Commonly known as: ULTRAM Take 2 tablets (100 mg total) by mouth at bedtime as needed for moderate pain or severe pain.       Allergies: No Known Allergies  Past Medical History, Surgical history, Social history, and Family History were reviewed and updated.  Review of Systems: Review of Systems  Constitutional: Negative.   HENT: Negative.   Eyes: Negative.   Respiratory: Negative.   Cardiovascular: Negative.   Gastrointestinal: Positive for abdominal pain.  Genitourinary: Negative.   Musculoskeletal: Negative.   Skin: Negative.   Neurological: Positive for tingling.  Endo/Heme/Allergies: Negative.   Psychiatric/Behavioral: Negative.      Physical Exam:  weight is 187 lb (84.8 kg). His oral temperature is 98.7 F (37.1 C). His blood pressure is 129/78 and his pulse is 76. His respiration is 18  and oxygen saturation is 100%.   Wt Readings from Last 3 Encounters:  06/12/20 187 lb (84.8 kg)  05/29/20 186 lb (84.4 kg)  04/22/20 184 lb (83.5 kg)    Physical Activity: Not on file    Physical Exam Vitals reviewed.  HENT:     Head: Normocephalic and atraumatic.  Eyes:     Pupils: Pupils are equal, round, and reactive to light.  Cardiovascular:     Rate and Rhythm: Normal rate and regular rhythm.     Heart sounds: Normal heart sounds.  Pulmonary:     Effort: Pulmonary effort is normal.     Breath sounds: Normal breath sounds.  Abdominal:     General: Bowel sounds are normal.     Palpations: Abdomen is soft.     Comments: Abdominal exam is soft.  Jonathan Lewis has well-healed laparotomy scars.  There is no tenderness to palpation.  Jonathan Lewis has no guarding or rebound tenderness.  There is no fluid wave.  There is no palpable liver or spleen tip.  Musculoskeletal:        General: No tenderness or deformity. Normal range of motion.     Cervical back: Normal range of motion.  Lymphadenopathy:     Cervical: No cervical adenopathy.  Skin:    General: Skin is warm and dry.     Findings: No erythema or rash.  Neurological:     Mental Status: Jonathan Lewis is alert and oriented to person, place, and time.  Psychiatric:        Behavior: Behavior normal.        Thought Content: Thought content normal.        Judgment: Judgment normal.     Lab Results  Component Value Date   WBC 3.7 (L) 06/12/2020   HGB 11.8 (L) 06/12/2020   HCT 35.7 (L) 06/12/2020   MCV 85.8 06/12/2020   PLT 198 06/12/2020   Lab Results  Component Value Date   FERRITIN 37 09/25/2019   IRON 98 09/25/2019   TIBC 328 09/25/2019   UIBC 230 09/25/2019   IRONPCTSAT 30 09/25/2019   Lab Results  Component Value Date   RBC 4.16 (L) 06/12/2020   No results found for: KPAFRELGTCHN, LAMBDASER, KAPLAMBRATIO No results found for: IGGSERUM, IGA, IGMSERUM No results found for: Ronnald Ramp, A1GS, A2GS, Violet Baldy,  MSPIKE, SPEI   Chemistry      Component Value Date/Time   NA 140 06/12/2020 1207   K 4.1 06/12/2020 1207   CL 108 06/12/2020 1207   CO2 26 06/12/2020 1207   BUN 11 06/12/2020 1207   CREATININE 0.80 06/12/2020 1207   CREATININE 1.06 07/16/2012 1038      Component Value Date/Time   CALCIUM 9.2 06/12/2020 1207   ALKPHOS 144 (H) 06/12/2020 1207   AST 56 (H) 06/12/2020 1207   ALT 64 (H) 06/12/2020 1207  BILITOT 0.6 06/12/2020 1207       Impression and Plan: Mr. Reesman is a very pleasant 80 yo African American gentleman with stage IIIA (T3N2M0) adenocarcinoma of the stomach, HER2-/PD-L1+. Jonathan Lewis had a distal gastrectomy on 12/15/2020andis currently receiving FOLFOX.   Again, we might be looking at recurrent disease.  I know Jonathan Lewis had locally advanced disease initially.  Jonathan Lewis was treated fairly aggressively.  We will have to do a PET scan on him.  I think this would be reasonable to do.  We will have to see what this shows.  We may have to consider a biopsy.  Again, it would be hard to biopsy anything right now since nothing really is large enough to safely biopsy in my opinion.  We have to keep him on his overall quality of life.  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  I will plan to get him back in about 3 or 4 weeks depending on what our PET scan shows.      Volanda Napoleon, MD 3/11/20225:22 PM

## 2020-06-15 ENCOUNTER — Telehealth: Payer: Self-pay

## 2020-06-15 NOTE — Telephone Encounter (Signed)
Called and left a vm with his f/u appt per 06/12/20 los ant that he will also get a call with his PET appt one approved     anne

## 2020-06-18 DIAGNOSIS — H401231 Low-tension glaucoma, bilateral, mild stage: Secondary | ICD-10-CM | POA: Diagnosis not present

## 2020-06-18 DIAGNOSIS — H04123 Dry eye syndrome of bilateral lacrimal glands: Secondary | ICD-10-CM | POA: Diagnosis not present

## 2020-06-25 ENCOUNTER — Other Ambulatory Visit: Payer: Self-pay

## 2020-06-25 ENCOUNTER — Ambulatory Visit (HOSPITAL_COMMUNITY)
Admission: RE | Admit: 2020-06-25 | Discharge: 2020-06-25 | Disposition: A | Discharge status: Home / Self Care | Payer: Medicare PPO | Source: Ambulatory Visit | Attending: Hematology & Oncology | Admitting: Hematology & Oncology

## 2020-06-25 DIAGNOSIS — C786 Secondary malignant neoplasm of retroperitoneum and peritoneum: Secondary | ICD-10-CM | POA: Diagnosis not present

## 2020-06-25 DIAGNOSIS — C787 Secondary malignant neoplasm of liver and intrahepatic bile duct: Secondary | ICD-10-CM | POA: Diagnosis not present

## 2020-06-25 DIAGNOSIS — R918 Other nonspecific abnormal finding of lung field: Secondary | ICD-10-CM | POA: Diagnosis not present

## 2020-06-25 DIAGNOSIS — C162 Malignant neoplasm of body of stomach: Secondary | ICD-10-CM

## 2020-06-25 LAB — GLUCOSE, CAPILLARY: Glucose-Capillary: 141 mg/dL — ABNORMAL HIGH (ref 70–99)

## 2020-06-25 MED ORDER — FLUDEOXYGLUCOSE F - 18 (FDG) INJECTION
9.5000 | Freq: Once | INTRAVENOUS | Status: AC
  Administered 2020-06-25: 9.35 via INTRAVENOUS

## 2020-06-26 DIAGNOSIS — M542 Cervicalgia: Secondary | ICD-10-CM | POA: Diagnosis not present

## 2020-07-09 ENCOUNTER — Inpatient Hospital Stay: Payer: Medicare PPO

## 2020-07-09 ENCOUNTER — Inpatient Hospital Stay (HOSPITAL_BASED_OUTPATIENT_CLINIC_OR_DEPARTMENT_OTHER): Payer: Medicare PPO | Admitting: Hematology & Oncology

## 2020-07-09 ENCOUNTER — Other Ambulatory Visit: Payer: Self-pay | Admitting: *Deleted

## 2020-07-09 ENCOUNTER — Other Ambulatory Visit: Payer: Self-pay

## 2020-07-09 ENCOUNTER — Encounter: Payer: Self-pay | Admitting: Hematology & Oncology

## 2020-07-09 ENCOUNTER — Inpatient Hospital Stay: Payer: Medicare PPO | Attending: Hematology & Oncology

## 2020-07-09 VITALS — BP 128/76 | HR 69 | Temp 98.7°F | Resp 18 | Wt 188.0 lb

## 2020-07-09 DIAGNOSIS — C787 Secondary malignant neoplasm of liver and intrahepatic bile duct: Secondary | ICD-10-CM | POA: Diagnosis not present

## 2020-07-09 DIAGNOSIS — Z79899 Other long term (current) drug therapy: Secondary | ICD-10-CM | POA: Diagnosis not present

## 2020-07-09 DIAGNOSIS — C169 Malignant neoplasm of stomach, unspecified: Secondary | ICD-10-CM | POA: Diagnosis not present

## 2020-07-09 DIAGNOSIS — C163 Malignant neoplasm of pyloric antrum: Secondary | ICD-10-CM | POA: Diagnosis not present

## 2020-07-09 DIAGNOSIS — Z5111 Encounter for antineoplastic chemotherapy: Secondary | ICD-10-CM | POA: Diagnosis not present

## 2020-07-09 DIAGNOSIS — C7889 Secondary malignant neoplasm of other digestive organs: Secondary | ICD-10-CM | POA: Insufficient documentation

## 2020-07-09 DIAGNOSIS — R109 Unspecified abdominal pain: Secondary | ICD-10-CM | POA: Insufficient documentation

## 2020-07-09 DIAGNOSIS — Z5112 Encounter for antineoplastic immunotherapy: Secondary | ICD-10-CM | POA: Diagnosis not present

## 2020-07-09 DIAGNOSIS — Z95828 Presence of other vascular implants and grafts: Secondary | ICD-10-CM

## 2020-07-09 DIAGNOSIS — C162 Malignant neoplasm of body of stomach: Secondary | ICD-10-CM

## 2020-07-09 DIAGNOSIS — C786 Secondary malignant neoplasm of retroperitoneum and peritoneum: Secondary | ICD-10-CM | POA: Insufficient documentation

## 2020-07-09 LAB — CBC WITH DIFFERENTIAL (CANCER CENTER ONLY)
Abs Immature Granulocytes: 0.01 10*3/uL (ref 0.00–0.07)
Basophils Absolute: 0 10*3/uL (ref 0.0–0.1)
Basophils Relative: 0 %
Eosinophils Absolute: 0.1 10*3/uL (ref 0.0–0.5)
Eosinophils Relative: 2 %
HCT: 34.8 % — ABNORMAL LOW (ref 39.0–52.0)
Hemoglobin: 11.4 g/dL — ABNORMAL LOW (ref 13.0–17.0)
Immature Granulocytes: 0 %
Lymphocytes Relative: 34 %
Lymphs Abs: 1.4 10*3/uL (ref 0.7–4.0)
MCH: 28.6 pg (ref 26.0–34.0)
MCHC: 32.8 g/dL (ref 30.0–36.0)
MCV: 87.4 fL (ref 80.0–100.0)
Monocytes Absolute: 0.4 10*3/uL (ref 0.1–1.0)
Monocytes Relative: 8 %
Neutro Abs: 2.4 10*3/uL (ref 1.7–7.7)
Neutrophils Relative %: 56 %
Platelet Count: 180 10*3/uL (ref 150–400)
RBC: 3.98 MIL/uL — ABNORMAL LOW (ref 4.22–5.81)
RDW: 12.9 % (ref 11.5–15.5)
WBC Count: 4.3 10*3/uL (ref 4.0–10.5)
nRBC: 0 % (ref 0.0–0.2)

## 2020-07-09 LAB — CMP (CANCER CENTER ONLY)
ALT: 53 U/L — ABNORMAL HIGH (ref 0–44)
AST: 51 U/L — ABNORMAL HIGH (ref 15–41)
Albumin: 3.6 g/dL (ref 3.5–5.0)
Alkaline Phosphatase: 152 U/L — ABNORMAL HIGH (ref 38–126)
Anion gap: 7 (ref 5–15)
BUN: 13 mg/dL (ref 8–23)
CO2: 25 mmol/L (ref 22–32)
Calcium: 8.9 mg/dL (ref 8.9–10.3)
Chloride: 105 mmol/L (ref 98–111)
Creatinine: 0.92 mg/dL (ref 0.61–1.24)
GFR, Estimated: >60 mL/min (ref >60)
Glucose, Bld: 165 mg/dL — ABNORMAL HIGH (ref 70–99)
Potassium: 3.8 mmol/L (ref 3.5–5.1)
Sodium: 137 mmol/L (ref 135–145)
Total Bilirubin: 0.7 mg/dL (ref 0.3–1.2)
Total Protein: 6.7 g/dL (ref 6.5–8.1)

## 2020-07-09 MED ORDER — SODIUM CHLORIDE 0.9% FLUSH
10.0000 mL | INTRAVENOUS | Status: DC | PRN
  Administered 2020-07-09: 10 mL via INTRAVENOUS
  Filled 2020-07-09: qty 10

## 2020-07-09 MED ORDER — HEPARIN SOD (PORK) LOCK FLUSH 100 UNIT/ML IV SOLN
500.0000 [IU] | Freq: Once | INTRAVENOUS | Status: AC
  Administered 2020-07-09: 500 [IU] via INTRAVENOUS
  Filled 2020-07-09: qty 5

## 2020-07-09 NOTE — Progress Notes (Signed)
Hematology and Oncology Follow Up Visit  Jonathan Lewis [id number] 26-Jul-1940 80 y.o. 07/09/2020   Principle Diagnosis:  Stage IIIA (T3N2M0) adenocarcinoma of the stomach -- S/p distal gastrectomy on 03/19/2019 -- HER2-/PD-L1+ -- metastatic  Current Therapy: Taxol/Cyamza -- start cycle #1 on 07/20/2020  Adjuvant FOLFOX -started on 05/13/2019, s/p cycle#8 -completed on Dec 11, 2019   Interim History:  Jonathan Lewis is here today for follow-up.  Unfortunately, we clearly do have metastatic disease now.  He did have a PET scan that was done.  PET scan was done back in late March.  PET scan showed new areas of metastatic disease within the liver, omentum, pancreatic body.  Thankfully, there was no ascites.  He is not having any abdominal pain.  He is eating okay.  His weight is holding steady.  There is been no bleeding.  He has had no chest pain.  Has had no leg swelling.  He comes in with his wife.  I talked to both of them.  I explained that the cancer has come back.  He last completed chemotherapy about 7 months ago.  I think that it would be worthwhile to try systemic chemotherapy again.  He still has a good performance status.  I think that he would be reasonable to try Taxol/Cyramza.  I think this would be a reasonable combination that he would be able to tolerate.  His liver function studies are little bit elevated.  I suppose this could be secondary to the underlying malignancy.  Currently, I would say his performance status is ECOG 1.    Medications:  Allergies as of 07/09/2020   No Known Allergies     Medication List       Accurate as of July 09, 2020  5:14 PM. If you have any questions, ask your nurse or doctor.        atorvastatin 20 MG tablet Commonly known as: Lipitor Take 1 tablet (20 mg total) by mouth daily.   docusate sodium 100 MG capsule Commonly known as: COLACE TAKE 1 CAPSULE BY MOUTH EVERY DAY   famotidine 20 MG tablet Commonly known  as: PEPCID Take 1 tablet (20 mg total) by mouth daily. What changed: when to take this   gabapentin 100 MG capsule Commonly known as: NEURONTIN Take 1 capsule (100 mg total) by mouth every 12 (twelve) hours. What changed:   when to take this  reasons to take this   latanoprost 0.005 % ophthalmic solution Commonly known as: XALATAN Place 1 drop into both eyes at bedtime.   lidocaine-prilocaine cream Commonly known as: EMLA Apply 1 application topically as needed. Place on the port one hour before appointment.   meclizine 12.5 MG tablet Commonly known as: ANTIVERT Take 1 tablet (12.5 mg total) by mouth 3 (three) times daily as needed for dizziness.   methocarbamol 500 MG tablet Commonly known as: ROBAXIN Take 1 tablet (500 mg total) by mouth every 6 (six) hours as needed for muscle spasms.   multivitamin with minerals Tabs tablet Take 1 tablet by mouth daily.   oxyCODONE-acetaminophen 5-325 MG tablet Commonly known as: PERCOCET/ROXICET Take 1-2 tablets by mouth every 4 (four) hours as needed for moderate pain or severe pain.   Systane Ultra 0.4-0.3 % Soln Generic drug: Polyethyl Glycol-Propyl Glycol Apply 1 drop to eye 3 (three) times daily as needed (dry eyes).   traMADol 50 MG tablet Commonly known as: ULTRAM Take 2 tablets (100 mg total) by mouth at bedtime as needed for  moderate pain or severe pain.       Allergies: No Known Allergies  Past Medical History, Surgical history, Social history, and Family History were reviewed and updated.  Review of Systems: Review of Systems  Constitutional: Negative.   HENT: Negative.   Eyes: Negative.   Respiratory: Negative.   Cardiovascular: Negative.   Gastrointestinal: Positive for abdominal pain.  Genitourinary: Negative.   Musculoskeletal: Negative.   Skin: Negative.   Neurological: Positive for tingling.  Endo/Heme/Allergies: Negative.   Psychiatric/Behavioral: Negative.      Physical Exam:  weight is 188  lb (85.3 kg). His oral temperature is 98.7 F (37.1 C). His blood pressure is 128/76 and his pulse is 69. His respiration is 18 and oxygen saturation is 100%.   Wt Readings from Last 3 Encounters:  07/09/20 188 lb (85.3 kg)  06/12/20 187 lb (84.8 kg)  05/29/20 186 lb (84.4 kg)    Physical Activity: Not on file    Physical Exam Vitals reviewed.  HENT:     Head: Normocephalic and atraumatic.  Eyes:     Pupils: Pupils are equal, round, and reactive to light.  Cardiovascular:     Rate and Rhythm: Normal rate and regular rhythm.     Heart sounds: Normal heart sounds.  Pulmonary:     Effort: Pulmonary effort is normal.     Breath sounds: Normal breath sounds.  Abdominal:     General: Bowel sounds are normal.     Palpations: Abdomen is soft.     Comments: Abdominal exam is soft.  He has well-healed laparotomy scars.  There is no tenderness to palpation.  He has no guarding or rebound tenderness.  There is no fluid wave.  There is no palpable liver or spleen tip.  Musculoskeletal:        General: No tenderness or deformity. Normal range of motion.     Cervical back: Normal range of motion.  Lymphadenopathy:     Cervical: No cervical adenopathy.  Skin:    General: Skin is warm and dry.     Findings: No erythema or rash.  Neurological:     Mental Status: He is alert and oriented to person, place, and time.  Psychiatric:        Behavior: Behavior normal.        Thought Content: Thought content normal.        Judgment: Judgment normal.     Lab Results  Component Value Date   WBC 4.3 07/09/2020   HGB 11.4 (L) 07/09/2020   HCT 34.8 (L) 07/09/2020   MCV 87.4 07/09/2020   PLT 180 07/09/2020   Lab Results  Component Value Date   FERRITIN 37 09/25/2019   IRON 98 09/25/2019   TIBC 328 09/25/2019   UIBC 230 09/25/2019   IRONPCTSAT 30 09/25/2019   Lab Results  Component Value Date   RBC 3.98 (L) 07/09/2020   No results found for: KPAFRELGTCHN, LAMBDASER, KAPLAMBRATIO No  results found for: IGGSERUM, IGA, IGMSERUM No results found for: Odetta Pink, SPEI   Chemistry      Component Value Date/Time   NA 137 07/09/2020 1609   K 3.8 07/09/2020 1609   CL 105 07/09/2020 1609   CO2 25 07/09/2020 1609   BUN 13 07/09/2020 1609   CREATININE 0.92 07/09/2020 1609   CREATININE 1.06 07/16/2012 1038      Component Value Date/Time   CALCIUM 8.9 07/09/2020 1609   ALKPHOS 152 (H) 07/09/2020 1609  AST 51 (H) 07/09/2020 1609   ALT 53 (H) 07/09/2020 1609   BILITOT 0.7 07/09/2020 1609       Impression and Plan: Mr. Fehnel is a very pleasant 80 yo African American gentleman with stage IIIA (T3N2M0) adenocarcinoma of the stomach, HER2-/PD-L1+. He had a distal gastrectomy on 12/15/2020andwas treated with adjuvant  FOLFOX.   He now has metastatic disease.  He still has a decent performance status.  I do think that Taxol/Cyramza would be a reasonable combination for him.  His initial molecular markers really were all negative.  He was HER-2 negative.  I do not think we have to get a biopsy on him at this point time.  One issue that we have to remember is that he is a Theme park manager.  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  He wants to be able to lead his church, particularly with the Easter holiday coming up next week.  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  I do think with Taxol/Cyramza we would be able to keep him active.  Treatment would be weekly for 3 weeks on and 1 week off.  I explained all this to Mr. Dudzik.  I explained the side effects.  He and his wife understand this completely.  I told him that we can treat this cancer but we are not going to cure it.  I would give him the 2 cycles of treatment and then we would see about his scans.  I would like to see him back when he has his second cycle of treatment.   Volanda Napoleon,  MD 4/7/20225:14 PM

## 2020-07-09 NOTE — Progress Notes (Signed)
DISCONTINUE ON PATHWAY REGIMEN - Gastroesophageal     A cycle is every 14 days:     Oxaliplatin      Leucovorin      Fluorouracil      Fluorouracil   **Always confirm dose/schedule in your pharmacy ordering system**  REASON: Other Reason PRIOR TREATMENT: GEOS12: mFOLFOX6 q14 Days x 6 Months TREATMENT RESPONSE: Complete Response (CR)  START ON PATHWAY REGIMEN - Gastroesophageal     A cycle is every 28 days:     Ramucirumab      Paclitaxel   **Always confirm dose/schedule in your pharmacy ordering system**  Patient Characteristics: Distant Metastases (cM1/pM1) / Locally Recurrent Disease, Adenocarcinoma - Esophageal, GE Junction, and Gastric, Second Line, MSS/pMMR or MSI Unknown Histology: Adenocarcinoma Disease Classification: Gastric Therapeutic Status: Distant Metastases (No Additional Staging) Line of Therapy: Second Line Microsatellite/Mismatch Repair Status: MSS/pMMR Intent of Therapy: Non-Curative / Palliative Intent, Discussed with Patient

## 2020-07-10 LAB — LACTATE DEHYDROGENASE: LDH: 136 U/L (ref 98–192)

## 2020-07-10 LAB — IRON AND TIBC
Iron: 114 ug/dL (ref 42–163)
Saturation Ratios: 33 % (ref 20–55)
TIBC: 346 ug/dL (ref 202–409)
UIBC: 232 ug/dL (ref 117–376)

## 2020-07-10 LAB — CANCER ANTIGEN 19-9: CA 19-9: 22 U/mL (ref 0–35)

## 2020-07-10 LAB — CEA (IN HOUSE-CHCC): CEA (CHCC-In House): 1.49 ng/mL (ref 0.00–5.00)

## 2020-07-10 LAB — FERRITIN: Ferritin: 56 ng/mL (ref 24–336)

## 2020-07-23 ENCOUNTER — Inpatient Hospital Stay: Payer: Medicare PPO

## 2020-07-23 ENCOUNTER — Other Ambulatory Visit: Payer: Medicare PPO

## 2020-07-23 ENCOUNTER — Other Ambulatory Visit: Payer: Self-pay

## 2020-07-23 VITALS — BP 133/80 | HR 60 | Temp 98.1°F | Resp 15

## 2020-07-23 DIAGNOSIS — R109 Unspecified abdominal pain: Secondary | ICD-10-CM | POA: Diagnosis not present

## 2020-07-23 DIAGNOSIS — C169 Malignant neoplasm of stomach, unspecified: Secondary | ICD-10-CM | POA: Diagnosis not present

## 2020-07-23 DIAGNOSIS — C162 Malignant neoplasm of body of stomach: Secondary | ICD-10-CM

## 2020-07-23 DIAGNOSIS — C787 Secondary malignant neoplasm of liver and intrahepatic bile duct: Secondary | ICD-10-CM | POA: Diagnosis not present

## 2020-07-23 DIAGNOSIS — C7889 Secondary malignant neoplasm of other digestive organs: Secondary | ICD-10-CM | POA: Diagnosis not present

## 2020-07-23 DIAGNOSIS — C163 Malignant neoplasm of pyloric antrum: Secondary | ICD-10-CM

## 2020-07-23 DIAGNOSIS — C786 Secondary malignant neoplasm of retroperitoneum and peritoneum: Secondary | ICD-10-CM | POA: Diagnosis not present

## 2020-07-23 DIAGNOSIS — Z79899 Other long term (current) drug therapy: Secondary | ICD-10-CM | POA: Diagnosis not present

## 2020-07-23 DIAGNOSIS — Z5112 Encounter for antineoplastic immunotherapy: Secondary | ICD-10-CM | POA: Diagnosis not present

## 2020-07-23 DIAGNOSIS — Z5111 Encounter for antineoplastic chemotherapy: Secondary | ICD-10-CM | POA: Diagnosis not present

## 2020-07-23 LAB — CBC WITH DIFFERENTIAL (CANCER CENTER ONLY)
Abs Immature Granulocytes: 0.01 10*3/uL (ref 0.00–0.07)
Basophils Absolute: 0 10*3/uL (ref 0.0–0.1)
Basophils Relative: 0 %
Eosinophils Absolute: 0.1 10*3/uL (ref 0.0–0.5)
Eosinophils Relative: 2 %
HCT: 34 % — ABNORMAL LOW (ref 39.0–52.0)
Hemoglobin: 11.1 g/dL — ABNORMAL LOW (ref 13.0–17.0)
Immature Granulocytes: 0 %
Lymphocytes Relative: 44 %
Lymphs Abs: 1.5 10*3/uL (ref 0.7–4.0)
MCH: 28.5 pg (ref 26.0–34.0)
MCHC: 32.6 g/dL (ref 30.0–36.0)
MCV: 87.4 fL (ref 80.0–100.0)
Monocytes Absolute: 0.3 10*3/uL (ref 0.1–1.0)
Monocytes Relative: 10 %
Neutro Abs: 1.5 10*3/uL — ABNORMAL LOW (ref 1.7–7.7)
Neutrophils Relative %: 44 %
Platelet Count: 169 10*3/uL (ref 150–400)
RBC: 3.89 MIL/uL — ABNORMAL LOW (ref 4.22–5.81)
RDW: 13.2 % (ref 11.5–15.5)
WBC Count: 3.3 10*3/uL — ABNORMAL LOW (ref 4.0–10.5)
nRBC: 0 % (ref 0.0–0.2)

## 2020-07-23 LAB — CMP (CANCER CENTER ONLY)
ALT: 46 U/L — ABNORMAL HIGH (ref 0–44)
AST: 50 U/L — ABNORMAL HIGH (ref 15–41)
Albumin: 3.8 g/dL (ref 3.5–5.0)
Alkaline Phosphatase: 168 U/L — ABNORMAL HIGH (ref 38–126)
Anion gap: 8 (ref 5–15)
BUN: 15 mg/dL (ref 8–23)
CO2: 26 mmol/L (ref 22–32)
Calcium: 9.4 mg/dL (ref 8.9–10.3)
Chloride: 105 mmol/L (ref 98–111)
Creatinine: 0.89 mg/dL (ref 0.61–1.24)
GFR, Estimated: >60 mL/min (ref >60)
Glucose, Bld: 170 mg/dL — ABNORMAL HIGH (ref 70–99)
Potassium: 4 mmol/L (ref 3.5–5.1)
Sodium: 139 mmol/L (ref 135–145)
Total Bilirubin: 0.4 mg/dL (ref 0.3–1.2)
Total Protein: 6.7 g/dL (ref 6.5–8.1)

## 2020-07-23 LAB — TOTAL PROTEIN, URINE DIPSTICK: Protein, ur: NEGATIVE mg/dL

## 2020-07-23 LAB — TSH: TSH: 2.472 u[IU]/mL (ref 0.320–4.118)

## 2020-07-23 MED ORDER — ONDANSETRON HCL 8 MG PO TABS: 8.0000 mg | ORAL_TABLET | Freq: Two times a day (BID) | ORAL | 1 refills | Status: DC | PRN

## 2020-07-23 MED ORDER — SODIUM CHLORIDE 0.9 % IV SOLN
80.0000 mg/m2 | Freq: Once | INTRAVENOUS | Status: AC
  Administered 2020-07-23: 162 mg via INTRAVENOUS
  Filled 2020-07-23: qty 27

## 2020-07-23 MED ORDER — HEPARIN SOD (PORK) LOCK FLUSH 100 UNIT/ML IV SOLN
500.0000 [IU] | Freq: Once | INTRAVENOUS | Status: AC | PRN
  Administered 2020-07-23: 500 [IU]
  Filled 2020-07-23: qty 5

## 2020-07-23 MED ORDER — SODIUM CHLORIDE 0.9 % IV SOLN
Freq: Once | INTRAVENOUS | Status: AC
Start: 2020-07-23 — End: 2020-07-23
  Filled 2020-07-23: qty 250

## 2020-07-23 MED ORDER — FAMOTIDINE IN NACL 20-0.9 MG/50ML-% IV SOLN
INTRAVENOUS | Status: AC
  Filled 2020-07-23: qty 50

## 2020-07-23 MED ORDER — SODIUM CHLORIDE 0.9 % IV SOLN
20.0000 mg | Freq: Once | INTRAVENOUS | Status: AC
  Administered 2020-07-23: 20 mg via INTRAVENOUS
  Filled 2020-07-23: qty 20

## 2020-07-23 MED ORDER — DIPHENHYDRAMINE HCL 50 MG/ML IJ SOLN
INTRAMUSCULAR | Status: AC
  Filled 2020-07-23: qty 1

## 2020-07-23 MED ORDER — SODIUM CHLORIDE 0.9 % IV SOLN
8.0000 mg/kg | Freq: Once | INTRAVENOUS | Status: AC
  Administered 2020-07-23: 700 mg via INTRAVENOUS
  Filled 2020-07-23: qty 50

## 2020-07-23 MED ORDER — SODIUM CHLORIDE 0.9% FLUSH
10.0000 mL | INTRAVENOUS | Status: DC | PRN
  Administered 2020-07-23: 10 mL
  Filled 2020-07-23: qty 10

## 2020-07-23 MED ORDER — FAMOTIDINE IN NACL 20-0.9 MG/50ML-% IV SOLN
20.0000 mg | Freq: Once | INTRAVENOUS | Status: AC
  Administered 2020-07-23: 20 mg via INTRAVENOUS

## 2020-07-23 MED ORDER — DIPHENHYDRAMINE HCL 50 MG/ML IJ SOLN
50.0000 mg | Freq: Once | INTRAMUSCULAR | Status: AC
Start: 2020-07-23 — End: 2020-07-23
  Administered 2020-07-23: 50 mg via INTRAVENOUS

## 2020-07-23 NOTE — Patient Instructions (Signed)

## 2020-07-23 NOTE — Patient Instructions (Signed)
Ramucirumab injection What is this medicine? RAMUCIRUMAB (ra mue SIR ue mab) is a monoclonal antibody. It is used to treat stomach cancer, colorectal cancer, liver cancer, and lung cancer. This medicine may be used for other purposes; ask your health care provider or pharmacist if you have questions. COMMON BRAND NAME(S): Cyramza What should I tell my health care provider before I take this medicine? They need to know if you have any of these conditions:  bleeding disorders  blood clots  heart disease, including heart failure, heart attack, or chest pain (angina)  high blood pressure  infection (especially a virus infection such as chickenpox, cold sores, or herpes)  protein in your urine  recent or planning to have surgery  stroke  an unusual or allergic reaction to ramucirumab, other medicines, foods, dyes, or preservatives  pregnant or trying to get pregnant  breast-feeding How should I use this medicine? This medicine is for infusion into a vein. It is given by a health care professional in a hospital or clinic setting. Talk to your pediatrician regarding the use of this medicine in children. Special care may be needed. Overdosage: If you think you have taken too much of this medicine contact a poison control center or emergency room at once. NOTE: This medicine is only for you. Do not share this medicine with others. What if I miss a dose? It is important not to miss your dose. Call your doctor or health care professional if you are unable to keep an appointment. What may interact with this medicine? Interactions have not been studied. This list may not describe all possible interactions. Give your health care provider a list of all the medicines, herbs, non-prescription drugs, or dietary supplements you use. Also tell them if you smoke, drink alcohol, or use illegal drugs. Some items may interact with your medicine. What should I watch for while using this medicine? Your  condition will be monitored carefully while you are receiving this medicine. You will need to to check your blood pressure and have your blood and urine tested while you are taking this medicine. Your condition will be monitored carefully while you are receiving this medicine. This medicine may increase your risk to bruise or bleed. Call your doctor or health care professional if you notice any unusual bleeding. Before having surgery, talk to your health care provider to make sure it is ok. This drug can increase the risk of poor healing of your surgical site or wound. You will need to stop this drug for 28 days before surgery. After surgery, wait at least 2 weeks before restarting this drug. Make sure the surgical site or wound is healed enough before restarting this drug. Talk to your health care provider if questions. Do not become pregnant while taking this medicine or for 3 months after stopping it. Women should inform their doctor if they wish to become pregnant or think they might be pregnant. There is a potential for serious side effects to an unborn child. Talk to your health care professional or pharmacist for more information. Do not breast-feed an infant while taking this medicine or for 2 months after stopping it. This medicine may interfere with the ability to have a child. Talk with your doctor or health care professional if you are concerned about your fertility. What side effects may I notice from receiving this medicine? Side effects that you should report to your doctor or health care professional as soon as possible:  allergic reactions like skin rash,  itching or hives, breathing problems, swelling of the face, lips, or tongue  signs of infection - fever or chills, cough, sore throat  chest pain or chest tightness  confusion  dizziness  feeling faint or lightheaded, falls  severe abdominal pain  severe nausea, vomiting  signs and symptoms of bleeding such as bloody or  black, tarry stools; red or dark-brown urine; spitting up blood or brown material that looks like coffee grounds; red spots on the skin; unusual bruising or bleeding from the eye, gums, or nose  signs and symptoms of a blood clot such as breathing problems; changes in vision; chest pain; severe, sudden headache; pain, swelling, warmth in the leg; trouble speaking; sudden numbness or weakness of the face, arm or leg  symptoms of a stroke: change in mental awareness, inability to talk or move one side of the body  trouble walking, dizziness, loss of balance or coordination Side effects that usually do not require medical attention (report to your doctor or health care professional if they continue or are bothersome):  cold, clammy skin  constipation  diarrhea  headache  nausea, vomiting  stomach pain  unusually slow heartbeat  unusually weak or tired This list may not describe all possible side effects. Call your doctor for medical advice about side effects. You may report side effects to FDA at 1-800-FDA-1088. Where should I keep my medicine? This drug is given in a hospital or clinic and will not be stored at home. NOTE: This sheet is a summary. It may not cover all possible information. If you have questions about this medicine, talk to your doctor, pharmacist, or health care provider.  2021 Elsevier/Gold Standard (2019-01-16 11:17:50) Paclitaxel injection What is this medicine? PACLITAXEL (PAK li TAX el) is a chemotherapy drug. It targets fast dividing cells, like cancer cells, and causes these cells to die. This medicine is used to treat ovarian cancer, breast cancer, lung cancer, Kaposi's sarcoma, and other cancers. This medicine may be used for other purposes; ask your health care provider or pharmacist if you have questions. COMMON BRAND NAME(S): Onxol, Taxol What should I tell my health care provider before I take this medicine? They need to know if you have any of these  conditions:  history of irregular heartbeat  liver disease  low blood counts, like low white cell, platelet, or red cell counts  lung or breathing disease, like asthma  tingling of the fingers or toes, or other nerve disorder  an unusual or allergic reaction to paclitaxel, alcohol, polyoxyethylated castor oil, other chemotherapy, other medicines, foods, dyes, or preservatives  pregnant or trying to get pregnant  breast-feeding How should I use this medicine? This drug is given as an infusion into a vein. It is administered in a hospital or clinic by a specially trained health care professional. Talk to your pediatrician regarding the use of this medicine in children. Special care may be needed. Overdosage: If you think you have taken too much of this medicine contact a poison control center or emergency room at once. NOTE: This medicine is only for you. Do not share this medicine with others. What if I miss a dose? It is important not to miss your dose. Call your doctor or health care professional if you are unable to keep an appointment. What may interact with this medicine? Do not take this medicine with any of the following medications:  live virus vaccines This medicine may also interact with the following medications:  antiviral medicines for hepatitis,  HIV or AIDS  certain antibiotics like erythromycin and clarithromycin  certain medicines for fungal infections like ketoconazole and itraconazole  certain medicines for seizures like carbamazepine, phenobarbital, phenytoin  gemfibrozil  nefazodone  rifampin  St. John's wort This list may not describe all possible interactions. Give your health care provider a list of all the medicines, herbs, non-prescription drugs, or dietary supplements you use. Also tell them if you smoke, drink alcohol, or use illegal drugs. Some items may interact with your medicine. What should I watch for while using this medicine? Your  condition will be monitored carefully while you are receiving this medicine. You will need important blood work done while you are taking this medicine. This medicine can cause serious allergic reactions. To reduce your risk you will need to take other medicine(s) before treatment with this medicine. If you experience allergic reactions like skin rash, itching or hives, swelling of the face, lips, or tongue, tell your doctor or health care professional right away. In some cases, you may be given additional medicines to help with side effects. Follow all directions for their use. This drug may make you feel generally unwell. This is not uncommon, as chemotherapy can affect healthy cells as well as cancer cells. Report any side effects. Continue your course of treatment even though you feel ill unless your doctor tells you to stop. Call your doctor or health care professional for advice if you get a fever, chills or sore throat, or other symptoms of a cold or flu. Do not treat yourself. This drug decreases your body's ability to fight infections. Try to avoid being around people who are sick. This medicine may increase your risk to bruise or bleed. Call your doctor or health care professional if you notice any unusual bleeding. Be careful brushing and flossing your teeth or using a toothpick because you may get an infection or bleed more easily. If you have any dental work done, tell your dentist you are receiving this medicine. Avoid taking products that contain aspirin, acetaminophen, ibuprofen, naproxen, or ketoprofen unless instructed by your doctor. These medicines may hide a fever. Do not become pregnant while taking this medicine. Women should inform their doctor if they wish to become pregnant or think they might be pregnant. There is a potential for serious side effects to an unborn child. Talk to your health care professional or pharmacist for more information. Do not breast-feed an infant while  taking this medicine. Men are advised not to father a child while receiving this medicine. This product may contain alcohol. Ask your pharmacist or healthcare provider if this medicine contains alcohol. Be sure to tell all healthcare providers you are taking this medicine. Certain medicines, like metronidazole and disulfiram, can cause an unpleasant reaction when taken with alcohol. The reaction includes flushing, headache, nausea, vomiting, sweating, and increased thirst. The reaction can last from 30 minutes to several hours. What side effects may I notice from receiving this medicine? Side effects that you should report to your doctor or health care professional as soon as possible:  allergic reactions like skin rash, itching or hives, swelling of the face, lips, or tongue  breathing problems  changes in vision  fast, irregular heartbeat  high or low blood pressure  mouth sores  pain, tingling, numbness in the hands or feet  signs of decreased platelets or bleeding - bruising, pinpoint red spots on the skin, black, tarry stools, blood in the urine  signs of decreased red blood cells - unusually  weak or tired, feeling faint or lightheaded, falls  signs of infection - fever or chills, cough, sore throat, pain or difficulty passing urine  signs and symptoms of liver injury like dark yellow or brown urine; general ill feeling or flu-like symptoms; light-colored stools; loss of appetite; nausea; right upper belly pain; unusually weak or tired; yellowing of the eyes or skin  swelling of the ankles, feet, hands  unusually slow heartbeat Side effects that usually do not require medical attention (report to your doctor or health care professional if they continue or are bothersome):  diarrhea  hair loss  loss of appetite  muscle or joint pain  nausea, vomiting  pain, redness, or irritation at site where injected  tiredness This list may not describe all possible side effects.  Call your doctor for medical advice about side effects. You may report side effects to FDA at 1-800-FDA-1088. Where should I keep my medicine? This drug is given in a hospital or clinic and will not be stored at home. NOTE: This sheet is a summary. It may not cover all possible information. If you have questions about this medicine, talk to your doctor, pharmacist, or health care provider.  2021 Elsevier/Gold Standard (2019-02-20 13:37:23)

## 2020-07-24 LAB — T4: T4, Total: 7.6 ug/dL (ref 4.5–12.0)

## 2020-07-30 ENCOUNTER — Inpatient Hospital Stay: Payer: Medicare PPO

## 2020-07-30 ENCOUNTER — Inpatient Hospital Stay (HOSPITAL_BASED_OUTPATIENT_CLINIC_OR_DEPARTMENT_OTHER): Payer: Medicare PPO | Admitting: Hematology & Oncology

## 2020-07-30 ENCOUNTER — Telehealth: Payer: Self-pay

## 2020-07-30 ENCOUNTER — Other Ambulatory Visit: Payer: Self-pay

## 2020-07-30 ENCOUNTER — Other Ambulatory Visit: Payer: Self-pay | Admitting: *Deleted

## 2020-07-30 ENCOUNTER — Encounter: Payer: Self-pay | Admitting: Hematology & Oncology

## 2020-07-30 VITALS — BP 121/93 | HR 64 | Temp 97.8°F | Resp 16 | Wt 184.6 lb

## 2020-07-30 DIAGNOSIS — Z95828 Presence of other vascular implants and grafts: Secondary | ICD-10-CM

## 2020-07-30 DIAGNOSIS — Z5112 Encounter for antineoplastic immunotherapy: Secondary | ICD-10-CM | POA: Diagnosis not present

## 2020-07-30 DIAGNOSIS — Z5111 Encounter for antineoplastic chemotherapy: Secondary | ICD-10-CM | POA: Diagnosis not present

## 2020-07-30 DIAGNOSIS — Z79899 Other long term (current) drug therapy: Secondary | ICD-10-CM | POA: Diagnosis not present

## 2020-07-30 DIAGNOSIS — C7889 Secondary malignant neoplasm of other digestive organs: Secondary | ICD-10-CM | POA: Diagnosis not present

## 2020-07-30 DIAGNOSIS — C169 Malignant neoplasm of stomach, unspecified: Secondary | ICD-10-CM | POA: Diagnosis not present

## 2020-07-30 DIAGNOSIS — R109 Unspecified abdominal pain: Secondary | ICD-10-CM | POA: Diagnosis not present

## 2020-07-30 DIAGNOSIS — C162 Malignant neoplasm of body of stomach: Secondary | ICD-10-CM

## 2020-07-30 DIAGNOSIS — C163 Malignant neoplasm of pyloric antrum: Secondary | ICD-10-CM

## 2020-07-30 DIAGNOSIS — C787 Secondary malignant neoplasm of liver and intrahepatic bile duct: Secondary | ICD-10-CM | POA: Diagnosis not present

## 2020-07-30 DIAGNOSIS — C786 Secondary malignant neoplasm of retroperitoneum and peritoneum: Secondary | ICD-10-CM | POA: Diagnosis not present

## 2020-07-30 LAB — CMP (CANCER CENTER ONLY)
ALT: 52 U/L — ABNORMAL HIGH (ref 0–44)
AST: 54 U/L — ABNORMAL HIGH (ref 15–41)
Albumin: 3.8 g/dL (ref 3.5–5.0)
Alkaline Phosphatase: 151 U/L — ABNORMAL HIGH (ref 38–126)
Anion gap: 7 (ref 5–15)
BUN: 15 mg/dL (ref 8–23)
CO2: 27 mmol/L (ref 22–32)
Calcium: 9.1 mg/dL (ref 8.9–10.3)
Chloride: 105 mmol/L (ref 98–111)
Creatinine: 0.87 mg/dL (ref 0.61–1.24)
GFR, Estimated: >60 mL/min (ref >60)
Glucose, Bld: 159 mg/dL — ABNORMAL HIGH (ref 70–99)
Potassium: 4.3 mmol/L (ref 3.5–5.1)
Sodium: 139 mmol/L (ref 135–145)
Total Bilirubin: 0.5 mg/dL (ref 0.3–1.2)
Total Protein: 6.6 g/dL (ref 6.5–8.1)

## 2020-07-30 LAB — CBC WITH DIFFERENTIAL (CANCER CENTER ONLY)
Abs Immature Granulocytes: 0.01 10*3/uL (ref 0.00–0.07)
Basophils Absolute: 0 10*3/uL (ref 0.0–0.1)
Basophils Relative: 0 %
Eosinophils Absolute: 0.1 10*3/uL (ref 0.0–0.5)
Eosinophils Relative: 2 %
HCT: 32.9 % — ABNORMAL LOW (ref 39.0–52.0)
Hemoglobin: 10.9 g/dL — ABNORMAL LOW (ref 13.0–17.0)
Immature Granulocytes: 0 %
Lymphocytes Relative: 55 %
Lymphs Abs: 1.4 10*3/uL (ref 0.7–4.0)
MCH: 28.8 pg (ref 26.0–34.0)
MCHC: 33.1 g/dL (ref 30.0–36.0)
MCV: 86.8 fL (ref 80.0–100.0)
Monocytes Absolute: 0.2 10*3/uL (ref 0.1–1.0)
Monocytes Relative: 6 %
Neutro Abs: 1 10*3/uL — ABNORMAL LOW (ref 1.7–7.7)
Neutrophils Relative %: 37 %
Platelet Count: 179 10*3/uL (ref 150–400)
RBC: 3.79 MIL/uL — ABNORMAL LOW (ref 4.22–5.81)
RDW: 12.7 % (ref 11.5–15.5)
WBC Count: 2.6 10*3/uL — ABNORMAL LOW (ref 4.0–10.5)
nRBC: 0 % (ref 0.0–0.2)

## 2020-07-30 LAB — TSH: TSH: 3.551 u[IU]/mL (ref 0.320–4.118)

## 2020-07-30 LAB — TOTAL PROTEIN, URINE DIPSTICK: Protein, ur: NEGATIVE mg/dL

## 2020-07-30 MED ORDER — HEPARIN SOD (PORK) LOCK FLUSH 100 UNIT/ML IV SOLN
500.0000 [IU] | Freq: Once | INTRAVENOUS | Status: AC
  Administered 2020-07-30: 500 [IU] via INTRAVENOUS
  Filled 2020-07-30: qty 5

## 2020-07-30 MED ORDER — SODIUM CHLORIDE 0.9% FLUSH
10.0000 mL | Freq: Once | INTRAVENOUS | Status: AC
  Administered 2020-07-30: 10 mL via INTRAVENOUS
  Filled 2020-07-30: qty 10

## 2020-07-30 MED ORDER — SODIUM CHLORIDE 0.9% FLUSH
10.0000 mL | Freq: Once | INTRAVENOUS | Status: AC
Start: 2020-07-30 — End: 2020-07-30
  Administered 2020-07-30: 10 mL via INTRAVENOUS
  Filled 2020-07-30: qty 10

## 2020-07-30 NOTE — Patient Instructions (Signed)
Tunneled Central Venous Catheter Flushing Guide  It is important to flush your tunneled central venous catheter each time you use it, both before and after you use it. Flushing your catheter will help prevent it from clogging. What are the risks? Risks may include:  Infection.  Air getting into the catheter and bloodstream. Supplies needed:  A clean pair of gloves.  A disinfecting wipe. Use an alcohol wipe, chlorhexidine wipe, or iodine wipe as told by your health care provider.  A 10 mL syringe that has been prefilled with saline solution.  An empty 10 mL syringe, if a substance called heparin was injected into your catheter. How to flush your catheter When you flush your catheter, make sure you follow any specific instructions from your health care provider or the manufacturer. These are general guidelines. Flushing your catheter before use If there is heparin in your catheter: 1. Wash your hands with soap and water. 2. Put on gloves. 3. Scrub the injection cap for a minimum of 15 seconds with a disinfecting wipe. 4. Unclamp the catheter. 5. Attach the empty syringe to the injection cap. 6. Pull the syringe plunger back and withdraw 10 mL of blood. 7. Place the syringe into an appropriate waste container. 8. Scrub the injection cap for 15 seconds with a disinfecting wipe. 9. Attach the prefilled syringe to the injection cap. 10. Flush the catheter by pushing the plunger forward until all the liquid from the syringe is in the catheter. 11. Remove the syringe from the injection cap. 12. Clamp the catheter. If there is no heparin in your catheter: 1. Wash your hands with soap and water. 2. Put on gloves. 3. Scrub the injection cap for 15 seconds with a disinfecting wipe. 4. Unclamp the catheter. 5. Attach the prefilled syringe to the injection cap. 6. Flush the catheter by pushing the plunger forward until 5 mL of the liquid from the syringe is in the catheter. 7. Pull back on  the syringe until you see blood in the catheter. 8. If you have been asked to collect any blood, follow your health care provider's instructions. Otherwise, flush the catheter with the rest of the solution from the syringe. 9. Remove the syringe from the injection cap. 10. Clamp the catheter.   Flushing your catheter after use 1. Wash your hands with soap and water. 2. Put on gloves. 3. Scrub the injection cap for 15 seconds with a disinfecting wipe. 4. Unclamp the catheter. 5. Attach the prefilled syringe to the injection cap. 6. Flush the catheter by pushing the plunger forward until all of the liquid from the syringe is in the catheter. 7. Remove the syringe from the injection cap. 8. Clamp the catheter. Problems and solutions  If blood cannot be completely cleared from the injection cap, you may need to have the injection cap replaced.  If the catheter is difficult to flush, use the pulsing method. The pulsing method involves pushing only a few milliliters of solution into the catheter at a time and pausing between pushes.  If you do not see blood in the catheter when you pull back on the syringe, change your body position, such as by raising your arms above your head. Take a deep breath and cough. Then, pull back on the syringe. If you still do not see blood, flush the catheter with a small amount of solution. Then, change positions again and take a breath or cough. Pull back on the syringe again. If you still do not   see blood, finish flushing the catheter and contact your health care provider. Do not use your catheter until your health care provider says it is okay. General tips  Have someone help you flush your catheter, if possible.  Do not force fluid through your catheter.  Do not use a syringe that is larger or smaller than 10 mL. Using a smaller syringe can make the catheter burst.  Do not use your catheter without flushing it first if it has heparin in it. Contact a health  care provider if:  You cannot see any blood in the catheter when you flush it before using it.  Your catheter is difficult to flush. Get help right away if:  You cannot flush the catheter.  The catheter leaks when you flush it or when there is fluid in it.  There are cracks or breaks in the catheter. Summary  It is important to flush your tunneled central venous catheter each time you use it, both before and after you use it.  Scrub the injection cap for 15 seconds with a disinfecting wipe before and after you flush it.  When you flush your catheter, make sure you follow any specific instructions from your health care provider or the manufacturer.  Get help right away if you cannot flush the catheter. This information is not intended to replace advice given to you by your health care provider. Make sure you discuss any questions you have with your health care provider. Document Revised: 05/30/2019 Document Reviewed: 06/06/2018 Elsevier Patient Education  2021 Elsevier Inc.  

## 2020-07-30 NOTE — Progress Notes (Signed)
Hematology and Oncology Follow Up Visit  Jonathan Lewis [id number] 1940/06/06 80 y.o. 07/30/2020   Principle Diagnosis:  Stage IIIA (T3N2M0) adenocarcinoma of the stomach -- S/p distal gastrectomy on 03/19/2019 -- HER2-/PD-L1+ -- metastatic  Current Therapy: Taxol/Cyramza -- start cycle #1 on 07/20/2020  Adjuvant FOLFOX -started on 05/13/2019, s/p cycle#8 -completed on Dec 11, 2019   Interim History:  Jonathan Lewis is here today for treatment.  However, there has been some problems with GI bleeding.  He says he has had some blood per rectum.  This happened several days ago.  He says it does not happen all the time.  There is no abdominal pain.  He has had little bit of discomfort in the upper abdomen.  He is eating okay.  The bleeding is not associated with any food.  He has had no fever.  There has been no nausea or vomiting.  He has had no mouth sores.  I worry that the bleeding might be from the [place].  Again have to get Gastroenterology to see him.  I think he probably needs to have a scope done to see what might be going on.  I know he has had abdominal surgery for the gastric cancer.  I do not know if there might be the possibility of some weakness at the anastomotic site.  Currently, his performance status is ECOG 1.     Medications:  Allergies as of 07/30/2020   No Known Allergies     Medication List       Accurate as of July 30, 2020 12:22 PM. If you have any questions, ask your nurse or doctor.        STOP taking these medications   oxyCODONE-acetaminophen 5-325 MG tablet Commonly known as: PERCOCET/ROXICET Stopped by: Volanda Napoleon, MD     TAKE these medications   atorvastatin 20 MG tablet Commonly known as: Lipitor Take 1 tablet (20 mg total) by mouth daily.   docusate sodium 100 MG capsule Commonly known as: COLACE TAKE 1 CAPSULE BY MOUTH EVERY DAY   famotidine 20 MG tablet Commonly known as: PEPCID Take 1 tablet (20 mg total) by  mouth daily.   gabapentin 100 MG capsule Commonly known as: NEURONTIN Take 1 capsule (100 mg total) by mouth every 12 (twelve) hours.   latanoprost 0.005 % ophthalmic solution Commonly known as: XALATAN Place 1 drop into both eyes at bedtime.   lidocaine-prilocaine cream Commonly known as: EMLA Apply 1 application topically as needed. Place on the port one hour before appointment.   meclizine 12.5 MG tablet Commonly known as: ANTIVERT Take 1 tablet (12.5 mg total) by mouth 3 (three) times daily as needed for dizziness.   methocarbamol 500 MG tablet Commonly known as: ROBAXIN Take 1 tablet (500 mg total) by mouth every 6 (six) hours as needed for muscle spasms.   multivitamin with minerals Tabs tablet Take 1 tablet by mouth daily.   ondansetron 8 MG tablet Commonly known as: Zofran Take 1 tablet (8 mg total) by mouth 2 (two) times daily as needed (Nausea or vomiting).   Systane Ultra 0.4-0.3 % Soln Generic drug: Polyethyl Glycol-Propyl Glycol Apply 1 drop to eye 3 (three) times daily as needed (dry eyes).   traMADol 50 MG tablet Commonly known as: ULTRAM Take 2 tablets (100 mg total) by mouth at bedtime as needed for moderate pain or severe pain.       Allergies: No Known Allergies  Past Medical History, Surgical history, Social history,  and Family History were reviewed and updated.  Review of Systems: Review of Systems  Constitutional: Negative.   HENT: Negative.   Eyes: Negative.   Respiratory: Negative.   Cardiovascular: Negative.   Gastrointestinal: Positive for abdominal pain.  Genitourinary: Negative.   Musculoskeletal: Negative.   Skin: Negative.   Neurological: Positive for tingling.  Endo/Heme/Allergies: Negative.   Psychiatric/Behavioral: Negative.      Physical Exam:  weight is 184 lb 9.6 oz (83.7 kg). His oral temperature is 97.8 F (36.6 C). His blood pressure is 121/93 (abnormal) and his pulse is 64. His respiration is 16.   Wt Readings  from Last 3 Encounters:  07/30/20 184 lb 9.6 oz (83.7 kg)  07/09/20 188 lb (85.3 kg)  06/12/20 187 lb (84.8 kg)    Physical Activity: Not on file    Physical Exam Vitals reviewed.  HENT:     Head: Normocephalic and atraumatic.  Eyes:     Pupils: Pupils are equal, round, and reactive to light.  Cardiovascular:     Rate and Rhythm: Normal rate and regular rhythm.     Heart sounds: Normal heart sounds.  Pulmonary:     Effort: Pulmonary effort is normal.     Breath sounds: Normal breath sounds.  Abdominal:     General: Bowel sounds are normal.     Palpations: Abdomen is soft.     Comments: Abdominal exam is soft.  He has well-healed laparotomy scars.  There is no tenderness to palpation.  He has no guarding or rebound tenderness.  There is no fluid wave.  There is no palpable liver or spleen tip.  Genitourinary:    Comments: On his rectal exam, he does have an external hemorrhoid.  This is not thrombosed or bleeding.  His rectal vault is smooth.  He has very little stool.  It is heme positive. Musculoskeletal:        General: No tenderness or deformity. Normal range of motion.     Cervical back: Normal range of motion.  Lymphadenopathy:     Cervical: No cervical adenopathy.  Skin:    General: Skin is warm and dry.     Findings: No erythema or rash.  Neurological:     Mental Status: He is alert and oriented to person, place, and time.  Psychiatric:        Behavior: Behavior normal.        Thought Content: Thought content normal.        Judgment: Judgment normal.     Lab Results  Component Value Date   WBC 2.6 (L) 07/30/2020   HGB 10.9 (L) 07/30/2020   HCT 32.9 (L) 07/30/2020   MCV 86.8 07/30/2020   PLT 179 07/30/2020   Lab Results  Component Value Date   FERRITIN 56 07/09/2020   IRON 114 07/09/2020   TIBC 346 07/09/2020   UIBC 232 07/09/2020   IRONPCTSAT 33 07/09/2020   Lab Results  Component Value Date   RBC 3.79 (L) 07/30/2020   No results found for:  KPAFRELGTCHN, LAMBDASER, KAPLAMBRATIO No results found for: IGGSERUM, IGA, IGMSERUM No results found for: Ronnald Ramp, A1GS, A2GS, Tillman Sers, SPEI   Chemistry      Component Value Date/Time   NA 139 07/30/2020 0840   K 4.3 07/30/2020 0840   CL 105 07/30/2020 0840   CO2 27 07/30/2020 0840   BUN 15 07/30/2020 0840   CREATININE 0.87 07/30/2020 0840   CREATININE 1.06 07/16/2012 1038  Component Value Date/Time   CALCIUM 9.1 07/30/2020 0840   ALKPHOS 151 (H) 07/30/2020 0840   AST 54 (H) 07/30/2020 0840   ALT 52 (H) 07/30/2020 0840   BILITOT 0.5 07/30/2020 0840       Impression and Plan: Mr. Hilleary is a very pleasant 80 yo African American gentleman with stage IIIA (T3N2M0) adenocarcinoma of the stomach, HER2-/PD-L1+. He had a distal gastrectomy on 12/15/2020andwas treated with adjuvant  FOLFOX.   He now has metastatic disease.  He is on treatment with Taxol/Cyramza.  Unfortunately, I just have a feeling that the Cyramza might be a problem here.  One of the side effects is gastrointestinal bleeding.  Again we are not going to treat him today.  I really need to get the GI tract run.  We really need to get him to see gastroenterology.  We will see about, hopefully him getting a upper and lower endoscopy next week.  I will hold off for another couple weeks.   Volanda Napoleon, MD 4/28/202212:22 PM

## 2020-07-30 NOTE — Telephone Encounter (Signed)
Pt has been called with appts per 07/30/20 los, I cancelled the 5/5 and 5/16 appts and r/s them to 5/13     Jonathan Lewis

## 2020-07-31 ENCOUNTER — Telehealth: Payer: Self-pay

## 2020-07-31 LAB — T4: T4, Total: 8.7 ug/dL (ref 4.5–12.0)

## 2020-07-31 NOTE — Telephone Encounter (Signed)
Armbruster, Carlota Raspberry, MD  Yevette Edwards, RN [facility],  This patient's oncologist reached out for this patient to get seen ASAP. Do I have any appointments open next or, or do any of the PAs? If so please book him

## 2020-07-31 NOTE — Telephone Encounter (Signed)
Patient returned call. Confirmed appointment for 5/4 @ 11:30.

## 2020-07-31 NOTE — Telephone Encounter (Signed)
Patient has been scheduled for a follow up with Nicoletta Ba, PA on Wednesday, 08/05/20 at 11:30 AM.   Lm on home vm for patient to return call.

## 2020-08-05 ENCOUNTER — Other Ambulatory Visit (INDEPENDENT_AMBULATORY_CARE_PROVIDER_SITE_OTHER): Payer: Medicare PPO

## 2020-08-05 ENCOUNTER — Ambulatory Visit: Payer: Medicare PPO | Admitting: Physician Assistant

## 2020-08-05 ENCOUNTER — Encounter: Payer: Self-pay | Admitting: Physician Assistant

## 2020-08-05 VITALS — BP 120/74 | HR 62 | Ht 68.0 in | Wt 188.1 lb

## 2020-08-05 DIAGNOSIS — C162 Malignant neoplasm of body of stomach: Secondary | ICD-10-CM

## 2020-08-05 DIAGNOSIS — D509 Iron deficiency anemia, unspecified: Secondary | ICD-10-CM

## 2020-08-05 DIAGNOSIS — K625 Hemorrhage of anus and rectum: Secondary | ICD-10-CM

## 2020-08-05 LAB — CBC WITH DIFFERENTIAL/PLATELET
Basophils Absolute: 0 10*3/uL (ref 0.0–0.1)
Basophils Relative: 0.5 % (ref 0.0–3.0)
Eosinophils Absolute: 0 10*3/uL (ref 0.0–0.7)
Eosinophils Relative: 1.3 % (ref 0.0–5.0)
HCT: 34.8 % — ABNORMAL LOW (ref 39.0–52.0)
Hemoglobin: 11.7 g/dL — ABNORMAL LOW (ref 13.0–17.0)
Lymphocytes Relative: 55.7 % — ABNORMAL HIGH (ref 12.0–46.0)
Lymphs Abs: 1.9 10*3/uL (ref 0.7–4.0)
MCHC: 33.5 g/dL (ref 30.0–36.0)
MCV: 86.6 fl (ref 78.0–100.0)
Monocytes Absolute: 0.4 10*3/uL (ref 0.1–1.0)
Monocytes Relative: 12.1 % — ABNORMAL HIGH (ref 3.0–12.0)
Neutro Abs: 1.1 10*3/uL — ABNORMAL LOW (ref 1.4–7.7)
Neutrophils Relative %: 30.4 % — ABNORMAL LOW (ref 43.0–77.0)
Platelets: 222 10*3/uL (ref 150.0–400.0)
RBC: 4.02 Mil/uL — ABNORMAL LOW (ref 4.22–5.81)
RDW: 12.8 % (ref 11.5–15.5)
WBC: 3.5 10*3/uL — ABNORMAL LOW (ref 4.0–10.5)

## 2020-08-05 NOTE — Patient Instructions (Addendum)
If you are age 81 or older, your body mass index should be between 23-30. Your Body mass index is 28.6 kg/m. If this is out of the aforementioned range listed, please consider follow up with your Primary Care Provider.  You have been scheduled for an endoscopy. Please follow written instructions given to you at your visit today. If you use inhalers (even only as needed), please bring them with you on the day of your procedure.  Your provider has requested that you go to the basement level for lab work before leaving today. Press "B" on the elevator. The lab is located at the first door on the left as you exit the elevator.  Follow up pending the results of your Endoscopy.  Thank you for entrusting me with your care and choosing [facility].  Amy Esterwood, PA-C

## 2020-08-05 NOTE — Progress Notes (Addendum)
Agree with assessment / plan as outlined.  Self limited rectal bleeding with relatively stable Hgb, symptoms have since resolved. On Cyramza which can increase the risk for GI hemorrhage. Last colonoscopy 2017 without any high risk lesions, but now with metastatic gastric adenocarcinoma. Agree with EGD to assess for recurrent local disease and bleeding risk. Doing colonoscopy also reasonable if Jonathan Lewis is up for it and room to add into the schedule that day to assess his risk for rebleeding when Jonathan Lewis needs to resume chemotherapy. If Jonathan Lewis wishes to hold off on colonoscopy and just do  EGD I understand but if recurrent symptoms Jonathan Lewis will definitely need the colonoscopy.   UPDATE: There is no room in the endo schedule on Friday (2 days) to perform the colonoscopy. Will start with the EGD then to get that done first as that is the more pressing exam, and then await findings, if need be can try to add on colonoscopy next week

## 2020-08-05 NOTE — Progress Notes (Signed)
Subjective:    [name], male    DOB: 10/22/1940, 80 y.o.   MRN: [id number]  HPI Jonathan Lewis is a pleasant 80 year old African-American male, known to Dr. Havery Moros who is referred today per Dr. Marin Olp with concerns about recent bleeding. Patient was diagnosed with gastric adenocarcinoma in December 2020, T3N2 and underwent distal gastrectomy/Billroth II in December 2020.  He has been undergoing chemotherapy. He was seen by Dr. Marin Olp on 07/30/2020 and at that time had mentioned that he had seen some blood in his bowel movements over the previous 2 to 3 days.  He says this is red blood and was noted on the tissue and in the commode but did not appear to be mixed with the bowel movement though he cannot be sure.  He is unaware of any melena.  He says he was mildly constipated at the time.  He has not seen any blood over this past week.  He has no complaints of rectal pain or discomfort.  He is not having any ongoing abdominal pain though says he has had some chronic tightness and discomfort across the upper abdomen for some time. Appetite has been good weight has been stable no nausea or vomiting, no heartburn or indigestion. Labs on 07/30/2020 with hemoglobin 10.7 hematocrit 32.9 WBC 2.6/platelets 179. Labs March 2022 hemoglobin 11.8.  He has had some mild LFT elevation with AST 54 ALT of 52 and alk phos 151. Last imaging was PET scan done March 2022 which showed recurrent disease in the abdomen with new mets in the liver, there is evidence of omental metastatic disease and a probable metastatic lesion in the pancreas, stable mediastinal nodes.  Last EGD was done at the time of diagnosis December 2020, he also had grade D esophagitis at that time Last colonoscopy March 2017 with finding of external hemorrhoids, left colon diverticulosis and he had removal of 2 3 to 4 mm polyps both of which were tubular adenomas.  Review of Systems Pertinent positive and negative review of systems  were noted in the above HPI section.  All other review of systems was otherwise negative.  Outpatient Encounter Medications as of 08/05/2020  Medication Sig  . atorvastatin (LIPITOR) 20 MG tablet Take 1 tablet (20 mg total) by mouth daily.  Marland Kitchen latanoprost (XALATAN) 0.005 % ophthalmic solution Place 1 drop into both eyes at bedtime.   . lidocaine-prilocaine (EMLA) cream Apply 1 application topically as needed. Place on the port one hour before appointment.  . meclizine (ANTIVERT) 12.5 MG tablet Take 1 tablet (12.5 mg total) by mouth 3 (three) times daily as needed for dizziness.  . methocarbamol (ROBAXIN) 500 MG tablet Take 1 tablet (500 mg total) by mouth every 6 (six) hours as needed for muscle spasms.  . Multiple Vitamin (MULTIVITAMIN WITH MINERALS) TABS tablet Take 1 tablet by mouth daily.  . ondansetron (ZOFRAN) 8 MG tablet Take 1 tablet (8 mg total) by mouth 2 (two) times daily as needed (Nausea or vomiting).  . SYSTANE ULTRA 0.4-0.3 % SOLN Apply 1 drop to eye 3 (three) times daily as needed (dry eyes).   . traMADol (ULTRAM) 50 MG tablet Take 2 tablets (100 mg total) by mouth at bedtime as needed for moderate pain or severe pain.  . [DISCONTINUED] docusate sodium (COLACE) 100 MG capsule TAKE 1 CAPSULE BY MOUTH EVERY DAY (Patient not taking: No sig reported)  . [DISCONTINUED] famotidine (PEPCID) 20 MG tablet Take 1 tablet (20 mg total) by mouth daily. (Patient  not taking: No sig reported)  . [DISCONTINUED] gabapentin (NEURONTIN) 100 MG capsule Take 1 capsule (100 mg total) by mouth every 12 (twelve) hours. (Patient not taking: No sig reported)   No facility-administered encounter medications on file as of 08/05/2020.   No Known Allergies Patient Active Problem List   Diagnosis Date Noted  . Right knee pain 04/22/2020  . Myelopathy ([place]) 01/08/2020  . Iron deficiency anemia 06/04/2019  . Abnormal PET scan, mediastinum 05/29/2019  . Mediastinal adenopathy 05/22/2019  . Goals of care,  counseling/discussion 04/29/2019  . Gastric cancer pT3pN2 s/p distal gastrectomy with Billroth II gastrojejunostomy 03/19/2019 03/24/2019  . H/O exploratory laparotomy 03/19/2019  . Malnutrition of moderate degree 03/08/2019  . Gastric outlet obstruction 03/07/2019  . Weight loss 03/05/2019  . Nausea and vomiting 03/05/2019  . Decreased appetite 03/05/2019  . Epigastric pain 12/05/2018  . Hyperglycemia 12/05/2018  . DDD (degenerative disc disease), cervical 04/16/2018  . Spinal stenosis in cervical region 04/16/2018  . Lumbar radiculopathy 01/02/2018  . Leg pain, bilateral 11/08/2017  . Bursitis of right hip 05/10/2017  . Left shoulder pain 05/10/2017  . Physical deconditioning 05/10/2017  . Wheezing 12/13/2016  . Cough 11/03/2016  . Neck pain on left side 11/03/2016  . Bilateral leg paresthesia 11/10/2015  . Sensorineural hearing loss (SNHL) of both ears 07/27/2015  . Tinnitus of both ears 07/27/2015  . Left acoustic neuroma ([place]) 06/25/2015  . Hematochezia 05/13/2015  . Abnormal finding on MRI of brain 05/13/2015  . Vertigo 05/05/2015  . GERD (gastroesophageal reflux disease) 05/05/2015  . BPH (benign prostatic hyperplasia) 05/05/2015  . Bradycardia   . Chest pain 07/18/2014  . Allergic rhinitis 07/18/2014  . Pain of right thumb 07/18/2014  . Male hypogonadism 07/16/2014  . Encounter for well adult exam with abnormal findings 07/17/2013  . Right sided sciatica 07/17/2013  . Constipation 01/21/2012  . Leukopenia 06/11/2011  . HEARING LOSS 04/10/2009  . GROIN PAIN 01/26/2009  . Osteoarthritis, hand 10/27/2008  . LOW BACK PAIN, CHRONIC 10/27/2008  . Diabetes ([place]) 07/17/2008  . Hyperlipidemia 06/06/2007  . EXTERNAL HEMORRHOIDS 02/15/2007  . Benign nodular prostatic hyperplasia 02/15/2007  . ERECTILE DYSFUNCTION 10/31/2006  . Hypertension complicating diabetes ([place]) 10/31/2006   Social History   Socioeconomic History  . Marital status: Married    Spouse name: Not on  file  . Number of children: 7  . Years of education: 7  . Highest education level: Not on file  Occupational History  . Occupation: Mining engineer: RETIRED  Tobacco Use  . Smoking status: Former Smoker    Packs/day: 1.00    Years: 24.00    Pack years: 24.00    Types: Cigarettes    Quit date: 10/03/1978    Years since quitting: 41.8  . Smokeless tobacco: Never Used  Vaping Use  . Vaping Use: Never used  Substance and Sexual Activity  . Alcohol use: Not Currently  . Drug use: No  . Sexual activity: Yes    Partners: Female  Other Topics Concern  . Not on file  Social History Narrative   9th grade.  Married '60. [name] 3 sons -'38 '64, '66; 4 dtrs -'60, '62, '61, '65; 15 grands, 25 great-grands.   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   ACD- discussed living will and HCPOA (July '14) provided packet.          Social Determinants of Health   Financial Resource Strain: Not on file  Food Insecurity: Not  on file  Transportation Needs: Not on file  Physical Activity: Not on file  Stress: Not on file  Social Connections: Not on file  Intimate Partner Violence: Not on file    Jonathan Lewis family history includes Cancer in his brother and mother; Diabetes in his sister; Hyperlipidemia in his brother; Hypertension in his brother, mother, and sister; Stroke in his brother.      Objective:    Vitals:   08/05/20 1126  BP: 120/74  Pulse: 62    Physical Exam Well-developed well-nourished elderly AA male  in no acute distress.  Height, Weight,188 BMI 28.6  HEENT; nontraumatic normocephalic, EOMI, PE R LA, sclera anicteric. Oropharynx;not examined Neck; supple, no JVD Cardiovascular; regular rate and rhythm with S1-S2, no murmur rub or gallop Pulmonary; Clear bilaterally Abdomen; soft, no focal tenderness, some fullness in the mid abdomen, liver palpable 2 fingerbreadths below the right costal margin, midline incisional scar, no palpable mass or  hepatosplenomegaly, bowel sounds are active Rectal; small noninflamed external hemorrhoids, no palpable lesion on digital exam stool brown and Hemoccult negative Skin; benign exam, no jaundice rash or appreciable lesions Extremities; no clubbing cyanosis or edema skin warm and dry Neuro/Psych; alert and oriented x4, grossly nonfocal mood and affect appropriate       Assessment & Plan:   #29 80 year old African-American male with gastric adenocarcinoma diagnosed December 2020 and status post distal gastrectomy/Billroth II December 2020. He has been undergoing chemotherapy.  Most recent PET scan did show recurrent disease in the abdomen with a metastatic lesion in the liver, evidence of omental metastatic disease and a metastatic lesion in the pancreas.  Patient had episode of hematochezia about a week and a half ago with noticing red blood on the tissue and in the commode for a couple of days.  He did not notice any blood mixed with the bowel movements nor any melena.  No associated abdominal pain cramping nausea vomiting etc.  He had been somewhat constipated. Hemoglobin relatively stable at 10.7 at that time with previous value of 11.8 in March 2022  Patient has not seen any further bleeding over the past week.  He denies any change in abdominal symptoms, has had some chronic upper abdominal discomfort and tightness for multiple months.  Stool today is heme negative, and rectal exam shows no significant external hemorrhoids and no palpable internal hemorrhoids. Etiology of his bleeding is not entirely clear though I suspect this was an anorectal source i.e. possibly internal hemorrhoids versus some mild colitis, cannot rule out self-limited diverticular low-grade bleed. I think it is less likely that he has bled from his stomach though cannot totally rule out.  Plan; repeat CBC today Patient has been off aspirin and will stay off aspirin for now Chemotherapy was held last week because of the  bleeding. Will schedule patient for EGD with Dr. Havery Moros which will be done later this week, this will help reassess his stomach, and assure no significant pathology there which may affect upcoming chemotherapy.  Procedure was discussed in detail with the patient and his wife including indications risk and benefits and he is agreeable to proceed. Would like to avoid colonoscopy, due to advanced age etc.  However if EGD is unrevealing and patient has further bleeding and management would be altered because of the bleeding then we could consider colonoscopy.  Delanna Blacketer Genia Harold PA-C 08/05/2020   Cc: Biagio Borg, MD

## 2020-08-06 ENCOUNTER — Inpatient Hospital Stay: Payer: Medicare PPO

## 2020-08-07 ENCOUNTER — Ambulatory Visit (AMBULATORY_SURGERY_CENTER): Payer: Medicare PPO | Admitting: Gastroenterology

## 2020-08-07 ENCOUNTER — Encounter: Payer: Self-pay | Admitting: Gastroenterology

## 2020-08-07 ENCOUNTER — Other Ambulatory Visit: Payer: Self-pay

## 2020-08-07 VITALS — BP 143/80 | HR 51 | Temp 97.8°F | Resp 16 | Ht 68.0 in | Wt 188.0 lb

## 2020-08-07 DIAGNOSIS — B3781 Candidal esophagitis: Secondary | ICD-10-CM

## 2020-08-07 DIAGNOSIS — C162 Malignant neoplasm of body of stomach: Secondary | ICD-10-CM

## 2020-08-07 DIAGNOSIS — D649 Anemia, unspecified: Secondary | ICD-10-CM

## 2020-08-07 DIAGNOSIS — K922 Gastrointestinal hemorrhage, unspecified: Secondary | ICD-10-CM | POA: Diagnosis not present

## 2020-08-07 DIAGNOSIS — Z85028 Personal history of other malignant neoplasm of stomach: Secondary | ICD-10-CM | POA: Diagnosis not present

## 2020-08-07 MED ORDER — FLUCONAZOLE 100 MG PO TABS: 100.0000 mg | ORAL_TABLET | Freq: Every day | ORAL | 0 refills | Status: DC

## 2020-08-07 MED ORDER — SODIUM CHLORIDE 0.9 % IV SOLN: 500.0000 mL | Freq: Once | INTRAVENOUS | Status: DC

## 2020-08-07 NOTE — Op Note (Signed)
[facility] [facility] Patient Name: Jonathan Lewis Procedure Date: 08/07/2020 10:20 AM MRN: [id number] Endoscopist: Jonathan Lewis , MD Age: 80 Referring MD:  Date of Birth: [birth year] Gender: Male Account #: [id number] Procedure:                Upper GI endoscopy Indications:              Follow-up of malignant adenocarcinoma of the                            stomach - known metastatic disease on recent PET                            Scan, anemia, prior rectal bleeding. Colonoscopy                            06/2015 without high risk pathology Medicines:                Monitored Anesthesia Care Procedure:                Pre-Anesthesia Assessment:                           - Prior to the procedure, a History and Physical                            was performed, and patient medications and                            allergies were reviewed. The patient's tolerance of                            previous anesthesia was also reviewed. The risks                            and benefits of the procedure and the sedation                            options and risks were discussed with the patient.                            All questions were answered, and informed consent                            was obtained. Prior Anticoagulants: The patient has                            taken no previous anticoagulant or antiplatelet                            agents. ASA Grade Assessment: III - A patient with                            severe systemic disease. After reviewing the risks  and benefits, the patient was deemed in                            satisfactory condition to undergo the procedure.                           After obtaining informed consent, the endoscope was                            passed under direct vision. Throughout the                            procedure, the patient's blood pressure, pulse, and                            oxygen saturations  were monitored continuously. The                            Endoscope was introduced through the mouth, and                            advanced to the jejunum. The upper GI endoscopy was                            accomplished without difficulty. The patient                            tolerated the procedure well. Scope In: Scope Out: Findings:                 Esophagogastric landmarks were identified: the                            Z-line was found at 38 cm, the gastroesophageal                            junction was found at 38 cm and the upper extent of                            the gastric folds was found at 38 cm from the                            incisors.                           Diffuse, white plaques were found in the middle                            third of the esophagus and in the lower third of                            the esophagus grossly consistent with candidiasis.  The exam of the esophagus was otherwise normal.                           Evidence of a patent Billroth II gastrojejunostomy                            was found. The gastrojejunal anastomosis was                            characterized by diffusely friable mucosa (contact                            hemorrhage with water jet) and slight nodularity,                            suspect benign granulation tissue. The efferent                            limb was examined. The afferent limb was examined.                            Biopsies were taken with a cold forceps for                            histology.                           The exam of the stomach was otherwise normal.                           The examined small bowel limbs were intubated and                            normal. One of the limbs was extremely angulated to                            intubated from what appeared to be scar tissue but                            no overt pathology appreciated. Complications:             No immediate complications. Estimated blood loss:                            Minimal. Estimated Blood Loss:     Estimated blood loss was minimal. Impression:               - Esophagogastric landmarks identified.                           - Esophageal plaques were found, consistent with                            candidiasis.                           -  Patent Billroth II gastrojejunostomy was found,                            characterized by friable mucosa with slight                            nodularity - not overtly malignant and suspect                            benign changes but biopsied.                           - Normal stomach otherwise                           - Normal small bowel limbs, one quite angulated but                            evaluated and nothing else concerning.                           No overt cause for prior bleeding symptoms on this                            exam, no obvious local recurrence of disease. Recommendation:           - Patient has a contact number available for                            emergencies. The signs and symptoms of potential                            delayed complications were discussed with the                            patient. Return to normal activities tomorrow.                            Written discharge instructions were provided to the                            patient.                           - Resume previous diet.                           - Continue present medications.                           - If no contraindications, start fluconazole 465m                            x 1 dose then 208m/ day for another 20 days                           -  Will discuss possible colonoscopy with the                            patient, prior symptoms of bleeding have since                            resolved Jonathan Lipps P. Jasiyah Paulding, MD 08/07/2020 10:54:12 AM This report has been signed electronically.

## 2020-08-07 NOTE — Progress Notes (Addendum)
SH - VS  Pt reported he has both front top teeth chipped and missing some teeth on right lower gum.  Maw

## 2020-08-07 NOTE — Progress Notes (Signed)
A and O x3. Report to RN. Tolerated MAC anesthesia well.Teeth unchanged after procedure.

## 2020-08-07 NOTE — Progress Notes (Signed)
Called to room to assist during endoscopic procedure.  Patient ID and intended procedure confirmed with present staff. Received instructions for my participation in the procedure from the performing physician.  

## 2020-08-07 NOTE — Patient Instructions (Signed)
Thank you for allowing [place] to care for you today!  Await final biopsy results, approximately 2 weeks.  You may resume your previous diet and medications today.  You may return to your normal daily activities tomorrow.  [facility] You will take this medicine for 3 weeks to clear up a esophagus fungal infection.     YOU HAD AN ENDOSCOPIC PROCEDURE TODAY AT [facility]:   Refer to the procedure report that was given to you for any specific questions about what was found during the examination.  If the procedure report does not answer your questions, please call your gastroenterologist to clarify.  If you requested that your care partner not be given the details of your procedure findings, then the procedure report has been included in a sealed envelope for you to review at your convenience later.  YOU SHOULD EXPECT: Some feelings of bloating in the abdomen. Passage of more gas than usual.  Walking can help get rid of the air that was put into your GI tract during the procedure and reduce the bloating. If you had a lower endoscopy (such as a colonoscopy or flexible sigmoidoscopy) you may notice spotting of blood in your stool or on the toilet paper. If you underwent a bowel prep for your procedure, you may not have a normal bowel movement for a few days.  Please Note:  You might notice some irritation and congestion in your nose or some drainage.  This is from the oxygen used during your procedure.  There is no need for concern and it should clear up in a day or so.  SYMPTOMS TO REPORT IMMEDIATELY:     Following upper endoscopy (EGD)  Vomiting of blood or coffee ground material  New chest pain or pain under the shoulder blades  Painful or persistently difficult swallowing  New shortness of breath  Fever of 100F or higher  Black, tarry-looking stools  For urgent or emergent issues, a gastroenterologist can be reached at any hour by calling  [phone]. Do not use MyChart messaging for urgent concerns.    DIET:  We do recommend a small meal at first, but then you may proceed to your regular diet.  Drink plenty of fluids but you should avoid alcoholic beverages for 24 hours.  ACTIVITY:  You should plan to take it easy for the rest of today and you should NOT DRIVE or use heavy machinery until tomorrow (because of the sedation medicines used during the test).    FOLLOW UP: Our staff will call the number listed on your records 48-72 hours following your procedure to check on you and address any questions or concerns that you may have regarding the information given to you following your procedure. If we do not reach you, we will leave a message.  We will attempt to reach you two times.  During this call, we will ask if you have developed any symptoms of COVID 19. If you develop any symptoms (ie: fever, flu-like symptoms, shortness of breath, cough etc.) before then, please call [phone].  If you test positive for Covid 19 in the 2 weeks post procedure, please call and report this information to [place].    If any biopsies were taken you will be contacted by phone or by letter within the next 1-3 weeks.  Please call us at [phone] if you have not heard about the biopsies in 3 weeks.    SIGNATURES/CONFIDENTIALITY: You and/or your care  partner have signed paperwork which will be entered into your electronic medical record.  These signatures attest to the fact that that the information above on your After Visit Summary has been reviewed and is understood.  Full responsibility of the confidentiality of this discharge information lies with you and/or your care-partner.

## 2020-08-10 ENCOUNTER — Other Ambulatory Visit: Payer: Self-pay | Admitting: Internal Medicine

## 2020-08-11 ENCOUNTER — Telehealth: Payer: Self-pay | Admitting: *Deleted

## 2020-08-11 NOTE — Telephone Encounter (Signed)
  Follow up Call-  Call back number 08/07/2020  Post procedure Call Back phone  # [phone] hm  Permission to leave phone message Yes  Some recent data might be hidden     Patient questions:  Do you have a fever, pain , or abdominal swelling? No. Pain Score  0 *  Have you tolerated food without any problems? Yes.    Have you been able to return to your normal activities? Yes.    Do you have any questions about your discharge instructions: Diet   No. Medications  No. Follow up visit  No.  Do you have questions or concerns about your Care? No.  Actions: * If pain score is 4 or above: No action needed, pain <4

## 2020-08-14 ENCOUNTER — Inpatient Hospital Stay (HOSPITAL_BASED_OUTPATIENT_CLINIC_OR_DEPARTMENT_OTHER): Payer: Medicare PPO | Admitting: Hematology & Oncology

## 2020-08-14 ENCOUNTER — Inpatient Hospital Stay: Payer: Medicare PPO

## 2020-08-14 ENCOUNTER — Encounter: Payer: Self-pay | Admitting: Hematology & Oncology

## 2020-08-14 ENCOUNTER — Other Ambulatory Visit: Payer: Self-pay

## 2020-08-14 ENCOUNTER — Inpatient Hospital Stay: Payer: Medicare PPO | Attending: Hematology & Oncology

## 2020-08-14 VITALS — BP 157/93 | HR 63 | Temp 98.1°F | Resp 20 | Wt 189.0 lb

## 2020-08-14 DIAGNOSIS — Z5111 Encounter for antineoplastic chemotherapy: Secondary | ICD-10-CM | POA: Insufficient documentation

## 2020-08-14 DIAGNOSIS — C162 Malignant neoplasm of body of stomach: Secondary | ICD-10-CM

## 2020-08-14 DIAGNOSIS — Z79899 Other long term (current) drug therapy: Secondary | ICD-10-CM | POA: Diagnosis not present

## 2020-08-14 DIAGNOSIS — D508 Other iron deficiency anemias: Secondary | ICD-10-CM | POA: Insufficient documentation

## 2020-08-14 DIAGNOSIS — C169 Malignant neoplasm of stomach, unspecified: Secondary | ICD-10-CM | POA: Insufficient documentation

## 2020-08-14 DIAGNOSIS — C160 Malignant neoplasm of cardia: Secondary | ICD-10-CM

## 2020-08-14 DIAGNOSIS — C163 Malignant neoplasm of pyloric antrum: Secondary | ICD-10-CM

## 2020-08-14 LAB — RETICULOCYTES
Immature Retic Fract: 15.7 % (ref 2.3–15.9)
RBC.: 3.77 MIL/uL — ABNORMAL LOW (ref 4.22–5.81)
Retic Count, Absolute: 63.3 10*3/uL (ref 19.0–186.0)
Retic Ct Pct: 1.7 % (ref 0.4–3.1)

## 2020-08-14 LAB — CBC WITH DIFFERENTIAL (CANCER CENTER ONLY)
Abs Immature Granulocytes: 0.03 10*3/uL (ref 0.00–0.07)
Basophils Absolute: 0 10*3/uL (ref 0.0–0.1)
Basophils Relative: 1 %
Eosinophils Absolute: 0 10*3/uL (ref 0.0–0.5)
Eosinophils Relative: 1 %
HCT: 33 % — ABNORMAL LOW (ref 39.0–52.0)
Hemoglobin: 10.7 g/dL — ABNORMAL LOW (ref 13.0–17.0)
Immature Granulocytes: 1 %
Lymphocytes Relative: 46 %
Lymphs Abs: 1.9 10*3/uL (ref 0.7–4.0)
MCH: 28.4 pg (ref 26.0–34.0)
MCHC: 32.4 g/dL (ref 30.0–36.0)
MCV: 87.5 fL (ref 80.0–100.0)
Monocytes Absolute: 0.4 10*3/uL (ref 0.1–1.0)
Monocytes Relative: 11 %
Neutro Abs: 1.6 10*3/uL — ABNORMAL LOW (ref 1.7–7.7)
Neutrophils Relative %: 40 %
Platelet Count: 184 10*3/uL (ref 150–400)
RBC: 3.77 MIL/uL — ABNORMAL LOW (ref 4.22–5.81)
RDW: 13.3 % (ref 11.5–15.5)
WBC Count: 4 10*3/uL (ref 4.0–10.5)
nRBC: 0 % (ref 0.0–0.2)

## 2020-08-14 LAB — CMP (CANCER CENTER ONLY)
ALT: 64 U/L — ABNORMAL HIGH (ref 0–44)
AST: 75 U/L — ABNORMAL HIGH (ref 15–41)
Albumin: 3.7 g/dL (ref 3.5–5.0)
Alkaline Phosphatase: 183 U/L — ABNORMAL HIGH (ref 38–126)
Anion gap: 6 (ref 5–15)
BUN: 15 mg/dL (ref 8–23)
CO2: 26 mmol/L (ref 22–32)
Calcium: 9.3 mg/dL (ref 8.9–10.3)
Chloride: 106 mmol/L (ref 98–111)
Creatinine: 0.95 mg/dL (ref 0.61–1.24)
GFR, Estimated: >60 mL/min (ref >60)
Glucose, Bld: 161 mg/dL — ABNORMAL HIGH (ref 70–99)
Potassium: 3.9 mmol/L (ref 3.5–5.1)
Sodium: 138 mmol/L (ref 135–145)
Total Bilirubin: 0.4 mg/dL (ref 0.3–1.2)
Total Protein: 6 g/dL — ABNORMAL LOW (ref 6.5–8.1)

## 2020-08-14 LAB — IRON AND TIBC
Iron: 63 ug/dL (ref 42–163)
Saturation Ratios: 18 % — ABNORMAL LOW (ref 20–55)
TIBC: 358 ug/dL (ref 202–409)
UIBC: 294 ug/dL (ref 117–376)

## 2020-08-14 LAB — TOTAL PROTEIN, URINE DIPSTICK: Protein, ur: NEGATIVE mg/dL

## 2020-08-14 LAB — CEA (IN HOUSE-CHCC): CEA (CHCC-In House): 1.15 ng/mL (ref 0.00–5.00)

## 2020-08-14 LAB — LACTATE DEHYDROGENASE: LDH: 169 U/L (ref 98–192)

## 2020-08-14 LAB — TSH: TSH: 4.106 u[IU]/mL (ref 0.320–4.118)

## 2020-08-14 LAB — FERRITIN: Ferritin: 43 ng/mL (ref 24–336)

## 2020-08-14 MED ORDER — SODIUM CHLORIDE 0.9 % IV SOLN
Freq: Once | INTRAVENOUS | Status: AC
  Filled 2020-08-14: qty 250

## 2020-08-14 MED ORDER — DIPHENHYDRAMINE HCL 50 MG/ML IJ SOLN
50.0000 mg | Freq: Once | INTRAMUSCULAR | Status: AC
  Administered 2020-08-14: 50 mg via INTRAVENOUS

## 2020-08-14 MED ORDER — FAMOTIDINE 20 MG IN NS 100 ML IVPB
20.0000 mg | Freq: Once | INTRAVENOUS | Status: AC
  Administered 2020-08-14: 20 mg via INTRAVENOUS
  Filled 2020-08-14: qty 100

## 2020-08-14 MED ORDER — PACLITAXEL CHEMO INJECTION 300 MG/50ML
80.0000 mg/m2 | Freq: Once | INTRAVENOUS | Status: AC
  Administered 2020-08-14: 162 mg via INTRAVENOUS
  Filled 2020-08-14: qty 27

## 2020-08-14 MED ORDER — HEPARIN SOD (PORK) LOCK FLUSH 100 UNIT/ML IV SOLN
500.0000 [IU] | Freq: Once | INTRAVENOUS | Status: AC | PRN
  Administered 2020-08-14: 500 [IU]
  Filled 2020-08-14: qty 5

## 2020-08-14 MED ORDER — FAMOTIDINE IN NACL 20-0.9 MG/50ML-% IV SOLN: 20.0000 mg | Freq: Once | INTRAVENOUS | Status: DC

## 2020-08-14 MED ORDER — SODIUM CHLORIDE 0.9% FLUSH
10.0000 mL | INTRAVENOUS | Status: DC | PRN
  Administered 2020-08-14: 10 mL
  Filled 2020-08-14: qty 10

## 2020-08-14 MED ORDER — DIPHENHYDRAMINE HCL 50 MG/ML IJ SOLN
INTRAMUSCULAR | Status: AC
  Filled 2020-08-14: qty 1

## 2020-08-14 MED ORDER — DEXAMETHASONE SODIUM PHOSPHATE 100 MG/10ML IJ SOLN
20.0000 mg | Freq: Once | INTRAMUSCULAR | Status: AC
  Administered 2020-08-14: 20 mg via INTRAVENOUS
  Filled 2020-08-14: qty 20

## 2020-08-14 NOTE — Patient Instructions (Signed)

## 2020-08-14 NOTE — Progress Notes (Signed)
Hematology and Oncology Follow Up Visit  Jonathan Lewis [id number] May 07, 1940 80 y.o. 08/14/2020   Principle Diagnosis:  Stage IIIA (T3N2M0) adenocarcinoma of the stomach -- S/p distal gastrectomy on 03/19/2019 -- HER2-/PD-L1+ -- metastatic  Current Therapy: Taxol/Cyramza -- start cycle #1 on 07/20/2020 - Cyramza d/c due to GU bleed.  Adjuvant FOLFOX -started on 05/13/2019, s/p cycle#8 -completed on Dec 11, 2019   Interim History:  Jonathan Lewis is here today for treatment.  Last and that we saw him, he was having some GI blood loss.  He did undergo endoscopy.  He had an upper endoscopy.  This is showed some friability at the anastomosis when he had his partial gastrectomy.  There is no obvious bleeding although when the anastomosis site was irritated, there was some blood loss.  Because this, I think were going to have to hold on the Cyramza.  I just worry that this is going to cause further weakening at this anastomotic site and he could "open up."  And this would cause a lot more problems for him.  He feels okay.  He is having no abdominal pain.  He is having no issues with nausea or vomiting.  He is having no diarrhea.  There is no fever.  Currently, I would have to say that his performance status is ECOG 1.  Medications:  Allergies as of 08/14/2020   No Known Allergies     Medication List       Accurate as of Aug 14, 2020  9:50 AM. If you have any questions, ask your nurse or doctor.        atorvastatin 20 MG tablet Commonly known as: Lipitor Take 1 tablet (20 mg total) by mouth daily.   fluconazole 100 MG tablet Commonly known as: Diflucan Take 1 tablet (100 mg total) by mouth daily.   latanoprost 0.005 % ophthalmic solution Commonly known as: XALATAN Place 1 drop into both eyes at bedtime.   lidocaine-prilocaine cream Commonly known as: EMLA Apply 1 application topically as needed. Place on the port one hour before appointment.   meclizine 12.5 MG  tablet Commonly known as: ANTIVERT TAKE 1 TABLET BY MOUTH 3 TIMES DAILY AS NEEDED FOR DIZZINESS.   methocarbamol 500 MG tablet Commonly known as: ROBAXIN Take 1 tablet (500 mg total) by mouth every 6 (six) hours as needed for muscle spasms.   multivitamin with minerals Tabs tablet Take 1 tablet by mouth daily.   ondansetron 8 MG tablet Commonly known as: Zofran Take 1 tablet (8 mg total) by mouth 2 (two) times daily as needed (Nausea or vomiting).   Systane Ultra 0.4-0.3 % Soln Generic drug: Polyethyl Glycol-Propyl Glycol Apply 1 drop to eye 3 (three) times daily as needed (dry eyes).   traMADol 50 MG tablet Commonly known as: ULTRAM Take 2 tablets (100 mg total) by mouth at bedtime as needed for moderate pain or severe pain.       Allergies: No Known Allergies  Past Medical History, Surgical history, Social history, and Family History were reviewed and updated.  Review of Systems: Review of Systems  Constitutional: Negative.   HENT: Negative.   Eyes: Negative.   Respiratory: Negative.   Cardiovascular: Negative.   Gastrointestinal: Positive for abdominal pain.  Genitourinary: Negative.   Musculoskeletal: Negative.   Skin: Negative.   Neurological: Positive for tingling.  Endo/Heme/Allergies: Negative.   Psychiatric/Behavioral: Negative.      Physical Exam:  weight is 189 lb 0.6 oz (85.7 kg). His oral temperature  is 98.1 F (36.7 C). His blood pressure is 157/93 (abnormal) and his pulse is 63. His respiration is 20 and oxygen saturation is 95%.   Wt Readings from Last 3 Encounters:  08/14/20 189 lb 0.6 oz (85.7 kg)  08/07/20 188 lb (85.3 kg)  08/05/20 188 lb 2 oz (85.3 kg)    Physical Activity: Not on file    Physical Exam Vitals reviewed.  HENT:     Head: Normocephalic and atraumatic.  Eyes:     Pupils: Pupils are equal, round, and reactive to light.  Cardiovascular:     Rate and Rhythm: Normal rate and regular rhythm.     Heart sounds: Normal  heart sounds.  Pulmonary:     Effort: Pulmonary effort is normal.     Breath sounds: Normal breath sounds.  Abdominal:     General: Bowel sounds are normal.     Palpations: Abdomen is soft.     Comments: Abdominal exam is soft.  He has well-healed laparotomy scars.  There is no tenderness to palpation.  He has no guarding or rebound tenderness.  There is no fluid wave.  There is no palpable liver or spleen tip.  Genitourinary:    Comments: On his rectal exam, he does have an external hemorrhoid.  This is not thrombosed or bleeding.  His rectal vault is smooth.  He has very little stool.  It is heme positive. Musculoskeletal:        General: No tenderness or deformity. Normal range of motion.     Cervical back: Normal range of motion.  Lymphadenopathy:     Cervical: No cervical adenopathy.  Skin:    General: Skin is warm and dry.     Findings: No erythema or rash.  Neurological:     Mental Status: He is alert and oriented to person, place, and time.  Psychiatric:        Behavior: Behavior normal.        Thought Content: Thought content normal.        Judgment: Judgment normal.     Lab Results  Component Value Date   WBC 4.0 08/14/2020   HGB 10.7 (L) 08/14/2020   HCT 33.0 (L) 08/14/2020   MCV 87.5 08/14/2020   PLT 184 08/14/2020   Lab Results  Component Value Date   FERRITIN 56 07/09/2020   IRON 114 07/09/2020   TIBC 346 07/09/2020   UIBC 232 07/09/2020   IRONPCTSAT 33 07/09/2020   Lab Results  Component Value Date   RETICCTPCT 1.7 08/14/2020   RBC 3.77 (L) 08/14/2020   RBC 3.77 (L) 08/14/2020   No results found for: KPAFRELGTCHN, LAMBDASER, KAPLAMBRATIO No results found for: IGGSERUM, IGA, IGMSERUM No results found for: Ronnald Ramp, A1GS, A2GS, Tillman Sers, SPEI   Chemistry      Component Value Date/Time   NA 139 07/30/2020 0840   K 4.3 07/30/2020 0840   CL 105 07/30/2020 0840   CO2 27 07/30/2020 0840   BUN 15 07/30/2020 0840    CREATININE 0.87 07/30/2020 0840   CREATININE 1.06 07/16/2012 1038      Component Value Date/Time   CALCIUM 9.1 07/30/2020 0840   ALKPHOS 151 (H) 07/30/2020 0840   AST 54 (H) 07/30/2020 0840   ALT 52 (H) 07/30/2020 0840   BILITOT 0.5 07/30/2020 0840       Impression and Plan: Jonathan Lewis is a very pleasant 80 yo African American gentleman with stage IIIA (T3N2M0) adenocarcinoma of the stomach, HER2-/PD-L1+.  He had a distal gastrectomy on 12/15/2020andwas treated with adjuvant  FOLFOX.   He now has metastatic disease.  He is on treatment with Taxol/Cyramza.  At this point, going to have to hold the [place].  I do still feel confident using this given the findings of the upper endoscopy.  Hopefully, just the weekly Taxol will do the job for [place].  We will proceed with Taxol today.  I noted that his LFTs are slightly elevated.  We will have to watch this closely.  We will have him come back next week for day 15 of his first cycle of treatment.  We will make sure that his LFTs are checked.  I will see him back myself on June 2 when he starts his second cycle of Taxol.   Volanda Napoleon, MD 5/13/20229:50 AM

## 2020-08-14 NOTE — Patient Instructions (Addendum)
[facility]  Discharge Instructions: Thank you for choosing [facility] to provide your oncology and hematology care.   If you have a lab appointment with the Cancer Center, please go directly to the Cancer Center and check in at the registration area.  Wear comfortable clothing and clothing appropriate for easy access to any Portacath or PICC line.   We strive to give you quality time with your provider. You may need to reschedule your appointment if you arrive late (15 or more minutes).  Arriving late affects you and other patients whose appointments are after yours.  Also, if you miss three or more appointments without notifying the office, you may be dismissed from the clinic at the provider's discretion.      For prescription refill requests, have your pharmacy contact our office and allow 72 hours for refills to be completed.    Today you received the following chemotherapy and/or immunotherapy agents Taxol.    To help prevent nausea and vomiting after your treatment, we encourage you to take your nausea medication as directed.  BELOW ARE SYMPTOMS THAT SHOULD BE REPORTED IMMEDIATELY: . *FEVER GREATER THAN 100.4 F (38 C) OR HIGHER . *CHILLS OR SWEATING . *NAUSEA AND VOMITING THAT IS NOT CONTROLLED WITH YOUR NAUSEA MEDICATION . *UNUSUAL SHORTNESS OF BREATH . *UNUSUAL BRUISING OR BLEEDING . *URINARY PROBLEMS (pain or burning when urinating, or frequent urination) . *BOWEL PROBLEMS (unusual diarrhea, constipation, pain near the anus) . TENDERNESS IN MOUTH AND THROAT WITH OR WITHOUT PRESENCE OF ULCERS (sore throat, sores in mouth, or a toothache) . UNUSUAL RASH, SWELLING OR PAIN  . UNUSUAL VAGINAL DISCHARGE OR ITCHING   Items with * indicate a potential emergency and should be followed up as soon as possible or go to the Emergency Department if any problems should occur.  Please show the CHEMOTHERAPY ALERT CARD or IMMUNOTHERAPY ALERT CARD at  check-in to the Emergency Department and triage nurse. Should you have questions after your visit or need to cancel or reschedule your appointment, please contact [facility]  [phone] and follow the prompts.  Office hours are 8:00 a.m. to 4:30 p.m. Monday - Friday. Please note that voicemails left after 4:00 p.m. may not be returned until the following business day.  We are closed weekends and major holidays. You have access to a nurse at all times for urgent questions. Please call the main number to the clinic [phone] and follow the prompts.  For any non-urgent questions, you may also contact your provider using MyChart. We now offer e-Visits for anyone 18 and older to request care online for non-urgent symptoms. For details visit [email or web address].   Also download the MyChart app! Go to the app store, search "MyChart", open the app, select [facility], and log in with your MyChart username and password.  Due to Covid, a mask is required upon entering the hospital/clinic. If you do not have a mask, one will be given to you upon arrival. For doctor visits, patients may have 1 support person aged 18 or older with them. For treatment visits, patients cannot have anyone with them due to current Covid guidelines and our immunocompromised population.  

## 2020-08-15 LAB — T4: T4, Total: 8.7 ug/dL (ref 4.5–12.0)

## 2020-08-17 ENCOUNTER — Inpatient Hospital Stay: Payer: Medicare PPO

## 2020-08-17 ENCOUNTER — Inpatient Hospital Stay: Payer: Medicare PPO | Admitting: Hematology & Oncology

## 2020-08-17 ENCOUNTER — Ambulatory Visit: Payer: Medicare PPO | Admitting: Hematology & Oncology

## 2020-08-17 ENCOUNTER — Other Ambulatory Visit: Payer: Medicare PPO

## 2020-08-18 ENCOUNTER — Telehealth: Payer: Self-pay | Admitting: *Deleted

## 2020-08-18 ENCOUNTER — Other Ambulatory Visit: Payer: Medicare PPO

## 2020-08-18 NOTE — Telephone Encounter (Signed)
Per 08/14/20 los called and gave upcoming appointments

## 2020-08-20 ENCOUNTER — Telehealth: Payer: Self-pay | Admitting: *Deleted

## 2020-08-20 ENCOUNTER — Other Ambulatory Visit: Payer: Medicare PPO

## 2020-08-20 ENCOUNTER — Inpatient Hospital Stay: Payer: Medicare PPO

## 2020-08-20 ENCOUNTER — Other Ambulatory Visit: Payer: Self-pay

## 2020-08-20 ENCOUNTER — Ambulatory Visit: Payer: Medicare PPO

## 2020-08-20 ENCOUNTER — Other Ambulatory Visit: Payer: Self-pay | Admitting: Family

## 2020-08-20 ENCOUNTER — Other Ambulatory Visit: Payer: Self-pay | Admitting: *Deleted

## 2020-08-20 VITALS — BP 131/82 | HR 70 | Temp 99.2°F

## 2020-08-20 DIAGNOSIS — D509 Iron deficiency anemia, unspecified: Secondary | ICD-10-CM

## 2020-08-20 DIAGNOSIS — C162 Malignant neoplasm of body of stomach: Secondary | ICD-10-CM

## 2020-08-20 DIAGNOSIS — C169 Malignant neoplasm of stomach, unspecified: Secondary | ICD-10-CM | POA: Diagnosis not present

## 2020-08-20 DIAGNOSIS — E440 Moderate protein-calorie malnutrition: Secondary | ICD-10-CM

## 2020-08-20 DIAGNOSIS — K921 Melena: Secondary | ICD-10-CM

## 2020-08-20 DIAGNOSIS — Z79899 Other long term (current) drug therapy: Secondary | ICD-10-CM | POA: Diagnosis not present

## 2020-08-20 DIAGNOSIS — Z5111 Encounter for antineoplastic chemotherapy: Secondary | ICD-10-CM | POA: Diagnosis not present

## 2020-08-20 DIAGNOSIS — D508 Other iron deficiency anemias: Secondary | ICD-10-CM | POA: Diagnosis not present

## 2020-08-20 LAB — CMP (CANCER CENTER ONLY)
ALT: 43 U/L (ref 0–44)
AST: 37 U/L (ref 15–41)
Albumin: 3.6 g/dL (ref 3.5–5.0)
Alkaline Phosphatase: 139 U/L — ABNORMAL HIGH (ref 38–126)
Anion gap: 3 — ABNORMAL LOW (ref 5–15)
BUN: 12 mg/dL (ref 8–23)
CO2: 29 mmol/L (ref 22–32)
Calcium: 9.2 mg/dL (ref 8.9–10.3)
Chloride: 108 mmol/L (ref 98–111)
Creatinine: 0.76 mg/dL (ref 0.61–1.24)
GFR, Estimated: >60 mL/min (ref >60)
Glucose, Bld: 87 mg/dL (ref 70–99)
Potassium: 4.1 mmol/L (ref 3.5–5.1)
Sodium: 140 mmol/L (ref 135–145)
Total Bilirubin: 0.6 mg/dL (ref 0.3–1.2)
Total Protein: 6 g/dL — ABNORMAL LOW (ref 6.5–8.1)

## 2020-08-20 LAB — CBC WITH DIFFERENTIAL (CANCER CENTER ONLY)
Abs Immature Granulocytes: 0.01 10*3/uL (ref 0.00–0.07)
Basophils Absolute: 0 10*3/uL (ref 0.0–0.1)
Basophils Relative: 0 %
Eosinophils Absolute: 0 10*3/uL (ref 0.0–0.5)
Eosinophils Relative: 1 %
HCT: 30.5 % — ABNORMAL LOW (ref 39.0–52.0)
Hemoglobin: 10.1 g/dL — ABNORMAL LOW (ref 13.0–17.0)
Immature Granulocytes: 0 %
Lymphocytes Relative: 59 %
Lymphs Abs: 1.7 10*3/uL (ref 0.7–4.0)
MCH: 28.5 pg (ref 26.0–34.0)
MCHC: 33.1 g/dL (ref 30.0–36.0)
MCV: 86.2 fL (ref 80.0–100.0)
Monocytes Absolute: 0.1 10*3/uL (ref 0.1–1.0)
Monocytes Relative: 3 %
Neutro Abs: 1.1 10*3/uL — ABNORMAL LOW (ref 1.7–7.7)
Neutrophils Relative %: 37 %
Platelet Count: 168 10*3/uL (ref 150–400)
RBC: 3.54 MIL/uL — ABNORMAL LOW (ref 4.22–5.81)
RDW: 12.9 % (ref 11.5–15.5)
WBC Count: 3 10*3/uL — ABNORMAL LOW (ref 4.0–10.5)
nRBC: 0 % (ref 0.0–0.2)

## 2020-08-20 LAB — SAMPLE TO BLOOD BANK

## 2020-08-20 LAB — TOTAL PROTEIN, URINE DIPSTICK: Protein, ur: NEGATIVE mg/dL

## 2020-08-20 MED ORDER — SODIUM CHLORIDE 0.9 % IV SOLN
Freq: Once | INTRAVENOUS | Status: AC
Start: 2020-08-20 — End: 2020-08-20
  Filled 2020-08-20: qty 250

## 2020-08-20 MED ORDER — SODIUM CHLORIDE 0.9 % IV SOLN
200.0000 mg | Freq: Once | INTRAVENOUS | Status: AC
  Administered 2020-08-20: 200 mg via INTRAVENOUS
  Filled 2020-08-20: qty 200

## 2020-08-20 NOTE — Patient Instructions (Signed)

## 2020-08-20 NOTE — Telephone Encounter (Signed)
Returned patient's phone call regarding rectal bleeding this morning. I instructed him to come in for labs today. Dr. Marin Olp is out of the office the rest of this week, so, per Judson Roch [place],NP, we will HOLD treatment tomorrow. Check CBC and see if he needs blood. Patient verbalized understanding.

## 2020-08-20 NOTE — Patient Instructions (Signed)
Implanted Port Insertion, Care After This sheet gives you information about how to care for yourself after your procedure. Your health care provider may also give you more specific instructions. If you have problems or questions, contact your health care provider. What can I expect after the procedure? After the procedure, it is common to have:  Discomfort at the port insertion site.  Bruising on the skin over the port. This should improve over 3-4 days. Follow these instructions at home: Port care  After your port is placed, you will get a manufacturer's information card. The card has information about your port. Keep this card with you at all times.  Take care of the port as told by your health care provider. Ask your health care provider if you or a family member can get training for taking care of the port at home. A home health care nurse may also take care of the port.  Make sure to remember what type of port you have. Incision care  Follow instructions from your health care provider about how to take care of your port insertion site. Make sure you: ? Wash your hands with soap and water before and after you change your bandage (dressing). If soap and water are not available, use hand sanitizer. ? Change your dressing as told by your health care provider. ? Leave stitches (sutures), skin glue, or adhesive strips in place. These skin closures may need to stay in place for 2 weeks or longer. If adhesive strip edges start to loosen and curl up, you may trim the loose edges. Do not remove adhesive strips completely unless your health care provider tells you to do that.  Check your port insertion site every day for signs of infection. Check for: ? Redness, swelling, or pain. ? Fluid or blood. ? Warmth. ? Pus or a bad smell.      Activity  Return to your normal activities as told by your health care provider. Ask your health care provider what activities are safe for you.  Do not  lift anything that is heavier than 10 lb (4.5 kg), or the limit that you are told, until your health care provider says that it is safe. General instructions  Take over-the-counter and prescription medicines only as told by your health care provider.  Do not take baths, swim, or use a hot tub until your health care provider approves. Ask your health care provider if you may take showers. You may only be allowed to take sponge baths.  Do not drive for 24 hours if you were given a sedative during your procedure.  Wear a medical alert bracelet in case of an emergency. This will tell any health care providers that you have a port.  Keep all follow-up visits as told by your health care provider. This is important. Contact a health care provider if:  You cannot flush your port with saline as directed, or you cannot draw blood from the port.  You have a fever or chills.  You have redness, swelling, or pain around your port insertion site.  You have fluid or blood coming from your port insertion site.  Your port insertion site feels warm to the touch.  You have pus or a bad smell coming from the port insertion site. Get help right away if:  You have chest pain or shortness of breath.  You have bleeding from your port that you cannot control. Summary  Take care of the port as told by your   health care provider. Keep the manufacturer's information card with you at all times.  Change your dressing as told by your health care provider.  Contact a health care provider if you have a fever or chills or if you have redness, swelling, or pain around your port insertion site.  Keep all follow-up visits as told by your health care provider. This information is not intended to replace advice given to you by your health care provider. Make sure you discuss any questions you have with your health care provider. Document Revised: 10/17/2017 Document Reviewed: 10/17/2017 Elsevier Patient Education   2021 [employer]  

## 2020-08-21 ENCOUNTER — Other Ambulatory Visit: Payer: Medicare PPO

## 2020-08-21 ENCOUNTER — Ambulatory Visit: Payer: Medicare PPO

## 2020-08-21 LAB — T4: T4, Total: 7.6 ug/dL (ref 4.5–12.0)

## 2020-08-21 LAB — TSH: TSH: 2.057 u[IU]/mL (ref 0.320–4.118)

## 2020-08-24 ENCOUNTER — Inpatient Hospital Stay: Payer: Medicare PPO

## 2020-08-24 ENCOUNTER — Other Ambulatory Visit: Payer: Self-pay

## 2020-08-24 ENCOUNTER — Other Ambulatory Visit: Payer: Self-pay | Admitting: *Deleted

## 2020-08-24 VITALS — BP 144/76 | HR 57 | Temp 98.5°F | Resp 17

## 2020-08-24 DIAGNOSIS — E440 Moderate protein-calorie malnutrition: Secondary | ICD-10-CM

## 2020-08-24 DIAGNOSIS — D509 Iron deficiency anemia, unspecified: Secondary | ICD-10-CM

## 2020-08-24 DIAGNOSIS — Z79899 Other long term (current) drug therapy: Secondary | ICD-10-CM | POA: Diagnosis not present

## 2020-08-24 DIAGNOSIS — K921 Melena: Secondary | ICD-10-CM

## 2020-08-24 DIAGNOSIS — C169 Malignant neoplasm of stomach, unspecified: Secondary | ICD-10-CM | POA: Diagnosis not present

## 2020-08-24 DIAGNOSIS — D508 Other iron deficiency anemias: Secondary | ICD-10-CM | POA: Diagnosis not present

## 2020-08-24 DIAGNOSIS — C162 Malignant neoplasm of body of stomach: Secondary | ICD-10-CM

## 2020-08-24 DIAGNOSIS — Z5111 Encounter for antineoplastic chemotherapy: Secondary | ICD-10-CM | POA: Diagnosis not present

## 2020-08-24 MED ORDER — IRON SUCROSE 20 MG/ML IV SOLN
200.0000 mg | Freq: Once | INTRAVENOUS | Status: AC
Start: 2020-08-24 — End: 2020-08-24
  Administered 2020-08-24: 200 mg via INTRAVENOUS
  Filled 2020-08-24: qty 200

## 2020-08-24 MED ORDER — SODIUM CHLORIDE 0.9% FLUSH
10.0000 mL | Freq: Once | INTRAVENOUS | Status: AC
  Administered 2020-08-24: 10 mL via INTRAVENOUS
  Filled 2020-08-24: qty 10

## 2020-08-24 MED ORDER — HEPARIN SOD (PORK) LOCK FLUSH 100 UNIT/ML IV SOLN
500.0000 [IU] | Freq: Once | INTRAVENOUS | Status: AC
  Administered 2020-08-24: 500 [IU] via INTRAVENOUS
  Filled 2020-08-24: qty 5

## 2020-08-24 MED ORDER — FAMOTIDINE 20 MG IN NS 100 ML IVPB
20.0000 mg | Freq: Once | INTRAVENOUS | Status: AC
Start: 2020-08-24 — End: 2020-08-24
  Administered 2020-08-24: 20 mg via INTRAVENOUS
  Filled 2020-08-24: qty 100

## 2020-08-24 MED ORDER — LIDOCAINE-PRILOCAINE 2.5-2.5 % EX CREA: 1.0000 "application " | TOPICAL_CREAM | CUTANEOUS | 3 refills | Status: AC | PRN

## 2020-08-24 NOTE — Patient Instructions (Signed)

## 2020-08-27 ENCOUNTER — Inpatient Hospital Stay: Payer: Medicare PPO

## 2020-08-27 ENCOUNTER — Other Ambulatory Visit: Payer: Self-pay

## 2020-08-27 VITALS — BP 154/85 | HR 56 | Temp 98.0°F | Resp 18

## 2020-08-27 DIAGNOSIS — D509 Iron deficiency anemia, unspecified: Secondary | ICD-10-CM

## 2020-08-27 DIAGNOSIS — E440 Moderate protein-calorie malnutrition: Secondary | ICD-10-CM

## 2020-08-27 DIAGNOSIS — Z5111 Encounter for antineoplastic chemotherapy: Secondary | ICD-10-CM | POA: Diagnosis not present

## 2020-08-27 DIAGNOSIS — D508 Other iron deficiency anemias: Secondary | ICD-10-CM | POA: Diagnosis not present

## 2020-08-27 DIAGNOSIS — C162 Malignant neoplasm of body of stomach: Secondary | ICD-10-CM

## 2020-08-27 DIAGNOSIS — C169 Malignant neoplasm of stomach, unspecified: Secondary | ICD-10-CM | POA: Diagnosis not present

## 2020-08-27 DIAGNOSIS — K921 Melena: Secondary | ICD-10-CM

## 2020-08-27 DIAGNOSIS — Z79899 Other long term (current) drug therapy: Secondary | ICD-10-CM | POA: Diagnosis not present

## 2020-08-27 MED ORDER — FAMOTIDINE 20 MG IN NS 100 ML IVPB
20.0000 mg | Freq: Once | INTRAVENOUS | Status: AC
  Administered 2020-08-27: 20 mg via INTRAVENOUS
  Filled 2020-08-27: qty 20

## 2020-08-27 MED ORDER — SODIUM CHLORIDE 0.9 % IV SOLN
Freq: Once | INTRAVENOUS | Status: AC
Start: 2020-08-27 — End: 2020-08-27
  Filled 2020-08-27: qty 250

## 2020-08-27 MED ORDER — SODIUM CHLORIDE 0.9% FLUSH
10.0000 mL | Freq: Once | INTRAVENOUS | Status: AC
  Administered 2020-08-27: 10 mL via INTRAVENOUS
  Filled 2020-08-27: qty 10

## 2020-08-27 MED ORDER — FAMOTIDINE IN NACL 20-0.9 MG/50ML-% IV SOLN
20.0000 mg | Freq: Once | INTRAVENOUS | Status: DC
Start: 2020-08-27 — End: 2020-08-27

## 2020-08-27 MED ORDER — HEPARIN SOD (PORK) LOCK FLUSH 100 UNIT/ML IV SOLN
500.0000 [IU] | Freq: Once | INTRAVENOUS | Status: AC
  Administered 2020-08-27: 500 [IU] via INTRAVENOUS
  Filled 2020-08-27: qty 5

## 2020-08-27 MED ORDER — SODIUM CHLORIDE 0.9 % IV SOLN
200.0000 mg | Freq: Once | INTRAVENOUS | Status: AC
  Administered 2020-08-27: 200 mg via INTRAVENOUS
  Filled 2020-08-27: qty 200

## 2020-08-27 NOTE — Patient Instructions (Signed)

## 2020-08-28 ENCOUNTER — Ambulatory Visit: Payer: Medicare PPO

## 2020-09-04 ENCOUNTER — Inpatient Hospital Stay: Payer: Medicare PPO

## 2020-09-04 ENCOUNTER — Other Ambulatory Visit: Payer: Self-pay | Admitting: Hematology & Oncology

## 2020-09-04 ENCOUNTER — Encounter: Payer: Self-pay | Admitting: Hematology & Oncology

## 2020-09-04 ENCOUNTER — Inpatient Hospital Stay (HOSPITAL_BASED_OUTPATIENT_CLINIC_OR_DEPARTMENT_OTHER): Payer: Medicare PPO | Admitting: Hematology & Oncology

## 2020-09-04 ENCOUNTER — Ambulatory Visit (HOSPITAL_COMMUNITY)
Admission: RE | Admit: 2020-09-04 | Discharge: 2020-09-04 | Disposition: A | Discharge status: Home / Self Care | Payer: Medicare PPO | Source: Ambulatory Visit | Attending: Hematology & Oncology | Admitting: Hematology & Oncology

## 2020-09-04 ENCOUNTER — Other Ambulatory Visit: Payer: Self-pay

## 2020-09-04 ENCOUNTER — Inpatient Hospital Stay: Payer: Medicare PPO | Attending: Hematology & Oncology

## 2020-09-04 ENCOUNTER — Telehealth: Payer: Self-pay | Admitting: *Deleted

## 2020-09-04 VITALS — BP 137/74 | HR 65 | Temp 98.3°F | Resp 18 | Ht 68.0 in | Wt 185.8 lb

## 2020-09-04 DIAGNOSIS — Z9221 Personal history of antineoplastic chemotherapy: Secondary | ICD-10-CM | POA: Diagnosis not present

## 2020-09-04 DIAGNOSIS — C161 Malignant neoplasm of fundus of stomach: Secondary | ICD-10-CM | POA: Diagnosis not present

## 2020-09-04 DIAGNOSIS — C169 Malignant neoplasm of stomach, unspecified: Secondary | ICD-10-CM | POA: Insufficient documentation

## 2020-09-04 DIAGNOSIS — D508 Other iron deficiency anemias: Secondary | ICD-10-CM | POA: Diagnosis not present

## 2020-09-04 DIAGNOSIS — Z79899 Other long term (current) drug therapy: Secondary | ICD-10-CM | POA: Diagnosis not present

## 2020-09-04 DIAGNOSIS — Z95828 Presence of other vascular implants and grafts: Secondary | ICD-10-CM

## 2020-09-04 DIAGNOSIS — R911 Solitary pulmonary nodule: Secondary | ICD-10-CM | POA: Diagnosis not present

## 2020-09-04 DIAGNOSIS — K839 Disease of biliary tract, unspecified: Secondary | ICD-10-CM | POA: Diagnosis not present

## 2020-09-04 DIAGNOSIS — R17 Unspecified jaundice: Secondary | ICD-10-CM | POA: Diagnosis not present

## 2020-09-04 DIAGNOSIS — K869 Disease of pancreas, unspecified: Secondary | ICD-10-CM | POA: Diagnosis not present

## 2020-09-04 DIAGNOSIS — C162 Malignant neoplasm of body of stomach: Secondary | ICD-10-CM

## 2020-09-04 DIAGNOSIS — C160 Malignant neoplasm of cardia: Secondary | ICD-10-CM

## 2020-09-04 LAB — CMP (CANCER CENTER ONLY)
ALT: 245 U/L — ABNORMAL HIGH (ref 0–44)
AST: 191 U/L (ref 15–41)
Albumin: 3.7 g/dL (ref 3.5–5.0)
Alkaline Phosphatase: 400 U/L — ABNORMAL HIGH (ref 38–126)
Anion gap: 6 (ref 5–15)
BUN: 9 mg/dL (ref 8–23)
CO2: 26 mmol/L (ref 22–32)
Calcium: 9.1 mg/dL (ref 8.9–10.3)
Chloride: 109 mmol/L (ref 98–111)
Creatinine: 0.76 mg/dL (ref 0.61–1.24)
GFR, Estimated: >60 mL/min (ref >60)
Glucose, Bld: 106 mg/dL — ABNORMAL HIGH (ref 70–99)
Potassium: 3.7 mmol/L (ref 3.5–5.1)
Sodium: 141 mmol/L (ref 135–145)
Total Bilirubin: 5.6 mg/dL (ref 0.3–1.2)
Total Protein: 6.2 g/dL — ABNORMAL LOW (ref 6.5–8.1)

## 2020-09-04 LAB — CBC WITH DIFFERENTIAL (CANCER CENTER ONLY)
Abs Immature Granulocytes: 0.01 10*3/uL (ref 0.00–0.07)
Basophils Absolute: 0 10*3/uL (ref 0.0–0.1)
Basophils Relative: 1 %
Eosinophils Absolute: 0.1 10*3/uL (ref 0.0–0.5)
Eosinophils Relative: 2 %
HCT: 33.4 % — ABNORMAL LOW (ref 39.0–52.0)
Hemoglobin: 10.9 g/dL — ABNORMAL LOW (ref 13.0–17.0)
Immature Granulocytes: 0 %
Lymphocytes Relative: 39 %
Lymphs Abs: 1.4 10*3/uL (ref 0.7–4.0)
MCH: 28.5 pg (ref 26.0–34.0)
MCHC: 32.6 g/dL (ref 30.0–36.0)
MCV: 87.2 fL (ref 80.0–100.0)
Monocytes Absolute: 0.5 10*3/uL (ref 0.1–1.0)
Monocytes Relative: 13 %
Neutro Abs: 1.7 10*3/uL (ref 1.7–7.7)
Neutrophils Relative %: 45 %
Platelet Count: 201 10*3/uL (ref 150–400)
RBC: 3.83 MIL/uL — ABNORMAL LOW (ref 4.22–5.81)
RDW: 14.6 % (ref 11.5–15.5)
WBC Count: 3.7 10*3/uL — ABNORMAL LOW (ref 4.0–10.5)
nRBC: 0 % (ref 0.0–0.2)

## 2020-09-04 LAB — RETICULOCYTES
Immature Retic Fract: 11.4 % (ref 2.3–15.9)
RBC.: 3.79 MIL/uL — ABNORMAL LOW (ref 4.22–5.81)
Retic Count, Absolute: 59.5 10*3/uL (ref 19.0–186.0)
Retic Ct Pct: 1.6 % (ref 0.4–3.1)

## 2020-09-04 LAB — TOTAL PROTEIN, URINE DIPSTICK: Protein, ur: NEGATIVE mg/dL

## 2020-09-04 MED ORDER — GADOBUTROL 1 MMOL/ML IV SOLN
9.0000 mL | Freq: Once | INTRAVENOUS | Status: AC | PRN
  Administered 2020-09-04: 9 mL via INTRAVENOUS

## 2020-09-04 MED ORDER — SODIUM CHLORIDE 0.9% FLUSH
10.0000 mL | INTRAVENOUS | Status: DC | PRN
  Administered 2020-09-04: 10 mL via INTRAVENOUS
  Filled 2020-09-04: qty 10

## 2020-09-04 MED ORDER — HEPARIN SOD (PORK) LOCK FLUSH 100 UNIT/ML IV SOLN
500.0000 [IU] | Freq: Once | INTRAVENOUS | Status: AC
Start: 2020-09-04 — End: 2020-09-04
  Administered 2020-09-04: 500 [IU] via INTRAVENOUS
  Filled 2020-09-04: qty 5

## 2020-09-04 MED ORDER — SODIUM CHLORIDE 0.9% FLUSH
10.0000 mL | Freq: Once | INTRAVENOUS | Status: AC
Start: 2020-09-04 — End: 2020-09-04
  Administered 2020-09-04: 10 mL via INTRAVENOUS
  Filled 2020-09-04: qty 10

## 2020-09-04 NOTE — Telephone Encounter (Signed)
Dr. Marin Olp notified of AST-191 and Total bili-5.6. No treatment for pt today per order of Dr. Marin Olp.

## 2020-09-04 NOTE — Progress Notes (Signed)
Hematology and Oncology Follow Up Visit  Jonathan Lewis [id number] 1940/06/18 80 y.o. 09/04/2020   Principle Diagnosis:  Stage IIIA (T3N2M0) adenocarcinoma of the stomach -- S/p distal gastrectomy on 03/19/2019 -- HER2-/PD-L1+ -- metastatic  Current Therapy: Taxol/Cyramza -- start cycle #1 on 07/20/2020 - Cyramza d/c due to GI bleed.  Adjuvant FOLFOX -started on 05/13/2019, s/p cycle#8 -completed on Dec 11, 2019   Interim History:  Jonathan Lewis is here today for treatment.  Unfortunately, we have a problem now.  He is jaundiced.  His bilirubin is 5.6.  Just 2 weeks ago, his bilirubin was 0.6.  Have to worry that there is intrahepatic biliary obstruction because of metastatic cancer.  I have to set him up with an MRCP to see if we can see what is going on.  He really does not have a lot of symptoms.  He does have some fullness over the right upper quadrant.  He has had no nausea or vomiting.  He says his urine is dark.  There is no diarrhea or constipation.  He has had no bleeding.  There is no fever.  Overall, his performance status is ECOG 1.    Medications:  Allergies as of 09/04/2020   No Known Allergies     Medication List       Accurate as of September 04, 2020 11:17 AM. If you have any questions, ask your nurse or doctor.        atorvastatin 20 MG tablet Commonly known as: Lipitor Take 1 tablet (20 mg total) by mouth daily.   fluconazole 100 MG tablet Commonly known as: Diflucan Take 1 tablet (100 mg total) by mouth daily.   latanoprost 0.005 % ophthalmic solution Commonly known as: XALATAN Place 1 drop into both eyes at bedtime.   lidocaine-prilocaine cream Commonly known as: EMLA Apply 1 application topically as needed. Place on the port one hour before appointment.   meclizine 12.5 MG tablet Commonly known as: ANTIVERT TAKE 1 TABLET BY MOUTH 3 TIMES DAILY AS NEEDED FOR DIZZINESS.   methocarbamol 500 MG tablet Commonly known as:  ROBAXIN Take 1 tablet (500 mg total) by mouth every 6 (six) hours as needed for muscle spasms.   multivitamin with minerals Tabs tablet Take 1 tablet by mouth daily.   ondansetron 8 MG tablet Commonly known as: Zofran Take 1 tablet (8 mg total) by mouth 2 (two) times daily as needed (Nausea or vomiting).   Systane Ultra 0.4-0.3 % Soln Generic drug: Polyethyl Glycol-Propyl Glycol Apply 1 drop to eye 3 (three) times daily as needed (dry eyes).   traMADol 50 MG tablet Commonly known as: ULTRAM Take 2 tablets (100 mg total) by mouth at bedtime as needed for moderate pain or severe pain.       Allergies: No Known Allergies  Past Medical History, Surgical history, Social history, and Family History were reviewed and updated.  Review of Systems: Review of Systems  Constitutional: Negative.   HENT: Negative.   Eyes: Negative.   Respiratory: Negative.   Cardiovascular: Negative.   Gastrointestinal: Positive for abdominal pain.  Genitourinary: Negative.   Musculoskeletal: Negative.   Skin: Negative.   Neurological: Positive for tingling.  Endo/Heme/Allergies: Negative.   Psychiatric/Behavioral: Negative.      Physical Exam:  height is _0  (1.727 m) and weight is 84.3 kg. His oral temperature is 98.3 F (36.8 C). His blood pressure is 137/74 and his pulse is 65. His respiration is 18 and oxygen saturation is 100%.  Wt Readings from Last 3 Encounters:  09/04/20 84.3 kg  08/14/20 85.7 kg  08/07/20 85.3 kg    Physical Activity: Not on file    Physical Exam Vitals reviewed.  HENT:     Head: Normocephalic and atraumatic.  Eyes:     Pupils: Pupils are equal, round, and reactive to light.  Cardiovascular:     Rate and Rhythm: Normal rate and regular rhythm.     Heart sounds: Normal heart sounds.  Pulmonary:     Effort: Pulmonary effort is normal.     Breath sounds: Normal breath sounds.  Abdominal:     General: Bowel sounds are normal.     Palpations: Abdomen is  soft.     Comments: Abdominal exam is soft.  He has well-healed laparotomy scars.  There is no tenderness to palpation.  He has no guarding or rebound tenderness.  There is no fluid wave.  There is no palpable liver or spleen tip.  Genitourinary:    Comments: On his rectal exam, he does have an external hemorrhoid.  This is not thrombosed or bleeding.  His rectal vault is smooth.  He has very little stool.  It is heme positive. Musculoskeletal:        General: No tenderness or deformity. Normal range of motion.     Cervical back: Normal range of motion.  Lymphadenopathy:     Cervical: No cervical adenopathy.  Skin:    General: Skin is warm and dry.     Findings: No erythema or rash.  Neurological:     Mental Status: He is alert and oriented to person, place, and time.  Psychiatric:        Behavior: Behavior normal.        Thought Content: Thought content normal.        Judgment: Judgment normal.     Lab Results  Component Value Date   WBC 3.7 (L) 09/04/2020   HGB 10.9 (L) 09/04/2020   HCT 33.4 (L) 09/04/2020   MCV 87.2 09/04/2020   PLT 201 09/04/2020   Lab Results  Component Value Date   FERRITIN 43 08/14/2020   IRON 63 08/14/2020   TIBC 358 08/14/2020   UIBC 294 08/14/2020   IRONPCTSAT 18 (L) 08/14/2020   Lab Results  Component Value Date   RETICCTPCT 1.6 09/04/2020   RBC 3.83 (L) 09/04/2020   RBC 3.79 (L) 09/04/2020   No results found for: KPAFRELGTCHN, LAMBDASER, KAPLAMBRATIO No results found for: IGGSERUM, IGA, IGMSERUM No results found for: Odetta Pink, SPEI   Chemistry      Component Value Date/Time   NA 141 09/04/2020 1005   K 3.7 09/04/2020 1005   CL 109 09/04/2020 1005   CO2 26 09/04/2020 1005   BUN 9 09/04/2020 1005   CREATININE 0.76 09/04/2020 1005   CREATININE 1.06 07/16/2012 1038      Component Value Date/Time   CALCIUM 9.1 09/04/2020 1005   ALKPHOS 400 (H) 09/04/2020 1005   AST 191 (HH)  09/04/2020 1005   ALT 245 (H) 09/04/2020 1005   BILITOT 5.6 (HH) 09/04/2020 1005       Impression and Plan: Jonathan Lewis is a very pleasant 80 yo African American gentleman with stage IIIA (T3N2M0) adenocarcinoma of the stomach, HER2-/PD-L1+. He had a distal gastrectomy on 12/15/2020andwas treated with adjuvant  FOLFOX.   He now has metastatic disease.  He is on treatment with Taxol/Cyramza.  He has had only 1 cycle to  date.  We had to hold the Cyramza because of GI bleeding.  The problem now is that his jaundice.  His bilirubin is elevated.  I really hope that we are are not looking at intrahepatic biliary dilatation and obstruction.  We will see about the MRCP.  We will to set this up for Monday.  Once I get the results back, we may have to get him over to gastroenterology to see if they can hopefully place a stent.  I know that this is a very tough problem right now.  This could certainly indicate that he has progressive disease.  Our treatment options certainly are not that great.  I talked to [employer] about the situation.  I explained to him the problem that we have right now.  I explained why we cannot treat him because of the biliary obstruction and elevated liver tests.  He understands all of this.  He has done everything we have asked him to do.  I just feel bad that we are in this situation right now.  He has a very strong faith.  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  We will plan to get him back depending on what we find with the MRCP.   Volanda Napoleon, MD 6/3/202211:17 AM

## 2020-09-04 NOTE — Patient Instructions (Signed)
Tunneled Central Venous Catheter Flushing Guide  It is important to flush your tunneled central venous catheter each time you use it, both before and after you use it. Flushing your catheter will help prevent it from clogging. What are the risks? Risks may include:  Infection.  Air getting into the catheter and bloodstream. Supplies needed:  A clean pair of gloves.  A disinfecting wipe. Use an alcohol wipe, chlorhexidine wipe, or iodine wipe as told by your health care provider.  A 10 mL syringe that has been prefilled with saline solution.  An empty 10 mL syringe, if a substance called heparin was injected into your catheter. How to flush your catheter When you flush your catheter, make sure you follow any specific instructions from your health care provider or the manufacturer. These are general guidelines. Flushing your catheter before use If there is heparin in your catheter: 1. Wash your hands with soap and water. 2. Put on gloves. 3. Scrub the injection cap for a minimum of 15 seconds with a disinfecting wipe. 4. Unclamp the catheter. 5. Attach the empty syringe to the injection cap. 6. Pull the syringe plunger back and withdraw 10 mL of blood. 7. Place the syringe into an appropriate waste container. 8. Scrub the injection cap for 15 seconds with a disinfecting wipe. 9. Attach the prefilled syringe to the injection cap. 10. Flush the catheter by pushing the plunger forward until all the liquid from the syringe is in the catheter. 11. Remove the syringe from the injection cap. 12. Clamp the catheter. If there is no heparin in your catheter: 1. Wash your hands with soap and water. 2. Put on gloves. 3. Scrub the injection cap for 15 seconds with a disinfecting wipe. 4. Unclamp the catheter. 5. Attach the prefilled syringe to the injection cap. 6. Flush the catheter by pushing the plunger forward until 5 mL of the liquid from the syringe is in the catheter. 7. Pull back on  the syringe until you see blood in the catheter. 8. If you have been asked to collect any blood, follow your health care provider's instructions. Otherwise, flush the catheter with the rest of the solution from the syringe. 9. Remove the syringe from the injection cap. 10. Clamp the catheter.   Flushing your catheter after use 1. Wash your hands with soap and water. 2. Put on gloves. 3. Scrub the injection cap for 15 seconds with a disinfecting wipe. 4. Unclamp the catheter. 5. Attach the prefilled syringe to the injection cap. 6. Flush the catheter by pushing the plunger forward until all of the liquid from the syringe is in the catheter. 7. Remove the syringe from the injection cap. 8. Clamp the catheter. Problems and solutions  If blood cannot be completely cleared from the injection cap, you may need to have the injection cap replaced.  If the catheter is difficult to flush, use the pulsing method. The pulsing method involves pushing only a few milliliters of solution into the catheter at a time and pausing between pushes.  If you do not see blood in the catheter when you pull back on the syringe, change your body position, such as by raising your arms above your head. Take a deep breath and cough. Then, pull back on the syringe. If you still do not see blood, flush the catheter with a small amount of solution. Then, change positions again and take a breath or cough. Pull back on the syringe again. If you still do not   see blood, finish flushing the catheter and contact your health care provider. Do not use your catheter until your health care provider says it is okay. General tips  Have someone help you flush your catheter, if possible.  Do not force fluid through your catheter.  Do not use a syringe that is larger or smaller than 10 mL. Using a smaller syringe can make the catheter burst.  Do not use your catheter without flushing it first if it has heparin in it. Contact a health  care provider if:  You cannot see any blood in the catheter when you flush it before using it.  Your catheter is difficult to flush. Get help right away if:  You cannot flush the catheter.  The catheter leaks when you flush it or when there is fluid in it.  There are cracks or breaks in the catheter. Summary  It is important to flush your tunneled central venous catheter each time you use it, both before and after you use it.  Scrub the injection cap for 15 seconds with a disinfecting wipe before and after you flush it.  When you flush your catheter, make sure you follow any specific instructions from your health care provider or the manufacturer.  Get help right away if you cannot flush the catheter. This information is not intended to replace advice given to you by your health care provider. Make sure you discuss any questions you have with your health care provider. Document Revised: 05/30/2019 Document Reviewed: 06/06/2018 Elsevier Patient Education  2021 Elsevier Inc.  

## 2020-09-04 NOTE — Progress Notes (Signed)
11:39 AM Patient is not going to be treated today per Dr. Marin Olp.

## 2020-09-04 NOTE — Addendum Note (Signed)
Addended by: Cottie Banda on: 09/04/2020 11:39 AM   Modules accepted: Orders, SmartSet

## 2020-09-05 LAB — T4: T4, Total: 8.7 ug/dL (ref 4.5–12.0)

## 2020-09-07 ENCOUNTER — Telehealth: Payer: Self-pay | Admitting: Gastroenterology

## 2020-09-07 ENCOUNTER — Other Ambulatory Visit: Payer: Self-pay

## 2020-09-07 DIAGNOSIS — C162 Malignant neoplasm of body of stomach: Secondary | ICD-10-CM

## 2020-09-07 LAB — IRON AND TIBC
Iron: 108 ug/dL (ref 42–163)
Saturation Ratios: 36 % (ref 20–55)
TIBC: 296 ug/dL (ref 202–409)
UIBC: 189 ug/dL (ref 117–376)

## 2020-09-07 LAB — FERRITIN: Ferritin: 1251 ng/mL — ABNORMAL HIGH (ref 24–336)

## 2020-09-07 LAB — TSH: TSH: 2.431 u[IU]/mL (ref 0.320–4.118)

## 2020-09-07 NOTE — Telephone Encounter (Signed)
Inbound call from pt stating he needs to resch his procedure from 09/09/20 at [facility]. Please give him a call. Thank you

## 2020-09-07 NOTE — Telephone Encounter (Signed)
I spoke with the pt and he tells me that he wants to cancel the procedure that was just added this afternoon.  I explained to him that he should keep the appt as planned and if he has any questions as to the timing he should speak with Dr Marin Olp. I explained the importance of the procedure and he tells me that he will leave it as planned.  FYI

## 2020-09-07 NOTE — Telephone Encounter (Signed)
Appt made for 09/09/20 at 1130 am at [facility] with GM.  Will call pt after 415 pm

## 2020-09-07 NOTE — Telephone Encounter (Signed)
Thanks again. GM

## 2020-09-07 NOTE — Telephone Encounter (Signed)
Patient with history of GJ and Bilroth 2 anatomy after gastric adenocarcinoma diagnosed. Now has malignant obstruction from metastatic LAN vs lesion from the pancreas. Needs attempt at endoscopic drainage if possible or will need a Percutaneous biliary drain by IR. Bypass interventions will be more difficult to obtain biliary access but worthwhile for an attempt.   Have spoken with the Hospital staff and we can add patient on for Wednesday AM in attempt of ERCP to reach the area (have held slot for 1130 that morning). Dr. Marin Olp will be reaching out to the patient later this afternoon to talk with him about the results of his MRICP. My team (RN Gerarda Fraction) will be able to try and reach the patient after 415 this afternoon or early tomorrow to get him on the books for Wednesday. He will need to know that if we are not successful at getting access, he will need to come into the hospital for Interventional Radiology attempt at Percutaneous biliary drainage catheter placement so they should bring a bag in case he has to end up staying in the hospital for a day or so after the procedure.  Thanks to all.   Justice Britain, MD [facility] Office # [phone]

## 2020-09-07 NOTE — Telephone Encounter (Signed)
Patty, Thank you for talking with him. Hopefully he will keep the slot otherwise I have no other availability this week as an outpatient and he will likely have to just come into the hospital and have a percutaneous drain placed. Thanks. GM

## 2020-09-07 NOTE — Telephone Encounter (Signed)
The pt has been advised of the appt and instructions.  He will have his granddaughter pull the instructions off of My Chart and call back if he has any questions or concerns.

## 2020-09-08 ENCOUNTER — Telehealth: Payer: Self-pay

## 2020-09-08 NOTE — Telephone Encounter (Signed)
No 09/04/20 los, noted 09/08/20  Dima Mini

## 2020-09-08 NOTE — Progress Notes (Signed)
Attempted to obtain medical history via telephone, unable to reach at this time.Voicemail ws full unable to leave message. Spoke with daughter made her aware of arrival time and location and medications he may take the morning of procedure. She verbalized understanding.

## 2020-09-08 NOTE — Telephone Encounter (Signed)
Gabe thanks very much for helping out with this urgent case. His anatomy is altered and one of his small bowel limbs was a bit difficult to evaluate on recent EGD, hopefully this doesn't pose problems for you. Thanks again for getting him in so quickly.

## 2020-09-09 ENCOUNTER — Ambulatory Visit (HOSPITAL_COMMUNITY): Payer: Medicare PPO

## 2020-09-09 ENCOUNTER — Observation Stay (HOSPITAL_COMMUNITY): Payer: Medicare PPO

## 2020-09-09 ENCOUNTER — Ambulatory Visit (HOSPITAL_COMMUNITY): Payer: Medicare PPO | Admitting: Certified Registered Nurse Anesthetist

## 2020-09-09 ENCOUNTER — Encounter (HOSPITAL_COMMUNITY)
Admission: RE | Disposition: A | Discharge status: Home / Self Care | Payer: Self-pay | Source: Home / Self Care | Attending: Internal Medicine

## 2020-09-09 ENCOUNTER — Inpatient Hospital Stay (HOSPITAL_COMMUNITY)
Admission: RE | Admit: 2020-09-09 | Discharge: 2020-09-12 | DRG: 375 | Disposition: A | Discharge status: Home / Self Care | Payer: Medicare PPO | Attending: Internal Medicine | Admitting: Internal Medicine

## 2020-09-09 ENCOUNTER — Encounter (HOSPITAL_COMMUNITY): Payer: Self-pay | Admitting: Gastroenterology

## 2020-09-09 ENCOUNTER — Other Ambulatory Visit: Payer: Self-pay

## 2020-09-09 DIAGNOSIS — Z83438 Family history of other disorder of lipoprotein metabolism and other lipidemia: Secondary | ICD-10-CM | POA: Diagnosis not present

## 2020-09-09 DIAGNOSIS — K869 Disease of pancreas, unspecified: Secondary | ICD-10-CM | POA: Diagnosis present

## 2020-09-09 DIAGNOSIS — I10 Essential (primary) hypertension: Secondary | ICD-10-CM | POA: Diagnosis present

## 2020-09-09 DIAGNOSIS — C786 Secondary malignant neoplasm of retroperitoneum and peritoneum: Secondary | ICD-10-CM | POA: Diagnosis present

## 2020-09-09 DIAGNOSIS — E785 Hyperlipidemia, unspecified: Secondary | ICD-10-CM | POA: Diagnosis present

## 2020-09-09 DIAGNOSIS — H409 Unspecified glaucoma: Secondary | ICD-10-CM | POA: Diagnosis present

## 2020-09-09 DIAGNOSIS — K7689 Other specified diseases of liver: Secondary | ICD-10-CM | POA: Diagnosis present

## 2020-09-09 DIAGNOSIS — Z981 Arthrodesis status: Secondary | ICD-10-CM

## 2020-09-09 DIAGNOSIS — K219 Gastro-esophageal reflux disease without esophagitis: Secondary | ICD-10-CM | POA: Diagnosis present

## 2020-09-09 DIAGNOSIS — R932 Abnormal findings on diagnostic imaging of liver and biliary tract: Secondary | ICD-10-CM | POA: Diagnosis not present

## 2020-09-09 DIAGNOSIS — K5900 Constipation, unspecified: Secondary | ICD-10-CM | POA: Diagnosis present

## 2020-09-09 DIAGNOSIS — Z79899 Other long term (current) drug therapy: Secondary | ICD-10-CM | POA: Diagnosis not present

## 2020-09-09 DIAGNOSIS — Z8249 Family history of ischemic heart disease and other diseases of the circulatory system: Secondary | ICD-10-CM | POA: Diagnosis not present

## 2020-09-09 DIAGNOSIS — Z833 Family history of diabetes mellitus: Secondary | ICD-10-CM | POA: Diagnosis not present

## 2020-09-09 DIAGNOSIS — Z9221 Personal history of antineoplastic chemotherapy: Secondary | ICD-10-CM

## 2020-09-09 DIAGNOSIS — Z87891 Personal history of nicotine dependence: Secondary | ICD-10-CM | POA: Diagnosis not present

## 2020-09-09 DIAGNOSIS — M1990 Unspecified osteoarthritis, unspecified site: Secondary | ICD-10-CM | POA: Diagnosis present

## 2020-09-09 DIAGNOSIS — K828 Other specified diseases of gallbladder: Secondary | ICD-10-CM | POA: Diagnosis not present

## 2020-09-09 DIAGNOSIS — Z20822 Contact with and (suspected) exposure to covid-19: Secondary | ICD-10-CM | POA: Diagnosis present

## 2020-09-09 DIAGNOSIS — R17 Unspecified jaundice: Secondary | ICD-10-CM | POA: Diagnosis not present

## 2020-09-09 DIAGNOSIS — C772 Secondary and unspecified malignant neoplasm of intra-abdominal lymph nodes: Secondary | ICD-10-CM | POA: Diagnosis present

## 2020-09-09 DIAGNOSIS — C169 Malignant neoplasm of stomach, unspecified: Principal | ICD-10-CM | POA: Diagnosis present

## 2020-09-09 DIAGNOSIS — K831 Obstruction of bile duct: Secondary | ICD-10-CM | POA: Diagnosis present

## 2020-09-09 DIAGNOSIS — C162 Malignant neoplasm of body of stomach: Secondary | ICD-10-CM

## 2020-09-09 DIAGNOSIS — D509 Iron deficiency anemia, unspecified: Secondary | ICD-10-CM | POA: Diagnosis present

## 2020-09-09 DIAGNOSIS — K449 Diaphragmatic hernia without obstruction or gangrene: Secondary | ICD-10-CM | POA: Diagnosis not present

## 2020-09-09 DIAGNOSIS — E1140 Type 2 diabetes mellitus with diabetic neuropathy, unspecified: Secondary | ICD-10-CM | POA: Diagnosis present

## 2020-09-09 DIAGNOSIS — Z538 Procedure and treatment not carried out for other reasons: Secondary | ICD-10-CM | POA: Diagnosis not present

## 2020-09-09 DIAGNOSIS — Z85028 Personal history of other malignant neoplasm of stomach: Secondary | ICD-10-CM | POA: Diagnosis not present

## 2020-09-09 DIAGNOSIS — Z903 Acquired absence of stomach [part of]: Secondary | ICD-10-CM

## 2020-09-09 DIAGNOSIS — N200 Calculus of kidney: Secondary | ICD-10-CM | POA: Diagnosis not present

## 2020-09-09 DIAGNOSIS — K838 Other specified diseases of biliary tract: Secondary | ICD-10-CM | POA: Diagnosis not present

## 2020-09-09 DIAGNOSIS — Z9689 Presence of other specified functional implants: Secondary | ICD-10-CM | POA: Diagnosis not present

## 2020-09-09 DIAGNOSIS — R7401 Elevation of levels of liver transaminase levels: Secondary | ICD-10-CM | POA: Diagnosis not present

## 2020-09-09 DIAGNOSIS — N400 Enlarged prostate without lower urinary tract symptoms: Secondary | ICD-10-CM | POA: Diagnosis present

## 2020-09-09 DIAGNOSIS — Z48815 Encounter for surgical aftercare following surgery on the digestive system: Secondary | ICD-10-CM | POA: Diagnosis not present

## 2020-09-09 HISTORY — PX: SUBMUCOSAL TATTOO INJECTION: SHX6856

## 2020-09-09 HISTORY — PX: ENDOSCOPIC RETROGRADE CHOLANGIOPANCREATOGRAPHY (ERCP) WITH PROPOFOL: SHX5810

## 2020-09-09 HISTORY — PX: IR BILIARY DRAIN PLACEMENT WITH CHOLANGIOGRAM: IMG6043

## 2020-09-09 LAB — COMPREHENSIVE METABOLIC PANEL
ALT: 300 U/L — ABNORMAL HIGH (ref 0–44)
AST: 271 U/L — ABNORMAL HIGH (ref 15–41)
Albumin: 3.4 g/dL — ABNORMAL LOW (ref 3.5–5.0)
Alkaline Phosphatase: 467 U/L — ABNORMAL HIGH (ref 38–126)
Anion gap: 8 (ref 5–15)
BUN: 11 mg/dL (ref 8–23)
CO2: 26 mmol/L (ref 22–32)
Calcium: 9.2 mg/dL (ref 8.9–10.3)
Chloride: 107 mmol/L (ref 98–111)
Creatinine, Ser: 0.8 mg/dL (ref 0.61–1.24)
GFR, Estimated: >60 mL/min (ref >60)
Glucose, Bld: 114 mg/dL — ABNORMAL HIGH (ref 70–99)
Potassium: 3.4 mmol/L — ABNORMAL LOW (ref 3.5–5.1)
Sodium: 141 mmol/L (ref 135–145)
Total Bilirubin: 10.6 mg/dL — ABNORMAL HIGH (ref 0.3–1.2)
Total Protein: 6.9 g/dL (ref 6.5–8.1)

## 2020-09-09 LAB — PROTIME-INR
INR: 1 (ref 0.8–1.2)
Prothrombin Time: 13.2 seconds (ref 11.4–15.2)

## 2020-09-09 SURGERY — ENDOSCOPIC RETROGRADE CHOLANGIOPANCREATOGRAPHY (ERCP) WITH PROPOFOL
Anesthesia: General

## 2020-09-09 MED ORDER — ONDANSETRON HCL 4 MG/2ML IJ SOLN
INTRAMUSCULAR | Status: DC | PRN
  Administered 2020-09-09: 4 mg via INTRAVENOUS

## 2020-09-09 MED ORDER — GLUCAGON HCL RDNA (DIAGNOSTIC) 1 MG IJ SOLR
INTRAMUSCULAR | Status: AC
  Filled 2020-09-09: qty 1

## 2020-09-09 MED ORDER — SODIUM CHLORIDE 0.9 % IV SOLN: INTRAVENOUS | Status: DC

## 2020-09-09 MED ORDER — MIDAZOLAM HCL 2 MG/2ML IJ SOLN
INTRAMUSCULAR | Status: AC | PRN
  Administered 2020-09-09 (×3): 1 mg via INTRAVENOUS

## 2020-09-09 MED ORDER — CIPROFLOXACIN IN D5W 400 MG/200ML IV SOLN
INTRAVENOUS | Status: DC | PRN
  Administered 2020-09-09: 400 mg via INTRAVENOUS

## 2020-09-09 MED ORDER — LIDOCAINE-EPINEPHRINE 1 %-1:100000 IJ SOLN
INTRAMUSCULAR | Status: AC | PRN
  Administered 2020-09-09: 10 mL

## 2020-09-09 MED ORDER — FENTANYL CITRATE (PF) 100 MCG/2ML IJ SOLN
INTRAMUSCULAR | Status: AC
  Filled 2020-09-09: qty 2

## 2020-09-09 MED ORDER — ONDANSETRON HCL 4 MG PO TABS
4.0000 mg | ORAL_TABLET | Freq: Four times a day (QID) | ORAL | Status: DC | PRN
  Filled 2020-09-09: qty 1

## 2020-09-09 MED ORDER — FENTANYL CITRATE (PF) 100 MCG/2ML IJ SOLN
INTRAMUSCULAR | Status: AC | PRN
  Administered 2020-09-09 (×2): 50 ug via INTRAVENOUS

## 2020-09-09 MED ORDER — PHENYLEPHRINE HCL-NACL 10-0.9 MG/250ML-% IV SOLN
INTRAVENOUS | Status: DC | PRN
  Administered 2020-09-09: 75 ug/min via INTRAVENOUS

## 2020-09-09 MED ORDER — SODIUM CHLORIDE 0.9 % IV SOLN
2.0000 g | Freq: Once | INTRAVENOUS | Status: AC
  Administered 2020-09-09: 2 g via INTRAVENOUS
  Filled 2020-09-09: qty 2

## 2020-09-09 MED ORDER — LATANOPROST 0.005 % OP SOLN
1.0000 [drp] | Freq: Every day | OPHTHALMIC | Status: DC
  Administered 2020-09-09 – 2020-09-11 (×3): 1 [drp] via OPHTHALMIC
  Filled 2020-09-09: qty 2.5

## 2020-09-09 MED ORDER — INDOMETHACIN 50 MG RE SUPP
RECTAL | Status: AC
  Filled 2020-09-09: qty 2

## 2020-09-09 MED ORDER — PROPOFOL 10 MG/ML IV BOLUS
INTRAVENOUS | Status: DC | PRN
  Administered 2020-09-09: 150 mg via INTRAVENOUS

## 2020-09-09 MED ORDER — ONDANSETRON HCL 4 MG/2ML IJ SOLN: 4.0000 mg | Freq: Four times a day (QID) | INTRAMUSCULAR | Status: DC | PRN

## 2020-09-09 MED ORDER — MECLIZINE HCL 12.5 MG PO TABS
12.5000 mg | ORAL_TABLET | Freq: Every day | ORAL | Status: DC
  Administered 2020-09-10 – 2020-09-12 (×3): 12.5 mg via ORAL
  Filled 2020-09-09 (×3): qty 1

## 2020-09-09 MED ORDER — SUGAMMADEX SODIUM 200 MG/2ML IV SOLN
INTRAVENOUS | Status: DC | PRN
  Administered 2020-09-09: 200 mg via INTRAVENOUS

## 2020-09-09 MED ORDER — LIDOCAINE HCL 1 % IJ SOLN
INTRAMUSCULAR | Status: AC
  Filled 2020-09-09: qty 20

## 2020-09-09 MED ORDER — POLYVINYL ALCOHOL 1.4 % OP SOLN
1.0000 [drp] | Freq: Three times a day (TID) | OPHTHALMIC | Status: DC | PRN
  Filled 2020-09-09: qty 15

## 2020-09-09 MED ORDER — IOHEXOL 300 MG/ML  SOLN
100.0000 mL | Freq: Once | INTRAMUSCULAR | Status: AC | PRN
  Administered 2020-09-09: 45 mL

## 2020-09-09 MED ORDER — INDOMETHACIN 50 MG RE SUPP
RECTAL | Status: DC | PRN
  Administered 2020-09-09: 100 mg via RECTAL

## 2020-09-09 MED ORDER — ADULT MULTIVITAMIN W/MINERALS CH
1.0000 | ORAL_TABLET | Freq: Every day | ORAL | Status: DC
  Administered 2020-09-10 – 2020-09-12 (×3): 1 via ORAL
  Filled 2020-09-09 (×3): qty 1

## 2020-09-09 MED ORDER — EPHEDRINE SULFATE-NACL 50-0.9 MG/10ML-% IV SOSY
PREFILLED_SYRINGE | INTRAVENOUS | Status: DC | PRN
  Administered 2020-09-09 (×2): 10 mg via INTRAVENOUS
  Administered 2020-09-09: 5 mg via INTRAVENOUS

## 2020-09-09 MED ORDER — LACTATED RINGERS IV SOLN: INTRAVENOUS | Status: DC

## 2020-09-09 MED ORDER — ROCURONIUM BROMIDE 10 MG/ML (PF) SYRINGE
PREFILLED_SYRINGE | INTRAVENOUS | Status: DC | PRN
  Administered 2020-09-09: 10 mg via INTRAVENOUS
  Administered 2020-09-09: 60 mg via INTRAVENOUS

## 2020-09-09 MED ORDER — FAMOTIDINE 20 MG PO TABS
20.0000 mg | ORAL_TABLET | Freq: Every day | ORAL | Status: DC
  Administered 2020-09-10 – 2020-09-12 (×3): 20 mg via ORAL
  Filled 2020-09-09 (×3): qty 1

## 2020-09-09 MED ORDER — ATORVASTATIN CALCIUM 20 MG PO TABS
20.0000 mg | ORAL_TABLET | Freq: Every day | ORAL | Status: DC
  Administered 2020-09-10 – 2020-09-12 (×3): 20 mg via ORAL
  Filled 2020-09-09 (×3): qty 1

## 2020-09-09 MED ORDER — PHENYLEPHRINE 40 MCG/ML (10ML) SYRINGE FOR IV PUSH (FOR BLOOD PRESSURE SUPPORT)
PREFILLED_SYRINGE | INTRAVENOUS | Status: DC | PRN
  Administered 2020-09-09: 80 ug via INTRAVENOUS
  Administered 2020-09-09: 120 ug via INTRAVENOUS
  Administered 2020-09-09: 80 ug via INTRAVENOUS

## 2020-09-09 MED ORDER — POLYETHYL GLYCOL-PROPYL GLYCOL 0.4-0.3 % OP SOLN: 1.0000 [drp] | Freq: Three times a day (TID) | OPHTHALMIC | Status: DC | PRN

## 2020-09-09 MED ORDER — FENTANYL CITRATE (PF) 250 MCG/5ML IJ SOLN
INTRAMUSCULAR | Status: DC | PRN
  Administered 2020-09-09 (×2): 50 ug via INTRAVENOUS

## 2020-09-09 MED ORDER — MIDAZOLAM HCL 2 MG/2ML IJ SOLN
INTRAMUSCULAR | Status: AC
  Filled 2020-09-09: qty 4

## 2020-09-09 MED ORDER — CIPROFLOXACIN IN D5W 400 MG/200ML IV SOLN
INTRAVENOUS | Status: AC
  Filled 2020-09-09: qty 200

## 2020-09-09 MED ORDER — LIDOCAINE 2% (20 MG/ML) 5 ML SYRINGE
INTRAMUSCULAR | Status: DC | PRN
  Administered 2020-09-09: 60 mg via INTRAVENOUS

## 2020-09-09 MED ORDER — SPOT INK MARKER SYRINGE KIT
PACK | SUBMUCOSAL | Status: DC | PRN
  Administered 2020-09-09: 4 mL via SUBMUCOSAL

## 2020-09-09 MED ORDER — DORZOLAMIDE HCL-TIMOLOL MAL 2-0.5 % OP SOLN
1.0000 [drp] | Freq: Two times a day (BID) | OPHTHALMIC | Status: DC
  Administered 2020-09-10 – 2020-09-12 (×5): 1 [drp] via OPHTHALMIC
  Filled 2020-09-09: qty 10

## 2020-09-09 NOTE — H&P (Signed)
History and Physical    Jonathan Lewis:[id number] DOB: Sep 03, 1940 DOA: 09/09/2020  PCP: Biagio Borg, MD  Patient coming from: Home  Chief Complaint: abnormal labs  HPI: Jonathan Lewis is a 80 y.o. male with medical history significant of gastric cancer, HLD, glaucoma. Presenting with biliary obstruction. He was seen in clinic with his oncologist on 09/04/20 and found to have an elevated bilirubin. An MRCP was ordered that showed diffuse biliary ductal dilation w/ a soft tissue mass. GI was consulted and the patient had an ERCP arranged for today. He is now s/p ERCP. A stent was unable to be placed. [facility] spoke with IR about a percutaneous drain placement. IR will take him for his procedure but is not currently sure when it can be done. It has been requested that [facility] admit the patient for this procedure.    Review of Systems:  Review of systems is otherwise negative for all not mentioned in HPI.   PMHx Past Medical History:  Diagnosis Date  . Acoustic neuroma ([place]) 06/25/2015  . Allergic rhinitis 07/18/2014  . Allergy   . Anemia   . Arthritis   . BPH (benign prostatic hyperplasia)   . Cancer ([place])    stage III stomach-Dx 03/2019  . Carbuncle    recurrent MRSA carbuncles  . Cataract    Per pt bilateral cataracts removed.  . Diabetes mellitus    Type II. Per pt Dr. Jenny Reichmann took him off his dm med.  . Disk prolapse   . Glaucoma   . Hyperlipidemia   . Hypertension   . Male hypogonadism 07/16/2014  . Neuropathy   . Pneumonia   . Sinus bradycardia    chronic, asymptomatic  . Sinusitis 07/29/2012    PSHx Past Surgical History:  Procedure Laterality Date  . ANTERIOR CERVICAL DECOMPRESSION/DISCECTOMY FUSION 4 LEVELS N/A 01/08/2020   Procedure: ANTERIOR CERVICAL DECOMPRESSION FUSION CERVICAL 3-4, CERVICAL 4-5, CERVIAL 5-6 WITH INSTRUMENTATION AND ALLOGRAFT;  Surgeon: Phylliss Bob, MD;  Location: [facility];  Service: Orthopedics;  Laterality: N/A;  . BALLOON DILATION N/A 03/12/2019    Procedure: BALLOON DILATION;  Surgeon: Rush Landmark Telford Nab., MD;  Location: [facility];  Service: Gastroenterology;  Laterality: N/A;  pyloric  . BIOPSY  03/08/2019   Procedure: BIOPSY;  Surgeon: Yetta Flock, MD;  Location: [facility];  Service: Gastroenterology;;  . BIOPSY  03/12/2019   Procedure: BIOPSY;  Surgeon: Irving Copas., MD;  Location: [facility];  Service: Gastroenterology;;  . CATARACT EXTRACTION     x 2  . COLONOSCOPY    . ESOPHAGOGASTRODUODENOSCOPY N/A 03/08/2019   Procedure: ESOPHAGOGASTRODUODENOSCOPY (EGD);  Surgeon: Yetta Flock, MD;  Location: [facility];  Service: Gastroenterology;  Laterality: N/A;  . ESOPHAGOGASTRODUODENOSCOPY (EGD) WITH PROPOFOL N/A 03/12/2019   Procedure: ESOPHAGOGASTRODUODENOSCOPY (EGD) WITH PROPOFOL;  Surgeon: Rush Landmark Telford Nab., MD;  Location: [facility];  Service: Gastroenterology;  Laterality: N/A;  . FINE NEEDLE ASPIRATION  03/12/2019   Procedure: FINE NEEDLE ASPIRATION (FNA) LINEAR;  Surgeon: Irving Copas., MD;  Location: [facility];  Service: Gastroenterology;;  . FOREIGN BODY REMOVAL  03/08/2019   Procedure: FOREIGN BODY REMOVAL;  Surgeon: Yetta Flock, MD;  Location: [facility];  Service: Gastroenterology;;  . IR PATIENT EVAL TECH 0-60 MINS  04/03/2019  . LAPAROTOMY N/A 03/19/2019   Procedure: EXPLORATORY LAPAROTOMY, distal GASTRECTOMY AND PLACEMENT OF G AND J TUBE, gastric jejunostomy;  Surgeon: Greer Pickerel, MD;  Location: [facility];  Service: General;  Laterality: N/A;  . POLYPECTOMY    .  PORTACATH PLACEMENT N/A 03/26/2019   Procedure: INSERTION PORT-A-CATH;  Surgeon: Michael Boston, MD;  Location: [facility];  Service: General;  Laterality: N/A;  . STOMACH SURGERY    . UPPER ESOPHAGEAL ENDOSCOPIC ULTRASOUND (EUS) N/A 03/12/2019   Procedure: UPPER ESOPHAGEAL ENDOSCOPIC ULTRASOUND (EUS);  Surgeon: Irving Copas., MD;  Location: [facility];  Service: Gastroenterology;  Laterality:  N/A;  . UPPER GASTROINTESTINAL ENDOSCOPY    . VIDEO BRONCHOSCOPY WITH ENDOBRONCHIAL ULTRASOUND N/A 05/22/2019   Procedure: VIDEO BRONCHOSCOPY WITH ENDOBRONCHIAL ULTRASOUND;  Surgeon: Garner Nash, DO;  Location: [facility];  Service: Thoracic;  Laterality: N/A;    SocHx  reports that he quit smoking about 41 years ago. His smoking use included cigarettes. He has a 24.00 pack-year smoking history. He has never used smokeless tobacco. He reports previous alcohol use. He reports that he does not use drugs.  No Known Allergies  FamHx Family History  Problem Relation Age of Onset  . Cancer Mother   . Hypertension Mother   . Diabetes Sister   . Cancer Brother        colon  . Hypertension Brother   . Hyperlipidemia Brother   . Stroke Brother   . Hypertension Sister   . Colon cancer Neg Hx   . Heart attack Neg Hx   . Sudden death Neg Hx   . Esophageal cancer Neg Hx   . Pancreatic cancer Neg Hx   . Stomach cancer Neg Hx   . Rectal cancer Neg Hx     Prior to Admission medications   Medication Sig Start Date End Date Taking? Authorizing Provider  atorvastatin (LIPITOR) 20 MG tablet Take 1 tablet (20 mg total) by mouth daily. Patient taking differently: Take 20 mg by mouth in the morning. 04/26/20 04/26/21 Yes Biagio Borg, MD  dorzolamide-timolol (COSOPT) 22.3-6.8 MG/ML ophthalmic solution Place 1 drop into both eyes 2 (two) times daily. 06/15/20  Yes [provider]  famotidine (PEPCID) 20 MG tablet Take 20 mg by mouth in the morning.   Yes [provider]  latanoprost (XALATAN) 0.005 % ophthalmic solution Place 1 drop into both eyes at bedtime.  01/09/19  Yes [provider]  meclizine (ANTIVERT) 12.5 MG tablet TAKE 1 TABLET BY MOUTH 3 TIMES DAILY AS NEEDED FOR DIZZINESS. Patient taking differently: Take 12.5 mg by mouth in the morning. 08/10/20  Yes Biagio Borg, MD  Multiple Vitamin (MULTIVITAMIN WITH MINERALS) TABS tablet Take 1 tablet by mouth in the morning.  Men's One-A-Day Chewable   Yes [provider]  ondansetron (ZOFRAN) 8 MG tablet Take 1 tablet (8 mg total) by mouth 2 (two) times daily as needed (Nausea or vomiting). 07/23/20  Yes Ennever, Rudell Cobb, MD  SYSTANE ULTRA 0.4-0.3 % SOLN Apply 1 drop to eye 3 (three) times daily as needed (dry eyes).  01/09/19  Yes [provider]  traMADol (ULTRAM) 50 MG tablet Take 2 tablets (100 mg total) by mouth at bedtime as needed for moderate pain or severe pain. 04/22/20  Yes Biagio Borg, MD  fluconazole (DIFLUCAN) 100 MG tablet Take 1 tablet (100 mg total) by mouth daily. Patient not taking: Reported on 09/08/2020 08/07/20   Yetta Flock, MD  lidocaine-prilocaine (EMLA) cream Apply 1 application topically as needed. Place on the port one hour before appointment. 08/24/20   Volanda Napoleon, MD    Physical Exam: Vitals:   09/09/20 1009 09/09/20 1341  BP: (!) 161/81 (!) 151/97  Pulse: 63 73  Resp: 13 17  Temp: 98 F (36.7 C) 98.1 F (36.7 C)  TempSrc: Oral Oral  SpO2: 100% 100%  Weight: 84.3 kg   Height: 5\' 8"  (1.727 m)     General: 80 y.o. male resting in bed in NAD Eyes: PERRL, normal sclera ENMT: Nares patent w/o discharge, orophaynx clear, dentition normal, ears w/o discharge/lesions/ulcers Neck: Supple, trachea midline Cardiovascular: RRR, +S1, S2, no m/g/r, equal pulses throughout Respiratory: CTABL, no w/r/r, normal WOB GI: BS+, NDNT, no masses noted, no organomegaly noted MSK: No e/c/c Skin: No rashes, bruises, ulcerations noted Neuro: A&O x 3, no focal deficits, somewhat groggy after anesthesia Psyc: Appropriate interaction and affect, calm/cooperative  Labs on Admission: I have personally reviewed following labs and imaging studies  CBC: Recent Labs  Lab 09/04/20 1005  WBC 3.7*  NEUTROABS 1.7  HGB 10.9*  HCT 33.4*  MCV 87.2  PLT 932   Basic Metabolic Panel: Recent Labs  Lab 09/04/20 1005  NA 141  K 3.7  CL 109  CO2 26  GLUCOSE 106*  BUN 9   CREATININE 0.76  CALCIUM 9.1   GFR: Estimated Creatinine Clearance: 76.6 mL/min (by C-G formula based on SCr of 0.76 mg/dL). Liver Function Tests: Recent Labs  Lab 09/04/20 1005  AST 191*  ALT 245*  ALKPHOS 400*  BILITOT 5.6*  PROT 6.2*  ALBUMIN 3.7   No results for input(s): LIPASE, AMYLASE in the last 168 hours. No results for input(s): AMMONIA in the last 168 hours. Coagulation Profile: No results for input(s): INR, PROTIME in the last 168 hours. Cardiac Enzymes: No results for input(s): CKTOTAL, CKMB, CKMBINDEX, TROPONINI in the last 168 hours. BNP (last 3 results) No results for input(s): PROBNP in the last 8760 hours. HbA1C: No results for input(s): HGBA1C in the last 72 hours. CBG: No results for input(s): GLUCAP in the last 168 hours. Lipid Profile: No results for input(s): CHOL, HDL, LDLCALC, TRIG, CHOLHDL, LDLDIRECT in the last 72 hours. Thyroid Function Tests: No results for input(s): TSH, T4TOTAL, FREET4, T3FREE, THYROIDAB in the last 72 hours. Anemia Panel: No results for input(s): VITAMINB12, FOLATE, FERRITIN, TIBC, IRON, RETICCTPCT in the last 72 hours. Urine analysis:    Component Value Date/Time   COLORURINE YELLOW 04/22/2020 [place] 04/22/2020 1246   LABSPEC 1.025 04/22/2020 1246   PHURINE 5.5 04/22/2020 1246   GLUCOSEU NEGATIVE 04/22/2020 1246   HGBUR NEGATIVE 04/22/2020 1246   HGBUR negative 11/30/2009 1450   BILIRUBINUR NEGATIVE 04/22/2020 [place] 04/22/2020 1246   PROTEINUR NEGATIVE 09/04/2020 1006   UROBILINOGEN 0.2 04/22/2020 1246   NITRITE NEGATIVE 04/22/2020 [place] 04/22/2020 1246    Radiological Exams on Admission: No results found.  EKG: None obtained  Assessment/Plan Biliary obstruction     - place in obs, med-surg     - unable to get stent placed today d/t anatomy; GI has spoken with IR; will attempt percutaneous drain placement     - fluids     - follow labs  Hx  of gastric CA     - continue follow up with onco  Chronic iron deficiency anemia     - continue follow up with onco  GERD     - protonix  HLD     - continue statin  [place]     - continue home regimen  Hx of DM2     - diet controlled     - last A1c was 6.6 on  04/22/20     - right now he's NPO for his procedure; can start DM diet after procedure completed  DVT prophylaxis: SCDs  Code Status: FULL  Family Communication: None at bedside  Consults called: GI spoke with IR  Status is: Observation  The patient remains OBS appropriate and will d/c before 2 midnights.  Dispo: The patient is from: Home              Anticipated d/c is to: Home              Patient currently is not medically stable to d/c.   Difficult to place patient No  Time spent coordinating admission: 45 minutes  [facility]  If 7PM-7AM, please contact night-coverage [email or web address]  09/09/2020, 1:50 PM

## 2020-09-09 NOTE — Consult Note (Signed)
Chief Complaint: Patient was seen in consultation today for percutaneous transhepatic cholangiogram with biliary drain placement  Referring Physician(s): Mansouraty,G  Supervising Physician: Simonne Come  Patient Status: [facility] - Out-pt  TBA  History of Present Illness: Jonathan Lewis is an 80 y.o. male with past medical history significant for acoustic neuroma, anemia, arthritis, BPH, diabetes, glaucoma, hypertension, hyperlipidemia, and gastric cancer with prior gastrectomy and Billroth II anatomy.  Patient had recent MRCP that showed diffuse biliary ductal dilatation with a soft tissue mass in porta hepatis, likely representing metastatic lymphadenopathy.  Also noted was a small mass in the pancreatic body along with multiple subcentimeter enhancing soft tissue nodules along the capsular surface of the left hepatic lobe and bilateral upper quadrant omental fat.  He underwent failed OP ERCP today.  Last bilirubin from 6/3 was 5.6.  Request now received from GI service for PTC with biliary drain placement.  Past Medical History:  Diagnosis Date  . Acoustic neuroma (HCC) 06/25/2015  . Allergic rhinitis 07/18/2014  . Allergy   . Anemia   . Arthritis   . BPH (benign prostatic hyperplasia)   . Cancer (HCC)    stage III stomach-Dx 03/2019  . Carbuncle    recurrent MRSA carbuncles  . Cataract    Per pt bilateral cataracts removed.  . Diabetes mellitus    Type II. Per pt Dr. Jonny Ruiz took him off his dm med.  . Disk prolapse   . Glaucoma   . Hyperlipidemia   . Hypertension   . Male hypogonadism 07/16/2014  . Neuropathy   . Pneumonia   . Sinus bradycardia    chronic, asymptomatic  . Sinusitis 07/29/2012    Past Surgical History:  Procedure Laterality Date  . ANTERIOR CERVICAL DECOMPRESSION/DISCECTOMY FUSION 4 LEVELS N/A 01/08/2020   Procedure: ANTERIOR CERVICAL DECOMPRESSION FUSION CERVICAL 3-4, CERVICAL 4-5, CERVIAL 5-6 WITH INSTRUMENTATION AND ALLOGRAFT;  Surgeon: Estill Bamberg,  MD;  Location: [facility] OR;  Service: Orthopedics;  Laterality: N/A;  . BALLOON DILATION N/A 03/12/2019   Procedure: BALLOON DILATION;  Surgeon: Meridee Score Netty Starring., MD;  Location: [facility];  Service: Gastroenterology;  Laterality: N/A;  pyloric  . BIOPSY  03/08/2019   Procedure: BIOPSY;  Surgeon: Benancio Deeds, MD;  Location: [facility];  Service: Gastroenterology;;  . BIOPSY  03/12/2019   Procedure: BIOPSY;  Surgeon: Lemar Lofty., MD;  Location: [facility];  Service: Gastroenterology;;  . CATARACT EXTRACTION     x 2  . COLONOSCOPY    . ESOPHAGOGASTRODUODENOSCOPY N/A 03/08/2019   Procedure: ESOPHAGOGASTRODUODENOSCOPY (EGD);  Surgeon: Benancio Deeds, MD;  Location: [facility];  Service: Gastroenterology;  Laterality: N/A;  . ESOPHAGOGASTRODUODENOSCOPY (EGD) WITH PROPOFOL N/A 03/12/2019   Procedure: ESOPHAGOGASTRODUODENOSCOPY (EGD) WITH PROPOFOL;  Surgeon: Meridee Score Netty Starring., MD;  Location: [facility];  Service: Gastroenterology;  Laterality: N/A;  . FINE NEEDLE ASPIRATION  03/12/2019   Procedure: FINE NEEDLE ASPIRATION (FNA) LINEAR;  Surgeon: Lemar Lofty., MD;  Location: [facility];  Service: Gastroenterology;;  . FOREIGN BODY REMOVAL  03/08/2019   Procedure: FOREIGN BODY REMOVAL;  Surgeon: Benancio Deeds, MD;  Location: [facility];  Service: Gastroenterology;;  . IR PATIENT EVAL TECH 0-60 MINS  04/03/2019  . LAPAROTOMY N/A 03/19/2019   Procedure: EXPLORATORY LAPAROTOMY, distal GASTRECTOMY AND PLACEMENT OF G AND J TUBE, gastric jejunostomy;  Surgeon: Gaynelle Adu, MD;  Location: [facility];  Service: General;  Laterality: N/A;  . POLYPECTOMY    . PORTACATH PLACEMENT N/A 03/26/2019   Procedure: INSERTION PORT-A-CATH;  Surgeon: Michael Boston, MD;  Location: [facility];  Service: General;  Laterality: N/A;  . STOMACH SURGERY    . UPPER ESOPHAGEAL ENDOSCOPIC ULTRASOUND (EUS) N/A 03/12/2019   Procedure: UPPER ESOPHAGEAL ENDOSCOPIC ULTRASOUND (EUS);   Surgeon: Irving Copas., MD;  Location: [facility];  Service: Gastroenterology;  Laterality: N/A;  . UPPER GASTROINTESTINAL ENDOSCOPY    . VIDEO BRONCHOSCOPY WITH ENDOBRONCHIAL ULTRASOUND N/A 05/22/2019   Procedure: VIDEO BRONCHOSCOPY WITH ENDOBRONCHIAL ULTRASOUND;  Surgeon: Garner Nash, DO;  Location: [facility] OR;  Service: Thoracic;  Laterality: N/A;    Allergies: Patient has no known allergies.  Medications: Prior to Admission medications   Medication Sig Start Date End Date Taking? Authorizing Provider  atorvastatin (LIPITOR) 20 MG tablet Take 1 tablet (20 mg total) by mouth daily. Patient taking differently: Take 20 mg by mouth in the morning. 04/26/20 04/26/21 Yes Biagio Borg, MD  dorzolamide-timolol (COSOPT) 22.3-6.8 MG/ML ophthalmic solution Place 1 drop into both eyes 2 (two) times daily. 06/15/20  Yes [provider]  famotidine (PEPCID) 20 MG tablet Take 20 mg by mouth in the morning.   Yes [provider]  latanoprost (XALATAN) 0.005 % ophthalmic solution Place 1 drop into both eyes at bedtime.  01/09/19  Yes [provider]  meclizine (ANTIVERT) 12.5 MG tablet TAKE 1 TABLET BY MOUTH 3 TIMES DAILY AS NEEDED FOR DIZZINESS. Patient taking differently: Take 12.5 mg by mouth in the morning. 08/10/20  Yes Biagio Borg, MD  Multiple Vitamin (MULTIVITAMIN WITH MINERALS) TABS tablet Take 1 tablet by mouth in the morning. Men's One-A-Day Chewable   Yes [provider]  ondansetron (ZOFRAN) 8 MG tablet Take 1 tablet (8 mg total) by mouth 2 (two) times daily as needed (Nausea or vomiting). 07/23/20  Yes Ennever, Rudell Cobb, MD  SYSTANE ULTRA 0.4-0.3 % SOLN Apply 1 drop to eye 3 (three) times daily as needed (dry eyes).  01/09/19  Yes [provider]  traMADol (ULTRAM) 50 MG tablet Take 2 tablets (100 mg total) by mouth at bedtime as needed for moderate pain or severe pain. 04/22/20  Yes Biagio Borg, MD  fluconazole (DIFLUCAN) 100 MG tablet Take 1  tablet (100 mg total) by mouth daily. Patient not taking: Reported on 09/08/2020 08/07/20   Yetta Flock, MD  lidocaine-prilocaine (EMLA) cream Apply 1 application topically as needed. Place on the port one hour before appointment. 08/24/20   Volanda Napoleon, MD     Family History  Problem Relation Age of Onset  . Cancer Mother   . Hypertension Mother   . Diabetes Sister   . Cancer Brother        colon  . Hypertension Brother   . Hyperlipidemia Brother   . Stroke Brother   . Hypertension Sister   . Colon cancer Neg Hx   . Heart attack Neg Hx   . Sudden death Neg Hx   . Esophageal cancer Neg Hx   . Pancreatic cancer Neg Hx   . Stomach cancer Neg Hx   . Rectal cancer Neg Hx     Social History   Socioeconomic History  . Marital status: Married    Spouse name: Not on file  . Number of children: 7  . Years of education: 7  . Highest education level: Not on file  Occupational History  . Occupation: Mining engineer: RETIRED  Tobacco Use  . Smoking status: Former Smoker    Packs/day: 1.00    Years:  24.00    Pack years: 24.00    Types: Cigarettes    Quit date: 10/03/1978    Years since quitting: 41.9  . Smokeless tobacco: Never Used  Vaping Use  . Vaping Use: Never used  Substance and Sexual Activity  . Alcohol use: Not Currently  . Drug use: No  . Sexual activity: Yes    Partners: Female  Other Topics Concern  . Not on file  Social History Narrative   9th grade.  Married '60. [name] 3 sons -'43 '64, '66; 4 dtrs -'60, '62, '61, '65; 15 grands, 25 great-grands.   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   ACD- discussed living will and HCPOA (July '14) provided packet.          Social Determinants of Health   Financial Resource Strain: Not on file  Food Insecurity: Not on file  Transportation Needs: Not on file  Physical Activity: Not on file  Stress: Not on file  Social Connections: Not on file      Review of Systems currently denies  fever, headache, chest pain, dyspnea, abdominal/back pain, nausea, vomiting or bleeding.  Does have occasional cough.  Vital Signs: BP (!) 157/90   Pulse 66   Temp 98.1 F (36.7 C) (Oral)   Resp 11   Ht $R'5\' 8"'dF$  (1.727 m)   Wt 185 lb 13.6 oz (84.3 kg)   SpO2 99%   BMI 28.26 kg/m   Physical Exam awake, alert.  Chest clear to auscultation bilaterally.  Heart with regular rate and rhythm.  Abdomen soft, positive bowel sounds, nontender.  No lower extremity edema.  Imaging: MR 3D Recon At Scanner  Result Date: 09/04/2020 CLINICAL DATA:  New onset jaundice.  Metastatic gastric carcinoma. EXAM: MRI ABDOMEN WITHOUT AND WITH CONTRAST (INCLUDING MRCP) TECHNIQUE: Multiplanar multisequence MR imaging of the abdomen was performed both before and after the administration of intravenous contrast. Heavily T2-weighted images of the biliary and pancreatic ducts were obtained, and three-dimensional MRCP images were rendered by post processing. CONTRAST:  26mL GADAVIST GADOBUTROL 1 MMOL/ML IV SOLN COMPARISON:  CT on 06/12/2020 FINDINGS: Lower chest: No acute findings. Hepatobiliary: No intrahepatic masses are seen, however there are a few sub-cm enhancing soft tissue nodules along the capsular surface of the left hepatic lobe consistent with peritoneal metastases. The gallbladder is distended and diffuse intra and extra hepatic biliary ductal dilatation is now seen, with common bile duct measuring 9 mm. Obstruction of the common bile duct is seen in the porta hepatis, due to an irregular soft tissue mass measuring approximately 4 x 3 cm on image 45/41. This most likely represents metastatic lymphadenopathy. Pancreas: A subtle hypovascular mass is seen in the pancreatic body which measures 2.3 x 1.9 cm on image 47/27. This may represent a pancreatic metastasis or adjacent peripancreatic lymphadenopathy. Spleen:  Within normal limits in size and appearance. Adrenals/Urinary Tract: No masses identified. No evidence of  hydronephrosis. Stomach/Bowel: Prior gastric jejunostomy again noted. Otherwise unremarkable. Vascular/Lymphatic: See above findings in hepatobiliary and pancreas sections. Other: Multiple small enhancing peritoneal and omental soft tissue nodules are again seen in the anterior right and left upper quadrants, consistent with peritoneal metastases. Musculoskeletal:  No suspicious bone lesions identified. IMPRESSION: New diffuse biliary ductal dilatation due to soft tissue mass in the porta hepatis adjacent to pancreatic head. This most likely represents metastatic lymphadenopathy. Small mass in the pancreatic body, most likely representing pancreatic metastasis or adjacent peripancreatic lymphadenopathy. Multiple sub-cm enhancing soft tissue nodules along the  capsular surface of the left hepatic lobe and bilateral upper quadrant omental fat, consistent with peritoneal metastases. Electronically Signed   By: Marlaine Hind M.D.   On: 09/04/2020 21:06   DG ERCP  Result Date: 09/09/2020 CLINICAL DATA:  Attempted ERCP EXAM: ERCP TECHNIQUE: Multiple spot images obtained with the fluoroscopic device and submitted for interpretation post-procedure. FLUOROSCOPY TIME:  Fluoroscopy Time:  3 minutes 38 seconds Number of Acquired Spot Images: 0 COMPARISON:  MRCP 09/04/2020 FINDINGS: A total of 20 intraoperative saved images are submitted for review. The images demonstrate a flexible duodenal scope in various positions within the duodenum and proximal jejunum. No evidence of immediate complication. IMPRESSION: Attempted ERCP. These images were submitted for radiologic interpretation only. Please see the procedural report for the amount of contrast and the fluoroscopy time utilized. Electronically Signed   By: Jacqulynn Cadet M.D.   On: 09/09/2020 14:47   MR ABDOMEN MRCP W WO CONTAST  Result Date: 09/04/2020 CLINICAL DATA:  New onset jaundice.  Metastatic gastric carcinoma. EXAM: MRI ABDOMEN WITHOUT AND WITH CONTRAST  (INCLUDING MRCP) TECHNIQUE: Multiplanar multisequence MR imaging of the abdomen was performed both before and after the administration of intravenous contrast. Heavily T2-weighted images of the biliary and pancreatic ducts were obtained, and three-dimensional MRCP images were rendered by post processing. CONTRAST:  16mL GADAVIST GADOBUTROL 1 MMOL/ML IV SOLN COMPARISON:  CT on 06/12/2020 FINDINGS: Lower chest: No acute findings. Hepatobiliary: No intrahepatic masses are seen, however there are a few sub-cm enhancing soft tissue nodules along the capsular surface of the left hepatic lobe consistent with peritoneal metastases. The gallbladder is distended and diffuse intra and extra hepatic biliary ductal dilatation is now seen, with common bile duct measuring 9 mm. Obstruction of the common bile duct is seen in the porta hepatis, due to an irregular soft tissue mass measuring approximately 4 x 3 cm on image 45/41. This most likely represents metastatic lymphadenopathy. Pancreas: A subtle hypovascular mass is seen in the pancreatic body which measures 2.3 x 1.9 cm on image 47/27. This may represent a pancreatic metastasis or adjacent peripancreatic lymphadenopathy. Spleen:  Within normal limits in size and appearance. Adrenals/Urinary Tract: No masses identified. No evidence of hydronephrosis. Stomach/Bowel: Prior gastric jejunostomy again noted. Otherwise unremarkable. Vascular/Lymphatic: See above findings in hepatobiliary and pancreas sections. Other: Multiple small enhancing peritoneal and omental soft tissue nodules are again seen in the anterior right and left upper quadrants, consistent with peritoneal metastases. Musculoskeletal:  No suspicious bone lesions identified. IMPRESSION: New diffuse biliary ductal dilatation due to soft tissue mass in the porta hepatis adjacent to pancreatic head. This most likely represents metastatic lymphadenopathy. Small mass in the pancreatic body, most likely representing  pancreatic metastasis or adjacent peripancreatic lymphadenopathy. Multiple sub-cm enhancing soft tissue nodules along the capsular surface of the left hepatic lobe and bilateral upper quadrant omental fat, consistent with peritoneal metastases. Electronically Signed   By: Marlaine Hind M.D.   On: 09/04/2020 21:06    Labs:  CBC: Recent Labs    08/05/20 1213 08/14/20 0927 08/20/20 1430 09/04/20 1005  WBC 3.5* 4.0 3.0* 3.7*  HGB 11.7* 10.7* 10.1* 10.9*  HCT 34.8* 33.0* 30.5* 33.4*  PLT 222.0 184 168 201    COAGS: Recent Labs    01/06/20 0915  INR 1.0  APTT 29    BMP: Recent Labs    10/30/19 0900 11/20/19 0900 12/11/19 1000 12/31/19 1056 02/18/20 1025 07/30/20 0840 08/14/20 0927 08/20/20 1430 09/04/20 1005  NA 141 139 141 140   < >  139 138 140 141  K 3.5 4.0 4.0 4.0   < > 4.3 3.9 4.1 3.7  CL 108 104 107 106   < > 105 106 108 109  CO2 $Re'27 30 29 28   'Ogo$ < > $R'27 26 29 26  'Fk$ GLUCOSE 173* 103* 105* 127*   < > 159* 161* 87 106*  BUN $Re'12 18 13 16   'Xay$ < > $R'15 15 12 9  'Bs$ CALCIUM 9.2 9.8 9.4 9.4   < > 9.1 9.3 9.2 9.1  CREATININE 0.76 0.90 0.83 0.87   < > 0.87 0.95 0.76 0.76  GFRNONAA >60 >60 >60 >60   < > >60 >60 >60 >60  GFRAA >60 >60 >60 >60  --   --   --   --   --    < > = values in this interval not displayed.    LIVER FUNCTION TESTS: Recent Labs    07/30/20 0840 08/14/20 0927 08/20/20 1430 09/04/20 1005  BILITOT 0.5 0.4 0.6 5.6*  AST 54* 75* 37 191*  ALT 52* 64* 43 245*  ALKPHOS 151* 183* 139* 400*  PROT 6.6 6.0* 6.0* 6.2*  ALBUMIN 3.8 3.7 3.6 3.7    TUMOR MARKERS: No results for input(s): AFPTM, CEA, CA199, CHROMGRNA in the last 8760 hours.  Assessment and Plan: 80 y.o. male with past medical history significant for acoustic neuroma, anemia, arthritis, BPH, diabetes, glaucoma, hypertension, hyperlipidemia, and gastric cancer with prior gastrectomy and Billroth II anatomy.  Patient had recent MRCP that showed diffuse biliary ductal dilatation with a soft tissue mass in  porta hepatis, likely representing metastatic lymphadenopathy.  Also noted was a small mass in the pancreatic body along with multiple subcentimeter enhancing soft tissue nodules along the capsular surface of the left hepatic lobe and bilateral upper quadrant omental fat.  He underwent failed OP ERCP today.  Last bilirubin from 6/3 was 5.6.  Request now received from GI service for PTC with biliary drain placement.  Imaging studies have been reviewed by Dr. Pascal Lux.  Details/risks of procedure, including but not limited to, internal bleeding, infection, injury to adjacent structures, need for long-term biliary drainage discussed with patient and spouse with their understanding and consent.  Procedure tentatively scheduled for later this afternoon or tomorrow.   Thank you for this interesting consult.  I greatly enjoyed meeting Jonathan Lewis and look forward to participating in their care.  A copy of this report was sent to the requesting provider on this date.  Electronically Signed: D. Rowe Robert, PA-C 09/09/2020, 2:56 PM   I spent a total of 25 minutes in face to face in clinical consultation, greater than 50% of which was counseling/coordinating care for percutaneous transhepatic cholangiogram with biliary drain placement

## 2020-09-09 NOTE — Anesthesia Procedure Notes (Signed)
Procedure Name: Intubation Date/Time: 09/09/2020 11:43 AM Performed by: Mitzie Na, CRNA Pre-anesthesia Checklist: Patient identified, Emergency Drugs available, Suction available and Patient being monitored Patient Re-evaluated:Patient Re-evaluated prior to induction Oxygen Delivery Method: Circle system utilized Preoxygenation: Pre-oxygenation with 100% oxygen Induction Type: IV induction Ventilation: Mask ventilation without difficulty Laryngoscope Size: Mac and 4 Grade View: Grade I Tube type: Oral Tube size: 7.5 mm Number of attempts: 1 Airway Equipment and Method: Stylet and Oral airway Placement Confirmation: ETT inserted through vocal cords under direct vision,  positive ETCO2 and breath sounds checked- equal and bilateral Secured at: 22 cm Tube secured with: Tape Dental Injury: Teeth and Oropharynx as per pre-operative assessment

## 2020-09-09 NOTE — H&P (Signed)
GASTROENTEROLOGY PROCEDURE H&P NOTE   Primary Care Physician: Biagio Borg, MD  HPI: Jonathan Lewis is a 80 y.o. male who presents for ERCP attempt at biliary obstruction treatment in setting of previous gastrectomy and Billroth 2 anatomy.  Past Medical History:  Diagnosis Date  . Acoustic neuroma ([place]) 06/25/2015  . Allergic rhinitis 07/18/2014  . Allergy   . Anemia   . Arthritis   . BPH (benign prostatic hyperplasia)   . Cancer ([place])    stage III stomach-Dx 03/2019  . Carbuncle    recurrent MRSA carbuncles  . Cataract    Per pt bilateral cataracts removed.  . Diabetes mellitus    Type II. Per pt Dr. Jenny Reichmann took him off his dm med.  . Disk prolapse   . Glaucoma   . Hyperlipidemia   . Hypertension   . Male hypogonadism 07/16/2014  . Neuropathy   . Pneumonia   . Sinus bradycardia    chronic, asymptomatic  . Sinusitis 07/29/2012   Past Surgical History:  Procedure Laterality Date  . ANTERIOR CERVICAL DECOMPRESSION/DISCECTOMY FUSION 4 LEVELS N/A 01/08/2020   Procedure: ANTERIOR CERVICAL DECOMPRESSION FUSION CERVICAL 3-4, CERVICAL 4-5, CERVIAL 5-6 WITH INSTRUMENTATION AND ALLOGRAFT;  Surgeon: Phylliss Bob, MD;  Location: [place];  Service: Orthopedics;  Laterality: N/A;  . BALLOON DILATION N/A 03/12/2019   Procedure: BALLOON DILATION;  Surgeon: Rush Landmark Telford Nab., MD;  Location: [facility];  Service: Gastroenterology;  Laterality: N/A;  pyloric  . BIOPSY  03/08/2019   Procedure: BIOPSY;  Surgeon: Yetta Flock, MD;  Location: [facility];  Service: Gastroenterology;;  . BIOPSY  03/12/2019   Procedure: BIOPSY;  Surgeon: Irving Copas., MD;  Location: [facility];  Service: Gastroenterology;;  . CATARACT EXTRACTION     x 2  . COLONOSCOPY    . ESOPHAGOGASTRODUODENOSCOPY N/A 03/08/2019   Procedure: ESOPHAGOGASTRODUODENOSCOPY (EGD);  Surgeon: Yetta Flock, MD;  Location: [facility];  Service: Gastroenterology;  Laterality: N/A;  .  ESOPHAGOGASTRODUODENOSCOPY (EGD) WITH PROPOFOL N/A 03/12/2019   Procedure: ESOPHAGOGASTRODUODENOSCOPY (EGD) WITH PROPOFOL;  Surgeon: Rush Landmark Telford Nab., MD;  Location: [facility];  Service: Gastroenterology;  Laterality: N/A;  . FINE NEEDLE ASPIRATION  03/12/2019   Procedure: FINE NEEDLE ASPIRATION (FNA) LINEAR;  Surgeon: Irving Copas., MD;  Location: [facility];  Service: Gastroenterology;;  . FOREIGN BODY REMOVAL  03/08/2019   Procedure: FOREIGN BODY REMOVAL;  Surgeon: Yetta Flock, MD;  Location: [facility];  Service: Gastroenterology;;  . IR PATIENT EVAL TECH 0-60 MINS  04/03/2019  . LAPAROTOMY N/A 03/19/2019   Procedure: EXPLORATORY LAPAROTOMY, distal GASTRECTOMY AND PLACEMENT OF G AND J TUBE, gastric jejunostomy;  Surgeon: Greer Pickerel, MD;  Location: [facility];  Service: General;  Laterality: N/A;  . POLYPECTOMY    . PORTACATH PLACEMENT N/A 03/26/2019   Procedure: INSERTION PORT-A-CATH;  Surgeon: Michael Boston, MD;  Location: [facility];  Service: General;  Laterality: N/A;  . STOMACH SURGERY    . UPPER ESOPHAGEAL ENDOSCOPIC ULTRASOUND (EUS) N/A 03/12/2019   Procedure: UPPER ESOPHAGEAL ENDOSCOPIC ULTRASOUND (EUS);  Surgeon: Irving Copas., MD;  Location: [facility];  Service: Gastroenterology;  Laterality: N/A;  . UPPER GASTROINTESTINAL ENDOSCOPY    . VIDEO BRONCHOSCOPY WITH ENDOBRONCHIAL ULTRASOUND N/A 05/22/2019   Procedure: VIDEO BRONCHOSCOPY WITH ENDOBRONCHIAL ULTRASOUND;  Surgeon: Garner Nash, DO;  Location: [facility];  Service: Thoracic;  Laterality: N/A;   Current Facility-Administered Medications  Medication Dose Route Frequency Provider Last Rate Last Admin  . 0.9 %  sodium  chloride infusion   Intravenous Continuous Mansouraty, Telford Nab., MD      . lactated ringers infusion   Intravenous Continuous Mansouraty, Telford Nab., MD       No Known Allergies Family History  Problem Relation Age of Onset  . Cancer Mother   . Hypertension Mother   .  Diabetes Sister   . Cancer Brother        colon  . Hypertension Brother   . Hyperlipidemia Brother   . Stroke Brother   . Hypertension Sister   . Colon cancer Neg Hx   . Heart attack Neg Hx   . Sudden death Neg Hx   . Esophageal cancer Neg Hx   . Pancreatic cancer Neg Hx   . Stomach cancer Neg Hx   . Rectal cancer Neg Hx    Social History   Socioeconomic History  . Marital status: Married    Spouse name: Not on file  . Number of children: 7  . Years of education: 7  . Highest education level: Not on file  Occupational History  . Occupation: Mining engineer: RETIRED  Tobacco Use  . Smoking status: Former Smoker    Packs/day: 1.00    Years: 24.00    Pack years: 24.00    Types: Cigarettes    Quit date: 10/03/1978    Years since quitting: 41.9  . Smokeless tobacco: Never Used  Vaping Use  . Vaping Use: Never used  Substance and Sexual Activity  . Alcohol use: Not Currently  . Drug use: No  . Sexual activity: Yes    Partners: Female  Other Topics Concern  . Not on file  Social History Narrative   9th grade.  Married '60. [name] 3 sons -'44 '64, '66; 4 dtrs -'60, '62, '61, '65; 15 grands, 25 great-grands.   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   ACD- discussed living will and HCPOA (July '14) provided packet.          Social Determinants of Health   Financial Resource Strain: Not on file  Food Insecurity: Not on file  Transportation Needs: Not on file  Physical Activity: Not on file  Stress: Not on file  Social Connections: Not on file  Intimate Partner Violence: Not on file    Physical Exam: Vital signs in last 24 hours: Temp:  [98 F (36.7 C)] 98 F (36.7 C) (06/08 1009) Pulse Rate:  [63] 63 (06/08 1009) Resp:  [13] 13 (06/08 1009) BP: (161)/(81) 161/81 (06/08 1009) SpO2:  [100 %] 100 % (06/08 1009) Weight:  [84.3 kg] 84.3 kg (06/08 1009)   GEN: NAD EYE: Sclerae anicteric ENT: MMM CV: Non-tachycardic GI: Soft, NT/ND NEURO:  Alert  & Oriented x 3  Lab Results: No results for input(s): WBC, HGB, HCT, PLT in the last 72 hours. BMET No results for input(s): NA, K, CL, CO2, GLUCOSE, BUN, CREATININE, CALCIUM in the last 72 hours. LFT No results for input(s): PROT, ALBUMIN, AST, ALT, ALKPHOS, BILITOT, BILIDIR, IBILI in the last 72 hours. PT/INR No results for input(s): LABPROT, INR in the last 72 hours.   Impression / Plan: This is a 80 y.o.male who presents for ERCP attempt at biliary obstruction treatment in setting of previous gastrectomy and Billroth 2 anatomy.  The risks of an ERCP were discussed at length, including but not limited to the risk of perforation, bleeding, abdominal pain, post-ERCP pancreatitis (while usually mild can be severe and even life threatening).  The  risks and benefits of endoscopic evaluation were discussed with the patient; these include but are not limited to the risk of perforation, infection, bleeding, missed lesions, lack of diagnosis, severe illness requiring hospitalization, as well as anesthesia and sedation related illnesses.  The patient is agreeable to proceed.   If we are not successful at cannulation he will need a PBD placement by Interventional Radiology for which he will need to come into the hospital.  He and wife are aware after extensive discussions this AM.   Justice Britain, MD [facility] Gastroenterology Advanced Endoscopy Office # [phone]

## 2020-09-09 NOTE — Anesthesia Postprocedure Evaluation (Addendum)
Anesthesia Post Note  Patient: Jonathan Lewis  Procedure(s) Performed: ENDOSCOPIC RETROGRADE CHOLANGIOPANCREATOGRAPHY (ERCP) WITH PROPOFOL (N/A ) SUBMUCOSAL TATTOO INJECTION     Patient location during evaluation: Endoscopy Anesthesia Type: MAC Level of consciousness: awake and alert Pain management: pain level controlled Vital Signs Assessment: post-procedure vital signs reviewed and stable Respiratory status: spontaneous breathing, nonlabored ventilation and respiratory function stable Cardiovascular status: blood pressure returned to baseline and stable Postop Assessment: no apparent nausea or vomiting Anesthetic complications: no   No complications documented.  Last Vitals:  Vitals:   09/09/20 1420 09/09/20 1421  BP:  (!) 157/90  Pulse: 67 66  Resp: 10 11  Temp:    SpO2: 99% 99%    Last Pain:  Vitals:   09/09/20 1421  TempSrc:   PainSc: 0-No pain                 Merlinda Frederick

## 2020-09-09 NOTE — Procedures (Signed)
Pre procedural Dx: Obstructive Juandice Post procedural Dx: Same  Attempted though ultimately unsuccessful placement of a percutaneous biliary drain secondary to lack of any significant peripheral intrahepatic biliary ductal dilatation and poor sonographic window d/t high riding liver and relatively small size of the right lobe.   EBL: Minimal Complications: None immediate  PLAN:  - Recommend keeping pt NPO after midnight and obtaining a CMP in the AM prior to discussion regarding repeat attempt at percutaneous biliary drain placement.   Ronny Bacon, MD Pager #: [phone]

## 2020-09-09 NOTE — Op Note (Signed)
[facility] Patient Name: Jonathan Lewis Procedure Date: 09/09/2020 MRN: [id number] Attending MD: Justice Britain , MD Date of Birth: May 29, 1940 CSN: [id number] Age: 80 Admit Type: Outpatient Procedure:                ERCP Indications:              Common bile duct stricture, Abnormal MRCP,                            Jaundice, Elevated aspartate transaminase (AST) Providers:                Justice Britain, MD, Benay Pillow, RN, Elspeth Cho Tech., Technician, Caryl Pina CRNA Referring MD:             Rudell Cobb. Marin Olp MD, MD, Carlota Raspberry. Havery Moros, MD,                            Leighton Ruff. Redmond Pulling MD, MD, Biagio Borg, MD Medicines:                General Anesthesia, Cipro 400 mg IV, Indomethacin                            366 mg PR Complications:            No immediate complications. Estimated Blood Loss:     Estimated blood loss was minimal. Procedure:                Pre-Anesthesia Assessment:                           - Prior to the procedure, a History and Physical                            was performed, and patient medications and                            allergies were reviewed. The patient's tolerance of                            previous anesthesia was also reviewed. The risks                            and benefits of the procedure and the sedation                            options and risks were discussed with the patient.                            All questions were answered, and informed consent                            was obtained. Prior Anticoagulants: The patient has  taken no previous anticoagulant or antiplatelet                            agents. ASA Grade Assessment: III - A patient with                            severe systemic disease. After reviewing the risks                            and benefits, the patient was deemed in                            satisfactory condition  to undergo the procedure.                           After obtaining informed consent, the scope was                            passed under direct vision. Throughout the                            procedure, the patient's blood pressure, pulse, and                            oxygen saturations were monitored continuously. The                            [id number]-[id number] ([id number]) Olympus therapeutic endoscope                            was introduced through the mouth, and used to                            inject contrast into without successful                            cannulation. The [id number]-[id number] ([id number]) Olympus                            pediatric colonscope was introduced through the                            mouth, and used to inject contrast into and used to                            locate the major papilla. The Olympus [id number]                            [id number]) was introduced through the mouth, and                            used to inject contrast into without successful  cannulation. The ERCP was technically difficult and                            complex due to post-surgical anatomy and                            challenging cannulation. Successful completion of                            the procedure was aided by performing the maneuvers                            documented (below) in this report. The patient                            tolerated the procedure. Scope In: Scope Out: Findings:      A scout film of the abdomen was obtained. Surgical clips were seen in       the area of the right upper quadrant of the abdomen.      The upper GI tract was traversed under direct vision without detailed       examination. No gross lesions were noted in the entire esophagus. The       Z-line was regular and was found 40 cm from the incisors. A small hiatal       hernia was present. No gross lesions were noted in the entire examined       stomach other  than evidence of a gastrojejunostomy which was found in       the g astric body. This was characterized by congestion. The examined       efferent jejunum was normal. The examined afferent jejunum was normal       but significantly angulated. The therapeutic EGD scope did not reach the       end of the afferent limb. The pediatric colonoscope was able to reach       the end of the afferent limb. Using this scope, there was evidence of a       previous surgical anastomosis in the first portion of the duodenum       suggestive of the previous stapling and cutoff after Billroth 2. This       was characterized by healthy appearing mucosa. The ampulla was found.       The major papilla was edematous.      I was at approximately 120 cm from the incisors with the pediatric       colonoscope in short position. The bile duct could not be cannulated       with the tapered sphincterotome and Billroth II sphincterotome using the       pediatric colonoscope. I decided to attempt reaching this area with the       Duodenoscope. Using the pediatric colonoscope, the afferent limb was       injected with Spot for marking purposes to allow easier access to try       and reach with the side-viewing endoscope. I replaced the colonoscope       with the duodenoscope but due to angulation and overall length, could       not reach the ampullary orifice.      After greater than 90   minutes of attempting cannulation, decision made       to end procedure. The duodenoscope was withdrawn from the patient. Impression:               - No gross lesions in esophagus. Z-line regular, 40                            cm from the incisors.                           - Small hiatal hernia.                           - No gross lesions in the stomach other than a                            gastrojejunostomy was found, characterized by                            congestion.                           - Normal examined efferent  jejunum.                           - Normal examined afferent jejunum (but only full                            evaluated with pedatric colonoscope at 120 cm from                            incisors). Afferent limb tattooed to help with                            demarcation.                           - The major papilla appeared edematous.                           - Cannulation of the biliary tree not successful                            with pediatric colonoscope using Billroth                            sphincterotome and tapered sphincterotome.                           - Attempt at getting Duodenoscope to the ampullary                            orifice was not possible due to angulation and                            overall length of scope required to reach the area.  Moderate Sedation:      Not Applicable - Patient had care per Anesthesia. Recommendation:           - The patient will be observed post-procedure,                            until all discharge criteria are met.                           - Watch for pancreatitis, bleeding, perforation,                            and cholangitis.                           - Admit the patient to hospital ward for IR                            evaluation and PBD placement (have discussed with                            IR today and possible PBD later today vs tomorrow                            if schedule allows).                           - Appreciate Medical service for admission.                           - Will update Oncology team as well.                           - [facility] [facility] team will evaluate patient                            in follow up tomorrow.                           - Wife updated in great detail today. Will try to                            reach patient's family (though all at a funeral                            today).                           - The findings and recommendations were discussed                             with the patient. Procedure Code(s):        --- Professional ---                           43235, Esophagogastroduodenoscopy, flexible,                              transoral; diagnostic, including collection of                            specimen(s) by brushing or washing, when performed                            (separate procedure) Diagnosis Code(s):        --- Professional ---                           K44.9, Diaphragmatic hernia without obstruction or                            gangrene                           Z98.0, Intestinal bypass and anastomosis status                           K83.1, Obstruction of bile duct                           R17, Unspecified jaundice                           R74.01, Elevation of levels of liver transaminase                            levels                           K83.8, Other specified diseases of biliary tract                           R93.2, Abnormal findings on diagnostic imaging of                            liver and biliary tract CPT copyright 2019 American Medical Association. All rights reserved. The codes documented in this report are preliminary and upon coder review may  be revised to meet current compliance requirements. Gabriel Mansouraty, MD 09/09/2020 2:11:03 PM Number of Addenda: 0 

## 2020-09-09 NOTE — Transfer of Care (Signed)
Immediate Anesthesia Transfer of Care Note  Patient: Jonathan Lewis  Procedure(s) Performed: ENDOSCOPIC RETROGRADE CHOLANGIOPANCREATOGRAPHY (ERCP) WITH PROPOFOL (N/A ) SUBMUCOSAL TATTOO INJECTION  Patient Location: Endoscopy Unit  Anesthesia Type:General  Level of Consciousness: awake, alert , oriented and patient cooperative  Airway & Oxygen Therapy: Patient Spontanous Breathing and Patient connected to face mask oxygen  Post-op Assessment: Report given to RN, Post -op Vital signs reviewed and stable and Patient moving all extremities  Post vital signs: Reviewed and stable  Last Vitals:  Vitals Value Taken Time  BP 151/97 09/09/20 1341  Temp 36.7 C 09/09/20 1341  Pulse 75 09/09/20 1344  Resp 15 09/09/20 1344  SpO2 99 % 09/09/20 1344  Vitals shown include unvalidated device data.  Last Pain:  Vitals:   09/09/20 1341  TempSrc: Oral  PainSc: 0-No pain         Complications: No complications documented.

## 2020-09-09 NOTE — Progress Notes (Signed)
Pt resting comfortably in recovery bay 1 with wife in chair at bedside.  Waiting for room assignment.

## 2020-09-09 NOTE — Anesthesia Preprocedure Evaluation (Addendum)
Anesthesia Evaluation  Patient identified by MRN, date of birth, ID band Patient awake    Reviewed: Allergy & Precautions, NPO status , Patient's Chart, lab work & pertinent test results  Airway Mallampati: II  TM Distance: >3 FB Neck ROM: Full    Dental  (+) Chipped,    Pulmonary former smoker,    Pulmonary exam normal breath sounds clear to auscultation       Cardiovascular hypertension, Normal cardiovascular exam Rhythm:Regular Rate:Normal     Neuro/Psych  Neuromuscular disease negative psych ROS   GI/Hepatic Neg liver ROS, GERD  Medicated and Controlled,  Endo/Other  diabetes  Renal/GU negative Renal ROS     Musculoskeletal  (+) Arthritis ,   Abdominal   Peds  Hematology  (+) anemia , HLD   Anesthesia Other Findings Gastric adenocarcinoma  Reproductive/Obstetrics                            Anesthesia Physical Anesthesia Plan  ASA: III  Anesthesia Plan: General   Post-op Pain Management:    Induction: Intravenous  PONV Risk Score and Plan: 3 and Ondansetron, Dexamethasone and Treatment may vary due to age or medical condition  Airway Management Planned: Oral ETT  Additional Equipment:   Intra-op Plan:   Post-operative Plan: Extubation in OR  Informed Consent: I have reviewed the patients History and Physical, chart, labs and discussed the procedure including the risks, benefits and alternatives for the proposed anesthesia with the patient or authorized representative who has indicated his/her understanding and acceptance.     Dental advisory given  Plan Discussed with: CRNA  Anesthesia Plan Comments:        Anesthesia Quick Evaluation

## 2020-09-10 ENCOUNTER — Observation Stay (HOSPITAL_COMMUNITY): Payer: Medicare PPO

## 2020-09-10 ENCOUNTER — Other Ambulatory Visit: Payer: Self-pay | Admitting: Hematology & Oncology

## 2020-09-10 DIAGNOSIS — Z85028 Personal history of other malignant neoplasm of stomach: Secondary | ICD-10-CM | POA: Diagnosis not present

## 2020-09-10 DIAGNOSIS — C169 Malignant neoplasm of stomach, unspecified: Secondary | ICD-10-CM | POA: Diagnosis not present

## 2020-09-10 DIAGNOSIS — C162 Malignant neoplasm of body of stomach: Secondary | ICD-10-CM | POA: Diagnosis not present

## 2020-09-10 DIAGNOSIS — K831 Obstruction of bile duct: Secondary | ICD-10-CM | POA: Diagnosis not present

## 2020-09-10 DIAGNOSIS — N200 Calculus of kidney: Secondary | ICD-10-CM | POA: Diagnosis not present

## 2020-09-10 DIAGNOSIS — K7689 Other specified diseases of liver: Secondary | ICD-10-CM | POA: Diagnosis not present

## 2020-09-10 DIAGNOSIS — K828 Other specified diseases of gallbladder: Secondary | ICD-10-CM | POA: Diagnosis not present

## 2020-09-10 HISTORY — PX: IR INT EXT BILIARY DRAIN WITH CHOLANGIOGRAM: IMG6044

## 2020-09-10 LAB — CBC
HCT: 32.2 % — ABNORMAL LOW (ref 39.0–52.0)
Hemoglobin: 10.6 g/dL — ABNORMAL LOW (ref 13.0–17.0)
MCH: 29 pg (ref 26.0–34.0)
MCHC: 32.9 g/dL (ref 30.0–36.0)
MCV: 88.2 fL (ref 80.0–100.0)
Platelets: 180 10*3/uL (ref 150–400)
RBC: 3.65 MIL/uL — ABNORMAL LOW (ref 4.22–5.81)
RDW: 15.1 % (ref 11.5–15.5)
WBC: 4.7 10*3/uL (ref 4.0–10.5)
nRBC: 0 % (ref 0.0–0.2)

## 2020-09-10 LAB — COMPREHENSIVE METABOLIC PANEL
ALT: 249 U/L — ABNORMAL HIGH (ref 0–44)
ALT: 253 U/L — ABNORMAL HIGH (ref 0–44)
AST: 213 U/L — ABNORMAL HIGH (ref 15–41)
AST: 214 U/L — ABNORMAL HIGH (ref 15–41)
Albumin: 2.9 g/dL — ABNORMAL LOW (ref 3.5–5.0)
Albumin: 3 g/dL — ABNORMAL LOW (ref 3.5–5.0)
Alkaline Phosphatase: 383 U/L — ABNORMAL HIGH (ref 38–126)
Alkaline Phosphatase: 393 U/L — ABNORMAL HIGH (ref 38–126)
Anion gap: 4 — ABNORMAL LOW (ref 5–15)
Anion gap: 4 — ABNORMAL LOW (ref 5–15)
BUN: 10 mg/dL (ref 8–23)
BUN: 10 mg/dL (ref 8–23)
CO2: 29 mmol/L (ref 22–32)
CO2: 29 mmol/L (ref 22–32)
Calcium: 8.8 mg/dL — ABNORMAL LOW (ref 8.9–10.3)
Calcium: 8.8 mg/dL — ABNORMAL LOW (ref 8.9–10.3)
Chloride: 107 mmol/L (ref 98–111)
Chloride: 108 mmol/L (ref 98–111)
Creatinine, Ser: 0.52 mg/dL — ABNORMAL LOW (ref 0.61–1.24)
Creatinine, Ser: 0.68 mg/dL (ref 0.61–1.24)
GFR, Estimated: >60 mL/min (ref >60)
GFR, Estimated: >60 mL/min (ref >60)
Glucose, Bld: 124 mg/dL — ABNORMAL HIGH (ref 70–99)
Glucose, Bld: 126 mg/dL — ABNORMAL HIGH (ref 70–99)
Potassium: 3.5 mmol/L (ref 3.5–5.1)
Potassium: 3.5 mmol/L (ref 3.5–5.1)
Sodium: 140 mmol/L (ref 135–145)
Sodium: 141 mmol/L (ref 135–145)
Total Bilirubin: 10.1 mg/dL — ABNORMAL HIGH (ref 0.3–1.2)
Total Bilirubin: 9.7 mg/dL — ABNORMAL HIGH (ref 0.3–1.2)
Total Protein: 6.1 g/dL — ABNORMAL LOW (ref 6.5–8.1)
Total Protein: 6.1 g/dL — ABNORMAL LOW (ref 6.5–8.1)

## 2020-09-10 LAB — SARS CORONAVIRUS 2 (TAT 6-24 HRS): SARS Coronavirus 2: NEGATIVE

## 2020-09-10 MED ORDER — LIDOCAINE-EPINEPHRINE (PF) 1 %-1:200000 IJ SOLN
INTRAMUSCULAR | Status: AC | PRN
  Administered 2020-09-10: 10 mL

## 2020-09-10 MED ORDER — SODIUM CHLORIDE 0.9 % IV SOLN
2.0000 g | Freq: Once | INTRAVENOUS | Status: AC
  Administered 2020-09-10: 2 g via INTRAVENOUS
  Filled 2020-09-10: qty 2

## 2020-09-10 MED ORDER — SODIUM CHLORIDE (PF) 0.9 % IJ SOLN
INTRAMUSCULAR | Status: AC
  Filled 2020-09-10: qty 50

## 2020-09-10 MED ORDER — SODIUM CHLORIDE 0.9% FLUSH
5.0000 mL | Freq: Three times a day (TID) | INTRAVENOUS | Status: DC
  Administered 2020-09-10 – 2020-09-12 (×5): 5 mL

## 2020-09-10 MED ORDER — FENTANYL CITRATE (PF) 100 MCG/2ML IJ SOLN
INTRAMUSCULAR | Status: AC
  Filled 2020-09-10: qty 2

## 2020-09-10 MED ORDER — FENTANYL CITRATE (PF) 100 MCG/2ML IJ SOLN
INTRAMUSCULAR | Status: AC | PRN
  Administered 2020-09-10 (×2): 50 ug via INTRAVENOUS

## 2020-09-10 MED ORDER — MIDAZOLAM HCL 2 MG/2ML IJ SOLN
INTRAMUSCULAR | Status: AC
  Filled 2020-09-10: qty 4

## 2020-09-10 MED ORDER — LIDOCAINE HCL 1 % IJ SOLN
INTRAMUSCULAR | Status: AC
  Filled 2020-09-10: qty 20

## 2020-09-10 MED ORDER — MIDAZOLAM HCL 2 MG/2ML IJ SOLN
INTRAMUSCULAR | Status: AC | PRN
  Administered 2020-09-10 (×2): 1 mg via INTRAVENOUS

## 2020-09-10 MED ORDER — IOHEXOL 300 MG/ML  SOLN
15.0000 mL | Freq: Once | INTRAMUSCULAR | Status: AC | PRN
  Administered 2020-09-10: 15 mL

## 2020-09-10 MED ORDER — IOHEXOL 300 MG/ML  SOLN
100.0000 mL | Freq: Once | INTRAMUSCULAR | Status: AC | PRN
  Administered 2020-09-10: 100 mL via INTRAVENOUS

## 2020-09-10 MED ORDER — CHLORHEXIDINE GLUCONATE CLOTH 2 % EX PADS
6.0000 | MEDICATED_PAD | Freq: Every day | CUTANEOUS | Status: DC
  Administered 2020-09-10 – 2020-09-12 (×3): 6 via TOPICAL

## 2020-09-10 NOTE — Progress Notes (Signed)
PROGRESS NOTE    Jonathan Lewis  OYD:[id number] DOB: Aug 05, 1940 DOA: 09/09/2020 PCP: Biagio Borg, MD  Brief Narrative: 80 year old male with history of metastatic gastric cancer, on chemotherapy followed by Dr. Marin Olp, was recently noted to have abnormal LFTs, and further work-up with MRCP was noted to have diffuse biliary ductal dilation and soft tissue mass, he was subsequently set up to see gastroenterology and underwent an ERCP on 6/8 to attempt biliary stent placement, this was attempted and unsuccessful, subsequently IR was consulted for presumed percutaneous biliary drain placement, this was also attempted 6/8 evening and unsuccessful, admitted overnight for observation and repeat attempts.   Subjective -Patient denies any complaints, denies abdominal pain nausea vomiting or fevers   Assessment & Plan:   Biliary obstruction     -MRCP noted diffuse biliary ductal dilation due to soft tissue mass in the porta hepatis adjacent to pancreatic head likely represents metastatic lymphadenopathy in addition has suspected pancreatic mass and peritoneal metastasis as well  -Gastroenterology following, and underwent an unsuccessful ERCP attempt yesterday -IR consulted and following, failed PERC biliary drain placement yesterday, IR plans to reattempt this today -LFTs are relatively stable, bilirubin around 10  Metastatic gastric cancer -Followed by Dr. Marin Olp, recent MRCP suggests disease progression and new metastasis -Chemotherapy on hold -I would imagine his prognosis is guarded   Chronic iron deficiency anemia     -Hemoglobin is stable   GERD     - protonix   HLD     - continue statin   [place]     - continue home regimen   Hx of DM2     - diet controlled     - last A1c was 6.6 on 04/22/20   DVT prophylaxis: SCDs  Code Status: FULL  Family Communication: Discussed with patient in detail, no family at bedside Disposition Plan:  Status is: Observation  The patient  will require care spanning > 2 midnights and should be moved to inpatient because: Inpatient level of care appropriate due to severity of illness  Dispo: The patient is from: Home              Anticipated d/c is to: Home              Patient currently is not medically stable to d/c.   Difficult to place patient No        Consultants:  Gastroenterology, IR  Procedures:   Antimicrobials:    Subjective:   Objective: Vitals:   09/09/20 1833 09/09/20 2207 09/10/20 0207 09/10/20 0601  BP: (!) 170/88 (!) 148/85 119/76 135/78  Pulse: (!) 51 (!) 53 (!) 58 (!) 53  Resp: 16 17 17 17   Temp: 97.8 F (36.6 C) 98.2 F (36.8 C) 97.9 F (36.6 C) 98.4 F (36.9 C)  TempSrc: Oral Oral Oral Oral  SpO2: 100% 100% 100% 100%  Weight:      Height:        Intake/Output Summary (Last 24 hours) at 09/10/2020 1102 Last data filed at 09/10/2020 1000 Gross per 24 hour  Intake 1886.37 ml  Output 1625 ml  Net 261.37 ml   Filed Weights   09/09/20 1009  Weight: 84.3 kg    Examination:  General exam: Elderly pleasant male, sitting up in bed awake alert oriented x3 HEENT: Port-A-Cath noted CVS: S1-S2, regular rate rhythm Lungs: Clear bilaterally Abdomen: Soft, nontender, bowel sounds present, hepatomegaly noted Extremities: No edema Skin: No rashes Psychiatry: Judgement and insight appear normal. Mood & affect  appropriate.     Data Reviewed:   CBC: Recent Labs  Lab 09/04/20 1005 09/10/20 0457  WBC 3.7* 4.7  NEUTROABS 1.7  --   HGB 10.9* 10.6*  HCT 33.4* 32.2*  MCV 87.2 88.2  PLT 201 063   Basic Metabolic Panel: Recent Labs  Lab 09/04/20 1005 09/09/20 1900 09/10/20 0457  NA 141 141 140  141  K 3.7 3.4* 3.5  3.5  CL 109 107 107  108  CO2 26 26 29  29   GLUCOSE 106* 114* 124*  126*  BUN 9 11 10  10   CREATININE 0.76 0.80 0.68  0.52*  CALCIUM 9.1 9.2 8.8*  8.8*   GFR: Estimated Creatinine Clearance: 76.6 mL/min (by C-G formula based on SCr of 0.68  mg/dL). Liver Function Tests: Recent Labs  Lab 09/04/20 1005 09/09/20 1900 09/10/20 0457  AST 191* 271* 214*  213*  ALT 245* 300* 253*  249*  ALKPHOS 400* 467* 383*  393*  BILITOT 5.6* 10.6* 9.7*  10.1*  PROT 6.2* 6.9 6.1*  6.1*  ALBUMIN 3.7 3.4* 2.9*  3.0*   No results for input(s): LIPASE, AMYLASE in the last 168 hours. No results for input(s): AMMONIA in the last 168 hours. Coagulation Profile: Recent Labs  Lab 09/09/20 1900  INR 1.0   Cardiac Enzymes: No results for input(s): CKTOTAL, CKMB, CKMBINDEX, TROPONINI in the last 168 hours. BNP (last 3 results) No results for input(s): PROBNP in the last 8760 hours. HbA1C: No results for input(s): HGBA1C in the last 72 hours. CBG: No results for input(s): GLUCAP in the last 168 hours. Lipid Profile: No results for input(s): CHOL, HDL, LDLCALC, TRIG, CHOLHDL, LDLDIRECT in the last 72 hours. Thyroid Function Tests: No results for input(s): TSH, T4TOTAL, FREET4, T3FREE, THYROIDAB in the last 72 hours. Anemia Panel: No results for input(s): VITAMINB12, FOLATE, FERRITIN, TIBC, IRON, RETICCTPCT in the last 72 hours. Urine analysis:    Component Value Date/Time   COLORURINE YELLOW 04/22/2020 [place] 04/22/2020 1246   LABSPEC 1.025 04/22/2020 1246   PHURINE 5.5 04/22/2020 1246   GLUCOSEU NEGATIVE 04/22/2020 1246   HGBUR NEGATIVE 04/22/2020 1246   HGBUR negative 11/30/2009 1450   BILIRUBINUR NEGATIVE 04/22/2020 [place] 04/22/2020 1246   PROTEINUR NEGATIVE 09/04/2020 1006   UROBILINOGEN 0.2 04/22/2020 1246   NITRITE NEGATIVE 04/22/2020 1246   LEUKOCYTESUR NEGATIVE 04/22/2020 1246   Sepsis Labs: @LABRCNTIP (procalcitonin:4,lacticidven:4)  )No results found for this or any previous visit (from the past 240 hour(s)).       Radiology Studies: DG ERCP  Result Date: 09/09/2020 CLINICAL DATA:  Attempted ERCP EXAM: ERCP TECHNIQUE: Multiple spot images obtained with the fluoroscopic  device and submitted for interpretation post-procedure. FLUOROSCOPY TIME:  Fluoroscopy Time:  3 minutes 38 seconds Number of Acquired Spot Images: 0 COMPARISON:  MRCP 09/04/2020 FINDINGS: A total of 20 intraoperative saved images are submitted for review. The images demonstrate a flexible duodenal scope in various positions within the duodenum and proximal jejunum. No evidence of immediate complication. IMPRESSION: Attempted ERCP. These images were submitted for radiologic interpretation only. Please see the procedural report for the amount of contrast and the fluoroscopy time utilized. Electronically Signed   By: Jacqulynn Cadet M.D.   On: 09/09/2020 14:47   IR BILIARY DRAIN PLACEMENT WITH CHOLANGIOGRAM  Result Date: 09/10/2020 INDICATION: History of gastric cancer, now with obstructive jaundice, post failed attempt at ERCP biliary stent placement. EXAM: ATTEMPTED THOUGH ULTIMATELY UNSUCCESSFUL ULTRASOUND AND FLUOROSCOPIC GUIDED PERCUTANEOUS  TRANSHEPATIC CHOLANGIOGRAM AND BILIARY TUBE PLACEMENT COMPARISON:  MRCP-09/04/2020; PET-CT-06/26/2018 MEDICATIONS: Cefoxitin 2 gm IV; The antibiotic was administered with an appropriate time frame prior to the initiation of the procedure CONTRAST:  45 mL OMNIPAQUE IOHEXOL 300 MG/ML  SOLN ANESTHESIA/SEDATION: Moderate (conscious) sedation was employed during this procedure. A total of Versed 3 mg and Fentanyl 100 mcg was administered intravenously. Moderate Sedation Time: 88 minutes. The patient's level of consciousness and vital signs were monitored continuously by radiology nursing throughout the procedure under my direct supervision. FLUOROSCOPY TIME:  10 minutes, 54 seconds (440 mGy) COMPLICATIONS: None immediate. TECHNIQUE: Informed written consent was obtained from the patient after a discussion of the risks, benefits and alternatives to treatment. Questions regarding the procedure were encouraged and answered. A timeout was performed prior to the initiation of the  procedure. The right upper abdominal quadrant was prepped and draped in the usual sterile fashion, and a sterile drape was applied covering the operative field. Maximum barrier sterile technique with sterile gowns and gloves were used for the procedure. A timeout was performed prior to the initiation of the procedure. Sonographic evaluation was performed the liver demonstrating only a small amount of peripheral and central intrahepatic biliary duct dilatation with marked distension of gallbladder. Prolonged efforts were made to cannulate a peripherally located bile duct within both the right and left lobes of the liver however despite prolonged effort, a bile duct was unable to be successfully cannulated catheterized either with ultrasound or fluoroscopic guidance secondary to lack of biliary ductal dilatation as well as poor percutaneous window due to interposition the ribs and the costal cartilage. As such the procedure was terminated. Dressings were applied. The patient tolerated the procedure well without immediate postprocedural complication. FINDINGS: Sonographic evaluation demonstrates lack of significant dilatation of either the peripheral or central aspect of the intrahepatic biliary tree and despite prolonged efforts, a bile duct was unable to be successfully opacified and/or cannulated. IMPRESSION: Attempted though ultimately unsuccessful percutaneous cholangiogram and biliary drain placement due to a combination lack significant dilatation the central of peripheral intrahepatic biliary system as well as poor percutaneous window due to interposition of ribs and the costal cartilage. PLAN: Will recheck patient's bilirubin level tomorrow and will discuss with the providing service the appropriateness of proceeding with repeat attempt versus cholecystostomy tube placement as indicated. Electronically Signed   By: Sandi Mariscal M.D.   On: 09/10/2020 09:57     Scheduled Meds:  atorvastatin  20 mg Oral Daily    Chlorhexidine Gluconate Cloth  6 each Topical Daily   dorzolamide-timolol  1 drop Both Eyes BID   famotidine  20 mg Oral Daily   latanoprost  1 drop Both Eyes QHS   meclizine  12.5 mg Oral Daily   multivitamin with minerals  1 tablet Oral Daily   Continuous Infusions:  lactated ringers 75 mL/hr at 09/10/20 1041     LOS: 0 days    Time spent: 62min  Domenic Polite, MD [facility]   09/10/2020, 11:02 AM

## 2020-09-10 NOTE — Procedures (Signed)
Pre procedural Dx: Obstructive Juandice Post procedural Dx: Same  Successful placement of a left hepatic approach transhepatic 10 Fr biliary drainage catheter with end coiled and locked within the duodenum.  Biliary drain connected to gravity bag.  EBL: Trace Complications: None immediate  Ronny Bacon, MD Pager #: [phone]

## 2020-09-10 NOTE — Progress Notes (Signed)
Referring Physician(s): Mansouraty,G  Supervising Physician: Simonne Come  Patient Status:  [facility] - In-pt  Chief Complaint: Gastric cancer, biliary obstruction    Subjective: Patient currently without significant complaints, specifically denies fever, headache, chest pain, dyspnea, cough, abdominal/back pain, nausea, vomiting or bleeding.    Allergies: Patient has no known allergies.  Medications: Prior to Admission medications   Medication Sig Start Date End Date Taking? Authorizing Provider  atorvastatin (LIPITOR) 20 MG tablet Take 1 tablet (20 mg total) by mouth daily. Patient taking differently: Take 20 mg by mouth in the morning. 04/26/20 04/26/21 Yes Corwin Levins, MD  dorzolamide-timolol (COSOPT) 22.3-6.8 MG/ML ophthalmic solution Place 1 drop into both eyes 2 (two) times daily. 06/15/20  Yes [provider]  latanoprost (XALATAN) 0.005 % ophthalmic solution Place 1 drop into both eyes at bedtime.  01/09/19  Yes [provider]  meclizine (ANTIVERT) 12.5 MG tablet TAKE 1 TABLET BY MOUTH 3 TIMES DAILY AS NEEDED FOR DIZZINESS. Patient taking differently: Take 12.5 mg by mouth in the morning. 08/10/20  Yes Corwin Levins, MD  Multiple Vitamin (MULTIVITAMIN WITH MINERALS) TABS tablet Take 1 tablet by mouth in the morning. Men's One-A-Day Chewable   Yes [provider]  ondansetron (ZOFRAN) 8 MG tablet Take 1 tablet (8 mg total) by mouth 2 (two) times daily as needed (Nausea or vomiting). 07/23/20  Yes Ennever, Rose Phi, MD  SYSTANE ULTRA 0.4-0.3 % SOLN Apply 1 drop to eye 3 (three) times daily as needed (dry eyes).  01/09/19  Yes [provider]  traMADol (ULTRAM) 50 MG tablet Take 2 tablets (100 mg total) by mouth at bedtime as needed for moderate pain or severe pain. 04/22/20  Yes Corwin Levins, MD  famotidine (PEPCID) 20 MG tablet TAKE 1 TABLET BY MOUTH EVERY DAY 09/10/20   Josph Macho, MD  fluconazole (DIFLUCAN) 100 MG tablet Take 1 tablet (100  mg total) by mouth daily. Patient not taking: Reported on 09/08/2020 08/07/20   Benancio Deeds, MD  lidocaine-prilocaine (EMLA) cream Apply 1 application topically as needed. Place on the port one hour before appointment. 08/24/20   Josph Macho, MD     Vital Signs: BP 135/78 (BP Location: Right Arm)   Pulse (!) 53   Temp 98.4 F (36.9 C) (Oral)   Resp 17   Ht 5\' 8"  (1.727 m)   Wt 185 lb 13.6 oz (84.3 kg)   SpO2 100%   BMI 28.26 kg/m   Physical Exam awake, alert.  Chest clear to auscultation bilaterally.  Heart with slightly bradycardic but regular rhythm.  Abdomen soft, positive bowel sounds, currently nontender.  No lower extremity edema.  Imaging: DG ERCP  Result Date: 09/09/2020 CLINICAL DATA:  Attempted ERCP EXAM: ERCP TECHNIQUE: Multiple spot images obtained with the fluoroscopic device and submitted for interpretation post-procedure. FLUOROSCOPY TIME:  Fluoroscopy Time:  3 minutes 38 seconds Number of Acquired Spot Images: 0 COMPARISON:  MRCP 09/04/2020 FINDINGS: A total of 20 intraoperative saved images are submitted for review. The images demonstrate a flexible duodenal scope in various positions within the duodenum and proximal jejunum. No evidence of immediate complication. IMPRESSION: Attempted ERCP. These images were submitted for radiologic interpretation only. Please see the procedural report for the amount of contrast and the fluoroscopy time utilized. Electronically Signed   By: 11/04/2020 M.D.   On: 09/09/2020 14:47   IR BILIARY DRAIN PLACEMENT WITH CHOLANGIOGRAM  Result Date: 09/10/2020 INDICATION: History of gastric cancer, now  with obstructive jaundice, post failed attempt at ERCP biliary stent placement. EXAM: ATTEMPTED THOUGH ULTIMATELY UNSUCCESSFUL ULTRASOUND AND FLUOROSCOPIC GUIDED PERCUTANEOUS TRANSHEPATIC CHOLANGIOGRAM AND BILIARY TUBE PLACEMENT COMPARISON:  MRCP-09/04/2020; PET-CT-06/26/2018 MEDICATIONS: Cefoxitin 2 gm IV; The antibiotic was  administered with an appropriate time frame prior to the initiation of the procedure CONTRAST:  45 mL OMNIPAQUE IOHEXOL 300 MG/ML  SOLN ANESTHESIA/SEDATION: Moderate (conscious) sedation was employed during this procedure. A total of Versed 3 mg and Fentanyl 100 mcg was administered intravenously. Moderate Sedation Time: 88 minutes. The patient's level of consciousness and vital signs were monitored continuously by radiology nursing throughout the procedure under my direct supervision. FLUOROSCOPY TIME:  10 minutes, 54 seconds (329 mGy) COMPLICATIONS: None immediate. TECHNIQUE: Informed written consent was obtained from the patient after a discussion of the risks, benefits and alternatives to treatment. Questions regarding the procedure were encouraged and answered. A timeout was performed prior to the initiation of the procedure. The right upper abdominal quadrant was prepped and draped in the usual sterile fashion, and a sterile drape was applied covering the operative field. Maximum barrier sterile technique with sterile gowns and gloves were used for the procedure. A timeout was performed prior to the initiation of the procedure. Sonographic evaluation was performed the liver demonstrating only a small amount of peripheral and central intrahepatic biliary duct dilatation with marked distension of gallbladder. Prolonged efforts were made to cannulate a peripherally located bile duct within both the right and left lobes of the liver however despite prolonged effort, a bile duct was unable to be successfully cannulated catheterized either with ultrasound or fluoroscopic guidance secondary to lack of biliary ductal dilatation as well as poor percutaneous window due to interposition the ribs and the costal cartilage. As such the procedure was terminated. Dressings were applied. The patient tolerated the procedure well without immediate postprocedural complication. FINDINGS: Sonographic evaluation demonstrates lack of  significant dilatation of either the peripheral or central aspect of the intrahepatic biliary tree and despite prolonged efforts, a bile duct was unable to be successfully opacified and/or cannulated. IMPRESSION: Attempted though ultimately unsuccessful percutaneous cholangiogram and biliary drain placement due to a combination lack significant dilatation the central of peripheral intrahepatic biliary system as well as poor percutaneous window due to interposition of ribs and the costal cartilage. PLAN: Will recheck patient's bilirubin level tomorrow and will discuss with the providing service the appropriateness of proceeding with repeat attempt versus cholecystostomy tube placement as indicated. Electronically Signed   By: Sandi Mariscal M.D.   On: 09/10/2020 09:57    Labs:  CBC: Recent Labs    08/14/20 0927 08/20/20 1430 09/04/20 1005 09/10/20 0457  WBC 4.0 3.0* 3.7* 4.7  HGB 10.7* 10.1* 10.9* 10.6*  HCT 33.0* 30.5* 33.4* 32.2*  PLT 184 168 201 180    COAGS: Recent Labs    01/06/20 0915 09/09/20 1900  INR 1.0 1.0  APTT 29  --     BMP: Recent Labs    10/30/19 0900 11/20/19 0900 12/11/19 1000 12/31/19 1056 02/18/20 1025 08/20/20 1430 09/04/20 1005 09/09/20 1900 09/10/20 0457  NA 141 139 141 140   < > 140 141 141 140  141  K 3.5 4.0 4.0 4.0   < > 4.1 3.7 3.4* 3.5  3.5  CL 108 104 107 106   < > 108 109 107 107  108  CO2 $Re'27 30 29 28   'tTv$ < > $R'29 26 26 29  29  'st$ GLUCOSE 173* 103* 105* 127*   < >  87 106* 114* 124*  126*  BUN $Re'12 18 13 16   'iVV$ < > $R'12 9 11 10  10  'qT$ CALCIUM 9.2 9.8 9.4 9.4   < > 9.2 9.1 9.2 8.8*  8.8*  CREATININE 0.76 0.90 0.83 0.87   < > 0.76 0.76 0.80 0.68  0.52*  GFRNONAA >60 >60 >60 >60   < > >60 >60 >60 >60  >60  GFRAA >60 >60 >60 >60  --   --   --   --   --    < > = values in this interval not displayed.    LIVER FUNCTION TESTS: Recent Labs    08/20/20 1430 09/04/20 1005 09/09/20 1900 09/10/20 0457  BILITOT 0.6 5.6* 10.6* 9.7*  10.1*  AST 37 191*  271* 214*  213*  ALT 43 245* 300* 253*  249*  ALKPHOS 139* 400* 467* 383*  393*  PROT 6.0* 6.2* 6.9 6.1*  6.1*  ALBUMIN 3.6 3.7 3.4* 2.9*  3.0*    Assessment and Plan: 80 y.o. male with past medical history significant for acoustic neuroma, anemia, arthritis, BPH, diabetes, glaucoma, hypertension, hyperlipidemia, and gastric cancer with prior gastrectomy and Billroth II anatomy.  Patient had recent MRCP that showed diffuse biliary ductal dilatation with a soft tissue mass in porta hepatis, likely representing metastatic lymphadenopathy.  Also noted was a small mass in the pancreatic body along with multiple subcentimeter enhancing soft tissue nodules along the capsular surface of the left hepatic lobe and bilateral upper quadrant omental fat.  He underwent failed OP ERCP 6/8; s/p attempted though unsuccessful PTC with biliary drain placement yesterday due to combination of lack of significant ductal dilatation as well as poor percutaneous window; afebrile, BP okay, WBC normal, hemoglobin stable at 10.6, platelets normal, creatinine 0.52, total bilirubin 10.1 down slightly from 10.6 ,additional LFTs slightly lower; patient underwent follow-up CT abdomen pelvis today, results pending; case discussed with Dr. Pascal Lux and plan is for reattempt at [facility] with biliary drain placement today.  Above plans discussed with patient and spouse with their understanding and consent.   Electronically Signed: D. Rowe Robert, PA-C 09/10/2020, 1:16 PM   I spent a total of 20 minutes at the the patient's bedside AND on the patient's hospital floor or unit, greater than 50% of which was counseling/coordinating care for percutaneous transhepatic cholangiogram with biliary drain placement    [name], male   DOB: 05/13/1940, 80 y.o.   MRN: [id number]

## 2020-09-10 NOTE — Progress Notes (Signed)
      Chief Complaint:    biliary obstruction      ASSESSMENT / PLAN:    Brief History : 80 yo male with DM, glaucoma, HTN, HLD, gastric adenocarcinoma, diagnosed in 2020 s/p distal gastrectomy / BR II with later diagnosis of metastatic disease, GERD / esophagitis / adenomatous colon polyps, diverticulosis  # History of stage IIIA ( T3N2MO) adenocarcinoma of the stomach, s/p distal gastrectomy Dec 2020. He now has metastatic disease. Getting Taxol. Was on Cyramza but recently held due to GI bleeding. Followed by Dr. Marin Olp  #  Obstructive jaundice. MRCP showed diffuse biliary duct dilation with a soft tissue mass in porta hepatis, likely metastatic LAN. ERCP yesterday >>> bile duct could not be cannulated. Yesterday IR attempted placement of a PBD but unsuccessful due to lack of intrahepatic biliary duct dilation.  Plan is for IR to reattempt drain placement today.  --Patient not seen today, in IR for most of day. Dr. Hilarie Fredrickson and I went to IR Department, patient on table being prepared for procedure. We spoke with Dr. Pascal Lux. If he is unable to place the percutaneous biliary drain then there is the option is for a cholecystostomy tube. However, a cholecystostomy tube would not allow intrahepatic ducts to dilate for future placement of percutaneous biliary drain.  --Will see patient tomorrow. Hopefully IR procedure will be successful and patient will be discharged home with PBD.    Tye Savoy ,NP 09/10/2020, 11:19 AM

## 2020-09-11 ENCOUNTER — Encounter (HOSPITAL_COMMUNITY): Payer: Self-pay | Admitting: Gastroenterology

## 2020-09-11 DIAGNOSIS — Z833 Family history of diabetes mellitus: Secondary | ICD-10-CM | POA: Diagnosis not present

## 2020-09-11 DIAGNOSIS — Z79899 Other long term (current) drug therapy: Secondary | ICD-10-CM | POA: Diagnosis not present

## 2020-09-11 DIAGNOSIS — Z20822 Contact with and (suspected) exposure to covid-19: Secondary | ICD-10-CM | POA: Diagnosis present

## 2020-09-11 DIAGNOSIS — Z538 Procedure and treatment not carried out for other reasons: Secondary | ICD-10-CM | POA: Diagnosis not present

## 2020-09-11 DIAGNOSIS — H409 Unspecified glaucoma: Secondary | ICD-10-CM | POA: Diagnosis present

## 2020-09-11 DIAGNOSIS — Z87891 Personal history of nicotine dependence: Secondary | ICD-10-CM | POA: Diagnosis not present

## 2020-09-11 DIAGNOSIS — K831 Obstruction of bile duct: Secondary | ICD-10-CM | POA: Diagnosis not present

## 2020-09-11 DIAGNOSIS — K5900 Constipation, unspecified: Secondary | ICD-10-CM | POA: Diagnosis present

## 2020-09-11 DIAGNOSIS — Z83438 Family history of other disorder of lipoprotein metabolism and other lipidemia: Secondary | ICD-10-CM | POA: Diagnosis not present

## 2020-09-11 DIAGNOSIS — E1140 Type 2 diabetes mellitus with diabetic neuropathy, unspecified: Secondary | ICD-10-CM | POA: Diagnosis present

## 2020-09-11 DIAGNOSIS — N400 Enlarged prostate without lower urinary tract symptoms: Secondary | ICD-10-CM | POA: Diagnosis present

## 2020-09-11 DIAGNOSIS — Z9689 Presence of other specified functional implants: Secondary | ICD-10-CM | POA: Diagnosis not present

## 2020-09-11 DIAGNOSIS — K219 Gastro-esophageal reflux disease without esophagitis: Secondary | ICD-10-CM | POA: Diagnosis present

## 2020-09-11 DIAGNOSIS — K869 Disease of pancreas, unspecified: Secondary | ICD-10-CM | POA: Diagnosis present

## 2020-09-11 DIAGNOSIS — M1990 Unspecified osteoarthritis, unspecified site: Secondary | ICD-10-CM | POA: Diagnosis present

## 2020-09-11 DIAGNOSIS — K7689 Other specified diseases of liver: Secondary | ICD-10-CM | POA: Diagnosis present

## 2020-09-11 DIAGNOSIS — C169 Malignant neoplasm of stomach, unspecified: Secondary | ICD-10-CM | POA: Diagnosis not present

## 2020-09-11 DIAGNOSIS — Z903 Acquired absence of stomach [part of]: Secondary | ICD-10-CM | POA: Diagnosis not present

## 2020-09-11 DIAGNOSIS — Z85028 Personal history of other malignant neoplasm of stomach: Secondary | ICD-10-CM | POA: Diagnosis not present

## 2020-09-11 DIAGNOSIS — D509 Iron deficiency anemia, unspecified: Secondary | ICD-10-CM | POA: Diagnosis present

## 2020-09-11 DIAGNOSIS — C162 Malignant neoplasm of body of stomach: Secondary | ICD-10-CM | POA: Diagnosis not present

## 2020-09-11 DIAGNOSIS — Z981 Arthrodesis status: Secondary | ICD-10-CM | POA: Diagnosis not present

## 2020-09-11 DIAGNOSIS — I10 Essential (primary) hypertension: Secondary | ICD-10-CM | POA: Diagnosis present

## 2020-09-11 DIAGNOSIS — C772 Secondary and unspecified malignant neoplasm of intra-abdominal lymph nodes: Secondary | ICD-10-CM | POA: Diagnosis present

## 2020-09-11 DIAGNOSIS — E785 Hyperlipidemia, unspecified: Secondary | ICD-10-CM | POA: Diagnosis present

## 2020-09-11 DIAGNOSIS — Z8249 Family history of ischemic heart disease and other diseases of the circulatory system: Secondary | ICD-10-CM | POA: Diagnosis not present

## 2020-09-11 DIAGNOSIS — C786 Secondary malignant neoplasm of retroperitoneum and peritoneum: Secondary | ICD-10-CM | POA: Diagnosis present

## 2020-09-11 LAB — COMPREHENSIVE METABOLIC PANEL
ALT: 208 U/L — ABNORMAL HIGH (ref 0–44)
AST: 150 U/L — ABNORMAL HIGH (ref 15–41)
Albumin: 3 g/dL — ABNORMAL LOW (ref 3.5–5.0)
Alkaline Phosphatase: 373 U/L — ABNORMAL HIGH (ref 38–126)
Anion gap: 8 (ref 5–15)
BUN: 9 mg/dL (ref 8–23)
CO2: 28 mmol/L (ref 22–32)
Calcium: 8.9 mg/dL (ref 8.9–10.3)
Chloride: 102 mmol/L (ref 98–111)
Creatinine, Ser: 0.61 mg/dL (ref 0.61–1.24)
GFR, Estimated: >60 mL/min (ref >60)
Glucose, Bld: 129 mg/dL — ABNORMAL HIGH (ref 70–99)
Potassium: 3.7 mmol/L (ref 3.5–5.1)
Sodium: 138 mmol/L (ref 135–145)
Total Bilirubin: 5.5 mg/dL — ABNORMAL HIGH (ref 0.3–1.2)
Total Protein: 6.3 g/dL — ABNORMAL LOW (ref 6.5–8.1)

## 2020-09-11 LAB — CBC WITH DIFFERENTIAL/PLATELET
Abs Immature Granulocytes: 0.01 10*3/uL (ref 0.00–0.07)
Basophils Absolute: 0 10*3/uL (ref 0.0–0.1)
Basophils Relative: 0 %
Eosinophils Absolute: 0.1 10*3/uL (ref 0.0–0.5)
Eosinophils Relative: 1 %
HCT: 33.3 % — ABNORMAL LOW (ref 39.0–52.0)
Hemoglobin: 11.3 g/dL — ABNORMAL LOW (ref 13.0–17.0)
Immature Granulocytes: 0 %
Lymphocytes Relative: 19 %
Lymphs Abs: 1.2 10*3/uL (ref 0.7–4.0)
MCH: 29.4 pg (ref 26.0–34.0)
MCHC: 33.9 g/dL (ref 30.0–36.0)
MCV: 86.5 fL (ref 80.0–100.0)
Monocytes Absolute: 0.5 10*3/uL (ref 0.1–1.0)
Monocytes Relative: 8 %
Neutro Abs: 4.4 10*3/uL (ref 1.7–7.7)
Neutrophils Relative %: 72 %
Platelets: 182 10*3/uL (ref 150–400)
RBC: 3.85 MIL/uL — ABNORMAL LOW (ref 4.22–5.81)
RDW: 14.8 % (ref 11.5–15.5)
WBC: 6.2 10*3/uL (ref 4.0–10.5)
nRBC: 0 % (ref 0.0–0.2)

## 2020-09-11 LAB — HEMOGLOBIN A1C
Hgb A1c MFr Bld: 6.1 % — ABNORMAL HIGH (ref 4.8–5.6)
Mean Plasma Glucose: 128 mg/dL

## 2020-09-11 MED ORDER — BISACODYL 10 MG RE SUPP
10.0000 mg | Freq: Once | RECTAL | Status: AC
  Administered 2020-09-11: 10 mg via RECTAL
  Filled 2020-09-11: qty 1

## 2020-09-11 MED ORDER — LACTULOSE 10 GM/15ML PO SOLN
20.0000 g | Freq: Three times a day (TID) | ORAL | Status: DC
  Administered 2020-09-11 – 2020-09-12 (×4): 20 g via ORAL
  Filled 2020-09-11 (×3): qty 30

## 2020-09-11 NOTE — Consult Note (Signed)
Referral MD  Reason for Referral: Biliary obstruction secondary to malignant gastric adenocarcinoma  No chief complaint on file. : Patient recently with biliary stent placed.  HPI: [name] is well-known to me.  He is a very nice 80 year old African-American male.  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  He has a history of gastric adenocarcinoma.  He initially underwent surgery for stage III disease.  Unfortunately, he recurred despite having adjuvant therapy.  He started on treatment with Taxol/Cyramza.  He began to have GI bleeding and the Cyramza was discontinued.  He was in the office a week or so ago.  He is in for his second cycle of treatment.  He was found to have a bilirubin of 5.6.  A MRI was done.  This showed a malignant biliary obstruction because of And porta hepatis lymph node.  He had disease outside of the liver.  There was peritoneal disease.  Gastroenterology try to put in a stent.  Because of his past surgery, that being a Billroth II procedure, a stent could not be placed.  He was subsequently sent to Interventional Radiology.  They did a fantastic job and finally was able to get the percutaneous drain in on 09/10/2020.  He is draining.  Prior to the procedure, his bilirubin was 9.7.  His liver function studies were elevated.  He has not had any labs done today.  He is constipated.  This is in his biggest complaint.  There is been a little bit of abdominal pain.  He has had no cough.  He has had no fever.  There has been no bleeding.  He does seem to be eating okay.  Currently, his performance status is ECOG 1.    Past Medical History:  Diagnosis Date   Acoustic neuroma ([place]) 06/25/2015   Allergic rhinitis 07/18/2014   Allergy    Anemia    Arthritis    BPH (benign prostatic hyperplasia)    Cancer ([place])    stage III stomach-Dx 03/2019   Carbuncle    recurrent MRSA carbuncles   Cataract    Per pt bilateral cataracts removed.   Diabetes mellitus    Type  II. Per pt Dr. Jenny Reichmann took him off his dm med.   Disk prolapse    Glaucoma    Hyperlipidemia    Hypertension    Male hypogonadism 07/16/2014   Neuropathy    Pneumonia    Sinus bradycardia    chronic, asymptomatic   Sinusitis 07/29/2012  :   Past Surgical History:  Procedure Laterality Date   ANTERIOR CERVICAL DECOMPRESSION/DISCECTOMY FUSION 4 LEVELS N/A 01/08/2020   Procedure: ANTERIOR CERVICAL DECOMPRESSION FUSION CERVICAL 3-4, CERVICAL 4-5, CERVIAL 5-6 WITH INSTRUMENTATION AND ALLOGRAFT;  Surgeon: Phylliss Bob, MD;  Location: [place];  Service: Orthopedics;  Laterality: N/A;   BALLOON DILATION N/A 03/12/2019   Procedure: BALLOON DILATION;  Surgeon: Rush Landmark Telford Nab., MD;  Location: [facility];  Service: Gastroenterology;  Laterality: N/A;  pyloric   BIOPSY  03/08/2019   Procedure: BIOPSY;  Surgeon: Yetta Flock, MD;  Location: [facility];  Service: Gastroenterology;;   BIOPSY  03/12/2019   Procedure: BIOPSY;  Surgeon: Irving Copas., MD;  Location: [facility];  Service: Gastroenterology;;   CATARACT EXTRACTION     x 2   COLONOSCOPY     ESOPHAGOGASTRODUODENOSCOPY N/A 03/08/2019   Procedure: ESOPHAGOGASTRODUODENOSCOPY (EGD);  Surgeon: Yetta Flock, MD;  Location: [facility];  Service: Gastroenterology;  Laterality: N/A;   ESOPHAGOGASTRODUODENOSCOPY (EGD) WITH  PROPOFOL N/A 03/12/2019   Procedure: ESOPHAGOGASTRODUODENOSCOPY (EGD) WITH PROPOFOL;  Surgeon: Rush Landmark Telford Nab., MD;  Location: [facility];  Service: Gastroenterology;  Laterality: N/A;   FINE NEEDLE ASPIRATION  03/12/2019   Procedure: FINE NEEDLE ASPIRATION (FNA) LINEAR;  Surgeon: Irving Copas., MD;  Location: [facility];  Service: Gastroenterology;;   FOREIGN BODY REMOVAL  03/08/2019   Procedure: FOREIGN BODY REMOVAL;  Surgeon: Yetta Flock, MD;  Location: [facility];  Service: Gastroenterology;;   IR BILIARY DRAIN PLACEMENT WITH CHOLANGIOGRAM  09/09/2020   IR INT EXT  BILIARY DRAIN WITH CHOLANGIOGRAM  09/10/2020   IR PATIENT EVAL TECH 0-60 MINS  04/03/2019   LAPAROTOMY N/A 03/19/2019   Procedure: EXPLORATORY LAPAROTOMY, distal GASTRECTOMY AND PLACEMENT OF G AND J TUBE, gastric jejunostomy;  Surgeon: Greer Pickerel, MD;  Location: [facility];  Service: General;  Laterality: N/A;   POLYPECTOMY     PORTACATH PLACEMENT N/A 03/26/2019   Procedure: INSERTION PORT-A-CATH;  Surgeon: Michael Boston, MD;  Location: [facility];  Service: General;  Laterality: N/A;   STOMACH SURGERY     UPPER ESOPHAGEAL ENDOSCOPIC ULTRASOUND (EUS) N/A 03/12/2019   Procedure: UPPER ESOPHAGEAL ENDOSCOPIC ULTRASOUND (EUS);  Surgeon: Irving Copas., MD;  Location: [facility];  Service: Gastroenterology;  Laterality: N/A;   UPPER GASTROINTESTINAL ENDOSCOPY     VIDEO BRONCHOSCOPY WITH ENDOBRONCHIAL ULTRASOUND N/A 05/22/2019   Procedure: VIDEO BRONCHOSCOPY WITH ENDOBRONCHIAL ULTRASOUND;  Surgeon: Garner Nash, DO;  Location: [facility];  Service: Thoracic;  Laterality: N/A;  :   Current Facility-Administered Medications:    atorvastatin (LIPITOR) tablet 20 mg, 20 mg, Oral, Daily, Kyle, Tyrone A, DO, 20 mg at 09/10/20 1038   Chlorhexidine Gluconate Cloth 2 % PADS 6 each, 6 each, Topical, Daily, Domenic Polite, MD, 6 each at 09/10/20 1041   dorzolamide-timolol (COSOPT) 22.3-6.8 MG/ML ophthalmic solution 1 drop, 1 drop, Both Eyes, BID, Kyle, Tyrone A, DO, 1 drop at 09/10/20 2233   famotidine (PEPCID) tablet 20 mg, 20 mg, Oral, Daily, Kyle, Tyrone A, DO, 20 mg at 09/10/20 1038   lactated ringers infusion, , Intravenous, Continuous, Domenic Polite, MD, Last Rate: 75 mL/hr at 09/11/20 0531, New Bag at 09/11/20 0531   latanoprost (XALATAN) 0.005 % ophthalmic solution 1 drop, 1 drop, Both Eyes, QHS, Kyle, Tyrone A, DO, 1 drop at 09/10/20 2233   meclizine (ANTIVERT) tablet 12.5 mg, 12.5 mg, Oral, Daily, Kyle, Tyrone A, DO, 12.5 mg at 09/10/20 1038   multivitamin with minerals tablet 1 tablet, 1 tablet,  Oral, Daily, Kyle, Tyrone A, DO, 1 tablet at 09/10/20 1038   ondansetron (ZOFRAN) tablet 4 mg, 4 mg, Oral, Q6H PRN **OR** ondansetron (ZOFRAN) injection 4 mg, 4 mg, Intravenous, Q6H PRN, Marylyn Ishihara, Tyrone A, DO   polyvinyl alcohol (LIQUIFILM TEARS) 1.4 % ophthalmic solution 1 drop, 1 drop, Both Eyes, TID PRN, Marylyn Ishihara, Tyrone A, DO   sodium chloride flush (NS) 0.9 % injection 5 mL, 5 mL, Intracatheter, Q8H, Watts, John, MD, 5 mL at 09/10/20 2235:   atorvastatin  20 mg Oral Daily   Chlorhexidine Gluconate Cloth  6 each Topical Daily   dorzolamide-timolol  1 drop Both Eyes BID   famotidine  20 mg Oral Daily   latanoprost  1 drop Both Eyes QHS   meclizine  12.5 mg Oral Daily   multivitamin with minerals  1 tablet Oral Daily   sodium chloride flush  5 mL Intracatheter Q8H  :  No Known Allergies:   Family History  Problem Relation Age of  Onset   Cancer Mother    Hypertension Mother    Diabetes Sister    Cancer Brother        colon   Hypertension Brother    Hyperlipidemia Brother    Stroke Brother    Hypertension Sister    Colon cancer Neg Hx    Heart attack Neg Hx    Sudden death Neg Hx    Esophageal cancer Neg Hx    Pancreatic cancer Neg Hx    Stomach cancer Neg Hx    Rectal cancer Neg Hx   :   Social History   Socioeconomic History   Marital status: Married    Spouse name: Not on file   Number of children: 7   Years of education: 7   Highest education level: Not on file  Occupational History   Occupation: Mining engineer: RETIRED  Tobacco Use   Smoking status: Former    Packs/day: 1.00    Years: 24.00    Pack years: 24.00    Types: Cigarettes    Quit date: 10/03/1978    Years since quitting: 41.9   Smokeless tobacco: Never  Vaping Use   Vaping Use: Never used  Substance and Sexual Activity   Alcohol use: Not Currently   Drug use: No   Sexual activity: Yes    Partners: Female  Other Topics Concern   Not on file  Social History Narrative   9th grade.  Married  '60. [name] 3 sons -'43 '64, '66; 4 dtrs -'60, '62, '61, '65; 15 grands, 25 great-grands.   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   ACD- discussed living will and HCPOA (July '14) provided packet.          Social Determinants of Health   Financial Resource Strain: Not on file  Food Insecurity: Not on file  Transportation Needs: Not on file  Physical Activity: Not on file  Stress: Not on file  Social Connections: Not on file  Intimate Partner Violence: Not on file  :  Review of Systems  Constitutional: Negative.   HENT: Negative.    Eyes: Negative.   Respiratory: Negative.    Cardiovascular: Negative.   Gastrointestinal:  Positive for abdominal pain and constipation.  Genitourinary: Negative.   Musculoskeletal: Negative.   Skin: Negative.   Neurological: Negative.   Endo/Heme/Allergies: Negative.   Psychiatric/Behavioral: Negative.      Exam:  This is a well-developed well-nourished African-American male in no obvious distress.  Vital signs show temperature of 98.4.  Pulse 65.  Blood pressure 142/82.  His head neck exam shows some scleral icterus.  He has no adenopathy in the neck.  Lungs are clear.  Cardiac exam regular rate and rhythm.  Abdomen shows the biliary drain in the anterior abdominal wall.  He has a dressing over this.  Bowel sounds are somewhat decreased.  There is no distention.  He has no obvious abdominal mass.  There is no palpable liver or spleen tip.  Extremity shows no clubbing, cyanosis or edema.  Neurological exam is nonfocal.   Patient Vitals for the past 24 hrs:  BP Temp Temp src Pulse Resp SpO2  09/11/20 0220 (!) 142/82 98.4 F (36.9 C) Oral 65 16 100 %  09/10/20 2129 (!) 144/82 98.8 F (37.1 C) Oral 63 18 100 %  09/10/20 1816 (!) 156/85 97.8 F (36.6 C) Oral (!) 57 18 100 %  09/10/20 1722 (!) 157/82 98.2 F (36.8 C) Oral (!) 55  18 100 %  09/10/20 1543 (!) 141/79 98.3 F (36.8 C) Oral (!) 54 18 100 %  09/10/20 1500 139/80 -- -- (!)  57 13 100 %  09/10/20 1455 123/82 -- -- (!) 56 (!) 9 100 %  09/10/20 1450 (!) 142/80 -- -- -- (!) 8 --  09/10/20 1440 (!) 173/85 -- -- (!) 54 11 100 %  09/10/20 1358 140/86 -- -- (!) 55 18 100 %      Recent Labs    09/10/20 0457  WBC 4.7  HGB 10.6*  HCT 32.2*  PLT 180    Recent Labs    09/09/20 1900 09/10/20 0457  NA 141 140  141  K 3.4* 3.5  3.5  CL 107 107  108  CO2 26 29  29   GLUCOSE 114* 124*  126*  BUN 11 10  10   CREATININE 0.80 0.68  0.52*  CALCIUM 9.2 8.8*  8.8*    Blood smear review: None  Pathology: None    Assessment and Plan: Mr. Zelek is an 80 year old African-American male.  He has a malignant biliary obstruction because of metastatic gastric adenocarcinoma.  I think the question now is how we try to treat this.  He has had only 1 cycle of chemotherapy with the Taxol.  We can certainly continue along with this.  I think another option might be some radiation therapy to the right upper quadrant.  Again I know he has metastatic disease but we are trying to help with prevention of complications from his cancer.  We can certainly try radiation therapy.  I could give him some Xeloda at the radiation sensitizer.  We have to see what his labs look like right now.  Hopefully his bilirubin is dropping.  I will give him some lactulose to help with the constipation.  Hopefully, he might be able to go home today if his bilirubin is dropping.  I do appreciate the outstanding care he is gotten in the hospital.  I know everybody is tried hard to help with this obstruction and to get it bypassed.   Lattie Haw, MD  Psalms 117:2

## 2020-09-11 NOTE — Progress Notes (Signed)
Referring Physician(s): Dr. Rush Landmark  Supervising Physician: Aletta Edouard  Patient Status:  [facility] - In-pt  Chief Complaint: Gastric cancer with biliary obstruction now S/p transhepatic internal/external biliary drainage catheter placed 09/10/20 by Dr. Pascal Lux  Subjective: Patient in bed awake and alert, his wife is at the bedside. He reports feeling better and is hoping to go home today or tomorrow. His only complaint is constipation.   Allergies: Patient has no known allergies.  Medications: Prior to Admission medications   Medication Sig Start Date End Date Taking? Authorizing Provider  atorvastatin (LIPITOR) 20 MG tablet Take 1 tablet (20 mg total) by mouth daily. Patient taking differently: Take 20 mg by mouth in the morning. 04/26/20 04/26/21 Yes Biagio Borg, MD  dorzolamide-timolol (COSOPT) 22.3-6.8 MG/ML ophthalmic solution Place 1 drop into both eyes 2 (two) times daily. 06/15/20  Yes [provider]  latanoprost (XALATAN) 0.005 % ophthalmic solution Place 1 drop into both eyes at bedtime.  01/09/19  Yes [provider]  meclizine (ANTIVERT) 12.5 MG tablet TAKE 1 TABLET BY MOUTH 3 TIMES DAILY AS NEEDED FOR DIZZINESS. Patient taking differently: Take 12.5 mg by mouth in the morning. 08/10/20  Yes Biagio Borg, MD  Multiple Vitamin (MULTIVITAMIN WITH MINERALS) TABS tablet Take 1 tablet by mouth in the morning. Men's One-A-Day Chewable   Yes [provider]  ondansetron (ZOFRAN) 8 MG tablet Take 1 tablet (8 mg total) by mouth 2 (two) times daily as needed (Nausea or vomiting). 07/23/20  Yes Ennever, Rudell Cobb, MD  SYSTANE ULTRA 0.4-0.3 % SOLN Apply 1 drop to eye 3 (three) times daily as needed (dry eyes).  01/09/19  Yes [provider]  traMADol (ULTRAM) 50 MG tablet Take 2 tablets (100 mg total) by mouth at bedtime as needed for moderate pain or severe pain. 04/22/20  Yes Biagio Borg, MD  famotidine (PEPCID) 20 MG tablet TAKE 1 TABLET BY MOUTH EVERY  DAY 09/10/20   Volanda Napoleon, MD  fluconazole (DIFLUCAN) 100 MG tablet Take 1 tablet (100 mg total) by mouth daily. Patient not taking: Reported on 09/08/2020 08/07/20   Yetta Flock, MD  lidocaine-prilocaine (EMLA) cream Apply 1 application topically as needed. Place on the port one hour before appointment. 08/24/20   Volanda Napoleon, MD     Vital Signs: BP (!) 155/89 (BP Location: Right Arm)   Pulse 66   Temp 98.4 F (36.9 C)   Resp 18   Ht 5\' 8"  (1.727 m)   Wt 185 lb 13.6 oz (84.3 kg)   SpO2 100%   BMI 28.26 kg/m   Physical Exam Constitutional:      General: He is not in acute distress. Pulmonary:     Effort: Pulmonary effort is normal.  Abdominal:     Palpations: Abdomen is soft.     Tenderness: There is no abdominal tenderness.     Comments: RUQ drain to gravity. Approximately 150 ml of bilious fluid in bag. Drain easily flushed. Dressing is clean and dry.   Skin:    General: Skin is warm and dry.  Neurological:     Mental Status: He is alert and oriented to person, place, and time.    Imaging: CT ABDOMEN PELVIS W CONTRAST  Result Date: 09/11/2020 CLINICAL DATA:  History of gastric cancer, now with elevated bilirubin post failed attempt at endoscopic biliary stent placement as well as percutaneous biliary drainage catheter placement. Please perform abdominal CT for potential repeat attempt at internal/external  biliary drainage catheter placement. EXAM: CT ABDOMEN AND PELVIS WITH CONTRAST TECHNIQUE: Multidetector CT imaging of the abdomen and pelvis was performed using the standard protocol following bolus administration of intravenous contrast. CONTRAST:  148mL OMNIPAQUE IOHEXOL 300 MG/ML  SOLN COMPARISON:  MRCP-09/04/2020; ERCP-09/09/2020; PET-CT-06/25/2020 FINDINGS: Lower chest: Limited visualization of the lower thorax demonstrates minimal dependent subpleural ground-glass atelectasis, right greater than left. There is a punctate granuloma within the right middle  lobe. No discrete focal airspace opacities. Borderline cardiomegaly. No pericardial effusion. Port a catheter tip terminates within the superior cavoatrial junction. Hepatobiliary: Redemonstrated marked distension of the gallbladder with very minimal intrahepatic biliary ductal dilatation, similar to abdominal MRCP performed 09/04/2020. No definitive gallbladder wall thickening or pericholecystic stranding. Scattered foci air at the level of the porta hepatis and minimal amount of ill-defined stranding adjacent to the anterior aspect the left lobe of the liver and right lateral body wall, the result recent attempted percutaneous biliary drainage catheter placement. Approximately 1.2 cm nodule adjacent to the anterior aspect the lobe liver image 11, series 7 similar to recent abdominal MRI and again favored to represent a hepatic capsular implant. There is a punctate (approximately 0.6 cm) hypoattenuating lesion with the dome of the right lobe of the liver (image 11, series 2, which is too small to accurately characterize favored to represent a hepatic cyst. Additional subcentimeter hypoattenuating hepatic lesions (images 21 and 31, series 2) too small to accurately characterize remain indeterminate. Pancreas: Previously question hypovascular involving the pancreatic body on recent abdominal MRI is not well demonstrated the present examination. No definitive downstream pancreatic ductal dilatation or peripancreatic stranding. Spleen: Normal appearance of the spleen. Adrenals/Urinary Tract: There is symmetric enhancement and excretion of the bilateral kidneys. There is a punctate (approximately 5 mm) nonobstructing stone within the inferior pole the right kidney image 34, series 2). No discrete left-sided renal stones on this postcontrast examination. No renal stones are seen along expected course of either ureter or the urinary bladder. Mild thickening of the urinary bladder wall, potentially accentuated due to  underdistention. Redemonstrated small left-sided extrarenal pelvis. No evidence of urinary obstruction. Normal appearance the bilateral adrenal glands. Stomach/Bowel: Stable postoperative change the stomach compatible with history partial gastric resection. Moderate colonic stool burden without evidence of enteric obstruction. Scattered colonic diverticulosis without evidence of superimposed acute diverticulitis. Normal appearance of the terminal ileum and the retrocecal appendix. No discrete areas of bowel wall thickening. No pneumoperitoneum, pneumatosis or portal venous gas. Vascular/Lymphatic: Normal caliber of the aorta. There is a very minimal amount of involving the bilateral common iliac and femoral arteries, not resulting in hemodynamically significant stenosis. The major branch vessels the aorta appear patent on this non CTA examination. No bulky retroperitoneal, pelvic or inguinal lymphadenopathy. Reproductive: Dystrophic calcifications within a borderline enlarged prostate gland. No free fluid the pelvic cul-de-sac. Other: Redemonstrated scattered peritoneal nodules with dominant nodule within the right upper abdominal quadrant measuring 1.4 x 1.1 cm (image 38, series 2). Additional peritoneal nodules are seen on images 27, 38, 43, 46 and 47, series 2. Musculoskeletal: No acute or aggressive osseous abnormalities. Redemonstrated bilateral L5 pars defects associated anterolisthesis. Mild-to-moderate multilevel lumbar spine DDD, worse at L4-L5 with disc space height loss, endplate irregularity and posterior directed osteophytosis. Mild degenerative change the hips with joint space loss, subchondral sclerosis and osteophytosis. No evidence. IMPRESSION: 1. Marked distension of the gallbladder with minimal intrahepatic biliary ductal dilatation, similar to MRCP performed 09/04/2020. 2. Redemonstrated suspected peritoneal metastatic disease including a subcapsular implant adjacent to  the left lobe of the  liver. 3. Previously questioned pancreatic head mass is not well demonstrated on the present examination though resolution should not assumed on the basis of this examination. 4. Similar appearing indeterminate hepatic lesions at least one of which was found to be hypermetabolic on PET-CT performed 06/25/2020. 5. Solitary punctate (5 mm) nonobstructing right-sided renal stone. Electronically Signed   By: Sandi Mariscal M.D.   On: 09/11/2020 08:29   DG ERCP  Result Date: 09/09/2020 CLINICAL DATA:  Attempted ERCP EXAM: ERCP TECHNIQUE: Multiple spot images obtained with the fluoroscopic device and submitted for interpretation post-procedure. FLUOROSCOPY TIME:  Fluoroscopy Time:  3 minutes 38 seconds Number of Acquired Spot Images: 0 COMPARISON:  MRCP 09/04/2020 FINDINGS: A total of 20 intraoperative saved images are submitted for review. The images demonstrate a flexible duodenal scope in various positions within the duodenum and proximal jejunum. No evidence of immediate complication. IMPRESSION: Attempted ERCP. These images were submitted for radiologic interpretation only. Please see the procedural report for the amount of contrast and the fluoroscopy time utilized. Electronically Signed   By: Jacqulynn Cadet M.D.   On: 09/09/2020 14:47   IR BILIARY DRAIN PLACEMENT WITH CHOLANGIOGRAM  Result Date: 09/10/2020 INDICATION: History of gastric cancer, now with obstructive jaundice, post failed attempt at ERCP biliary stent placement. EXAM: ATTEMPTED THOUGH ULTIMATELY UNSUCCESSFUL ULTRASOUND AND FLUOROSCOPIC GUIDED PERCUTANEOUS TRANSHEPATIC CHOLANGIOGRAM AND BILIARY TUBE PLACEMENT COMPARISON:  MRCP-09/04/2020; PET-CT-06/26/2018 MEDICATIONS: Cefoxitin 2 gm IV; The antibiotic was administered with an appropriate time frame prior to the initiation of the procedure CONTRAST:  45 mL OMNIPAQUE IOHEXOL 300 MG/ML  SOLN ANESTHESIA/SEDATION: Moderate (conscious) sedation was employed during this procedure. A total of Versed 3  mg and Fentanyl 100 mcg was administered intravenously. Moderate Sedation Time: 88 minutes. The patient's level of consciousness and vital signs were monitored continuously by radiology nursing throughout the procedure under my direct supervision. FLUOROSCOPY TIME:  10 minutes, 54 seconds (782 mGy) COMPLICATIONS: None immediate. TECHNIQUE: Informed written consent was obtained from the patient after a discussion of the risks, benefits and alternatives to treatment. Questions regarding the procedure were encouraged and answered. A timeout was performed prior to the initiation of the procedure. The right upper abdominal quadrant was prepped and draped in the usual sterile fashion, and a sterile drape was applied covering the operative field. Maximum barrier sterile technique with sterile gowns and gloves were used for the procedure. A timeout was performed prior to the initiation of the procedure. Sonographic evaluation was performed the liver demonstrating only a small amount of peripheral and central intrahepatic biliary duct dilatation with marked distension of gallbladder. Prolonged efforts were made to cannulate a peripherally located bile duct within both the right and left lobes of the liver however despite prolonged effort, a bile duct was unable to be successfully cannulated catheterized either with ultrasound or fluoroscopic guidance secondary to lack of biliary ductal dilatation as well as poor percutaneous window due to interposition the ribs and the costal cartilage. As such the procedure was terminated. Dressings were applied. The patient tolerated the procedure well without immediate postprocedural complication. FINDINGS: Sonographic evaluation demonstrates lack of significant dilatation of either the peripheral or central aspect of the intrahepatic biliary tree and despite prolonged efforts, a bile duct was unable to be successfully opacified and/or cannulated. IMPRESSION: Attempted though ultimately  unsuccessful percutaneous cholangiogram and biliary drain placement due to a combination lack significant dilatation the central of peripheral intrahepatic biliary system as well as poor percutaneous window due  to interposition of ribs and the costal cartilage. PLAN: Will recheck patient's bilirubin level tomorrow and will discuss with the providing service the appropriateness of proceeding with repeat attempt versus cholecystostomy tube placement as indicated. Electronically Signed   By: Sandi Mariscal M.D.   On: 09/10/2020 09:57   IR INT EXT BILIARY DRAIN WITH CHOLANGIOGRAM  Result Date: 09/10/2020 INDICATION: History of gastric cancer, now with obstructive jaundice, post failed attempt at ERCP biliary stent placement (due to surgical anatomy) as well as failed attempt percutaneous biliary drainage placement yesterday. As such, patient presents today for repeat attempt at internal/external biliary drainage catheter placement EXAM: ULTRASOUND AND FLUOROSCOPIC GUIDED PERCUTANEOUS TRANSHEPATIC CHOLANGIOGRAM AND BILIARY TUBE PLACEMENT COMPARISON:  Attempted internal/external biliary drainage catheter placement-09/09/2020 CT abdomen and pelvis-earlier same day MRCP-09/04/2020 PET-CT-06/25/2020 MEDICATIONS: Cefoxitin 2 gm IV; The antibiotic was administered with an appropriate time frame prior to the initiation of the procedure CONTRAST:  74mL OMNIPAQUE IOHEXOL 300 MG/ML SOLN - administered into the biliary tree. ANESTHESIA/SEDATION: Moderate (conscious) sedation was employed during this procedure. A total of Versed 2 mg and Fentanyl 100 mcg was administered intravenously. Moderate Sedation Time: 28 minutes. The patient's level of consciousness and vital signs were monitored continuously by radiology nursing throughout the procedure under my direct supervision. FLUOROSCOPY TIME:  4 minutes, 42 seconds (283 mGy close COMPLICATIONS: None immediate. TECHNIQUE: Informed written consent was obtained from the patient after a  discussion of the risks, benefits and alternatives to treatment. Questions regarding the procedure were encouraged and answered. A timeout was performed prior to the initiation of the procedure. The right upper abdominal quadrant was prepped and draped in the usual sterile fashion, and a sterile drape was applied covering the operative field. Maximum barrier sterile technique with sterile gowns and gloves were used for the procedure. A timeout was performed prior to the initiation of the procedure. Exhaustive sonographic evaluation performed of the liver by the dictating interventional radiologist ultimately delineating a mildly dilated duct within the peripheral aspect the left lobe of the liver. After the overlying soft tissues were anesthetized with 1% Lidocaine with epinephrine, under direct ultrasound guidance, a 22 gauge Chiba needle was utilized to cannulate the peripheral aspect of the targeted left intrahepatic biliary duct. Appropriate position was confirmed with limited contrast injection. Next, the duct was cannulated with a Nitrex wire and dilated with an Accustick set under fluoroscopic guidance. Limited cholangiograms were performed in various obliquities confirming appropriate access. Nitrex wire was advanced to the level of the duodenum and exchanged over a 4 French angled glide catheter for a Amplatz wire. Under intermittent fluoroscopic guidance the track was dilated ultimately allowing placement of a 10 [place] biliary drainage catheter with coil ultimately locked within the duodenum. Contrast was injected and a completion radiographs were obtained in various obliquities. The catheter was connected to a drainage bag which yielded the brisk return of clear bile. The catheter was secured to the skin with an interrupted suture and StatLock device. Dressings were applied. The patient tolerated the procedure well without immediate postprocedural complication. FINDINGS: Exhaustive sonographic evaluation  performed by the dictating interventional radiologist ultimately demonstrated mildly dilated duct within the peripheral aspect of the left lobe of the liver which was targeted under direct ultrasound guidance, ultimately allowing fluoroscopic guided placement of a 10 French percutaneous drainage catheter with end ultimately coiled and locked within the duodenum and radiopaque side marker located proximal to the level of the biliary hilum. Contrast injections demonstrate malignant narrowing/subtotal occlusion involving the mid and  distal aspects of the CBD just caudal to the take-off of the cystic duct as was demonstrated on preceding MRCP. Biliary drainage catheter demonstrates opacification both the right and left intrahepatic biliary trees. IMPRESSION: Successful placement of a 10.2 French percutaneous biliary drainage catheter via left-sided approach with end coiled and locked within the duodenum. PLAN: - Recommend obtaining daily CMPs while the patient is admitted to the hospital. - Maintain the percutaneous biliary drainage catheter to external drainage until the patient's bilirubin has reached a nadir. - Once the patient's bilirubin has reached a nadir, the patient may return for definitive percutaneous cholangiogram and potential initiation of a capping trial as indicated. Note, ultimately, the patient may be a candidate for biliary stent placement as indicated. Electronically Signed   By: Sandi Mariscal M.D.   On: 09/10/2020 17:12    Labs:  CBC: Recent Labs    08/20/20 1430 09/04/20 1005 09/10/20 0457 09/11/20 0736  WBC 3.0* 3.7* 4.7 6.2  HGB 10.1* 10.9* 10.6* 11.3*  HCT 30.5* 33.4* 32.2* 33.3*  PLT 168 201 180 182    COAGS: Recent Labs    01/06/20 0915 09/09/20 1900  INR 1.0 1.0  APTT 29  --     BMP: Recent Labs    10/30/19 0900 11/20/19 0900 12/11/19 1000 12/31/19 1056 02/18/20 1025 09/04/20 1005 09/09/20 1900 09/10/20 0457 09/11/20 0736  NA 141 139 141 140   < > 141 141  140  141 138  K 3.5 4.0 4.0 4.0   < > 3.7 3.4* 3.5  3.5 3.7  CL 108 104 107 106   < > 109 107 107  108 102  CO2 27 30 29 28    < > 26 26 29  29 28   GLUCOSE 173* 103* 105* 127*   < > 106* 114* 124*  126* 129*  BUN 12 18 13 16    < > 9 11 10  10 9   CALCIUM 9.2 9.8 9.4 9.4   < > 9.1 9.2 8.8*  8.8* 8.9  CREATININE 0.76 0.90 0.83 0.87   < > 0.76 0.80 0.68  0.52* 0.61  GFRNONAA >60 >60 >60 >60   < > >60 >60 >60  >60 >60  GFRAA >60 >60 >60 >60  --   --   --   --   --    < > = values in this interval not displayed.    LIVER FUNCTION TESTS: Recent Labs    09/04/20 1005 09/09/20 1900 09/10/20 0457 09/11/20 0736  BILITOT 5.6* 10.6* 9.7*  10.1* 5.5*  AST 191* 271* 214*  213* 150*  ALT 245* 300* 253*  249* 208*  ALKPHOS 400* 467* 383*  393* 373*  PROT 6.2* 6.9 6.1*  6.1* 6.3*  ALBUMIN 3.7 3.4* 2.9*  3.0* 3.0*    Assessment and Plan:  Gastric cancer with biliary obstruction now S/p transhepatic internal/external biliary drainage catheter placed 09/10/20   Drain output for the past 24H has been 405 ml with another 150 ml of bilious fluid in gravity bag. Bilirubin has dropped to 5.5 from 10.1 yesterday. Drain easily flushed. Patient reports feeling better with possible discharge home today or tomorrow. Outpatient orders will be placed for the patient to be seen at either [facility] or [facility] next Thursday or Friday (June 16 or June 17) for a CMP and cholangiogram to evaluate for possible capping trial. Long term plan is to hopefully get a stent placed.   Patient and his wife were educated at the  bedside on drain care/site care, flushing drain, documenting output. They were instructed to flush the drain once daily with 5 ml NS and keep the site clean and dry. They will need saline flushes at discharge. They know they can call our office with any questions prior to their follow up.   Other plans per primary teams.   Electronically Signed: Soyla Dryer,  AGACNP-BC [phone] 09/11/2020, 4:52 PM   I spent a total of 15 Minutes at the the patient's bedside AND on the patient's hospital floor or unit, greater than 50% of which was counseling/coordinating care for biliary drain.

## 2020-09-11 NOTE — Progress Notes (Signed)
PROGRESS NOTE    SHANKAR SILBER  IRS:[id number] DOB: 1940-05-23 DOA: 09/09/2020 PCP: Biagio Borg, MD  Brief Narrative: 80 year old male with history of metastatic gastric cancer, on chemotherapy followed by Dr. Marin Olp, was recently noted to have abnormal LFTs, and further work-up with MRCP was noted to have diffuse biliary ductal dilation and soft tissue mass, he was subsequently set up to see gastroenterology and underwent an ERCP on 6/8 to attempt biliary stent placement, this was attempted and unsuccessful, subsequently IR was consulted for presumed percutaneous biliary drain placement, this was also attempted 6/8 evening and unsuccessful, admitted overnight for observation and repeat attempts.   Subjective -Complains of abdominal bloating, otherwise feels okay, reports ongoing constipation, denies nausea or vomiting   Assessment & Plan:   Biliary obstruction     -MRCP noted diffuse biliary ductal dilation due to soft tissue mass in the porta hepatis adjacent to pancreatic head likely represents metastatic lymphadenopathy in addition has suspected pancreatic mass and peritoneal metastasis as well  -Gastroenterology following, and underwent an unsuccessful ERCP attempt 6/8 followed by failed PERC biliary drain placement -6/9 unsuccessful, PERC transhepatic biliary drain placed in IR -Bilirubin improving -Discharge planning -Laxatives for constipation, continue lactulose, add Dulcolax suppository  Metastatic gastric cancer -Followed by Dr. Marin Olp, recent MRCP suggests disease progression, liver mets and new peritoneal metastasis -Chemotherapy on hold -I would imagine his prognosis is guarded   Chronic iron deficiency anemia     -Hemoglobin is stable   GERD     - protonix   HLD     - continue statin   [place]     - continue home regimen   Hx of DM2     - diet controlled     - last A1c was 6.6 on 04/22/20   DVT prophylaxis: SCDs  Code Status: FULL  Family  Communication: Called and discussed with spouse Disposition Plan:  Status is: inpatient   Inpatient level of care appropriate due to severity of illness  Dispo: The patient is from: Home              Anticipated d/c is to: Home              Patient currently is not medically stable to d/c.   Difficult to place patient No    Consultants:  Gastroenterology, IR  Procedures: Percutaneous transhepatic 57 French biliary drainage catheter placed in IR Dr. Pascal Lux 6/9  Antimicrobials:    Subjective:   Objective: Vitals:   09/10/20 1816 09/10/20 2129 09/11/20 0220 09/11/20 0953  BP: (!) 156/85 (!) 144/82 (!) 142/82 (!) 162/89  Pulse: (!) 57 63 65 61  Resp: 18 18 16 19   Temp: 97.8 F (36.6 C) 98.8 F (37.1 C) 98.4 F (36.9 C) 98.6 F (37 C)  TempSrc: Oral Oral Oral Oral  SpO2: 100% 100% 100% 100%  Weight:      Height:        Intake/Output Summary (Last 24 hours) at 09/11/2020 1257 Last data filed at 09/11/2020 0093 Gross per 24 hour  Intake 2184.7 ml  Output 2705 ml  Net -520.3 ml   Filed Weights   09/09/20 1009  Weight: 84.3 kg    Examination:  General exam: Elderly pleasant male, sitting up in bed, awake alert oriented x3, nondistress HEENT: Port-A-Cath noted, positive scleral icterus CVS: S1-S2, regular rate rhythm Lungs: Clear bilaterally Abdomen: Soft, nontender, hepatomegaly noted, bowel sounds present, biliary drain noted Extremities: No edema  Skin: No rashes Psychiatry: Judgement  and insight appear normal. Mood & affect appropriate.     Data Reviewed:   CBC: Recent Labs  Lab 09/10/20 0457 09/11/20 0736  WBC 4.7 6.2  NEUTROABS  --  4.4  HGB 10.6* 11.3*  HCT 32.2* 33.3*  MCV 88.2 86.5  PLT 180 809   Basic Metabolic Panel: Recent Labs  Lab 09/09/20 1900 09/10/20 0457 09/11/20 0736  NA 141 140  141 138  K 3.4* 3.5  3.5 3.7  CL 107 107  108 102  CO2 26 29  29 28   GLUCOSE 114* 124*  126* 129*  BUN 11 10  10 9   CREATININE 0.80 0.68   0.52* 0.61  CALCIUM 9.2 8.8*  8.8* 8.9   GFR: Estimated Creatinine Clearance: 76.6 mL/min (by C-G formula based on SCr of 0.61 mg/dL). Liver Function Tests: Recent Labs  Lab 09/09/20 1900 09/10/20 0457 09/11/20 0736  AST 271* 214*  213* 150*  ALT 300* 253*  249* 208*  ALKPHOS 467* 383*  393* 373*  BILITOT 10.6* 9.7*  10.1* 5.5*  PROT 6.9 6.1*  6.1* 6.3*  ALBUMIN 3.4* 2.9*  3.0* 3.0*   No results for input(s): LIPASE, AMYLASE in the last 168 hours. No results for input(s): AMMONIA in the last 168 hours. Coagulation Profile: Recent Labs  Lab 09/09/20 1900  INR 1.0   Cardiac Enzymes: No results for input(s): CKTOTAL, CKMB, CKMBINDEX, TROPONINI in the last 168 hours. BNP (last 3 results) No results for input(s): PROBNP in the last 8760 hours. HbA1C: Recent Labs    09/10/20 0457  HGBA1C 6.1*   CBG: No results for input(s): GLUCAP in the last 168 hours. Lipid Profile: No results for input(s): CHOL, HDL, LDLCALC, TRIG, CHOLHDL, LDLDIRECT in the last 72 hours. Thyroid Function Tests: No results for input(s): TSH, T4TOTAL, FREET4, T3FREE, THYROIDAB in the last 72 hours. Anemia Panel: No results for input(s): VITAMINB12, FOLATE, FERRITIN, TIBC, IRON, RETICCTPCT in the last 72 hours. Urine analysis:    Component Value Date/Time   COLORURINE YELLOW 04/22/2020 [place] 04/22/2020 1246   LABSPEC 1.025 04/22/2020 1246   PHURINE 5.5 04/22/2020 1246   GLUCOSEU NEGATIVE 04/22/2020 1246   HGBUR NEGATIVE 04/22/2020 1246   HGBUR negative 11/30/2009 1450   BILIRUBINUR NEGATIVE 04/22/2020 [place] 04/22/2020 1246   PROTEINUR NEGATIVE 09/04/2020 1006   UROBILINOGEN 0.2 04/22/2020 1246   NITRITE NEGATIVE 04/22/2020 1246   LEUKOCYTESUR NEGATIVE 04/22/2020 1246   Sepsis Labs: @LABRCNTIP (procalcitonin:4,lacticidven:4)  ) Recent Results (from the past 240 hour(s))  SARS CORONAVIRUS 2 (TAT 6-24 HRS) Nasopharyngeal Nasopharyngeal Swab      Status: None   Collection Time: 09/10/20  2:30 AM   Specimen: Nasopharyngeal Swab  Result Value Ref Range Status   SARS Coronavirus 2 NEGATIVE NEGATIVE Final    Comment: (NOTE) SARS-CoV-2 target nucleic acids are NOT DETECTED.  The SARS-CoV-2 RNA is generally detectable in upper and lower respiratory specimens during the acute phase of infection. Negative results do not preclude SARS-CoV-2 infection, do not rule out co-infections with other pathogens, and should not be used as the sole basis for treatment or other patient management decisions. Negative results must be combined with clinical observations, patient history, and epidemiological information. The expected result is Negative.  Fact Sheet for Patients: SugarRoll.be  Fact Sheet for Healthcare Providers: https://www.woods-mathews.com/  This test is not yet approved or cleared by the [place] FDA and  has been authorized for detection and/or diagnosis of SARS-CoV-2 by FDA under  an Emergency Use Authorization (EUA). This EUA will remain  in effect (meaning this test can be used) for the duration of the COVID-19 declaration under Se ction 564(b)(1) of the Act, 21 U.S.C. section 360bbb-3(b)(1), unless the authorization is terminated or revoked sooner.  Performed at [facility], [place] [address], [place], [place] [address]          Radiology Studies: CT ABDOMEN PELVIS W CONTRAST  Result Date: 09/11/2020 CLINICAL DATA:  History of gastric cancer, now with elevated bilirubin post failed attempt at endoscopic biliary stent placement as well as percutaneous biliary drainage catheter placement. Please perform abdominal CT for potential repeat attempt at internal/external biliary drainage catheter placement. EXAM: CT ABDOMEN AND PELVIS WITH CONTRAST TECHNIQUE: Multidetector CT imaging of the abdomen and pelvis was performed using the standard protocol following bolus  administration of intravenous contrast. CONTRAST:  136mL OMNIPAQUE IOHEXOL 300 MG/ML  SOLN COMPARISON:  MRCP-09/04/2020; ERCP-09/09/2020; PET-CT-06/25/2020 FINDINGS: Lower chest: Limited visualization of the lower thorax demonstrates minimal dependent subpleural ground-glass atelectasis, right greater than left. There is a punctate granuloma within the right middle lobe. No discrete focal airspace opacities. Borderline cardiomegaly. No pericardial effusion. Port a catheter tip terminates within the superior cavoatrial junction. Hepatobiliary: Redemonstrated marked distension of the gallbladder with very minimal intrahepatic biliary ductal dilatation, similar to abdominal MRCP performed 09/04/2020. No definitive gallbladder wall thickening or pericholecystic stranding. Scattered foci air at the level of the porta hepatis and minimal amount of ill-defined stranding adjacent to the anterior aspect the left lobe of the liver and right lateral body wall, the result recent attempted percutaneous biliary drainage catheter placement. Approximately 1.2 cm nodule adjacent to the anterior aspect the lobe liver image 11, series 7 similar to recent abdominal MRI and again favored to represent a hepatic capsular implant. There is a punctate (approximately 0.6 cm) hypoattenuating lesion with the dome of the right lobe of the liver (image 11, series 2, which is too small to accurately characterize favored to represent a hepatic cyst. Additional subcentimeter hypoattenuating hepatic lesions (images 21 and 31, series 2) too small to accurately characterize remain indeterminate. Pancreas: Previously question hypovascular involving the pancreatic body on recent abdominal MRI is not well demonstrated the present examination. No definitive downstream pancreatic ductal dilatation or peripancreatic stranding. Spleen: Normal appearance of the spleen. Adrenals/Urinary Tract: There is symmetric enhancement and excretion of the bilateral  kidneys. There is a punctate (approximately 5 mm) nonobstructing stone within the inferior pole the right kidney image 34, series 2). No discrete left-sided renal stones on this postcontrast examination. No renal stones are seen along expected course of either ureter or the urinary bladder. Mild thickening of the urinary bladder wall, potentially accentuated due to underdistention. Redemonstrated small left-sided extrarenal pelvis. No evidence of urinary obstruction. Normal appearance the bilateral adrenal glands. Stomach/Bowel: Stable postoperative change the stomach compatible with history partial gastric resection. Moderate colonic stool burden without evidence of enteric obstruction. Scattered colonic diverticulosis without evidence of superimposed acute diverticulitis. Normal appearance of the terminal ileum and the retrocecal appendix. No discrete areas of bowel wall thickening. No pneumoperitoneum, pneumatosis or portal venous gas. Vascular/Lymphatic: Normal caliber of the aorta. There is a very minimal amount of involving the bilateral common iliac and femoral arteries, not resulting in hemodynamically significant stenosis. The major branch vessels the aorta appear patent on this non CTA examination. No bulky retroperitoneal, pelvic or inguinal lymphadenopathy. Reproductive: Dystrophic calcifications within a borderline enlarged prostate gland. No free fluid the pelvic cul-de-sac. Other:  Redemonstrated scattered peritoneal nodules with dominant nodule within the right upper abdominal quadrant measuring 1.4 x 1.1 cm (image 38, series 2). Additional peritoneal nodules are seen on images 27, 38, 43, 46 and 47, series 2. Musculoskeletal: No acute or aggressive osseous abnormalities. Redemonstrated bilateral L5 pars defects associated anterolisthesis. Mild-to-moderate multilevel lumbar spine DDD, worse at L4-L5 with disc space height loss, endplate irregularity and posterior directed osteophytosis. Mild  degenerative change the hips with joint space loss, subchondral sclerosis and osteophytosis. No evidence. IMPRESSION: 1. Marked distension of the gallbladder with minimal intrahepatic biliary ductal dilatation, similar to MRCP performed 09/04/2020. 2. Redemonstrated suspected peritoneal metastatic disease including a subcapsular implant adjacent to the left lobe of the liver. 3. Previously questioned pancreatic head mass is not well demonstrated on the present examination though resolution should not assumed on the basis of this examination. 4. Similar appearing indeterminate hepatic lesions at least one of which was found to be hypermetabolic on PET-CT performed 06/25/2020. 5. Solitary punctate (5 mm) nonobstructing right-sided renal stone. Electronically Signed   By: Sandi Mariscal M.D.   On: 09/11/2020 08:29   DG ERCP  Result Date: 09/09/2020 CLINICAL DATA:  Attempted ERCP EXAM: ERCP TECHNIQUE: Multiple spot images obtained with the fluoroscopic device and submitted for interpretation post-procedure. FLUOROSCOPY TIME:  Fluoroscopy Time:  3 minutes 38 seconds Number of Acquired Spot Images: 0 COMPARISON:  MRCP 09/04/2020 FINDINGS: A total of 20 intraoperative saved images are submitted for review. The images demonstrate a flexible duodenal scope in various positions within the duodenum and proximal jejunum. No evidence of immediate complication. IMPRESSION: Attempted ERCP. These images were submitted for radiologic interpretation only. Please see the procedural report for the amount of contrast and the fluoroscopy time utilized. Electronically Signed   By: Jacqulynn Cadet M.D.   On: 09/09/2020 14:47   IR BILIARY DRAIN PLACEMENT WITH CHOLANGIOGRAM  Result Date: 09/10/2020 INDICATION: History of gastric cancer, now with obstructive jaundice, post failed attempt at ERCP biliary stent placement. EXAM: ATTEMPTED THOUGH ULTIMATELY UNSUCCESSFUL ULTRASOUND AND FLUOROSCOPIC GUIDED PERCUTANEOUS TRANSHEPATIC  CHOLANGIOGRAM AND BILIARY TUBE PLACEMENT COMPARISON:  MRCP-09/04/2020; PET-CT-06/26/2018 MEDICATIONS: Cefoxitin 2 gm IV; The antibiotic was administered with an appropriate time frame prior to the initiation of the procedure CONTRAST:  45 mL OMNIPAQUE IOHEXOL 300 MG/ML  SOLN ANESTHESIA/SEDATION: Moderate (conscious) sedation was employed during this procedure. A total of Versed 3 mg and Fentanyl 100 mcg was administered intravenously. Moderate Sedation Time: 88 minutes. The patient's level of consciousness and vital signs were monitored continuously by radiology nursing throughout the procedure under my direct supervision. FLUOROSCOPY TIME:  10 minutes, 54 seconds (161 mGy) COMPLICATIONS: None immediate. TECHNIQUE: Informed written consent was obtained from the patient after a discussion of the risks, benefits and alternatives to treatment. Questions regarding the procedure were encouraged and answered. A timeout was performed prior to the initiation of the procedure. The right upper abdominal quadrant was prepped and draped in the usual sterile fashion, and a sterile drape was applied covering the operative field. Maximum barrier sterile technique with sterile gowns and gloves were used for the procedure. A timeout was performed prior to the initiation of the procedure. Sonographic evaluation was performed the liver demonstrating only a small amount of peripheral and central intrahepatic biliary duct dilatation with marked distension of gallbladder. Prolonged efforts were made to cannulate a peripherally located bile duct within both the right and left lobes of the liver however despite prolonged effort, a bile duct was unable to be successfully cannulated catheterized  either with ultrasound or fluoroscopic guidance secondary to lack of biliary ductal dilatation as well as poor percutaneous window due to interposition the ribs and the costal cartilage. As such the procedure was terminated. Dressings were applied.  The patient tolerated the procedure well without immediate postprocedural complication. FINDINGS: Sonographic evaluation demonstrates lack of significant dilatation of either the peripheral or central aspect of the intrahepatic biliary tree and despite prolonged efforts, a bile duct was unable to be successfully opacified and/or cannulated. IMPRESSION: Attempted though ultimately unsuccessful percutaneous cholangiogram and biliary drain placement due to a combination lack significant dilatation the central of peripheral intrahepatic biliary system as well as poor percutaneous window due to interposition of ribs and the costal cartilage. PLAN: Will recheck patient's bilirubin level tomorrow and will discuss with the providing service the appropriateness of proceeding with repeat attempt versus cholecystostomy tube placement as indicated. Electronically Signed   By: Sandi Mariscal M.D.   On: 09/10/2020 09:57   IR INT EXT BILIARY DRAIN WITH CHOLANGIOGRAM  Result Date: 09/10/2020 INDICATION: History of gastric cancer, now with obstructive jaundice, post failed attempt at ERCP biliary stent placement (due to surgical anatomy) as well as failed attempt percutaneous biliary drainage placement yesterday. As such, patient presents today for repeat attempt at internal/external biliary drainage catheter placement EXAM: ULTRASOUND AND FLUOROSCOPIC GUIDED PERCUTANEOUS TRANSHEPATIC CHOLANGIOGRAM AND BILIARY TUBE PLACEMENT COMPARISON:  Attempted internal/external biliary drainage catheter placement-09/09/2020 CT abdomen and pelvis-earlier same day MRCP-09/04/2020 PET-CT-06/25/2020 MEDICATIONS: Cefoxitin 2 gm IV; The antibiotic was administered with an appropriate time frame prior to the initiation of the procedure CONTRAST:  55mL OMNIPAQUE IOHEXOL 300 MG/ML SOLN - administered into the biliary tree. ANESTHESIA/SEDATION: Moderate (conscious) sedation was employed during this procedure. A total of Versed 2 mg and Fentanyl 100 mcg  was administered intravenously. Moderate Sedation Time: 28 minutes. The patient's level of consciousness and vital signs were monitored continuously by radiology nursing throughout the procedure under my direct supervision. FLUOROSCOPY TIME:  4 minutes, 42 seconds (355 mGy close COMPLICATIONS: None immediate. TECHNIQUE: Informed written consent was obtained from the patient after a discussion of the risks, benefits and alternatives to treatment. Questions regarding the procedure were encouraged and answered. A timeout was performed prior to the initiation of the procedure. The right upper abdominal quadrant was prepped and draped in the usual sterile fashion, and a sterile drape was applied covering the operative field. Maximum barrier sterile technique with sterile gowns and gloves were used for the procedure. A timeout was performed prior to the initiation of the procedure. Exhaustive sonographic evaluation performed of the liver by the dictating interventional radiologist ultimately delineating a mildly dilated duct within the peripheral aspect the left lobe of the liver. After the overlying soft tissues were anesthetized with 1% Lidocaine with epinephrine, under direct ultrasound guidance, a 22 gauge Chiba needle was utilized to cannulate the peripheral aspect of the targeted left intrahepatic biliary duct. Appropriate position was confirmed with limited contrast injection. Next, the duct was cannulated with a Nitrex wire and dilated with an Accustick set under fluoroscopic guidance. Limited cholangiograms were performed in various obliquities confirming appropriate access. Nitrex wire was advanced to the level of the duodenum and exchanged over a 4 French angled glide catheter for a Amplatz wire. Under intermittent fluoroscopic guidance the track was dilated ultimately allowing placement of a 10 [place] biliary drainage catheter with coil ultimately locked within the duodenum. Contrast was injected and a  completion radiographs were obtained in various obliquities. The catheter was connected to  a drainage bag which yielded the brisk return of clear bile. The catheter was secured to the skin with an interrupted suture and StatLock device. Dressings were applied. The patient tolerated the procedure well without immediate postprocedural complication. FINDINGS: Exhaustive sonographic evaluation performed by the dictating interventional radiologist ultimately demonstrated mildly dilated duct within the peripheral aspect of the left lobe of the liver which was targeted under direct ultrasound guidance, ultimately allowing fluoroscopic guided placement of a 10 French percutaneous drainage catheter with end ultimately coiled and locked within the duodenum and radiopaque side marker located proximal to the level of the biliary hilum. Contrast injections demonstrate malignant narrowing/subtotal occlusion involving the mid and distal aspects of the CBD just caudal to the take-off of the cystic duct as was demonstrated on preceding MRCP. Biliary drainage catheter demonstrates opacification both the right and left intrahepatic biliary trees. IMPRESSION: Successful placement of a 10.2 French percutaneous biliary drainage catheter via left-sided approach with end coiled and locked within the duodenum. PLAN: - Recommend obtaining daily CMPs while the patient is admitted to the hospital. - Maintain the percutaneous biliary drainage catheter to external drainage until the patient's bilirubin has reached a nadir. - Once the patient's bilirubin has reached a nadir, the patient may return for definitive percutaneous cholangiogram and potential initiation of a capping trial as indicated. Note, ultimately, the patient may be a candidate for biliary stent placement as indicated. Electronically Signed   By: Sandi Mariscal M.D.   On: 09/10/2020 17:12     Scheduled Meds:  atorvastatin  20 mg Oral Daily   bisacodyl  10 mg Rectal Once    Chlorhexidine Gluconate Cloth  6 each Topical Daily   dorzolamide-timolol  1 drop Both Eyes BID   famotidine  20 mg Oral Daily   lactulose  20 g Oral TID   latanoprost  1 drop Both Eyes QHS   meclizine  12.5 mg Oral Daily   multivitamin with minerals  1 tablet Oral Daily   sodium chloride flush  5 mL Intracatheter Q8H   Continuous Infusions:     LOS: 0 days    Time spent: 2min  Domenic Polite, MD [facility]   09/11/2020, 12:57 PM

## 2020-09-11 NOTE — Progress Notes (Addendum)
Progress Note   Subjective  Chief Complaint: Biliary obstruction  Status post biliary drain placement 09/10/2020.  This morning patient is doing well, denies any discomfort from drain.  Tells me the only thing he has battling is constipation for which the nurse has just given him Lactulose.  He is asking when he can go home.   Objective   Vital signs in last 24 hours: Temp:  [97.8 F (36.6 C)-98.8 F (37.1 C)] 98.6 F (37 C) (06/10 0953) Pulse Rate:  [54-65] 61 (06/10 0953) Resp:  [8-19] 19 (06/10 0953) BP: (123-173)/(79-89) 162/89 (06/10 0953) SpO2:  [100 %] 100 % (06/10 0953) Last BM Date: 09/09/20 General: AA male in NAD Heart:  Regular rate and rhythm; no murmurs Lungs: Respirations even and unlabored, lungs CTA bilaterally Abdomen:  Soft, nontender and nondistended. Normal bowel sounds.+drain with 25 cc bilious output Extremities:  Without edema. Neurologic:  Alert and oriented,  grossly normal neurologically. Psych:  Cooperative. Normal mood and affect.  Intake/Output from previous day: 06/09 0701 - 06/10 0700 In: 2671.1 [P.O.:720; I.V.:1951.1] Out: 2905 [Urine:2500; Drains:405] Intake/Output this shift: Total I/O In: -  Out: 100 [Drains:100]  Lab Results: Recent Labs    09/10/20 0457 09/11/20 0736  WBC 4.7 6.2  HGB 10.6* 11.3*  HCT 32.2* 33.3*  PLT 180 182   BMET Recent Labs    09/09/20 1900 09/10/20 0457 09/11/20 0736  NA 141 140  141 138  K 3.4* 3.5  3.5 3.7  CL 107 107  108 102  CO2 26 29  29 28   GLUCOSE 114* 124*  126* 129*  BUN 11 10  10 9   CREATININE 0.80 0.68  0.52* 0.61  CALCIUM 9.2 8.8*  8.8* 8.9   LFT Recent Labs    09/11/20 0736  PROT 6.3*  ALBUMIN 3.0*  AST 150*  ALT 208*  ALKPHOS 373*  BILITOT 5.5*   PT/INR Recent Labs    09/09/20 1900  LABPROT 13.2  INR 1.0    Studies/Results: CT ABDOMEN PELVIS W CONTRAST  Result Date: 09/11/2020 CLINICAL DATA:  History of gastric cancer, now with elevated bilirubin  post failed attempt at endoscopic biliary stent placement as well as percutaneous biliary drainage catheter placement. Please perform abdominal CT for potential repeat attempt at internal/external biliary drainage catheter placement. EXAM: CT ABDOMEN AND PELVIS WITH CONTRAST TECHNIQUE: Multidetector CT imaging of the abdomen and pelvis was performed using the standard protocol following bolus administration of intravenous contrast. CONTRAST:  135mL OMNIPAQUE IOHEXOL 300 MG/ML  SOLN COMPARISON:  MRCP-09/04/2020; ERCP-09/09/2020; PET-CT-06/25/2020 FINDINGS: Lower chest: Limited visualization of the lower thorax demonstrates minimal dependent subpleural ground-glass atelectasis, right greater than left. There is a punctate granuloma within the right middle lobe. No discrete focal airspace opacities. Borderline cardiomegaly. No pericardial effusion. Port a catheter tip terminates within the superior cavoatrial junction. Hepatobiliary: Redemonstrated marked distension of the gallbladder with very minimal intrahepatic biliary ductal dilatation, similar to abdominal MRCP performed 09/04/2020. No definitive gallbladder wall thickening or pericholecystic stranding. Scattered foci air at the level of the porta hepatis and minimal amount of ill-defined stranding adjacent to the anterior aspect the left lobe of the liver and right lateral body wall, the result recent attempted percutaneous biliary drainage catheter placement. Approximately 1.2 cm nodule adjacent to the anterior aspect the lobe liver image 11, series 7 similar to recent abdominal MRI and again favored to represent a hepatic capsular implant. There is a punctate (approximately 0.6 cm) hypoattenuating lesion with the dome of  the right lobe of the liver (image 11, series 2, which is too small to accurately characterize favored to represent a hepatic cyst. Additional subcentimeter hypoattenuating hepatic lesions (images 21 and 31, series 2) too small to accurately  characterize remain indeterminate. Pancreas: Previously question hypovascular involving the pancreatic body on recent abdominal MRI is not well demonstrated the present examination. No definitive downstream pancreatic ductal dilatation or peripancreatic stranding. Spleen: Normal appearance of the spleen. Adrenals/Urinary Tract: There is symmetric enhancement and excretion of the bilateral kidneys. There is a punctate (approximately 5 mm) nonobstructing stone within the inferior pole the right kidney image 34, series 2). No discrete left-sided renal stones on this postcontrast examination. No renal stones are seen along expected course of either ureter or the urinary bladder. Mild thickening of the urinary bladder wall, potentially accentuated due to underdistention. Redemonstrated small left-sided extrarenal pelvis. No evidence of urinary obstruction. Normal appearance the bilateral adrenal glands. Stomach/Bowel: Stable postoperative change the stomach compatible with history partial gastric resection. Moderate colonic stool burden without evidence of enteric obstruction. Scattered colonic diverticulosis without evidence of superimposed acute diverticulitis. Normal appearance of the terminal ileum and the retrocecal appendix. No discrete areas of bowel wall thickening. No pneumoperitoneum, pneumatosis or portal venous gas. Vascular/Lymphatic: Normal caliber of the aorta. There is a very minimal amount of involving the bilateral common iliac and femoral arteries, not resulting in hemodynamically significant stenosis. The major branch vessels the aorta appear patent on this non CTA examination. No bulky retroperitoneal, pelvic or inguinal lymphadenopathy. Reproductive: Dystrophic calcifications within a borderline enlarged prostate gland. No free fluid the pelvic cul-de-sac. Other: Redemonstrated scattered peritoneal nodules with dominant nodule within the right upper abdominal quadrant measuring 1.4 x 1.1 cm (image  38, series 2). Additional peritoneal nodules are seen on images 27, 38, 43, 46 and 47, series 2. Musculoskeletal: No acute or aggressive osseous abnormalities. Redemonstrated bilateral L5 pars defects associated anterolisthesis. Mild-to-moderate multilevel lumbar spine DDD, worse at L4-L5 with disc space height loss, endplate irregularity and posterior directed osteophytosis. Mild degenerative change the hips with joint space loss, subchondral sclerosis and osteophytosis. No evidence. IMPRESSION: 1. Marked distension of the gallbladder with minimal intrahepatic biliary ductal dilatation, similar to MRCP performed 09/04/2020. 2. Redemonstrated suspected peritoneal metastatic disease including a subcapsular implant adjacent to the left lobe of the liver. 3. Previously questioned pancreatic head mass is not well demonstrated on the present examination though resolution should not assumed on the basis of this examination. 4. Similar appearing indeterminate hepatic lesions at least one of which was found to be hypermetabolic on PET-CT performed 06/25/2020. 5. Solitary punctate (5 mm) nonobstructing right-sided renal stone. Electronically Signed   By: Sandi Mariscal M.D.   On: 09/11/2020 08:29   DG ERCP  Result Date: 09/09/2020 CLINICAL DATA:  Attempted ERCP EXAM: ERCP TECHNIQUE: Multiple spot images obtained with the fluoroscopic device and submitted for interpretation post-procedure. FLUOROSCOPY TIME:  Fluoroscopy Time:  3 minutes 38 seconds Number of Acquired Spot Images: 0 COMPARISON:  MRCP 09/04/2020 FINDINGS: A total of 20 intraoperative saved images are submitted for review. The images demonstrate a flexible duodenal scope in various positions within the duodenum and proximal jejunum. No evidence of immediate complication. IMPRESSION: Attempted ERCP. These images were submitted for radiologic interpretation only. Please see the procedural report for the amount of contrast and the fluoroscopy time utilized.  Electronically Signed   By: Jacqulynn Cadet M.D.   On: 09/09/2020 14:47   IR BILIARY DRAIN PLACEMENT WITH CHOLANGIOGRAM  Result  Date: 09/10/2020 INDICATION: History of gastric cancer, now with obstructive jaundice, post failed attempt at ERCP biliary stent placement. EXAM: ATTEMPTED THOUGH ULTIMATELY UNSUCCESSFUL ULTRASOUND AND FLUOROSCOPIC GUIDED PERCUTANEOUS TRANSHEPATIC CHOLANGIOGRAM AND BILIARY TUBE PLACEMENT COMPARISON:  MRCP-09/04/2020; PET-CT-06/26/2018 MEDICATIONS: Cefoxitin 2 gm IV; The antibiotic was administered with an appropriate time frame prior to the initiation of the procedure CONTRAST:  45 mL OMNIPAQUE IOHEXOL 300 MG/ML  SOLN ANESTHESIA/SEDATION: Moderate (conscious) sedation was employed during this procedure. A total of Versed 3 mg and Fentanyl 100 mcg was administered intravenously. Moderate Sedation Time: 88 minutes. The patient's level of consciousness and vital signs were monitored continuously by radiology nursing throughout the procedure under my direct supervision. FLUOROSCOPY TIME:  10 minutes, 54 seconds (546 mGy) COMPLICATIONS: None immediate. TECHNIQUE: Informed written consent was obtained from the patient after a discussion of the risks, benefits and alternatives to treatment. Questions regarding the procedure were encouraged and answered. A timeout was performed prior to the initiation of the procedure. The right upper abdominal quadrant was prepped and draped in the usual sterile fashion, and a sterile drape was applied covering the operative field. Maximum barrier sterile technique with sterile gowns and gloves were used for the procedure. A timeout was performed prior to the initiation of the procedure. Sonographic evaluation was performed the liver demonstrating only a small amount of peripheral and central intrahepatic biliary duct dilatation with marked distension of gallbladder. Prolonged efforts were made to cannulate a peripherally located bile duct within both the  right and left lobes of the liver however despite prolonged effort, a bile duct was unable to be successfully cannulated catheterized either with ultrasound or fluoroscopic guidance secondary to lack of biliary ductal dilatation as well as poor percutaneous window due to interposition the ribs and the costal cartilage. As such the procedure was terminated. Dressings were applied. The patient tolerated the procedure well without immediate postprocedural complication. FINDINGS: Sonographic evaluation demonstrates lack of significant dilatation of either the peripheral or central aspect of the intrahepatic biliary tree and despite prolonged efforts, a bile duct was unable to be successfully opacified and/or cannulated. IMPRESSION: Attempted though ultimately unsuccessful percutaneous cholangiogram and biliary drain placement due to a combination lack significant dilatation the central of peripheral intrahepatic biliary system as well as poor percutaneous window due to interposition of ribs and the costal cartilage. PLAN: Will recheck patient's bilirubin level tomorrow and will discuss with the providing service the appropriateness of proceeding with repeat attempt versus cholecystostomy tube placement as indicated. Electronically Signed   By: Sandi Mariscal M.D.   On: 09/10/2020 09:57   IR INT EXT BILIARY DRAIN WITH CHOLANGIOGRAM  Result Date: 09/10/2020 INDICATION: History of gastric cancer, now with obstructive jaundice, post failed attempt at ERCP biliary stent placement (due to surgical anatomy) as well as failed attempt percutaneous biliary drainage placement yesterday. As such, patient presents today for repeat attempt at internal/external biliary drainage catheter placement EXAM: ULTRASOUND AND FLUOROSCOPIC GUIDED PERCUTANEOUS TRANSHEPATIC CHOLANGIOGRAM AND BILIARY TUBE PLACEMENT COMPARISON:  Attempted internal/external biliary drainage catheter placement-09/09/2020 CT abdomen and pelvis-earlier same day  MRCP-09/04/2020 PET-CT-06/25/2020 MEDICATIONS: Cefoxitin 2 gm IV; The antibiotic was administered with an appropriate time frame prior to the initiation of the procedure CONTRAST:  32mL OMNIPAQUE IOHEXOL 300 MG/ML SOLN - administered into the biliary tree. ANESTHESIA/SEDATION: Moderate (conscious) sedation was employed during this procedure. A total of Versed 2 mg and Fentanyl 100 mcg was administered intravenously. Moderate Sedation Time: 28 minutes. The patient's level of consciousness and vital signs were monitored  continuously by radiology nursing throughout the procedure under my direct supervision. FLUOROSCOPY TIME:  4 minutes, 42 seconds (591 mGy close COMPLICATIONS: None immediate. TECHNIQUE: Informed written consent was obtained from the patient after a discussion of the risks, benefits and alternatives to treatment. Questions regarding the procedure were encouraged and answered. A timeout was performed prior to the initiation of the procedure. The right upper abdominal quadrant was prepped and draped in the usual sterile fashion, and a sterile drape was applied covering the operative field. Maximum barrier sterile technique with sterile gowns and gloves were used for the procedure. A timeout was performed prior to the initiation of the procedure. Exhaustive sonographic evaluation performed of the liver by the dictating interventional radiologist ultimately delineating a mildly dilated duct within the peripheral aspect the left lobe of the liver. After the overlying soft tissues were anesthetized with 1% Lidocaine with epinephrine, under direct ultrasound guidance, a 22 gauge Chiba needle was utilized to cannulate the peripheral aspect of the targeted left intrahepatic biliary duct. Appropriate position was confirmed with limited contrast injection. Next, the duct was cannulated with a Nitrex wire and dilated with an Accustick set under fluoroscopic guidance. Limited cholangiograms were performed in various  obliquities confirming appropriate access. Nitrex wire was advanced to the level of the duodenum and exchanged over a 4 French angled glide catheter for a Amplatz wire. Under intermittent fluoroscopic guidance the track was dilated ultimately allowing placement of a 10 [place] biliary drainage catheter with coil ultimately locked within the duodenum. Contrast was injected and a completion radiographs were obtained in various obliquities. The catheter was connected to a drainage bag which yielded the brisk return of clear bile. The catheter was secured to the skin with an interrupted suture and StatLock device. Dressings were applied. The patient tolerated the procedure well without immediate postprocedural complication. FINDINGS: Exhaustive sonographic evaluation performed by the dictating interventional radiologist ultimately demonstrated mildly dilated duct within the peripheral aspect of the left lobe of the liver which was targeted under direct ultrasound guidance, ultimately allowing fluoroscopic guided placement of a 10 French percutaneous drainage catheter with end ultimately coiled and locked within the duodenum and radiopaque side marker located proximal to the level of the biliary hilum. Contrast injections demonstrate malignant narrowing/subtotal occlusion involving the mid and distal aspects of the CBD just caudal to the take-off of the cystic duct as was demonstrated on preceding MRCP. Biliary drainage catheter demonstrates opacification both the right and left intrahepatic biliary trees. IMPRESSION: Successful placement of a 10.2 French percutaneous biliary drainage catheter via left-sided approach with end coiled and locked within the duodenum. PLAN: - Recommend obtaining daily CMPs while the patient is admitted to the hospital. - Maintain the percutaneous biliary drainage catheter to external drainage until the patient's bilirubin has reached a nadir. - Once the patient's bilirubin has reached a  nadir, the patient may return for definitive percutaneous cholangiogram and potential initiation of a capping trial as indicated. Note, ultimately, the patient may be a candidate for biliary stent placement as indicated. Electronically Signed   By: Sandi Mariscal M.D.   On: 09/10/2020 17:12     Assessment / Plan:    Assessment: 1.  History of stage IIIa Adenocarcinoma of the stomach: Status post distal gastrectomy December 2020, now with metastatic disease getting Taxol, was on Cyramza but recently held due to GI bleeding, followed by Dr. Marin Olp 2.  Obstructive jaundice: MRCP showed diffuse biliary duct dilation with a soft tissue mass in the porta hepatis,  likely metastatic, ERCP 09/09/20 and the bile duct could not be cannulated, IR placed PVD 09/10/2020  Plan: 1.  Ongoing drain care by IR 2.  Continue to monitor LFTs while in the hospital but they are reassuringly decreasing today 3.  Hopefully patient has bowel movement with Lactulose 4.  Please await any final recommendations from Dr. Hilarie Fredrickson later today, we will likely sign off.  Thank you for your kind consultation.   LOS: 0 days   Levin Erp  09/11/2020, 11:07 AM  Addendum: I have taken an interval history, reviewed the chart, and examined the patient. I agree with the Advanced Practitioner's note and impression. Successfully perc biliary tube yesterday with downtrending LFTs Further care per oncology We will sign off but remain available, please call if needed

## 2020-09-12 LAB — CBC WITH DIFFERENTIAL/PLATELET
Abs Immature Granulocytes: 0.03 10*3/uL (ref 0.00–0.07)
Basophils Absolute: 0 10*3/uL (ref 0.0–0.1)
Basophils Relative: 0 %
Eosinophils Absolute: 0.1 10*3/uL (ref 0.0–0.5)
Eosinophils Relative: 1 %
HCT: 34 % — ABNORMAL LOW (ref 39.0–52.0)
Hemoglobin: 11.3 g/dL — ABNORMAL LOW (ref 13.0–17.0)
Immature Granulocytes: 0 %
Lymphocytes Relative: 17 %
Lymphs Abs: 1.3 10*3/uL (ref 0.7–4.0)
MCH: 28.7 pg (ref 26.0–34.0)
MCHC: 33.2 g/dL (ref 30.0–36.0)
MCV: 86.3 fL (ref 80.0–100.0)
Monocytes Absolute: 0.9 10*3/uL (ref 0.1–1.0)
Monocytes Relative: 12 %
Neutro Abs: 5.1 10*3/uL (ref 1.7–7.7)
Neutrophils Relative %: 70 %
Platelets: 184 10*3/uL (ref 150–400)
RBC: 3.94 MIL/uL — ABNORMAL LOW (ref 4.22–5.81)
RDW: 14.8 % (ref 11.5–15.5)
WBC: 7.4 10*3/uL (ref 4.0–10.5)
nRBC: 0 % (ref 0.0–0.2)

## 2020-09-12 LAB — COMPREHENSIVE METABOLIC PANEL
ALT: 181 U/L — ABNORMAL HIGH (ref 0–44)
AST: 117 U/L — ABNORMAL HIGH (ref 15–41)
Albumin: 3.2 g/dL — ABNORMAL LOW (ref 3.5–5.0)
Alkaline Phosphatase: 336 U/L — ABNORMAL HIGH (ref 38–126)
Anion gap: 10 (ref 5–15)
BUN: 11 mg/dL (ref 8–23)
CO2: 27 mmol/L (ref 22–32)
Calcium: 9 mg/dL (ref 8.9–10.3)
Chloride: 99 mmol/L (ref 98–111)
Creatinine, Ser: 0.73 mg/dL (ref 0.61–1.24)
GFR, Estimated: >60 mL/min (ref >60)
Glucose, Bld: 123 mg/dL — ABNORMAL HIGH (ref 70–99)
Potassium: 3.2 mmol/L — ABNORMAL LOW (ref 3.5–5.1)
Sodium: 136 mmol/L (ref 135–145)
Total Bilirubin: 5 mg/dL — ABNORMAL HIGH (ref 0.3–1.2)
Total Protein: 6.6 g/dL (ref 6.5–8.1)

## 2020-09-12 MED ORDER — SENNOSIDES-DOCUSATE SODIUM 8.6-50 MG PO TABS: 1.0000 | ORAL_TABLET | Freq: Every day | ORAL | 0 refills | Status: DC

## 2020-09-12 MED ORDER — BISACODYL 10 MG RE SUPP
10.0000 mg | Freq: Once | RECTAL | Status: AC
  Administered 2020-09-12: 10 mg via RECTAL
  Filled 2020-09-12: qty 1

## 2020-09-12 MED ORDER — POTASSIUM CHLORIDE CRYS ER 20 MEQ PO TBCR
40.0000 meq | EXTENDED_RELEASE_TABLET | Freq: Once | ORAL | Status: AC
  Administered 2020-09-12: 40 meq via ORAL
  Filled 2020-09-12: qty 2

## 2020-09-12 NOTE — Discharge Summary (Addendum)
Physician Discharge Summary  ABHIRAJ DOZAL HDQ:[id number] DOB: [birth year] DOA: 09/09/2020  PCP: Biagio Borg, MD  Admit date: 09/09/2020 Discharge date: 09/12/2020  Time spent: 35 minutes  Recommendations for Outpatient Follow-up:  Interventional radiology in 1 week for cholangiogram, CMP Oncology Dr. Marin Olp in 1 to 2 weeks Gastroenterology in 1 month Routine drain care  Discharge Diagnoses:  Active Problems:   Biliary obstruction Metastatic gastric cancer Chronic iron deficiency anemia GERD Type 2 diabetes mellitus Dyslipidemia Glaucoma  Discharge Condition: Stable  Diet recommendation: Diabetic  Filed Weights   09/09/20 1009  Weight: 84.3 kg    History of present illness:  80 year old male with history of metastatic gastric cancer, on chemotherapy followed by Dr. Marin Olp, was recently noted to have abnormal LFTs, and further work-up with MRCP was noted to have diffuse biliary ductal dilation and soft tissue mass, he was subsequently set up to see gastroenterology and underwent an ERCP on 6/8 to attempt biliary stent placement, this was attempted and unsuccessful, subsequently IR was consulted  Hospital Course:   Biliary obstruction -Elevated bilirubin, abnormal LFTs, MRCP noted diffuse biliary ductal dilation due to soft tissue mass in the porta hepatis adjacent to pancreatic head likely represents metastatic lymphadenopathy in addition has suspected pancreatic mass and peritoneal metastasis as well -Gastroenterology following, and underwent an unsuccessful ERCP attempt 6/8 followed by failed [facility] biliary drain placement -On 6/9  had successful PERC transhepatic biliary drain placed in IR -Bilirubin improving down to 5 from 11 -Now improved, stable and cleared for discharge by all teams, IR recommended follow-up on June 16 or 17 for CMP and cholangiogram, to evaluate for possible capping trial.  Long-term plan is still to eventually get a stent placed if  possible. -Follow-up with Dr. Marin Olp in 10 days  Constipation -Resolved with laxatives and suppository -Added Senokot to home meds at discharge   Metastatic gastric cancer -Followed by Dr. Marin Olp, recent MRCP suggests disease progression, liver mets and new peritoneal metastasis -Chemotherapy on hold -I would imagine his prognosis is guarded, needs goals of care/prognosis discussions with oncology   Chronic iron deficiency anemia     -Hemoglobin is stable   GERD     - protonix   HLD     - continue statin   [place]     - continue home regimen   Hx of DM2     - diet controlled     - last A1c was 6.6 on 04/22/20     Consultants:  Gastroenterology, IR   Procedures: Percutaneous transhepatic 6 French biliary drainage catheter placed in IR Dr. Pascal Lux 6/9    Discharge Exam: Vitals:   09/11/20 2118 09/12/20 0534  BP: (!) 150/87 125/81  Pulse: 71 67  Resp: 17 15  Temp: 99.8 F (37.7 C) 98.9 F (37.2 C)  SpO2: 100% 99%    General: AAOx3, no distress HEENT: Positive icterus Cardiovascular: S1-S2, regular rate rhythm Respiratory: Clear  Discharge Instructions   Discharge Instructions     Diet - low sodium heart healthy   Complete by: As directed    Discharge wound care:   Complete by: As directed    Routine drain care   Increase activity slowly   Complete by: As directed       Allergies as of 09/12/2020   No Known Allergies      Medication List     STOP taking these medications    fluconazole 100 MG tablet Commonly known as: Diflucan  TAKE these medications    dorzolamide-timolol 22.3-6.8 MG/ML ophthalmic solution Commonly known as: COSOPT Place 1 drop into both eyes 2 (two) times daily.   famotidine 20 MG tablet Commonly known as: PEPCID TAKE 1 TABLET BY MOUTH EVERY DAY What changed: when to take this   latanoprost 0.005 % ophthalmic solution Commonly known as: XALATAN Place 1 drop into both eyes at bedtime.    lidocaine-prilocaine cream Commonly known as: EMLA Apply 1 application topically as needed. Place on the port one hour before appointment.   multivitamin with minerals Tabs tablet Take 1 tablet by mouth in the morning. Men's One-A-Day Chewable   ondansetron 8 MG tablet Commonly known as: Zofran Take 1 tablet (8 mg total) by mouth 2 (two) times daily as needed (Nausea or vomiting).   senna-docusate 8.6-50 MG tablet Commonly known as: Senokot S Take 1 tablet by mouth daily.   Systane Ultra 0.4-0.3 % Soln Generic drug: Polyethyl Glycol-Propyl Glycol Apply 1 drop to eye 3 (three) times daily as needed (dry eyes).   traMADol 50 MG tablet Commonly known as: ULTRAM Take 2 tablets (100 mg total) by mouth at bedtime as needed for moderate pain or severe pain.       ASK your doctor about these medications    atorvastatin 20 MG tablet Commonly known as: Lipitor Take 1 tablet (20 mg total) by mouth daily.   meclizine 12.5 MG tablet Commonly known as: ANTIVERT TAKE 1 TABLET BY MOUTH 3 TIMES DAILY AS NEEDED FOR DIZZINESS.               Discharge Care Instructions  (From admission, onward)           Start     Ordered   09/12/20 0000  Discharge wound care:       Comments: Routine drain care   09/12/20 1252           No Known Allergies  Follow-up Information     [facility] Follow up.   Why: Needs CMP and cholangiogram 6/16 or 6/17 at either [facility] or [facility]. A scheduler from our office will call you with the date/time. Please call our office with any questions prior to your visit. Contact information: [place] [address] [phone]         Volanda Napoleon, MD. Go in 10 day(s).   Specialty: Oncology Contact information: [place] [address] [phone]                  The results of significant diagnostics from this hospitalization (including imaging, microbiology,  ancillary and laboratory) are listed below for reference.    Significant Diagnostic Studies: CT ABDOMEN PELVIS W CONTRAST  Result Date: 09/11/2020 CLINICAL DATA:  History of gastric cancer, now with elevated bilirubin post failed attempt at endoscopic biliary stent placement as well as percutaneous biliary drainage catheter placement. Please perform abdominal CT for potential repeat attempt at internal/external biliary drainage catheter placement. EXAM: CT ABDOMEN AND PELVIS WITH CONTRAST TECHNIQUE: Multidetector CT imaging of the abdomen and pelvis was performed using the standard protocol following bolus administration of intravenous contrast. CONTRAST:  121mL OMNIPAQUE IOHEXOL 300 MG/ML  SOLN COMPARISON:  MRCP-09/04/2020; ERCP-09/09/2020; PET-CT-06/25/2020 FINDINGS: Lower chest: Limited visualization of the lower thorax demonstrates minimal dependent subpleural ground-glass atelectasis, right greater than left. There is a punctate granuloma within the right middle lobe. No discrete focal airspace opacities. Borderline cardiomegaly. No pericardial effusion. Port  a catheter tip terminates within the superior cavoatrial junction. Hepatobiliary: Redemonstrated marked distension of the gallbladder with very minimal intrahepatic biliary ductal dilatation, similar to abdominal MRCP performed 09/04/2020. No definitive gallbladder wall thickening or pericholecystic stranding. Scattered foci air at the level of the porta hepatis and minimal amount of ill-defined stranding adjacent to the anterior aspect the left lobe of the liver and right lateral body wall, the result recent attempted percutaneous biliary drainage catheter placement. Approximately 1.2 cm nodule adjacent to the anterior aspect the lobe liver image 11, series 7 similar to recent abdominal MRI and again favored to represent a hepatic capsular implant. There is a punctate (approximately 0.6 cm) hypoattenuating lesion with the dome of the right lobe of  the liver (image 11, series 2, which is too small to accurately characterize favored to represent a hepatic cyst. Additional subcentimeter hypoattenuating hepatic lesions (images 21 and 31, series 2) too small to accurately characterize remain indeterminate. Pancreas: Previously question hypovascular involving the pancreatic body on recent abdominal MRI is not well demonstrated the present examination. No definitive downstream pancreatic ductal dilatation or peripancreatic stranding. Spleen: Normal appearance of the spleen. Adrenals/Urinary Tract: There is symmetric enhancement and excretion of the bilateral kidneys. There is a punctate (approximately 5 mm) nonobstructing stone within the inferior pole the right kidney image 34, series 2). No discrete left-sided renal stones on this postcontrast examination. No renal stones are seen along expected course of either ureter or the urinary bladder. Mild thickening of the urinary bladder wall, potentially accentuated due to underdistention. Redemonstrated small left-sided extrarenal pelvis. No evidence of urinary obstruction. Normal appearance the bilateral adrenal glands. Stomach/Bowel: Stable postoperative change the stomach compatible with history partial gastric resection. Moderate colonic stool burden without evidence of enteric obstruction. Scattered colonic diverticulosis without evidence of superimposed acute diverticulitis. Normal appearance of the terminal ileum and the retrocecal appendix. No discrete areas of bowel wall thickening. No pneumoperitoneum, pneumatosis or portal venous gas. Vascular/Lymphatic: Normal caliber of the aorta. There is a very minimal amount of involving the bilateral common iliac and femoral arteries, not resulting in hemodynamically significant stenosis. The major branch vessels the aorta appear patent on this non CTA examination. No bulky retroperitoneal, pelvic or inguinal lymphadenopathy. Reproductive: Dystrophic calcifications  within a borderline enlarged prostate gland. No free fluid the pelvic cul-de-sac. Other: Redemonstrated scattered peritoneal nodules with dominant nodule within the right upper abdominal quadrant measuring 1.4 x 1.1 cm (image 38, series 2). Additional peritoneal nodules are seen on images 27, 38, 43, 46 and 47, series 2. Musculoskeletal: No acute or aggressive osseous abnormalities. Redemonstrated bilateral L5 pars defects associated anterolisthesis. Mild-to-moderate multilevel lumbar spine DDD, worse at L4-L5 with disc space height loss, endplate irregularity and posterior directed osteophytosis. Mild degenerative change the hips with joint space loss, subchondral sclerosis and osteophytosis. No evidence. IMPRESSION: 1. Marked distension of the gallbladder with minimal intrahepatic biliary ductal dilatation, similar to MRCP performed 09/04/2020. 2. Redemonstrated suspected peritoneal metastatic disease including a subcapsular implant adjacent to the left lobe of the liver. 3. Previously questioned pancreatic head mass is not well demonstrated on the present examination though resolution should not assumed on the basis of this examination. 4. Similar appearing indeterminate hepatic lesions at least one of which was found to be hypermetabolic on PET-CT performed 06/25/2020. 5. Solitary punctate (5 mm) nonobstructing right-sided renal stone. Electronically Signed   By: Sandi Mariscal M.D.   On: 09/11/2020 08:29   MR 3D Recon At Scanner  Result Date: 09/04/2020 CLINICAL  DATA:  New onset jaundice.  Metastatic gastric carcinoma. EXAM: MRI ABDOMEN WITHOUT AND WITH CONTRAST (INCLUDING MRCP) TECHNIQUE: Multiplanar multisequence MR imaging of the abdomen was performed both before and after the administration of intravenous contrast. Heavily T2-weighted images of the biliary and pancreatic ducts were obtained, and three-dimensional MRCP images were rendered by post processing. CONTRAST:  70mL GADAVIST GADOBUTROL 1 MMOL/ML IV  SOLN COMPARISON:  CT on 06/12/2020 FINDINGS: Lower chest: No acute findings. Hepatobiliary: No intrahepatic masses are seen, however there are a few sub-cm enhancing soft tissue nodules along the capsular surface of the left hepatic lobe consistent with peritoneal metastases. The gallbladder is distended and diffuse intra and extra hepatic biliary ductal dilatation is now seen, with common bile duct measuring 9 mm. Obstruction of the common bile duct is seen in the porta hepatis, due to an irregular soft tissue mass measuring approximately 4 x 3 cm on image 45/41. This most likely represents metastatic lymphadenopathy. Pancreas: A subtle hypovascular mass is seen in the pancreatic body which measures 2.3 x 1.9 cm on image 47/27. This may represent a pancreatic metastasis or adjacent peripancreatic lymphadenopathy. Spleen:  Within normal limits in size and appearance. Adrenals/Urinary Tract: No masses identified. No evidence of hydronephrosis. Stomach/Bowel: Prior gastric jejunostomy again noted. Otherwise unremarkable. Vascular/Lymphatic: See above findings in hepatobiliary and pancreas sections. Other: Multiple small enhancing peritoneal and omental soft tissue nodules are again seen in the anterior right and left upper quadrants, consistent with peritoneal metastases. Musculoskeletal:  No suspicious bone lesions identified. IMPRESSION: New diffuse biliary ductal dilatation due to soft tissue mass in the porta hepatis adjacent to pancreatic head. This most likely represents metastatic lymphadenopathy. Small mass in the pancreatic body, most likely representing pancreatic metastasis or adjacent peripancreatic lymphadenopathy. Multiple sub-cm enhancing soft tissue nodules along the capsular surface of the left hepatic lobe and bilateral upper quadrant omental fat, consistent with peritoneal metastases. Electronically Signed   By: Marlaine Hind M.D.   On: 09/04/2020 21:06   DG ERCP  Result Date: 09/09/2020 CLINICAL  DATA:  Attempted ERCP EXAM: ERCP TECHNIQUE: Multiple spot images obtained with the fluoroscopic device and submitted for interpretation post-procedure. FLUOROSCOPY TIME:  Fluoroscopy Time:  3 minutes 38 seconds Number of Acquired Spot Images: 0 COMPARISON:  MRCP 09/04/2020 FINDINGS: A total of 20 intraoperative saved images are submitted for review. The images demonstrate a flexible duodenal scope in various positions within the duodenum and proximal jejunum. No evidence of immediate complication. IMPRESSION: Attempted ERCP. These images were submitted for radiologic interpretation only. Please see the procedural report for the amount of contrast and the fluoroscopy time utilized. Electronically Signed   By: Jacqulynn Cadet M.D.   On: 09/09/2020 14:47   MR ABDOMEN MRCP W WO CONTAST  Result Date: 09/04/2020 CLINICAL DATA:  New onset jaundice.  Metastatic gastric carcinoma. EXAM: MRI ABDOMEN WITHOUT AND WITH CONTRAST (INCLUDING MRCP) TECHNIQUE: Multiplanar multisequence MR imaging of the abdomen was performed both before and after the administration of intravenous contrast. Heavily T2-weighted images of the biliary and pancreatic ducts were obtained, and three-dimensional MRCP images were rendered by post processing. CONTRAST:  41mL GADAVIST GADOBUTROL 1 MMOL/ML IV SOLN COMPARISON:  CT on 06/12/2020 FINDINGS: Lower chest: No acute findings. Hepatobiliary: No intrahepatic masses are seen, however there are a few sub-cm enhancing soft tissue nodules along the capsular surface of the left hepatic lobe consistent with peritoneal metastases. The gallbladder is distended and diffuse intra and extra hepatic biliary ductal dilatation is now seen, with common  bile duct measuring 9 mm. Obstruction of the common bile duct is seen in the porta hepatis, due to an irregular soft tissue mass measuring approximately 4 x 3 cm on image 45/41. This most likely represents metastatic lymphadenopathy. Pancreas: A subtle hypovascular  mass is seen in the pancreatic body which measures 2.3 x 1.9 cm on image 47/27. This may represent a pancreatic metastasis or adjacent peripancreatic lymphadenopathy. Spleen:  Within normal limits in size and appearance. Adrenals/Urinary Tract: No masses identified. No evidence of hydronephrosis. Stomach/Bowel: Prior gastric jejunostomy again noted. Otherwise unremarkable. Vascular/Lymphatic: See above findings in hepatobiliary and pancreas sections. Other: Multiple small enhancing peritoneal and omental soft tissue nodules are again seen in the anterior right and left upper quadrants, consistent with peritoneal metastases. Musculoskeletal:  No suspicious bone lesions identified. IMPRESSION: New diffuse biliary ductal dilatation due to soft tissue mass in the porta hepatis adjacent to pancreatic head. This most likely represents metastatic lymphadenopathy. Small mass in the pancreatic body, most likely representing pancreatic metastasis or adjacent peripancreatic lymphadenopathy. Multiple sub-cm enhancing soft tissue nodules along the capsular surface of the left hepatic lobe and bilateral upper quadrant omental fat, consistent with peritoneal metastases. Electronically Signed   By: Marlaine Hind M.D.   On: 09/04/2020 21:06   IR BILIARY DRAIN PLACEMENT WITH CHOLANGIOGRAM  Result Date: 09/10/2020 INDICATION: History of gastric cancer, now with obstructive jaundice, post failed attempt at ERCP biliary stent placement. EXAM: ATTEMPTED THOUGH ULTIMATELY UNSUCCESSFUL ULTRASOUND AND FLUOROSCOPIC GUIDED PERCUTANEOUS TRANSHEPATIC CHOLANGIOGRAM AND BILIARY TUBE PLACEMENT COMPARISON:  MRCP-09/04/2020; PET-CT-06/26/2018 MEDICATIONS: Cefoxitin 2 gm IV; The antibiotic was administered with an appropriate time frame prior to the initiation of the procedure CONTRAST:  45 mL OMNIPAQUE IOHEXOL 300 MG/ML  SOLN ANESTHESIA/SEDATION: Moderate (conscious) sedation was employed during this procedure. A total of Versed 3 mg and Fentanyl  100 mcg was administered intravenously. Moderate Sedation Time: 88 minutes. The patient's level of consciousness and vital signs were monitored continuously by radiology nursing throughout the procedure under my direct supervision. FLUOROSCOPY TIME:  10 minutes, 54 seconds (659 mGy) COMPLICATIONS: None immediate. TECHNIQUE: Informed written consent was obtained from the patient after a discussion of the risks, benefits and alternatives to treatment. Questions regarding the procedure were encouraged and answered. A timeout was performed prior to the initiation of the procedure. The right upper abdominal quadrant was prepped and draped in the usual sterile fashion, and a sterile drape was applied covering the operative field. Maximum barrier sterile technique with sterile gowns and gloves were used for the procedure. A timeout was performed prior to the initiation of the procedure. Sonographic evaluation was performed the liver demonstrating only a small amount of peripheral and central intrahepatic biliary duct dilatation with marked distension of gallbladder. Prolonged efforts were made to cannulate a peripherally located bile duct within both the right and left lobes of the liver however despite prolonged effort, a bile duct was unable to be successfully cannulated catheterized either with ultrasound or fluoroscopic guidance secondary to lack of biliary ductal dilatation as well as poor percutaneous window due to interposition the ribs and the costal cartilage. As such the procedure was terminated. Dressings were applied. The patient tolerated the procedure well without immediate postprocedural complication. FINDINGS: Sonographic evaluation demonstrates lack of significant dilatation of either the peripheral or central aspect of the intrahepatic biliary tree and despite prolonged efforts, a bile duct was unable to be successfully opacified and/or cannulated. IMPRESSION: Attempted though ultimately unsuccessful  percutaneous cholangiogram and biliary drain placement due to  a combination lack significant dilatation the central of peripheral intrahepatic biliary system as well as poor percutaneous window due to interposition of ribs and the costal cartilage. PLAN: Will recheck patient's bilirubin level tomorrow and will discuss with the providing service the appropriateness of proceeding with repeat attempt versus cholecystostomy tube placement as indicated. Electronically Signed   By: Sandi Mariscal M.D.   On: 09/10/2020 09:57   IR INT EXT BILIARY DRAIN WITH CHOLANGIOGRAM  Result Date: 09/10/2020 INDICATION: History of gastric cancer, now with obstructive jaundice, post failed attempt at ERCP biliary stent placement (due to surgical anatomy) as well as failed attempt percutaneous biliary drainage placement yesterday. As such, patient presents today for repeat attempt at internal/external biliary drainage catheter placement EXAM: ULTRASOUND AND FLUOROSCOPIC GUIDED PERCUTANEOUS TRANSHEPATIC CHOLANGIOGRAM AND BILIARY TUBE PLACEMENT COMPARISON:  Attempted internal/external biliary drainage catheter placement-09/09/2020 CT abdomen and pelvis-earlier same day MRCP-09/04/2020 PET-CT-06/25/2020 MEDICATIONS: Cefoxitin 2 gm IV; The antibiotic was administered with an appropriate time frame prior to the initiation of the procedure CONTRAST:  83mL OMNIPAQUE IOHEXOL 300 MG/ML SOLN - administered into the biliary tree. ANESTHESIA/SEDATION: Moderate (conscious) sedation was employed during this procedure. A total of Versed 2 mg and Fentanyl 100 mcg was administered intravenously. Moderate Sedation Time: 28 minutes. The patient's level of consciousness and vital signs were monitored continuously by radiology nursing throughout the procedure under my direct supervision. FLUOROSCOPY TIME:  4 minutes, 42 seconds (921 mGy close COMPLICATIONS: None immediate. TECHNIQUE: Informed written consent was obtained from the patient after a discussion of  the risks, benefits and alternatives to treatment. Questions regarding the procedure were encouraged and answered. A timeout was performed prior to the initiation of the procedure. The right upper abdominal quadrant was prepped and draped in the usual sterile fashion, and a sterile drape was applied covering the operative field. Maximum barrier sterile technique with sterile gowns and gloves were used for the procedure. A timeout was performed prior to the initiation of the procedure. Exhaustive sonographic evaluation performed of the liver by the dictating interventional radiologist ultimately delineating a mildly dilated duct within the peripheral aspect the left lobe of the liver. After the overlying soft tissues were anesthetized with 1% Lidocaine with epinephrine, under direct ultrasound guidance, a 22 gauge Chiba needle was utilized to cannulate the peripheral aspect of the targeted left intrahepatic biliary duct. Appropriate position was confirmed with limited contrast injection. Next, the duct was cannulated with a Nitrex wire and dilated with an Accustick set under fluoroscopic guidance. Limited cholangiograms were performed in various obliquities confirming appropriate access. Nitrex wire was advanced to the level of the duodenum and exchanged over a 4 French angled glide catheter for a Amplatz wire. Under intermittent fluoroscopic guidance the track was dilated ultimately allowing placement of a 10 [place] biliary drainage catheter with coil ultimately locked within the duodenum. Contrast was injected and a completion radiographs were obtained in various obliquities. The catheter was connected to a drainage bag which yielded the brisk return of clear bile. The catheter was secured to the skin with an interrupted suture and StatLock device. Dressings were applied. The patient tolerated the procedure well without immediate postprocedural complication. FINDINGS: Exhaustive sonographic evaluation performed by  the dictating interventional radiologist ultimately demonstrated mildly dilated duct within the peripheral aspect of the left lobe of the liver which was targeted under direct ultrasound guidance, ultimately allowing fluoroscopic guided placement of a 10 French percutaneous drainage catheter with end ultimately coiled and locked within the duodenum and radiopaque side marker  located proximal to the level of the biliary hilum. Contrast injections demonstrate malignant narrowing/subtotal occlusion involving the mid and distal aspects of the CBD just caudal to the take-off of the cystic duct as was demonstrated on preceding MRCP. Biliary drainage catheter demonstrates opacification both the right and left intrahepatic biliary trees. IMPRESSION: Successful placement of a 10.2 French percutaneous biliary drainage catheter via left-sided approach with end coiled and locked within the duodenum. PLAN: - Recommend obtaining daily CMPs while the patient is admitted to the hospital. - Maintain the percutaneous biliary drainage catheter to external drainage until the patient's bilirubin has reached a nadir. - Once the patient's bilirubin has reached a nadir, the patient may return for definitive percutaneous cholangiogram and potential initiation of a capping trial as indicated. Note, ultimately, the patient may be a candidate for biliary stent placement as indicated. Electronically Signed   By: Sandi Mariscal M.D.   On: 09/10/2020 17:12    Microbiology: Recent Results (from the past 240 hour(s))  SARS CORONAVIRUS 2 (TAT 6-24 HRS) Nasopharyngeal Nasopharyngeal Swab     Status: None   Collection Time: 09/10/20  2:30 AM   Specimen: Nasopharyngeal Swab  Result Value Ref Range Status   SARS Coronavirus 2 NEGATIVE NEGATIVE Final    Comment: (NOTE) SARS-CoV-2 target nucleic acids are NOT DETECTED.  The SARS-CoV-2 RNA is generally detectable in upper and lower respiratory specimens during the acute phase of infection.  Negative results do not preclude SARS-CoV-2 infection, do not rule out co-infections with other pathogens, and should not be used as the sole basis for treatment or other patient management decisions. Negative results must be combined with clinical observations, patient history, and epidemiological information. The expected result is Negative.  Fact Sheet for Patients: SugarRoll.be  Fact Sheet for Healthcare Providers: https://www.woods-mathews.com/  This test is not yet approved or cleared by the [place] FDA and  has been authorized for detection and/or diagnosis of SARS-CoV-2 by FDA under an Emergency Use Authorization (EUA). This EUA will remain  in effect (meaning this test can be used) for the duration of the COVID-19 declaration under Se ction 564(b)(1) of the Act, 21 U.S.C. section 360bbb-3(b)(1), unless the authorization is terminated or revoked sooner.  Performed at [facility], [place] [address] [place], [place] [address]      Labs: Basic Metabolic Panel: Recent Labs  Lab 09/09/20 1900 09/10/20 0457 09/11/20 0736 09/12/20 0513  NA 141 140  141 138 136  K 3.4* 3.5  3.5 3.7 3.2*  CL 107 107  108 102 99  CO2 26 29  29 28 27   GLUCOSE 114* 124*  126* 129* 123*  BUN 11 10  10 9 11   CREATININE 0.80 0.68  0.52* 0.61 0.73  CALCIUM 9.2 8.8*  8.8* 8.9 9.0   Liver Function Tests: Recent Labs  Lab 09/09/20 1900 09/10/20 0457 09/11/20 0736 09/12/20 0513  AST 271* 214*  213* 150* 117*  ALT 300* 253*  249* 208* 181*  ALKPHOS 467* 383*  393* 373* 336*  BILITOT 10.6* 9.7*  10.1* 5.5* 5.0*  PROT 6.9 6.1*  6.1* 6.3* 6.6  ALBUMIN 3.4* 2.9*  3.0* 3.0* 3.2*   No results for input(s): LIPASE, AMYLASE in the last 168 hours. No results for input(s): AMMONIA in the last 168 hours. CBC: Recent Labs  Lab 09/10/20 0457 09/11/20 0736 09/12/20 0513  WBC 4.7 6.2 7.4  NEUTROABS  --  4.4 5.1  HGB 10.6* 11.3*  11.3*  HCT 32.2* 33.3*  34.0*  MCV 88.2 86.5 86.3  PLT 180 182 184   Cardiac Enzymes: No results for input(s): CKTOTAL, CKMB, CKMBINDEX, TROPONINI in the last 168 hours. BNP: BNP (last 3 results) No results for input(s): BNP in the last 8760 hours.  ProBNP (last 3 results) No results for input(s): PROBNP in the last 8760 hours.  CBG: No results for input(s): GLUCAP in the last 168 hours.     Signed:  Domenic Polite MD.  [facility] 09/12/2020, 12:54 PM

## 2020-09-12 NOTE — Progress Notes (Signed)
D/C instructions given to patient. Patient had no questions. Patient family and patient educated on how to empty, flush and do dressing changes on the drain.

## 2020-09-14 ENCOUNTER — Inpatient Hospital Stay: Payer: Medicare PPO

## 2020-09-14 ENCOUNTER — Telehealth: Payer: Self-pay

## 2020-09-14 ENCOUNTER — Other Ambulatory Visit: Payer: Self-pay | Admitting: *Deleted

## 2020-09-14 ENCOUNTER — Other Ambulatory Visit: Payer: Self-pay

## 2020-09-14 ENCOUNTER — Inpatient Hospital Stay (HOSPITAL_BASED_OUTPATIENT_CLINIC_OR_DEPARTMENT_OTHER): Payer: Medicare PPO | Admitting: Hematology & Oncology

## 2020-09-14 ENCOUNTER — Encounter: Payer: Self-pay | Admitting: Hematology & Oncology

## 2020-09-14 ENCOUNTER — Telehealth: Payer: Self-pay | Admitting: *Deleted

## 2020-09-14 VITALS — BP 125/74 | HR 82 | Temp 98.4°F | Resp 16 | Wt 175.0 lb

## 2020-09-14 DIAGNOSIS — C163 Malignant neoplasm of pyloric antrum: Secondary | ICD-10-CM | POA: Diagnosis not present

## 2020-09-14 DIAGNOSIS — Z9221 Personal history of antineoplastic chemotherapy: Secondary | ICD-10-CM | POA: Diagnosis not present

## 2020-09-14 DIAGNOSIS — C169 Malignant neoplasm of stomach, unspecified: Secondary | ICD-10-CM | POA: Diagnosis not present

## 2020-09-14 DIAGNOSIS — C162 Malignant neoplasm of body of stomach: Secondary | ICD-10-CM

## 2020-09-14 DIAGNOSIS — D508 Other iron deficiency anemias: Secondary | ICD-10-CM | POA: Diagnosis not present

## 2020-09-14 DIAGNOSIS — C161 Malignant neoplasm of fundus of stomach: Secondary | ICD-10-CM

## 2020-09-14 DIAGNOSIS — Z95828 Presence of other vascular implants and grafts: Secondary | ICD-10-CM

## 2020-09-14 DIAGNOSIS — Z79899 Other long term (current) drug therapy: Secondary | ICD-10-CM | POA: Diagnosis not present

## 2020-09-14 LAB — CMP (CANCER CENTER ONLY)
ALT: 119 U/L — ABNORMAL HIGH (ref 0–44)
AST: 67 U/L — ABNORMAL HIGH (ref 15–41)
Albumin: 3.7 g/dL (ref 3.5–5.0)
Alkaline Phosphatase: 301 U/L — ABNORMAL HIGH (ref 38–126)
Anion gap: 8 (ref 5–15)
BUN: 16 mg/dL (ref 8–23)
CO2: 26 mmol/L (ref 22–32)
Calcium: 9.2 mg/dL (ref 8.9–10.3)
Chloride: 97 mmol/L — ABNORMAL LOW (ref 98–111)
Creatinine: 0.78 mg/dL (ref 0.61–1.24)
GFR, Estimated: >60 mL/min (ref >60)
Glucose, Bld: 188 mg/dL — ABNORMAL HIGH (ref 70–99)
Potassium: 3.8 mmol/L (ref 3.5–5.1)
Sodium: 131 mmol/L — ABNORMAL LOW (ref 135–145)
Total Bilirubin: 4.7 mg/dL (ref 0.3–1.2)
Total Protein: 6.5 g/dL (ref 6.5–8.1)

## 2020-09-14 LAB — CBC WITH DIFFERENTIAL (CANCER CENTER ONLY)
Abs Immature Granulocytes: 0.02 10*3/uL (ref 0.00–0.07)
Basophils Absolute: 0 10*3/uL (ref 0.0–0.1)
Basophils Relative: 0 %
Eosinophils Absolute: 0.1 10*3/uL (ref 0.0–0.5)
Eosinophils Relative: 2 %
HCT: 32.9 % — ABNORMAL LOW (ref 39.0–52.0)
Hemoglobin: 10.9 g/dL — ABNORMAL LOW (ref 13.0–17.0)
Immature Granulocytes: 0 %
Lymphocytes Relative: 19 %
Lymphs Abs: 1.1 10*3/uL (ref 0.7–4.0)
MCH: 28.6 pg (ref 26.0–34.0)
MCHC: 33.1 g/dL (ref 30.0–36.0)
MCV: 86.4 fL (ref 80.0–100.0)
Monocytes Absolute: 0.8 10*3/uL (ref 0.1–1.0)
Monocytes Relative: 15 %
Neutro Abs: 3.5 10*3/uL (ref 1.7–7.7)
Neutrophils Relative %: 64 %
Platelet Count: 197 10*3/uL (ref 150–400)
RBC: 3.81 MIL/uL — ABNORMAL LOW (ref 4.22–5.81)
RDW: 14.2 % (ref 11.5–15.5)
WBC Count: 5.6 10*3/uL (ref 4.0–10.5)
nRBC: 0 % (ref 0.0–0.2)

## 2020-09-14 LAB — TOTAL PROTEIN, URINE DIPSTICK: Protein, ur: NEGATIVE mg/dL

## 2020-09-14 LAB — LACTATE DEHYDROGENASE: LDH: 172 U/L (ref 98–192)

## 2020-09-14 MED ORDER — SODIUM CHLORIDE 0.9% FLUSH
10.0000 mL | Freq: Once | INTRAVENOUS | Status: AC
  Administered 2020-09-14: 10 mL via INTRAVENOUS
  Filled 2020-09-14: qty 10

## 2020-09-14 MED ORDER — HEPARIN SOD (PORK) LOCK FLUSH 100 UNIT/ML IV SOLN
500.0000 [IU] | Freq: Once | INTRAVENOUS | Status: AC
  Administered 2020-09-14: 500 [IU] via INTRAVENOUS
  Filled 2020-09-14: qty 5

## 2020-09-14 MED ORDER — GEMCITABINE HCL CHEMO INJECTION 1 GM/26.3ML
600.0000 mg/m2 | INTRAVENOUS | Status: DC
Start: 2020-09-18 — End: 2020-09-14

## 2020-09-14 NOTE — Telephone Encounter (Signed)
Dr. Marin Olp notified of total bili-4.7.  No new orders received at this time.

## 2020-09-14 NOTE — Patient Instructions (Signed)

## 2020-09-14 NOTE — Telephone Encounter (Signed)
Pt called with appts per 09/14/20 los, an inbasket was sent to dr Marin Olp to clarify appts per 09/14/20 los

## 2020-09-14 NOTE — Progress Notes (Signed)
Hematology and Oncology Follow Up Visit  Jonathan Lewis [id number] 28-Feb-1941 80 y.o. 09/14/2020   Principle Diagnosis:  Stage IIIA (T3N2M0) adenocarcinoma of the stomach --  S/p distal gastrectomy on 03/19/2019  -- HER2-/PD-L1+ -- metastatic   Current Therapy:    Taxol/Cyramza -- start cycle #1 on 07/20/2020 - Cyramza d/c due to GI bleed.  Gemzar/XRT -- start on 09/18/2020      Adjuvant FOLFOX - started on 05/13/2019, s/p cycle #8 -completed on Dec 11, 2019   Interim History:  Jonathan Lewis is here today for follow-up after being in hospital.  He was hospitalized because of biliary obstruction.  He cannot have a stent placed by gastroenterology.  As such, he had a biliary drain placed by interventional radiology.  They, as always, did a fantastic job.  He did have a MRI of the abdomen.  It showed that he had a 4 x 3 cm porta hepatus lymph node mass that was present on the biliary tree.  He did have some disease elsewhere.  Has some omental nodules.  He had a tumor in his pancreas.  I think that it would be wise to try to do radiation therapy to this area.  I think this would help shrink the lymph node mass.  I think that I should try to give him some weekly gemcitabine to try to help with the radiation.  He is eating well.  He is having no problems with nausea or vomiting.  He was little constipated but this seems to be a little bit better.  He is not having any fever.  There is no pruritus.  He has had no cough or shortness of breath.  He has had no leg swelling.  There have been no rashes.  Today, his bilirubin was 4.7.  It is gradually decreasing.  Currently, his performance status is ECOG 1.    Medications:  Allergies as of 09/14/2020   No Known Allergies      Medication List        Accurate as of September 14, 2020 12:51 PM. If you have any questions, ask your nurse or doctor.          atorvastatin 20 MG tablet Commonly known as: Lipitor Take 1 tablet (20 mg total)  by mouth daily. What changed: when to take this   dorzolamide-timolol 22.3-6.8 MG/ML ophthalmic solution Commonly known as: COSOPT Place 1 drop into both eyes 2 (two) times daily.   famotidine 20 MG tablet Commonly known as: PEPCID TAKE 1 TABLET BY MOUTH EVERY DAY   latanoprost 0.005 % ophthalmic solution Commonly known as: XALATAN Place 1 drop into both eyes at bedtime.   lidocaine-prilocaine cream Commonly known as: EMLA Apply 1 application topically as needed. Place on the port one hour before appointment.   meclizine 12.5 MG tablet Commonly known as: ANTIVERT TAKE 1 TABLET BY MOUTH 3 TIMES DAILY AS NEEDED FOR DIZZINESS. What changed: See the new instructions.   multivitamin with minerals Tabs tablet Take 1 tablet by mouth in the morning. Men's One-A-Day Chewable   ondansetron 8 MG tablet Commonly known as: Zofran Take 1 tablet (8 mg total) by mouth 2 (two) times daily as needed (Nausea or vomiting).   senna-docusate 8.6-50 MG tablet Commonly known as: Senokot S Take 1 tablet by mouth daily.   Systane Ultra 0.4-0.3 % Soln Generic drug: Polyethyl Glycol-Propyl Glycol Apply 1 drop to eye 3 (three) times daily as needed (dry eyes).   traMADol 50 MG tablet  Commonly known as: ULTRAM Take 2 tablets (100 mg total) by mouth at bedtime as needed for moderate pain or severe pain.        Allergies: No Known Allergies  Past Medical History, Surgical history, Social history, and Family History were reviewed and updated.  Review of Systems: Review of Systems  Constitutional: Negative.   HENT: Negative.    Eyes: Negative.   Respiratory: Negative.    Cardiovascular: Negative.   Gastrointestinal:  Positive for abdominal pain.  Genitourinary: Negative.   Musculoskeletal: Negative.   Skin: Negative.   Neurological:  Positive for tingling.  Endo/Heme/Allergies: Negative.   Psychiatric/Behavioral: Negative.      Physical Exam:  weight is 175 lb (79.4 kg). His oral  temperature is 98.4 F (36.9 C). His blood pressure is 125/74 and his pulse is 82. His respiration is 16 and oxygen saturation is 98%.   Wt Readings from Last 3 Encounters:  09/14/20 175 lb (79.4 kg)  09/09/20 185 lb 13.6 oz (84.3 kg)  09/04/20 185 lb 12.8 oz (84.3 kg)    Physical Activity: Not on file    Physical Exam Vitals reviewed.  HENT:     Head: Normocephalic and atraumatic.  Eyes:     Pupils: Pupils are equal, round, and reactive to light.  Cardiovascular:     Rate and Rhythm: Normal rate and regular rhythm.     Heart sounds: Normal heart sounds.  Pulmonary:     Effort: Pulmonary effort is normal.     Breath sounds: Normal breath sounds.  Abdominal:     General: Bowel sounds are normal.     Palpations: Abdomen is soft.     Comments: Abdominal exam is soft.  He has well-healed laparotomy scars.  There is no tenderness to palpation.  He has no guarding or rebound tenderness.  There is no fluid wave.  There is no palpable liver or spleen tip.  Genitourinary:    Comments: On his rectal exam, he does have an external hemorrhoid.  This is not thrombosed or bleeding.  His rectal vault is smooth.  He has very little stool.  It is heme positive. Musculoskeletal:        General: No tenderness or deformity. Normal range of motion.     Cervical back: Normal range of motion.  Lymphadenopathy:     Cervical: No cervical adenopathy.  Skin:    General: Skin is warm and dry.     Findings: No erythema or rash.  Neurological:     Mental Status: He is alert and oriented to person, place, and time.  Psychiatric:        Behavior: Behavior normal.        Thought Content: Thought content normal.        Judgment: Judgment normal.    Lab Results  Component Value Date   WBC 5.6 09/14/2020   HGB 10.9 (L) 09/14/2020   HCT 32.9 (L) 09/14/2020   MCV 86.4 09/14/2020   PLT 197 09/14/2020   Lab Results  Component Value Date   FERRITIN 1,251 (H) 09/04/2020   IRON 108 09/04/2020   TIBC  296 09/04/2020   UIBC 189 09/04/2020   IRONPCTSAT 36 09/04/2020   Lab Results  Component Value Date   RETICCTPCT 1.6 09/04/2020   RBC 3.81 (L) 09/14/2020   No results found for: KPAFRELGTCHN, LAMBDASER, KAPLAMBRATIO No results found for: IGGSERUM, IGA, IGMSERUM No results found for: TOTALPROTELP, ALBUMINELP, A1GS, A2GS, BETS, BETA2SER, [place], [place], SPEI   Chemistry  Component Value Date/Time   NA 131 (L) 09/14/2020 1200   K 3.8 09/14/2020 1200   CL 97 (L) 09/14/2020 1200   CO2 26 09/14/2020 1200   BUN 16 09/14/2020 1200   CREATININE 0.78 09/14/2020 1200   CREATININE 1.06 07/16/2012 1038      Component Value Date/Time   CALCIUM 9.2 09/14/2020 1200   ALKPHOS 301 (H) 09/14/2020 1200   AST 67 (H) 09/14/2020 1200   ALT 119 (H) 09/14/2020 1200   BILITOT 4.7 (HH) 09/14/2020 1200       Impression and Plan: Mr. Zeimet is a very pleasant 80 yo African American gentleman with stage IIIA (T3N2M0) adenocarcinoma of the stomach, HER2-/PD-L1+. He had a distal gastrectomy on 03/19/2019 and was treated with adjuvant  FOLFOX.   He developed metastatic disease.  I am trying to help with the biliary obstruction.  Again we will see if we cannot try him on radiation with gemcitabine.  I think this would be very reasonable.  I forgot to mention that he had some GI bleeding.  He had been on Taxol/Cyramza.  He is only on Cyramza for 1 cycle.  Again, I think that gemcitabine weekly with radiation would not be a bad idea.    Our goal here is clearly quality of life.  Hopefully, we can improve his quality of life but help with the biliary obstruction.  When I spoke with Dr. Sondra Come of Radiation Oncology, it sounds like he will go with 15 cycles of radiation.  This sounds reasonable.  We will plan to try to start in 1 week.  I would think that maybe 3 weeks of gemcitabine along with radiation would do the job.  I will see him back myself in another couple weeks or so.    Volanda Napoleon,  MD 6/13/202212:51 PM

## 2020-09-15 ENCOUNTER — Telehealth: Payer: Self-pay | Admitting: *Deleted

## 2020-09-15 LAB — T4: T4, Total: 11.1 ug/dL (ref 4.5–12.0)

## 2020-09-15 LAB — TSH: TSH: 3.401 u[IU]/mL (ref 0.320–4.118)

## 2020-09-15 NOTE — Telephone Encounter (Signed)
Pt wife called with concerns of blood in the abdominal bag. Unable to reach pt or wife, lmovm to call back with more information.

## 2020-09-16 ENCOUNTER — Encounter: Payer: Self-pay | Admitting: Radiation Oncology

## 2020-09-16 ENCOUNTER — Ambulatory Visit
Admission: RE | Admit: 2020-09-16 | Discharge: 2020-09-16 | Disposition: A | Discharge status: Home / Self Care | Payer: Medicare PPO | Source: Ambulatory Visit | Attending: Radiation Oncology | Admitting: Radiation Oncology

## 2020-09-16 ENCOUNTER — Other Ambulatory Visit: Payer: Self-pay

## 2020-09-16 VITALS — BP 113/72 | HR 87 | Temp 98.0°F | Resp 20 | Ht 68.0 in | Wt 174.4 lb

## 2020-09-16 DIAGNOSIS — I10 Essential (primary) hypertension: Secondary | ICD-10-CM | POA: Diagnosis not present

## 2020-09-16 DIAGNOSIS — C787 Secondary malignant neoplasm of liver and intrahepatic bile duct: Secondary | ICD-10-CM | POA: Insufficient documentation

## 2020-09-16 DIAGNOSIS — E119 Type 2 diabetes mellitus without complications: Secondary | ICD-10-CM | POA: Insufficient documentation

## 2020-09-16 DIAGNOSIS — C772 Secondary and unspecified malignant neoplasm of intra-abdominal lymph nodes: Secondary | ICD-10-CM | POA: Diagnosis not present

## 2020-09-16 DIAGNOSIS — C162 Malignant neoplasm of body of stomach: Secondary | ICD-10-CM

## 2020-09-16 DIAGNOSIS — C7989 Secondary malignant neoplasm of other specified sites: Secondary | ICD-10-CM | POA: Diagnosis not present

## 2020-09-16 DIAGNOSIS — Z87891 Personal history of nicotine dependence: Secondary | ICD-10-CM | POA: Insufficient documentation

## 2020-09-16 DIAGNOSIS — N200 Calculus of kidney: Secondary | ICD-10-CM | POA: Diagnosis not present

## 2020-09-16 DIAGNOSIS — K831 Obstruction of bile duct: Secondary | ICD-10-CM | POA: Diagnosis not present

## 2020-09-16 DIAGNOSIS — M5136 Other intervertebral disc degeneration, lumbar region: Secondary | ICD-10-CM | POA: Insufficient documentation

## 2020-09-16 DIAGNOSIS — R000 Tachycardia, unspecified: Secondary | ICD-10-CM | POA: Diagnosis not present

## 2020-09-16 DIAGNOSIS — N400 Enlarged prostate without lower urinary tract symptoms: Secondary | ICD-10-CM | POA: Insufficient documentation

## 2020-09-16 DIAGNOSIS — Z79899 Other long term (current) drug therapy: Secondary | ICD-10-CM | POA: Diagnosis not present

## 2020-09-16 DIAGNOSIS — E785 Hyperlipidemia, unspecified: Secondary | ICD-10-CM | POA: Insufficient documentation

## 2020-09-16 MED ORDER — ONDANSETRON HCL 8 MG PO TABS: 8.0000 mg | ORAL_TABLET | Freq: Three times a day (TID) | ORAL | 3 refills | Status: AC | PRN

## 2020-09-16 NOTE — Progress Notes (Signed)
GI Location of Tumor / Histology: Malignant biliary obstruction secondary to Gastric Adenocarcinoma  CT Abdomen 09/10/2020: Marked distension of the gallbladder with minimal intrahepatic biliary ductal dilatation, similar to MRCP performed 09/04/2020.  Redemonstrated suspected peritoneal metastatic disease including a subcapsular implant adjacent to the left lobe of the liver. Previously questioned pancreatic head mass is not well demonstrated on the present examination though resolution should not assumed on the basis of this examination.  Similar appearing indeterminate hepatic lesions at least one of which was found to be hypermetabolic on PET-CT performed 06/25/2020.  MRI Abdomen 09/04/2020: New diffuse biliary ductal dilatation due to soft tissue mass in the porta hepatis adjacent to pancreatic head. This most likely represents metastatic lymphadenopathy.  Small mass in the pancreatic body, most likely representing pancreatic metastasis or adjacent peripancreatic lymphadenopathy.  Multiple sub-cm enhancing soft tissue nodules along the capsular surface of the left hepatic lobe and bilateral upper quadrant omental fat, consistent with peritoneal metastases.  Biopsies 03/19/2019    Past/Anticipated interventions by surgeon, if any:  Dr. Rush Landmark  -Endoscopic retrograde cholangiopancreatography 09/09/2020  -Distal gastrectomy 03/19/2019-Dr. Raphael Gibney  Past/Anticipated interventions by medical oncology, if any:  Dr. Marin Olp 09/11/2020 - He has a malignant biliary obstruction because of metastatic gastric adenocarcinoma. -I think the question now is how we try to treat this.  He has had only 1 cycle of chemotherapy with the Taxol.  We can certainly continue along with this. -I think another option might be some radiation therapy to the right upper quadrant.  Again I know he has metastatic disease but we are trying to help with prevention of complications from his cancer.  We can certainly try  radiation therapy.  I could give him some Xeloda at the radiation sensitizer.  Current Therapy:    Taxol/Cyramza -- start cycle #1 on 07/20/2020 - Cyramza d/c due to GI bleed.   Gemzar/XRT -- start on 09/18/2020  Adjuvant FOLFOX - started on 05/13/2019, s/p cycle #8 -completed on Dec 11, 2019  Weight changes, if any: Minimal weight loss over the past month, maybe a pound or two.  Bowel/Bladder complaints, if any: Has occasional pressure when trying to urinate.  Constipation is getting better, taking senokot daily.  Nausea / Vomiting, if any: Had bad nausea spell yesterday morning.  Pain issues, if any:  None noted now.  He notes some pain across his abdomen when laying down.  States his abdomen feels like its swollen.   SAFETY ISSUES: Prior radiation? No Pacemaker/ICD? No Possible current pregnancy? N/a Is the patient on methotrexate? No  Current Complaints/Details:

## 2020-09-16 NOTE — Progress Notes (Signed)
Radiation Oncology         ([phone]) [phone] ________________________________  Name: Jonathan Lewis        MRN: [id number]  Date of Service: 09/16/2020 DOB: Jan 24, 1941  HE:RDEY, Hunt Oris, MD  Volanda Napoleon, MD     REFERRING PHYSICIAN: Volanda Napoleon, MD   DIAGNOSIS: The primary encounter diagnosis was Malignant neoplasm of body of stomach ([place]). A diagnosis of Malignant neoplasm metastatic to intra-abdominal lymph node ([place]) was also pertinent to this visit.   HISTORY OF PRESENT ILLNESS: Jonathan Lewis is a 80 y.o. male seen at the request of Dr. 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  The patient was originally diagnosed in late 2020 and underwent surgical resection of the distal stomach and duodenum revealing a 3 cm invasive adenocarcinoma that was moderately differentiated invading into the perigastric adipose tissue with perineural invasion and 2 of 20 lymph nodes positive.  Given these findings he received adjuvant chemotherapy and his last treatment was in September 2021.  He was being followed in surveillance however in March 2022 imaging findings identified concerns for recurrent disease in the liver and pancreas.  A PET scan on 06/25/2020 showed concerns for metastatic disease in the liver and omentum and pancreatic body.  There were stable hypermetabolic mediastinal and hilar lymph nodes as well.  He was started on IV chemotherapy with Taxol and Cyramza in April 2022, he had low blood counts, and subsequently developed new onset of jaundice and was seen in the emergency department.  An MRCP on 09/04/2020 showed new developed diffuse biliary ductal dilatation due to a soft tissue mass in the porta hepatis adjacent to the pancreatic head concerning for metastatic adenopathy a small mass in the pancreatic body representing pancreatic metastasis and multiple subcentimeter soft tissue nodules along the capsular surface of the left hepatic lobe and  upper lobe quadrant both on the right and left of the abdominal fat concerning for peritoneal metastases.  He underwent biliary drain placement after ERCP was attempted but could not be performed.  The first attempted IR biliary drain placement was also unsuccessful but was able to be performed on 09/10/2020 with the second attempt.  He met back with Dr. Marin Olp when he was discharged from the hospital and was seen on Monday, the plan is to resume IV chemotherapy with gemcitabine which she is scheduled to receive this Friday.  He is seen today to discuss palliative radiotherapy to the nodal region in the porta hepatis to hopefully reduce the biliary tree obstruction and improve symptoms of discomfort.    PREVIOUS RADIATION THERAPY: No   PAST MEDICAL HISTORY:  Past Medical History:  Diagnosis Date   Acoustic neuroma ([place]) 06/25/2015   Allergic rhinitis 07/18/2014   Allergy    Anemia    Arthritis    BPH (benign prostatic hyperplasia)    Cancer ([place])    stage III stomach-Dx 03/2019   Carbuncle    recurrent MRSA carbuncles   Cataract    Per pt bilateral cataracts removed.   Diabetes mellitus    Type II. Per pt Dr. Jenny Reichmann took him off his dm med.   Disk prolapse    Glaucoma    Hyperlipidemia    Hypertension    Male hypogonadism 07/16/2014   Neuropathy    Pneumonia    Sinus bradycardia    chronic, asymptomatic   Sinusitis 07/29/2012       PAST SURGICAL HISTORY: Past Surgical History:  Procedure  Laterality Date   ANTERIOR CERVICAL DECOMPRESSION/DISCECTOMY FUSION 4 LEVELS N/A 01/08/2020   Procedure: ANTERIOR CERVICAL DECOMPRESSION FUSION CERVICAL 3-4, CERVICAL 4-5, CERVIAL 5-6 WITH INSTRUMENTATION AND ALLOGRAFT;  Surgeon: Phylliss Bob, MD;  Location: [facility];  Service: Orthopedics;  Laterality: N/A;   BALLOON DILATION N/A 03/12/2019   Procedure: BALLOON DILATION;  Surgeon: Rush Landmark Telford Nab., MD;  Location: [facility];  Service: Gastroenterology;  Laterality: N/A;  pyloric   BIOPSY   03/08/2019   Procedure: BIOPSY;  Surgeon: Yetta Flock, MD;  Location: [facility];  Service: Gastroenterology;;   BIOPSY  03/12/2019   Procedure: BIOPSY;  Surgeon: Irving Copas., MD;  Location: [facility];  Service: Gastroenterology;;   CATARACT EXTRACTION     x 2   COLONOSCOPY     ENDOSCOPIC RETROGRADE CHOLANGIOPANCREATOGRAPHY (ERCP) WITH PROPOFOL N/A 09/09/2020   Procedure: ENDOSCOPIC RETROGRADE CHOLANGIOPANCREATOGRAPHY (ERCP) WITH PROPOFOL;  Surgeon: Irving Copas., MD;  Location: [facility];  Service: Gastroenterology;  Laterality: N/A;   ESOPHAGOGASTRODUODENOSCOPY N/A 03/08/2019   Procedure: ESOPHAGOGASTRODUODENOSCOPY (EGD);  Surgeon: Yetta Flock, MD;  Location: [facility];  Service: Gastroenterology;  Laterality: N/A;   ESOPHAGOGASTRODUODENOSCOPY (EGD) WITH PROPOFOL N/A 03/12/2019   Procedure: ESOPHAGOGASTRODUODENOSCOPY (EGD) WITH PROPOFOL;  Surgeon: Rush Landmark Telford Nab., MD;  [facility]: [facility];  Service: Gastroenterology;  Laterality: N/A;   FINE NEEDLE ASPIRATION  03/12/2019   Procedure: FINE NEEDLE ASPIRATION (FNA) LINEAR;  Surgeon: Irving Copas., MD;  Location: [facility];  Service: Gastroenterology;;   FOREIGN BODY REMOVAL  03/08/2019   Procedure: FOREIGN BODY REMOVAL;  Surgeon: Yetta Flock, MD;  Location: [facility];  Service: Gastroenterology;;   IR BILIARY DRAIN PLACEMENT WITH CHOLANGIOGRAM  09/09/2020   IR INT EXT BILIARY DRAIN WITH CHOLANGIOGRAM  09/10/2020   IR PATIENT EVAL TECH 0-60 MINS  04/03/2019   LAPAROTOMY N/A 03/19/2019   Procedure: EXPLORATORY LAPAROTOMY, distal GASTRECTOMY AND PLACEMENT OF G AND J TUBE, gastric jejunostomy;  Surgeon: Greer Pickerel, MD;  Location: [facility];  Service: General;  Laterality: N/A;   POLYPECTOMY     PORTACATH PLACEMENT N/A 03/26/2019   Procedure: INSERTION PORT-A-CATH;  Surgeon: Michael Boston, MD;  Location: [facility];  Service: General;  Laterality: N/A;   [place] INJECTION  09/09/2020   Procedure: SUBMUCOSAL TATTOO INJECTION;  Surgeon: Irving Copas., MD;  Location: [facility];  Service: Gastroenterology;;   UPPER ESOPHAGEAL ENDOSCOPIC ULTRASOUND (EUS) N/A 03/12/2019   Procedure: UPPER ESOPHAGEAL ENDOSCOPIC ULTRASOUND (EUS);  Surgeon: Irving Copas., MD;  Location: [facility];  Service: Gastroenterology;  Laterality: N/A;   UPPER GASTROINTESTINAL ENDOSCOPY     VIDEO BRONCHOSCOPY WITH ENDOBRONCHIAL ULTRASOUND N/A 05/22/2019   Procedure: VIDEO BRONCHOSCOPY WITH ENDOBRONCHIAL ULTRASOUND;  Surgeon: Garner Nash, DO;  Location: [facility];  Service: Thoracic;  Laterality: N/A;     FAMILY HISTORY:  Family History  Problem Relation Age of Onset   Cancer Mother    Hypertension Mother    Diabetes Sister    Cancer Brother        colon   Hypertension Brother    Hyperlipidemia Brother    Stroke Brother    Hypertension Sister    Colon cancer Neg Hx    Heart attack Neg Hx    Sudden death Neg Hx    Esophageal cancer Neg Hx    Pancreatic cancer Neg Hx    Stomach cancer Neg Hx    Rectal cancer Neg Hx      SOCIAL HISTORY:  reports that he quit smoking about 41 years ago. His smoking use included cigarettes. He has a 24.00 pack-year smoking history. He has never used smokeless tobacco. He reports previous alcohol use. He reports that he does not use drugs.   ALLERGIES: Patient has no known allergies.   MEDICATIONS:  Current Outpatient Medications  Medication Sig Dispense Refill   atorvastatin (LIPITOR) 20 MG tablet Take 1 tablet (20 mg total) by mouth daily. (Patient taking differently: Take 20 mg by mouth in the morning.) 90 tablet 3   dorzolamide-timolol (COSOPT) 22.3-6.8 MG/ML ophthalmic solution Place 1 drop into both eyes 2 (two) times daily.     famotidine (PEPCID) 20 MG tablet TAKE 1 TABLET BY MOUTH EVERY DAY 90 tablet 0   latanoprost (XALATAN) 0.005 % ophthalmic solution Place 1 drop into both eyes at  bedtime.      lidocaine-prilocaine (EMLA) cream Apply 1 application topically as needed. Place on the port one hour before appointment. 30 g 3   meclizine (ANTIVERT) 12.5 MG tablet TAKE 1 TABLET BY MOUTH 3 TIMES DAILY AS NEEDED FOR DIZZINESS. (Patient taking differently: Take 12.5 mg by mouth in the morning.) 30 tablet 2   Multiple Vitamin (MULTIVITAMIN WITH MINERALS) TABS tablet Take 1 tablet by mouth in the morning. Men's One-A-Day Chewable     ondansetron (ZOFRAN) 8 MG tablet Take 1 tablet (8 mg total) by mouth every 8 (eight) hours as needed for nausea or vomiting. 30 tablet 3   senna-docusate (SENOKOT S) 8.6-50 MG tablet Take 1 tablet by mouth daily. 30 tablet 0   SYSTANE ULTRA 0.4-0.3 % SOLN Apply 1 drop to eye 3 (three) times daily as needed (dry eyes).      traMADol (ULTRAM) 50 MG tablet Take 2 tablets (100 mg total) by mouth at bedtime as needed for moderate pain or severe pain. 60 tablet 5   No current facility-administered medications for this encounter.     REVIEW OF SYSTEMS: On review of systems, the patient reports that he is feeling pretty well overall.  He states that he has been weak and tired as he has been having struggles with nausea and inability to eat a regular amount of food.  His granddaughter states that he still trying to eat softer foods and liquid-based nutrition as well.  He reports nighttime discomfort when lying flat, she weeks.  He has an old prescription from prior chemotherapy for antiemetics but cannot recall taking.  This has helped, he is trying to keep track of the output biliary drain, he is waiting to hear back from interventional radiology about when they would like to reevaluate this.  No other complaints are verbalized.     PHYSICAL EXAM:  Wt Readings from Last 3 Encounters:  09/16/20 174 lb 6.4 oz (79.1 kg)  09/14/20 175 lb (79.4 kg)  09/09/20 185 lb 13.6 oz (84.3 kg)   Temp Readings from Last 3 Encounters:  09/16/20 98 F (36.7 C)  09/14/20 98.4  F (36.9 C) (Oral)  09/12/20 98.9 F (37.2 C) (Oral)   BP Readings from Last 3 Encounters:  09/16/20 113/72  09/14/20 125/74  09/12/20 (!) 159/90   Pulse Readings from Last 3 Encounters:  09/16/20 87  09/14/20 82  09/12/20 76   Pain Assessment Pain Score: 0-No pain/10  In general this is a elderly appearing African American in no acute distress.  He's alert and oriented x4 and appropriate throughout the examination. Cardiopulmonary assessment is negative for acute distress and he exhibits normal  effort.     ECOG = 2  0 - Asymptomatic (Fully active, able to carry on all predisease activities without restriction)  1 - Symptomatic but completely ambulatory (Restricted in physically strenuous activity but ambulatory and able to carry out work of a light or sedentary nature. For example, light housework, office work)  2 - Symptomatic, <50% in bed during the day (Ambulatory and capable of all self care but unable to carry out any work activities. Up and about more than 50% of waking hours)  3 - Symptomatic, >50% in bed, but not bedbound (Capable of only limited self-care, confined to bed or chair 50% or more of waking hours)  4 - Bedbound (Completely disabled. Cannot carry on any self-care. Totally confined to bed or chair)  5 - Death   Eustace Pen MM, Creech RH, Tormey DC, et al. [phone]). "Toxicity and response criteria of the [facility]". [place] Oncol. 5 (6): 649-55    LABORATORY DATA:  Lab Results  Component Value Date   WBC 5.6 09/14/2020   HGB 10.9 (L) 09/14/2020   HCT 32.9 (L) 09/14/2020   MCV 86.4 09/14/2020   PLT 197 09/14/2020   Lab Results  Component Value Date   NA 131 (L) 09/14/2020   K 3.8 09/14/2020   CL 97 (L) 09/14/2020   CO2 26 09/14/2020   Lab Results  Component Value Date   ALT 119 (H) 09/14/2020   AST 67 (H) 09/14/2020   ALKPHOS 301 (H) 09/14/2020   BILITOT 4.7 (HH) 09/14/2020      RADIOGRAPHY: CT ABDOMEN PELVIS W  CONTRAST  Result Date: 09/11/2020 CLINICAL DATA:  History of gastric cancer, now with elevated bilirubin post failed attempt at endoscopic biliary stent placement as well as percutaneous biliary drainage catheter placement. Please perform abdominal CT for potential repeat attempt at internal/external biliary drainage catheter placement. EXAM: CT ABDOMEN AND PELVIS WITH CONTRAST TECHNIQUE: Multidetector CT imaging of the abdomen and pelvis was performed using the standard protocol following bolus administration of intravenous contrast. CONTRAST:  165m OMNIPAQUE IOHEXOL 300 MG/ML  SOLN COMPARISON:  MRCP-09/04/2020; ERCP-09/09/2020; PET-CT-06/25/2020 FINDINGS: Lower chest: Limited visualization of the lower thorax demonstrates minimal dependent subpleural ground-glass atelectasis, right greater than left. There is a punctate granuloma within the right middle lobe. No discrete focal airspace opacities. Borderline cardiomegaly. No pericardial effusion. Port a catheter tip terminates within the superior cavoatrial junction. Hepatobiliary: Redemonstrated marked distension of the gallbladder with very minimal intrahepatic biliary ductal dilatation, similar to abdominal MRCP performed 09/04/2020. No definitive gallbladder wall thickening or pericholecystic stranding. Scattered foci air at the level of the porta hepatis and minimal amount of ill-defined stranding adjacent to the anterior aspect the left lobe of the liver and right lateral body wall, the result recent attempted percutaneous biliary drainage catheter placement. Approximately 1.2 cm nodule adjacent to the anterior aspect the lobe liver image 11, series 7 similar to recent abdominal MRI and again favored to represent a hepatic capsular implant. There is a punctate (approximately 0.6 cm) hypoattenuating lesion with the dome of the right lobe of the liver (image 11, series 2, which is too small to accurately characterize favored to represent a hepatic cyst.  Additional subcentimeter hypoattenuating hepatic lesions (images 21 and 31, series 2) too small to accurately characterize remain indeterminate. Pancreas: Previously question hypovascular involving the pancreatic body on recent abdominal MRI is not well demonstrated the present examination. No definitive downstream pancreatic ductal dilatation or peripancreatic stranding. Spleen: Normal appearance of the  spleen. Adrenals/Urinary Tract: There is symmetric enhancement and excretion of the bilateral kidneys. There is a punctate (approximately 5 mm) nonobstructing stone within the inferior pole the right kidney image 34, series 2). No discrete left-sided renal stones on this postcontrast examination. No renal stones are seen along expected course of either ureter or the urinary bladder. Mild thickening of the urinary bladder wall, potentially accentuated due to underdistention. Redemonstrated small left-sided extrarenal pelvis. No evidence of urinary obstruction. Normal appearance the bilateral adrenal glands. Stomach/Bowel: Stable postoperative change the stomach compatible with history partial gastric resection. Moderate colonic stool burden without evidence of enteric obstruction. Scattered colonic diverticulosis without evidence of superimposed acute diverticulitis. Normal appearance of the terminal ileum and the retrocecal appendix. No discrete areas of bowel wall thickening. No pneumoperitoneum, pneumatosis or portal venous gas. Vascular/Lymphatic: Normal caliber of the aorta. There is a very minimal amount of involving the bilateral common iliac and femoral arteries, not resulting in hemodynamically significant stenosis. The major branch vessels the aorta appear patent on this non CTA examination. No bulky retroperitoneal, pelvic or inguinal lymphadenopathy. Reproductive: Dystrophic calcifications within a borderline enlarged prostate gland. No free fluid the pelvic cul-de-sac. Other: Redemonstrated scattered  peritoneal nodules with dominant nodule within the right upper abdominal quadrant measuring 1.4 x 1.1 cm (image 38, series 2). Additional peritoneal nodules are seen on images 27, 38, 43, 46 and 47, series 2. Musculoskeletal: No acute or aggressive osseous abnormalities. Redemonstrated bilateral L5 pars defects associated anterolisthesis. Mild-to-moderate multilevel lumbar spine DDD, worse at L4-L5 with disc space height loss, endplate irregularity and posterior directed osteophytosis. Mild degenerative change the hips with joint space loss, subchondral sclerosis and osteophytosis. No evidence. IMPRESSION: 1. Marked distension of the gallbladder with minimal intrahepatic biliary ductal dilatation, similar to MRCP performed 09/04/2020. 2. Redemonstrated suspected peritoneal metastatic disease including a subcapsular implant adjacent to the left lobe of the liver. 3. Previously questioned pancreatic head mass is not well demonstrated on the present examination though resolution should not assumed on the basis of this examination. 4. Similar appearing indeterminate hepatic lesions at least one of which was found to be hypermetabolic on PET-CT performed 06/25/2020. 5. Solitary punctate (5 mm) nonobstructing right-sided renal stone. Electronically Signed   By: Sandi Mariscal M.D.   On: 09/11/2020 08:29   MR 3D Recon At Scanner  Result Date: 09/04/2020 CLINICAL DATA:  New onset jaundice.  Metastatic gastric carcinoma. EXAM: MRI ABDOMEN WITHOUT AND WITH CONTRAST (INCLUDING MRCP) TECHNIQUE: Multiplanar multisequence MR imaging of the abdomen was performed both before and after the administration of intravenous contrast. Heavily T2-weighted images of the biliary and pancreatic ducts were obtained, and three-dimensional MRCP images were rendered by post processing. CONTRAST:  29m GADAVIST GADOBUTROL 1 MMOL/ML IV SOLN COMPARISON:  CT on 06/12/2020 FINDINGS: Lower chest: No acute findings. Hepatobiliary: No intrahepatic masses  are seen, however there are a few sub-cm enhancing soft tissue nodules along the capsular surface of the left hepatic lobe consistent with peritoneal metastases. The gallbladder is distended and diffuse intra and extra hepatic biliary ductal dilatation is now seen, with common bile duct measuring 9 mm. Obstruction of the common bile duct is seen in the porta hepatis, due to an irregular soft tissue mass measuring approximately 4 x 3 cm on image 45/41. This most likely represents metastatic lymphadenopathy. Pancreas: A subtle hypovascular mass is seen in the pancreatic body which measures 2.3 x 1.9 cm on image 47/27. This may represent a pancreatic metastasis or adjacent peripancreatic lymphadenopathy. Spleen:  Within normal limits in size and appearance. Adrenals/Urinary Tract: No masses identified. No evidence of hydronephrosis. Stomach/Bowel: Prior gastric jejunostomy again noted. Otherwise unremarkable. Vascular/Lymphatic: See above findings in hepatobiliary and pancreas sections. Other: Multiple small enhancing peritoneal and omental soft tissue nodules are again seen in the anterior right and left upper quadrants, consistent with peritoneal metastases. Musculoskeletal:  No suspicious bone lesions identified. IMPRESSION: New diffuse biliary ductal dilatation due to soft tissue mass in the porta hepatis adjacent to pancreatic head. This most likely represents metastatic lymphadenopathy. Small mass in the pancreatic body, most likely representing pancreatic metastasis or adjacent peripancreatic lymphadenopathy. Multiple sub-cm enhancing soft tissue nodules along the capsular surface of the left hepatic lobe and bilateral upper quadrant omental fat, consistent with peritoneal metastases. Electronically Signed   By: Marlaine Hind M.D.   On: 09/04/2020 21:06   DG ERCP  Result Date: 09/09/2020 CLINICAL DATA:  Attempted ERCP EXAM: ERCP TECHNIQUE: Multiple spot images obtained with the fluoroscopic device and  submitted for interpretation post-procedure. FLUOROSCOPY TIME:  Fluoroscopy Time:  3 minutes 38 seconds Number of Acquired Spot Images: 0 COMPARISON:  MRCP 09/04/2020 FINDINGS: A total of 20 intraoperative saved images are submitted for review. The images demonstrate a flexible duodenal scope in various positions within the duodenum and proximal jejunum. No evidence of immediate complication. IMPRESSION: Attempted ERCP. These images were submitted for radiologic interpretation only. Please see the procedural report for the amount of contrast and the fluoroscopy time utilized. Electronically Signed   By: Jacqulynn Cadet M.D.   On: 09/09/2020 14:47   MR ABDOMEN MRCP W WO CONTAST  Result Date: 09/04/2020 CLINICAL DATA:  New onset jaundice.  Metastatic gastric carcinoma. EXAM: MRI ABDOMEN WITHOUT AND WITH CONTRAST (INCLUDING MRCP) TECHNIQUE: Multiplanar multisequence MR imaging of the abdomen was performed both before and after the administration of intravenous contrast. Heavily T2-weighted images of the biliary and pancreatic ducts were obtained, and three-dimensional MRCP images were rendered by post processing. CONTRAST:  7m GADAVIST GADOBUTROL 1 MMOL/ML IV SOLN COMPARISON:  CT on 06/12/2020 FINDINGS: Lower chest: No acute findings. Hepatobiliary: No intrahepatic masses are seen, however there are a few sub-cm enhancing soft tissue nodules along the capsular surface of the left hepatic lobe consistent with peritoneal metastases. The gallbladder is distended and diffuse intra and extra hepatic biliary ductal dilatation is now seen, with common bile duct measuring 9 mm. Obstruction of the common bile duct is seen in the porta hepatis, due to an irregular soft tissue mass measuring approximately 4 x 3 cm on image 45/41. This most likely represents metastatic lymphadenopathy. Pancreas: A subtle hypovascular mass is seen in the pancreatic body which measures 2.3 x 1.9 cm on image 47/27. This may represent a  pancreatic metastasis or adjacent peripancreatic lymphadenopathy. Spleen:  Within normal limits in size and appearance. Adrenals/Urinary Tract: No masses identified. No evidence of hydronephrosis. Stomach/Bowel: Prior gastric jejunostomy again noted. Otherwise unremarkable. Vascular/Lymphatic: See above findings in hepatobiliary and pancreas sections. Other: Multiple small enhancing peritoneal and omental soft tissue nodules are again seen in the anterior right and left upper quadrants, consistent with peritoneal metastases. Musculoskeletal:  No suspicious bone lesions identified. IMPRESSION: New diffuse biliary ductal dilatation due to soft tissue mass in the porta hepatis adjacent to pancreatic head. This most likely represents metastatic lymphadenopathy. Small mass in the pancreatic body, most likely representing pancreatic metastasis or adjacent peripancreatic lymphadenopathy. Multiple sub-cm enhancing soft tissue nodules along the capsular surface of the left hepatic lobe and bilateral upper quadrant  omental fat, consistent with peritoneal metastases. Electronically Signed   By: Marlaine Hind M.D.   On: 09/04/2020 21:06   IR BILIARY DRAIN PLACEMENT WITH CHOLANGIOGRAM  Result Date: 09/10/2020 INDICATION: History of gastric cancer, now with obstructive jaundice, post failed attempt at ERCP biliary stent placement. EXAM: ATTEMPTED THOUGH ULTIMATELY UNSUCCESSFUL ULTRASOUND AND FLUOROSCOPIC GUIDED PERCUTANEOUS TRANSHEPATIC CHOLANGIOGRAM AND BILIARY TUBE PLACEMENT COMPARISON:  MRCP-09/04/2020; PET-CT-06/26/2018 MEDICATIONS: Cefoxitin 2 gm IV; The antibiotic was administered with an appropriate time frame prior to the initiation of the procedure CONTRAST:  45 mL OMNIPAQUE IOHEXOL 300 MG/ML  SOLN ANESTHESIA/SEDATION: Moderate (conscious) sedation was employed during this procedure. A total of Versed 3 mg and Fentanyl 100 mcg was administered intravenously. Moderate Sedation Time: 88 minutes. The patient's level of  consciousness and vital signs were monitored continuously by radiology nursing throughout the procedure under my direct supervision. FLUOROSCOPY TIME:  10 minutes, 54 seconds (532 mGy) COMPLICATIONS: None immediate. TECHNIQUE: Informed written consent was obtained from the patient after a discussion of the risks, benefits and alternatives to treatment. Questions regarding the procedure were encouraged and answered. A timeout was performed prior to the initiation of the procedure. The right upper abdominal quadrant was prepped and draped in the usual sterile fashion, and a sterile drape was applied covering the operative field. Maximum barrier sterile technique with sterile gowns and gloves were used for the procedure. A timeout was performed prior to the initiation of the procedure. Sonographic evaluation was performed the liver demonstrating only a small amount of peripheral and central intrahepatic biliary duct dilatation with marked distension of gallbladder. Prolonged efforts were made to cannulate a peripherally located bile duct within both the right and left lobes of the liver however despite prolonged effort, a bile duct was unable to be successfully cannulated catheterized either with ultrasound or fluoroscopic guidance secondary to lack of biliary ductal dilatation as well as poor percutaneous window due to interposition the ribs and the costal cartilage. As such the procedure was terminated. Dressings were applied. The patient tolerated the procedure well without immediate postprocedural complication. FINDINGS: Sonographic evaluation demonstrates lack of significant dilatation of either the peripheral or central aspect of the intrahepatic biliary tree and despite prolonged efforts, a bile duct was unable to be successfully opacified and/or cannulated. IMPRESSION: Attempted though ultimately unsuccessful percutaneous cholangiogram and biliary drain placement due to a combination lack significant dilatation  the central of peripheral intrahepatic biliary system as well as poor percutaneous window due to interposition of ribs and the costal cartilage. PLAN: Will recheck patient's bilirubin level tomorrow and will discuss with the providing service the appropriateness of proceeding with repeat attempt versus cholecystostomy tube placement as indicated. Electronically Signed   By: Sandi Mariscal M.D.   On: 09/10/2020 09:57   IR INT EXT BILIARY DRAIN WITH CHOLANGIOGRAM  Result Date: 09/10/2020 INDICATION: History of gastric cancer, now with obstructive jaundice, post failed attempt at ERCP biliary stent placement (due to surgical anatomy) as well as failed attempt percutaneous biliary drainage placement yesterday. As such, patient presents today for repeat attempt at internal/external biliary drainage catheter placement EXAM: ULTRASOUND AND FLUOROSCOPIC GUIDED PERCUTANEOUS TRANSHEPATIC CHOLANGIOGRAM AND BILIARY TUBE PLACEMENT COMPARISON:  Attempted internal/external biliary drainage catheter placement-09/09/2020 CT abdomen and pelvis-earlier same day MRCP-09/04/2020 PET-CT-06/25/2020 MEDICATIONS: Cefoxitin 2 gm IV; The antibiotic was administered with an appropriate time frame prior to the initiation of the procedure CONTRAST:  33m OMNIPAQUE IOHEXOL 300 MG/ML SOLN - administered into the biliary tree. ANESTHESIA/SEDATION: Moderate (conscious) sedation was employed during  this procedure. A total of Versed 2 mg and Fentanyl 100 mcg was administered intravenously. Moderate Sedation Time: 28 minutes. The patient's level of consciousness and vital signs were monitored continuously by radiology nursing throughout the procedure under my direct supervision. FLUOROSCOPY TIME:  4 minutes, 42 seconds (151 mGy close COMPLICATIONS: None immediate. TECHNIQUE: Informed written consent was obtained from the patient after a discussion of the risks, benefits and alternatives to treatment. Questions regarding the procedure were encouraged  and answered. A timeout was performed prior to the initiation of the procedure. The right upper abdominal quadrant was prepped and draped in the usual sterile fashion, and a sterile drape was applied covering the operative field. Maximum barrier sterile technique with sterile gowns and gloves were used for the procedure. A timeout was performed prior to the initiation of the procedure. Exhaustive sonographic evaluation performed of the liver by the dictating interventional radiologist ultimately delineating a mildly dilated duct within the peripheral aspect the left lobe of the liver. After the overlying soft tissues were anesthetized with 1% Lidocaine with epinephrine, under direct ultrasound guidance, a 22 gauge Chiba needle was utilized to cannulate the peripheral aspect of the targeted left intrahepatic biliary duct. Appropriate position was confirmed with limited contrast injection. Next, the duct was cannulated with a Nitrex wire and dilated with an Accustick set under fluoroscopic guidance. Limited cholangiograms were performed in various obliquities confirming appropriate access. Nitrex wire was advanced to the level of the duodenum and exchanged over a 4 French angled glide catheter for a Amplatz wire. Under intermittent fluoroscopic guidance the track was dilated ultimately allowing placement of a 10 [place] biliary drainage catheter with coil ultimately locked within the duodenum. Contrast was injected and a completion radiographs were obtained in various obliquities. The catheter was connected to a drainage bag which yielded the brisk return of clear bile. The catheter was secured to the skin with an interrupted suture and StatLock device. Dressings were applied. The patient tolerated the procedure well without immediate postprocedural complication. FINDINGS: Exhaustive sonographic evaluation performed by the dictating interventional radiologist ultimately demonstrated mildly dilated duct within the  peripheral aspect of the left lobe of the liver which was targeted under direct ultrasound guidance, ultimately allowing fluoroscopic guided placement of a 10 French percutaneous drainage catheter with end ultimately coiled and locked within the duodenum and radiopaque side marker located proximal to the level of the biliary hilum. Contrast injections demonstrate malignant narrowing/subtotal occlusion involving the mid and distal aspects of the CBD just caudal to the take-off of the cystic duct as was demonstrated on preceding MRCP. Biliary drainage catheter demonstrates opacification both the right and left intrahepatic biliary trees. IMPRESSION: Successful placement of a 10.2 French percutaneous biliary drainage catheter via left-sided approach with end coiled and locked within the duodenum. PLAN: - Recommend obtaining daily CMPs while the patient is admitted to the hospital. - Maintain the percutaneous biliary drainage catheter to external drainage until the patient's bilirubin has reached a nadir. - Once the patient's bilirubin has reached a nadir, the patient may return for definitive percutaneous cholangiogram and potential initiation of a capping trial as indicated. Note, ultimately, the patient may be a candidate for biliary stent placement as indicated. Electronically Signed   By: Sandi Mariscal M.D.   On: 09/10/2020 17:12       IMPRESSION/PLAN: 1. 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  Dr. Lisbeth Renshaw discusses the findings from his recent imaging for considering palliative  radiotherapy to the abdomen the porta hepatis node with the anticipation of alleviating changes of the biliary system and to reduce pain.  The patient will also proceed with IV chemotherapy.  Given his ongoing plans for chemo recommends a course of 3 weeks of palliative radiotherapy.  We reviewed the risks, benefits, short and long-term effects of radiotherapy and the  patient is interested in proceeding.  He is signed written consent and will come back to do simulation tomorrow.  Copies of the consent forms were given to the patient as well.  We intend to start treatment early next week.  In a visit lasting 60 minutes, greater than 50% of the time was spent face to face discussing the patient's condition, in preparation for the discussion, and coordinating the patient's care.   The above documentation reflects my direct findings during this shared patient visit. Please see the separate note by Dr. Lisbeth Renshaw on this date for the remainder of the patient's plan of care.    Carola Rhine, [facility]   **Disclaimer: This note was dictated with voice recognition software. Similar sounding words can inadvertently be transcribed and this note may contain transcription errors which may not have been corrected upon publication of note.**

## 2020-09-17 ENCOUNTER — Ambulatory Visit
Admission: RE | Admit: 2020-09-17 | Discharge: 2020-09-17 | Disposition: A | Discharge status: Home / Self Care | Payer: Medicare PPO | Source: Ambulatory Visit | Attending: Radiation Oncology | Admitting: Radiation Oncology

## 2020-09-17 ENCOUNTER — Other Ambulatory Visit: Payer: Self-pay | Admitting: *Deleted

## 2020-09-17 DIAGNOSIS — C162 Malignant neoplasm of body of stomach: Secondary | ICD-10-CM | POA: Diagnosis not present

## 2020-09-17 DIAGNOSIS — C161 Malignant neoplasm of fundus of stomach: Secondary | ICD-10-CM

## 2020-09-17 DIAGNOSIS — Z95828 Presence of other vascular implants and grafts: Secondary | ICD-10-CM

## 2020-09-17 DIAGNOSIS — C772 Secondary and unspecified malignant neoplasm of intra-abdominal lymph nodes: Secondary | ICD-10-CM | POA: Diagnosis not present

## 2020-09-17 DIAGNOSIS — C169 Malignant neoplasm of stomach, unspecified: Secondary | ICD-10-CM | POA: Diagnosis not present

## 2020-09-17 DIAGNOSIS — Z510 Encounter for antineoplastic radiation therapy: Secondary | ICD-10-CM | POA: Insufficient documentation

## 2020-09-18 ENCOUNTER — Encounter: Payer: Self-pay | Admitting: Family

## 2020-09-18 ENCOUNTER — Inpatient Hospital Stay: Payer: Medicare PPO

## 2020-09-18 ENCOUNTER — Telehealth: Payer: Self-pay | Admitting: *Deleted

## 2020-09-18 ENCOUNTER — Encounter: Payer: Self-pay | Admitting: Hematology & Oncology

## 2020-09-18 ENCOUNTER — Other Ambulatory Visit: Payer: Self-pay | Admitting: Student

## 2020-09-18 NOTE — Telephone Encounter (Signed)
This nurse called patient and spoke to wife regarding appointments for today. I told her, "that insurance denied his chemotherapy, Gemzar, and not to come in today. Per Dr. Marin Olp, he will continue to get radiation." She verbalized understanding.

## 2020-09-18 NOTE — Telephone Encounter (Signed)
See alternate patient message. 

## 2020-09-19 ENCOUNTER — Encounter (HOSPITAL_COMMUNITY): Payer: Self-pay | Admitting: Emergency Medicine

## 2020-09-19 ENCOUNTER — Emergency Department (HOSPITAL_COMMUNITY)
Admission: EM | Admit: 2020-09-19 | Discharge: 2020-09-19 | Disposition: A | Discharge status: Home / Self Care | Payer: Medicare PPO | Source: Home / Self Care | Attending: Emergency Medicine | Admitting: Emergency Medicine

## 2020-09-19 ENCOUNTER — Other Ambulatory Visit: Payer: Self-pay

## 2020-09-19 DIAGNOSIS — A4150 Gram-negative sepsis, unspecified: Secondary | ICD-10-CM | POA: Diagnosis not present

## 2020-09-19 DIAGNOSIS — C787 Secondary malignant neoplasm of liver and intrahepatic bile duct: Secondary | ICD-10-CM | POA: Diagnosis present

## 2020-09-19 DIAGNOSIS — R599 Enlarged lymph nodes, unspecified: Secondary | ICD-10-CM | POA: Diagnosis not present

## 2020-09-19 DIAGNOSIS — R509 Fever, unspecified: Secondary | ICD-10-CM | POA: Diagnosis not present

## 2020-09-19 DIAGNOSIS — R6510 Systemic inflammatory response syndrome (SIRS) of non-infectious origin without acute organ dysfunction: Secondary | ICD-10-CM | POA: Diagnosis not present

## 2020-09-19 DIAGNOSIS — K819 Cholecystitis, unspecified: Secondary | ICD-10-CM | POA: Diagnosis present

## 2020-09-19 DIAGNOSIS — K828 Other specified diseases of gallbladder: Secondary | ICD-10-CM | POA: Diagnosis not present

## 2020-09-19 DIAGNOSIS — C269 Malignant neoplasm of ill-defined sites within the digestive system: Secondary | ICD-10-CM | POA: Diagnosis not present

## 2020-09-19 DIAGNOSIS — E1140 Type 2 diabetes mellitus with diabetic neuropathy, unspecified: Secondary | ICD-10-CM | POA: Diagnosis present

## 2020-09-19 DIAGNOSIS — R1030 Lower abdominal pain, unspecified: Secondary | ICD-10-CM | POA: Diagnosis not present

## 2020-09-19 DIAGNOSIS — N401 Enlarged prostate with lower urinary tract symptoms: Secondary | ICD-10-CM | POA: Diagnosis present

## 2020-09-19 DIAGNOSIS — I10 Essential (primary) hypertension: Secondary | ICD-10-CM | POA: Diagnosis present

## 2020-09-19 DIAGNOSIS — R7989 Other specified abnormal findings of blood chemistry: Secondary | ICD-10-CM | POA: Diagnosis not present

## 2020-09-19 DIAGNOSIS — D689 Coagulation defect, unspecified: Secondary | ICD-10-CM | POA: Diagnosis present

## 2020-09-19 DIAGNOSIS — N139 Obstructive and reflux uropathy, unspecified: Secondary | ICD-10-CM | POA: Diagnosis not present

## 2020-09-19 DIAGNOSIS — Z66 Do not resuscitate: Secondary | ICD-10-CM | POA: Diagnosis present

## 2020-09-19 DIAGNOSIS — Z833 Family history of diabetes mellitus: Secondary | ICD-10-CM | POA: Diagnosis not present

## 2020-09-19 DIAGNOSIS — Z434 Encounter for attention to other artificial openings of digestive tract: Secondary | ICD-10-CM | POA: Diagnosis not present

## 2020-09-19 DIAGNOSIS — D6489 Other specified anemias: Secondary | ICD-10-CM | POA: Diagnosis present

## 2020-09-19 DIAGNOSIS — C169 Malignant neoplasm of stomach, unspecified: Secondary | ICD-10-CM | POA: Diagnosis present

## 2020-09-19 DIAGNOSIS — E119 Type 2 diabetes mellitus without complications: Secondary | ICD-10-CM | POA: Diagnosis not present

## 2020-09-19 DIAGNOSIS — R8290 Unspecified abnormal findings in urine: Secondary | ICD-10-CM | POA: Diagnosis not present

## 2020-09-19 DIAGNOSIS — R0689 Other abnormalities of breathing: Secondary | ICD-10-CM | POA: Diagnosis not present

## 2020-09-19 DIAGNOSIS — D6959 Other secondary thrombocytopenia: Secondary | ICD-10-CM | POA: Diagnosis present

## 2020-09-19 DIAGNOSIS — Z87891 Personal history of nicotine dependence: Secondary | ICD-10-CM | POA: Diagnosis not present

## 2020-09-19 DIAGNOSIS — Z87440 Personal history of urinary (tract) infections: Secondary | ICD-10-CM | POA: Diagnosis not present

## 2020-09-19 DIAGNOSIS — Z79899 Other long term (current) drug therapy: Secondary | ICD-10-CM | POA: Diagnosis not present

## 2020-09-19 DIAGNOSIS — Z800 Family history of malignant neoplasm of digestive organs: Secondary | ICD-10-CM | POA: Diagnosis not present

## 2020-09-19 DIAGNOSIS — D649 Anemia, unspecified: Secondary | ICD-10-CM | POA: Diagnosis not present

## 2020-09-19 DIAGNOSIS — T83098A Other mechanical complication of other indwelling urethral catheter, initial encounter: Secondary | ICD-10-CM | POA: Insufficient documentation

## 2020-09-19 DIAGNOSIS — C786 Secondary malignant neoplasm of retroperitoneum and peritoneum: Secondary | ICD-10-CM | POA: Diagnosis present

## 2020-09-19 DIAGNOSIS — N179 Acute kidney failure, unspecified: Secondary | ICD-10-CM | POA: Diagnosis not present

## 2020-09-19 DIAGNOSIS — R6520 Severe sepsis without septic shock: Secondary | ICD-10-CM | POA: Diagnosis not present

## 2020-09-19 DIAGNOSIS — A4159 Other Gram-negative sepsis: Secondary | ICD-10-CM | POA: Diagnosis present

## 2020-09-19 DIAGNOSIS — N138 Other obstructive and reflux uropathy: Secondary | ICD-10-CM | POA: Diagnosis present

## 2020-09-19 DIAGNOSIS — E8809 Other disorders of plasma-protein metabolism, not elsewhere classified: Secondary | ICD-10-CM | POA: Diagnosis not present

## 2020-09-19 DIAGNOSIS — Z85028 Personal history of other malignant neoplasm of stomach: Secondary | ICD-10-CM | POA: Insufficient documentation

## 2020-09-19 DIAGNOSIS — R531 Weakness: Secondary | ICD-10-CM | POA: Diagnosis not present

## 2020-09-19 DIAGNOSIS — A419 Sepsis, unspecified organism: Secondary | ICD-10-CM | POA: Diagnosis not present

## 2020-09-19 DIAGNOSIS — I959 Hypotension, unspecified: Secondary | ICD-10-CM | POA: Diagnosis not present

## 2020-09-19 DIAGNOSIS — K831 Obstruction of bile duct: Secondary | ICD-10-CM | POA: Diagnosis present

## 2020-09-19 DIAGNOSIS — Z8249 Family history of ischemic heart disease and other diseases of the circulatory system: Secondary | ICD-10-CM | POA: Diagnosis not present

## 2020-09-19 DIAGNOSIS — N200 Calculus of kidney: Secondary | ICD-10-CM | POA: Diagnosis not present

## 2020-09-19 DIAGNOSIS — B961 Klebsiella pneumoniae [K. pneumoniae] as the cause of diseases classified elsewhere: Secondary | ICD-10-CM | POA: Diagnosis not present

## 2020-09-19 DIAGNOSIS — R7881 Bacteremia: Secondary | ICD-10-CM | POA: Diagnosis not present

## 2020-09-19 DIAGNOSIS — N183 Chronic kidney disease, stage 3 unspecified: Secondary | ICD-10-CM | POA: Diagnosis not present

## 2020-09-19 DIAGNOSIS — E876 Hypokalemia: Secondary | ICD-10-CM | POA: Diagnosis not present

## 2020-09-19 DIAGNOSIS — C162 Malignant neoplasm of body of stomach: Secondary | ICD-10-CM | POA: Diagnosis not present

## 2020-09-19 DIAGNOSIS — G928 Other toxic encephalopathy: Secondary | ICD-10-CM | POA: Diagnosis not present

## 2020-09-19 DIAGNOSIS — R000 Tachycardia, unspecified: Secondary | ICD-10-CM | POA: Insufficient documentation

## 2020-09-19 DIAGNOSIS — K219 Gastro-esophageal reflux disease without esophagitis: Secondary | ICD-10-CM | POA: Diagnosis present

## 2020-09-19 DIAGNOSIS — R6521 Severe sepsis with septic shock: Secondary | ICD-10-CM | POA: Diagnosis present

## 2020-09-19 DIAGNOSIS — Z20822 Contact with and (suspected) exposure to covid-19: Secondary | ICD-10-CM | POA: Diagnosis present

## 2020-09-19 DIAGNOSIS — Z9689 Presence of other specified functional implants: Secondary | ICD-10-CM | POA: Diagnosis not present

## 2020-09-19 DIAGNOSIS — C772 Secondary and unspecified malignant neoplasm of intra-abdominal lymph nodes: Secondary | ICD-10-CM | POA: Diagnosis not present

## 2020-09-19 DIAGNOSIS — E871 Hypo-osmolality and hyponatremia: Secondary | ICD-10-CM | POA: Diagnosis present

## 2020-09-19 DIAGNOSIS — Z419 Encounter for procedure for purposes other than remedying health state, unspecified: Secondary | ICD-10-CM | POA: Diagnosis not present

## 2020-09-19 DIAGNOSIS — Q649 Congenital malformation of urinary system, unspecified: Secondary | ICD-10-CM

## 2020-09-19 LAB — URINALYSIS, ROUTINE W REFLEX MICROSCOPIC
Bilirubin Urine: NEGATIVE
Glucose, UA: NEGATIVE mg/dL
Hgb urine dipstick: NEGATIVE
Ketones, ur: NEGATIVE mg/dL
Leukocytes,Ua: NEGATIVE
Nitrite: NEGATIVE
Protein, ur: NEGATIVE mg/dL
Specific Gravity, Urine: 1.012 (ref 1.005–1.030)
pH: 6 (ref 5.0–8.0)

## 2020-09-19 MED ORDER — ACETAMINOPHEN 500 MG PO TABS
1000.0000 mg | ORAL_TABLET | Freq: Once | ORAL | Status: AC
  Administered 2020-09-19: 1000 mg via ORAL
  Filled 2020-09-19: qty 2

## 2020-09-19 MED ORDER — CEPHALEXIN 500 MG PO CAPS: 500.0000 mg | ORAL_CAPSULE | Freq: Four times a day (QID) | ORAL | 0 refills | Status: DC

## 2020-09-19 NOTE — ED Provider Notes (Addendum)
[facility] DEPT Provider Note   CSN: [id number] Arrival date & time: 09/19/20  1302     History Chief Complaint  Patient presents with   Catheter bag replacement    Jonathan Lewis is a 80 y.o. male.  HPI  Patient presents with catheter bag issue.  States that he usually flushes the bag with saline and disconnects the the bag twice a day to empty it.  Today, he has been noticing that it has been leaking throughout the day.  He is unable to get a replacement until Monday, so he came to the emergency department to have a replacement bag.  He denies any additional complaints.  He is not having any fevers, chills, signs of infection.  No abdominal pain.  Past Medical History:  Diagnosis Date   Acoustic neuroma ([place]) 06/25/2015   Allergic rhinitis 07/18/2014   Allergy    Anemia    Arthritis    BPH (benign prostatic hyperplasia)    Cancer ([place])    stage III stomach-Dx 03/2019   Carbuncle    recurrent MRSA carbuncles   Cataract    Per pt bilateral cataracts removed.   Diabetes mellitus    Type II. Per pt Dr. Jenny Reichmann took him off his dm med.   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[place]) 09/16/2020   Biliary obstruction 09/09/2020   Right knee pain 04/22/2020   Myelopathy ([place]) 01/08/2020   Iron deficiency anemia 06/04/2019   Abnormal PET scan, mediastinum 05/29/2019   Mediastinal adenopathy 05/22/2019   Goals of care, counseling/discussion 04/29/2019   Gastric cancer pT3pN2 s/p distal gastrectomy with Billroth II gastrojejunostomy 03/19/2019 03/24/2019   H/O exploratory laparotomy 03/19/2019   Malnutrition of moderate degree 03/08/2019   Gastric outlet obstruction 03/07/2019    Weight loss 03/05/2019   Nausea and vomiting 03/05/2019   Decreased appetite 03/05/2019   Epigastric pain 12/05/2018   Hyperglycemia 12/05/2018   DDD (degenerative disc disease), cervical 04/16/2018   Spinal stenosis in cervical region 04/16/2018   Lumbar radiculopathy 01/02/2018   Leg pain, bilateral 11/08/2017   Bursitis of right hip 05/10/2017   Left shoulder pain 05/10/2017   Physical deconditioning 05/10/2017   Wheezing 12/13/2016   Cough 11/03/2016   Neck pain on left side 11/03/2016   Bilateral leg paresthesia 11/10/2015   Sensorineural hearing loss (SNHL) of both ears 07/27/2015   Tinnitus of both ears 07/27/2015   Left acoustic neuroma ([place]) 06/25/2015   Hematochezia 05/13/2015   Abnormal finding on MRI of brain 05/13/2015   Vertigo 05/05/2015   GERD (gastroesophageal reflux disease) 05/05/2015   BPH (benign prostatic hyperplasia) 05/05/2015   Bradycardia    Chest pain 07/18/2014   Allergic rhinitis 07/18/2014   Pain of right thumb 07/18/2014   Male hypogonadism 07/16/2014   Encounter for well adult exam with abnormal findings 07/17/2013   Right sided sciatica 07/17/2013   Constipation 01/21/2012   Leukopenia 06/11/2011   HEARING LOSS 04/10/2009   GROIN PAIN 01/26/2009   Osteoarthritis, hand 10/27/2008   LOW BACK PAIN, CHRONIC 10/27/2008   Diabetes ([place]) 07/17/2008   Hyperlipidemia 06/06/2007   EXTERNAL HEMORRHOIDS 02/15/2007   Benign nodular prostatic hyperplasia  02/15/2007   ERECTILE DYSFUNCTION 10/31/2006   Hypertension complicating diabetes ([place]) 10/31/2006    Past Surgical History:  Procedure Laterality Date   ANTERIOR CERVICAL DECOMPRESSION/DISCECTOMY FUSION 4 LEVELS N/A 01/08/2020   Procedure: ANTERIOR CERVICAL DECOMPRESSION FUSION CERVICAL 3-4, CERVICAL 4-5, CERVIAL 5-6 WITH INSTRUMENTATION AND ALLOGRAFT;  Surgeon: Phylliss Bob, MD;  Location: [facility];  Service: Orthopedics;  Laterality: N/A;   BALLOON DILATION N/A 03/12/2019   Procedure: BALLOON  DILATION;  Surgeon: Rush Landmark Telford Nab., MD;  Location: [facility];  Service: Gastroenterology;  Laterality: N/A;  pyloric   BIOPSY  03/08/2019   Procedure: BIOPSY;  Surgeon: Yetta Flock, MD;  Location: [facility];  Service: Gastroenterology;;   BIOPSY  03/12/2019   Procedure: BIOPSY;  Surgeon: Irving Copas., MD;  Location: [facility];  Service: Gastroenterology;;   CATARACT EXTRACTION     x 2   COLONOSCOPY     ENDOSCOPIC RETROGRADE CHOLANGIOPANCREATOGRAPHY (ERCP) WITH PROPOFOL N/A 09/09/2020   Procedure: ENDOSCOPIC RETROGRADE CHOLANGIOPANCREATOGRAPHY (ERCP) WITH PROPOFOL;  Surgeon: Irving Copas., MD;  Location: [facility];  Service: Gastroenterology;  Laterality: N/A;   ESOPHAGOGASTRODUODENOSCOPY N/A 03/08/2019   Procedure: ESOPHAGOGASTRODUODENOSCOPY (EGD);  Surgeon: Yetta Flock, MD;  Location: [facility];  Service: Gastroenterology;  Laterality: N/A;   ESOPHAGOGASTRODUODENOSCOPY (EGD) WITH PROPOFOL N/A 03/12/2019   Procedure: ESOPHAGOGASTRODUODENOSCOPY (EGD) WITH PROPOFOL;  Surgeon: Rush Landmark Telford Nab., MD;  [facility]: [facility];  Service: Gastroenterology;  Laterality: N/A;   FINE NEEDLE ASPIRATION  03/12/2019   Procedure: FINE NEEDLE ASPIRATION (FNA) LINEAR;  Surgeon: Irving Copas., MD;  Location: [facility];  Service: Gastroenterology;;   FOREIGN BODY REMOVAL  03/08/2019   Procedure: FOREIGN BODY REMOVAL;  Surgeon: Yetta Flock, MD;  Location: [facility];  Service: Gastroenterology;;   IR BILIARY DRAIN PLACEMENT WITH CHOLANGIOGRAM  09/09/2020   IR INT EXT BILIARY DRAIN WITH CHOLANGIOGRAM  09/10/2020   IR PATIENT EVAL TECH 0-60 MINS  04/03/2019   LAPAROTOMY N/A 03/19/2019   Procedure: EXPLORATORY LAPAROTOMY, distal GASTRECTOMY AND PLACEMENT OF G AND J TUBE, gastric jejunostomy;  Surgeon: Greer Pickerel, MD;  Location: [facility];  Service: General;  Laterality: N/A;   POLYPECTOMY     PORTACATH PLACEMENT N/A 03/26/2019    Procedure: INSERTION PORT-A-CATH;  Surgeon: Michael Boston, MD;  Location: [facility];  Service: General;  Laterality: N/A;   Chance INJECTION  09/09/2020   Procedure: SUBMUCOSAL TATTOO INJECTION;  Surgeon: Irving Copas., MD;  Location: [facility];  Service: Gastroenterology;;   UPPER ESOPHAGEAL ENDOSCOPIC ULTRASOUND (EUS) N/A 03/12/2019   Procedure: UPPER ESOPHAGEAL ENDOSCOPIC ULTRASOUND (EUS);  Surgeon: Irving Copas., MD;  Location: [facility];  Service: Gastroenterology;  Laterality: N/A;   UPPER GASTROINTESTINAL ENDOSCOPY     VIDEO BRONCHOSCOPY WITH ENDOBRONCHIAL ULTRASOUND N/A 05/22/2019   Procedure: VIDEO BRONCHOSCOPY WITH ENDOBRONCHIAL ULTRASOUND;  Surgeon: Garner Nash, DO;  Location: [facility];  Service: Thoracic;  Laterality: N/A;       Family History  Problem Relation Age of Onset   Cancer Mother    Hypertension Mother    Diabetes Sister    Cancer Brother        colon   Hypertension Brother    Hyperlipidemia Brother    Stroke Brother    Hypertension Sister    Colon cancer Neg Hx    Heart attack Neg Hx    Sudden death Neg Hx    Esophageal cancer Neg Hx    Pancreatic cancer Neg Hx  Stomach cancer Neg Hx    Rectal cancer Neg Hx     Social History   Tobacco Use   Smoking status: Former    Packs/day: 1.00    Years: 24.00    Pack years: 24.00    Types: Cigarettes    Quit date: 10/03/1978    Years since quitting: 41.9   Smokeless tobacco: Never  Vaping Use   Vaping Use: Never used  Substance Use Topics   Alcohol use: Not Currently   Drug use: No    Home Medications Prior to Admission medications   Medication Sig Start Date End Date Taking? Authorizing Provider  atorvastatin (LIPITOR) 20 MG tablet Take 1 tablet (20 mg total) by mouth daily. Patient taking differently: Take 20 mg by mouth in the morning. 04/26/20 04/26/21  Biagio Borg, MD  dorzolamide-timolol (COSOPT) 22.3-6.8 MG/ML ophthalmic solution Place 1  drop into both eyes 2 (two) times daily. 06/15/20   [provider]  famotidine (PEPCID) 20 MG tablet TAKE 1 TABLET BY MOUTH EVERY DAY 09/10/20   Volanda Napoleon, MD  latanoprost (XALATAN) 0.005 % ophthalmic solution Place 1 drop into both eyes at bedtime.  01/09/19   [provider]  lidocaine-prilocaine (EMLA) cream Apply 1 application topically as needed. Place on the port one hour before appointment. 08/24/20   Volanda Napoleon, MD  meclizine (ANTIVERT) 12.5 MG tablet TAKE 1 TABLET BY MOUTH 3 TIMES DAILY AS NEEDED FOR DIZZINESS. Patient taking differently: Take 12.5 mg by mouth in the morning. 08/10/20   Biagio Borg, MD  Multiple Vitamin (MULTIVITAMIN WITH MINERALS) TABS tablet Take 1 tablet by mouth in the morning. Men's One-A-Day Chewable    [provider]  ondansetron (ZOFRAN) 8 MG tablet Take 1 tablet (8 mg total) by mouth every 8 (eight) hours as needed for nausea or vomiting. 09/16/20   Hayden Pedro, PA-C  senna-docusate (SENOKOT S) 8.6-50 MG tablet Take 1 tablet by mouth daily. 09/12/20   Domenic Polite, MD  SYSTANE ULTRA 0.4-0.3 % SOLN Apply 1 drop to eye 3 (three) times daily as needed (dry eyes).  01/09/19   [provider]  traMADol (ULTRAM) 50 MG tablet Take 2 tablets (100 mg total) by mouth at bedtime as needed for moderate pain or severe pain. 04/22/20   Biagio Borg, MD    Allergies    Patient has no known allergies.  Review of Systems   Review of Systems  Constitutional:  Negative for fever.  Gastrointestinal:  Negative for abdominal pain.  Genitourinary:        Catheter issue.   Physical Exam Updated Vital Signs BP (!) 141/92 (BP Location: Left Arm)   Pulse (!) 106   Temp 98.3 F (36.8 C) (Oral)   Resp 20   SpO2 97%   Physical Exam Vitals and nursing note reviewed. Exam conducted with a chaperone present.  Constitutional:      General: He is not in acute distress.    Appearance: Normal appearance.  HENT:     Head:  Normocephalic and atraumatic.  Eyes:     General: No scleral icterus.    Extraocular Movements: Extraocular movements intact.     Pupils: Pupils are equal, round, and reactive to light.  Genitourinary:    Comments: Catheter bag is in place.  Surrounding site does not appear infected.  No blood in the urine. Skin:    Coloration: Skin is not jaundiced.  Neurological:     Mental Status: He  is alert. Mental status is at baseline.     Coordination: Coordination normal.    ED Results / Procedures / Treatments   Labs (all labs ordered are listed, but only abnormal results are displayed) Labs Reviewed - No data to display  EKG None  Radiology No results found.  Procedures Procedures   Medications Ordered in ED Medications - No data to display  ED Course  I have reviewed the triage vital signs and the nursing notes.  Pertinent labs & imaging results that were available during my care of the patient were reviewed by me and considered in my medical decision making (see chart for details).    MDM Rules/Calculators/A&P                          Patient presents for catheter bag replacement.  Vitals are stable, although he is mildly tachycardic to 106.  I suspect this is because vitals were taken initially upon him walking to the room.  I have a low suspicion for anything infectious.  Patient is not having any pain anywhere, no urinary symptoms.  He is not having fevers or any systemic symptoms.  We will replace the bag.  On re-evaluation, patient heart rate improved but he has a fever of 100.6. He reports that he has felt weak off and on the past week. He had a drain placed on 09/09/20 for biliary obstruction. He attributed the weakness to this. He is not complaining of systemic symptoms still and reiterates the weakness comes and goes. States this is the first time he has had a fever. He is not tender to the abdomen on exam.   Given his age and the fever, I am going to empirically treat  for a UTI. I have also ordered tylenol for the patient inpatient for his fever. The patient has an appointment with his PCP on Monday morning at 8:30 am. I have discussed return precautions with the patient and I will document them in discharge instructions. I think he is still appropriate for discharge with ABX, strict return precautions and close follow up.    Discussed HPI, physical exam and plan of care for this patient with attending Dr. Thamas Jaegers. The attending physician agrees with plan of care.   Final Clinical Impression(s) / ED Diagnoses Final diagnoses:  None    Rx / DC Orders ED Discharge Orders     None        Sherrill Raring, PA-C 09/19/20 1643    Sherrill Raring, PA-C 09/19/20 1758    Luna Fuse, MD 09/20/20 [id number]

## 2020-09-19 NOTE — Discharge Instructions (Addendum)
You were seen today in the emergency department for a catheter bag replacement.  While you are in the emergency department, you did spike a fever of 100.6.  We have collected some urine, but we are highly suspicious that you have developed a urinary tract infection.  I want to start empirically treating it with antibiotics.  Please take Keflex 4 times daily for the next 5 days.  When you follow-up with your primary care provider on Monday morning, please let them know about your ED visit today.    Additionally to the Keflex, I would like you to take Tylenol every 6 hours to help with the fever.    If condition change or worsen I want you to return back to the emergency department.  If you develop a fever of greater than 101, you start to feel weak, develop chest pain, shortness of breath, noticed drainage along the catheter site, have bloody urine please return back to the emergency department.

## 2020-09-19 NOTE — ED Triage Notes (Signed)
Patient requests to have leg bag replaced due to part malfunction.

## 2020-09-21 ENCOUNTER — Inpatient Hospital Stay (HOSPITAL_COMMUNITY)
Admission: EM | Admit: 2020-09-21 | Discharge: 2020-09-30 | DRG: 871 | Disposition: A | Discharge status: Home / Self Care | Payer: Medicare PPO | Attending: Internal Medicine | Admitting: Internal Medicine

## 2020-09-21 ENCOUNTER — Other Ambulatory Visit: Payer: Self-pay

## 2020-09-21 ENCOUNTER — Encounter (HOSPITAL_COMMUNITY): Payer: Self-pay | Admitting: Radiology

## 2020-09-21 ENCOUNTER — Inpatient Hospital Stay (HOSPITAL_COMMUNITY): Payer: Medicare PPO

## 2020-09-21 ENCOUNTER — Ambulatory Visit (HOSPITAL_COMMUNITY): Payer: Medicare PPO

## 2020-09-21 ENCOUNTER — Telehealth: Payer: Self-pay | Admitting: *Deleted

## 2020-09-21 ENCOUNTER — Emergency Department (HOSPITAL_COMMUNITY): Payer: Medicare PPO

## 2020-09-21 ENCOUNTER — Encounter: Payer: Self-pay | Admitting: Hematology & Oncology

## 2020-09-21 ENCOUNTER — Ambulatory Visit (HOSPITAL_COMMUNITY): Admission: RE | Admit: 2020-09-21 | Payer: Medicare PPO | Source: Ambulatory Visit

## 2020-09-21 DIAGNOSIS — Z85028 Personal history of other malignant neoplasm of stomach: Secondary | ICD-10-CM

## 2020-09-21 DIAGNOSIS — D6959 Other secondary thrombocytopenia: Secondary | ICD-10-CM | POA: Diagnosis present

## 2020-09-21 DIAGNOSIS — C786 Secondary malignant neoplasm of retroperitoneum and peritoneum: Secondary | ICD-10-CM | POA: Diagnosis present

## 2020-09-21 DIAGNOSIS — A419 Sepsis, unspecified organism: Secondary | ICD-10-CM

## 2020-09-21 DIAGNOSIS — K819 Cholecystitis, unspecified: Secondary | ICD-10-CM | POA: Diagnosis present

## 2020-09-21 DIAGNOSIS — A4150 Gram-negative sepsis, unspecified: Secondary | ICD-10-CM | POA: Diagnosis not present

## 2020-09-21 DIAGNOSIS — R6520 Severe sepsis without septic shock: Secondary | ICD-10-CM | POA: Diagnosis not present

## 2020-09-21 DIAGNOSIS — C772 Secondary and unspecified malignant neoplasm of intra-abdominal lymph nodes: Secondary | ICD-10-CM | POA: Diagnosis not present

## 2020-09-21 DIAGNOSIS — K219 Gastro-esophageal reflux disease without esophagitis: Secondary | ICD-10-CM | POA: Diagnosis present

## 2020-09-21 DIAGNOSIS — Z87891 Personal history of nicotine dependence: Secondary | ICD-10-CM | POA: Diagnosis not present

## 2020-09-21 DIAGNOSIS — Z8249 Family history of ischemic heart disease and other diseases of the circulatory system: Secondary | ICD-10-CM | POA: Diagnosis not present

## 2020-09-21 DIAGNOSIS — N183 Chronic kidney disease, stage 3 unspecified: Secondary | ICD-10-CM | POA: Diagnosis not present

## 2020-09-21 DIAGNOSIS — N401 Enlarged prostate with lower urinary tract symptoms: Secondary | ICD-10-CM | POA: Diagnosis present

## 2020-09-21 DIAGNOSIS — E871 Hypo-osmolality and hyponatremia: Secondary | ICD-10-CM | POA: Diagnosis not present

## 2020-09-21 DIAGNOSIS — A4159 Other Gram-negative sepsis: Secondary | ICD-10-CM | POA: Diagnosis present

## 2020-09-21 DIAGNOSIS — K831 Obstruction of bile duct: Secondary | ICD-10-CM | POA: Diagnosis present

## 2020-09-21 DIAGNOSIS — Z833 Family history of diabetes mellitus: Secondary | ICD-10-CM

## 2020-09-21 DIAGNOSIS — C169 Malignant neoplasm of stomach, unspecified: Secondary | ICD-10-CM | POA: Diagnosis not present

## 2020-09-21 DIAGNOSIS — R6521 Severe sepsis with septic shock: Secondary | ICD-10-CM | POA: Diagnosis not present

## 2020-09-21 DIAGNOSIS — N400 Enlarged prostate without lower urinary tract symptoms: Secondary | ICD-10-CM | POA: Diagnosis present

## 2020-09-21 DIAGNOSIS — Z9221 Personal history of antineoplastic chemotherapy: Secondary | ICD-10-CM

## 2020-09-21 DIAGNOSIS — D6489 Other specified anemias: Secondary | ICD-10-CM | POA: Diagnosis present

## 2020-09-21 DIAGNOSIS — N179 Acute kidney failure, unspecified: Secondary | ICD-10-CM

## 2020-09-21 DIAGNOSIS — N138 Other obstructive and reflux uropathy: Secondary | ICD-10-CM | POA: Diagnosis present

## 2020-09-21 DIAGNOSIS — R599 Enlarged lymph nodes, unspecified: Secondary | ICD-10-CM | POA: Diagnosis not present

## 2020-09-21 DIAGNOSIS — R109 Unspecified abdominal pain: Secondary | ICD-10-CM

## 2020-09-21 DIAGNOSIS — E8809 Other disorders of plasma-protein metabolism, not elsewhere classified: Secondary | ICD-10-CM | POA: Diagnosis not present

## 2020-09-21 DIAGNOSIS — C787 Secondary malignant neoplasm of liver and intrahepatic bile duct: Secondary | ICD-10-CM | POA: Diagnosis present

## 2020-09-21 DIAGNOSIS — Z20822 Contact with and (suspected) exposure to covid-19: Secondary | ICD-10-CM | POA: Diagnosis present

## 2020-09-21 DIAGNOSIS — G928 Other toxic encephalopathy: Secondary | ICD-10-CM

## 2020-09-21 DIAGNOSIS — Z800 Family history of malignant neoplasm of digestive organs: Secondary | ICD-10-CM

## 2020-09-21 DIAGNOSIS — Z66 Do not resuscitate: Secondary | ICD-10-CM | POA: Diagnosis present

## 2020-09-21 DIAGNOSIS — R509 Fever, unspecified: Secondary | ICD-10-CM

## 2020-09-21 DIAGNOSIS — E876 Hypokalemia: Secondary | ICD-10-CM | POA: Diagnosis not present

## 2020-09-21 DIAGNOSIS — I10 Essential (primary) hypertension: Secondary | ICD-10-CM | POA: Diagnosis present

## 2020-09-21 DIAGNOSIS — E119 Type 2 diabetes mellitus without complications: Secondary | ICD-10-CM

## 2020-09-21 DIAGNOSIS — B961 Klebsiella pneumoniae [K. pneumoniae] as the cause of diseases classified elsewhere: Secondary | ICD-10-CM | POA: Diagnosis not present

## 2020-09-21 DIAGNOSIS — D649 Anemia, unspecified: Secondary | ICD-10-CM | POA: Diagnosis not present

## 2020-09-21 DIAGNOSIS — E1140 Type 2 diabetes mellitus with diabetic neuropathy, unspecified: Secondary | ICD-10-CM | POA: Diagnosis present

## 2020-09-21 DIAGNOSIS — N139 Obstructive and reflux uropathy, unspecified: Secondary | ICD-10-CM | POA: Diagnosis not present

## 2020-09-21 DIAGNOSIS — R6510 Systemic inflammatory response syndrome (SIRS) of non-infectious origin without acute organ dysfunction: Secondary | ICD-10-CM | POA: Diagnosis not present

## 2020-09-21 DIAGNOSIS — D689 Coagulation defect, unspecified: Secondary | ICD-10-CM | POA: Diagnosis present

## 2020-09-21 DIAGNOSIS — Z79899 Other long term (current) drug therapy: Secondary | ICD-10-CM

## 2020-09-21 DIAGNOSIS — R7989 Other specified abnormal findings of blood chemistry: Secondary | ICD-10-CM | POA: Diagnosis not present

## 2020-09-21 DIAGNOSIS — E785 Hyperlipidemia, unspecified: Secondary | ICD-10-CM | POA: Diagnosis present

## 2020-09-21 DIAGNOSIS — R7881 Bacteremia: Secondary | ICD-10-CM | POA: Diagnosis not present

## 2020-09-21 LAB — LACTIC ACID, PLASMA
Lactic Acid, Venous: 2.6 mmol/L (ref 0.5–1.9)
Lactic Acid, Venous: 3.6 mmol/L (ref 0.5–1.9)
Lactic Acid, Venous: 1.2 mmol/L (ref 0.5–1.9)

## 2020-09-21 LAB — CBC WITH DIFFERENTIAL/PLATELET
Abs Immature Granulocytes: 0.87 10*3/uL — ABNORMAL HIGH (ref 0.00–0.07)
Basophils Absolute: 0 10*3/uL (ref 0.0–0.1)
Basophils Relative: 0 %
Eosinophils Absolute: 0 10*3/uL (ref 0.0–0.5)
Eosinophils Relative: 0 %
HCT: 28.4 % — ABNORMAL LOW (ref 39.0–52.0)
Hemoglobin: 9.7 g/dL — ABNORMAL LOW (ref 13.0–17.0)
Immature Granulocytes: 7 %
Lymphocytes Relative: 6 %
Lymphs Abs: 0.8 10*3/uL (ref 0.7–4.0)
MCH: 29 pg (ref 26.0–34.0)
MCHC: 34.2 g/dL (ref 30.0–36.0)
MCV: 85 fL (ref 80.0–100.0)
Monocytes Absolute: 0.8 10*3/uL (ref 0.1–1.0)
Monocytes Relative: 7 %
Neutro Abs: 10.2 10*3/uL — ABNORMAL HIGH (ref 1.7–7.7)
Neutrophils Relative %: 80 %
Platelets: 155 10*3/uL (ref 150–400)
RBC: 3.34 MIL/uL — ABNORMAL LOW (ref 4.22–5.81)
RDW: 12.8 % (ref 11.5–15.5)
WBC: 12.7 10*3/uL — ABNORMAL HIGH (ref 4.0–10.5)
nRBC: 0 % (ref 0.0–0.2)

## 2020-09-21 LAB — TYPE AND SCREEN
ABO/RH(D): O POS
Antibody Screen: NEGATIVE

## 2020-09-21 LAB — URINE CULTURE: Culture: NO GROWTH

## 2020-09-21 LAB — COMPREHENSIVE METABOLIC PANEL
ALT: 95 U/L — ABNORMAL HIGH (ref 0–44)
AST: 147 U/L — ABNORMAL HIGH (ref 15–41)
Albumin: 2.6 g/dL — ABNORMAL LOW (ref 3.5–5.0)
Alkaline Phosphatase: 189 U/L — ABNORMAL HIGH (ref 38–126)
Anion gap: 10 (ref 5–15)
BUN: 22 mg/dL (ref 8–23)
CO2: 19 mmol/L — ABNORMAL LOW (ref 22–32)
Calcium: 7.8 mg/dL — ABNORMAL LOW (ref 8.9–10.3)
Chloride: 94 mmol/L — ABNORMAL LOW (ref 98–111)
Creatinine, Ser: 0.96 mg/dL (ref 0.61–1.24)
GFR, Estimated: >60 mL/min (ref >60)
Glucose, Bld: 152 mg/dL — ABNORMAL HIGH (ref 70–99)
Potassium: 3.5 mmol/L (ref 3.5–5.1)
Sodium: 123 mmol/L — ABNORMAL LOW (ref 135–145)
Total Bilirubin: 3.6 mg/dL — ABNORMAL HIGH (ref 0.3–1.2)
Total Protein: 6.2 g/dL — ABNORMAL LOW (ref 6.5–8.1)

## 2020-09-21 LAB — URINALYSIS, ROUTINE W REFLEX MICROSCOPIC
Bacteria, UA: NONE SEEN
Bilirubin Urine: NEGATIVE
Glucose, UA: NEGATIVE mg/dL
Ketones, ur: NEGATIVE mg/dL
Leukocytes,Ua: NEGATIVE
Nitrite: NEGATIVE
Protein, ur: NEGATIVE mg/dL
Specific Gravity, Urine: 1.02 (ref 1.005–1.030)
pH: 5 (ref 5.0–8.0)

## 2020-09-21 LAB — BASIC METABOLIC PANEL
Anion gap: 5 (ref 5–15)
BUN: 18 mg/dL (ref 8–23)
CO2: 20 mmol/L — ABNORMAL LOW (ref 22–32)
Calcium: 7.3 mg/dL — ABNORMAL LOW (ref 8.9–10.3)
Chloride: 105 mmol/L (ref 98–111)
Creatinine, Ser: 0.96 mg/dL (ref 0.61–1.24)
GFR, Estimated: >60 mL/min (ref >60)
Glucose, Bld: 130 mg/dL — ABNORMAL HIGH (ref 70–99)
Potassium: 3.4 mmol/L — ABNORMAL LOW (ref 3.5–5.1)
Sodium: 130 mmol/L — ABNORMAL LOW (ref 135–145)

## 2020-09-21 LAB — MRSA PCR SCREENING: MRSA by PCR: NEGATIVE

## 2020-09-21 LAB — PROTIME-INR
INR: 1.6 — ABNORMAL HIGH (ref 0.8–1.2)
Prothrombin Time: 19.1 seconds — ABNORMAL HIGH (ref 11.4–15.2)

## 2020-09-21 LAB — RESP PANEL BY RT-PCR (FLU A&B, COVID) ARPGX2
Influenza A by PCR: NEGATIVE
Influenza B by PCR: NEGATIVE
SARS Coronavirus 2 by RT PCR: NEGATIVE

## 2020-09-21 LAB — APTT: aPTT: 35 seconds (ref 24–36)

## 2020-09-21 MED ORDER — SODIUM CHLORIDE 0.9 % IV SOLN
2.0000 g | Freq: Once | INTRAVENOUS | Status: AC
  Administered 2020-09-21: 2 g via INTRAVENOUS
  Filled 2020-09-21: qty 2

## 2020-09-21 MED ORDER — METRONIDAZOLE 500 MG/100ML IV SOLN
500.0000 mg | Freq: Three times a day (TID) | INTRAVENOUS | Status: DC
  Administered 2020-09-21 – 2020-09-22 (×2): 500 mg via INTRAVENOUS
  Filled 2020-09-21 (×2): qty 100

## 2020-09-21 MED ORDER — SODIUM CHLORIDE 0.9 % IV BOLUS
1000.0000 mL | Freq: Once | INTRAVENOUS | Status: AC
Start: 2020-09-21 — End: 2020-09-21
  Administered 2020-09-21: 1000 mL via INTRAVENOUS

## 2020-09-21 MED ORDER — DORZOLAMIDE HCL-TIMOLOL MAL 2-0.5 % OP SOLN
1.0000 [drp] | Freq: Two times a day (BID) | OPHTHALMIC | Status: DC
  Administered 2020-09-21 – 2020-09-30 (×17): 1 [drp] via OPHTHALMIC
  Filled 2020-09-21: qty 10

## 2020-09-21 MED ORDER — VANCOMYCIN HCL 1500 MG/300ML IV SOLN
1500.0000 mg | Freq: Once | INTRAVENOUS | Status: AC
  Administered 2020-09-21: 1500 mg via INTRAVENOUS
  Filled 2020-09-21: qty 300

## 2020-09-21 MED ORDER — VANCOMYCIN HCL 1500 MG/300ML IV SOLN: 1500.0000 mg | INTRAVENOUS | Status: DC

## 2020-09-21 MED ORDER — SODIUM CHLORIDE 0.9 % IV SOLN
2.0000 g | Freq: Three times a day (TID) | INTRAVENOUS | Status: DC
  Administered 2020-09-21 – 2020-09-30 (×27): 2 g via INTRAVENOUS
  Filled 2020-09-21 (×27): qty 2

## 2020-09-21 MED ORDER — LACTATED RINGERS IV SOLN: INTRAVENOUS | Status: DC

## 2020-09-21 MED ORDER — METRONIDAZOLE 500 MG/100ML IV SOLN
500.0000 mg | Freq: Once | INTRAVENOUS | Status: AC
  Administered 2020-09-21: 500 mg via INTRAVENOUS
  Filled 2020-09-21: qty 100

## 2020-09-21 MED ORDER — ORAL CARE MOUTH RINSE
15.0000 mL | Freq: Two times a day (BID) | OROMUCOSAL | Status: DC
  Administered 2020-09-21 – 2020-09-27 (×9): 15 mL via OROMUCOSAL

## 2020-09-21 MED ORDER — CHLORHEXIDINE GLUCONATE CLOTH 2 % EX PADS
6.0000 | MEDICATED_PAD | Freq: Every day | CUTANEOUS | Status: DC
  Administered 2020-09-21 – 2020-09-30 (×10): 6 via TOPICAL

## 2020-09-21 MED ORDER — LACTATED RINGERS IV BOLUS (SEPSIS)
1000.0000 mL | Freq: Once | INTRAVENOUS | Status: AC
  Administered 2020-09-21: 1000 mL via INTRAVENOUS

## 2020-09-21 MED ORDER — CHLORHEXIDINE GLUCONATE 0.12 % MT SOLN
15.0000 mL | Freq: Two times a day (BID) | OROMUCOSAL | Status: DC
  Administered 2020-09-21 – 2020-09-30 (×16): 15 mL via OROMUCOSAL
  Filled 2020-09-21 (×13): qty 15

## 2020-09-21 MED ORDER — LATANOPROST 0.005 % OP SOLN
1.0000 [drp] | Freq: Every day | OPHTHALMIC | Status: DC
  Administered 2020-09-21 – 2020-09-29 (×9): 1 [drp] via OPHTHALMIC
  Filled 2020-09-21: qty 2.5

## 2020-09-21 MED ORDER — LACTATED RINGERS IV BOLUS
1000.0000 mL | Freq: Once | INTRAVENOUS | Status: AC
  Administered 2020-09-21: 1000 mL via INTRAVENOUS

## 2020-09-21 MED ORDER — NOREPINEPHRINE 4 MG/250ML-% IV SOLN
0.0000 ug/min | INTRAVENOUS | Status: DC
  Administered 2020-09-21: 2 ug/min via INTRAVENOUS
  Filled 2020-09-21: qty 250

## 2020-09-21 MED ORDER — SODIUM CHLORIDE 0.9 % IV BOLUS
1000.0000 mL | Freq: Once | INTRAVENOUS | Status: AC
  Administered 2020-09-21: 1000 mL via INTRAVENOUS

## 2020-09-21 MED ORDER — LACTATED RINGERS IV BOLUS (SEPSIS)
500.0000 mL | Freq: Once | INTRAVENOUS | Status: AC
  Administered 2020-09-21: 500 mL via INTRAVENOUS

## 2020-09-21 MED ORDER — SODIUM CHLORIDE 0.9 % IV SOLN: INTRAVENOUS | Status: DC

## 2020-09-21 MED ORDER — IBUPROFEN 800 MG PO TABS
800.0000 mg | ORAL_TABLET | Freq: Once | ORAL | Status: AC
  Administered 2020-09-21: 800 mg via ORAL
  Filled 2020-09-21: qty 1

## 2020-09-21 MED ORDER — IOHEXOL 300 MG/ML  SOLN
100.0000 mL | Freq: Once | INTRAMUSCULAR | Status: AC | PRN
  Administered 2020-09-21: 100 mL via INTRAVENOUS

## 2020-09-21 MED ORDER — SODIUM CHLORIDE (PF) 0.9 % IJ SOLN
INTRAMUSCULAR | Status: AC
  Filled 2020-09-21: qty 50

## 2020-09-21 NOTE — ED Provider Notes (Signed)
[facility] DEPT Provider Note   CSN: [id number] Arrival date & time: 09/21/20  1122     History No chief complaint on file.   Jonathan Lewis is a 80 y.o. male.  Pt presents to the ED today with weakness.  Pt was in the ED on 6/18 for a urinary catheter bag replacement.  While he was there, he developed a low grade fever and was empirically treated for a UTI with Keflex.  He has been taking it, but is getting worse.  Culture negative.  Pt had an appt with Dr. Marin Olp today, but was too weak to get out of bed, so he called EMS.  Pt did have a fever of 100.2 when EMS arrived, so he was given 1000 mg of tylenol.  His temp 102 orally now.  Pt's bp was in the 80s.  EMS unable to get IV access, so no fluid given pta.  Pt does not have any pain, but feels very weak.  Pt has been fully vaccinated against Covid.  Pt does have a hx of stage IIIA adenocarcinoma of the stomach with mets and biliary obstruction.  Pt has a biliary drain due to failed ERCP due to anatomy.      Past Medical History:  Diagnosis Date   Acoustic neuroma ([place]) 06/25/2015   Allergic rhinitis 07/18/2014   Allergy    Anemia    Arthritis    BPH (benign prostatic hyperplasia)    Cancer ([place])    stage III stomach-Dx 03/2019   Carbuncle    recurrent MRSA carbuncles   Cataract    Per pt bilateral cataracts removed.   Diabetes mellitus    Type II. Per pt Dr. Jenny Reichmann took him off his dm med.   Disk prolapse    Glaucoma    Hyperlipidemia    Hypertension    Male hypogonadism 07/16/2014   Neuropathy    Pneumonia    Sinus bradycardia    chronic, asymptomatic   Sinusitis 07/29/2012    Patient Active Problem List   Diagnosis Date Noted   SIRS (systemic inflammatory response syndrome) ([place]) 09/21/2020   SIRS due to infectious process with acute organ dysfunction ([place]) 09/21/2020   Malignant neoplasm metastatic to intra-abdominal lymph node ([place]) 09/16/2020   Biliary obstruction 09/09/2020    Right knee pain 04/22/2020   Myelopathy ([place]) 01/08/2020   Iron deficiency anemia 06/04/2019   Abnormal PET scan, mediastinum 05/29/2019   Mediastinal adenopathy 05/22/2019   Goals of care, counseling/discussion 04/29/2019   Gastric cancer pT3pN2 s/p distal gastrectomy with Billroth II gastrojejunostomy 03/19/2019 03/24/2019   H/O exploratory laparotomy 03/19/2019   Malnutrition of moderate degree 03/08/2019   Gastric outlet obstruction 03/07/2019   Weight loss 03/05/2019   Nausea and vomiting 03/05/2019   Decreased appetite 03/05/2019   Epigastric pain 12/05/2018   Hyperglycemia 12/05/2018   DDD (degenerative disc disease), cervical 04/16/2018   Spinal stenosis in cervical region 04/16/2018   Lumbar radiculopathy 01/02/2018   Leg pain, bilateral 11/08/2017   Bursitis of right hip 05/10/2017   Left shoulder pain 05/10/2017   Physical deconditioning 05/10/2017   Wheezing 12/13/2016   Cough 11/03/2016   Neck pain on left side 11/03/2016   Bilateral leg paresthesia 11/10/2015   Sensorineural hearing loss (SNHL) of both ears 07/27/2015   Tinnitus of both ears 07/27/2015   Left acoustic neuroma ([place]) 06/25/2015   Hematochezia 05/13/2015   Abnormal finding on MRI of brain 05/13/2015   Vertigo 05/05/2015   GERD (  gastroesophageal reflux disease) 05/05/2015   BPH (benign prostatic hyperplasia) 05/05/2015   Bradycardia    Chest pain 07/18/2014   Allergic rhinitis 07/18/2014   Pain of right thumb 07/18/2014   Male hypogonadism 07/16/2014   Encounter for well adult exam with abnormal findings 07/17/2013   Right sided sciatica 07/17/2013   Constipation 01/21/2012   Leukopenia 06/11/2011   HEARING LOSS 04/10/2009   GROIN PAIN 01/26/2009   Osteoarthritis, hand 10/27/2008   LOW BACK PAIN, CHRONIC 10/27/2008   Diabetes ([place]) 07/17/2008   Hyperlipidemia 06/06/2007   EXTERNAL HEMORRHOIDS 02/15/2007   Benign nodular prostatic hyperplasia 02/15/2007   ERECTILE DYSFUNCTION 10/31/2006    Hypertension complicating diabetes ([place]) 10/31/2006    Past Surgical History:  Procedure Laterality Date   ANTERIOR CERVICAL DECOMPRESSION/DISCECTOMY FUSION 4 LEVELS N/A 01/08/2020   Procedure: ANTERIOR CERVICAL DECOMPRESSION FUSION CERVICAL 3-4, CERVICAL 4-5, CERVIAL 5-6 WITH INSTRUMENTATION AND ALLOGRAFT;  Surgeon: Phylliss Bob, MD;  Location: [facility];  Service: Orthopedics;  Laterality: N/A;   BALLOON DILATION N/A 03/12/2019   Procedure: BALLOON DILATION;  Surgeon: Rush Landmark Telford Nab., MD;  Location: [facility];  Service: Gastroenterology;  Laterality: N/A;  pyloric   BIOPSY  03/08/2019   Procedure: BIOPSY;  Surgeon: Yetta Flock, MD;  Location: [facility];  Service: Gastroenterology;;   BIOPSY  03/12/2019   Procedure: BIOPSY;  Surgeon: Irving Copas., MD;  Location: [facility];  Service: Gastroenterology;;   CATARACT EXTRACTION     x 2   COLONOSCOPY     ENDOSCOPIC RETROGRADE CHOLANGIOPANCREATOGRAPHY (ERCP) WITH PROPOFOL N/A 09/09/2020   Procedure: ENDOSCOPIC RETROGRADE CHOLANGIOPANCREATOGRAPHY (ERCP) WITH PROPOFOL;  Surgeon: Irving Copas., MD;  Location: [facility];  Service: Gastroenterology;  Laterality: N/A;   ESOPHAGOGASTRODUODENOSCOPY N/A 03/08/2019   Procedure: ESOPHAGOGASTRODUODENOSCOPY (EGD);  Surgeon: Yetta Flock, MD;  Location: [facility];  Service: Gastroenterology;  Laterality: N/A;   ESOPHAGOGASTRODUODENOSCOPY (EGD) WITH PROPOFOL N/A 03/12/2019   Procedure: ESOPHAGOGASTRODUODENOSCOPY (EGD) WITH PROPOFOL;  Surgeon: Rush Landmark Telford Nab., MD;  [facility];  Service: Gastroenterology;  Laterality: N/A;   FINE NEEDLE ASPIRATION  03/12/2019   Procedure: FINE NEEDLE ASPIRATION (FNA) LINEAR;  Surgeon: Irving Copas., MD;  [facility];  Service: Gastroenterology;;   FOREIGN BODY REMOVAL  03/08/2019   Procedure: FOREIGN BODY REMOVAL;  Surgeon: Yetta Flock, MD;  [facility];  Service:  Gastroenterology;;   IR BILIARY DRAIN PLACEMENT WITH CHOLANGIOGRAM  09/09/2020   IR INT EXT BILIARY DRAIN WITH CHOLANGIOGRAM  09/10/2020   IR PATIENT EVAL TECH 0-60 MINS  04/03/2019   LAPAROTOMY N/A 03/19/2019   Procedure: EXPLORATORY LAPAROTOMY, distal GASTRECTOMY AND PLACEMENT OF G AND J TUBE, gastric jejunostomy;  Surgeon: Greer Pickerel, MD;  Location: [facility];  Service: General;  Laterality: N/A;   POLYPECTOMY     PORTACATH PLACEMENT N/A 03/26/2019   Procedure: INSERTION PORT-A-CATH;  Surgeon: Michael Boston, MD;  Location: [facility];  Service: General;  Laterality: N/A;   [place] INJECTION  09/09/2020   Procedure: SUBMUCOSAL TATTOO INJECTION;  Surgeon: Irving Copas., MD;  Location: [facility];  Service: Gastroenterology;;   UPPER ESOPHAGEAL ENDOSCOPIC ULTRASOUND (EUS) N/A 03/12/2019   Procedure: UPPER ESOPHAGEAL ENDOSCOPIC ULTRASOUND (EUS);  Surgeon: Irving Copas., MD;  Location: [facility];  Service: Gastroenterology;  Laterality: N/A;   UPPER GASTROINTESTINAL ENDOSCOPY     VIDEO BRONCHOSCOPY WITH ENDOBRONCHIAL ULTRASOUND N/A 05/22/2019   Procedure: VIDEO BRONCHOSCOPY WITH ENDOBRONCHIAL ULTRASOUND;  Surgeon: Garner Nash, DO;  Location:  [facility];  Service: Thoracic;  Laterality: N/A;       Family History  Problem Relation Age of Onset   Cancer Mother    Hypertension Mother    Diabetes Sister    Cancer Brother        colon   Hypertension Brother    Hyperlipidemia Brother    Stroke Brother    Hypertension Sister    Colon cancer Neg Hx    Heart attack Neg Hx    Sudden death Neg Hx    Esophageal cancer Neg Hx    Pancreatic cancer Neg Hx    Stomach cancer Neg Hx    Rectal cancer Neg Hx     Social History   Tobacco Use   Smoking status: Former    Packs/day: 1.00    Years: 24.00    Pack years: 24.00    Types: Cigarettes    Quit date: 10/03/1978    Years since quitting: 41.9   Smokeless tobacco: Never  Vaping Use   Vaping Use:  Never used  Substance Use Topics   Alcohol use: Not Currently   Drug use: No    Home Medications Prior to Admission medications   Medication Sig Start Date End Date Taking? Authorizing Provider  atorvastatin (LIPITOR) 20 MG tablet Take 1 tablet (20 mg total) by mouth daily. Patient taking differently: Take 20 mg by mouth in the morning. 04/26/20 04/26/21  Biagio Borg, MD  cephALEXin (KEFLEX) 500 MG capsule Take 1 capsule (500 mg total) by mouth 4 (four) times daily. 09/19/20   Sherrill Raring, PA-C  dorzolamide-timolol (COSOPT) 22.3-6.8 MG/ML ophthalmic solution Place 1 drop into both eyes 2 (two) times daily. 06/15/20   [provider]  famotidine (PEPCID) 20 MG tablet TAKE 1 TABLET BY MOUTH EVERY DAY 09/10/20   Volanda Napoleon, MD  latanoprost (XALATAN) 0.005 % ophthalmic solution Place 1 drop into both eyes at bedtime.  01/09/19   [provider]  lidocaine-prilocaine (EMLA) cream Apply 1 application topically as needed. Place on the port one hour before appointment. 08/24/20   Volanda Napoleon, MD  meclizine (ANTIVERT) 12.5 MG tablet TAKE 1 TABLET BY MOUTH 3 TIMES DAILY AS NEEDED FOR DIZZINESS. Patient taking differently: Take 12.5 mg by mouth in the morning. 08/10/20   Biagio Borg, MD  Multiple Vitamin (MULTIVITAMIN WITH MINERALS) TABS tablet Take 1 tablet by mouth in the morning. Men's One-A-Day Chewable    [provider]  ondansetron (ZOFRAN) 8 MG tablet Take 1 tablet (8 mg total) by mouth every 8 (eight) hours as needed for nausea or vomiting. 09/16/20   Hayden Pedro, PA-C  senna-docusate (SENOKOT S) 8.6-50 MG tablet Take 1 tablet by mouth daily. 09/12/20   Domenic Polite, MD  SYSTANE ULTRA 0.4-0.3 % SOLN Apply 1 drop to eye 3 (three) times daily as needed (dry eyes).  01/09/19   [provider]  traMADol (ULTRAM) 50 MG tablet Take 2 tablets (100 mg total) by mouth at bedtime as needed for moderate pain or severe pain. 04/22/20   Biagio Borg, MD     Allergies    Patient has no known allergies.  Review of Systems   Review of Systems  Constitutional:  Positive for fever.  Neurological:  Positive for weakness.  All other systems reviewed and are negative.  Physical Exam Updated Vital Signs BP 93/62   Pulse 72   Temp 97.8 F (36.6 C) (Oral)   Resp 16   SpO2  99%   Physical Exam Vitals and nursing note reviewed.  Constitutional:      Appearance: Normal appearance.  HENT:     Head: Normocephalic and atraumatic.     Right Ear: External ear normal.     Left Ear: External ear normal.     Nose: Nose normal.     Mouth/Throat:     Mouth: Mucous membranes are moist.     Pharynx: Oropharynx is clear.  Eyes:     Extraocular Movements: Extraocular movements intact.     Conjunctiva/sclera: Conjunctivae normal.     Pupils: Pupils are equal, round, and reactive to light.  Cardiovascular:     Rate and Rhythm: Normal rate and regular rhythm.     Pulses: Normal pulses.     Heart sounds: Normal heart sounds.  Pulmonary:     Effort: Pulmonary effort is normal.     Breath sounds: Normal breath sounds.  Abdominal:     General: Abdomen is flat. Bowel sounds are normal.     Palpations: Abdomen is soft.     Comments: Biliary stent noted  Musculoskeletal:        General: Normal range of motion.     Cervical back: Normal range of motion and neck supple.  Skin:    General: Skin is warm.     Capillary Refill: Capillary refill takes less than 2 seconds.  Neurological:     General: No focal deficit present.     Mental Status: He is alert and oriented to person, place, and time.  Psychiatric:        Mood and Affect: Mood normal.        Behavior: Behavior normal.        Thought Content: Thought content normal.        Judgment: Judgment normal.    ED Results / Procedures / Treatments   Labs (all labs ordered are listed, but only abnormal results are displayed) Labs Reviewed  COMPREHENSIVE METABOLIC PANEL - Abnormal; Notable for  the following components:      Result Value   Sodium 123 (*)    Chloride 94 (*)    CO2 19 (*)    Glucose, Bld 152 (*)    Calcium 7.8 (*)    Total Protein 6.2 (*)    Albumin 2.6 (*)    AST 147 (*)    ALT 95 (*)    Alkaline Phosphatase 189 (*)    Total Bilirubin 3.6 (*)    All other components within normal limits  LACTIC ACID, PLASMA - Abnormal; Notable for the following components:   Lactic Acid, Venous 2.6 (*)    All other components within normal limits  CBC WITH DIFFERENTIAL/PLATELET - Abnormal; Notable for the following components:   WBC 12.7 (*)    RBC 3.34 (*)    Hemoglobin 9.7 (*)    HCT 28.4 (*)    Neutro Abs 10.2 (*)    Abs Immature Granulocytes 0.87 (*)    All other components within normal limits  PROTIME-INR - Abnormal; Notable for the following components:   Prothrombin Time 19.1 (*)    INR 1.6 (*)    All other components within normal limits  RESP PANEL BY RT-PCR (FLU A&B, COVID) ARPGX2  CULTURE, BLOOD (ROUTINE X 2)  CULTURE, BLOOD (ROUTINE X 2)  URINE CULTURE  BODY FLUID CULTURE W GRAM STAIN  MRSA NEXT GEN BY PCR, NASAL  APTT  LACTIC ACID, PLASMA  URINALYSIS, ROUTINE W REFLEX MICROSCOPIC  EKG EKG Interpretation  Date/Time:  Monday September 21 2020 11:37:50 EDT Ventricular Rate:  98 PR Interval:  176 QRS Duration: 91 QT Interval:  343 QTC Calculation: 438 R Axis:   51 Text Interpretation: Sinus rhythm Abnormal R-wave progression, early transition No significant change since last tracing Confirmed by Isla Pence [phone]) on 09/21/2020 12:12:48 PM  Radiology DG Chest 2 View  Result Date: 09/21/2020 CLINICAL DATA:  Weakness and fever for 2 weeks. EXAM: CHEST - 2 VIEW COMPARISON:  03/26/2019 FINDINGS: The cardiomediastinal silhouette is unremarkable. Mild peribronchial thickening again noted. A RIGHT Port-A-Cath is again noted with tip overlying the SUPERIOR cavoatrial junction. There is no evidence of focal airspace disease, pulmonary edema,  suspicious pulmonary nodule/mass, pleural effusion, or pneumothorax. No acute bony abnormalities are identified. IMPRESSION: No active cardiopulmonary disease. Electronically Signed   By: Margarette Canada M.D.   On: 09/21/2020 12:23   CT CHEST ABDOMEN PELVIS W CONTRAST  Result Date: 09/21/2020 CLINICAL DATA:  Abdominal pain, fever, weakness. Urinary tract infection. Gastrointestinal cancer. EXAM: CT CHEST, ABDOMEN, AND PELVIS WITH CONTRAST TECHNIQUE: Multidetector CT imaging of the chest, abdomen and pelvis was performed following the standard protocol during bolus administration of intravenous contrast. CONTRAST:  131mL OMNIPAQUE IOHEXOL 300 MG/ML  SOLN COMPARISON:  CT abdomen pelvis 09/10/2020 and CT chest 06/12/2020. MR abdomen 09/04/2020 and PET 06/25/2020 FINDINGS: CT CHEST FINDINGS Cardiovascular: Right IJ Port-A-Cath terminates at the SVC RA junction. Heart is at the upper limits of normal in size. No pericardial effusion. Mediastinum/Nodes: Mediastinal lymph nodes measure up to 11 mm in the low right paratracheal station, similar. Calcified mediastinal lymph nodes. No hilar or axillary adenopathy. Esophagus is grossly. Lungs/Pleura: Centrilobular and paraseptal emphysema. Calcified granulomas. Minimal dependent atelectasis bilaterally. No pleural fluid. Adherent debris in the right mainstem bronchus. Musculoskeletal: Degenerative changes in the spine. No worrisome lytic or sclerotic lesions. CT ABDOMEN PELVIS FINDINGS Hepatobiliary: Similar biliary ductal dilatation with a percutaneous biliary drainage catheter in place, terminating in the duodenum. Associated centrally obstructing mass, better seen on 09/04/2020. 9 mm low-attenuation lesion in the periphery of segment 4 (2/65), unchanged and too small to characterize. Other subcapsular metastases seen on 09/03/2020 are not readily appreciated. Gallbladder is dilated. Pancreas: Mass seen in the pancreatic body on 09/04/2020 is poorly appreciated. Spleen:  Negative. Adrenals/Urinary Tract: Adrenal glands are unremarkable. Right renal stone. Kidneys are otherwise unremarkable. Ureters are decompressed. Bladder may be minimally thick-walled. Stomach/Bowel: Distal gastrectomy. Stomach, small bowel and appendix are otherwise unremarkable. Fluid seen in the colon. Vascular/Lymphatic: Vascular structures are unremarkable. No pathologically enlarged lymph nodes. Reproductive: Prostate is visualized. Other: Bilateral omental nodules measure up to 1.4 cm, as on 09/10/2020. Mesenteric nodules measure up to 8 mm in short axis (2/86). No free fluid. Left inguinal hernia contains fat. Musculoskeletal: Degenerative changes in the spine. IMPRESSION: 1. No findings to explain the patient's acute clinical history. 2. Peritoneal/omental metastases, as on 09/10/2020. 3. Percutaneous biliary drain in place with similar gallbladder and biliary ductal dilatation. Associated obstructing mass seen on 09/04/2020 is better seen on that study. 4. Pancreatic body mass, also better seen on 09/04/2020. 5. Right renal stone. Electronically Signed   By: Lorin Picket M.D.   On: 09/21/2020 14:47    Procedures Procedures   Medications Ordered in ED Medications  lactated ringers infusion ( Intravenous New Bag/Given 09/21/20 1149)  sodium chloride (PF) 0.9 % injection (has no administration in time range)  lactated ringers bolus 1,000 mL (0 mLs Intravenous Stopped 09/21/20 1315)  And  lactated ringers bolus 1,000 mL (0 mLs Intravenous Stopped 09/21/20 1433)    And  lactated ringers bolus 500 mL (0 mLs Intravenous Stopped 09/21/20 1433)  metroNIDAZOLE (FLAGYL) IVPB 500 mg (0 mg Intravenous Stopped 09/21/20 1315)  ibuprofen (ADVIL) tablet 800 mg (800 mg Oral Given 09/21/20 1226)  ceFEPIme (MAXIPIME) 2 g in sodium chloride 0.9 % 100 mL IVPB (0 g Intravenous Stopped 09/21/20 1315)  vancomycin (VANCOREADY) IVPB 1500 mg/300 mL (0 mg Intravenous Stopped 09/21/20 1514)  iohexol (OMNIPAQUE) 300  MG/ML solution 100 mL (100 mLs Intravenous Contrast Given 09/21/20 1343)  sodium chloride 0.9 % bolus 1,000 mL (1,000 mLs Intravenous New Bag/Given 09/21/20 1439)    ED Course  I have reviewed the triage vital signs and the nursing notes.  Pertinent labs & imaging results that were available during my care of the patient were reviewed by me and considered in my medical decision making (see chart for details).    MDM Rules/Calculators/A&P                          Code sepsis called upon pt's arrival.  He was given ibuprofen for his fever (he had just received tylenol) and sepsis fluids.  Source of fever unclear, so he was given Maxipime and Flagyl and Vancomycin.  His covid was negative.  UA and CXR nl.  Bilirubin is decreasing.  Pt d/w Dr. Marin Olp (oncology) who recommended culturing biliary fluid and treat with broad spectrum abx.  He also asked that I speak to radiation oncology to let them know pt is here.  Pt was supposed to get his first treatment last week, but did not get it due to insurance.  He is scheduled for his first treatment tomorrow.  I spoke with Dr. Tammi Klippel who will let the center know he is admitted here.  I spoke with Dr. Si Raider (triad) for admission.  CRITICAL CARE Performed by: Isla Pence   Total critical care time:  60 minutes  Critical care time was exclusive of separately billable procedures and treating other patients.  Critical care was necessary to treat or prevent imminent or life-threatening deterioration.  Critical care was time spent personally by me on the following activities: development of treatment plan with patient and/or surrogate as well as nursing, discussions with consultants, evaluation of patient's response to treatment, examination of patient, obtaining history from patient or surrogate, ordering and performing treatments and interventions, ordering and review of laboratory studies, ordering and review of radiographic studies, pulse oximetry  and re-evaluation of patient's condition.    Jonathan Lewis was evaluated in Emergency Department on 09/21/2020 for the symptoms described in the history of present illness. He was evaluated in the context of the global COVID-19 pandemic, which necessitated consideration that the patient might be at risk for infection with the SARS-CoV-2 virus that causes COVID-19. Institutional protocols and algorithms that pertain to the evaluation of patients at risk for COVID-19 are in a state of rapid change based on information released by regulatory bodies including the CDC and federal and state organizations. These policies and algorithms were followed during the patient's care in the ED.   Final Clinical Impression(s) / ED Diagnoses Final diagnoses:  Abdominal pain  Sepsis, due to unspecified organism, unspecified whether acute organ dysfunction present [facility])  Adenocarcinoma of stomach ([place])    Rx / DC Orders ED Discharge Orders     None        Isla Pence,  MD 09/21/20 1546

## 2020-09-21 NOTE — Sepsis Progress Note (Signed)
Sepsis protocol is being followed by [facility]. 

## 2020-09-21 NOTE — Progress Notes (Signed)
Spoke to Dr. Si Raider regarding CCM consult. Dr. Silas Flood to see the patient this evening. Agree with norepinephrine infusion, broad spectrum antibiotics, and additional imaging to determine if GB decompression is feasible. Full consult note to follow.  Julian Hy, DO 09/21/20 7:32 PM [facility]

## 2020-09-21 NOTE — Sepsis Progress Note (Signed)
Requested bedside nurse to get a third LA per protocol.

## 2020-09-21 NOTE — H&P (Addendum)
History and Physical    Jonathan Lewis PIR:[id number] DOB: November 15, 1940 DOA: 09/21/2020  PCP: Biagio Borg, MD  Patient coming from: home   Chief Complaint: fatigue  HPI: Jonathan Lewis is a 80 y.o. 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  Recent hospitalization for biliary obstruction 2/2 metastatic cancer. Unable to place biliary stent so IR percutaneous stent placed.  Patient presented to our ED 2 days ago for urinary cather bag replacement. Found to be febrile, prescribed keflex empirically for uti. Culture since returned negative.  Wife reports that patient has been fatigued for some weeks but for last couple of days much more fatigued, not interactive, not getting out of bed. No report of chest pain or cough. No report of vomiting or diarrhea.  ED Course:   Labs show hyponatremia. Temp 102, code sepsis. Given 3.5 L fluids and vanc/cef/flatyl. CT of abdomen/pelvix unrevealing. Oncology consulted, advises treating for biliary infection  Review of Systems: As per HPI otherwise 10 point review of systems negative.    Past Medical History:  Diagnosis Date   Acoustic neuroma ([place]) 06/25/2015   Allergic rhinitis 07/18/2014   Allergy    Anemia    Arthritis    BPH (benign prostatic hyperplasia)    Cancer ([place])    stage III stomach-Dx 03/2019   Carbuncle    recurrent MRSA carbuncles   Cataract    Per pt bilateral cataracts removed.   Diabetes mellitus    Type II. Per pt Dr. Jenny Reichmann took him off his dm med.   Disk prolapse    Glaucoma    Hyperlipidemia    Hypertension    Male hypogonadism 07/16/2014   Neuropathy    Pneumonia    Sinus bradycardia    chronic, asymptomatic   Sinusitis 07/29/2012    Past Surgical History:  Procedure Laterality Date   ANTERIOR CERVICAL DECOMPRESSION/DISCECTOMY FUSION 4 LEVELS N/A 01/08/2020   Procedure: ANTERIOR CERVICAL DECOMPRESSION FUSION  CERVICAL 3-4, CERVICAL 4-5, CERVIAL 5-6 WITH INSTRUMENTATION AND ALLOGRAFT;  Surgeon: Phylliss Bob, MD;  Location: [facility];  Service: Orthopedics;  Laterality: N/A;   BALLOON DILATION N/A 03/12/2019   Procedure: BALLOON DILATION;  Surgeon: Rush Landmark Telford Nab., MD;  Location: [facility];  Service: Gastroenterology;  Laterality: N/A;  pyloric   BIOPSY  03/08/2019   Procedure: BIOPSY;  Surgeon: Yetta Flock, MD;  Location: [facility];  Service: Gastroenterology;;   BIOPSY  03/12/2019   Procedure: BIOPSY;  Surgeon: Irving Copas., MD;  Location: [facility];  Service: Gastroenterology;;   CATARACT EXTRACTION     x 2   COLONOSCOPY     ENDOSCOPIC RETROGRADE CHOLANGIOPANCREATOGRAPHY (ERCP) WITH PROPOFOL N/A 09/09/2020   Procedure: ENDOSCOPIC RETROGRADE CHOLANGIOPANCREATOGRAPHY (ERCP) WITH PROPOFOL;  Surgeon: Irving Copas., MD;  Location: [facility];  Service: Gastroenterology;  Laterality: N/A;   ESOPHAGOGASTRODUODENOSCOPY N/A 03/08/2019   Procedure: ESOPHAGOGASTRODUODENOSCOPY (EGD);  Surgeon: Yetta Flock, MD;  Location: [facility];  Service: Gastroenterology;  Laterality: N/A;   ESOPHAGOGASTRODUODENOSCOPY (EGD) WITH PROPOFOL N/A 03/12/2019   Procedure: ESOPHAGOGASTRODUODENOSCOPY (EGD) WITH PROPOFOL;  Surgeon: Rush Landmark Telford Nab., MD;  Location: [facility];  Service: Gastroenterology;  Laterality: N/A;   FINE NEEDLE ASPIRATION  03/12/2019   Procedure: FINE NEEDLE ASPIRATION (FNA) LINEAR;  Surgeon: Irving Copas., MD;  Location: [facility];  Service: Gastroenterology;;   FOREIGN BODY REMOVAL  03/08/2019   Procedure: FOREIGN BODY REMOVAL;  Surgeon: Yetta Flock,  MD;  Location: [facility];  Service: Gastroenterology;;   IR BILIARY DRAIN PLACEMENT WITH CHOLANGIOGRAM  09/09/2020   IR INT EXT BILIARY DRAIN WITH CHOLANGIOGRAM  09/10/2020   IR PATIENT EVAL TECH 0-60 MINS  04/03/2019   LAPAROTOMY N/A 03/19/2019   Procedure: EXPLORATORY  LAPAROTOMY, distal GASTRECTOMY AND PLACEMENT OF G AND J TUBE, gastric jejunostomy;  Surgeon: Greer Pickerel, MD;  Location: [facility];  Service: General;  Laterality: N/A;   POLYPECTOMY     PORTACATH PLACEMENT N/A 03/26/2019   Procedure: INSERTION PORT-A-CATH;  Surgeon: Michael Boston, MD;  Location: [facility];  Service: General;  Laterality: N/A;   [place] INJECTION  09/09/2020   Procedure: SUBMUCOSAL TATTOO INJECTION;  Surgeon: Irving Copas., MD;  Location: [facility];  Service: Gastroenterology;;   UPPER ESOPHAGEAL ENDOSCOPIC ULTRASOUND (EUS) N/A 03/12/2019   Procedure: UPPER ESOPHAGEAL ENDOSCOPIC ULTRASOUND (EUS);  Surgeon: Irving Copas., MD;  Location: [facility];  Service: Gastroenterology;  Laterality: N/A;   UPPER GASTROINTESTINAL ENDOSCOPY     VIDEO BRONCHOSCOPY WITH ENDOBRONCHIAL ULTRASOUND N/A 05/22/2019   Procedure: VIDEO BRONCHOSCOPY WITH ENDOBRONCHIAL ULTRASOUND;  Surgeon: Garner Nash, DO;  Location: [facility];  Service: Thoracic;  Laterality: N/A;     reports that he quit smoking about 41 years ago. His smoking use included cigarettes. He has a 24.00 pack-year smoking history. He has never used smokeless tobacco. He reports previous alcohol use. He reports that he does not use drugs.  No Known Allergies  Family History  Problem Relation Age of Onset   Cancer Mother    Hypertension Mother    Diabetes Sister    Cancer Brother        colon   Hypertension Brother    Hyperlipidemia Brother    Stroke Brother    Hypertension Sister    Colon cancer Neg Hx    Heart attack Neg Hx    Sudden death Neg Hx    Esophageal cancer Neg Hx    Pancreatic cancer Neg Hx    Stomach cancer Neg Hx    Rectal cancer Neg Hx     Prior to Admission medications   Medication Sig Start Date End Date Taking? Authorizing Provider  atorvastatin (LIPITOR) 20 MG tablet Take 1 tablet (20 mg total) by mouth daily. Patient taking differently: Take 20 mg by  mouth in the morning. 04/26/20 04/26/21  Biagio Borg, MD  cephALEXin (KEFLEX) 500 MG capsule Take 1 capsule (500 mg total) by mouth 4 (four) times daily. 09/19/20   Sherrill Raring, PA-C  dorzolamide-timolol (COSOPT) 22.3-6.8 MG/ML ophthalmic solution Place 1 drop into both eyes 2 (two) times daily. 06/15/20   [provider]  famotidine (PEPCID) 20 MG tablet TAKE 1 TABLET BY MOUTH EVERY DAY 09/10/20   Volanda Napoleon, MD  latanoprost (XALATAN) 0.005 % ophthalmic solution Place 1 drop into both eyes at bedtime.  01/09/19   [provider]  lidocaine-prilocaine (EMLA) cream Apply 1 application topically as needed. Place on the port one hour before appointment. 08/24/20   Volanda Napoleon, MD  meclizine (ANTIVERT) 12.5 MG tablet TAKE 1 TABLET BY MOUTH 3 TIMES DAILY AS NEEDED FOR DIZZINESS. Patient taking differently: Take 12.5 mg by mouth in the morning. 08/10/20   Biagio Borg, MD  Multiple Vitamin (MULTIVITAMIN WITH MINERALS) TABS tablet Take 1 tablet by mouth in the morning. Men's One-A-Day Chewable    [provider]  ondansetron (ZOFRAN) 8 MG tablet Take 1  tablet (8 mg total) by mouth every 8 (eight) hours as needed for nausea or vomiting. 09/16/20   Hayden Pedro, PA-C  senna-docusate (SENOKOT S) 8.6-50 MG tablet Take 1 tablet by mouth daily. 09/12/20   Domenic Polite, MD  SYSTANE ULTRA 0.4-0.3 % SOLN Apply 1 drop to eye 3 (three) times daily as needed (dry eyes).  01/09/19   [provider]  traMADol (ULTRAM) 50 MG tablet Take 2 tablets (100 mg total) by mouth at bedtime as needed for moderate pain or severe pain. 04/22/20   Biagio Borg, MD    Physical Exam: Vitals:   09/21/20 1433 09/21/20 1436 09/21/20 1500 09/21/20 1530  BP:  92/62 104/67 93/62  Pulse:  76 76 72  Resp:  15 14 16   Temp: 97.8 F (36.6 C)     TempSrc: Oral     SpO2:  100% 100% 99%    Constitutional: sleep, not arousable Head: Atraumatic Eyes: Conjunctiva clear ENM: Moist mucous  membranes.  Neck: Supple Respiratory: Clear to auscultation bilaterally save for rales at bases Cardiovascular: Regular rate and rhythm. Soft systolic murmur Abdomen: non-tender, moderately distended Musculoskeletal: No joint deformity upper and lower extremities. Normal ROM, no contractures. Normal muscle tone.  Skin: No rashes, lesions, or ulcers.  Extremities: trace LE edema Neurologic: moves all 4 extremities when aroused Psychiatric: unable to assess   Labs on Admission: I have personally reviewed following labs and imaging studies  CBC: Recent Labs  Lab 09/21/20 1126  WBC 12.7*  NEUTROABS 10.2*  HGB 9.7*  HCT 28.4*  MCV 85.0  PLT 354   Basic Metabolic Panel: Recent Labs  Lab 09/21/20 1126  NA 123*  K 3.5  CL 94*  CO2 19*  GLUCOSE 152*  BUN 22  CREATININE 0.96  CALCIUM 7.8*   GFR: Estimated Creatinine Clearance: 58.4 mL/min (by C-G formula based on SCr of 0.96 mg/dL). Liver Function Tests: Recent Labs  Lab 09/21/20 1126  AST 147*  ALT 95*  ALKPHOS 189*  BILITOT 3.6*  PROT 6.2*  ALBUMIN 2.6*   No results for input(s): LIPASE, AMYLASE in the last 168 hours. No results for input(s): AMMONIA in the last 168 hours. Coagulation Profile: Recent Labs  Lab 09/21/20 1126  INR 1.6*   Cardiac Enzymes: No results for input(s): CKTOTAL, CKMB, CKMBINDEX, TROPONINI in the last 168 hours. BNP (last 3 results) No results for input(s): PROBNP in the last 8760 hours. HbA1C: No results for input(s): HGBA1C in the last 72 hours. CBG: No results for input(s): GLUCAP in the last 168 hours. Lipid Profile: No results for input(s): CHOL, HDL, LDLCALC, TRIG, CHOLHDL, LDLDIRECT in the last 72 hours. Thyroid Function Tests: No results for input(s): TSH, T4TOTAL, FREET4, T3FREE, THYROIDAB in the last 72 hours. Anemia Panel: No results for input(s): VITAMINB12, FOLATE, FERRITIN, TIBC, IRON, RETICCTPCT in the last 72 hours. Urine analysis:    Component Value Date/Time    COLORURINE AMBER (A) 09/21/2020 1445   APPEARANCEUR CLEAR 09/21/2020 1445   LABSPEC 1.020 09/21/2020 1445   PHURINE 5.0 09/21/2020 1445   GLUCOSEU NEGATIVE 09/21/2020 1445   GLUCOSEU NEGATIVE 04/22/2020 1246   HGBUR MODERATE (A) 09/21/2020 1445   HGBUR negative 11/30/2009 1450   BILIRUBINUR NEGATIVE 09/21/2020 1445   KETONESUR NEGATIVE 09/21/2020 1445   PROTEINUR NEGATIVE 09/21/2020 1445   UROBILINOGEN 0.2 04/22/2020 1246   NITRITE NEGATIVE 09/21/2020 1445   LEUKOCYTESUR NEGATIVE 09/21/2020 1445    Radiological Exams on Admission: DG Chest 2 View  Result Date:  09/21/2020 CLINICAL DATA:  Weakness and fever for 2 weeks. EXAM: CHEST - 2 VIEW COMPARISON:  03/26/2019 FINDINGS: The cardiomediastinal silhouette is unremarkable. Mild peribronchial thickening again noted. A RIGHT Port-A-Cath is again noted with tip overlying the SUPERIOR cavoatrial junction. There is no evidence of focal airspace disease, pulmonary edema, suspicious pulmonary nodule/mass, pleural effusion, or pneumothorax. No acute bony abnormalities are identified. IMPRESSION: No active cardiopulmonary disease. Electronically Signed   By: Margarette Canada M.D.   On: 09/21/2020 12:23   CT CHEST ABDOMEN PELVIS W CONTRAST  Result Date: 09/21/2020 CLINICAL DATA:  Abdominal pain, fever, weakness. Urinary tract infection. Gastrointestinal cancer. EXAM: CT CHEST, ABDOMEN, AND PELVIS WITH CONTRAST TECHNIQUE: Multidetector CT imaging of the chest, abdomen and pelvis was performed following the standard protocol during bolus administration of intravenous contrast. CONTRAST:  177mL OMNIPAQUE IOHEXOL 300 MG/ML  SOLN COMPARISON:  CT abdomen pelvis 09/10/2020 and CT chest 06/12/2020. MR abdomen 09/04/2020 and PET 06/25/2020 FINDINGS: CT CHEST FINDINGS Cardiovascular: Right IJ Port-A-Cath terminates at the SVC RA junction. Heart is at the upper limits of normal in size. No pericardial effusion. Mediastinum/Nodes: Mediastinal lymph nodes measure up to  11 mm in the low right paratracheal station, similar. Calcified mediastinal lymph nodes. No hilar or axillary adenopathy. Esophagus is grossly. Lungs/Pleura: Centrilobular and paraseptal emphysema. Calcified granulomas. Minimal dependent atelectasis bilaterally. No pleural fluid. Adherent debris in the right mainstem bronchus. Musculoskeletal: Degenerative changes in the spine. No worrisome lytic or sclerotic lesions. CT ABDOMEN PELVIS FINDINGS Hepatobiliary: Similar biliary ductal dilatation with a percutaneous biliary drainage catheter in place, terminating in the duodenum. Associated centrally obstructing mass, better seen on 09/04/2020. 9 mm low-attenuation lesion in the periphery of segment 4 (2/65), unchanged and too small to characterize. Other subcapsular metastases seen on 09/03/2020 are not readily appreciated. Gallbladder is dilated. Pancreas: Mass seen in the pancreatic body on 09/04/2020 is poorly appreciated. Spleen: Negative. Adrenals/Urinary Tract: Adrenal glands are unremarkable. Right renal stone. Kidneys are otherwise unremarkable. Ureters are decompressed. Bladder may be minimally thick-walled. Stomach/Bowel: Distal gastrectomy. Stomach, small bowel and appendix are otherwise unremarkable. Fluid seen in the colon. Vascular/Lymphatic: Vascular structures are unremarkable. No pathologically enlarged lymph nodes. Reproductive: Prostate is visualized. Other: Bilateral omental nodules measure up to 1.4 cm, as on 09/10/2020. Mesenteric nodules measure up to 8 mm in short axis (2/86). No free fluid. Left inguinal hernia contains fat. Musculoskeletal: Degenerative changes in the spine. IMPRESSION: 1. No findings to explain the patient's acute clinical history. 2. Peritoneal/omental metastases, as on 09/10/2020. 3. Percutaneous biliary drain in place with similar gallbladder and biliary ductal dilatation. Associated obstructing mass seen on 09/04/2020 is better seen on that study. 4. Pancreatic body mass,  also better seen on 09/04/2020. 5. Right renal stone. Electronically Signed   By: Lorin Picket M.D.   On: 09/21/2020 14:47    EKG: Independently reviewed nsr  Assessment/Plan Principal Problem:   SIRS (systemic inflammatory response syndrome) ([facility]) Active Problems:   Diabetes ([facility])   Benign nodular prostatic hyperplasia   Gastric cancer pT3pN2 s/p distal gastrectomy with Billroth II gastrojejunostomy 03/19/2019   SIRS due to infectious process with acute organ dysfunction ([place])   # SIRS # Acute metabolic encephalopathy No acute findings on CT but most likely source is biliary given recent percutaneous drain placement for metastatic biliary obstruction. Recent urine culture negative. CT of chest clear. Here encephalopathic with elevated lactate and soft BPs. S/p 3.5 L IVF. Family aware prognosis is guarded. - continue vanc/cefepime/flagyl, will order mrsa screen.  -  f/u urine and blood cultures - continue IVF - f/u biliary drain culture - f/u GI and onc recs - f/u covid result  ADDENDUM 18:45 MAPs in 50s since arrival to floor. Patient received an additional 1 L bolus. MAPs remain in 50s and lactate 2s>3s. IR and GI have discussed case, think cholangitis is possible, considering another IR procedure. - continue broad spectrum abx - start levophed gtt - will consult critical care  # Hyponatremia Likely 2/2 dehydration given reduced PO. Subacute, sodium was 131 1 week ago, 123 today - normal saline, trend  # Anemia Baseline hgb 10-11, here hgb 9.7, per chart record has had intermittent melena as of late. EGD unrevealing when performed last month - monitor - hold pharmacologic dvt ppx for now  # Adenocarcinoma - oncology aware, may involve radiation oncology  # T2DM Not on home meds. Here glucose mildly elevated - monitor daily fasting glucose  DVT prophylaxis: SCDs Code Status: DNR (confirmed w/ wife and grandchildren) Family Communication: wife updated @ bedside and  grandchildren telephonically 6/20  Consults called: oncology, radiation oncology, GI   Level of care: Stepdown    Desma Maxim MD [facility] Pager [phone]  If 7PM-7AM, please contact night-coverage [email or web address] Password [facility]  09/21/2020, 4:34 PM

## 2020-09-21 NOTE — ED Notes (Signed)
Patient transported to CT via stretcher.

## 2020-09-21 NOTE — Consult Note (Signed)
NAME:  Jonathan Lewis, MRN:  [id number], DOB:  01-Mar-1941, LOS: 0 ADMISSION DATE:  09/21/2020, CONSULTATION DATE:  09/21/20 REFERRING MD:  TRH, CHIEF COMPLAINT:  lethargy   History of Present Illness:  History unobtainable from patient due to encephalopathy. History per EMR, wife at bedside. Feeling unwell, weak the last few days. Became more lethargic. Sounds like had appt with oncology team but given concerns was sent to ED. There he was febrile, hypotensive. Got a lot of fluids. Bps down trended. Started on NE which prompted PCCM consult.    Labs notable for leukocytosis with left shift, mild AKI, hyponatremia to 123, LA elevated, LFTs improved from prior. CTAP with persistent biliary ductal dilation, no other acute findings. UA clear, CXR clear.   Pertinent  Medical History  Gastric cancer s/p resection 2020, now with mets on chemo  Significant Hospital Events: Including procedures, antibiotic start and stop dates in addition to other pertinent events   6/19 admitted, hypotensive, presumed biliary source  Interim History / Subjective:  As above  Objective   Blood pressure (!) 70/49, pulse 66, temperature 97.7 F (36.5 C), temperature source Oral, resp. rate 13, SpO2 100 %.        Intake/Output Summary (Last 24 hours) at 09/21/2020 2004 Last data filed at 09/21/2020 1800 Gross per 24 hour  Intake 4617.06 ml  Output 175 ml  Net 4442.06 ml   There were no vitals filed for this visit.  Examination: General: sleepy, easily arouses HENT: AT/[place] Lungs: CTAB, NWOB on RA Cardiovascular: RRR, no murmur Abdomen: NT, ND Extremities: warm, no edema  Neuro: drowsy, attends, answers questions, follows commands   Labs/imaging that I havepersonally reviewed  (right click and "Reselect all SmartList Selections" daily)  CXR, CBC, chemistries, CT AP  Resolved Hospital Problem list   N/a  Assessment & Plan:  Septic shock, severe sepsis with encephalopathy and AKI: UA clear, CXR  clear, on RA, no cough. LFTs improved but with persistent bilary ductal dilation on CT AP. Presumed biliary source. --Vanc, cefepime, flagyl to continue, follow cultures  --MAP > 65, NE, wean with extra volume --addl LR 1L appears dry, bolus as needed as long as respiratory status remains stable --IR c/s pending, no GI intervention available  Toxic Metabolic Encephalopathy: Presumed due to sepsis. --therapy as above  AKI: Cr mildly elevated - suspect hypovolemia and sepsis --fluids, pressors  Best Practice (right click and "Reselect all SmartList Selections" daily)   Per primary  Labs   CBC: Recent Labs  Lab 09/21/20 1126  WBC 12.7*  NEUTROABS 10.2*  HGB 9.7*  HCT 28.4*  MCV 85.0  PLT 338    Basic Metabolic Panel: Recent Labs  Lab 09/21/20 1126  NA 123*  K 3.5  CL 94*  CO2 19*  GLUCOSE 152*  BUN 22  CREATININE 0.96  CALCIUM 7.8*   GFR: Estimated Creatinine Clearance: 58.4 mL/min (by C-G formula based on SCr of 0.96 mg/dL). Recent Labs  Lab 09/21/20 1126 09/21/20 1323  WBC 12.7*  --   LATICACIDVEN 2.6* 3.6*    Liver Function Tests: Recent Labs  Lab 09/21/20 1126  AST 147*  ALT 95*  ALKPHOS 189*  BILITOT 3.6*  PROT 6.2*  ALBUMIN 2.6*   No results for input(s): LIPASE, AMYLASE in the last 168 hours. No results for input(s): AMMONIA in the last 168 hours.  ABG No results found for: PHART, PCO2ART, PO2ART, HCO3, TCO2, ACIDBASEDEF, O2SAT   Coagulation Profile: Recent Labs  Lab 09/21/20 1126  INR 1.6*    Cardiac Enzymes: No results for input(s): CKTOTAL, CKMB, CKMBINDEX, TROPONINI in the last 168 hours.  HbA1C: Hgb A1c MFr Bld  Date/Time Value Ref Range Status  09/10/2020 04:57 AM 6.1 (H) 4.8 - 5.6 % Final    Comment:    (NOTE)         Prediabetes: 5.7 - 6.4         Diabetes: >6.4         Glycemic control for adults with diabetes: <7.0   04/22/2020 12:46 PM 6.6 (H) 4.6 - 6.5 % Final    Comment:    Glycemic Control Guidelines for  People with Diabetes:Non Diabetic:  <6%Goal of Therapy: <7%Additional Action Suggested:  >8%     CBG: No results for input(s): GLUCAP in the last 168 hours.  Review of Systems:   Unobtainable due to patient factors  Past Medical History:  He,  has a past medical history of Acoustic neuroma ([place]) (06/25/2015), Allergic rhinitis (07/18/2014), Allergy, Anemia, Arthritis, BPH (benign prostatic hyperplasia), Cancer ([place]), Carbuncle, Cataract, Diabetes mellitus, Disk prolapse, Glaucoma, Hyperlipidemia, Hypertension, Male hypogonadism (07/16/2014), Neuropathy, Pneumonia, Sinus bradycardia, and Sinusitis (07/29/2012).   Surgical History:   Past Surgical History:  Procedure Laterality Date   ANTERIOR CERVICAL DECOMPRESSION/DISCECTOMY FUSION 4 LEVELS N/A 01/08/2020   Procedure: ANTERIOR CERVICAL DECOMPRESSION FUSION CERVICAL 3-4, CERVICAL 4-5, CERVIAL 5-6 WITH INSTRUMENTATION AND ALLOGRAFT;  Surgeon: Phylliss Bob, MD;  Location: [facility];  Service: Orthopedics;  Laterality: N/A;   BALLOON DILATION N/A 03/12/2019   Procedure: BALLOON DILATION;  Surgeon: Rush Landmark Telford Nab., MD;  Location: [facility];  Service: Gastroenterology;  Laterality: N/A;  pyloric   BIOPSY  03/08/2019   Procedure: BIOPSY;  Surgeon: Yetta Flock, MD;  Location: [facility];  Service: Gastroenterology;;   BIOPSY  03/12/2019   Procedure: BIOPSY;  Surgeon: Irving Copas., MD;  Location: [facility];  Service: Gastroenterology;;   CATARACT EXTRACTION     x 2   COLONOSCOPY     ENDOSCOPIC RETROGRADE CHOLANGIOPANCREATOGRAPHY (ERCP) WITH PROPOFOL N/A 09/09/2020   Procedure: ENDOSCOPIC RETROGRADE CHOLANGIOPANCREATOGRAPHY (ERCP) WITH PROPOFOL;  Surgeon: Irving Copas., MD;  Location: [facility];  Service: Gastroenterology;  Laterality: N/A;   ESOPHAGOGASTRODUODENOSCOPY N/A 03/08/2019   Procedure: ESOPHAGOGASTRODUODENOSCOPY (EGD);  Surgeon: Yetta Flock, MD;  Location: [facility];  Service:  Gastroenterology;  Laterality: N/A;   ESOPHAGOGASTRODUODENOSCOPY (EGD) WITH PROPOFOL N/A 03/12/2019   Procedure: ESOPHAGOGASTRODUODENOSCOPY (EGD) WITH PROPOFOL;  Surgeon: Rush Landmark Telford Nab., MD;  Location: [facility];  Service: Gastroenterology;  Laterality: N/A;   FINE NEEDLE ASPIRATION  03/12/2019   Procedure: FINE NEEDLE ASPIRATION (FNA) LINEAR;  Surgeon: Irving Copas., MD;  Location: [facility];  Service: Gastroenterology;;   FOREIGN BODY REMOVAL  03/08/2019   Procedure: FOREIGN BODY REMOVAL;  Surgeon: Yetta Flock, MD;  Location: [facility];  Service: Gastroenterology;;   IR BILIARY DRAIN PLACEMENT WITH CHOLANGIOGRAM  09/09/2020   IR INT EXT BILIARY DRAIN WITH CHOLANGIOGRAM  09/10/2020   IR PATIENT EVAL TECH 0-60 MINS  04/03/2019   LAPAROTOMY N/A 03/19/2019   Procedure: EXPLORATORY LAPAROTOMY, distal GASTRECTOMY AND PLACEMENT OF G AND J TUBE, gastric jejunostomy;  Surgeon: Greer Pickerel, MD;  Location: [facility];  Service: General;  Laterality: N/A;   POLYPECTOMY     PORTACATH PLACEMENT N/A 03/26/2019   Procedure: INSERTION PORT-A-CATH;  Surgeon: Michael Boston, MD;  Location: [facility];  Service: General;  Laterality: N/A;   STOMACH SURGERY     SUBMUCOSAL TATTOO INJECTION  09/09/2020  Procedure: SUBMUCOSAL TATTOO INJECTION;  Surgeon: Irving Copas., MD;  Location: [facility];  Service: Gastroenterology;;   UPPER ESOPHAGEAL ENDOSCOPIC ULTRASOUND (EUS) N/A 03/12/2019   Procedure: UPPER ESOPHAGEAL ENDOSCOPIC ULTRASOUND (EUS);  Surgeon: Irving Copas., MD;  Location: [facility];  Service: Gastroenterology;  Laterality: N/A;   UPPER GASTROINTESTINAL ENDOSCOPY     VIDEO BRONCHOSCOPY WITH ENDOBRONCHIAL ULTRASOUND N/A 05/22/2019   Procedure: VIDEO BRONCHOSCOPY WITH ENDOBRONCHIAL ULTRASOUND;  Surgeon: Garner Nash, DO;  Location: [facility];  Service: Thoracic;  Laterality: N/A;     Social History:   reports that he quit smoking about 41 years ago. His smoking  use included cigarettes. He has a 24.00 pack-year smoking history. He has never used smokeless tobacco. He reports previous alcohol use. He reports that he does not use drugs.   Family History:  His family history includes Cancer in his brother and mother; Diabetes in his sister; Hyperlipidemia in his brother; Hypertension in his brother, mother, and sister; Stroke in his brother. There is no history of Colon cancer, Heart attack, Sudden death, Esophageal cancer, Pancreatic cancer, Stomach cancer, or Rectal cancer.   Allergies No Known Allergies   Home Medications  Prior to Admission medications   Medication Sig Start Date End Date Taking? Authorizing Provider  acetaminophen (TYLENOL) 160 MG/5ML liquid Take 500 mg by mouth every 4 (four) hours as needed for fever.   Yes [provider]  atorvastatin (LIPITOR) 20 MG tablet Take 1 tablet (20 mg total) by mouth daily. Patient taking differently: Take 20 mg by mouth in the morning. 04/26/20 04/26/21 Yes Biagio Borg, MD  cephALEXin (KEFLEX) 500 MG capsule Take 1 capsule (500 mg total) by mouth 4 (four) times daily. Patient taking differently: Take 500 mg by mouth 4 (four) times daily. Start date : 09/20/20 09/19/20  Yes Sherrill Raring, PA-C  dorzolamide-timolol (COSOPT) 22.3-6.8 MG/ML ophthalmic solution Place 1 drop into both eyes 2 (two) times daily. 06/15/20  Yes [provider]  famotidine (PEPCID) 20 MG tablet TAKE 1 TABLET BY MOUTH EVERY DAY Patient taking differently: Take 20 mg by mouth daily. 09/10/20  Yes Ennever, Rudell Cobb, MD  latanoprost (XALATAN) 0.005 % ophthalmic solution Place 1 drop into both eyes at bedtime.  01/09/19  Yes [provider]  lidocaine-prilocaine (EMLA) cream Apply 1 application topically as needed. Place on the port one hour before appointment. Patient taking differently: Apply 1 application topically daily as needed (port access). Place on the port one hour before appointment. 08/24/20  Yes Volanda Napoleon, MD  meclizine (ANTIVERT) 12.5 MG tablet TAKE 1 TABLET BY MOUTH 3 TIMES DAILY AS NEEDED FOR DIZZINESS. Patient taking differently: Take 12.5 mg by mouth daily as needed for dizziness. 08/10/20  Yes Biagio Borg, MD  Multiple Vitamin (MULTIVITAMIN WITH MINERALS) TABS tablet Take 1 tablet by mouth in the morning. Men's One-A-Day Chewable   Yes [provider]  ondansetron (ZOFRAN) 8 MG tablet Take 1 tablet (8 mg total) by mouth every 8 (eight) hours as needed for nausea or vomiting. 09/16/20  Yes Hayden Pedro, PA-C  senna-docusate (SENOKOT S) 8.6-50 MG tablet Take 1 tablet by mouth daily. 09/12/20  Yes Domenic Polite, MD  SYSTANE ULTRA 0.4-0.3 % SOLN Place 1 drop into both eyes 3 (three) times daily as needed (dry eyes). 01/09/19  Yes [provider]  traMADol (ULTRAM) 50 MG tablet Take 2 tablets (100 mg total) by mouth at bedtime as needed for moderate pain or severe pain.  04/22/20  Yes Biagio Borg, MD     Critical care time:     CRITICAL CARE Performed by: Lanier Clam   Total critical care time: 35 minutes  Critical care time was exclusive of separately billable procedures and treating other patients.  Critical care was necessary to treat or prevent imminent or life-threatening deterioration.  Critical care was time spent personally by me on the following activities: development of treatment plan with patient and/or surrogate as well as nursing, discussions with consultants, evaluation of patient's response to treatment, examination of patient, obtaining history from patient or surrogate, ordering and performing treatments and interventions, ordering and review of laboratory studies, ordering and review of radiographic studies, pulse oximetry and re-evaluation of patient's condition.

## 2020-09-21 NOTE — Consult Note (Signed)
Consultation  Referring Provider: Dr. Si Raider    Primary Care Physician:  Biagio Borg, MD Primary Gastroenterologist: Dr. Havery Moros       Reason for Consultation:    Sepsis, question biliary source         HPI:   Jonathan Lewis is a 80 y.o. male with a past medical history as listed below including diagnosis of gastric adenocarcinoma December 2020, T3N2 and underwent distal gastrectomy/Billroth II in December 2020 and undergoing chemotherapy, as well as recent PBD stent placement 09/10/2020 during recent hospitalization for biliary obstruction secondary to metastatic cancer, who presented to the ER today with fatigue thought most likely from biliary source.    08/05/2020 patient seen in clinic by Nicoletta Ba, PA-C for bleeding.  At that time discussed some bright red blood in his bowel movements.  PET scan in March 2020 which showed recurrent disease in the abdomen with new mets in the liver, evidence of omental metastatic disease and a probable metastatic lesion in the pancreas with stable mediastinal nodes.  Last EGD December 2020 at time of diagnosis.  Grade D esophagitis that time as well.  Last colonoscopy March 2017 with finding external hemorrhoids, diverticulosis and polyps.  At that time suspected that bleeding was of anorectal origin.  Repeated CBC and recommend to stay off aspirin.  Patient scheduled for EGD with Dr. Havery Moros.    08/07/2020 EGD with esophageal plaques consistent with esophageal candidiasis as well as patent Billroth II gastrojejunostomy characterized by friable mucosa with slight nodularity.    09/09/2020 ERCP for abnormal MRCP jaundice and elevated AST as well as common bile duct stricture with no gross lesions in the esophagus, small hiatal hernia, no gross lesions in the stomach: The gastric gastrojejunostomy characterized by congestion, cannulation of the biliary tree was not successful with pediatric colonoscope using Bilroth sphincterotome and tapered sphincterotome.   Attempted getting duodenoscope to the ampullary orifice was not possible due to angulation and overall length of scope required to reach the area.  Patient was admitted to the hospital ward for IR evaluation and PBD placement.    09/11/2020 our patient signed off after patient had PBD placement.  LFTs are improving.    At time of my interview the patient would not wake up long enough to answer any of my questions.  History is garnered from previous physicians notes.  Apparently patient's wife reported that he has been fatigued for some weeks but for the last couple of days much more fatigued, not interactive and not getting out of bed.  She denied any reported chest pain, cough, vomiting or diarrhea.  ED course: Hyponatremia, temp 102, code sepsis, given 3.5 L fluids and vancomycin/ceftriaxone/Flagyl, CT of the abdomen pelvis unrevealing, oncology has been consulted and advises treating for biliary infection  Past Medical History:  Diagnosis Date   Acoustic neuroma ([place]) 06/25/2015   Allergic rhinitis 07/18/2014   Allergy    Anemia    Arthritis    BPH (benign prostatic hyperplasia)    Cancer ([place])    stage III stomach-Dx 03/2019   Carbuncle    recurrent MRSA carbuncles   Cataract    Per pt bilateral cataracts removed.   Diabetes mellitus    Type II. Per pt Dr. Jenny Reichmann took him off his dm med.   Disk prolapse    Glaucoma    Hyperlipidemia    Hypertension    Male hypogonadism 07/16/2014   Neuropathy    Pneumonia    Sinus  bradycardia    chronic, asymptomatic   Sinusitis 07/29/2012    Past Surgical History:  Procedure Laterality Date   ANTERIOR CERVICAL DECOMPRESSION/DISCECTOMY FUSION 4 LEVELS N/A 01/08/2020   Procedure: ANTERIOR CERVICAL DECOMPRESSION FUSION CERVICAL 3-4, CERVICAL 4-5, CERVIAL 5-6 WITH INSTRUMENTATION AND ALLOGRAFT;  Surgeon: Phylliss Bob, MD;  Location: [place];  Service: Orthopedics;  Laterality: N/A;   BALLOON DILATION N/A 03/12/2019   Procedure: BALLOON DILATION;   Surgeon: Rush Landmark Telford Nab., MD;  Location: [facility];  Service: Gastroenterology;  Laterality: N/A;  pyloric   BIOPSY  03/08/2019   Procedure: BIOPSY;  Surgeon: Yetta Flock, MD;  Location: [facility];  Service: Gastroenterology;;   BIOPSY  03/12/2019   Procedure: BIOPSY;  Surgeon: Irving Copas., MD;  Location: [facility];  Service: Gastroenterology;;   CATARACT EXTRACTION     x 2   COLONOSCOPY     ENDOSCOPIC RETROGRADE CHOLANGIOPANCREATOGRAPHY (ERCP) WITH PROPOFOL N/A 09/09/2020   Procedure: ENDOSCOPIC RETROGRADE CHOLANGIOPANCREATOGRAPHY (ERCP) WITH PROPOFOL;  Surgeon: Irving Copas., MD;  Location: [facility];  Service: Gastroenterology;  Laterality: N/A;   ESOPHAGOGASTRODUODENOSCOPY N/A 03/08/2019   Procedure: ESOPHAGOGASTRODUODENOSCOPY (EGD);  Surgeon: Yetta Flock, MD;  Location: [facility];  Service: Gastroenterology;  Laterality: N/A;   ESOPHAGOGASTRODUODENOSCOPY (EGD) WITH PROPOFOL N/A 03/12/2019   Procedure: ESOPHAGOGASTRODUODENOSCOPY (EGD) WITH PROPOFOL;  Surgeon: Rush Landmark Telford Nab., MD;  Location: [facility];  Service: Gastroenterology;  Laterality: N/A;   FINE NEEDLE ASPIRATION  03/12/2019   Procedure: FINE NEEDLE ASPIRATION (FNA) LINEAR;  Surgeon: Irving Copas., MD;  Location: [facility];  Service: Gastroenterology;;   FOREIGN BODY REMOVAL  03/08/2019   Procedure: FOREIGN BODY REMOVAL;  Surgeon: Yetta Flock, MD;  Location: [facility];  Service: Gastroenterology;;   IR BILIARY DRAIN PLACEMENT WITH CHOLANGIOGRAM  09/09/2020   IR INT EXT BILIARY DRAIN WITH CHOLANGIOGRAM  09/10/2020   IR PATIENT EVAL TECH 0-60 MINS  04/03/2019   LAPAROTOMY N/A 03/19/2019   Procedure: EXPLORATORY LAPAROTOMY, distal GASTRECTOMY AND PLACEMENT OF G AND J TUBE, gastric jejunostomy;  Surgeon: Greer Pickerel, MD;  Location: [facility];  Service: General;  Laterality: N/A;   POLYPECTOMY     PORTACATH PLACEMENT N/A 03/26/2019   Procedure:  INSERTION PORT-A-CATH;  Surgeon: Michael Boston, MD;  Location: [facility];  Service: General;  Laterality: N/A;   [place] INJECTION  09/09/2020   Procedure: SUBMUCOSAL TATTOO INJECTION;  Surgeon: Irving Copas., MD;  Location: [facility];  Service: Gastroenterology;;   UPPER ESOPHAGEAL ENDOSCOPIC ULTRASOUND (EUS) N/A 03/12/2019   Procedure: UPPER ESOPHAGEAL ENDOSCOPIC ULTRASOUND (EUS);  Surgeon: Irving Copas., MD;  Location: [facility];  Service: Gastroenterology;  Laterality: N/A;   UPPER GASTROINTESTINAL ENDOSCOPY     VIDEO BRONCHOSCOPY WITH ENDOBRONCHIAL ULTRASOUND N/A 05/22/2019   Procedure: VIDEO BRONCHOSCOPY WITH ENDOBRONCHIAL ULTRASOUND;  Surgeon: Garner Nash, DO;  Location: [facility];  Service: Thoracic;  Laterality: N/A;    Family History  Problem Relation Age of Onset   Cancer Mother    Hypertension Mother    Diabetes Sister    Cancer Brother        colon   Hypertension Brother    Hyperlipidemia Brother    Stroke Brother    Hypertension Sister    Colon cancer Neg Hx    Heart attack Neg Hx    Sudden death Neg Hx    Esophageal cancer Neg Hx    Pancreatic cancer Neg Hx    Stomach cancer Neg Hx  Rectal cancer Neg Hx     Social History   Tobacco Use   Smoking status: Former    Packs/day: 1.00    Years: 24.00    Pack years: 24.00    Types: Cigarettes    Quit date: 10/03/1978    Years since quitting: 41.9   Smokeless tobacco: Never  Vaping Use   Vaping Use: Never used  Substance Use Topics   Alcohol use: Not Currently   Drug use: No    Prior to Admission medications   Medication Sig Start Date End Date Taking? Authorizing Provider  atorvastatin (LIPITOR) 20 MG tablet Take 1 tablet (20 mg total) by mouth daily. Patient taking differently: Take 20 mg by mouth in the morning. 04/26/20 04/26/21  Biagio Borg, MD  cephALEXin (KEFLEX) 500 MG capsule Take 1 capsule (500 mg total) by mouth 4 (four) times daily. 09/19/20    Sherrill Raring, PA-C  dorzolamide-timolol (COSOPT) 22.3-6.8 MG/ML ophthalmic solution Place 1 drop into both eyes 2 (two) times daily. 06/15/20   [provider]  famotidine (PEPCID) 20 MG tablet TAKE 1 TABLET BY MOUTH EVERY DAY 09/10/20   Volanda Napoleon, MD  latanoprost (XALATAN) 0.005 % ophthalmic solution Place 1 drop into both eyes at bedtime.  01/09/19   [provider]  lidocaine-prilocaine (EMLA) cream Apply 1 application topically as needed. Place on the port one hour before appointment. 08/24/20   Volanda Napoleon, MD  meclizine (ANTIVERT) 12.5 MG tablet TAKE 1 TABLET BY MOUTH 3 TIMES DAILY AS NEEDED FOR DIZZINESS. Patient taking differently: Take 12.5 mg by mouth in the morning. 08/10/20   Biagio Borg, MD  Multiple Vitamin (MULTIVITAMIN WITH MINERALS) TABS tablet Take 1 tablet by mouth in the morning. Men's One-A-Day Chewable    [provider]  ondansetron (ZOFRAN) 8 MG tablet Take 1 tablet (8 mg total) by mouth every 8 (eight) hours as needed for nausea or vomiting. 09/16/20   Hayden Pedro, PA-C  senna-docusate (SENOKOT S) 8.6-50 MG tablet Take 1 tablet by mouth daily. 09/12/20   Domenic Polite, MD  SYSTANE ULTRA 0.4-0.3 % SOLN Apply 1 drop to eye 3 (three) times daily as needed (dry eyes).  01/09/19   [provider]  traMADol (ULTRAM) 50 MG tablet Take 2 tablets (100 mg total) by mouth at bedtime as needed for moderate pain or severe pain. 04/22/20   Biagio Borg, MD    Current Facility-Administered Medications  Medication Dose Route Frequency Provider Last Rate Last Admin   0.9 %  sodium chloride infusion   Intravenous Continuous Wouk, Ailene Rud, MD       dorzolamide-timolol (COSOPT) 22.3-6.8 MG/ML ophthalmic solution 1 drop  1 drop Both Eyes BID Wouk, Ailene Rud, MD       latanoprost (XALATAN) 0.005 % ophthalmic solution 1 drop  1 drop Both Eyes QHS Wouk, Ailene Rud, MD       metroNIDAZOLE (FLAGYL) IVPB 500 mg  500 mg Intravenous Q8H  Wouk, Ailene Rud, MD       sodium chloride (PF) 0.9 % injection            Current Outpatient Medications  Medication Sig Dispense Refill   atorvastatin (LIPITOR) 20 MG tablet Take 1 tablet (20 mg total) by mouth daily. (Patient taking differently: Take 20 mg by mouth in the morning.) 90 tablet 3   cephALEXin (KEFLEX) 500 MG capsule Take 1 capsule (500 mg total) by mouth 4 (four) times daily. 20 capsule 0  dorzolamide-timolol (COSOPT) 22.3-6.8 MG/ML ophthalmic solution Place 1 drop into both eyes 2 (two) times daily.     famotidine (PEPCID) 20 MG tablet TAKE 1 TABLET BY MOUTH EVERY DAY 90 tablet 0   latanoprost (XALATAN) 0.005 % ophthalmic solution Place 1 drop into both eyes at bedtime.      lidocaine-prilocaine (EMLA) cream Apply 1 application topically as needed. Place on the port one hour before appointment. 30 g 3   meclizine (ANTIVERT) 12.5 MG tablet TAKE 1 TABLET BY MOUTH 3 TIMES DAILY AS NEEDED FOR DIZZINESS. (Patient taking differently: Take 12.5 mg by mouth in the morning.) 30 tablet 2   Multiple Vitamin (MULTIVITAMIN WITH MINERALS) TABS tablet Take 1 tablet by mouth in the morning. Men's One-A-Day Chewable     ondansetron (ZOFRAN) 8 MG tablet Take 1 tablet (8 mg total) by mouth every 8 (eight) hours as needed for nausea or vomiting. 30 tablet 3   senna-docusate (SENOKOT S) 8.6-50 MG tablet Take 1 tablet by mouth daily. 30 tablet 0   SYSTANE ULTRA 0.4-0.3 % SOLN Apply 1 drop to eye 3 (three) times daily as needed (dry eyes).      traMADol (ULTRAM) 50 MG tablet Take 2 tablets (100 mg total) by mouth at bedtime as needed for moderate pain or severe pain. 60 tablet 5    Allergies as of 09/21/2020   (No Known Allergies)     Review of Systems:    Unable to complete due to patient's lethargy.   Physical Exam:  Vital signs in last 24 hours: Temp:  [97.8 F (36.6 C)-102 F (38.9 C)] 97.8 F (36.6 C) (06/20 1433) Pulse Rate:  [72-99] 72 (06/20 1530) Resp:  [14-24] 16 (06/20  1530) BP: (89-104)/(58-67) 93/62 (06/20 1530) SpO2:  [97 %-100 %] 99 % (06/20 1530)   General:   Pleasant AA male appears to be in NAD, Well developed, Well nourished, Asleep Head:  Normocephalic and atraumatic. Eyes:   PEERL, EOMI. No icterus. Conjunctiva pink. Ears:  Normal auditory acuity. Neck:  Supple Throat: unable to exam as patient is asleep Lungs: Respirations even and unlabored. Lungs clear to auscultation bilaterally.   No wheezes, crackles, or rhonchi.  Heart: Normal S1, S2. No MRG. Regular rate and rhythm. No peripheral edema, cyanosis or pallor.  Abdomen:  Soft, nondistended, nontender. No rebound or guarding. Normal bowel sounds. No appreciable masses or hepatomegaly.+biliary drain with 200cc clear yellow discharge Rectal:  Not performed.  Msk:  Symmetrical without gross deformities. Peripheral pulses intact.  Extremities:  Without edema, no deformity or joint abnormality.  Neurologic:  Asleep Skin:   Dry and intact without significant lesions or rashes. Psychiatric: Asleep, opens eyes to sternal rub, but closes them again immediately after   LAB RESULTS: Recent Labs    09/21/20 1126  WBC 12.7*  HGB 9.7*  HCT 28.4*  PLT 155   BMET Recent Labs    09/21/20 1126  NA 123*  K 3.5  CL 94*  CO2 19*  GLUCOSE 152*  BUN 22  CREATININE 0.96  CALCIUM 7.8*   LFT Recent Labs    09/21/20 1126  PROT 6.2*  ALBUMIN 2.6*  AST 147*  ALT 95*  ALKPHOS 189*  BILITOT 3.6*   PT/INR Recent Labs    09/21/20 1126  LABPROT 19.1*  INR 1.6*    STUDIES: DG Chest 2 View  Result Date: 09/21/2020 CLINICAL DATA:  Weakness and fever for 2 weeks. EXAM: CHEST - 2 VIEW COMPARISON:  03/26/2019 FINDINGS: The  cardiomediastinal silhouette is unremarkable. Mild peribronchial thickening again noted. A RIGHT Port-A-Cath is again noted with tip overlying the SUPERIOR cavoatrial junction. There is no evidence of focal airspace disease, pulmonary edema, suspicious pulmonary nodule/mass,  pleural effusion, or pneumothorax. No acute bony abnormalities are identified. IMPRESSION: No active cardiopulmonary disease. Electronically Signed   By: Margarette Canada M.D.   On: 09/21/2020 12:23   CT CHEST ABDOMEN PELVIS W CONTRAST  Result Date: 09/21/2020 CLINICAL DATA:  Abdominal pain, fever, weakness. Urinary tract infection. Gastrointestinal cancer. EXAM: CT CHEST, ABDOMEN, AND PELVIS WITH CONTRAST TECHNIQUE: Multidetector CT imaging of the chest, abdomen and pelvis was performed following the standard protocol during bolus administration of intravenous contrast. CONTRAST:  178mL OMNIPAQUE IOHEXOL 300 MG/ML  SOLN COMPARISON:  CT abdomen pelvis 09/10/2020 and CT chest 06/12/2020. MR abdomen 09/04/2020 and PET 06/25/2020 FINDINGS: CT CHEST FINDINGS Cardiovascular: Right IJ Port-A-Cath terminates at the SVC RA junction. Heart is at the upper limits of normal in size. No pericardial effusion. Mediastinum/Nodes: Mediastinal lymph nodes measure up to 11 mm in the low right paratracheal station, similar. Calcified mediastinal lymph nodes. No hilar or axillary adenopathy. Esophagus is grossly. Lungs/Pleura: Centrilobular and paraseptal emphysema. Calcified granulomas. Minimal dependent atelectasis bilaterally. No pleural fluid. Adherent debris in the right mainstem bronchus. Musculoskeletal: Degenerative changes in the spine. No worrisome lytic or sclerotic lesions. CT ABDOMEN PELVIS FINDINGS Hepatobiliary: Similar biliary ductal dilatation with a percutaneous biliary drainage catheter in place, terminating in the duodenum. Associated centrally obstructing mass, better seen on 09/04/2020. 9 mm low-attenuation lesion in the periphery of segment 4 (2/65), unchanged and too small to characterize. Other subcapsular metastases seen on 09/03/2020 are not readily appreciated. Gallbladder is dilated. Pancreas: Mass seen in the pancreatic body on 09/04/2020 is poorly appreciated. Spleen: Negative. Adrenals/Urinary Tract:  Adrenal glands are unremarkable. Right renal stone. Kidneys are otherwise unremarkable. Ureters are decompressed. Bladder may be minimally thick-walled. Stomach/Bowel: Distal gastrectomy. Stomach, small bowel and appendix are otherwise unremarkable. Fluid seen in the colon. Vascular/Lymphatic: Vascular structures are unremarkable. No pathologically enlarged lymph nodes. Reproductive: Prostate is visualized. Other: Bilateral omental nodules measure up to 1.4 cm, as on 09/10/2020. Mesenteric nodules measure up to 8 mm in short axis (2/86). No free fluid. Left inguinal hernia contains fat. Musculoskeletal: Degenerative changes in the spine. IMPRESSION: 1. No findings to explain the patient's acute clinical history. 2. Peritoneal/omental metastases, as on 09/10/2020. 3. Percutaneous biliary drain in place with similar gallbladder and biliary ductal dilatation. Associated obstructing mass seen on 09/04/2020 is better seen on that study. 4. Pancreatic body mass, also better seen on 09/04/2020. 5. Right renal stone. Electronically Signed   By: Lorin Picket M.D.   On: 09/21/2020 14:47      Impression / Plan:   Impression: 1.  Sepsis: Thought most likely from biliary source but would advise ruling out other causes given that LFTs are improving 2.  History of stage IIIa adenocarcinoma of the stomach: Status post distal gastrectomy December 2020, now with metastatic disease getting Taxol, was on Cyramza recently held due to GI bleeding, presenting with sepsis after PBD placement 09/10/2020 for obstructive jaundice and biliary dilation 3.  Obstructive jaundice: MRCP showed diffuse biliary duct dilation with a soft tissue mass in the porta hepatis, likely metastatic, ERCP 09/09/2020 and the bile duct could not be cannulated, IR placed PBD 09/10/2020  Plan: 1.  Agree with recommendations for broad-spectrum antibiotics.  From a GI perspective there is not anything else we can do.  Aware the  patient has been moved to  DNR. 2.  Please await any further recommendations from Dr. Havery Moros later today.  Thank you for your kind consultation, we will likely sign off.  Lavone Nian Jonathan Lewis  09/21/2020, 4:03 PM

## 2020-09-21 NOTE — Progress Notes (Signed)
Pharmacy Antibiotic Note  Jonathan Lewis is a 80 y.o. male with a h/o metastatic adenocarcinoma s/p billroth 2 recently admitted for biliary obstruction s/p IR stent.  Pharmacy has been consulted for vancomycin and cefepime dosing for IAI.  Plan: Vancomycin 1500 mg IV Q 24 hrs. Goal AUC 400-550. Expected AUC: 498 SCr used: 0.96  Cefepime 2 g iv q 8 hours  SCr is slightly less than 60 ml/min required for q 8 h dosing but expect SCr to improve with hydration. Will f/u AM labs and adjust accordingly.      Temp (24hrs), Avg:99.9 F (37.7 C), Min:97.8 F (36.6 C), Max:102 F (38.9 C)  Recent Labs  Lab 09/21/20 1126  WBC 12.7*  CREATININE 0.96  LATICACIDVEN 2.6*    Estimated Creatinine Clearance: 58.4 mL/min (by C-G formula based on SCr of 0.96 mg/dL).    No Known Allergies  Antimicrobials this admission: 6/20 vanc >>  6/20 cefepime >>   Dose adjustments this admission:  Microbiology results: 6/20 covid/flu: neg/neg 6/20 BCx:  6/20 UCx:   6/20 bile cx:  MRSA PCR:   Thank you for allowing pharmacy to be a part of this patient's care.  Ulice Dash D 09/21/2020 4:06 PM

## 2020-09-21 NOTE — Telephone Encounter (Signed)
Call received from patient's wife to inform Dr. Marin Olp that pt is not able to get out of bed this morning, is extremely weak and is unable to make it to his appt this morning.  Informed pt.'s wife per order of Dr. Marin Olp to call an ambulance to bring pt to the ER at [facility].  Pt.'s wife states that she will call an ambulance now. Dr. Marin Olp aware.

## 2020-09-21 NOTE — Progress Notes (Signed)
A consult was received from an ED physician for Vancomycin & Cefepime per pharmacy dosing.  The patient's profile has been reviewed for ht/wt/allergies/indication/available labs.    A one time order has been placed for Cefepime 2gm & Vancomycin 1500mg .  Further antibiotics/pharmacy consults should be ordered by admitting physician if indicated.                       Thank you,  Minda Ditto PharmD 09/21/2020  11:56 AM

## 2020-09-21 NOTE — ED Triage Notes (Signed)
Pt complains oif generalized weakness x3 days, was dx w/ a UTI 3 days ago, febrile at 100.2 w/ EMS, gave 1,000 mg Tylenol. CBG was 186, RR 28.

## 2020-09-22 ENCOUNTER — Inpatient Hospital Stay (HOSPITAL_COMMUNITY): Payer: Medicare PPO

## 2020-09-22 ENCOUNTER — Encounter (HOSPITAL_COMMUNITY): Payer: Self-pay | Admitting: Obstetrics and Gynecology

## 2020-09-22 ENCOUNTER — Ambulatory Visit
Admission: RE | Admit: 2020-09-22 | Discharge: 2020-09-22 | Disposition: A | Discharge status: Home / Self Care | Payer: Medicare PPO | Source: Ambulatory Visit | Attending: Radiation Oncology | Admitting: Radiation Oncology

## 2020-09-22 ENCOUNTER — Other Ambulatory Visit: Payer: Self-pay

## 2020-09-22 DIAGNOSIS — A419 Sepsis, unspecified organism: Secondary | ICD-10-CM

## 2020-09-22 DIAGNOSIS — C169 Malignant neoplasm of stomach, unspecified: Secondary | ICD-10-CM | POA: Diagnosis not present

## 2020-09-22 DIAGNOSIS — R6510 Systemic inflammatory response syndrome (SIRS) of non-infectious origin without acute organ dysfunction: Secondary | ICD-10-CM | POA: Diagnosis not present

## 2020-09-22 DIAGNOSIS — R7881 Bacteremia: Secondary | ICD-10-CM

## 2020-09-22 DIAGNOSIS — R6521 Severe sepsis with septic shock: Secondary | ICD-10-CM

## 2020-09-22 DIAGNOSIS — K831 Obstruction of bile duct: Secondary | ICD-10-CM | POA: Diagnosis not present

## 2020-09-22 DIAGNOSIS — R599 Enlarged lymph nodes, unspecified: Secondary | ICD-10-CM

## 2020-09-22 DIAGNOSIS — A4150 Gram-negative sepsis, unspecified: Secondary | ICD-10-CM

## 2020-09-22 LAB — BLOOD CULTURE ID PANEL (REFLEXED) - BCID2

## 2020-09-22 LAB — BLOOD GAS, VENOUS
Acid-base deficit: 3.3 mmol/L — ABNORMAL HIGH (ref 0.0–2.0)
Bicarbonate: 22 mmol/L (ref 20.0–28.0)
O2 Saturation: 26.9 %
Patient temperature: 98.6
pCO2, Ven: 42 mmHg — ABNORMAL LOW (ref 44.0–60.0)
pH, Ven: 7.339 (ref 7.250–7.430)
pO2, Ven: <32 mmHg — CL (ref 32.0–45.0)

## 2020-09-22 LAB — COMPREHENSIVE METABOLIC PANEL
ALT: 74 U/L — ABNORMAL HIGH (ref 0–44)
AST: 103 U/L — ABNORMAL HIGH (ref 15–41)
Albumin: 2.1 g/dL — ABNORMAL LOW (ref 3.5–5.0)
Alkaline Phosphatase: 165 U/L — ABNORMAL HIGH (ref 38–126)
Anion gap: 3 — ABNORMAL LOW (ref 5–15)
BUN: 17 mg/dL (ref 8–23)
CO2: 21 mmol/L — ABNORMAL LOW (ref 22–32)
Calcium: 7.2 mg/dL — ABNORMAL LOW (ref 8.9–10.3)
Chloride: 108 mmol/L (ref 98–111)
Creatinine, Ser: 0.81 mg/dL (ref 0.61–1.24)
GFR, Estimated: >60 mL/min (ref >60)
Glucose, Bld: 151 mg/dL — ABNORMAL HIGH (ref 70–99)
Potassium: 3.6 mmol/L (ref 3.5–5.1)
Sodium: 132 mmol/L — ABNORMAL LOW (ref 135–145)
Total Bilirubin: 3.3 mg/dL — ABNORMAL HIGH (ref 0.3–1.2)
Total Protein: 5.1 g/dL — ABNORMAL LOW (ref 6.5–8.1)

## 2020-09-22 LAB — CBC
HCT: 26.9 % — ABNORMAL LOW (ref 39.0–52.0)
Hemoglobin: 9.2 g/dL — ABNORMAL LOW (ref 13.0–17.0)
MCH: 29 pg (ref 26.0–34.0)
MCHC: 34.2 g/dL (ref 30.0–36.0)
MCV: 84.9 fL (ref 80.0–100.0)
Platelets: 105 10*3/uL — ABNORMAL LOW (ref 150–400)
RBC: 3.17 MIL/uL — ABNORMAL LOW (ref 4.22–5.81)
RDW: 13.1 % (ref 11.5–15.5)
WBC: 10 10*3/uL (ref 4.0–10.5)
nRBC: 0 % (ref 0.0–0.2)

## 2020-09-22 LAB — ECHOCARDIOGRAM COMPLETE
Area-P 1/2: 2.91 cm2
Calc EF: 55.2 %
S' Lateral: 3.7 cm
Single Plane A2C EF: 48.6 %
Single Plane A4C EF: 60 %

## 2020-09-22 LAB — PROTIME-INR
INR: 1.8 — ABNORMAL HIGH (ref 0.8–1.2)
Prothrombin Time: 21.2 seconds — ABNORMAL HIGH (ref 11.4–15.2)

## 2020-09-22 LAB — GLUCOSE, CAPILLARY: Glucose-Capillary: 97 mg/dL (ref 70–99)

## 2020-09-22 MED ORDER — POTASSIUM CHLORIDE 10 MEQ/50ML IV SOLN
10.0000 meq | INTRAVENOUS | Status: AC
  Administered 2020-09-22 (×4): 10 meq via INTRAVENOUS
  Filled 2020-09-22 (×4): qty 50

## 2020-09-22 NOTE — TOC Initial Note (Signed)
Transition of Care [facility]) - Initial/Assessment Note    Patient Details  Name: Jonathan Lewis MRN: [id number] Date of Birth: 16-Mar-1941  Transition [facility] CM/SW Contact:    Leeroy Cha, RN Phone Number: 09/22/2020, 7:45 AM  Clinical Narrative:                 80 y.o. 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   Recent hospitalization for biliary obstruction 2/2 metastatic cancer. Unable to place biliary stent so IR percutaneous stent placed.   Patient presented to our ED 2 days ago for urinary cather bag replacement. Found to be febrile, prescribed keflex empirically for uti. Culture since returned negative.   Wife reports that patient has been fatigued for some weeks but for last couple of days much more fatigued, not interactive, not getting out of bed. No report of chest pain or cough. No report of vomiting or diarrhea.   ED Course:    Labs show hyponatremia. Temp 102, code sepsis. Given 3.5 L fluids and vanc/cef/flatyl. CT of abdomen/pelvix unrevealing. Oncology consulted, advises treating for biliary infection  TOC PLAN: From home with wife, plan is to return to home with wife and may need home hospice.  Will follow for progression. Expected Discharge Plan: Home w Hospice Care Barriers to Discharge: Continued Medical Work up   Patient Goals and CMS Choice        Expected Discharge Plan and Services Expected Discharge Plan: [place]   Discharge Planning Services: CM Consult   Living arrangements for the past 2 months: Single Family Home                                      Prior Living Arrangements/Services Living arrangements for the past 2 months: Single Family Home Lives with:: Spouse Patient language and need for interpreter reviewed:: Yes              Criminal Activity/Legal Involvement Pertinent to Current  Situation/Hospitalization: No - Comment as needed  Activities of Daily Living      Permission Sought/Granted                  Emotional Assessment Appearance:: Appears stated age Attitude/Demeanor/Rapport: Sedated Affect (typically observed): Calm Orientation: : Oriented to Place, Oriented to Self, Oriented to  Time, Oriented to Situation Alcohol / Substance Use: Not Applicable Psych Involvement: No (comment)  Admission diagnosis:  Adenocarcinoma of stomach ([place]) [C16.9] SIRS due to infectious process with acute organ dysfunction ([place]) [A41.9, R65.20] Abdominal pain [R10.9] Sepsis, due to unspecified organism, unspecified whether acute organ dysfunction present [facility]) [A41.9] Patient Active Problem List   Diagnosis Date Noted   SIRS (systemic inflammatory response syndrome) ([place]) 09/21/2020   Sepsis ([place]) 09/21/2020   Malignant neoplasm metastatic to intra-abdominal lymph node ([place]) 09/16/2020   Biliary obstruction 09/09/2020   Right knee pain 04/22/2020   Myelopathy ([place]) 01/08/2020   Iron deficiency anemia 06/04/2019   Abnormal PET scan, mediastinum 05/29/2019   Mediastinal adenopathy 05/22/2019   Goals of care, counseling/discussion 04/29/2019   Gastric cancer pT3pN2 s/p distal gastrectomy with Billroth II gastrojejunostomy 03/19/2019 03/24/2019   H/O exploratory laparotomy 03/19/2019   Malnutrition of moderate degree 03/08/2019   Gastric outlet obstruction 03/07/2019   Weight loss 03/05/2019   Nausea and vomiting 03/05/2019   Decreased  appetite 03/05/2019   Epigastric pain 12/05/2018   Hyperglycemia 12/05/2018   DDD (degenerative disc disease), cervical 04/16/2018   Spinal stenosis in cervical region 04/16/2018   Lumbar radiculopathy 01/02/2018   Leg pain, bilateral 11/08/2017   Bursitis of right hip 05/10/2017   Left shoulder pain 05/10/2017   Physical deconditioning 05/10/2017   Wheezing 12/13/2016   Cough 11/03/2016   Neck pain on left side 11/03/2016    Bilateral leg paresthesia 11/10/2015   Sensorineural hearing loss (SNHL) of both ears 07/27/2015   Tinnitus of both ears 07/27/2015   Left acoustic neuroma ([place]) 06/25/2015   Hematochezia 05/13/2015   Abnormal finding on MRI of brain 05/13/2015   Vertigo 05/05/2015   GERD (gastroesophageal reflux disease) 05/05/2015   BPH (benign prostatic hyperplasia) 05/05/2015   Bradycardia    Chest pain 07/18/2014   Allergic rhinitis 07/18/2014   Pain of right thumb 07/18/2014   Male hypogonadism 07/16/2014   Encounter for well adult exam with abnormal findings 07/17/2013   Right sided sciatica 07/17/2013   Constipation 01/21/2012   Leukopenia 06/11/2011   HEARING LOSS 04/10/2009   GROIN PAIN 01/26/2009   Osteoarthritis, hand 10/27/2008   LOW BACK PAIN, CHRONIC 10/27/2008   Diabetes ([place]) 07/17/2008   Hyperlipidemia 06/06/2007   EXTERNAL HEMORRHOIDS 02/15/2007   Benign nodular prostatic hyperplasia 02/15/2007   ERECTILE DYSFUNCTION 10/31/2006   Hypertension complicating diabetes ([place]) 10/31/2006   PCP:  Biagio Borg, MD Pharmacy:   [id number] - [place], [place] [address] Phone: [phone] Fax: [phone]     Social Determinants of Health (SDOH) Interventions    Readmission Risk Interventions Readmission Risk Prevention Plan 03/20/2019  Transportation Screening Complete  PCP or Specialist Appt within 5-7 Days Complete  Home Care Screening Complete  Medication Review (RN CM) Complete  Some recent data might be hidden

## 2020-09-22 NOTE — Progress Notes (Signed)
Chief Complaint: Patient was seen in consultation today for biliary drain check  Referring Physician(s): Dr. Si Raider Dr. Marin Olp    Supervising Physician: Markus Daft  Patient Status: [facility] - In-pt  History of Present Illness: Jonathan Lewis is a 80 y.o. male with metastatic stomach cancer. Developed obstructive jaundice and had to have PTC with I/E biliary drain placed on 6/9 Has now been admitted with sepsis/bacteremia and concern for biliary tube dysfunction. Also concern infectious source from GB/biliary tree. Pt and wife state has been doing fairly well. Denies abd pain, N/V. States empty between 600-853m per day from biliary drain bag. Blood cx + for Klebsiella Pt also has port in right chest, surgically placed in 2020 by Dr. GJohney MainePMHx, meds, labs, imaging, allergies reviewed. T. Bili has trended down well since Bili drain placement 10.2---->3.3 Family at bedside.   Past Medical History:  Diagnosis Date   Acoustic neuroma ([place] 06/25/2015   Allergic rhinitis 07/18/2014   Allergy    Anemia    Arthritis    BPH (benign prostatic hyperplasia)    [place]cer [place]    stage III stomach-Dx 03/2019   Carbuncle    recurrent MRSA carbuncles   Cataract    Per pt bilateral cataracts removed.   Diabetes mellitus    Type II. Per pt Dr. JJenny Reichmanntook him off his dm med.   Disk prolapse    Glaucoma    Hyperlipidemia    Hypertension    Male hypogonadism 07/16/2014   Neuropathy    Pneumonia    Sinus bradycardia    chronic, asymptomatic   Sinusitis 07/29/2012    Past Surgical History:  Procedure Laterality Date   ANTERIOR CERVICAL DECOMPRESSION/DISCECTOMY FUSION 4 LEVELS N/A 01/08/2020   Procedure: ANTERIOR CERVICAL DECOMPRESSION FUSION CERVICAL 3-4, CERVICAL 4-5, CERVIAL 5-6 WITH INSTRUMENTATION AND ALLOGRAFT;  Surgeon: DPhylliss Bob MD;  [facility]  Service: Orthopedics;  Laterality: N/A;   BALLOON DILATION N/A 03/12/2019   Procedure: BALLOON DILATION;  Surgeon: MRush Landmark GTelford Nab, MD;  [facility];  Service: Gastroenterology;  Laterality: N/A;  pyloric   BIOPSY  03/08/2019   Procedure: BIOPSY;  Surgeon: AYetta Flock MD;  [facility];  Service: Gastroenterology;;   BIOPSY  03/12/2019   Procedure: BIOPSY;  Surgeon: MIrving Copas, MD;  [facility];  Service: Gastroenterology;;   CATARACT EXTRACTION     x 2   COLONOSCOPY     ENDOSCOPIC RETROGRADE CHOLANGIOPANCREATOGRAPHY (ERCP) WITH PROPOFOL N/A 09/09/2020   Procedure: ENDOSCOPIC RETROGRADE CHOLANGIOPANCREATOGRAPHY (ERCP) WITH PROPOFOL;  Surgeon: MIrving Copas, MD;  [facility];  Service: Gastroenterology;  Laterality: N/A;   ESOPHAGOGASTRODUODENOSCOPY N/A 03/08/2019   Procedure: ESOPHAGOGASTRODUODENOSCOPY (EGD);  Surgeon: AYetta Flock MD;  [facility];  Service: Gastroenterology;  Laterality: N/A;   ESOPHAGOGASTRODUODENOSCOPY (EGD) WITH PROPOFOL N/A 03/12/2019   Procedure: ESOPHAGOGASTRODUODENOSCOPY (EGD) WITH PROPOFOL;  Surgeon: MRush LandmarkGTelford Nab, MD;  [facility];  Service: Gastroenterology;  Laterality: N/A;   FINE NEEDLE ASPIRATION  03/12/2019   Procedure: FINE NEEDLE ASPIRATION (FNA) LINEAR;  Surgeon: MIrving Copas, MD;  [facility];  Service: Gastroenterology;;   FOREIGN BODY REMOVAL  03/08/2019   Procedure: FOREIGN BODY REMOVAL;  Surgeon: AYetta Flock MD;  [facility];  Service: Gastroenterology;;   IR BILIARY DRAIN PLACEMENT WITH CHOLANGIOGRAM  09/09/2020   IR INT EXT BILIARY DRAIN WITH CHOLANGIOGRAM  09/10/2020   IR PATIENT EVAL TECH 0-60 MINS  04/03/2019   LAPAROTOMY N/A 03/19/2019  Procedure: EXPLORATORY LAPAROTOMY, distal GASTRECTOMY AND PLACEMENT OF G AND J TUBE, gastric jejunostomy;  Surgeon: Greer Pickerel, MD;  [facility];  Service: General;  Laterality: N/A;   POLYPECTOMY     PORTACATH PLACEMENT N/A 03/26/2019   Procedure: INSERTION PORT-A-CATH;   Surgeon: Michael Boston, MD;  [facility];  Service: General;  Laterality: N/A;   [place] INJECTION  09/09/2020   Procedure: SUBMUCOSAL TATTOO INJECTION;  Surgeon: Irving Copas., MD;  [facility];  Service: Gastroenterology;;   UPPER ESOPHAGEAL ENDOSCOPIC ULTRASOUND (EUS) N/A 03/12/2019   Procedure: UPPER ESOPHAGEAL ENDOSCOPIC ULTRASOUND (EUS);  Surgeon: Irving Copas., MD;  [facility];  Service: Gastroenterology;  Laterality: N/A;   UPPER GASTROINTESTINAL ENDOSCOPY     VIDEO BRONCHOSCOPY WITH ENDOBRONCHIAL ULTRASOUND N/A 05/22/2019   Procedure: VIDEO BRONCHOSCOPY WITH ENDOBRONCHIAL ULTRASOUND;  Surgeon: Garner Nash, DO;  [facility];  Service: Thoracic;  Laterality: N/A;    Allergies: Patient has no known allergies.  Medications:  Current Facility-Administered Medications:    0.9 %  sodium chloride infusion, , Intravenous, Continuous, Wouk, Ailene Rud, MD, Last Rate: 125 mL/hr at 09/22/20 1334, New Bag at 09/22/20 1334   ceFEPIme (MAXIPIME) 2 g in sodium chloride 0.9 % 100 mL IVPB, 2 g, Intravenous, Q8H, Wouk, Ailene Rud, MD, Last Rate: 200 mL/hr at 09/22/20 1335, 2 g at 09/22/20 1335   chlorhexidine (PERIDEX) 0.12 % solution 15 mL, 15 mL, Mouth Rinse, BID, Wouk, Ailene Rud, MD, 15 mL at 09/22/20 2440   Chlorhexidine Gluconate Cloth 2 % PADS 6 each, 6 each, Topical, Daily, Wouk, Ailene Rud, MD, 6 each at 09/22/20 0927   dorzolamide-timolol (COSOPT) 22.3-6.8 MG/ML ophthalmic solution 1 drop, 1 drop, Both Eyes, BID, Wouk, Ailene Rud, MD, 1 drop at 09/22/20 0957   latanoprost (XALATAN) 0.005 % ophthalmic solution 1 drop, 1 drop, Both Eyes, QHS, Wouk, Ailene Rud, MD, 1 drop at 09/21/20 2200   MEDLINE mouth rinse, 15 mL, Mouth Rinse, q12n4p, Wouk, Ailene Rud, MD, 15 mL at 09/22/20 1143   norepinephrine (LEVOPHED) 22m in 2547mpremix infusion, 0-40 mcg/min, Intravenous, Titrated, Wouk, NoAilene RudMD,  Stopped at 09/22/20 [phone]  Family History  Problem Relation Age of Onset   Cancer Mother    Hypertension Mother    Diabetes Sister    Cancer Brother        colon   Hypertension Brother    Hyperlipidemia Brother    Stroke Brother    Hypertension Sister    Colon cancer Neg Hx    Heart attack Neg Hx    Sudden death Neg Hx    Esophageal cancer Neg Hx    Pancreatic cancer Neg Hx    Stomach cancer Neg Hx    Rectal cancer Neg Hx     Social History   Socioeconomic History   Marital status: Married    Spouse name: Not on file   Number of children: 7   Years of education: 7   Highest education level: Not on file  Occupational History   Occupation: paMining engineerRETIRED  Tobacco Use   Smoking status: Former    Packs/day: 1.00    Years: 24.00    Pack years: 24.00    Types: Cigarettes    Quit date: 10/03/1978    Years since quitting: 42.0   Smokeless tobacco: Never  Vaping Use   Vaping Use: Never used  Substance and Sexual Activity  Alcohol use: Not Currently   Drug use: No   Sexual activity: Yes    Partners: Female  Other Topics Concern   Not on file  Social History Narrative   9th grade.  Married '60. [name] 3 sons -'20 '64, '66; 4 dtrs -'60, '62, '61, '65; 15 grands, 25 great-grands.   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   ACD- discussed living will and HCPOA (July '14) provided packet.          Social Determinants of Health   Financial Resource Strain: Not on file  Food Insecurity: Not on file  Transportation Needs: Not on file  Physical Activity: Not on file  Stress: Not on file  Social Connections: Not on file    Review of Systems: A 12 point ROS discussed and pertinent positives are indicated in the HPI above.  All other systems are negative.  Review of Systems  Vital Signs: BP 118/74   Pulse 65   Temp (!) 97.4 F (36.3 C) (Axillary)   Resp 18   SpO2 100%   Physical Exam Constitutional:      Appearance: Normal  appearance. He is not ill-appearing or toxic-appearing.  HENT:     Mouth/Throat:     Mouth: Mucous membranes are moist.     Pharynx: Oropharynx is clear.  Cardiovascular:     Rate and Rhythm: Normal rate and regular rhythm.     Heart sounds: Normal heart sounds.  Pulmonary:     Effort: Pulmonary effort is normal. No respiratory distress.     Breath sounds: Normal breath sounds.  Abdominal:     General: There is no distension.     Palpations: Abdomen is soft. There is no mass.     Tenderness: There is no abdominal tenderness. There is no guarding.     Comments: Drain intact, site clean, NT Hazy bile in bag  Skin:    General: Skin is warm and dry.     Coloration: Skin is not jaundiced.     Comments: (R)chest port site clean, no swelling or erythema, NT  Neurological:     General: No focal deficit present.     Mental Status: He is alert and oriented to person, place, and time.  Psychiatric:        Mood and Affect: Mood normal.        Thought Content: Thought content normal.        Judgment: Judgment normal.    Imaging: DG Chest 2 View  Result Date: 09/21/2020 CLINICAL DATA:  Weakness and fever for 2 weeks. EXAM: CHEST - 2 VIEW COMPARISON:  03/26/2019 FINDINGS: The cardiomediastinal silhouette is unremarkable. Mild peribronchial thickening again noted. A RIGHT Port-A-Cath is again noted with tip overlying the SUPERIOR cavoatrial junction. There is no evidence of focal airspace disease, pulmonary edema, suspicious pulmonary nodule/mass, pleural effusion, or pneumothorax. No acute bony abnormalities are identified. IMPRESSION: No active cardiopulmonary disease. Electronically Signed   By: Margarette Canada M.D.   On: 09/21/2020 12:23   CT ABDOMEN PELVIS W CONTRAST  Result Date: 09/11/2020 CLINICAL DATA:  History of gastric cancer, now with elevated bilirubin post failed attempt at endoscopic biliary stent placement as well as percutaneous biliary drainage catheter placement. Please perform  abdominal CT for potential repeat attempt at internal/external biliary drainage catheter placement. EXAM: CT ABDOMEN AND PELVIS WITH CONTRAST TECHNIQUE: Multidetector CT imaging of the abdomen and pelvis was performed using the standard protocol following bolus administration of intravenous contrast. CONTRAST:  169m  OMNIPAQUE IOHEXOL 300 MG/ML  SOLN COMPARISON:  MRCP-09/04/2020; ERCP-09/09/2020; PET-CT-06/25/2020 FINDINGS: Lower chest: Limited visualization of the lower thorax demonstrates minimal dependent subpleural ground-glass atelectasis, right greater than left. There is a punctate granuloma within the right middle lobe. No discrete focal airspace opacities. Borderline cardiomegaly. No pericardial effusion. Port a catheter tip terminates within the superior cavoatrial junction. Hepatobiliary: Redemonstrated marked distension of the gallbladder with very minimal intrahepatic biliary ductal dilatation, similar to abdominal MRCP performed 09/04/2020. No definitive gallbladder wall thickening or pericholecystic stranding. Scattered foci air at the level of the porta hepatis and minimal amount of ill-defined stranding adjacent to the anterior aspect the left lobe of the liver and right lateral body wall, the result recent attempted percutaneous biliary drainage catheter placement. Approximately 1.2 cm nodule adjacent to the anterior aspect the lobe liver image 11, series 7 similar to recent abdominal MRI and again favored to represent a hepatic capsular implant. There is a punctate (approximately 0.6 cm) hypoattenuating lesion with the dome of the right lobe of the liver (image 11, series 2, which is too small to accurately characterize favored to represent a hepatic cyst. Additional subcentimeter hypoattenuating hepatic lesions (images 21 and 31, series 2) too small to accurately characterize remain indeterminate. Pancreas: Previously question hypovascular involving the pancreatic body on recent abdominal MRI is  not well demonstrated the present examination. No definitive downstream pancreatic ductal dilatation or peripancreatic stranding. Spleen: Normal appearance of the spleen. Adrenals/Urinary Tract: There is symmetric enhancement and excretion of the bilateral kidneys. There is a punctate (approximately 5 mm) nonobstructing stone within the inferior pole the right kidney image 34, series 2). No discrete left-sided renal stones on this postcontrast examination. No renal stones are seen along expected course of either ureter or the urinary bladder. Mild thickening of the urinary bladder wall, potentially accentuated due to underdistention. Redemonstrated small left-sided extrarenal pelvis. No evidence of urinary obstruction. Normal appearance the bilateral adrenal glands. Stomach/Bowel: Stable postoperative change the stomach compatible with history partial gastric resection. Moderate colonic stool burden without evidence of enteric obstruction. Scattered colonic diverticulosis without evidence of superimposed acute diverticulitis. Normal appearance of the terminal ileum and the retrocecal appendix. No discrete areas of bowel wall thickening. No pneumoperitoneum, pneumatosis or portal venous gas. Vascular/Lymphatic: Normal caliber of the aorta. There is a very minimal amount of involving the bilateral common iliac and femoral arteries, not resulting in hemodynamically significant stenosis. The major branch vessels the aorta appear patent on this non CTA examination. No bulky retroperitoneal, pelvic or inguinal lymphadenopathy. Reproductive: Dystrophic calcifications within a borderline enlarged prostate gland. No free fluid the pelvic cul-de-sac. Other: Redemonstrated scattered peritoneal nodules with dominant nodule within the right upper abdominal quadrant measuring 1.4 x 1.1 cm (image 38, series 2). Additional peritoneal nodules are seen on images 27, 38, 43, 46 and 47, series 2. Musculoskeletal: No acute or aggressive  osseous abnormalities. Redemonstrated bilateral L5 pars defects associated anterolisthesis. Mild-to-moderate multilevel lumbar spine DDD, worse at L4-L5 with disc space height loss, endplate irregularity and posterior directed osteophytosis. Mild degenerative change the hips with joint space loss, subchondral sclerosis and osteophytosis. No evidence. IMPRESSION: 1. Marked distension of the gallbladder with minimal intrahepatic biliary ductal dilatation, similar to MRCP performed 09/04/2020. 2. Redemonstrated suspected peritoneal metastatic disease including a subcapsular implant adjacent to the left lobe of the liver. 3. Previously questioned pancreatic head mass is not well demonstrated on the present examination though resolution should not assumed on the basis of this examination. 4. Similar appearing indeterminate  hepatic lesions at least one of which was found to be hypermetabolic on PET-CT performed 06/25/2020. 5. Solitary punctate (5 mm) nonobstructing right-sided renal stone. Electronically Signed   By: Sandi Mariscal M.D.   On: 09/11/2020 08:29   CT CHEST ABDOMEN PELVIS W CONTRAST  Result Date: 09/21/2020 CLINICAL DATA:  Abdominal pain, fever, weakness. Urinary tract infection. Gastrointestinal cancer. EXAM: CT CHEST, ABDOMEN, AND PELVIS WITH CONTRAST TECHNIQUE: Multidetector CT imaging of the chest, abdomen and pelvis was performed following the standard protocol during bolus administration of intravenous contrast. CONTRAST:  124m OMNIPAQUE IOHEXOL 300 MG/ML  SOLN COMPARISON:  CT abdomen pelvis 09/10/2020 and CT chest 06/12/2020. MR abdomen 09/04/2020 and PET 06/25/2020 FINDINGS: CT CHEST FINDINGS Cardiovascular: Right IJ Port-A-Cath terminates at the SVC RA junction. Heart is at the upper limits of normal in size. No pericardial effusion. Mediastinum/Nodes: Mediastinal lymph nodes measure up to 11 mm in the low right paratracheal station, similar. Calcified mediastinal lymph nodes. No hilar or axillary  adenopathy. Esophagus is grossly. Lungs/Pleura: Centrilobular and paraseptal emphysema. Calcified granulomas. Minimal dependent atelectasis bilaterally. No pleural fluid. Adherent debris in the right mainstem bronchus. Musculoskeletal: Degenerative changes in the spine. No worrisome lytic or sclerotic lesions. CT ABDOMEN PELVIS FINDINGS Hepatobiliary: Similar biliary ductal dilatation with a percutaneous biliary drainage catheter in place, terminating in the duodenum. Associated centrally obstructing mass, better seen on 09/04/2020. 9 mm low-attenuation lesion in the periphery of segment 4 (2/65), unchanged and too small to characterize. Other subcapsular metastases seen on 09/03/2020 are not readily appreciated. Gallbladder is dilated. Pancreas: Mass seen in the pancreatic body on 09/04/2020 is poorly appreciated. Spleen: Negative. Adrenals/Urinary Tract: Adrenal glands are unremarkable. Right renal stone. Kidneys are otherwise unremarkable. Ureters are decompressed. Bladder may be minimally thick-walled. Stomach/Bowel: Distal gastrectomy. Stomach, small bowel and appendix are otherwise unremarkable. Fluid seen in the colon. Vascular/Lymphatic: Vascular structures are unremarkable. No pathologically enlarged lymph nodes. Reproductive: Prostate is visualized. Other: Bilateral omental nodules measure up to 1.4 cm, as on 09/10/2020. Mesenteric nodules measure up to 8 mm in short axis (2/86). No free fluid. Left inguinal hernia contains fat. Musculoskeletal: Degenerative changes in the spine. IMPRESSION: 1. No findings to explain the patient's acute clinical history. 2. Peritoneal/omental metastases, as on 09/10/2020. 3. Percutaneous biliary drain in place with similar gallbladder and biliary ductal dilatation. Associated obstructing mass seen on 09/04/2020 is better seen on that study. 4. Pancreatic body mass, also better seen on 09/04/2020. 5. Right renal stone. Electronically Signed   By: MLorin PicketM.D.   On:  09/21/2020 14:47   MR 3D Recon At Scanner  Result Date: 09/04/2020 CLINICAL DATA:  New onset jaundice.  Metastatic gastric carcinoma. EXAM: MRI ABDOMEN WITHOUT AND WITH CONTRAST (INCLUDING MRCP) TECHNIQUE: Multiplanar multisequence MR imaging of the abdomen was performed both before and after the administration of intravenous contrast. Heavily T2-weighted images of the biliary and pancreatic ducts were obtained, and three-dimensional MRCP images were rendered by post processing. CONTRAST:  970mGADAVIST GADOBUTROL 1 MMOL/ML IV SOLN COMPARISON:  CT on 06/12/2020 FINDINGS: Lower chest: No acute findings. Hepatobiliary: No intrahepatic masses are seen, however there are a few sub-cm enhancing soft tissue nodules along the capsular surface of the left hepatic lobe consistent with peritoneal metastases. The gallbladder is distended and diffuse intra and extra hepatic biliary ductal dilatation is now seen, with common bile duct measuring 9 mm. Obstruction of the common bile duct is seen in the porta hepatis, due to an irregular soft tissue mass measuring approximately  4 x 3 cm on image 45/41. This most likely represents metastatic lymphadenopathy. Pancreas: A subtle hypovascular mass is seen in the pancreatic body which measures 2.3 x 1.9 cm on image 47/27. This may represent a pancreatic metastasis or adjacent peripancreatic lymphadenopathy. Spleen:  Within normal limits in size and appearance. Adrenals/Urinary Tract: No masses identified. No evidence of hydronephrosis. Stomach/Bowel: Prior gastric jejunostomy again noted. Otherwise unremarkable. Vascular/Lymphatic: See above findings in hepatobiliary and pancreas sections. Other: Multiple small enhancing peritoneal and omental soft tissue nodules are again seen in the anterior right and left upper quadrants, consistent with peritoneal metastases. Musculoskeletal:  No suspicious bone lesions identified. IMPRESSION: New diffuse biliary ductal dilatation due to soft  tissue mass in the porta hepatis adjacent to pancreatic head. This most likely represents metastatic lymphadenopathy. Small mass in the pancreatic body, most likely representing pancreatic metastasis or adjacent peripancreatic lymphadenopathy. Multiple sub-cm enhancing soft tissue nodules along the capsular surface of the left hepatic lobe and bilateral upper quadrant omental fat, consistent with peritoneal metastases. Electronically Signed   By: Marlaine Hind M.D.   On: 09/04/2020 21:06   DG ERCP  Result Date: 09/09/2020 CLINICAL DATA:  Attempted ERCP EXAM: ERCP TECHNIQUE: Multiple spot images obtained with the fluoroscopic device and submitted for interpretation post-procedure. FLUOROSCOPY TIME:  Fluoroscopy Time:  3 minutes 38 seconds Number of Acquired Spot Images: 0 COMPARISON:  MRCP 09/04/2020 FINDINGS: A total of 20 intraoperative saved images are submitted for review. The images demonstrate a flexible duodenal scope in various positions within the duodenum and proximal jejunum. No evidence of immediate complication. IMPRESSION: Attempted ERCP. These images were submitted for radiologic interpretation only. Please see the procedural report for the amount of contrast and the fluoroscopy time utilized. Electronically Signed   By: Jacqulynn Cadet M.D.   On: 09/09/2020 14:47   MR ABDOMEN MRCP W WO CONTAST  Result Date: 09/04/2020 CLINICAL DATA:  New onset jaundice.  Metastatic gastric carcinoma. EXAM: MRI ABDOMEN WITHOUT AND WITH CONTRAST (INCLUDING MRCP) TECHNIQUE: Multiplanar multisequence MR imaging of the abdomen was performed both before and after the administration of intravenous contrast. Heavily T2-weighted images of the biliary and pancreatic ducts were obtained, and three-dimensional MRCP images were rendered by post processing. CONTRAST:  51m GADAVIST GADOBUTROL 1 MMOL/ML IV SOLN COMPARISON:  CT on 06/12/2020 FINDINGS: Lower chest: No acute findings. Hepatobiliary: No intrahepatic masses are  seen, however there are a few sub-cm enhancing soft tissue nodules along the capsular surface of the left hepatic lobe consistent with peritoneal metastases. The gallbladder is distended and diffuse intra and extra hepatic biliary ductal dilatation is now seen, with common bile duct measuring 9 mm. Obstruction of the common bile duct is seen in the porta hepatis, due to an irregular soft tissue mass measuring approximately 4 x 3 cm on image 45/41. This most likely represents metastatic lymphadenopathy. Pancreas: A subtle hypovascular mass is seen in the pancreatic body which measures 2.3 x 1.9 cm on image 47/27. This may represent a pancreatic metastasis or adjacent peripancreatic lymphadenopathy. Spleen:  Within normal limits in size and appearance. Adrenals/Urinary Tract: No masses identified. No evidence of hydronephrosis. Stomach/Bowel: Prior gastric jejunostomy again noted. Otherwise unremarkable. Vascular/Lymphatic: See above findings in hepatobiliary and pancreas sections. Other: Multiple small enhancing peritoneal and omental soft tissue nodules are again seen in the anterior right and left upper quadrants, consistent with peritoneal metastases. Musculoskeletal:  No suspicious bone lesions identified. IMPRESSION: New diffuse biliary ductal dilatation due to soft tissue mass in the porta  hepatis adjacent to pancreatic head. This most likely represents metastatic lymphadenopathy. Small mass in the pancreatic body, most likely representing pancreatic metastasis or adjacent peripancreatic lymphadenopathy. Multiple sub-cm enhancing soft tissue nodules along the capsular surface of the left hepatic lobe and bilateral upper quadrant omental fat, consistent with peritoneal metastases. Electronically Signed   By: Marlaine Hind M.D.   On: 09/04/2020 21:06   IR BILIARY DRAIN PLACEMENT WITH CHOLANGIOGRAM  Result Date: 09/10/2020 INDICATION: History of gastric cancer, now with obstructive jaundice, post failed attempt  at ERCP biliary stent placement. EXAM: ATTEMPTED THOUGH ULTIMATELY UNSUCCESSFUL ULTRASOUND AND FLUOROSCOPIC GUIDED PERCUTANEOUS TRANSHEPATIC CHOLANGIOGRAM AND BILIARY TUBE PLACEMENT COMPARISON:  MRCP-09/04/2020; PET-CT-06/26/2018 MEDICATIONS: Cefoxitin 2 gm IV; The antibiotic was administered with an appropriate time frame prior to the initiation of the procedure CONTRAST:  45 mL OMNIPAQUE IOHEXOL 300 MG/ML  SOLN ANESTHESIA/SEDATION: Moderate (conscious) sedation was employed during this procedure. A total of Versed 3 mg and Fentanyl 100 mcg was administered intravenously. Moderate Sedation Time: 88 minutes. The patient's level of consciousness and vital signs were monitored continuously by radiology nursing throughout the procedure under my direct supervision. FLUOROSCOPY TIME:  10 minutes, 54 seconds (466 mGy) COMPLICATIONS: None immediate. TECHNIQUE: Informed written consent was obtained from the patient after a discussion of the risks, benefits and alternatives to treatment. Questions regarding the procedure were encouraged and answered. A timeout was performed prior to the initiation of the procedure. The right upper abdominal quadrant was prepped and draped in the usual sterile fashion, and a sterile drape was applied covering the operative field. Maximum barrier sterile technique with sterile gowns and gloves were used for the procedure. A timeout was performed prior to the initiation of the procedure. Sonographic evaluation was performed the liver demonstrating only a small amount of peripheral and central intrahepatic biliary duct dilatation with marked distension of gallbladder. Prolonged efforts were made to cannulate a peripherally located bile duct within both the right and left lobes of the liver however despite prolonged effort, a bile duct was unable to be successfully cannulated catheterized either with ultrasound or fluoroscopic guidance secondary to lack of biliary ductal dilatation as well as  poor percutaneous window due to interposition the ribs and the costal cartilage. As such the procedure was terminated. Dressings were applied. The patient tolerated the procedure well without immediate postprocedural complication. FINDINGS: Sonographic evaluation demonstrates lack of significant dilatation of either the peripheral or central aspect of the intrahepatic biliary tree and despite prolonged efforts, a bile duct was unable to be successfully opacified and/or cannulated. IMPRESSION: Attempted though ultimately unsuccessful percutaneous cholangiogram and biliary drain placement due to a combination lack significant dilatation the central of peripheral intrahepatic biliary system as well as poor percutaneous window due to interposition of ribs and the costal cartilage. PLAN: Will recheck patient's bilirubin level tomorrow and will discuss with the providing service the appropriateness of proceeding with repeat attempt versus cholecystostomy tube placement as indicated. Electronically Signed   By: Sandi Mariscal M.D.   On: 09/10/2020 09:57   IR INT EXT BILIARY DRAIN WITH CHOLANGIOGRAM  Result Date: 09/10/2020 INDICATION: History of gastric cancer, now with obstructive jaundice, post failed attempt at ERCP biliary stent placement (due to surgical anatomy) as well as failed attempt percutaneous biliary drainage placement yesterday. As such, patient presents today for repeat attempt at internal/external biliary drainage catheter placement EXAM: ULTRASOUND AND FLUOROSCOPIC GUIDED PERCUTANEOUS TRANSHEPATIC CHOLANGIOGRAM AND BILIARY TUBE PLACEMENT COMPARISON:  Attempted internal/external biliary drainage catheter placement-09/09/2020 CT abdomen and pelvis-earlier same  day MRCP-09/04/2020 PET-CT-06/25/2020 MEDICATIONS: Cefoxitin 2 gm IV; The antibiotic was administered with an appropriate time frame prior to the initiation of the procedure CONTRAST:  78m OMNIPAQUE IOHEXOL 300 MG/ML SOLN - administered into the  biliary tree. ANESTHESIA/SEDATION: Moderate (conscious) sedation was employed during this procedure. A total of Versed 2 mg and Fentanyl 100 mcg was administered intravenously. Moderate Sedation Time: 28 minutes. The patient's level of consciousness and vital signs were monitored continuously by radiology nursing throughout the procedure under my direct supervision. FLUOROSCOPY TIME:  4 minutes, 42 seconds (1633mGy close COMPLICATIONS: None immediate. TECHNIQUE: Informed written consent was obtained from the patient after a discussion of the risks, benefits and alternatives to treatment. Questions regarding the procedure were encouraged and answered. A timeout was performed prior to the initiation of the procedure. The right upper abdominal quadrant was prepped and draped in the usual sterile fashion, and a sterile drape was applied covering the operative field. Maximum barrier sterile technique with sterile gowns and gloves were used for the procedure. A timeout was performed prior to the initiation of the procedure. Exhaustive sonographic evaluation performed of the liver by the dictating interventional radiologist ultimately delineating a mildly dilated duct within the peripheral aspect the left lobe of the liver. After the overlying soft tissues were anesthetized with 1% Lidocaine with epinephrine, under direct ultrasound guidance, a 22 gauge Chiba needle was utilized to cannulate the peripheral aspect of the targeted left intrahepatic biliary duct. Appropriate position was confirmed with limited contrast injection. Next, the duct was cannulated with a Nitrex wire and dilated with an Accustick set under fluoroscopic guidance. Limited cholangiograms were performed in various obliquities confirming appropriate access. Nitrex wire was advanced to the level of the duodenum and exchanged over a 4 French angled glide catheter for a Amplatz wire. Under intermittent fluoroscopic guidance the track was dilated ultimately  allowing placement of a 10 [place] drainage catheter with coil ultimately locked within the duodenum. Contrast was injected and a completion radiographs were obtained in various obliquities. The catheter was connected to a drainage bag which yielded the brisk return of clear bile. The catheter was secured to the skin with an interrupted suture and StatLock device. Dressings were applied. The patient tolerated the procedure well without immediate postprocedural complication. FINDINGS: Exhaustive sonographic evaluation performed by the dictating interventional radiologist ultimately demonstrated mildly dilated duct within the peripheral aspect of the left lobe of the liver which was targeted under direct ultrasound guidance, ultimately allowing fluoroscopic guided placement of a 10 French percutaneous drainage catheter with end ultimately coiled and locked within the duodenum and radiopaque side marker located proximal to the level of the biliary hilum. Contrast injections demonstrate malignant narrowing/subtotal occlusion involving the mid and distal aspects of the CBD just caudal to the take-off of the cystic duct as was demonstrated on preceding MRCP. Biliary drainage catheter demonstrates opacification both the right and left intrahepatic biliary trees. IMPRESSION: Successful placement of a 10.2 French percutaneous biliary drainage catheter via left-sided approach with end coiled and locked within the duodenum. PLAN: - Recommend obtaining daily CMPs while the patient is admitted to the hospital. - Maintain the percutaneous biliary drainage catheter to external drainage until the patient's bilirubin has reached a nadir. - Once the patient's bilirubin has reached a nadir, the patient may return for definitive percutaneous cholangiogram and potential initiation of a capping trial as indicated. Note, ultimately, the patient may be a candidate for biliary stent placement as indicated. Electronically Signed   By:  Sandi Mariscal M.D.   On: 09/10/2020 17:12   US ABDOMEN LIMITED RUQ (LIVER/GB)  Result Date: 09/21/2020 CLINICAL DATA:  Fever EXAM: ULTRASOUND ABDOMEN LIMITED RIGHT UPPER QUADRANT COMPARISON:  None. FINDINGS: Gallbladder: No gallstones. Gallbladder wall edema. Minor pericholecystic fluid. No sonographic Murphy sign noted by sonographer. Common bile duct: Diameter: Not well visualized, measuring 6 mm. Echogenic material may reflect sludge. A percutaneous biliary drain is noted on the prior CT. Liver: No focal lesion identified. Intrahepatic duct dilatation. Within normal limits in parenchymal echogenicity. Portal vein is patent on color Doppler imaging with normal direction of blood flow towards the liver. Other: None. IMPRESSION: Nonspecific gallbladder wall edema. No sonographic evidence of acute cholecystitis. Intrahepatic duct dilatation. Common bile duct containing echogenic material that may reflect sludge. Electronically Signed   By: Macy Mis M.D.   On: 09/21/2020 20:35    Labs:  CBC: Recent Labs    09/12/20 0513 09/14/20 1200 09/21/20 1126 09/22/20 0414  WBC 7.4 5.6 12.7* 10.0  HGB 11.3* 10.9* 9.7* 9.2*  HCT 34.0* 32.9* 28.4* 26.9*  PLT 184 197 155 105*    COAGS: Recent Labs    01/06/20 0915 09/09/20 1900 09/21/20 1126 09/22/20 0414  INR 1.0 1.0 1.6* 1.8*  APTT 29  --  35  --     BMP: Recent Labs    10/30/19 0900 11/20/19 0900 12/11/19 1000 12/31/19 1056 02/18/20 1025 09/14/20 1200 09/21/20 1126 09/21/20 2020 09/22/20 0414  NA 141 139 141 140   < > 131* 123* 130* 132*  K 3.5 4.0 4.0 4.0   < > 3.8 3.5 3.4* 3.6  CL 108 104 107 106   < > 97* 94* 105 108  CO2 _0 < > 26 19* 20* 21*  GLUCOSE 173* 103* 105* 127*   < > 188* 152* 130* 151*  BUN _1 < > _2 CALCIUM 9.2 9.8 9.4 9.4   < > 9.2 7.8* 7.3* 7.2*  CREATININE 0.76 0.90 0.83 0.87   < > 0.78 0.96 0.96 0.81  GFRNONAA >60 >60 >60 >60   < > >60 >60 >60 >60  GFRAA >60 >60 >60 >60   --   --   --   --   --    < > = values in this interval not displayed.    LIVER FUNCTION TESTS: Recent Labs    09/12/20 0513 09/14/20 1200 09/21/20 1126 09/22/20 0414  BILITOT 5.0* 4.7* 3.6* 3.3*  AST 117* 67* 147* 103*  ALT 181* 119* 95* 74*  ALKPHOS 336* 301* 189* 165*  PROT 6.6 6.5 6.2* 5.1*  ALBUMIN 3.2* 3.7 2.6* 2.1*    TUMOR MARKERS: No results for input(s): AFPTM, CEA, CA199, CHROMGRNA in the last 8760 hours.  Assessment and Plan: Sepsis/bacteremia with Klebsiella + blood cx S/p I/E biliary drain 6/9 Overall lab trend is improved, Bili down to 3.3 WBC spiked to  12.7, now back down to 10 Reviewed imaging, and in addition to exam, bili drain is functioning well, no additional signs of cholecystitis. Port site shows no signs of infection at this time, query treating through bacteremia and salvaging port. Port removal can be considered if felt necessary. Otherwise, once infectious issues improved/resolved, can offer cholangiogram and consider drain capping trial. IR will continue following along  Thank you for this interesting consult.  I greatly enjoyed meeting GARION WEMPE and look forward to participating in their care.  A copy of this report was sent to the requesting provider on this date.  Electronically Signed: Ascencion Dike, PA-C 09/22/2020, 1:37 PM   I spent a total of 20 minutes in face to face in clinical consultation, greater than 50% of which was counseling/coordinating care for obstructive jaundice with biliary drain

## 2020-09-22 NOTE — Progress Notes (Signed)
[facility], [place] ADULT ICU REPLACEMENT PROTOCOL   The patient does apply for the [facility] Adult ICU Electrolyte Replacment Protocol based on the criteria listed below:   1. Is GFR >/= 30 ml/min? Yes.    Patient's GFR today is >60 2. Is SCr </= 2? No. Patient's SCr is 0.96 ml/kg/hr 3. Did SCr increase >/= 0.5 in 24 hours? No. 4. Abnormal electrolyte(s): k+ 3.4 5. Ordered repletion with: see standing orders 6. If a panic level lab has been reported, has the CCM MD in charge been notified? No..     Darlys Gales 09/22/2020 4:53 AM

## 2020-09-22 NOTE — Progress Notes (Signed)
[place] Progress Note Patient Name: Jonathan Lewis DOB: Sep 01, 1940 MRN: [id number]   Date of Service  09/22/2020  HPI/Events of Note  Notified of K 3.4 Creatinine 0.96  eICU Interventions  Initiate electrolyte replacement protocol     Intervention Category Intermediate Interventions: Electrolyte abnormality - evaluation and management  Judd Lien 09/22/2020, 4:31 AM

## 2020-09-22 NOTE — Consult Note (Signed)
Referral MD  Reason for Referral: Metastatic gastric cancer; biliary obstruction secondary to porta hepatis adenopathy; gram-negative rod bacteremia  No chief complaint on file. : I just felt very tired and weak.  HPI: Mr. Hedgepath is a very nice 80 year old African-American male.  He is a Theme [employer] manager.  He has metastatic adenocarcinoma of the stomach.  He unfortunate developed biliary obstruction recently.  He had a biliary drainage catheter placed by interventional radiology.  This is been draining well.  He has been scheduled to start radiation therapy for the porta hepatis adenopathy.  Over the weekend, he got weak.  He was not eating.  He got more confused.  His wife called our office.  We told her make sure she called EMS to have brought to the emergency room.  He still plan has been found to be positive for gram-negative rods in his blood.  I am sure this is probably coming from the biliary system.  He has not had chemotherapy for a while.  As such, I would think that his immune system should be relatively decent.  This morning, his white cell count is 10.  Hemoglobin 9.2.  Platelet count 105,000.  His bilirubin is 3.3.  Albumin is 2.1.  Calcium 7.2.  BUN is 17 creatinine 0.81.  Again, he has gram-negative rods from his blood cultures.  I had to leave that this is coming from his biliary system.  He does have a Port-A-Cath in place.  He has had no bleeding.  There has been a little bit of diarrhea.  He has had no vomiting.  He is on Maxipime/vancomycin/Flagyl.  He also is on some pressor support.  His blood pressure this morning is 124/70.  He sounds pretty good.  He is alert.  He is oriented.  I know that he is getting outstanding care from all staff down in the ICU.    Past Medical History:  Diagnosis Date   Acoustic neuroma ([place]) 06/25/2015   Allergic rhinitis 07/18/2014   Allergy    Anemia    Arthritis    BPH (benign prostatic hyperplasia)    Cancer ([place])    stage  III stomach-Dx 03/2019   Carbuncle    recurrent MRSA carbuncles   Cataract    Per pt bilateral cataracts removed.   Diabetes mellitus    Type II. Per pt Dr. Jenny Reichmann took him off his dm med.   Disk prolapse    Glaucoma    Hyperlipidemia    Hypertension    Male hypogonadism 07/16/2014   Neuropathy    Pneumonia    Sinus bradycardia    chronic, asymptomatic   Sinusitis 07/29/2012  :   Past Surgical History:  Procedure Laterality Date   ANTERIOR CERVICAL DECOMPRESSION/DISCECTOMY FUSION 4 LEVELS N/A 01/08/2020   Procedure: ANTERIOR CERVICAL DECOMPRESSION FUSION CERVICAL 3-4, CERVICAL 4-5, CERVIAL 5-6 WITH INSTRUMENTATION AND ALLOGRAFT;  Surgeon: Phylliss Bob, MD;  Location: [facility];  Service: Orthopedics;  Laterality: N/A;   BALLOON DILATION N/A 03/12/2019   Procedure: BALLOON DILATION;  Surgeon: Rush Landmark Telford Nab., MD;  Location: [facility];  Service: Gastroenterology;  Laterality: N/A;  pyloric   BIOPSY  03/08/2019   Procedure: BIOPSY;  Surgeon: Yetta Flock, MD;  Location: [facility];  Service: Gastroenterology;;   BIOPSY  03/12/2019   Procedure: BIOPSY;  Surgeon: Irving Copas., MD;  Location: [facility];  Service: Gastroenterology;;   CATARACT EXTRACTION     x 2   COLONOSCOPY     ENDOSCOPIC RETROGRADE CHOLANGIOPANCREATOGRAPHY (  ERCP) WITH PROPOFOL N/A 09/09/2020   Procedure: ENDOSCOPIC RETROGRADE CHOLANGIOPANCREATOGRAPHY (ERCP) WITH PROPOFOL;  Surgeon: Rush Landmark Telford Nab., MD;  Location: [facility];  Service: Gastroenterology;  Laterality: N/A;   ESOPHAGOGASTRODUODENOSCOPY N/A 03/08/2019   Procedure: ESOPHAGOGASTRODUODENOSCOPY (EGD);  Surgeon: Yetta Flock, MD;  Location: [facility];  Service: Gastroenterology;  Laterality: N/A;   ESOPHAGOGASTRODUODENOSCOPY (EGD) WITH PROPOFOL N/A 03/12/2019   Procedure: ESOPHAGOGASTRODUODENOSCOPY (EGD) WITH PROPOFOL;  Surgeon: Rush Landmark Telford Nab., MD;  Location: [facility];  Service: Gastroenterology;   Laterality: N/A;   FINE NEEDLE ASPIRATION  03/12/2019   Procedure: FINE NEEDLE ASPIRATION (FNA) LINEAR;  Surgeon: Irving Copas., MD;  Location: [facility];  Service: Gastroenterology;;   FOREIGN BODY REMOVAL  03/08/2019   Procedure: FOREIGN BODY REMOVAL;  Surgeon: Yetta Flock, MD;  Location: [facility];  Service: Gastroenterology;;   IR BILIARY DRAIN PLACEMENT WITH CHOLANGIOGRAM  09/09/2020   IR INT EXT BILIARY DRAIN WITH CHOLANGIOGRAM  09/10/2020   IR PATIENT EVAL TECH 0-60 MINS  04/03/2019   LAPAROTOMY N/A 03/19/2019   Procedure: EXPLORATORY LAPAROTOMY, distal GASTRECTOMY AND PLACEMENT OF G AND J TUBE, gastric jejunostomy;  Surgeon: Greer Pickerel, MD;  Location: [facility];  Service: General;  Laterality: N/A;   POLYPECTOMY     PORTACATH PLACEMENT N/A 03/26/2019   Procedure: INSERTION PORT-A-CATH;  Surgeon: Michael Boston, MD;  Location: [facility];  Service: General;  Laterality: N/A;   [place] INJECTION  09/09/2020   Procedure: SUBMUCOSAL TATTOO INJECTION;  Surgeon: Irving Copas., MD;  Location: [facility];  Service: Gastroenterology;;   UPPER ESOPHAGEAL ENDOSCOPIC ULTRASOUND (EUS) N/A 03/12/2019   Procedure: UPPER ESOPHAGEAL ENDOSCOPIC ULTRASOUND (EUS);  Surgeon: Irving Copas., MD;  Location: [facility];  Service: Gastroenterology;  Laterality: N/A;   UPPER GASTROINTESTINAL ENDOSCOPY     VIDEO BRONCHOSCOPY WITH ENDOBRONCHIAL ULTRASOUND N/A 05/22/2019   Procedure: VIDEO BRONCHOSCOPY WITH ENDOBRONCHIAL ULTRASOUND;  Surgeon: Garner Nash, DO;  Location: [facility];  Service: Thoracic;  Laterality: N/A;  :   Current Facility-Administered Medications:    0.9 %  sodium chloride infusion, , Intravenous, Continuous, Wouk, Ailene Rud, MD, Last Rate: 125 mL/hr at 09/22/20 0707, Infusion Verify at 09/22/20 0707   ceFEPIme (MAXIPIME) 2 g in sodium chloride 0.9 % 100 mL IVPB, 2 g, Intravenous, Q8H, Wouk, Ailene Rud, MD, Stopped at 09/22/20  0603   chlorhexidine (PERIDEX) 0.12 % solution 15 mL, 15 mL, Mouth Rinse, BID, Wouk, Ailene Rud, MD, 15 mL at 09/21/20 2207   Chlorhexidine Gluconate Cloth 2 % PADS 6 each, 6 each, Topical, Daily, Wouk, Ailene Rud, MD, 6 each at 09/21/20 1643   dorzolamide-timolol (COSOPT) 22.3-6.8 MG/ML ophthalmic solution 1 drop, 1 drop, Both Eyes, BID, Wouk, Ailene Rud, MD, 1 drop at 09/21/20 2200   latanoprost (XALATAN) 0.005 % ophthalmic solution 1 drop, 1 drop, Both Eyes, QHS, Wouk, Ailene Rud, MD, 1 drop at 09/21/20 2200   MEDLINE mouth rinse, 15 mL, Mouth Rinse, q12n4p, Wouk, Ailene Rud, MD, 15 mL at 09/21/20 1706   metroNIDAZOLE (FLAGYL) IVPB 500 mg, 500 mg, Intravenous, Q8H, Wouk, Ailene Rud, MD, Stopped at 09/22/20 0527   norepinephrine (LEVOPHED) 4mg  in 281mL premix infusion, 0-40 mcg/min, Intravenous, Titrated, Wouk, Ailene Rud, MD, Last Rate: 11.25 mL/hr at 09/22/20 0707, 3 mcg/min at 09/22/20 0707   potassium chloride 10 mEq in 50 mL *CENTRAL LINE* IVPB, 10 mEq, Intravenous, Q1 Hr x 4, [place], Emily T, MD, Last Rate: 50 mL/hr at 09/22/20 0707, Infusion Verify  at 09/22/20 0707   vancomycin (VANCOREADY) IVPB 1500 mg/300 mL, 1,500 mg, Intravenous, Q24H, Wouk, Ailene Rud, MD:   chlorhexidine  15 mL Mouth Rinse BID   Chlorhexidine Gluconate Cloth  6 each Topical Daily   dorzolamide-timolol  1 drop Both Eyes BID   latanoprost  1 drop Both Eyes QHS   mouth rinse  15 mL Mouth Rinse q12n4p  :  No Known Allergies:   Family History  Problem Relation Age of Onset   Cancer Mother    Hypertension Mother    Diabetes Sister    Cancer Brother        colon   Hypertension Brother    Hyperlipidemia Brother    Stroke Brother    Hypertension Sister    Colon cancer Neg Hx    Heart attack Neg Hx    Sudden death Neg Hx    Esophageal cancer Neg Hx    Pancreatic cancer Neg Hx    Stomach cancer Neg Hx    Rectal cancer Neg Hx   :   Social History   Socioeconomic History   Marital  status: Married    Spouse name: Not on file   Number of children: 7   Years of education: 7   Highest education level: Not on file  Occupational History   Occupation: Mining engineer: RETIRED  Tobacco Use   Smoking status: Former    Packs/day: 1.00    Years: 24.00    Pack years: 24.00    Types: Cigarettes    Quit date: 10/03/1978    Years since quitting: 42.0   Smokeless tobacco: Never  Vaping Use   Vaping Use: Never used  Substance and Sexual Activity   Alcohol use: Not Currently   Drug use: No   Sexual activity: Yes    Partners: Female  Other Topics Concern   Not on file  Social History Narrative   9th grade.  Married '60. [name] 3 sons -'21 '64, '66; 4 dtrs -'60, '62, '61, '65; 15 grands, 25 great-grands.   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   ACD- discussed living will and HCPOA (July '14) provided packet.          Social Determinants of Health   Financial Resource Strain: Not on file  Food Insecurity: Not on file  Transportation Needs: Not on file  Physical Activity: Not on file  Stress: Not on file  Social Connections: Not on file  Intimate Partner Violence: Not on file  :  Review of Systems  Constitutional:  Positive for chills, fever and malaise/fatigue.  HENT: Negative.    Eyes: Negative.   Respiratory: Negative.    Cardiovascular: Negative.   Gastrointestinal:  Positive for diarrhea and nausea.  Genitourinary: Negative.   Musculoskeletal: Negative.   Skin: Negative.   Neurological: Negative.   Endo/Heme/Allergies: Negative.   Psychiatric/Behavioral: Negative.      Exam:  Physical Exam Vitals reviewed.  HENT:     Head: Normocephalic and atraumatic.  Eyes:     Pupils: Pupils are equal, round, and reactive to light.  Cardiovascular:     Rate and Rhythm: Normal rate and regular rhythm.     Heart sounds: Normal heart sounds.  Pulmonary:     Effort: Pulmonary effort is normal.     Breath sounds: Normal breath sounds.   Abdominal:     General: Bowel sounds are normal.     Palpations: Abdomen is soft.     Comments: Abdominal  exam shows a biliary catheter in the anterior abdominal wall.  His abdomen is soft.  He has decreased bowel sounds.  There is no guarding or rebound tenderness.  There is no obvious abdominal mass.  There is no palpable hepatomegaly.  Musculoskeletal:        General: No tenderness or deformity. Normal range of motion.     Cervical back: Normal range of motion.  Lymphadenopathy:     Cervical: No cervical adenopathy.  Skin:    General: Skin is warm and dry.     Findings: No erythema or rash.  Neurological:     Mental Status: He is alert and oriented to person, place, and time.  Psychiatric:        Behavior: Behavior normal.        Thought Content: Thought content normal.        Judgment: Judgment normal.    Patient Vitals for the past 24 hrs:  BP Temp Temp src Pulse Resp SpO2  09/22/20 0700 127/74 -- -- 62 (!) 9 100 %  09/22/20 0400 106/64 (!) 97.4 F (36.3 C) Oral 72 16 100 %  09/22/20 0300 (!) 105/59 -- -- 64 11 98 %  09/22/20 0200 108/69 -- -- 65 17 100 %  09/22/20 0100 (!) 97/53 -- -- 64 14 93 %  09/22/20 0000 (!) 99/53 97.8 F (36.6 C) Oral 60 12 100 %  09/21/20 2100 (!) 81/60 -- -- 65 13 99 %  09/21/20 2000 (!) 98/57 (!) 97.4 F (36.3 C) Axillary 66 17 97 %  09/21/20 1900 106/64 -- -- 72 17 (!) 89 %  09/21/20 1800 (!) 70/49 -- -- 66 13 100 %  09/21/20 1730 (!) 83/44 -- -- 70 13 99 %  09/21/20 1700 (!) 80/50 -- -- 72 14 100 %  09/21/20 1646 (!) 84/55 97.7 F (36.5 C) Oral 74 17 100 %  09/21/20 1530 93/62 -- -- 72 16 99 %  09/21/20 1500 104/67 -- -- 76 14 100 %  09/21/20 1436 92/62 -- -- 76 15 100 %  09/21/20 1433 -- 97.8 F (36.6 C) Oral -- -- --  09/21/20 1433 -- -- -- 73 18 99 %  09/21/20 1330 92/61 -- -- 80 16 100 %  09/21/20 1230 (!) 99/58 -- -- 86 -- 98 %  09/21/20 1212 (!) 89/59 -- -- 86 16 97 %  09/21/20 1135 94/60 -- -- 99 (!) 24 97 %  09/21/20 1128  -- (!) 102 F (38.9 C) Oral -- -- --      Recent Labs    09/21/20 1126 09/22/20 0414  WBC 12.7* 10.0  HGB 9.7* 9.2*  HCT 28.4* 26.9*  PLT 155 105*    Recent Labs    09/21/20 2020 09/22/20 0414  NA 130* 132*  K 3.4* 3.6  CL 105 108  CO2 20* 21*  GLUCOSE 130* 151*  BUN 18 17  CREATININE 0.96 0.81  CALCIUM 7.3* 7.2*    Blood smear review: None  Pathology: None    Assessment and Plan: Mr. Guarisco is a really nice 80 year old African-American male.  He has metastatic adenocarcinoma of the stomach.  He has biliary obstruction secondary to a porta hepatis lymph node.  He has a biliary drainage catheter in.  He now is bacteremic.  His gram-negative rods.  Again, I have to believe that the source will be the biliary tree.  He is on broad-spectrum antibiotics.  His blood pressure is doing fine.  Hopefully he  will come off pressors today.  He was supposed to start radiation therapy yesterday.  I am unsure when he will be able to get started for his radiation.  I know this is a tough situation.  I just hate that he now has the bacteremia.  Again we will have to see what the culture shows.  I will think this bacteria might be E. coli or Klebsiella.  Hopefully it is not Pseudomonas.  His immune system should be okay.  He has not had chemotherapy for a while.  We will follow along.  We will have to see how his blood counts look.  He may have some thrombocytopenia secondary to the bacteremia.  I know that he will get outstanding care from all staff down the ICU.  There is a compassionate and so professional.  I know that they will do a wonderful job taking care of [employer].  Lattie Haw, MD  Proverbs 24:14

## 2020-09-22 NOTE — Progress Notes (Signed)
  Echocardiogram 2D Echocardiogram has been performed.  Fidel Levy 09/22/2020, 12:13 PM

## 2020-09-22 NOTE — Plan of Care (Signed)

## 2020-09-22 NOTE — Progress Notes (Signed)
NAME:  Jonathan Lewis, MRN:  [id number], DOB:  1940-11-02, LOS: 1 ADMISSION DATE:  09/21/2020, CONSULTATION DATE:  09/22/20 REFERRING MD:  TRH, CHIEF COMPLAINT:  lethargy   Brief Narrative  80yo male with PMH significant for stage III stomach cancer who presented with septic shock with encephalopathy and AKI concern for biliary source of infections   Required initiation of pressors due to persistent hypotension for which PCCM was consulted to manage  Pertinent  Medical History  Gastric cancer s/p resection 2020, now with mets on chemo Type 2 diabetes  HLD HTN  Significant Hospital Events:  6/19 admitted, hypotensive, presumed biliary source 6/21 started on peripheral pressors later evening of 6/20, transferred to ICU  Interim History / Subjective:  No acute events overnight  States he feels better and is requesting something to eat Family updated at bedside   Objective   Blood pressure 127/74, pulse 62, temperature (!) 97.4 F (36.3 C), temperature source Oral, resp. rate (!) 9, SpO2 100 %.        Intake/Output Summary (Last 24 hours) at 09/22/2020 0721 Last data filed at 09/22/2020 7836 Gross per 24 hour  Intake 6317.05 ml  Output 3000 ml  Net 3317.05 ml    There were no vitals filed for this visit.  Examination: General: Acute on chronically ill appearing deconditioned elderly male lying in bed, in NAD HEENT: ETT, MM pink/moist, PERRL,  Neuro: Alert and oriented x3, non-focal  CV: s1s2 regular rate and rhythm, no murmur, rubs, or gallops,  PULM:  Clear to ascultation bilaterally, no added breath sounds, oxygen saturations 100% GI: soft, bowel sounds active in all 4 quadrants, non-tender, non-distended Extremities: warm/dry, no edema  Skin: no rashes or lesions  Labs/imaging that I havepersonally reviewed   PTA Imaging: 6/09 CT ABD/pelvis > Marked distension of the gallbladder with minimal intrahepatic biliary ductal dilatation, similar to MRCP performed  09/04/2020. Redemonstrated suspected peritoneal metastatic disease including a subcapsular implant adjacent to the left lobe of the liver. Previously questioned pancreatic head mass is not well demonstrated on the present examination though resolution should not assumed on the basis of this examination. Similar appearing indeterminate hepatic lesions at least one of which was found to be hypermetabolic on PET-CT performed 06/25/2020. Solitary punctate (5 mm) nonobstructing right-sided renal stone. Current hospital imaging CXR 6/20 > Negative  6/20 > No findings to explain the patient's acute clinical history. Peritoneal/omental metastases, as on 09/10/2020. Percutaneous biliary drain in place with similar gallbladder and biliary ductal dilatation. Associated obstructing mass seen on 09/04/2020 is better seen on that study. Pancreatic body mass, also better seen on 09/04/2020. Right renal stone. 6/20 ABD [place] Nonspecific gallbladder wall edema. No sonographic evidence of acute cholecystitis. Intrahepatic duct dilatation. Common bile duct containing echogenic material that may reflect sludge.  Resolved Hospital Problem list   AKI  Assessment & Plan:  Septic shock, severe sepsis with gram negative bacteremia  -UA clear, CXR clear, on RA, no cough. LFTs improved but with persistent bilary ductal dilation on CT AP. Presumed biliary source. -Patient met criteria for sepsis on admission given fever with temp of 102, tachypnea with respiratory rate 24, acute encephalopathy with AMS, and acute suspicion for infection. P: Remains in ICU  Pan cultures prior to antibiotic Continue IV Cefepime, Flagyl, and Vanc Continue pressors for MAP goal > 65,   Monitor urine output Capillary refill: IR to evaluate possible bilary intervention   Toxic Metabolic Encephalopathy -Presumed due to sepsis. P: Maintain neuro protective  measures; goal for eurothermia, euglycemia, eunatermia, normoxia, and PCO2 goal of  35-40 Nutrition and bowel regiment  Seizure precautions  Aspirations precautions  Delirium precautions  Minimize sedation  Metastatic gastric cancer s/p distal gastrectomy with mets to the liver, omentum, and pancreatic body Biliary obstruction secondary to hepatic adenopathy   -Originally scheduled to start radiation 6/20 for hepatic adenopathy   Transaminates Hyperbilirubinemia  Coagulopathy  -INR 1.8 P: Primary management per Oncology  Supportive care Avoid hepatotoxins  Trend INR   Mild hyponatremia -Na 132 P: Trend Bmet Supplement   Best Practice   Diet/type: NPO Pain/Anxiety/Delirium protocol Not indicated VAP protocol (if indicated): Not indicated DVT prophylaxis: SCD GI prophylaxis: PPI Glucose control:  not indicated Central venous access:  Yes, and it is still needed Arterial line:  N/A Foley:  N/A Mobility:  OOB  PT consulted: Yes Studies pending: None Culture data pending:blood Last reviewed culture data:today Antibiotics:cefepime, vanc, and flagyl  Antibiotic de-escalation: no,  continue current rx Stop date: to be determined  Daily labs: ordered Code Status:  DNR Last date of multidisciplinary goals of care discussion Update family daily  ccm prognosis: Serious Disposition: remains critically ill, will stay in intensive care   Critical care time:    Performed by: Deagen Krass D. Harris  Total critical care time: 37 minutes  Critical care time was exclusive of separately billable procedures and treating other patients.  Critical care was necessary to treat or prevent imminent or life-threatening deterioration.  Critical care was time spent personally by me on the following activities: development of treatment plan with patient and/or surrogate as well as nursing, discussions with consultants, evaluation of patient's response to treatment, examination of patient, obtaining history from patient or surrogate, ordering and performing treatments and  interventions, ordering and review of laboratory studies, ordering and review of radiographic studies, pulse oximetry and re-evaluation of patient's condition.  Meriel Kelliher D. Kenton Kingfisher, NP-C  Pulmonary & Critical Care Personal contact information can be found on Amion  09/22/2020, 7:58 AM

## 2020-09-22 NOTE — Progress Notes (Addendum)
PROGRESS NOTE    [name]  GUY:[id number] DOB: Nov 19, 1940 DOA: 09/21/2020 PCP: Biagio Borg, MD  Outpatient Specialists: [facility]    Brief Narrative:   Jonathan Lewis is a 80 y.o. 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   Recent hospitalization for biliary obstruction 2/2 metastatic cancer. Unable to place biliary stent so IR percutaneous stent placed.   Patient presented to our ED 2 days ago for urinary cather bag replacement. Found to be febrile, prescribed keflex empirically for uti. Culture since returned negative.   Wife reports that patient has been fatigued for some weeks but for last couple of days much more fatigued, not interactive, not getting out of bed. No report of chest pain or cough. No report of vomiting or diarrhea.     Assessment & Plan:   Principal Problem:   SIRS (systemic inflammatory response syndrome) (HCC) Active Problems:   Diabetes (HCC)   Benign nodular prostatic hyperplasia   Gastric cancer pT3pN2 s/p distal gastrectomy with Billroth II gastrojejunostomy 03/19/2019   Sepsis ([place])  # Klebsiella bacteremia # Severe sepsis # Acute metabolic encephalopathy No acute findings on CT. Blood cultures growing esbl klebsiella, presume biliary source. Does have port-a cath. S/p aggressive fluid resuscitation and currently on levophed. Hemodynamics much improved, encephalopathy resolved, lactate normalized - continue ns @ 125 - continue to wean pressors - continue cefepime, await sensitivities - ID consult - TTE - will likely need d/c of port-a-cath  # Biliary obstruction Percutaneous drain is draining well. Some biliary congestion seen on ct/u/s. IR following - f/u IR recs - f/u drain culture  # Urinary obstruction - has foley placed at previous hospitalization   # Hyponatremia Likely 2/2 dehydration given reduced PO. Subacute, sodium  was 131 1 week ago, 123 on arrival, improved to 132 with fluid resuscitation - normal saline, trend   # Anemia Baseline hgb 10-11, here hgb 9.7, per chart record has had intermittent melena as of late. EGD unrevealing when performed last month - monitor - hold pharmacologic dvt ppx for now   # Adenocarcinoma - oncology aware, plan to start radiation therapy as soon as today   # T2DM Not on home meds. Here glucose mildly elevated - monitor daily fasting glucose   DVT prophylaxis: SCDs Code Status: DNR Family Communication: son updated @ bedside 6/21  Level of care: Stepdown Status is: Inpatient  Remains inpatient appropriate because:Inpatient level of care appropriate due to severity of illness  Dispo: The patient is from: Home              Anticipated d/c is to:  tbd              Patient currently is not medically stable to d/c.   Difficult to place patient No        Consultants:  Pccm, IR, oncology, ID  Procedures: none  Antimicrobials:  Vanc/cefepime/flagyl 6/20 Cefepime 6/20>   Subjective: This morning feeling much improved. Denies pain. Feels a bit weak  Objective: Vitals:   09/22/20 0800 09/22/20 0815 09/22/20 0830 09/22/20 0900  BP: (!) 153/88 (!) 139/95 (!) 120/106 116/82  Pulse: 77 64 67 62  Resp: (!) 0 12 14 12   Temp: (!) 97.4 F (36.3 C)     TempSrc: Oral     SpO2: 100% 100% 100% 100%    Intake/Output Summary (Last 24 hours) at 09/22/2020 5638 Last data filed  at 09/22/2020 0926 Gross per 24 hour  Intake 6725.93 ml  Output 3451 ml  Net 3274.93 ml   There were no vitals filed for this visit.  Examination:  General exam: Appears calm and comfortable  Respiratory system: rales @ bases Cardiovascular system: S1 & S2 heard, RRR. Soft systolic murmur Gastrointestinal system: Abdomen is mildly distended, soft and nontender. No organomegaly or masses felt. Normal bowel sounds heard. Biliary drain Central nervous system: Alert and oriented.  No focal neurological deficits. Extremities: Symmetric 5 x 5 power. Skin: No rashes, lesions or ulcers Psychiatry: Judgement and insight appear normal. Mood & affect appropriate.     Data Reviewed: I have personally reviewed following labs and imaging studies  CBC: Recent Labs  Lab 09/21/20 1126 09/22/20 0414  WBC 12.7* 10.0  NEUTROABS 10.2*  --   HGB 9.7* 9.2*  HCT 28.4* 26.9*  MCV 85.0 84.9  PLT 155 846*   Basic Metabolic Panel: Recent Labs  Lab 09/21/20 1126 09/21/20 2020 09/22/20 0414  NA 123* 130* 132*  K 3.5 3.4* 3.6  CL 94* 105 108  CO2 19* 20* 21*  GLUCOSE 152* 130* 151*  BUN 22 18 17   CREATININE 0.96 0.96 0.81  CALCIUM 7.8* 7.3* 7.2*   GFR: Estimated Creatinine Clearance: 69.2 mL/min (by C-G formula based on SCr of 0.81 mg/dL). Liver Function Tests: Recent Labs  Lab 09/21/20 1126 09/22/20 0414  AST 147* 103*  ALT 95* 74*  ALKPHOS 189* 165*  BILITOT 3.6* 3.3*  PROT 6.2* 5.1*  ALBUMIN 2.6* 2.1*   No results for input(s): LIPASE, AMYLASE in the last 168 hours. No results for input(s): AMMONIA in the last 168 hours. Coagulation Profile: Recent Labs  Lab 09/21/20 1126 09/22/20 0414  INR 1.6* 1.8*   Cardiac Enzymes: No results for input(s): CKTOTAL, CKMB, CKMBINDEX, TROPONINI in the last 168 hours. BNP (last 3 results) No results for input(s): PROBNP in the last 8760 hours. HbA1C: No results for input(s): HGBA1C in the last 72 hours. CBG: Recent Labs  Lab 09/22/20 0738  GLUCAP 97   Lipid Profile: No results for input(s): CHOL, HDL, LDLCALC, TRIG, CHOLHDL, LDLDIRECT in the last 72 hours. Thyroid Function Tests: No results for input(s): TSH, T4TOTAL, FREET4, T3FREE, THYROIDAB in the last 72 hours. Anemia Panel: No results for input(s): VITAMINB12, FOLATE, FERRITIN, TIBC, IRON, RETICCTPCT in the last 72 hours. Urine analysis:    Component Value Date/Time   COLORURINE AMBER (A) 09/21/2020 1445   APPEARANCEUR CLEAR 09/21/2020 1445    LABSPEC 1.020 09/21/2020 1445   PHURINE 5.0 09/21/2020 1445   GLUCOSEU NEGATIVE 09/21/2020 1445   GLUCOSEU NEGATIVE 04/22/2020 1246   HGBUR MODERATE (A) 09/21/2020 1445   HGBUR negative 11/30/2009 1450   BILIRUBINUR NEGATIVE 09/21/2020 1445   KETONESUR NEGATIVE 09/21/2020 1445   PROTEINUR NEGATIVE 09/21/2020 1445   UROBILINOGEN 0.2 04/22/2020 1246   NITRITE NEGATIVE 09/21/2020 1445   LEUKOCYTESUR NEGATIVE 09/21/2020 1445   Sepsis Labs: @LABRCNTIP (procalcitonin:4,lacticidven:4)  ) Recent Results (from the past 240 hour(s))  Urine culture     Status: None   Collection Time: 09/19/20  6:10 PM   Specimen: Urine, Clean Catch  Result Value Ref Range Status   Specimen Description   Final    URINE, CLEAN CATCH Performed at [facility], [place] [address] [place], [place] [address]    Special Requests   Final    URINE, CLEAN CATCH Performed at [facility], [place] [address] [place], [place] [address]  Culture   Final    NO GROWTH Performed at [facility], [place] [address], [place], [place] [address]    Report Status 09/21/2020 FINAL  Final  Culture, blood (Routine x 2)     Status: None (Preliminary result)   Collection Time: 09/21/20 11:26 AM   Specimen: BLOOD  Result Value Ref Range Status   Specimen Description   Final    BLOOD BLOOD LEFT FOREARM Performed at [facility] [address] [place], [place] [address]    Special Requests   Final    BOTTLES DRAWN AEROBIC AND ANAEROBIC Blood Culture results may not be optimal due to an inadequate volume of blood received in culture bottles Performed at [facility] [address], [place], [place] [address]    Culture  Setup Time   Final    GRAM NEGATIVE RODS IN BOTH AEROBIC AND ANAEROBIC BOTTLES CRITICAL RESULT CALLED TO, READ BACK BY AND VERIFIED WITH: PHARMD Joyice Faster [phone] FCP Performed at [facility], [place] [address]  [place], [place] [address]    Culture GRAM NEGATIVE RODS  Final   Report Status PENDING  Incomplete  Blood Culture ID Panel (Reflexed)     Status: Abnormal   Collection Time: 09/21/20 11:26 AM  Result Value Ref Range Status   Enterococcus faecalis NOT DETECTED NOT DETECTED Final   Enterococcus Faecium NOT DETECTED NOT DETECTED Final   Listeria monocytogenes NOT DETECTED NOT DETECTED Final   Staphylococcus species NOT DETECTED NOT DETECTED Final   Staphylococcus aureus (BCID) NOT DETECTED NOT DETECTED Final   Staphylococcus epidermidis NOT DETECTED NOT DETECTED Final   Staphylococcus lugdunensis NOT DETECTED NOT DETECTED Final   Streptococcus species NOT DETECTED NOT DETECTED Final   Streptococcus agalactiae NOT DETECTED NOT DETECTED Final   Streptococcus pneumoniae NOT DETECTED NOT DETECTED Final   Streptococcus pyogenes NOT DETECTED NOT DETECTED Final   A.calcoaceticus-baumannii NOT DETECTED NOT DETECTED Final   Bacteroides fragilis NOT DETECTED NOT DETECTED Final   Enterobacterales DETECTED (A) NOT DETECTED Final    Comment: Enterobacterales represent a large order of gram negative bacteria, not a single organism. CRITICAL RESULT CALLED TO, READ BACK BY AND VERIFIED WITH: PHARMD NICK G. [phone] FCP    Enterobacter cloacae complex NOT DETECTED NOT DETECTED Final   Escherichia coli NOT DETECTED NOT DETECTED Final   Klebsiella aerogenes DETECTED (A) NOT DETECTED Final    Comment: CRITICAL RESULT CALLED TO, READ BACK BY AND VERIFIED WITH: PHARMD NICK G. [phone] FCP    Klebsiella oxytoca NOT DETECTED NOT DETECTED Final   Klebsiella pneumoniae NOT DETECTED NOT DETECTED Final   Proteus species NOT DETECTED NOT DETECTED Final   Salmonella species NOT DETECTED NOT DETECTED Final   Serratia marcescens NOT DETECTED NOT DETECTED Final   Haemophilus influenzae NOT DETECTED NOT DETECTED Final   Neisseria meningitidis NOT DETECTED NOT DETECTED Final   Pseudomonas aeruginosa NOT DETECTED  NOT DETECTED Final   Stenotrophomonas maltophilia NOT DETECTED NOT DETECTED Final   Candida albicans NOT DETECTED NOT DETECTED Final   Candida auris NOT DETECTED NOT DETECTED Final   Candida glabrata NOT DETECTED NOT DETECTED Final   Candida krusei NOT DETECTED NOT DETECTED Final   Candida parapsilosis NOT DETECTED NOT DETECTED Final   Candida tropicalis NOT DETECTED NOT DETECTED Final   Cryptococcus neoformans/gattii NOT DETECTED NOT DETECTED Final   CTX-M ESBL NOT DETECTED NOT DETECTED Final   Carbapenem resistance IMP NOT DETECTED NOT DETECTED  Final   Carbapenem resistance KPC NOT DETECTED NOT DETECTED Final   Carbapenem resistance NDM NOT DETECTED NOT DETECTED Final   Carbapenem resist OXA 48 LIKE NOT DETECTED NOT DETECTED Final   Carbapenem resistance VIM NOT DETECTED NOT DETECTED Final    Comment: Performed at [facility], [place] [address] [place], [place] [address]  Resp Panel by RT-PCR (Flu A&B, Covid) Nasopharyngeal Swab     Status: None   Collection Time: 09/21/20 11:33 AM   Specimen: Nasopharyngeal Swab; Nasopharyngeal(NP) swabs in vial transport medium  Result Value Ref Range Status   SARS Coronavirus 2 by RT PCR NEGATIVE NEGATIVE Final    Comment: (NOTE) SARS-CoV-2 target nucleic acids are NOT DETECTED.  The SARS-CoV-2 RNA is generally detectable in upper respiratory specimens during the acute phase of infection. The lowest concentration of SARS-CoV-2 viral copies this assay can detect is 138 copies/mL. A negative result does not preclude SARS-Cov-2 infection and should not be used as the sole basis for treatment or other patient management decisions. A negative result may occur with  improper specimen collection/handling, submission of specimen other than nasopharyngeal swab, presence of viral mutation(s) within the areas targeted by this assay, and inadequate number of viral copies(<138 copies/mL). A negative result must be combined with clinical observations,  patient history, and epidemiological information. The expected result is Negative.  Fact Sheet for Patients:  EntrepreneurPulse.com.au  Fact Sheet for Healthcare Providers:  IncredibleEmployment.be  This test is no t yet approved or cleared by the [place] FDA and  has been authorized for detection and/or diagnosis of SARS-CoV-2 by FDA under an Emergency Use Authorization (EUA). This EUA will remain  in effect (meaning this test can be used) for the duration of the COVID-19 declaration under Section 564(b)(1) of the Act, 21 U.S.C.section 360bbb-3(b)(1), unless the authorization is terminated  or revoked sooner.       Influenza A by PCR NEGATIVE NEGATIVE Final   Influenza B by PCR NEGATIVE NEGATIVE Final    Comment: (NOTE) The Xpert Xpress SARS-CoV-2/FLU/RSV plus assay is intended as an aid in the diagnosis of influenza from Nasopharyngeal swab specimens and should not be used as a sole basis for treatment. Nasal washings and aspirates are unacceptable for Xpert Xpress SARS-CoV-2/FLU/RSV testing.  Fact Sheet for Patients: EntrepreneurPulse.com.au  Fact Sheet for Healthcare Providers: IncredibleEmployment.be  This test is not yet approved or cleared by the [place] FDA and has been authorized for detection and/or diagnosis of SARS-CoV-2 by FDA under an Emergency Use Authorization (EUA). This EUA will remain in effect (meaning this test can be used) for the duration of the COVID-19 declaration under Section 564(b)(1) of the Act, 21 U.S.C. section 360bbb-3(b)(1), unless the authorization is terminated or revoked.  Performed at [facility], [place] [address], [place], [place] [address]   Culture, blood (Routine x 2)     Status: None (Preliminary result)   Collection Time: 09/21/20 11:42 AM   Specimen: BLOOD  Result Value Ref Range Status   Specimen Description   Final    BLOOD  PORTA CATH Performed at [facility] [address], [place], [place] [address]    Special Requests   Final    BOTTLES DRAWN AEROBIC AND ANAEROBIC Blood Culture adequate volume Performed at [facility] [address], [place], [place] [address]    Culture  Setup Time   Final    GRAM NEGATIVE RODS IN BOTH AEROBIC AND ANAEROBIC BOTTLES Performed at  [facility], [place] [address] [place], [place] [address]    Culture GRAM NEGATIVE RODS  Final   Report Status PENDING  Incomplete  MRSA PCR Screening     Status: None   Collection Time: 09/21/20  4:44 PM  Result Value Ref Range Status   MRSA by PCR NEGATIVE NEGATIVE Final    Comment:        The GeneXpert MRSA Assay (FDA approved for NASAL specimens only), is one component of a comprehensive MRSA colonization surveillance program. It is not intended to diagnose MRSA infection nor to guide or monitor treatment for MRSA infections. Performed at [facility], [place] [address] [place], [place] [address]          Radiology Studies: DG Chest 2 View  Result Date: 09/21/2020 CLINICAL DATA:  Weakness and fever for 2 weeks. EXAM: CHEST - 2 VIEW COMPARISON:  03/26/2019 FINDINGS: The cardiomediastinal silhouette is unremarkable. Mild peribronchial thickening again noted. A RIGHT Port-A-Cath is again noted with tip overlying the SUPERIOR cavoatrial junction. There is no evidence of focal airspace disease, pulmonary edema, suspicious pulmonary nodule/mass, pleural effusion, or pneumothorax. No acute bony abnormalities are identified. IMPRESSION: No active cardiopulmonary disease. Electronically Signed   By: Margarette Canada M.D.   On: 09/21/2020 12:23   CT CHEST ABDOMEN PELVIS W CONTRAST  Result Date: 09/21/2020 CLINICAL DATA:  Abdominal pain, fever, weakness. Urinary tract infection. Gastrointestinal cancer. EXAM: CT CHEST, ABDOMEN, AND PELVIS WITH CONTRAST TECHNIQUE: Multidetector CT  imaging of the chest, abdomen and pelvis was performed following the standard protocol during bolus administration of intravenous contrast. CONTRAST:  154mL OMNIPAQUE IOHEXOL 300 MG/ML  SOLN COMPARISON:  CT abdomen pelvis 09/10/2020 and CT chest 06/12/2020. MR abdomen 09/04/2020 and PET 06/25/2020 FINDINGS: CT CHEST FINDINGS Cardiovascular: Right IJ Port-A-Cath terminates at the SVC RA junction. Heart is at the upper limits of normal in size. No pericardial effusion. Mediastinum/Nodes: Mediastinal lymph nodes measure up to 11 mm in the low right paratracheal station, similar. Calcified mediastinal lymph nodes. No hilar or axillary adenopathy. Esophagus is grossly. Lungs/Pleura: Centrilobular and paraseptal emphysema. Calcified granulomas. Minimal dependent atelectasis bilaterally. No pleural fluid. Adherent debris in the right mainstem bronchus. Musculoskeletal: Degenerative changes in the spine. No worrisome lytic or sclerotic lesions. CT ABDOMEN PELVIS FINDINGS Hepatobiliary: Similar biliary ductal dilatation with a percutaneous biliary drainage catheter in place, terminating in the duodenum. Associated centrally obstructing mass, better seen on 09/04/2020. 9 mm low-attenuation lesion in the periphery of segment 4 (2/65), unchanged and too small to characterize. Other subcapsular metastases seen on 09/03/2020 are not readily appreciated. Gallbladder is dilated. Pancreas: Mass seen in the pancreatic body on 09/04/2020 is poorly appreciated. Spleen: Negative. Adrenals/Urinary Tract: Adrenal glands are unremarkable. Right renal stone. Kidneys are otherwise unremarkable. Ureters are decompressed. Bladder may be minimally thick-walled. Stomach/Bowel: Distal gastrectomy. Stomach, small bowel and appendix are otherwise unremarkable. Fluid seen in the colon. Vascular/Lymphatic: Vascular structures are unremarkable. No pathologically enlarged lymph nodes. Reproductive: Prostate is visualized. Other: Bilateral omental  nodules measure up to 1.4 cm, as on 09/10/2020. Mesenteric nodules measure up to 8 mm in short axis (2/86). No free fluid. Left inguinal hernia contains fat. Musculoskeletal: Degenerative changes in the spine. IMPRESSION: 1. No findings to explain the patient's acute clinical history. 2. Peritoneal/omental metastases, as on 09/10/2020. 3. Percutaneous biliary drain in place with similar gallbladder and biliary ductal dilatation. Associated obstructing mass seen on 09/04/2020 is better seen on that study. 4. Pancreatic body mass, also better  seen on 09/04/2020. 5. Right renal stone. Electronically Signed   By: Lorin Picket M.D.   On: 09/21/2020 14:47   US ABDOMEN LIMITED RUQ (LIVER/GB)  Result Date: 09/21/2020 CLINICAL DATA:  Fever EXAM: ULTRASOUND ABDOMEN LIMITED RIGHT UPPER QUADRANT COMPARISON:  None. FINDINGS: Gallbladder: No gallstones. Gallbladder wall edema. Minor pericholecystic fluid. No sonographic Murphy sign noted by sonographer. Common bile duct: Diameter: Not well visualized, measuring 6 mm. Echogenic material may reflect sludge. A percutaneous biliary drain is noted on the prior CT. Liver: No focal lesion identified. Intrahepatic duct dilatation. Within normal limits in parenchymal echogenicity. Portal vein is patent on color Doppler imaging with normal direction of blood flow towards the liver. Other: None. IMPRESSION: Nonspecific gallbladder wall edema. No sonographic evidence of acute cholecystitis. Intrahepatic duct dilatation. Common bile duct containing echogenic material that may reflect sludge. Electronically Signed   By: Macy Mis M.D.   On: 09/21/2020 20:35        Scheduled Meds:  chlorhexidine  15 mL Mouth Rinse BID   Chlorhexidine Gluconate Cloth  6 each Topical Daily   dorzolamide-timolol  1 drop Both Eyes BID   latanoprost  1 drop Both Eyes QHS   mouth rinse  15 mL Mouth Rinse q12n4p   Continuous Infusions:  sodium chloride 125 mL/hr at 09/22/20 0926   ceFEPime  (MAXIPIME) IV Stopped (09/22/20 0603)   norepinephrine (LEVOPHED) Adult infusion 1 mcg/min (09/22/20 0926)     LOS: 1 day    Time spent: 6 min    Desma Maxim, MD [facility]   If 7PM-7AM, please contact night-coverage [email or web address] Password [facility] 09/22/2020, 9:52 AM

## 2020-09-22 NOTE — Consult Note (Signed)
[facility]                                                                                        Patient Identification: Patient Name: Jonathan Lewis MRN: [id number] [place] Date: 09/21/2020 11:23 AM Today's Date: 09/22/2020 Reason for consult: Gram-negative bacteremia Requesting provider: Ailene Rud [facility]   Principal Problem:   SIRS (systemic inflammatory response syndrome) ([place]) Active Problems:   Diabetes ([place])   Benign nodular prostatic hyperplasia   Gastric cancer pT3pN2 s/p distal gastrectomy with Billroth II gastrojejunostomy 03/19/2019   Sepsis ([place])   Antibiotics: Vancomycin, cefepime, metronidazole 6/20-current  Lines/Tubes: Implanted port, PIV's, biliary tube  Assessment Septic shock Klebsiella aerogenes bacteremia:.  Likely biliary source No urinary symptoms.  UA is clean Port-A-Cath in place-denies any pain/redness/swelling or tenderness  Metastatic adenocarcinoma with malignant obstruction of the biliary duct status post PERC by IR on 6/9 IR planning for cholangiogram and drain capping trial Oncology following, plan for palliative radiation  Elevated Bilirubin/ALP/Transaminitis -downtrending  Thrombocytopenia  Recommendations  De-escalate antibiotics to cefepime Follow-up sensitivities of Klebsiella aerogenes Will plan to salvage the Port-A-Cath Follow-up oncology and GI recommendations Monitor CBC and BMP on IV antibiotics Following  Rest of the management as per the primary team. Please call with questions or concerns.  Thank you for the consult  Rosiland Oz, MD Infectious Disease Physician [facility] [address] [place], [place] [address] Phone: [phone]  Fax: [phone]  __________________________________________________________________________________________________________ HPI and  Hospital Course: 80 year old male with past medical history significant for metastatic adenocarcinoma of the stomach on palliative chemo by Dr Marin Olp, DM and hypertension who presented to the ED on 6/20 with complaints of fatigue.  Patient was recently hospitalized 6/8-6/11 for biliary obstruction secondary to metastatic cancer where it was unable to place a biliary stent on 6/8 followed by failed [facility] biliary drain placement. Patient finally had successful IR guided percutaneous biliary drain placed by IR on 6/9. Patient was also seen in the ED on 6/18 for urinary catheter bag replacement found to be febrile and cephalexin was empirically prescribed for UTI. per wife patient has been having fatigue for few weeks and has not been getting out of bed, less interactive and more fatigued in the last few days. Patient had one episode of vomiting on day of admission but denies abdominal pain/tenderness, constipation or diarrhea. Denies any GU symptoms. Appetite is poor. He denies any pain/tenderness and swelling at  the port site.   At ED, he was febrile with T-max of 102 Fahrenheit, WBC 12.7 with bands.  labs was remarkable for NA 123 AST 147, ALT 90 T bili 3.6 ALP 189.  Lactic acid 2.6 Chest x-ray was unremarkable CT chest abdomen pelvis with no new findings Ultrasound right upper quadrant with nonspecific gallbladder wall edema, intrahepatic duct dilatation, common bile duct containing echogenic material that may reflect sludge.    ROS: General- Denies fever, chills, loss of appetite and loss of weight HEENT - Denies headache, blurry vision, neck pain, sinus pain Chest - Denies any chest pain, SOB or cough CVS- Denies any  dizziness/lightheadedness, syncopal attacks, palpitations Abdomen- Denies any nausea, vomiting, abdominal pain, hematochezia and diarrhea Neuro - Denies any weakness, numbness, tingling sensation Psych - Denies any changes in mood irritability or depressive symptoms GU- Denies any  burning, dysuria, hematuria or increased frequency of urination Skin - denies any rashes/lesions MSK - denies any joint pain/swelling or restricted ROM   Past Medical History:  Diagnosis Date   Acoustic neuroma ([place]) 06/25/2015   Allergic rhinitis 07/18/2014   Allergy    Anemia    Arthritis    BPH (benign prostatic hyperplasia)    Cancer ([place])    stage III stomach-Dx 03/2019   Carbuncle    recurrent MRSA carbuncles   Cataract    Per pt bilateral cataracts removed.   Diabetes mellitus    Type II. Per pt Dr. Jenny Reichmann took him off his dm med.   Disk prolapse    Glaucoma    Hyperlipidemia    Hypertension    Male hypogonadism 07/16/2014   Neuropathy    Pneumonia    Sinus bradycardia    chronic, asymptomatic   Sinusitis 07/29/2012   Past Surgical History:  Procedure Laterality Date   ANTERIOR CERVICAL DECOMPRESSION/DISCECTOMY FUSION 4 LEVELS N/A 01/08/2020   Procedure: ANTERIOR CERVICAL DECOMPRESSION FUSION CERVICAL 3-4, CERVICAL 4-5, CERVIAL 5-6 WITH INSTRUMENTATION AND ALLOGRAFT;  Surgeon: Phylliss Bob, MD;  Location: [facility];  Service: Orthopedics;  Laterality: N/A;   BALLOON DILATION N/A 03/12/2019   Procedure: BALLOON DILATION;  Surgeon: Rush Landmark Telford Nab., MD;  [facility];  Service: Gastroenterology;  Laterality: N/A;  pyloric   BIOPSY  03/08/2019   Procedure: BIOPSY;  Surgeon: Yetta Flock, MD;  Location: [facility];  Service: Gastroenterology;;   BIOPSY  03/12/2019   Procedure: BIOPSY;  Surgeon: Irving Copas., MD;  [facility];  Service: Gastroenterology;;   CATARACT EXTRACTION     x 2   COLONOSCOPY     ENDOSCOPIC RETROGRADE CHOLANGIOPANCREATOGRAPHY (ERCP) WITH PROPOFOL N/A 09/09/2020   Procedure: ENDOSCOPIC RETROGRADE CHOLANGIOPANCREATOGRAPHY (ERCP) WITH PROPOFOL;  Surgeon: Irving Copas., MD;  Location: [facility];  Service: Gastroenterology;  Laterality: N/A;   ESOPHAGOGASTRODUODENOSCOPY N/A 03/08/2019   Procedure:  ESOPHAGOGASTRODUODENOSCOPY (EGD);  Surgeon: Yetta Flock, MD;  Location: [facility];  Service: Gastroenterology;  Laterality: N/A;   ESOPHAGOGASTRODUODENOSCOPY (EGD) WITH PROPOFOL N/A 03/12/2019   Procedure: ESOPHAGOGASTRODUODENOSCOPY (EGD) WITH PROPOFOL;  Surgeon: Rush Landmark Telford Nab., MD;  [facility];  Service: Gastroenterology;  Laterality: N/A;   FINE NEEDLE ASPIRATION  03/12/2019   Procedure: FINE NEEDLE ASPIRATION (FNA) LINEAR;  Surgeon: Irving Copas., MD;  [facility];  Service: Gastroenterology;;   FOREIGN BODY REMOVAL  03/08/2019   Procedure: FOREIGN BODY REMOVAL;  Surgeon: Yetta Flock, MD;  [facility];  Service: Gastroenterology;;   IR BILIARY DRAIN PLACEMENT WITH CHOLANGIOGRAM  09/09/2020   IR INT EXT BILIARY DRAIN WITH CHOLANGIOGRAM  09/10/2020   IR PATIENT EVAL TECH 0-60 MINS  04/03/2019   LAPAROTOMY N/A 03/19/2019   Procedure: EXPLORATORY LAPAROTOMY, distal GASTRECTOMY AND PLACEMENT OF G AND J TUBE, gastric jejunostomy;  Surgeon: Greer Pickerel, MD;  [facility];  Service: General;  Laterality: N/A;   POLYPECTOMY     PORTACATH PLACEMENT N/A 03/26/2019   Procedure: INSERTION PORT-A-CATH;  Surgeon: Michael Boston, MD;  [facility];  Service: General;  Laterality: N/A;   [place] INJECTION  09/09/2020   Procedure: SUBMUCOSAL TATTOO INJECTION;  Surgeon: Irving Copas., MD;  [facility];  Service: Gastroenterology;;   UPPER ESOPHAGEAL ENDOSCOPIC ULTRASOUND (EUS) N/A 03/12/2019   Procedure: UPPER ESOPHAGEAL ENDOSCOPIC ULTRASOUND (EUS);  Surgeon: Irving Copas., MD;  Location: [facility];  Service: Gastroenterology;  Laterality: N/A;   UPPER GASTROINTESTINAL ENDOSCOPY     VIDEO BRONCHOSCOPY WITH ENDOBRONCHIAL ULTRASOUND N/A 05/22/2019   Procedure: VIDEO BRONCHOSCOPY WITH ENDOBRONCHIAL ULTRASOUND;  Surgeon: Garner Nash, DO;  Location: [facility];  Service: Thoracic;   Laterality: N/A;    Scheduled Meds:  chlorhexidine  15 mL Mouth Rinse BID   Chlorhexidine Gluconate Cloth  6 each Topical Daily   dorzolamide-timolol  1 drop Both Eyes BID   latanoprost  1 drop Both Eyes QHS   mouth rinse  15 mL Mouth Rinse q12n4p   Continuous Infusions:  sodium chloride 125 mL/hr at 09/22/20 0926   ceFEPime (MAXIPIME) IV Stopped (09/22/20 0603)   norepinephrine (LEVOPHED) Adult infusion 1 mcg/min (09/22/20 0926)   PRN Meds:.  No Known Allergies  Social History   Socioeconomic History   Marital status: Married    Spouse name: Not on file   Number of children: 7   Years of education: 7   Highest education level: Not on file  Occupational History   Occupation: pastor    Employer: RETIRED  Tobacco Use   Smoking status: Former    Packs/day: 1.00    Years: 24.00    Pack years: 24.00    Types: Cigarettes    Quit date: 10/03/1978    Years since quitting: 42.0   Smokeless tobacco: Never  Vaping Use   Vaping Use: Never used  Substance and Sexual Activity   Alcohol use: Not Currently   Drug use: No   Sexual activity: Yes    Partners: Female  Other Topics Concern   Not on file  Social History Narrative   9th grade.  Married '60. [name] 3 sons -'28 '64, '66; 4 dtrs -'60, '62, '61, '65; 15 grands, 25 great-grands.   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   ACD- discussed living will and HCPOA (July '14) provided packet.          Social Determinants of Health   Financial Resource Strain: Not on file  Food Insecurity: Not on file  Transportation Needs: Not on file  Physical Activity: Not on file  Stress: Not on file  Social Connections: Not on file  Intimate Partner Violence: Not on file    Vitals BP 116/82   Pulse 62   Temp (!) 97.4 F (36.3 C) (Oral)   Resp 12   SpO2 100%    Physical Exam Constitutional:  not in acute distress, answers simple questions     Comments:   Cardiovascular:     Rate and Rhythm: Normal rate and  regular rhythm.     Heart sounds:   Pulmonary:     Effort: Pulmonary effort is normal.     Comments:   Abdominal:     Palpations: Abdomen is soft.     Tenderness: PEG+, non tender   Musculoskeletal:        General: No swelling or tenderness.   Skin:    Comments: No lesions or rashes   Neurological:     General: No focal deficit present.   Psychiatric:        Mood and Affect: Mood normal.    Pertinent Microbiology Results for orders placed or performed during the hospital encounter of 09/21/20  Culture, blood (Routine x 2)  Status: None (Preliminary result)   Collection Time: 09/21/20 11:26 AM   Specimen: BLOOD  Result Value Ref Range Status   Specimen Description   Final    BLOOD BLOOD LEFT FOREARM Performed at [facility] [address] [place], [place] [address]    Special Requests   Final    BOTTLES DRAWN AEROBIC AND ANAEROBIC Blood Culture results may not be optimal due to an inadequate volume of blood received in culture bottles Performed at [facility] [address], [place], [place] [address]    Culture  Setup Time   Final    GRAM NEGATIVE RODS IN BOTH AEROBIC AND ANAEROBIC BOTTLES CRITICAL RESULT CALLED TO, READ BACK BY AND VERIFIED WITH: PHARMD Joyice Faster [phone] FCP Performed at [facility], [place] [address], [place], [place] [address]    Culture GRAM NEGATIVE RODS  Final   Report Status PENDING  Incomplete  Blood Culture ID Panel (Reflexed)     Status: Abnormal   Collection Time: 09/21/20 11:26 AM  Result Value Ref Range Status   Enterococcus faecalis NOT DETECTED NOT DETECTED Final   Enterococcus Faecium NOT DETECTED NOT DETECTED Final   Listeria monocytogenes NOT DETECTED NOT DETECTED Final   Staphylococcus species NOT DETECTED NOT DETECTED Final   Staphylococcus aureus (BCID) NOT DETECTED NOT DETECTED Final   Staphylococcus epidermidis NOT DETECTED NOT DETECTED Final   Staphylococcus lugdunensis  NOT DETECTED NOT DETECTED Final   Streptococcus species NOT DETECTED NOT DETECTED Final   Streptococcus agalactiae NOT DETECTED NOT DETECTED Final   Streptococcus pneumoniae NOT DETECTED NOT DETECTED Final   Streptococcus pyogenes NOT DETECTED NOT DETECTED Final   A.calcoaceticus-baumannii NOT DETECTED NOT DETECTED Final   Bacteroides fragilis NOT DETECTED NOT DETECTED Final   Enterobacterales DETECTED (A) NOT DETECTED Final    Comment: Enterobacterales represent a large order of gram negative bacteria, not a single organism. CRITICAL RESULT CALLED TO, READ BACK BY AND VERIFIED WITH: PHARMD NICK G. [phone] FCP    Enterobacter cloacae complex NOT DETECTED NOT DETECTED Final   Escherichia coli NOT DETECTED NOT DETECTED Final   Klebsiella aerogenes DETECTED (A) NOT DETECTED Final    Comment: CRITICAL RESULT CALLED TO, READ BACK BY AND VERIFIED WITH: PHARMD NICK G. [phone] FCP    Klebsiella oxytoca NOT DETECTED NOT DETECTED Final   Klebsiella pneumoniae NOT DETECTED NOT DETECTED Final   Proteus species NOT DETECTED NOT DETECTED Final   Salmonella species NOT DETECTED NOT DETECTED Final   Serratia marcescens NOT DETECTED NOT DETECTED Final   Haemophilus influenzae NOT DETECTED NOT DETECTED Final   Neisseria meningitidis NOT DETECTED NOT DETECTED Final   Pseudomonas aeruginosa NOT DETECTED NOT DETECTED Final   Stenotrophomonas maltophilia NOT DETECTED NOT DETECTED Final   Candida albicans NOT DETECTED NOT DETECTED Final   Candida auris NOT DETECTED NOT DETECTED Final   Candida glabrata NOT DETECTED NOT DETECTED Final   Candida krusei NOT DETECTED NOT DETECTED Final   Candida parapsilosis NOT DETECTED NOT DETECTED Final   Candida tropicalis NOT DETECTED NOT DETECTED Final   Cryptococcus neoformans/gattii NOT DETECTED NOT DETECTED Final   CTX-M ESBL NOT DETECTED NOT DETECTED Final   Carbapenem resistance IMP NOT DETECTED NOT DETECTED Final   Carbapenem resistance KPC NOT DETECTED  NOT DETECTED Final   Carbapenem resistance NDM NOT DETECTED NOT DETECTED Final   Carbapenem resist OXA 48 LIKE NOT DETECTED NOT DETECTED Final   Carbapenem resistance VIM NOT DETECTED NOT  DETECTED Final    Comment: Performed at [facility], [place] [address], [place], [place] [address]  Resp Panel by RT-PCR (Flu A&B, Covid) Nasopharyngeal Swab     Status: None   Collection Time: 09/21/20 11:33 AM   Specimen: Nasopharyngeal Swab; Nasopharyngeal(NP) swabs in vial transport medium  Result Value Ref Range Status   SARS Coronavirus 2 by RT PCR NEGATIVE NEGATIVE Final    Comment: (NOTE) SARS-CoV-2 target nucleic acids are NOT DETECTED.  The SARS-CoV-2 RNA is generally detectable in upper respiratory specimens during the acute phase of infection. The lowest concentration of SARS-CoV-2 viral copies this assay can detect is 138 copies/mL. A negative result does not preclude SARS-Cov-2 infection and should not be used as the sole basis for treatment or other patient management decisions. A negative result may occur with  improper specimen collection/handling, submission of specimen other than nasopharyngeal swab, presence of viral mutation(s) within the areas targeted by this assay, and inadequate number of viral copies(<138 copies/mL). A negative result must be combined with clinical observations, patient history, and epidemiological information. The expected result is Negative.  Fact Sheet for Patients:  EntrepreneurPulse.com.au  Fact Sheet for Healthcare Providers:  IncredibleEmployment.be  This test is no t yet approved or cleared by the [place] FDA and  has been authorized for detection and/or diagnosis of SARS-CoV-2 by FDA under an Emergency Use Authorization (EUA). This EUA will remain  in effect (meaning this test can be used) for the duration of the COVID-19 declaration under Section 564(b)(1) of the Act, 21 U.S.C.section  360bbb-3(b)(1), unless the authorization is terminated  or revoked sooner.       Influenza A by PCR NEGATIVE NEGATIVE Final   Influenza B by PCR NEGATIVE NEGATIVE Final    Comment: (NOTE) The Xpert Xpress SARS-CoV-2/FLU/RSV plus assay is intended as an aid in the diagnosis of influenza from Nasopharyngeal swab specimens and should not be used as a sole basis for treatment. Nasal washings and aspirates are unacceptable for Xpert Xpress SARS-CoV-2/FLU/RSV testing.  Fact Sheet for Patients: EntrepreneurPulse.com.au  Fact Sheet for Healthcare Providers: IncredibleEmployment.be  This test is not yet approved or cleared by the [place] FDA and has been authorized for detection and/or diagnosis of SARS-CoV-2 by FDA under an Emergency Use Authorization (EUA). This EUA will remain in effect (meaning this test can be used) for the duration of the COVID-19 declaration under Section 564(b)(1) of the Act, 21 U.S.C. section 360bbb-3(b)(1), unless the authorization is terminated or revoked.  Performed at [facility], [place] [address], [place], [place] [address]   Culture, blood (Routine x 2)     Status: None (Preliminary result)   Collection Time: 09/21/20 11:42 AM   Specimen: BLOOD  Result Value Ref Range Status   Specimen Description   Final    BLOOD PORTA CATH Performed at [facility] [address] [place], [place] [address]    Special Requests   Final    BOTTLES DRAWN AEROBIC AND ANAEROBIC Blood Culture adequate volume Performed at [facility] [address] [place], [place] [address]    Culture  Setup Time   Final    GRAM NEGATIVE RODS IN BOTH AEROBIC AND ANAEROBIC BOTTLES Performed at [facility], [place] [address], [place], [place] [address]    Culture GRAM NEGATIVE RODS  Final   Report Status PENDING  Incomplete  MRSA PCR Screening     Status: None   Collection Time:  09/21/20  4:44 PM  Result Value Ref Range Status   MRSA by PCR NEGATIVE NEGATIVE Final    Comment:        The GeneXpert MRSA Assay (FDA approved for NASAL specimens only), is one component of a comprehensive MRSA colonization surveillance program. It is not intended to diagnose MRSA infection nor to guide or monitor treatment for MRSA infections. Performed at [facility], [place] [address], [place], [place] [address]     Pertinent Lab seen by me: CBC Latest Ref Rng & Units 09/22/2020 09/21/2020 09/14/2020  WBC 4.0 - 10.5 K/uL 10.0 12.7(H) 5.6  Hemoglobin 13.0 - 17.0 g/dL 9.2(L) 9.7(L) 10.9(L)  Hematocrit 39.0 - 52.0 % 26.9(L) 28.4(L) 32.9(L)  Platelets 150 - 400 K/uL 105(L) 155 197   CMP Latest Ref Rng & Units 09/22/2020 09/21/2020 09/21/2020  Glucose 70 - 99 mg/dL 151(H) 130(H) 152(H)  BUN 8 - 23 mg/dL 17 18 22   Creatinine 0.61 - 1.24 mg/dL 0.81 0.96 0.96  Sodium 135 - 145 mmol/L 132(L) 130(L) 123(L)  Potassium 3.5 - 5.1 mmol/L 3.6 3.4(L) 3.5  Chloride 98 - 111 mmol/L 108 105 94(L)  CO2 22 - 32 mmol/L 21(L) 20(L) 19(L)  Calcium 8.9 - 10.3 mg/dL 7.2(L) 7.3(L) 7.8(L)  Total Protein 6.5 - 8.1 g/dL 5.1(L) - 6.2(L)  Total Bilirubin 0.3 - 1.2 mg/dL 3.3(H) - 3.6(H)  Alkaline Phos 38 - 126 U/L 165(H) - 189(H)  AST 15 - 41 U/L 103(H) - 147(H)  ALT 0 - 44 U/L 74(H) - 95(H)     Pertinent Imagings/Other Imagings Plain films and CT images have been personally visualized and interpreted; radiology reports have been reviewed. Decision making incorporated into the Impression / Recommendations.  Chst Xray 09/21/20 FINDINGS: The cardiomediastinal silhouette is unremarkable.   Mild peribronchial thickening again noted.   A RIGHT Port-A-Cath is again noted with tip overlying the SUPERIOR cavoatrial junction.   There is no evidence of focal airspace disease, pulmonary edema, suspicious pulmonary nodule/mass, pleural effusion, or pneumothorax.   No acute bony  abnormalities are identified.   IMPRESSION: No active cardiopulmonary disease.  CT Chest /abdomen/pelvis  IMPRESSION: 1. No findings to explain the patient's acute clinical history. 2. Peritoneal/omental metastases, as on 09/10/2020. 3. Percutaneous biliary drain in place with similar gallbladder and biliary ductal dilatation. Associated obstructing mass seen on 09/04/2020 is better seen on that study. 4. Pancreatic body mass, also better seen on 09/04/2020. 5. Right renal stone.  RUQ [place] 09/21/20 FINDINGS: Gallbladder:   No gallstones. Gallbladder wall edema. Minor pericholecystic fluid. No sonographic Murphy sign noted by sonographer.   Common bile duct:   Diameter: Not well visualized, measuring 6 mm. Echogenic material may reflect sludge. A percutaneous biliary drain is noted on the prior CT.   Liver:   No focal lesion identified. Intrahepatic duct dilatation. Within normal limits in parenchymal echogenicity. Portal vein is patent on color Doppler imaging with normal direction of blood flow towards the liver.   Other: None.   IMPRESSION: Nonspecific gallbladder wall edema. No sonographic evidence of acute cholecystitis.   Intrahepatic duct dilatation. Common bile duct containing echogenic material that may reflect sludge.  TTE 09/22/20 1. Left ventricular ejection fraction, by estimation, is 40 to 45%. The left ventricle has mildly decreased function. The left ventricle demonstrates global hypokinesis. Left ventricular diastolic parameters are consistent with Grade I diastolic dysfunction (impaired relaxation). 2. Right ventricular systolic function is normal. The right ventricular size is normal. Tricuspid regurgitation signal is inadequate for  assessing PA pressure. 3. The mitral valve is normal in structure. Mild mitral valve regurgitation. No evidence of mitral stenosis. 4. The aortic valve is tricuspid. Aortic valve regurgitation is not visualized. No aortic  stenosis is present. 5. Aortic dilatation noted. There is borderline dilatation of the aortic root, measuring 39 mm. 6. The inferior vena cava is normal in size with greater than 50% respiratory variability, suggesting right atrial pressure of 3 mmHg.   I have spent more than 70  minutes for this patient encounter including review of prior medical records with greater than 50% of time being face to face and coordination of their care.  Electronically signed by:   Rosiland Oz, MD Infectious Disease Physician [facility] Infect[facility] Disease Pager: [phone]

## 2020-09-22 NOTE — Progress Notes (Signed)
PHARMACY - PHYSICIAN COMMUNICATION CRITICAL VALUE ALERT - BLOOD CULTURE IDENTIFICATION (BCID)  Jonathan Lewis is an 80 y.o. male who presented to [facility] on 09/21/2020 with a chief complaint of encephalopathy, sepsis.  Assessment:  PMH of metastatic gastric cancer with recent biliary obstruction s/p perc stent placement on 6/9.  Blood cultures 4/4 bottles with GNR and BCID shows Klebsiella aerogenes.  ESBL not detected  Name of physician (or Provider) Contacted: Dr Si Raider  Current antibiotics: Cefepime, vancomycin, metronidazole  Changes to prescribed antibiotics recommended:  Recommendations accepted by provider D/c vancomycin and metronidazole, continue Cefepime.  Results for orders placed or performed during the hospital encounter of 09/21/20  Blood Culture ID Panel (Reflexed) (Collected: 09/21/2020 11:26 AM)  Result Value Ref Range   Enterococcus faecalis NOT DETECTED NOT DETECTED   Enterococcus Faecium NOT DETECTED NOT DETECTED   Listeria monocytogenes NOT DETECTED NOT DETECTED   Staphylococcus species NOT DETECTED NOT DETECTED   Staphylococcus aureus (BCID) NOT DETECTED NOT DETECTED   Staphylococcus epidermidis NOT DETECTED NOT DETECTED   Staphylococcus lugdunensis NOT DETECTED NOT DETECTED   Streptococcus species NOT DETECTED NOT DETECTED   Streptococcus agalactiae NOT DETECTED NOT DETECTED   Streptococcus pneumoniae NOT DETECTED NOT DETECTED   Streptococcus pyogenes NOT DETECTED NOT DETECTED   A.calcoaceticus-baumannii NOT DETECTED NOT DETECTED   Bacteroides fragilis NOT DETECTED NOT DETECTED   Enterobacterales DETECTED (A) NOT DETECTED   Enterobacter cloacae complex NOT DETECTED NOT DETECTED   Escherichia coli NOT DETECTED NOT DETECTED   Klebsiella aerogenes DETECTED (A) NOT DETECTED   Klebsiella oxytoca NOT DETECTED NOT DETECTED   Klebsiella pneumoniae NOT DETECTED NOT DETECTED   Proteus species NOT DETECTED NOT DETECTED   Salmonella species NOT DETECTED NOT DETECTED    Serratia marcescens NOT DETECTED NOT DETECTED   Haemophilus influenzae NOT DETECTED NOT DETECTED   Neisseria meningitidis NOT DETECTED NOT DETECTED   Pseudomonas aeruginosa NOT DETECTED NOT DETECTED   Stenotrophomonas maltophilia NOT DETECTED NOT DETECTED   Candida albicans NOT DETECTED NOT DETECTED   Candida auris NOT DETECTED NOT DETECTED   Candida glabrata NOT DETECTED NOT DETECTED   Candida krusei NOT DETECTED NOT DETECTED   Candida parapsilosis NOT DETECTED NOT DETECTED   Candida tropicalis NOT DETECTED NOT DETECTED   Cryptococcus neoformans/gattii NOT DETECTED NOT DETECTED   CTX-M ESBL NOT DETECTED NOT DETECTED   Carbapenem resistance IMP NOT DETECTED NOT DETECTED   Carbapenem resistance KPC NOT DETECTED NOT DETECTED   Carbapenem resistance NDM NOT DETECTED NOT DETECTED   Carbapenem resist OXA 48 LIKE NOT DETECTED NOT DETECTED   Carbapenem resistance VIM NOT DETECTED NOT DETECTED   Gretta Arab PharmD, BCPS Clinical Pharmacist [facility] main pharmacy [phone] 09/22/2020 8:35 AM

## 2020-09-22 NOTE — Progress Notes (Signed)
Progress Note   Subjective  Chief Complaint: Sepsis, question biliary source  Today, the patient is sitting up in bed awake with his family by his bedside.  He is much brighter and able to hold conversation this morning.  He looks very good.  Tells me that he feels good and is hungry.  Denies any new complaints or concerns.  Did have a bowel movement this morning.   Objective   Vital signs in last 24 hours: Temp:  [97.4 F (36.3 C)-102 F (38.9 C)] 97.4 F (36.3 C) (06/21 0800) Pulse Rate:  [56-99] 62 (06/21 0900) Resp:  [0-24] 12 (06/21 0900) BP: (70-153)/(44-106) 116/82 (06/21 0900) SpO2:  [89 %-100 %] 100 % (06/21 0900) Last BM Date: 09/21/20 General:    AA male in NAD Heart:  Regular rate and rhythm; no murmurs Lungs: Respirations even and unlabored, lungs CTA bilaterally Abdomen:  Soft, nontender and nondistended. Normal bowel sounds.+PBD drain Psych:  Cooperative. Normal mood and affect.  Intake/Output from previous day: 06/20 0701 - 06/21 0700 In: 6202.2 [I.V.:1769.9; IV Piggyback:4432.3] Out: 3000 [Urine:2625; Drains:375] Intake/Output this shift: Total I/O In: 523.8 [I.V.:395.9; IV Piggyback:127.9] Out: 451 [Urine:450; Stool:1]  Lab Results: Recent Labs    09/21/20 1126 09/22/20 0414  WBC 12.7* 10.0  HGB 9.7* 9.2*  HCT 28.4* 26.9*  PLT 155 105*   BMET Recent Labs    09/21/20 1126 09/21/20 2020 09/22/20 0414  NA 123* 130* 132*  K 3.5 3.4* 3.6  CL 94* 105 108  CO2 19* 20* 21*  GLUCOSE 152* 130* 151*  BUN 22 18 17   CREATININE 0.96 0.96 0.81  CALCIUM 7.8* 7.3* 7.2*   LFT Recent Labs    09/22/20 0414  PROT 5.1*  ALBUMIN 2.1*  AST 103*  ALT 74*  ALKPHOS 165*  BILITOT 3.3*   PT/INR Recent Labs    09/21/20 1126 09/22/20 0414  LABPROT 19.1* 21.2*  INR 1.6* 1.8*    Studies/Results: DG Chest 2 View  Result Date: 09/21/2020 CLINICAL DATA:  Weakness and fever for 2 weeks. EXAM: CHEST - 2 VIEW COMPARISON:  03/26/2019 FINDINGS: The  cardiomediastinal silhouette is unremarkable. Mild peribronchial thickening again noted. A RIGHT Port-A-Cath is again noted with tip overlying the SUPERIOR cavoatrial junction. There is no evidence of focal airspace disease, pulmonary edema, suspicious pulmonary nodule/mass, pleural effusion, or pneumothorax. No acute bony abnormalities are identified. IMPRESSION: No active cardiopulmonary disease. Electronically Signed   By: Margarette Canada M.D.   On: 09/21/2020 12:23   CT CHEST ABDOMEN PELVIS W CONTRAST  Result Date: 09/21/2020 CLINICAL DATA:  Abdominal pain, fever, weakness. Urinary tract infection. Gastrointestinal cancer. EXAM: CT CHEST, ABDOMEN, AND PELVIS WITH CONTRAST TECHNIQUE: Multidetector CT imaging of the chest, abdomen and pelvis was performed following the standard protocol during bolus administration of intravenous contrast. CONTRAST:  124mL OMNIPAQUE IOHEXOL 300 MG/ML  SOLN COMPARISON:  CT abdomen pelvis 09/10/2020 and CT chest 06/12/2020. MR abdomen 09/04/2020 and PET 06/25/2020 FINDINGS: CT CHEST FINDINGS Cardiovascular: Right IJ Port-A-Cath terminates at the SVC RA junction. Heart is at the upper limits of normal in size. No pericardial effusion. Mediastinum/Nodes: Mediastinal lymph nodes measure up to 11 mm in the low right paratracheal station, similar. Calcified mediastinal lymph nodes. No hilar or axillary adenopathy. Esophagus is grossly. Lungs/Pleura: Centrilobular and paraseptal emphysema. Calcified granulomas. Minimal dependent atelectasis bilaterally. No pleural fluid. Adherent debris in the right mainstem bronchus. Musculoskeletal: Degenerative changes in the spine. No worrisome lytic or sclerotic lesions. CT ABDOMEN PELVIS FINDINGS  Hepatobiliary: Similar biliary ductal dilatation with a percutaneous biliary drainage catheter in place, terminating in the duodenum. Associated centrally obstructing mass, better seen on 09/04/2020. 9 mm low-attenuation lesion in the periphery of segment 4  (2/65), unchanged and too small to characterize. Other subcapsular metastases seen on 09/03/2020 are not readily appreciated. Gallbladder is dilated. Pancreas: Mass seen in the pancreatic body on 09/04/2020 is poorly appreciated. Spleen: Negative. Adrenals/Urinary Tract: Adrenal glands are unremarkable. Right renal stone. Kidneys are otherwise unremarkable. Ureters are decompressed. Bladder may be minimally thick-walled. Stomach/Bowel: Distal gastrectomy. Stomach, small bowel and appendix are otherwise unremarkable. Fluid seen in the colon. Vascular/Lymphatic: Vascular structures are unremarkable. No pathologically enlarged lymph nodes. Reproductive: Prostate is visualized. Other: Bilateral omental nodules measure up to 1.4 cm, as on 09/10/2020. Mesenteric nodules measure up to 8 mm in short axis (2/86). No free fluid. Left inguinal hernia contains fat. Musculoskeletal: Degenerative changes in the spine. IMPRESSION: 1. No findings to explain the patient's acute clinical history. 2. Peritoneal/omental metastases, as on 09/10/2020. 3. Percutaneous biliary drain in place with similar gallbladder and biliary ductal dilatation. Associated obstructing mass seen on 09/04/2020 is better seen on that study. 4. Pancreatic body mass, also better seen on 09/04/2020. 5. Right renal stone. Electronically Signed   By: Lorin Picket M.D.   On: 09/21/2020 14:47   US ABDOMEN LIMITED RUQ (LIVER/GB)  Result Date: 09/21/2020 CLINICAL DATA:  Fever EXAM: ULTRASOUND ABDOMEN LIMITED RIGHT UPPER QUADRANT COMPARISON:  None. FINDINGS: Gallbladder: No gallstones. Gallbladder wall edema. Minor pericholecystic fluid. No sonographic Murphy sign noted by sonographer. Common bile duct: Diameter: Not well visualized, measuring 6 mm. Echogenic material may reflect sludge. A percutaneous biliary drain is noted on the prior CT. Liver: No focal lesion identified. Intrahepatic duct dilatation. Within normal limits in parenchymal echogenicity. Portal  vein is patent on color Doppler imaging with normal direction of blood flow towards the liver. Other: None. IMPRESSION: Nonspecific gallbladder wall edema. No sonographic evidence of acute cholecystitis. Intrahepatic duct dilatation. Common bile duct containing echogenic material that may reflect sludge. Electronically Signed   By: Macy Mis M.D.   On: 09/21/2020 20:35       Assessment / Plan:   Assessment: 1.  Sepsis: Most likely biliary source 2.  History of stage IIIa adenocarcinoma of the stomach:Status post distal gastrectomy December 2020, now with metastatic disease getting Taxol was on Cyramza recently held due to GI bleeding, presenting with sepsis after PBD placement 09/10/2020 for obstructive jaundice and biliary dilation 3.  Obstructive jaundice: MRCP showed diffuse biliary duct dilation with a soft tissue mass in the porta hepatis, likely metastatic, ERCP 09/09/2020 and the bile duct could not be cannulated, IR placed PBD 09/10/2020  Plan: 1.  Continue antibiotics 2.  Discussion had with IR about placing drain, but at this point with patient's great improvement overnight we will likely hold on this procedure. 3.  Please await further recommendations from Dr. Havery Moros we will likely sign off.  Thank you for your kind consultation.     LOS: 1 day   Levin Erp  09/22/2020, 9:48 AM

## 2020-09-22 NOTE — Progress Notes (Signed)
    BRIEF OVERNIGHT PROGRESS REPORT  Notified Dr. Serafina Royals as ordered for findings of earlier study. Findings of ultrasound conveyed and acknowledged. Patient remains on medications as earlier. No change found to be needed at this time.  Patient remains in SDU/ICU.   Gershon Cull MSNA ACNPC-AG Acute Care Nurse Practitioner [place]

## 2020-09-23 ENCOUNTER — Encounter (HOSPITAL_COMMUNITY): Payer: Self-pay | Admitting: Obstetrics and Gynecology

## 2020-09-23 ENCOUNTER — Ambulatory Visit
Admission: RE | Admit: 2020-09-23 | Discharge: 2020-09-23 | Disposition: A | Discharge status: Home / Self Care | Payer: Medicare PPO | Source: Ambulatory Visit | Attending: Radiation Oncology | Admitting: Radiation Oncology

## 2020-09-23 DIAGNOSIS — D649 Anemia, unspecified: Secondary | ICD-10-CM

## 2020-09-23 DIAGNOSIS — C169 Malignant neoplasm of stomach, unspecified: Secondary | ICD-10-CM | POA: Diagnosis not present

## 2020-09-23 DIAGNOSIS — R6510 Systemic inflammatory response syndrome (SIRS) of non-infectious origin without acute organ dysfunction: Secondary | ICD-10-CM | POA: Diagnosis not present

## 2020-09-23 DIAGNOSIS — E119 Type 2 diabetes mellitus without complications: Secondary | ICD-10-CM

## 2020-09-23 DIAGNOSIS — N139 Obstructive and reflux uropathy, unspecified: Secondary | ICD-10-CM

## 2020-09-23 DIAGNOSIS — E871 Hypo-osmolality and hyponatremia: Secondary | ICD-10-CM

## 2020-09-23 LAB — GLUCOSE, CAPILLARY: Glucose-Capillary: 85 mg/dL (ref 70–99)

## 2020-09-23 LAB — COMPREHENSIVE METABOLIC PANEL
ALT: 61 U/L — ABNORMAL HIGH (ref 0–44)
AST: 73 U/L — ABNORMAL HIGH (ref 15–41)
Albumin: 2 g/dL — ABNORMAL LOW (ref 3.5–5.0)
Alkaline Phosphatase: 182 U/L — ABNORMAL HIGH (ref 38–126)
Anion gap: 5 (ref 5–15)
BUN: 16 mg/dL (ref 8–23)
CO2: 20 mmol/L — ABNORMAL LOW (ref 22–32)
Calcium: 7.4 mg/dL — ABNORMAL LOW (ref 8.9–10.3)
Chloride: 109 mmol/L (ref 98–111)
Creatinine, Ser: 0.67 mg/dL (ref 0.61–1.24)
GFR, Estimated: >60 mL/min (ref >60)
Glucose, Bld: 103 mg/dL — ABNORMAL HIGH (ref 70–99)
Potassium: 4 mmol/L (ref 3.5–5.1)
Sodium: 134 mmol/L — ABNORMAL LOW (ref 135–145)
Total Bilirubin: 2.4 mg/dL — ABNORMAL HIGH (ref 0.3–1.2)
Total Protein: 4.9 g/dL — ABNORMAL LOW (ref 6.5–8.1)

## 2020-09-23 LAB — CBC
HCT: 26.1 % — ABNORMAL LOW (ref 39.0–52.0)
Hemoglobin: 8.8 g/dL — ABNORMAL LOW (ref 13.0–17.0)
MCH: 28.7 pg (ref 26.0–34.0)
MCHC: 33.7 g/dL (ref 30.0–36.0)
MCV: 85 fL (ref 80.0–100.0)
Platelets: 93 10*3/uL — ABNORMAL LOW (ref 150–400)
RBC: 3.07 MIL/uL — ABNORMAL LOW (ref 4.22–5.81)
RDW: 13.7 % (ref 11.5–15.5)
WBC: 6.2 10*3/uL (ref 4.0–10.5)
nRBC: 0 % (ref 0.0–0.2)

## 2020-09-23 LAB — URINE CULTURE: Culture: NO GROWTH

## 2020-09-23 MED ORDER — FLUCONAZOLE IN SODIUM CHLORIDE 400-0.9 MG/200ML-% IV SOLN
800.0000 mg | Freq: Once | INTRAVENOUS | Status: DC
  Filled 2020-09-23: qty 400

## 2020-09-23 MED ORDER — FLUCONAZOLE 200 MG PO TABS
400.0000 mg | ORAL_TABLET | Freq: Every day | ORAL | Status: AC
  Administered 2020-09-24 – 2020-09-29 (×6): 400 mg via ORAL
  Filled 2020-09-23 (×6): qty 2

## 2020-09-23 MED ORDER — FLUCONAZOLE IN SODIUM CHLORIDE 400-0.9 MG/200ML-% IV SOLN
400.0000 mg | INTRAVENOUS | Status: AC
  Administered 2020-09-23 (×2): 400 mg via INTRAVENOUS
  Filled 2020-09-23 (×2): qty 200

## 2020-09-23 NOTE — Plan of Care (Signed)

## 2020-09-23 NOTE — Progress Notes (Signed)
As expected, he is growing Klebsiella in his blood.  I am not surprised by this.  I have believe that the biliary system is the source.  He looks better.  He sounds better.  He is on IV antibiotics.  I know that the ICU team is managing this very expertly.  His bilirubin keeps coming down.  Bilirubin is 2.4.  He did start radiation therapy.  I am so happy that Radiation Oncology was able to get to him so quickly.  His CBC shows a white cell count 6.2.  Hemoglobin 8.8.  Platelet count 93,000.  I am not too worried about the platelet count given that he does have a gram-negative rod bacteremia.  He has had no nausea or vomiting.  There is been no bleeding.  I am a little bit troubled by his albumin which is quite low.  Hopefully, this can be improved with better oral nutrition.  His vital signs all look pretty stable.  Temperature 98.7.  Pulse 69.  Blood pressure 127/78.  There really is no change in his overall physical exam.  His abdomen is soft.  He does have slightly decreased bowel sounds.  There is no guarding or rebound tenderness.  There is no abdominal mass.  Again, he has been treated for Klebsiella bacteremia.  He seems to be improving clinically.  As expected, he is getting fantastic care from all the staff down in the ICU.  I appreciate all their hard work.  Lattie Haw, MD  1 Cor 1:4

## 2020-09-23 NOTE — H&P (Addendum)
Chief Complaint: Patient was seen in consultation today for a cholangiogram and biliary drain exchange and upsize, possible percutaneous cholecystostomy tube placement  at the request of Dr. Lucianne Muss Dr. Grace Isaac   Referring Physician(s): Dr. Lucianne Muss Dr. Grace Isaac   Supervising Physician: Simonne Come  Patient Status: [facility] - In-pt  History of Present Illness: Jonathan Lewis is a 80 y.o. male with PMH pneumonia, asymptomatic sinus bradycardia, DM, hyperlipidemia, hypertension, anemia, BPH, arthritis, acoustic neuroma, and stage III gastric cancer s/p distal gastrostomy in 03/2019, obstructive jaundice status post failed attempt at ERCP biliary stent placement on 09/09/20, s/p percutaneous biliary drainage placement with IR on 09/10/20. Patient was hospitalized from 09/09/20 to 09/12/20 due to biliary obstruction and presented to ED on 6/18 for an urinary catheter bag replacement , and was found to be febrile , prescribed Keflex empirically for UTI.   Unfortunately, patient again presented to ED on 6/20 due to weakness, was found to be septic and admitted.  Patient CT A/P with contrast due to RUQ abd pain, fever and UTI in the ED which showed:  1. No findings to explain the patient's acute clinical history. 2. Peritoneal/omental metastases, as on 09/10/2020. 3. Percutaneous biliary drain in place with similar gallbladder and biliary ductal dilatation. Associated obstructing mass seen on 09/04/2020 is better seen on that study. 4. Pancreatic body mass, also better seen on 09/04/2020. 5. Right renal stone.  Subsequent ultrasound of right upper quadrant showed:  Nonspecific gallbladder wall edema. No sonographic evidence of acute cholecystitis.  Intrahepatic duct dilatation. Common bile duct containing echogenic material that may reflect sludge.  IR was consulted by [facility] and oncology for possible biliary drain evaluation and exchange on 09/22/2020, no IR intervention was done at the time of initial  consultation as the existing biliary drain was working well and the patient showed significant improvement including negative PE exam, stable vital sign, trending down WBC, and trending down total bili.  Cholangiogram prior to discharge to evaluate the existing biliary drain was recommended by IR.   Dr. Grace Isaac reviewed CT and ultrasound this morning and recommends a cholangiogram with biliary drainage exchange and upsizing and possible percutaneous cholecystostomy tube placement to be done tomorrow, 09/24/2020.   Patient laying in bed, not in acute distress. Patient's wife at the bedside.  Reports chronic productive cough and small amount of bright red blood with BM occasionally, which has been "going for a while, my doctors know about it" per patient.  Denise headache, fever, chills, shortness of breath, cough, chest pain, abdominal pain, nausea ,vomiting.   Past Medical History:  Diagnosis Date   Acoustic neuroma (HCC) 06/25/2015   Allergic rhinitis 07/18/2014   Allergy    Anemia    Arthritis    BPH (benign prostatic hyperplasia)    Cancer (HCC)    stage III stomach-Dx 03/2019   Carbuncle    recurrent MRSA carbuncles   Cataract    Per pt bilateral cataracts removed.   Diabetes mellitus    Type II. Per pt Dr. Jonny Ruiz took him off his dm med.   Disk prolapse    Glaucoma    Hyperlipidemia    Hypertension    Male hypogonadism 07/16/2014   Neuropathy    Pneumonia    Sinus bradycardia    chronic, asymptomatic   Sinusitis 07/29/2012    Past Surgical History:  Procedure Laterality Date   ANTERIOR CERVICAL DECOMPRESSION/DISCECTOMY FUSION 4 LEVELS N/A 01/08/2020   Procedure: ANTERIOR CERVICAL DECOMPRESSION FUSION CERVICAL 3-4, CERVICAL 4-5, CERVIAL  5-6 WITH INSTRUMENTATION AND ALLOGRAFT;  Surgeon: Phylliss Bob, MD;  Location: [facility];  Service: Orthopedics;  Laterality: N/A;   BALLOON DILATION N/A 03/12/2019   Procedure: BALLOON DILATION;  Surgeon: Rush Landmark Telford Nab., MD;  Location: [facility];  Service: Gastroenterology;  Laterality: N/A;  pyloric   BIOPSY  03/08/2019   Procedure: BIOPSY;  Surgeon: Yetta Flock, MD;  Location: [facility];  Service: Gastroenterology;;   BIOPSY  03/12/2019   Procedure: BIOPSY;  Surgeon: Irving Copas., MD;  Location: [facility];  Service: Gastroenterology;;   CATARACT EXTRACTION     x 2   COLONOSCOPY     ENDOSCOPIC RETROGRADE CHOLANGIOPANCREATOGRAPHY (ERCP) WITH PROPOFOL N/A 09/09/2020   Procedure: ENDOSCOPIC RETROGRADE CHOLANGIOPANCREATOGRAPHY (ERCP) WITH PROPOFOL;  Surgeon: Irving Copas., MD;  Location: [facility];  Service: Gastroenterology;  Laterality: N/A;   ESOPHAGOGASTRODUODENOSCOPY N/A 03/08/2019   Procedure: ESOPHAGOGASTRODUODENOSCOPY (EGD);  Surgeon: Yetta Flock, MD;  Location: [facility];  Service: Gastroenterology;  Laterality: N/A;   ESOPHAGOGASTRODUODENOSCOPY (EGD) WITH PROPOFOL N/A 03/12/2019   Procedure: ESOPHAGOGASTRODUODENOSCOPY (EGD) WITH PROPOFOL;  Surgeon: Rush Landmark Telford Nab., MD;  [facility];  Service: Gastroenterology;  Laterality: N/A;   FINE NEEDLE ASPIRATION  03/12/2019   Procedure: FINE NEEDLE ASPIRATION (FNA) LINEAR;  Surgeon: Irving Copas., MD;  [facility]: [facility];  Service: Gastroenterology;;   FOREIGN BODY REMOVAL  03/08/2019   Procedure: FOREIGN BODY REMOVAL;  Surgeon: Yetta Flock, MD;  [facility];  Service: Gastroenterology;;   IR BILIARY DRAIN PLACEMENT WITH CHOLANGIOGRAM  09/09/2020   IR INT EXT BILIARY DRAIN WITH CHOLANGIOGRAM  09/10/2020   IR PATIENT EVAL TECH 0-60 MINS  04/03/2019   LAPAROTOMY N/A 03/19/2019   Procedure: EXPLORATORY LAPAROTOMY, distal GASTRECTOMY AND PLACEMENT OF G AND J TUBE, gastric jejunostomy;  Surgeon: Greer Pickerel, MD;  Location: [facility];  Service: General;  Laterality: N/A;   POLYPECTOMY     PORTACATH PLACEMENT N/A 03/26/2019   Procedure: INSERTION PORT-A-CATH;  Surgeon: Michael Boston, MD;   Location: [facility];  Service: General;  Laterality: N/A;   [place] INJECTION  09/09/2020   Procedure: SUBMUCOSAL TATTOO INJECTION;  Surgeon: Irving Copas., MD;  Location: [facility];  Service: Gastroenterology;;   UPPER ESOPHAGEAL ENDOSCOPIC ULTRASOUND (EUS) N/A 03/12/2019   Procedure: UPPER ESOPHAGEAL ENDOSCOPIC ULTRASOUND (EUS);  Surgeon: Irving Copas., MD;  Location: [facility];  Service: Gastroenterology;  Laterality: N/A;   UPPER GASTROINTESTINAL ENDOSCOPY     VIDEO BRONCHOSCOPY WITH ENDOBRONCHIAL ULTRASOUND N/A 05/22/2019   Procedure: VIDEO BRONCHOSCOPY WITH ENDOBRONCHIAL ULTRASOUND;  Surgeon: Garner Nash, DO;  Location: [facility];  Service: Thoracic;  Laterality: N/A;    Allergies: Patient has no known allergies.  Medications: Prior to Admission medications   Medication Sig Start Date End Date Taking? Authorizing Provider  acetaminophen (TYLENOL) 160 MG/5ML liquid Take 500 mg by mouth every 4 (four) hours as needed for fever.   Yes [provider]  atorvastatin (LIPITOR) 20 MG tablet Take 1 tablet (20 mg total) by mouth daily. Patient taking differently: Take 20 mg by mouth in the morning. 04/26/20 04/26/21 Yes Biagio Borg, MD  cephALEXin (KEFLEX) 500 MG capsule Take 1 capsule (500 mg total) by mouth 4 (four) times daily. Patient taking differently: Take 500 mg by mouth 4 (four) times daily. Start date : 09/20/20 09/19/20  Yes Sherrill Raring, PA-C  dorzolamide-timolol (COSOPT) 22.3-6.8 MG/ML ophthalmic solution Place 1 drop into both eyes 2 (two) times daily.  06/15/20  Yes [provider]  famotidine (PEPCID) 20 MG tablet TAKE 1 TABLET BY MOUTH EVERY DAY Patient taking differently: Take 20 mg by mouth daily. 09/10/20  Yes Ennever, Rudell Cobb, MD  latanoprost (XALATAN) 0.005 % ophthalmic solution Place 1 drop into both eyes at bedtime.  01/09/19  Yes [provider]  lidocaine-prilocaine (EMLA) cream Apply 1 application  topically as needed. Place on the port one hour before appointment. Patient taking differently: Apply 1 application topically daily as needed (port access). Place on the port one hour before appointment. 08/24/20  Yes Volanda Napoleon, MD  meclizine (ANTIVERT) 12.5 MG tablet TAKE 1 TABLET BY MOUTH 3 TIMES DAILY AS NEEDED FOR DIZZINESS. Patient taking differently: Take 12.5 mg by mouth daily as needed for dizziness. 08/10/20  Yes Biagio Borg, MD  Multiple Vitamin (MULTIVITAMIN WITH MINERALS) TABS tablet Take 1 tablet by mouth in the morning. Men's One-A-Day Chewable   Yes [provider]  ondansetron (ZOFRAN) 8 MG tablet Take 1 tablet (8 mg total) by mouth every 8 (eight) hours as needed for nausea or vomiting. 09/16/20  Yes Hayden Pedro, PA-C  senna-docusate (SENOKOT S) 8.6-50 MG tablet Take 1 tablet by mouth daily. 09/12/20  Yes Domenic Polite, MD  SYSTANE ULTRA 0.4-0.3 % SOLN Place 1 drop into both eyes 3 (three) times daily as needed (dry eyes). 01/09/19  Yes [provider]  traMADol (ULTRAM) 50 MG tablet Take 2 tablets (100 mg total) by mouth at bedtime as needed for moderate pain or severe pain. 04/22/20  Yes Biagio Borg, MD     Family History  Problem Relation Age of Onset   Cancer Mother    Hypertension Mother    Diabetes Sister    Cancer Brother        colon   Hypertension Brother    Hyperlipidemia Brother    Stroke Brother    Hypertension Sister    Colon cancer Neg Hx    Heart attack Neg Hx    Sudden death Neg Hx    Esophageal cancer Neg Hx    Pancreatic cancer Neg Hx    Stomach cancer Neg Hx    Rectal cancer Neg Hx     Social History   Socioeconomic History   Marital status: Married    Spouse name: Not on file   Number of children: 7   Years of education: 7   Highest education level: Not on file  Occupational History   Occupation: Mining engineer: RETIRED  Tobacco Use   Smoking status: Former    Packs/day: 1.00    Years: 24.00     Pack years: 24.00    Types: Cigarettes    Quit date: 10/03/1978    Years since quitting: 42.0   Smokeless tobacco: Never  Vaping Use   Vaping Use: Never used  Substance and Sexual Activity   Alcohol use: Not Currently   Drug use: No   Sexual activity: Yes    Partners: Female  Other Topics Concern   Not on file  Social History Narrative   9th grade.  Married '60. [name] 3 sons -'56 '64, '66; 4 dtrs -'60, '62, '61, '65; 15 grands, 25 great-grands.   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   ACD- discussed living will and HCPOA (July '14) provided packet.          Social Determinants of Health   Financial Resource Strain: Not on file  Food Insecurity:  Not on file  Transportation Needs: Not on file  Physical Activity: Not on file  Stress: Not on file  Social Connections: Not on file     Review of Systems: A 12 point ROS discussed and pertinent positives are indicated in the HPI above.  All other systems are negative.   Vital Signs: BP 138/84   Pulse 67   Temp 97.9 F (36.6 C)   Resp 15   SpO2 100%   Physical Exam Vitals reviewed.  Constitutional:      General: He is not in acute distress.    Appearance: He is ill-appearing.  HENT:     Head: Normocephalic and atraumatic.     Mouth/Throat:     Mouth: Mucous membranes are moist.  Cardiovascular:     Rate and Rhythm: Normal rate and regular rhythm.     Pulses: Normal pulses.     Heart sounds: Normal heart sounds.  Pulmonary:     Effort: Pulmonary effort is normal.     Breath sounds: Normal breath sounds.  Abdominal:     General: Abdomen is flat. Bowel sounds are normal.     Palpations: Abdomen is soft.  Musculoskeletal:     Cervical back: Neck supple.  Skin:    General: Skin is warm and dry.     Coloration: Skin is not jaundiced or pale.     Comments: Positive for RUQ drain.  Neurological:     Mental Status: He is alert and oriented to person, place, and time.  Psychiatric:        Mood and  Affect: Mood normal.        Behavior: Behavior normal.        Judgment: Judgment normal.    MD Evaluation Airway: WNL Heart: WNL Abdomen: WNL Chest/ Lungs: WNL ASA  Classification: 3 Mallampati/Airway Score: Two  Imaging: DG Chest 2 View  Result Date: 09/21/2020 CLINICAL DATA:  Weakness and fever for 2 weeks. EXAM: CHEST - 2 VIEW COMPARISON:  03/26/2019 FINDINGS: The cardiomediastinal silhouette is unremarkable. Mild peribronchial thickening again noted. A RIGHT Port-A-Cath is again noted with tip overlying the SUPERIOR cavoatrial junction. There is no evidence of focal airspace disease, pulmonary edema, suspicious pulmonary nodule/mass, pleural effusion, or pneumothorax. No acute bony abnormalities are identified. IMPRESSION: No active cardiopulmonary disease. Electronically Signed   By: Margarette Canada M.D.   On: 09/21/2020 12:23   CT ABDOMEN PELVIS W CONTRAST  Result Date: 09/11/2020 CLINICAL DATA:  History of gastric cancer, now with elevated bilirubin post failed attempt at endoscopic biliary stent placement as well as percutaneous biliary drainage catheter placement. Please perform abdominal CT for potential repeat attempt at internal/external biliary drainage catheter placement. EXAM: CT ABDOMEN AND PELVIS WITH CONTRAST TECHNIQUE: Multidetector CT imaging of the abdomen and pelvis was performed using the standard protocol following bolus administration of intravenous contrast. CONTRAST:  138mL OMNIPAQUE IOHEXOL 300 MG/ML  SOLN COMPARISON:  MRCP-09/04/2020; ERCP-09/09/2020; PET-CT-06/25/2020 FINDINGS: Lower chest: Limited visualization of the lower thorax demonstrates minimal dependent subpleural ground-glass atelectasis, right greater than left. There is a punctate granuloma within the right middle lobe. No discrete focal airspace opacities. Borderline cardiomegaly. No pericardial effusion. Port a catheter tip terminates within the superior cavoatrial junction. Hepatobiliary: Redemonstrated  marked distension of the gallbladder with very minimal intrahepatic biliary ductal dilatation, similar to abdominal MRCP performed 09/04/2020. No definitive gallbladder wall thickening or pericholecystic stranding. Scattered foci air at the level of the porta hepatis and minimal amount of ill-defined stranding adjacent to the  anterior aspect the left lobe of the liver and right lateral body wall, the result recent attempted percutaneous biliary drainage catheter placement. Approximately 1.2 cm nodule adjacent to the anterior aspect the lobe liver image 11, series 7 similar to recent abdominal MRI and again favored to represent a hepatic capsular implant. There is a punctate (approximately 0.6 cm) hypoattenuating lesion with the dome of the right lobe of the liver (image 11, series 2, which is too small to accurately characterize favored to represent a hepatic cyst. Additional subcentimeter hypoattenuating hepatic lesions (images 21 and 31, series 2) too small to accurately characterize remain indeterminate. Pancreas: Previously question hypovascular involving the pancreatic body on recent abdominal MRI is not well demonstrated the present examination. No definitive downstream pancreatic ductal dilatation or peripancreatic stranding. Spleen: Normal appearance of the spleen. Adrenals/Urinary Tract: There is symmetric enhancement and excretion of the bilateral kidneys. There is a punctate (approximately 5 mm) nonobstructing stone within the inferior pole the right kidney image 34, series 2). No discrete left-sided renal stones on this postcontrast examination. No renal stones are seen along expected course of either ureter or the urinary bladder. Mild thickening of the urinary bladder wall, potentially accentuated due to underdistention. Redemonstrated small left-sided extrarenal pelvis. No evidence of urinary obstruction. Normal appearance the bilateral adrenal glands. Stomach/Bowel: Stable postoperative change the  stomach compatible with history partial gastric resection. Moderate colonic stool burden without evidence of enteric obstruction. Scattered colonic diverticulosis without evidence of superimposed acute diverticulitis. Normal appearance of the terminal ileum and the retrocecal appendix. No discrete areas of bowel wall thickening. No pneumoperitoneum, pneumatosis or portal venous gas. Vascular/Lymphatic: Normal caliber of the aorta. There is a very minimal amount of involving the bilateral common iliac and femoral arteries, not resulting in hemodynamically significant stenosis. The major branch vessels the aorta appear patent on this non CTA examination. No bulky retroperitoneal, pelvic or inguinal lymphadenopathy. Reproductive: Dystrophic calcifications within a borderline enlarged prostate gland. No free fluid the pelvic cul-de-sac. Other: Redemonstrated scattered peritoneal nodules with dominant nodule within the right upper abdominal quadrant measuring 1.4 x 1.1 cm (image 38, series 2). Additional peritoneal nodules are seen on images 27, 38, 43, 46 and 47, series 2. Musculoskeletal: No acute or aggressive osseous abnormalities. Redemonstrated bilateral L5 pars defects associated anterolisthesis. Mild-to-moderate multilevel lumbar spine DDD, worse at L4-L5 with disc space height loss, endplate irregularity and posterior directed osteophytosis. Mild degenerative change the hips with joint space loss, subchondral sclerosis and osteophytosis. No evidence. IMPRESSION: 1. Marked distension of the gallbladder with minimal intrahepatic biliary ductal dilatation, similar to MRCP performed 09/04/2020. 2. Redemonstrated suspected peritoneal metastatic disease including a subcapsular implant adjacent to the left lobe of the liver. 3. Previously questioned pancreatic head mass is not well demonstrated on the present examination though resolution should not assumed on the basis of this examination. 4. Similar appearing  indeterminate hepatic lesions at least one of which was found to be hypermetabolic on PET-CT performed 06/25/2020. 5. Solitary punctate (5 mm) nonobstructing right-sided renal stone. Electronically Signed   By: Sandi Mariscal M.D.   On: 09/11/2020 08:29   CT CHEST ABDOMEN PELVIS W CONTRAST  Result Date: 09/21/2020 CLINICAL DATA:  Abdominal pain, fever, weakness. Urinary tract infection. Gastrointestinal cancer. EXAM: CT CHEST, ABDOMEN, AND PELVIS WITH CONTRAST TECHNIQUE: Multidetector CT imaging of the chest, abdomen and pelvis was performed following the standard protocol during bolus administration of intravenous contrast. CONTRAST:  141mL OMNIPAQUE IOHEXOL 300 MG/ML  SOLN COMPARISON:  CT abdomen pelvis  09/10/2020 and CT chest 06/12/2020. MR abdomen 09/04/2020 and PET 06/25/2020 FINDINGS: CT CHEST FINDINGS Cardiovascular: Right IJ Port-A-Cath terminates at the SVC RA junction. Heart is at the upper limits of normal in size. No pericardial effusion. Mediastinum/Nodes: Mediastinal lymph nodes measure up to 11 mm in the low right paratracheal station, similar. Calcified mediastinal lymph nodes. No hilar or axillary adenopathy. Esophagus is grossly. Lungs/Pleura: Centrilobular and paraseptal emphysema. Calcified granulomas. Minimal dependent atelectasis bilaterally. No pleural fluid. Adherent debris in the right mainstem bronchus. Musculoskeletal: Degenerative changes in the spine. No worrisome lytic or sclerotic lesions. CT ABDOMEN PELVIS FINDINGS Hepatobiliary: Similar biliary ductal dilatation with a percutaneous biliary drainage catheter in place, terminating in the duodenum. Associated centrally obstructing mass, better seen on 09/04/2020. 9 mm low-attenuation lesion in the periphery of segment 4 (2/65), unchanged and too small to characterize. Other subcapsular metastases seen on 09/03/2020 are not readily appreciated. Gallbladder is dilated. Pancreas: Mass seen in the pancreatic body on 09/04/2020 is poorly  appreciated. Spleen: Negative. Adrenals/Urinary Tract: Adrenal glands are unremarkable. Right renal stone. Kidneys are otherwise unremarkable. Ureters are decompressed. Bladder may be minimally thick-walled. Stomach/Bowel: Distal gastrectomy. Stomach, small bowel and appendix are otherwise unremarkable. Fluid seen in the colon. Vascular/Lymphatic: Vascular structures are unremarkable. No pathologically enlarged lymph nodes. Reproductive: Prostate is visualized. Other: Bilateral omental nodules measure up to 1.4 cm, as on 09/10/2020. Mesenteric nodules measure up to 8 mm in short axis (2/86). No free fluid. Left inguinal hernia contains fat. Musculoskeletal: Degenerative changes in the spine. IMPRESSION: 1. No findings to explain the patient's acute clinical history. 2. Peritoneal/omental metastases, as on 09/10/2020. 3. Percutaneous biliary drain in place with similar gallbladder and biliary ductal dilatation. Associated obstructing mass seen on 09/04/2020 is better seen on that study. 4. Pancreatic body mass, also better seen on 09/04/2020. 5. Right renal stone. Electronically Signed   By: Lorin Picket M.D.   On: 09/21/2020 14:47   MR 3D Recon At Scanner  Result Date: 09/04/2020 CLINICAL DATA:  New onset jaundice.  Metastatic gastric carcinoma. EXAM: MRI ABDOMEN WITHOUT AND WITH CONTRAST (INCLUDING MRCP) TECHNIQUE: Multiplanar multisequence MR imaging of the abdomen was performed both before and after the administration of intravenous contrast. Heavily T2-weighted images of the biliary and pancreatic ducts were obtained, and three-dimensional MRCP images were rendered by post processing. CONTRAST:  73mL GADAVIST GADOBUTROL 1 MMOL/ML IV SOLN COMPARISON:  CT on 06/12/2020 FINDINGS: Lower chest: No acute findings. Hepatobiliary: No intrahepatic masses are seen, however there are a few sub-cm enhancing soft tissue nodules along the capsular surface of the left hepatic lobe consistent with peritoneal metastases.  The gallbladder is distended and diffuse intra and extra hepatic biliary ductal dilatation is now seen, with common bile duct measuring 9 mm. Obstruction of the common bile duct is seen in the porta hepatis, due to an irregular soft tissue mass measuring approximately 4 x 3 cm on image 45/41. This most likely represents metastatic lymphadenopathy. Pancreas: A subtle hypovascular mass is seen in the pancreatic body which measures 2.3 x 1.9 cm on image 47/27. This may represent a pancreatic metastasis or adjacent peripancreatic lymphadenopathy. Spleen:  Within normal limits in size and appearance. Adrenals/Urinary Tract: No masses identified. No evidence of hydronephrosis. Stomach/Bowel: Prior gastric jejunostomy again noted. Otherwise unremarkable. Vascular/Lymphatic: See above findings in hepatobiliary and pancreas sections. Other: Multiple small enhancing peritoneal and omental soft tissue nodules are again seen in the anterior right and left upper quadrants, consistent with peritoneal metastases. Musculoskeletal:  No suspicious  bone lesions identified. IMPRESSION: New diffuse biliary ductal dilatation due to soft tissue mass in the porta hepatis adjacent to pancreatic head. This most likely represents metastatic lymphadenopathy. Small mass in the pancreatic body, most likely representing pancreatic metastasis or adjacent peripancreatic lymphadenopathy. Multiple sub-cm enhancing soft tissue nodules along the capsular surface of the left hepatic lobe and bilateral upper quadrant omental fat, consistent with peritoneal metastases. Electronically Signed   By: Marlaine Hind M.D.   On: 09/04/2020 21:06   DG ERCP  Result Date: 09/09/2020 CLINICAL DATA:  Attempted ERCP EXAM: ERCP TECHNIQUE: Multiple spot images obtained with the fluoroscopic device and submitted for interpretation post-procedure. FLUOROSCOPY TIME:  Fluoroscopy Time:  3 minutes 38 seconds Number of Acquired Spot Images: 0 COMPARISON:  MRCP 09/04/2020  FINDINGS: A total of 20 intraoperative saved images are submitted for review. The images demonstrate a flexible duodenal scope in various positions within the duodenum and proximal jejunum. No evidence of immediate complication. IMPRESSION: Attempted ERCP. These images were submitted for radiologic interpretation only. Please see the procedural report for the amount of contrast and the fluoroscopy time utilized. Electronically Signed   By: Jacqulynn Cadet M.D.   On: 09/09/2020 14:47   MR ABDOMEN MRCP W WO CONTAST  Result Date: 09/04/2020 CLINICAL DATA:  New onset jaundice.  Metastatic gastric carcinoma. EXAM: MRI ABDOMEN WITHOUT AND WITH CONTRAST (INCLUDING MRCP) TECHNIQUE: Multiplanar multisequence MR imaging of the abdomen was performed both before and after the administration of intravenous contrast. Heavily T2-weighted images of the biliary and pancreatic ducts were obtained, and three-dimensional MRCP images were rendered by post processing. CONTRAST:  46mL GADAVIST GADOBUTROL 1 MMOL/ML IV SOLN COMPARISON:  CT on 06/12/2020 FINDINGS: Lower chest: No acute findings. Hepatobiliary: No intrahepatic masses are seen, however there are a few sub-cm enhancing soft tissue nodules along the capsular surface of the left hepatic lobe consistent with peritoneal metastases. The gallbladder is distended and diffuse intra and extra hepatic biliary ductal dilatation is now seen, with common bile duct measuring 9 mm. Obstruction of the common bile duct is seen in the porta hepatis, due to an irregular soft tissue mass measuring approximately 4 x 3 cm on image 45/41. This most likely represents metastatic lymphadenopathy. Pancreas: A subtle hypovascular mass is seen in the pancreatic body which measures 2.3 x 1.9 cm on image 47/27. This may represent a pancreatic metastasis or adjacent peripancreatic lymphadenopathy. Spleen:  Within normal limits in size and appearance. Adrenals/Urinary Tract: No masses identified. No  evidence of hydronephrosis. Stomach/Bowel: Prior gastric jejunostomy again noted. Otherwise unremarkable. Vascular/Lymphatic: See above findings in hepatobiliary and pancreas sections. Other: Multiple small enhancing peritoneal and omental soft tissue nodules are again seen in the anterior right and left upper quadrants, consistent with peritoneal metastases. Musculoskeletal:  No suspicious bone lesions identified. IMPRESSION: New diffuse biliary ductal dilatation due to soft tissue mass in the porta hepatis adjacent to pancreatic head. This most likely represents metastatic lymphadenopathy. Small mass in the pancreatic body, most likely representing pancreatic metastasis or adjacent peripancreatic lymphadenopathy. Multiple sub-cm enhancing soft tissue nodules along the capsular surface of the left hepatic lobe and bilateral upper quadrant omental fat, consistent with peritoneal metastases. Electronically Signed   By: Marlaine Hind M.D.   On: 09/04/2020 21:06   ECHOCARDIOGRAM COMPLETE  Result Date: 09/22/2020    ECHOCARDIOGRAM REPORT   Patient Name:   Jonathan Lewis Date of Exam: 09/22/2020 Medical Rec #:  [id number]        Height:  68.0 in Accession #:    [id number]       Weight:       174.4 lb Date of Birth:  12-14-1940        BSA:          1.928 m Patient Age:    22 years         BP:           116/82 mmHg Patient Gender: M                HR:           66 bpm. Exam Location:  Inpatient Procedure: 2D Echo, Color Doppler and Cardiac Doppler Indications:    Bacteremia  History:        Patient has prior history of Echocardiogram examinations, most                 recent 05/06/2015. Risk Factors:Diabetes, Hypertension and                 Dyslipidemia.  Sonographer:    Bernadene Person RDCS Referring Phys: [facility]  1. Left ventricular ejection fraction, by estimation, is 40 to 45%. The left ventricle has mildly decreased function. The left ventricle demonstrates global hypokinesis. Left  ventricular diastolic parameters are consistent with Grade I diastolic dysfunction (impaired relaxation).  2. Right ventricular systolic function is normal. The right ventricular size is normal. Tricuspid regurgitation signal is inadequate for assessing PA pressure.  3. The mitral valve is normal in structure. Mild mitral valve regurgitation. No evidence of mitral stenosis.  4. The aortic valve is tricuspid. Aortic valve regurgitation is not visualized. No aortic stenosis is present.  5. Aortic dilatation noted. There is borderline dilatation of the aortic root, measuring 39 mm.  6. The inferior vena cava is normal in size with greater than 50% respiratory variability, suggesting right atrial pressure of 3 mmHg. FINDINGS  Left Ventricle: Left ventricular ejection fraction, by estimation, is 40 to 45%. The left ventricle has mildly decreased function. The left ventricle demonstrates global hypokinesis. The left ventricular internal cavity size was normal in size. There is  no left ventricular hypertrophy. Left ventricular diastolic parameters are consistent with Grade I diastolic dysfunction (impaired relaxation). Right Ventricle: The right ventricular size is normal. Right ventricular systolic function is normal. Tricuspid regurgitation signal is inadequate for assessing PA pressure. The tricuspid regurgitant velocity is 1.44 m/s, and with an assumed right atrial  pressure of 3 mmHg, the estimated right ventricular systolic pressure is 35.0 mmHg. Left Atrium: Left atrial size was normal in size. Right Atrium: Right atrial size was normal in size. Pericardium: There is no evidence of pericardial effusion. Mitral Valve: The mitral valve is normal in structure. Mild mitral valve regurgitation. No evidence of mitral valve stenosis. Tricuspid Valve: The tricuspid valve is normal in structure. Tricuspid valve regurgitation is trivial. No evidence of tricuspid stenosis. Aortic Valve: The aortic valve is tricuspid. Aortic  valve regurgitation is not visualized. No aortic stenosis is present. Pulmonic Valve: The pulmonic valve was grossly normal. Pulmonic valve regurgitation is not visualized. No evidence of pulmonic stenosis. Aorta: Aortic dilatation noted. There is borderline dilatation of the aortic root, measuring 39 mm. Venous: The inferior vena cava is normal in size with greater than 50% respiratory variability, suggesting right atrial pressure of 3 mmHg. IAS/Shunts: No atrial level shunt detected by color flow Doppler.  LEFT VENTRICLE PLAX 2D LVIDd:  4.70 cm      Diastology LVIDs:         3.70 cm      LV e' medial:    7.38 cm/s LV PW:         0.70 cm      LV E/e' medial:  7.2 LV IVS:        0.80 cm      LV e' lateral:   7.35 cm/s LVOT diam:     2.10 cm      LV E/e' lateral: 7.3 LV SV:         57 LV SV Index:   29 LVOT Area:     3.46 cm  LV Volumes (MOD) LV vol d, MOD A2C: 118.0 ml LV vol d, MOD A4C: 109.0 ml LV vol s, MOD A2C: 60.7 ml LV vol s, MOD A4C: 43.6 ml LV SV MOD A2C:     57.3 ml LV SV MOD A4C:     109.0 ml LV SV MOD BP:      64.1 ml RIGHT VENTRICLE RV S prime:     7.90 cm/s TAPSE (M-mode): 1.9 cm LEFT ATRIUM             Index       RIGHT ATRIUM           Index LA diam:        2.50 cm 1.30 cm/m  RA Area:     17.30 cm LA Vol (A2C):   38.0 ml 19.71 ml/m RA Volume:   50.40 ml  26.14 ml/m LA Vol (A4C):   39.2 ml 20.33 ml/m LA Biplane Vol: 41.5 ml 21.52 ml/m  AORTIC VALVE LVOT Vmax:   74.10 cm/s LVOT Vmean:  46.200 cm/s LVOT VTI:    0.164 m  AORTA Ao Root diam: 3.90 cm Ao Asc diam:  3.40 cm MITRAL VALVE               TRICUSPID VALVE MV Area (PHT): 2.91 cm    TR Peak grad:   8.3 mmHg MV Decel Time: 261 msec    TR Vmax:        144.00 cm/s MV E velocity: 53.30 cm/s MV A velocity: 58.80 cm/s  SHUNTS MV E/A ratio:  0.91        Systemic VTI:  0.16 m                            Systemic Diam: 2.10 cm Kirk Ruths MD Electronically signed by Kirk Ruths MD Signature Date/Time: 09/22/2020/2:10:50 PM    Final    IR  BILIARY DRAIN PLACEMENT WITH CHOLANGIOGRAM  Result Date: 09/10/2020 INDICATION: History of gastric cancer, now with obstructive jaundice, post failed attempt at ERCP biliary stent placement. EXAM: ATTEMPTED THOUGH ULTIMATELY UNSUCCESSFUL ULTRASOUND AND FLUOROSCOPIC GUIDED PERCUTANEOUS TRANSHEPATIC CHOLANGIOGRAM AND BILIARY TUBE PLACEMENT COMPARISON:  MRCP-09/04/2020; PET-CT-06/26/2018 MEDICATIONS: Cefoxitin 2 gm IV; The antibiotic was administered with an appropriate time frame prior to the initiation of the procedure CONTRAST:  45 mL OMNIPAQUE IOHEXOL 300 MG/ML  SOLN ANESTHESIA/SEDATION: Moderate (conscious) sedation was employed during this procedure. A total of Versed 3 mg and Fentanyl 100 mcg was administered intravenously. Moderate Sedation Time: 88 minutes. The patient's level of consciousness and vital signs were monitored continuously by radiology nursing throughout the procedure under my direct supervision. FLUOROSCOPY TIME:  10 minutes, 54 seconds (498 mGy) COMPLICATIONS: None immediate. TECHNIQUE: Informed written consent was obtained from the  patient after a discussion of the risks, benefits and alternatives to treatment. Questions regarding the procedure were encouraged and answered. A timeout was performed prior to the initiation of the procedure. The right upper abdominal quadrant was prepped and draped in the usual sterile fashion, and a sterile drape was applied covering the operative field. Maximum barrier sterile technique with sterile gowns and gloves were used for the procedure. A timeout was performed prior to the initiation of the procedure. Sonographic evaluation was performed the liver demonstrating only a small amount of peripheral and central intrahepatic biliary duct dilatation with marked distension of gallbladder. Prolonged efforts were made to cannulate a peripherally located bile duct within both the right and left lobes of the liver however despite prolonged effort, a bile duct was  unable to be successfully cannulated catheterized either with ultrasound or fluoroscopic guidance secondary to lack of biliary ductal dilatation as well as poor percutaneous window due to interposition the ribs and the costal cartilage. As such the procedure was terminated. Dressings were applied. The patient tolerated the procedure well without immediate postprocedural complication. FINDINGS: Sonographic evaluation demonstrates lack of significant dilatation of either the peripheral or central aspect of the intrahepatic biliary tree and despite prolonged efforts, a bile duct was unable to be successfully opacified and/or cannulated. IMPRESSION: Attempted though ultimately unsuccessful percutaneous cholangiogram and biliary drain placement due to a combination lack significant dilatation the central of peripheral intrahepatic biliary system as well as poor percutaneous window due to interposition of ribs and the costal cartilage. PLAN: Will recheck patient's bilirubin level tomorrow and will discuss with the providing service the appropriateness of proceeding with repeat attempt versus cholecystostomy tube placement as indicated. Electronically Signed   By: Sandi Mariscal M.D.   On: 09/10/2020 09:57   IR INT EXT BILIARY DRAIN WITH CHOLANGIOGRAM  Result Date: 09/10/2020 INDICATION: History of gastric cancer, now with obstructive jaundice, post failed attempt at ERCP biliary stent placement (due to surgical anatomy) as well as failed attempt percutaneous biliary drainage placement yesterday. As such, patient presents today for repeat attempt at internal/external biliary drainage catheter placement EXAM: ULTRASOUND AND FLUOROSCOPIC GUIDED PERCUTANEOUS TRANSHEPATIC CHOLANGIOGRAM AND BILIARY TUBE PLACEMENT COMPARISON:  Attempted internal/external biliary drainage catheter placement-09/09/2020 CT abdomen and pelvis-earlier same day MRCP-09/04/2020 PET-CT-06/25/2020 MEDICATIONS: Cefoxitin 2 gm IV; The antibiotic was  administered with an appropriate time frame prior to the initiation of the procedure CONTRAST:  44mL OMNIPAQUE IOHEXOL 300 MG/ML SOLN - administered into the biliary tree. ANESTHESIA/SEDATION: Moderate (conscious) sedation was employed during this procedure. A total of Versed 2 mg and Fentanyl 100 mcg was administered intravenously. Moderate Sedation Time: 28 minutes. The patient's level of consciousness and vital signs were monitored continuously by radiology nursing throughout the procedure under my direct supervision. FLUOROSCOPY TIME:  4 minutes, 42 seconds (219 mGy close COMPLICATIONS: None immediate. TECHNIQUE: Informed written consent was obtained from the patient after a discussion of the risks, benefits and alternatives to treatment. Questions regarding the procedure were encouraged and answered. A timeout was performed prior to the initiation of the procedure. The right upper abdominal quadrant was prepped and draped in the usual sterile fashion, and a sterile drape was applied covering the operative field. Maximum barrier sterile technique with sterile gowns and gloves were used for the procedure. A timeout was performed prior to the initiation of the procedure. Exhaustive sonographic evaluation performed of the liver by the dictating interventional radiologist ultimately delineating a mildly dilated duct within the peripheral aspect the left lobe of  the liver. After the overlying soft tissues were anesthetized with 1% Lidocaine with epinephrine, under direct ultrasound guidance, a 22 gauge Chiba needle was utilized to cannulate the peripheral aspect of the targeted left intrahepatic biliary duct. Appropriate position was confirmed with limited contrast injection. Next, the duct was cannulated with a Nitrex wire and dilated with an Accustick set under fluoroscopic guidance. Limited cholangiograms were performed in various obliquities confirming appropriate access. Nitrex wire was advanced to the level of  the duodenum and exchanged over a 4 French angled glide catheter for a Amplatz wire. Under intermittent fluoroscopic guidance the track was dilated ultimately allowing placement of a 10 [place] biliary drainage catheter with coil ultimately locked within the duodenum. Contrast was injected and a completion radiographs were obtained in various obliquities. The catheter was connected to a drainage bag which yielded the brisk return of clear bile. The catheter was secured to the skin with an interrupted suture and StatLock device. Dressings were applied. The patient tolerated the procedure well without immediate postprocedural complication. FINDINGS: Exhaustive sonographic evaluation performed by the dictating interventional radiologist ultimately demonstrated mildly dilated duct within the peripheral aspect of the left lobe of the liver which was targeted under direct ultrasound guidance, ultimately allowing fluoroscopic guided placement of a 10 French percutaneous drainage catheter with end ultimately coiled and locked within the duodenum and radiopaque side marker located proximal to the level of the biliary hilum. Contrast injections demonstrate malignant narrowing/subtotal occlusion involving the mid and distal aspects of the CBD just caudal to the take-off of the cystic duct as was demonstrated on preceding MRCP. Biliary drainage catheter demonstrates opacification both the right and left intrahepatic biliary trees. IMPRESSION: Successful placement of a 10.2 French percutaneous biliary drainage catheter via left-sided approach with end coiled and locked within the duodenum. PLAN: - Recommend obtaining daily CMPs while the patient is admitted to the hospital. - Maintain the percutaneous biliary drainage catheter to external drainage until the patient's bilirubin has reached a nadir. - Once the patient's bilirubin has reached a nadir, the patient may return for definitive percutaneous cholangiogram and potential  initiation of a capping trial as indicated. Note, ultimately, the patient may be a candidate for biliary stent placement as indicated. Electronically Signed   By: Sandi Mariscal M.D.   On: 09/10/2020 17:12   US ABDOMEN LIMITED RUQ (LIVER/GB)  Result Date: 09/21/2020 CLINICAL DATA:  Fever EXAM: ULTRASOUND ABDOMEN LIMITED RIGHT UPPER QUADRANT COMPARISON:  None. FINDINGS: Gallbladder: No gallstones. Gallbladder wall edema. Minor pericholecystic fluid. No sonographic Murphy sign noted by sonographer. Common bile duct: Diameter: Not well visualized, measuring 6 mm. Echogenic material may reflect sludge. A percutaneous biliary drain is noted on the prior CT. Liver: No focal lesion identified. Intrahepatic duct dilatation. Within normal limits in parenchymal echogenicity. Portal vein is patent on color Doppler imaging with normal direction of blood flow towards the liver. Other: None. IMPRESSION: Nonspecific gallbladder wall edema. No sonographic evidence of acute cholecystitis. Intrahepatic duct dilatation. Common bile duct containing echogenic material that may reflect sludge. Electronically Signed   By: Macy Mis M.D.   On: 09/21/2020 20:35    Labs:  CBC: Recent Labs    09/14/20 1200 09/21/20 1126 09/22/20 0414 09/23/20 0506  WBC 5.6 12.7* 10.0 6.2  HGB 10.9* 9.7* 9.2* 8.8*  HCT 32.9* 28.4* 26.9* 26.1*  PLT 197 155 105* 93*    COAGS: Recent Labs    01/06/20 0915 09/09/20 1900 09/21/20 1126 09/22/20 0414  INR 1.0 1.0 1.6* 1.8*  APTT 29  --  35  --     BMP: Recent Labs    10/30/19 0900 11/20/19 0900 12/11/19 1000 12/31/19 1056 02/18/20 1025 09/21/20 1126 09/21/20 2020 09/22/20 0414 09/23/20 0506  NA 141 139 141 140   < > 123* 130* 132* 134*  K 3.5 4.0 4.0 4.0   < > 3.5 3.4* 3.6 4.0  CL 108 104 107 106   < > 94* 105 108 109  CO2 $Re'27 30 29 28   'Zye$ < > 19* 20* 21* 20*  GLUCOSE 173* 103* 105* 127*   < > 152* 130* 151* 103*  BUN $Re'12 18 13 16   'bEW$ < > $R'22 18 17 16  'GX$ CALCIUM 9.2 9.8 9.4  9.4   < > 7.8* 7.3* 7.2* 7.4*  CREATININE 0.76 0.90 0.83 0.87   < > 0.96 0.96 0.81 0.67  GFRNONAA >60 >60 >60 >60   < > >60 >60 >60 >60  GFRAA >60 >60 >60 >60  --   --   --   --   --    < > = values in this interval not displayed.    LIVER FUNCTION TESTS: Recent Labs    09/14/20 1200 09/21/20 1126 09/22/20 0414 09/23/20 0506  BILITOT 4.7* 3.6* 3.3* 2.4*  AST 67* 147* 103* 73*  ALT 119* 95* 74* 61*  ALKPHOS 301* 189* 165* 182*  PROT 6.5 6.2* 5.1* 4.9*  ALBUMIN 3.7 2.6* 2.1* 2.0*    TUMOR MARKERS: No results for input(s): AFPTM, CEA, CA199, CHROMGRNA in the last 8760 hours.  Assessment and Plan: 80 y.o. male with history of stage III gastric cancer s/p distal gastrostomy in 03/2019, obstructive jaundice status post failed attempt at ERCP biliary stent placement on 09/09/20, s/p percutaneous biliary drainage placement with IR on 09/10/20. The patient was hospitalized from 09/09/2020 to 09/12/2020, unfortunately, represented to ED and was found to be septic.  Patient is currently hospitalized.  IR was consulted by [facility] and oncology for possible biliary drain evaluation and exchange on 09/22/2020, no IR intervention was done at the time of initial consultation as patient showed significant improvement clinically.  A cholangiogram prior to discharge to evaluate existing biliary drain was recommended.   After reviewing the case, Dr. Pascal Lux recommended a cholangiogram with exchange and upsizing of the existing biliary drain and possible percutaneous cholecystotomy tube placement. After thorough discussion, patient and his wife agreeable to proceed.  The procedure is tentatively scheduled for tomorrow pending IR schedule.  N.p.o. at midnight Not on anticoagulation/antiplatelet treatment. INR 1.8 yesterday, trended up by 0.2 from 2 days ago.-- Ordered repeat INR for tomorrow AM.  WBC in normal range, hgb 8.8, plt 93  LFT AST 73, ALT 61, alk phos 182, T bili 2.4  BUN 16, Creatinine 0.67, GFR >60     Risks and benefits discussed with the patient including, but not limited to bleeding, infection, gallbladder perforation, bile leak, sepsis or even death.  All of the patient's questions were answered, patient is agreeable to proceed. Consent signed and in chart.  Thank you for this interesting consult.  I greatly enjoyed meeting CARRSON LIGHTCAP and look forward to participating in their care.  A copy of this report was sent to the requesting provider on this date.  Electronically Signed: Tera Mater, PA-C 09/23/2020, 3:09 PM   I spent a total of 40 Minutes   in face to face in clinical consultation, greater than 50% of which was counseling/coordinating care for a cholangiogram  with exchange and upsizing of the existing biliary drain and possible percutaneous cholecystotomy tube placement.

## 2020-09-23 NOTE — Progress Notes (Signed)
ID brief note  Result Notes  Component 2 d ago   Specimen Description BILE  Performed at [facility], [place] [address] [place], [place] [address]   Special Requests Immunocompromised  Performed at [facility], [place] [address], [place], [place] [address]   Gram Stain NO WBC SEEN  RARE GRAM NEGATIVE RODS   Culture RARE GRAM NEGATIVE RODS  MODERATE YEAST  CULTURE REINCUBATED FOR BETTER GROWTH  Performed at [facility], [place] [address] [place], [place] [address]   Report Status PENDING       Recommendations Continue cefepime Will add fluconazole Following  Rosiland Oz, MD Infectious Disease Physician [facility] [address] [place],  [address] Phone: [phone]  Fax: [phone]

## 2020-09-23 NOTE — Progress Notes (Signed)
PROGRESS NOTE    [name]  JHE:[id number] DOB: September 24, 1940 DOA: 09/21/2020 PCP: Biagio Borg, MD    Brief Narrative:  This 80 years old male with PMH significant for metastatic adenocarcinoma of the stomach, s/p Billroth II, DM, HTN who presented in the ED with generalized weakness, fatigue, less interactive,  not getting out of bed.  Patient is found to have biliary obstruction secondary to metastatic cancer.  Biliary stent was attempted  but failed so IR percutaneous drain was placed.  Of note patient presented 2 days ago for urinary catheter exchange , found to be febrile and was prescribed empirical Keflex for UTI.  Urine cultures no growth.  Patient is admitted for severe sepsis /septic shock requiring Levophed secondary to Klebsiella bacteremia. Source is still considered biliary.  Infectious disease consulted,  started on IV antibiotics.  Patient underwent percutaneous drain which is draining well.  Liver enzymes trending down.  Assessment & Plan:   Principal Problem:   SIRS (systemic inflammatory response syndrome) (HCC) Active Problems:   Diabetes (HCC)   Benign nodular prostatic hyperplasia   Gastric cancer pT3pN2 s/p distal gastrectomy with Billroth II gastrojejunostomy 03/19/2019   Sepsis ([place])   Severe sepsis / Septic shock secondary to Klebsiella bacteremia: Patient presented with hypotension, lactic acid 3.5 and altered mentation. He briefly required Levophed which was discontinued yesterday. Blood cultures positive for Klebsiella aerogenes bacteremia. Infectious disease consulted , thinks likely source is biliary. UA is clean, no urinary symptoms. Port-A-Cath is in place, no redness, no swelling , or pain. De-escalate antibiotics to cefepime.  Follow-up blood culture/ sensitivity. Sepsis physiology improved.  Lactic acid normalized.  Acute metabolic encephalopathy : This could be multifactorial related to hypotension and sepsis CT head unremarkable.   Encephalopathy resolved. Continue IV hydration.  Biliary obstruction secondary to metastatic stomach cancer: S/p percutaneous drain by IR.  Draining well.   IR is planning to cholangiogram and drain capping trial. Bilirubin and LFTs are trending down.  Urinary obstruction: Continue Foley catheter.   Hyponatremia:  Could be likely secondary to dehydration given decreased p.o. intake. Sodium is improving, continue to trend.   Normocytic normochromic anemia: Baseline hemoglobin 10-11. Continue to monitor.  No obvious bleeding.   Adenocarcinoma of the stomach: Oncology is following.   Type 2 diabetes: Continue regular insulin sliding scale.     DVT prophylaxis: SCDs Code Status: DNR Family Communication: No family at bed side. Disposition Plan:   Status is: Inpatient  Remains inpatient appropriate because:Inpatient level of care appropriate due to severity of illness  Dispo: The patient is from: Home              Anticipated d/c is to: SNF              Patient currently is not medically stable to d/c.   Difficult to place patient No  Consultants:  GI IR  Procedures: Percutaneous biliary drain. Antimicrobials:   Anti-infectives (From admission, onward)    Start     Dose/Rate Route Frequency Ordered Stop   09/22/20 1300  vancomycin (VANCOREADY) IVPB 1500 mg/300 mL  Status:  Discontinued        1,500 mg 150 mL/hr over 120 Minutes Intravenous Every 24 hours 09/21/20 1606 09/22/20 0858   09/21/20 2100  ceFEPIme (MAXIPIME) 2 g in sodium chloride 0.9 % 100 mL IVPB        2 g 200 mL/hr over 30 Minutes Intravenous Every 8 hours 09/21/20 1606     09/21/20 2000  metroNIDAZOLE (FLAGYL) IVPB 500 mg  Status:  Discontinued        500 mg 100 mL/hr over 60 Minutes Intravenous Every 8 hours 09/21/20 1553 09/22/20 0858   09/21/20 1300  vancomycin (VANCOREADY) IVPB 1500 mg/300 mL        1,500 mg 150 mL/hr over 120 Minutes Intravenous  Once 09/21/20 1155 09/21/20 1514   09/21/20  1200  ceFEPIme (MAXIPIME) 2 g in sodium chloride 0.9 % 100 mL IVPB        2 g 200 mL/hr over 30 Minutes Intravenous  Once 09/21/20 1155 09/21/20 1315   09/21/20 1145  metroNIDAZOLE (FLAGYL) IVPB 500 mg        500 mg 100 mL/hr over 60 Minutes Intravenous  Once 09/21/20 1133 09/21/20 1315        Subjective: Patient was seen and examined at bedside.  Overnight events noted.   Patient reports feeling much improved.  He denies any abdominal pain, He has a biliary drain with good biliary output.   He denies any nausea and vomiting.  He was having breakfast.  Objective: Vitals:   09/23/20 0500 09/23/20 0600 09/23/20 0700 09/23/20 0800  BP: (!) 115/56 (!) 148/84 127/78 138/79  Pulse: 68 71 69 64  Resp: 18 (!) 21 13 11   Temp:    97.7 F (36.5 C)  TempSrc:    Oral  SpO2: 99% 97% 100% 100%    Intake/Output Summary (Last 24 hours) at 09/23/2020 1126 Last data filed at 09/23/2020 0645 Gross per 24 hour  Intake 2654.63 ml  Output 828 ml  Net 1826.63 ml   There were no vitals filed for this visit.  Examination:  General exam: Appears calm and comfortable, not in any acute distress. Respiratory system: Clear to auscultation. Respiratory effort normal. Cardiovascular system: S1 & S2 heard, RRR. No JVD, murmurs, rubs, gallops or clicks. No pedal edema. Gastrointestinal system: Abdomen is nondistended, soft and nontender. No organomegaly or masses felt. Normal bowel sounds heard.  Biliary drain noted. Central nervous system: Alert and oriented. No focal neurological deficits. Extremities: Symmetric 5 x 5 power.  No edema, no cyanosis, no clubbing. Skin: No rashes, lesions or ulcers Psychiatry: Judgement and insight appear normal. Mood & affect appropriate.     Data Reviewed: I have personally reviewed following labs and imaging studies  CBC: Recent Labs  Lab 09/21/20 1126 09/22/20 0414 09/23/20 0506  WBC 12.7* 10.0 6.2  NEUTROABS 10.2*  --   --   HGB 9.7* 9.2* 8.8*  HCT 28.4*  26.9* 26.1*  MCV 85.0 84.9 85.0  PLT 155 105* 93*   Basic Metabolic Panel: Recent Labs  Lab 09/21/20 1126 09/21/20 2020 09/22/20 0414 09/23/20 0506  NA 123* 130* 132* 134*  K 3.5 3.4* 3.6 4.0  CL 94* 105 108 109  CO2 19* 20* 21* 20*  GLUCOSE 152* 130* 151* 103*  BUN 22 18 17 16   CREATININE 0.96 0.96 0.81 0.67  CALCIUM 7.8* 7.3* 7.2* 7.4*   GFR: Estimated Creatinine Clearance: 70.1 mL/min (by C-G formula based on SCr of 0.67 mg/dL). Liver Function Tests: Recent Labs  Lab 09/21/20 1126 09/22/20 0414 09/23/20 0506  AST 147* 103* 73*  ALT 95* 74* 61*  ALKPHOS 189* 165* 182*  BILITOT 3.6* 3.3* 2.4*  PROT 6.2* 5.1* 4.9*  ALBUMIN 2.6* 2.1* 2.0*   No results for input(s): LIPASE, AMYLASE in the last 168 hours. No results for input(s): AMMONIA in the last 168 hours. Coagulation Profile: Recent Labs  Lab  09/21/20 1126 09/22/20 0414  INR 1.6* 1.8*   Cardiac Enzymes: No results for input(s): CKTOTAL, CKMB, CKMBINDEX, TROPONINI in the last 168 hours. BNP (last 3 results) No results for input(s): PROBNP in the last 8760 hours. HbA1C: No results for input(s): HGBA1C in the last 72 hours. CBG: Recent Labs  Lab 09/22/20 0738 09/23/20 0742  GLUCAP 97 85   Lipid Profile: No results for input(s): CHOL, HDL, LDLCALC, TRIG, CHOLHDL, LDLDIRECT in the last 72 hours. Thyroid Function Tests: No results for input(s): TSH, T4TOTAL, FREET4, T3FREE, THYROIDAB in the last 72 hours. Anemia Panel: No results for input(s): VITAMINB12, FOLATE, FERRITIN, TIBC, IRON, RETICCTPCT in the last 72 hours. Sepsis Labs: Recent Labs  Lab 09/21/20 1126 09/21/20 1323 09/21/20 2023  LATICACIDVEN 2.6* 3.6* 1.2    Recent Results (from the past 240 hour(s))  Urine culture     Status: None   Collection Time: 09/19/20  6:10 PM   Specimen: Urine, Clean Catch  Result Value Ref Range Status   Specimen Description   Final    URINE, CLEAN CATCH Performed at [facility], [place]  [address], [place], [place] [address]    Special Requests   Final    URINE, CLEAN CATCH Performed at [facility], [place] [address] [place], [place] [address]    Culture   Final    NO GROWTH Performed at [facility], [place] [address], [place], [place] [address]    Report Status 09/21/2020 FINAL  Final  Culture, blood (Routine x 2)     Status: Abnormal (Preliminary result)   Collection Time: 09/21/20 11:26 AM   Specimen: BLOOD  Result Value Ref Range Status   Specimen Description   Final    BLOOD BLOOD LEFT FOREARM Performed at [facility] [address] [place], [place] [address]    Special Requests   Final    BOTTLES DRAWN AEROBIC AND ANAEROBIC Blood Culture results may not be optimal due to an inadequate volume of blood received in culture bottles Performed at [facility] [address], [place], [place] [address]    Culture  Setup Time   Final    GRAM NEGATIVE RODS IN BOTH AEROBIC AND ANAEROBIC BOTTLES CRITICAL RESULT CALLED TO, READ BACK BY AND VERIFIED WITH: PHARMD NICK G. [phone] FCP    Culture (A)  Final    ENTEROBACTER AEROGENES SUSCEPTIBILITIES TO FOLLOW Performed at [facility], [place] [address] [place], [place] [address]    Report Status PENDING  Incomplete  Blood Culture ID Panel (Reflexed)     Status: Abnormal   Collection Time: 09/21/20 11:26 AM  Result Value Ref Range Status   Enterococcus faecalis NOT DETECTED NOT DETECTED Final   Enterococcus Faecium NOT DETECTED NOT DETECTED Final   Listeria monocytogenes NOT DETECTED NOT DETECTED Final   Staphylococcus species NOT DETECTED NOT DETECTED Final   Staphylococcus aureus (BCID) NOT DETECTED NOT DETECTED Final   Staphylococcus epidermidis NOT DETECTED NOT DETECTED Final   Staphylococcus lugdunensis NOT DETECTED NOT DETECTED Final   Streptococcus species NOT DETECTED NOT DETECTED Final   Streptococcus agalactiae NOT DETECTED NOT DETECTED  Final   Streptococcus pneumoniae NOT DETECTED NOT DETECTED Final   Streptococcus pyogenes NOT DETECTED NOT DETECTED Final   A.calcoaceticus-baumannii NOT DETECTED NOT DETECTED Final   Bacteroides fragilis NOT DETECTED NOT DETECTED Final   Enterobacterales DETECTED (A) NOT DETECTED Final    Comment: Enterobacterales represent a large order of  gram negative bacteria, not a single organism. CRITICAL RESULT CALLED TO, READ BACK BY AND VERIFIED WITH: PHARMD NICK G. [id number] FCP    Enterobacter cloacae complex NOT DETECTED NOT DETECTED Final   Escherichia coli NOT DETECTED NOT DETECTED Final   Klebsiella aerogenes DETECTED (A) NOT DETECTED Final    Comment: CRITICAL RESULT CALLED TO, READ BACK BY AND VERIFIED WITH: PHARMD NICK G. [id number] FCP    Klebsiella oxytoca NOT DETECTED NOT DETECTED Final   Klebsiella pneumoniae NOT DETECTED NOT DETECTED Final   Proteus species NOT DETECTED NOT DETECTED Final   Salmonella species NOT DETECTED NOT DETECTED Final   Serratia marcescens NOT DETECTED NOT DETECTED Final   Haemophilus influenzae NOT DETECTED NOT DETECTED Final   Neisseria meningitidis NOT DETECTED NOT DETECTED Final   Pseudomonas aeruginosa NOT DETECTED NOT DETECTED Final   Stenotrophomonas maltophilia NOT DETECTED NOT DETECTED Final   Candida albicans NOT DETECTED NOT DETECTED Final   Candida auris NOT DETECTED NOT DETECTED Final   Candida glabrata NOT DETECTED NOT DETECTED Final   Candida krusei NOT DETECTED NOT DETECTED Final   Candida parapsilosis NOT DETECTED NOT DETECTED Final   Candida tropicalis NOT DETECTED NOT DETECTED Final   Cryptococcus neoformans/gattii NOT DETECTED NOT DETECTED Final   CTX-M ESBL NOT DETECTED NOT DETECTED Final   Carbapenem resistance IMP NOT DETECTED NOT DETECTED Final   Carbapenem resistance KPC NOT DETECTED NOT DETECTED Final   Carbapenem resistance NDM NOT DETECTED NOT DETECTED Final   Carbapenem resist OXA 48 LIKE NOT DETECTED NOT DETECTED  Final   Carbapenem resistance VIM NOT DETECTED NOT DETECTED Final    Comment: Performed at [facility], [address], [place], [place] [address]  Resp Panel by RT-PCR (Flu A&B, Covid) Nasopharyngeal Swab     Status: None   Collection Time: 09/21/20 11:33 AM   Specimen: Nasopharyngeal Swab; Nasopharyngeal(NP) swabs in vial transport medium  Result Value Ref Range Status   SARS Coronavirus 2 by RT PCR NEGATIVE NEGATIVE Final    Comment: (NOTE) SARS-CoV-2 target nucleic acids are NOT DETECTED.  The SARS-CoV-2 RNA is generally detectable in upper respiratory specimens during the acute phase of infection. The lowest concentration of SARS-CoV-2 viral copies this assay can detect is 138 copies/mL. A negative result does not preclude SARS-Cov-2 infection and should not be used as the sole basis for treatment or other patient management decisions. A negative result may occur with  improper specimen collection/handling, submission of specimen other than nasopharyngeal swab, presence of viral mutation(s) within the areas targeted by this assay, and inadequate number of viral copies(<138 copies/mL). A negative result must be combined with clinical observations, patient history, and epidemiological information. The expected result is Negative.  Fact Sheet for Patients:  EntrepreneurPulse.com.au  Fact Sheet for Healthcare Providers:  IncredibleEmployment.be  This test is no t yet approved or cleared by the [place] FDA and  has been authorized for detection and/or diagnosis of SARS-CoV-2 by FDA under an Emergency Use Authorization (EUA). This EUA will remain  in effect (meaning this test can be used) for the duration of the COVID-19 declaration under Section 564(b)(1) of the Act, 21 U.S.C.section 360bbb-3(b)(1), unless the authorization is terminated  or revoked sooner.       Influenza A by PCR NEGATIVE NEGATIVE Final   Influenza B by PCR  NEGATIVE NEGATIVE Final    Comment: (NOTE) The Xpert Xpress SARS-CoV-2/FLU/RSV plus assay is intended as an aid in the diagnosis of influenza from  Nasopharyngeal swab specimens and should not be used as a sole basis for treatment. Nasal washings and aspirates are unacceptable for Xpert Xpress SARS-CoV-2/FLU/RSV testing.  Fact Sheet for Patients: EntrepreneurPulse.com.au  Fact Sheet for Healthcare Providers: IncredibleEmployment.be  This test is not yet approved or cleared by the [place] FDA and has been authorized for detection and/or diagnosis of SARS-CoV-2 by FDA under an Emergency Use Authorization (EUA). This EUA will remain in effect (meaning this test can be used) for the duration of the COVID-19 declaration under Section 564(b)(1) of the Act, 21 U.S.C. section 360bbb-3(b)(1), unless the authorization is terminated or revoked.  Performed at [facility], [place] [address], [place], [place] [address]   Culture, blood (Routine x 2)     Status: None (Preliminary result)   Collection Time: 09/21/20 11:42 AM   Specimen: BLOOD  Result Value Ref Range Status   Specimen Description   Final    BLOOD PORTA CATH Performed at [facility] [address], [place], [place] [address]    Special Requests   Final    BOTTLES DRAWN AEROBIC AND ANAEROBIC Blood Culture adequate volume Performed at [facility] [address], [place], [place] [address]    Culture  Setup Time   Final    GRAM NEGATIVE RODS IN BOTH AEROBIC AND ANAEROBIC BOTTLES    Culture   Final    GRAM NEGATIVE RODS IDENTIFICATION TO FOLLOW Performed at [facility], [place] [address] [place], [place] [address]    Report Status PENDING  Incomplete  Urine culture     Status: None   Collection Time: 09/21/20  2:45 PM   Specimen: In/Out Cath Urine  Result Value Ref Range Status   Specimen Description   Final    IN/OUT  CATH URINE Performed at [facility] [address] [place], [place] [address]    Special Requests   Final    NONE Performed at [facility], [place] [address] [place], [place] [address]    Culture   Final    NO GROWTH Performed at [facility], [place] [address], [place], [place] [address]    Report Status 09/23/2020 FINAL  Final  Body fluid culture w Gram Stain     Status: None (Preliminary result)   Collection Time: 09/21/20  3:15 PM   Specimen: BILE; Body Fluid  Result Value Ref Range Status   Specimen Description   Final    BILE Performed at [facility] [address] [place], [place] [address]    Special Requests   Final    Immunocompromised Performed at [facility], [place] [address] [place], [place] [address]    Gram Stain NO WBC SEEN RARE GRAM NEGATIVE RODS   Final   Culture   Final    CULTURE REINCUBATED FOR BETTER GROWTH Performed at [facility], [place] [address], [place],  [address]    Report Status PENDING  Incomplete  MRSA PCR Screening     Status: None   Collection Time: 09/21/20  4:44 PM  Result Value Ref Range Status   MRSA by PCR NEGATIVE NEGATIVE Final    Comment:        The GeneXpert MRSA Assay (FDA approved for NASAL specimens only), is one component of a comprehensive MRSA colonization surveillance program. It is not intended to diagnose MRSA infection nor to guide or monitor treatment for MRSA infections. Performed  at [facility], [place] [address], [place], [place] [address]     Radiology Studies: DG Chest 2 View  Result Date: 09/21/2020 CLINICAL DATA:  Weakness and fever for 2 weeks. EXAM: CHEST - 2 VIEW COMPARISON:  03/26/2019 FINDINGS: The cardiomediastinal silhouette is unremarkable. Mild peribronchial thickening again noted. A RIGHT Port-A-Cath is again noted with tip overlying the SUPERIOR cavoatrial junction. There is no  evidence of focal airspace disease, pulmonary edema, suspicious pulmonary nodule/mass, pleural effusion, or pneumothorax. No acute bony abnormalities are identified. IMPRESSION: No active cardiopulmonary disease. Electronically Signed   By: Margarette Canada M.D.   On: 09/21/2020 12:23   CT CHEST ABDOMEN PELVIS W CONTRAST  Result Date: 09/21/2020 CLINICAL DATA:  Abdominal pain, fever, weakness. Urinary tract infection. Gastrointestinal cancer. EXAM: CT CHEST, ABDOMEN, AND PELVIS WITH CONTRAST TECHNIQUE: Multidetector CT imaging of the chest, abdomen and pelvis was performed following the standard protocol during bolus administration of intravenous contrast. CONTRAST:  150mL OMNIPAQUE IOHEXOL 300 MG/ML  SOLN COMPARISON:  CT abdomen pelvis 09/10/2020 and CT chest 06/12/2020. MR abdomen 09/04/2020 and PET 06/25/2020 FINDINGS: CT CHEST FINDINGS Cardiovascular: Right IJ Port-A-Cath terminates at the SVC RA junction. Heart is at the upper limits of normal in size. No pericardial effusion. Mediastinum/Nodes: Mediastinal lymph nodes measure up to 11 mm in the low right paratracheal station, similar. Calcified mediastinal lymph nodes. No hilar or axillary adenopathy. Esophagus is grossly. Lungs/Pleura: Centrilobular and paraseptal emphysema. Calcified granulomas. Minimal dependent atelectasis bilaterally. No pleural fluid. Adherent debris in the right mainstem bronchus. Musculoskeletal: Degenerative changes in the spine. No worrisome lytic or sclerotic lesions. CT ABDOMEN PELVIS FINDINGS Hepatobiliary: Similar biliary ductal dilatation with a percutaneous biliary drainage catheter in place, terminating in the duodenum. Associated centrally obstructing mass, better seen on 09/04/2020. 9 mm low-attenuation lesion in the periphery of segment 4 (2/65), unchanged and too small to characterize. Other subcapsular metastases seen on 09/03/2020 are not readily appreciated. Gallbladder is dilated. Pancreas: Mass seen in the pancreatic  body on 09/04/2020 is poorly appreciated. Spleen: Negative. Adrenals/Urinary Tract: Adrenal glands are unremarkable. Right renal stone. Kidneys are otherwise unremarkable. Ureters are decompressed. Bladder may be minimally thick-walled. Stomach/Bowel: Distal gastrectomy. Stomach, small bowel and appendix are otherwise unremarkable. Fluid seen in the colon. Vascular/Lymphatic: Vascular structures are unremarkable. No pathologically enlarged lymph nodes. Reproductive: Prostate is visualized. Other: Bilateral omental nodules measure up to 1.4 cm, as on 09/10/2020. Mesenteric nodules measure up to 8 mm in short axis (2/86). No free fluid. Left inguinal hernia contains fat. Musculoskeletal: Degenerative changes in the spine. IMPRESSION: 1. No findings to explain the patient's acute clinical history. 2. Peritoneal/omental metastases, as on 09/10/2020. 3. Percutaneous biliary drain in place with similar gallbladder and biliary ductal dilatation. Associated obstructing mass seen on 09/04/2020 is better seen on that study. 4. Pancreatic body mass, also better seen on 09/04/2020. 5. Right renal stone. Electronically Signed   By: Lorin Picket M.D.   On: 09/21/2020 14:47   ECHOCARDIOGRAM COMPLETE  Result Date: 09/22/2020    ECHOCARDIOGRAM REPORT   Patient Name:   Jonathan Lewis Date of Exam: 09/22/2020 Medical Rec #:  [id number]        Height:       68.0 in Accession #:    [id number]       Weight:       174.4 lb Date of Birth:  September 06, 1940        BSA:          1.928 m  Patient Age:    31 years         BP:           116/82 mmHg Patient Gender: M                HR:           66 bpm. Exam Location:  Inpatient Procedure: 2D Echo, Color Doppler and Cardiac Doppler Indications:    Bacteremia  History:        Patient has prior history of Echocardiogram examinations, most                 recent 05/06/2015. Risk Factors:Diabetes, Hypertension and                 Dyslipidemia.  Sonographer:    Bernadene Person RDCS Referring Phys:  Chowchilla Riley  1. Left ventricular ejection fraction, by estimation, is 40 to 45%. The left ventricle has mildly decreased function. The left ventricle demonstrates global hypokinesis. Left ventricular diastolic parameters are consistent with Grade I diastolic dysfunction (impaired relaxation).  2. Right ventricular systolic function is normal. The right ventricular size is normal. Tricuspid regurgitation signal is inadequate for assessing PA pressure.  3. The mitral valve is normal in structure. Mild mitral valve regurgitation. No evidence of mitral stenosis.  4. The aortic valve is tricuspid. Aortic valve regurgitation is not visualized. No aortic stenosis is present.  5. Aortic dilatation noted. There is borderline dilatation of the aortic root, measuring 39 mm.  6. The inferior vena cava is normal in size with greater than 50% respiratory variability, suggesting right atrial pressure of 3 mmHg. FINDINGS  Left Ventricle: Left ventricular ejection fraction, by estimation, is 40 to 45%. The left ventricle has mildly decreased function. The left ventricle demonstrates global hypokinesis. The left ventricular internal cavity size was normal in size. There is  no left ventricular hypertrophy. Left ventricular diastolic parameters are consistent with Grade I diastolic dysfunction (impaired relaxation). Right Ventricle: The right ventricular size is normal. Right ventricular systolic function is normal. Tricuspid regurgitation signal is inadequate for assessing PA pressure. The tricuspid regurgitant velocity is 1.44 m/s, and with an assumed right atrial  pressure of 3 mmHg, the estimated right ventricular systolic pressure is 01.0 mmHg. Left Atrium: Left atrial size was normal in size. Right Atrium: Right atrial size was normal in size. Pericardium: There is no evidence of pericardial effusion. Mitral Valve: The mitral valve is normal in structure. Mild mitral valve regurgitation. No evidence of  mitral valve stenosis. Tricuspid Valve: The tricuspid valve is normal in structure. Tricuspid valve regurgitation is trivial. No evidence of tricuspid stenosis. Aortic Valve: The aortic valve is tricuspid. Aortic valve regurgitation is not visualized. No aortic stenosis is present. Pulmonic Valve: The pulmonic valve was grossly normal. Pulmonic valve regurgitation is not visualized. No evidence of pulmonic stenosis. Aorta: Aortic dilatation noted. There is borderline dilatation of the aortic root, measuring 39 mm. Venous: The inferior vena cava is normal in size with greater than 50% respiratory variability, suggesting right atrial pressure of 3 mmHg. IAS/Shunts: No atrial level shunt detected by color flow Doppler.  LEFT VENTRICLE PLAX 2D LVIDd:         4.70 cm      Diastology LVIDs:         3.70 cm      LV e' medial:    7.38 cm/s LV PW:         0.70 cm  LV E/e' medial:  7.2 LV IVS:        0.80 cm      LV e' lateral:   7.35 cm/s LVOT diam:     2.10 cm      LV E/e' lateral: 7.3 LV SV:         57 LV SV Index:   29 LVOT Area:     3.46 cm  LV Volumes (MOD) LV vol d, MOD A2C: 118.0 ml LV vol d, MOD A4C: 109.0 ml LV vol s, MOD A2C: 60.7 ml LV vol s, MOD A4C: 43.6 ml LV SV MOD A2C:     57.3 ml LV SV MOD A4C:     109.0 ml LV SV MOD BP:      64.1 ml RIGHT VENTRICLE RV S prime:     7.90 cm/s TAPSE (M-mode): 1.9 cm LEFT ATRIUM             Index       RIGHT ATRIUM           Index LA diam:        2.50 cm 1.30 cm/m  RA Area:     17.30 cm LA Vol (A2C):   38.0 ml 19.71 ml/m RA Volume:   50.40 ml  26.14 ml/m LA Vol (A4C):   39.2 ml 20.33 ml/m LA Biplane Vol: 41.5 ml 21.52 ml/m  AORTIC VALVE LVOT Vmax:   74.10 cm/s LVOT Vmean:  46.200 cm/s LVOT VTI:    0.164 m  AORTA Ao Root diam: 3.90 cm Ao Asc diam:  3.40 cm MITRAL VALVE               TRICUSPID VALVE MV Area (PHT): 2.91 cm    TR Peak grad:   8.3 mmHg MV Decel Time: 261 msec    TR Vmax:        144.00 cm/s MV E velocity: 53.30 cm/s MV A velocity: 58.80 cm/s  SHUNTS MV  E/A ratio:  0.91        Systemic VTI:  0.16 m                            Systemic Diam: 2.10 cm Kirk Ruths MD Electronically signed by Kirk Ruths MD Signature Date/Time: 09/22/2020/2:10:50 PM    Final    US ABDOMEN LIMITED RUQ (LIVER/GB)  Result Date: 09/21/2020 CLINICAL DATA:  Fever EXAM: ULTRASOUND ABDOMEN LIMITED RIGHT UPPER QUADRANT COMPARISON:  None. FINDINGS: Gallbladder: No gallstones. Gallbladder wall edema. Minor pericholecystic fluid. No sonographic Murphy sign noted by sonographer. Common bile duct: Diameter: Not well visualized, measuring 6 mm. Echogenic material may reflect sludge. A percutaneous biliary drain is noted on the prior CT. Liver: No focal lesion identified. Intrahepatic duct dilatation. Within normal limits in parenchymal echogenicity. Portal vein is patent on color Doppler imaging with normal direction of blood flow towards the liver. Other: None. IMPRESSION: Nonspecific gallbladder wall edema. No sonographic evidence of acute cholecystitis. Intrahepatic duct dilatation. Common bile duct containing echogenic material that may reflect sludge. Electronically Signed   By: Macy Mis M.D.   On: 09/21/2020 20:35    Scheduled Meds:  chlorhexidine  15 mL Mouth Rinse BID   Chlorhexidine Gluconate Cloth  6 each Topical Daily   dorzolamide-timolol  1 drop Both Eyes BID   latanoprost  1 drop Both Eyes QHS   mouth rinse  15 mL Mouth Rinse q12n4p   Continuous Infusions:  sodium chloride 125  mL/hr at 09/23/20 0735   ceFEPime (MAXIPIME) IV Stopped (09/23/20 0550)   norepinephrine (LEVOPHED) Adult infusion Stopped (09/22/20 0956)     LOS: 2 days    Time spent: 35 mins    Rayan Dyal, MD [facility]   If 7PM-7AM, please contact night-coverage

## 2020-09-23 NOTE — Plan of Care (Signed)
  Problem: Education: Goal: Knowledge of General Education information will improve Description: Including pain rating scale, medication(s)/side effects and non-pharmacologic comfort measures Outcome: Progressing   Problem: Health Behavior/Discharge Planning: Goal: Ability to manage health-related needs will improve Outcome: Progressing   Problem: Clinical Measurements: Goal: Respiratory complications will improve Outcome: Progressing   

## 2020-09-23 NOTE — Progress Notes (Addendum)
Referring Physician(s): Dr. Si Raider Dr.Ennever   Supervising Physician: Sandi Mariscal  Patient Status:  [facility] - In-pt  Chief Complaint:  S/p percutaneous biliary drainage  placement with IR on 6/9 Discharged on 6/11, represented to ED on 6/20 due to weakness, was found to be septic and currently admitted.   Subjective:  Patient laying in bed, sleeping.  Arousal to voice. States that he is doing ok. States that he went for radiation therapy.  Denies abd pain, N/V.   Allergies: Patient has no known allergies.  Medications: Prior to Admission medications   Medication Sig Start Date End Date Taking? Authorizing Provider  acetaminophen (TYLENOL) 160 MG/5ML liquid Take 500 mg by mouth every 4 (four) hours as needed for fever.   Yes [provider]  atorvastatin (LIPITOR) 20 MG tablet Take 1 tablet (20 mg total) by mouth daily. Patient taking differently: Take 20 mg by mouth in the morning. 04/26/20 04/26/21 Yes Biagio Borg, MD  cephALEXin (KEFLEX) 500 MG capsule Take 1 capsule (500 mg total) by mouth 4 (four) times daily. Patient taking differently: Take 500 mg by mouth 4 (four) times daily. Start date : 09/20/20 09/19/20  Yes Sherrill Raring, PA-C  dorzolamide-timolol (COSOPT) 22.3-6.8 MG/ML ophthalmic solution Place 1 drop into both eyes 2 (two) times daily. 06/15/20  Yes [provider]  famotidine (PEPCID) 20 MG tablet TAKE 1 TABLET BY MOUTH EVERY DAY Patient taking differently: Take 20 mg by mouth daily. 09/10/20  Yes Ennever, Rudell Cobb, MD  latanoprost (XALATAN) 0.005 % ophthalmic solution Place 1 drop into both eyes at bedtime.  01/09/19  Yes [provider]  lidocaine-prilocaine (EMLA) cream Apply 1 application topically as needed. Place on the port one hour before appointment. Patient taking differently: Apply 1 application topically daily as needed (port access). Place on the port one hour before appointment. 08/24/20  Yes Volanda Napoleon, MD  meclizine  (ANTIVERT) 12.5 MG tablet TAKE 1 TABLET BY MOUTH 3 TIMES DAILY AS NEEDED FOR DIZZINESS. Patient taking differently: Take 12.5 mg by mouth daily as needed for dizziness. 08/10/20  Yes Biagio Borg, MD  Multiple Vitamin (MULTIVITAMIN WITH MINERALS) TABS tablet Take 1 tablet by mouth in the morning. Men's One-A-Day Chewable   Yes [provider]  ondansetron (ZOFRAN) 8 MG tablet Take 1 tablet (8 mg total) by mouth every 8 (eight) hours as needed for nausea or vomiting. 09/16/20  Yes Hayden Pedro, PA-C  senna-docusate (SENOKOT S) 8.6-50 MG tablet Take 1 tablet by mouth daily. 09/12/20  Yes Domenic Polite, MD  SYSTANE ULTRA 0.4-0.3 % SOLN Place 1 drop into both eyes 3 (three) times daily as needed (dry eyes). 01/09/19  Yes [provider]  traMADol (ULTRAM) 50 MG tablet Take 2 tablets (100 mg total) by mouth at bedtime as needed for moderate pain or severe pain. 04/22/20  Yes Biagio Borg, MD     Vital Signs: BP 138/79   Pulse 64   Temp 97.7 F (36.5 C) (Oral)   Resp 11   SpO2 100%   Physical Exam Vitals reviewed.  Constitutional:      General: He is not in acute distress.    Appearance: He is ill-appearing.  HENT:     Head: Normocephalic and atraumatic.  Cardiovascular:     Rate and Rhythm: Normal rate.  Pulmonary:     Effort: Pulmonary effort is normal.  Abdominal:     Palpations: Abdomen is soft.  Skin:    General:  Skin is warm and dry.     Coloration: Skin is not jaundiced.     Comments: Positive RUQ drain to a gravity bag. Site is unremarkable with no erythema, edema, tenderness, bleeding or drainage. Suture and stat lock in place. Dressing is clean, dry, and intact. 200 ml of  bile colored fluid noted in the gravity bag. Drain aspirates and flushes well.    Neurological:     Mental Status: He is alert and oriented to person, place, and time.  Psychiatric:        Mood and Affect: Mood normal.        Behavior: Behavior normal.    Imaging: DG Chest 2  View  Result Date: 09/21/2020 CLINICAL DATA:  Weakness and fever for 2 weeks. EXAM: CHEST - 2 VIEW COMPARISON:  03/26/2019 FINDINGS: The cardiomediastinal silhouette is unremarkable. Mild peribronchial thickening again noted. A RIGHT Port-A-Cath is again noted with tip overlying the SUPERIOR cavoatrial junction. There is no evidence of focal airspace disease, pulmonary edema, suspicious pulmonary nodule/mass, pleural effusion, or pneumothorax. No acute bony abnormalities are identified. IMPRESSION: No active cardiopulmonary disease. Electronically Signed   By: Margarette Canada M.D.   On: 09/21/2020 12:23   CT CHEST ABDOMEN PELVIS W CONTRAST  Result Date: 09/21/2020 CLINICAL DATA:  Abdominal pain, fever, weakness. Urinary tract infection. Gastrointestinal cancer. EXAM: CT CHEST, ABDOMEN, AND PELVIS WITH CONTRAST TECHNIQUE: Multidetector CT imaging of the chest, abdomen and pelvis was performed following the standard protocol during bolus administration of intravenous contrast. CONTRAST:  145mL OMNIPAQUE IOHEXOL 300 MG/ML  SOLN COMPARISON:  CT abdomen pelvis 09/10/2020 and CT chest 06/12/2020. MR abdomen 09/04/2020 and PET 06/25/2020 FINDINGS: CT CHEST FINDINGS Cardiovascular: Right IJ Port-A-Cath terminates at the SVC RA junction. Heart is at the upper limits of normal in size. No pericardial effusion. Mediastinum/Nodes: Mediastinal lymph nodes measure up to 11 mm in the low right paratracheal station, similar. Calcified mediastinal lymph nodes. No hilar or axillary adenopathy. Esophagus is grossly. Lungs/Pleura: Centrilobular and paraseptal emphysema. Calcified granulomas. Minimal dependent atelectasis bilaterally. No pleural fluid. Adherent debris in the right mainstem bronchus. Musculoskeletal: Degenerative changes in the spine. No worrisome lytic or sclerotic lesions. CT ABDOMEN PELVIS FINDINGS Hepatobiliary: Similar biliary ductal dilatation with a percutaneous biliary drainage catheter in place, terminating in  the duodenum. Associated centrally obstructing mass, better seen on 09/04/2020. 9 mm low-attenuation lesion in the periphery of segment 4 (2/65), unchanged and too small to characterize. Other subcapsular metastases seen on 09/03/2020 are not readily appreciated. Gallbladder is dilated. Pancreas: Mass seen in the pancreatic body on 09/04/2020 is poorly appreciated. Spleen: Negative. Adrenals/Urinary Tract: Adrenal glands are unremarkable. Right renal stone. Kidneys are otherwise unremarkable. Ureters are decompressed. Bladder may be minimally thick-walled. Stomach/Bowel: Distal gastrectomy. Stomach, small bowel and appendix are otherwise unremarkable. Fluid seen in the colon. Vascular/Lymphatic: Vascular structures are unremarkable. No pathologically enlarged lymph nodes. Reproductive: Prostate is visualized. Other: Bilateral omental nodules measure up to 1.4 cm, as on 09/10/2020. Mesenteric nodules measure up to 8 mm in short axis (2/86). No free fluid. Left inguinal hernia contains fat. Musculoskeletal: Degenerative changes in the spine. IMPRESSION: 1. No findings to explain the patient's acute clinical history. 2. Peritoneal/omental metastases, as on 09/10/2020. 3. Percutaneous biliary drain in place with similar gallbladder and biliary ductal dilatation. Associated obstructing mass seen on 09/04/2020 is better seen on that study. 4. Pancreatic body mass, also better seen on 09/04/2020. 5. Right renal stone. Electronically Signed   By: Lorin Picket M.D.  On: 09/21/2020 14:47   ECHOCARDIOGRAM COMPLETE  Result Date: 09/22/2020    ECHOCARDIOGRAM REPORT   Patient Name:   Jonathan BACHTEL Lewis Date of Exam: 09/22/2020 Medical Rec #:  [id number]        Height:       68.0 in Accession #:    [id number]       Weight:       174.4 lb Date of Birth:  December 31, 1940        BSA:          1.928 m Patient Age:    80 years         BP:           116/82 mmHg Patient Gender: M                HR:           66 bpm. Exam Location:   Inpatient Procedure: 2D Echo, Color Doppler and Cardiac Doppler Indications:    Bacteremia  History:        Patient has prior history of Echocardiogram examinations, most                 recent 05/06/2015. Risk Factors:Diabetes, Hypertension and                 Dyslipidemia.  Sonographer:    Bernadene Person RDCS Referring Phys: Gloster Raymer  1. Left ventricular ejection fraction, by estimation, is 40 to 45%. The left ventricle has mildly decreased function. The left ventricle demonstrates global hypokinesis. Left ventricular diastolic parameters are consistent with Grade I diastolic dysfunction (impaired relaxation).  2. Right ventricular systolic function is normal. The right ventricular size is normal. Tricuspid regurgitation signal is inadequate for assessing PA pressure.  3. The mitral valve is normal in structure. Mild mitral valve regurgitation. No evidence of mitral stenosis.  4. The aortic valve is tricuspid. Aortic valve regurgitation is not visualized. No aortic stenosis is present.  5. Aortic dilatation noted. There is borderline dilatation of the aortic root, measuring 39 mm.  6. The inferior vena cava is normal in size with greater than 50% respiratory variability, suggesting right atrial pressure of 3 mmHg. FINDINGS  Left Ventricle: Left ventricular ejection fraction, by estimation, is 40 to 45%. The left ventricle has mildly decreased function. The left ventricle demonstrates global hypokinesis. The left ventricular internal cavity size was normal in size. There is  no left ventricular hypertrophy. Left ventricular diastolic parameters are consistent with Grade I diastolic dysfunction (impaired relaxation). Right Ventricle: The right ventricular size is normal. Right ventricular systolic function is normal. Tricuspid regurgitation signal is inadequate for assessing PA pressure. The tricuspid regurgitant velocity is 1.44 m/s, and with an assumed right atrial  pressure of 3 mmHg,  the estimated right ventricular systolic pressure is 46.5 mmHg. Left Atrium: Left atrial size was normal in size. Right Atrium: Right atrial size was normal in size. Pericardium: There is no evidence of pericardial effusion. Mitral Valve: The mitral valve is normal in structure. Mild mitral valve regurgitation. No evidence of mitral valve stenosis. Tricuspid Valve: The tricuspid valve is normal in structure. Tricuspid valve regurgitation is trivial. No evidence of tricuspid stenosis. Aortic Valve: The aortic valve is tricuspid. Aortic valve regurgitation is not visualized. No aortic stenosis is present. Pulmonic Valve: The pulmonic valve was grossly normal. Pulmonic valve regurgitation is not visualized. No evidence of pulmonic stenosis. Aorta: Aortic dilatation noted. There is borderline dilatation of the aortic root,  measuring 39 mm. Venous: The inferior vena cava is normal in size with greater than 50% respiratory variability, suggesting right atrial pressure of 3 mmHg. IAS/Shunts: No atrial level shunt detected by color flow Doppler.  LEFT VENTRICLE PLAX 2D LVIDd:         4.70 cm      Diastology LVIDs:         3.70 cm      LV e' medial:    7.38 cm/s LV PW:         0.70 cm      LV E/e' medial:  7.2 LV IVS:        0.80 cm      LV e' lateral:   7.35 cm/s LVOT diam:     2.10 cm      LV E/e' lateral: 7.3 LV SV:         57 LV SV Index:   29 LVOT Area:     3.46 cm  LV Volumes (MOD) LV vol d, MOD A2C: 118.0 ml LV vol d, MOD A4C: 109.0 ml LV vol s, MOD A2C: 60.7 ml LV vol s, MOD A4C: 43.6 ml LV SV MOD A2C:     57.3 ml LV SV MOD A4C:     109.0 ml LV SV MOD BP:      64.1 ml RIGHT VENTRICLE RV S prime:     7.90 cm/s TAPSE (M-mode): 1.9 cm LEFT ATRIUM             Index       RIGHT ATRIUM           Index LA diam:        2.50 cm 1.30 cm/m  RA Area:     17.30 cm LA Vol (A2C):   38.0 ml 19.71 ml/m RA Volume:   50.40 ml  26.14 ml/m LA Vol (A4C):   39.2 ml 20.33 ml/m LA Biplane Vol: 41.5 ml 21.52 ml/m  AORTIC VALVE LVOT  Vmax:   74.10 cm/s LVOT Vmean:  46.200 cm/s LVOT VTI:    0.164 m  AORTA Ao Root diam: 3.90 cm Ao Asc diam:  3.40 cm MITRAL VALVE               TRICUSPID VALVE MV Area (PHT): 2.91 cm    TR Peak grad:   8.3 mmHg MV Decel Time: 261 msec    TR Vmax:        144.00 cm/s MV E velocity: 53.30 cm/s MV A velocity: 58.80 cm/s  SHUNTS MV E/A ratio:  0.91        Systemic VTI:  0.16 m                            Systemic Diam: 2.10 cm Kirk Ruths MD Electronically signed by Kirk Ruths MD Signature Date/Time: 09/22/2020/2:10:50 PM    Final    US ABDOMEN LIMITED RUQ (LIVER/GB)  Result Date: 09/21/2020 CLINICAL DATA:  Fever EXAM: ULTRASOUND ABDOMEN LIMITED RIGHT UPPER QUADRANT COMPARISON:  None. FINDINGS: Gallbladder: No gallstones. Gallbladder wall edema. Minor pericholecystic fluid. No sonographic Murphy sign noted by sonographer. Common bile duct: Diameter: Not well visualized, measuring 6 mm. Echogenic material may reflect sludge. A percutaneous biliary drain is noted on the prior CT. Liver: No focal lesion identified. Intrahepatic duct dilatation. Within normal limits in parenchymal echogenicity. Portal vein is patent on color Doppler imaging with normal direction of blood flow towards the liver. Other:  None. IMPRESSION: Nonspecific gallbladder wall edema. No sonographic evidence of acute cholecystitis. Intrahepatic duct dilatation. Common bile duct containing echogenic material that may reflect sludge. Electronically Signed   By: Macy Mis M.D.   On: 09/21/2020 20:35    Labs:  CBC: Recent Labs    09/14/20 1200 09/21/20 1126 09/22/20 0414 09/23/20 0506  WBC 5.6 12.7* 10.0 6.2  HGB 10.9* 9.7* 9.2* 8.8*  HCT 32.9* 28.4* 26.9* 26.1*  PLT 197 155 105* 93*    COAGS: Recent Labs    01/06/20 0915 09/09/20 1900 09/21/20 1126 09/22/20 0414  INR 1.0 1.0 1.6* 1.8*  APTT 29  --  35  --     BMP: Recent Labs    10/30/19 0900 11/20/19 0900 12/11/19 1000 12/31/19 1056 02/18/20 1025  09/21/20 1126 09/21/20 2020 09/22/20 0414 09/23/20 0506  NA 141 139 141 140   < > 123* 130* 132* 134*  K 3.5 4.0 4.0 4.0   < > 3.5 3.4* 3.6 4.0  CL 108 104 107 106   < > 94* 105 108 109  CO2 27 30 29 28    < > 19* 20* 21* 20*  GLUCOSE 173* 103* 105* 127*   < > 152* 130* 151* 103*  BUN 12 18 13 16    < > 22 18 17 16   CALCIUM 9.2 9.8 9.4 9.4   < > 7.8* 7.3* 7.2* 7.4*  CREATININE 0.76 0.90 0.83 0.87   < > 0.96 0.96 0.81 0.67  GFRNONAA >60 >60 >60 >60   < > >60 >60 >60 >60  GFRAA >60 >60 >60 >60  --   --   --   --   --    < > = values in this interval not displayed.    LIVER FUNCTION TESTS: Recent Labs    09/14/20 1200 09/21/20 1126 09/22/20 0414 09/23/20 0506  BILITOT 4.7* 3.6* 3.3* 2.4*  AST 67* 147* 103* 73*  ALT 119* 95* 74* 61*  ALKPHOS 301* 189* 165* 182*  PROT 6.5 6.2* 5.1* 4.9*  ALBUMIN 3.7 2.6* 2.1* 2.0*    Assessment and Plan:  80 yo male with history of gastric cancer s/p distal gastrostomy in 03/2019, obstructive jaundice, post failed attempt at ERCP biliary stent placement on 09/09/20; S/p percutaneous biliary drainage placement with IR on 09/10/20.  Discharged on 6/11 but unfortunately represented to ED on 6/20 due to weakness, was found to be septic and currently admitted.   Patient stable OP 475 cc, flushes and aspirates well  VSS, afebrile  WBC normal range  LFT improving, T bili 2.4   Was consulted by GI yesterday for possible additional biliary drain placement due to concerning [place] finding, the procedure is on hold as patient is clinically improving with no symptoms of cholecystitis. Plan to obtain cholangiogram prior to discharge.   Continue with flushing TID, output recording q shift and dressing changes as needed. Would consider additional imaging when output is less than 10 ml for 24 hours not including flush material.     Further treatment plan per PCCM/TRH/Oncology  Appreciate and agree with the plan.  IR to follow.    Electronically Signed: Tera Mater, PA-C 09/23/2020, 9:39 AM   I spent a total of 15 Minutes at the the patient's bedside AND on the patient's hospital floor or unit, greater than 50% of which was counseling/coordinating care for biliary drain

## 2020-09-24 ENCOUNTER — Ambulatory Visit
Admission: RE | Admit: 2020-09-24 | Discharge: 2020-09-24 | Disposition: A | Discharge status: Home / Self Care | Payer: Medicare PPO | Source: Ambulatory Visit | Attending: Radiation Oncology | Admitting: Radiation Oncology

## 2020-09-24 ENCOUNTER — Inpatient Hospital Stay (HOSPITAL_COMMUNITY): Payer: Medicare PPO

## 2020-09-24 DIAGNOSIS — E876 Hypokalemia: Secondary | ICD-10-CM

## 2020-09-24 DIAGNOSIS — E8809 Other disorders of plasma-protein metabolism, not elsewhere classified: Secondary | ICD-10-CM

## 2020-09-24 DIAGNOSIS — C772 Secondary and unspecified malignant neoplasm of intra-abdominal lymph nodes: Secondary | ICD-10-CM

## 2020-09-24 DIAGNOSIS — N183 Chronic kidney disease, stage 3 unspecified: Secondary | ICD-10-CM

## 2020-09-24 DIAGNOSIS — Z79899 Other long term (current) drug therapy: Secondary | ICD-10-CM

## 2020-09-24 HISTORY — PX: IR PERC CHOLECYSTOSTOMY: IMG2326

## 2020-09-24 HISTORY — PX: IR EXCHANGE BILIARY DRAIN: IMG6046

## 2020-09-24 LAB — CBC
HCT: 26.3 % — ABNORMAL LOW (ref 39.0–52.0)
Hemoglobin: 8.7 g/dL — ABNORMAL LOW (ref 13.0–17.0)
MCH: 27.8 pg (ref 26.0–34.0)
MCHC: 33.1 g/dL (ref 30.0–36.0)
MCV: 84 fL (ref 80.0–100.0)
Platelets: 133 10*3/uL — ABNORMAL LOW (ref 150–400)
RBC: 3.13 MIL/uL — ABNORMAL LOW (ref 4.22–5.81)
RDW: 14 % (ref 11.5–15.5)
WBC: 6.1 10*3/uL (ref 4.0–10.5)
nRBC: 0 % (ref 0.0–0.2)

## 2020-09-24 LAB — COMPREHENSIVE METABOLIC PANEL
ALT: 55 U/L — ABNORMAL HIGH (ref 0–44)
AST: 60 U/L — ABNORMAL HIGH (ref 15–41)
Albumin: 2 g/dL — ABNORMAL LOW (ref 3.5–5.0)
Alkaline Phosphatase: 246 U/L — ABNORMAL HIGH (ref 38–126)
Anion gap: 6 (ref 5–15)
BUN: 12 mg/dL (ref 8–23)
CO2: 20 mmol/L — ABNORMAL LOW (ref 22–32)
Calcium: 7.5 mg/dL — ABNORMAL LOW (ref 8.9–10.3)
Chloride: 105 mmol/L (ref 98–111)
Creatinine, Ser: 0.64 mg/dL (ref 0.61–1.24)
GFR, Estimated: >60 mL/min (ref >60)
Glucose, Bld: 93 mg/dL (ref 70–99)
Potassium: 3.7 mmol/L (ref 3.5–5.1)
Sodium: 131 mmol/L — ABNORMAL LOW (ref 135–145)
Total Bilirubin: 2.4 mg/dL — ABNORMAL HIGH (ref 0.3–1.2)
Total Protein: 5.2 g/dL — ABNORMAL LOW (ref 6.5–8.1)

## 2020-09-24 LAB — CULTURE, BLOOD (ROUTINE X 2): Special Requests: ADEQUATE

## 2020-09-24 LAB — GLUCOSE, CAPILLARY
Glucose-Capillary: 72 mg/dL (ref 70–99)
Glucose-Capillary: 76 mg/dL (ref 70–99)

## 2020-09-24 LAB — PROTIME-INR
INR: 1.5 — ABNORMAL HIGH (ref 0.8–1.2)
Prothrombin Time: 18.3 seconds — ABNORMAL HIGH (ref 11.4–15.2)

## 2020-09-24 MED ORDER — FENTANYL CITRATE (PF) 100 MCG/2ML IJ SOLN
INTRAMUSCULAR | Status: AC
  Filled 2020-09-24: qty 2

## 2020-09-24 MED ORDER — IOHEXOL 300 MG/ML  SOLN
40.0000 mL | Freq: Once | INTRAMUSCULAR | Status: AC | PRN
  Administered 2020-09-24: 30 mL

## 2020-09-24 MED ORDER — SODIUM CHLORIDE 0.9% FLUSH
5.0000 mL | Freq: Three times a day (TID) | INTRAVENOUS | Status: DC
  Administered 2020-09-24 – 2020-09-28 (×14): 5 mL

## 2020-09-24 MED ORDER — SODIUM CHLORIDE 0.9% FLUSH
10.0000 mL | Freq: Two times a day (BID) | INTRAVENOUS | Status: DC
  Administered 2020-09-26: 10 mL
  Administered 2020-09-26: 20 mL
  Administered 2020-09-27 – 2020-09-29 (×3): 10 mL

## 2020-09-24 MED ORDER — LIDOCAINE HCL 1 % IJ SOLN
INTRAMUSCULAR | Status: AC
  Filled 2020-09-24: qty 20

## 2020-09-24 MED ORDER — MIDAZOLAM HCL 2 MG/2ML IJ SOLN
INTRAMUSCULAR | Status: AC
  Filled 2020-09-24: qty 4

## 2020-09-24 MED ORDER — SODIUM CHLORIDE 0.9% FLUSH
10.0000 mL | INTRAVENOUS | Status: DC | PRN
  Administered 2020-09-29: 10 mL

## 2020-09-24 MED ORDER — LIDOCAINE-EPINEPHRINE 1 %-1:100000 IJ SOLN
INTRAMUSCULAR | Status: AC | PRN
  Administered 2020-09-24: 10 mL

## 2020-09-24 MED ORDER — MIDAZOLAM HCL 2 MG/2ML IJ SOLN
INTRAMUSCULAR | Status: AC | PRN
  Administered 2020-09-24 (×2): 1 mg via INTRAVENOUS

## 2020-09-24 MED ORDER — FENTANYL CITRATE (PF) 100 MCG/2ML IJ SOLN
INTRAMUSCULAR | Status: AC | PRN
  Administered 2020-09-24 (×2): 50 ug via INTRAVENOUS

## 2020-09-24 NOTE — Procedures (Signed)
Pre procedural Dx: Obstructive jaundice; Concern for acute cholecysitis Post procedural Dx: Same  Successful fluoro guided exchange and up-sizing of now 12 Fr percutaneous biliary drain.  Sonographic evaluation demonstrates persistent nodular wall thickening of the gall bladder and minimal amount of pericholecystic fluid.  Definitive sheath cholangiogram performed at the time of the biliary drain exchange failed to opacify the cystic duct suggesting cystic duct obstruction.  As such, decision was made to proceed with definitive cholecystomy tube placement.  Technically successful image guided placed of a 10 Fr drainage catheter placement into the gall bladder.    Both drains were connected to gravity bags.   EBL: Trace Complications: None immediate  Ronny Bacon, MD Pager #: [phone]

## 2020-09-24 NOTE — Progress Notes (Signed)
Looks like Jonathan Lewis has Klebsiella and  Enterobacter growing from the blood.  In the bile, there are gram-negative rods.  There is no identification of the species.    He he is on antibiotics.  He was moved out of the ICU.  He now is on 5 E.  He is feeling a little bit better.  He is eating a little bit better.  The lab work showed bilirubin of 2.4.  His albumin is 2.0.  BUN 12 creatinine 0.64.  His hemoglobin is 8.7.  White cell count 6.1.  He is getting radiation therapy to the abdomen to try to help with the blockage of the biliary system.  He is afebrile.  His pulse is 69.  Blood pressure 131/78.  There really is no change in his overall physical exam.  It will be interesting to see what the sensitivities are with the bacteria.  He will continue his radiation therapy.  I appreciate the great care that he is getting from the wonderful staff on 5 E.  Lattie Haw, MD  Psalm 91:1-2

## 2020-09-24 NOTE — Progress Notes (Signed)
Pharmacy Antibiotic Note  Jonathan Lewis is a 80 y.o. male with a h/o metastatic gastric adenocarcinoma . Pt recently admitted with biliary obstruction s/p biliary stent placement on 09/10/20. Pt presented to ED on 6/20 with sepsis.   Pt currently on antibiotics for enterobacterales, klebsiella  aerogenes bacteremia. ID following, source likely biliary.   Today, 09/24/20 WBC now WNL SCr WNL, stable. CrCl ~80 mL/min Tmax 100.6 F  Plan: Continue cefepime 2 g IV q8h Fluconazole PO per ID  Height: 5\' 8"  (172.7 cm) Weight: 95 kg (209 lb 7 oz) IBW/kg (Calculated) : 68.4  Temp (24hrs), Avg:98.8 F (37.1 C), Min:97.9 F (36.6 C), Max:100.6 F (38.1 C)  Recent Labs  Lab 09/21/20 1126 09/21/20 1323 09/21/20 2020 09/21/20 2023 09/22/20 0414 09/23/20 0506 09/24/20 0620  WBC 12.7*  --   --   --  10.0 6.2 6.1  CREATININE 0.96  --  0.96  --  0.81 0.67 0.64  LATICACIDVEN 2.6* 3.6*  --  1.2  --   --   --      Estimated Creatinine Clearance: 80.9 mL/min (by C-G formula based on SCr of 0.64 mg/dL).    No Known Allergies  Antimicrobials this admission: 6/20 vanc > 6/21 6/20 metronidazole > 6/21 6/20 cefepime >> 6/22 fluconazole >>  Dose adjustments this admission:  Microbiology results: 6/20 BCx: 4/4 GNR; enterobacter aerogenes (R cefazolin); Klebsiella aerogenes 6/20 UCx:  ngF 6/20 bile cx: rare GNR, moderate yeast  Thank you for allowing pharmacy to be a part of this patient's care.  Lenis Noon, PharmD 09/24/2020 10:09 AM

## 2020-09-24 NOTE — Progress Notes (Signed)
PROGRESS NOTE    Jonathan Lewis  UMP:[id number] DOB: 22-Oct-1940 DOA: 09/21/2020 PCP: Biagio Borg, MD    Brief Narrative:  This 80 years old male with PMH significant for metastatic adenocarcinoma of the stomach, s/p Billroth II, DM, HTN who presented in the ED with generalized weakness, fatigue, less interactive,  not getting out of bed.  Patient is found to have biliary obstruction secondary to metastatic cancer.  Biliary stent was attempted  but failed so IR percutaneous drain was placed.  Of note patient presented 2 days ago for urinary catheter exchange , found to be febrile and was prescribed empirical Keflex for UTI.  Urine cultures no growth. Patient is admitted for severe sepsis / septic shock requiring Levophed secondary to Klebsiella bacteremia. Source is still considered biliary.  Infectious disease consulted,  started on IV antibiotics.  Patient underwent percutaneous drain which is draining well.  Liver enzymes trending down.  Assessment & Plan:   Principal Problem:   SIRS (systemic inflammatory response syndrome) (HCC) Active Problems:   Diabetes (HCC)   Benign nodular prostatic hyperplasia   Gastric cancer pT3pN2 s/p distal gastrectomy with Billroth II gastrojejunostomy 03/19/2019   Sepsis ([place])   Severe sepsis / Septic shock secondary to Klebsiella bacteremia: Patient presented with hypotension, lactic acid 3.5 and altered mentation. He briefly required Levophed which was discontinued on 6/21. Blood cultures positive for Klebsiella aerogenes bacteremia. Infectious disease consulted , thinks likely source is biliary. UA is clean, no urinary symptoms. Port-A-Cath is in place, no redness, no swelling , or pain. De-escalate antibiotics to cefepime.  Follow-up blood culture/ sensitivity. Sepsis physiology improved.  Lactic acid normalized.  Acute metabolic encephalopathy : This could be multifactorial related to hypotension and sepsis CT head unremarkable.   Encephalopathy resolved. Continue IV hydration.  Biliary obstruction secondary to metastatic stomach cancer: S/p percutaneous drain by IR.  Draining well.   Patient is status post cholecystostomy tube placement on 6/23 IR is planning to cholangiogram and drain capping trial. Bilirubin and LFTs are trending down.  Urinary obstruction: Continue Foley catheter.   Hyponatremia:  Could be likely secondary to dehydration given decreased p.o. intake. Sodium is improving, continue to trend.   Normocytic normochromic anemia: Baseline hemoglobin 10-11. Continue to monitor.  No obvious bleeding. Hb remains above 8.5   Adenocarcinoma of the stomach: Oncology is following.   Type 2 diabetes: Continue regular insulin sliding scale.     DVT prophylaxis: SCDs Code Status: DNR Family Communication: No family at bed side. Disposition Plan:   Status is: Inpatient  Remains inpatient appropriate because:Inpatient level of care appropriate due to severity of illness  Dispo: The patient is from: Home              Anticipated d/c is to: SNF              Patient currently is not medically stable to d/c.   Difficult to place patient No  Consultants:  GI IR  Procedures: Percutaneous biliary drain. Antimicrobials:   Anti-infectives (From admission, onward)    Start     Dose/Rate Route Frequency Ordered Stop   09/24/20 1400  fluconazole (DIFLUCAN) tablet 400 mg        400 mg Oral Daily 09/23/20 1655     09/23/20 1830  fluconazole (DIFLUCAN) IVPB 400 mg        400 mg 100 mL/hr over 120 Minutes Intravenous Every 2 hours 09/23/20 1821 09/23/20 2322   09/23/20 1800  fluconazole (DIFLUCAN) IVPB 800  mg  Status:  Discontinued        800 mg 200 mL/hr over 120 Minutes Intravenous  Once 09/23/20 1655 09/23/20 1821   09/22/20 1300  vancomycin (VANCOREADY) IVPB 1500 mg/300 mL  Status:  Discontinued        1,500 mg 150 mL/hr over 120 Minutes Intravenous Every 24 hours 09/21/20 1606 09/22/20 0858    09/21/20 2100  ceFEPIme (MAXIPIME) 2 g in sodium chloride 0.9 % 100 mL IVPB        2 g 200 mL/hr over 30 Minutes Intravenous Every 8 hours 09/21/20 1606     09/21/20 2000  metroNIDAZOLE (FLAGYL) IVPB 500 mg  Status:  Discontinued        500 mg 100 mL/hr over 60 Minutes Intravenous Every 8 hours 09/21/20 1553 09/22/20 0858   09/21/20 1300  vancomycin (VANCOREADY) IVPB 1500 mg/300 mL        1,500 mg 150 mL/hr over 120 Minutes Intravenous  Once 09/21/20 1155 09/21/20 1514   09/21/20 1200  ceFEPIme (MAXIPIME) 2 g in sodium chloride 0.9 % 100 mL IVPB        2 g 200 mL/hr over 30 Minutes Intravenous  Once 09/21/20 1155 09/21/20 1315   09/21/20 1145  metroNIDAZOLE (FLAGYL) IVPB 500 mg        500 mg 100 mL/hr over 60 Minutes Intravenous  Once 09/21/20 1133 09/21/20 1315        Subjective: Patient was seen and examined at bedside.  Overnight events noted.   He denies any abdominal pain, He has a biliary drain with good biliary output.   He denies any nausea and vomiting. Abdominal pain is resolved.  He was having breakfast.  Objective: Vitals:   09/24/20 1240 09/24/20 1245 09/24/20 1245 09/24/20 1316  BP: (!) 148/88 (!) 145/90  140/81  Pulse:    64  Resp: 12 (!) 22  17  Temp:    98.5 F (36.9 C)  TempSrc:    Oral  SpO2: 96%  96%   Weight:      Height:        Intake/Output Summary (Last 24 hours) at 09/24/2020 1531 Last data filed at 09/24/2020 1317 Gross per 24 hour  Intake 397.23 ml  Output 2575 ml  Net -2177.77 ml   Filed Weights   09/24/20 0725  Weight: 95 kg    Examination:  General exam: Appears calm and comfortable, not in any acute distress. Respiratory system: Clear to auscultation. Respiratory effort normal. Cardiovascular system: S1 & S2 heard, RRR. No JVD, murmurs, rubs, gallops or clicks. No pedal edema. Gastrointestinal system: Abdomen is nondistended, soft and nontender. No organomegaly or masses felt. Normal bowel sounds heard.  Biliary drain  noted. Central nervous system: Alert and oriented. No focal neurological deficits. Extremities: Symmetric 5 x 5 power.  No edema, no cyanosis, no clubbing. Skin: No rashes, lesions or ulcers Psychiatry: Judgement and insight appear normal. Mood & affect appropriate.     Data Reviewed: I have personally reviewed following labs and imaging studies  CBC: Recent Labs  Lab 09/21/20 1126 09/22/20 0414 09/23/20 0506 09/24/20 0620  WBC 12.7* 10.0 6.2 6.1  NEUTROABS 10.2*  --   --   --   HGB 9.7* 9.2* 8.8* 8.7*  HCT 28.4* 26.9* 26.1* 26.3*  MCV 85.0 84.9 85.0 84.0  PLT 155 105* 93* 580*   Basic Metabolic Panel: Recent Labs  Lab 09/21/20 1126 09/21/20 2020 09/22/20 0414 09/23/20 0506 09/24/20 0620  NA 123* 130*  132* 134* 131*  K 3.5 3.4* 3.6 4.0 3.7  CL 94* 105 108 109 105  CO2 19* 20* 21* 20* 20*  GLUCOSE 152* 130* 151* 103* 93  BUN 22 18 17 16 12   CREATININE 0.96 0.96 0.81 0.67 0.64  CALCIUM 7.8* 7.3* 7.2* 7.4* 7.5*   GFR: Estimated Creatinine Clearance: 80.9 mL/min (by C-G formula based on SCr of 0.64 mg/dL). Liver Function Tests: Recent Labs  Lab 09/21/20 1126 09/22/20 0414 09/23/20 0506 09/24/20 0620  AST 147* 103* 73* 60*  ALT 95* 74* 61* 55*  ALKPHOS 189* 165* 182* 246*  BILITOT 3.6* 3.3* 2.4* 2.4*  PROT 6.2* 5.1* 4.9* 5.2*  ALBUMIN 2.6* 2.1* 2.0* 2.0*   No results for input(s): LIPASE, AMYLASE in the last 168 hours. No results for input(s): AMMONIA in the last 168 hours. Coagulation Profile: Recent Labs  Lab 09/21/20 1126 09/22/20 0414 09/24/20 0620  INR 1.6* 1.8* 1.5*   Cardiac Enzymes: No results for input(s): CKTOTAL, CKMB, CKMBINDEX, TROPONINI in the last 168 hours. BNP (last 3 results) No results for input(s): PROBNP in the last 8760 hours. HbA1C: No results for input(s): HGBA1C in the last 72 hours. CBG: Recent Labs  Lab 09/22/20 0738 09/23/20 0742 09/24/20 0722  GLUCAP 97 85 72   Lipid Profile: No results for input(s): CHOL, HDL,  LDLCALC, TRIG, CHOLHDL, LDLDIRECT in the last 72 hours. Thyroid Function Tests: No results for input(s): TSH, T4TOTAL, FREET4, T3FREE, THYROIDAB in the last 72 hours. Anemia Panel: No results for input(s): VITAMINB12, FOLATE, FERRITIN, TIBC, IRON, RETICCTPCT in the last 72 hours. Sepsis Labs: Recent Labs  Lab 09/21/20 1126 09/21/20 1323 09/21/20 2023  LATICACIDVEN 2.6* 3.6* 1.2    Recent Results (from the past 240 hour(s))  Urine culture     Status: None   Collection Time: 09/19/20  6:10 PM   Specimen: Urine, Clean Catch  Result Value Ref Range Status   Specimen Description   Final    URINE, CLEAN CATCH Performed at [facility], [place] [address] [place], [place] [address]    Special Requests   Final    URINE, CLEAN CATCH Performed at [facility], [place] [address], [place], [place] [address]    Culture   Final    NO GROWTH Performed at [facility], [place] [address], [place], [place] [address]    Report Status 09/21/2020 FINAL  Final  Culture, blood (Routine x 2)     Status: Abnormal   Collection Time: 09/21/20 11:26 AM   Specimen: BLOOD  Result Value Ref Range Status   Specimen Description   Final    BLOOD BLOOD LEFT FOREARM Performed at [place] [address] [place], [place] [address]    Special Requests   Final    BOTTLES DRAWN AEROBIC AND ANAEROBIC Blood Culture results may not be optimal due to an inadequate volume of blood received in culture bottles Performed at [place] [address] [place], [place] [address]    Culture  Setup Time   Final    GRAM NEGATIVE RODS IN BOTH AEROBIC AND ANAEROBIC BOTTLES CRITICAL RESULT CALLED TO, READ BACK BY AND VERIFIED WITH: PHARMD Joyice Faster [phone] FCP Performed at [facility], [place] [address] [place], [place] [address]    Culture ENTEROBACTER AEROGENES (A)  Final   Report Status 09/24/2020 FINAL  Final   Organism ID,  Bacteria ENTEROBACTER AEROGENES  Final  Susceptibility   Enterobacter aerogenes - MIC*    CEFAZOLIN RESISTANT Resistant     CEFEPIME <=0.12 SENSITIVE Sensitive     CEFTAZIDIME <=1 SENSITIVE Sensitive     CEFTRIAXONE <=0.25 SENSITIVE Sensitive     CIPROFLOXACIN <=0.25 SENSITIVE Sensitive     GENTAMICIN <=1 SENSITIVE Sensitive     IMIPENEM <=0.25 SENSITIVE Sensitive     TRIMETH/SULFA <=20 SENSITIVE Sensitive     PIP/TAZO <=4 SENSITIVE Sensitive     * ENTEROBACTER AEROGENES  Blood Culture ID Panel (Reflexed)     Status: Abnormal   Collection Time: 09/21/20 11:26 AM  Result Value Ref Range Status   Enterococcus faecalis NOT DETECTED NOT DETECTED Final   Enterococcus Faecium NOT DETECTED NOT DETECTED Final   Listeria monocytogenes NOT DETECTED NOT DETECTED Final   Staphylococcus species NOT DETECTED NOT DETECTED Final   Staphylococcus aureus (BCID) NOT DETECTED NOT DETECTED Final   Staphylococcus epidermidis NOT DETECTED NOT DETECTED Final   Staphylococcus lugdunensis NOT DETECTED NOT DETECTED Final   Streptococcus species NOT DETECTED NOT DETECTED Final   Streptococcus agalactiae NOT DETECTED NOT DETECTED Final   Streptococcus pneumoniae NOT DETECTED NOT DETECTED Final   Streptococcus pyogenes NOT DETECTED NOT DETECTED Final   A.calcoaceticus-baumannii NOT DETECTED NOT DETECTED Final   Bacteroides fragilis NOT DETECTED NOT DETECTED Final   Enterobacterales DETECTED (A) NOT DETECTED Final    Comment: Enterobacterales represent a large order of gram negative bacteria, not a single organism. CRITICAL RESULT CALLED TO, READ BACK BY AND VERIFIED WITH: PHARMD NICK G. [phone] FCP    Enterobacter cloacae complex NOT DETECTED NOT DETECTED Final   Escherichia coli NOT DETECTED NOT DETECTED Final   Klebsiella aerogenes DETECTED (A) NOT DETECTED Final    Comment: CRITICAL RESULT CALLED TO, READ BACK BY AND VERIFIED WITH: PHARMD NICK G. [phone] FCP    Klebsiella oxytoca NOT  DETECTED NOT DETECTED Final   Klebsiella pneumoniae NOT DETECTED NOT DETECTED Final   Proteus species NOT DETECTED NOT DETECTED Final   Salmonella species NOT DETECTED NOT DETECTED Final   Serratia marcescens NOT DETECTED NOT DETECTED Final   Haemophilus influenzae NOT DETECTED NOT DETECTED Final   Neisseria meningitidis NOT DETECTED NOT DETECTED Final   Pseudomonas aeruginosa NOT DETECTED NOT DETECTED Final   Stenotrophomonas maltophilia NOT DETECTED NOT DETECTED Final   Candida albicans NOT DETECTED NOT DETECTED Final   Candida auris NOT DETECTED NOT DETECTED Final   Candida glabrata NOT DETECTED NOT DETECTED Final   Candida krusei NOT DETECTED NOT DETECTED Final   Candida parapsilosis NOT DETECTED NOT DETECTED Final   Candida tropicalis NOT DETECTED NOT DETECTED Final   Cryptococcus neoformans/gattii NOT DETECTED NOT DETECTED Final   CTX-M ESBL NOT DETECTED NOT DETECTED Final   Carbapenem resistance IMP NOT DETECTED NOT DETECTED Final   Carbapenem resistance KPC NOT DETECTED NOT DETECTED Final   Carbapenem resistance NDM NOT DETECTED NOT DETECTED Final   Carbapenem resist OXA 48 LIKE NOT DETECTED NOT DETECTED Final   Carbapenem resistance VIM NOT DETECTED NOT DETECTED Final    Comment: Performed at [facility], [address] [place], [place] [address]  Resp Panel by RT-PCR (Flu A&B, Covid) Nasopharyngeal Swab     Status: None   Collection Time: 09/21/20 11:33 AM   Specimen: Nasopharyngeal Swab; Nasopharyngeal(NP) swabs in vial transport medium  Result Value Ref Range Status   SARS Coronavirus 2 by RT PCR NEGATIVE NEGATIVE Final    Comment: (NOTE) SARS-CoV-2 target nucleic acids  are NOT DETECTED.  The SARS-CoV-2 RNA is generally detectable in upper respiratory specimens during the acute phase of infection. The lowest concentration of SARS-CoV-2 viral copies this assay can detect is 138 copies/mL. A negative result does not preclude SARS-Cov-2 infection and should not be  used as the sole basis for treatment or other patient management decisions. A negative result may occur with  improper specimen collection/handling, submission of specimen other than nasopharyngeal swab, presence of viral mutation(s) within the areas targeted by this assay, and inadequate number of viral copies(<138 copies/mL). A negative result must be combined with clinical observations, patient history, and epidemiological information. The expected result is Negative.  Fact Sheet for Patients:  EntrepreneurPulse.com.au  Fact Sheet for Healthcare Providers:  IncredibleEmployment.be  This test is no t yet approved or cleared by the [place] FDA and  has been authorized for detection and/or diagnosis of SARS-CoV-2 by FDA under an Emergency Use Authorization (EUA). This EUA will remain  in effect (meaning this test can be used) for the duration of the COVID-19 declaration under Section 564(b)(1) of the Act, 21 U.S.C.section 360bbb-3(b)(1), unless the authorization is terminated  or revoked sooner.       Influenza A by PCR NEGATIVE NEGATIVE Final   Influenza B by PCR NEGATIVE NEGATIVE Final    Comment: (NOTE) The Xpert Xpress SARS-CoV-2/FLU/RSV plus assay is intended as an aid in the diagnosis of influenza from Nasopharyngeal swab specimens and should not be used as a sole basis for treatment. Nasal washings and aspirates are unacceptable for Xpert Xpress SARS-CoV-2/FLU/RSV testing.  Fact Sheet for Patients: EntrepreneurPulse.com.au  Fact Sheet for Healthcare Providers: IncredibleEmployment.be  This test is not yet approved or cleared by the [place] FDA and has been authorized for detection and/or diagnosis of SARS-CoV-2 by FDA under an Emergency Use Authorization (EUA). This EUA will remain in effect (meaning this test can be used) for the duration of the COVID-19 declaration under Section  564(b)(1) of the Act, 21 U.S.C. section 360bbb-3(b)(1), unless the authorization is terminated or revoked.  Performed at [facility], [place] [address], [place], [place] [address]   Culture, blood (Routine x 2)     Status: Abnormal   Collection Time: 09/21/20 11:42 AM   Specimen: BLOOD  Result Value Ref Range Status   Specimen Description   Final    BLOOD PORTA CATH Performed at [place] [address], [place], [place] [address]    Special Requests   Final    BOTTLES DRAWN AEROBIC AND ANAEROBIC Blood Culture adequate volume Performed at [place] [address], [place], [place] [address]    Culture  Setup Time   Final    GRAM NEGATIVE RODS IN BOTH AEROBIC AND ANAEROBIC BOTTLES    Culture (A)  Final    ENTEROBACTER AEROGENES SUSCEPTIBILITIES PERFORMED ON PREVIOUS CULTURE WITHIN THE LAST 5 DAYS. Performed at [facility], [place] [address], [place], [place] [address]    Report Status 09/24/2020 FINAL  Final  Urine culture     Status: None   Collection Time: 09/21/20  2:45 PM   Specimen: In/Out Cath Urine  Result Value Ref Range Status   Specimen Description   Final    IN/OUT CATH URINE Performed at [place] [address] [place], [place] [address]    Special Requests   Final    NONE Performed at [facility], [place] [address], [place],  [address]  Culture   Final    NO GROWTH Performed at [facility], [place] [address] [place], [place] [address]    Report Status 09/23/2020 FINAL  Final  Body fluid culture w Gram Stain     Status: None (Preliminary result)   Collection Time: 09/21/20  3:15 PM   Specimen: BILE; Body Fluid  Result Value Ref Range Status   Specimen Description   Final    BILE Performed at [place] [address], [place], [place] [address]    Special Requests   Final    Immunocompromised Performed at  [facility], [place] [address] [place], [place] [address]    Gram Stain NO WBC SEEN RARE GRAM NEGATIVE RODS   Final   Culture   Final    RARE ENTEROBACTER AEROGENES MODERATE CANDIDA ALBICANS SUSCEPTIBILITIES TO FOLLOW Performed at [facility], [place] [address], [place], [place] [address]    Report Status PENDING  Incomplete  MRSA PCR Screening     Status: None   Collection Time: 09/21/20  4:44 PM  Result Value Ref Range Status   MRSA by PCR NEGATIVE NEGATIVE Final    Comment:        The GeneXpert MRSA Assay (FDA approved for NASAL specimens only), is one component of a comprehensive MRSA colonization surveillance program. It is not intended to diagnose MRSA infection nor to guide or monitor treatment for MRSA infections. Performed at [facility], [place] [address] [place], [place] [address]     Radiology Studies: IR Perc Cholecystostomy  Result Date: 09/24/2020 INDICATION: History of gastric cancer complicated by development of obstructive jaundice post internal/external biliary drainage catheter placement on 09/10/2020. Unfortunately, the patient returned to the hospital with sepsis with subsequent CT scan and right upper quadrant abdominal ultrasound demonstrated nodular wall thickening of the gallbladder with trace amount of pericholecystic fluid. Patient presents today for definitive cholangiogram via the biliary drainage catheter (to evaluate for patency of the cystic duct) as well as biliary drainage catheter exchange potential up sizing. EXAM: 1. FLUOROSCOPIC GUIDED BILIARY DRAINAGE CATHETER EXCHANGE AND UP SIZING 2. PERCUTANEOUS CHOLANGIOGRAM VIA EXISTING BILIARY DRAINAGE CATHETER. 3. ULTRASOUND AND FLUOROSCOPIC-GUIDED CHOLECYSTOSTOMY TUBE PLACEMENT COMPARISON:  Percutaneous biliary drainage catheter placement-09/10/2020 CT the chest, abdomen pelvis-09/21/2020; right upper quadrant abdominal ultrasound-09/21/2020 CT abdomen and pelvis -  09/10/2020 MEDICATIONS: The patient is currently admitted to the hospital and on intravenous antibiotics. Antibiotics were administered within an appropriate time frame prior to skin puncture. ANESTHESIA/SEDATION: Moderate (conscious) sedation was employed during this procedure. A total of Versed 2 mg and Fentanyl 100 mcg was administered intravenously. Moderate Sedation Time: 23 minutes. The patient's level of consciousness and vital signs were monitored continuously by radiology nursing throughout the procedure under my direct supervision. CONTRAST:  2mL OMNIPAQUE IOHEXOL 300 MG/ML SOLN - administered into the biliary tree and gallbladder lumen. FLUOROSCOPY TIME:  3 minutes, 42 seconds (696 mGy) COMPLICATIONS: None immediate. PROCEDURE: Informed written consent was obtained from the patient after a discussion of the risks, benefits and alternatives to treatment. Questions regarding the procedure were encouraged and answered. A timeout was performed prior to the initiation of the procedure. Sonographic evaluation was performed of the right quadrant demonstrating persistent nodular mural thickening of the gallbladder with minimal amount pericholecystic fluid. The right upper abdominal quadrant as well as the external portion of the existing biliary drainage catheter and surrounding skin were prepped and draped in the usual sterile fashion, and a sterile  drape was applied covering the operative field. Maximum barrier sterile technique with sterile gowns and gloves were used for the procedure. A timeout was performed prior to the initiation of the procedure. Local anesthesia was provided with 1% lidocaine with epinephrine. Cholangiogram was performed via the existing percutaneous biliary drainage catheter demonstrating appropriate position functionality of the drain with passage of contrast to the level of the duodenum. At this time, the external portion of the biliary drainage catheter was cut and cannulated with an  Amplatz wire which was advanced through the entirety of the catheter in coiled within the duodenum. Under intermittent fluoroscopic guidance, the drainage catheter was exchanged for a 9 French, 25 cm radiopaque tip vascular sheath and a definitive pullback sheath cholangiogram was performed however failed to definitively opacify the cystic duct suggesting cystic duct occlusion. Over the Amplatz wire, the vascular sheath was exchanged for a new, slightly larger now 12 [place] biliary drainage catheter with end coiled and locked within the duodenum. Attention was now paid towards placement cholecystostomy tube. Utilizing a transhepatic approach, a 22 gauge needle was advanced into the gallbladder under direct ultrasound guidance. An ultrasound image was saved for documentation purposes. Appropriate intraluminal puncture was confirmed with the efflux of bile and advancement of an 0.018 wire into the gallbladder lumen. The needle was exchanged for an Nebo set. A small amount of contrast was injected to confirm appropriate intraluminal positioning. Over a Benson wire, a 67.2-French Cook cholecystomy tube was advanced into the gallbladder fossa, coiled and locked. Bile was aspirated and a small amount of contrast was injected as several post procedural spot radiographic images were obtained in various obliquities. The catheter was secured to the skin with suture, connected to a drainage bag and a dressing was placed. The patient tolerated the procedure well without immediate post procedural complication. FINDINGS: Preprocedural spot fluoroscopic image demonstrates unchanged positioning of the percutaneous drainage catheter with radiopaque side marker overlying the central aspect of the left biliary hilum and coil overlying the at the location of the duodenum. Contrast injection demonstrates appropriate position functionality of the biliary drainage catheter. Despite definitive/formal sheath cholangiogram, the cystic  duct was not opacified suggestive of cystic duct occlusion and cholecystitis. After fluoroscopic guided exchange and up sizing the new now 31 [place] biliary drainage catheter is appropriately positioned with end coiled and locked within the duodenum. Under direct ultrasound fluoroscopic guidance, a 10 French cholecystostomy tube is appropriately positioned with end coiled within the gallbladder lumen. IMPRESSION: 1. Successful fluoroscopic guided exchange and up sizing of now 12 French percutaneous biliary drainage catheter. 2. Despite formal sheath cholangiogram, the cystic duct was not opacified which when correlated with patient's admission of sepsis and abnormal appearance of the gallbladder on CT and ultrasound is worrisome for concomitant cholecystitis. 3. Successful ultrasound and fluoroscopic guided placement of a 10.2 French cholecystostomy tube. PLAN: - Once patient's bilirubin has hit a nadir, the patient may return for repeat cholangiogram and potential initiation of a capping trial of the percutaneous biliary drainage catheter. Electronically Signed   By: Sandi Mariscal M.D.   On: 09/24/2020 13:54   IR US Guidance  Result Date: 09/24/2020 INDICATION: History of gastric cancer complicated by development of obstructive jaundice post internal/external biliary drainage catheter placement on 09/10/2020. Unfortunately, the patient returned to the hospital with sepsis with subsequent CT scan and right upper quadrant abdominal ultrasound demonstrated nodular wall thickening of the gallbladder with trace amount of pericholecystic fluid. Patient presents today for definitive cholangiogram via the biliary  drainage catheter (to evaluate for patency of the cystic duct) as well as biliary drainage catheter exchange potential up sizing. EXAM: 1. FLUOROSCOPIC GUIDED BILIARY DRAINAGE CATHETER EXCHANGE AND UP SIZING 2. PERCUTANEOUS CHOLANGIOGRAM VIA EXISTING BILIARY DRAINAGE CATHETER. 3. ULTRASOUND AND  FLUOROSCOPIC-GUIDED CHOLECYSTOSTOMY TUBE PLACEMENT COMPARISON:  Percutaneous biliary drainage catheter placement-09/10/2020 CT the chest, abdomen pelvis-09/21/2020; right upper quadrant abdominal ultrasound-09/21/2020 CT abdomen and pelvis - 09/10/2020 MEDICATIONS: The patient is currently admitted to the hospital and on intravenous antibiotics. Antibiotics were administered within an appropriate time frame prior to skin puncture. ANESTHESIA/SEDATION: Moderate (conscious) sedation was employed during this procedure. A total of Versed 2 mg and Fentanyl 100 mcg was administered intravenously. Moderate Sedation Time: 23 minutes. The patient's level of consciousness and vital signs were monitored continuously by radiology nursing throughout the procedure under my direct supervision. CONTRAST:  7mL OMNIPAQUE IOHEXOL 300 MG/ML SOLN - administered into the biliary tree and gallbladder lumen. FLUOROSCOPY TIME:  3 minutes, 42 seconds (662 mGy) COMPLICATIONS: None immediate. PROCEDURE: Informed written consent was obtained from the patient after a discussion of the risks, benefits and alternatives to treatment. Questions regarding the procedure were encouraged and answered. A timeout was performed prior to the initiation of the procedure. Sonographic evaluation was performed of the right quadrant demonstrating persistent nodular mural thickening of the gallbladder with minimal amount pericholecystic fluid. The right upper abdominal quadrant as well as the external portion of the existing biliary drainage catheter and surrounding skin were prepped and draped in the usual sterile fashion, and a sterile drape was applied covering the operative field. Maximum barrier sterile technique with sterile gowns and gloves were used for the procedure. A timeout was performed prior to the initiation of the procedure. Local anesthesia was provided with 1% lidocaine with epinephrine. Cholangiogram was performed via the existing percutaneous  biliary drainage catheter demonstrating appropriate position functionality of the drain with passage of contrast to the level of the duodenum. At this time, the external portion of the biliary drainage catheter was cut and cannulated with an Amplatz wire which was advanced through the entirety of the catheter in coiled within the duodenum. Under intermittent fluoroscopic guidance, the drainage catheter was exchanged for a 9 French, 25 cm radiopaque tip vascular sheath and a definitive pullback sheath cholangiogram was performed however failed to definitively opacify the cystic duct suggesting cystic duct occlusion. Over the Amplatz wire, the vascular sheath was exchanged for a new, slightly larger now 12 [place] biliary drainage catheter with end coiled and locked within the duodenum. Attention was now paid towards placement cholecystostomy tube. Utilizing a transhepatic approach, a 22 gauge needle was advanced into the gallbladder under direct ultrasound guidance. An ultrasound image was saved for documentation purposes. Appropriate intraluminal puncture was confirmed with the efflux of bile and advancement of an 0.018 wire into the gallbladder lumen. The needle was exchanged for an [place] set. A small amount of contrast was injected to confirm appropriate intraluminal positioning. Over a Benson wire, a 93.2-French Cook cholecystomy tube was advanced into the gallbladder fossa, coiled and locked. Bile was aspirated and a small amount of contrast was injected as several post procedural spot radiographic images were obtained in various obliquities. The catheter was secured to the skin with suture, connected to a drainage bag and a dressing was placed. The patient tolerated the procedure well without immediate post procedural complication. FINDINGS: Preprocedural spot fluoroscopic image demonstrates unchanged positioning of the percutaneous drainage catheter with radiopaque side marker overlying the central aspect  of  the left biliary hilum and coil overlying the at the location of the duodenum. Contrast injection demonstrates appropriate position functionality of the biliary drainage catheter. Despite definitive/formal sheath cholangiogram, the cystic duct was not opacified suggestive of cystic duct occlusion and cholecystitis. After fluoroscopic guided exchange and up sizing the new now 70 [place] biliary drainage catheter is appropriately positioned with end coiled and locked within the duodenum. Under direct ultrasound fluoroscopic guidance, a 10 French cholecystostomy tube is appropriately positioned with end coiled within the gallbladder lumen. IMPRESSION: 1. Successful fluoroscopic guided exchange and up sizing of now 12 French percutaneous biliary drainage catheter. 2. Despite formal sheath cholangiogram, the cystic duct was not opacified which when correlated with patient's admission of sepsis and abnormal appearance of the gallbladder on CT and ultrasound is worrisome for concomitant cholecystitis. 3. Successful ultrasound and fluoroscopic guided placement of a 10.2 French cholecystostomy tube. PLAN: - Once patient's bilirubin has hit a nadir, the patient may return for repeat cholangiogram and potential initiation of a capping trial of the percutaneous biliary drainage catheter. Electronically Signed   By: Sandi Mariscal M.D.   On: 09/24/2020 13:54   IR EXCHANGE BILIARY DRAIN  Result Date: 09/24/2020 INDICATION: History of gastric cancer complicated by development of obstructive jaundice post internal/external biliary drainage catheter placement on 09/10/2020. Unfortunately, the patient returned to the hospital with sepsis with subsequent CT scan and right upper quadrant abdominal ultrasound demonstrated nodular wall thickening of the gallbladder with trace amount of pericholecystic fluid. Patient presents today for definitive cholangiogram via the biliary drainage catheter (to evaluate for patency of the cystic  duct) as well as biliary drainage catheter exchange potential up sizing. EXAM: 1. FLUOROSCOPIC GUIDED BILIARY DRAINAGE CATHETER EXCHANGE AND UP SIZING 2. PERCUTANEOUS CHOLANGIOGRAM VIA EXISTING BILIARY DRAINAGE CATHETER. 3. ULTRASOUND AND FLUOROSCOPIC-GUIDED CHOLECYSTOSTOMY TUBE PLACEMENT COMPARISON:  Percutaneous biliary drainage catheter placement-09/10/2020 CT the chest, abdomen pelvis-09/21/2020; right upper quadrant abdominal ultrasound-09/21/2020 CT abdomen and pelvis - 09/10/2020 MEDICATIONS: The patient is currently admitted to the hospital and on intravenous antibiotics. Antibiotics were administered within an appropriate time frame prior to skin puncture. ANESTHESIA/SEDATION: Moderate (conscious) sedation was employed during this procedure. A total of Versed 2 mg and Fentanyl 100 mcg was administered intravenously. Moderate Sedation Time: 23 minutes. The patient's level of consciousness and vital signs were monitored continuously by radiology nursing throughout the procedure under my direct supervision. CONTRAST:  22mL OMNIPAQUE IOHEXOL 300 MG/ML SOLN - administered into the biliary tree and gallbladder lumen. FLUOROSCOPY TIME:  3 minutes, 42 seconds (030 mGy) COMPLICATIONS: None immediate. PROCEDURE: Informed written consent was obtained from the patient after a discussion of the risks, benefits and alternatives to treatment. Questions regarding the procedure were encouraged and answered. A timeout was performed prior to the initiation of the procedure. Sonographic evaluation was performed of the right quadrant demonstrating persistent nodular mural thickening of the gallbladder with minimal amount pericholecystic fluid. The right upper abdominal quadrant as well as the external portion of the existing biliary drainage catheter and surrounding skin were prepped and draped in the usual sterile fashion, and a sterile drape was applied covering the operative field. Maximum barrier sterile technique with  sterile gowns and gloves were used for the procedure. A timeout was performed prior to the initiation of the procedure. Local anesthesia was provided with 1% lidocaine with epinephrine. Cholangiogram was performed via the existing percutaneous biliary drainage catheter demonstrating appropriate position functionality of the drain with passage of contrast to the level of the duodenum. At this  time, the external portion of the biliary drainage catheter was cut and cannulated with an Amplatz wire which was advanced through the entirety of the catheter in coiled within the duodenum. Under intermittent fluoroscopic guidance, the drainage catheter was exchanged for a 9 French, 25 cm radiopaque tip vascular sheath and a definitive pullback sheath cholangiogram was performed however failed to definitively opacify the cystic duct suggesting cystic duct occlusion. Over the Amplatz wire, the vascular sheath was exchanged for a new, slightly larger now 12 [place] biliary drainage catheter with end coiled and locked within the duodenum. Attention was now paid towards placement cholecystostomy tube. Utilizing a transhepatic approach, a 22 gauge needle was advanced into the gallbladder under direct ultrasound guidance. An ultrasound image was saved for documentation purposes. Appropriate intraluminal puncture was confirmed with the efflux of bile and advancement of an 0.018 wire into the gallbladder lumen. The needle was exchanged for an [place] set. A small amount of contrast was injected to confirm appropriate intraluminal positioning. Over a Benson wire, a 11.2-French Cook cholecystomy tube was advanced into the gallbladder fossa, coiled and locked. Bile was aspirated and a small amount of contrast was injected as several post procedural spot radiographic images were obtained in various obliquities. The catheter was secured to the skin with suture, connected to a drainage bag and a dressing was placed. The patient tolerated  the procedure well without immediate post procedural complication. FINDINGS: Preprocedural spot fluoroscopic image demonstrates unchanged positioning of the percutaneous drainage catheter with radiopaque side marker overlying the central aspect of the left biliary hilum and coil overlying the at the location of the duodenum. Contrast injection demonstrates appropriate position functionality of the biliary drainage catheter. Despite definitive/formal sheath cholangiogram, the cystic duct was not opacified suggestive of cystic duct occlusion and cholecystitis. After fluoroscopic guided exchange and up sizing the new now 41 [place] biliary drainage catheter is appropriately positioned with end coiled and locked within the duodenum. Under direct ultrasound fluoroscopic guidance, a 10 French cholecystostomy tube is appropriately positioned with end coiled within the gallbladder lumen. IMPRESSION: 1. Successful fluoroscopic guided exchange and up sizing of now 12 French percutaneous biliary drainage catheter. 2. Despite formal sheath cholangiogram, the cystic duct was not opacified which when correlated with patient's admission of sepsis and abnormal appearance of the gallbladder on CT and ultrasound is worrisome for concomitant cholecystitis. 3. Successful ultrasound and fluoroscopic guided placement of a 10.2 French cholecystostomy tube. PLAN: - Once patient's bilirubin has hit a nadir, the patient may return for repeat cholangiogram and potential initiation of a capping trial of the percutaneous biliary drainage catheter. Electronically Signed   By: Sandi Mariscal M.D.   On: 09/24/2020 13:54    Scheduled Meds:  chlorhexidine  15 mL Mouth Rinse BID   Chlorhexidine Gluconate Cloth  6 each Topical Daily   dorzolamide-timolol  1 drop Both Eyes BID   fentaNYL       fluconazole  400 mg Oral Daily   latanoprost  1 drop Both Eyes QHS   lidocaine       mouth rinse  15 mL Mouth Rinse q12n4p   midazolam       sodium  chloride flush  10-40 mL Intracatheter Q12H   sodium chloride flush  5 mL Intracatheter Q8H   Continuous Infusions:  sodium chloride 125 mL/hr at 09/24/20 0511   ceFEPime (MAXIPIME) IV 2 g (09/24/20 1445)   norepinephrine (LEVOPHED) Adult infusion Stopped (09/22/20 0956)     LOS: 3 days  Time spent: 25 mins    Shawna Clamp, MD [facility]   If 7PM-7AM, please contact night-coverage

## 2020-09-24 NOTE — Progress Notes (Signed)
   09/24/20 1146  Mobility  Activity Ambulated in hall  Level of Assistance Contact guard assist, steadying assist  Assistive Device Other (Comment) (IV pole and hand hald assist)  Distance Ambulated (ft) 100 ft  Mobility Ambulated with assistance in hallway  Mobility Response Tolerated well  Mobility performed by Mobility specialist  $Mobility charge 1 Mobility    Pt agreed to ambulation in hallway. Was staggering a little. Pushed IV pole for assistance. Pt stated he was experiencing some weakness. No other complaints. Left in chair with alarm.   [place] Specialist Acute Rehab Services Office: [phone]

## 2020-09-25 ENCOUNTER — Ambulatory Visit
Admission: RE | Admit: 2020-09-25 | Discharge: 2020-09-25 | Disposition: A | Discharge status: Home / Self Care | Payer: Medicare PPO | Source: Ambulatory Visit | Attending: Radiation Oncology | Admitting: Radiation Oncology

## 2020-09-25 ENCOUNTER — Encounter (HOSPITAL_COMMUNITY): Payer: Self-pay | Admitting: Radiology

## 2020-09-25 LAB — COMPREHENSIVE METABOLIC PANEL
ALT: 44 U/L (ref 0–44)
AST: 41 U/L (ref 15–41)
Albumin: 1.9 g/dL — ABNORMAL LOW (ref 3.5–5.0)
Alkaline Phosphatase: 237 U/L — ABNORMAL HIGH (ref 38–126)
Anion gap: 4 — ABNORMAL LOW (ref 5–15)
BUN: 10 mg/dL (ref 8–23)
CO2: 22 mmol/L (ref 22–32)
Calcium: 7.6 mg/dL — ABNORMAL LOW (ref 8.9–10.3)
Chloride: 108 mmol/L (ref 98–111)
Creatinine, Ser: 0.67 mg/dL (ref 0.61–1.24)
GFR, Estimated: >60 mL/min (ref >60)
Glucose, Bld: 95 mg/dL (ref 70–99)
Potassium: 3.8 mmol/L (ref 3.5–5.1)
Sodium: 134 mmol/L — ABNORMAL LOW (ref 135–145)
Total Bilirubin: 2.2 mg/dL — ABNORMAL HIGH (ref 0.3–1.2)
Total Protein: 4.9 g/dL — ABNORMAL LOW (ref 6.5–8.1)

## 2020-09-25 LAB — BODY FLUID CULTURE W GRAM STAIN: Gram Stain: NONE SEEN

## 2020-09-25 LAB — CBC
HCT: 26 % — ABNORMAL LOW (ref 39.0–52.0)
Hemoglobin: 8.7 g/dL — ABNORMAL LOW (ref 13.0–17.0)
MCH: 28.7 pg (ref 26.0–34.0)
MCHC: 33.5 g/dL (ref 30.0–36.0)
MCV: 85.8 fL (ref 80.0–100.0)
Platelets: 166 10*3/uL (ref 150–400)
RBC: 3.03 MIL/uL — ABNORMAL LOW (ref 4.22–5.81)
RDW: 14.1 % (ref 11.5–15.5)
WBC: 5.7 10*3/uL (ref 4.0–10.5)
nRBC: 0 % (ref 0.0–0.2)

## 2020-09-25 LAB — GLUCOSE, CAPILLARY: Glucose-Capillary: 100 mg/dL — ABNORMAL HIGH (ref 70–99)

## 2020-09-25 MED ORDER — ENSURE MAX PROTEIN PO LIQD
11.0000 [oz_av] | Freq: Three times a day (TID) | ORAL | Status: DC
  Administered 2020-09-25 – 2020-09-30 (×13): 11 [oz_av] via ORAL
  Filled 2020-09-25 (×18): qty 330

## 2020-09-25 NOTE — Progress Notes (Signed)
ID Brief Note  Patient was not seen today, he was in radiation, chart reviewed  Patient remains afebrile, no leukocytosis Patient had fluoroscopy guided exchange and upsizing of percutaneous biliary drain  Of note, Enterobacter aerogenes grew from the bile culture as well as blood from Port-A-Cath culture  Recommendations Continue cefepime while he is in the hospital, can de-escalate to Bactrim when he is ready for discharge.  Total duration of therapy IV plus p.o. 14 (given implanted port) Continue fluconazole for total 7 days ID will sign off at this time   Rosiland Oz, MD Infectious Disease Physician [facility] [address] [place], [place] [address] Phone: [phone]  Fax: [phone]

## 2020-09-25 NOTE — Progress Notes (Signed)
Pt here for patient teaching.    Pt given Radiation and You booklet and skin care instructions.    Reviewed areas of pertinence such as diarrhea, fatigue, hair loss, nausea and vomiting, skin changes, and urinary and bladder changes .   Pt able to give teach back of to pat skin, use unscented/gentle soap, use baby wipes, have Imodium on hand, and drink plenty of water,avoid applying anything to skin within 4 hours of treatment.   Pt verbalizes understanding of information given and will contact nursing with any questions or concerns.    [email or web address]

## 2020-09-25 NOTE — Progress Notes (Addendum)
Referring Physician(s): Dr. Dwyane Dee; Dr. Marin Olp   Supervising Physician: Aletta Edouard  Patient Status:  [facility] - In-pt  Chief Complaint:  S/p bili drain exchange/upsize and perc choley placement with Dr. Pascal Lux on 6/23   Subjective:  Patient sitting in bed eating lunch. Wife at the bedside.  Went for radiation therapy today, it went well.  No complaints regarding the drains.  Denies N/V abdominal pain.   Allergies: Patient has no known allergies.  Medications: Prior to Admission medications   Medication Sig Start Date End Date Taking? Authorizing Provider  acetaminophen (TYLENOL) 160 MG/5ML liquid Take 500 mg by mouth every 4 (four) hours as needed for fever.   Yes [provider]  atorvastatin (LIPITOR) 20 MG tablet Take 1 tablet (20 mg total) by mouth daily. Patient taking differently: Take 20 mg by mouth in the morning. 04/26/20 04/26/21 Yes Biagio Borg, MD  cephALEXin (KEFLEX) 500 MG capsule Take 1 capsule (500 mg total) by mouth 4 (four) times daily. Patient taking differently: Take 500 mg by mouth 4 (four) times daily. Start date : 09/20/20 09/19/20  Yes Sherrill Raring, PA-C  dorzolamide-timolol (COSOPT) 22.3-6.8 MG/ML ophthalmic solution Place 1 drop into both eyes 2 (two) times daily. 06/15/20  Yes [provider]  famotidine (PEPCID) 20 MG tablet TAKE 1 TABLET BY MOUTH EVERY DAY Patient taking differently: Take 20 mg by mouth daily. 09/10/20  Yes Ennever, Rudell Cobb, MD  latanoprost (XALATAN) 0.005 % ophthalmic solution Place 1 drop into both eyes at bedtime.  01/09/19  Yes [provider]  lidocaine-prilocaine (EMLA) cream Apply 1 application topically as needed. Place on the port one hour before appointment. Patient taking differently: Apply 1 application topically daily as needed (port access). Place on the port one hour before appointment. 08/24/20  Yes Volanda Napoleon, MD  meclizine (ANTIVERT) 12.5 MG tablet TAKE 1 TABLET BY MOUTH 3 TIMES DAILY AS  NEEDED FOR DIZZINESS. Patient taking differently: Take 12.5 mg by mouth daily as needed for dizziness. 08/10/20  Yes Biagio Borg, MD  Multiple Vitamin (MULTIVITAMIN WITH MINERALS) TABS tablet Take 1 tablet by mouth in the morning. Men's One-A-Day Chewable   Yes [provider]  ondansetron (ZOFRAN) 8 MG tablet Take 1 tablet (8 mg total) by mouth every 8 (eight) hours as needed for nausea or vomiting. 09/16/20  Yes Hayden Pedro, PA-C  senna-docusate (SENOKOT S) 8.6-50 MG tablet Take 1 tablet by mouth daily. 09/12/20  Yes Domenic Polite, MD  SYSTANE ULTRA 0.4-0.3 % SOLN Place 1 drop into both eyes 3 (three) times daily as needed (dry eyes). 01/09/19  Yes [provider]  traMADol (ULTRAM) 50 MG tablet Take 2 tablets (100 mg total) by mouth at bedtime as needed for moderate pain or severe pain. 04/22/20  Yes Biagio Borg, MD     Vital Signs: BP (!) 154/78   Pulse 67   Temp 98.3 F (36.8 C) (Oral)   Resp 18   Ht 5\' 8"  (1.727 m)   Wt 209 lb 7 oz (95 kg)   SpO2 98%   BMI 31.84 kg/m   Physical Exam Vitals reviewed.  Constitutional:      General: He is not in acute distress.    Appearance: Normal appearance.  HENT:     Head: Normocephalic and atraumatic.  Cardiovascular:     Rate and Rhythm: Normal rate.  Pulmonary:     Effort: Pulmonary effort is normal.  Abdominal:     General:  Abdomen is flat.     Palpations: Abdomen is soft.  Skin:    General: Skin is warm and dry.     Coloration: Skin is not jaundiced or pale.     Comments: Positive RUQ drain to a gravity bag. Site is unremarkable with no erythema, edema, tenderness, bleeding or drainage. Suture and stat lock in place. Dressing is clean, dry, and intact. 10 ml of  bile colored fluid noted in the gravity bag. Drain aspirates and flushes well.   Positive RUQ (more posterior and inferior) drain to a gravity bag. Site is unremarkable with no erythema, edema, tenderness, bleeding or drainage. Suture and stat  lock in place. Dressing is clean, dry, and intact. 20 ml of  bile colored fluid noted in the gravity bag. Drain aspirates and flushes well.    Neurological:     Mental Status: He is alert.    Imaging: DG Chest 2 View  Result Date: 09/21/2020 CLINICAL DATA:  Weakness and fever for 2 weeks. EXAM: CHEST - 2 VIEW COMPARISON:  03/26/2019 FINDINGS: The cardiomediastinal silhouette is unremarkable. Mild peribronchial thickening again noted. A RIGHT Port-A-Cath is again noted with tip overlying the SUPERIOR cavoatrial junction. There is no evidence of focal airspace disease, pulmonary edema, suspicious pulmonary nodule/mass, pleural effusion, or pneumothorax. No acute bony abnormalities are identified. IMPRESSION: No active cardiopulmonary disease. Electronically Signed   By: Margarette Canada M.D.   On: 09/21/2020 12:23   CT CHEST ABDOMEN PELVIS W CONTRAST  Result Date: 09/21/2020 CLINICAL DATA:  Abdominal pain, fever, weakness. Urinary tract infection. Gastrointestinal cancer. EXAM: CT CHEST, ABDOMEN, AND PELVIS WITH CONTRAST TECHNIQUE: Multidetector CT imaging of the chest, abdomen and pelvis was performed following the standard protocol during bolus administration of intravenous contrast. CONTRAST:  163mL OMNIPAQUE IOHEXOL 300 MG/ML  SOLN COMPARISON:  CT abdomen pelvis 09/10/2020 and CT chest 06/12/2020. MR abdomen 09/04/2020 and PET 06/25/2020 FINDINGS: CT CHEST FINDINGS Cardiovascular: Right IJ Port-A-Cath terminates at the SVC RA junction. Heart is at the upper limits of normal in size. No pericardial effusion. Mediastinum/Nodes: Mediastinal lymph nodes measure up to 11 mm in the low right paratracheal station, similar. Calcified mediastinal lymph nodes. No hilar or axillary adenopathy. Esophagus is grossly. Lungs/Pleura: Centrilobular and paraseptal emphysema. Calcified granulomas. Minimal dependent atelectasis bilaterally. No pleural fluid. Adherent debris in the right mainstem bronchus. Musculoskeletal:  Degenerative changes in the spine. No worrisome lytic or sclerotic lesions. CT ABDOMEN PELVIS FINDINGS Hepatobiliary: Similar biliary ductal dilatation with a percutaneous biliary drainage catheter in place, terminating in the duodenum. Associated centrally obstructing mass, better seen on 09/04/2020. 9 mm low-attenuation lesion in the periphery of segment 4 (2/65), unchanged and too small to characterize. Other subcapsular metastases seen on 09/03/2020 are not readily appreciated. Gallbladder is dilated. Pancreas: Mass seen in the pancreatic body on 09/04/2020 is poorly appreciated. Spleen: Negative. Adrenals/Urinary Tract: Adrenal glands are unremarkable. Right renal stone. Kidneys are otherwise unremarkable. Ureters are decompressed. Bladder may be minimally thick-walled. Stomach/Bowel: Distal gastrectomy. Stomach, small bowel and appendix are otherwise unremarkable. Fluid seen in the colon. Vascular/Lymphatic: Vascular structures are unremarkable. No pathologically enlarged lymph nodes. Reproductive: Prostate is visualized. Other: Bilateral omental nodules measure up to 1.4 cm, as on 09/10/2020. Mesenteric nodules measure up to 8 mm in short axis (2/86). No free fluid. Left inguinal hernia contains fat. Musculoskeletal: Degenerative changes in the spine. IMPRESSION: 1. No findings to explain the patient's acute clinical history. 2. Peritoneal/omental metastases, as on 09/10/2020. 3. Percutaneous biliary drain in place with  similar gallbladder and biliary ductal dilatation. Associated obstructing mass seen on 09/04/2020 is better seen on that study. 4. Pancreatic body mass, also better seen on 09/04/2020. 5. Right renal stone. Electronically Signed   By: Lorin Picket M.D.   On: 09/21/2020 14:47   IR Perc Cholecystostomy  Result Date: 09/24/2020 INDICATION: History of gastric cancer complicated by development of obstructive jaundice post internal/external biliary drainage catheter placement on 09/10/2020.  Unfortunately, the patient returned to the hospital with sepsis with subsequent CT scan and right upper quadrant abdominal ultrasound demonstrated nodular wall thickening of the gallbladder with trace amount of pericholecystic fluid. Patient presents today for definitive cholangiogram via the biliary drainage catheter (to evaluate for patency of the cystic duct) as well as biliary drainage catheter exchange potential up sizing. EXAM: 1. FLUOROSCOPIC GUIDED BILIARY DRAINAGE CATHETER EXCHANGE AND UP SIZING 2. PERCUTANEOUS CHOLANGIOGRAM VIA EXISTING BILIARY DRAINAGE CATHETER. 3. ULTRASOUND AND FLUOROSCOPIC-GUIDED CHOLECYSTOSTOMY TUBE PLACEMENT COMPARISON:  Percutaneous biliary drainage catheter placement-09/10/2020 CT the chest, abdomen pelvis-09/21/2020; right upper quadrant abdominal ultrasound-09/21/2020 CT abdomen and pelvis - 09/10/2020 MEDICATIONS: The patient is currently admitted to the hospital and on intravenous antibiotics. Antibiotics were administered within an appropriate time frame prior to skin puncture. ANESTHESIA/SEDATION: Moderate (conscious) sedation was employed during this procedure. A total of Versed 2 mg and Fentanyl 100 mcg was administered intravenously. Moderate Sedation Time: 23 minutes. The patient's level of consciousness and vital signs were monitored continuously by radiology nursing throughout the procedure under my direct supervision. CONTRAST:  94mL OMNIPAQUE IOHEXOL 300 MG/ML SOLN - administered into the biliary tree and gallbladder lumen. FLUOROSCOPY TIME:  3 minutes, 42 seconds (998 mGy) COMPLICATIONS: None immediate. PROCEDURE: Informed written consent was obtained from the patient after a discussion of the risks, benefits and alternatives to treatment. Questions regarding the procedure were encouraged and answered. A timeout was performed prior to the initiation of the procedure. Sonographic evaluation was performed of the right quadrant demonstrating persistent nodular mural  thickening of the gallbladder with minimal amount pericholecystic fluid. The right upper abdominal quadrant as well as the external portion of the existing biliary drainage catheter and surrounding skin were prepped and draped in the usual sterile fashion, and a sterile drape was applied covering the operative field. Maximum barrier sterile technique with sterile gowns and gloves were used for the procedure. A timeout was performed prior to the initiation of the procedure. Local anesthesia was provided with 1% lidocaine with epinephrine. Cholangiogram was performed via the existing percutaneous biliary drainage catheter demonstrating appropriate position functionality of the drain with passage of contrast to the level of the duodenum. At this time, the external portion of the biliary drainage catheter was cut and cannulated with an Amplatz wire which was advanced through the entirety of the catheter in coiled within the duodenum. Under intermittent fluoroscopic guidance, the drainage catheter was exchanged for a 9 French, 25 cm radiopaque tip vascular sheath and a definitive pullback sheath cholangiogram was performed however failed to definitively opacify the cystic duct suggesting cystic duct occlusion. Over the Amplatz wire, the vascular sheath was exchanged for a new, slightly larger now 12 [place] biliary drainage catheter with end coiled and locked within the duodenum. Attention was now paid towards placement cholecystostomy tube. Utilizing a transhepatic approach, a 22 gauge needle was advanced into the gallbladder under direct ultrasound guidance. An ultrasound image was saved for documentation purposes. Appropriate intraluminal puncture was confirmed with the efflux of bile and advancement of an 0.018 wire into the gallbladder lumen.  The needle was exchanged for an [place] set. A small amount of contrast was injected to confirm appropriate intraluminal positioning. Over a Benson wire, a 69.2-French Cook  cholecystomy tube was advanced into the gallbladder fossa, coiled and locked. Bile was aspirated and a small amount of contrast was injected as several post procedural spot radiographic images were obtained in various obliquities. The catheter was secured to the skin with suture, connected to a drainage bag and a dressing was placed. The patient tolerated the procedure well without immediate post procedural complication. FINDINGS: Preprocedural spot fluoroscopic image demonstrates unchanged positioning of the percutaneous drainage catheter with radiopaque side marker overlying the central aspect of the left biliary hilum and coil overlying the at the location of the duodenum. Contrast injection demonstrates appropriate position functionality of the biliary drainage catheter. Despite definitive/formal sheath cholangiogram, the cystic duct was not opacified suggestive of cystic duct occlusion and cholecystitis. After fluoroscopic guided exchange and up sizing the new now 38 [place] biliary drainage catheter is appropriately positioned with end coiled and locked within the duodenum. Under direct ultrasound fluoroscopic guidance, a 10 French cholecystostomy tube is appropriately positioned with end coiled within the gallbladder lumen. IMPRESSION: 1. Successful fluoroscopic guided exchange and up sizing of now 12 French percutaneous biliary drainage catheter. 2. Despite formal sheath cholangiogram, the cystic duct was not opacified which when correlated with patient's admission of sepsis and abnormal appearance of the gallbladder on CT and ultrasound is worrisome for concomitant cholecystitis. 3. Successful ultrasound and fluoroscopic guided placement of a 10.2 French cholecystostomy tube. PLAN: - Once patient's bilirubin has hit a nadir, the patient may return for repeat cholangiogram and potential initiation of a capping trial of the percutaneous biliary drainage catheter. Electronically Signed   By: Sandi Mariscal M.D.    On: 09/24/2020 13:54   IR US Guidance  Result Date: 09/24/2020 INDICATION: History of gastric cancer complicated by development of obstructive jaundice post internal/external biliary drainage catheter placement on 09/10/2020. Unfortunately, the patient returned to the hospital with sepsis with subsequent CT scan and right upper quadrant abdominal ultrasound demonstrated nodular wall thickening of the gallbladder with trace amount of pericholecystic fluid. Patient presents today for definitive cholangiogram via the biliary drainage catheter (to evaluate for patency of the cystic duct) as well as biliary drainage catheter exchange potential up sizing. EXAM: 1. FLUOROSCOPIC GUIDED BILIARY DRAINAGE CATHETER EXCHANGE AND UP SIZING 2. PERCUTANEOUS CHOLANGIOGRAM VIA EXISTING BILIARY DRAINAGE CATHETER. 3. ULTRASOUND AND FLUOROSCOPIC-GUIDED CHOLECYSTOSTOMY TUBE PLACEMENT COMPARISON:  Percutaneous biliary drainage catheter placement-09/10/2020 CT the chest, abdomen pelvis-09/21/2020; right upper quadrant abdominal ultrasound-09/21/2020 CT abdomen and pelvis - 09/10/2020 MEDICATIONS: The patient is currently admitted to the hospital and on intravenous antibiotics. Antibiotics were administered within an appropriate time frame prior to skin puncture. ANESTHESIA/SEDATION: Moderate (conscious) sedation was employed during this procedure. A total of Versed 2 mg and Fentanyl 100 mcg was administered intravenously. Moderate Sedation Time: 23 minutes. The patient's level of consciousness and vital signs were monitored continuously by radiology nursing throughout the procedure under my direct supervision. CONTRAST:  38mL OMNIPAQUE IOHEXOL 300 MG/ML SOLN - administered into the biliary tree and gallbladder lumen. FLUOROSCOPY TIME:  3 minutes, 42 seconds (423 mGy) COMPLICATIONS: None immediate. PROCEDURE: Informed written consent was obtained from the patient after a discussion of the risks, benefits and alternatives to treatment.  Questions regarding the procedure were encouraged and answered. A timeout was performed prior to the initiation of the procedure. Sonographic evaluation was performed of the right quadrant demonstrating persistent  nodular mural thickening of the gallbladder with minimal amount pericholecystic fluid. The right upper abdominal quadrant as well as the external portion of the existing biliary drainage catheter and surrounding skin were prepped and draped in the usual sterile fashion, and a sterile drape was applied covering the operative field. Maximum barrier sterile technique with sterile gowns and gloves were used for the procedure. A timeout was performed prior to the initiation of the procedure. Local anesthesia was provided with 1% lidocaine with epinephrine. Cholangiogram was performed via the existing percutaneous biliary drainage catheter demonstrating appropriate position functionality of the drain with passage of contrast to the level of the duodenum. At this time, the external portion of the biliary drainage catheter was cut and cannulated with an Amplatz wire which was advanced through the entirety of the catheter in coiled within the duodenum. Under intermittent fluoroscopic guidance, the drainage catheter was exchanged for a 9 French, 25 cm radiopaque tip vascular sheath and a definitive pullback sheath cholangiogram was performed however failed to definitively opacify the cystic duct suggesting cystic duct occlusion. Over the Amplatz wire, the vascular sheath was exchanged for a new, slightly larger now 12 [place] biliary drainage catheter with end coiled and locked within the duodenum. Attention was now paid towards placement cholecystostomy tube. Utilizing a transhepatic approach, a 22 gauge needle was advanced into the gallbladder under direct ultrasound guidance. An ultrasound image was saved for documentation purposes. Appropriate intraluminal puncture was confirmed with the efflux of bile and  advancement of an 0.018 wire into the gallbladder lumen. The needle was exchanged for an [place] set. A small amount of contrast was injected to confirm appropriate intraluminal positioning. Over a Benson wire, a 34.2-French Cook cholecystomy tube was advanced into the gallbladder fossa, coiled and locked. Bile was aspirated and a small amount of contrast was injected as several post procedural spot radiographic images were obtained in various obliquities. The catheter was secured to the skin with suture, connected to a drainage bag and a dressing was placed. The patient tolerated the procedure well without immediate post procedural complication. FINDINGS: Preprocedural spot fluoroscopic image demonstrates unchanged positioning of the percutaneous drainage catheter with radiopaque side marker overlying the central aspect of the left biliary hilum and coil overlying the at the location of the duodenum. Contrast injection demonstrates appropriate position functionality of the biliary drainage catheter. Despite definitive/formal sheath cholangiogram, the cystic duct was not opacified suggestive of cystic duct occlusion and cholecystitis. After fluoroscopic guided exchange and up sizing the new now 72 [place] biliary drainage catheter is appropriately positioned with end coiled and locked within the duodenum. Under direct ultrasound fluoroscopic guidance, a 10 French cholecystostomy tube is appropriately positioned with end coiled within the gallbladder lumen. IMPRESSION: 1. Successful fluoroscopic guided exchange and up sizing of now 12 French percutaneous biliary drainage catheter. 2. Despite formal sheath cholangiogram, the cystic duct was not opacified which when correlated with patient's admission of sepsis and abnormal appearance of the gallbladder on CT and ultrasound is worrisome for concomitant cholecystitis. 3. Successful ultrasound and fluoroscopic guided placement of a 10.2 French cholecystostomy tube.  PLAN: - Once patient's bilirubin has hit a nadir, the patient may return for repeat cholangiogram and potential initiation of a capping trial of the percutaneous biliary drainage catheter. Electronically Signed   By: Sandi Mariscal M.D.   On: 09/24/2020 13:54   ECHOCARDIOGRAM COMPLETE  Result Date: 09/22/2020    ECHOCARDIOGRAM REPORT   Patient Name:   Jonathan Lewis Date of Exam: 09/22/2020  Medical Rec #:  [id number]        Height:       68.0 in Accession #:    [id number]       Weight:       174.4 lb Date of Birth:  05-06-1940        BSA:          1.928 m Patient Age:    80 years         BP:           116/82 mmHg Patient Gender: M                HR:           66 bpm. Exam Location:  Inpatient Procedure: 2D Echo, Color Doppler and Cardiac Doppler Indications:    Bacteremia  History:        Patient has prior history of Echocardiogram examinations, most                 recent 05/06/2015. Risk Factors:Diabetes, Hypertension and                 Dyslipidemia.  Sonographer:    Bernadene Person RDCS Referring Phys: [place]  1. Left ventricular ejection fraction, by estimation, is 40 to 45%. The left ventricle has mildly decreased function. The left ventricle demonstrates global hypokinesis. Left ventricular diastolic parameters are consistent with Grade I diastolic dysfunction (impaired relaxation).  2. Right ventricular systolic function is normal. The right ventricular size is normal. Tricuspid regurgitation signal is inadequate for assessing PA pressure.  3. The mitral valve is normal in structure. Mild mitral valve regurgitation. No evidence of mitral stenosis.  4. The aortic valve is tricuspid. Aortic valve regurgitation is not visualized. No aortic stenosis is present.  5. Aortic dilatation noted. There is borderline dilatation of the aortic root, measuring 39 mm.  6. The inferior vena cava is normal in size with greater than 50% respiratory variability, suggesting right atrial pressure of 3  mmHg. FINDINGS  Left Ventricle: Left ventricular ejection fraction, by estimation, is 40 to 45%. The left ventricle has mildly decreased function. The left ventricle demonstrates global hypokinesis. The left ventricular internal cavity size was normal in size. There is  no left ventricular hypertrophy. Left ventricular diastolic parameters are consistent with Grade I diastolic dysfunction (impaired relaxation). Right Ventricle: The right ventricular size is normal. Right ventricular systolic function is normal. Tricuspid regurgitation signal is inadequate for assessing PA pressure. The tricuspid regurgitant velocity is 1.44 m/s, and with an assumed right atrial  pressure of 3 mmHg, the estimated right ventricular systolic pressure is 17.4 mmHg. Left Atrium: Left atrial size was normal in size. Right Atrium: Right atrial size was normal in size. Pericardium: There is no evidence of pericardial effusion. Mitral Valve: The mitral valve is normal in structure. Mild mitral valve regurgitation. No evidence of mitral valve stenosis. Tricuspid Valve: The tricuspid valve is normal in structure. Tricuspid valve regurgitation is trivial. No evidence of tricuspid stenosis. Aortic Valve: The aortic valve is tricuspid. Aortic valve regurgitation is not visualized. No aortic stenosis is present. Pulmonic Valve: The pulmonic valve was grossly normal. Pulmonic valve regurgitation is not visualized. No evidence of pulmonic stenosis. Aorta: Aortic dilatation noted. There is borderline dilatation of the aortic root, measuring 39 mm. Venous: The inferior vena cava is normal in size with greater than 50% respiratory variability, suggesting right atrial pressure of 3 mmHg. IAS/Shunts: No atrial level shunt  detected by color flow Doppler.  LEFT VENTRICLE PLAX 2D LVIDd:         4.70 cm      Diastology LVIDs:         3.70 cm      LV e' medial:    7.38 cm/s LV PW:         0.70 cm      LV E/e' medial:  7.2 LV IVS:        0.80 cm      LV e'  lateral:   7.35 cm/s LVOT diam:     2.10 cm      LV E/e' lateral: 7.3 LV SV:         57 LV SV Index:   29 LVOT Area:     3.46 cm  LV Volumes (MOD) LV vol d, MOD A2C: 118.0 ml LV vol d, MOD A4C: 109.0 ml LV vol s, MOD A2C: 60.7 ml LV vol s, MOD A4C: 43.6 ml LV SV MOD A2C:     57.3 ml LV SV MOD A4C:     109.0 ml LV SV MOD BP:      64.1 ml RIGHT VENTRICLE RV S prime:     7.90 cm/s TAPSE (M-mode): 1.9 cm LEFT ATRIUM             Index       RIGHT ATRIUM           Index LA diam:        2.50 cm 1.30 cm/m  RA Area:     17.30 cm LA Vol (A2C):   38.0 ml 19.71 ml/m RA Volume:   50.40 ml  26.14 ml/m LA Vol (A4C):   39.2 ml 20.33 ml/m LA Biplane Vol: 41.5 ml 21.52 ml/m  AORTIC VALVE LVOT Vmax:   74.10 cm/s LVOT Vmean:  46.200 cm/s LVOT VTI:    0.164 m  AORTA Ao Root diam: 3.90 cm Ao Asc diam:  3.40 cm MITRAL VALVE               TRICUSPID VALVE MV Area (PHT): 2.91 cm    TR Peak grad:   8.3 mmHg MV Decel Time: 261 msec    TR Vmax:        144.00 cm/s MV E velocity: 53.30 cm/s MV A velocity: 58.80 cm/s  SHUNTS MV E/A ratio:  0.91        Systemic VTI:  0.16 m                            Systemic Diam: 2.10 cm Kirk Ruths MD Electronically signed by Kirk Ruths MD Signature Date/Time: 09/22/2020/2:10:50 PM    Final    IR EXCHANGE BILIARY DRAIN  Result Date: 09/24/2020 INDICATION: History of gastric cancer complicated by development of obstructive jaundice post internal/external biliary drainage catheter placement on 09/10/2020. Unfortunately, the patient returned to the hospital with sepsis with subsequent CT scan and right upper quadrant abdominal ultrasound demonstrated nodular wall thickening of the gallbladder with trace amount of pericholecystic fluid. Patient presents today for definitive cholangiogram via the biliary drainage catheter (to evaluate for patency of the cystic duct) as well as biliary drainage catheter exchange potential up sizing. EXAM: 1. FLUOROSCOPIC GUIDED BILIARY DRAINAGE CATHETER EXCHANGE AND  UP SIZING 2. PERCUTANEOUS CHOLANGIOGRAM VIA EXISTING BILIARY DRAINAGE CATHETER. 3. ULTRASOUND AND FLUOROSCOPIC-GUIDED CHOLECYSTOSTOMY TUBE PLACEMENT COMPARISON:  Percutaneous biliary drainage catheter placement-09/10/2020 CT the chest, abdomen pelvis-09/21/2020;  right upper quadrant abdominal ultrasound-09/21/2020 CT abdomen and pelvis - 09/10/2020 MEDICATIONS: The patient is currently admitted to the hospital and on intravenous antibiotics. Antibiotics were administered within an appropriate time frame prior to skin puncture. ANESTHESIA/SEDATION: Moderate (conscious) sedation was employed during this procedure. A total of Versed 2 mg and Fentanyl 100 mcg was administered intravenously. Moderate Sedation Time: 23 minutes. The patient's level of consciousness and vital signs were monitored continuously by radiology nursing throughout the procedure under my direct supervision. CONTRAST:  39mL OMNIPAQUE IOHEXOL 300 MG/ML SOLN - administered into the biliary tree and gallbladder lumen. FLUOROSCOPY TIME:  3 minutes, 42 seconds (417 mGy) COMPLICATIONS: None immediate. PROCEDURE: Informed written consent was obtained from the patient after a discussion of the risks, benefits and alternatives to treatment. Questions regarding the procedure were encouraged and answered. A timeout was performed prior to the initiation of the procedure. Sonographic evaluation was performed of the right quadrant demonstrating persistent nodular mural thickening of the gallbladder with minimal amount pericholecystic fluid. The right upper abdominal quadrant as well as the external portion of the existing biliary drainage catheter and surrounding skin were prepped and draped in the usual sterile fashion, and a sterile drape was applied covering the operative field. Maximum barrier sterile technique with sterile gowns and gloves were used for the procedure. A timeout was performed prior to the initiation of the procedure. Local anesthesia was  provided with 1% lidocaine with epinephrine. Cholangiogram was performed via the existing percutaneous biliary drainage catheter demonstrating appropriate position functionality of the drain with passage of contrast to the level of the duodenum. At this time, the external portion of the biliary drainage catheter was cut and cannulated with an Amplatz wire which was advanced through the entirety of the catheter in coiled within the duodenum. Under intermittent fluoroscopic guidance, the drainage catheter was exchanged for a 9 French, 25 cm radiopaque tip vascular sheath and a definitive pullback sheath cholangiogram was performed however failed to definitively opacify the cystic duct suggesting cystic duct occlusion. Over the Amplatz wire, the vascular sheath was exchanged for a new, slightly larger now 12 [place] biliary drainage catheter with end coiled and locked within the duodenum. Attention was now paid towards placement cholecystostomy tube. Utilizing a transhepatic approach, a 22 gauge needle was advanced into the gallbladder under direct ultrasound guidance. An ultrasound image was saved for documentation purposes. Appropriate intraluminal puncture was confirmed with the efflux of bile and advancement of an 0.018 wire into the gallbladder lumen. The needle was exchanged for an [place] set. A small amount of contrast was injected to confirm appropriate intraluminal positioning. Over a Benson wire, a 35.2-French [place] cholecystomy tube was advanced into the gallbladder fossa, coiled and locked. Bile was aspirated and a small amount of contrast was injected as several post procedural spot radiographic images were obtained in various obliquities. The catheter was secured to the skin with suture, connected to a drainage bag and a dressing was placed. The patient tolerated the procedure well without immediate post procedural complication. FINDINGS: Preprocedural spot fluoroscopic image demonstrates unchanged  positioning of the percutaneous drainage catheter with radiopaque side marker overlying the central aspect of the left biliary hilum and coil overlying the at the location of the duodenum. Contrast injection demonstrates appropriate position functionality of the biliary drainage catheter. Despite definitive/formal sheath cholangiogram, the cystic duct was not opacified suggestive of cystic duct occlusion and cholecystitis. After fluoroscopic guided exchange and up sizing the new now 17 [place] biliary drainage catheter is appropriately  positioned with end coiled and locked within the duodenum. Under direct ultrasound fluoroscopic guidance, a 10 French cholecystostomy tube is appropriately positioned with end coiled within the gallbladder lumen. IMPRESSION: 1. Successful fluoroscopic guided exchange and up sizing of now 12 French percutaneous biliary drainage catheter. 2. Despite formal sheath cholangiogram, the cystic duct was not opacified which when correlated with patient's admission of sepsis and abnormal appearance of the gallbladder on CT and ultrasound is worrisome for concomitant cholecystitis. 3. Successful ultrasound and fluoroscopic guided placement of a 10.2 French cholecystostomy tube. PLAN: - Once patient's bilirubin has hit a nadir, the patient may return for repeat cholangiogram and potential initiation of a capping trial of the percutaneous biliary drainage catheter. Electronically Signed   By: Sandi Mariscal M.D.   On: 09/24/2020 13:54   US ABDOMEN LIMITED RUQ (LIVER/GB)  Result Date: 09/21/2020 CLINICAL DATA:  Fever EXAM: ULTRASOUND ABDOMEN LIMITED RIGHT UPPER QUADRANT COMPARISON:  None. FINDINGS: Gallbladder: No gallstones. Gallbladder wall edema. Minor pericholecystic fluid. No sonographic Murphy sign noted by sonographer. Common bile duct: Diameter: Not well visualized, measuring 6 mm. Echogenic material may reflect sludge. A percutaneous biliary drain is noted on the prior CT. Liver: No focal  lesion identified. Intrahepatic duct dilatation. Within normal limits in parenchymal echogenicity. Portal vein is patent on color Doppler imaging with normal direction of blood flow towards the liver. Other: None. IMPRESSION: Nonspecific gallbladder wall edema. No sonographic evidence of acute cholecystitis. Intrahepatic duct dilatation. Common bile duct containing echogenic material that may reflect sludge. Electronically Signed   By: Macy Mis M.D.   On: 09/21/2020 20:35    Labs:  CBC: Recent Labs    09/22/20 0414 09/23/20 0506 09/24/20 0620 09/25/20 0500  WBC 10.0 6.2 6.1 5.7  HGB 9.2* 8.8* 8.7* 8.7*  HCT 26.9* 26.1* 26.3* 26.0*  PLT 105* 93* 133* 166    COAGS: Recent Labs    01/06/20 0915 09/09/20 1900 09/21/20 1126 09/22/20 0414 09/24/20 0620  INR 1.0 1.0 1.6* 1.8* 1.5*  APTT 29  --  35  --   --     BMP: Recent Labs    10/30/19 0900 11/20/19 0900 12/11/19 1000 12/31/19 1056 02/18/20 1025 09/22/20 0414 09/23/20 0506 09/24/20 0620 09/25/20 0500  NA 141 139 141 140   < > 132* 134* 131* 134*  K 3.5 4.0 4.0 4.0   < > 3.6 4.0 3.7 3.8  CL 108 104 107 106   < > 108 109 105 108  CO2 27 30 29 28    < > 21* 20* 20* 22  GLUCOSE 173* 103* 105* 127*   < > 151* 103* 93 95  BUN 12 18 13 16    < > 17 16 12 10   CALCIUM 9.2 9.8 9.4 9.4   < > 7.2* 7.4* 7.5* 7.6*  CREATININE 0.76 0.90 0.83 0.87   < > 0.81 0.67 0.64 0.67  GFRNONAA >60 >60 >60 >60   < > >60 >60 >60 >60  GFRAA >60 >60 >60 >60  --   --   --   --   --    < > = values in this interval not displayed.    LIVER FUNCTION TESTS: Recent Labs    09/22/20 0414 09/23/20 0506 09/24/20 0620 09/25/20 0500  BILITOT 3.3* 2.4* 2.4* 2.2*  AST 103* 73* 60* 41  ALT 74* 61* 55* 44  ALKPHOS 165* 182* 246* 237*  PROT 5.1* 4.9* 5.2* 4.9*  ALBUMIN 2.1* 2.0* 2.0* 1.9*  Assessment and Plan:  80 y.o. male with history of stage III gastric cancer s/p distal gastrostomy in 03/2019, obstructive jaundice status post failed  attempt at ERCP biliary stent placement on 09/09/20, s/p percutaneous biliary drainage placement, with IR on 6/9; s/p exchange/upsize bili drain and perc choley placement with Dr. Pascal Lux on 6/23    Pt stable, drain intact, flushes and aspirates well. Puncture site unremarkable, no s/s of bleeding or infection.  OP RUQ 12 Fr biliary drain 825 cc; RUQ 10.2 Fr Perc choley drain 450 cc   VSS WBC 5.7 today (6.1 yesterday) LFT improving; T bili trending down, 2.2 (2.4 yesterday)  Bile Cx ENTEROBACTER AEROGENES   Continue with flushing TID, output recording q shift and dressing changes as needed. Would consider additional imaging when output is less than 10 ml for 24 hours not including flush material.    Further treatment plan per [facility]/Oncology Appreciate and agree with the plan.  IR to follow.    Electronically Signed: Tera Mater, PA-C 09/25/2020, 9:52 AM   I spent a total of 25 Minutes at the the patient's bedside AND on the patient's hospital floor or unit, greater than 50% of which was counseling/coordinating care for bili drain and per choley drain

## 2020-09-25 NOTE — Progress Notes (Signed)
Jonathan Lewis continues to improve.  He is on IV antibiotics for the Enterobacter and Klebsiella in the blood.  He has Enterobacter in the biliary fluid.  Radiology did a stent exchange.  They really done a great job.  The bilirubin is coming down nicely.  His bilirubin is now 2.2.  I think the main problem right now is his lack of appetite.  We really need to get him to eat more.  I will try him on some Ensure.  His white cell count is 5.7.  Hemoglobin 8.7.  Platelet count 166,000.  His albumin is 1.9.  He has had no issues with fever.  He does not complain of any pain.  Para he is getting radiation therapy.  He is doing well with this so far.  I am unsure if he is really out of bed all that much.  Hopefully his activity level will increase.  His vital signs show temperature 98.3.  Pulse 67.  Blood pressure 154/78.  His lungs are clear bilaterally.  Sclera do not really show much icterus.  He has a regular rate and rhythm with his cardiac exam.  There are no murmurs.  Abdomen is soft.  He has the drainage tubes intact.  There is seems to be some bloodiness in one of the drainage bags.  There is no fluid wave in the abdomen.  Bowel sounds are decreased.  Extremity shows no clubbing, cyanosis or edema.  Neurological exam is nonfocal.  Jonathan Lewis has the metastatic gastric cancer.  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  He developed bacteremia from his biliary tract.  He is on antibiotics for this.  He is responding.  I just hope that we get his nutritional state little bit better.  The albumin of 1.9 is certainly troublesome for me.  I do appreciate the wonderful care he is getting from all the staff of [facility].  I do appreciate all of their hard work.  Lattie Haw, MD  Darlyn Chamber 26:14

## 2020-09-25 NOTE — Progress Notes (Signed)
PROGRESS NOTE    Jonathan Lewis  WUX:[id number] DOB: 1941/01/18 DOA: 09/21/2020 PCP: Biagio Borg, MD    Brief Narrative:  This 80 years old male with PMH significant for metastatic adenocarcinoma of the stomach, s/p Billroth II, DM, HTN who presented in the ED with generalized weakness, fatigue, less interactive,  not getting out of bed.  Patient is found to have biliary obstruction secondary to metastatic cancer.  Biliary stent was attempted  but failed so IR percutaneous drain was placed.  Of note patient presented 2 days ago for urinary catheter exchange , found to be febrile and was prescribed empirical Keflex for UTI.  Urine cultures no growth. Patient is admitted for severe sepsis / septic shock requiring Levophed secondary to Klebsiella bacteremia. Source is still considered biliary.  Infectious disease consulted,  started on IV antibiotics.  Patient underwent percutaneous drain which is draining well.  Liver enzymes trending down.  Assessment & Plan:   Principal Problem:   SIRS (systemic inflammatory response syndrome) (HCC) Active Problems:   Diabetes (HCC)   Benign nodular prostatic hyperplasia   Gastric cancer pT3pN2 s/p distal gastrectomy with Billroth II gastrojejunostomy 03/19/2019   Sepsis ([place])   Severe sepsis / Septic shock secondary to Klebsiella bacteremia: Patient presented with hypotension, lactic acid 3.5 and altered mentation. He briefly required Levophed which was discontinued on 6/21. Blood cultures positive for Klebsiella aerogenes bacteremia. Infectious disease consulted , thinks likely source is biliary. UA is clean, no urinary symptoms. Port-A-Cath is in place, no redness, no swelling , or pain. De-escalate antibiotics to cefepime.   Sepsis physiology improved.  Lactic acid normalized. ID recommended continue cefepime while inpatient, can be de-escalated to Bactrim at discharge. Total duration of antibiotic 14 days.  Acute metabolic encephalopathy :  Resolved. This could be multifactorial related to hypotension and sepsis CT head unremarkable.  Encephalopathy resolved. Continue IV hydration.  Biliary obstruction secondary to metastatic stomach cancer: S/p percutaneous drain by IR.  Draining well.   Patient is status post cholecystostomy tube placement and upsizing of biliary drain on 6/23 IR is planning to cholangiogram and drain capping trial. Bilirubin and LFTs are trending down.  Urinary obstruction: Continue Foley catheter.   Hyponatremia:  Could be likely secondary to dehydration given decreased p.o. intake. Sodium is improving, continue to trend.   Normocytic normochromic anemia: Baseline hemoglobin 10-11. Continue to monitor.  No obvious bleeding. Hb remains above 8.5   Adenocarcinoma of the stomach: Oncology is following.   Type 2 diabetes: Continue regular insulin sliding scale.     DVT prophylaxis: SCDs Code Status: DNR Family Communication: No family at bed side. Disposition Plan:   Status is: Inpatient  Remains inpatient appropriate because:Inpatient level of care appropriate due to severity of illness  Dispo: The patient is from: Home              Anticipated d/c is to: SNF              Patient currently is not medically stable to d/c.   Difficult to place patient No  Consultants:  GI IR  Procedures: Percutaneous biliary drain. Antimicrobials:   Anti-infectives (From admission, onward)    Start     Dose/Rate Route Frequency Ordered Stop   09/24/20 1400  fluconazole (DIFLUCAN) tablet 400 mg        400 mg Oral Daily 09/23/20 1655     09/23/20 1830  fluconazole (DIFLUCAN) IVPB 400 mg        400 mg  100 mL/hr over 120 Minutes Intravenous Every 2 hours 09/23/20 1821 09/23/20 2322   09/23/20 1800  fluconazole (DIFLUCAN) IVPB 800 mg  Status:  Discontinued        800 mg 200 mL/hr over 120 Minutes Intravenous  Once 09/23/20 1655 09/23/20 1821   09/22/20 1300  vancomycin (VANCOREADY) IVPB 1500  mg/300 mL  Status:  Discontinued        1,500 mg 150 mL/hr over 120 Minutes Intravenous Every 24 hours 09/21/20 1606 09/22/20 0858   09/21/20 2100  ceFEPIme (MAXIPIME) 2 g in sodium chloride 0.9 % 100 mL IVPB        2 g 200 mL/hr over 30 Minutes Intravenous Every 8 hours 09/21/20 1606     09/21/20 2000  metroNIDAZOLE (FLAGYL) IVPB 500 mg  Status:  Discontinued        500 mg 100 mL/hr over 60 Minutes Intravenous Every 8 hours 09/21/20 1553 09/22/20 0858   09/21/20 1300  vancomycin (VANCOREADY) IVPB 1500 mg/300 mL        1,500 mg 150 mL/hr over 120 Minutes Intravenous  Once 09/21/20 1155 09/21/20 1514   09/21/20 1200  ceFEPIme (MAXIPIME) 2 g in sodium chloride 0.9 % 100 mL IVPB        2 g 200 mL/hr over 30 Minutes Intravenous  Once 09/21/20 1155 09/21/20 1315   09/21/20 1145  metroNIDAZOLE (FLAGYL) IVPB 500 mg        500 mg 100 mL/hr over 60 Minutes Intravenous  Once 09/21/20 1133 09/21/20 1315        Subjective: Patient was seen and examined at bedside.  Overnight events noted.   He denies any abdominal pain, He has a biliary drain with good biliary output.   He underwent cholecystostomy tube and upsizing of the biliary drain , tolerated well yesterday.  Objective: Vitals:   09/24/20 1316 09/24/20 2018 09/25/20 0500 09/25/20 1345  BP: 140/81 112/83 (!) 154/78 132/84  Pulse: 64 78 67 65  Resp: 17 18 18 16   Temp: 98.5 F (36.9 C) 98.5 F (36.9 C) 98.3 F (36.8 C) (!) 97.5 F (36.4 C)  TempSrc: Oral  Oral   SpO2:  100% 98% 100%  Weight:      Height:        Intake/Output Summary (Last 24 hours) at 09/25/2020 1451 Last data filed at 09/25/2020 1438 Gross per 24 hour  Intake 3121.61 ml  Output 2685 ml  Net 436.61 ml    Filed Weights   09/24/20 0725  Weight: 95 kg    Examination:  General exam: Appears calm and comfortable, not in any acute distress. Respiratory system: Clear to auscultation. Respiratory effort normal. Cardiovascular system: S1 & S2 heard, RRR. No  JVD, murmurs, rubs, gallops or clicks. No pedal edema. Gastrointestinal system: Abdomen is nondistended, soft and nontender. No organomegaly or masses felt. Normal bowel sounds heard.  Biliary drain noted.  Central nervous system: Alert and oriented. No focal neurological deficits. Extremities: Symmetric 5 x 5 power.  No edema, no cyanosis, no clubbing. Skin: No rashes, lesions or ulcers Psychiatry: Judgement and insight appear normal. Mood & affect appropriate.     Data Reviewed: I have personally reviewed following labs and imaging studies  CBC: Recent Labs  Lab 09/21/20 1126 09/22/20 0414 09/23/20 0506 09/24/20 0620 09/25/20 0500  WBC 12.7* 10.0 6.2 6.1 5.7  NEUTROABS 10.2*  --   --   --   --   HGB 9.7* 9.2* 8.8* 8.7* 8.7*  HCT 28.4*  26.9* 26.1* 26.3* 26.0*  MCV 85.0 84.9 85.0 84.0 85.8  PLT 155 105* 93* 133* 027    Basic Metabolic Panel: Recent Labs  Lab 09/21/20 2020 09/22/20 0414 09/23/20 0506 09/24/20 0620 09/25/20 0500  NA 130* 132* 134* 131* 134*  K 3.4* 3.6 4.0 3.7 3.8  CL 105 108 109 105 108  CO2 20* 21* 20* 20* 22  GLUCOSE 130* 151* 103* 93 95  BUN 18 17 16 12 10   CREATININE 0.96 0.81 0.67 0.64 0.67  CALCIUM 7.3* 7.2* 7.4* 7.5* 7.6*    GFR: Estimated Creatinine Clearance: 80.9 mL/min (by C-G formula based on SCr of 0.67 mg/dL). Liver Function Tests: Recent Labs  Lab 09/21/20 1126 09/22/20 0414 09/23/20 0506 09/24/20 0620 09/25/20 0500  AST 147* 103* 73* 60* 41  ALT 95* 74* 61* 55* 44  ALKPHOS 189* 165* 182* 246* 237*  BILITOT 3.6* 3.3* 2.4* 2.4* 2.2*  PROT 6.2* 5.1* 4.9* 5.2* 4.9*  ALBUMIN 2.6* 2.1* 2.0* 2.0* 1.9*    No results for input(s): LIPASE, AMYLASE in the last 168 hours. No results for input(s): AMMONIA in the last 168 hours. Coagulation Profile: Recent Labs  Lab 09/21/20 1126 09/22/20 0414 09/24/20 0620  INR 1.6* 1.8* 1.5*    Cardiac Enzymes: No results for input(s): CKTOTAL, CKMB, CKMBINDEX, TROPONINI in the last 168  hours. BNP (last 3 results) No results for input(s): PROBNP in the last 8760 hours. HbA1C: No results for input(s): HGBA1C in the last 72 hours. CBG: Recent Labs  Lab 09/22/20 0738 09/23/20 0742 09/24/20 0722 09/24/20 1708 09/25/20 0728  GLUCAP 97 85 72 76 100*    Lipid Profile: No results for input(s): CHOL, HDL, LDLCALC, TRIG, CHOLHDL, LDLDIRECT in the last 72 hours. Thyroid Function Tests: No results for input(s): TSH, T4TOTAL, FREET4, T3FREE, THYROIDAB in the last 72 hours. Anemia Panel: No results for input(s): VITAMINB12, FOLATE, FERRITIN, TIBC, IRON, RETICCTPCT in the last 72 hours. Sepsis Labs: Recent Labs  Lab 09/21/20 1126 09/21/20 1323 09/21/20 2023  LATICACIDVEN 2.6* 3.6* 1.2     Recent Results (from the past 240 hour(s))  Urine culture     Status: None   Collection Time: 09/19/20  6:10 PM   Specimen: Urine, Clean Catch  Result Value Ref Range Status   Specimen Description   Final    URINE, CLEAN CATCH Performed at [facility], [place] [address] [place], [place] [address]    Special Requests   Final    URINE, CLEAN CATCH Performed at [facility], [place] [address], [place], [place] [address]    Culture   Final    NO GROWTH Performed at [facility], [place] [address], [place], [place] [address]    Report Status 09/21/2020 FINAL  Final  Culture, blood (Routine x 2)     Status: Abnormal   Collection Time: 09/21/20 11:26 AM   Specimen: BLOOD  Result Value Ref Range Status   Specimen Description   Final    BLOOD BLOOD LEFT FOREARM Performed at [place] [address], [place], [place] [address]    Special Requests   Final    BOTTLES DRAWN AEROBIC AND ANAEROBIC Blood Culture results may not be optimal due to an inadequate volume of blood received in culture bottles Performed at [place] [address], [place], [place] [address]    Culture  Setup Time   Final     GRAM NEGATIVE RODS IN BOTH AEROBIC AND  ANAEROBIC BOTTLES CRITICAL RESULT CALLED TO, READ BACK BY AND VERIFIED WITH: PHARMD Joyice Faster [phone] FCP Performed at [facility], [place] [address], [place], [place] [address]    Culture ENTEROBACTER AEROGENES (A)  Final   Report Status 09/24/2020 FINAL  Final   Organism ID, Bacteria ENTEROBACTER AEROGENES  Final      Susceptibility   Enterobacter aerogenes - MIC*    CEFAZOLIN RESISTANT Resistant     CEFEPIME <=0.12 SENSITIVE Sensitive     CEFTAZIDIME <=1 SENSITIVE Sensitive     CEFTRIAXONE <=0.25 SENSITIVE Sensitive     CIPROFLOXACIN <=0.25 SENSITIVE Sensitive     GENTAMICIN <=1 SENSITIVE Sensitive     IMIPENEM <=0.25 SENSITIVE Sensitive     TRIMETH/SULFA <=20 SENSITIVE Sensitive     PIP/TAZO <=4 SENSITIVE Sensitive     * ENTEROBACTER AEROGENES  Blood Culture ID Panel (Reflexed)     Status: Abnormal   Collection Time: 09/21/20 11:26 AM  Result Value Ref Range Status   Enterococcus faecalis NOT DETECTED NOT DETECTED Final   Enterococcus Faecium NOT DETECTED NOT DETECTED Final   Listeria monocytogenes NOT DETECTED NOT DETECTED Final   Staphylococcus species NOT DETECTED NOT DETECTED Final   Staphylococcus aureus (BCID) NOT DETECTED NOT DETECTED Final   Staphylococcus epidermidis NOT DETECTED NOT DETECTED Final   Staphylococcus lugdunensis NOT DETECTED NOT DETECTED Final   Streptococcus species NOT DETECTED NOT DETECTED Final   Streptococcus agalactiae NOT DETECTED NOT DETECTED Final   Streptococcus pneumoniae NOT DETECTED NOT DETECTED Final   Streptococcus pyogenes NOT DETECTED NOT DETECTED Final   A.calcoaceticus-baumannii NOT DETECTED NOT DETECTED Final   Bacteroides fragilis NOT DETECTED NOT DETECTED Final   Enterobacterales DETECTED (A) NOT DETECTED Final    Comment: Enterobacterales represent a large order of gram negative bacteria, not a single organism. CRITICAL RESULT CALLED TO, READ BACK BY AND VERIFIED WITH: PHARMD NICK G.  [phone] FCP    Enterobacter cloacae complex NOT DETECTED NOT DETECTED Final   Escherichia coli NOT DETECTED NOT DETECTED Final   Klebsiella aerogenes DETECTED (A) NOT DETECTED Final    Comment: CRITICAL RESULT CALLED TO, READ BACK BY AND VERIFIED WITH: PHARMD NICK G. [phone] FCP    Klebsiella oxytoca NOT DETECTED NOT DETECTED Final   Klebsiella pneumoniae NOT DETECTED NOT DETECTED Final   Proteus species NOT DETECTED NOT DETECTED Final   Salmonella species NOT DETECTED NOT DETECTED Final   Serratia marcescens NOT DETECTED NOT DETECTED Final   Haemophilus influenzae NOT DETECTED NOT DETECTED Final   Neisseria meningitidis NOT DETECTED NOT DETECTED Final   Pseudomonas aeruginosa NOT DETECTED NOT DETECTED Final   Stenotrophomonas maltophilia NOT DETECTED NOT DETECTED Final   Candida albicans NOT DETECTED NOT DETECTED Final   Candida auris NOT DETECTED NOT DETECTED Final   Candida glabrata NOT DETECTED NOT DETECTED Final   Candida krusei NOT DETECTED NOT DETECTED Final   Candida parapsilosis NOT DETECTED NOT DETECTED Final   Candida tropicalis NOT DETECTED NOT DETECTED Final   Cryptococcus neoformans/gattii NOT DETECTED NOT DETECTED Final   CTX-M ESBL NOT DETECTED NOT DETECTED Final   Carbapenem resistance IMP NOT DETECTED NOT DETECTED Final   Carbapenem resistance KPC NOT DETECTED NOT DETECTED Final   Carbapenem resistance NDM NOT DETECTED NOT DETECTED Final   Carbapenem resist OXA 48 LIKE NOT DETECTED NOT DETECTED Final   Carbapenem resistance VIM NOT DETECTED NOT DETECTED Final    Comment: Performed at [facility], [address],  [place] [address]  Resp Panel by RT-PCR (Flu A&B, Covid) Nasopharyngeal Swab     Status: None   Collection Time: 09/21/20 11:33 AM   Specimen: Nasopharyngeal Swab; Nasopharyngeal(NP) swabs in vial transport medium  Result Value Ref Range Status   SARS Coronavirus 2 by RT PCR NEGATIVE NEGATIVE Final    Comment: (NOTE) SARS-CoV-2  target nucleic acids are NOT DETECTED.  The SARS-CoV-2 RNA is generally detectable in upper respiratory specimens during the acute phase of infection. The lowest concentration of SARS-CoV-2 viral copies this assay can detect is 138 copies/mL. A negative result does not preclude SARS-Cov-2 infection and should not be used as the sole basis for treatment or other patient management decisions. A negative result may occur with  improper specimen collection/handling, submission of specimen other than nasopharyngeal swab, presence of viral mutation(s) within the areas targeted by this assay, and inadequate number of viral copies(<138 copies/mL). A negative result must be combined with clinical observations, patient history, and epidemiological information. The expected result is Negative.  Fact Sheet for Patients:  EntrepreneurPulse.com.au  Fact Sheet for Healthcare Providers:  IncredibleEmployment.be  This test is no t yet approved or cleared by the [place] FDA and  has been authorized for detection and/or diagnosis of SARS-CoV-2 by FDA under an Emergency Use Authorization (EUA). This EUA will remain  in effect (meaning this test can be used) for the duration of the COVID-19 declaration under Section 564(b)(1) of the Act, 21 U.S.C.section 360bbb-3(b)(1), unless the authorization is terminated  or revoked sooner.       Influenza A by PCR NEGATIVE NEGATIVE Final   Influenza B by PCR NEGATIVE NEGATIVE Final    Comment: (NOTE) The Xpert Xpress SARS-CoV-2/FLU/RSV plus assay is intended as an aid in the diagnosis of influenza from Nasopharyngeal swab specimens and should not be used as a sole basis for treatment. Nasal washings and aspirates are unacceptable for Xpert Xpress SARS-CoV-2/FLU/RSV testing.  Fact Sheet for Patients: EntrepreneurPulse.com.au  Fact Sheet for Healthcare  Providers: IncredibleEmployment.be  This test is not yet approved or cleared by the [place] FDA and has been authorized for detection and/or diagnosis of SARS-CoV-2 by FDA under an Emergency Use Authorization (EUA). This EUA will remain in effect (meaning this test can be used) for the duration of the COVID-19 declaration under Section 564(b)(1) of the Act, 21 U.S.C. section 360bbb-3(b)(1), unless the authorization is terminated or revoked.  Performed at [facility], [place] [address] [place], [place] [address]   Culture, blood (Routine x 2)     Status: Abnormal   Collection Time: 09/21/20 11:42 AM   Specimen: BLOOD  Result Value Ref Range Status   Specimen Description   Final    BLOOD PORTA CATH Performed at [place] [address], [place], [place] [address]    Special Requests   Final    BOTTLES DRAWN AEROBIC AND ANAEROBIC Blood Culture adequate volume Performed at [place] [address] [place], [place] [address]    Culture  Setup Time   Final    GRAM NEGATIVE RODS IN BOTH AEROBIC AND ANAEROBIC BOTTLES    Culture (A)  Final    ENTEROBACTER AEROGENES SUSCEPTIBILITIES PERFORMED ON PREVIOUS CULTURE WITHIN THE LAST 5 DAYS. Performed at [facility], [place] [address], [place],  [address]    Report Status 09/24/2020 FINAL  Final  Urine culture     Status: None   Collection Time: 09/21/20  2:45 PM  Specimen: In/Out Cath Urine  Result Value Ref Range Status   Specimen Description   Final    IN/OUT CATH URINE Performed at [place] [address], [place], [place] [address]    Special Requests   Final    NONE Performed at [facility], [place] [address] [place], [place] [address]    Culture   Final    NO GROWTH Performed at [facility], [place] [address], [place], [place] [address]    Report Status 09/23/2020 FINAL  Final   Body fluid culture w Gram Stain     Status: None   Collection Time: 09/21/20  3:15 PM   Specimen: BILE; Body Fluid  Result Value Ref Range Status   Specimen Description   Final    BILE Performed at [place] [address] [place], [place] [address]    Special Requests   Final    Immunocompromised Performed at [facility], [place] [address] [place], [place] [address]    Gram Stain   Final    NO WBC SEEN RARE GRAM NEGATIVE RODS Performed at [facility], [place] [address], [place], [place] [address]    Culture   Final    RARE ENTEROBACTER AEROGENES MODERATE CANDIDA ALBICANS    Report Status 09/25/2020 FINAL  Final   Organism ID, Bacteria ENTEROBACTER AEROGENES  Final      Susceptibility   Enterobacter aerogenes - MIC*    CEFAZOLIN RESISTANT Resistant     CEFEPIME <=0.12 SENSITIVE Sensitive     CEFTAZIDIME <=1 SENSITIVE Sensitive     CEFTRIAXONE <=0.25 SENSITIVE Sensitive     CIPROFLOXACIN <=0.25 SENSITIVE Sensitive     GENTAMICIN <=1 SENSITIVE Sensitive     IMIPENEM <=0.25 SENSITIVE Sensitive     TRIMETH/SULFA <=20 SENSITIVE Sensitive     PIP/TAZO <=4 SENSITIVE Sensitive     * RARE ENTEROBACTER AEROGENES  MRSA PCR Screening     Status: None   Collection Time: 09/21/20  4:44 PM  Result Value Ref Range Status   MRSA by PCR NEGATIVE NEGATIVE Final    Comment:        The GeneXpert MRSA Assay (FDA approved for NASAL specimens only), is one component of a comprehensive MRSA colonization surveillance program. It is not intended to diagnose MRSA infection nor to guide or monitor treatment for MRSA infections. Performed at [facility], [place] [address], [place], [place] [address]      Radiology Studies: IR Perc Cholecystostomy  Result Date: 09/24/2020 INDICATION: History of gastric cancer complicated by development of obstructive jaundice post internal/external biliary drainage catheter placement on  09/10/2020. Unfortunately, the patient returned to the hospital with sepsis with subsequent CT scan and right upper quadrant abdominal ultrasound demonstrated nodular wall thickening of the gallbladder with trace amount of pericholecystic fluid. Patient presents today for definitive cholangiogram via the biliary drainage catheter (to evaluate for patency of the cystic duct) as well as biliary drainage catheter exchange potential up sizing. EXAM: 1. FLUOROSCOPIC GUIDED BILIARY DRAINAGE CATHETER EXCHANGE AND UP SIZING 2. PERCUTANEOUS CHOLANGIOGRAM VIA EXISTING BILIARY DRAINAGE CATHETER. 3. ULTRASOUND AND FLUOROSCOPIC-GUIDED CHOLECYSTOSTOMY TUBE PLACEMENT COMPARISON:  Percutaneous biliary drainage catheter placement-09/10/2020 CT the chest, abdomen pelvis-09/21/2020; right upper quadrant abdominal ultrasound-09/21/2020 CT abdomen and pelvis - 09/10/2020 MEDICATIONS: The patient is currently admitted to the hospital and on intravenous antibiotics. Antibiotics were administered within an appropriate time frame prior to skin puncture. ANESTHESIA/SEDATION: Moderate (conscious) sedation was  employed during this procedure. A total of Versed 2 mg and Fentanyl 100 mcg was administered intravenously. Moderate Sedation Time: 23 minutes. The patient's level of consciousness and vital signs were monitored continuously by radiology nursing throughout the procedure under my direct supervision. CONTRAST:  71mL OMNIPAQUE IOHEXOL 300 MG/ML SOLN - administered into the biliary tree and gallbladder lumen. FLUOROSCOPY TIME:  3 minutes, 42 seconds (376 mGy) COMPLICATIONS: None immediate. PROCEDURE: Informed written consent was obtained from the patient after a discussion of the risks, benefits and alternatives to treatment. Questions regarding the procedure were encouraged and answered. A timeout was performed prior to the initiation of the procedure. Sonographic evaluation was performed of the right quadrant demonstrating persistent  nodular mural thickening of the gallbladder with minimal amount pericholecystic fluid. The right upper abdominal quadrant as well as the external portion of the existing biliary drainage catheter and surrounding skin were prepped and draped in the usual sterile fashion, and a sterile drape was applied covering the operative field. Maximum barrier sterile technique with sterile gowns and gloves were used for the procedure. A timeout was performed prior to the initiation of the procedure. Local anesthesia was provided with 1% lidocaine with epinephrine. Cholangiogram was performed via the existing percutaneous biliary drainage catheter demonstrating appropriate position functionality of the drain with passage of contrast to the level of the duodenum. At this time, the external portion of the biliary drainage catheter was cut and cannulated with an Amplatz wire which was advanced through the entirety of the catheter in coiled within the duodenum. Under intermittent fluoroscopic guidance, the drainage catheter was exchanged for a 9 French, 25 cm radiopaque tip vascular sheath and a definitive pullback sheath cholangiogram was performed however failed to definitively opacify the cystic duct suggesting cystic duct occlusion. Over the Amplatz wire, the vascular sheath was exchanged for a new, slightly larger now 12 [place] biliary drainage catheter with end coiled and locked within the duodenum. Attention was now paid towards placement cholecystostomy tube. Utilizing a transhepatic approach, a 22 gauge needle was advanced into the gallbladder under direct ultrasound guidance. An ultrasound image was saved for documentation purposes. Appropriate intraluminal puncture was confirmed with the efflux of bile and advancement of an 0.018 wire into the gallbladder lumen. The needle was exchanged for an [place] set. A small amount of contrast was injected to confirm appropriate intraluminal positioning. Over a Benson wire, a  40.2-French Cook cholecystomy tube was advanced into the gallbladder fossa, coiled and locked. Bile was aspirated and a small amount of contrast was injected as several post procedural spot radiographic images were obtained in various obliquities. The catheter was secured to the skin with suture, connected to a drainage bag and a dressing was placed. The patient tolerated the procedure well without immediate post procedural complication. FINDINGS: Preprocedural spot fluoroscopic image demonstrates unchanged positioning of the percutaneous drainage catheter with radiopaque side marker overlying the central aspect of the left biliary hilum and coil overlying the at the location of the duodenum. Contrast injection demonstrates appropriate position functionality of the biliary drainage catheter. Despite definitive/formal sheath cholangiogram, the cystic duct was not opacified suggestive of cystic duct occlusion and cholecystitis. After fluoroscopic guided exchange and up sizing the new now 59 [place] biliary drainage catheter is appropriately positioned with end coiled and locked within the duodenum. Under direct ultrasound fluoroscopic guidance, a 10 French cholecystostomy tube is appropriately positioned with end coiled within the gallbladder lumen. IMPRESSION: 1. Successful fluoroscopic guided exchange and up sizing of now 12  French percutaneous biliary drainage catheter. 2. Despite formal sheath cholangiogram, the cystic duct was not opacified which when correlated with patient's admission of sepsis and abnormal appearance of the gallbladder on CT and ultrasound is worrisome for concomitant cholecystitis. 3. Successful ultrasound and fluoroscopic guided placement of a 10.2 French cholecystostomy tube. PLAN: - Once patient's bilirubin has hit a nadir, the patient may return for repeat cholangiogram and potential initiation of a capping trial of the percutaneous biliary drainage catheter. Electronically Signed   By:  Sandi Mariscal M.D.   On: 09/24/2020 13:54   IR EXCHANGE BILIARY DRAIN  Result Date: 09/24/2020 INDICATION: History of gastric cancer complicated by development of obstructive jaundice post internal/external biliary drainage catheter placement on 09/10/2020. Unfortunately, the patient returned to the hospital with sepsis with subsequent CT scan and right upper quadrant abdominal ultrasound demonstrated nodular wall thickening of the gallbladder with trace amount of pericholecystic fluid. Patient presents today for definitive cholangiogram via the biliary drainage catheter (to evaluate for patency of the cystic duct) as well as biliary drainage catheter exchange potential up sizing. EXAM: 1. FLUOROSCOPIC GUIDED BILIARY DRAINAGE CATHETER EXCHANGE AND UP SIZING 2. PERCUTANEOUS CHOLANGIOGRAM VIA EXISTING BILIARY DRAINAGE CATHETER. 3. ULTRASOUND AND FLUOROSCOPIC-GUIDED CHOLECYSTOSTOMY TUBE PLACEMENT COMPARISON:  Percutaneous biliary drainage catheter placement-09/10/2020 CT the chest, abdomen pelvis-09/21/2020; right upper quadrant abdominal ultrasound-09/21/2020 CT abdomen and pelvis - 09/10/2020 MEDICATIONS: The patient is currently admitted to the hospital and on intravenous antibiotics. Antibiotics were administered within an appropriate time frame prior to skin puncture. ANESTHESIA/SEDATION: Moderate (conscious) sedation was employed during this procedure. A total of Versed 2 mg and Fentanyl 100 mcg was administered intravenously. Moderate Sedation Time: 23 minutes. The patient's level of consciousness and vital signs were monitored continuously by radiology nursing throughout the procedure under my direct supervision. CONTRAST:  79mL OMNIPAQUE IOHEXOL 300 MG/ML SOLN - administered into the biliary tree and gallbladder lumen. FLUOROSCOPY TIME:  3 minutes, 42 seconds (254 mGy) COMPLICATIONS: None immediate. PROCEDURE: Informed written consent was obtained from the patient after a discussion of the risks, benefits and  alternatives to treatment. Questions regarding the procedure were encouraged and answered. A timeout was performed prior to the initiation of the procedure. Sonographic evaluation was performed of the right quadrant demonstrating persistent nodular mural thickening of the gallbladder with minimal amount pericholecystic fluid. The right upper abdominal quadrant as well as the external portion of the existing biliary drainage catheter and surrounding skin were prepped and draped in the usual sterile fashion, and a sterile drape was applied covering the operative field. Maximum barrier sterile technique with sterile gowns and gloves were used for the procedure. A timeout was performed prior to the initiation of the procedure. Local anesthesia was provided with 1% lidocaine with epinephrine. Cholangiogram was performed via the existing percutaneous biliary drainage catheter demonstrating appropriate position functionality of the drain with passage of contrast to the level of the duodenum. At this time, the external portion of the biliary drainage catheter was cut and cannulated with an Amplatz wire which was advanced through the entirety of the catheter in coiled within the duodenum. Under intermittent fluoroscopic guidance, the drainage catheter was exchanged for a 9 French, 25 cm radiopaque tip vascular sheath and a definitive pullback sheath cholangiogram was performed however failed to definitively opacify the cystic duct suggesting cystic duct occlusion. Over the Amplatz wire, the vascular sheath was exchanged for a new, slightly larger now 12 [place] biliary drainage catheter with end coiled and locked within the duodenum. Attention was  now paid towards placement cholecystostomy tube. Utilizing a transhepatic approach, a 22 gauge needle was advanced into the gallbladder under direct ultrasound guidance. An ultrasound image was saved for documentation purposes. Appropriate intraluminal puncture was confirmed with  the efflux of bile and advancement of an 0.018 wire into the gallbladder lumen. The needle was exchanged for an [place] set. A small amount of contrast was injected to confirm appropriate intraluminal positioning. Over a [place] wire, a 52.2-French [place] cholecystomy tube was advanced into the gallbladder fossa, coiled and locked. Bile was aspirated and a small amount of contrast was injected as several post procedural spot radiographic images were obtained in various obliquities. The catheter was secured to the skin with suture, connected to a drainage bag and a dressing was placed. The patient tolerated the procedure well without immediate post procedural complication. FINDINGS: Preprocedural spot fluoroscopic image demonstrates unchanged positioning of the percutaneous drainage catheter with radiopaque side marker overlying the central aspect of the left biliary hilum and coil overlying the at the location of the duodenum. Contrast injection demonstrates appropriate position functionality of the biliary drainage catheter. Despite definitive/formal sheath cholangiogram, the cystic duct was not opacified suggestive of cystic duct occlusion and cholecystitis. After fluoroscopic guided exchange and up sizing the new now 68 [place] biliary drainage catheter is appropriately positioned with end coiled and locked within the duodenum. Under direct ultrasound fluoroscopic guidance, a 10 French cholecystostomy tube is appropriately positioned with end coiled within the gallbladder lumen. IMPRESSION: 1. Successful fluoroscopic guided exchange and up sizing of now 12 French percutaneous biliary drainage catheter. 2. Despite formal sheath cholangiogram, the cystic duct was not opacified which when correlated with patient's admission of sepsis and abnormal appearance of the gallbladder on CT and ultrasound is worrisome for concomitant cholecystitis. 3. Successful ultrasound and fluoroscopic guided placement of a 10.2 French  cholecystostomy tube. PLAN: - Once patient's bilirubin has hit a nadir, the patient may return for repeat cholangiogram and potential initiation of a capping trial of the percutaneous biliary drainage catheter. Electronically Signed   By: Sandi Mariscal M.D.   On: 09/24/2020 13:54    Scheduled Meds:  chlorhexidine  15 mL Mouth Rinse BID   Chlorhexidine Gluconate Cloth  6 each Topical Daily   dorzolamide-timolol  1 drop Both Eyes BID   fluconazole  400 mg Oral Daily   latanoprost  1 drop Both Eyes QHS   mouth rinse  15 mL Mouth Rinse q12n4p   Ensure Max Protein  11 oz Oral TID BM   sodium chloride flush  10-40 mL Intracatheter Q12H   sodium chloride flush  5 mL Intracatheter Q8H   Continuous Infusions:  sodium chloride 125 mL/hr at 09/25/20 0606   ceFEPime (MAXIPIME) IV 2 g (09/25/20 0608)     LOS: 4 days    Time spent: 25 mins    Cande Mastropietro, MD [facility]   If 7PM-7AM, please contact night-coverage

## 2020-09-26 LAB — COMPREHENSIVE METABOLIC PANEL
ALT: 38 U/L (ref 0–44)
AST: 30 U/L (ref 15–41)
Albumin: 2 g/dL — ABNORMAL LOW (ref 3.5–5.0)
Alkaline Phosphatase: 247 U/L — ABNORMAL HIGH (ref 38–126)
Anion gap: 3 — ABNORMAL LOW (ref 5–15)
BUN: 9 mg/dL (ref 8–23)
CO2: 23 mmol/L (ref 22–32)
Calcium: 7.7 mg/dL — ABNORMAL LOW (ref 8.9–10.3)
Chloride: 108 mmol/L (ref 98–111)
Creatinine, Ser: 0.62 mg/dL (ref 0.61–1.24)
GFR, Estimated: >60 mL/min (ref >60)
Glucose, Bld: 125 mg/dL — ABNORMAL HIGH (ref 70–99)
Potassium: 3.8 mmol/L (ref 3.5–5.1)
Sodium: 134 mmol/L — ABNORMAL LOW (ref 135–145)
Total Bilirubin: 2 mg/dL — ABNORMAL HIGH (ref 0.3–1.2)
Total Protein: 5.2 g/dL — ABNORMAL LOW (ref 6.5–8.1)

## 2020-09-26 LAB — GLUCOSE, CAPILLARY: Glucose-Capillary: 122 mg/dL — ABNORMAL HIGH (ref 70–99)

## 2020-09-26 LAB — CBC
HCT: 26 % — ABNORMAL LOW (ref 39.0–52.0)
Hemoglobin: 8.7 g/dL — ABNORMAL LOW (ref 13.0–17.0)
MCH: 28.7 pg (ref 26.0–34.0)
MCHC: 33.5 g/dL (ref 30.0–36.0)
MCV: 85.8 fL (ref 80.0–100.0)
Platelets: 213 10*3/uL (ref 150–400)
RBC: 3.03 MIL/uL — ABNORMAL LOW (ref 4.22–5.81)
RDW: 14.1 % (ref 11.5–15.5)
WBC: 4.2 10*3/uL (ref 4.0–10.5)
nRBC: 0 % (ref 0.0–0.2)

## 2020-09-26 NOTE — Progress Notes (Addendum)
PROGRESS NOTE    Jonathan Lewis  OZD:[id number] DOB: 03-25-1941 DOA: 09/21/2020 PCP: Biagio Borg, MD    Brief Narrative:  This 80 years old male with PMH significant for metastatic adenocarcinoma of the stomach, s/p Billroth II, DM, HTN who presented in the ED with generalized weakness, fatigue, less interactive,  not getting out of bed.  Patient is found to have biliary obstruction secondary to metastatic cancer.  Biliary stent was attempted  but failed so IR percutaneous drain was placed.  Of note patient presented 2 days ago for urinary catheter exchange , found to be febrile and was prescribed empirical Keflex for UTI.  Urine cultures no growth. Patient was admitted for severe sepsis / septic shock requiring Levophed secondary to Klebsiella bacteremia. Source is still considered biliary.  Infectious disease consulted,  started on IV antibiotics.  Patient underwent percutaneous drain which is draining well.  Liver enzymes trending down. Levophed discontinued.  Assessment & Plan:   Principal Problem:   SIRS (systemic inflammatory response syndrome) (HCC) Active Problems:   Diabetes (HCC)   Benign nodular prostatic hyperplasia   Gastric cancer pT3pN2 s/p distal gastrectomy with Billroth II gastrojejunostomy 03/19/2019   Sepsis ([place])   Severe sepsis / Septic shock secondary to Klebsiella bacteremia: Patient presented with hypotension, lactic acid 3.5 and altered mentation. He briefly required Levophed which was discontinued on 6/21. Blood cultures positive for Klebsiella aerogenes bacteremia. Infectious disease consulted , thinks likely source is biliary. UA is clean, no urinary symptoms. Port-A-Cath is in place, no redness, no swelling , or pain. De-escalate antibiotics to cefepime.   Sepsis physiology improved.  Lactic acid normalized. ID recommended continue cefepime while inpatient, can be de-escalated to Bactrim at discharge. Total duration of antibiotic 14 days.  Acute  metabolic encephalopathy : Resolved. This could be multifactorial related to hypotension and sepsis CT head unremarkable.  Encephalopathy resolved. Continue IV hydration.  Biliary obstruction secondary to metastatic stomach cancer: S/p percutaneous drain by IR.  Draining well.   Patient is status post cholecystostomy tube placement and upsizing of biliary drain on 6/23 IR is planning to cholangiogram and drain capping trial. Bilirubin and LFTs are trending down. IR planning to repeat imaging once drain output is minimal, less than 7ml x 24 hr  Urinary obstruction: Continue Foley catheter.   Hyponatremia:  Could be likely secondary to dehydration given decreased p.o. intake. Sodium is improving, continue to trend.   Normocytic normochromic anemia: Baseline hemoglobin 10-11. Continue to monitor.  No obvious bleeding. Hb remains above 8.5   Adenocarcinoma of the stomach: Oncology is following.   Type 2 diabetes: Continue regular insulin sliding scale.     DVT prophylaxis: SCDs Code Status: DNR Family Communication: No family at bed side. Disposition Plan:   Status is: Inpatient  Remains inpatient appropriate because:Inpatient level of care appropriate due to severity of illness  Dispo: The patient is from: Home              Anticipated d/c is to: SNF              Patient currently is not medically stable to d/c.   Difficult to place patient No  Consultants:  GI IR  Procedures: Percutaneous biliary drain. Antimicrobials:   Anti-infectives (From admission, onward)    Start     Dose/Rate Route Frequency Ordered Stop   09/24/20 1400  fluconazole (DIFLUCAN) tablet 400 mg        400 mg Oral Daily 09/23/20 1655  09/23/20 1830  fluconazole (DIFLUCAN) IVPB 400 mg        400 mg 100 mL/hr over 120 Minutes Intravenous Every 2 hours 09/23/20 1821 09/23/20 2322   09/23/20 1800  fluconazole (DIFLUCAN) IVPB 800 mg  Status:  Discontinued        800 mg 200 mL/hr over 120  Minutes Intravenous  Once 09/23/20 1655 09/23/20 1821   09/22/20 1300  vancomycin (VANCOREADY) IVPB 1500 mg/300 mL  Status:  Discontinued        1,500 mg 150 mL/hr over 120 Minutes Intravenous Every 24 hours 09/21/20 1606 09/22/20 0858   09/21/20 2100  ceFEPIme (MAXIPIME) 2 g in sodium chloride 0.9 % 100 mL IVPB        2 g 200 mL/hr over 30 Minutes Intravenous Every 8 hours 09/21/20 1606     09/21/20 2000  metroNIDAZOLE (FLAGYL) IVPB 500 mg  Status:  Discontinued        500 mg 100 mL/hr over 60 Minutes Intravenous Every 8 hours 09/21/20 1553 09/22/20 0858   09/21/20 1300  vancomycin (VANCOREADY) IVPB 1500 mg/300 mL        1,500 mg 150 mL/hr over 120 Minutes Intravenous  Once 09/21/20 1155 09/21/20 1514   09/21/20 1200  ceFEPIme (MAXIPIME) 2 g in sodium chloride 0.9 % 100 mL IVPB        2 g 200 mL/hr over 30 Minutes Intravenous  Once 09/21/20 1155 09/21/20 1315   09/21/20 1145  metroNIDAZOLE (FLAGYL) IVPB 500 mg        500 mg 100 mL/hr over 60 Minutes Intravenous  Once 09/21/20 1133 09/21/20 1315        Subjective: Patient was seen and examined at bedside.  Overnight events noted.   He denies any abdominal pain, He has a biliary drain with brownish biliary output.   He underwent cholecystostomy tube and upsizing of the biliary drain , tolerated well 6/24. He reports feeling better, was having breakfast in the morning.  Objective: Vitals:   09/25/20 1345 09/25/20 2000 09/26/20 0534 09/26/20 1338  BP: 132/84 (!) 149/94 118/85 130/74  Pulse: 65 67 67 69  Resp: 16 18 18 18   Temp: (!) 97.5 F (36.4 C) 98.4 F (36.9 C) 98.1 F (36.7 C) 97.9 F (36.6 C)  TempSrc:  Oral Oral Oral  SpO2: 100% 100% 100% 100%  Weight:      Height:        Intake/Output Summary (Last 24 hours) at 09/26/2020 1514 Last data filed at 09/26/2020 1032 Gross per 24 hour  Intake 1457.38 ml  Output 2590 ml  Net -1132.62 ml   Filed Weights   09/24/20 0725  Weight: 95 kg    Examination:  General  exam: Appears calm and comfortable, not in any acute distress. Respiratory system: Clear to auscultation. Respiratory effort normal. Cardiovascular system: S1 & S2 heard, RRR. No JVD, murmurs, rubs, gallops or clicks. No pedal edema. Gastrointestinal system: Abdomen is nondistended, soft and nontender. No organomegaly or masses felt. Normal bowel sounds heard.   Biliary drain  x 2  noted.  Central nervous system: Alert and oriented. No focal neurological deficits. Extremities: Symmetric 5 x 5 power.  No edema, no cyanosis, no clubbing. Skin: No rashes, lesions or ulcers Psychiatry: Judgement and insight appear normal. Mood & affect appropriate.     Data Reviewed: I have personally reviewed following labs and imaging studies  CBC: Recent Labs  Lab 09/21/20 1126 09/22/20 0414 09/23/20 0506 09/24/20 0620 09/25/20 0500 09/26/20  0403  WBC 12.7* 10.0 6.2 6.1 5.7 4.2  NEUTROABS 10.2*  --   --   --   --   --   HGB 9.7* 9.2* 8.8* 8.7* 8.7* 8.7*  HCT 28.4* 26.9* 26.1* 26.3* 26.0* 26.0*  MCV 85.0 84.9 85.0 84.0 85.8 85.8  PLT 155 105* 93* 133* 166 177   Basic Metabolic Panel: Recent Labs  Lab 09/22/20 0414 09/23/20 0506 09/24/20 0620 09/25/20 0500 09/26/20 0403  NA 132* 134* 131* 134* 134*  K 3.6 4.0 3.7 3.8 3.8  CL 108 109 105 108 108  CO2 21* 20* 20* 22 23  GLUCOSE 151* 103* 93 95 125*  BUN 17 16 12 10 9   CREATININE 0.81 0.67 0.64 0.67 0.62  CALCIUM 7.2* 7.4* 7.5* 7.6* 7.7*   GFR: Estimated Creatinine Clearance: 80.9 mL/min (by C-G formula based on SCr of 0.62 mg/dL). Liver Function Tests: Recent Labs  Lab 09/22/20 0414 09/23/20 0506 09/24/20 0620 09/25/20 0500 09/26/20 0403  AST 103* 73* 60* 41 30  ALT 74* 61* 55* 44 38  ALKPHOS 165* 182* 246* 237* 247*  BILITOT 3.3* 2.4* 2.4* 2.2* 2.0*  PROT 5.1* 4.9* 5.2* 4.9* 5.2*  ALBUMIN 2.1* 2.0* 2.0* 1.9* 2.0*   No results for input(s): LIPASE, AMYLASE in the last 168 hours. No results for input(s): AMMONIA in the last  168 hours. Coagulation Profile: Recent Labs  Lab 09/21/20 1126 09/22/20 0414 09/24/20 0620  INR 1.6* 1.8* 1.5*   Cardiac Enzymes: No results for input(s): CKTOTAL, CKMB, CKMBINDEX, TROPONINI in the last 168 hours. BNP (last 3 results) No results for input(s): PROBNP in the last 8760 hours. HbA1C: No results for input(s): HGBA1C in the last 72 hours. CBG: Recent Labs  Lab 09/23/20 0742 09/24/20 0722 09/24/20 1708 09/25/20 0728 09/26/20 0741  GLUCAP 85 72 76 100* 122*   Lipid Profile: No results for input(s): CHOL, HDL, LDLCALC, TRIG, CHOLHDL, LDLDIRECT in the last 72 hours. Thyroid Function Tests: No results for input(s): TSH, T4TOTAL, FREET4, T3FREE, THYROIDAB in the last 72 hours. Anemia Panel: No results for input(s): VITAMINB12, FOLATE, FERRITIN, TIBC, IRON, RETICCTPCT in the last 72 hours. Sepsis Labs: Recent Labs  Lab 09/21/20 1126 09/21/20 1323 09/21/20 2023  LATICACIDVEN 2.6* 3.6* 1.2    Recent Results (from the past 240 hour(s))  Urine culture     Status: None   Collection Time: 09/19/20  6:10 PM   Specimen: Urine, Clean Catch  Result Value Ref Range Status   Specimen Description   Final    URINE, CLEAN CATCH Performed at [facility], [place] [address] [place], [place] [address]    Special Requests   Final    URINE, CLEAN CATCH Performed at [facility], [place] [address], [place], [place] [address]    Culture   Final    NO GROWTH Performed at [facility], [place] [address], [place], [place] [address]    Report Status 09/21/2020 FINAL  Final  Culture, blood (Routine x 2)     Status: Abnormal   Collection Time: 09/21/20 11:26 AM   Specimen: BLOOD  Result Value Ref Range Status   Specimen Description   Final    BLOOD BLOOD LEFT FOREARM Performed at [facility] [address], [place],  [address]    Special Requests   Final    BOTTLES DRAWN AEROBIC AND ANAEROBIC Blood Culture  results may not be optimal due to an inadequate volume of blood received in  culture bottles Performed at [place] [address], [place], [place] [address]    Culture  Setup Time   Final    GRAM NEGATIVE RODS IN BOTH AEROBIC AND ANAEROBIC BOTTLES CRITICAL RESULT CALLED TO, READ BACK BY AND VERIFIED WITH: PHARMD Joyice Faster [phone] FCP Performed at [facility], [place] [address] [place], [place] [address]    Culture ENTEROBACTER AEROGENES (A)  Final   Report Status 09/24/2020 FINAL  Final   Organism ID, Bacteria ENTEROBACTER AEROGENES  Final      Susceptibility   Enterobacter aerogenes - MIC*    CEFAZOLIN RESISTANT Resistant     CEFEPIME <=0.12 SENSITIVE Sensitive     CEFTAZIDIME <=1 SENSITIVE Sensitive     CEFTRIAXONE <=0.25 SENSITIVE Sensitive     CIPROFLOXACIN <=0.25 SENSITIVE Sensitive     GENTAMICIN <=1 SENSITIVE Sensitive     IMIPENEM <=0.25 SENSITIVE Sensitive     TRIMETH/SULFA <=20 SENSITIVE Sensitive     PIP/TAZO <=4 SENSITIVE Sensitive     * ENTEROBACTER AEROGENES  Blood Culture ID Panel (Reflexed)     Status: Abnormal   Collection Time: 09/21/20 11:26 AM  Result Value Ref Range Status   Enterococcus faecalis NOT DETECTED NOT DETECTED Final   Enterococcus Faecium NOT DETECTED NOT DETECTED Final   Listeria monocytogenes NOT DETECTED NOT DETECTED Final   Staphylococcus species NOT DETECTED NOT DETECTED Final   Staphylococcus aureus (BCID) NOT DETECTED NOT DETECTED Final   Staphylococcus epidermidis NOT DETECTED NOT DETECTED Final   Staphylococcus lugdunensis NOT DETECTED NOT DETECTED Final   Streptococcus species NOT DETECTED NOT DETECTED Final   Streptococcus agalactiae NOT DETECTED NOT DETECTED Final   Streptococcus pneumoniae NOT DETECTED NOT DETECTED Final   Streptococcus pyogenes NOT DETECTED NOT DETECTED Final   A.calcoaceticus-baumannii NOT DETECTED NOT DETECTED Final   Bacteroides fragilis NOT DETECTED NOT DETECTED Final    Enterobacterales DETECTED (A) NOT DETECTED Final    Comment: Enterobacterales represent a large order of gram negative bacteria, not a single organism. CRITICAL RESULT CALLED TO, READ BACK BY AND VERIFIED WITH: PHARMD NICK G. [phone] FCP    Enterobacter cloacae complex NOT DETECTED NOT DETECTED Final   Escherichia coli NOT DETECTED NOT DETECTED Final   Klebsiella aerogenes DETECTED (A) NOT DETECTED Final    Comment: CRITICAL RESULT CALLED TO, READ BACK BY AND VERIFIED WITH: PHARMD NICK G. [phone] FCP    Klebsiella oxytoca NOT DETECTED NOT DETECTED Final   Klebsiella pneumoniae NOT DETECTED NOT DETECTED Final   Proteus species NOT DETECTED NOT DETECTED Final   Salmonella species NOT DETECTED NOT DETECTED Final   Serratia marcescens NOT DETECTED NOT DETECTED Final   Haemophilus influenzae NOT DETECTED NOT DETECTED Final   Neisseria meningitidis NOT DETECTED NOT DETECTED Final   Pseudomonas aeruginosa NOT DETECTED NOT DETECTED Final   Stenotrophomonas maltophilia NOT DETECTED NOT DETECTED Final   Candida albicans NOT DETECTED NOT DETECTED Final   Candida auris NOT DETECTED NOT DETECTED Final   Candida glabrata NOT DETECTED NOT DETECTED Final   Candida krusei NOT DETECTED NOT DETECTED Final   Candida parapsilosis NOT DETECTED NOT DETECTED Final   Candida tropicalis NOT DETECTED NOT DETECTED Final   Cryptococcus neoformans/gattii NOT DETECTED NOT DETECTED Final   CTX-M ESBL NOT DETECTED NOT DETECTED Final   Carbapenem resistance IMP NOT DETECTED NOT DETECTED Final   Carbapenem resistance KPC NOT DETECTED NOT DETECTED Final   Carbapenem resistance NDM NOT DETECTED NOT DETECTED Final  Carbapenem resist OXA 48 LIKE NOT DETECTED NOT DETECTED Final   Carbapenem resistance VIM NOT DETECTED NOT DETECTED Final    Comment: Performed at [facility], [place] [address] [place], [place] [address]  Resp Panel by RT-PCR (Flu A&B, Covid) Nasopharyngeal Swab     Status: None   Collection  Time: 09/21/20 11:33 AM   Specimen: Nasopharyngeal Swab; Nasopharyngeal(NP) swabs in vial transport medium  Result Value Ref Range Status   SARS Coronavirus 2 by RT PCR NEGATIVE NEGATIVE Final    Comment: (NOTE) SARS-CoV-2 target nucleic acids are NOT DETECTED.  The SARS-CoV-2 RNA is generally detectable in upper respiratory specimens during the acute phase of infection. The lowest concentration of SARS-CoV-2 viral copies this assay can detect is 138 copies/mL. A negative result does not preclude SARS-Cov-2 infection and should not be used as the sole basis for treatment or other patient management decisions. A negative result may occur with  improper specimen collection/handling, submission of specimen other than nasopharyngeal swab, presence of viral mutation(s) within the areas targeted by this assay, and inadequate number of viral copies(<138 copies/mL). A negative result must be combined with clinical observations, patient history, and epidemiological information. The expected result is Negative.  Fact Sheet for Patients:  EntrepreneurPulse.com.au  Fact Sheet for Healthcare Providers:  IncredibleEmployment.be  This test is no t yet approved or cleared by the [place] FDA and  has been authorized for detection and/or diagnosis of SARS-CoV-2 by FDA under an Emergency Use Authorization (EUA). This EUA will remain  in effect (meaning this test can be used) for the duration of the COVID-19 declaration under Section 564(b)(1) of the Act, 21 U.S.C.section 360bbb-3(b)(1), unless the authorization is terminated  or revoked sooner.       Influenza A by PCR NEGATIVE NEGATIVE Final   Influenza B by PCR NEGATIVE NEGATIVE Final    Comment: (NOTE) The Xpert Xpress SARS-CoV-2/FLU/RSV plus assay is intended as an aid in the diagnosis of influenza from Nasopharyngeal swab specimens and should not be used as a sole basis for treatment. Nasal washings  and aspirates are unacceptable for Xpert Xpress SARS-CoV-2/FLU/RSV testing.  Fact Sheet for Patients: EntrepreneurPulse.com.au  Fact Sheet for Healthcare Providers: IncredibleEmployment.be  This test is not yet approved or cleared by the [place] FDA and has been authorized for detection and/or diagnosis of SARS-CoV-2 by FDA under an Emergency Use Authorization (EUA). This EUA will remain in effect (meaning this test can be used) for the duration of the COVID-19 declaration under Section 564(b)(1) of the Act, 21 U.S.C. section 360bbb-3(b)(1), unless the authorization is terminated or revoked.  Performed at [facility], [place] [address] [place], [place] [address]   Culture, blood (Routine x 2)     Status: Abnormal   Collection Time: 09/21/20 11:42 AM   Specimen: BLOOD  Result Value Ref Range Status   Specimen Description   Final    BLOOD PORTA CATH Performed at [place] [address], [place], [place] [address]    Special Requests   Final    BOTTLES DRAWN AEROBIC AND ANAEROBIC Blood Culture adequate volume Performed at [place] [address] [place], [place] [address]    Culture  Setup Time   Final    GRAM NEGATIVE RODS IN BOTH AEROBIC AND ANAEROBIC BOTTLES    Culture (A)  Final    ENTEROBACTER AEROGENES SUSCEPTIBILITIES PERFORMED ON PREVIOUS CULTURE WITHIN THE LAST 5 DAYS. Performed at [facility], [address], [place], [place] [address]    Report Status 09/24/2020 FINAL  Final  Urine culture     Status: None   Collection Time: 09/21/20  2:45 PM   Specimen: In/Out Cath Urine  Result Value Ref Range Status   Specimen Description   Final    IN/OUT CATH URINE Performed at [place] [address], [place], [place] [address]    Special Requests   Final    NONE Performed at [facility], [place] [address],  [place], [place] [address]    Culture   Final    NO GROWTH Performed at [facility], [place] [address] [place], [place] [address]    Report Status 09/23/2020 FINAL  Final  Body fluid culture w Gram Stain     Status: None   Collection Time: 09/21/20  3:15 PM   Specimen: BILE; Body Fluid  Result Value Ref Range Status   Specimen Description   Final    BILE Performed at [place] [address] [place], [place] [address]    Special Requests   Final    Immunocompromised Performed at [facility], [place] [address], [place], [place] [address]    Gram Stain   Final    NO WBC SEEN RARE GRAM NEGATIVE RODS Performed at [facility], [place] [address] [place], [place] [address]    Culture   Final    RARE ENTEROBACTER AEROGENES MODERATE CANDIDA ALBICANS    Report Status 09/25/2020 FINAL  Final   Organism ID, Bacteria ENTEROBACTER AEROGENES  Final      Susceptibility   Enterobacter aerogenes - MIC*    CEFAZOLIN RESISTANT Resistant     CEFEPIME <=0.12 SENSITIVE Sensitive     CEFTAZIDIME <=1 SENSITIVE Sensitive     CEFTRIAXONE <=0.25 SENSITIVE Sensitive     CIPROFLOXACIN <=0.25 SENSITIVE Sensitive     GENTAMICIN <=1 SENSITIVE Sensitive     IMIPENEM <=0.25 SENSITIVE Sensitive     TRIMETH/SULFA <=20 SENSITIVE Sensitive     PIP/TAZO <=4 SENSITIVE Sensitive     * RARE ENTEROBACTER AEROGENES  MRSA PCR Screening     Status: None   Collection Time: 09/21/20  4:44 PM  Result Value Ref Range Status   MRSA by PCR NEGATIVE NEGATIVE Final    Comment:        The GeneXpert MRSA Assay (FDA approved for NASAL specimens only), is one component of a comprehensive MRSA colonization surveillance program. It is not intended to diagnose MRSA infection nor to guide or monitor treatment for MRSA infections. Performed at [facility], [place] [address], [place], [place] [address]      Radiology Studies: No results found.  Scheduled  Meds:  chlorhexidine  15 mL Mouth Rinse BID   Chlorhexidine Gluconate Cloth  6 each Topical Daily   dorzolamide-timolol  1 drop Both Eyes BID   fluconazole  400 mg Oral Daily   latanoprost  1 drop Both Eyes QHS   mouth rinse  15 mL Mouth Rinse q12n4p   Ensure Max Protein  11 oz Oral TID BM   sodium chloride flush  10-40 mL Intracatheter Q12H   sodium chloride flush  5 mL Intracatheter Q8H   Continuous Infusions:  sodium chloride 125 mL/hr at 09/26/20 0613   ceFEPime (MAXIPIME) IV 2 g (09/26/20 0615)     LOS: 5 days    Time spent: 25 mins    Jamariya Davidoff  Dwyane Dee, MD [facility]   If 7PM-7AM, please contact night-coverage

## 2020-09-27 LAB — CBC
HCT: 25.4 % — ABNORMAL LOW (ref 39.0–52.0)
Hemoglobin: 8.2 g/dL — ABNORMAL LOW (ref 13.0–17.0)
MCH: 27.9 pg (ref 26.0–34.0)
MCHC: 32.3 g/dL (ref 30.0–36.0)
MCV: 86.4 fL (ref 80.0–100.0)
Platelets: 241 10*3/uL (ref 150–400)
RBC: 2.94 MIL/uL — ABNORMAL LOW (ref 4.22–5.81)
RDW: 13.8 % (ref 11.5–15.5)
WBC: 3.6 10*3/uL — ABNORMAL LOW (ref 4.0–10.5)
nRBC: 0 % (ref 0.0–0.2)

## 2020-09-27 LAB — COMPREHENSIVE METABOLIC PANEL
ALT: 33 U/L (ref 0–44)
AST: 27 U/L (ref 15–41)
Albumin: 2 g/dL — ABNORMAL LOW (ref 3.5–5.0)
Alkaline Phosphatase: 240 U/L — ABNORMAL HIGH (ref 38–126)
Anion gap: 5 (ref 5–15)
BUN: 9 mg/dL (ref 8–23)
CO2: 23 mmol/L (ref 22–32)
Calcium: 7.8 mg/dL — ABNORMAL LOW (ref 8.9–10.3)
Chloride: 109 mmol/L (ref 98–111)
Creatinine, Ser: 0.63 mg/dL (ref 0.61–1.24)
GFR, Estimated: >60 mL/min (ref >60)
Glucose, Bld: 96 mg/dL (ref 70–99)
Potassium: 3.8 mmol/L (ref 3.5–5.1)
Sodium: 137 mmol/L (ref 135–145)
Total Bilirubin: 1.6 mg/dL — ABNORMAL HIGH (ref 0.3–1.2)
Total Protein: 5.1 g/dL — ABNORMAL LOW (ref 6.5–8.1)

## 2020-09-27 LAB — GLUCOSE, CAPILLARY: Glucose-Capillary: 105 mg/dL — ABNORMAL HIGH (ref 70–99)

## 2020-09-27 NOTE — Progress Notes (Signed)
PROGRESS NOTE    Jonathan Lewis  QBH:[id number] DOB: [birth year] DOA: 09/21/2020 PCP: Biagio Borg, MD    Brief Narrative:  This 80 years old male with PMH significant for metastatic adenocarcinoma of the stomach, s/p Billroth II, DM, HTN who presented in the ED with generalized weakness, fatigue, less interactive,  not getting out of bed.  Patient is found to have biliary obstruction secondary to metastatic cancer.  Biliary stent was attempted  but failed so IR percutaneous drain was placed.  Of note patient presented 2 days ago for urinary catheter exchange , found to be febrile and was prescribed empirical Keflex for UTI.  Urine cultures no growth. Patient was admitted for severe sepsis / septic shock requiring Levophed secondary to Klebsiella bacteremia. Source is still considered biliary.  Infectious disease consulted,  started on IV antibiotics.  Patient underwent percutaneous drain which is draining well.  Liver enzymes trending down. Levophed discontinued.  Assessment & Plan:   Principal Problem:   SIRS (systemic inflammatory response syndrome) (HCC) Active Problems:   Diabetes (HCC)   Benign nodular prostatic hyperplasia   Gastric cancer pT3pN2 s/p distal gastrectomy with Billroth II gastrojejunostomy 03/19/2019   Sepsis ([place])   Severe sepsis / Septic shock secondary to Klebsiella bacteremia: Patient presented with hypotension, lactic acid 3.5 and altered mentation. He briefly required Levophed which was discontinued on 6/21. Blood cultures positive for Klebsiella aerogenes bacteremia. Infectious disease consulted , thinks likely source is biliary. UA is clean, no urinary symptoms. Port-A-Cath is in place, no redness, no swelling , or pain. De-escalate antibiotics to cefepime.   Sepsis physiology improved.  Lactic acid normalized. ID recommended continue cefepime while inpatient, can be de-escalated to Bactrim at discharge. Total duration of antibiotic 14 days.  Acute  metabolic encephalopathy : Resolved. This could be multifactorial related to hypotension and sepsis CT head unremarkable.  Encephalopathy resolved. Continue IV hydration.  Biliary obstruction secondary to metastatic stomach cancer: S/p percutaneous drain by IR.  Draining well.   Patient is status post cholecystostomy tube placement and upsizing of biliary drain on 6/23 IR is planning to cholangiogram and drain capping trial. Bilirubin and LFTs are trending down. IR planning to repeat imaging once drain output is minimal, less than 44ml x 24 hr  Urinary obstruction: Continue Foley catheter.   Hyponatremia: > Improved. Could be likely secondary to dehydration given decreased p.o. intake.   Normocytic normochromic anemia: Baseline hemoglobin 10-11. Continue to monitor.  No obvious bleeding. Hb remains stable.   Adenocarcinoma of the stomach: Oncology is following.   Type 2 diabetes: Continue regular insulin sliding scale.     DVT prophylaxis: SCDs Code Status: DNR Family Communication: No family at bed side. Disposition Plan:   Status is: Inpatient  Remains inpatient appropriate because:Inpatient level of care appropriate due to severity of illness  Dispo: The patient is from: Home              Anticipated d/c is to: SNF              Patient currently is not medically stable to d/c.   Difficult to place patient No  Consultants:  GI IR  Procedures: Percutaneous biliary drain. Antimicrobials:   Anti-infectives (From admission, onward)    Start     Dose/Rate Route Frequency Ordered Stop   09/24/20 1400  fluconazole (DIFLUCAN) tablet 400 mg        400 mg Oral Daily 09/23/20 1655     09/23/20 1830  fluconazole (DIFLUCAN)  IVPB 400 mg        400 mg 100 mL/hr over 120 Minutes Intravenous Every 2 hours 09/23/20 1821 09/23/20 2322   09/23/20 1800  fluconazole (DIFLUCAN) IVPB 800 mg  Status:  Discontinued        800 mg 200 mL/hr over 120 Minutes Intravenous  Once  09/23/20 1655 09/23/20 1821   09/22/20 1300  vancomycin (VANCOREADY) IVPB 1500 mg/300 mL  Status:  Discontinued        1,500 mg 150 mL/hr over 120 Minutes Intravenous Every 24 hours 09/21/20 1606 09/22/20 0858   09/21/20 2100  ceFEPIme (MAXIPIME) 2 g in sodium chloride 0.9 % 100 mL IVPB        2 g 200 mL/hr over 30 Minutes Intravenous Every 8 hours 09/21/20 1606     09/21/20 2000  metroNIDAZOLE (FLAGYL) IVPB 500 mg  Status:  Discontinued        500 mg 100 mL/hr over 60 Minutes Intravenous Every 8 hours 09/21/20 1553 09/22/20 0858   09/21/20 1300  vancomycin (VANCOREADY) IVPB 1500 mg/300 mL        1,500 mg 150 mL/hr over 120 Minutes Intravenous  Once 09/21/20 1155 09/21/20 1514   09/21/20 1200  ceFEPIme (MAXIPIME) 2 g in sodium chloride 0.9 % 100 mL IVPB        2 g 200 mL/hr over 30 Minutes Intravenous  Once 09/21/20 1155 09/21/20 1315   09/21/20 1145  metroNIDAZOLE (FLAGYL) IVPB 500 mg        500 mg 100 mL/hr over 60 Minutes Intravenous  Once 09/21/20 1133 09/21/20 1315        Subjective: Patient was seen and examined at bedside.  Overnight events noted.   He underwent cholecystostomy tube and upsizing of the biliary drain , tolerated well 6/24. He reports feeling better, was having breakfast in the morning.  Objective: Vitals:   09/26/20 1338 09/26/20 2016 09/27/20 0329 09/27/20 1424  BP: 130/74 139/82 117/84 (!) 144/86  Pulse: 69 65 68 64  Resp: 18 15 15 18   Temp: 97.9 F (36.6 C) 98.7 F (37.1 C) 99 F (37.2 C) 97.9 F (36.6 C)  TempSrc: Oral Oral Oral Oral  SpO2: 100% 100% 100% 100%  Weight:      Height:        Intake/Output Summary (Last 24 hours) at 09/27/2020 1442 Last data filed at 09/27/2020 1210 Gross per 24 hour  Intake 600 ml  Output 3640 ml  Net -3040 ml    Filed Weights   09/24/20 0725  Weight: 95 kg    Examination:  General exam: Appears calm and comfortable, not in any acute distress. Respiratory system: Clear to auscultation. Respiratory  effort normal. Cardiovascular system: S1 & S2 heard, RRR. No JVD, murmurs, rubs, gallops or clicks. No pedal edema. Gastrointestinal system: Abdomen is nondistended, soft and nontender. No organomegaly or masses felt. Normal bowel sounds heard.   Biliary drain  x 2  noted.  Central nervous system: Alert and oriented. No focal neurological deficits. Extremities: Symmetric 5 x 5 power.  No edema, no cyanosis, no clubbing. Skin: No rashes, lesions or ulcers Psychiatry: Judgement and insight appear normal. Mood & affect appropriate.     Data Reviewed: I have personally reviewed following labs and imaging studies  CBC: Recent Labs  Lab 09/21/20 1126 09/22/20 0414 09/23/20 0506 09/24/20 0620 09/25/20 0500 09/26/20 0403 09/27/20 0500  WBC 12.7*   < > 6.2 6.1 5.7 4.2 3.6*  NEUTROABS 10.2*  --   --   --   --   --   --  HGB 9.7*   < > 8.8* 8.7* 8.7* 8.7* 8.2*  HCT 28.4*   < > 26.1* 26.3* 26.0* 26.0* 25.4*  MCV 85.0   < > 85.0 84.0 85.8 85.8 86.4  PLT 155   < > 93* 133* 166 213 241   < > = values in this interval not displayed.    Basic Metabolic Panel: Recent Labs  Lab 09/23/20 0506 09/24/20 0620 09/25/20 0500 09/26/20 0403 09/27/20 0500  NA 134* 131* 134* 134* 137  K 4.0 3.7 3.8 3.8 3.8  CL 109 105 108 108 109  CO2 20* 20* 22 23 23   GLUCOSE 103* 93 95 125* 96  BUN 16 12 10 9 9   CREATININE 0.67 0.64 0.67 0.62 0.63  CALCIUM 7.4* 7.5* 7.6* 7.7* 7.8*    GFR: Estimated Creatinine Clearance: 80.9 mL/min (by C-G formula based on SCr of 0.63 mg/dL). Liver Function Tests: Recent Labs  Lab 09/23/20 0506 09/24/20 0620 09/25/20 0500 09/26/20 0403 09/27/20 0500  AST 73* 60* 41 30 27  ALT 61* 55* 44 38 33  ALKPHOS 182* 246* 237* 247* 240*  BILITOT 2.4* 2.4* 2.2* 2.0* 1.6*  PROT 4.9* 5.2* 4.9* 5.2* 5.1*  ALBUMIN 2.0* 2.0* 1.9* 2.0* 2.0*    No results for input(s): LIPASE, AMYLASE in the last 168 hours. No results for input(s): AMMONIA in the last 168 hours. Coagulation  Profile: Recent Labs  Lab 09/21/20 1126 09/22/20 0414 09/24/20 0620  INR 1.6* 1.8* 1.5*    Cardiac Enzymes: No results for input(s): CKTOTAL, CKMB, CKMBINDEX, TROPONINI in the last 168 hours. BNP (last 3 results) No results for input(s): PROBNP in the last 8760 hours. HbA1C: No results for input(s): HGBA1C in the last 72 hours. CBG: Recent Labs  Lab 09/24/20 0722 09/24/20 1708 09/25/20 0728 09/26/20 0741 09/27/20 0737  GLUCAP 72 76 100* 122* 105*    Lipid Profile: No results for input(s): CHOL, HDL, LDLCALC, TRIG, CHOLHDL, LDLDIRECT in the last 72 hours. Thyroid Function Tests: No results for input(s): TSH, T4TOTAL, FREET4, T3FREE, THYROIDAB in the last 72 hours. Anemia Panel: No results for input(s): VITAMINB12, FOLATE, FERRITIN, TIBC, IRON, RETICCTPCT in the last 72 hours. Sepsis Labs: Recent Labs  Lab 09/21/20 1126 09/21/20 1323 09/21/20 2023  LATICACIDVEN 2.6* 3.6* 1.2     Recent Results (from the past 240 hour(s))  Urine culture     Status: None   Collection Time: 09/19/20  6:10 PM   Specimen: Urine, Clean Catch  Result Value Ref Range Status   Specimen Description   Final    URINE, CLEAN CATCH Performed at [facility], [place] [address] [place], [place] [address]    Special Requests   Final    URINE, CLEAN CATCH Performed at [facility], [place] [address], [place], [place] [address]    Culture   Final    NO GROWTH Performed at [facility], [place] [address] [place], [place] [address]    Report Status 09/21/2020 FINAL  Final  Culture, blood (Routine x 2)     Status: Abnormal   Collection Time: 09/21/20 11:26 AM   Specimen: BLOOD  Result Value Ref Range Status   Specimen Description   Final    BLOOD BLOOD LEFT FOREARM Performed at [place] [address], [place],  [address]    Special Requests   Final    BOTTLES DRAWN AEROBIC AND ANAEROBIC Blood Culture results may not be  optimal due to an inadequate  volume of blood received in culture bottles Performed at [place] [address] [place], [place] [address]    Culture  Setup Time   Final    GRAM NEGATIVE RODS IN BOTH AEROBIC AND ANAEROBIC BOTTLES CRITICAL RESULT CALLED TO, READ BACK BY AND VERIFIED WITH: PHARMD Joyice Faster [phone] FCP Performed at [facility], [place] [address], [place], [place] [address]    Culture ENTEROBACTER AEROGENES (A)  Final   Report Status 09/24/2020 FINAL  Final   Organism ID, Bacteria ENTEROBACTER AEROGENES  Final      Susceptibility   Enterobacter aerogenes - MIC*    CEFAZOLIN RESISTANT Resistant     CEFEPIME <=0.12 SENSITIVE Sensitive     CEFTAZIDIME <=1 SENSITIVE Sensitive     CEFTRIAXONE <=0.25 SENSITIVE Sensitive     CIPROFLOXACIN <=0.25 SENSITIVE Sensitive     GENTAMICIN <=1 SENSITIVE Sensitive     IMIPENEM <=0.25 SENSITIVE Sensitive     TRIMETH/SULFA <=20 SENSITIVE Sensitive     PIP/TAZO <=4 SENSITIVE Sensitive     * ENTEROBACTER AEROGENES  Blood Culture ID Panel (Reflexed)     Status: Abnormal   Collection Time: 09/21/20 11:26 AM  Result Value Ref Range Status   Enterococcus faecalis NOT DETECTED NOT DETECTED Final   Enterococcus Faecium NOT DETECTED NOT DETECTED Final   Listeria monocytogenes NOT DETECTED NOT DETECTED Final   Staphylococcus species NOT DETECTED NOT DETECTED Final   Staphylococcus aureus (BCID) NOT DETECTED NOT DETECTED Final   Staphylococcus epidermidis NOT DETECTED NOT DETECTED Final   Staphylococcus lugdunensis NOT DETECTED NOT DETECTED Final   Streptococcus species NOT DETECTED NOT DETECTED Final   Streptococcus agalactiae NOT DETECTED NOT DETECTED Final   Streptococcus pneumoniae NOT DETECTED NOT DETECTED Final   Streptococcus pyogenes NOT DETECTED NOT DETECTED Final   A.calcoaceticus-baumannii NOT DETECTED NOT DETECTED Final   Bacteroides fragilis NOT DETECTED NOT DETECTED Final   Enterobacterales DETECTED  (A) NOT DETECTED Final    Comment: Enterobacterales represent a large order of gram negative bacteria, not a single organism. CRITICAL RESULT CALLED TO, READ BACK BY AND VERIFIED WITH: PHARMD NICK G. [phone] FCP    Enterobacter cloacae complex NOT DETECTED NOT DETECTED Final   Escherichia coli NOT DETECTED NOT DETECTED Final   Klebsiella aerogenes DETECTED (A) NOT DETECTED Final    Comment: CRITICAL RESULT CALLED TO, READ BACK BY AND VERIFIED WITH: PHARMD NICK G. [phone] FCP    Klebsiella oxytoca NOT DETECTED NOT DETECTED Final   Klebsiella pneumoniae NOT DETECTED NOT DETECTED Final   Proteus species NOT DETECTED NOT DETECTED Final   Salmonella species NOT DETECTED NOT DETECTED Final   Serratia marcescens NOT DETECTED NOT DETECTED Final   Haemophilus influenzae NOT DETECTED NOT DETECTED Final   Neisseria meningitidis NOT DETECTED NOT DETECTED Final   Pseudomonas aeruginosa NOT DETECTED NOT DETECTED Final   Stenotrophomonas maltophilia NOT DETECTED NOT DETECTED Final   Candida albicans NOT DETECTED NOT DETECTED Final   Candida auris NOT DETECTED NOT DETECTED Final   Candida glabrata NOT DETECTED NOT DETECTED Final   Candida krusei NOT DETECTED NOT DETECTED Final   Candida parapsilosis NOT DETECTED NOT DETECTED Final   Candida tropicalis NOT DETECTED NOT DETECTED Final   Cryptococcus neoformans/gattii NOT DETECTED NOT DETECTED Final   CTX-M ESBL NOT DETECTED NOT DETECTED Final   Carbapenem resistance IMP NOT DETECTED NOT DETECTED Final   Carbapenem resistance KPC NOT DETECTED NOT DETECTED Final   Carbapenem resistance NDM NOT DETECTED  NOT DETECTED Final   Carbapenem resist OXA 48 LIKE NOT DETECTED NOT DETECTED Final   Carbapenem resistance VIM NOT DETECTED NOT DETECTED Final    Comment: Performed at [facility], [place] [address] [place], [place] [address]  Resp Panel by RT-PCR (Flu A&B, Covid) Nasopharyngeal Swab     Status: None   Collection Time: 09/21/20 11:33 AM    Specimen: Nasopharyngeal Swab; Nasopharyngeal(NP) swabs in vial transport medium  Result Value Ref Range Status   SARS Coronavirus 2 by RT PCR NEGATIVE NEGATIVE Final    Comment: (NOTE) SARS-CoV-2 target nucleic acids are NOT DETECTED.  The SARS-CoV-2 RNA is generally detectable in upper respiratory specimens during the acute phase of infection. The lowest concentration of SARS-CoV-2 viral copies this assay can detect is 138 copies/mL. A negative result does not preclude SARS-Cov-2 infection and should not be used as the sole basis for treatment or other patient management decisions. A negative result may occur with  improper specimen collection/handling, submission of specimen other than nasopharyngeal swab, presence of viral mutation(s) within the areas targeted by this assay, and inadequate number of viral copies(<138 copies/mL). A negative result must be combined with clinical observations, patient history, and epidemiological information. The expected result is Negative.  Fact Sheet for Patients:  EntrepreneurPulse.com.au  Fact Sheet for Healthcare Providers:  IncredibleEmployment.be  This test is no t yet approved or cleared by the [place] FDA and  has been authorized for detection and/or diagnosis of SARS-CoV-2 by FDA under an Emergency Use Authorization (EUA). This EUA will remain  in effect (meaning this test can be used) for the duration of the COVID-19 declaration under Section 564(b)(1) of the Act, 21 U.S.C.section 360bbb-3(b)(1), unless the authorization is terminated  or revoked sooner.       Influenza A by PCR NEGATIVE NEGATIVE Final   Influenza B by PCR NEGATIVE NEGATIVE Final    Comment: (NOTE) The Xpert Xpress SARS-CoV-2/FLU/RSV plus assay is intended as an aid in the diagnosis of influenza from Nasopharyngeal swab specimens and should not be used as a sole basis for treatment. Nasal washings and aspirates are  unacceptable for Xpert Xpress SARS-CoV-2/FLU/RSV testing.  Fact Sheet for Patients: EntrepreneurPulse.com.au  Fact Sheet for Healthcare Providers: IncredibleEmployment.be  This test is not yet approved or cleared by the [place] FDA and has been authorized for detection and/or diagnosis of SARS-CoV-2 by FDA under an Emergency Use Authorization (EUA). This EUA will remain in effect (meaning this test can be used) for the duration of the COVID-19 declaration under Section 564(b)(1) of the Act, 21 U.S.C. section 360bbb-3(b)(1), unless the authorization is terminated or revoked.  Performed at [facility], [place] [address], [place], [place] [address]   Culture, blood (Routine x 2)     Status: Abnormal   Collection Time: 09/21/20 11:42 AM   Specimen: BLOOD  Result Value Ref Range Status   Specimen Description   Final    BLOOD PORTA CATH Performed at [place] [address], [place], [place] [address]    Special Requests   Final    BOTTLES DRAWN AEROBIC AND ANAEROBIC Blood Culture adequate volume Performed at [place] [address], [place],  [address]    Culture  Setup Time   Final    GRAM NEGATIVE RODS IN BOTH AEROBIC AND ANAEROBIC BOTTLES    Culture (A)  Final    ENTEROBACTER AEROGENES SUSCEPTIBILITIES PERFORMED ON PREVIOUS CULTURE WITHIN THE LAST 5 DAYS.  Performed at [facility], [place] [address], [place], [place] [address]    Report Status 09/24/2020 FINAL  Final  Urine culture     Status: None   Collection Time: 09/21/20  2:45 PM   Specimen: In/Out Cath Urine  Result Value Ref Range Status   Specimen Description   Final    IN/OUT CATH URINE Performed at [place] [address] [place], [place] [address]    Special Requests   Final    NONE Performed at [facility], [place] [address], [place], [place]  [address]    Culture   Final    NO GROWTH Performed at [facility], [place] [address], [place], [place] [address]    Report Status 09/23/2020 FINAL  Final  Body fluid culture w Gram Stain     Status: None   Collection Time: 09/21/20  3:15 PM   Specimen: BILE; Body Fluid  Result Value Ref Range Status   Specimen Description   Final    BILE Performed at [place] [address], [place], [place] [address]    Special Requests   Final    Immunocompromised Performed at [facility], [place] [address] [place], [place] [address]    Gram Stain   Final    NO WBC SEEN RARE GRAM NEGATIVE RODS Performed at [facility], [place] [address], [place], [place] [address]    Culture   Final    RARE ENTEROBACTER AEROGENES MODERATE CANDIDA ALBICANS    Report Status 09/25/2020 FINAL  Final   Organism ID, Bacteria ENTEROBACTER AEROGENES  Final      Susceptibility   Enterobacter aerogenes - MIC*    CEFAZOLIN RESISTANT Resistant     CEFEPIME <=0.12 SENSITIVE Sensitive     CEFTAZIDIME <=1 SENSITIVE Sensitive     CEFTRIAXONE <=0.25 SENSITIVE Sensitive     CIPROFLOXACIN <=0.25 SENSITIVE Sensitive     GENTAMICIN <=1 SENSITIVE Sensitive     IMIPENEM <=0.25 SENSITIVE Sensitive     TRIMETH/SULFA <=20 SENSITIVE Sensitive     PIP/TAZO <=4 SENSITIVE Sensitive     * RARE ENTEROBACTER AEROGENES  MRSA PCR Screening     Status: None   Collection Time: 09/21/20  4:44 PM  Result Value Ref Range Status   MRSA by PCR NEGATIVE NEGATIVE Final    Comment:        The GeneXpert MRSA Assay (FDA approved for NASAL specimens only), is one component of a comprehensive MRSA colonization surveillance program. It is not intended to diagnose MRSA infection nor to guide or monitor treatment for MRSA infections. Performed at [facility], [place] [address], [place],  [address]      Radiology Studies: No results found.  Scheduled Meds:   chlorhexidine  15 mL Mouth Rinse BID   Chlorhexidine Gluconate Cloth  6 each Topical Daily   dorzolamide-timolol  1 drop Both Eyes BID   fluconazole  400 mg Oral Daily   latanoprost  1 drop Both Eyes QHS   mouth rinse  15 mL Mouth Rinse q12n4p   Ensure Max Protein  11 oz Oral TID BM   sodium chloride flush  10-40 mL Intracatheter Q12H   sodium chloride flush  5 mL Intracatheter Q8H   Continuous Infusions:  sodium chloride 125 mL/hr at 09/27/20 1316   ceFEPime (MAXIPIME) IV 2 g (09/27/20 1316)     LOS: 6 days    Time spent: 25  mins    Shawna Clamp, MD [facility]   If 7PM-7AM, please contact night-coverage

## 2020-09-27 NOTE — Evaluation (Signed)
Physical Therapy Evaluation Patient Details Name: Jonathan Lewis MRN: [id number] DOB: 20-Sep-1940 Today's Date: 09/27/2020   History of Present Illness  80 years old male admitted with generalized weakness, fatigue, less interactive,  not getting out of bed.   PMH: C3-6 ACDF, metastatic adenocarcinoma of the stomach, s/p Billroth II, DM, HTN  .  Patient  found to have biliary obstruction secondary to metastatic cancer.  Biliary stent was attempted  but failed so IR percutaneous drain was placed.  Clinical Impression  Patient evaluated by Physical Therapy with no further acute PT needs identified. All education has been completed and the patient has no further questions.  Pt with slightly unsteady gait without IV pole support however no overt LOB, discussed use of cane at home initially as pt is safer and more confident with unilateral UE support   See below for any follow-up Physical Therapy or equipment needs. PT is signing off. Thank you for this referral.     Follow Up Recommendations      Equipment Recommendations  None recommended by PT (pt states he has a cane)    Recommendations for Other Services       Precautions / Restrictions Precautions Precautions: Fall Restrictions Weight Bearing Restrictions: No      Mobility  Bed Mobility               General bed mobility comments: NT- pt in recliner on arrival    Transfers Overall transfer level: Needs assistance Equipment used: Rolling walker (2 wheeled) Transfers: Sit to/from Stand Sit to Stand: Supervision            Ambulation/Gait Ambulation/Gait assistance: Supervision;Min guard Gait Distance (Feet): 300 Feet Assistive device: IV Pole;None Gait Pattern/deviations: Step-through pattern;Decreased stride length;Trunk flexed     General Gait Details: pt able to amb without IV pole support but with unsteady gait, decr stride length intially, stability improved with distance, more stable and confident with  IV pole support (discussed with pt this is similiar to use of cane)  Stairs            Wheelchair Mobility    Modified Rankin (Stroke Patients Only)       Balance Overall balance assessment: Needs assistance   Sitting balance-Leahy Scale: Good       Standing balance-Leahy Scale: Fair Standing balance comment: no LOB however not tested to mod challenges                             Pertinent Vitals/Pain Pain Assessment: No/denies pain    Home Living Family/patient expects to be discharged to:: Private residence Living Arrangements: Spouse/significant other Available Help at Discharge: Family;Available 24 hours/day Type of Home: House Home Access: Stairs to enter Entrance Stairs-Rails: Psychiatric nurse of Steps: 4 Home Layout: One level Home Equipment: Walker - 2 wheels;Cane - single point Additional Comments: pt states they have access to canes or walker if needed    Prior Function Level of Independence: Independent               Hand Dominance        Extremity/Trunk Assessment   Upper Extremity Assessment Upper Extremity Assessment: Overall WFL for tasks assessed    Lower Extremity Assessment Lower Extremity Assessment: Overall WFL for tasks assessed       Communication   Communication: No difficulties  Cognition Arousal/Alertness: Awake/alert Behavior During Therapy: WFL for tasks assessed/performed Overall Cognitive Status: Within Functional Limits  for tasks assessed                                        General Comments      Exercises     Assessment/Plan    PT Assessment Patent does not need any further PT services  PT Problem List         PT Treatment Interventions      PT Goals (Current goals can be found in the Care Plan section)  Acute Rehab PT Goals Patient Stated Goal: home soon PT Goal Formulation: All assessment and education complete, DC therapy    Frequency      Barriers to discharge        Co-evaluation               AM-PAC PT "6 Clicks" Mobility  Outcome Measure Help needed turning from your back to your side while in a flat bed without using bedrails?: A Little Help needed moving from lying on your back to sitting on the side of a flat bed without using bedrails?: A Little Help needed moving to and from a bed to a chair (including a wheelchair)?: None Help needed standing up from a chair using your arms (e.g., wheelchair or bedside chair)?: None Help needed to walk in hospital room?: None Help needed climbing 3-5 steps with a railing? : A Little 6 Click Score: 21    End of Session   Activity Tolerance: Patient tolerated treatment well Patient left: with call bell/phone within reach;in chair;with chair alarm set   PT Visit Diagnosis: Other abnormalities of gait and mobility (R26.89)    Time: 4037-0964 PT Time Calculation (min) (ACUTE ONLY): 22 min   Charges:   PT Evaluation $PT Eval Low Complexity: [place], PT  Acute Rehab Dept ([place]) [phone] Pager [phone]  09/27/2020   [facility] 09/27/2020, 1:34 PM

## 2020-09-27 NOTE — Progress Notes (Signed)
Pharmacy Antibiotic Note  Jonathan Lewis is a 80 y.o. male with a h/o metastatic gastric adenocarcinoma . Pt recently admitted with biliary obstruction s/p biliary stent placement on 09/10/20. Pt presented to ED on 6/20 with sepsis.   Pt currently on antibiotics for enterobacterales, klebsiella  aerogenes bacteremia. ID following, source likely biliary.   Today, 09/27/20 - Day #Cefepime WBC now decreased 3.6 SCr WNL, stable. CrCl ~80 mL/min Tmax 99.9  Plan: Continue cefepime 2 g IV q8h Fluconazole PO per ID  Height: 5\' 8"  (172.7 cm) Weight: 95 kg (209 lb 7 oz) IBW/kg (Calculated) : 68.4  Temp (24hrs), Avg:98.5 F (36.9 C), Min:97.9 F (36.6 C), Max:99 F (37.2 C)  Recent Labs  Lab 09/21/20 1126 09/21/20 1323 09/21/20 2020 09/21/20 2023 09/22/20 0414 09/23/20 0506 09/24/20 0620 09/25/20 0500 09/26/20 0403 09/27/20 0500  WBC 12.7*  --   --   --    < > 6.2 6.1 5.7 4.2 3.6*  CREATININE 0.96  --    < >  --    < > 0.67 0.64 0.67 0.62 0.63  LATICACIDVEN 2.6* 3.6*  --  1.2  --   --   --   --   --   --    < > = values in this interval not displayed.     Estimated Creatinine Clearance: 80.9 mL/min (by C-G formula based on SCr of 0.63 mg/dL).    No Known Allergies  Antimicrobials this admission: 6/20 vanc > 6/21 6/20 metronidazole > 6/21 6/20 cefepime >> 6/22 fluconazole >>  Dose adjustments this admission:  Microbiology results: 6/20 BCx: 4/4 GNR; enterobacter aerogenes (R cefazolin); Klebsiella aerogenes 6/20 UCx:  ngF 6/20 bile cx: rare enterobacter aerogenes (R cefazolin), moderate candida albicans  Thank you for allowing pharmacy to be a part of this patient's care.  Peggyann Juba, PharmD, BCPS Pharmacy: [phone] 09/27/2020 11:24 AM

## 2020-09-28 ENCOUNTER — Ambulatory Visit
Admission: RE | Admit: 2020-09-28 | Discharge: 2020-09-28 | Disposition: A | Discharge status: Home / Self Care | Payer: Medicare PPO | Source: Ambulatory Visit | Attending: Radiation Oncology | Admitting: Radiation Oncology

## 2020-09-28 LAB — GLUCOSE, CAPILLARY
Glucose-Capillary: 98 mg/dL (ref 70–99)
Glucose-Capillary: 83 mg/dL (ref 70–99)

## 2020-09-28 NOTE — Progress Notes (Signed)
PT Cancellation Note  Patient Details Name: JOHNATHEN TESTA MRN: [id number] DOB: [birth year]   Cancelled Treatment:    Reason Eval/Treat Not Completed: Other (comment) (Please see PT eval from 09/27/20. No skilled PT needs identified at this time. Recommend home with family.)   Festus Barren., PT, DPT  Acute Rehabilitation Services  Office [phone]  09/28/2020, 10:02 AM

## 2020-09-28 NOTE — Evaluation (Signed)
Occupational Therapy Evaluation Patient Details Name: Jonathan Lewis MRN: [id number] DOB: [birth year] Today's Date: 09/28/2020    History of Present Illness 80 years old male admitted with generalized weakness, fatigue, less interactive,  not getting out of bed.   PMH: C3-6 ACDF, metastatic adenocarcinoma of the stomach, s/p Billroth II, DM, HTN  .  Patient  found to have biliary obstruction secondary to metastatic cancer.  Biliary stent was attempted  but failed so IR percutaneous drain was placed.   Clinical Impression   Pt is at Sup level with ADLs/selfcare and mobility using no AD. Pt will have 24/7 assist at home as needed. All education is completed and no further acute OT services are indicated at this time, OT will sign off    Follow Up Recommendations  No OT follow up;Supervision - Intermittent    Equipment Recommendations  Tub/shower bench    Recommendations for Other Services       Precautions / Restrictions Precautions Precautions: Fall Restrictions Weight Bearing Restrictions: No      Mobility Bed Mobility               General bed mobility comments: in recliner upon arrival    Transfers Overall transfer level: Needs assistance Equipment used: Rolling walker (2 wheeled) Transfers: Sit to/from Stand Sit to Stand: Supervision              Balance Overall balance assessment: Needs assistance   Sitting balance-Leahy Scale: Good     Standing balance support: No upper extremity supported;During functional activity;Single extremity supported Standing balance-Leahy Scale: Fair Standing balance comment: no LOB however not tested to mod challenges                           ADL either performed or assessed with clinical judgement   ADL                                         General ADL Comments: supervision     Vision Baseline Vision/History: Wears glasses Wears Glasses: At all times Patient Visual Report: No  change from baseline       Perception     Praxis      Pertinent Vitals/Pain Pain Assessment: No/denies pain Pain Score: 0-No pain Faces Pain Scale: No hurt Pain Intervention(s): Monitored during session     Hand Dominance Left   Extremity/Trunk Assessment Upper Extremity Assessment Upper Extremity Assessment: Overall WFL for tasks assessed   Lower Extremity Assessment Lower Extremity Assessment: Defer to PT evaluation       Communication Communication Communication: No difficulties   Cognition Arousal/Alertness: Awake/alert Behavior During Therapy: WFL for tasks assessed/performed Overall Cognitive Status: Within Functional Limits for tasks assessed                                     General Comments       Exercises     Shoulder Instructions      Home Living Family/patient expects to be discharged to:: Private residence Living Arrangements: Spouse/significant other Available Help at Discharge: Family;Available 24 hours/day Type of Home: House Home Access: Stairs to enter [employer] of Steps: 4 Entrance Stairs-Rails: Right;Left Home Layout: One level     Bathroom Shower/Tub: Teacher, early years/pre: Handicapped height  Home Equipment: [place] - 2 wheels;Cane - single point   Additional Comments: pt states they have access to canes or walker if needed      Prior Functioning/Environment Level of Independence: Independent        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 Problem List: Impaired balance (sitting and/or standing);Decreased activity tolerance;Decreased knowledge of use of DME or AE      OT Treatment/Interventions:      OT Goals(Current goals can be found in the care plan section) Acute Rehab OT Goals Patient Stated Goal: home soon OT Goal Formulation: With patient/family  OT Frequency:     Barriers to D/C:            Co-evaluation              AM-PAC OT "6 Clicks" Daily  Activity     Outcome Measure Help from another person eating meals?: None Help from another person taking care of personal grooming?: A Little Help from another person toileting, which includes using toliet, bedpan, or urinal?: A Little Help from another person bathing (including washing, rinsing, drying)?: A Little Help from another person to put on and taking off regular upper body clothing?: None Help from another person to put on and taking off regular lower body clothing?: A Little 6 Click Score: 20   End of Session    Activity Tolerance: Patient tolerated treatment well Patient left: in bed;with bed alarm set;with family/visitor present  OT Visit Diagnosis: Muscle weakness (generalized) (M62.81)                Time: 7124-5809 OT Time Calculation (min): 29 min Charges:  OT General Charges $OT Visit: 1 Visit OT Evaluation $OT Eval Low Complexity: 1 Low OT Treatments $Self Care/Home Management : 8-22 mins    Jonathan Lewis 09/28/2020, 12:20 PM

## 2020-09-28 NOTE — Progress Notes (Addendum)
Referring Physician(s): Kumar,P/Ennever,P  Supervising Physician: Markus Daft  Patient Status:  [facility] - In-pt  Chief Complaint: Gastric cancer, obstructive jaundice, cholecystitis/bacteremia   Subjective: Patient currently doing okay.  Denies significant abdominal pain or nausea/vomiting   Allergies: Patient has no known allergies.  Medications: Prior to Admission medications   Medication Sig Start Date End Date Taking? Authorizing Provider  acetaminophen (TYLENOL) 160 MG/5ML liquid Take 500 mg by mouth every 4 (four) hours as needed for fever.   Yes [provider]  atorvastatin (LIPITOR) 20 MG tablet Take 1 tablet (20 mg total) by mouth daily. Patient taking differently: Take 20 mg by mouth in the morning. 04/26/20 04/26/21 Yes Biagio Borg, MD  cephALEXin (KEFLEX) 500 MG capsule Take 1 capsule (500 mg total) by mouth 4 (four) times daily. Patient taking differently: Take 500 mg by mouth 4 (four) times daily. Start date : 09/20/20 09/19/20  Yes Sherrill Raring, PA-C  dorzolamide-timolol (COSOPT) 22.3-6.8 MG/ML ophthalmic solution Place 1 drop into both eyes 2 (two) times daily. 06/15/20  Yes [provider]  famotidine (PEPCID) 20 MG tablet TAKE 1 TABLET BY MOUTH EVERY DAY Patient taking differently: Take 20 mg by mouth daily. 09/10/20  Yes Ennever, Rudell Cobb, MD  latanoprost (XALATAN) 0.005 % ophthalmic solution Place 1 drop into both eyes at bedtime.  01/09/19  Yes [provider]  lidocaine-prilocaine (EMLA) cream Apply 1 application topically as needed. Place on the port one hour before appointment. Patient taking differently: Apply 1 application topically daily as needed (port access). Place on the port one hour before appointment. 08/24/20  Yes Volanda Napoleon, MD  meclizine (ANTIVERT) 12.5 MG tablet TAKE 1 TABLET BY MOUTH 3 TIMES DAILY AS NEEDED FOR DIZZINESS. Patient taking differently: Take 12.5 mg by mouth daily as needed for dizziness. 08/10/20  Yes Biagio Borg, MD  Multiple Vitamin (MULTIVITAMIN WITH MINERALS) TABS tablet Take 1 tablet by mouth in the morning. Men's One-A-Day Chewable   Yes [provider]  ondansetron (ZOFRAN) 8 MG tablet Take 1 tablet (8 mg total) by mouth every 8 (eight) hours as needed for nausea or vomiting. 09/16/20  Yes Hayden Pedro, PA-C  senna-docusate (SENOKOT S) 8.6-50 MG tablet Take 1 tablet by mouth daily. 09/12/20  Yes Domenic Polite, MD  SYSTANE ULTRA 0.4-0.3 % SOLN Place 1 drop into both eyes 3 (three) times daily as needed (dry eyes). 01/09/19  Yes [provider]  traMADol (ULTRAM) 50 MG tablet Take 2 tablets (100 mg total) by mouth at bedtime as needed for moderate pain or severe pain. 04/22/20  Yes Biagio Borg, MD     Vital Signs: BP 123/65 (BP Location: Right Arm)   Pulse 63   Temp 98.3 F (36.8 C)   Resp 20   Ht 5\' 8"  (1.727 m)   Wt 209 lb 7 oz (95 kg)   SpO2 100%   BMI 31.84 kg/m   Physical Exam awake, alert.  Gallbladder and biliary drains are intact, insertion sites okay, not significantly tender to palpation, output from biliary drain 50 cc green bile, GB drain with 750 cc of blood-tinged bile- drain flushed without difficulty  Imaging: No results found.  Labs:  CBC: Recent Labs    09/24/20 0620 09/25/20 0500 09/26/20 0403 09/27/20 0500  WBC 6.1 5.7 4.2 3.6*  HGB 8.7* 8.7* 8.7* 8.2*  HCT 26.3* 26.0* 26.0* 25.4*  PLT 133* 166 213 241    COAGS: Recent Labs  01/06/20 0915 09/09/20 1900 09/21/20 1126 09/22/20 0414 09/24/20 0620  INR 1.0 1.0 1.6* 1.8* 1.5*  APTT 29  --  35  --   --     BMP: Recent Labs    10/30/19 0900 11/20/19 0900 12/11/19 1000 12/31/19 1056 02/18/20 1025 09/24/20 0620 09/25/20 0500 09/26/20 0403 09/27/20 0500  NA 141 139 141 140   < > 131* 134* 134* 137  K 3.5 4.0 4.0 4.0   < > 3.7 3.8 3.8 3.8  CL 108 104 107 106   < > 105 108 108 109  CO2 27 30 29 28    < > 20* 22 23 23   GLUCOSE 173* 103* 105* 127*   < > 93 95  125* 96  BUN 12 18 13 16    < > 12 10 9 9   CALCIUM 9.2 9.8 9.4 9.4   < > 7.5* 7.6* 7.7* 7.8*  CREATININE 0.76 0.90 0.83 0.87   < > 0.64 0.67 0.62 0.63  GFRNONAA >60 >60 >60 >60   < > >60 >60 >60 >60  GFRAA >60 >60 >60 >60  --   --   --   --   --    < > = values in this interval not displayed.    LIVER FUNCTION TESTS: Recent Labs    09/24/20 0620 09/25/20 0500 09/26/20 0403 09/27/20 0500  BILITOT 2.4* 2.2* 2.0* 1.6*  AST 60* 41 30 27  ALT 55* 44 38 33  ALKPHOS 246* 237* 247* 240*  PROT 5.2* 4.9* 5.2* 5.1*  ALBUMIN 2.0* 1.9* 2.0* 2.0*    Assessment and Plan: 80 y.o. male with history of stage III gastric cancer s/p distal gastrostomy in 03/2019, obstructive jaundice status post failed attempt at ERCP biliary stent placement on 09/09/20, s/p percutaneous biliary drainage placement, with IR on 6/9; s/p exchange/upsize bili drain and perc GB drain placement with Dr. Pascal Lux on 6/23; afebrile, no new labs today; last bilirubin yesterday was 1.6 down from 2; WBC 3.6, hgb 8.2; per Dr. Pascal Lux once patient's bilirubin has hit a nadir, the patient may return for repeat cholangiogram and potential initiation of a capping trial of the percutaneous biliary drainage catheter; GB drain will need to remain in place 4-6 weeks to allow for tract maturation before removing   Electronically Signed: D. Rowe Robert, PA-C 09/28/2020, 1:48 PM   I spent a total of 15 Minutes at the the patient's bedside AND on the patient's hospital floor or unit, greater than 50% of which was counseling/coordinating care for biliary drain and gallbladder drain    [name], male   DOB: 1941/01/04, 80 y.o.   MRN: [id number]

## 2020-09-28 NOTE — Progress Notes (Signed)
PROGRESS NOTE    Jonathan Lewis  HQI:[id number] DOB: 1940/11/16 DOA: 09/21/2020 PCP: Biagio Borg, MD    Brief Narrative:  This 80 years old male with PMH significant for metastatic adenocarcinoma of the stomach, s/p Billroth II, DM, HTN who presented in the ED with generalized weakness, fatigue, less interactive,  not getting out of bed.  Patient is found to have biliary obstruction secondary to metastatic cancer.  Biliary stent was attempted  but failed so IR percutaneous drain was placed.  Of note patient presented 2 days ago for urinary catheter exchange , found to be febrile and was prescribed empirical Keflex for UTI.  Urine cultures no growth. Patient was admitted for severe sepsis / septic shock requiring Levophed secondary to Klebsiella bacteremia. Source is still considered biliary.  Infectious disease consulted,  started on IV antibiotics.  Patient underwent percutaneous drain which is draining well.  Liver enzymes trending down. Levophed discontinued.  Assessment & Plan:   Principal Problem:   SIRS (systemic inflammatory response syndrome) (HCC) Active Problems:   Diabetes (HCC)   Benign nodular prostatic hyperplasia   Gastric cancer pT3pN2 s/p distal gastrectomy with Billroth II gastrojejunostomy 03/19/2019   Sepsis ([place])   Severe sepsis / Septic shock secondary to Klebsiella bacteremia: Patient presented with hypotension, lactic acid 3.5 and altered mentation. He briefly required Levophed which was discontinued on 6/21. Blood cultures positive for Klebsiella aerogenes bacteremia. Infectious disease consulted , thinks likely source is biliary. UA is clean, no urinary symptoms. Port-A-Cath is in place, no redness, no swelling , or pain. De-escalate antibiotics to cefepime.   Sepsis physiology improved.  Lactic acid normalized. ID recommended continue cefepime while inpatient, can be de-escalated to Bactrim at discharge. Total duration of antibiotic 14 days.  Acute  metabolic encephalopathy : Resolved. This could be multifactorial related to hypotension and sepsis CT head unremarkable.  Encephalopathy resolved.   Biliary obstruction secondary to metastatic stomach cancer: S/p percutaneous drain by IR.  Draining well.   Patient is status post cholecystostomy tube placement and upsizing of biliary drain on 6/23 IR is planning to cholangiogram and drain capping trial once Bilirubin normalize. Bilirubin and LFTs are trending down. IR planning to repeat imaging once drain output is minimal, less than 31ml x 24 hr  Urinary obstruction: Continue Foley catheter.   Hyponatremia: > Improved. Could be likely secondary to dehydration given decreased p.o. intake.   Normocytic normochromic anemia: Baseline hemoglobin 10-11. Continue to monitor.  No obvious bleeding. Hb remains stable.   Adenocarcinoma of the stomach: Oncology is following.   Type 2 diabetes: Continue regular insulin sliding scale.     DVT prophylaxis: SCDs Code Status: DNR Family Communication: No family at bed side. Disposition Plan:   Status is: Inpatient  Remains inpatient appropriate because:Inpatient level of care appropriate due to severity of illness  Dispo: The patient is from: Home              Anticipated d/c is to:  Home with Home PT.              Patient currently is not medically stable to d/c.   Difficult to place patient No  Consultants:  GI IR  Procedures: Percutaneous biliary drain. Antimicrobials:   Anti-infectives (From admission, onward)    Start     Dose/Rate Route Frequency Ordered Stop   09/24/20 1400  fluconazole (DIFLUCAN) tablet 400 mg        400 mg Oral Daily 09/23/20 1655  09/23/20 1830  fluconazole (DIFLUCAN) IVPB 400 mg        400 mg 100 mL/hr over 120 Minutes Intravenous Every 2 hours 09/23/20 1821 09/23/20 2322   09/23/20 1800  fluconazole (DIFLUCAN) IVPB 800 mg  Status:  Discontinued        800 mg 200 mL/hr over 120 Minutes  Intravenous  Once 09/23/20 1655 09/23/20 1821   09/22/20 1300  vancomycin (VANCOREADY) IVPB 1500 mg/300 mL  Status:  Discontinued        1,500 mg 150 mL/hr over 120 Minutes Intravenous Every 24 hours 09/21/20 1606 09/22/20 0858   09/21/20 2100  ceFEPIme (MAXIPIME) 2 g in sodium chloride 0.9 % 100 mL IVPB        2 g 200 mL/hr over 30 Minutes Intravenous Every 8 hours 09/21/20 1606     09/21/20 2000  metroNIDAZOLE (FLAGYL) IVPB 500 mg  Status:  Discontinued        500 mg 100 mL/hr over 60 Minutes Intravenous Every 8 hours 09/21/20 1553 09/22/20 0858   09/21/20 1300  vancomycin (VANCOREADY) IVPB 1500 mg/300 mL        1,500 mg 150 mL/hr over 120 Minutes Intravenous  Once 09/21/20 1155 09/21/20 1514   09/21/20 1200  ceFEPIme (MAXIPIME) 2 g in sodium chloride 0.9 % 100 mL IVPB        2 g 200 mL/hr over 30 Minutes Intravenous  Once 09/21/20 1155 09/21/20 1315   09/21/20 1145  metroNIDAZOLE (FLAGYL) IVPB 500 mg        500 mg 100 mL/hr over 60 Minutes Intravenous  Once 09/21/20 1133 09/21/20 1315        Subjective: Patient was seen and examined at bedside.  Overnight events noted.   He underwent cholecystostomy tube and upsizing of the biliary drain , tolerated well 6/24. He reports feeling better, grandson was there, shaving his head, denies any pain.  Objective: Vitals:   09/27/20 1424 09/27/20 2047 09/28/20 0510 09/28/20 1501  BP: (!) 144/86 114/73 123/65 (!) 160/82  Pulse: 64 76 63 (!) 59  Resp: 18 18 20 20   Temp: 97.9 F (36.6 C) 98.7 F (37.1 C) 98.3 F (36.8 C) 98.3 F (36.8 C)  TempSrc: Oral     SpO2: 100% 100% 100% 100%  Weight:      Height:        Intake/Output Summary (Last 24 hours) at 09/28/2020 1555 Last data filed at 09/28/2020 1314 Gross per 24 hour  Intake 3612.81 ml  Output 3275 ml  Net 337.81 ml   Filed Weights   09/24/20 0725  Weight: 95 kg    Examination:  General exam: Appears calm and comfortable, not in any acute distress. Appears  cheerful. Respiratory system: Clear to auscultation. Respiratory effort normal. Cardiovascular system: S1 & S2 heard, RRR. No JVD, murmurs, rubs, gallops or clicks. No pedal edema. Gastrointestinal system: Abdomen is nondistended, soft and nontender. No organomegaly or masses felt. Normal bowel sounds heard.   Biliary drain  x 2  noted.  Central nervous system: Alert and oriented. No focal neurological deficits. Extremities: No edema, no cyanosis, no clubbing. Skin: No rashes, lesions or ulcers Psychiatry: Judgement and insight appear normal. Mood & affect appropriate.     Data Reviewed: I have personally reviewed following labs and imaging studies  CBC: Recent Labs  Lab 09/23/20 0506 09/24/20 0620 09/25/20 0500 09/26/20 0403 09/27/20 0500  WBC 6.2 6.1 5.7 4.2 3.6*  HGB 8.8* 8.7* 8.7* 8.7* 8.2*  HCT 26.1*  26.3* 26.0* 26.0* 25.4*  MCV 85.0 84.0 85.8 85.8 86.4  PLT 93* 133* 166 213 026   Basic Metabolic Panel: Recent Labs  Lab 09/23/20 0506 09/24/20 0620 09/25/20 0500 09/26/20 0403 09/27/20 0500  NA 134* 131* 134* 134* 137  K 4.0 3.7 3.8 3.8 3.8  CL 109 105 108 108 109  CO2 20* 20* 22 23 23   GLUCOSE 103* 93 95 125* 96  BUN 16 12 10 9 9   CREATININE 0.67 0.64 0.67 0.62 0.63  CALCIUM 7.4* 7.5* 7.6* 7.7* 7.8*   GFR: Estimated Creatinine Clearance: 80.9 mL/min (by C-G formula based on SCr of 0.63 mg/dL). Liver Function Tests: Recent Labs  Lab 09/23/20 0506 09/24/20 0620 09/25/20 0500 09/26/20 0403 09/27/20 0500  AST 73* 60* 41 30 27  ALT 61* 55* 44 38 33  ALKPHOS 182* 246* 237* 247* 240*  BILITOT 2.4* 2.4* 2.2* 2.0* 1.6*  PROT 4.9* 5.2* 4.9* 5.2* 5.1*  ALBUMIN 2.0* 2.0* 1.9* 2.0* 2.0*   No results for input(s): LIPASE, AMYLASE in the last 168 hours. No results for input(s): AMMONIA in the last 168 hours. Coagulation Profile: Recent Labs  Lab 09/22/20 0414 09/24/20 0620  INR 1.8* 1.5*   Cardiac Enzymes: No results for input(s): CKTOTAL, CKMB, CKMBINDEX,  TROPONINI in the last 168 hours. BNP (last 3 results) No results for input(s): PROBNP in the last 8760 hours. HbA1C: No results for input(s): HGBA1C in the last 72 hours. CBG: Recent Labs  Lab 09/24/20 1708 09/25/20 0728 09/26/20 0741 09/27/20 0737 09/28/20 0751  GLUCAP 76 100* 122* 105* 98   Lipid Profile: No results for input(s): CHOL, HDL, LDLCALC, TRIG, CHOLHDL, LDLDIRECT in the last 72 hours. Thyroid Function Tests: No results for input(s): TSH, T4TOTAL, FREET4, T3FREE, THYROIDAB in the last 72 hours. Anemia Panel: No results for input(s): VITAMINB12, FOLATE, FERRITIN, TIBC, IRON, RETICCTPCT in the last 72 hours. Sepsis Labs: Recent Labs  Lab 09/21/20 2023  LATICACIDVEN 1.2    Recent Results (from the past 240 hour(s))  Urine culture     Status: None   Collection Time: 09/19/20  6:10 PM   Specimen: Urine, Clean Catch  Result Value Ref Range Status   Specimen Description   Final    URINE, CLEAN CATCH Performed at [facility], [place] [address] [place], [place] [address]    Special Requests   Final    URINE, CLEAN CATCH Performed at [facility], [place] [address], [place], [place] [address]    Culture   Final    NO GROWTH Performed at [facility], [place] [address], [place], [place] [address]    Report Status 09/21/2020 FINAL  Final  Culture, blood (Routine x 2)     Status: Abnormal   Collection Time: 09/21/20 11:26 AM   Specimen: BLOOD  Result Value Ref Range Status   Specimen Description   Final    BLOOD BLOOD LEFT FOREARM Performed at [place] [address] [place],  [address]    Special Requests   Final    BOTTLES DRAWN AEROBIC AND ANAEROBIC Blood Culture results may not be optimal due to an inadequate volume of blood received in culture bottles Performed at [place] [address] [place], [place] [address]    Culture  Setup Time   Final    GRAM NEGATIVE  RODS IN BOTH AEROBIC AND ANAEROBIC BOTTLES CRITICAL RESULT CALLED TO, READ BACK BY AND VERIFIED WITH: PHARMD NICK G.  [phone] FCP Performed at [place] [address] [place], [place] [address]    Culture ENTEROBACTER AEROGENES (A)  Final   Report Status 09/24/2020 FINAL  Final   Organism ID, Bacteria ENTEROBACTER AEROGENES  Final      Susceptibility   Enterobacter aerogenes - MIC*    CEFAZOLIN RESISTANT Resistant     CEFEPIME <=0.12 SENSITIVE Sensitive     CEFTAZIDIME <=1 SENSITIVE Sensitive     CEFTRIAXONE <=0.25 SENSITIVE Sensitive     CIPROFLOXACIN <=0.25 SENSITIVE Sensitive     GENTAMICIN <=1 SENSITIVE Sensitive     IMIPENEM <=0.25 SENSITIVE Sensitive     TRIMETH/SULFA <=20 SENSITIVE Sensitive     PIP/TAZO <=4 SENSITIVE Sensitive     * ENTEROBACTER AEROGENES  Blood Culture ID Panel (Reflexed)     Status: Abnormal   Collection Time: 09/21/20 11:26 AM  Result Value Ref Range Status   Enterococcus faecalis NOT DETECTED NOT DETECTED Final   Enterococcus Faecium NOT DETECTED NOT DETECTED Final   Listeria monocytogenes NOT DETECTED NOT DETECTED Final   Staphylococcus species NOT DETECTED NOT DETECTED Final   Staphylococcus aureus (BCID) NOT DETECTED NOT DETECTED Final   Staphylococcus epidermidis NOT DETECTED NOT DETECTED Final   Staphylococcus lugdunensis NOT DETECTED NOT DETECTED Final   Streptococcus species NOT DETECTED NOT DETECTED Final   Streptococcus agalactiae NOT DETECTED NOT DETECTED Final   Streptococcus pneumoniae NOT DETECTED NOT DETECTED Final   Streptococcus pyogenes NOT DETECTED NOT DETECTED Final   A.calcoaceticus-baumannii NOT DETECTED NOT DETECTED Final   Bacteroides fragilis NOT DETECTED NOT DETECTED Final   Enterobacterales DETECTED (A) NOT DETECTED Final    Comment: Enterobacterales represent a large order of gram negative bacteria, not a single organism. CRITICAL RESULT CALLED TO, READ BACK BY AND VERIFIED WITH: PHARMD NICK G. [phone]  FCP    Enterobacter cloacae complex NOT DETECTED NOT DETECTED Final   Escherichia coli NOT DETECTED NOT DETECTED Final   Klebsiella aerogenes DETECTED (A) NOT DETECTED Final    Comment: CRITICAL RESULT CALLED TO, READ BACK BY AND VERIFIED WITH: PHARMD NICK G. [phone] FCP    Klebsiella oxytoca NOT DETECTED NOT DETECTED Final   Klebsiella pneumoniae NOT DETECTED NOT DETECTED Final   Proteus species NOT DETECTED NOT DETECTED Final   Salmonella species NOT DETECTED NOT DETECTED Final   Serratia marcescens NOT DETECTED NOT DETECTED Final   Haemophilus influenzae NOT DETECTED NOT DETECTED Final   Neisseria meningitidis NOT DETECTED NOT DETECTED Final   Pseudomonas aeruginosa NOT DETECTED NOT DETECTED Final   Stenotrophomonas maltophilia NOT DETECTED NOT DETECTED Final   Candida albicans NOT DETECTED NOT DETECTED Final   Candida auris NOT DETECTED NOT DETECTED Final   Candida glabrata NOT DETECTED NOT DETECTED Final   Candida krusei NOT DETECTED NOT DETECTED Final   Candida parapsilosis NOT DETECTED NOT DETECTED Final   Candida tropicalis NOT DETECTED NOT DETECTED Final   Cryptococcus neoformans/gattii NOT DETECTED NOT DETECTED Final   CTX-M ESBL NOT DETECTED NOT DETECTED Final   Carbapenem resistance IMP NOT DETECTED NOT DETECTED Final   Carbapenem resistance KPC NOT DETECTED NOT DETECTED Final   Carbapenem resistance NDM NOT DETECTED NOT DETECTED Final   Carbapenem resist OXA 48 LIKE NOT DETECTED NOT DETECTED Final   Carbapenem resistance VIM NOT DETECTED NOT DETECTED Final    Comment: Performed at [facility], [address] [place], [place] [address]  Resp Panel by RT-PCR (Flu A&B, Covid) Nasopharyngeal Swab  Status: None   Collection Time: 09/21/20 11:33 AM   Specimen: Nasopharyngeal Swab; Nasopharyngeal(NP) swabs in vial transport medium  Result Value Ref Range Status   SARS Coronavirus 2 by RT PCR NEGATIVE NEGATIVE Final    Comment: (NOTE) SARS-CoV-2 target nucleic  acids are NOT DETECTED.  The SARS-CoV-2 RNA is generally detectable in upper respiratory specimens during the acute phase of infection. The lowest concentration of SARS-CoV-2 viral copies this assay can detect is 138 copies/mL. A negative result does not preclude SARS-Cov-2 infection and should not be used as the sole basis for treatment or other patient management decisions. A negative result may occur with  improper specimen collection/handling, submission of specimen other than nasopharyngeal swab, presence of viral mutation(s) within the areas targeted by this assay, and inadequate number of viral copies(<138 copies/mL). A negative result must be combined with clinical observations, patient history, and epidemiological information. The expected result is Negative.  Fact Sheet for Patients:  EntrepreneurPulse.com.au  Fact Sheet for Healthcare Providers:  IncredibleEmployment.be  This test is no t yet approved or cleared by the [place] FDA and  has been authorized for detection and/or diagnosis of SARS-CoV-2 by FDA under an Emergency Use Authorization (EUA). This EUA will remain  in effect (meaning this test can be used) for the duration of the COVID-19 declaration under Section 564(b)(1) of the Act, 21 U.S.C.section 360bbb-3(b)(1), unless the authorization is terminated  or revoked sooner.       Influenza A by PCR NEGATIVE NEGATIVE Final   Influenza B by PCR NEGATIVE NEGATIVE Final    Comment: (NOTE) The Xpert Xpress SARS-CoV-2/FLU/RSV plus assay is intended as an aid in the diagnosis of influenza from Nasopharyngeal swab specimens and should not be used as a sole basis for treatment. Nasal washings and aspirates are unacceptable for Xpert Xpress SARS-CoV-2/FLU/RSV testing.  Fact Sheet for Patients: EntrepreneurPulse.com.au  Fact Sheet for Healthcare Providers: IncredibleEmployment.be  This  test is not yet approved or cleared by the [place] FDA and has been authorized for detection and/or diagnosis of SARS-CoV-2 by FDA under an Emergency Use Authorization (EUA). This EUA will remain in effect (meaning this test can be used) for the duration of the COVID-19 declaration under Section 564(b)(1) of the Act, 21 U.S.C. section 360bbb-3(b)(1), unless the authorization is terminated or revoked.  Performed at [facility], [place] [address] [place], [place] [address]   Culture, blood (Routine x 2)     Status: Abnormal   Collection Time: 09/21/20 11:42 AM   Specimen: BLOOD  Result Value Ref Range Status   Specimen Description   Final    BLOOD PORTA CATH Performed at [place] [address], [place], [place] [address]    Special Requests   Final    BOTTLES DRAWN AEROBIC AND ANAEROBIC Blood Culture adequate volume Performed at [place] [address] [place], [place] [address]    Culture  Setup Time   Final    GRAM NEGATIVE RODS IN BOTH AEROBIC AND ANAEROBIC BOTTLES    Culture (A)  Final    ENTEROBACTER AEROGENES SUSCEPTIBILITIES PERFORMED ON PREVIOUS CULTURE WITHIN THE LAST 5 DAYS. Performed at [facility], [place] [address], [place], [place] [address]    Report Status 09/24/2020 FINAL  Final  Urine culture     Status: None   Collection Time: 09/21/20  2:45 PM   Specimen: In/Out Cath Urine  Result Value Ref Range Status   Specimen Description  Final    IN/OUT CATH URINE Performed at [facility], [place] [address] [place], [place] [address]    Special Requests   Final    NONE Performed at [facility], [place] [address] [place], [place] [address]    Culture   Final    NO GROWTH Performed at [facility], [place] [address], [place], [place] [address]    Report Status 09/23/2020 FINAL  Final  Body fluid culture w Gram Stain     Status: None   Collection  Time: 09/21/20  3:15 PM   Specimen: BILE; Body Fluid  Result Value Ref Range Status   Specimen Description   Final    BILE Performed at [place] [address] [place], [place] [address]    Special Requests   Final    Immunocompromised Performed at [facility], [place] [address], [place], [place] [address]    Gram Stain   Final    NO WBC SEEN RARE GRAM NEGATIVE RODS Performed at [facility], [place] [address], [place], [place] [address]    Culture   Final    RARE ENTEROBACTER AEROGENES MODERATE CANDIDA ALBICANS    Report Status 09/25/2020 FINAL  Final   Organism ID, Bacteria ENTEROBACTER AEROGENES  Final      Susceptibility   Enterobacter aerogenes - MIC*    CEFAZOLIN RESISTANT Resistant     CEFEPIME <=0.12 SENSITIVE Sensitive     CEFTAZIDIME <=1 SENSITIVE Sensitive     CEFTRIAXONE <=0.25 SENSITIVE Sensitive     CIPROFLOXACIN <=0.25 SENSITIVE Sensitive     GENTAMICIN <=1 SENSITIVE Sensitive     IMIPENEM <=0.25 SENSITIVE Sensitive     TRIMETH/SULFA <=20 SENSITIVE Sensitive     PIP/TAZO <=4 SENSITIVE Sensitive     * RARE ENTEROBACTER AEROGENES  MRSA PCR Screening     Status: None   Collection Time: 09/21/20  4:44 PM  Result Value Ref Range Status   MRSA by PCR NEGATIVE NEGATIVE Final    Comment:        The GeneXpert MRSA Assay (FDA approved for NASAL specimens only), is one component of a comprehensive MRSA colonization surveillance program. It is not intended to diagnose MRSA infection nor to guide or monitor treatment for MRSA infections. Performed at [facility], [place] [address] [place], [place] [address]      Radiology Studies: No results found.  Scheduled Meds:  chlorhexidine  15 mL Mouth Rinse BID   Chlorhexidine Gluconate Cloth  6 each Topical Daily   dorzolamide-timolol  1 drop Both Eyes BID   fluconazole  400 mg Oral Daily   latanoprost  1 drop Both Eyes QHS   mouth rinse  15 mL Mouth  Rinse q12n4p   Ensure Max Protein  11 oz Oral TID BM   sodium chloride flush  10-40 mL Intracatheter Q12H   sodium chloride flush  5 mL Intracatheter Q8H   Continuous Infusions:  sodium chloride 125 mL/hr at 09/28/20 1358   ceFEPime (MAXIPIME) IV 2 g (09/28/20 1314)     LOS: 7 days    Time spent: 25 mins    Scherrie Seneca, MD [facility]   If 7PM-7AM, please contact night-coverage

## 2020-09-28 NOTE — TOC Progression Note (Signed)
Transition of Care [facility]) - Progression Note    Patient Details  Name: Jonathan Lewis MRN: [id number] Date of Birth: [birth year]  Transition [facility] CM/SW Contact  Lennart Pall, LCSW Phone Number: 09/28/2020, 12:24 PM  Clinical Narrative:    Met with pt, wife and grandson today to discuss possible dc needs.  All confirming plan is for pt to dc home with wife who can provide 24/7 supervision/ light assist.  Pt doing well with PT/OT on evals and appears supervision level overall.  No needs currently. TOC will continue to be available.   Expected Discharge Plan: Home w Hospice Care Barriers to Discharge: Continued Medical Work up  Expected Discharge Plan and Services Expected Discharge Plan: [place]   Discharge Planning Services: CM Consult   Living arrangements for the past 2 months: Single Family Home Expected Discharge Date:  (unknown)                                     Social Determinants of Health (SDOH) Interventions    Readmission Risk Interventions Readmission Risk Prevention Plan 03/20/2019  Transportation Screening Complete  PCP or Specialist Appt within 5-7 Days Complete  Home Care Screening Complete  Medication Review (RN CM) Complete  Some recent data might be hidden

## 2020-09-29 ENCOUNTER — Inpatient Hospital Stay (HOSPITAL_COMMUNITY)
Admission: EM | Admit: 2020-09-29 | Discharge: 2020-09-29 | Disposition: A | Discharge status: Home / Self Care | Payer: Medicare PPO | Source: Home / Self Care | Attending: Student | Admitting: Student

## 2020-09-29 ENCOUNTER — Ambulatory Visit
Admission: RE | Admit: 2020-09-29 | Discharge: 2020-09-29 | Disposition: A | Discharge status: Home / Self Care | Payer: Medicare PPO | Source: Ambulatory Visit | Attending: Radiation Oncology | Admitting: Radiation Oncology

## 2020-09-29 DIAGNOSIS — K831 Obstruction of bile duct: Secondary | ICD-10-CM

## 2020-09-29 HISTORY — PX: IR CHOLANGIOGRAM EXISTING TUBE: IMG6040

## 2020-09-29 LAB — CBC
HCT: 25.8 % — ABNORMAL LOW (ref 39.0–52.0)
Hemoglobin: 8.5 g/dL — ABNORMAL LOW (ref 13.0–17.0)
MCH: 28.5 pg (ref 26.0–34.0)
MCHC: 32.9 g/dL (ref 30.0–36.0)
MCV: 86.6 fL (ref 80.0–100.0)
Platelets: 267 10*3/uL (ref 150–400)
RBC: 2.98 MIL/uL — ABNORMAL LOW (ref 4.22–5.81)
RDW: 14 % (ref 11.5–15.5)
WBC: 3.2 10*3/uL — ABNORMAL LOW (ref 4.0–10.5)
nRBC: 0 % (ref 0.0–0.2)

## 2020-09-29 LAB — COMPREHENSIVE METABOLIC PANEL
ALT: 31 U/L (ref 0–44)
AST: 28 U/L (ref 15–41)
Albumin: 2.3 g/dL — ABNORMAL LOW (ref 3.5–5.0)
Alkaline Phosphatase: 216 U/L — ABNORMAL HIGH (ref 38–126)
Anion gap: 6 (ref 5–15)
BUN: 7 mg/dL — ABNORMAL LOW (ref 8–23)
CO2: 26 mmol/L (ref 22–32)
Calcium: 8.2 mg/dL — ABNORMAL LOW (ref 8.9–10.3)
Chloride: 107 mmol/L (ref 98–111)
Creatinine, Ser: 0.61 mg/dL (ref 0.61–1.24)
GFR, Estimated: >60 mL/min (ref >60)
Glucose, Bld: 100 mg/dL — ABNORMAL HIGH (ref 70–99)
Potassium: 3.6 mmol/L (ref 3.5–5.1)
Sodium: 139 mmol/L (ref 135–145)
Total Bilirubin: 1.7 mg/dL — ABNORMAL HIGH (ref 0.3–1.2)
Total Protein: 5.5 g/dL — ABNORMAL LOW (ref 6.5–8.1)

## 2020-09-29 LAB — PHOSPHORUS: Phosphorus: 2.6 mg/dL (ref 2.5–4.6)

## 2020-09-29 LAB — GLUCOSE, CAPILLARY
Glucose-Capillary: 95 mg/dL (ref 70–99)
Glucose-Capillary: 101 mg/dL — ABNORMAL HIGH (ref 70–99)
Glucose-Capillary: 74 mg/dL (ref 70–99)

## 2020-09-29 LAB — MAGNESIUM: Magnesium: 1.9 mg/dL (ref 1.7–2.4)

## 2020-09-29 MED ORDER — IOHEXOL 300 MG/ML  SOLN
15.0000 mL | Freq: Once | INTRAMUSCULAR | Status: AC | PRN
  Administered 2020-09-29: 10 mL

## 2020-09-29 MED ORDER — IOHEXOL 300 MG/ML  SOLN
10.0000 mL | Freq: Once | INTRAMUSCULAR | Status: AC | PRN
  Administered 2020-09-29: 10 mL

## 2020-09-29 NOTE — Plan of Care (Signed)

## 2020-09-29 NOTE — Procedures (Signed)
Pre procedural Dx: Metastatic gastric cancer.  Post procedural Dx: Same  Appropriately positioned and functioning left-sided biliary drainage catheter.   Appropriately positioned and functioning cholecystostomy tube with occlusion of the mid aspect of the cystic duct.  PLAN:  - The patient's biliary drainage catheter was capped for a trial of internalization.   - The patient should have daily CMPs while admitted to the hospital to insure continued near normalization of the LFTs.  If their is recurrent elevation of his LFTs and/or the patient experiences excessive pericatheter drainage, the biliary drainage catheter may be reconnected to a gravity bag as indicated.  - Given occlusion of the cystic duct, the cholecystostomy tube should remain to external drainage.  - Neither drainage catheter requires routine flushing at this time.  - The patient should return for exchange of both catheters in approximately 6 weeks (week of August 1st).  Note, consideration for internal biliary stent placement will be determined following next post treatment staging CT.   Ronny Bacon, MD Pager #: [phone]

## 2020-09-29 NOTE — Progress Notes (Signed)
Referring Physician(s): Kumar,P/Ennever,P  Supervising Physician: Sandi Mariscal  Patient Status:  [facility] - In-pt  Chief Complaint: Gastric cancer, obstructive jaundice, cholecystitis/bacteremia   Subjective: Pt sitting up in chair; resting comfortably; denies worsening abd pain,N/V; tolerating diet ok   Allergies: Patient has no known allergies.  Medications: Prior to Admission medications   Medication Sig Start Date End Date Taking? Authorizing Provider  acetaminophen (TYLENOL) 160 MG/5ML liquid Take 500 mg by mouth every 4 (four) hours as needed for fever.   Yes [provider]  atorvastatin (LIPITOR) 20 MG tablet Take 1 tablet (20 mg total) by mouth daily. Patient taking differently: Take 20 mg by mouth in the morning. 04/26/20 04/26/21 Yes Biagio Borg, MD  cephALEXin (KEFLEX) 500 MG capsule Take 1 capsule (500 mg total) by mouth 4 (four) times daily. Patient taking differently: Take 500 mg by mouth 4 (four) times daily. Start date : 09/20/20 09/19/20  Yes Sherrill Raring, PA-C  dorzolamide-timolol (COSOPT) 22.3-6.8 MG/ML ophthalmic solution Place 1 drop into both eyes 2 (two) times daily. 06/15/20  Yes [provider]  famotidine (PEPCID) 20 MG tablet TAKE 1 TABLET BY MOUTH EVERY DAY Patient taking differently: Take 20 mg by mouth daily. 09/10/20  Yes Ennever, Rudell Cobb, MD  latanoprost (XALATAN) 0.005 % ophthalmic solution Place 1 drop into both eyes at bedtime.  01/09/19  Yes [provider]  lidocaine-prilocaine (EMLA) cream Apply 1 application topically as needed. Place on the port one hour before appointment. Patient taking differently: Apply 1 application topically daily as needed (port access). Place on the port one hour before appointment. 08/24/20  Yes Volanda Napoleon, MD  meclizine (ANTIVERT) 12.5 MG tablet TAKE 1 TABLET BY MOUTH 3 TIMES DAILY AS NEEDED FOR DIZZINESS. Patient taking differently: Take 12.5 mg by mouth daily as needed for dizziness.  08/10/20  Yes Biagio Borg, MD  Multiple Vitamin (MULTIVITAMIN WITH MINERALS) TABS tablet Take 1 tablet by mouth in the morning. Men's One-A-Day Chewable   Yes [provider]  ondansetron (ZOFRAN) 8 MG tablet Take 1 tablet (8 mg total) by mouth every 8 (eight) hours as needed for nausea or vomiting. 09/16/20  Yes Hayden Pedro, PA-C  senna-docusate (SENOKOT S) 8.6-50 MG tablet Take 1 tablet by mouth daily. 09/12/20  Yes Domenic Polite, MD  SYSTANE ULTRA 0.4-0.3 % SOLN Place 1 drop into both eyes 3 (three) times daily as needed (dry eyes). 01/09/19  Yes [provider]  traMADol (ULTRAM) 50 MG tablet Take 2 tablets (100 mg total) by mouth at bedtime as needed for moderate pain or severe pain. 04/22/20  Yes Biagio Borg, MD     Vital Signs: BP 137/87   Pulse 60   Temp 98 F (36.7 C) (Oral)   Resp 18   Ht 5\' 8"  (1.727 m)   Wt 209 lb 7 oz (95 kg)   SpO2 100%   BMI 31.84 kg/m   Physical Exam awake, alert.  Gallbladder and biliary drains intact, insertion sites okay, not significantly tender, output from biliary drain 135 cc green bile, gallbladder drain 570 cc of slightly blood-tinged bile; both drains irrigated without difficulty  Imaging: No results found.  Labs:  CBC: Recent Labs    09/25/20 0500 09/26/20 0403 09/27/20 0500 09/29/20 0432  WBC 5.7 4.2 3.6* 3.2*  HGB 8.7* 8.7* 8.2* 8.5*  HCT 26.0* 26.0* 25.4* 25.8*  PLT 166 213 241 267    COAGS: Recent Labs    01/06/20 0915  09/09/20 1900 09/21/20 1126 09/22/20 0414 09/24/20 0620  INR 1.0 1.0 1.6* 1.8* 1.5*  APTT 29  --  35  --   --     BMP: Recent Labs    10/30/19 0900 11/20/19 0900 12/11/19 1000 12/31/19 1056 02/18/20 1025 09/25/20 0500 09/26/20 0403 09/27/20 0500 09/29/20 0432  NA 141 139 141 140   < > 134* 134* 137 139  K 3.5 4.0 4.0 4.0   < > 3.8 3.8 3.8 3.6  CL 108 104 107 106   < > 108 108 109 107  CO2 27 30 29 28    < > 22 23 23 26   GLUCOSE 173* 103* 105* 127*   < > 95  125* 96 100*  BUN 12 18 13 16    < > 10 9 9  7*  CALCIUM 9.2 9.8 9.4 9.4   < > 7.6* 7.7* 7.8* 8.2*  CREATININE 0.76 0.90 0.83 0.87   < > 0.67 0.62 0.63 0.61  GFRNONAA >60 >60 >60 >60   < > >60 >60 >60 >60  GFRAA >60 >60 >60 >60  --   --   --   --   --    < > = values in this interval not displayed.    LIVER FUNCTION TESTS: Recent Labs    09/25/20 0500 09/26/20 0403 09/27/20 0500 09/29/20 0432  BILITOT 2.2* 2.0* 1.6* 1.7*  AST 41 30 27 28   ALT 44 38 33 31  ALKPHOS 237* 247* 240* 216*  PROT 4.9* 5.2* 5.1* 5.5*  ALBUMIN 1.9* 2.0* 2.0* 2.3*    Assessment and Plan: 80 y.o. male with history of stage III gastric cancer s/p distal gastrostomy in 03/2019, obstructive jaundice status post failed attempt at ERCP biliary stent placement on 09/09/20, s/p percutaneous biliary drainage placement, with IR on 6/9; s/p exchange/upsize bili drain and perc GB drain placement with Dr. Pascal Lux on 6/23; afebrile, WBC 3.2 down from 3.6, hemoglobin 8.5 up from 8, creatinine normal, total bilirubin 1.7 up slightly from 1.6, bile cultures with Enterobacter; patient tentatively scheduled for follow-up cholangiogram with possible capping trial of biliary drain on 7/1; GB drain will need to remain in place 4-6 weeks to allow for tract maturation before removing   Electronically Signed: D. Rowe Robert, PA-C 09/29/2020, 11:12 AM   I spent a total of 15 Minutes at the the patient's bedside AND on the patient's hospital floor or unit, greater than 50% of which was counseling/coordinating care for gallbladder and biliary drains    [name], male   DOB: [birth year], 80 y.o.   MRN: [id number]

## 2020-09-29 NOTE — Care Management Important Message (Signed)
Important Message  Patient Details IM Letter given to the Patient. Name: Jonathan Lewis MRN: [id number] Date of Birth: [birth year]   Medicare Important Message Given:  Yes     Kerin Salen 09/29/2020, 8:45 AM

## 2020-09-29 NOTE — Progress Notes (Signed)
PROGRESS NOTE    Jonathan Lewis  RDE:[id number] DOB: 07-18-1940 DOA: 09/21/2020 PCP: Biagio Borg, MD    Brief Narrative:  This 80 years old male with PMH significant for metastatic adenocarcinoma of the stomach, s/p Billroth II, DM, HTN who presented in the ED with generalized weakness, fatigue, less interactive,  not getting out of bed.  Patient is found to have biliary obstruction secondary to metastatic cancer.  Biliary stent was attempted  but failed so IR percutaneous drain was placed.  Of note patient presented 2 days ago for urinary catheter exchange , found to be febrile and was prescribed empirical Keflex for UTI.  Urine cultures no growth. Patient is admitted for severe sepsis / septic shock requiring Levophed secondary to Klebsiella bacteremia. Source is still considered biliary.  Infectious disease consulted,  started on IV antibiotics.  Patient underwent percutaneous drain which is draining well.  Liver enzymes trending down. Levophed discontinued.  Patient underwent cholecystostomy tube.  Assessment & Plan:   Principal Problem:   SIRS (systemic inflammatory response syndrome) (HCC) Active Problems:   Diabetes (HCC)   Benign nodular prostatic hyperplasia   Gastric cancer pT3pN2 s/p distal gastrectomy with Billroth II gastrojejunostomy 03/19/2019   Sepsis ([place])   Severe sepsis / Septic shock secondary to Klebsiella bacteremia: Patient presented with hypotension, lactic acid 3.5 and altered mentation. He briefly required Levophed which was discontinued on 6/21. Blood cultures positive for Klebsiella aerogenes bacteremia. Infectious disease consulted , thinks likely source is biliary. UA is clean, no urinary symptoms. Port-A-Cath is in place, no redness, no swelling , or pain. De-escalate antibiotics to cefepime.   Sepsis physiology improved.  Lactic acid normalized. ID recommended continue cefepime while inpatient, can be de-escalated to Bactrim at discharge. Total  duration of antibiotic 14 days.  Acute metabolic encephalopathy : Resolved. This could be multifactorial related to hypotension and sepsis CT head unremarkable.  Encephalopathy resolved.  Biliary obstruction secondary to metastatic stomach cancer: S/p percutaneous drain by IR.  Draining well.   Patient is status post cholecystostomy tube placement and upsizing of biliary drain on 6/23 Patient underwent capping trial for biliary drain on 09/29/20. Bilirubin and LFTs are trending down.  Daily CMP to ensure near normalization of LFTs Due to complete occlusion of cystic duct the cholecystostomy tube should remain to external drainage. Patient should return for exchange of both catheters in approximately 6 weeks. IR recommended checking CMP tomorrow before discharge.  Urinary obstruction: Continue Foley catheter.   Hyponatremia: > Improved. Could be likely secondary to dehydration given decreased p.o. intake.   Normocytic normochromic anemia: Baseline hemoglobin 10-11. Continue to monitor.  No obvious bleeding. Hb remains stable.   Adenocarcinoma of the stomach: Oncology is following.  Continue radiation therapy.   Type 2 diabetes: Continue regular insulin sliding scale.     DVT prophylaxis: SCDs Code Status: DNR Family Communication: No family at bed side. Disposition Plan:   Status is: Inpatient  Remains inpatient appropriate because:Inpatient level of care appropriate due to severity of illness  Dispo: The patient is from: Home              Anticipated d/c is to:  Home with Home PT 6/29              Patient currently is not medically stable to d/c.   Difficult to place patient No  Consultants:  GI IR  Procedures: Percutaneous biliary drain. Antimicrobials:   Anti-infectives (From admission, onward)    Start  Dose/Rate Route Frequency Ordered Stop   09/24/20 1400  fluconazole (DIFLUCAN) tablet 400 mg        400 mg Oral Daily 09/23/20 1655 09/29/20 1417    09/23/20 1830  fluconazole (DIFLUCAN) IVPB 400 mg        400 mg 100 mL/hr over 120 Minutes Intravenous Every 2 hours 09/23/20 1821 09/23/20 2322   09/23/20 1800  fluconazole (DIFLUCAN) IVPB 800 mg  Status:  Discontinued        800 mg 200 mL/hr over 120 Minutes Intravenous  Once 09/23/20 1655 09/23/20 1821   09/22/20 1300  vancomycin (VANCOREADY) IVPB 1500 mg/300 mL  Status:  Discontinued        1,500 mg 150 mL/hr over 120 Minutes Intravenous Every 24 hours 09/21/20 1606 09/22/20 0858   09/21/20 2100  ceFEPIme (MAXIPIME) 2 g in sodium chloride 0.9 % 100 mL IVPB        2 g 200 mL/hr over 30 Minutes Intravenous Every 8 hours 09/21/20 1606     09/21/20 2000  metroNIDAZOLE (FLAGYL) IVPB 500 mg  Status:  Discontinued        500 mg 100 mL/hr over 60 Minutes Intravenous Every 8 hours 09/21/20 1553 09/22/20 0858   09/21/20 1300  vancomycin (VANCOREADY) IVPB 1500 mg/300 mL        1,500 mg 150 mL/hr over 120 Minutes Intravenous  Once 09/21/20 1155 09/21/20 1514   09/21/20 1200  ceFEPIme (MAXIPIME) 2 g in sodium chloride 0.9 % 100 mL IVPB        2 g 200 mL/hr over 30 Minutes Intravenous  Once 09/21/20 1155 09/21/20 1315   09/21/20 1145  metroNIDAZOLE (FLAGYL) IVPB 500 mg        500 mg 100 mL/hr over 60 Minutes Intravenous  Once 09/21/20 1133 09/21/20 1315        Subjective: Patient was seen and examined at bedside.  Overnight events noted.   He underwent capping trial for the biliary drain on 6/28.  Tolerated well. Patient went for radiation treatment.  Patient wants to be discharged.   Objective: Vitals:   09/28/20 1501 09/28/20 2021 09/29/20 0446 09/29/20 1339  BP: (!) 160/82 135/86 137/87 (!) 158/88  Pulse: (!) 59 68 60 (!) 54  Resp: 20 18 18 16   Temp: 98.3 F (36.8 C) 99 F (37.2 C) 98 F (36.7 C) 98.4 F (36.9 C)  TempSrc:  Oral Oral   SpO2: 100% 100% 100% 100%  Weight:      Height:        Intake/Output Summary (Last 24 hours) at 09/29/2020 1535 Last data filed at  09/29/2020 1200 Gross per 24 hour  Intake 848 ml  Output 3150 ml  Net -2302 ml   Filed Weights   09/24/20 0725  Weight: 95 kg    Examination:  General exam: Appears calm and comfortable, not in any acute distress. Appears cheerful. Respiratory system: Clear to auscultation. Respiratory effort normal. Cardiovascular system: S1 & S2 heard, RRR. No JVD, murmurs, rubs, gallops or clicks. No pedal edema. Gastrointestinal system: Abdomen is nondistended, soft and nontender. No organomegaly or masses felt. Normal bowel sounds heard.   Biliary drain  x 2  noted.  Central nervous system: Alert and oriented. No focal neurological deficits. Extremities: No edema, no cyanosis, no clubbing. Skin: No rashes, lesions or ulcers Psychiatry: Judgement and insight appear normal. Mood & affect appropriate.     Data Reviewed: I have personally reviewed following labs and imaging studies  CBC:  Recent Labs  Lab 09/24/20 0620 09/25/20 0500 09/26/20 0403 09/27/20 0500 09/29/20 0432  WBC 6.1 5.7 4.2 3.6* 3.2*  HGB 8.7* 8.7* 8.7* 8.2* 8.5*  HCT 26.3* 26.0* 26.0* 25.4* 25.8*  MCV 84.0 85.8 85.8 86.4 86.6  PLT 133* 166 213 241 983   Basic Metabolic Panel: Recent Labs  Lab 09/24/20 0620 09/25/20 0500 09/26/20 0403 09/27/20 0500 09/29/20 0432  NA 131* 134* 134* 137 139  K 3.7 3.8 3.8 3.8 3.6  CL 105 108 108 109 107  CO2 20* 22 23 23 26   GLUCOSE 93 95 125* 96 100*  BUN 12 10 9 9  7*  CREATININE 0.64 0.67 0.62 0.63 0.61  CALCIUM 7.5* 7.6* 7.7* 7.8* 8.2*  MG  --   --   --   --  1.9  PHOS  --   --   --   --  2.6   GFR: Estimated Creatinine Clearance: 80.9 mL/min (by C-G formula based on SCr of 0.61 mg/dL). Liver Function Tests: Recent Labs  Lab 09/24/20 0620 09/25/20 0500 09/26/20 0403 09/27/20 0500 09/29/20 0432  AST 60* 41 30 27 28   ALT 55* 44 38 33 31  ALKPHOS 246* 237* 247* 240* 216*  BILITOT 2.4* 2.2* 2.0* 1.6* 1.7*  PROT 5.2* 4.9* 5.2* 5.1* 5.5*  ALBUMIN 2.0* 1.9* 2.0*  2.0* 2.3*   No results for input(s): LIPASE, AMYLASE in the last 168 hours. No results for input(s): AMMONIA in the last 168 hours. Coagulation Profile: Recent Labs  Lab 09/24/20 0620  INR 1.5*   Cardiac Enzymes: No results for input(s): CKTOTAL, CKMB, CKMBINDEX, TROPONINI in the last 168 hours. BNP (last 3 results) No results for input(s): PROBNP in the last 8760 hours. HbA1C: No results for input(s): HGBA1C in the last 72 hours. CBG: Recent Labs  Lab 09/27/20 0737 09/28/20 0751 09/28/20 1725 09/29/20 0740 09/29/20 1135  GLUCAP 105* 98 83 95 101*   Lipid Profile: No results for input(s): CHOL, HDL, LDLCALC, TRIG, CHOLHDL, LDLDIRECT in the last 72 hours. Thyroid Function Tests: No results for input(s): TSH, T4TOTAL, FREET4, T3FREE, THYROIDAB in the last 72 hours. Anemia Panel: No results for input(s): VITAMINB12, FOLATE, FERRITIN, TIBC, IRON, RETICCTPCT in the last 72 hours. Sepsis Labs: No results for input(s): PROCALCITON, LATICACIDVEN in the last 168 hours.   Recent Results (from the past 240 hour(s))  Urine culture     Status: None   Collection Time: 09/19/20  6:10 PM   Specimen: Urine, Clean Catch  Result Value Ref Range Status   Specimen Description   Final    URINE, CLEAN CATCH Performed at [facility], [place] [address], [place], [place] [address]    Special Requests   Final    URINE, CLEAN CATCH Performed at [facility], [place] [address], [place], [place] [address]    Culture   Final    NO GROWTH Performed at [facility], [place] [address] [place], [place] [address]    Report Status 09/21/2020 FINAL  Final  Culture, blood (Routine x 2)     Status: Abnormal   Collection Time: 09/21/20 11:26 AM   Specimen: BLOOD  Result Value Ref Range Status   Specimen Description   Final    BLOOD BLOOD LEFT FOREARM Performed at [place] [address], [place], [place] [address]    Special Requests    Final    BOTTLES DRAWN AEROBIC AND ANAEROBIC Blood Culture results may not be optimal  due to an inadequate volume of blood received in culture bottles Performed at [place] [address] [place], [place] [address]    Culture  Setup Time   Final    GRAM NEGATIVE RODS IN BOTH AEROBIC AND ANAEROBIC BOTTLES CRITICAL RESULT CALLED TO, READ BACK BY AND VERIFIED WITH: PHARMD Joyice Faster [phone] FCP Performed at [facility], [place] [address] [place], [place] [address]    Culture ENTEROBACTER AEROGENES (A)  Final   Report Status 09/24/2020 FINAL  Final   Organism ID, Bacteria ENTEROBACTER AEROGENES  Final      Susceptibility   Enterobacter aerogenes - MIC*    CEFAZOLIN RESISTANT Resistant     CEFEPIME <=0.12 SENSITIVE Sensitive     CEFTAZIDIME <=1 SENSITIVE Sensitive     CEFTRIAXONE <=0.25 SENSITIVE Sensitive     CIPROFLOXACIN <=0.25 SENSITIVE Sensitive     GENTAMICIN <=1 SENSITIVE Sensitive     IMIPENEM <=0.25 SENSITIVE Sensitive     TRIMETH/SULFA <=20 SENSITIVE Sensitive     PIP/TAZO <=4 SENSITIVE Sensitive     * ENTEROBACTER AEROGENES  Blood Culture ID Panel (Reflexed)     Status: Abnormal   Collection Time: 09/21/20 11:26 AM  Result Value Ref Range Status   Enterococcus faecalis NOT DETECTED NOT DETECTED Final   Enterococcus Faecium NOT DETECTED NOT DETECTED Final   Listeria monocytogenes NOT DETECTED NOT DETECTED Final   Staphylococcus species NOT DETECTED NOT DETECTED Final   Staphylococcus aureus (BCID) NOT DETECTED NOT DETECTED Final   Staphylococcus epidermidis NOT DETECTED NOT DETECTED Final   Staphylococcus lugdunensis NOT DETECTED NOT DETECTED Final   Streptococcus species NOT DETECTED NOT DETECTED Final   Streptococcus agalactiae NOT DETECTED NOT DETECTED Final   Streptococcus pneumoniae NOT DETECTED NOT DETECTED Final   Streptococcus pyogenes NOT DETECTED NOT DETECTED Final   A.calcoaceticus-baumannii NOT DETECTED NOT DETECTED Final    Bacteroides fragilis NOT DETECTED NOT DETECTED Final   Enterobacterales DETECTED (A) NOT DETECTED Final    Comment: Enterobacterales represent a large order of gram negative bacteria, not a single organism. CRITICAL RESULT CALLED TO, READ BACK BY AND VERIFIED WITH: PHARMD NICK G. [phone] FCP    Enterobacter cloacae complex NOT DETECTED NOT DETECTED Final   Escherichia coli NOT DETECTED NOT DETECTED Final   Klebsiella aerogenes DETECTED (A) NOT DETECTED Final    Comment: CRITICAL RESULT CALLED TO, READ BACK BY AND VERIFIED WITH: PHARMD NICK G. [phone] FCP    Klebsiella oxytoca NOT DETECTED NOT DETECTED Final   Klebsiella pneumoniae NOT DETECTED NOT DETECTED Final   Proteus species NOT DETECTED NOT DETECTED Final   Salmonella species NOT DETECTED NOT DETECTED Final   Serratia marcescens NOT DETECTED NOT DETECTED Final   Haemophilus influenzae NOT DETECTED NOT DETECTED Final   Neisseria meningitidis NOT DETECTED NOT DETECTED Final   Pseudomonas aeruginosa NOT DETECTED NOT DETECTED Final   Stenotrophomonas maltophilia NOT DETECTED NOT DETECTED Final   Candida albicans NOT DETECTED NOT DETECTED Final   Candida auris NOT DETECTED NOT DETECTED Final   Candida glabrata NOT DETECTED NOT DETECTED Final   Candida krusei NOT DETECTED NOT DETECTED Final   Candida parapsilosis NOT DETECTED NOT DETECTED Final   Candida tropicalis NOT DETECTED NOT DETECTED Final   Cryptococcus neoformans/gattii NOT DETECTED NOT DETECTED Final   CTX-M ESBL NOT DETECTED NOT DETECTED Final   Carbapenem resistance IMP NOT DETECTED NOT DETECTED Final   Carbapenem resistance KPC NOT DETECTED NOT DETECTED Final   Carbapenem  resistance NDM NOT DETECTED NOT DETECTED Final   Carbapenem resist OXA 48 LIKE NOT DETECTED NOT DETECTED Final   Carbapenem resistance VIM NOT DETECTED NOT DETECTED Final    Comment: Performed at [facility], [place] [address] [place], [place] [address]  Resp Panel by RT-PCR (Flu A&B,  Covid) Nasopharyngeal Swab     Status: None   Collection Time: 09/21/20 11:33 AM   Specimen: Nasopharyngeal Swab; Nasopharyngeal(NP) swabs in vial transport medium  Result Value Ref Range Status   SARS Coronavirus 2 by RT PCR NEGATIVE NEGATIVE Final    Comment: (NOTE) SARS-CoV-2 target nucleic acids are NOT DETECTED.  The SARS-CoV-2 RNA is generally detectable in upper respiratory specimens during the acute phase of infection. The lowest concentration of SARS-CoV-2 viral copies this assay can detect is 138 copies/mL. A negative result does not preclude SARS-Cov-2 infection and should not be used as the sole basis for treatment or other patient management decisions. A negative result may occur with  improper specimen collection/handling, submission of specimen other than nasopharyngeal swab, presence of viral mutation(s) within the areas targeted by this assay, and inadequate number of viral copies(<138 copies/mL). A negative result must be combined with clinical observations, patient history, and epidemiological information. The expected result is Negative.  Fact Sheet for Patients:  EntrepreneurPulse.com.au  Fact Sheet for Healthcare Providers:  IncredibleEmployment.be  This test is no t yet approved or cleared by the [place] FDA and  has been authorized for detection and/or diagnosis of SARS-CoV-2 by FDA under an Emergency Use Authorization (EUA). This EUA will remain  in effect (meaning this test can be used) for the duration of the COVID-19 declaration under Section 564(b)(1) of the Act, 21 U.S.C.section 360bbb-3(b)(1), unless the authorization is terminated  or revoked sooner.       Influenza A by PCR NEGATIVE NEGATIVE Final   Influenza B by PCR NEGATIVE NEGATIVE Final    Comment: (NOTE) The Xpert Xpress SARS-CoV-2/FLU/RSV plus assay is intended as an aid in the diagnosis of influenza from Nasopharyngeal swab specimens and should  not be used as a sole basis for treatment. Nasal washings and aspirates are unacceptable for Xpert Xpress SARS-CoV-2/FLU/RSV testing.  Fact Sheet for Patients: EntrepreneurPulse.com.au  Fact Sheet for Healthcare Providers: IncredibleEmployment.be  This test is not yet approved or cleared by the [place] FDA and has been authorized for detection and/or diagnosis of SARS-CoV-2 by FDA under an Emergency Use Authorization (EUA). This EUA will remain in effect (meaning this test can be used) for the duration of the COVID-19 declaration under Section 564(b)(1) of the Act, 21 U.S.C. section 360bbb-3(b)(1), unless the authorization is terminated or revoked.  Performed at [facility], [place] [address], [place], [place] [address]   Culture, blood (Routine x 2)     Status: Abnormal   Collection Time: 09/21/20 11:42 AM   Specimen: BLOOD  Result Value Ref Range Status   Specimen Description   Final    BLOOD PORTA CATH Performed at [place] [address], [place], [place] [address]    Special Requests   Final    BOTTLES DRAWN AEROBIC AND ANAEROBIC Blood Culture adequate volume Performed at [place] [address] [place], [place] [address]    Culture  Setup Time   Final    GRAM NEGATIVE RODS IN BOTH AEROBIC AND ANAEROBIC BOTTLES    Culture (A)  Final    ENTEROBACTER AEROGENES SUSCEPTIBILITIES PERFORMED ON PREVIOUS CULTURE WITHIN  THE LAST 5 DAYS. Performed at [facility], [place] [address], [place], [place] [address]    Report Status 09/24/2020 FINAL  Final  Urine culture     Status: None   Collection Time: 09/21/20  2:45 PM   Specimen: In/Out Cath Urine  Result Value Ref Range Status   Specimen Description   Final    IN/OUT CATH URINE Performed at [place] [address], [place], [place] [address]    Special Requests   Final    NONE Performed  at [facility], [place] [address] [place], [place] [address]    Culture   Final    NO GROWTH Performed at [facility], [place] [address], [place], [place] [address]    Report Status 09/23/2020 FINAL  Final  Body fluid culture w Gram Stain     Status: None   Collection Time: 09/21/20  3:15 PM   Specimen: BILE; Body Fluid  Result Value Ref Range Status   Specimen Description   Final    BILE Performed at [place] [address], [place], [place] [address]    Special Requests   Final    Immunocompromised Performed at [facility], [place] [address], [place], [place] [address]    Gram Stain   Final    NO WBC SEEN RARE GRAM NEGATIVE RODS Performed at [facility], [place] [address], [place], [place] [address]    Culture   Final    RARE ENTEROBACTER AEROGENES MODERATE CANDIDA ALBICANS    Report Status 09/25/2020 FINAL  Final   Organism ID, Bacteria ENTEROBACTER AEROGENES  Final      Susceptibility   Enterobacter aerogenes - MIC*    CEFAZOLIN RESISTANT Resistant     CEFEPIME <=0.12 SENSITIVE Sensitive     CEFTAZIDIME <=1 SENSITIVE Sensitive     CEFTRIAXONE <=0.25 SENSITIVE Sensitive     CIPROFLOXACIN <=0.25 SENSITIVE Sensitive     GENTAMICIN <=1 SENSITIVE Sensitive     IMIPENEM <=0.25 SENSITIVE Sensitive     TRIMETH/SULFA <=20 SENSITIVE Sensitive     PIP/TAZO <=4 SENSITIVE Sensitive     * RARE ENTEROBACTER AEROGENES  MRSA PCR Screening     Status: None   Collection Time: 09/21/20  4:44 PM  Result Value Ref Range Status   MRSA by PCR NEGATIVE NEGATIVE Final    Comment:        The GeneXpert MRSA Assay (FDA approved for NASAL specimens only), is one component of a comprehensive MRSA colonization surveillance program. It is not intended to diagnose MRSA infection nor to guide or monitor treatment for MRSA infections. Performed at [facility], [place] [address] [place], [place] [address]       Radiology Studies: IR CHOLANGIOGRAM EXISTING TUBE  Result Date: 09/29/2020 INDICATION: History of gastric cancer complicated by development obstructive jaundice, post internal/external biliary drainage catheter placement on 09/10/2020 with subsequent fluoroscopic guided exchange and up sizing performed 09/24/2020. Additionally, patient was recently admitted with sepsis and ultimately underwent image guided placement of a cholecystostomy tube also on 09/04/2020. The patient's LFTs have been steadily improving and as such patient presents today for percutaneous cholangiogram via the existing biliary drainage catheter in hopes of initiating a capping trial. Additionally, we will proceed with percutaneous cholangiogram via the cholecystostomy tube to evaluate patency of the cystic duct. EXAM: 1. CHOLANGIOGRAM VIA THE EXISTING PERCUTANEOUS BILIARY DRAINAGE CATHETER 2. CHOLANGIOGRAM VIA THE EXISTING PERCUTANEOUS CHOLECYSTOSTOMY TUBE  COMPARISON:  Image guided percutaneous biliary drainage catheter exchange and up sizing with cholecystostomy tube placement-09/24/2020 Image guided left-sided internal/external biliary drainage catheter placement-09/10/2020 CT abdomen and pelvis-09/21/2020; 09/10/2020 MEDICATIONS: None, the patient is admitted to the hospital and currently receiving intravenous antibiotics; the antibiotic was administered with an appropriate time frame prior to the initiation of the procedure CONTRAST:  A total of 20 cc Omnipaque 300 was administered into the biliary tree and gallbladder. ANESTHESIA/SEDATION: None FLUOROSCOPY TIME:  1 minutes, 42 seconds (36 mGy) COMPLICATIONS: None immediate. TECHNIQUE: Patient was placed supine on the fluoroscopy table Preprocedural spot fluoroscopic image was obtained of the upper abdomen demonstrates unchanged positioning of the left-sided internal/external biliary drainage catheter with end overlying the expected location duodenum in radiopaque marker overlying  expected location biliary hilum. Cholecystostomy tube overlies expected location of gallbladder lumen Contrast injection demonstrates appropriate position functionality of the left-sided internal/external biliary drainage catheter with brisk passage of contrast through the drainage catheter to the level of the duodenum. There is minimal opacification of the central aspect of the right and left intrahepatic biliary tree both of which appear nondilated Contrast injection of the cholecystostomy tube demonstrates appropriate position functionality. There is passage of contrast to the level of the mid aspect of the cystic duct however despite the acquisition of delayed imaging, there is no definitive passage beyond the mid aspect of the cystic duct to the level of the CBD or the internal/external biliary drainage catheter. FINDINGS: Appropriately positioned and functioning left-sided internal/external biliary drainage catheter with brisk passage of contrast through the drainage catheter to the level of the duodenum and opacification of both the right and left intrahepatic biliary tree, both of which appear nondilated. Cholangiogram via the existing cholecystostomy tube demonstrates appropriate positioning and functionality. There is apparent occlusion of the mid aspect of the cystic duct without passage of contrast to the level of the CBD or the internal/external biliary drainage catheter. IMPRESSION: 1. Appropriately positioned and functioning left-sided biliary drainage catheter. 2. Appropriately positioned and functioning cholecystostomy tube with occlusion of the mid aspect of the cystic duct. PLAN: - The patient's biliary drainage catheter was capped for a trial of internalization. - The patient should have daily CMPs while admitted to the hospital to insure continued near normalization of the LFTs. If their is recurrent elevation of his LFTs and/or the patient experiences excessive pericatheter drainage, the  biliary drainage catheter may be reconnected to a gravity bag as indicated. - Given occlusion of the cystic duct, the cholecystostomy tube should remain to external drainage. - Neither drainage catheter requires routine flushing at this time. - The patient should return for exchange of both catheters in approximately 6 weeks (week of August 1st). Note, consideration for internal biliary stent placement will be determined following next post treatment staging CT. Electronically Signed   By: Sandi Mariscal M.D.   On: 09/29/2020 14:42   IR CHOLANGIOGRAM EXISTING TUBE  Result Date: 09/29/2020 INDICATION: History of gastric cancer complicated by development obstructive jaundice, post internal/external biliary drainage catheter placement on 09/10/2020 with subsequent fluoroscopic guided exchange and up sizing performed 09/24/2020. Additionally, patient was recently admitted with sepsis and ultimately underwent image guided placement of a cholecystostomy tube also on 09/04/2020. The patient's LFTs have been steadily improving and as such patient presents today for percutaneous cholangiogram via the existing biliary drainage catheter in hopes of initiating a capping trial. Additionally, we will proceed with percutaneous cholangiogram via the cholecystostomy tube to evaluate patency of the cystic duct. EXAM: 1.  CHOLANGIOGRAM VIA THE EXISTING PERCUTANEOUS BILIARY DRAINAGE CATHETER 2. CHOLANGIOGRAM VIA THE EXISTING PERCUTANEOUS CHOLECYSTOSTOMY TUBE COMPARISON:  Image guided percutaneous biliary drainage catheter exchange and up sizing with cholecystostomy tube placement-09/24/2020 Image guided left-sided internal/external biliary drainage catheter placement-09/10/2020 CT abdomen and pelvis-09/21/2020; 09/10/2020 MEDICATIONS: None, the patient is admitted to the hospital and currently receiving intravenous antibiotics; the antibiotic was administered with an appropriate time frame prior to the initiation of the procedure  CONTRAST:  A total of 20 cc Omnipaque 300 was administered into the biliary tree and gallbladder. ANESTHESIA/SEDATION: None FLUOROSCOPY TIME:  1 minutes, 42 seconds (36 mGy) COMPLICATIONS: None immediate. TECHNIQUE: Patient was placed supine on the fluoroscopy table Preprocedural spot fluoroscopic image was obtained of the upper abdomen demonstrates unchanged positioning of the left-sided internal/external biliary drainage catheter with end overlying the expected location duodenum in radiopaque marker overlying expected location biliary hilum. Cholecystostomy tube overlies expected location of gallbladder lumen Contrast injection demonstrates appropriate position functionality of the left-sided internal/external biliary drainage catheter with brisk passage of contrast through the drainage catheter to the level of the duodenum. There is minimal opacification of the central aspect of the right and left intrahepatic biliary tree both of which appear nondilated Contrast injection of the cholecystostomy tube demonstrates appropriate position functionality. There is passage of contrast to the level of the mid aspect of the cystic duct however despite the acquisition of delayed imaging, there is no definitive passage beyond the mid aspect of the cystic duct to the level of the CBD or the internal/external biliary drainage catheter. FINDINGS: Appropriately positioned and functioning left-sided internal/external biliary drainage catheter with brisk passage of contrast through the drainage catheter to the level of the duodenum and opacification of both the right and left intrahepatic biliary tree, both of which appear nondilated. Cholangiogram via the existing cholecystostomy tube demonstrates appropriate positioning and functionality. There is apparent occlusion of the mid aspect of the cystic duct without passage of contrast to the level of the CBD or the internal/external biliary drainage catheter. IMPRESSION: 1.  Appropriately positioned and functioning left-sided biliary drainage catheter. 2. Appropriately positioned and functioning cholecystostomy tube with occlusion of the mid aspect of the cystic duct. PLAN: - The patient's biliary drainage catheter was capped for a trial of internalization. - The patient should have daily CMPs while admitted to the hospital to insure continued near normalization of the LFTs. If their is recurrent elevation of his LFTs and/or the patient experiences excessive pericatheter drainage, the biliary drainage catheter may be reconnected to a gravity bag as indicated. - Given occlusion of the cystic duct, the cholecystostomy tube should remain to external drainage. - Neither drainage catheter requires routine flushing at this time. - The patient should return for exchange of both catheters in approximately 6 weeks (week of August 1st). Note, consideration for internal biliary stent placement will be determined following next post treatment staging CT. Electronically Signed   By: Sandi Mariscal M.D.   On: 09/29/2020 14:42    Scheduled Meds:  chlorhexidine  15 mL Mouth Rinse BID   Chlorhexidine Gluconate Cloth  6 each Topical Daily   dorzolamide-timolol  1 drop Both Eyes BID   latanoprost  1 drop Both Eyes QHS   mouth rinse  15 mL Mouth Rinse q12n4p   Ensure Max Protein  11 oz Oral TID BM   sodium chloride flush  10-40 mL Intracatheter Q12H   sodium chloride flush  5 mL Intracatheter Q8H   Continuous Infusions:  sodium chloride 125 mL/hr  at 09/29/20 1418   ceFEPime (MAXIPIME) IV 2 g (09/29/20 1255)     LOS: 8 days    Time spent: 25 mins    Everlean Bucher, MD [facility]   If 7PM-7AM, please contact night-coverage

## 2020-09-30 ENCOUNTER — Other Ambulatory Visit (HOSPITAL_COMMUNITY): Payer: Medicare PPO

## 2020-09-30 ENCOUNTER — Ambulatory Visit
Admission: RE | Admit: 2020-09-30 | Discharge: 2020-09-30 | Disposition: A | Discharge status: Home / Self Care | Payer: Medicare PPO | Source: Ambulatory Visit | Attending: Radiation Oncology | Admitting: Radiation Oncology

## 2020-09-30 DIAGNOSIS — C169 Malignant neoplasm of stomach, unspecified: Secondary | ICD-10-CM

## 2020-09-30 DIAGNOSIS — R7881 Bacteremia: Secondary | ICD-10-CM

## 2020-09-30 DIAGNOSIS — R7989 Other specified abnormal findings of blood chemistry: Secondary | ICD-10-CM

## 2020-09-30 DIAGNOSIS — B961 Klebsiella pneumoniae [K. pneumoniae] as the cause of diseases classified elsewhere: Secondary | ICD-10-CM

## 2020-09-30 LAB — COMPREHENSIVE METABOLIC PANEL
ALT: 31 U/L (ref 0–44)
AST: 32 U/L (ref 15–41)
Albumin: 2.3 g/dL — ABNORMAL LOW (ref 3.5–5.0)
Alkaline Phosphatase: 208 U/L — ABNORMAL HIGH (ref 38–126)
Anion gap: 5 (ref 5–15)
BUN: 9 mg/dL (ref 8–23)
CO2: 26 mmol/L (ref 22–32)
Calcium: 8 mg/dL — ABNORMAL LOW (ref 8.9–10.3)
Chloride: 108 mmol/L (ref 98–111)
Creatinine, Ser: 0.52 mg/dL — ABNORMAL LOW (ref 0.61–1.24)
GFR, Estimated: >60 mL/min (ref >60)
Glucose, Bld: 112 mg/dL — ABNORMAL HIGH (ref 70–99)
Potassium: 3.5 mmol/L (ref 3.5–5.1)
Sodium: 139 mmol/L (ref 135–145)
Total Bilirubin: 1.5 mg/dL — ABNORMAL HIGH (ref 0.3–1.2)
Total Protein: 5.4 g/dL — ABNORMAL LOW (ref 6.5–8.1)

## 2020-09-30 LAB — CBC
HCT: 25.5 % — ABNORMAL LOW (ref 39.0–52.0)
Hemoglobin: 8.5 g/dL — ABNORMAL LOW (ref 13.0–17.0)
MCH: 29.1 pg (ref 26.0–34.0)
MCHC: 33.3 g/dL (ref 30.0–36.0)
MCV: 87.3 fL (ref 80.0–100.0)
Platelets: 261 10*3/uL (ref 150–400)
RBC: 2.92 MIL/uL — ABNORMAL LOW (ref 4.22–5.81)
RDW: 14 % (ref 11.5–15.5)
WBC: 3.4 10*3/uL — ABNORMAL LOW (ref 4.0–10.5)
nRBC: 0 % (ref 0.0–0.2)

## 2020-09-30 LAB — MAGNESIUM: Magnesium: 1.7 mg/dL (ref 1.7–2.4)

## 2020-09-30 LAB — PHOSPHORUS: Phosphorus: 2.6 mg/dL (ref 2.5–4.6)

## 2020-09-30 LAB — GLUCOSE, CAPILLARY: Glucose-Capillary: 92 mg/dL (ref 70–99)

## 2020-09-30 MED ORDER — HEPARIN SOD (PORK) LOCK FLUSH 100 UNIT/ML IV SOLN
500.0000 [IU] | INTRAVENOUS | Status: AC | PRN
  Administered 2020-09-30: 500 [IU]

## 2020-09-30 MED ORDER — SULFAMETHOXAZOLE-TRIMETHOPRIM 800-160 MG PO TABS: 1.0000 | ORAL_TABLET | Freq: Two times a day (BID) | ORAL | 0 refills | Status: AC

## 2020-09-30 NOTE — Progress Notes (Signed)
Mobility Specialist Progress Note:    09/30/20 1223  Mobility  Activity Ambulated in hall  Level of Assistance Modified independent, requires aide device or extra time  Assistive Device None;Other (Comment) (IV Pole)  Distance Ambulated (ft) 800 ft  Mobility Ambulated independently in hallway  Mobility Response Tolerated well  Mobility performed by Mobility specialist  $Mobility charge 1 Mobility   Pt ambulated in hallway while pushing IV pole for assistance, total distance 818ft. Pt did not c/o of SOB or dizziness, but did state he felt R knee "lock and catch" towards the end of session. Pt left in chair with call bell at side and chair alarm off per NT request.   [place] Office: [phone] 09/30/20, 12:28 PM

## 2020-09-30 NOTE — Progress Notes (Signed)
Jonathan Lewis is still in the hospital.  Hopefully he will be going home soon.  He finishes radiation therapy.  Sounds like his biliary drainage catheters are doing quite nicely.  When he says, they took out one of the drains tubes.  His bilirubin is 1.5.  His creatinine is 0.52.  His white cell count 3.4.  Hemoglobin 8.5.  Platelet count 261,000.  His appetite is doing a little better.  I think he is eating more.  He is out of bed.  He is not complain of any pain.  There is no nausea or vomiting.  Has had no obvious bleeding.  There is no diarrhea.  Hopefully, he will be going home today.  We will have to arrange for follow-up as an outpatient.  His hemoglobin is little on the lower side.  However, his vital signs are stable.  Blood pressure 137/84.  Pulse is 61.  I think we can hold off on any kind of transfusions.  I would likely plan to get him back in a couple weeks.  We will have to see how he looks and how we can then try to address the metastatic gastric cancer.

## 2020-09-30 NOTE — Progress Notes (Signed)
Pt. Discharged via wheelchair accompanied by staff, pt. Is alert and oriented, no distress. Discharge instructions and education discussed with pt., pt. Verbalized understanding. All personal belongings are with the pt. 

## 2020-09-30 NOTE — Discharge Summary (Signed)
Physician Discharge Summary  Jonathan Lewis BMW:[id number] DOB: Feb 06, 1941 DOA: 09/21/2020  PCP: Biagio Borg, MD  Admit date: 09/21/2020 Discharge date: 09/30/2020  Admitted From: Home Disposition: Home  Recommendations for Outpatient Follow-up:  Follow up with PCP in 1 week with repeat CBC/CMP Outpatient follow-up with oncology and interventional radiology Follow up in ED if symptoms worsen or new appear   Home Health: No Equipment/Devices: None  Discharge Condition: Guarded CODE STATUS: DNR Diet recommendation: Heart healthy/carb modified  Brief/Interim Summary: 80 years old male with PMH significant for metastatic adenocarcinoma of the stomach, s/p Billroth II, DM, HTN who presented in the ED with generalized weakness, fatigue.  He was found to have biliary obstruction secondary to metastatic cancer.  Biliary stent placement was attempted but failed so patient underwent percutaneous drain placement by IR.  He was admitted for septic shock requiring Levophed along with Klebsiella bacteremia requiring IV antibiotics.  ID was consulted.  During the hospitalizing, his biliary drainage catheter was capped by IR.  LFTs have remained stable/improving.  He is currently afebrile and hemodynamically stable.  He will be discharged home today on oral Bactrim for 4 more days.  Outpatient follow-up with oncology and IR.    Discharge Diagnoses:   Septic shock: Present on admission: Resolved Klebsiella bacteremia -Patient initially required Levophed which was discontinued on 09/22/2020. -Blood cultures grew Klebsiella aerogenes.  ID consulted.  Currently on cefepime and ID has recommended Bactrim on discharge to complete 14 days course of treatment. -Patient will be discharged home today on oral Bactrim for 4 more days.  Currently hemodynamically stable and afebrile  Acute metabolic encephalopathy -Resolved.  Mental status stable.  CT head unremarkable on presentation  Biliary obstruction  secondary to metastatic gastric cancer Elevated LFTs -Patient is status post cholecystostomy tube placement and upsizing of biliary drain on 6/23 Patient underwent capping trial for biliary drain on 09/29/20.  LFTs have remained stable/improving. -Outpatient follow-up with IR and oncology.  Hyponatremia -Improved  Chronic normocytic anemia -Hemoglobin stable.  From gastric cancer.  Diabetes mellitus type 2 -Carb modified diet.  Outpatient follow-up with PCP  Discharge Instructions  Discharge Instructions     Diet - low sodium heart healthy   Complete by: As directed    Increase activity slowly   Complete by: As directed    No wound care   Complete by: As directed       Allergies as of 09/30/2020   No Known Allergies      Medication List     STOP taking these medications    acetaminophen 160 MG/5ML liquid Commonly known as: TYLENOL   atorvastatin 20 MG tablet Commonly known as: Lipitor   cephALEXin 500 MG capsule Commonly known as: KEFLEX   traMADol 50 MG tablet Commonly known as: ULTRAM       TAKE these medications    dorzolamide-timolol 22.3-6.8 MG/ML ophthalmic solution Commonly known as: COSOPT Place 1 drop into both eyes 2 (two) times daily.   famotidine 20 MG tablet Commonly known as: PEPCID TAKE 1 TABLET BY MOUTH EVERY DAY   latanoprost 0.005 % ophthalmic solution Commonly known as: XALATAN Place 1 drop into both eyes at bedtime.   lidocaine-prilocaine cream Commonly known as: EMLA Apply 1 application topically as needed. Place on the port one hour before appointment. What changed:  when to take this reasons to take this   multivitamin with minerals Tabs tablet Take 1 tablet by mouth in the morning. Men's One-A-Day Chewable   ondansetron  8 MG tablet Commonly known as: ZOFRAN Take 1 tablet (8 mg total) by mouth every 8 (eight) hours as needed for nausea or vomiting.   senna-docusate 8.6-50 MG tablet Commonly known as: Senokot  S Take 1 tablet by mouth daily.   sulfamethoxazole-trimethoprim 800-160 MG tablet Commonly known as: BACTRIM DS Take 1 tablet by mouth 2 (two) times daily for 4 days. To end after 7/4.   Systane Ultra 0.4-0.3 % Soln Generic drug: Polyethyl Glycol-Propyl Glycol Place 1 drop into both eyes 3 (three) times daily as needed (dry eyes).       ASK your doctor about these medications    meclizine 12.5 MG tablet Commonly known as: ANTIVERT TAKE 1 TABLET BY MOUTH 3 TIMES DAILY AS NEEDED FOR DIZZINESS.        Follow-up Information     Biagio Borg, MD. Schedule an appointment as soon as possible for a visit in 1 week(s).   Specialties: Internal Medicine, Radiology Why: with cbc/cmp Contact information: [place] [address] [phone]         Volanda Napoleon, MD. Schedule an appointment as soon as possible for a visit in 1 week(s).   Specialty: Oncology Contact information: [address] [place] [address] [place]. Schedule an appointment as soon as possible for a visit in 1 week(s).   Specialty: Radiology Contact information: [place] [id number] [place] [phone]               No Known Allergies  Consultations: Oncology/IR   Procedures/Studies: DG Chest 2 View  Result Date: 09/21/2020 CLINICAL DATA:  Weakness and fever for 2 weeks. EXAM: CHEST - 2 VIEW COMPARISON:  03/26/2019 FINDINGS: The cardiomediastinal silhouette is unremarkable. Mild peribronchial thickening again noted. A RIGHT Port-A-Cath is again noted with tip overlying the SUPERIOR cavoatrial junction. There is no evidence of focal airspace disease, pulmonary edema, suspicious pulmonary nodule/mass, pleural effusion, or pneumothorax. No acute bony abnormalities are identified. IMPRESSION: No active cardiopulmonary disease. Electronically  Signed   By: Margarette Canada M.D.   On: 09/21/2020 12:23   CT ABDOMEN PELVIS W CONTRAST  Result Date: 09/11/2020 CLINICAL DATA:  History of gastric cancer, now with elevated bilirubin post failed attempt at endoscopic biliary stent placement as well as percutaneous biliary drainage catheter placement. Please perform abdominal CT for potential repeat attempt at internal/external biliary drainage catheter placement. EXAM: CT ABDOMEN AND PELVIS WITH CONTRAST TECHNIQUE: Multidetector CT imaging of the abdomen and pelvis was performed using the standard protocol following bolus administration of intravenous contrast. CONTRAST:  133mL OMNIPAQUE IOHEXOL 300 MG/ML  SOLN COMPARISON:  MRCP-09/04/2020; ERCP-09/09/2020; PET-CT-06/25/2020 FINDINGS: Lower chest: Limited visualization of the lower thorax demonstrates minimal dependent subpleural ground-glass atelectasis, right greater than left. There is a punctate granuloma within the right middle lobe. No discrete focal airspace opacities. Borderline cardiomegaly. No pericardial effusion. Port a catheter tip terminates within the superior cavoatrial junction. Hepatobiliary: Redemonstrated marked distension of the gallbladder with very minimal intrahepatic biliary ductal dilatation, similar to abdominal MRCP performed 09/04/2020. No definitive gallbladder wall thickening or pericholecystic stranding. Scattered foci air at the level of the porta hepatis and minimal amount of ill-defined stranding adjacent to the anterior aspect the left lobe of the liver and right lateral body wall, the result recent attempted percutaneous biliary drainage catheter placement. Approximately 1.2 cm nodule adjacent to the anterior aspect  the lobe liver image 11, series 7 similar to recent abdominal MRI and again favored to represent a hepatic capsular implant. There is a punctate (approximately 0.6 cm) hypoattenuating lesion with the dome of the right lobe of the liver (image 11, series 2, which is  too small to accurately characterize favored to represent a hepatic cyst. Additional subcentimeter hypoattenuating hepatic lesions (images 21 and 31, series 2) too small to accurately characterize remain indeterminate. Pancreas: Previously question hypovascular involving the pancreatic body on recent abdominal MRI is not well demonstrated the present examination. No definitive downstream pancreatic ductal dilatation or peripancreatic stranding. Spleen: Normal appearance of the spleen. Adrenals/Urinary Tract: There is symmetric enhancement and excretion of the bilateral kidneys. There is a punctate (approximately 5 mm) nonobstructing stone within the inferior pole the right kidney image 34, series 2). No discrete left-sided renal stones on this postcontrast examination. No renal stones are seen along expected course of either ureter or the urinary bladder. Mild thickening of the urinary bladder wall, potentially accentuated due to underdistention. Redemonstrated small left-sided extrarenal pelvis. No evidence of urinary obstruction. Normal appearance the bilateral adrenal glands. Stomach/Bowel: Stable postoperative change the stomach compatible with history partial gastric resection. Moderate colonic stool burden without evidence of enteric obstruction. Scattered colonic diverticulosis without evidence of superimposed acute diverticulitis. Normal appearance of the terminal ileum and the retrocecal appendix. No discrete areas of bowel wall thickening. No pneumoperitoneum, pneumatosis or portal venous gas. Vascular/Lymphatic: Normal caliber of the aorta. There is a very minimal amount of involving the bilateral common iliac and femoral arteries, not resulting in hemodynamically significant stenosis. The major branch vessels the aorta appear patent on this non CTA examination. No bulky retroperitoneal, pelvic or inguinal lymphadenopathy. Reproductive: Dystrophic calcifications within a borderline enlarged prostate  gland. No free fluid the pelvic cul-de-sac. Other: Redemonstrated scattered peritoneal nodules with dominant nodule within the right upper abdominal quadrant measuring 1.4 x 1.1 cm (image 38, series 2). Additional peritoneal nodules are seen on images 27, 38, 43, 46 and 47, series 2. Musculoskeletal: No acute or aggressive osseous abnormalities. Redemonstrated bilateral L5 pars defects associated anterolisthesis. Mild-to-moderate multilevel lumbar spine DDD, worse at L4-L5 with disc space height loss, endplate irregularity and posterior directed osteophytosis. Mild degenerative change the hips with joint space loss, subchondral sclerosis and osteophytosis. No evidence. IMPRESSION: 1. Marked distension of the gallbladder with minimal intrahepatic biliary ductal dilatation, similar to MRCP performed 09/04/2020. 2. Redemonstrated suspected peritoneal metastatic disease including a subcapsular implant adjacent to the left lobe of the liver. 3. Previously questioned pancreatic head mass is not well demonstrated on the present examination though resolution should not assumed on the basis of this examination. 4. Similar appearing indeterminate hepatic lesions at least one of which was found to be hypermetabolic on PET-CT performed 06/25/2020. 5. Solitary punctate (5 mm) nonobstructing right-sided renal stone. Electronically Signed   By: Sandi Mariscal M.D.   On: 09/11/2020 08:29   CT CHEST ABDOMEN PELVIS W CONTRAST  Result Date: 09/21/2020 CLINICAL DATA:  Abdominal pain, fever, weakness. Urinary tract infection. Gastrointestinal cancer. EXAM: CT CHEST, ABDOMEN, AND PELVIS WITH CONTRAST TECHNIQUE: Multidetector CT imaging of the chest, abdomen and pelvis was performed following the standard protocol during bolus administration of intravenous contrast. CONTRAST:  161mL OMNIPAQUE IOHEXOL 300 MG/ML  SOLN COMPARISON:  CT abdomen pelvis 09/10/2020 and CT chest 06/12/2020. MR abdomen 09/04/2020 and PET 06/25/2020 FINDINGS: CT  CHEST FINDINGS Cardiovascular: Right IJ Port-A-Cath terminates at the SVC RA junction. Heart is at the upper  limits of normal in size. No pericardial effusion. Mediastinum/Nodes: Mediastinal lymph nodes measure up to 11 mm in the low right paratracheal station, similar. Calcified mediastinal lymph nodes. No hilar or axillary adenopathy. Esophagus is grossly. Lungs/Pleura: Centrilobular and paraseptal emphysema. Calcified granulomas. Minimal dependent atelectasis bilaterally. No pleural fluid. Adherent debris in the right mainstem bronchus. Musculoskeletal: Degenerative changes in the spine. No worrisome lytic or sclerotic lesions. CT ABDOMEN PELVIS FINDINGS Hepatobiliary: Similar biliary ductal dilatation with a percutaneous biliary drainage catheter in place, terminating in the duodenum. Associated centrally obstructing mass, better seen on 09/04/2020. 9 mm low-attenuation lesion in the periphery of segment 4 (2/65), unchanged and too small to characterize. Other subcapsular metastases seen on 09/03/2020 are not readily appreciated. Gallbladder is dilated. Pancreas: Mass seen in the pancreatic body on 09/04/2020 is poorly appreciated. Spleen: Negative. Adrenals/Urinary Tract: Adrenal glands are unremarkable. Right renal stone. Kidneys are otherwise unremarkable. Ureters are decompressed. Bladder may be minimally thick-walled. Stomach/Bowel: Distal gastrectomy. Stomach, small bowel and appendix are otherwise unremarkable. Fluid seen in the colon. Vascular/Lymphatic: Vascular structures are unremarkable. No pathologically enlarged lymph nodes. Reproductive: Prostate is visualized. Other: Bilateral omental nodules measure up to 1.4 cm, as on 09/10/2020. Mesenteric nodules measure up to 8 mm in short axis (2/86). No free fluid. Left inguinal hernia contains fat. Musculoskeletal: Degenerative changes in the spine. IMPRESSION: 1. No findings to explain the patient's acute clinical history. 2. Peritoneal/omental  metastases, as on 09/10/2020. 3. Percutaneous biliary drain in place with similar gallbladder and biliary ductal dilatation. Associated obstructing mass seen on 09/04/2020 is better seen on that study. 4. Pancreatic body mass, also better seen on 09/04/2020. 5. Right renal stone. Electronically Signed   By: Lorin Picket M.D.   On: 09/21/2020 14:47   MR 3D Recon At Scanner  Result Date: 09/04/2020 CLINICAL DATA:  New onset jaundice.  Metastatic gastric carcinoma. EXAM: MRI ABDOMEN WITHOUT AND WITH CONTRAST (INCLUDING MRCP) TECHNIQUE: Multiplanar multisequence MR imaging of the abdomen was performed both before and after the administration of intravenous contrast. Heavily T2-weighted images of the biliary and pancreatic ducts were obtained, and three-dimensional MRCP images were rendered by post processing. CONTRAST:  13mL GADAVIST GADOBUTROL 1 MMOL/ML IV SOLN COMPARISON:  CT on 06/12/2020 FINDINGS: Lower chest: No acute findings. Hepatobiliary: No intrahepatic masses are seen, however there are a few sub-cm enhancing soft tissue nodules along the capsular surface of the left hepatic lobe consistent with peritoneal metastases. The gallbladder is distended and diffuse intra and extra hepatic biliary ductal dilatation is now seen, with common bile duct measuring 9 mm. Obstruction of the common bile duct is seen in the porta hepatis, due to an irregular soft tissue mass measuring approximately 4 x 3 cm on image 45/41. This most likely represents metastatic lymphadenopathy. Pancreas: A subtle hypovascular mass is seen in the pancreatic body which measures 2.3 x 1.9 cm on image 47/27. This may represent a pancreatic metastasis or adjacent peripancreatic lymphadenopathy. Spleen:  Within normal limits in size and appearance. Adrenals/Urinary Tract: No masses identified. No evidence of hydronephrosis. Stomach/Bowel: Prior gastric jejunostomy again noted. Otherwise unremarkable. Vascular/Lymphatic: See above findings in  hepatobiliary and pancreas sections. Other: Multiple small enhancing peritoneal and omental soft tissue nodules are again seen in the anterior right and left upper quadrants, consistent with peritoneal metastases. Musculoskeletal:  No suspicious bone lesions identified. IMPRESSION: New diffuse biliary ductal dilatation due to soft tissue mass in the porta hepatis adjacent to pancreatic head. This most likely represents metastatic lymphadenopathy. Small mass  in the pancreatic body, most likely representing pancreatic metastasis or adjacent peripancreatic lymphadenopathy. Multiple sub-cm enhancing soft tissue nodules along the capsular surface of the left hepatic lobe and bilateral upper quadrant omental fat, consistent with peritoneal metastases. Electronically Signed   By: Marlaine Hind M.D.   On: 09/04/2020 21:06   IR Perc Cholecystostomy  Result Date: 09/24/2020 INDICATION: History of gastric cancer complicated by development of obstructive jaundice post internal/external biliary drainage catheter placement on 09/10/2020. Unfortunately, the patient returned to the hospital with sepsis with subsequent CT scan and right upper quadrant abdominal ultrasound demonstrated nodular wall thickening of the gallbladder with trace amount of pericholecystic fluid. Patient presents today for definitive cholangiogram via the biliary drainage catheter (to evaluate for patency of the cystic duct) as well as biliary drainage catheter exchange potential up sizing. EXAM: 1. FLUOROSCOPIC GUIDED BILIARY DRAINAGE CATHETER EXCHANGE AND UP SIZING 2. PERCUTANEOUS CHOLANGIOGRAM VIA EXISTING BILIARY DRAINAGE CATHETER. 3. ULTRASOUND AND FLUOROSCOPIC-GUIDED CHOLECYSTOSTOMY TUBE PLACEMENT COMPARISON:  Percutaneous biliary drainage catheter placement-09/10/2020 CT the chest, abdomen pelvis-09/21/2020; right upper quadrant abdominal ultrasound-09/21/2020 CT abdomen and pelvis - 09/10/2020 MEDICATIONS: The patient is currently admitted to the  hospital and on intravenous antibiotics. Antibiotics were administered within an appropriate time frame prior to skin puncture. ANESTHESIA/SEDATION: Moderate (conscious) sedation was employed during this procedure. A total of Versed 2 mg and Fentanyl 100 mcg was administered intravenously. Moderate Sedation Time: 23 minutes. The patient's level of consciousness and vital signs were monitored continuously by radiology nursing throughout the procedure under my direct supervision. CONTRAST:  53mL OMNIPAQUE IOHEXOL 300 MG/ML SOLN - administered into the biliary tree and gallbladder lumen. FLUOROSCOPY TIME:  3 minutes, 42 seconds (244 mGy) COMPLICATIONS: None immediate. PROCEDURE: Informed written consent was obtained from the patient after a discussion of the risks, benefits and alternatives to treatment. Questions regarding the procedure were encouraged and answered. A timeout was performed prior to the initiation of the procedure. Sonographic evaluation was performed of the right quadrant demonstrating persistent nodular mural thickening of the gallbladder with minimal amount pericholecystic fluid. The right upper abdominal quadrant as well as the external portion of the existing biliary drainage catheter and surrounding skin were prepped and draped in the usual sterile fashion, and a sterile drape was applied covering the operative field. Maximum barrier sterile technique with sterile gowns and gloves were used for the procedure. A timeout was performed prior to the initiation of the procedure. Local anesthesia was provided with 1% lidocaine with epinephrine. Cholangiogram was performed via the existing percutaneous biliary drainage catheter demonstrating appropriate position functionality of the drain with passage of contrast to the level of the duodenum. At this time, the external portion of the biliary drainage catheter was cut and cannulated with an Amplatz wire which was advanced through the entirety of the  catheter in coiled within the duodenum. Under intermittent fluoroscopic guidance, the drainage catheter was exchanged for a 9 French, 25 cm radiopaque tip vascular sheath and a definitive pullback sheath cholangiogram was performed however failed to definitively opacify the cystic duct suggesting cystic duct occlusion. Over the Amplatz wire, the vascular sheath was exchanged for a new, slightly larger now 12 [place] biliary drainage catheter with end coiled and locked within the duodenum. Attention was now paid towards placement cholecystostomy tube. Utilizing a transhepatic approach, a 22 gauge needle was advanced into the gallbladder under direct ultrasound guidance. An ultrasound image was saved for documentation purposes. Appropriate intraluminal puncture was confirmed with the efflux of bile and advancement of  an 0.018 wire into the gallbladder lumen. The needle was exchanged for an [place] set. A small amount of contrast was injected to confirm appropriate intraluminal positioning. Over a Benson wire, a 24.2-French Cook cholecystomy tube was advanced into the gallbladder fossa, coiled and locked. Bile was aspirated and a small amount of contrast was injected as several post procedural spot radiographic images were obtained in various obliquities. The catheter was secured to the skin with suture, connected to a drainage bag and a dressing was placed. The patient tolerated the procedure well without immediate post procedural complication. FINDINGS: Preprocedural spot fluoroscopic image demonstrates unchanged positioning of the percutaneous drainage catheter with radiopaque side marker overlying the central aspect of the left biliary hilum and coil overlying the at the location of the duodenum. Contrast injection demonstrates appropriate position functionality of the biliary drainage catheter. Despite definitive/formal sheath cholangiogram, the cystic duct was not opacified suggestive of cystic duct occlusion  and cholecystitis. After fluoroscopic guided exchange and up sizing the new now 67 [place] biliary drainage catheter is appropriately positioned with end coiled and locked within the duodenum. Under direct ultrasound fluoroscopic guidance, a 10 French cholecystostomy tube is appropriately positioned with end coiled within the gallbladder lumen. IMPRESSION: 1. Successful fluoroscopic guided exchange and up sizing of now 12 French percutaneous biliary drainage catheter. 2. Despite formal sheath cholangiogram, the cystic duct was not opacified which when correlated with patient's admission of sepsis and abnormal appearance of the gallbladder on CT and ultrasound is worrisome for concomitant cholecystitis. 3. Successful ultrasound and fluoroscopic guided placement of a 10.2 French cholecystostomy tube. PLAN: - Once patient's bilirubin has hit a nadir, the patient may return for repeat cholangiogram and potential initiation of a capping trial of the percutaneous biliary drainage catheter. Electronically Signed   By: Sandi Mariscal M.D.   On: 09/24/2020 13:54   DG ERCP  Result Date: 09/09/2020 CLINICAL DATA:  Attempted ERCP EXAM: ERCP TECHNIQUE: Multiple spot images obtained with the fluoroscopic device and submitted for interpretation post-procedure. FLUOROSCOPY TIME:  Fluoroscopy Time:  3 minutes 38 seconds Number of Acquired Spot Images: 0 COMPARISON:  MRCP 09/04/2020 FINDINGS: A total of 20 intraoperative saved images are submitted for review. The images demonstrate a flexible duodenal scope in various positions within the duodenum and proximal jejunum. No evidence of immediate complication. IMPRESSION: Attempted ERCP. These images were submitted for radiologic interpretation only. Please see the procedural report for the amount of contrast and the fluoroscopy time utilized. Electronically Signed   By: Jacqulynn Cadet M.D.   On: 09/09/2020 14:47   MR ABDOMEN MRCP W WO CONTAST  Result Date: 09/04/2020 CLINICAL  DATA:  New onset jaundice.  Metastatic gastric carcinoma. EXAM: MRI ABDOMEN WITHOUT AND WITH CONTRAST (INCLUDING MRCP) TECHNIQUE: Multiplanar multisequence MR imaging of the abdomen was performed both before and after the administration of intravenous contrast. Heavily T2-weighted images of the biliary and pancreatic ducts were obtained, and three-dimensional MRCP images were rendered by post processing. CONTRAST:  42mL GADAVIST GADOBUTROL 1 MMOL/ML IV SOLN COMPARISON:  CT on 06/12/2020 FINDINGS: Lower chest: No acute findings. Hepatobiliary: No intrahepatic masses are seen, however there are a few sub-cm enhancing soft tissue nodules along the capsular surface of the left hepatic lobe consistent with peritoneal metastases. The gallbladder is distended and diffuse intra and extra hepatic biliary ductal dilatation is now seen, with common bile duct measuring 9 mm. Obstruction of the common bile duct is seen in the porta hepatis, due to an irregular soft tissue mass  measuring approximately 4 x 3 cm on image 45/41. This most likely represents metastatic lymphadenopathy. Pancreas: A subtle hypovascular mass is seen in the pancreatic body which measures 2.3 x 1.9 cm on image 47/27. This may represent a pancreatic metastasis or adjacent peripancreatic lymphadenopathy. Spleen:  Within normal limits in size and appearance. Adrenals/Urinary Tract: No masses identified. No evidence of hydronephrosis. Stomach/Bowel: Prior gastric jejunostomy again noted. Otherwise unremarkable. Vascular/Lymphatic: See above findings in hepatobiliary and pancreas sections. Other: Multiple small enhancing peritoneal and omental soft tissue nodules are again seen in the anterior right and left upper quadrants, consistent with peritoneal metastases. Musculoskeletal:  No suspicious bone lesions identified. IMPRESSION: New diffuse biliary ductal dilatation due to soft tissue mass in the porta hepatis adjacent to pancreatic head. This most likely  represents metastatic lymphadenopathy. Small mass in the pancreatic body, most likely representing pancreatic metastasis or adjacent peripancreatic lymphadenopathy. Multiple sub-cm enhancing soft tissue nodules along the capsular surface of the left hepatic lobe and bilateral upper quadrant omental fat, consistent with peritoneal metastases. Electronically Signed   By: Marlaine Hind M.D.   On: 09/04/2020 21:06   ECHOCARDIOGRAM COMPLETE  Result Date: 09/22/2020    ECHOCARDIOGRAM REPORT   Patient Name:   KADAN MILLSTEIN [place] Date of Exam: 09/22/2020 Medical Rec #:  [id number]        Height:       68.0 in Accession #:    [id number]       Weight:       174.4 lb Date of Birth:  Jul 18, 1940        BSA:          1.928 m Patient Age:    75 years         BP:           116/82 mmHg Patient Gender: M                HR:           66 bpm. Exam Location:  Inpatient Procedure: 2D Echo, Color Doppler and Cardiac Doppler Indications:    Bacteremia  History:        Patient has prior history of Echocardiogram examinations, most                 recent 05/06/2015. Risk Factors:Diabetes, Hypertension and                 Dyslipidemia.  Sonographer:    Bernadene Person RDCS Referring Phys: [place]  1. Left ventricular ejection fraction, by estimation, is 40 to 45%. The left ventricle has mildly decreased function. The left ventricle demonstrates global hypokinesis. Left ventricular diastolic parameters are consistent with Grade I diastolic dysfunction (impaired relaxation).  2. Right ventricular systolic function is normal. The right ventricular size is normal. Tricuspid regurgitation signal is inadequate for assessing PA pressure.  3. The mitral valve is normal in structure. Mild mitral valve regurgitation. No evidence of mitral stenosis.  4. The aortic valve is tricuspid. Aortic valve regurgitation is not visualized. No aortic stenosis is present.  5. Aortic dilatation noted. There is borderline dilatation of the  aortic root, measuring 39 mm.  6. The inferior vena cava is normal in size with greater than 50% respiratory variability, suggesting right atrial pressure of 3 mmHg. FINDINGS  Left Ventricle: Left ventricular ejection fraction, by estimation, is 40 to 45%. The left ventricle has mildly decreased function. The left ventricle demonstrates global hypokinesis. The left ventricular internal cavity  size was normal in size. There is  no left ventricular hypertrophy. Left ventricular diastolic parameters are consistent with Grade I diastolic dysfunction (impaired relaxation). Right Ventricle: The right ventricular size is normal. Right ventricular systolic function is normal. Tricuspid regurgitation signal is inadequate for assessing PA pressure. The tricuspid regurgitant velocity is 1.44 m/s, and with an assumed right atrial  pressure of 3 mmHg, the estimated right ventricular systolic pressure is 31.5 mmHg. Left Atrium: Left atrial size was normal in size. Right Atrium: Right atrial size was normal in size. Pericardium: There is no evidence of pericardial effusion. Mitral Valve: The mitral valve is normal in structure. Mild mitral valve regurgitation. No evidence of mitral valve stenosis. Tricuspid Valve: The tricuspid valve is normal in structure. Tricuspid valve regurgitation is trivial. No evidence of tricuspid stenosis. Aortic Valve: The aortic valve is tricuspid. Aortic valve regurgitation is not visualized. No aortic stenosis is present. Pulmonic Valve: The pulmonic valve was grossly normal. Pulmonic valve regurgitation is not visualized. No evidence of pulmonic stenosis. Aorta: Aortic dilatation noted. There is borderline dilatation of the aortic root, measuring 39 mm. Venous: The inferior vena cava is normal in size with greater than 50% respiratory variability, suggesting right atrial pressure of 3 mmHg. IAS/Shunts: No atrial level shunt detected by color flow Doppler.  LEFT VENTRICLE PLAX 2D LVIDd:         4.70  cm      Diastology LVIDs:         3.70 cm      LV e' medial:    7.38 cm/s LV PW:         0.70 cm      LV E/e' medial:  7.2 LV IVS:        0.80 cm      LV e' lateral:   7.35 cm/s LVOT diam:     2.10 cm      LV E/e' lateral: 7.3 LV SV:         57 LV SV Index:   29 LVOT Area:     3.46 cm  LV Volumes (MOD) LV vol d, MOD A2C: 118.0 ml LV vol d, MOD A4C: 109.0 ml LV vol s, MOD A2C: 60.7 ml LV vol s, MOD A4C: 43.6 ml LV SV MOD A2C:     57.3 ml LV SV MOD A4C:     109.0 ml LV SV MOD BP:      64.1 ml RIGHT VENTRICLE RV S prime:     7.90 cm/s TAPSE (M-mode): 1.9 cm LEFT ATRIUM             Index       RIGHT ATRIUM           Index LA diam:        2.50 cm 1.30 cm/m  RA Area:     17.30 cm LA Vol (A2C):   38.0 ml 19.71 ml/m RA Volume:   50.40 ml  26.14 ml/m LA Vol (A4C):   39.2 ml 20.33 ml/m LA Biplane Vol: 41.5 ml 21.52 ml/m  AORTIC VALVE LVOT Vmax:   74.10 cm/s LVOT Vmean:  46.200 cm/s LVOT VTI:    0.164 m  AORTA Ao Root diam: 3.90 cm Ao Asc diam:  3.40 cm MITRAL VALVE               TRICUSPID VALVE MV Area (PHT): 2.91 cm    TR Peak grad:   8.3 mmHg MV Decel Time: 261 msec  TR Vmax:        144.00 cm/s MV E velocity: 53.30 cm/s MV A velocity: 58.80 cm/s  SHUNTS MV E/A ratio:  0.91        Systemic VTI:  0.16 m                            Systemic Diam: 2.10 cm Kirk Ruths MD Electronically signed by Kirk Ruths MD Signature Date/Time: 09/22/2020/2:10:50 PM    Final    IR CHOLANGIOGRAM EXISTING TUBE  Result Date: 09/29/2020 INDICATION: History of gastric cancer complicated by development obstructive jaundice, post internal/external biliary drainage catheter placement on 09/10/2020 with subsequent fluoroscopic guided exchange and up sizing performed 09/24/2020. Additionally, patient was recently admitted with sepsis and ultimately underwent image guided placement of a cholecystostomy tube also on 09/04/2020. The patient's LFTs have been steadily improving and as such patient presents today for percutaneous  cholangiogram via the existing biliary drainage catheter in hopes of initiating a capping trial. Additionally, we will proceed with percutaneous cholangiogram via the cholecystostomy tube to evaluate patency of the cystic duct. EXAM: 1. CHOLANGIOGRAM VIA THE EXISTING PERCUTANEOUS BILIARY DRAINAGE CATHETER 2. CHOLANGIOGRAM VIA THE EXISTING PERCUTANEOUS CHOLECYSTOSTOMY TUBE COMPARISON:  Image guided percutaneous biliary drainage catheter exchange and up sizing with cholecystostomy tube placement-09/24/2020 Image guided left-sided internal/external biliary drainage catheter placement-09/10/2020 CT abdomen and pelvis-09/21/2020; 09/10/2020 MEDICATIONS: None, the patient is admitted to the hospital and currently receiving intravenous antibiotics; the antibiotic was administered with an appropriate time frame prior to the initiation of the procedure CONTRAST:  A total of 20 cc Omnipaque 300 was administered into the biliary tree and gallbladder. ANESTHESIA/SEDATION: None FLUOROSCOPY TIME:  1 minutes, 42 seconds (36 mGy) COMPLICATIONS: None immediate. TECHNIQUE: Patient was placed supine on the fluoroscopy table Preprocedural spot fluoroscopic image was obtained of the upper abdomen demonstrates unchanged positioning of the left-sided internal/external biliary drainage catheter with end overlying the expected location duodenum in radiopaque marker overlying expected location biliary hilum. Cholecystostomy tube overlies expected location of gallbladder lumen Contrast injection demonstrates appropriate position functionality of the left-sided internal/external biliary drainage catheter with brisk passage of contrast through the drainage catheter to the level of the duodenum. There is minimal opacification of the central aspect of the right and left intrahepatic biliary tree both of which appear nondilated Contrast injection of the cholecystostomy tube demonstrates appropriate position functionality. There is passage of  contrast to the level of the mid aspect of the cystic duct however despite the acquisition of delayed imaging, there is no definitive passage beyond the mid aspect of the cystic duct to the level of the CBD or the internal/external biliary drainage catheter. FINDINGS: Appropriately positioned and functioning left-sided internal/external biliary drainage catheter with brisk passage of contrast through the drainage catheter to the level of the duodenum and opacification of both the right and left intrahepatic biliary tree, both of which appear nondilated. Cholangiogram via the existing cholecystostomy tube demonstrates appropriate positioning and functionality. There is apparent occlusion of the mid aspect of the cystic duct without passage of contrast to the level of the CBD or the internal/external biliary drainage catheter. IMPRESSION: 1. Appropriately positioned and functioning left-sided biliary drainage catheter. 2. Appropriately positioned and functioning cholecystostomy tube with occlusion of the mid aspect of the cystic duct. PLAN: - The patient's biliary drainage catheter was capped for a trial of internalization. - The patient should have daily CMPs while admitted to the hospital to insure continued near  normalization of the LFTs. If their is recurrent elevation of his LFTs and/or the patient experiences excessive pericatheter drainage, the biliary drainage catheter may be reconnected to a gravity bag as indicated. - Given occlusion of the cystic duct, the cholecystostomy tube should remain to external drainage. - Neither drainage catheter requires routine flushing at this time. - The patient should return for exchange of both catheters in approximately 6 weeks (week of August 1st). Note, consideration for internal biliary stent placement will be determined following next post treatment staging CT. Electronically Signed   By: Sandi Mariscal M.D.   On: 09/29/2020 14:42   IR CHOLANGIOGRAM EXISTING  TUBE  Result Date: 09/29/2020 INDICATION: History of gastric cancer complicated by development obstructive jaundice, post internal/external biliary drainage catheter placement on 09/10/2020 with subsequent fluoroscopic guided exchange and up sizing performed 09/24/2020. Additionally, patient was recently admitted with sepsis and ultimately underwent image guided placement of a cholecystostomy tube also on 09/04/2020. The patient's LFTs have been steadily improving and as such patient presents today for percutaneous cholangiogram via the existing biliary drainage catheter in hopes of initiating a capping trial. Additionally, we will proceed with percutaneous cholangiogram via the cholecystostomy tube to evaluate patency of the cystic duct. EXAM: 1. CHOLANGIOGRAM VIA THE EXISTING PERCUTANEOUS BILIARY DRAINAGE CATHETER 2. CHOLANGIOGRAM VIA THE EXISTING PERCUTANEOUS CHOLECYSTOSTOMY TUBE COMPARISON:  Image guided percutaneous biliary drainage catheter exchange and up sizing with cholecystostomy tube placement-09/24/2020 Image guided left-sided internal/external biliary drainage catheter placement-09/10/2020 CT abdomen and pelvis-09/21/2020; 09/10/2020 MEDICATIONS: None, the patient is admitted to the hospital and currently receiving intravenous antibiotics; the antibiotic was administered with an appropriate time frame prior to the initiation of the procedure CONTRAST:  A total of 20 cc Omnipaque 300 was administered into the biliary tree and gallbladder. ANESTHESIA/SEDATION: None FLUOROSCOPY TIME:  1 minutes, 42 seconds (36 mGy) COMPLICATIONS: None immediate. TECHNIQUE: Patient was placed supine on the fluoroscopy table Preprocedural spot fluoroscopic image was obtained of the upper abdomen demonstrates unchanged positioning of the left-sided internal/external biliary drainage catheter with end overlying the expected location duodenum in radiopaque marker overlying expected location biliary hilum. Cholecystostomy tube  overlies expected location of gallbladder lumen Contrast injection demonstrates appropriate position functionality of the left-sided internal/external biliary drainage catheter with brisk passage of contrast through the drainage catheter to the level of the duodenum. There is minimal opacification of the central aspect of the right and left intrahepatic biliary tree both of which appear nondilated Contrast injection of the cholecystostomy tube demonstrates appropriate position functionality. There is passage of contrast to the level of the mid aspect of the cystic duct however despite the acquisition of delayed imaging, there is no definitive passage beyond the mid aspect of the cystic duct to the level of the CBD or the internal/external biliary drainage catheter. FINDINGS: Appropriately positioned and functioning left-sided internal/external biliary drainage catheter with brisk passage of contrast through the drainage catheter to the level of the duodenum and opacification of both the right and left intrahepatic biliary tree, both of which appear nondilated. Cholangiogram via the existing cholecystostomy tube demonstrates appropriate positioning and functionality. There is apparent occlusion of the mid aspect of the cystic duct without passage of contrast to the level of the CBD or the internal/external biliary drainage catheter. IMPRESSION: 1. Appropriately positioned and functioning left-sided biliary drainage catheter. 2. Appropriately positioned and functioning cholecystostomy tube with occlusion of the mid aspect of the cystic duct. PLAN: - The patient's biliary drainage catheter was capped for a trial of internalization. -  The patient should have daily CMPs while admitted to the hospital to insure continued near normalization of the LFTs. If their is recurrent elevation of his LFTs and/or the patient experiences excessive pericatheter drainage, the biliary drainage catheter may be reconnected to a gravity  bag as indicated. - Given occlusion of the cystic duct, the cholecystostomy tube should remain to external drainage. - Neither drainage catheter requires routine flushing at this time. - The patient should return for exchange of both catheters in approximately 6 weeks (week of August 1st). Note, consideration for internal biliary stent placement will be determined following next post treatment staging CT. Electronically Signed   By: Sandi Mariscal M.D.   On: 09/29/2020 14:42   IR BILIARY DRAIN PLACEMENT WITH CHOLANGIOGRAM  Result Date: 09/10/2020 INDICATION: History of gastric cancer, now with obstructive jaundice, post failed attempt at ERCP biliary stent placement. EXAM: ATTEMPTED THOUGH ULTIMATELY UNSUCCESSFUL ULTRASOUND AND FLUOROSCOPIC GUIDED PERCUTANEOUS TRANSHEPATIC CHOLANGIOGRAM AND BILIARY TUBE PLACEMENT COMPARISON:  MRCP-09/04/2020; PET-CT-06/26/2018 MEDICATIONS: Cefoxitin 2 gm IV; The antibiotic was administered with an appropriate time frame prior to the initiation of the procedure CONTRAST:  45 mL OMNIPAQUE IOHEXOL 300 MG/ML  SOLN ANESTHESIA/SEDATION: Moderate (conscious) sedation was employed during this procedure. A total of Versed 3 mg and Fentanyl 100 mcg was administered intravenously. Moderate Sedation Time: 88 minutes. The patient's level of consciousness and vital signs were monitored continuously by radiology nursing throughout the procedure under my direct supervision. FLUOROSCOPY TIME:  10 minutes, 54 seconds (188 mGy) COMPLICATIONS: None immediate. TECHNIQUE: Informed written consent was obtained from the patient after a discussion of the risks, benefits and alternatives to treatment. Questions regarding the procedure were encouraged and answered. A timeout was performed prior to the initiation of the procedure. The right upper abdominal quadrant was prepped and draped in the usual sterile fashion, and a sterile drape was applied covering the operative field. Maximum barrier sterile  technique with sterile gowns and gloves were used for the procedure. A timeout was performed prior to the initiation of the procedure. Sonographic evaluation was performed the liver demonstrating only a small amount of peripheral and central intrahepatic biliary duct dilatation with marked distension of gallbladder. Prolonged efforts were made to cannulate a peripherally located bile duct within both the right and left lobes of the liver however despite prolonged effort, a bile duct was unable to be successfully cannulated catheterized either with ultrasound or fluoroscopic guidance secondary to lack of biliary ductal dilatation as well as poor percutaneous window due to interposition the ribs and the costal cartilage. As such the procedure was terminated. Dressings were applied. The patient tolerated the procedure well without immediate postprocedural complication. FINDINGS: Sonographic evaluation demonstrates lack of significant dilatation of either the peripheral or central aspect of the intrahepatic biliary tree and despite prolonged efforts, a bile duct was unable to be successfully opacified and/or cannulated. IMPRESSION: Attempted though ultimately unsuccessful percutaneous cholangiogram and biliary drain placement due to a combination lack significant dilatation the central of peripheral intrahepatic biliary system as well as poor percutaneous window due to interposition of ribs and the costal cartilage. PLAN: Will recheck patient's bilirubin level tomorrow and will discuss with the providing service the appropriateness of proceeding with repeat attempt versus cholecystostomy tube placement as indicated. Electronically Signed   By: Sandi Mariscal M.D.   On: 09/10/2020 09:57   IR INT EXT BILIARY DRAIN WITH CHOLANGIOGRAM  Result Date: 09/10/2020 INDICATION: History of gastric cancer, now with obstructive jaundice, post failed attempt at ERCP biliary  stent placement (due to surgical anatomy) as well as failed  attempt percutaneous biliary drainage placement yesterday. As such, patient presents today for repeat attempt at internal/external biliary drainage catheter placement EXAM: ULTRASOUND AND FLUOROSCOPIC GUIDED PERCUTANEOUS TRANSHEPATIC CHOLANGIOGRAM AND BILIARY TUBE PLACEMENT COMPARISON:  Attempted internal/external biliary drainage catheter placement-09/09/2020 CT abdomen and pelvis-earlier same day MRCP-09/04/2020 PET-CT-06/25/2020 MEDICATIONS: Cefoxitin 2 gm IV; The antibiotic was administered with an appropriate time frame prior to the initiation of the procedure CONTRAST:  55mL OMNIPAQUE IOHEXOL 300 MG/ML SOLN - administered into the biliary tree. ANESTHESIA/SEDATION: Moderate (conscious) sedation was employed during this procedure. A total of Versed 2 mg and Fentanyl 100 mcg was administered intravenously. Moderate Sedation Time: 28 minutes. The patient's level of consciousness and vital signs were monitored continuously by radiology nursing throughout the procedure under my direct supervision. FLUOROSCOPY TIME:  4 minutes, 42 seconds (696 mGy close COMPLICATIONS: None immediate. TECHNIQUE: Informed written consent was obtained from the patient after a discussion of the risks, benefits and alternatives to treatment. Questions regarding the procedure were encouraged and answered. A timeout was performed prior to the initiation of the procedure. The right upper abdominal quadrant was prepped and draped in the usual sterile fashion, and a sterile drape was applied covering the operative field. Maximum barrier sterile technique with sterile gowns and gloves were used for the procedure. A timeout was performed prior to the initiation of the procedure. Exhaustive sonographic evaluation performed of the liver by the dictating interventional radiologist ultimately delineating a mildly dilated duct within the peripheral aspect the left lobe of the liver. After the overlying soft tissues were anesthetized with 1%  Lidocaine with epinephrine, under direct ultrasound guidance, a 22 gauge Chiba needle was utilized to cannulate the peripheral aspect of the targeted left intrahepatic biliary duct. Appropriate position was confirmed with limited contrast injection. Next, the duct was cannulated with a Nitrex wire and dilated with an Accustick set under fluoroscopic guidance. Limited cholangiograms were performed in various obliquities confirming appropriate access. Nitrex wire was advanced to the level of the duodenum and exchanged over a 4 French angled glide catheter for a Amplatz wire. Under intermittent fluoroscopic guidance the track was dilated ultimately allowing placement of a 10 [place] biliary drainage catheter with coil ultimately locked within the duodenum. Contrast was injected and a completion radiographs were obtained in various obliquities. The catheter was connected to a drainage bag which yielded the brisk return of clear bile. The catheter was secured to the skin with an interrupted suture and StatLock device. Dressings were applied. The patient tolerated the procedure well without immediate postprocedural complication. FINDINGS: Exhaustive sonographic evaluation performed by the dictating interventional radiologist ultimately demonstrated mildly dilated duct within the peripheral aspect of the left lobe of the liver which was targeted under direct ultrasound guidance, ultimately allowing fluoroscopic guided placement of a 10 French percutaneous drainage catheter with end ultimately coiled and locked within the duodenum and radiopaque side marker located proximal to the level of the biliary hilum. Contrast injections demonstrate malignant narrowing/subtotal occlusion involving the mid and distal aspects of the CBD just caudal to the take-off of the cystic duct as was demonstrated on preceding MRCP. Biliary drainage catheter demonstrates opacification both the right and left intrahepatic biliary trees. IMPRESSION:  Successful placement of a 10.2 French percutaneous biliary drainage catheter via left-sided approach with end coiled and locked within the duodenum. PLAN: - Recommend obtaining daily CMPs while the patient is admitted to the hospital. - Maintain the percutaneous biliary drainage catheter  to external drainage until the patient's bilirubin has reached a nadir. - Once the patient's bilirubin has reached a nadir, the patient may return for definitive percutaneous cholangiogram and potential initiation of a capping trial as indicated. Note, ultimately, the patient may be a candidate for biliary stent placement as indicated. Electronically Signed   By: Sandi Mariscal M.D.   On: 09/10/2020 17:12   IR EXCHANGE BILIARY DRAIN  Result Date: 09/24/2020 INDICATION: History of gastric cancer complicated by development of obstructive jaundice post internal/external biliary drainage catheter placement on 09/10/2020. Unfortunately, the patient returned to the hospital with sepsis with subsequent CT scan and right upper quadrant abdominal ultrasound demonstrated nodular wall thickening of the gallbladder with trace amount of pericholecystic fluid. Patient presents today for definitive cholangiogram via the biliary drainage catheter (to evaluate for patency of the cystic duct) as well as biliary drainage catheter exchange potential up sizing. EXAM: 1. FLUOROSCOPIC GUIDED BILIARY DRAINAGE CATHETER EXCHANGE AND UP SIZING 2. PERCUTANEOUS CHOLANGIOGRAM VIA EXISTING BILIARY DRAINAGE CATHETER. 3. ULTRASOUND AND FLUOROSCOPIC-GUIDED CHOLECYSTOSTOMY TUBE PLACEMENT COMPARISON:  Percutaneous biliary drainage catheter placement-09/10/2020 CT the chest, abdomen pelvis-09/21/2020; right upper quadrant abdominal ultrasound-09/21/2020 CT abdomen and pelvis - 09/10/2020 MEDICATIONS: The patient is currently admitted to the hospital and on intravenous antibiotics. Antibiotics were administered within an appropriate time frame prior to skin puncture.  ANESTHESIA/SEDATION: Moderate (conscious) sedation was employed during this procedure. A total of Versed 2 mg and Fentanyl 100 mcg was administered intravenously. Moderate Sedation Time: 23 minutes. The patient's level of consciousness and vital signs were monitored continuously by radiology nursing throughout the procedure under my direct supervision. CONTRAST:  21mL OMNIPAQUE IOHEXOL 300 MG/ML SOLN - administered into the biliary tree and gallbladder lumen. FLUOROSCOPY TIME:  3 minutes, 42 seconds (662 mGy) COMPLICATIONS: None immediate. PROCEDURE: Informed written consent was obtained from the patient after a discussion of the risks, benefits and alternatives to treatment. Questions regarding the procedure were encouraged and answered. A timeout was performed prior to the initiation of the procedure. Sonographic evaluation was performed of the right quadrant demonstrating persistent nodular mural thickening of the gallbladder with minimal amount pericholecystic fluid. The right upper abdominal quadrant as well as the external portion of the existing biliary drainage catheter and surrounding skin were prepped and draped in the usual sterile fashion, and a sterile drape was applied covering the operative field. Maximum barrier sterile technique with sterile gowns and gloves were used for the procedure. A timeout was performed prior to the initiation of the procedure. Local anesthesia was provided with 1% lidocaine with epinephrine. Cholangiogram was performed via the existing percutaneous biliary drainage catheter demonstrating appropriate position functionality of the drain with passage of contrast to the level of the duodenum. At this time, the external portion of the biliary drainage catheter was cut and cannulated with an Amplatz wire which was advanced through the entirety of the catheter in coiled within the duodenum. Under intermittent fluoroscopic guidance, the drainage catheter was exchanged for a 9  French, 25 cm radiopaque tip vascular sheath and a definitive pullback sheath cholangiogram was performed however failed to definitively opacify the cystic duct suggesting cystic duct occlusion. Over the Amplatz wire, the vascular sheath was exchanged for a new, slightly larger now 12 [place] biliary drainage catheter with end coiled and locked within the duodenum. Attention was now paid towards placement cholecystostomy tube. Utilizing a transhepatic approach, a 22 gauge needle was advanced into the gallbladder under direct ultrasound guidance. An ultrasound image was saved for documentation  purposes. Appropriate intraluminal puncture was confirmed with the efflux of bile and advancement of an 0.018 wire into the gallbladder lumen. The needle was exchanged for an Marfa set. A small amount of contrast was injected to confirm appropriate intraluminal positioning. Over a Benson wire, a 62.2-French Cook cholecystomy tube was advanced into the gallbladder fossa, coiled and locked. Bile was aspirated and a small amount of contrast was injected as several post procedural spot radiographic images were obtained in various obliquities. The catheter was secured to the skin with suture, connected to a drainage bag and a dressing was placed. The patient tolerated the procedure well without immediate post procedural complication. FINDINGS: Preprocedural spot fluoroscopic image demonstrates unchanged positioning of the percutaneous drainage catheter with radiopaque side marker overlying the central aspect of the left biliary hilum and coil overlying the at the location of the duodenum. Contrast injection demonstrates appropriate position functionality of the biliary drainage catheter. Despite definitive/formal sheath cholangiogram, the cystic duct was not opacified suggestive of cystic duct occlusion and cholecystitis. After fluoroscopic guided exchange and up sizing the new now 16 [place] biliary drainage catheter is  appropriately positioned with end coiled and locked within the duodenum. Under direct ultrasound fluoroscopic guidance, a 10 French cholecystostomy tube is appropriately positioned with end coiled within the gallbladder lumen. IMPRESSION: 1. Successful fluoroscopic guided exchange and up sizing of now 12 French percutaneous biliary drainage catheter. 2. Despite formal sheath cholangiogram, the cystic duct was not opacified which when correlated with patient's admission of sepsis and abnormal appearance of the gallbladder on CT and ultrasound is worrisome for concomitant cholecystitis. 3. Successful ultrasound and fluoroscopic guided placement of a 10.2 French cholecystostomy tube. PLAN: - Once patient's bilirubin has hit a nadir, the patient may return for repeat cholangiogram and potential initiation of a capping trial of the percutaneous biliary drainage catheter. Electronically Signed   By: Sandi Mariscal M.D.   On: 09/24/2020 13:54   US ABDOMEN LIMITED RUQ (LIVER/GB)  Result Date: 09/21/2020 CLINICAL DATA:  Fever EXAM: ULTRASOUND ABDOMEN LIMITED RIGHT UPPER QUADRANT COMPARISON:  None. FINDINGS: Gallbladder: No gallstones. Gallbladder wall edema. Minor pericholecystic fluid. No sonographic Murphy sign noted by sonographer. Common bile duct: Diameter: Not well visualized, measuring 6 mm. Echogenic material may reflect sludge. A percutaneous biliary drain is noted on the prior CT. Liver: No focal lesion identified. Intrahepatic duct dilatation. Within normal limits in parenchymal echogenicity. Portal vein is patent on color Doppler imaging with normal direction of blood flow towards the liver. Other: None. IMPRESSION: Nonspecific gallbladder wall edema. No sonographic evidence of acute cholecystitis. Intrahepatic duct dilatation. Common bile duct containing echogenic material that may reflect sludge. Electronically Signed   By: Macy Mis M.D.   On: 09/21/2020 20:35      Subjective: Patient seen and  examined at bedside.  Denies worsening abdominal pain, nausea, vomiting or fever.  Feels okay going home today.  Discharge Exam: Vitals:   09/29/20 2009 09/30/20 0503  BP: 134/85 137/84  Pulse: 71 61  Resp: 18 18  Temp: 98 F (36.7 C) 98.2 F (36.8 C)  SpO2: 100% 100%    General: Pt is alert, awake, not in acute distress Cardiovascular: rate controlled, S1/S2 + Respiratory: bilateral decreased breath sounds at bases Abdominal: Soft, NT, slightly distended, bowel sounds +.  Biliary drain present Extremities: Trace lower extremity edema; no cyanosis    The results of significant diagnostics from this hospitalization (including imaging, microbiology, ancillary and laboratory) are listed below for reference.  Microbiology: Recent Results (from the past 240 hour(s))  Culture, blood (Routine x 2)     Status: Abnormal   Collection Time: 09/21/20 11:26 AM   Specimen: BLOOD  Result Value Ref Range Status   Specimen Description   Final    BLOOD BLOOD LEFT FOREARM Performed at [facility] [address] [place], [place] [address]    Special Requests   Final    BOTTLES DRAWN AEROBIC AND ANAEROBIC Blood Culture results may not be optimal due to an inadequate volume of blood received in culture bottles Performed at [facility] [address], [place], [place] [address]    Culture  Setup Time   Final    GRAM NEGATIVE RODS IN BOTH AEROBIC AND ANAEROBIC BOTTLES CRITICAL RESULT CALLED TO, READ BACK BY AND VERIFIED WITH: PHARMD Joyice Faster [phone] FCP Performed at [facility], [place] [address] [place], [place] [address]    Culture ENTEROBACTER AEROGENES (A)  Final   Report Status 09/24/2020 FINAL  Final   Organism ID, Bacteria ENTEROBACTER AEROGENES  Final      Susceptibility   Enterobacter aerogenes - MIC*    CEFAZOLIN RESISTANT Resistant     CEFEPIME <=0.12 SENSITIVE Sensitive     CEFTAZIDIME <=1 SENSITIVE Sensitive      CEFTRIAXONE <=0.25 SENSITIVE Sensitive     CIPROFLOXACIN <=0.25 SENSITIVE Sensitive     GENTAMICIN <=1 SENSITIVE Sensitive     IMIPENEM <=0.25 SENSITIVE Sensitive     TRIMETH/SULFA <=20 SENSITIVE Sensitive     PIP/TAZO <=4 SENSITIVE Sensitive     * ENTEROBACTER AEROGENES  Blood Culture ID Panel (Reflexed)     Status: Abnormal   Collection Time: 09/21/20 11:26 AM  Result Value Ref Range Status   Enterococcus faecalis NOT DETECTED NOT DETECTED Final   Enterococcus Faecium NOT DETECTED NOT DETECTED Final   Listeria monocytogenes NOT DETECTED NOT DETECTED Final   Staphylococcus species NOT DETECTED NOT DETECTED Final   Staphylococcus aureus (BCID) NOT DETECTED NOT DETECTED Final   Staphylococcus epidermidis NOT DETECTED NOT DETECTED Final   Staphylococcus lugdunensis NOT DETECTED NOT DETECTED Final   Streptococcus species NOT DETECTED NOT DETECTED Final   Streptococcus agalactiae NOT DETECTED NOT DETECTED Final   Streptococcus pneumoniae NOT DETECTED NOT DETECTED Final   Streptococcus pyogenes NOT DETECTED NOT DETECTED Final   A.calcoaceticus-baumannii NOT DETECTED NOT DETECTED Final   Bacteroides fragilis NOT DETECTED NOT DETECTED Final   Enterobacterales DETECTED (A) NOT DETECTED Final    Comment: Enterobacterales represent a large order of gram negative bacteria, not a single organism. CRITICAL RESULT CALLED TO, READ BACK BY AND VERIFIED WITH: PHARMD NICK G. [phone] FCP    Enterobacter cloacae complex NOT DETECTED NOT DETECTED Final   Escherichia coli NOT DETECTED NOT DETECTED Final   Klebsiella aerogenes DETECTED (A) NOT DETECTED Final    Comment: CRITICAL RESULT CALLED TO, READ BACK BY AND VERIFIED WITH: PHARMD NICK G. [phone] FCP    Klebsiella oxytoca NOT DETECTED NOT DETECTED Final   Klebsiella pneumoniae NOT DETECTED NOT DETECTED Final   Proteus species NOT DETECTED NOT DETECTED Final   Salmonella species NOT DETECTED NOT DETECTED Final   Serratia marcescens NOT  DETECTED NOT DETECTED Final   Haemophilus influenzae NOT DETECTED NOT DETECTED Final   Neisseria meningitidis NOT DETECTED NOT DETECTED Final   Pseudomonas aeruginosa NOT DETECTED NOT DETECTED Final   Stenotrophomonas maltophilia NOT DETECTED NOT DETECTED Final   Candida albicans NOT DETECTED NOT  DETECTED Final   Candida auris NOT DETECTED NOT DETECTED Final   Candida glabrata NOT DETECTED NOT DETECTED Final   Candida krusei NOT DETECTED NOT DETECTED Final   Candida parapsilosis NOT DETECTED NOT DETECTED Final   Candida tropicalis NOT DETECTED NOT DETECTED Final   Cryptococcus neoformans/gattii NOT DETECTED NOT DETECTED Final   CTX-M ESBL NOT DETECTED NOT DETECTED Final   Carbapenem resistance IMP NOT DETECTED NOT DETECTED Final   Carbapenem resistance KPC NOT DETECTED NOT DETECTED Final   Carbapenem resistance NDM NOT DETECTED NOT DETECTED Final   Carbapenem resist OXA 48 LIKE NOT DETECTED NOT DETECTED Final   Carbapenem resistance VIM NOT DETECTED NOT DETECTED Final    Comment: Performed at [facility] [address], [place], [place] [address]  Resp Panel by RT-PCR (Flu A&B, Covid) Nasopharyngeal Swab     Status: None   Collection Time: 09/21/20 11:33 AM   Specimen: Nasopharyngeal Swab; Nasopharyngeal(NP) swabs in vial transport medium  Result Value Ref Range Status   SARS Coronavirus 2 by RT PCR NEGATIVE NEGATIVE Final    Comment: (NOTE) SARS-CoV-2 target nucleic acids are NOT DETECTED.  The SARS-CoV-2 RNA is generally detectable in upper respiratory specimens during the acute phase of infection. The lowest concentration of SARS-CoV-2 viral copies this assay can detect is 138 copies/mL. A negative result does not preclude SARS-Cov-2 infection and should not be used as the sole basis for treatment or other patient management decisions. A negative result may occur with  improper specimen collection/handling, submission of specimen other than nasopharyngeal swab, presence  of viral mutation(s) within the areas targeted by this assay, and inadequate number of viral copies(<138 copies/mL). A negative result must be combined with clinical observations, patient history, and epidemiological information. The expected result is Negative.  Fact Sheet for Patients:  EntrepreneurPulse.com.au  Fact Sheet for Healthcare Providers:  IncredibleEmployment.be  This test is no t yet approved or cleared by the [place] FDA and  has been authorized for detection and/or diagnosis of SARS-CoV-2 by FDA under an Emergency Use Authorization (EUA). This EUA will remain  in effect (meaning this test can be used) for the duration of the COVID-19 declaration under Section 564(b)(1) of the Act, 21 U.S.C.section 360bbb-3(b)(1), unless the authorization is terminated  or revoked sooner.       Influenza A by PCR NEGATIVE NEGATIVE Final   Influenza B by PCR NEGATIVE NEGATIVE Final    Comment: (NOTE) The Xpert Xpress SARS-CoV-2/FLU/RSV plus assay is intended as an aid in the diagnosis of influenza from Nasopharyngeal swab specimens and should not be used as a sole basis for treatment. Nasal washings and aspirates are unacceptable for Xpert Xpress SARS-CoV-2/FLU/RSV testing.  Fact Sheet for Patients: EntrepreneurPulse.com.au  Fact Sheet for Healthcare Providers: IncredibleEmployment.be  This test is not yet approved or cleared by the [place] FDA and has been authorized for detection and/or diagnosis of SARS-CoV-2 by FDA under an Emergency Use Authorization (EUA). This EUA will remain in effect (meaning this test can be used) for the duration of the COVID-19 declaration under Section 564(b)(1) of the Act, 21 U.S.C. section 360bbb-3(b)(1), unless the authorization is terminated or revoked.  Performed at [facility], [place] [address] [place], [place] [address]   Culture, blood  (Routine x 2)     Status: Abnormal   Collection Time: 09/21/20 11:42 AM   Specimen: BLOOD  Result Value Ref Range Status   Specimen Description   Final    BLOOD  PORTA CATH Performed at [facility], [place] [address] [place], [place] [address]    Special Requests   Final    BOTTLES DRAWN AEROBIC AND ANAEROBIC Blood Culture adequate volume Performed at [facility] [address] [place], [place] [address]    Culture  Setup Time   Final    GRAM NEGATIVE RODS IN BOTH AEROBIC AND ANAEROBIC BOTTLES    Culture (A)  Final    ENTEROBACTER AEROGENES SUSCEPTIBILITIES PERFORMED ON PREVIOUS CULTURE WITHIN THE LAST 5 DAYS. Performed at [facility], [place] [address], [place], [place] [address]    Report Status 09/24/2020 FINAL  Final  Urine culture     Status: None   Collection Time: 09/21/20  2:45 PM   Specimen: In/Out Cath Urine  Result Value Ref Range Status   Specimen Description   Final    IN/OUT CATH URINE Performed at [facility] [address], [place], [place] [address]    Special Requests   Final    NONE Performed at [facility], [place] [address], [place], [place] [address]    Culture   Final    NO GROWTH Performed at [facility], [place] [address], [place], [place] [address]    Report Status 09/23/2020 FINAL  Final  Body fluid culture w Gram Stain     Status: None   Collection Time: 09/21/20  3:15 PM   Specimen: BILE; Body Fluid  Result Value Ref Range Status   Specimen Description   Final    BILE Performed at [facility] [address] [place], [place] [address]    Special Requests   Final    Immunocompromised Performed at [facility], [place] [address] [place], [place] [address]    Gram Stain   Final    NO WBC SEEN RARE GRAM NEGATIVE RODS Performed at [facility], [place] [address], [place], [place] [address]    Culture   Final     RARE ENTEROBACTER AEROGENES MODERATE CANDIDA ALBICANS    Report Status 09/25/2020 FINAL  Final   Organism ID, Bacteria ENTEROBACTER AEROGENES  Final      Susceptibility   Enterobacter aerogenes - MIC*    CEFAZOLIN RESISTANT Resistant     CEFEPIME <=0.12 SENSITIVE Sensitive     CEFTAZIDIME <=1 SENSITIVE Sensitive     CEFTRIAXONE <=0.25 SENSITIVE Sensitive     CIPROFLOXACIN <=0.25 SENSITIVE Sensitive     GENTAMICIN <=1 SENSITIVE Sensitive     IMIPENEM <=0.25 SENSITIVE Sensitive     TRIMETH/SULFA <=20 SENSITIVE Sensitive     PIP/TAZO <=4 SENSITIVE Sensitive     * RARE ENTEROBACTER AEROGENES  MRSA PCR Screening     Status: None   Collection Time: 09/21/20  4:44 PM  Result Value Ref Range Status   MRSA by PCR NEGATIVE NEGATIVE Final    Comment:        The GeneXpert MRSA Assay (FDA approved for NASAL specimens only), is one component of a comprehensive MRSA colonization surveillance program. It is not intended to diagnose MRSA infection nor to guide or monitor treatment for MRSA infections. Performed at [facility], [place] [address], [place], [place] [address]      Labs: BNP (last 3 results) No results for input(s): BNP in the last 8760 hours. Basic Metabolic Panel: Recent Labs  Lab 09/25/20 0500 09/26/20 0403 09/27/20 0500 09/29/20 0432 09/30/20 0330  NA 134* 134*  137 139 139  K 3.8 3.8 3.8 3.6 3.5  CL 108 108 109 107 108  CO2 22 23 23 26 26   GLUCOSE 95 125* 96 100* 112*  BUN 10 9 9  7* 9  CREATININE 0.67 0.62 0.63 0.61 0.52*  CALCIUM 7.6* 7.7* 7.8* 8.2* 8.0*  MG  --   --   --  1.9 1.7  PHOS  --   --   --  2.6 2.6   Liver Function Tests: Recent Labs  Lab 09/25/20 0500 09/26/20 0403 09/27/20 0500 09/29/20 0432 09/30/20 0330  AST 41 30 27 28  32  ALT 44 38 33 31 31  ALKPHOS 237* 247* 240* 216* 208*  BILITOT 2.2* 2.0* 1.6* 1.7* 1.5*  PROT 4.9* 5.2* 5.1* 5.5* 5.4*  ALBUMIN 1.9* 2.0* 2.0* 2.3* 2.3*   No results for input(s): LIPASE,  AMYLASE in the last 168 hours. No results for input(s): AMMONIA in the last 168 hours. CBC: Recent Labs  Lab 09/25/20 0500 09/26/20 0403 09/27/20 0500 09/29/20 0432 09/30/20 0330  WBC 5.7 4.2 3.6* 3.2* 3.4*  HGB 8.7* 8.7* 8.2* 8.5* 8.5*  HCT 26.0* 26.0* 25.4* 25.8* 25.5*  MCV 85.8 85.8 86.4 86.6 87.3  PLT 166 213 241 267 261   Cardiac Enzymes: No results for input(s): CKTOTAL, CKMB, CKMBINDEX, TROPONINI in the last 168 hours. BNP: Invalid input(s): POCBNP CBG: Recent Labs  Lab 09/28/20 1725 09/29/20 0740 09/29/20 1135 09/29/20 1622 09/30/20 0736  GLUCAP 83 95 101* 74 92   D-Dimer No results for input(s): DDIMER in the last 72 hours. Hgb A1c No results for input(s): HGBA1C in the last 72 hours. Lipid Profile No results for input(s): CHOL, HDL, LDLCALC, TRIG, CHOLHDL, LDLDIRECT in the last 72 hours. Thyroid function studies No results for input(s): TSH, T4TOTAL, T3FREE, THYROIDAB in the last 72 hours.  Invalid input(s): FREET3 Anemia work up No results for input(s): VITAMINB12, FOLATE, FERRITIN, TIBC, IRON, RETICCTPCT in the last 72 hours. Urinalysis    Component Value Date/Time   COLORURINE AMBER (A) 09/21/2020 1445   APPEARANCEUR CLEAR 09/21/2020 1445   LABSPEC 1.020 09/21/2020 1445   PHURINE 5.0 09/21/2020 1445   GLUCOSEU NEGATIVE 09/21/2020 1445   GLUCOSEU NEGATIVE 04/22/2020 1246   HGBUR MODERATE (A) 09/21/2020 1445   HGBUR negative 11/30/2009 1450   BILIRUBINUR NEGATIVE 09/21/2020 1445   KETONESUR NEGATIVE 09/21/2020 1445   PROTEINUR NEGATIVE 09/21/2020 1445   UROBILINOGEN 0.2 04/22/2020 1246   NITRITE NEGATIVE 09/21/2020 1445   LEUKOCYTESUR NEGATIVE 09/21/2020 1445   Sepsis Labs Invalid input(s): PROCALCITONIN,  WBC,  LACTICIDVEN Microbiology Recent Results (from the past 240 hour(s))  Culture, blood (Routine x 2)     Status: Abnormal   Collection Time: 09/21/20 11:26 AM   Specimen: BLOOD  Result Value Ref Range Status   Specimen Description    Final    BLOOD BLOOD LEFT FOREARM Performed at [facility] [address] [place], [place] [address]    Special Requests   Final    BOTTLES DRAWN AEROBIC AND ANAEROBIC Blood Culture results may not be optimal due to an inadequate volume of blood received in culture bottles Performed at [facility] [address] [place], [place] [address]    Culture  Setup Time   Final    GRAM NEGATIVE RODS IN BOTH AEROBIC AND ANAEROBIC BOTTLES CRITICAL RESULT CALLED TO, READ BACK BY AND VERIFIED WITH: PHARMD Joyice Faster [phone] FCP Performed at [facility], [place] [address],  [place], [place] [address]    Culture ENTEROBACTER AEROGENES (A)  Final   Report Status 09/24/2020 FINAL  Final   Organism ID, Bacteria ENTEROBACTER AEROGENES  Final      Susceptibility   Enterobacter aerogenes - MIC*    CEFAZOLIN RESISTANT Resistant     CEFEPIME <=0.12 SENSITIVE Sensitive     CEFTAZIDIME <=1 SENSITIVE Sensitive     CEFTRIAXONE <=0.25 SENSITIVE Sensitive     CIPROFLOXACIN <=0.25 SENSITIVE Sensitive     GENTAMICIN <=1 SENSITIVE Sensitive     IMIPENEM <=0.25 SENSITIVE Sensitive     TRIMETH/SULFA <=20 SENSITIVE Sensitive     PIP/TAZO <=4 SENSITIVE Sensitive     * ENTEROBACTER AEROGENES  Blood Culture ID Panel (Reflexed)     Status: Abnormal   Collection Time: 09/21/20 11:26 AM  Result Value Ref Range Status   Enterococcus faecalis NOT DETECTED NOT DETECTED Final   Enterococcus Faecium NOT DETECTED NOT DETECTED Final   Listeria monocytogenes NOT DETECTED NOT DETECTED Final   Staphylococcus species NOT DETECTED NOT DETECTED Final   Staphylococcus aureus (BCID) NOT DETECTED NOT DETECTED Final   Staphylococcus epidermidis NOT DETECTED NOT DETECTED Final   Staphylococcus lugdunensis NOT DETECTED NOT DETECTED Final   Streptococcus species NOT DETECTED NOT DETECTED Final   Streptococcus agalactiae NOT DETECTED NOT DETECTED Final   Streptococcus pneumoniae NOT  DETECTED NOT DETECTED Final   Streptococcus pyogenes NOT DETECTED NOT DETECTED Final   A.calcoaceticus-baumannii NOT DETECTED NOT DETECTED Final   Bacteroides fragilis NOT DETECTED NOT DETECTED Final   Enterobacterales DETECTED (A) NOT DETECTED Final    Comment: Enterobacterales represent a large order of gram negative bacteria, not a single organism. CRITICAL RESULT CALLED TO, READ BACK BY AND VERIFIED WITH: PHARMD NICK G. [phone] FCP    Enterobacter cloacae complex NOT DETECTED NOT DETECTED Final   Escherichia coli NOT DETECTED NOT DETECTED Final   Klebsiella aerogenes DETECTED (A) NOT DETECTED Final    Comment: CRITICAL RESULT CALLED TO, READ BACK BY AND VERIFIED WITH: PHARMD NICK G. [phone] FCP    Klebsiella oxytoca NOT DETECTED NOT DETECTED Final   Klebsiella pneumoniae NOT DETECTED NOT DETECTED Final   Proteus species NOT DETECTED NOT DETECTED Final   Salmonella species NOT DETECTED NOT DETECTED Final   Serratia marcescens NOT DETECTED NOT DETECTED Final   Haemophilus influenzae NOT DETECTED NOT DETECTED Final   Neisseria meningitidis NOT DETECTED NOT DETECTED Final   Pseudomonas aeruginosa NOT DETECTED NOT DETECTED Final   Stenotrophomonas maltophilia NOT DETECTED NOT DETECTED Final   Candida albicans NOT DETECTED NOT DETECTED Final   Candida auris NOT DETECTED NOT DETECTED Final   Candida glabrata NOT DETECTED NOT DETECTED Final   Candida krusei NOT DETECTED NOT DETECTED Final   Candida parapsilosis NOT DETECTED NOT DETECTED Final   Candida tropicalis NOT DETECTED NOT DETECTED Final   Cryptococcus neoformans/gattii NOT DETECTED NOT DETECTED Final   CTX-M ESBL NOT DETECTED NOT DETECTED Final   Carbapenem resistance IMP NOT DETECTED NOT DETECTED Final   Carbapenem resistance KPC NOT DETECTED NOT DETECTED Final   Carbapenem resistance NDM NOT DETECTED NOT DETECTED Final   Carbapenem resist OXA 48 LIKE NOT DETECTED NOT DETECTED Final   Carbapenem resistance VIM NOT  DETECTED NOT DETECTED Final    Comment: Performed at [facility], [address] [place], [place] [address]  Resp Panel by RT-PCR (Flu A&B, Covid) Nasopharyngeal Swab     Status: None   Collection Time: 09/21/20 11:33 AM  Specimen: Nasopharyngeal Swab; Nasopharyngeal(NP) swabs in vial transport medium  Result Value Ref Range Status   SARS Coronavirus 2 by RT PCR NEGATIVE NEGATIVE Final    Comment: (NOTE) SARS-CoV-2 target nucleic acids are NOT DETECTED.  The SARS-CoV-2 RNA is generally detectable in upper respiratory specimens during the acute phase of infection. The lowest concentration of SARS-CoV-2 viral copies this assay can detect is 138 copies/mL. A negative result does not preclude SARS-Cov-2 infection and should not be used as the sole basis for treatment or other patient management decisions. A negative result may occur with  improper specimen collection/handling, submission of specimen other than nasopharyngeal swab, presence of viral mutation(s) within the areas targeted by this assay, and inadequate number of viral copies(<138 copies/mL). A negative result must be combined with clinical observations, patient history, and epidemiological information. The expected result is Negative.  Fact Sheet for Patients:  EntrepreneurPulse.com.au  Fact Sheet for Healthcare Providers:  IncredibleEmployment.be  This test is no t yet approved or cleared by the [place] FDA and  has been authorized for detection and/or diagnosis of SARS-CoV-2 by FDA under an Emergency Use Authorization (EUA). This EUA will remain  in effect (meaning this test can be used) for the duration of the COVID-19 declaration under Section 564(b)(1) of the Act, 21 U.S.C.section 360bbb-3(b)(1), unless the authorization is terminated  or revoked sooner.       Influenza A by PCR NEGATIVE NEGATIVE Final   Influenza B by PCR NEGATIVE NEGATIVE Final    Comment:  (NOTE) The Xpert Xpress SARS-CoV-2/FLU/RSV plus assay is intended as an aid in the diagnosis of influenza from Nasopharyngeal swab specimens and should not be used as a sole basis for treatment. Nasal washings and aspirates are unacceptable for Xpert Xpress SARS-CoV-2/FLU/RSV testing.  Fact Sheet for Patients: EntrepreneurPulse.com.au  Fact Sheet for Healthcare Providers: IncredibleEmployment.be  This test is not yet approved or cleared by the [place] FDA and has been authorized for detection and/or diagnosis of SARS-CoV-2 by FDA under an Emergency Use Authorization (EUA). This EUA will remain in effect (meaning this test can be used) for the duration of the COVID-19 declaration under Section 564(b)(1) of the Act, 21 U.S.C. section 360bbb-3(b)(1), unless the authorization is terminated or revoked.  Performed at [facility], [place] [address] [place], [place] [address]   Culture, blood (Routine x 2)     Status: Abnormal   Collection Time: 09/21/20 11:42 AM   Specimen: BLOOD  Result Value Ref Range Status   Specimen Description   Final    BLOOD PORTA CATH Performed at [place] [address], [place], [place] [address]    Special Requests   Final    BOTTLES DRAWN AEROBIC AND ANAEROBIC Blood Culture adequate volume Performed at [place] [address], [place], [place] [address]    Culture  Setup Time   Final    GRAM NEGATIVE RODS IN BOTH AEROBIC AND ANAEROBIC BOTTLES    Culture (A)  Final    ENTEROBACTER AEROGENES SUSCEPTIBILITIES PERFORMED ON PREVIOUS CULTURE WITHIN THE LAST 5 DAYS. Performed at [facility], [place] [address], [place], [place] [address]    Report Status 09/24/2020 FINAL  Final  Urine culture     Status: None   Collection Time: 09/21/20  2:45 PM   Specimen: In/Out Cath Urine  Result Value Ref Range Status   Specimen Description   Final     IN/OUT CATH URINE Performed at [facility]  [place] [address] [place], [place] [address]    Special Requests   Final    NONE Performed at [facility], [place] [address], [place], [place] [address]    Culture   Final    NO GROWTH Performed at [facility], [place] [address] [place], [place] [address]    Report Status 09/23/2020 FINAL  Final  Body fluid culture w Gram Stain     Status: None   Collection Time: 09/21/20  3:15 PM   Specimen: BILE; Body Fluid  Result Value Ref Range Status   Specimen Description   Final    BILE Performed at [place] [address], [place], [place] [address]    Special Requests   Final    Immunocompromised Performed at [facility], [place] [address] [place], [place] [address]    Gram Stain   Final    NO WBC SEEN RARE GRAM NEGATIVE RODS Performed at [facility], [place] [address], [place], [place] [address]    Culture   Final    RARE ENTEROBACTER AEROGENES MODERATE CANDIDA ALBICANS    Report Status 09/25/2020 FINAL  Final   Organism ID, Bacteria ENTEROBACTER AEROGENES  Final      Susceptibility   Enterobacter aerogenes - MIC*    CEFAZOLIN RESISTANT Resistant     CEFEPIME <=0.12 SENSITIVE Sensitive     CEFTAZIDIME <=1 SENSITIVE Sensitive     CEFTRIAXONE <=0.25 SENSITIVE Sensitive     CIPROFLOXACIN <=0.25 SENSITIVE Sensitive     GENTAMICIN <=1 SENSITIVE Sensitive     IMIPENEM <=0.25 SENSITIVE Sensitive     TRIMETH/SULFA <=20 SENSITIVE Sensitive     PIP/TAZO <=4 SENSITIVE Sensitive     * RARE ENTEROBACTER AEROGENES  MRSA PCR Screening     Status: None   Collection Time: 09/21/20  4:44 PM  Result Value Ref Range Status   MRSA by PCR NEGATIVE NEGATIVE Final    Comment:        The GeneXpert MRSA Assay (FDA approved for NASAL specimens only), is one component of a comprehensive MRSA colonization surveillance program. It is not intended to diagnose  MRSA infection nor to guide or monitor treatment for MRSA infections. Performed at [facility], [place] [address], [place], [place] [address]      Time coordinating discharge: 35 minutes  SIGNED:   Aline August, MD  [facility] 09/30/2020, 11:45 AM

## 2020-10-01 ENCOUNTER — Other Ambulatory Visit: Payer: Self-pay

## 2020-10-01 ENCOUNTER — Ambulatory Visit
Admission: RE | Admit: 2020-10-01 | Discharge: 2020-10-01 | Disposition: A | Discharge status: Home / Self Care | Payer: Medicare PPO | Source: Ambulatory Visit | Attending: Radiation Oncology | Admitting: Radiation Oncology

## 2020-10-01 DIAGNOSIS — Z510 Encounter for antineoplastic radiation therapy: Secondary | ICD-10-CM | POA: Diagnosis not present

## 2020-10-01 DIAGNOSIS — C772 Secondary and unspecified malignant neoplasm of intra-abdominal lymph nodes: Secondary | ICD-10-CM | POA: Diagnosis not present

## 2020-10-01 DIAGNOSIS — C169 Malignant neoplasm of stomach, unspecified: Secondary | ICD-10-CM | POA: Diagnosis not present

## 2020-10-01 DIAGNOSIS — C162 Malignant neoplasm of body of stomach: Secondary | ICD-10-CM | POA: Diagnosis not present

## 2020-10-02 ENCOUNTER — Ambulatory Visit
Admission: RE | Admit: 2020-10-02 | Discharge: 2020-10-02 | Disposition: A | Discharge status: Home / Self Care | Payer: Medicare PPO | Source: Ambulatory Visit | Attending: Radiation Oncology | Admitting: Radiation Oncology

## 2020-10-02 ENCOUNTER — Ambulatory Visit (HOSPITAL_COMMUNITY)
Admission: RE | Admit: 2020-10-02 | Discharge: 2020-10-02 | Disposition: A | Discharge status: Home / Self Care | Payer: Medicare PPO | Source: Ambulatory Visit | Attending: Interventional Radiology | Admitting: Interventional Radiology

## 2020-10-02 ENCOUNTER — Telehealth: Payer: Self-pay

## 2020-10-02 ENCOUNTER — Other Ambulatory Visit (HOSPITAL_COMMUNITY): Payer: Medicare PPO

## 2020-10-02 ENCOUNTER — Encounter (HOSPITAL_COMMUNITY): Payer: Self-pay

## 2020-10-02 ENCOUNTER — Other Ambulatory Visit (HOSPITAL_COMMUNITY): Payer: Self-pay | Admitting: Interventional Radiology

## 2020-10-02 DIAGNOSIS — C772 Secondary and unspecified malignant neoplasm of intra-abdominal lymph nodes: Secondary | ICD-10-CM | POA: Insufficient documentation

## 2020-10-02 DIAGNOSIS — K831 Obstruction of bile duct: Secondary | ICD-10-CM

## 2020-10-02 DIAGNOSIS — C169 Malignant neoplasm of stomach, unspecified: Secondary | ICD-10-CM | POA: Insufficient documentation

## 2020-10-02 DIAGNOSIS — Z510 Encounter for antineoplastic radiation therapy: Secondary | ICD-10-CM | POA: Diagnosis not present

## 2020-10-02 DIAGNOSIS — C162 Malignant neoplasm of body of stomach: Secondary | ICD-10-CM | POA: Diagnosis not present

## 2020-10-02 NOTE — Progress Notes (Signed)
Pt and family came over to IR after radiation therapy today to review drain care. Both drains looks great. Chole tube with thin bilious output Bili drain capped. Sites clean, NT, no leakage. Re-educated how to empty bag and record output. Reminded them that they can always reconnect Bili drain to bag if leakage or pain or rising Bilirubin when labs checked by primary team. Family and pt left with good understanding of drain care.  Ascencion Dike PA-C Interventional Radiology 10/02/2020 3:16 PM

## 2020-10-02 NOTE — Telephone Encounter (Signed)
Transition Care Management Follow-up Telephone Call Date of discharge and from where: 09/30/2020 from [facility] How have you been since you were released from the hospital? Still weak; patient going through radiation Any questions or concerns? No  Items Reviewed: Did the pt receive and understand the discharge instructions provided? Yes  Medications obtained and verified? Yes  Other? No  Any new allergies since your discharge? No  Dietary orders reviewed? Yes, low sodium heart health diet Do you have support at home? Yes , wife and son  [place] and Equipment/Supplies: Were home health services ordered? no If so, what is the name of the agency? N/a  Has the agency set up a time to come to the patient's home? not applicable Were any new equipment or medical supplies ordered?  No What is the name of the medical supply agency? N/a Were you able to get the supplies/equipment? no Do you have any questions related to the use of the equipment or supplies? No  Functional Questionnaire: (I = Independent and D = Dependent) ADLs: I  Bathing/Dressing- I  Meal Prep- I  Eating- I  Maintaining continence- I  Transferring/Ambulation- I  Managing Meds- I  Follow up appointments reviewed:  PCP Hospital f/u appt confirmed? Yes  Scheduled to see Cathlean Cower, MD on 10/06/2020 @ 1:00 pm. [facility] f/u appt confirmed? Yes  Scheduled to see [facility] Radiation Oncology on 10/02/2020 @ 1:15 pm. Are transportation arrangements needed? No  If their condition worsens, is the pt aware to call PCP or go to the Emergency Dept.? Yes Was the patient provided with contact information for the PCP's office or ED? Yes Was to pt encouraged to call back with questions or concerns? Yes

## 2020-10-06 ENCOUNTER — Ambulatory Visit
Admission: RE | Admit: 2020-10-06 | Discharge: 2020-10-06 | Disposition: A | Discharge status: Home / Self Care | Payer: Medicare PPO | Source: Ambulatory Visit | Attending: Radiation Oncology | Admitting: Radiation Oncology

## 2020-10-06 ENCOUNTER — Other Ambulatory Visit: Payer: Self-pay

## 2020-10-06 ENCOUNTER — Encounter: Payer: Self-pay | Admitting: Internal Medicine

## 2020-10-06 ENCOUNTER — Ambulatory Visit: Payer: Medicare PPO | Admitting: Internal Medicine

## 2020-10-06 VITALS — BP 112/68 | HR 80 | Temp 98.5°F | Ht 68.0 in | Wt 163.0 lb

## 2020-10-06 DIAGNOSIS — C772 Secondary and unspecified malignant neoplasm of intra-abdominal lymph nodes: Secondary | ICD-10-CM | POA: Diagnosis not present

## 2020-10-06 DIAGNOSIS — C169 Malignant neoplasm of stomach, unspecified: Secondary | ICD-10-CM | POA: Diagnosis not present

## 2020-10-06 DIAGNOSIS — C162 Malignant neoplasm of body of stomach: Secondary | ICD-10-CM | POA: Diagnosis not present

## 2020-10-06 DIAGNOSIS — R739 Hyperglycemia, unspecified: Secondary | ICD-10-CM

## 2020-10-06 DIAGNOSIS — Z510 Encounter for antineoplastic radiation therapy: Secondary | ICD-10-CM | POA: Diagnosis not present

## 2020-10-06 LAB — CBC WITH DIFFERENTIAL/PLATELET
Basophils Absolute: 0 10*3/uL (ref 0.0–0.1)
Basophils Relative: 0.5 % (ref 0.0–3.0)
Eosinophils Absolute: 0.3 10*3/uL (ref 0.0–0.7)
Eosinophils Relative: 8.8 % — ABNORMAL HIGH (ref 0.0–5.0)
HCT: 32.5 % — ABNORMAL LOW (ref 39.0–52.0)
Hemoglobin: 10.8 g/dL — ABNORMAL LOW (ref 13.0–17.0)
Lymphocytes Relative: 29.6 % (ref 12.0–46.0)
Lymphs Abs: 1.1 10*3/uL (ref 0.7–4.0)
MCHC: 33.3 g/dL (ref 30.0–36.0)
MCV: 85.7 fl (ref 78.0–100.0)
Monocytes Absolute: 0.6 10*3/uL (ref 0.1–1.0)
Monocytes Relative: 16.2 % — ABNORMAL HIGH (ref 3.0–12.0)
Neutro Abs: 1.6 10*3/uL (ref 1.4–7.7)
Neutrophils Relative %: 44.9 % (ref 43.0–77.0)
Platelets: 219 10*3/uL (ref 150.0–400.0)
RBC: 3.79 Mil/uL — ABNORMAL LOW (ref 4.22–5.81)
RDW: 15 % (ref 11.5–15.5)
WBC: 3.6 10*3/uL — ABNORMAL LOW (ref 4.0–10.5)

## 2020-10-06 LAB — BASIC METABOLIC PANEL
BUN: 11 mg/dL (ref 6–23)
CO2: 28 mEq/L (ref 19–32)
Calcium: 8.8 mg/dL (ref 8.4–10.5)
Chloride: 103 mEq/L (ref 96–112)
Creatinine, Ser: 0.6 mg/dL (ref 0.40–1.50)
GFR: 90.71 mL/min (ref >60.00)
Glucose, Bld: 97 mg/dL (ref 70–99)
Potassium: 4.3 mEq/L (ref 3.5–5.1)
Sodium: 136 mEq/L (ref 135–145)

## 2020-10-06 LAB — HEPATIC FUNCTION PANEL
ALT: 40 U/L (ref 0–53)
AST: 49 U/L — ABNORMAL HIGH (ref 0–37)
Albumin: 3.4 g/dL — ABNORMAL LOW (ref 3.5–5.2)
Alkaline Phosphatase: 254 U/L — ABNORMAL HIGH (ref 39–117)
Bilirubin, Direct: 0.7 mg/dL — ABNORMAL HIGH (ref 0.0–0.3)
Total Bilirubin: 1.5 mg/dL — ABNORMAL HIGH (ref 0.2–1.2)
Total Protein: 6.8 g/dL (ref 6.0–8.3)

## 2020-10-06 NOTE — Progress Notes (Signed)
[name], male   DOB: 1941/04/03, 80 y.o.   MRN: [id number]        Chief Complaint: follow up recent hospn       HPI:  Jonathan Lewis is a 80 y.o. male here after recent hospn 6/20 - 6/29 biliary obstruction secondary to metastatic cancer.  Biliary stent placement was attempted but failed so patient underwent percutaneous drain placement by IR.  He was admitted for septic shock requiring Levophed along with Klebsiella bacteremia requiring IV antibiotics. Blood cultures grew Klebsiella aerogenes  During the hospitalizing, his biliary drainage catheter was capped by IR.  Pt was d/c on 4 more days of bactrim which he has finished.  Has plans for f/u IR   Patient is status post cholecystostomy tube placement and upsizing of biliary drain on 6/23 Patient underwent capping trial for biliary drain on 09/29/20.  LFTs have remained stable/improving  Today pt has felt somewhat warm but no fever, chills, pain and Denies worsening reflux, abd pain, dysphagia, n/v, bowel change or blood. Wife says unable to get through on phone to see Dr Marin Olp post hosp so far, but will keep trying.  No other new complaints Wt Readings from Last 3 Encounters:  10/06/20 163 lb (73.9 kg)  09/24/20 209 lb 7 oz (95 kg)  09/16/20 174 lb 6.4 oz (79.1 kg)   BP Readings from Last 3 Encounters:  10/06/20 112/68  09/30/20 137/84  09/19/20 (!) 149/97      Transitional Care Management elements noted today: 1)  Date of D/C: as above 2)  Medication reconciliation:  done today at end visit 3)  Review of D/C summary or other information:  done today 4)  Review of need for f/u on pending diagnostic tests and treatments:  done today 5)  Review of need for Interaction with other providers who will assume or resume care of pt specific problems: done today 6)  Education of patient/wife done today  Past Medical History:  Diagnosis Date   Acoustic neuroma ([place]) 06/25/2015   Allergic rhinitis 07/18/2014   Allergy     Anemia    Arthritis    BPH (benign prostatic hyperplasia)    Cancer ([place])    stage III stomach-Dx 03/2019   Carbuncle    recurrent MRSA carbuncles   Cataract    Per pt bilateral cataracts removed.   Diabetes mellitus    Type II. Per pt Dr. Jenny Reichmann took him off his dm med.   Disk prolapse    Glaucoma    Hyperlipidemia    Hypertension    Male hypogonadism 07/16/2014   Neuropathy    Pneumonia    Sinus bradycardia    chronic, asymptomatic   Sinusitis 07/29/2012   Past Surgical History:  Procedure Laterality Date   ANTERIOR CERVICAL DECOMPRESSION/DISCECTOMY FUSION 4 LEVELS N/A 01/08/2020   Procedure: ANTERIOR CERVICAL DECOMPRESSION FUSION CERVICAL 3-4, CERVICAL 4-5, CERVIAL 5-6 WITH INSTRUMENTATION AND ALLOGRAFT;  Surgeon: Phylliss Bob, MD;  Location: [place];  Service: Orthopedics;  Laterality: N/A;   BALLOON DILATION N/A 03/12/2019   Procedure: BALLOON DILATION;  Surgeon: Rush Landmark Telford Nab., MD;  Location[facility];  Service: Gastroenterology;  Laterality: N/A;  pyloric   BIOPSY  03/08/2019   Procedure: BIOPSY;  Surgeon: Yetta Flock, MD;  Location: [facility];  Service: Gastroenterology;;   BIOPSY  03/12/2019   Procedure: BIOPSY;  Surgeon: [place] Copas., MD;  Location: [facility];  Service: Gastroenterology;;   CATARACT EXTRACTION  x 2   COLONOSCOPY     ENDOSCOPIC RETROGRADE CHOLANGIOPANCREATOGRAPHY (ERCP) WITH PROPOFOL N/A 09/09/2020   Procedure: ENDOSCOPIC RETROGRADE CHOLANGIOPANCREATOGRAPHY (ERCP) WITH PROPOFOL;  Surgeon: Rush Landmark Telford Nab., MD;  [facility];  Service: Gastroenterology;  Laterality: N/A;   ESOPHAGOGASTRODUODENOSCOPY N/A 03/08/2019   Procedure: ESOPHAGOGASTRODUODENOSCOPY (EGD);  Surgeon: Yetta Flock, MD;  Location: [facility];  Service: Gastroenterology;  Laterality: N/A;   ESOPHAGOGASTRODUODENOSCOPY (EGD) WITH PROPOFOL N/A 03/12/2019   Procedure: ESOPHAGOGASTRODUODENOSCOPY (EGD) WITH PROPOFOL;  Surgeon:  Rush Landmark Telford Nab., MD;  Location: [facility];  Service: Gastroenterology;  Laterality: N/A;   FINE NEEDLE ASPIRATION  03/12/2019   Procedure: FINE NEEDLE ASPIRATION (FNA) LINEAR;  Surgeon: [place] Copas., MD;  [facility];  Service: Gastroenterology;;   FOREIGN BODY REMOVAL  03/08/2019   Procedure: FOREIGN BODY REMOVAL;  Surgeon: Yetta Flock, MD;  [facility];  Service: Gastroenterology;;   IR BILIARY DRAIN PLACEMENT WITH CHOLANGIOGRAM  09/09/2020   IR CHOLANGIOGRAM EXISTING TUBE  09/29/2020   IR CHOLANGIOGRAM EXISTING TUBE  09/29/2020   IR EXCHANGE BILIARY DRAIN  09/24/2020   IR INT EXT BILIARY DRAIN WITH CHOLANGIOGRAM  09/10/2020   IR PATIENT EVAL TECH 0-60 MINS  04/03/2019   IR PERC CHOLECYSTOSTOMY  09/24/2020   LAPAROTOMY N/A 03/19/2019   Procedure: EXPLORATORY LAPAROTOMY, distal GASTRECTOMY AND PLACEMENT OF G AND J TUBE, gastric jejunostomy;  Surgeon: Greer Pickerel, MD;  Location[facility];  Service: General;  Laterality: N/A;   POLYPECTOMY     PORTACATH PLACEMENT N/A 03/26/2019   Procedure: INSERTION PORT-A-CATH;  Surgeon: Michael Boston, MD;  Location: [facility];  Service: General;  Laterality: N/A;   [place] INJECTION  09/09/2020   Procedure: SUBMUCOSAL TATTOO INJECTION;  Surgeon: [place] Copas., MD;  Location: [facility];  Service: Gastroenterology;;   UPPER ESOPHAGEAL ENDOSCOPIC ULTRASOUND (EUS) N/A 03/12/2019   Procedure: UPPER ESOPHAGEAL ENDOSCOPIC ULTRASOUND (EUS);  Surgeon: [place] Copas., MD;  Location: [facility];  Service: Gastroenterology;  Laterality: N/A;   UPPER GASTROINTESTINAL ENDOSCOPY     VIDEO BRONCHOSCOPY WITH ENDOBRONCHIAL ULTRASOUND N/A 05/22/2019   Procedure: VIDEO BRONCHOSCOPY WITH ENDOBRONCHIAL ULTRASOUND;  Surgeon: Garner Nash, DO;  Location: [place];  Service: Thoracic;  Laterality: N/A;    reports that he quit smoking about 42 years ago. His smoking use included cigarettes.  He has a 24.00 pack-year smoking history. He has never used smokeless tobacco. He reports previous alcohol use. He reports that he does not use drugs. family history includes Cancer in his brother and mother; Diabetes in his sister; Hyperlipidemia in his brother; Hypertension in his brother, mother, and sister; Stroke in his brother. No Known Allergies Current Outpatient Medications on File Prior to Visit  Medication Sig Dispense Refill   dorzolamide-timolol (COSOPT) 22.3-6.8 MG/ML ophthalmic solution Place 1 drop into both eyes 2 (two) times daily.     famotidine (PEPCID) 20 MG tablet TAKE 1 TABLET BY MOUTH EVERY DAY 90 tablet 0   latanoprost (XALATAN) 0.005 % ophthalmic solution Place 1 drop into both eyes at bedtime.      lidocaine-prilocaine (EMLA) cream Apply 1 application topically as needed. Place on the port one hour before appointment. 30 g 3   meclizine (ANTIVERT) 12.5 MG tablet TAKE 1 TABLET BY MOUTH 3 TIMES DAILY AS NEEDED FOR DIZZINESS. (Patient taking differently: Take 12.5 mg by mouth daily as needed for dizziness.) 30 tablet 2   Multiple Vitamin (MULTIVITAMIN WITH MINERALS) TABS tablet Take 1  tablet by mouth in the morning. Men's One-A-Day Chewable     ondansetron (ZOFRAN) 8 MG tablet Take 1 tablet (8 mg total) by mouth every 8 (eight) hours as needed for nausea or vomiting. 30 tablet 3   senna-docusate (SENOKOT S) 8.6-50 MG tablet Take 1 tablet by mouth daily. 30 tablet 0   SYSTANE ULTRA 0.4-0.3 % SOLN Place 1 drop into both eyes 3 (three) times daily as needed (dry eyes).     No current facility-administered medications on file prior to visit.        ROS:  All others reviewed and negative.  Objective        PE:  BP 112/68 (BP Location: Right Arm, Patient Position: Sitting, Cuff Size: Normal)   Pulse 80   Temp 98.5 F (36.9 C) (Oral)   Ht 5\' 8"  (1.727 m)   Wt 163 lb (73.9 kg)   SpO2 100%   BMI 24.78 kg/m                 Constitutional: Pt appears in NAD                HENT: Head: NCAT.                Right Ear: External ear normal.                 Left Ear: External ear normal.                Eyes: . Pupils are equal, round, and reactive to light. Conjunctivae and EOM are normal               Nose: without d/c or deformity               Neck: Neck supple. Gross normal ROM               Cardiovascular: Normal rate and regular rhythm.                 Pulmonary/Chest: Effort normal and breath sounds without rales or wheezing.                Abd:  Soft, NT, ND, + BS, no organomegaly               Neurological: Pt is alert. At baseline orientation, motor grossly intact               Skin: Skin is warm. No rashes, no other new lesions, LE edema - none               Psychiatric: Pt behavior is normal without agitation   Micro: none  Cardiac tracings I have personally interpreted today:  none  Pertinent Radiological findings (summarize): none   Lab Results  Component Value Date   WBC 3.6 (L) 10/06/2020   HGB 10.8 (L) 10/06/2020   HCT 32.5 (L) 10/06/2020   PLT 219.0 10/06/2020   GLUCOSE 97 10/06/2020   CHOL 210 (H) 04/22/2020   TRIG 106.0 04/22/2020   HDL 44.00 04/22/2020   LDLDIRECT 121.0 11/07/2017   LDLCALC 145 (H) 04/22/2020   ALT 40 10/06/2020   AST 49 (H) 10/06/2020   NA 136 10/06/2020   K 4.3 10/06/2020   CL 103 10/06/2020   CREATININE 0.60 10/06/2020   BUN 11 10/06/2020   CO2 28 10/06/2020   TSH 3.401 09/14/2020   PSA 1.76 05/14/2018   INR 1.5 (H) 09/24/2020   HGBA1C 6.1 (H) 09/10/2020  MICROALBUR <0.7 04/22/2020   Assessment/Plan:  Jonathan Lewis is a 80 y.o. Black or African American [2] male with  has a past medical history of Acoustic neuroma ([place]) (06/25/2015), Allergic rhinitis (07/18/2014), Allergy, Anemia, Arthritis, BPH (benign prostatic hyperplasia), Cancer ([place]), Carbuncle, Cataract, Diabetes mellitus, Disk prolapse, Glaucoma, Hyperlipidemia, Hypertension, Male hypogonadism (07/16/2014), Neuropathy, Pneumonia, Sinus  bradycardia, and Sinusitis (07/29/2012).  Gastric cancer pT3pN2 s/p distal gastrectomy with [place]th II gastrojejunostomy 03/19/2019 Stable, needs f/u with oncology - will refer as wife unable to get through on phone  Hyperglycemia Lab Results  Component Value Date   HGBA1C 6.1 (H) 09/10/2020   Stable, pt to continue current medical treatment  - diet  Followup: No follow-ups on file.  Cathlean Cower, MD 10/08/2020 7:46 PM [facility]

## 2020-10-06 NOTE — Patient Instructions (Signed)
Please continue all other medications as before, and refills have been done if requested.  Please have the pharmacy call with any other refills you may need.  Please continue your efforts at being more active, low cholesterol diet, and weight control.  You are otherwise up to date with prevention measures today.  Please keep your appointments with your specialists as you may have planned - Dr Marin Olp (I will try to refer again)  Please go to the LAB at the blood drawing area for the tests to be done  You will be contacted by phone if any changes need to be made immediately.  Otherwise, you will receive a letter about your results with an explanation, but please check with MyChart first.  Please remember to sign up for MyChart if you have not done so, as this will be important to you in the future with finding out test results, communicating by private email, and scheduling acute appointments online when needed.  Please make an Appointment to return in 2 months, or sooner if needed

## 2020-10-07 ENCOUNTER — Ambulatory Visit
Admission: RE | Admit: 2020-10-07 | Discharge: 2020-10-07 | Disposition: A | Discharge status: Home / Self Care | Payer: Medicare PPO | Source: Ambulatory Visit | Attending: Radiation Oncology | Admitting: Radiation Oncology

## 2020-10-07 DIAGNOSIS — C169 Malignant neoplasm of stomach, unspecified: Secondary | ICD-10-CM | POA: Diagnosis not present

## 2020-10-07 DIAGNOSIS — C772 Secondary and unspecified malignant neoplasm of intra-abdominal lymph nodes: Secondary | ICD-10-CM | POA: Diagnosis not present

## 2020-10-07 DIAGNOSIS — Z510 Encounter for antineoplastic radiation therapy: Secondary | ICD-10-CM | POA: Diagnosis not present

## 2020-10-07 DIAGNOSIS — C162 Malignant neoplasm of body of stomach: Secondary | ICD-10-CM | POA: Diagnosis not present

## 2020-10-08 ENCOUNTER — Other Ambulatory Visit: Payer: Self-pay

## 2020-10-08 ENCOUNTER — Ambulatory Visit
Admission: RE | Admit: 2020-10-08 | Discharge: 2020-10-08 | Disposition: A | Discharge status: Home / Self Care | Payer: Medicare PPO | Source: Ambulatory Visit | Attending: Radiation Oncology | Admitting: Radiation Oncology

## 2020-10-08 DIAGNOSIS — C169 Malignant neoplasm of stomach, unspecified: Secondary | ICD-10-CM | POA: Diagnosis not present

## 2020-10-08 DIAGNOSIS — C772 Secondary and unspecified malignant neoplasm of intra-abdominal lymph nodes: Secondary | ICD-10-CM | POA: Diagnosis not present

## 2020-10-08 DIAGNOSIS — Z510 Encounter for antineoplastic radiation therapy: Secondary | ICD-10-CM | POA: Diagnosis not present

## 2020-10-08 DIAGNOSIS — C162 Malignant neoplasm of body of stomach: Secondary | ICD-10-CM | POA: Diagnosis not present

## 2020-10-08 NOTE — Assessment & Plan Note (Signed)
Lab Results  Component Value Date   HGBA1C 6.1 (H) 09/10/2020   Stable, pt to continue current medical treatment  - diet

## 2020-10-08 NOTE — Assessment & Plan Note (Signed)
Stable, needs f/u with oncology - will refer as wife unable to get through on phone

## 2020-10-09 ENCOUNTER — Telehealth: Payer: Self-pay | Admitting: *Deleted

## 2020-10-09 ENCOUNTER — Ambulatory Visit
Admission: RE | Admit: 2020-10-09 | Discharge: 2020-10-09 | Disposition: A | Discharge status: Home / Self Care | Payer: Medicare PPO | Source: Ambulatory Visit | Attending: Radiation Oncology | Admitting: Radiation Oncology

## 2020-10-09 DIAGNOSIS — Z510 Encounter for antineoplastic radiation therapy: Secondary | ICD-10-CM | POA: Diagnosis not present

## 2020-10-09 DIAGNOSIS — C169 Malignant neoplasm of stomach, unspecified: Secondary | ICD-10-CM | POA: Diagnosis not present

## 2020-10-09 DIAGNOSIS — C772 Secondary and unspecified malignant neoplasm of intra-abdominal lymph nodes: Secondary | ICD-10-CM | POA: Diagnosis not present

## 2020-10-09 DIAGNOSIS — C162 Malignant neoplasm of body of stomach: Secondary | ICD-10-CM | POA: Diagnosis not present

## 2020-10-09 NOTE — Telephone Encounter (Signed)
Called and lvm on both phones of upcoming appointments - requested callback to confirm

## 2020-10-12 ENCOUNTER — Other Ambulatory Visit: Payer: Self-pay

## 2020-10-12 ENCOUNTER — Ambulatory Visit
Admission: RE | Admit: 2020-10-12 | Discharge: 2020-10-12 | Disposition: A | Discharge status: Home / Self Care | Payer: Medicare PPO | Source: Ambulatory Visit | Attending: Radiation Oncology | Admitting: Radiation Oncology

## 2020-10-12 DIAGNOSIS — Z510 Encounter for antineoplastic radiation therapy: Secondary | ICD-10-CM | POA: Diagnosis not present

## 2020-10-12 DIAGNOSIS — C772 Secondary and unspecified malignant neoplasm of intra-abdominal lymph nodes: Secondary | ICD-10-CM | POA: Diagnosis not present

## 2020-10-12 DIAGNOSIS — C169 Malignant neoplasm of stomach, unspecified: Secondary | ICD-10-CM | POA: Diagnosis not present

## 2020-10-12 DIAGNOSIS — C162 Malignant neoplasm of body of stomach: Secondary | ICD-10-CM | POA: Diagnosis not present

## 2020-10-13 ENCOUNTER — Encounter: Payer: Self-pay | Admitting: Radiation Oncology

## 2020-10-13 ENCOUNTER — Ambulatory Visit
Admission: RE | Admit: 2020-10-13 | Discharge: 2020-10-13 | Disposition: A | Discharge status: Home / Self Care | Payer: Medicare PPO | Source: Ambulatory Visit | Attending: Radiation Oncology | Admitting: Radiation Oncology

## 2020-10-13 DIAGNOSIS — C772 Secondary and unspecified malignant neoplasm of intra-abdominal lymph nodes: Secondary | ICD-10-CM | POA: Diagnosis not present

## 2020-10-13 DIAGNOSIS — C169 Malignant neoplasm of stomach, unspecified: Secondary | ICD-10-CM | POA: Diagnosis not present

## 2020-10-13 DIAGNOSIS — Z510 Encounter for antineoplastic radiation therapy: Secondary | ICD-10-CM | POA: Diagnosis not present

## 2020-10-13 DIAGNOSIS — C162 Malignant neoplasm of body of stomach: Secondary | ICD-10-CM | POA: Diagnosis not present

## 2020-10-19 ENCOUNTER — Telehealth: Payer: Self-pay

## 2020-10-19 NOTE — Telephone Encounter (Signed)
Patient wife called to move appointments around.

## 2020-10-20 ENCOUNTER — Ambulatory Visit: Payer: Medicare PPO | Admitting: Internal Medicine

## 2020-10-22 ENCOUNTER — Telehealth: Payer: Self-pay | Admitting: *Deleted

## 2020-10-22 ENCOUNTER — Inpatient Hospital Stay: Payer: Medicare PPO

## 2020-10-22 ENCOUNTER — Encounter: Payer: Self-pay | Admitting: Hematology & Oncology

## 2020-10-22 ENCOUNTER — Inpatient Hospital Stay: Payer: Medicare PPO | Attending: Hematology & Oncology

## 2020-10-22 ENCOUNTER — Inpatient Hospital Stay (HOSPITAL_BASED_OUTPATIENT_CLINIC_OR_DEPARTMENT_OTHER): Payer: Medicare PPO | Admitting: Hematology & Oncology

## 2020-10-22 ENCOUNTER — Other Ambulatory Visit: Payer: Self-pay

## 2020-10-22 VITALS — BP 104/63 | HR 95 | Temp 98.6°F | Resp 19 | Wt 160.0 lb

## 2020-10-22 DIAGNOSIS — D508 Other iron deficiency anemias: Secondary | ICD-10-CM | POA: Insufficient documentation

## 2020-10-22 DIAGNOSIS — C169 Malignant neoplasm of stomach, unspecified: Secondary | ICD-10-CM | POA: Insufficient documentation

## 2020-10-22 DIAGNOSIS — C160 Malignant neoplasm of cardia: Secondary | ICD-10-CM | POA: Diagnosis not present

## 2020-10-22 DIAGNOSIS — C163 Malignant neoplasm of pyloric antrum: Secondary | ICD-10-CM

## 2020-10-22 LAB — CBC WITH DIFFERENTIAL (CANCER CENTER ONLY)
Abs Immature Granulocytes: 0.02 10*3/uL (ref 0.00–0.07)
Basophils Absolute: 0 10*3/uL (ref 0.0–0.1)
Basophils Relative: 0 %
Eosinophils Absolute: 0 10*3/uL (ref 0.0–0.5)
Eosinophils Relative: 1 %
HCT: 31.9 % — ABNORMAL LOW (ref 39.0–52.0)
Hemoglobin: 10.8 g/dL — ABNORMAL LOW (ref 13.0–17.0)
Immature Granulocytes: 0 %
Lymphocytes Relative: 27 %
Lymphs Abs: 1.3 10*3/uL (ref 0.7–4.0)
MCH: 29 pg (ref 26.0–34.0)
MCHC: 33.9 g/dL (ref 30.0–36.0)
MCV: 85.8 fL (ref 80.0–100.0)
Monocytes Absolute: 0.6 10*3/uL (ref 0.1–1.0)
Monocytes Relative: 13 %
Neutro Abs: 2.8 10*3/uL (ref 1.7–7.7)
Neutrophils Relative %: 59 %
Platelet Count: 175 10*3/uL (ref 150–400)
RBC: 3.72 MIL/uL — ABNORMAL LOW (ref 4.22–5.81)
RDW: 14.4 % (ref 11.5–15.5)
WBC Count: 4.7 10*3/uL (ref 4.0–10.5)
nRBC: 0 % (ref 0.0–0.2)

## 2020-10-22 LAB — CMP (CANCER CENTER ONLY)
ALT: 30 U/L (ref 0–44)
AST: 40 U/L (ref 15–41)
Albumin: 3.2 g/dL — ABNORMAL LOW (ref 3.5–5.0)
Alkaline Phosphatase: 259 U/L — ABNORMAL HIGH (ref 38–126)
Anion gap: 5 (ref 5–15)
BUN: 12 mg/dL (ref 8–23)
CO2: 27 mmol/L (ref 22–32)
Calcium: 9.1 mg/dL (ref 8.9–10.3)
Chloride: 99 mmol/L (ref 98–111)
Creatinine: 0.65 mg/dL (ref 0.61–1.24)
GFR, Estimated: >60 mL/min (ref >60)
Glucose, Bld: 136 mg/dL — ABNORMAL HIGH (ref 70–99)
Potassium: 4 mmol/L (ref 3.5–5.1)
Sodium: 131 mmol/L — ABNORMAL LOW (ref 135–145)
Total Bilirubin: 1 mg/dL (ref 0.3–1.2)
Total Protein: 7 g/dL (ref 6.5–8.1)

## 2020-10-22 LAB — LACTATE DEHYDROGENASE: LDH: 142 U/L (ref 98–192)

## 2020-10-22 NOTE — Progress Notes (Signed)
Hematology and Oncology Follow Up Visit  Jonathan Lewis [id number] Mar 08, 1941 80 y.o. 10/22/2020   Principle Diagnosis:  Stage IIIA (T3N2M0) adenocarcinoma of the stomach --  S/p distal gastrectomy on 03/19/2019  -- HER2-/PD-L1+ -- metastatic   Current Therapy:    Taxol/Cyramza -- start cycle #1 on 07/20/2020 - Cyramza d/c due to GI bleed. XRT -- start on 09/18/2020      Adjuvant FOLFOX - started on 05/13/2019, s/p cycle #8 -completed on Dec 11, 2019   Interim History:  Jonathan Lewis is here today for follow-up.  He completed radiation therapy about a week ago.  Unfortunately, the insurance would not let us use gemcitabine with the radiation.  He still has a biliary tube in.  His bilirubin is 1 now.  Hopefully, he will be able to have the drain internalized.  This really would be nice for him.  The renal function is doing okay.  Have to believe that the tumor is shrinking.  Again his numbers look a lot better.  His weight is down.  I told him that he can certainly liberalize his diet.  He is able to swallow.  He can eat what he would like.  He has had no problems with pain.  There is no bleeding.  Has had no problems with bowels or bladder.  There is no diarrhea.  He has had no issues with leg swelling.  He has had no rashes.  Currently, I would have to say his performance status is probably ECOG 2.  Medications:  Allergies as of 10/22/2020   No Known Allergies      Medication List        Accurate as of October 22, 2020  1:07 PM. If you have any questions, ask your nurse or doctor.          dorzolamide-timolol 22.3-6.8 MG/ML ophthalmic solution Commonly known as: COSOPT Place 1 drop into both eyes 2 (two) times daily.   famotidine 20 MG tablet Commonly known as: PEPCID TAKE 1 TABLET BY MOUTH EVERY DAY   latanoprost 0.005 % ophthalmic solution Commonly known as: XALATAN Place 1 drop into both eyes at bedtime.   lidocaine-prilocaine cream Commonly known as:  EMLA Apply 1 application topically as needed. Place on the port one hour before appointment.   meclizine 12.5 MG tablet Commonly known as: ANTIVERT TAKE 1 TABLET BY MOUTH 3 TIMES DAILY AS NEEDED FOR DIZZINESS. What changed: See the new instructions.   multivitamin with minerals Tabs tablet Take 1 tablet by mouth in the morning. Men's One-A-Day Chewable   ondansetron 8 MG tablet Commonly known as: ZOFRAN Take 1 tablet (8 mg total) by mouth every 8 (eight) hours as needed for nausea or vomiting.   senna-docusate 8.6-50 MG tablet Commonly known as: Senokot S Take 1 tablet by mouth daily.   Systane Ultra 0.4-0.3 % Soln Generic drug: Polyethyl Glycol-Propyl Glycol Place 1 drop into both eyes 3 (three) times daily as needed (dry eyes).        Allergies: No Known Allergies  Past Medical History, Surgical history, Social history, and Family History were reviewed and updated.  Review of Systems: Review of Systems  Constitutional: Negative.   HENT: Negative.    Eyes: Negative.   Respiratory: Negative.    Cardiovascular: Negative.   Gastrointestinal:  Positive for abdominal pain.  Genitourinary: Negative.   Musculoskeletal: Negative.   Skin: Negative.   Neurological:  Positive for tingling.  Endo/Heme/Allergies: Negative.   Psychiatric/Behavioral: Negative.  Physical Exam:  weight is 160 lb (72.6 kg). His oral temperature is 98.6 F (37 C). His blood pressure is 104/63 and his pulse is 95. His respiration is 19 and oxygen saturation is 100%.   Wt Readings from Last 3 Encounters:  10/22/20 160 lb (72.6 kg)  10/06/20 163 lb (73.9 kg)  09/24/20 209 lb 7 oz (95 kg)    Physical Activity: Not on file    Physical Exam Vitals reviewed.  HENT:     Head: Normocephalic and atraumatic.  Eyes:     Pupils: Pupils are equal, round, and reactive to light.  Cardiovascular:     Rate and Rhythm: Normal rate and regular rhythm.     Heart sounds: Normal heart sounds.   Pulmonary:     Effort: Pulmonary effort is normal.     Breath sounds: Normal breath sounds.  Abdominal:     General: Bowel sounds are normal.     Palpations: Abdomen is soft.     Comments: Abdominal exam is soft.  He has well-healed laparotomy scars.  There is no tenderness to palpation.  He has no guarding or rebound tenderness.  There is no fluid wave.  There is no palpable liver or spleen tip.  Genitourinary:    Comments: On his rectal exam, he does have an external hemorrhoid.  This is not thrombosed or bleeding.  His rectal vault is smooth.  He has very little stool.  It is heme positive. Musculoskeletal:        General: No tenderness or deformity. Normal range of motion.     Cervical back: Normal range of motion.  Lymphadenopathy:     Cervical: No cervical adenopathy.  Skin:    General: Skin is warm and dry.     Findings: No erythema or rash.  Neurological:     Mental Status: He is alert and oriented to person, place, and time.  Psychiatric:        Behavior: Behavior normal.        Thought Content: Thought content normal.        Judgment: Judgment normal.    Lab Results  Component Value Date   WBC 4.7 10/22/2020   HGB 10.8 (L) 10/22/2020   HCT 31.9 (L) 10/22/2020   MCV 85.8 10/22/2020   PLT 175 10/22/2020   Lab Results  Component Value Date   FERRITIN 1,251 (H) 09/04/2020   IRON 108 09/04/2020   TIBC 296 09/04/2020   UIBC 189 09/04/2020   IRONPCTSAT 36 09/04/2020   Lab Results  Component Value Date   RETICCTPCT 1.6 09/04/2020   RBC 3.72 (L) 10/22/2020   No results found for: KPAFRELGTCHN, LAMBDASER, KAPLAMBRATIO No results found for: IGGSERUM, IGA, IGMSERUM No results found for: Ronnald Ramp, A1GS, A2GS, Violet Baldy, MSPIKE, SPEI   Chemistry      Component Value Date/Time   NA 131 (L) 10/22/2020 1152   K 4.0 10/22/2020 1152   CL 99 10/22/2020 1152   CO2 27 10/22/2020 1152   BUN 12 10/22/2020 1152   CREATININE 0.65 10/22/2020 1152    CREATININE 1.06 07/16/2012 1038      Component Value Date/Time   CALCIUM 9.1 10/22/2020 1152   ALKPHOS 259 (H) 10/22/2020 1152   AST 40 10/22/2020 1152   ALT 30 10/22/2020 1152   BILITOT 1.0 10/22/2020 1152       Impression and Plan: Jonathan Lewis is a very pleasant 80 yo African American gentleman with stage IIIA (T3N2M0) adenocarcinoma of the  stomach, HER2-/PD-L1+. He had a distal gastrectomy on 03/19/2019 and was treated with adjuvant  FOLFOX.   He developed metastatic disease.  We tried him on 1 cycle of Taxol/Cyramza.  He developed a lot of jaundice.  He then went into the hospital.  He had a biliary stent placed.  He started radiation therapy.  It is still too early to tell how things have gone with radiation.  I probably would wait about a month and then we would repeat his MRI.  For right now, I think we does have to watch him.  I do still think he is going to do well with systemic chemotherapy.  I just worry about his performance status and his quality of life would be decreased.  I would like to see him back in about 2 weeks or so.    Volanda Napoleon, MD 7/21/20221:07 PM

## 2020-10-22 NOTE — Telephone Encounter (Signed)
Per 10/22/20 los gave patient's wife upcoming appointments - confirmed and gave printed updated calendar

## 2020-10-22 NOTE — Patient Instructions (Signed)

## 2020-10-23 LAB — IRON AND TIBC
Iron: 27 ug/dL — ABNORMAL LOW (ref 45–182)
Saturation Ratios: 11 % — ABNORMAL LOW (ref 17.9–39.5)
TIBC: 241 ug/dL — ABNORMAL LOW (ref 250–450)
UIBC: 214 ug/dL

## 2020-10-23 LAB — FERRITIN: Ferritin: 653 ng/mL — ABNORMAL HIGH (ref 24–336)

## 2020-10-26 ENCOUNTER — Telehealth: Payer: Self-pay

## 2020-10-26 ENCOUNTER — Encounter: Payer: Self-pay | Admitting: Hematology & Oncology

## 2020-10-27 ENCOUNTER — Inpatient Hospital Stay: Payer: Medicare PPO

## 2020-10-27 ENCOUNTER — Other Ambulatory Visit: Payer: Self-pay | Admitting: *Deleted

## 2020-10-27 ENCOUNTER — Other Ambulatory Visit: Payer: Self-pay

## 2020-10-27 ENCOUNTER — Inpatient Hospital Stay (HOSPITAL_BASED_OUTPATIENT_CLINIC_OR_DEPARTMENT_OTHER): Payer: Medicare PPO | Admitting: Hematology & Oncology

## 2020-10-27 ENCOUNTER — Ambulatory Visit: Payer: Medicare PPO

## 2020-10-27 ENCOUNTER — Encounter: Payer: Self-pay | Admitting: Hematology & Oncology

## 2020-10-27 VITALS — BP 90/65 | HR 82 | Temp 98.0°F | Resp 18

## 2020-10-27 VITALS — BP 110/76 | HR 84 | Temp 98.2°F | Resp 18 | Wt 160.1 lb

## 2020-10-27 DIAGNOSIS — C162 Malignant neoplasm of body of stomach: Secondary | ICD-10-CM | POA: Diagnosis not present

## 2020-10-27 DIAGNOSIS — C161 Malignant neoplasm of fundus of stomach: Secondary | ICD-10-CM

## 2020-10-27 DIAGNOSIS — Z95828 Presence of other vascular implants and grafts: Secondary | ICD-10-CM

## 2020-10-27 DIAGNOSIS — D508 Other iron deficiency anemias: Secondary | ICD-10-CM | POA: Diagnosis not present

## 2020-10-27 DIAGNOSIS — K921 Melena: Secondary | ICD-10-CM

## 2020-10-27 DIAGNOSIS — D509 Iron deficiency anemia, unspecified: Secondary | ICD-10-CM

## 2020-10-27 DIAGNOSIS — E440 Moderate protein-calorie malnutrition: Secondary | ICD-10-CM

## 2020-10-27 DIAGNOSIS — C160 Malignant neoplasm of cardia: Secondary | ICD-10-CM

## 2020-10-27 DIAGNOSIS — C169 Malignant neoplasm of stomach, unspecified: Secondary | ICD-10-CM | POA: Diagnosis not present

## 2020-10-27 LAB — CBC WITH DIFFERENTIAL (CANCER CENTER ONLY)
Abs Immature Granulocytes: 0.01 10*3/uL (ref 0.00–0.07)
Basophils Absolute: 0 10*3/uL (ref 0.0–0.1)
Basophils Relative: 0 %
Eosinophils Absolute: 0 10*3/uL (ref 0.0–0.5)
Eosinophils Relative: 1 %
HCT: 31.7 % — ABNORMAL LOW (ref 39.0–52.0)
Hemoglobin: 10.4 g/dL — ABNORMAL LOW (ref 13.0–17.0)
Immature Granulocytes: 0 %
Lymphocytes Relative: 34 %
Lymphs Abs: 1.4 10*3/uL (ref 0.7–4.0)
MCH: 28.3 pg (ref 26.0–34.0)
MCHC: 32.8 g/dL (ref 30.0–36.0)
MCV: 86.4 fL (ref 80.0–100.0)
Monocytes Absolute: 0.5 10*3/uL (ref 0.1–1.0)
Monocytes Relative: 12 %
Neutro Abs: 2.2 10*3/uL (ref 1.7–7.7)
Neutrophils Relative %: 53 %
Platelet Count: 159 10*3/uL (ref 150–400)
RBC: 3.67 MIL/uL — ABNORMAL LOW (ref 4.22–5.81)
RDW: 14.5 % (ref 11.5–15.5)
WBC Count: 4.2 10*3/uL (ref 4.0–10.5)
nRBC: 0 % (ref 0.0–0.2)

## 2020-10-27 LAB — CMP (CANCER CENTER ONLY)
ALT: 27 U/L (ref 0–44)
AST: 37 U/L (ref 15–41)
Albumin: 3 g/dL — ABNORMAL LOW (ref 3.5–5.0)
Alkaline Phosphatase: 257 U/L — ABNORMAL HIGH (ref 38–126)
Anion gap: 6 (ref 5–15)
BUN: 10 mg/dL (ref 8–23)
CO2: 27 mmol/L (ref 22–32)
Calcium: 9.2 mg/dL (ref 8.9–10.3)
Chloride: 100 mmol/L (ref 98–111)
Creatinine: 0.63 mg/dL (ref 0.61–1.24)
GFR, Estimated: >60 mL/min (ref >60)
Glucose, Bld: 120 mg/dL — ABNORMAL HIGH (ref 70–99)
Potassium: 4.2 mmol/L (ref 3.5–5.1)
Sodium: 133 mmol/L — ABNORMAL LOW (ref 135–145)
Total Bilirubin: 0.8 mg/dL (ref 0.3–1.2)
Total Protein: 6.7 g/dL (ref 6.5–8.1)

## 2020-10-27 LAB — SAMPLE TO BLOOD BANK

## 2020-10-27 MED ORDER — SODIUM CHLORIDE 0.9 % IV SOLN
200.0000 mg | Freq: Once | INTRAVENOUS | Status: AC
  Administered 2020-10-27: 200 mg via INTRAVENOUS
  Filled 2020-10-27: qty 200

## 2020-10-27 MED ORDER — SODIUM CHLORIDE 0.9 % IV SOLN
Freq: Once | INTRAVENOUS | Status: AC
  Filled 2020-10-27: qty 250

## 2020-10-27 MED ORDER — HEPARIN SOD (PORK) LOCK FLUSH 100 UNIT/ML IV SOLN
500.0000 [IU] | Freq: Once | INTRAVENOUS | Status: AC
  Administered 2020-10-27: 500 [IU] via INTRAVENOUS
  Filled 2020-10-27: qty 5

## 2020-10-27 MED ORDER — SODIUM CHLORIDE 0.9% FLUSH
10.0000 mL | Freq: Once | INTRAVENOUS | Status: AC
  Administered 2020-10-27: 10 mL via INTRAVENOUS
  Filled 2020-10-27: qty 10

## 2020-10-27 NOTE — Patient Instructions (Signed)

## 2020-10-27 NOTE — Patient Instructions (Signed)

## 2020-10-27 NOTE — Progress Notes (Signed)
                                                                                                                                                             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 He did have nausea and some constipation during treatment.  Plan: The patient will receive a call in about one month from the radiation oncology department. He will continue follow up with Dr. Marin Olp as well.   ________________________________________________    Carola Rhine, [facility]

## 2020-10-28 ENCOUNTER — Encounter: Payer: Self-pay | Admitting: Hematology & Oncology

## 2020-10-28 ENCOUNTER — Telehealth: Payer: Self-pay

## 2020-10-28 NOTE — Addendum Note (Signed)
Addended by: Burney Gauze R on: 10/28/2020 05:20 PM   Modules accepted: Orders

## 2020-10-28 NOTE — Telephone Encounter (Signed)
No 10/27/20 LOS noted   Morine Kohlman

## 2020-10-28 NOTE — Progress Notes (Signed)
Hematology and Oncology Follow Up Visit  Jonathan Lewis [id number] [birth year] 80 y.o. 10/28/2020   Principle Diagnosis:  Stage IIIA (T3N2M0) adenocarcinoma of the stomach --  S/p distal gastrectomy on 03/19/2019  -- HER2-/PD-L1+ -- metastatic   Current Therapy:    Taxol/Cyramza -- start cycle #1 on 07/20/2020 - Cyramza d/c due to GI bleed. XRT -- start on 09/18/2020      Adjuvant FOLFOX - started on 05/13/2019, s/p cycle #8 -completed on Dec 11, 2019   Interim History:  Mr. Stirewalt is here today for an unscheduled visit.  He has been having more in the way of abdominal pain.  He is not eating as much.  He still has a biliary catheter is in.  This is still draining bile but not that much.  The bile is clear.  I think were going to have to get a CT scan to see what kind of response we had with the radiation therapy.  I would like to believe that he has had a response.  I suspect that his cancer is progressing elsewhere.  He has not had chemotherapy for a couple months.  His performance status is probably ECOG 2.  As such, were not can be able to be all that aggressive with respect to his therapy.  He has had no bleeding.  He has had no fever.  There has been some nausea.  He may have had an occasional episode of vomiting.  There is no diarrhea.  Currently, I would say performance status, again, is probably ECOG 2.  Medications:  Allergies as of 10/27/2020   No Known Allergies      Medication List        Accurate as of October 27, 2020 11:59 PM. If you have any questions, ask your nurse or doctor.          dorzolamide-timolol 22.3-6.8 MG/ML ophthalmic solution Commonly known as: COSOPT Place 1 drop into both eyes 2 (two) times daily.   famotidine 20 MG tablet Commonly known as: PEPCID TAKE 1 TABLET BY MOUTH EVERY DAY   latanoprost 0.005 % ophthalmic solution Commonly known as: XALATAN Place 1 drop into both eyes at bedtime.   lidocaine-prilocaine cream Commonly  known as: EMLA Apply 1 application topically as needed. Place on the port one hour before appointment.   meclizine 12.5 MG tablet Commonly known as: ANTIVERT TAKE 1 TABLET BY MOUTH 3 TIMES DAILY AS NEEDED FOR DIZZINESS. What changed: See the new instructions.   multivitamin with minerals Tabs tablet Take 1 tablet by mouth in the morning. Men's One-A-Day Chewable   ondansetron 8 MG tablet Commonly known as: ZOFRAN Take 1 tablet (8 mg total) by mouth every 8 (eight) hours as needed for nausea or vomiting.   senna-docusate 8.6-50 MG tablet Commonly known as: Senokot S Take 1 tablet by mouth daily.   Systane Ultra 0.4-0.3 % Soln Generic drug: Polyethyl Glycol-Propyl Glycol Place 1 drop into both eyes 3 (three) times daily as needed (dry eyes).        Allergies: No Known Allergies  Past Medical History, Surgical history, Social history, and Family History were reviewed and updated.  Review of Systems: Review of Systems  Constitutional: Negative.   HENT: Negative.    Eyes: Negative.   Respiratory: Negative.    Cardiovascular: Negative.   Gastrointestinal:  Positive for abdominal pain.  Genitourinary: Negative.   Musculoskeletal: Negative.   Skin: Negative.   Neurological:  Positive for tingling.  Endo/Heme/Allergies: Negative.  Psychiatric/Behavioral: Negative.      Physical Exam:  weight is 160 lb 1.6 oz (72.6 kg). His oral temperature is 98.2 F (36.8 C). His blood pressure is 110/76 and his pulse is 84. His respiration is 18 and oxygen saturation is 89% (abnormal).   Wt Readings from Last 3 Encounters:  10/27/20 160 lb 1.6 oz (72.6 kg)  10/27/20 160 lb 1 oz (72.6 kg)  10/22/20 160 lb (72.6 kg)    Physical Activity: Not on file    Physical Exam Vitals reviewed.  HENT:     Head: Normocephalic and atraumatic.  Eyes:     Pupils: Pupils are equal, round, and reactive to light.  Cardiovascular:     Rate and Rhythm: Normal rate and regular rhythm.     Heart  sounds: Normal heart sounds.  Pulmonary:     Effort: Pulmonary effort is normal.     Breath sounds: Normal breath sounds.  Abdominal:     General: Bowel sounds are normal.     Palpations: Abdomen is soft.     Comments: Abdominal exam is soft.  He has well-healed laparotomy scars.  There is no tenderness to palpation.  He has no guarding or rebound tenderness.  There is no fluid wave.  There is no palpable liver or spleen tip.  Genitourinary:    Comments: On his rectal exam, he does have an external hemorrhoid.  This is not thrombosed or bleeding.  His rectal vault is smooth.  He has very little stool.  It is heme positive. Musculoskeletal:        General: No tenderness or deformity. Normal range of motion.     Cervical back: Normal range of motion.  Lymphadenopathy:     Cervical: No cervical adenopathy.  Skin:    General: Skin is warm and dry.     Findings: No erythema or rash.  Neurological:     Mental Status: He is alert and oriented to person, place, and time.  Psychiatric:        Behavior: Behavior normal.        Thought Content: Thought content normal.        Judgment: Judgment normal.    Lab Results  Component Value Date   WBC 4.2 10/27/2020   HGB 10.4 (L) 10/27/2020   HCT 31.7 (L) 10/27/2020   MCV 86.4 10/27/2020   PLT 159 10/27/2020   Lab Results  Component Value Date   FERRITIN 653 (H) 10/22/2020   IRON 27 (L) 10/22/2020   TIBC 241 (L) 10/22/2020   UIBC 214 10/22/2020   IRONPCTSAT 11 (L) 10/22/2020   Lab Results  Component Value Date   RETICCTPCT 1.6 09/04/2020   RBC 3.67 (L) 10/27/2020   No results found for: KPAFRELGTCHN, LAMBDASER, KAPLAMBRATIO No results found for: IGGSERUM, IGA, IGMSERUM No results found for: Ronnald Ramp, A1GS, A2GS, Violet Baldy, MSPIKE, SPEI   Chemistry      Component Value Date/Time   NA 133 (L) 10/27/2020 1048   K 4.2 10/27/2020 1048   CL 100 10/27/2020 1048   CO2 27 10/27/2020 1048   BUN 10 10/27/2020  1048   CREATININE 0.63 10/27/2020 1048   CREATININE 1.06 07/16/2012 1038      Component Value Date/Time   CALCIUM 9.2 10/27/2020 1048   ALKPHOS 257 (H) 10/27/2020 1048   AST 37 10/27/2020 1048   ALT 27 10/27/2020 1048   BILITOT 0.8 10/27/2020 1048       Impression and Plan: Mr. Clairmont is  a very pleasant 80 yo [place] American gentleman with stage IIIA (T3N2M0) adenocarcinoma of the stomach, HER2-/PD-L1+. He had a distal gastrectomy on 03/19/2019 and was treated with adjuvant  FOLFOX.   He developed metastatic disease.  He had bleeding when he tried him on Taxol/Cyramza.  I am sure that the Cyramza was probably the source of the bleeding.  He then had biliary obstruction from a periportal lymph node.  He then had radiation therapy for this.  The jaundice resolved.  His bilirubin normalized.  Again, he is not in the best shape in the world right now.  I forgot to mention that he did get some IV iron today.  I suppose we could try him on chemotherapy and try immunotherapy.  Maybe carboplatinum/Taxol would not be a bad idea for him.  We can try to use immunotherapy along with this.  I talked to him about this.  He understands that this is a problem that cannot be cured.    He is willing to try treatment.  I will have to try to get this set up for him in a week or so.  Hopefully, insurance will cover all of this.  I know Mr. Jeancharles is trying hard.  I will try to get everything set up for him and a week or so.  If there are any problems, we will be more than happy to get him back sooner.   Volanda Napoleon, MD 7/27/20227:22 AM

## 2020-10-29 ENCOUNTER — Telehealth: Payer: Self-pay

## 2020-10-29 NOTE — Progress Notes (Unsigned)
Pharmacist Chemotherapy Monitoring - Initial Assessment    Anticipated start date: 11/05/20   The following has been reviewed per standard work regarding the patient's treatment regimen: The patient's diagnosis, treatment plan and drug doses, and organ/hematologic function Lab orders and baseline tests specific to treatment regimen  The treatment plan start date, drug sequencing, and pre-medications Prior authorization status  Patient's documented medication list, including drug-drug interaction screen and prescriptions for anti-emetics and supportive care specific to the treatment regimen The drug concentrations, fluid compatibility, administration routes, and timing of the medications to be used The patient's access for treatment and lifetime cumulative dose history, if applicable  The patient's medication allergies and previous infusion related reactions, if applicable   Changes made to treatment plan:  N/A  Follow up needed:  Pending authorization for treatment    Claybon Jabs, [facility], 10/29/2020  2:33 PM

## 2020-10-30 ENCOUNTER — Other Ambulatory Visit (HOSPITAL_COMMUNITY): Payer: Self-pay | Admitting: Radiology

## 2020-10-30 ENCOUNTER — Other Ambulatory Visit: Payer: Self-pay

## 2020-10-30 ENCOUNTER — Ambulatory Visit (HOSPITAL_COMMUNITY)
Admission: RE | Admit: 2020-10-30 | Discharge: 2020-10-30 | Disposition: A | Discharge status: Home / Self Care | Payer: Medicare PPO | Source: Ambulatory Visit | Attending: Radiology | Admitting: Radiology

## 2020-10-30 DIAGNOSIS — K8309 Other cholangitis: Secondary | ICD-10-CM | POA: Diagnosis present

## 2020-10-30 DIAGNOSIS — R109 Unspecified abdominal pain: Secondary | ICD-10-CM | POA: Diagnosis not present

## 2020-10-30 DIAGNOSIS — E861 Hypovolemia: Secondary | ICD-10-CM | POA: Diagnosis present

## 2020-10-30 DIAGNOSIS — N400 Enlarged prostate without lower urinary tract symptoms: Secondary | ICD-10-CM | POA: Diagnosis present

## 2020-10-30 DIAGNOSIS — Z87891 Personal history of nicotine dependence: Secondary | ICD-10-CM | POA: Diagnosis not present

## 2020-10-30 DIAGNOSIS — C772 Secondary and unspecified malignant neoplasm of intra-abdominal lymph nodes: Secondary | ICD-10-CM

## 2020-10-30 DIAGNOSIS — K831 Obstruction of bile duct: Secondary | ICD-10-CM

## 2020-10-30 DIAGNOSIS — R509 Fever, unspecified: Secondary | ICD-10-CM | POA: Diagnosis present

## 2020-10-30 DIAGNOSIS — I5042 Chronic combined systolic (congestive) and diastolic (congestive) heart failure: Secondary | ICD-10-CM | POA: Diagnosis present

## 2020-10-30 DIAGNOSIS — C799 Secondary malignant neoplasm of unspecified site: Secondary | ICD-10-CM | POA: Diagnosis not present

## 2020-10-30 DIAGNOSIS — Z823 Family history of stroke: Secondary | ICD-10-CM | POA: Diagnosis not present

## 2020-10-30 DIAGNOSIS — E119 Type 2 diabetes mellitus without complications: Secondary | ICD-10-CM | POA: Diagnosis present

## 2020-10-30 DIAGNOSIS — Z8249 Family history of ischemic heart disease and other diseases of the circulatory system: Secondary | ICD-10-CM | POA: Diagnosis not present

## 2020-10-30 DIAGNOSIS — E785 Hyperlipidemia, unspecified: Secondary | ICD-10-CM | POA: Diagnosis present

## 2020-10-30 DIAGNOSIS — Z903 Acquired absence of stomach [part of]: Secondary | ICD-10-CM | POA: Diagnosis not present

## 2020-10-30 DIAGNOSIS — Z809 Family history of malignant neoplasm, unspecified: Secondary | ICD-10-CM | POA: Diagnosis not present

## 2020-10-30 DIAGNOSIS — C162 Malignant neoplasm of body of stomach: Secondary | ICD-10-CM | POA: Diagnosis not present

## 2020-10-30 DIAGNOSIS — Z833 Family history of diabetes mellitus: Secondary | ICD-10-CM | POA: Diagnosis not present

## 2020-10-30 DIAGNOSIS — C786 Secondary malignant neoplasm of retroperitoneum and peritoneum: Secondary | ICD-10-CM | POA: Diagnosis present

## 2020-10-30 DIAGNOSIS — Z20822 Contact with and (suspected) exposure to covid-19: Secondary | ICD-10-CM | POA: Diagnosis present

## 2020-10-30 DIAGNOSIS — Z83438 Family history of other disorder of lipoprotein metabolism and other lipidemia: Secondary | ICD-10-CM | POA: Diagnosis not present

## 2020-10-30 DIAGNOSIS — Z66 Do not resuscitate: Secondary | ICD-10-CM | POA: Diagnosis present

## 2020-10-30 DIAGNOSIS — D649 Anemia, unspecified: Secondary | ICD-10-CM | POA: Diagnosis not present

## 2020-10-30 DIAGNOSIS — Z923 Personal history of irradiation: Secondary | ICD-10-CM | POA: Diagnosis not present

## 2020-10-30 DIAGNOSIS — C169 Malignant neoplasm of stomach, unspecified: Secondary | ICD-10-CM | POA: Diagnosis not present

## 2020-10-30 DIAGNOSIS — I110 Hypertensive heart disease with heart failure: Secondary | ICD-10-CM | POA: Diagnosis present

## 2020-10-30 DIAGNOSIS — A419 Sepsis, unspecified organism: Secondary | ICD-10-CM | POA: Diagnosis present

## 2020-10-30 DIAGNOSIS — H903 Sensorineural hearing loss, bilateral: Secondary | ICD-10-CM | POA: Diagnosis present

## 2020-10-30 DIAGNOSIS — Z9689 Presence of other specified functional implants: Secondary | ICD-10-CM | POA: Diagnosis not present

## 2020-10-30 DIAGNOSIS — Z434 Encounter for attention to other artificial openings of digestive tract: Secondary | ICD-10-CM | POA: Diagnosis not present

## 2020-10-30 DIAGNOSIS — Z85028 Personal history of other malignant neoplasm of stomach: Secondary | ICD-10-CM | POA: Diagnosis not present

## 2020-10-30 DIAGNOSIS — R6510 Systemic inflammatory response syndrome (SIRS) of non-infectious origin without acute organ dysfunction: Secondary | ICD-10-CM | POA: Diagnosis not present

## 2020-10-30 DIAGNOSIS — E871 Hypo-osmolality and hyponatremia: Secondary | ICD-10-CM | POA: Diagnosis present

## 2020-10-30 HISTORY — PX: IR EXCHANGE BILIARY DRAIN: IMG6046

## 2020-10-30 MED ORDER — IOHEXOL 300 MG/ML  SOLN
25.0000 mL | Freq: Once | INTRAMUSCULAR | Status: AC | PRN
  Administered 2020-10-30: 25 mL

## 2020-10-30 MED ORDER — LIDOCAINE HCL 1 % IJ SOLN
INTRAMUSCULAR | Status: AC
  Filled 2020-10-30: qty 20

## 2020-10-30 NOTE — Procedures (Signed)
Pre procedural Dx: Metastatic gastric cancer Post procedural Dx: Same  Successful fluoroscopic guided exchange of left sided 12 French percutaneous biliary drainage catheter.   Successful fluoroscopic guided exchange 10 French cholecystostomy tube.   EBL: None Complications: None immediate  PLAN:  - Continued current management of both drainage catheters including capping of the biliary drainage catheter and maintaining the cholecystostomy tube to external drainage.  Neither catheter requires routine flushing.   - The patient should return for routine fluoroscopic guided exchange of both drainage catheters and approximately 8 weeks.  Note, consideration for internal biliary stent placement will be determined following next post treatment staging CT.   Ronny Bacon, MD Pager #: [phone]

## 2020-10-31 ENCOUNTER — Emergency Department (HOSPITAL_COMMUNITY): Payer: Medicare PPO

## 2020-10-31 ENCOUNTER — Inpatient Hospital Stay (HOSPITAL_COMMUNITY)
Admission: EM | Admit: 2020-10-31 | Discharge: 2020-11-03 | DRG: 871 | Disposition: A | Discharge status: Home / Self Care | Payer: Medicare PPO | Attending: Internal Medicine | Admitting: Internal Medicine

## 2020-10-31 ENCOUNTER — Encounter (HOSPITAL_COMMUNITY): Payer: Self-pay | Admitting: Emergency Medicine

## 2020-10-31 ENCOUNTER — Other Ambulatory Visit: Payer: Self-pay

## 2020-10-31 DIAGNOSIS — E785 Hyperlipidemia, unspecified: Secondary | ICD-10-CM | POA: Diagnosis present

## 2020-10-31 DIAGNOSIS — H903 Sensorineural hearing loss, bilateral: Secondary | ICD-10-CM | POA: Diagnosis present

## 2020-10-31 DIAGNOSIS — Z8249 Family history of ischemic heart disease and other diseases of the circulatory system: Secondary | ICD-10-CM

## 2020-10-31 DIAGNOSIS — Z66 Do not resuscitate: Secondary | ICD-10-CM | POA: Diagnosis present

## 2020-10-31 DIAGNOSIS — R6510 Systemic inflammatory response syndrome (SIRS) of non-infectious origin without acute organ dysfunction: Secondary | ICD-10-CM | POA: Diagnosis present

## 2020-10-31 DIAGNOSIS — Z823 Family history of stroke: Secondary | ICD-10-CM

## 2020-10-31 DIAGNOSIS — C786 Secondary malignant neoplasm of retroperitoneum and peritoneum: Secondary | ICD-10-CM | POA: Diagnosis present

## 2020-10-31 DIAGNOSIS — C799 Secondary malignant neoplasm of unspecified site: Secondary | ICD-10-CM

## 2020-10-31 DIAGNOSIS — C772 Secondary and unspecified malignant neoplasm of intra-abdominal lymph nodes: Secondary | ICD-10-CM | POA: Diagnosis present

## 2020-10-31 DIAGNOSIS — Z83438 Family history of other disorder of lipoprotein metabolism and other lipidemia: Secondary | ICD-10-CM

## 2020-10-31 DIAGNOSIS — E119 Type 2 diabetes mellitus without complications: Secondary | ICD-10-CM | POA: Diagnosis present

## 2020-10-31 DIAGNOSIS — E871 Hypo-osmolality and hyponatremia: Secondary | ICD-10-CM | POA: Diagnosis present

## 2020-10-31 DIAGNOSIS — K831 Obstruction of bile duct: Secondary | ICD-10-CM | POA: Diagnosis present

## 2020-10-31 DIAGNOSIS — I5042 Chronic combined systolic (congestive) and diastolic (congestive) heart failure: Secondary | ICD-10-CM | POA: Diagnosis present

## 2020-10-31 DIAGNOSIS — Z809 Family history of malignant neoplasm, unspecified: Secondary | ICD-10-CM

## 2020-10-31 DIAGNOSIS — E861 Hypovolemia: Secondary | ICD-10-CM | POA: Diagnosis present

## 2020-10-31 DIAGNOSIS — Z923 Personal history of irradiation: Secondary | ICD-10-CM

## 2020-10-31 DIAGNOSIS — N400 Enlarged prostate without lower urinary tract symptoms: Secondary | ICD-10-CM | POA: Diagnosis present

## 2020-10-31 DIAGNOSIS — A419 Sepsis, unspecified organism: Principal | ICD-10-CM | POA: Diagnosis present

## 2020-10-31 DIAGNOSIS — Z981 Arthrodesis status: Secondary | ICD-10-CM

## 2020-10-31 DIAGNOSIS — Z833 Family history of diabetes mellitus: Secondary | ICD-10-CM

## 2020-10-31 DIAGNOSIS — Z903 Acquired absence of stomach [part of]: Secondary | ICD-10-CM

## 2020-10-31 DIAGNOSIS — Z20822 Contact with and (suspected) exposure to covid-19: Secondary | ICD-10-CM | POA: Diagnosis present

## 2020-10-31 DIAGNOSIS — K219 Gastro-esophageal reflux disease without esophagitis: Secondary | ICD-10-CM | POA: Diagnosis present

## 2020-10-31 DIAGNOSIS — C169 Malignant neoplasm of stomach, unspecified: Secondary | ICD-10-CM | POA: Diagnosis present

## 2020-10-31 DIAGNOSIS — I110 Hypertensive heart disease with heart failure: Secondary | ICD-10-CM | POA: Diagnosis present

## 2020-10-31 DIAGNOSIS — Z85028 Personal history of other malignant neoplasm of stomach: Secondary | ICD-10-CM

## 2020-10-31 DIAGNOSIS — K8309 Other cholangitis: Secondary | ICD-10-CM | POA: Diagnosis present

## 2020-10-31 DIAGNOSIS — D649 Anemia, unspecified: Secondary | ICD-10-CM | POA: Diagnosis present

## 2020-10-31 DIAGNOSIS — Z87891 Personal history of nicotine dependence: Secondary | ICD-10-CM

## 2020-10-31 LAB — COMPREHENSIVE METABOLIC PANEL
ALT: 35 U/L (ref 0–44)
AST: 44 U/L — ABNORMAL HIGH (ref 15–41)
Albumin: 2.6 g/dL — ABNORMAL LOW (ref 3.5–5.0)
Alkaline Phosphatase: 218 U/L — ABNORMAL HIGH (ref 38–126)
Anion gap: 7 (ref 5–15)
BUN: 13 mg/dL (ref 8–23)
CO2: 23 mmol/L (ref 22–32)
Calcium: 8.1 mg/dL — ABNORMAL LOW (ref 8.9–10.3)
Chloride: 99 mmol/L (ref 98–111)
Creatinine, Ser: 0.83 mg/dL (ref 0.61–1.24)
GFR, Estimated: >60 mL/min (ref >60)
Glucose, Bld: 124 mg/dL — ABNORMAL HIGH (ref 70–99)
Potassium: 3.8 mmol/L (ref 3.5–5.1)
Sodium: 129 mmol/L — ABNORMAL LOW (ref 135–145)
Total Bilirubin: 1 mg/dL (ref 0.3–1.2)
Total Protein: 6.7 g/dL (ref 6.5–8.1)

## 2020-10-31 LAB — PROTIME-INR
INR: 1.1 (ref 0.8–1.2)
Prothrombin Time: 14.5 seconds (ref 11.4–15.2)

## 2020-10-31 LAB — CBC WITH DIFFERENTIAL/PLATELET
Abs Immature Granulocytes: 0.02 10*3/uL (ref 0.00–0.07)
Basophils Absolute: 0 10*3/uL (ref 0.0–0.1)
Basophils Relative: 0 %
Eosinophils Absolute: 0 10*3/uL (ref 0.0–0.5)
Eosinophils Relative: 0 %
HCT: 30.5 % — ABNORMAL LOW (ref 39.0–52.0)
Hemoglobin: 10.2 g/dL — ABNORMAL LOW (ref 13.0–17.0)
Immature Granulocytes: 0 %
Lymphocytes Relative: 34 %
Lymphs Abs: 2.5 10*3/uL (ref 0.7–4.0)
MCH: 28.5 pg (ref 26.0–34.0)
MCHC: 33.4 g/dL (ref 30.0–36.0)
MCV: 85.2 fL (ref 80.0–100.0)
Monocytes Absolute: 0.8 10*3/uL (ref 0.1–1.0)
Monocytes Relative: 11 %
Neutro Abs: 4 10*3/uL (ref 1.7–7.7)
Neutrophils Relative %: 55 %
Platelets: 155 10*3/uL (ref 150–400)
RBC: 3.58 MIL/uL — ABNORMAL LOW (ref 4.22–5.81)
RDW: 14.6 % (ref 11.5–15.5)
WBC: 7.4 10*3/uL (ref 4.0–10.5)
nRBC: 0 % (ref 0.0–0.2)

## 2020-10-31 LAB — LACTIC ACID, PLASMA
Lactic Acid, Venous: 1.8 mmol/L (ref 0.5–1.9)
Lactic Acid, Venous: 2.3 mmol/L (ref 0.5–1.9)

## 2020-10-31 LAB — URINALYSIS, ROUTINE W REFLEX MICROSCOPIC
Bilirubin Urine: NEGATIVE
Glucose, UA: NEGATIVE mg/dL
Hgb urine dipstick: NEGATIVE
Ketones, ur: NEGATIVE mg/dL
Leukocytes,Ua: NEGATIVE
Nitrite: NEGATIVE
Protein, ur: NEGATIVE mg/dL
Specific Gravity, Urine: 1.015 (ref 1.005–1.030)
pH: 5 (ref 5.0–8.0)

## 2020-10-31 LAB — APTT: aPTT: 33 seconds (ref 24–36)

## 2020-10-31 LAB — RESP PANEL BY RT-PCR (FLU A&B, COVID) ARPGX2
Influenza A by PCR: NEGATIVE
Influenza B by PCR: NEGATIVE
SARS Coronavirus 2 by RT PCR: NEGATIVE

## 2020-10-31 MED ORDER — LACTATED RINGERS IV SOLN: INTRAVENOUS | Status: DC

## 2020-10-31 MED ORDER — METRONIDAZOLE 500 MG/100ML IV SOLN
500.0000 mg | Freq: Once | INTRAVENOUS | Status: AC
  Administered 2020-10-31: 500 mg via INTRAVENOUS
  Filled 2020-10-31: qty 100

## 2020-10-31 MED ORDER — LACTATED RINGERS IV BOLUS (SEPSIS)
1000.0000 mL | Freq: Once | INTRAVENOUS | Status: AC
  Administered 2020-10-31: 1000 mL via INTRAVENOUS

## 2020-10-31 MED ORDER — SODIUM CHLORIDE 0.9 % IV SOLN
2.0000 g | Freq: Once | INTRAVENOUS | Status: AC
  Administered 2020-10-31: 2 g via INTRAVENOUS
  Filled 2020-10-31: qty 2

## 2020-10-31 MED ORDER — LACTATED RINGERS IV BOLUS (SEPSIS)
250.0000 mL | Freq: Once | INTRAVENOUS | Status: AC
  Administered 2020-10-31: 250 mL via INTRAVENOUS

## 2020-10-31 NOTE — ED Triage Notes (Signed)
Patient is complaining of fever and chills that started 7/29. Patient has stomach cancer.

## 2020-10-31 NOTE — Progress Notes (Signed)
A consult was received from an ED physician for cefepime per pharmacy dosing (for an indication other than meningitis). The patient's profile has been reviewed for ht/wt/allergies/indication/available labs. A one time order has been placed for the above antibiotics.  Further antibiotics/pharmacy consults should be ordered by admitting physician if indicated.                       Reuel Boom, PharmD, BCPS [phone] 10/31/2020, 9:09 PM

## 2020-10-31 NOTE — ED Provider Notes (Addendum)
[facility] DEPT Provider Note   CSN: [id number] Arrival date & time: 10/31/20  2015     History Chief Complaint  Patient presents with   Cancer   Fever    Jonathan Lewis is a 80 y.o. male.  HPI     This is an 80 year old male with a history of gastric cancer, acoustic neuroma, diabetes, hypertension, hyperlipidemia who currently has cholecystostomy tube and biliary drain who presents with fever.  Patient reports fever up to 104 yesterday.  He states ever since having his tubes exchanged on Friday he has not felt well.  Denies any overt abdominal pain.  He does report persistent nausea.  No vomiting.  Reports normal bowel movements.  No known sick contacts or COVID exposures.  No dysuria.  No cough or shortness of breath.  Chart reviewed.  Cholecystostomy and biliary drains exchanged by IR on Friday.  Past Medical History:  Diagnosis Date   Acoustic neuroma ([place]) 06/25/2015   Allergic rhinitis 07/18/2014   Allergy    Anemia    Arthritis    BPH (benign prostatic hyperplasia)    Cancer ([place])    stage III stomach-Dx 03/2019   Carbuncle    recurrent MRSA carbuncles   Cataract    Per pt bilateral cataracts removed.   Diabetes mellitus    Type II. Per pt Dr. Jenny Reichmann took him off his dm med.   Disk prolapse    Glaucoma    Hyperlipidemia    Hypertension    Male hypogonadism 07/16/2014   Neuropathy    Pneumonia    Sinus bradycardia    chronic, asymptomatic   Sinusitis 07/29/2012    Patient Active Problem List   Diagnosis Date Noted   SIRS (systemic inflammatory response syndrome) (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[place]) 09/16/2020   Biliary obstruction 09/09/2020   Right knee pain 04/22/2020   Myelopathy ([place]) 01/08/2020   Iron deficiency anemia 06/04/2019   Abnormal PET scan, mediastinum 05/29/2019   Mediastinal adenopathy 05/22/2019   Goals of care,  counseling/discussion 04/29/2019   Gastric cancer pT3pN2 s/p distal gastrectomy with Billroth II gastrojejunostomy 03/19/2019 03/24/2019   H/O exploratory laparotomy 03/19/2019   Malnutrition of moderate degree 03/08/2019   Gastric outlet obstruction 03/07/2019   Weight loss 03/05/2019   Nausea and vomiting 03/05/2019   Decreased appetite 03/05/2019   Epigastric pain 12/05/2018   Hyperglycemia 12/05/2018   DDD (degenerative disc disease), cervical 04/16/2018   Spinal stenosis in cervical region 04/16/2018   Lumbar radiculopathy 01/02/2018   Leg pain, bilateral 11/08/2017   Bursitis of right hip 05/10/2017   Left shoulder pain 05/10/2017   Physical deconditioning 05/10/2017   Wheezing 12/13/2016   Cough 11/03/2016   Neck pain on left side 11/03/2016   Bilateral leg paresthesia 11/10/2015   Sensorineural hearing loss (SNHL) of both ears 07/27/2015   Tinnitus of both ears 07/27/2015   Left acoustic neuroma ([place]) 06/25/2015   Hematochezia 05/13/2015   Abnormal finding on MRI of brain 05/13/2015   Vertigo 05/05/2015   GERD (gastroesophageal reflux disease) 05/05/2015   BPH (benign prostatic hyperplasia) 05/05/2015   Bradycardia    Chest pain 07/18/2014   Allergic rhinitis 07/18/2014   Pain of right thumb 07/18/2014   Male hypogonadism 07/16/2014   Encounter for well adult exam with abnormal findings 07/17/2013   Right sided sciatica 07/17/2013   Constipation 01/21/2012   Leukopenia 06/11/2011   HEARING LOSS 04/10/2009  GROIN PAIN 01/26/2009   Osteoarthritis, hand 10/27/2008   LOW BACK PAIN, CHRONIC 10/27/2008   Diabetes ([place]) 07/17/2008   Hyperlipidemia 06/06/2007   EXTERNAL HEMORRHOIDS 02/15/2007   Benign nodular prostatic hyperplasia 02/15/2007   ERECTILE DYSFUNCTION 10/31/2006   Hypertension complicating diabetes ([place]) 10/31/2006    Past Surgical History:  Procedure Laterality Date   ANTERIOR CERVICAL DECOMPRESSION/DISCECTOMY FUSION 4 LEVELS N/A 01/08/2020    Procedure: ANTERIOR CERVICAL DECOMPRESSION FUSION CERVICAL 3-4, CERVICAL 4-5, CERVIAL 5-6 WITH INSTRUMENTATION AND ALLOGRAFT;  Surgeon: Phylliss Bob, MD;  Location: [facility];  Service: Orthopedics;  Laterality: N/A;   BALLOON DILATION N/A 03/12/2019   Procedure: BALLOON DILATION;  Surgeon: Rush Landmark Telford Nab., MD;  Location: [facility];  Service: Gastroenterology;  Laterality: N/A;  pyloric   BIOPSY  03/08/2019   Procedure: BIOPSY;  Surgeon: Yetta Flock, MD;  Location: [facility];  Service: Gastroenterology;;   BIOPSY  03/12/2019   Procedure: BIOPSY;  Surgeon: Irving Copas., MD;  Location: [facility];  Service: Gastroenterology;;   CATARACT EXTRACTION     x 2   COLONOSCOPY     ENDOSCOPIC RETROGRADE CHOLANGIOPANCREATOGRAPHY (ERCP) WITH PROPOFOL N/A 09/09/2020   Procedure: ENDOSCOPIC RETROGRADE CHOLANGIOPANCREATOGRAPHY (ERCP) WITH PROPOFOL;  Surgeon: Irving Copas., MD;  Location: [facility];  Service: Gastroenterology;  Laterality: N/A;   ESOPHAGOGASTRODUODENOSCOPY N/A 03/08/2019   Procedure: ESOPHAGOGASTRODUODENOSCOPY (EGD);  Surgeon: Yetta Flock, MD;  Location: [facility];  Service: Gastroenterology;  Laterality: N/A;   ESOPHAGOGASTRODUODENOSCOPY (EGD) WITH PROPOFOL N/A 03/12/2019   Procedure: ESOPHAGOGASTRODUODENOSCOPY (EGD) WITH PROPOFOL;  Surgeon: Rush Landmark Telford Nab., MD;  [facility];  Service: Gastroenterology;  Laterality: N/A;   FINE NEEDLE ASPIRATION  03/12/2019   Procedure: FINE NEEDLE ASPIRATION (FNA) LINEAR;  Surgeon: Irving Copas., MD;  [facility]: [facility];  Service: Gastroenterology;;   FOREIGN BODY REMOVAL  03/08/2019   Procedure: FOREIGN BODY REMOVAL;  Surgeon: Yetta Flock, MD;  [facility];  Service: Gastroenterology;;   IR BILIARY DRAIN PLACEMENT WITH CHOLANGIOGRAM  09/09/2020   IR CHOLANGIOGRAM EXISTING TUBE  09/29/2020   IR CHOLANGIOGRAM EXISTING TUBE  09/29/2020   IR EXCHANGE BILIARY DRAIN   09/24/2020   IR EXCHANGE BILIARY DRAIN  10/30/2020   IR EXCHANGE BILIARY DRAIN  10/30/2020   IR INT EXT BILIARY DRAIN WITH CHOLANGIOGRAM  09/10/2020   IR PATIENT EVAL TECH 0-60 MINS  04/03/2019   IR PERC CHOLECYSTOSTOMY  09/24/2020   LAPAROTOMY N/A 03/19/2019   Procedure: EXPLORATORY LAPAROTOMY, distal GASTRECTOMY AND PLACEMENT OF G AND J TUBE, gastric jejunostomy;  Surgeon: Greer Pickerel, MD;  Location: [facility];  Service: General;  Laterality: N/A;   POLYPECTOMY     PORTACATH PLACEMENT N/A 03/26/2019   Procedure: INSERTION PORT-A-CATH;  Surgeon: Michael Boston, MD;  Location: [facility];  Service: General;  Laterality: N/A;   [place] INJECTION  09/09/2020   Procedure: SUBMUCOSAL TATTOO INJECTION;  Surgeon: Irving Copas., MD;  Location: [facility];  Service: Gastroenterology;;   UPPER ESOPHAGEAL ENDOSCOPIC ULTRASOUND (EUS) N/A 03/12/2019   Procedure: UPPER ESOPHAGEAL ENDOSCOPIC ULTRASOUND (EUS);  Surgeon: Irving Copas., MD;  Location: [facility];  Service: Gastroenterology;  Laterality: N/A;   UPPER GASTROINTESTINAL ENDOSCOPY     VIDEO BRONCHOSCOPY WITH ENDOBRONCHIAL ULTRASOUND N/A 05/22/2019   Procedure: VIDEO BRONCHOSCOPY WITH ENDOBRONCHIAL ULTRASOUND;  Surgeon: Garner Nash, DO;  Location: [facility];  Service: Thoracic;  Laterality: N/A;       Family History  Problem Relation Age of Onset  Cancer Mother    Hypertension Mother    Diabetes Sister    Cancer Brother        colon   Hypertension Brother    Hyperlipidemia Brother    Stroke Brother    Hypertension Sister    Colon cancer Neg Hx    Heart attack Neg Hx    Sudden death Neg Hx    Esophageal cancer Neg Hx    Pancreatic cancer Neg Hx    Stomach cancer Neg Hx    Rectal cancer Neg Hx     Social History   Tobacco Use   Smoking status: Former    Packs/day: 1.00    Years: 24.00    Pack years: 24.00    Types: Cigarettes    Quit date: 10/03/1978    Years since quitting: 42.1    Smokeless tobacco: Never  Vaping Use   Vaping Use: Never used  Substance Use Topics   Alcohol use: Not Currently   Drug use: No    Home Medications Prior to Admission medications   Medication Sig Start Date End Date Taking? Authorizing Provider  dorzolamide-timolol (COSOPT) 22.3-6.8 MG/ML ophthalmic solution Place 1 drop into both eyes 2 (two) times daily. 06/15/20   [provider]  famotidine (PEPCID) 20 MG tablet TAKE 1 TABLET BY MOUTH EVERY DAY 09/10/20   Volanda Napoleon, MD  latanoprost (XALATAN) 0.005 % ophthalmic solution Place 1 drop into both eyes at bedtime.  01/09/19   [provider]  lidocaine-prilocaine (EMLA) cream Apply 1 application topically as needed. Place on the port one hour before appointment. 08/24/20   Volanda Napoleon, MD  meclizine (ANTIVERT) 12.5 MG tablet TAKE 1 TABLET BY MOUTH 3 TIMES DAILY AS NEEDED FOR DIZZINESS. Patient taking differently: Take 12.5 mg by mouth daily as needed for dizziness. 08/10/20   Biagio Borg, MD  Multiple Vitamin (MULTIVITAMIN WITH MINERALS) TABS tablet Take 1 tablet by mouth in the morning. Men's One-A-Day Chewable    [provider]  ondansetron (ZOFRAN) 8 MG tablet Take 1 tablet (8 mg total) by mouth every 8 (eight) hours as needed for nausea or vomiting. 09/16/20   Hayden Pedro, PA-C  senna-docusate (SENOKOT S) 8.6-50 MG tablet Take 1 tablet by mouth daily. 09/12/20   Domenic Polite, MD  SYSTANE ULTRA 0.4-0.3 % SOLN Place 1 drop into both eyes 3 (three) times daily as needed (dry eyes). 01/09/19   [provider]    Allergies    Patient has no known allergies.  Review of Systems   Review of Systems  Constitutional:  Positive for fatigue and fever.  Respiratory:  Negative for shortness of breath.   Cardiovascular:  Negative for chest pain.  Gastrointestinal:  Positive for abdominal pain and nausea. Negative for diarrhea and vomiting.  Genitourinary:  Negative for dysuria.  All other  systems reviewed and are negative.  Physical Exam Updated Vital Signs BP (!) 132/97   Pulse 100   Temp (!) 103 F (39.4 C) (Oral)   Resp 20   Ht 1.727 m ('5\' 8"'$ )   Wt 72.6 kg   SpO2 98%   BMI 24.33 kg/m   Physical Exam Vitals and nursing note reviewed.  Constitutional:      Appearance: He is well-developed. He is diaphoretic.     Comments: Elderly, chronically ill-appearing  HENT:     Head: Normocephalic and atraumatic.     Mouth/Throat:     Mouth: Mucous membranes are moist.  Eyes:  Pupils: Pupils are equal, round, and reactive to light.  Cardiovascular:     Rate and Rhythm: Regular rhythm. Tachycardia present.     Heart sounds: Normal heart sounds. No murmur heard. Pulmonary:     Effort: Pulmonary effort is normal. No respiratory distress.     Breath sounds: Normal breath sounds. No wheezing.  Abdominal:     General: Bowel sounds are normal.     Palpations: Abdomen is soft.     Tenderness: There is no abdominal tenderness. There is no rebound.     Comments: Soft, 2 drains noted 1 in the right upper quadrant 1 in the right mid abdomen, right upper quadrant drain draining into bag with serosanguineous fluid, no purulence  Musculoskeletal:     Cervical back: Neck supple.     Right lower leg: No edema.     Left lower leg: No edema.  Lymphadenopathy:     Cervical: No cervical adenopathy.  Skin:    General: Skin is warm.  Neurological:     Mental Status: He is alert and oriented to person, place, and time.  Psychiatric:        Mood and Affect: Mood normal.    ED Results / Procedures / Treatments   Labs (all labs ordered are listed, but only abnormal results are displayed) Labs Reviewed  COMPREHENSIVE METABOLIC PANEL - Abnormal; Notable for the following components:      Result Value   Sodium 129 (*)    Glucose, Bld 124 (*)    Calcium 8.1 (*)    Albumin 2.6 (*)    AST 44 (*)    Alkaline Phosphatase 218 (*)    All other components within normal limits  CBC  WITH DIFFERENTIAL/PLATELET - Abnormal; Notable for the following components:   RBC 3.58 (*)    Hemoglobin 10.2 (*)    HCT 30.5 (*)    All other components within normal limits  CULTURE, BLOOD (ROUTINE X 2)  CULTURE, BLOOD (ROUTINE X 2)  RESP PANEL BY RT-PCR (FLU A&B, COVID) ARPGX2  URINE CULTURE  LACTIC ACID, PLASMA  PROTIME-INR  URINALYSIS, ROUTINE W REFLEX MICROSCOPIC  APTT  LACTIC ACID, PLASMA    EKG None  Radiology DG Chest 2 View  Result Date: 10/31/2020 CLINICAL DATA:  Sepsis EXAM: CHEST - 2 VIEW COMPARISON:  09/21/2020 FINDINGS: Lungs are well expanded, symmetric, and clear. No pneumothorax or pleural effusion. Cardiac size within normal limits. Pulmonary vascularity is normal. Right internal jugular chest port tip is seen at the superior cavoatrial junction, unchanged osseous structures are age-appropriate. No acute bone abnormality. IMPRESSION: No active cardiopulmonary disease. Electronically Signed   By: Fidela Salisbury MD   On: 10/31/2020 21:53   IR EXCHANGE BILIARY DRAIN  Result Date: 10/30/2020 INDICATION: History of gastric cancer complicated by development of obstructive jaundice, post placement of internal/external drainage catheter on 09/10/2020 with subsequent fluoroscopic guided exchange and up sizing 6/23/2 as well as image guided placement a cholecystostomy tube 09/04/2020 Patient presents today for routine fluoroscopic guided exchange of both catheters. The patient's biliary drainage catheter remains capped for which he has tolerated well. The cholecystostomy tube remains connected gravity bag. EXAM: 1. FLUOROSCOPIC GUIDED LEFT-SIDED PERCUTANEOUS BILIARY DRAINAGE CATHETER 2. FLUOROSCOPIC GUIDED CHOLECYSTOSTOMY TUBE EXCHANGE COMPARISON:  COMPARISON Cholangiogram via existing percutaneous biliary drainage catheter and cholecystostomy tube-09/29/2020 CONTRAST:  A total of 25 cc Omnipaque 300 was administered into the gallbladder lumen and the biliary tree. FLUOROSCOPY  TIME:  1 minute, 24 seconds (24 mGy) COMPLICATIONS: None  immediate. TECHNIQUE: Informed written consent was obtained from the patient after a discussion of the risks, benefits and alternatives to treatment. Questions regarding the procedure were encouraged and answered. A timeout was performed prior to the initiation of the procedure. The external portion of the existing left-sided percutaneous biliary drainage catheter as well as the cholecystostomy tube and surrounding skin were prepped and draped in the usual sterile fashion. A sterile drape was applied covering the operative field. Maximum barrier sterile technique with sterile gowns and gloves were used for the procedure. A timeout was performed prior to the initiation of the procedure. Preprocedural spot fluoroscopic image was obtained both the cholecystostomy tube as well as the biliary catheter. A pre procedural spot fluoroscopic image was obtained after contrast was injected via the existing biliary drainage catheter. The existing biliary drainage catheter was cut and cannulated with a stiff Glidewire wire which was coiled within the proximal small bowel. Under intermittent fluoroscopic guidance, the existing PBD was exchanged for a new 12 [place] PBD. Small amount of contrast was injected via the exchanged biliary drainage catheter and a post exchange spot fluoroscopic image was obtained. Preprocedural spot fluoroscopic image was obtained following injection of small amount of contrast via the existing cholecystostomy tube. Next a cholecystostomy tube was cut and cannulated with a short Amplatz wire which was coiled within gallbladder lumen. Fluoroscopic guidance the existing cholecystostomy tube was exchanged for a new, 10 French drainage catheter with end coiled locked within gallbladder lumen. Small amount of contrast was injected demonstrating appropriate position functionality. Both catheters were secured to the skin entrance site within interrupted  sutures and StatLock devices. The biliary drainage catheter was capped and the cholecystostomy tube was reconnected to a gravity bag was previously. Dressings were applied. The patient tolerated the above procedures well without immediate postprocedural complication. FINDINGS: The existing percutaneous biliary catheter and cholecystostomy tube are both appropriately positioned and functioning. After successful fluoroscopic guided exchange, the new 12 French percutaneous biliary catheter is appropriately positioned with end coiled and locked within the proximal duodenum. After fluoroscopic guided exchange, the new 10 [place] cholecystostomy tube is appropriately positioned with end coiled and locked within the gallbladder lumen. IMPRESSION: 1. Successful fluoroscopic guided exchange of left sided 12 French percutaneous biliary drainage catheter. 2. Successful fluoroscopic guided exchange 10 French cholecystostomy tube. PLAN: - Continued current management of both drainage catheters including capping of the biliary drainage catheter and maintaining the cholecystostomy tube to external drainage. Neither catheter requires routine flushing. - The patient should return for routine fluoroscopic guided exchange of both drainage catheters and approximately 8 weeks. Note, consideration for internal biliary stent placement will be determined following next post treatment staging CT. Electronically Signed   By: Sandi Mariscal M.D.   On: 10/30/2020 16:36   IR EXCHANGE BILIARY DRAIN  Result Date: 10/30/2020 INDICATION: History of gastric cancer complicated by development of obstructive jaundice, post placement of internal/external drainage catheter on 09/10/2020 with subsequent fluoroscopic guided exchange and up sizing 6/23/2 as well as image guided placement a cholecystostomy tube 09/04/2020 Patient presents today for routine fluoroscopic guided exchange of both catheters. The patient's biliary drainage catheter remains capped  for which he has tolerated well. The cholecystostomy tube remains connected gravity bag. EXAM: 1. FLUOROSCOPIC GUIDED LEFT-SIDED PERCUTANEOUS BILIARY DRAINAGE CATHETER 2. FLUOROSCOPIC GUIDED CHOLECYSTOSTOMY TUBE EXCHANGE COMPARISON:  COMPARISON Cholangiogram via existing percutaneous biliary drainage catheter and cholecystostomy tube-09/29/2020 CONTRAST:  A total of 25 cc Omnipaque 300 was administered into the gallbladder lumen and the biliary  tree. FLUOROSCOPY TIME:  1 minute, 24 seconds (24 mGy) COMPLICATIONS: None immediate. TECHNIQUE: Informed written consent was obtained from the patient after a discussion of the risks, benefits and alternatives to treatment. Questions regarding the procedure were encouraged and answered. A timeout was performed prior to the initiation of the procedure. The external portion of the existing left-sided percutaneous biliary drainage catheter as well as the cholecystostomy tube and surrounding skin were prepped and draped in the usual sterile fashion. A sterile drape was applied covering the operative field. Maximum barrier sterile technique with sterile gowns and gloves were used for the procedure. A timeout was performed prior to the initiation of the procedure. Preprocedural spot fluoroscopic image was obtained both the cholecystostomy tube as well as the biliary catheter. A pre procedural spot fluoroscopic image was obtained after contrast was injected via the existing biliary drainage catheter. The existing biliary drainage catheter was cut and cannulated with a stiff Glidewire wire which was coiled within the proximal small bowel. Under intermittent fluoroscopic guidance, the existing PBD was exchanged for a new 12 [place] PBD. Small amount of contrast was injected via the exchanged biliary drainage catheter and a post exchange spot fluoroscopic image was obtained. Preprocedural spot fluoroscopic image was obtained following injection of small amount of contrast via the  existing cholecystostomy tube. Next a cholecystostomy tube was cut and cannulated with a short Amplatz wire which was coiled within gallbladder lumen. Fluoroscopic guidance the existing cholecystostomy tube was exchanged for a new, 10 French drainage catheter with end coiled locked within gallbladder lumen. Small amount of contrast was injected demonstrating appropriate position functionality. Both catheters were secured to the skin entrance site within interrupted sutures and StatLock devices. The biliary drainage catheter was capped and the cholecystostomy tube was reconnected to a gravity bag was previously. Dressings were applied. The patient tolerated the above procedures well without immediate postprocedural complication. FINDINGS: The existing percutaneous biliary catheter and cholecystostomy tube are both appropriately positioned and functioning. After successful fluoroscopic guided exchange, the new 12 French percutaneous biliary catheter is appropriately positioned with end coiled and locked within the proximal duodenum. After fluoroscopic guided exchange, the new 10 [place] cholecystostomy tube is appropriately positioned with end coiled and locked within the gallbladder lumen. IMPRESSION: 1. Successful fluoroscopic guided exchange of left sided 12 French percutaneous biliary drainage catheter. 2. Successful fluoroscopic guided exchange 10 French cholecystostomy tube. PLAN: - Continued current management of both drainage catheters including capping of the biliary drainage catheter and maintaining the cholecystostomy tube to external drainage. Neither catheter requires routine flushing. - The patient should return for routine fluoroscopic guided exchange of both drainage catheters and approximately 8 weeks. Note, consideration for internal biliary stent placement will be determined following next post treatment staging CT. Electronically Signed   By: Sandi Mariscal M.D.   On: 10/30/2020 16:36     Procedures .Critical Care  Date/Time: 11/16/2020 4:50 AM Performed by: Merryl Hacker, MD Authorized by: Merryl Hacker, MD   Critical care provider statement:    Critical care time (minutes):  35   Critical care was time spent personally by me on the following activities:  Discussions with consultants, evaluation of patient's response to treatment, examination of patient, ordering and performing treatments and interventions, ordering and review of laboratory studies, ordering and review of radiographic studies, pulse oximetry, re-evaluation of patient's condition, obtaining history from patient or surrogate and review of old charts   Medications Ordered in ED Medications  lactated ringers infusion (0 mLs  Intravenous Hold 10/31/20 2127)  lactated ringers bolus 1,000 mL (0 mLs Intravenous Stopped 10/31/20 2222)    And  lactated ringers bolus 1,000 mL (1,000 mLs Intravenous New Bag/Given 10/31/20 2222)    And  lactated ringers bolus 250 mL (has no administration in time range)  ceFEPIme (MAXIPIME) 2 g in sodium chloride 0.9 % 100 mL IVPB (0 g Intravenous Stopped 10/31/20 2203)  metroNIDAZOLE (FLAGYL) IVPB 500 mg (500 mg Intravenous New Bag/Given 10/31/20 2159)    ED Course  I have reviewed the triage vital signs and the nursing notes.  Pertinent labs & imaging results that were available during my care of the patient were reviewed by me and considered in my medical decision making (see chart for details).    MDM Rules/Calculators/A&P                           Patient presents with fever.  He states he is generally not feeling well.  This is in the setting of cholecystostomy and biliary change.  He has gastric cancer.  He is not ill-appearing but he is febrile.  Not hypotensive.  Septic work-up was initiated.  He was given broad-spectrum antibiotics of cefepime and Flagyl.  He was also given fluids as he clinically appears dry as well.  Lactate is normal.  No significant  leukocytosis.  Urinalysis and chest x-ray are both reassuring.  Then recent instrumentation and exchange, will obtain CT abdominal.    Patient signed out to oncoming provider.  Final Clinical Impression(s) / ED Diagnoses Final diagnoses:  None    Rx / DC Orders ED Discharge Orders     None        Buddie Marston, Barbette Hair, MD 10/31/20 [id number]    Merryl Hacker, MD 11/16/20 [phone]

## 2020-10-31 NOTE — Sepsis Progress Note (Signed)
Code Sepsis initiated @ 2104 PM, [facility] following.

## 2020-11-01 ENCOUNTER — Emergency Department (HOSPITAL_COMMUNITY): Payer: Medicare PPO

## 2020-11-01 ENCOUNTER — Encounter (HOSPITAL_COMMUNITY): Payer: Self-pay

## 2020-11-01 DIAGNOSIS — R6510 Systemic inflammatory response syndrome (SIRS) of non-infectious origin without acute organ dysfunction: Secondary | ICD-10-CM

## 2020-11-01 DIAGNOSIS — Z66 Do not resuscitate: Secondary | ICD-10-CM | POA: Diagnosis present

## 2020-11-01 DIAGNOSIS — K8309 Other cholangitis: Secondary | ICD-10-CM | POA: Diagnosis present

## 2020-11-01 DIAGNOSIS — E871 Hypo-osmolality and hyponatremia: Secondary | ICD-10-CM | POA: Diagnosis present

## 2020-11-01 DIAGNOSIS — C786 Secondary malignant neoplasm of retroperitoneum and peritoneum: Secondary | ICD-10-CM | POA: Diagnosis present

## 2020-11-01 DIAGNOSIS — N400 Enlarged prostate without lower urinary tract symptoms: Secondary | ICD-10-CM | POA: Diagnosis present

## 2020-11-01 DIAGNOSIS — K831 Obstruction of bile duct: Secondary | ICD-10-CM | POA: Diagnosis present

## 2020-11-01 DIAGNOSIS — Z903 Acquired absence of stomach [part of]: Secondary | ICD-10-CM | POA: Diagnosis not present

## 2020-11-01 DIAGNOSIS — C772 Secondary and unspecified malignant neoplasm of intra-abdominal lymph nodes: Secondary | ICD-10-CM | POA: Diagnosis present

## 2020-11-01 DIAGNOSIS — E861 Hypovolemia: Secondary | ICD-10-CM | POA: Diagnosis present

## 2020-11-01 DIAGNOSIS — Z83438 Family history of other disorder of lipoprotein metabolism and other lipidemia: Secondary | ICD-10-CM | POA: Diagnosis not present

## 2020-11-01 DIAGNOSIS — Z833 Family history of diabetes mellitus: Secondary | ICD-10-CM | POA: Diagnosis not present

## 2020-11-01 DIAGNOSIS — C162 Malignant neoplasm of body of stomach: Secondary | ICD-10-CM

## 2020-11-01 DIAGNOSIS — I110 Hypertensive heart disease with heart failure: Secondary | ICD-10-CM | POA: Diagnosis present

## 2020-11-01 DIAGNOSIS — Z87891 Personal history of nicotine dependence: Secondary | ICD-10-CM | POA: Diagnosis not present

## 2020-11-01 DIAGNOSIS — C169 Malignant neoplasm of stomach, unspecified: Secondary | ICD-10-CM | POA: Diagnosis not present

## 2020-11-01 DIAGNOSIS — Z20822 Contact with and (suspected) exposure to covid-19: Secondary | ICD-10-CM | POA: Diagnosis present

## 2020-11-01 DIAGNOSIS — Z809 Family history of malignant neoplasm, unspecified: Secondary | ICD-10-CM | POA: Diagnosis not present

## 2020-11-01 DIAGNOSIS — E119 Type 2 diabetes mellitus without complications: Secondary | ICD-10-CM | POA: Diagnosis present

## 2020-11-01 DIAGNOSIS — A419 Sepsis, unspecified organism: Secondary | ICD-10-CM | POA: Diagnosis present

## 2020-11-01 DIAGNOSIS — C799 Secondary malignant neoplasm of unspecified site: Secondary | ICD-10-CM | POA: Diagnosis not present

## 2020-11-01 DIAGNOSIS — H903 Sensorineural hearing loss, bilateral: Secondary | ICD-10-CM | POA: Diagnosis present

## 2020-11-01 DIAGNOSIS — I5042 Chronic combined systolic (congestive) and diastolic (congestive) heart failure: Secondary | ICD-10-CM | POA: Diagnosis present

## 2020-11-01 DIAGNOSIS — E785 Hyperlipidemia, unspecified: Secondary | ICD-10-CM | POA: Diagnosis present

## 2020-11-01 DIAGNOSIS — Z923 Personal history of irradiation: Secondary | ICD-10-CM | POA: Diagnosis not present

## 2020-11-01 DIAGNOSIS — D649 Anemia, unspecified: Secondary | ICD-10-CM | POA: Diagnosis present

## 2020-11-01 DIAGNOSIS — R509 Fever, unspecified: Secondary | ICD-10-CM | POA: Diagnosis present

## 2020-11-01 DIAGNOSIS — Z85028 Personal history of other malignant neoplasm of stomach: Secondary | ICD-10-CM | POA: Diagnosis not present

## 2020-11-01 DIAGNOSIS — Z823 Family history of stroke: Secondary | ICD-10-CM | POA: Diagnosis not present

## 2020-11-01 DIAGNOSIS — Z8249 Family history of ischemic heart disease and other diseases of the circulatory system: Secondary | ICD-10-CM | POA: Diagnosis not present

## 2020-11-01 LAB — CBC
HCT: 32.6 % — ABNORMAL LOW (ref 39.0–52.0)
Hemoglobin: 10.7 g/dL — ABNORMAL LOW (ref 13.0–17.0)
MCH: 28.3 pg (ref 26.0–34.0)
MCHC: 32.8 g/dL (ref 30.0–36.0)
MCV: 86.2 fL (ref 80.0–100.0)
Platelets: 146 10*3/uL — ABNORMAL LOW (ref 150–400)
RBC: 3.78 MIL/uL — ABNORMAL LOW (ref 4.22–5.81)
RDW: 14.7 % (ref 11.5–15.5)
WBC: 8.1 10*3/uL (ref 4.0–10.5)
nRBC: 0 % (ref 0.0–0.2)

## 2020-11-01 LAB — PROCALCITONIN: Procalcitonin: 0.31 ng/mL

## 2020-11-01 LAB — LACTIC ACID, PLASMA: Lactic Acid, Venous: 2.1 mmol/L (ref 0.5–1.9)

## 2020-11-01 LAB — COMPREHENSIVE METABOLIC PANEL
ALT: 36 U/L (ref 0–44)
AST: 47 U/L — ABNORMAL HIGH (ref 15–41)
Albumin: 2.5 g/dL — ABNORMAL LOW (ref 3.5–5.0)
Alkaline Phosphatase: 212 U/L — ABNORMAL HIGH (ref 38–126)
Anion gap: 7 (ref 5–15)
BUN: 10 mg/dL (ref 8–23)
CO2: 24 mmol/L (ref 22–32)
Calcium: 8.2 mg/dL — ABNORMAL LOW (ref 8.9–10.3)
Chloride: 98 mmol/L (ref 98–111)
Creatinine, Ser: 0.78 mg/dL (ref 0.61–1.24)
GFR, Estimated: >60 mL/min (ref >60)
Glucose, Bld: 126 mg/dL — ABNORMAL HIGH (ref 70–99)
Potassium: 3.7 mmol/L (ref 3.5–5.1)
Sodium: 129 mmol/L — ABNORMAL LOW (ref 135–145)
Total Bilirubin: 1.5 mg/dL — ABNORMAL HIGH (ref 0.3–1.2)
Total Protein: 6.6 g/dL (ref 6.5–8.1)

## 2020-11-01 MED ORDER — FAMOTIDINE 20 MG PO TABS: 20.0000 mg | ORAL_TABLET | ORAL | Status: DC | PRN

## 2020-11-01 MED ORDER — ENSURE ENLIVE PO LIQD: 237.0000 mL | Freq: Two times a day (BID) | ORAL | Status: DC

## 2020-11-01 MED ORDER — ONDANSETRON HCL 4 MG PO TABS: 4.0000 mg | ORAL_TABLET | Freq: Four times a day (QID) | ORAL | Status: DC | PRN

## 2020-11-01 MED ORDER — ONDANSETRON HCL 4 MG/2ML IJ SOLN: 4.0000 mg | Freq: Four times a day (QID) | INTRAMUSCULAR | Status: DC | PRN

## 2020-11-01 MED ORDER — MECLIZINE HCL 25 MG PO TABS: 12.5000 mg | ORAL_TABLET | Freq: Every day | ORAL | Status: DC | PRN

## 2020-11-01 MED ORDER — ACETAMINOPHEN 650 MG RE SUPP: 650.0000 mg | Freq: Four times a day (QID) | RECTAL | Status: DC | PRN

## 2020-11-01 MED ORDER — IOHEXOL 350 MG/ML SOLN
80.0000 mL | Freq: Once | INTRAVENOUS | Status: AC | PRN
  Administered 2020-11-01: 80 mL via INTRAVENOUS

## 2020-11-01 MED ORDER — LATANOPROST 0.005 % OP SOLN
1.0000 [drp] | Freq: Every day | OPHTHALMIC | Status: DC
  Administered 2020-11-01 – 2020-11-02 (×2): 1 [drp] via OPHTHALMIC
  Filled 2020-11-01: qty 2.5

## 2020-11-01 MED ORDER — DORZOLAMIDE HCL-TIMOLOL MAL 2-0.5 % OP SOLN
1.0000 [drp] | Freq: Two times a day (BID) | OPHTHALMIC | Status: DC
  Administered 2020-11-01 – 2020-11-03 (×5): 1 [drp] via OPHTHALMIC
  Filled 2020-11-01 (×2): qty 10

## 2020-11-01 MED ORDER — METRONIDAZOLE 500 MG/100ML IV SOLN
500.0000 mg | Freq: Three times a day (TID) | INTRAVENOUS | Status: DC
  Administered 2020-11-01 – 2020-11-03 (×7): 500 mg via INTRAVENOUS
  Filled 2020-11-01 (×7): qty 100

## 2020-11-01 MED ORDER — SODIUM CHLORIDE 0.9% FLUSH: 10.0000 mL | Freq: Two times a day (BID) | INTRAVENOUS | Status: DC

## 2020-11-01 MED ORDER — SENNOSIDES-DOCUSATE SODIUM 8.6-50 MG PO TABS: 1.0000 | ORAL_TABLET | Freq: Every day | ORAL | Status: DC | PRN

## 2020-11-01 MED ORDER — SODIUM CHLORIDE 0.9 % IV SOLN
2.0000 g | Freq: Three times a day (TID) | INTRAVENOUS | Status: DC
  Administered 2020-11-01 – 2020-11-03 (×7): 2 g via INTRAVENOUS
  Filled 2020-11-01 (×8): qty 2

## 2020-11-01 MED ORDER — ACETAMINOPHEN 325 MG PO TABS: 650.0000 mg | ORAL_TABLET | Freq: Four times a day (QID) | ORAL | Status: DC | PRN

## 2020-11-01 MED ORDER — POLYVINYL ALCOHOL 1.4 % OP SOLN: 1.0000 [drp] | Freq: Three times a day (TID) | OPHTHALMIC | Status: DC | PRN

## 2020-11-01 MED ORDER — SODIUM CHLORIDE 0.9% FLUSH: 10.0000 mL | INTRAVENOUS | Status: DC | PRN

## 2020-11-01 MED ORDER — ENOXAPARIN SODIUM 40 MG/0.4ML IJ SOSY
40.0000 mg | PREFILLED_SYRINGE | INTRAMUSCULAR | Status: DC
  Administered 2020-11-01 – 2020-11-02 (×2): 40 mg via SUBCUTANEOUS
  Filled 2020-11-01 (×2): qty 0.4

## 2020-11-01 MED ORDER — CHLORHEXIDINE GLUCONATE CLOTH 2 % EX PADS
6.0000 | MEDICATED_PAD | Freq: Every day | CUTANEOUS | Status: DC
  Administered 2020-11-01 – 2020-11-02 (×2): 6 via TOPICAL

## 2020-11-01 NOTE — Progress Notes (Addendum)
  PROGRESS NOTE  Patient admitted earlier this morning. See H&P.   Jonathan Lewis is a 80 y.o. male with medical history significant for diet-controlled diabetes mellitus, hypertension, acoustic neuroma, gastric cancer status post distal gastrectomy, and biliary obstruction in June with cholecystitis and Enterobacter aerogenes bacteremia, now presenting to the emergency department with fever and chills.  Patient had percutaneous biliary drain and cholecystostomy tubes placed in June.  He had been experiencing increased abdominal pain and nausea few days ago and the tubes were exchanged on 10/30/2020.  He developed subjective fever and chills the day of the tube exchange but has not had any more abdominal pain or vomiting.  He describes shaking chills.    Patient seen and examined in the emergency department.  Does not appear to be in acute distress, does appear to be uncomfortable.  His fever was as high as 103.1 overnight.  Sepsis ?cholangitis -History of biliary obstruction, cholecystitis, history of Enterobacter bacteremia in June.  Status post percutaneous biliary drain, cholecystostomy tube which were exchanged 7/29 -Presented with fever 103.1, tachycardia 109 -CT A/P: Persistent, possibly slightly worsened, intra-hepatic biliary ductal dilatation in the setting of both a percutaneous transhepatic biliary drainage catheter and cholecystostomy tube in appropriate position. Correlate clinically for possible cholangitis. -Blood cultures pending -Continue cefepime, Flagyl  Chronic systolic and diastolic heart failure -Received IV fluids for concern for sepsis -Monitor  Hyponatremia -Continue to trend BMP  Gastric adenocarcinoma  -Status post distal gastrectomy 2020 -Followed by Dr. Marin Olp     Status is: Observation  The patient will require care spanning > 2 midnights and should be moved to inpatient because: IV treatments appropriate due to intensity of illness or inability to take  PO  Dispo: The patient is from: Home              Anticipated d/c is to: Home              Patient currently is not medically stable to d/c.   Difficult to place patient No       Dessa Phi, DO [facility] 11/01/2020, 12:47 PM  Available via Epic secure chat 7am-7pm After these hours, please refer to coverage provider listed on [email or web address]

## 2020-11-01 NOTE — Sepsis Progress Note (Signed)
Elink Code Sepsis Completion Note:  BC drawn before ABX administered. Had the allotted IVF replacement for kg. LA 1.8. Being admitted to Telemetry unit for further monitoring.  Carloyn Lahue DNP Warren Lacy RN [phone] AM

## 2020-11-01 NOTE — ED Notes (Signed)
Patient transported to CT 

## 2020-11-01 NOTE — Progress Notes (Signed)
Pharmacy Antibiotic Note  Jonathan Lewis is a 80 y.o. male admitted on 10/31/2020 with  a history of gastric cancer, acoustic neuroma, diabetes, hypertension, hyperlipidemia who currently has cholecystostomy tube and biliary drain who presents with fever.  Pharmacy has been consulted for cefepime dosing.  Plan: Cefepime 2gm IV q8h Follow renal function, cultures and clinical course  Height: '5\' 8"'$  (172.7 cm) Weight: 72.6 kg (160 lb) IBW/kg (Calculated) : 68.4  Temp (24hrs), Avg:102.8 F (39.3 C), Min:102.2 F (39 C), Max:103.1 F (39.5 C)  Recent Labs  Lab 10/27/20 1048 10/31/20 2032 10/31/20 2232  WBC 4.2 7.4  --   CREATININE 0.63 0.83  --   LATICACIDVEN  --  1.8 2.3*    Estimated Creatinine Clearance: 67.5 mL/min (by C-G formula based on SCr of 0.83 mg/dL).    No Known Allergies  Antimicrobials this admission: 7/30 cefepime >>  Dose adjustments this admission:   Microbiology results: 7/30 BCx:  7/30 UCx:   Thank you for allowing pharmacy to be a part of this patient's care.  Dolly Rias RPh 11/01/2020, 3:57 AM

## 2020-11-01 NOTE — H&P (Signed)
History and Physical    Jonathan Lewis [id number] DOB: 15-Jan-1941 DOA: 10/31/2020  PCP: Biagio Borg, MD   Patient coming from: Home   Chief Complaint: Fever, chills   HPI: Jonathan Lewis is a 80 y.o. male with medical history significant for diet-controlled diabetes mellitus, hypertension, acoustic neuroma, gastric cancer status post distal gastrectomy, and biliary obstruction in June with cholecystitis and Enterobacter aerogenes bacteremia, now presenting to the emergency department with fever and chills.  Patient had percutaneous biliary drain and cholecystostomy tubes placed in June.  He had been experiencing increased abdominal pain and nausea few days ago and the tubes were exchanged on 10/30/2020.  He developed subjective fever and chills the day of the tube exchange but has not had any more abdominal pain or vomiting.  He describes shaking chills.  He denies cough or shortness of breath, denies dysuria or flank pain, and notes a slight tinge of blood from his drains but no other change in the fluid.  ED Course: Upon arrival to the ED, patient is found to be febrile to 39.5 C, saturating well on room air, and with stable blood pressure.  EKG features sinus rhythm.  Chest x-ray negative for acute cardiopulmonary disease.  Chemistry panel with alkaline phosphatase 218, albumin 2.6, slight elevation in AST, and normal bilirubin.  CBC with stable normocytic anemia.  Initial lactic acid was 1.8 and then 2.3 on repeat.  CT of the abdomen and pelvis demonstrates gastric cancer with peritoneal carcinomatosis and subcapsular implant along the left hepatic lobe, as well as persistent intrahepatic biliary ductal dilatation, and percutaneous biliary drain and cholecystostomy tubes in appropriate position.  Blood and urine cultures were collected, 30 cc/kg LR was administered, the patient was started on cefepime and Flagyl.  Review of Systems:  All other systems reviewed and apart from HPI, are  negative.  Past Medical History:  Diagnosis Date   Acoustic neuroma ([place]) 06/25/2015   Allergic rhinitis 07/18/2014   Allergy    Anemia    Arthritis    BPH (benign prostatic hyperplasia)    Cancer ([place])    stage III stomach-Dx 03/2019   Carbuncle    recurrent MRSA carbuncles   Cataract    Per pt bilateral cataracts removed.   Diabetes mellitus    Type II. Per pt Dr. Jenny Reichmann took him off his dm med.   Disk prolapse    Glaucoma    Hyperlipidemia    Hypertension    Male hypogonadism 07/16/2014   Neuropathy    Pneumonia    Sinus bradycardia    chronic, asymptomatic   Sinusitis 07/29/2012    Past Surgical History:  Procedure Laterality Date   ANTERIOR CERVICAL DECOMPRESSION/DISCECTOMY FUSION 4 LEVELS N/A 01/08/2020   Procedure: ANTERIOR CERVICAL DECOMPRESSION FUSION CERVICAL 3-4, CERVICAL 4-5, CERVIAL 5-6 WITH INSTRUMENTATION AND ALLOGRAFT;  Surgeon: Phylliss Bob, MD;  Location: [facility];  Service: Orthopedics;  Laterality: N/A;   BALLOON DILATION N/A 03/12/2019   Procedure: BALLOON DILATION;  Surgeon: Rush Landmark Telford Nab., MD;  Location: [facility];  Service: Gastroenterology;  Laterality: N/A;  pyloric   BIOPSY  03/08/2019   Procedure: BIOPSY;  Surgeon: Yetta Flock, MD;  Location: [facility];  Service: Gastroenterology;;   BIOPSY  03/12/2019   Procedure: BIOPSY;  Surgeon: Irving Copas., MD;  Location: [facility];  Service: Gastroenterology;;   CATARACT EXTRACTION     x 2   COLONOSCOPY     ENDOSCOPIC RETROGRADE CHOLANGIOPANCREATOGRAPHY (ERCP) WITH PROPOFOL N/A 09/09/2020  Procedure: ENDOSCOPIC RETROGRADE CHOLANGIOPANCREATOGRAPHY (ERCP) WITH PROPOFOL;  Surgeon: Rush Landmark Telford Nab., MD;  Location: [facility];  Service: Gastroenterology;  Laterality: N/A;   ESOPHAGOGASTRODUODENOSCOPY N/A 03/08/2019   Procedure: ESOPHAGOGASTRODUODENOSCOPY (EGD);  Surgeon: Yetta Flock, MD;  Location: [facility];  Service: Gastroenterology;  Laterality: N/A;    ESOPHAGOGASTRODUODENOSCOPY (EGD) WITH PROPOFOL N/A 03/12/2019   Procedure: ESOPHAGOGASTRODUODENOSCOPY (EGD) WITH PROPOFOL;  Surgeon: Rush Landmark Telford Nab., MD;  Location: [facility];  Service: Gastroenterology;  Laterality: N/A;   FINE NEEDLE ASPIRATION  03/12/2019   Procedure: FINE NEEDLE ASPIRATION (FNA) LINEAR;  Surgeon: Irving Copas., MD;  Location: [facility];  Service: Gastroenterology;;   FOREIGN BODY REMOVAL  03/08/2019   Procedure: FOREIGN BODY REMOVAL;  Surgeon: Yetta Flock, MD;  Location: [facility];  Service: Gastroenterology;;   IR BILIARY DRAIN PLACEMENT WITH CHOLANGIOGRAM  09/09/2020   IR CHOLANGIOGRAM EXISTING TUBE  09/29/2020   IR CHOLANGIOGRAM EXISTING TUBE  09/29/2020   IR EXCHANGE BILIARY DRAIN  09/24/2020   IR EXCHANGE BILIARY DRAIN  10/30/2020   IR EXCHANGE BILIARY DRAIN  10/30/2020   IR INT EXT BILIARY DRAIN WITH CHOLANGIOGRAM  09/10/2020   IR PATIENT EVAL TECH 0-60 MINS  04/03/2019   IR PERC CHOLECYSTOSTOMY  09/24/2020   LAPAROTOMY N/A 03/19/2019   Procedure: EXPLORATORY LAPAROTOMY, distal GASTRECTOMY AND PLACEMENT OF G AND J TUBE, gastric jejunostomy;  Surgeon: Greer Pickerel, MD;  Location: [facility];  Service: General;  Laterality: N/A;   POLYPECTOMY     PORTACATH PLACEMENT N/A 03/26/2019   Procedure: INSERTION PORT-A-CATH;  Surgeon: Michael Boston, MD;  Location: [facility];  Service: General;  Laterality: N/A;   [place] INJECTION  09/09/2020   Procedure: SUBMUCOSAL TATTOO INJECTION;  Surgeon: Irving Copas., MD;  Location: [facility];  Service: Gastroenterology;;   UPPER ESOPHAGEAL ENDOSCOPIC ULTRASOUND (EUS) N/A 03/12/2019   Procedure: UPPER ESOPHAGEAL ENDOSCOPIC ULTRASOUND (EUS);  Surgeon: Irving Copas., MD;  Location: [facility];  Service: Gastroenterology;  Laterality: N/A;   UPPER GASTROINTESTINAL ENDOSCOPY     VIDEO BRONCHOSCOPY WITH ENDOBRONCHIAL ULTRASOUND N/A 05/22/2019   Procedure: VIDEO  BRONCHOSCOPY WITH ENDOBRONCHIAL ULTRASOUND;  Surgeon: Garner Nash, DO;  Location: [facility];  Service: Thoracic;  Laterality: N/A;    Social History:   reports that he quit smoking about 42 years ago. His smoking use included cigarettes. He has a 24.00 pack-year smoking history. He has never used smokeless tobacco. He reports previous alcohol use. He reports that he does not use drugs.  No Known Allergies  Family History  Problem Relation Age of Onset   Cancer Mother    Hypertension Mother    Diabetes Sister    Cancer Brother        colon   Hypertension Brother    Hyperlipidemia Brother    Stroke Brother    Hypertension Sister    Colon cancer Neg Hx    Heart attack Neg Hx    Sudden death Neg Hx    Esophageal cancer Neg Hx    Pancreatic cancer Neg Hx    Stomach cancer Neg Hx    Rectal cancer Neg Hx      Prior to Admission medications   Medication Sig Start Date End Date Taking? Authorizing Provider  dorzolamide-timolol (COSOPT) 22.3-6.8 MG/ML ophthalmic solution Place 1 drop into both eyes 2 (two) times daily. 06/15/20  Yes [provider]  famotidine (PEPCID) 20 MG tablet TAKE 1 TABLET BY MOUTH EVERY DAY Patient taking differently:  Take 20 mg by mouth as needed for heartburn or indigestion. 09/10/20  Yes Ennever, Rudell Cobb, MD  latanoprost (XALATAN) 0.005 % ophthalmic solution Place 1 drop into both eyes at bedtime.  01/09/19  Yes [provider]  lidocaine-prilocaine (EMLA) cream Apply 1 application topically as needed. Place on the port one hour before appointment. 08/24/20  Yes Volanda Napoleon, MD  meclizine (ANTIVERT) 12.5 MG tablet TAKE 1 TABLET BY MOUTH 3 TIMES DAILY AS NEEDED FOR DIZZINESS. Patient taking differently: Take 12.5 mg by mouth daily as needed for dizziness. 08/10/20  Yes Biagio Borg, MD  Multiple Vitamin (MULTIVITAMIN WITH MINERALS) TABS tablet Take 1 tablet by mouth in the morning. Men's One-A-Day Chewable   Yes [provider]   ondansetron (ZOFRAN) 8 MG tablet Take 1 tablet (8 mg total) by mouth every 8 (eight) hours as needed for nausea or vomiting. 09/16/20  Yes Hayden Pedro, PA-C  senna-docusate (SENOKOT S) 8.6-50 MG tablet Take 1 tablet by mouth daily. Patient taking differently: Take 1 tablet by mouth daily as needed for mild constipation or moderate constipation. 09/12/20  Yes Domenic Polite, MD  SYSTANE ULTRA 0.4-0.3 % SOLN Place 1 drop into both eyes 3 (three) times daily as needed (dry eyes). 01/09/19   [provider]    Physical Exam: Vitals:   11/01/20 0145 11/01/20 0200 11/01/20 0215 11/01/20 0340  BP: 108/60 103/60 (!) 122/57   Pulse: 89 91 100   Resp: 11  18   Temp:    (!) 102.2 F (39 C)  TempSrc:    Oral  SpO2: 98% 99% 99%   Weight:      Height:        Constitutional: NAD, calm  Eyes: PERTLA, lids and conjunctivae normal ENMT: Mucous membranes are moist. Posterior pharynx clear of any exudate or lesions.   Neck:  supple, no masses  Respiratory: no wheezing, no crackles. No accessory muscle use.  Cardiovascular: S1 & S2 heard, regular rate and rhythm. No extremity edema.   Abdomen: No distension, soft, no rebound pain or guarding. Bowel sounds active.  Musculoskeletal: no clubbing / cyanosis. No joint deformity upper and lower extremities.   Skin: no significant rashes, lesions, ulcers. Warm, dry, well-perfused. Neurologic: CN 2-12 grossly intact. Sensation intact. Moving all extremities.  Psychiatric: Alert and oriented to person, place, and situation. Very pleasant and cooperative.    Labs and Imaging on Admission: I have personally reviewed following labs and imaging studies  CBC: Recent Labs  Lab 10/27/20 1048 10/31/20 2032  WBC 4.2 7.4  NEUTROABS 2.2 4.0  HGB 10.4* 10.2*  HCT 31.7* 30.5*  MCV 86.4 85.2  PLT 159 [id number]   Basic Metabolic Panel: Recent Labs  Lab 10/27/20 1048 10/31/20 2032  NA 133* 129*  K 4.2 3.8  CL 100 99  CO2 27 23  GLUCOSE 120*  124*  BUN 10 13  CREATININE 0.63 0.83  CALCIUM 9.2 8.1*   GFR: Estimated Creatinine Clearance: 67.5 mL/min (by C-G formula based on SCr of 0.83 mg/dL). Liver Function Tests: Recent Labs  Lab 10/27/20 1048 10/31/20 2032  AST 37 44*  ALT 27 35  ALKPHOS 257* 218*  BILITOT 0.8 1.0  PROT 6.7 6.7  ALBUMIN 3.0* 2.6*   No results for input(s): LIPASE, AMYLASE in the last 168 hours. No results for input(s): AMMONIA in the last 168 hours. Coagulation Profile: Recent Labs  Lab 10/31/20 2032  INR 1.1   Cardiac Enzymes: No results for input(s):  CKTOTAL, CKMB, CKMBINDEX, TROPONINI in the last 168 hours. BNP (last 3 results) No results for input(s): PROBNP in the last 8760 hours. HbA1C: No results for input(s): HGBA1C in the last 72 hours. CBG: No results for input(s): GLUCAP in the last 168 hours. Lipid Profile: No results for input(s): CHOL, HDL, LDLCALC, TRIG, CHOLHDL, LDLDIRECT in the last 72 hours. Thyroid Function Tests: No results for input(s): TSH, T4TOTAL, FREET4, T3FREE, THYROIDAB in the last 72 hours. Anemia Panel: No results for input(s): VITAMINB12, FOLATE, FERRITIN, TIBC, IRON, RETICCTPCT in the last 72 hours. Urine analysis:    Component Value Date/Time   COLORURINE YELLOW 10/31/2020 2200   APPEARANCEUR CLEAR 10/31/2020 2200   LABSPEC 1.015 10/31/2020 2200   PHURINE 5.0 10/31/2020 2200   GLUCOSEU NEGATIVE 10/31/2020 2200   GLUCOSEU NEGATIVE 04/22/2020 1246   HGBUR NEGATIVE 10/31/2020 2200   HGBUR negative 11/30/2009 1450   BILIRUBINUR NEGATIVE 10/31/2020 2200   KETONESUR NEGATIVE 10/31/2020 2200   PROTEINUR NEGATIVE 10/31/2020 2200   UROBILINOGEN 0.2 04/22/2020 1246   NITRITE NEGATIVE 10/31/2020 2200   LEUKOCYTESUR NEGATIVE 10/31/2020 2200   Sepsis Labs: '@LABRCNTIP'$ (procalcitonin:4,lacticidven:4) ) Recent Results (from the past 240 hour(s))  Resp Panel by RT-PCR (Flu A&B, Covid) Nasopharyngeal Swab     Status: None   Collection Time: 10/31/20  9:04 PM    Specimen: Nasopharyngeal Swab; Nasopharyngeal(NP) swabs in vial transport medium  Result Value Ref Range Status   SARS Coronavirus 2 by RT PCR NEGATIVE NEGATIVE Final    Comment: (NOTE) SARS-CoV-2 target nucleic acids are NOT DETECTED.  The SARS-CoV-2 RNA is generally detectable in upper respiratory specimens during the acute phase of infection. The lowest concentration of SARS-CoV-2 viral copies this assay can detect is 138 copies/mL. A negative result does not preclude SARS-Cov-2 infection and should not be used as the sole basis for treatment or other patient management decisions. A negative result may occur with  improper specimen collection/handling, submission of specimen other than nasopharyngeal swab, presence of viral mutation(s) within the areas targeted by this assay, and inadequate number of viral copies(<138 copies/mL). A negative result must be combined with clinical observations, patient history, and epidemiological information. The expected result is Negative.  Fact Sheet for Patients:  EntrepreneurPulse.com.au  Fact Sheet for Healthcare Providers:  IncredibleEmployment.be  This test is no t yet approved or cleared by the [place] FDA and  has been authorized for detection and/or diagnosis of SARS-CoV-2 by FDA under an Emergency Use Authorization (EUA). This EUA will remain  in effect (meaning this test can be used) for the duration of the COVID-19 declaration under Section 564(b)(1) of the Act, 21 U.S.C.section 360bbb-3(b)(1), unless the authorization is terminated  or revoked sooner.       Influenza A by PCR NEGATIVE NEGATIVE Final   Influenza B by PCR NEGATIVE NEGATIVE Final    Comment: (NOTE) The Xpert Xpress SARS-CoV-2/FLU/RSV plus assay is intended as an aid in the diagnosis of influenza from Nasopharyngeal swab specimens and should not be used as a sole basis for treatment. Nasal washings and aspirates are  unacceptable for Xpert Xpress SARS-CoV-2/FLU/RSV testing.  Fact Sheet for Patients: EntrepreneurPulse.com.au  Fact Sheet for Healthcare Providers: IncredibleEmployment.be  This test is not yet approved or cleared by the [place] FDA and has been authorized for detection and/or diagnosis of SARS-CoV-2 by FDA under an Emergency Use Authorization (EUA). This EUA will remain in effect (meaning this test can be used) for the duration of the COVID-19 declaration under Section 564(b)(1) of  the Act, 21 U.S.C. section 360bbb-3(b)(1), unless the authorization is terminated or revoked.  Performed at [facility], [place] [address] [place], [place] [address]      Radiological Exams on Admission: DG Chest 2 View  Result Date: 10/31/2020 CLINICAL DATA:  Sepsis EXAM: CHEST - 2 VIEW COMPARISON:  09/21/2020 FINDINGS: Lungs are well expanded, symmetric, and clear. No pneumothorax or pleural effusion. Cardiac size within normal limits. Pulmonary vascularity is normal. Right internal jugular chest port tip is seen at the superior cavoatrial junction, unchanged osseous structures are age-appropriate. No acute bone abnormality. IMPRESSION: No active cardiopulmonary disease. Electronically Signed   By: Fidela Salisbury MD   On: 10/31/2020 21:53   CT ABDOMEN PELVIS W CONTRAST  Result Date: 11/01/2020 CLINICAL DATA:  Abdominal pain, fever. complaining of fever and chills that started 7/29. Patient has stomach cancer EXAM: CT ABDOMEN AND PELVIS WITH CONTRAST TECHNIQUE: Multidetector CT imaging of the abdomen and pelvis was performed using the standard protocol following bolus administration of intravenous contrast. CONTRAST:  23m OMNIPAQUE IOHEXOL 350 MG/ML SOLN COMPARISON:  CT abdomen pelvis 09/21/2020 FINDINGS: Lower chest: Calcified micronodule within the right middle lobe. Subsegmental bilateral lower lobe atelectasis. Central venous catheter terminates  at the superior cavoatrial junction. Hepatobiliary: Redemonstration of less conspicuous 1.4 cm hepatic lesion (2:28). Interval resolution of a hydropic gallbladder. Interval placement of a cholecystostomy tube terminating within the gallbladder lumen. No CT evidence of definite gallstones, gallbladder wall thickening, or pericholecystic fluid. Redemonstration of a transhepatic common bile duct stent with pigtail terminating within the second portion of the duodenum just distal to the ampullary. Persistent, possibly slightly worsened, intrahepatic biliary ductal dilatation. No definite biliary duct wall thickening. Pancreas: No focal lesion. Normal pancreatic contour. No surrounding inflammatory changes. No main pancreatic ductal dilatation. Spleen: Normal in size without focal abnormality. Adrenals/Urinary Tract: No adrenal nodule bilaterally. Bilateral kidneys enhance symmetrically. Couple calcified stones within the right kidney measuring up to 3 mm. No left nephrolithiasis. No hydronephrosis. No hydroureter. No ureterolithiasis bilaterally. The urinary bladder is unremarkable. Stomach/Bowel: Surgical changes related to partial gastric resection again noted. Stomach is within normal limits. No evidence of bowel wall thickening or dilatation. Appendix appears normal. Vascular/Lymphatic: No abdominal aorta or iliac aneurysm. Mild atherosclerotic plaque of the aorta and its branches. No abdominal, pelvic, or inguinal lymphadenopathy. Reproductive: Prostate is unremarkable. Other: Right upper quadrant peritoneal soft tissue densities measuring 0.8 10, 0.7, 0.8 cm. More finding within the left upper abdomen measuring 1.2 cm. Other regions of peritoneal carcinomatosis also noted (2:38). Trace perihepatic free fluid (5:44). No intraperitoneal free gas. No organized fluid collection. Musculoskeletal: No abdominal wall hernia or abnormality. No suspicious lytic or blastic osseous lesions. No acute displaced fracture.  Multilevel degenerative changes of the spine. IMPRESSION: 1. Gastric cancer with peritoneal carcinomatosis and subcapsular implant along the left hepatic lobe. 2. Persistent, possibly slightly worsened, intra-hepatic biliary ductal dilatation in the setting of both a percutaneous transhepatic biliary drainage catheter and cholecystostomy tube in appropriate position. Correlate clinically for possible cholangitis. Electronically Signed   By: MIven FinnM.D.   On: 11/01/2020 01:59   IR EXCHANGE BILIARY DRAIN  Result Date: 10/30/2020 INDICATION: History of gastric cancer complicated by development of obstructive jaundice, post placement of internal/external drainage catheter on 09/10/2020 with subsequent fluoroscopic guided exchange and up sizing 6/23/2 as well as image guided placement a cholecystostomy tube 09/04/2020 Patient presents today for routine fluoroscopic guided exchange of both catheters. The patient's biliary drainage catheter remains capped for  which he has tolerated well. The cholecystostomy tube remains connected gravity bag. EXAM: 1. FLUOROSCOPIC GUIDED LEFT-SIDED PERCUTANEOUS BILIARY DRAINAGE CATHETER 2. FLUOROSCOPIC GUIDED CHOLECYSTOSTOMY TUBE EXCHANGE COMPARISON:  COMPARISON Cholangiogram via existing percutaneous biliary drainage catheter and cholecystostomy tube-09/29/2020 CONTRAST:  A total of 25 cc Omnipaque 300 was administered into the gallbladder lumen and the biliary tree. FLUOROSCOPY TIME:  1 minute, 24 seconds (24 mGy) COMPLICATIONS: None immediate. TECHNIQUE: Informed written consent was obtained from the patient after a discussion of the risks, benefits and alternatives to treatment. Questions regarding the procedure were encouraged and answered. A timeout was performed prior to the initiation of the procedure. The external portion of the existing left-sided percutaneous biliary drainage catheter as well as the cholecystostomy tube and surrounding skin were prepped and draped  in the usual sterile fashion. A sterile drape was applied covering the operative field. Maximum barrier sterile technique with sterile gowns and gloves were used for the procedure. A timeout was performed prior to the initiation of the procedure. Preprocedural spot fluoroscopic image was obtained both the cholecystostomy tube as well as the biliary catheter. A pre procedural spot fluoroscopic image was obtained after contrast was injected via the existing biliary drainage catheter. The existing biliary drainage catheter was cut and cannulated with a stiff Glidewire wire which was coiled within the proximal small bowel. Under intermittent fluoroscopic guidance, the existing PBD was exchanged for a new 12 [place] PBD. Small amount of contrast was injected via the exchanged biliary drainage catheter and a post exchange spot fluoroscopic image was obtained. Preprocedural spot fluoroscopic image was obtained following injection of small amount of contrast via the existing cholecystostomy tube. Next a cholecystostomy tube was cut and cannulated with a short Amplatz wire which was coiled within gallbladder lumen. Fluoroscopic guidance the existing cholecystostomy tube was exchanged for a new, 10 French drainage catheter with end coiled locked within gallbladder lumen. Small amount of contrast was injected demonstrating appropriate position functionality. Both catheters were secured to the skin entrance site within interrupted sutures and StatLock devices. The biliary drainage catheter was capped and the cholecystostomy tube was reconnected to a gravity bag was previously. Dressings were applied. The patient tolerated the above procedures well without immediate postprocedural complication. FINDINGS: The existing percutaneous biliary catheter and cholecystostomy tube are both appropriately positioned and functioning. After successful fluoroscopic guided exchange, the new 12 French percutaneous biliary catheter is  appropriately positioned with end coiled and locked within the proximal duodenum. After fluoroscopic guided exchange, the new 10 [place] cholecystostomy tube is appropriately positioned with end coiled and locked within the gallbladder lumen. IMPRESSION: 1. Successful fluoroscopic guided exchange of left sided 12 French percutaneous biliary drainage catheter. 2. Successful fluoroscopic guided exchange 10 French cholecystostomy tube. PLAN: - Continued current management of both drainage catheters including capping of the biliary drainage catheter and maintaining the cholecystostomy tube to external drainage. Neither catheter requires routine flushing. - The patient should return for routine fluoroscopic guided exchange of both drainage catheters and approximately 8 weeks. Note, consideration for internal biliary stent placement will be determined following next post treatment staging CT. Electronically Signed   By: Sandi Mariscal M.D.   On: 10/30/2020 16:36   IR EXCHANGE BILIARY DRAIN  Result Date: 10/30/2020 INDICATION: History of gastric cancer complicated by development of obstructive jaundice, post placement of internal/external drainage catheter on 09/10/2020 with subsequent fluoroscopic guided exchange and up sizing 6/23/2 as well as image guided placement a cholecystostomy tube 09/04/2020 Patient presents today for routine fluoroscopic guided  exchange of both catheters. The patient's biliary drainage catheter remains capped for which he has tolerated well. The cholecystostomy tube remains connected gravity bag. EXAM: 1. FLUOROSCOPIC GUIDED LEFT-SIDED PERCUTANEOUS BILIARY DRAINAGE CATHETER 2. FLUOROSCOPIC GUIDED CHOLECYSTOSTOMY TUBE EXCHANGE COMPARISON:  COMPARISON Cholangiogram via existing percutaneous biliary drainage catheter and cholecystostomy tube-09/29/2020 CONTRAST:  A total of 25 cc Omnipaque 300 was administered into the gallbladder lumen and the biliary tree. FLUOROSCOPY TIME:  1 minute, 24 seconds  (24 mGy) COMPLICATIONS: None immediate. TECHNIQUE: Informed written consent was obtained from the patient after a discussion of the risks, benefits and alternatives to treatment. Questions regarding the procedure were encouraged and answered. A timeout was performed prior to the initiation of the procedure. The external portion of the existing left-sided percutaneous biliary drainage catheter as well as the cholecystostomy tube and surrounding skin were prepped and draped in the usual sterile fashion. A sterile drape was applied covering the operative field. Maximum barrier sterile technique with sterile gowns and gloves were used for the procedure. A timeout was performed prior to the initiation of the procedure. Preprocedural spot fluoroscopic image was obtained both the cholecystostomy tube as well as the biliary catheter. A pre procedural spot fluoroscopic image was obtained after contrast was injected via the existing biliary drainage catheter. The existing biliary drainage catheter was cut and cannulated with a stiff Glidewire wire which was coiled within the proximal small bowel. Under intermittent fluoroscopic guidance, the existing PBD was exchanged for a new 12 [place] PBD. Small amount of contrast was injected via the exchanged biliary drainage catheter and a post exchange spot fluoroscopic image was obtained. Preprocedural spot fluoroscopic image was obtained following injection of small amount of contrast via the existing cholecystostomy tube. Next a cholecystostomy tube was cut and cannulated with a short Amplatz wire which was coiled within gallbladder lumen. Fluoroscopic guidance the existing cholecystostomy tube was exchanged for a new, 10 French drainage catheter with end coiled locked within gallbladder lumen. Small amount of contrast was injected demonstrating appropriate position functionality. Both catheters were secured to the skin entrance site within interrupted sutures and StatLock devices.  The biliary drainage catheter was capped and the cholecystostomy tube was reconnected to a gravity bag was previously. Dressings were applied. The patient tolerated the above procedures well without immediate postprocedural complication. FINDINGS: The existing percutaneous biliary catheter and cholecystostomy tube are both appropriately positioned and functioning. After successful fluoroscopic guided exchange, the new 12 French percutaneous biliary catheter is appropriately positioned with end coiled and locked within the proximal duodenum. After fluoroscopic guided exchange, the new 10 [place] cholecystostomy tube is appropriately positioned with end coiled and locked within the gallbladder lumen. IMPRESSION: 1. Successful fluoroscopic guided exchange of left sided 12 French percutaneous biliary drainage catheter. 2. Successful fluoroscopic guided exchange 10 French cholecystostomy tube. PLAN: - Continued current management of both drainage catheters including capping of the biliary drainage catheter and maintaining the cholecystostomy tube to external drainage. Neither catheter requires routine flushing. - The patient should return for routine fluoroscopic guided exchange of both drainage catheters and approximately 8 weeks. Note, consideration for internal biliary stent placement will be determined following next post treatment staging CT. Electronically Signed   By: Sandi Mariscal M.D.   On: 10/30/2020 16:36    EKG: Independently reviewed. Sinus rhythm.   Assessment/Plan   1. SIRS  - Patient with hx of biliary obstruction from metastatic disease, cholecystitis, and Enterobacter aerogenes bacteremia in June presents with fevers and shaking chills  - There is  no respiratory or urinary s/s, no abdominal pain, and bilirubin remains normal  - Blood and urine cultures were collected in ED, 30 cc/kg LR was given, and he was started on cefepime and Flagyl  - Trend lactate, check/trend procalcitonin, continue  empiric antibiotics, follow cultures and clinical course    2. Hx of bilary obstruction, cholecystitis  - S/p percutaneous biliary drains and cholecystostomy tubes  - Tubes were exchanged 10/30/20  - Persistent intrahepatic bilary dilation noted on CT; bilirubin remains normal  - Continue drain care, empiric antibiotics   3. Chronic systolic and diastolic CHF  - EF was [id number] with grade 2 diastolic dysfunction in June  - Appears hypovolemic on admission, received 30 cc/kg LR in ED, will SLIV for now and monitor volume status    4. Hyponatremia  - Serum sodium is 129 on admission in setting of hypovolemia  - He was fluid-resuscitated in ED, repeat chem panel in am    5. Gastric cancer  - S/p distal gastrectomy in December 2020  - He had biliary obstruction from periportal LN treated with radiation and percutaneous biliary drain  - Followed by oncology, considering additional treatments with carboplatin/Taxol   6. Anemia  - Hgb stable, no overt bleeding      DVT prophylaxis: Lovenox Code Status: DNR, confirmed with patient  Level of Care: Level of care: Telemetry Family Communication: Wife updated at bedside Disposition Plan:  Patient is from: Home  Anticipated d/c is to: Home  Anticipated d/c date is: Possibly as early as 11/02/20 Patient currently: pending clinical course, cultures  Consults called: none  Admission status: Observation     Vianne Bulls, MD [facility]  11/01/2020, 4:02 AM

## 2020-11-01 NOTE — Progress Notes (Signed)
80 y.o. male  History of gastric cancer s/p gastrectomy in 2020 with obstructive jaundice.  Failed ERCP stent placement. IR placed biliary drain on 6.9.22, It  was upsize to a 12 Fr on 6.23.22. A cholecystomy tube was also placed on 6.23.22.  Both drains last exchanged on 7.29.22.the biliary drain was capped at that time. The patient came into the ED  at [facility] on 7.31.22 with fever and chills. Found to be tachycardic with hyperbilirubinemia (1.5) and a lactic acid of 2.4. CT abd pelvis shows drains to be both in a good position  Patient was seen at bedside with his son. Both the biliary drain and cholecystostomy tube were gently flushed. The biliary drain was then placed to a gravity bag with a rapid return of bright yellow fluid.   Further steps if any to be discussed.

## 2020-11-01 NOTE — ED Notes (Signed)
Pt given breakfast tray

## 2020-11-01 NOTE — ED Notes (Signed)
Pt's bed was soiled. Pt's lien and gown was changed.

## 2020-11-01 NOTE — ED Notes (Signed)
Pt given lunch tray.

## 2020-11-02 DIAGNOSIS — A419 Sepsis, unspecified organism: Principal | ICD-10-CM

## 2020-11-02 LAB — COMPREHENSIVE METABOLIC PANEL
ALT: 27 U/L (ref 0–44)
AST: 32 U/L (ref 15–41)
Albumin: 2.2 g/dL — ABNORMAL LOW (ref 3.5–5.0)
Alkaline Phosphatase: 167 U/L — ABNORMAL HIGH (ref 38–126)
Anion gap: 6 (ref 5–15)
BUN: 14 mg/dL (ref 8–23)
CO2: 24 mmol/L (ref 22–32)
Calcium: 7.8 mg/dL — ABNORMAL LOW (ref 8.9–10.3)
Chloride: 100 mmol/L (ref 98–111)
Creatinine, Ser: 0.7 mg/dL (ref 0.61–1.24)
GFR, Estimated: >60 mL/min (ref >60)
Glucose, Bld: 104 mg/dL — ABNORMAL HIGH (ref 70–99)
Potassium: 3.5 mmol/L (ref 3.5–5.1)
Sodium: 130 mmol/L — ABNORMAL LOW (ref 135–145)
Total Bilirubin: 1.4 mg/dL — ABNORMAL HIGH (ref 0.3–1.2)
Total Protein: 6.1 g/dL — ABNORMAL LOW (ref 6.5–8.1)

## 2020-11-02 LAB — CBC
HCT: 27.7 % — ABNORMAL LOW (ref 39.0–52.0)
Hemoglobin: 9.1 g/dL — ABNORMAL LOW (ref 13.0–17.0)
MCH: 28.8 pg (ref 26.0–34.0)
MCHC: 32.9 g/dL (ref 30.0–36.0)
MCV: 87.7 fL (ref 80.0–100.0)
Platelets: 122 10*3/uL — ABNORMAL LOW (ref 150–400)
RBC: 3.16 MIL/uL — ABNORMAL LOW (ref 4.22–5.81)
RDW: 14.8 % (ref 11.5–15.5)
WBC: 5.6 10*3/uL (ref 4.0–10.5)
nRBC: 0 % (ref 0.0–0.2)

## 2020-11-02 LAB — URINE CULTURE: Culture: NO GROWTH

## 2020-11-02 LAB — PROCALCITONIN: Procalcitonin: 0.52 ng/mL

## 2020-11-02 MED ORDER — ENSURE ENLIVE PO LIQD
237.0000 mL | Freq: Three times a day (TID) | ORAL | Status: DC
  Administered 2020-11-02 (×3): 237 mL via ORAL

## 2020-11-02 MED ORDER — ADULT MULTIVITAMIN W/MINERALS CH
1.0000 | ORAL_TABLET | Freq: Every day | ORAL | Status: DC
  Administered 2020-11-02 – 2020-11-03 (×2): 1 via ORAL
  Filled 2020-11-02 (×2): qty 1

## 2020-11-02 NOTE — Progress Notes (Signed)
Referring Physician(s): Choi,J  Supervising Physician: Ruthann Cancer  Patient Status:  [facility] - In-pt  Chief Complaint: Recent fever/chills,?cholangitis, gastric cancer, obstructive jaundice   Subjective: Patient feeling a little bit better today; denies worsening abdominal pain, vomiting.  Has had some intermittent nausea.  Currently afebrile and on IV antibiotics   Allergies: Patient has no known allergies.  Medications: Prior to Admission medications   Medication Sig Start Date End Date Taking? Authorizing Provider  dorzolamide-timolol (COSOPT) 22.3-6.8 MG/ML ophthalmic solution Place 1 drop into both eyes 2 (two) times daily. 06/15/20  Yes [provider]  famotidine (PEPCID) 20 MG tablet TAKE 1 TABLET BY MOUTH EVERY DAY Patient taking differently: Take 20 mg by mouth as needed for heartburn or indigestion. 09/10/20  Yes Ennever, Rudell Cobb, MD  latanoprost (XALATAN) 0.005 % ophthalmic solution Place 1 drop into both eyes at bedtime.  01/09/19  Yes [provider]  lidocaine-prilocaine (EMLA) cream Apply 1 application topically as needed. Place on the port one hour before appointment. 08/24/20  Yes Volanda Napoleon, MD  meclizine (ANTIVERT) 12.5 MG tablet TAKE 1 TABLET BY MOUTH 3 TIMES DAILY AS NEEDED FOR DIZZINESS. Patient taking differently: Take 12.5 mg by mouth daily as needed for dizziness. 08/10/20  Yes Biagio Borg, MD  Multiple Vitamin (MULTIVITAMIN WITH MINERALS) TABS tablet Take 1 tablet by mouth in the morning. Men's One-A-Day Chewable   Yes [provider]  ondansetron (ZOFRAN) 8 MG tablet Take 1 tablet (8 mg total) by mouth every 8 (eight) hours as needed for nausea or vomiting. 09/16/20  Yes Hayden Pedro, PA-C  senna-docusate (SENOKOT S) 8.6-50 MG tablet Take 1 tablet by mouth daily. Patient taking differently: Take 1 tablet by mouth daily as needed for mild constipation or moderate constipation. 09/12/20  Yes Domenic Polite, MD  SYSTANE  ULTRA 0.4-0.3 % SOLN Place 1 drop into both eyes 3 (three) times daily as needed (dry eyes). 01/09/19   [provider]     Vital Signs: BP 120/76 (BP Location: Right Arm)   Pulse 76   Temp 98.9 F (37.2 C) (Oral)   Resp 16   Ht '5\' 8"'$  (1.727 m)   Wt 160 lb (72.6 kg)   SpO2 100%   BMI 24.33 kg/m   Physical Exam awake, alert.  Gallbladder drain and biliary drain are intact, insertion sites okay, not significantly tender to palpation, Output from biliary drain 350 cc green bile, gallbladder drain 100 cc, both drains irrigated without difficulty Imaging: DG Chest 2 View  Result Date: 10/31/2020 CLINICAL DATA:  Sepsis EXAM: CHEST - 2 VIEW COMPARISON:  09/21/2020 FINDINGS: Lungs are well expanded, symmetric, and clear. No pneumothorax or pleural effusion. Cardiac size within normal limits. Pulmonary vascularity is normal. Right internal jugular chest port tip is seen at the superior cavoatrial junction, unchanged osseous structures are age-appropriate. No acute bone abnormality. IMPRESSION: No active cardiopulmonary disease. Electronically Signed   By: Fidela Salisbury MD   On: 10/31/2020 21:53   CT ABDOMEN PELVIS W CONTRAST  Result Date: 11/01/2020 CLINICAL DATA:  Abdominal pain, fever. complaining of fever and chills that started 7/29. Patient has stomach cancer EXAM: CT ABDOMEN AND PELVIS WITH CONTRAST TECHNIQUE: Multidetector CT imaging of the abdomen and pelvis was performed using the standard protocol following bolus administration of intravenous contrast. CONTRAST:  41m OMNIPAQUE IOHEXOL 350 MG/ML SOLN COMPARISON:  CT abdomen pelvis 09/21/2020 FINDINGS: Lower chest: Calcified micronodule within the right middle lobe. Subsegmental bilateral lower lobe atelectasis.  Central venous catheter terminates at the superior cavoatrial junction. Hepatobiliary: Redemonstration of less conspicuous 1.4 cm hepatic lesion (2:28). Interval resolution of a hydropic gallbladder. Interval placement of a  cholecystostomy tube terminating within the gallbladder lumen. No CT evidence of definite gallstones, gallbladder wall thickening, or pericholecystic fluid. Redemonstration of a transhepatic common bile duct stent with pigtail terminating within the second portion of the duodenum just distal to the ampullary. Persistent, possibly slightly worsened, intrahepatic biliary ductal dilatation. No definite biliary duct wall thickening. Pancreas: No focal lesion. Normal pancreatic contour. No surrounding inflammatory changes. No main pancreatic ductal dilatation. Spleen: Normal in size without focal abnormality. Adrenals/Urinary Tract: No adrenal nodule bilaterally. Bilateral kidneys enhance symmetrically. Couple calcified stones within the right kidney measuring up to 3 mm. No left nephrolithiasis. No hydronephrosis. No hydroureter. No ureterolithiasis bilaterally. The urinary bladder is unremarkable. Stomach/Bowel: Surgical changes related to partial gastric resection again noted. Stomach is within normal limits. No evidence of bowel wall thickening or dilatation. Appendix appears normal. Vascular/Lymphatic: No abdominal aorta or iliac aneurysm. Mild atherosclerotic plaque of the aorta and its branches. No abdominal, pelvic, or inguinal lymphadenopathy. Reproductive: Prostate is unremarkable. Other: Right upper quadrant peritoneal soft tissue densities measuring 0.8 10, 0.7, 0.8 cm. More finding within the left upper abdomen measuring 1.2 cm. Other regions of peritoneal carcinomatosis also noted (2:38). Trace perihepatic free fluid (5:44). No intraperitoneal free gas. No organized fluid collection. Musculoskeletal: No abdominal wall hernia or abnormality. No suspicious lytic or blastic osseous lesions. No acute displaced fracture. Multilevel degenerative changes of the spine. IMPRESSION: 1. Gastric cancer with peritoneal carcinomatosis and subcapsular implant along the left hepatic lobe. 2. Persistent, possibly slightly  worsened, intra-hepatic biliary ductal dilatation in the setting of both a percutaneous transhepatic biliary drainage catheter and cholecystostomy tube in appropriate position. Correlate clinically for possible cholangitis. Electronically Signed   By: Iven Finn M.D.   On: 11/01/2020 01:59   IR EXCHANGE BILIARY DRAIN  Result Date: 10/30/2020 INDICATION: History of gastric cancer complicated by development of obstructive jaundice, post placement of internal/external drainage catheter on 09/10/2020 with subsequent fluoroscopic guided exchange and up sizing 6/23/2 as well as image guided placement a cholecystostomy tube 09/04/2020 Patient presents today for routine fluoroscopic guided exchange of both catheters. The patient's biliary drainage catheter remains capped for which he has tolerated well. The cholecystostomy tube remains connected gravity bag. EXAM: 1. FLUOROSCOPIC GUIDED LEFT-SIDED PERCUTANEOUS BILIARY DRAINAGE CATHETER 2. FLUOROSCOPIC GUIDED CHOLECYSTOSTOMY TUBE EXCHANGE COMPARISON:  COMPARISON Cholangiogram via existing percutaneous biliary drainage catheter and cholecystostomy tube-09/29/2020 CONTRAST:  A total of 25 cc Omnipaque 300 was administered into the gallbladder lumen and the biliary tree. FLUOROSCOPY TIME:  1 minute, 24 seconds (24 mGy) COMPLICATIONS: None immediate. TECHNIQUE: Informed written consent was obtained from the patient after a discussion of the risks, benefits and alternatives to treatment. Questions regarding the procedure were encouraged and answered. A timeout was performed prior to the initiation of the procedure. The external portion of the existing left-sided percutaneous biliary drainage catheter as well as the cholecystostomy tube and surrounding skin were prepped and draped in the usual sterile fashion. A sterile drape was applied covering the operative field. Maximum barrier sterile technique with sterile gowns and gloves were used for the procedure. A timeout was  performed prior to the initiation of the procedure. Preprocedural spot fluoroscopic image was obtained both the cholecystostomy tube as well as the biliary catheter. A pre procedural spot fluoroscopic image was obtained after contrast was injected via the existing  biliary drainage catheter. The existing biliary drainage catheter was cut and cannulated with a stiff Glidewire wire which was coiled within the proximal small bowel. Under intermittent fluoroscopic guidance, the existing PBD was exchanged for a new 12 [place] PBD. Small amount of contrast was injected via the exchanged biliary drainage catheter and a post exchange spot fluoroscopic image was obtained. Preprocedural spot fluoroscopic image was obtained following injection of small amount of contrast via the existing cholecystostomy tube. Next a cholecystostomy tube was cut and cannulated with a short Amplatz wire which was coiled within gallbladder lumen. Fluoroscopic guidance the existing cholecystostomy tube was exchanged for a new, 10 French drainage catheter with end coiled locked within gallbladder lumen. Small amount of contrast was injected demonstrating appropriate position functionality. Both catheters were secured to the skin entrance site within interrupted sutures and StatLock devices. The biliary drainage catheter was capped and the cholecystostomy tube was reconnected to a gravity bag was previously. Dressings were applied. The patient tolerated the above procedures well without immediate postprocedural complication. FINDINGS: The existing percutaneous biliary catheter and cholecystostomy tube are both appropriately positioned and functioning. After successful fluoroscopic guided exchange, the new 12 French percutaneous biliary catheter is appropriately positioned with end coiled and locked within the proximal duodenum. After fluoroscopic guided exchange, the new 10 [place] cholecystostomy tube is appropriately positioned with end coiled and  locked within the gallbladder lumen. IMPRESSION: 1. Successful fluoroscopic guided exchange of left sided 12 French percutaneous biliary drainage catheter. 2. Successful fluoroscopic guided exchange 10 French cholecystostomy tube. PLAN: - Continued current management of both drainage catheters including capping of the biliary drainage catheter and maintaining the cholecystostomy tube to external drainage. Neither catheter requires routine flushing. - The patient should return for routine fluoroscopic guided exchange of both drainage catheters and approximately 8 weeks. Note, consideration for internal biliary stent placement will be determined following next post treatment staging CT. Electronically Signed   By: Sandi Mariscal M.D.   On: 10/30/2020 16:36   IR EXCHANGE BILIARY DRAIN  Result Date: 10/30/2020 INDICATION: History of gastric cancer complicated by development of obstructive jaundice, post placement of internal/external drainage catheter on 09/10/2020 with subsequent fluoroscopic guided exchange and up sizing 6/23/2 as well as image guided placement a cholecystostomy tube 09/04/2020 Patient presents today for routine fluoroscopic guided exchange of both catheters. The patient's biliary drainage catheter remains capped for which he has tolerated well. The cholecystostomy tube remains connected gravity bag. EXAM: 1. FLUOROSCOPIC GUIDED LEFT-SIDED PERCUTANEOUS BILIARY DRAINAGE CATHETER 2. FLUOROSCOPIC GUIDED CHOLECYSTOSTOMY TUBE EXCHANGE COMPARISON:  COMPARISON Cholangiogram via existing percutaneous biliary drainage catheter and cholecystostomy tube-09/29/2020 CONTRAST:  A total of 25 cc Omnipaque 300 was administered into the gallbladder lumen and the biliary tree. FLUOROSCOPY TIME:  1 minute, 24 seconds (24 mGy) COMPLICATIONS: None immediate. TECHNIQUE: Informed written consent was obtained from the patient after a discussion of the risks, benefits and alternatives to treatment. Questions regarding the  procedure were encouraged and answered. A timeout was performed prior to the initiation of the procedure. The external portion of the existing left-sided percutaneous biliary drainage catheter as well as the cholecystostomy tube and surrounding skin were prepped and draped in the usual sterile fashion. A sterile drape was applied covering the operative field. Maximum barrier sterile technique with sterile gowns and gloves were used for the procedure. A timeout was performed prior to the initiation of the procedure. Preprocedural spot fluoroscopic image was obtained both the cholecystostomy tube as well as the biliary catheter. A pre procedural  spot fluoroscopic image was obtained after contrast was injected via the existing biliary drainage catheter. The existing biliary drainage catheter was cut and cannulated with a stiff Glidewire wire which was coiled within the proximal small bowel. Under intermittent fluoroscopic guidance, the existing PBD was exchanged for a new 12 [place] PBD. Small amount of contrast was injected via the exchanged biliary drainage catheter and a post exchange spot fluoroscopic image was obtained. Preprocedural spot fluoroscopic image was obtained following injection of small amount of contrast via the existing cholecystostomy tube. Next a cholecystostomy tube was cut and cannulated with a short Amplatz wire which was coiled within gallbladder lumen. Fluoroscopic guidance the existing cholecystostomy tube was exchanged for a new, 10 French drainage catheter with end coiled locked within gallbladder lumen. Small amount of contrast was injected demonstrating appropriate position functionality. Both catheters were secured to the skin entrance site within interrupted sutures and StatLock devices. The biliary drainage catheter was capped and the cholecystostomy tube was reconnected to a gravity bag was previously. Dressings were applied. The patient tolerated the above procedures well without  immediate postprocedural complication. FINDINGS: The existing percutaneous biliary catheter and cholecystostomy tube are both appropriately positioned and functioning. After successful fluoroscopic guided exchange, the new 12 French percutaneous biliary catheter is appropriately positioned with end coiled and locked within the proximal duodenum. After fluoroscopic guided exchange, the new 10 [place] cholecystostomy tube is appropriately positioned with end coiled and locked within the gallbladder lumen. IMPRESSION: 1. Successful fluoroscopic guided exchange of left sided 12 French percutaneous biliary drainage catheter. 2. Successful fluoroscopic guided exchange 10 French cholecystostomy tube. PLAN: - Continued current management of both drainage catheters including capping of the biliary drainage catheter and maintaining the cholecystostomy tube to external drainage. Neither catheter requires routine flushing. - The patient should return for routine fluoroscopic guided exchange of both drainage catheters and approximately 8 weeks. Note, consideration for internal biliary stent placement will be determined following next post treatment staging CT. Electronically Signed   By: Sandi Mariscal M.D.   On: 10/30/2020 16:36    Labs:  CBC: Recent Labs    10/27/20 1048 10/31/20 2032 11/01/20 0329 11/02/20 0350  WBC 4.2 7.4 8.1 5.6  HGB 10.4* 10.2* 10.7* 9.1*  HCT 31.7* 30.5* 32.6* 27.7*  PLT 159 155 146* 122*    COAGS: Recent Labs    01/06/20 0915 09/09/20 1900 09/21/20 1126 09/22/20 0414 09/24/20 0620 10/31/20 2032  INR 1.0   < > 1.6* 1.8* 1.5* 1.1  APTT 29  --  35  --   --  33   < > = values in this interval not displayed.    BMP: Recent Labs    11/20/19 0900 12/11/19 1000 12/31/19 1056 02/18/20 1025 10/27/20 1048 10/31/20 2032 11/01/20 0329 11/02/20 0350  NA 139 141 140   < > 133* 129* 129* 130*  K 4.0 4.0 4.0   < > 4.2 3.8 3.7 3.5  CL 104 107 106   < > 100 99 98 100  CO2 '30 29 28    '$ < > '27 23 24 24  '$ GLUCOSE 103* 105* 127*   < > 120* 124* 126* 104*  BUN '18 13 16   '$ < > '10 13 10 14  '$ CALCIUM 9.8 9.4 9.4   < > 9.2 8.1* 8.2* 7.8*  CREATININE 0.90 0.83 0.87   < > 0.63 0.83 0.78 0.70  GFRNONAA >60 >60 >60   < > >60 >60 >60 >60  GFRAA >60 >60 >60  --   --   --   --   --    < > =  values in this interval not displayed.    LIVER FUNCTION TESTS: Recent Labs    10/27/20 1048 10/31/20 2032 11/01/20 0329 11/02/20 0350  BILITOT 0.8 1.0 1.5* 1.4*  AST 37 44* 47* 32  ALT 27 35 36 27  ALKPHOS 257* 218* 212* 167*  PROT 6.7 6.7 6.6 6.1*  ALBUMIN 3.0* 2.6* 2.5* 2.2*    Assessment and Plan: Patient with history of gastric cancer with prior gastrectomy, obstructive jaundice, prior internal and external biliary drain placement as well as gallbladder drain placement in June of this year.  Failed ERCP stenting.  Status post exchange of biliary drain and gallbladder drain on 7/29 as outpatient with capping of biliary drain ;patient developed fever and chills and returned to ED on 7/31; currently afebrile, WBC normal, hemoglobin 9.1 down from 10.7, creatinine normal, K nl, total bilirubin 1.4, previous bile cultures grew Enterobacter; latest CT abdomen pelvis performed yesterday revealed persistent intrahepatic biliary ductal dilatation and appropriate positioning of both biliary drain and gallbladder drain.  Case and imaging studies were reviewed by Dr. Serafina Royals.  Recommend keeping both gallbladder drain and biliary drain to gravity bag while patient being treated for cholangitis; monitor output and labs closely; encourage adequate hydration/nutrition, will schedule for f/u cholangiogram in 2 weeks   Electronically Signed: D. Rowe Robert, PA-C 11/02/2020, 10:59 AM   I spent a total of 15 Minutes at the the patient's bedside AND on the patient's hospital floor or unit, greater than 50% of which was counseling/coordinating care for gallbladder and biliary drains    [name], male   DOB: 1940-04-17, 80 y.o.   MRN: [id number]

## 2020-11-02 NOTE — Plan of Care (Signed)
  Problem: Health Behavior/Discharge Planning: Goal: Ability to manage health-related needs will improve Outcome: Progressing   Problem: Nutrition: Goal: Adequate nutrition will be maintained Outcome: Progressing   Problem: Coping: Goal: Level of anxiety will decrease Outcome: Progressing   Problem: Elimination: Goal: Will not experience complications related to bowel motility Outcome: Progressing   Problem: Skin Integrity: Goal: Risk for impaired skin integrity will decrease Outcome: Progressing

## 2020-11-02 NOTE — Progress Notes (Signed)
PROGRESS NOTE    Jonathan Lewis  [id number] DOB: 03-12-1941 DOA: 10/31/2020 PCP: Biagio Borg, MD     Brief Narrative:  Jonathan Lewis is a 80 y.o. male with medical history significant for diet-controlled diabetes mellitus, hypertension, acoustic neuroma, gastric cancer status post distal gastrectomy, and biliary obstruction in June with cholecystitis and Enterobacter aerogenes bacteremia, now presenting to the emergency department with fever and chills.  Patient had percutaneous biliary drain and cholecystostomy tubes placed in June.  He had been experiencing increased abdominal pain and nausea few days ago and the tubes were exchanged on 10/30/2020.  He developed subjective fever and chills the day of the tube exchange but has not had any more abdominal pain or vomiting.  He describes shaking chills.  New events last 24 hours / Subjective: States that he is feeling better.  Fever as high as 100.4 overnight.  Denies any abdominal pain, no nausea or vomiting.  Ate lunch without any issues.  Assessment & Plan:   Principal Problem:   SIRS (systemic inflammatory response syndrome) (HCC) Active Problems:   Diet-controlled diabetes mellitus ([place])   Gastric cancer pT3pN2 s/p distal gastrectomy with Billroth II gastrojejunostomy 03/19/2019   Normocytic anemia   Chronic combined systolic and diastolic CHF (congestive heart failure) (HCC)   Hyponatremia   Sepsis with concern for asending cholangitis -History of biliary obstruction, cholecystitis, history of Enterobacter bacteremia in June.  Status post percutaneous biliary drain, cholecystostomy tube which were exchanged 7/29 -Presented with fever 103.1, tachycardia 109 -CT A/P: Persistent, possibly slightly worsened, intra-hepatic biliary ductal dilatation in the setting of both a percutaneous transhepatic biliary drainage catheter and cholecystostomy tube in appropriate position. Correlate clinically for possible cholangitis. -Blood  cultures negative to date -Continue cefepime, Flagyl -Discussed with GI over secure chat, IR is consulted and following   Chronic systolic and diastolic heart failure -Received IV fluids for concern for sepsis -Monitor  Hyponatremia -Improved and stable  Gastric adenocarcinoma  -Status post distal gastrectomy 2020 -Followed by Dr. Marin Olp   DVT prophylaxis:  enoxaparin (LOVENOX) injection 40 mg Start: 11/01/20 1000  Code Status:     Code Status Orders  (From admission, onward)           Start     Ordered   11/01/20 0326  Do not attempt resuscitation (DNR)  Continuous       Question Answer Comment  In the event of cardiac or respiratory ARREST Do not call a "code blue"   In the event of cardiac or respiratory ARREST Do not perform Intubation, CPR, defibrillation or ACLS   In the event of cardiac or respiratory ARREST Use medication by any route, position, wound care, and other measures to relive pain and suffering. May use oxygen, suction and manual treatment of airway obstruction as needed for comfort.      11/01/20 0328           Code Status History     Date Active Date Inactive Code Status Order ID Comments User Context   09/21/2020 1552 09/30/2020 2017 DNR [id number]  Gwynne Edinger, MD ED   09/09/2020 1815 09/12/2020 1941 Full Code [id number]  Jonnie Finner, DO Inpatient   01/08/2020 1339 01/09/2020 1921 Full Code [id number]  Justice Britain, PA-C Inpatient   03/07/2019 1258 04/04/2019 1733 Full Code [id number]  Georgette Shell, MD ED   05/05/2015 1624 05/06/2015 2043 Full Code [id number]  Waldemar Dickens, MD ED      Advance Directive  Documentation    Flowsheet Row Most Recent Value  Type of Advance Directive Healthcare Power of Attorney, Living will  Pre-existing out of facility DNR order (yellow form or pink MOST form) --  "MOST" Form in Place? --      Family Communication: At bedside Disposition Plan:  Status is: Inpatient  Remains inpatient  appropriate because:IV treatments appropriate due to intensity of illness or inability to take PO  Dispo: The patient is from: Home              Anticipated d/c is to: Home              Patient currently is not medically stable to d/c.   Difficult to place patient No      Consultants:  IR  Procedures:  None   Antimicrobials:  Anti-infectives (From admission, onward)    Start     Dose/Rate Route Frequency Ordered Stop   11/01/20 0600  metroNIDAZOLE (FLAGYL) IVPB 500 mg        500 mg 100 mL/hr over 60 Minutes Intravenous Every 8 hours 11/01/20 0328     11/01/20 0600  ceFEPIme (MAXIPIME) 2 g in sodium chloride 0.9 % 100 mL IVPB        2 g 200 mL/hr over 30 Minutes Intravenous Every 8 hours 11/01/20 0355     10/31/20 2115  ceFEPIme (MAXIPIME) 2 g in sodium chloride 0.9 % 100 mL IVPB        2 g 200 mL/hr over 30 Minutes Intravenous  Once 10/31/20 2104 10/31/20 2203   10/31/20 2115  metroNIDAZOLE (FLAGYL) IVPB 500 mg        500 mg 100 mL/hr over 60 Minutes Intravenous  Once 10/31/20 2104 10/31/20 2308        Objective: Vitals:   11/02/20 0023 11/02/20 0437 11/02/20 0854 11/02/20 1251  BP: 113/72 93/75 120/76 94/65  Pulse: 77 88 76 79  Resp: '18  16 18  '$ Temp: (!) 100.4 F (38 C) 98.2 F (36.8 C) 98.9 F (37.2 C) 99.3 F (37.4 C)  TempSrc: Oral Oral Oral Oral  SpO2: 100% 100% 100% 100%  Weight:      Height:        Intake/Output Summary (Last 24 hours) at 11/02/2020 1343 Last data filed at 11/02/2020 0636 Gross per 24 hour  Intake 499.23 ml  Output 1200 ml  Net -700.77 ml   Filed Weights   10/31/20 2023  Weight: 72.6 kg    Examination: General exam: Appears calm and comfortable  Respiratory system: Clear to auscultation. Respiratory effort normal. No respiratory distress. No conversational dyspnea.  Cardiovascular system: S1 & S2 heard, RRR. No murmurs. No pedal edema. Gastrointestinal system: Abdomen is nondistended, soft and nontender. Normal bowel sounds  heard. +drains in place  Central nervous system: Alert and oriented. No focal neurological deficits. Speech clear.  Extremities: Symmetric in appearance  Skin: No rashes, lesions or ulcers on exposed skin  Psychiatry: Judgement and insight appear normal. Mood & affect appropriate.   Data Reviewed: I have personally reviewed following labs and imaging studies  CBC: Recent Labs  Lab 10/27/20 1048 10/31/20 2032 11/01/20 0329 11/02/20 0350  WBC 4.2 7.4 8.1 5.6  NEUTROABS 2.2 4.0  --   --   HGB 10.4* 10.2* 10.7* 9.1*  HCT 31.7* 30.5* 32.6* 27.7*  MCV 86.4 85.2 86.2 87.7  PLT 159 155 146* [id number]*   Basic Metabolic Panel: Recent Labs  Lab 10/27/20 1048 10/31/20 2032 11/01/20 0329 11/02/20  0350  NA 133* 129* 129* 130*  K 4.2 3.8 3.7 3.5  CL 100 99 98 100  CO2 '27 23 24 24  '$ GLUCOSE 120* 124* 126* 104*  BUN '10 13 10 14  '$ CREATININE 0.63 0.83 0.78 0.70  CALCIUM 9.2 8.1* 8.2* 7.8*   GFR: Estimated Creatinine Clearance: 70.1 mL/min (by C-G formula based on SCr of 0.7 mg/dL). Liver Function Tests: Recent Labs  Lab 10/27/20 1048 10/31/20 2032 11/01/20 0329 11/02/20 0350  AST 37 44* 47* 32  ALT 27 35 36 27  ALKPHOS 257* 218* 212* 167*  BILITOT 0.8 1.0 1.5* 1.4*  PROT 6.7 6.7 6.6 6.1*  ALBUMIN 3.0* 2.6* 2.5* 2.2*   No results for input(s): LIPASE, AMYLASE in the last 168 hours. No results for input(s): AMMONIA in the last 168 hours. Coagulation Profile: Recent Labs  Lab 10/31/20 2032  INR 1.1   Cardiac Enzymes: No results for input(s): CKTOTAL, CKMB, CKMBINDEX, TROPONINI in the last 168 hours. BNP (last 3 results) No results for input(s): PROBNP in the last 8760 hours. HbA1C: No results for input(s): HGBA1C in the last 72 hours. CBG: No results for input(s): GLUCAP in the last 168 hours. Lipid Profile: No results for input(s): CHOL, HDL, LDLCALC, TRIG, CHOLHDL, LDLDIRECT in the last 72 hours. Thyroid Function Tests: No results for input(s): TSH, T4TOTAL, FREET4,  T3FREE, THYROIDAB in the last 72 hours. Anemia Panel: No results for input(s): VITAMINB12, FOLATE, FERRITIN, TIBC, IRON, RETICCTPCT in the last 72 hours. Sepsis Labs: Recent Labs  Lab 10/31/20 2032 10/31/20 2232 11/01/20 0515 11/02/20 0350  PROCALCITON  --   --  0.31 0.52  LATICACIDVEN 1.8 2.3* 2.1*  --     Recent Results (from the past 240 hour(s))  Culture, blood (Routine x 2)     Status: None (Preliminary result)   Collection Time: 10/31/20  8:56 PM   Specimen: BLOOD  Result Value Ref Range Status   Specimen Description   Final    BLOOD PORTA CATH Performed at [facility] [address], [place], [place] [address]    Special Requests   Final    BOTTLES DRAWN AEROBIC AND ANAEROBIC Blood Culture adequate volume Performed at [facility] [address], [place], [place] [address]    Culture   Final    NO GROWTH 1 DAY Performed at [facility], [place] [address], [place],  [address]    Report Status PENDING  Incomplete  Resp Panel by RT-PCR (Flu A&B, Covid) Nasopharyngeal Swab     Status: None   Collection Time: 10/31/20  9:04 PM   Specimen: Nasopharyngeal Swab; Nasopharyngeal(NP) swabs in vial transport medium  Result Value Ref Range Status   SARS Coronavirus 2 by RT PCR NEGATIVE NEGATIVE Final    Comment: (NOTE) SARS-CoV-2 target nucleic acids are NOT DETECTED.  The SARS-CoV-2 RNA is generally detectable in upper respiratory specimens during the acute phase of infection. The lowest concentration of SARS-CoV-2 viral copies this assay can detect is 138 copies/mL. A negative result does not preclude SARS-Cov-2 infection and should not be used as the sole basis for treatment or other patient management decisions. A negative result may occur with  improper specimen collection/handling, submission of specimen other than nasopharyngeal swab, presence of viral mutation(s) within the areas targeted by this assay, and  inadequate number of viral copies(<138 copies/mL). A negative result must be combined with clinical observations, patient history, and epidemiological information. The expected result is Negative.  Fact  Sheet for Patients:  EntrepreneurPulse.com.au  Fact Sheet for Healthcare Providers:  IncredibleEmployment.be  This test is no t yet approved or cleared by the [place] FDA and  has been authorized for detection and/or diagnosis of SARS-CoV-2 by FDA under an Emergency Use Authorization (EUA). This EUA will remain  in effect (meaning this test can be used) for the duration of the COVID-19 declaration under Section 564(b)(1) of the Act, 21 U.S.C.section 360bbb-3(b)(1), unless the authorization is terminated  or revoked sooner.       Influenza A by PCR NEGATIVE NEGATIVE Final   Influenza B by PCR NEGATIVE NEGATIVE Final    Comment: (NOTE) The Xpert Xpress SARS-CoV-2/FLU/RSV plus assay is intended as an aid in the diagnosis of influenza from Nasopharyngeal swab specimens and should not be used as a sole basis for treatment. Nasal washings and aspirates are unacceptable for Xpert Xpress SARS-CoV-2/FLU/RSV testing.  Fact Sheet for Patients: EntrepreneurPulse.com.au  Fact Sheet for Healthcare Providers: IncredibleEmployment.be  This test is not yet approved or cleared by the [place] FDA and has been authorized for detection and/or diagnosis of SARS-CoV-2 by FDA under an Emergency Use Authorization (EUA). This EUA will remain in effect (meaning this test can be used) for the duration of the COVID-19 declaration under Section 564(b)(1) of the Act, 21 U.S.C. section 360bbb-3(b)(1), unless the authorization is terminated or revoked.  Performed at [facility], [place] [address], [place], [place] [address]   Culture, blood (Routine x 2)     Status: None (Preliminary result)   Collection  Time: 10/31/20  9:14 PM   Specimen: BLOOD  Result Value Ref Range Status   Specimen Description   Final    BLOOD BLOOD LEFT FOREARM Performed at [facility] [address], [place], [place] [address]    Special Requests   Final    BOTTLES DRAWN AEROBIC AND ANAEROBIC Blood Culture results may not be optimal due to an inadequate volume of blood received in culture bottles Performed at [facility] [address] [place], [place] [address]    Culture   Final    NO GROWTH 1 DAY Performed at [facility], [place] [address], [place], [place] [address]    Report Status PENDING  Incomplete  Urine Culture     Status: None   Collection Time: 10/31/20 10:00 PM   Specimen: In/Out Cath Urine  Result Value Ref Range Status   Specimen Description   Final    IN/OUT CATH URINE Performed at [facility] [address], [place], [place] [address]    Special Requests   Final    NONE Performed at [facility], [place] [address], [place], [place] [address]    Culture   Final    NO GROWTH Performed at [facility], [place] [address] [place],  [address]    Report Status 11/02/2020 FINAL  Final      Radiology Studies: DG Chest 2 View  Result Date: 10/31/2020 CLINICAL DATA:  Sepsis EXAM: CHEST - 2 VIEW COMPARISON:  09/21/2020 FINDINGS: Lungs are well expanded, symmetric, and clear. No pneumothorax or pleural effusion. Cardiac size within normal limits. Pulmonary vascularity is normal. Right internal jugular chest port tip is seen at the superior cavoatrial junction, unchanged osseous structures are age-appropriate. No acute bone abnormality. IMPRESSION: No active cardiopulmonary disease. Electronically Signed   By: Fidela Salisbury MD   On: 10/31/2020 21:53   CT ABDOMEN PELVIS  W CONTRAST  Result Date: 11/01/2020 CLINICAL DATA:  Abdominal pain, fever. complaining of fever and chills that started 7/29. Patient  has stomach cancer EXAM: CT ABDOMEN AND PELVIS WITH CONTRAST TECHNIQUE: Multidetector CT imaging of the abdomen and pelvis was performed using the standard protocol following bolus administration of intravenous contrast. CONTRAST:  86m OMNIPAQUE IOHEXOL 350 MG/ML SOLN COMPARISON:  CT abdomen pelvis 09/21/2020 FINDINGS: Lower chest: Calcified micronodule within the right middle lobe. Subsegmental bilateral lower lobe atelectasis. Central venous catheter terminates at the superior cavoatrial junction. Hepatobiliary: Redemonstration of less conspicuous 1.4 cm hepatic lesion (2:28). Interval resolution of a hydropic gallbladder. Interval placement of a cholecystostomy tube terminating within the gallbladder lumen. No CT evidence of definite gallstones, gallbladder wall thickening, or pericholecystic fluid. Redemonstration of a transhepatic common bile duct stent with pigtail terminating within the second portion of the duodenum just distal to the ampullary. Persistent, possibly slightly worsened, intrahepatic biliary ductal dilatation. No definite biliary duct wall thickening. Pancreas: No focal lesion. Normal pancreatic contour. No surrounding inflammatory changes. No main pancreatic ductal dilatation. Spleen: Normal in size without focal abnormality. Adrenals/Urinary Tract: No adrenal nodule bilaterally. Bilateral kidneys enhance symmetrically. Couple calcified stones within the right kidney measuring up to 3 mm. No left nephrolithiasis. No hydronephrosis. No hydroureter. No ureterolithiasis bilaterally. The urinary bladder is unremarkable. Stomach/Bowel: Surgical changes related to partial gastric resection again noted. Stomach is within normal limits. No evidence of bowel wall thickening or dilatation. Appendix appears normal. Vascular/Lymphatic: No abdominal aorta or iliac aneurysm. Mild atherosclerotic plaque of the aorta and its branches. No abdominal, pelvic, or inguinal lymphadenopathy. Reproductive: Prostate  is unremarkable. Other: Right upper quadrant peritoneal soft tissue densities measuring 0.8 10, 0.7, 0.8 cm. More finding within the left upper abdomen measuring 1.2 cm. Other regions of peritoneal carcinomatosis also noted (2:38). Trace perihepatic free fluid (5:44). No intraperitoneal free gas. No organized fluid collection. Musculoskeletal: No abdominal wall hernia or abnormality. No suspicious lytic or blastic osseous lesions. No acute displaced fracture. Multilevel degenerative changes of the spine. IMPRESSION: 1. Gastric cancer with peritoneal carcinomatosis and subcapsular implant along the left hepatic lobe. 2. Persistent, possibly slightly worsened, intra-hepatic biliary ductal dilatation in the setting of both a percutaneous transhepatic biliary drainage catheter and cholecystostomy tube in appropriate position. Correlate clinically for possible cholangitis. Electronically Signed   By: MIven FinnM.D.   On: 11/01/2020 01:59      Scheduled Meds:  Chlorhexidine Gluconate Cloth  6 each Topical Daily   dorzolamide-timolol  1 drop Both Eyes BID   enoxaparin (LOVENOX) injection  40 mg Subcutaneous Q24H   feeding supplement  237 mL Oral TID BM   latanoprost  1 drop Both Eyes QHS   multivitamin with minerals  1 tablet Oral Daily   sodium chloride flush  10-40 mL Intracatheter Q12H   Continuous Infusions:  ceFEPime (MAXIPIME) IV 2 g (11/02/20 0538)   metronidazole 500 mg (11/02/20 [id number]     LOS: 1 day      Time spent: 25 minutes   JDessa Phi DO [facility] 11/02/2020, 1:43 PM   Available via Epic secure chat 7am-7pm After these hours, please refer to coverage provider listed on [email or web address]

## 2020-11-02 NOTE — Plan of Care (Signed)
  Problem: Education: Goal: Knowledge of General Education information will improve Description: Including pain rating scale, medication(s)/side effects and non-pharmacologic comfort measures Outcome: Progressing   Problem: Health Behavior/Discharge Planning: Goal: Ability to manage health-related needs will improve Outcome: Progressing   Problem: Clinical Measurements: Goal: Will remain free from infection Outcome: Progressing   Problem: Nutrition: Goal: Adequate nutrition will be maintained Outcome: Progressing   Problem: Coping: Goal: Level of anxiety will decrease Outcome: Progressing   Problem: Elimination: Goal: Will not experience complications related to bowel motility Outcome: Progressing Goal: Will not experience complications related to urinary retention Outcome: Progressing   Problem: Pain Managment: Goal: General experience of comfort will improve Outcome: Progressing   Problem: Skin Integrity: Goal: Risk for impaired skin integrity will decrease Outcome: Progressing

## 2020-11-02 NOTE — Progress Notes (Signed)
Initial Nutrition Assessment  DOCUMENTATION CODES:   Not applicable  INTERVENTION:   -Increase Ensure Enlive po to TID, each supplement provides 350 kcal and 20 grams of protein  -Magic cup TID with meals, each supplement provides 290 kcal and 9 grams of protein  -MVI with minerals daily  NUTRITION DIAGNOSIS:   Increased nutrient needs related to cancer and cancer related treatments as evidenced by estimated needs.  GOAL:   Patient will meet greater than or equal to 90% of their needs  MONITOR:   PO intake, Supplement acceptance, Labs, Weight trends, Skin, I & O's  REASON FOR ASSESSMENT:   Malnutrition Screening Tool    ASSESSMENT:   Jonathan Lewis is a 80 y.o. male with medical history significant for diet-controlled diabetes mellitus, hypertension, acoustic neuroma, gastric cancer status post distal gastrectomy, and biliary obstruction in June with cholecystitis and Enterobacter aerogenes bacteremia, now presenting to the emergency department with fever and chills.  Patient had percutaneous biliary drain and cholecystostomy tubes placed in June.  He had been experiencing increased abdominal pain and nausea few days ago and the tubes were exchanged on 10/30/2020.  He developed subjective fever and chills the day of the tube exchange but has not had any more abdominal pain or vomiting.  He describes shaking chills.  He denies cough or shortness of breath, denies dysuria or flank pain, and notes a slight tinge of blood from his drains but no other change in the fluid.  Pt admitted with SIRS.   7/29- s/p biliary drain cholecystectomy tube exchange  Reviewed I/O's: -506 ml x 24 hours and +1.8 L since admission  UOP: 750 ml x 24 hours  Drain output: 450 ml x 24 hours  Pt unavailable at time of visit. Attempted to speak with pt via call to hospital room phone, however, unable to reach. RD unable to obtain further nutrition-related history or complete nutrition-focused physical  exam at this time.    No meal completions data available to assess at this time of visit.   Reviewed wt hx; pt has experienced a 13.9% wt loss over the past 2 months, which is significant for time frame. Pt is at high risk for malnutrition, however, RD unable to identify at this time.   Pt with increased nutritional needs and would benefit from nutrient dense supplement. One Ensure Enlive supplement provides 350 kcals, 20 grams protein, and 44-45 grams of carbohydrate vs one Glucerna shake supplement, which provides 220 kcals, 10 grams of protein, and 26 grams of carbohydrate. Given pt's hx of DM, RD will reassess adequacy of PO intake, CBGS, and adjust supplement regimen as appropriate at follow-up.    Lab Results  Component Value Date   HGBA1C 6.1 (H) 09/10/2020   PTA DM medications are none.   Labs reviewed: Na: 130 (inpatient orders for glycemic control are none).    Diet Order:   Diet Order             Diet Carb Modified Fluid consistency: Thin; Room service appropriate? Yes  Diet effective now                   EDUCATION NEEDS:   No education needs have been identified at this time  Skin:  Skin Assessment: Reviewed RN Assessment  Last BM:  Unknown  Height:   Ht Readings from Last 1 Encounters:  10/31/20 '5\' 8"'$  (1.727 m)    Weight:   Wt Readings from Last 1 Encounters:  10/31/20 72.6 kg  Ideal Body Weight:  70 kg  BMI:  Body mass index is 24.33 kg/m.  Estimated Nutritional Needs:   Kcal:  2100-2300  Protein:  105-120 grams  Fluid:  > 2 L    Loistine Chance, RD, LDN, [place] Registered Dietitian II Certified Diabetes Care and Education Specialist Please refer to [facility] for RD and/or RD on-call/weekend/after hours pager

## 2020-11-03 ENCOUNTER — Other Ambulatory Visit: Payer: Self-pay | Admitting: Radiology

## 2020-11-03 ENCOUNTER — Other Ambulatory Visit: Payer: Self-pay | Admitting: Hematology & Oncology

## 2020-11-03 DIAGNOSIS — C169 Malignant neoplasm of stomach, unspecified: Secondary | ICD-10-CM

## 2020-11-03 DIAGNOSIS — R509 Fever, unspecified: Secondary | ICD-10-CM

## 2020-11-03 DIAGNOSIS — A419 Sepsis, unspecified organism: Principal | ICD-10-CM

## 2020-11-03 LAB — BASIC METABOLIC PANEL
Anion gap: 6 (ref 5–15)
BUN: 14 mg/dL (ref 8–23)
CO2: 23 mmol/L (ref 22–32)
Calcium: 7.7 mg/dL — ABNORMAL LOW (ref 8.9–10.3)
Chloride: 101 mmol/L (ref 98–111)
Creatinine, Ser: 0.71 mg/dL (ref 0.61–1.24)
GFR, Estimated: >60 mL/min (ref >60)
Glucose, Bld: 103 mg/dL — ABNORMAL HIGH (ref 70–99)
Potassium: 3.6 mmol/L (ref 3.5–5.1)
Sodium: 130 mmol/L — ABNORMAL LOW (ref 135–145)

## 2020-11-03 LAB — CBC
HCT: 28.8 % — ABNORMAL LOW (ref 39.0–52.0)
Hemoglobin: 9.5 g/dL — ABNORMAL LOW (ref 13.0–17.0)
MCH: 28.7 pg (ref 26.0–34.0)
MCHC: 33 g/dL (ref 30.0–36.0)
MCV: 87 fL (ref 80.0–100.0)
Platelets: 170 10*3/uL (ref 150–400)
RBC: 3.31 MIL/uL — ABNORMAL LOW (ref 4.22–5.81)
RDW: 14.6 % (ref 11.5–15.5)
WBC: 5.5 10*3/uL (ref 4.0–10.5)
nRBC: 0 % (ref 0.0–0.2)

## 2020-11-03 MED ORDER — HEPARIN SOD (PORK) LOCK FLUSH 100 UNIT/ML IV SOLN
500.0000 [IU] | INTRAVENOUS | Status: AC | PRN
  Administered 2020-11-03: 500 [IU]
  Filled 2020-11-03: qty 5

## 2020-11-03 MED ORDER — METRONIDAZOLE 500 MG PO TABS: 500.0000 mg | ORAL_TABLET | Freq: Three times a day (TID) | ORAL | 0 refills | Status: AC

## 2020-11-03 MED ORDER — CIPROFLOXACIN HCL 500 MG PO TABS: 500.0000 mg | ORAL_TABLET | Freq: Two times a day (BID) | ORAL | 0 refills | Status: AC

## 2020-11-03 NOTE — Consult Note (Signed)
Referral MD  Reason for Referral: Metastatic gastric cancer; fever possibly secondary to cholangitis.  Chief Complaint  Patient presents with   Cancer   Fever  : Had a temperature at home.  HPI: Jonathan Lewis is a very nice 80 year old African-American male.  He is a Theme park manager at [employer]..  He has metastatic gastric cancer.  He has history of biliary obstruction that required a biliary catheter.  He actually has had marked improvement of this.  He was seen in the office last week.  We are going to start him on systemic chemotherapy.  However, a couple days later, his wife said that he had a temperature of 103.  He subsequently was sent to the hospital.  Cultures were all taken.  So far cultures are all negative.  He has been placed on IV antibiotics.  He has had I think the biliary catheter tubes exchanged.  We did have a CT of the abdomen and pelvis on 11/01/2020.  This showed some persistent with possibly slight worsening intrahepatic biliary ductal dilatation.  He had lab work done today.  His sodium is 130.  Potassium 3.6.  BUN 14 creatinine 0.71.  His white cell count is 5.5.  Hemoglobin 9.5.  Hematocrit 28.8.  Platelet count 178,000.  He actually looks pretty good.  He is not having any abdominal pain.  There is no bleeding.  He is having no diarrhea.  There is no cough.  Again, I have to suspect that this fever is probably from the biliary system.  He is on Maxipime and Flagyl.  There is no rashes.  He has had no leg swelling.  Overall, his performance status is ECOG 2.  Past Medical History:  Diagnosis Date   Acoustic neuroma ([place]) 06/25/2015   Allergic rhinitis 07/18/2014   Allergy    Anemia    Arthritis    BPH (benign prostatic hyperplasia)    Cancer ([place])    stage III stomach-Dx 03/2019   Carbuncle    recurrent MRSA carbuncles   Cataract    Per pt bilateral cataracts removed.   Diabetes mellitus    Type II. Per pt Dr. Jenny Reichmann took him off his dm med.   Disk prolapse     Glaucoma    Hyperlipidemia    Hypertension    Male hypogonadism 07/16/2014   Neuropathy    Pneumonia    Sinus bradycardia    chronic, asymptomatic   Sinusitis 07/29/2012  :   Past Surgical History:  Procedure Laterality Date   ANTERIOR CERVICAL DECOMPRESSION/DISCECTOMY FUSION 4 LEVELS N/A 01/08/2020   Procedure: ANTERIOR CERVICAL DECOMPRESSION FUSION CERVICAL 3-4, CERVICAL 4-5, CERVIAL 5-6 WITH INSTRUMENTATION AND ALLOGRAFT;  Surgeon: Phylliss Bob, MD;  Location: [facility];  Service: Orthopedics;  Laterality: N/A;   BALLOON DILATION N/A 03/12/2019   Procedure: BALLOON DILATION;  Surgeon: Rush Landmark Telford Nab., MD;  Location: [facility];  Service: Gastroenterology;  Laterality: N/A;  pyloric   BIOPSY  03/08/2019   Procedure: BIOPSY;  Surgeon: Yetta Flock, MD;  Location: [facility];  Service: Gastroenterology;;   BIOPSY  03/12/2019   Procedure: BIOPSY;  Surgeon: Irving Copas., MD;  Location: [facility];  Service: Gastroenterology;;   CATARACT EXTRACTION     x 2   COLONOSCOPY     ENDOSCOPIC RETROGRADE CHOLANGIOPANCREATOGRAPHY (ERCP) WITH PROPOFOL N/A 09/09/2020   Procedure: ENDOSCOPIC RETROGRADE CHOLANGIOPANCREATOGRAPHY (ERCP) WITH PROPOFOL;  Surgeon: Irving Copas., MD;  Location: [facility];  Service: Gastroenterology;  Laterality: N/A;   ESOPHAGOGASTRODUODENOSCOPY N/A  03/08/2019   Procedure: ESOPHAGOGASTRODUODENOSCOPY (EGD);  Surgeon: Yetta Flock, MD;  Location: [facility];  Service: Gastroenterology;  Laterality: N/A;   ESOPHAGOGASTRODUODENOSCOPY (EGD) WITH PROPOFOL N/A 03/12/2019   Procedure: ESOPHAGOGASTRODUODENOSCOPY (EGD) WITH PROPOFOL;  Surgeon: Rush Landmark Telford Nab., MD;  Location: [facility];  Service: Gastroenterology;  Laterality: N/A;   FINE NEEDLE ASPIRATION  03/12/2019   Procedure: FINE NEEDLE ASPIRATION (FNA) LINEAR;  Surgeon: Irving Copas., MD;  Location: [facility];  Service: Gastroenterology;;   FOREIGN BODY REMOVAL   03/08/2019   Procedure: FOREIGN BODY REMOVAL;  Surgeon: Yetta Flock, MD;  Location: [facility];  Service: Gastroenterology;;   IR BILIARY DRAIN PLACEMENT WITH CHOLANGIOGRAM  09/09/2020   IR CHOLANGIOGRAM EXISTING TUBE  09/29/2020   IR CHOLANGIOGRAM EXISTING TUBE  09/29/2020   IR EXCHANGE BILIARY DRAIN  09/24/2020   IR EXCHANGE BILIARY DRAIN  10/30/2020   IR EXCHANGE BILIARY DRAIN  10/30/2020   IR INT EXT BILIARY DRAIN WITH CHOLANGIOGRAM  09/10/2020   IR PATIENT EVAL TECH 0-60 MINS  04/03/2019   IR PERC CHOLECYSTOSTOMY  09/24/2020   LAPAROTOMY N/A 03/19/2019   Procedure: EXPLORATORY LAPAROTOMY, distal GASTRECTOMY AND PLACEMENT OF G AND J TUBE, gastric jejunostomy;  Surgeon: Greer Pickerel, MD;  Location: [facility];  Service: General;  Laterality: N/A;   POLYPECTOMY     PORTACATH PLACEMENT N/A 03/26/2019   Procedure: INSERTION PORT-A-CATH;  Surgeon: Michael Boston, MD;  Location: [facility];  Service: General;  Laterality: N/A;   [place] INJECTION  09/09/2020   Procedure: SUBMUCOSAL TATTOO INJECTION;  Surgeon: Irving Copas., MD;  Location: [facility];  Service: Gastroenterology;;   UPPER ESOPHAGEAL ENDOSCOPIC ULTRASOUND (EUS) N/A 03/12/2019   Procedure: UPPER ESOPHAGEAL ENDOSCOPIC ULTRASOUND (EUS);  Surgeon: Irving Copas., MD;  Location: [facility];  Service: Gastroenterology;  Laterality: N/A;   UPPER GASTROINTESTINAL ENDOSCOPY     VIDEO BRONCHOSCOPY WITH ENDOBRONCHIAL ULTRASOUND N/A 05/22/2019   Procedure: VIDEO BRONCHOSCOPY WITH ENDOBRONCHIAL ULTRASOUND;  Surgeon: Garner Nash, DO;  Location: [facility];  Service: Thoracic;  Laterality: N/A;  :   Current Facility-Administered Medications:    acetaminophen (TYLENOL) tablet 650 mg, 650 mg, Oral, Q6H PRN **OR** acetaminophen (TYLENOL) suppository 650 mg, 650 mg, Rectal, Q6H PRN, Opyd, Ilene Qua, MD   ceFEPIme (MAXIPIME) 2 g in sodium chloride 0.9 % 100 mL IVPB, 2 g, Intravenous, Q8H, Opyd, Timothy S,  MD, Last Rate: 200 mL/hr at 11/03/20 0508, 2 g at 11/03/20 [id number]   Chlorhexidine Gluconate Cloth 2 % PADS 6 each, 6 each, Topical, Daily, Dessa Phi, DO, 6 each at 11/02/20 1027   dorzolamide-timolol (COSOPT) 22.3-6.8 MG/ML ophthalmic solution 1 drop, 1 drop, Both Eyes, BID, Opyd, Timothy S, MD, 1 drop at 11/02/20 2110   enoxaparin (LOVENOX) injection 40 mg, 40 mg, Subcutaneous, Q24H, Opyd, Ilene Qua, MD, 40 mg at 11/02/20 1026   famotidine (PEPCID) tablet 20 mg, 20 mg, Oral, PRN, Opyd, Ilene Qua, MD   feeding supplement (ENSURE ENLIVE / ENSURE PLUS) liquid 237 mL, 237 mL, Oral, TID BM, Dessa Phi, DO, 237 mL at 11/02/20 2111   latanoprost (XALATAN) 0.005 % ophthalmic solution 1 drop, 1 drop, Both Eyes, QHS, Opyd, Timothy S, MD, 1 drop at 11/02/20 2111   meclizine (ANTIVERT) tablet 12.5 mg, 12.5 mg, Oral, Daily PRN, Opyd, Ilene Qua, MD   metroNIDAZOLE (FLAGYL) IVPB 500 mg, 500 mg, Intravenous, Q8H, Opyd, Timothy S, MD, Last Rate: 100 mL/hr at 11/03/20 0625, 500 mg at 11/03/20  0625   multivitamin with minerals tablet 1 tablet, 1 tablet, Oral, Daily, Dessa Phi, DO, 1 tablet at 11/02/20 1028   ondansetron (ZOFRAN) tablet 4 mg, 4 mg, Oral, Q6H PRN **OR** ondansetron (ZOFRAN) injection 4 mg, 4 mg, Intravenous, Q6H PRN, Opyd, Ilene Qua, MD   polyvinyl alcohol (LIQUIFILM TEARS) 1.4 % ophthalmic solution 1 drop, 1 drop, Both Eyes, TID PRN, Opyd, Ilene Qua, MD   senna-docusate (Senokot-S) tablet 1 tablet, 1 tablet, Oral, Daily PRN, Opyd, Ilene Qua, MD   sodium chloride flush (NS) 0.9 % injection 10-40 mL, 10-40 mL, Intracatheter, Q12H, Dessa Phi, DO   sodium chloride flush (NS) 0.9 % injection 10-40 mL, 10-40 mL, Intracatheter, PRN, Dessa Phi, DO:   Chlorhexidine Gluconate Cloth  6 each Topical Daily   dorzolamide-timolol  1 drop Both Eyes BID   enoxaparin (LOVENOX) injection  40 mg Subcutaneous Q24H   feeding supplement  237 mL Oral TID BM   latanoprost  1 drop Both Eyes QHS    multivitamin with minerals  1 tablet Oral Daily   sodium chloride flush  10-40 mL Intracatheter Q12H  :  No Known Allergies:   Family History  Problem Relation Age of Onset   Cancer Mother    Hypertension Mother    Diabetes Sister    Cancer Brother        colon   Hypertension Brother    Hyperlipidemia Brother    Stroke Brother    Hypertension Sister    Colon cancer Neg Hx    Heart attack Neg Hx    Sudden death Neg Hx    Esophageal cancer Neg Hx    Pancreatic cancer Neg Hx    Stomach cancer Neg Hx    Rectal cancer Neg Hx   :   Social History   Socioeconomic History   Marital status: Married    Spouse name: Not on file   Number of children: 7   Years of education: 7   Highest education level: Not on file  Occupational History   Occupation: Mining engineer: RETIRED  Tobacco Use   Smoking status: Former    Packs/day: 1.00    Years: 24.00    Pack years: 24.00    Types: Cigarettes    Quit date: 10/03/1978    Years since quitting: 42.1   Smokeless tobacco: Never  Vaping Use   Vaping Use: Never used  Substance and Sexual Activity   Alcohol use: Not Currently   Drug use: No   Sexual activity: Yes    Partners: Female  Other Topics Concern   Not on file  Social History Narrative   9th grade.  Married '60. [name] 3 sons -'43 '64, '66; 4 dtrs -'60, '62, '61, '65; 15 grands, 25 great-grands.   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   ACD- discussed living will and HCPOA (July '14) provided packet.          Social Determinants of Health   Financial Resource Strain: Not on file  Food Insecurity: Not on file  Transportation Needs: Not on file  Physical Activity: Not on file  Stress: Not on file  Social Connections: Not on file  Intimate Partner Violence: Not on file  :  Pertinent items are noted in HPI.  Exam:  Physical Exam Vitals reviewed.  HENT:     Head: Normocephalic and atraumatic.  Eyes:     Pupils: Pupils are equal, round, and  reactive to light.  Cardiovascular:  Rate and Rhythm: Normal rate and regular rhythm.     Heart sounds: Normal heart sounds.  Pulmonary:     Effort: Pulmonary effort is normal.     Breath sounds: Normal breath sounds.  Abdominal:     General: Bowel sounds are normal.     Palpations: Abdomen is soft.     Comments: Abdominal exam is soft.  He has a biliary catheter is in place.  There is no distention.  He has no guarding or rebound tenderness.  There is no fluid wave.  There is no palpable liver or spleen tip.  Musculoskeletal:        General: No tenderness or deformity. Normal range of motion.     Cervical back: Normal range of motion.  Lymphadenopathy:     Cervical: No cervical adenopathy.  Skin:    General: Skin is warm and dry.     Findings: No erythema or rash.  Neurological:     Mental Status: He is alert and oriented to person, place, and time.  Psychiatric:        Behavior: Behavior normal.        Thought Content: Thought content normal.        Judgment: Judgment normal.    Patient Vitals for the past 24 hrs:  BP Temp Temp src Pulse Resp SpO2  11/03/20 0506 99/64 98.5 F (36.9 C) Oral 71 20 100 %  11/02/20 2041 112/79 98.5 F (36.9 C) Oral 82 20 (!) 87 %  11/02/20 1251 94/65 99.3 F (37.4 C) Oral 79 18 100 %  11/02/20 0854 120/76 98.9 F (37.2 C) Oral 76 16 100 %      Recent Labs    11/02/20 0350 11/03/20 0400  WBC 5.6 5.5  HGB 9.1* 9.5*  HCT 27.7* 28.8*  PLT 122* 170    Recent Labs    11/02/20 0350 11/03/20 0400  NA 130* 130*  K 3.5 3.6  CL 100 101  CO2 24 23  GLUCOSE 104* 103*  BUN 14 14  CREATININE 0.70 0.71  CALCIUM 7.8* 7.7*    Blood smear review:  None  Pathology:  none    Assessment and Plan: Jonathan Lewis is an 80 year old African-American male with metastatic gastric cancer.  He came in with fever.  Cultures were all negative.  I thought to believe that this was some type of cholangitis.  It sounds like he is going go home  today.  We will have to give him a couple weeks before we can think about any treatment for him.  I am glad that he is doing better.  He looks actually pretty good.  His labs do look all that bad.  His last bilirubin was 1.4.  We will let him know after he gets out of the hospital when to come back so we can try to get him on treatment.  I do appreciate the outstanding care that he received from everybody on [address].  Lattie Haw, MD

## 2020-11-03 NOTE — Discharge Summary (Signed)
Physician Discharge Summary  BERTON Lewis [id number] DOB: February 16, 1941 DOA: 10/31/2020  PCP: Biagio Borg, MD  Admit date: 10/31/2020 Discharge date: 11/03/2020  Admitted From: Home Disposition:  Home  Recommendations for Outpatient Follow-up:  Follow up with PCP in 1 week Follow up with IR in 2 weeks   Discharge Condition: Stable CODE STATUS: DNR  Diet recommendation:  Diet Orders (From admission, onward)     Start     Ordered   11/01/20 0327  Diet Carb Modified Fluid consistency: Thin; Room service appropriate? Yes  Diet effective now       Question Answer Comment  Diet-HS Snack? Nothing   Calorie Level Medium 1600-2000   Fluid consistency: Thin   Room service appropriate? Yes      11/01/20 0328           Brief/Interim Summary: Jonathan Lewis is a 80 y.o. male with medical history significant for diet-controlled diabetes mellitus, hypertension, acoustic neuroma, gastric cancer status post distal gastrectomy, and biliary obstruction in June with cholecystitis and Enterobacter aerogenes bacteremia, now presenting to the emergency department with fever and chills.  Patient had percutaneous biliary drain and cholecystostomy tubes placed in June.  He had been experiencing increased abdominal pain and nausea few days ago and the tubes were exchanged on 10/30/2020.  He developed subjective fever and chills the day of the tube exchange but has not had any more abdominal pain or vomiting.  He describes shaking chills.  IR was consulted, patient treated with IV antibiotics.  Blood cultures were negative to date.  Patient's fever curve improved, vitals better.  On day of discharge, patient had no complaints of fevers, chills, abdominal pain, nausea or vomiting.  Discharge Diagnoses:  Principal Problem:   Sepsis ([place]) Active Problems:   Diet-controlled diabetes mellitus ([place])   Gastric cancer pT3pN2 s/p distal gastrectomy with Billroth II gastrojejunostomy 03/19/2019    Normocytic anemia   Chronic combined systolic and diastolic CHF (congestive heart failure) ([place])   Hyponatremia   Sepsis with concern for asending cholangitis -History of biliary obstruction, cholecystitis, history of Enterobacter bacteremia in June.  Status post percutaneous biliary drain, cholecystostomy tube which were exchanged 7/29 -Presented with fever 103.1, tachycardia 109 -CT A/P: Persistent, possibly slightly worsened, intra-hepatic biliary ductal dilatation in the setting of both a percutaneous transhepatic biliary drainage catheter and cholecystostomy tube in appropriate position. Correlate clinically for possible cholangitis. -Blood cultures negative to date -Cefepime, Flagyl --> Cipro and Flagyl -Discussed with GI over secure chat, IR is consulted and following.  Follow-up outpatient cholangiogram in 2 weeks -Improved, afebrile last 24 hours   Chronic systolic and diastolic heart failure -Received IV fluids for concern for sepsis -Monitor  Hyponatremia -Improved and stable  Gastric adenocarcinoma  -Status post distal gastrectomy 2020 -Followed by Dr. Marin Olp  Discharge Instructions  Discharge Instructions     Call MD for:  difficulty breathing, headache or visual disturbances   Complete by: As directed    Call MD for:  extreme fatigue   Complete by: As directed    Call MD for:  persistant dizziness or light-headedness   Complete by: As directed    Call MD for:  persistant nausea and vomiting   Complete by: As directed    Call MD for:  redness, tenderness, or signs of infection (pain, swelling, redness, odor or green/yellow discharge around incision site)   Complete by: As directed    Call MD for:  severe uncontrolled pain   Complete by: As  directed    Call MD for:  temperature >100.4   Complete by: As directed    Discharge instructions   Complete by: As directed    You were cared for by a hospitalist during your hospital stay. If you have any questions about  your discharge medications or the care you received while you were in the hospital after you are discharged, you can call the unit and ask to speak with the hospitalist on call if the hospitalist that took care of you is not available. Once you are discharged, your primary care physician will handle any further medical issues. Please note that NO REFILLS for any discharge medications will be authorized once you are discharged, as it is imperative that you return to your primary care physician (or establish a relationship with a primary care physician if you do not have one) for your aftercare needs so that they can reassess your need for medications and monitor your lab values.   Increase activity slowly   Complete by: As directed       Allergies as of 11/03/2020   No Known Allergies      Medication List     TAKE these medications    ciprofloxacin 500 MG tablet Commonly known as: Cipro Take 1 tablet (500 mg total) by mouth 2 (two) times daily for 7 days.   dorzolamide-timolol 22.3-6.8 MG/ML ophthalmic solution Commonly known as: COSOPT Place 1 drop into both eyes 2 (two) times daily.   famotidine 20 MG tablet Commonly known as: PEPCID TAKE 1 TABLET BY MOUTH EVERY DAY What changed:  when to take this reasons to take this   latanoprost 0.005 % ophthalmic solution Commonly known as: XALATAN Place 1 drop into both eyes at bedtime.   lidocaine-prilocaine cream Commonly known as: EMLA Apply 1 application topically as needed. Place on the port one hour before appointment.   meclizine 12.5 MG tablet Commonly known as: ANTIVERT TAKE 1 TABLET BY MOUTH 3 TIMES DAILY AS NEEDED FOR DIZZINESS. What changed: See the new instructions.   metroNIDAZOLE 500 MG tablet Commonly known as: Flagyl Take 1 tablet (500 mg total) by mouth 3 (three) times daily for 7 days.   multivitamin with minerals Tabs tablet Take 1 tablet by mouth in the morning. Men's One-A-Day Chewable   ondansetron 8 MG  tablet Commonly known as: ZOFRAN Take 1 tablet (8 mg total) by mouth every 8 (eight) hours as needed for nausea or vomiting.   senna-docusate 8.6-50 MG tablet Commonly known as: Senokot S Take 1 tablet by mouth daily. What changed:  when to take this reasons to take this   Systane Ultra 0.4-0.3 % Soln Generic drug: Polyethyl Glycol-Propyl Glycol Place 1 drop into both eyes 3 (three) times daily as needed (dry eyes).        Follow-up Information     Biagio Borg, MD. Schedule an appointment as soon as possible for a visit in 1 week(s).   Specialties: Internal Medicine, Radiology Contact information: [place]  [address] [id number] [place]. Schedule an appointment as soon as possible for a visit in 2 week(s).   Specialty: Radiology Contact information: [place] [id number] [place] [phone]               No Known Allergies  Consultations: IR    Procedures/Studies: DG Chest 2 View  Result Date: 10/31/2020 CLINICAL DATA:  Sepsis  EXAM: CHEST - 2 VIEW COMPARISON:  09/21/2020 FINDINGS: Lungs are well expanded, symmetric, and clear. No pneumothorax or pleural effusion. Cardiac size within normal limits. Pulmonary vascularity is normal. Right internal jugular chest port tip is seen at the superior cavoatrial junction, unchanged osseous structures are age-appropriate. No acute bone abnormality. IMPRESSION: No active cardiopulmonary disease. Electronically Signed   By: Fidela Salisbury MD   On: 10/31/2020 21:53   CT ABDOMEN PELVIS W CONTRAST  Result Date: 11/01/2020 CLINICAL DATA:  Abdominal pain, fever. complaining of fever and chills that started 7/29. Patient has stomach cancer EXAM: CT ABDOMEN AND PELVIS WITH CONTRAST TECHNIQUE: Multidetector CT imaging of the abdomen and pelvis was performed using the standard protocol following bolus  administration of intravenous contrast. CONTRAST:  85m OMNIPAQUE IOHEXOL 350 MG/ML SOLN COMPARISON:  CT abdomen pelvis 09/21/2020 FINDINGS: Lower chest: Calcified micronodule within the right middle lobe. Subsegmental bilateral lower lobe atelectasis. Central venous catheter terminates at the superior cavoatrial junction. Hepatobiliary: Redemonstration of less conspicuous 1.4 cm hepatic lesion (2:28). Interval resolution of a hydropic gallbladder. Interval placement of a cholecystostomy tube terminating within the gallbladder lumen. No CT evidence of definite gallstones, gallbladder wall thickening, or pericholecystic fluid. Redemonstration of a transhepatic common bile duct stent with pigtail terminating within the second portion of the duodenum just distal to the ampullary. Persistent, possibly slightly worsened, intrahepatic biliary ductal dilatation. No definite biliary duct wall thickening. Pancreas: No focal lesion. Normal pancreatic contour. No surrounding inflammatory changes. No main pancreatic ductal dilatation. Spleen: Normal in size without focal abnormality. Adrenals/Urinary Tract: No adrenal nodule bilaterally. Bilateral kidneys enhance symmetrically. Couple calcified stones within the right kidney measuring up to 3 mm. No left nephrolithiasis. No hydronephrosis. No hydroureter. No ureterolithiasis bilaterally. The urinary bladder is unremarkable. Stomach/Bowel: Surgical changes related to partial gastric resection again noted. Stomach is within normal limits. No evidence of bowel wall thickening or dilatation. Appendix appears normal. Vascular/Lymphatic: No abdominal aorta or iliac aneurysm. Mild atherosclerotic plaque of the aorta and its branches. No abdominal, pelvic, or inguinal lymphadenopathy. Reproductive: Prostate is unremarkable. Other: Right upper quadrant peritoneal soft tissue densities measuring 0.8 10, 0.7, 0.8 cm. More finding within the left upper abdomen measuring 1.2 cm. Other  regions of peritoneal carcinomatosis also noted (2:38). Trace perihepatic free fluid (5:44). No intraperitoneal free gas. No organized fluid collection. Musculoskeletal: No abdominal wall hernia or abnormality. No suspicious lytic or blastic osseous lesions. No acute displaced fracture. Multilevel degenerative changes of the spine. IMPRESSION: 1. Gastric cancer with peritoneal carcinomatosis and subcapsular implant along the left hepatic lobe. 2. Persistent, possibly slightly worsened, intra-hepatic biliary ductal dilatation in the setting of both a percutaneous transhepatic biliary drainage catheter and cholecystostomy tube in appropriate position. Correlate clinically for possible cholangitis. Electronically Signed   By: MIven FinnM.D.   On: 11/01/2020 01:59   IR EXCHANGE BILIARY DRAIN  Result Date: 10/30/2020 INDICATION: History of gastric cancer complicated by development of obstructive jaundice, post placement of internal/external drainage catheter on 09/10/2020 with subsequent fluoroscopic guided exchange and up sizing 6/23/2 as well as image guided placement a cholecystostomy tube 09/04/2020 Patient presents today for routine fluoroscopic guided exchange of both catheters. The patient's biliary drainage catheter remains capped for which he has tolerated well. The cholecystostomy tube remains connected gravity bag. EXAM: 1. FLUOROSCOPIC GUIDED LEFT-SIDED PERCUTANEOUS BILIARY DRAINAGE CATHETER 2. FLUOROSCOPIC GUIDED CHOLECYSTOSTOMY TUBE EXCHANGE COMPARISON:  COMPARISON Cholangiogram via existing percutaneous biliary drainage catheter and cholecystostomy tube-09/29/2020 CONTRAST:  A total of 25 cc Omnipaque  300 was administered into the gallbladder lumen and the biliary tree. FLUOROSCOPY TIME:  1 minute, 24 seconds (24 mGy) COMPLICATIONS: None immediate. TECHNIQUE: Informed written consent was obtained from the patient after a discussion of the risks, benefits and alternatives to treatment. Questions  regarding the procedure were encouraged and answered. A timeout was performed prior to the initiation of the procedure. The external portion of the existing left-sided percutaneous biliary drainage catheter as well as the cholecystostomy tube and surrounding skin were prepped and draped in the usual sterile fashion. A sterile drape was applied covering the operative field. Maximum barrier sterile technique with sterile gowns and gloves were used for the procedure. A timeout was performed prior to the initiation of the procedure. Preprocedural spot fluoroscopic image was obtained both the cholecystostomy tube as well as the biliary catheter. A pre procedural spot fluoroscopic image was obtained after contrast was injected via the existing biliary drainage catheter. The existing biliary drainage catheter was cut and cannulated with a stiff Glidewire wire which was coiled within the proximal small bowel. Under intermittent fluoroscopic guidance, the existing PBD was exchanged for a new 12 [place] PBD. Small amount of contrast was injected via the exchanged biliary drainage catheter and a post exchange spot fluoroscopic image was obtained. Preprocedural spot fluoroscopic image was obtained following injection of small amount of contrast via the existing cholecystostomy tube. Next a cholecystostomy tube was cut and cannulated with a short Amplatz wire which was coiled within gallbladder lumen. Fluoroscopic guidance the existing cholecystostomy tube was exchanged for a new, 10 French drainage catheter with end coiled locked within gallbladder lumen. Small amount of contrast was injected demonstrating appropriate position functionality. Both catheters were secured to the skin entrance site within interrupted sutures and StatLock devices. The biliary drainage catheter was capped and the cholecystostomy tube was reconnected to a gravity bag was previously. Dressings were applied. The patient tolerated the above procedures  well without immediate postprocedural complication. FINDINGS: The existing percutaneous biliary catheter and cholecystostomy tube are both appropriately positioned and functioning. After successful fluoroscopic guided exchange, the new 12 French percutaneous biliary catheter is appropriately positioned with end coiled and locked within the proximal duodenum. After fluoroscopic guided exchange, the new 10 [place] cholecystostomy tube is appropriately positioned with end coiled and locked within the gallbladder lumen. IMPRESSION: 1. Successful fluoroscopic guided exchange of left sided 12 French percutaneous biliary drainage catheter. 2. Successful fluoroscopic guided exchange 10 French cholecystostomy tube. PLAN: - Continued current management of both drainage catheters including capping of the biliary drainage catheter and maintaining the cholecystostomy tube to external drainage. Neither catheter requires routine flushing. - The patient should return for routine fluoroscopic guided exchange of both drainage catheters and approximately 8 weeks. Note, consideration for internal biliary stent placement will be determined following next post treatment staging CT. Electronically Signed   By: Sandi Mariscal M.D.   On: 10/30/2020 16:36   IR EXCHANGE BILIARY DRAIN  Result Date: 10/30/2020 INDICATION: History of gastric cancer complicated by development of obstructive jaundice, post placement of internal/external drainage catheter on 09/10/2020 with subsequent fluoroscopic guided exchange and up sizing 6/23/2 as well as image guided placement a cholecystostomy tube 09/04/2020 Patient presents today for routine fluoroscopic guided exchange of both catheters. The patient's biliary drainage catheter remains capped for which he has tolerated well. The cholecystostomy tube remains connected gravity bag. EXAM: 1. FLUOROSCOPIC GUIDED LEFT-SIDED PERCUTANEOUS BILIARY DRAINAGE CATHETER 2. FLUOROSCOPIC GUIDED CHOLECYSTOSTOMY TUBE  EXCHANGE COMPARISON:  COMPARISON Cholangiogram via existing percutaneous biliary  drainage catheter and cholecystostomy tube-09/29/2020 CONTRAST:  A total of 25 cc Omnipaque 300 was administered into the gallbladder lumen and the biliary tree. FLUOROSCOPY TIME:  1 minute, 24 seconds (24 mGy) COMPLICATIONS: None immediate. TECHNIQUE: Informed written consent was obtained from the patient after a discussion of the risks, benefits and alternatives to treatment. Questions regarding the procedure were encouraged and answered. A timeout was performed prior to the initiation of the procedure. The external portion of the existing left-sided percutaneous biliary drainage catheter as well as the cholecystostomy tube and surrounding skin were prepped and draped in the usual sterile fashion. A sterile drape was applied covering the operative field. Maximum barrier sterile technique with sterile gowns and gloves were used for the procedure. A timeout was performed prior to the initiation of the procedure. Preprocedural spot fluoroscopic image was obtained both the cholecystostomy tube as well as the biliary catheter. A pre procedural spot fluoroscopic image was obtained after contrast was injected via the existing biliary drainage catheter. The existing biliary drainage catheter was cut and cannulated with a stiff Glidewire wire which was coiled within the proximal small bowel. Under intermittent fluoroscopic guidance, the existing PBD was exchanged for a new 12 [place] PBD. Small amount of contrast was injected via the exchanged biliary drainage catheter and a post exchange spot fluoroscopic image was obtained. Preprocedural spot fluoroscopic image was obtained following injection of small amount of contrast via the existing cholecystostomy tube. Next a cholecystostomy tube was cut and cannulated with a short Amplatz wire which was coiled within gallbladder lumen. Fluoroscopic guidance the existing cholecystostomy tube was  exchanged for a new, 10 French drainage catheter with end coiled locked within gallbladder lumen. Small amount of contrast was injected demonstrating appropriate position functionality. Both catheters were secured to the skin entrance site within interrupted sutures and StatLock devices. The biliary drainage catheter was capped and the cholecystostomy tube was reconnected to a gravity bag was previously. Dressings were applied. The patient tolerated the above procedures well without immediate postprocedural complication. FINDINGS: The existing percutaneous biliary catheter and cholecystostomy tube are both appropriately positioned and functioning. After successful fluoroscopic guided exchange, the new 12 French percutaneous biliary catheter is appropriately positioned with end coiled and locked within the proximal duodenum. After fluoroscopic guided exchange, the new 10 [place] cholecystostomy tube is appropriately positioned with end coiled and locked within the gallbladder lumen. IMPRESSION: 1. Successful fluoroscopic guided exchange of left sided 12 French percutaneous biliary drainage catheter. 2. Successful fluoroscopic guided exchange 10 French cholecystostomy tube. PLAN: - Continued current management of both drainage catheters including capping of the biliary drainage catheter and maintaining the cholecystostomy tube to external drainage. Neither catheter requires routine flushing. - The patient should return for routine fluoroscopic guided exchange of both drainage catheters and approximately 8 weeks. Note, consideration for internal biliary stent placement will be determined following next post treatment staging CT. Electronically Signed   By: Sandi Mariscal M.D.   On: 10/30/2020 16:36       Discharge Exam: Vitals:   11/02/20 2041 11/03/20 0506  BP: 112/79 99/64  Pulse: 82 71  Resp: 20 20  Temp: 98.5 F (36.9 C) 98.5 F (36.9 C)  SpO2: (!) 87% 100%    General: Pt is alert, awake, not in acute  distress Cardiovascular: RRR, S1/S2 +, no edema Respiratory: CTA bilaterally, no wheezing, no rhonchi, no respiratory distress, no conversational dyspnea  Abdominal: Soft, NT, ND, bowel sounds +, both biliary drain and chole drain in place  Extremities: no edema, no cyanosis Psych: Normal mood and affect, stable judgement and insight     The results of significant diagnostics from this hospitalization (including imaging, microbiology, ancillary and laboratory) are listed below for reference.     Microbiology: Recent Results (from the past 240 hour(s))  Culture, blood (Routine x 2)     Status: None (Preliminary result)   Collection Time: 10/31/20  8:56 PM   Specimen: BLOOD  Result Value Ref Range Status   Specimen Description   Final    BLOOD PORTA CATH Performed at [facility] [address], [place], [place] [address]    Special Requests   Final    BOTTLES DRAWN AEROBIC AND ANAEROBIC Blood Culture adequate volume Performed at [facility] [address], [place], [place] [address]    Culture   Final    NO GROWTH 2 DAYS Performed at [facility] [address] [place], [place] [address]    Report Status PENDING  Incomplete  Resp Panel by RT-PCR (Flu A&B, Covid) Nasopharyngeal Swab     Status: None   Collection Time: 10/31/20  9:04 PM   Specimen: Nasopharyngeal Swab; Nasopharyngeal(NP) swabs in vial transport medium  Result Value Ref Range Status   SARS Coronavirus 2 by RT PCR NEGATIVE NEGATIVE Final    Comment: (NOTE) SARS-CoV-2 target nucleic acids are NOT DETECTED.  The SARS-CoV-2 RNA is generally detectable in upper respiratory specimens during the acute phase of infection. The lowest concentration of SARS-CoV-2 viral copies this assay can detect is 138 copies/mL. A negative result does not preclude SARS-Cov-2 infection and should not be used as the sole basis for treatment or other patient management decisions. A  negative result may occur with  improper specimen collection/handling, submission of specimen other than nasopharyngeal swab, presence of viral mutation(s) within the areas targeted by this assay, and inadequate number of viral copies(<138 copies/mL). A negative result must be combined with clinical observations, patient history, and epidemiological information. The expected result is Negative.  Fact Sheet for Patients:  EntrepreneurPulse.com.au  Fact Sheet for Healthcare Providers:  IncredibleEmployment.be  This test is no t yet approved or cleared by the [place] FDA and  has been authorized for detection and/or diagnosis of SARS-CoV-2 by FDA under an Emergency Use Authorization (EUA). This EUA will remain  in effect (meaning this test can be used) for the duration of the COVID-19 declaration under Section 564(b)(1) of the Act, 21 U.S.C.section 360bbb-3(b)(1), unless the authorization is terminated  or revoked sooner.       Influenza A by PCR NEGATIVE NEGATIVE Final   Influenza B by PCR NEGATIVE NEGATIVE Final    Comment: (NOTE) The Xpert Xpress SARS-CoV-2/FLU/RSV plus assay is intended as an aid in the diagnosis of influenza from Nasopharyngeal swab specimens and should not be used as a sole basis for treatment. Nasal washings and aspirates are unacceptable for Xpert Xpress SARS-CoV-2/FLU/RSV testing.  Fact Sheet for Patients: EntrepreneurPulse.com.au  Fact Sheet for Healthcare Providers: IncredibleEmployment.be  This test is not yet approved or cleared by the [place] FDA and has been authorized for detection and/or diagnosis of SARS-CoV-2 by FDA under an Emergency Use Authorization (EUA). This EUA will remain in effect (meaning this test can be used) for the duration of the COVID-19 declaration under Section 564(b)(1) of the Act, 21 U.S.C. section 360bbb-3(b)(1), unless the authorization  is terminated or revoked.  Performed at [facility], [address] [place], [place]  [address]   Culture, blood (Routine x 2)     Status: None (Preliminary result)   Collection Time: 10/31/20  9:14 PM   Specimen: BLOOD  Result Value Ref Range Status   Specimen Description   Final    BLOOD BLOOD LEFT FOREARM Performed at [facility] [address], [place], [place] [address]    Special Requests   Final    BOTTLES DRAWN AEROBIC AND ANAEROBIC Blood Culture results may not be optimal due to an inadequate volume of blood received in culture bottles Performed at [facility] [address] [place], [place] [address]    Culture   Final    NO GROWTH 2 DAYS Performed at [facility] [address], [place], [place] [address]    Report Status PENDING  Incomplete  Urine Culture     Status: None   Collection Time: 10/31/20 10:00 PM   Specimen: In/Out Cath Urine  Result Value Ref Range Status   Specimen Description   Final    IN/OUT CATH URINE Performed at [facility] [address], [place], [place] [id number]    Special Requests   Final    NONE Performed at [facility], [place] [address] [place], [place] [address]    Culture   Final    NO GROWTH Performed at [facility], [place] [address] [place], [place] [address]    Report Status 11/02/2020 FINAL  Final     Labs: BNP (last 3 results) No results for input(s): BNP in the last 8760 hours. Basic Metabolic Panel: Recent Labs  Lab 10/27/20 1048 10/31/20 2032 11/01/20 0329 11/02/20 0350 11/03/20 0400  NA 133* 129* 129* 130* 130*  K 4.2 3.8 3.7 3.5 3.6  CL 100 99 98 100 101  CO2 '27 23 24 24 23  '$ GLUCOSE 120* 124* 126* 104* 103*  BUN '10 13 10 14 14  '$ CREATININE 0.63 0.83 0.78 0.70 0.71  CALCIUM 9.2 8.1* 8.2* 7.8* 7.7*   Liver Function Tests: Recent Labs  Lab 10/27/20 1048 10/31/20 2032 11/01/20 0329  11/02/20 0350  AST 37 44* 47* 32  ALT 27 35 36 27  ALKPHOS 257* 218* 212* 167*  BILITOT 0.8 1.0 1.5* 1.4*  PROT 6.7 6.7 6.6 6.1*  ALBUMIN 3.0* 2.6* 2.5* 2.2*   No results for input(s): LIPASE, AMYLASE in the last 168 hours. No results for input(s): AMMONIA in the last 168 hours. CBC: Recent Labs  Lab 10/27/20 1048 10/31/20 2032 11/01/20 0329 11/02/20 0350 11/03/20 0400  WBC 4.2 7.4 8.1 5.6 5.5  NEUTROABS 2.2 4.0  --   --   --   HGB 10.4* 10.2* 10.7* 9.1* 9.5*  HCT 31.7* 30.5* 32.6* 27.7* 28.8*  MCV 86.4 85.2 86.2 87.7 87.0  PLT 159 155 146* 122* 170   Cardiac Enzymes: No results for input(s): CKTOTAL, CKMB, CKMBINDEX, TROPONINI in the last 168 hours. BNP: Invalid input(s): POCBNP CBG: No results for input(s): GLUCAP in the last 168 hours. D-Dimer No results for input(s): DDIMER in the last 72 hours. Hgb A1c No results for input(s): HGBA1C in the last 72 hours. Lipid Profile No results for input(s): CHOL, HDL, LDLCALC, TRIG, CHOLHDL, LDLDIRECT in the last 72 hours. Thyroid function studies No results for input(s): TSH, T4TOTAL, T3FREE, THYROIDAB in the last 72 hours.  Invalid input(s): FREET3 Anemia work up No results for input(s): VITAMINB12, FOLATE, FERRITIN, TIBC, IRON, RETICCTPCT in the last 72 hours. Urinalysis  Component Value Date/Time   COLORURINE YELLOW 10/31/2020 2200   APPEARANCEUR CLEAR 10/31/2020 2200   LABSPEC 1.015 10/31/2020 2200   PHURINE 5.0 10/31/2020 2200   GLUCOSEU NEGATIVE 10/31/2020 2200   GLUCOSEU NEGATIVE 04/22/2020 1246   HGBUR NEGATIVE 10/31/2020 2200   HGBUR negative 11/30/2009 1450   BILIRUBINUR NEGATIVE 10/31/2020 2200   KETONESUR NEGATIVE 10/31/2020 2200   PROTEINUR NEGATIVE 10/31/2020 2200   UROBILINOGEN 0.2 04/22/2020 1246   NITRITE NEGATIVE 10/31/2020 2200   LEUKOCYTESUR NEGATIVE 10/31/2020 2200   Sepsis Labs Invalid input(s): PROCALCITONIN,  WBC,  LACTICIDVEN Microbiology Recent Results (from the past 240 hour(s))   Culture, blood (Routine x 2)     Status: None (Preliminary result)   Collection Time: 10/31/20  8:56 PM   Specimen: BLOOD  Result Value Ref Range Status   Specimen Description   Final    BLOOD PORTA CATH Performed at [facility], [place] [address], [place], [place] [address]    Special Requests   Final    BOTTLES DRAWN AEROBIC AND ANAEROBIC Blood Culture adequate volume Performed at [place] [address], [place], [place] [address]    Culture   Final    NO GROWTH 2 DAYS Performed at [place] [address], [place], [place] [address]    Report Status PENDING  Incomplete  Resp Panel by RT-PCR (Flu A&B, Covid) Nasopharyngeal Swab     Status: None   Collection Time: 10/31/20  9:04 PM   Specimen: Nasopharyngeal Swab; Nasopharyngeal(NP) swabs in vial transport medium  Result Value Ref Range Status   SARS Coronavirus 2 by RT PCR NEGATIVE NEGATIVE Final    Comment: (NOTE) SARS-CoV-2 target nucleic acids are NOT DETECTED.  The SARS-CoV-2 RNA is generally detectable in upper respiratory specimens during the acute phase of infection. The lowest concentration of SARS-CoV-2 viral copies this assay can detect is 138 copies/mL. A negative result does not preclude SARS-Cov-2 infection and should not be used as the sole basis for treatment or other patient management decisions. A negative result may occur with  improper specimen collection/handling, submission of specimen other than nasopharyngeal swab, presence of viral mutation(s) within the areas targeted by this assay, and inadequate number of viral copies(<138 copies/mL). A negative result must be combined with clinical observations, patient history, and epidemiological information. The expected result is Negative.  Fact Sheet for Patients:  EntrepreneurPulse.com.au  Fact Sheet for Healthcare Providers:  IncredibleEmployment.be  This test is no  t yet approved or cleared by the [place] FDA and  has been authorized for detection and/or diagnosis of SARS-CoV-2 by FDA under an Emergency Use Authorization (EUA). This EUA will remain  in effect (meaning this test can be used) for the duration of the COVID-19 declaration under Section 564(b)(1) of the Act, 21 U.S.C.section 360bbb-3(b)(1), unless the authorization is terminated  or revoked sooner.       Influenza A by PCR NEGATIVE NEGATIVE Final   Influenza B by PCR NEGATIVE NEGATIVE Final    Comment: (NOTE) The Xpert Xpress SARS-CoV-2/FLU/RSV plus assay is intended as an aid in the diagnosis of influenza from Nasopharyngeal swab specimens and should not be used as a sole basis for treatment. Nasal washings and aspirates are unacceptable for Xpert Xpress SARS-CoV-2/FLU/RSV testing.  Fact Sheet for Patients: EntrepreneurPulse.com.au  Fact Sheet for Healthcare Providers: IncredibleEmployment.be  This test is not yet approved or cleared by the [place] FDA and has been authorized for detection and/or diagnosis of SARS-CoV-2  by FDA under an Emergency Use Authorization (EUA). This EUA will remain in effect (meaning this test can be used) for the duration of the COVID-19 declaration under Section 564(b)(1) of the Act, 21 U.S.C. section 360bbb-3(b)(1), unless the authorization is terminated or revoked.  Performed at [facility], [place] [address] [place], [place] [address]   Culture, blood (Routine x 2)     Status: None (Preliminary result)   Collection Time: 10/31/20  9:14 PM   Specimen: BLOOD  Result Value Ref Range Status   Specimen Description   Final    BLOOD BLOOD LEFT FOREARM Performed at [place] [address] [place] [address], [place], [place] [address]    Special Requests   Final    BOTTLES DRAWN AEROBIC AND ANAEROBIC Blood Culture results may not be optimal due to an inadequate volume of blood  received in culture bottles Performed at [facility] [address], [place], [place] [address]    Culture   Final    NO GROWTH 2 DAYS Performed at [place] [facility] [address], [place], [place] [address]    Report Status PENDING  Incomplete  Urine Culture     Status: None   Collection Time: 10/31/20 10:00 PM   Specimen: In/Out Cath Urine  Result Value Ref Range Status   Specimen Description   Final    IN/OUT CATH URINE Performed at [place] [address] [place], [place] [address]    Special Requests   Final    NONE Performed at [facility], [place] [address] [place], [place] [address]    Culture   Final    NO GROWTH Performed at [facility], [place] [address], [place], [place] [address]    Report Status 11/02/2020 FINAL  Final     Patient was seen and examined on the day of discharge and was found to be in stable condition. Time coordinating discharge: 25 minutes including assessment and coordination of care, as well as examination of the patient.   SIGNED:  Dessa Phi, DO [facility] 11/03/2020, 10:23 AM

## 2020-11-03 NOTE — Progress Notes (Signed)
Pharmacy Antibiotic Note  Jonathan Lewis is a 80 y.o. male with hx gastric cancer (s/p distal gastrectomy), biliary obstruction (s/p percutaneous biliary drain and cholecystostomy tubes placement, cholecystitis and Enterobacter aerogenes bacteremia presented to the ED on 10/31/2020 with fever.  Abdominal CT on 7/31 showed gastric cancer and findings with concern for cholangitis.  He's currently on cefepime and flagyl.  Today, 11/03/2020: - day #3 abx - Tmax 99.3, wbc wnl - scr stable at 0.71 (crcl ~70)  Plan: - cefepime 2gm IV q8h - flagyl 500 mg q8h - With stable renal function, pharmacy will sign off for abx consult.  Reconsult [place] if need further assistance.  ____________________________________  Height: '5\' 8"'$  (172.7 cm) Weight: 72.6 kg (160 lb) IBW/kg (Calculated) : 68.4  Temp (24hrs), Avg:98.8 F (37.1 C), Min:98.5 F (36.9 C), Max:99.3 F (37.4 C)  Recent Labs  Lab 10/27/20 1048 10/31/20 2032 10/31/20 2232 11/01/20 0329 11/01/20 0515 11/02/20 0350 11/03/20 0400  WBC 4.2 7.4  --  8.1  --  5.6 5.5  CREATININE 0.63 0.83  --  0.78  --  0.70 0.71  LATICACIDVEN  --  1.8 2.3*  --  2.1*  --   --     Estimated Creatinine Clearance: 70.1 mL/min (by C-G formula based on SCr of 0.71 mg/dL).    No Known Allergies   7/30 cefepime >> 7/30 Flagyl >>   7/30 BCx x2: ngtd 7/30 UCx: ngF  Thank you for allowing pharmacy to be a part of this patient's care.  Lynelle Doctor 11/03/2020 9:23 AM

## 2020-11-04 ENCOUNTER — Other Ambulatory Visit: Payer: Self-pay | Admitting: *Deleted

## 2020-11-04 DIAGNOSIS — C161 Malignant neoplasm of fundus of stomach: Secondary | ICD-10-CM

## 2020-11-04 DIAGNOSIS — D509 Iron deficiency anemia, unspecified: Secondary | ICD-10-CM

## 2020-11-04 DIAGNOSIS — C162 Malignant neoplasm of body of stomach: Secondary | ICD-10-CM

## 2020-11-05 ENCOUNTER — Inpatient Hospital Stay: Payer: Medicare PPO

## 2020-11-06 ENCOUNTER — Inpatient Hospital Stay: Payer: Medicare PPO

## 2020-11-06 LAB — CULTURE, BLOOD (ROUTINE X 2)
Culture: NO GROWTH
Culture: NO GROWTH
Special Requests: ADEQUATE

## 2020-11-10 ENCOUNTER — Encounter: Payer: Self-pay | Admitting: Internal Medicine

## 2020-11-10 ENCOUNTER — Ambulatory Visit: Payer: Medicare PPO | Admitting: Internal Medicine

## 2020-11-10 ENCOUNTER — Other Ambulatory Visit: Payer: Self-pay

## 2020-11-10 VITALS — BP 100/68 | HR 76 | Ht 68.0 in | Wt 156.0 lb

## 2020-11-10 DIAGNOSIS — R5381 Other malaise: Secondary | ICD-10-CM

## 2020-11-10 DIAGNOSIS — K831 Obstruction of bile duct: Secondary | ICD-10-CM | POA: Diagnosis not present

## 2020-11-10 DIAGNOSIS — R269 Unspecified abnormalities of gait and mobility: Secondary | ICD-10-CM | POA: Insufficient documentation

## 2020-11-10 DIAGNOSIS — I5042 Chronic combined systolic (congestive) and diastolic (congestive) heart failure: Secondary | ICD-10-CM | POA: Diagnosis not present

## 2020-11-10 NOTE — Patient Instructions (Signed)
You are given the prescription for the Rollater walker to take to any medical supply store, such as [facility]  Please continue all other medications as before, and refills have been done if requested.  Please have the pharmacy call with any other refills you may need  Please keep your appointments with your specialists as you may have planned  Please make an Appointment to return in 3 months, or sooner if needed

## 2020-11-10 NOTE — Progress Notes (Signed)
[name], male   DOB: 02-10-1941, 80 y.o.   MRN: [id number]        Chief Complaint: follow up hospn July 30 - aug 2       HPI:  Jonathan Lewis is a 80 y.o. male here with above; overall doing well; Pt denies chest pain, increased sob or doe, wheezing, orthopnea, PND, increased LE swelling, palpitations, dizziness or syncope.   Pt denies polydipsia, polyuria, or new focal neuro s/s.  Denies worsening reflux, abd pain, dysphagia, n/v, bowel change or blood.  DOes have worsening generalized weakness, low energy.  Has 3 more days antibx to finish.   Pt denies fever, wt loss, night sweats, loss of appetite, or other constitutional symptoms  Does have difficulty walking, asking for rollater.  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   Diabetes mellitus    Type II. Per pt Dr. Jenny Reichmann took him off his dm med.   Disk prolapse    Glaucoma    Hyperlipidemia    Hypertension    Male hypogonadism 07/16/2014   Neuropathy    Pneumonia    Sinus bradycardia    chronic, asymptomatic   Sinusitis 07/29/2012   Past Surgical History:  Procedure Laterality Date   ANTERIOR CERVICAL DECOMPRESSION/DISCECTOMY FUSION 4 LEVELS N/A 01/08/2020   Procedure: ANTERIOR CERVICAL DECOMPRESSION FUSION CERVICAL 3-4, CERVICAL 4-5, CERVIAL 5-6 WITH INSTRUMENTATION AND ALLOGRAFT;  Surgeon: Phylliss Bob, MD;  Location: [place];  Service: Orthopedics;  Laterality: N/A;   BALLOON DILATION N/A  03/12/2019   Procedure: BALLOON DILATION;  Surgeon: Rush Landmark Telford Nab., MD;  Location[facility];  Service: Gastroenterology;  Laterality: N/A;  pyloric   BIOPSY  03/08/2019   Procedure: BIOPSY;  Surgeon: Yetta Flock, MD;  Location: [facility];  Service: Gastroenterology;;   BIOPSY  03/12/2019   Procedure: BIOPSY;  Surgeon: Irving Copas., MD;  Location: [facility];  Service: Gastroenterology;;   CATARACT EXTRACTION     x 2   COLONOSCOPY     ENDOSCOPIC RETROGRADE CHOLANGIOPANCREATOGRAPHY (ERCP) WITH PROPOFOL N/A 09/09/2020   Procedure: ENDOSCOPIC RETROGRADE CHOLANGIOPANCREATOGRAPHY (ERCP) WITH PROPOFOL;  Surgeon: Irving Copas., MD;  Location: [facility];  Service: Gastroenterology;  Laterality: N/A;   ESOPHAGOGASTRODUODENOSCOPY N/A 03/08/2019   Procedure: ESOPHAGOGASTRODUODENOSCOPY (EGD);  Surgeon: Yetta Flock, MD;  Location: [facility];  Service: Gastroenterology;  Laterality: N/A;   ESOPHAGOGASTRODUODENOSCOPY (EGD) WITH PROPOFOL N/A 03/12/2019   Procedure: ESOPHAGOGASTRODUODENOSCOPY (EGD) WITH PROPOFOL;  Surgeon: Rush Landmark Telford Nab., MD;  [facility];  Service: Gastroenterology;  Laterality: N/A;   FINE NEEDLE ASPIRATION  03/12/2019   Procedure: FINE NEEDLE ASPIRATION (FNA) LINEAR;  Surgeon: Irving Copas., MD;  Location: [facility];  Service: Gastroenterology;;   FOREIGN BODY REMOVAL  03/08/2019   Procedure: FOREIGN BODY REMOVAL;  Surgeon: Yetta Flock, MD;  Location: [facility];  Service: Gastroenterology;;   IR BILIARY DRAIN PLACEMENT WITH CHOLANGIOGRAM  09/09/2020   IR CHOLANGIOGRAM EXISTING TUBE  09/29/2020   IR CHOLANGIOGRAM EXISTING TUBE  09/29/2020   IR CHOLANGIOGRAM EXISTING TUBE  11/13/2020   IR CHOLANGIOGRAM EXISTING TUBE  11/13/2020   IR EXCHANGE BILIARY DRAIN  09/24/2020   IR EXCHANGE BILIARY DRAIN  10/30/2020   IR EXCHANGE BILIARY DRAIN  10/30/2020   IR INT EXT BILIARY DRAIN WITH CHOLANGIOGRAM  09/10/2020    IR PATIENT EVAL TECH 0-60 MINS  04/03/2019   IR PERC CHOLECYSTOSTOMY  09/24/2020   LAPAROTOMY N/A 03/19/2019   Procedure: EXPLORATORY LAPAROTOMY, distal GASTRECTOMY AND PLACEMENT OF G AND J TUBE, gastric jejunostomy;  Surgeon: Greer Pickerel, MD;  Location: [facility];  Service: General;  Laterality: N/A;   POLYPECTOMY     PORTACATH PLACEMENT N/A 03/26/2019   Procedure: INSERTION PORT-A-CATH;  Surgeon: Michael Boston, MD;  Location: [facility];  Service: General;  Laterality: N/A;   [place] INJECTION  09/09/2020   Procedure: SUBMUCOSAL TATTOO INJECTION;  Surgeon: Irving Copas., MD;  Location: [facility];  Service: Gastroenterology;;   UPPER ESOPHAGEAL ENDOSCOPIC ULTRASOUND (EUS) N/A 03/12/2019   Procedure: UPPER ESOPHAGEAL ENDOSCOPIC ULTRASOUND (EUS);  Surgeon: Irving Copas., MD;  Location: [facility];  Service: Gastroenterology;  Laterality: N/A;   UPPER GASTROINTESTINAL ENDOSCOPY     VIDEO BRONCHOSCOPY WITH ENDOBRONCHIAL ULTRASOUND N/A 05/22/2019   Procedure: VIDEO BRONCHOSCOPY WITH ENDOBRONCHIAL ULTRASOUND;  Surgeon: Garner Nash, DO;  Location: [facility];  Service: Thoracic;  Laterality: N/A;    reports that he quit smoking about 42 years ago. His smoking use included cigarettes. He has a 24.00 pack-year smoking history. He has never used smokeless tobacco. He reports that he does not currently use alcohol. He reports that he does not use drugs. family history includes Cancer in his brother and mother; Diabetes in his sister; Hyperlipidemia in his brother; Hypertension in his brother, mother, and sister; Stroke in his brother. No Known Allergies Current Outpatient Medications on File Prior to Visit  Medication Sig Dispense Refill   dorzolamide-timolol (COSOPT) 22.3-6.8 MG/ML ophthalmic solution Place 1 drop into both eyes 2 (two) times daily.     famotidine (PEPCID) 20 MG tablet TAKE 1 TABLET BY MOUTH EVERY DAY 90 tablet 0   latanoprost (XALATAN)  0.005 % ophthalmic solution Place 1 drop into both eyes at bedtime.      lidocaine-prilocaine (EMLA) cream Apply 1 application topically as needed. Place on the port one hour before appointment. 30 g 3   meclizine (ANTIVERT) 12.5 MG tablet TAKE 1 TABLET BY MOUTH 3 TIMES DAILY AS NEEDED FOR DIZZINESS. 30 tablet 2   Multiple Vitamin (MULTIVITAMIN WITH MINERALS) TABS tablet Take 1 tablet by mouth in the morning. Men's One-A-Day Chewable     ondansetron (ZOFRAN) 8 MG tablet Take 1 tablet (8 mg total) by mouth every 8 (eight) hours as needed for nausea or vomiting. 30 tablet 3   senna-docusate (SENOKOT S) 8.6-50 MG tablet Take 1 tablet by mouth daily. 30 tablet 0   SYSTANE ULTRA 0.4-0.3 % SOLN Place 1 drop into both eyes 3 (three) times daily as needed (dry eyes).     No current facility-administered medications on file prior to visit.        ROS:  All others reviewed and negative.  Objective        PE:  BP 100/68 (BP  Location: Left Arm, Patient Position: Sitting, Cuff Size: Normal)   Pulse 76   Ht '5\' 8"'$  (1.727 m)   Wt 156 lb (70.8 kg)   SpO2 96%   BMI 23.72 kg/m                 Constitutional: Pt appears in NAD               HENT: Head: NCAT.                Right Ear: External ear normal.                 Left Ear: External ear normal.                Eyes: . Pupils are equal, round, and reactive to light. Conjunctivae and EOM are normal               Nose: without d/c or deformity               Neck: Neck supple. Gross normal ROM               Cardiovascular: Normal rate and regular rhythm.                 Pulmonary/Chest: Effort normal and breath sounds without rales or wheezing.                Abd:  Soft, NT, ND, + BS, no organomegaly               Neurological: Pt is alert. At baseline orientation, motor grossly intact               Skin: Skin is warm. No rashes, no other new lesions, LE edema - none               Psychiatric: Pt behavior is normal without agitation   Micro:  none  Cardiac tracings I have personally interpreted today:  none  Pertinent Radiological findings (summarize): none   Lab Results  Component Value Date   WBC 5.5 11/03/2020   HGB 9.5 (L) 11/03/2020   HCT 28.8 (L) 11/03/2020   PLT 170 11/03/2020   GLUCOSE 103 (H) 11/03/2020   CHOL 210 (H) 04/22/2020   TRIG 106.0 04/22/2020   HDL 44.00 04/22/2020   LDLDIRECT 121.0 11/07/2017   LDLCALC 145 (H) 04/22/2020   ALT 27 11/02/2020   AST 32 11/02/2020   NA 130 (L) 11/03/2020   K 3.6 11/03/2020   CL 101 11/03/2020   CREATININE 0.71 11/03/2020   BUN 14 11/03/2020   CO2 23 11/03/2020   TSH 3.401 09/14/2020   PSA 1.76 05/14/2018   INR 1.1 10/31/2020   HGBA1C 6.1 (H) 09/10/2020   MICROALBUR <0.7 04/22/2020   Assessment/Plan:  KYSEEM LYNSKEY is a 80 y.o. Black or African American [2] male with  has a past medical history of Acoustic neuroma ([place]) (06/25/2015), Allergic rhinitis (07/18/2014), Allergy, Anemia, Arthritis, BPH (benign prostatic hyperplasia), Cancer ([place]), Carbuncle, Cataract, Diabetes mellitus, Disk prolapse, Glaucoma, Hyperlipidemia, Hypertension, Male hypogonadism (07/16/2014), Neuropathy, Pneumonia, Sinus bradycardia, and Sinusitis (07/29/2012).  Physical deconditioning declines PT for now  Gait disorder Mild recent worsening, for Rollater walker rx  Chronic combined systolic and diastolic CHF (congestive heart failure) (HCC) Stable volume overall, cont current med tx  Biliary obstruction Has 2 drain appearing to drain well, f/u IR as planned  Followup: No follow-ups on file.  Cathlean Cower, MD 11/13/2020 8:47 PM  [facility]

## 2020-11-13 ENCOUNTER — Other Ambulatory Visit (HOSPITAL_COMMUNITY): Payer: Self-pay | Admitting: Radiology

## 2020-11-13 ENCOUNTER — Encounter: Payer: Self-pay | Admitting: Internal Medicine

## 2020-11-13 ENCOUNTER — Other Ambulatory Visit: Payer: Self-pay

## 2020-11-13 ENCOUNTER — Ambulatory Visit (HOSPITAL_COMMUNITY)
Admission: RE | Admit: 2020-11-13 | Discharge: 2020-11-13 | Disposition: A | Discharge status: Home / Self Care | Payer: Medicare PPO | Source: Ambulatory Visit | Attending: Radiology | Admitting: Radiology

## 2020-11-13 DIAGNOSIS — K831 Obstruction of bile duct: Secondary | ICD-10-CM | POA: Insufficient documentation

## 2020-11-13 HISTORY — PX: IR CHOLANGIOGRAM EXISTING TUBE: IMG6040

## 2020-11-13 MED ORDER — IOHEXOL 300 MG/ML  SOLN
20.0000 mL | Freq: Once | INTRAMUSCULAR | Status: AC | PRN
  Administered 2020-11-13: 10 mL

## 2020-11-13 NOTE — Assessment & Plan Note (Signed)
Has 2 drain appearing to drain well, f/u IR as planned

## 2020-11-13 NOTE — Assessment & Plan Note (Signed)
declines PT for now

## 2020-11-13 NOTE — Assessment & Plan Note (Signed)
Stable volume overall, cont current med tx

## 2020-11-13 NOTE — Assessment & Plan Note (Signed)
Mild recent worsening, for Rollater walker rx

## 2020-11-13 NOTE — Procedures (Signed)
Interventional Radiology Procedure:   Indications: Follow up biliary drains  Procedure: Injection of int/ext biliary drain and cholecystostomy tube  Findings: Int/ext biliary drain is functioning well and adequately positioned with drainage into duodenum.  Cholecystostomy tube is in gallbladder but cystic duct is obstructed.  Complications: No immediate complications noted.     EBL: None  Plan: Routine exchanges of both tubes.   Keep cholecystostomy tube to gravity bag.  Will cap int/ext biliary drain.  No routine flushing.    Jonathan Lewis R. Anselm Pancoast, MD  Pager: [phone]

## 2020-11-16 ENCOUNTER — Inpatient Hospital Stay: Payer: Medicare PPO

## 2020-11-16 ENCOUNTER — Encounter: Payer: Self-pay | Admitting: Hematology & Oncology

## 2020-11-16 ENCOUNTER — Other Ambulatory Visit: Payer: Self-pay

## 2020-11-16 ENCOUNTER — Ambulatory Visit: Payer: Medicare PPO | Admitting: Hematology & Oncology

## 2020-11-16 ENCOUNTER — Inpatient Hospital Stay (HOSPITAL_BASED_OUTPATIENT_CLINIC_OR_DEPARTMENT_OTHER): Payer: Medicare PPO | Admitting: Hematology & Oncology

## 2020-11-16 ENCOUNTER — Ambulatory Visit: Payer: Medicare PPO

## 2020-11-16 ENCOUNTER — Ambulatory Visit (HOSPITAL_BASED_OUTPATIENT_CLINIC_OR_DEPARTMENT_OTHER)
Admission: RE | Admit: 2020-11-16 | Discharge: 2020-11-16 | Disposition: A | Discharge status: Home / Self Care | Payer: Medicare PPO | Source: Ambulatory Visit | Attending: Hematology & Oncology | Admitting: Hematology & Oncology

## 2020-11-16 ENCOUNTER — Inpatient Hospital Stay: Payer: Medicare PPO | Attending: Hematology & Oncology

## 2020-11-16 VITALS — BP 93/76 | HR 82 | Temp 98.6°F | Resp 18 | Wt 157.0 lb

## 2020-11-16 DIAGNOSIS — K6389 Other specified diseases of intestine: Secondary | ICD-10-CM | POA: Diagnosis not present

## 2020-11-16 DIAGNOSIS — C169 Malignant neoplasm of stomach, unspecified: Secondary | ICD-10-CM | POA: Diagnosis not present

## 2020-11-16 DIAGNOSIS — C772 Secondary and unspecified malignant neoplasm of intra-abdominal lymph nodes: Secondary | ICD-10-CM

## 2020-11-16 DIAGNOSIS — C162 Malignant neoplasm of body of stomach: Secondary | ICD-10-CM | POA: Insufficient documentation

## 2020-11-16 DIAGNOSIS — Z79899 Other long term (current) drug therapy: Secondary | ICD-10-CM | POA: Insufficient documentation

## 2020-11-16 DIAGNOSIS — D508 Other iron deficiency anemias: Secondary | ICD-10-CM | POA: Insufficient documentation

## 2020-11-16 DIAGNOSIS — C161 Malignant neoplasm of fundus of stomach: Secondary | ICD-10-CM | POA: Diagnosis not present

## 2020-11-16 DIAGNOSIS — Z5111 Encounter for antineoplastic chemotherapy: Secondary | ICD-10-CM | POA: Insufficient documentation

## 2020-11-16 DIAGNOSIS — N200 Calculus of kidney: Secondary | ICD-10-CM | POA: Diagnosis not present

## 2020-11-16 DIAGNOSIS — K4090 Unilateral inguinal hernia, without obstruction or gangrene, not specified as recurrent: Secondary | ICD-10-CM | POA: Diagnosis not present

## 2020-11-16 LAB — LACTATE DEHYDROGENASE: LDH: 136 U/L (ref 98–192)

## 2020-11-16 LAB — CBC WITH DIFFERENTIAL (CANCER CENTER ONLY)
Abs Immature Granulocytes: 0.01 10*3/uL (ref 0.00–0.07)
Basophils Absolute: 0 10*3/uL (ref 0.0–0.1)
Basophils Relative: 1 %
Eosinophils Absolute: 0 10*3/uL (ref 0.0–0.5)
Eosinophils Relative: 1 %
HCT: 30.7 % — ABNORMAL LOW (ref 39.0–52.0)
Hemoglobin: 10.1 g/dL — ABNORMAL LOW (ref 13.0–17.0)
Immature Granulocytes: 0 %
Lymphocytes Relative: 27 %
Lymphs Abs: 1.2 10*3/uL (ref 0.7–4.0)
MCH: 28.5 pg (ref 26.0–34.0)
MCHC: 32.9 g/dL (ref 30.0–36.0)
MCV: 86.5 fL (ref 80.0–100.0)
Monocytes Absolute: 0.4 10*3/uL (ref 0.1–1.0)
Monocytes Relative: 10 %
Neutro Abs: 2.7 10*3/uL (ref 1.7–7.7)
Neutrophils Relative %: 61 %
Platelet Count: 218 10*3/uL (ref 150–400)
RBC: 3.55 MIL/uL — ABNORMAL LOW (ref 4.22–5.81)
RDW: 15.4 % (ref 11.5–15.5)
WBC Count: 4.3 10*3/uL (ref 4.0–10.5)
nRBC: 0 % (ref 0.0–0.2)

## 2020-11-16 LAB — CMP (CANCER CENTER ONLY)
ALT: 21 U/L (ref 0–44)
AST: 43 U/L — ABNORMAL HIGH (ref 15–41)
Albumin: 2.7 g/dL — ABNORMAL LOW (ref 3.5–5.0)
Alkaline Phosphatase: 234 U/L — ABNORMAL HIGH (ref 38–126)
Anion gap: 7 (ref 5–15)
BUN: 7 mg/dL — ABNORMAL LOW (ref 8–23)
CO2: 25 mmol/L (ref 22–32)
Calcium: 8.6 mg/dL — ABNORMAL LOW (ref 8.9–10.3)
Chloride: 105 mmol/L (ref 98–111)
Creatinine: 0.59 mg/dL — ABNORMAL LOW (ref 0.61–1.24)
GFR, Estimated: >60 mL/min (ref >60)
Glucose, Bld: 153 mg/dL — ABNORMAL HIGH (ref 70–99)
Potassium: 3.3 mmol/L — ABNORMAL LOW (ref 3.5–5.1)
Sodium: 137 mmol/L (ref 135–145)
Total Bilirubin: 0.7 mg/dL (ref 0.3–1.2)
Total Protein: 5.8 g/dL — ABNORMAL LOW (ref 6.5–8.1)

## 2020-11-16 LAB — SAMPLE TO BLOOD BANK

## 2020-11-16 LAB — CEA (IN HOUSE-CHCC): CEA (CHCC-In House): <1 ng/mL (ref 0.00–5.00)

## 2020-11-16 MED ORDER — SODIUM CHLORIDE 0.9 % IV SOLN
150.0000 mg | Freq: Once | INTRAVENOUS | Status: AC
  Administered 2020-11-16: 150 mg via INTRAVENOUS
  Filled 2020-11-16: qty 150

## 2020-11-16 MED ORDER — SODIUM CHLORIDE 0.9 % IV SOLN: 200.0000 mg | Freq: Once | INTRAVENOUS | Status: DC

## 2020-11-16 MED ORDER — SODIUM CHLORIDE 0.9 % IV SOLN
160.0000 mg/m2 | Freq: Once | INTRAVENOUS | Status: AC
  Administered 2020-11-16: 300 mg via INTRAVENOUS
  Filled 2020-11-16: qty 50

## 2020-11-16 MED ORDER — HEPARIN SOD (PORK) LOCK FLUSH 100 UNIT/ML IV SOLN
500.0000 [IU] | Freq: Once | INTRAVENOUS | Status: AC | PRN
  Administered 2020-11-16: 500 [IU]

## 2020-11-16 MED ORDER — SODIUM CHLORIDE 0.9 % IV SOLN: Freq: Once | INTRAVENOUS | Status: AC

## 2020-11-16 MED ORDER — SODIUM CHLORIDE 0.9% FLUSH: 3.0000 mL | INTRAVENOUS | Status: DC | PRN

## 2020-11-16 MED ORDER — SODIUM CHLORIDE 0.9 % IV SOLN
10.0000 mg | Freq: Once | INTRAVENOUS | Status: AC
  Administered 2020-11-16: 10 mg via INTRAVENOUS
  Filled 2020-11-16: qty 10

## 2020-11-16 MED ORDER — DIPHENHYDRAMINE HCL 50 MG/ML IJ SOLN
50.0000 mg | Freq: Once | INTRAMUSCULAR | Status: AC
  Administered 2020-11-16: 50 mg via INTRAVENOUS
  Filled 2020-11-16: qty 1

## 2020-11-16 MED ORDER — HEPARIN SOD (PORK) LOCK FLUSH 100 UNIT/ML IV SOLN: 250.0000 [IU] | Freq: Once | INTRAVENOUS | Status: DC | PRN

## 2020-11-16 MED ORDER — SODIUM CHLORIDE 0.9 % IV SOLN
400.0000 mg | Freq: Once | INTRAVENOUS | Status: AC
  Administered 2020-11-16: 400 mg via INTRAVENOUS
  Filled 2020-11-16: qty 40

## 2020-11-16 MED ORDER — IOHEXOL 300 MG/ML  SOLN
100.0000 mL | Freq: Once | INTRAMUSCULAR | Status: AC | PRN
  Administered 2020-11-16: 100 mL via INTRAVENOUS

## 2020-11-16 MED ORDER — PALONOSETRON HCL INJECTION 0.25 MG/5ML
0.2500 mg | Freq: Once | INTRAVENOUS | Status: AC
  Administered 2020-11-16: 0.25 mg via INTRAVENOUS
  Filled 2020-11-16: qty 5

## 2020-11-16 MED ORDER — ALTEPLASE 2 MG IJ SOLR: 2.0000 mg | Freq: Once | INTRAMUSCULAR | Status: DC | PRN

## 2020-11-16 MED ORDER — FAMOTIDINE 20 MG IN NS 100 ML IVPB
20.0000 mg | Freq: Two times a day (BID) | INTRAVENOUS | Status: DC
  Administered 2020-11-16: 20 mg via INTRAVENOUS
  Filled 2020-11-16: qty 20

## 2020-11-16 MED ORDER — SODIUM CHLORIDE 0.9% FLUSH
10.0000 mL | INTRAVENOUS | Status: DC | PRN
  Administered 2020-11-16: 10 mL

## 2020-11-16 MED ORDER — DEXAMETHASONE 4 MG PO TABS: 8.0000 mg | ORAL_TABLET | Freq: Every day | ORAL | 1 refills | Status: AC

## 2020-11-16 NOTE — Patient Instructions (Signed)
Pembrolizumab injection What is this medication? PEMBROLIZUMAB (pem broe liz ue mab) is a monoclonal antibody. It is used totreat certain types of cancer. This medicine may be used for other purposes; ask your health care provider orpharmacist if you have questions. COMMON BRAND NAME(S): Keytruda What should I tell my care team before I take this medication? They need to know if you have any of these conditions: autoimmune diseases like Crohn's disease, ulcerative colitis, or lupus have had or planning to have an allogeneic stem cell transplant (uses someone else's stem cells) history of organ transplant history of chest radiation nervous system problems like myasthenia gravis or Guillain-Barre syndrome an unusual or allergic reaction to pembrolizumab, other medicines, foods, dyes, or preservatives pregnant or trying to get pregnant breast-feeding How should I use this medication? This medicine is for infusion into a vein. It is given by a health careprofessional in a hospital or clinic setting. A special MedGuide will be given to you before each treatment. Be sure to readthis information carefully each time. Talk to your pediatrician regarding the use of this medicine in children. While this drug may be prescribed for children as young as 6 months for selectedconditions, precautions do apply. Overdosage: If you think you have taken too much of this medicine contact apoison control center or emergency room at once. NOTE: This medicine is only for you. Do not share this medicine with others. What if I miss a dose? It is important not to miss your dose. Call your doctor or health careprofessional if you are unable to keep an appointment. What may interact with this medication? Interactions have not been studied. This list may not describe all possible interactions. Give your health care provider a list of all the medicines, herbs, non-prescription drugs, or dietary supplements you use. Also  tell them if you smoke, drink alcohol, or use illegaldrugs. Some items may interact with your medicine. What should I watch for while using this medication? Your condition will be monitored carefully while you are receiving thismedicine. You may need blood work done while you are taking this medicine. Do not become pregnant while taking this medicine or for 4 months after stopping it. Women should inform their doctor if they wish to become pregnant or think they might be pregnant. There is a potential for serious side effects to an unborn child. Talk to your health care professional or pharmacist for more information. Do not breast-feed an infant while taking this medicine orfor 4 months after the last dose. What side effects may I notice from receiving this medication? Side effects that you should report to your doctor or health care professionalas soon as possible: allergic reactions like skin rash, itching or hives, swelling of the face, lips, or tongue bloody or black, tarry breathing problems changes in vision chest pain chills confusion constipation cough diarrhea dizziness or feeling faint or lightheaded fast or irregular heartbeat fever flushing joint pain low blood counts - this medicine may decrease the number of white blood cells, red blood cells and platelets. You may be at increased risk for infections and bleeding. muscle pain muscle weakness pain, tingling, numbness in the hands or feet persistent headache redness, blistering, peeling or loosening of the skin, including inside the mouth signs and symptoms of high blood sugar such as dizziness; dry mouth; dry skin; fruity breath; nausea; stomach pain; increased hunger or thirst; increased urination signs and symptoms of kidney injury like trouble passing urine or change in the amount of urine signs and   symptoms of liver injury like dark urine, light-colored stools, loss of appetite, nausea, right upper belly pain,  yellowing of the eyes or skin sweating swollen lymph nodes weight loss Side effects that usually do not require medical attention (report to yourdoctor or health care professional if they continue or are bothersome): decreased appetite hair loss tiredness This list may not describe all possible side effects. Call your doctor for medical advice about side effects. You may report side effects to FDA at1-800-FDA-1088. Where should I keep my medication? This drug is given in a hospital or clinic and will not be stored at home. NOTE: This sheet is a summary. It may not cover all possible information. If you have questions about this medicine, talk to your doctor, pharmacist, orhealth care provider.  2022 [employer]/Gold Standard (2019-02-20 21:44:53) Carboplatin injection What is this medication? CARBOPLATIN (KAR boe pla tin) is a chemotherapy drug. It targets fast dividing cells, like cancer cells, and causes these cells to die. This medicine is usedto treat ovarian cancer and many other cancers. This medicine may be used for other purposes; ask your health care provider orpharmacist if you have questions. COMMON BRAND NAME(S): Paraplatin What should I tell my care team before I take this medication? They need to know if you have any of these conditions: blood disorders hearing problems kidney disease recent or ongoing radiation therapy an unusual or allergic reaction to carboplatin, cisplatin, other chemotherapy, other medicines, foods, dyes, or preservatives pregnant or trying to get pregnant breast-feeding How should I use this medication? This drug is usually given as an infusion into a vein. It is administered in [place] or clinic by a specially trained health care professional. Talk to your pediatrician regarding the use of this medicine in children.Special care may be needed. Overdosage: If you think you have taken too much of this medicine contact apoison control center or  emergency room at once. NOTE: This medicine is only for you. Do not share this medicine with others. What if I miss a dose? It is important not to miss a dose. Call your doctor or health careprofessional if you are unable to keep an appointment. What may interact with this medication? medicines for seizures medicines to increase blood counts like filgrastim, pegfilgrastim, sargramostim some antibiotics like amikacin, gentamicin, neomycin, streptomycin, tobramycin vaccines Talk to your doctor or health care professional before taking any of thesemedicines: acetaminophen aspirin ibuprofen ketoprofen naproxen This list may not describe all possible interactions. Give your health care provider a list of all the medicines, herbs, non-prescription drugs, or dietary supplements you use. Also tell them if you smoke, drink alcohol, or use illegaldrugs. Some items may interact with your medicine. What should I watch for while using this medication? Your condition will be monitored carefully while you are receiving this medicine. You will need important blood work done while you are taking thismedicine. This drug may make you feel generally unwell. This is not uncommon, as chemotherapy can affect healthy cells as well as cancer cells. Report any side effects. Continue your course of treatment even though you feel ill unless yourdoctor tells you to stop. In some cases, you may be given additional medicines to help with side effects.Follow all directions for their use. Call your doctor or health care professional for advice if you get a fever, chills or sore throat, or other symptoms of a cold or flu. Do not treat yourself. This drug decreases your body's ability to fight infections. Try toavoid being around people who are  sick. This medicine may increase your risk to bruise or bleed. Call your doctor orhealth care professional if you notice any unusual bleeding. Be careful brushing and flossing your teeth  or using a toothpick because you may get an infection or bleed more easily. If you have any dental work done,tell your dentist you are receiving this medicine. Avoid taking products that contain aspirin, acetaminophen, ibuprofen, naproxen, or ketoprofen unless instructed by your doctor. These medicines may hide afever. Do not become pregnant while taking this medicine. Women should inform their doctor if they wish to become pregnant or think they might be pregnant. There is a potential for serious side effects to an unborn child. Talk to your health care professional or pharmacist for more information. Do not breast-feed aninfant while taking this medicine. What side effects may I notice from receiving this medication? Side effects that you should report to your doctor or health care professionalas soon as possible: allergic reactions like skin rash, itching or hives, swelling of the face, lips, or tongue signs of infection - fever or chills, cough, sore throat, pain or difficulty passing urine signs of decreased platelets or bleeding - bruising, pinpoint red spots on the skin, black, tarry stools, nosebleeds signs of decreased red blood cells - unusually weak or tired, fainting spells, lightheadedness breathing problems changes in hearing changes in vision chest pain high blood pressure low blood counts - This drug may decrease the number of white blood cells, red blood cells and platelets. You may be at increased risk for infections and bleeding. nausea and vomiting pain, swelling, redness or irritation at the injection site pain, tingling, numbness in the hands or feet problems with balance, talking, walking trouble passing urine or change in the amount of urine Side effects that usually do not require medical attention (report to yourdoctor or health care professional if they continue or are bothersome): hair loss loss of appetite metallic taste in the mouth or changes in taste This list  may not describe all possible side effects. Call your doctor for medical advice about side effects. You may report side effects to FDA at1-800-FDA-1088. Where should I keep my medication? This drug is given in a hospital or clinic and will not be stored at home. NOTE: This sheet is a summary. It may not cover all possible information. If you have questions about this medicine, talk to your doctor, pharmacist, orhealth care provider.  2022 [employer]/Gold Standard (2007-06-26 14:38:05) Paclitaxel injection What is this medication? PACLITAXEL (PAK li TAX el) is a chemotherapy drug. It targets fast dividing cells, like cancer cells, and causes these cells to die. This medicine is used to treat ovarian cancer, breast cancer, lung cancer, Kaposi's sarcoma, andother cancers. This medicine may be used for other purposes; ask your health care provider orpharmacist if you have questions. COMMON BRAND NAME(S): Onxol, Taxol What should I tell my care team before I take this medication? They need to know if you have any of these conditions: history of irregular heartbeat liver disease low blood counts, like low white cell, platelet, or red cell counts lung or breathing disease, like asthma tingling of the fingers or toes, or other nerve disorder an unusual or allergic reaction to paclitaxel, alcohol, polyoxyethylated castor oil, other chemotherapy, other medicines, foods, dyes, or preservatives pregnant or trying to get pregnant breast-feeding How should I use this medication? This drug is given as an infusion into a vein. It is administered in a hospitalor clinic by a specially trained health care  professional. Talk to your pediatrician regarding the use of this medicine in children.Special care may be needed. Overdosage: If you think you have taken too much of this medicine contact apoison control center or emergency room at once. NOTE: This medicine is only for you. Do not share this medicine with  others. What if I miss a dose? It is important not to miss your dose. Call your doctor or health careprofessional if you are unable to keep an appointment. What may interact with this medication? Do not take this medicine with any of the following medications: live virus vaccines This medicine may also interact with the following medications: antiviral medicines for hepatitis, HIV or AIDS certain antibiotics like erythromycin and clarithromycin certain medicines for fungal infections like ketoconazole and itraconazole certain medicines for seizures like carbamazepine, phenobarbital, phenytoin gemfibrozil nefazodone rifampin St. John's wort This list may not describe all possible interactions. Give your health care provider a list of all the medicines, herbs, non-prescription drugs, or dietary supplements you use. Also tell them if you smoke, drink alcohol, or use illegaldrugs. Some items may interact with your medicine. What should I watch for while using this medication? Your condition will be monitored carefully while you are receiving this medicine. You will need important blood work done while you are taking thismedicine. This medicine can cause serious allergic reactions. To reduce your risk you will need to take other medicine(s) before treatment with this medicine. If you experience allergic reactions like skin rash, itching or hives, swelling of theface, lips, or tongue, tell your doctor or health care professional right away. In some cases, you may be given additional medicines to help with side effects.Follow all directions for their use. This drug may make you feel generally unwell. This is not uncommon, as chemotherapy can affect healthy cells as well as cancer cells. Report any side effects. Continue your course of treatment even though you feel ill unless yourdoctor tells you to stop. Call your doctor or health care professional for advice if you get a fever, chills or sore throat,  or other symptoms of a cold or flu. Do not treat yourself. This drug decreases your body's ability to fight infections. Try toavoid being around people who are sick. This medicine may increase your risk to bruise or bleed. Call your doctor orhealth care professional if you notice any unusual bleeding. Be careful brushing and flossing your teeth or using a toothpick because you may get an infection or bleed more easily. If you have any dental work done,tell your dentist you are receiving this medicine. Avoid taking products that contain aspirin, acetaminophen, ibuprofen, naproxen, or ketoprofen unless instructed by your doctor. These medicines may hide afever. Do not become pregnant while taking this medicine. Women should inform their doctor if they wish to become pregnant or think they might be pregnant. There is a potential for serious side effects to an unborn child. Talk to your health care professional or pharmacist for more information. Do not breast-feed aninfant while taking this medicine. Men are advised not to father a child while receiving this medicine. This product may contain alcohol. Ask your pharmacist or healthcare provider if this medicine contains alcohol. Be sure to tell all healthcare providers you are taking this medicine. Certain medicines, like metronidazole and disulfiram, can cause an unpleasant reaction when taken with alcohol. The reaction includes flushing, headache, nausea, vomiting, sweating, and increased thirst. Thereaction can last from 30 minutes to several hours. What side effects may I notice from receiving  this medication? Side effects that you should report to your doctor or health care professionalas soon as possible: allergic reactions like skin rash, itching or hives, swelling of the face, lips, or tongue breathing problems changes in vision fast, irregular heartbeat high or low blood pressure mouth sores pain, tingling, numbness in the hands or feet signs of  decreased platelets or bleeding - bruising, pinpoint red spots on the skin, black, tarry stools, blood in the urine signs of decreased red blood cells - unusually weak or tired, feeling faint or lightheaded, falls signs of infection - fever or chills, cough, sore throat, pain or difficulty passing urine signs and symptoms of liver injury like dark yellow or brown urine; general ill feeling or flu-like symptoms; light-colored stools; loss of appetite; nausea; right upper belly pain; unusually weak or tired; yellowing of the eyes or skin swelling of the ankles, feet, hands unusually slow heartbeat Side effects that usually do not require medical attention (report to yourdoctor or health care professional if they continue or are bothersome): diarrhea hair loss loss of appetite muscle or joint pain nausea, vomiting pain, redness, or irritation at site where injected tiredness This list may not describe all possible side effects. Call your doctor for medical advice about side effects. You may report side effects to FDA at1-800-FDA-1088. Where should I keep my medication? This drug is given in a hospital or clinic and will not be stored at home. NOTE: This sheet is a summary. It may not cover all possible information. If you have questions about this medicine, talk to your doctor, pharmacist, orhealth care provider.  2022 [employer]vier/Gold Standard (2019-02-20 13:37:23)

## 2020-11-16 NOTE — Progress Notes (Signed)
Hematology and Oncology Follow Up Visit  Jonathan Lewis [id number] [birth year] 80 y.o. 11/16/2020   Principle Diagnosis:  Stage IIIA (T3N2M0) adenocarcinoma of the stomach --  S/p distal gastrectomy on 03/19/2019  -- HER2-/PD-L1+ -- metastatic   Current Therapy:    Carbo/Taxol/Keytruda -- start cycle #1 on 11/16/2020 -  XRT -- start on 09/18/2020 -- completed on 10/15/2020      Adjuvant FOLFOX - started on 05/13/2019, s/p cycle #8 -completed on Dec 11, 2019   Interim History:  Jonathan Lewis is here today for follow-up.  He does look a lot better.  I think he recently was in the hospital.  He was discharged on 11/03/2020.  He had "sepsis."  Is hard to say if he had cholecystitis or cholangitis.  He does have the biliary catheter and drain.  All of his cultures were negative.  He was placed on Maxipime and Flagyl.  He, again, is feeling better.  He is eating better.  He is having no problems with nausea or vomiting.  There is no diarrhea.  He has had no bleeding.  He did have a CT scan done today.  I do not have the results back yet.  We will try him on chemo with carboplatinum/Taxol.  I want to also add Keytruda since he has a high level of PD-L1.  I am not sure if his insurance company will allow this.  I would really hated if they did not.  I think the Keytruda would be helpful.  He does have the biliary drainage catheter.  The bile is a very light-colored and clear.  He has had no rashes.  There has been no leg swelling.  He has had no headache.  There is been no mouth sores.  Overall, I would have to say his performance status is probably ECOG 1-2.    Medications:  Allergies as of 11/16/2020   No Known Allergies      Medication List        Accurate as of November 16, 2020 11:10 AM. If you have any questions, ask your nurse or doctor.          atorvastatin 20 MG tablet Commonly known as: LIPITOR Take 20 mg by mouth daily.   dorzolamide-timolol 22.3-6.8 MG/ML ophthalmic  solution Commonly known as: COSOPT Place 1 drop into both eyes 2 (two) times daily.   famotidine 20 MG tablet Commonly known as: PEPCID TAKE 1 TABLET BY MOUTH EVERY DAY   latanoprost 0.005 % ophthalmic solution Commonly known as: XALATAN Place 1 drop into both eyes at bedtime.   lidocaine-prilocaine cream Commonly known as: EMLA Apply 1 application topically as needed. Place on the port one hour before appointment.   meclizine 12.5 MG tablet Commonly known as: ANTIVERT TAKE 1 TABLET BY MOUTH 3 TIMES DAILY AS NEEDED FOR DIZZINESS.   multivitamin with minerals Tabs tablet Take 1 tablet by mouth in the morning. Men's One-A-Day Chewable   ondansetron 8 MG tablet Commonly known as: ZOFRAN Take 1 tablet (8 mg total) by mouth every 8 (eight) hours as needed for nausea or vomiting.   senna-docusate 8.6-50 MG tablet Commonly known as: Senokot S Take 1 tablet by mouth daily.   Systane Ultra 0.4-0.3 % Soln Generic drug: Polyethyl Glycol-Propyl Glycol Place 1 drop into both eyes 3 (three) times daily as needed (dry eyes).        Allergies: No Known Allergies  Past Medical History, Surgical history, Social history, and Family History were reviewed  and updated.  Review of Systems: Review of Systems  Constitutional: Negative.   HENT: Negative.    Eyes: Negative.   Respiratory: Negative.    Cardiovascular: Negative.   Gastrointestinal:  Positive for abdominal pain.  Genitourinary: Negative.   Musculoskeletal: Negative.   Skin: Negative.   Neurological:  Positive for tingling.  Endo/Heme/Allergies: Negative.   Psychiatric/Behavioral: Negative.      Physical Exam:  weight is 157 lb (71.2 kg). His oral temperature is 98.6 F (37 C). His blood pressure is 93/76 and his pulse is 82. His respiration is 18.   Wt Readings from Last 3 Encounters:  11/16/20 157 lb (71.2 kg)  11/10/20 156 lb (70.8 kg)  10/31/20 160 lb (72.6 kg)    Physical Activity: Not on file     Physical Exam Vitals reviewed.  HENT:     Head: Normocephalic and atraumatic.  Eyes:     Pupils: Pupils are equal, round, and reactive to light.  Cardiovascular:     Rate and Rhythm: Normal rate and regular rhythm.     Heart sounds: Normal heart sounds.  Pulmonary:     Effort: Pulmonary effort is normal.     Breath sounds: Normal breath sounds.  Abdominal:     General: Bowel sounds are normal.     Palpations: Abdomen is soft.     Comments: Abdominal exam is soft.  He has well-healed laparotomy scars.  There is no tenderness to palpation.  He has no guarding or rebound tenderness.  There is no fluid wave.  There is no palpable liver or spleen tip.  Genitourinary:    Comments: On his rectal exam, he does have an external hemorrhoid.  This is not thrombosed or bleeding.  His rectal vault is smooth.  He has very little stool.  It is heme positive. Musculoskeletal:        General: No tenderness or deformity. Normal range of motion.     Cervical back: Normal range of motion.  Lymphadenopathy:     Cervical: No cervical adenopathy.  Skin:    General: Skin is warm and dry.     Findings: No erythema or rash.  Neurological:     Mental Status: He is alert and oriented to person, place, and time.  Psychiatric:        Behavior: Behavior normal.        Thought Content: Thought content normal.        Judgment: Judgment normal.    Lab Results  Component Value Date   WBC 4.3 11/16/2020   HGB 10.1 (L) 11/16/2020   HCT 30.7 (L) 11/16/2020   MCV 86.5 11/16/2020   PLT 218 11/16/2020   Lab Results  Component Value Date   FERRITIN 653 (H) 10/22/2020   IRON 27 (L) 10/22/2020   TIBC 241 (L) 10/22/2020   UIBC 214 10/22/2020   IRONPCTSAT 11 (L) 10/22/2020   Lab Results  Component Value Date   RETICCTPCT 1.6 09/04/2020   RBC 3.55 (L) 11/16/2020   No results found for: KPAFRELGTCHN, LAMBDASER, KAPLAMBRATIO No results found for: IGGSERUM, IGA, IGMSERUM No results found for:  TOTALPROTELP, ALBUMINELP, A1GS, A2GS, BETS, Carloyn Jaeger, MSPIKE, SPEI   Chemistry      Component Value Date/Time   NA 137 11/16/2020 0840   K 3.3 (L) 11/16/2020 0840   CL 105 11/16/2020 0840   CO2 25 11/16/2020 0840   BUN 7 (L) 11/16/2020 0840   CREATININE 0.59 (L) 11/16/2020 0840   CREATININE 1.06 07/16/2012 1038  Component Value Date/Time   CALCIUM 8.6 (L) 11/16/2020 0840   ALKPHOS 234 (H) 11/16/2020 0840   AST 43 (H) 11/16/2020 0840   ALT 21 11/16/2020 0840   BILITOT 0.7 11/16/2020 0840       Impression and Plan: Mr. Campion is a very pleasant 80 yo African American gentleman with stage IIIA (T3N2M0) adenocarcinoma of the stomach, HER2-/PD-L1+. He had a distal gastrectomy on 03/19/2019 and was treated with adjuvant  FOLFOX.   He developed metastatic disease.  He had bleeding when he tried him on Taxol/Cyramza.  I am sure that the Cyramza was probably the source of the bleeding.  He then had biliary obstruction from a periportal lymph node.  He then had radiation therapy for this.  The jaundice resolved.  His bilirubin normalized.  He is looking much better now.  We will go ahead with treatment.  Again, the carboplatinum/Taxol/Keytruda would be a very good protocol for him.  It will be interesting to see what the CT scan shows.  Hopefully, the radiation therapy to the periportal lymph node has helped decrease the lymph node significantly.  Again we are looking at quality of life.  This is our main goal.  I just want his quality of life to be as good as possible.  We will plan to have him come back in another 3 weeks.  We will get him back sooner if he has any problems.     Volanda Napoleon, MD 8/15/202211:10 AM

## 2020-11-16 NOTE — Patient Instructions (Signed)
Implanted Port Home Guide An implanted port is a device that is placed under the skin. It is usually placed in the chest. The device can be used to give IV medicine, to take blood, or for dialysis. You may have an implanted port if: You need IV medicine that would be irritating to the small veins in your hands or arms. You need IV medicines, such as antibiotics, for a long period of time. You need IV nutrition for a long period of time. You need dialysis. When you have a port, your health care provider can choose to use the port instead of veins in your arms for these procedures. You may have fewer limitations when using a port than you would if you used other types of long-term IVs, and you will likely be able to return to normal activities afteryour incision heals. An implanted port has two main parts: Reservoir. The reservoir is the part where a needle is inserted to give medicines or draw blood. The reservoir is round. After it is placed, it appears as a small, raised area under your skin. Catheter. The catheter is a thin, flexible tube that connects the reservoir to a vein. Medicine that is inserted into the reservoir goes into the catheter and then into the vein. How is my port accessed? To access your port: A numbing cream may be placed on the skin over the port site. Your health care provider will put on a mask and sterile gloves. The skin over your port will be cleaned carefully with a germ-killing soap and allowed to dry. Your health care provider will gently pinch the port and insert a needle into it. Your health care provider will check for a blood return to make sure the port is in the vein and is not clogged. If your port needs to remain accessed to get medicine continuously (constant infusion), your health care provider will place a clear bandage (dressing) over the needle site. The dressing and needle will need to be changed every week, or as told by your health care provider. What  is flushing? Flushing helps keep the port from getting clogged. Follow instructions from your health care provider about how and when to flush the port. Ports are usually flushed with saline solution or a medicine called heparin. The need for flushing will depend on how the port is used: If the port is only used from time to time to give medicines or draw blood, the port may need to be flushed: Before and after medicines have been given. Before and after blood has been drawn. As part of routine maintenance. Flushing may be recommended every 4-6 weeks. If a constant infusion is running, the port may not need to be flushed. Throw away any syringes in a disposal container that is meant for sharp items (sharps container). You can buy a sharps container from a pharmacy, or you can make one by using an empty hard plastic bottle with a cover. How long will my port stay implanted? The port can stay in for as long as your health care provider thinks it is needed. When it is time for the port to come out, a surgery will be done to remove it. The surgery will be similar to the procedure that was done to putthe port in. Follow these instructions at home:  Flush your port as told by your health care provider. If you need an infusion over several days, follow instructions from your health care provider about how to take   care of your port site. Make sure you: Wash your hands with soap and water before you change your dressing. If soap and water are not available, use alcohol-based hand sanitizer. Change your dressing as told by your health care provider. Place any used dressings or infusion bags into a plastic bag. Throw that bag in the trash. Keep the dressing that covers the needle clean and dry. Do not get it wet. Do not use scissors or sharp objects near the tube. Keep the tube clamped, unless it is being used. Check your port site every day for signs of infection. Check for: Redness, swelling, or  pain. Fluid or blood. Pus or a bad smell. Protect the skin around the port site. Avoid wearing bra straps that rub or irritate the site. Protect the skin around your port from seat belts. Place a soft pad over your chest if needed. Bathe or shower as told by your health care provider. The site may get wet as long as you are not actively receiving an infusion. Return to your normal activities as told by your health care provider. Ask your health care provider what activities are safe for you. Carry a medical alert card or wear a medical alert bracelet at all times. This will let health care providers know that you have an implanted port in case of an emergency. Get help right away if: You have redness, swelling, or pain at the port site. You have fluid or blood coming from your port site. You have pus or a bad smell coming from the port site. You have a fever. Summary Implanted ports are usually placed in the chest for long-term IV access. Follow instructions from your health care provider about flushing the port and changing bandages (dressings). Take care of the area around your port by avoiding clothing that puts pressure on the area, and by watching for signs of infection. Protect the skin around your port from seat belts. Place a soft pad over your chest if needed. Get help right away if you have a fever or you have redness, swelling, pain, drainage, or a bad smell at the port site. This information is not intended to replace advice given to you by your health care provider. Make sure you discuss any questions you have with your healthcare provider. Document Revised: 08/05/2019 Document Reviewed: 08/05/2019 Elsevier Patient Education  2022 [employer]  

## 2020-11-18 ENCOUNTER — Inpatient Hospital Stay: Payer: Medicare PPO

## 2020-11-18 ENCOUNTER — Other Ambulatory Visit: Payer: Self-pay

## 2020-11-18 VITALS — BP 110/77 | HR 67 | Temp 98.1°F | Resp 18

## 2020-11-18 DIAGNOSIS — C162 Malignant neoplasm of body of stomach: Secondary | ICD-10-CM

## 2020-11-18 DIAGNOSIS — Z79899 Other long term (current) drug therapy: Secondary | ICD-10-CM | POA: Diagnosis not present

## 2020-11-18 DIAGNOSIS — C772 Secondary and unspecified malignant neoplasm of intra-abdominal lymph nodes: Secondary | ICD-10-CM

## 2020-11-18 DIAGNOSIS — Z5111 Encounter for antineoplastic chemotherapy: Secondary | ICD-10-CM | POA: Diagnosis not present

## 2020-11-18 DIAGNOSIS — D508 Other iron deficiency anemias: Secondary | ICD-10-CM | POA: Diagnosis not present

## 2020-11-18 MED ORDER — PEGFILGRASTIM-CBQV 6 MG/0.6ML ~~LOC~~ SOSY
6.0000 mg | PREFILLED_SYRINGE | Freq: Once | SUBCUTANEOUS | Status: AC
  Administered 2020-11-18: 6 mg via SUBCUTANEOUS
  Filled 2020-11-18: qty 0.6

## 2020-11-18 NOTE — Progress Notes (Signed)
..  Patient is receiving Assistance Medication - Supplied Externally. Medication: Keytruda (pembrolizumab) Manufacture: [employer]  Approval Dates: Approved from 11/18/2020 until 04/03/2021. ID: [id number] Reason: Insurance Denial First DOS: 11/26/2020. Marland KitchenJuan Quam, CPhT IV Drug Replacement Specialist [place] Phone: [phone]

## 2020-11-18 NOTE — Patient Instructions (Signed)

## 2020-11-19 ENCOUNTER — Encounter: Payer: Self-pay | Admitting: Hematology & Oncology

## 2020-11-19 ENCOUNTER — Telehealth: Payer: Self-pay

## 2020-11-21 ENCOUNTER — Emergency Department (HOSPITAL_COMMUNITY): Payer: Medicare PPO

## 2020-11-21 ENCOUNTER — Other Ambulatory Visit: Payer: Self-pay

## 2020-11-21 ENCOUNTER — Encounter (HOSPITAL_COMMUNITY): Payer: Self-pay

## 2020-11-21 ENCOUNTER — Emergency Department (HOSPITAL_COMMUNITY)
Admission: EM | Admit: 2020-11-21 | Discharge: 2020-11-21 | Disposition: A | Discharge status: Home / Self Care | Payer: Medicare PPO | Attending: Emergency Medicine | Admitting: Emergency Medicine

## 2020-11-21 DIAGNOSIS — Z87891 Personal history of nicotine dependence: Secondary | ICD-10-CM | POA: Insufficient documentation

## 2020-11-21 DIAGNOSIS — Z452 Encounter for adjustment and management of vascular access device: Secondary | ICD-10-CM | POA: Diagnosis not present

## 2020-11-21 DIAGNOSIS — Z85028 Personal history of other malignant neoplasm of stomach: Secondary | ICD-10-CM | POA: Insufficient documentation

## 2020-11-21 DIAGNOSIS — Z4801 Encounter for change or removal of surgical wound dressing: Secondary | ICD-10-CM | POA: Diagnosis not present

## 2020-11-21 DIAGNOSIS — K4090 Unilateral inguinal hernia, without obstruction or gangrene, not specified as recurrent: Secondary | ICD-10-CM | POA: Diagnosis not present

## 2020-11-21 DIAGNOSIS — K7689 Other specified diseases of liver: Secondary | ICD-10-CM | POA: Diagnosis not present

## 2020-11-21 DIAGNOSIS — Z4800 Encounter for change or removal of nonsurgical wound dressing: Secondary | ICD-10-CM | POA: Diagnosis not present

## 2020-11-21 DIAGNOSIS — I110 Hypertensive heart disease with heart failure: Secondary | ICD-10-CM | POA: Diagnosis not present

## 2020-11-21 DIAGNOSIS — T85698A Other mechanical complication of other specified internal prosthetic devices, implants and grafts, initial encounter: Secondary | ICD-10-CM | POA: Diagnosis not present

## 2020-11-21 DIAGNOSIS — I5042 Chronic combined systolic (congestive) and diastolic (congestive) heart failure: Secondary | ICD-10-CM | POA: Diagnosis not present

## 2020-11-21 DIAGNOSIS — Y658 Other specified misadventures during surgical and medical care: Secondary | ICD-10-CM | POA: Diagnosis not present

## 2020-11-21 DIAGNOSIS — Z5189 Encounter for other specified aftercare: Secondary | ICD-10-CM

## 2020-11-21 DIAGNOSIS — E1140 Type 2 diabetes mellitus with diabetic neuropathy, unspecified: Secondary | ICD-10-CM | POA: Diagnosis not present

## 2020-11-21 LAB — CBC WITH DIFFERENTIAL/PLATELET
Abs Immature Granulocytes: 0.2 10*3/uL — ABNORMAL HIGH (ref 0.00–0.07)
Band Neutrophils: 1 %
Basophils Absolute: 0 10*3/uL (ref 0.0–0.1)
Basophils Relative: 0 %
Eosinophils Absolute: 0 10*3/uL (ref 0.0–0.5)
Eosinophils Relative: 0 %
HCT: 32.9 % — ABNORMAL LOW (ref 39.0–52.0)
Hemoglobin: 10.6 g/dL — ABNORMAL LOW (ref 13.0–17.0)
Lymphocytes Relative: 6 %
Lymphs Abs: 0.3 10*3/uL — ABNORMAL LOW (ref 0.7–4.0)
MCH: 28.4 pg (ref 26.0–34.0)
MCHC: 32.2 g/dL (ref 30.0–36.0)
MCV: 88.2 fL (ref 80.0–100.0)
Metamyelocytes Relative: 3 %
Monocytes Absolute: 0 10*3/uL — ABNORMAL LOW (ref 0.1–1.0)
Monocytes Relative: 0 %
Neutro Abs: 4.6 10*3/uL (ref 1.7–7.7)
Neutrophils Relative %: 90 %
Platelets: 121 10*3/uL — ABNORMAL LOW (ref 150–400)
RBC: 3.73 MIL/uL — ABNORMAL LOW (ref 4.22–5.81)
RDW: 15.3 % (ref 11.5–15.5)
WBC: 5.1 10*3/uL (ref 4.0–10.5)
nRBC: 0 % (ref 0.0–0.2)

## 2020-11-21 LAB — LACTIC ACID, PLASMA: Lactic Acid, Venous: 1.1 mmol/L (ref 0.5–1.9)

## 2020-11-21 LAB — COMPREHENSIVE METABOLIC PANEL
ALT: 35 U/L (ref 0–44)
AST: 33 U/L (ref 15–41)
Albumin: 2.8 g/dL — ABNORMAL LOW (ref 3.5–5.0)
Alkaline Phosphatase: 180 U/L — ABNORMAL HIGH (ref 38–126)
Anion gap: 6 (ref 5–15)
BUN: 17 mg/dL (ref 8–23)
CO2: 26 mmol/L (ref 22–32)
Calcium: 8.2 mg/dL — ABNORMAL LOW (ref 8.9–10.3)
Chloride: 103 mmol/L (ref 98–111)
Creatinine, Ser: 0.53 mg/dL — ABNORMAL LOW (ref 0.61–1.24)
GFR, Estimated: >60 mL/min (ref >60)
Glucose, Bld: 86 mg/dL (ref 70–99)
Potassium: 4 mmol/L (ref 3.5–5.1)
Sodium: 135 mmol/L (ref 135–145)
Total Bilirubin: 1.3 mg/dL — ABNORMAL HIGH (ref 0.3–1.2)
Total Protein: 6.4 g/dL — ABNORMAL LOW (ref 6.5–8.1)

## 2020-11-21 LAB — LIPASE, BLOOD: Lipase: 36 U/L (ref 11–51)

## 2020-11-21 MED ORDER — SODIUM CHLORIDE 0.9 % IV SOLN: Freq: Once | INTRAVENOUS | Status: DC

## 2020-11-21 MED ORDER — IOHEXOL 350 MG/ML SOLN
80.0000 mL | Freq: Once | INTRAVENOUS | Status: AC | PRN
  Administered 2020-11-21: 80 mL via INTRAVENOUS

## 2020-11-21 MED ORDER — SODIUM CHLORIDE 0.9 % IV BOLUS
500.0000 mL | Freq: Once | INTRAVENOUS | Status: AC
  Administered 2020-11-21: 500 mL via INTRAVENOUS

## 2020-11-21 MED ORDER — HEPARIN SOD (PORK) LOCK FLUSH 100 UNIT/ML IV SOLN
500.0000 [IU] | Freq: Once | INTRAVENOUS | Status: AC
  Administered 2020-11-21: 500 [IU]
  Filled 2020-11-21: qty 5

## 2020-11-21 NOTE — ED Notes (Signed)
Unable to obtain second set of cultures, attempted x2.

## 2020-11-21 NOTE — ED Provider Notes (Signed)
[facility] DEPT Provider Note   CSN: [id number] Arrival date & time: 11/21/20  1046     History Drainage from biliary drain   Jonathan Lewis is a 80 y.o. male with medical history significant for stomach cancer, recent admission for sepsis, s/p biliary drains placed due to stone infection who presents for evaluation of drain complications.  He has biliary and cholecystectomy tube.  This has been present for months.  He states when he woke up this morning he noted some dried liquids to his tubing from drain.  States he has been feeling overall well.  He last saw oncology 5 days ago.  He denies fever, chills, nausea, vomiting, chest pain, shortness of breath, abdominal pain, diarrhea, dysuria.  Has not had any change in the amount of his drains.  Denies additional aggravating or alleviating factors.  States he has appointment on Monday for assessment of his drains with IR.  Denies any purulent drainage from drains.  No bleeding.  Denies additional aggravating or alleviating factors.  Does state that his family had the discharge was pulled out approximately half inch from where it "typically is."  History obtained from patient, family in room and past medical records.  No interpreter used.  HPI     Past Medical History:  Diagnosis Date   Acoustic neuroma (Jonathan Lewis) 06/25/2015   Allergic rhinitis 07/18/2014   Allergy    Anemia    Arthritis    BPH (benign prostatic hyperplasia)    Cancer (Jonathan Lewis)    stage III stomach-Dx 03/2019   Carbuncle    recurrent MRSA carbuncles   Cataract    Per pt bilateral cataracts removed.   Diabetes mellitus    Type II. Per pt Dr. Jenny Reichmann took him off his dm med.   Disk prolapse    Glaucoma    Hyperlipidemia    Hypertension    Male hypogonadism 07/16/2014   Neuropathy    Pneumonia    Sinus bradycardia    chronic, asymptomatic   Sinusitis 07/29/2012    Patient Active Problem List   Diagnosis Date Noted   Gait disorder  11/10/2020   Normocytic anemia 11/01/2020   Chronic combined systolic and diastolic CHF (congestive heart failure) ([place]) 11/01/2020   Hyponatremia 11/01/2020   Metastatic malignant neoplasm ([name])    Sepsis (Jonathan Lewis) 09/21/2020   Malignant neoplasm metastatic to intra-abdominal lymph node ([name]) 09/16/2020   Biliary obstruction 09/09/2020   Right knee pain 04/22/2020   Myelopathy ([place]) 01/08/2020   Iron deficiency anemia 06/04/2019   Abnormal PET scan, mediastinum 05/29/2019   Mediastinal adenopathy 05/22/2019   Goals of care, counseling/discussion 04/29/2019   Gastric cancer pT3pN2 s/p distal gastrectomy with Billroth II gastrojejunostomy 03/19/2019 03/24/2019   H/O exploratory laparotomy 03/19/2019   Malnutrition of moderate degree 03/08/2019   Gastric outlet obstruction 03/07/2019   Weight loss 03/05/2019   Nausea and vomiting 03/05/2019   Decreased appetite 03/05/2019   Epigastric pain 12/05/2018   Hyperglycemia 12/05/2018   DDD (degenerative disc disease), cervical 04/16/2018   Spinal stenosis in cervical region 04/16/2018   Lumbar radiculopathy 01/02/2018   Leg pain, bilateral 11/08/2017   Bursitis of right hip 05/10/2017   Left shoulder pain 05/10/2017   Physical deconditioning 05/10/2017   Wheezing 12/13/2016   Cough 11/03/2016   Neck pain on left side 11/03/2016   Bilateral leg paresthesia 11/10/2015   Sensorineural hearing loss (SNHL) of both ears 07/27/2015   Tinnitus of both ears 07/27/2015   Left acoustic neuroma (  [name]) 06/25/2015   Hematochezia 05/13/2015   Abnormal finding on MRI of brain 05/13/2015   Vertigo 05/05/2015   GERD (gastroesophageal reflux disease) 05/05/2015   BPH (benign prostatic hyperplasia) 05/05/2015   Bradycardia    Chest pain 07/18/2014   Allergic rhinitis 07/18/2014   Pain of right thumb 07/18/2014   Male hypogonadism 07/16/2014   Encounter for well adult exam with abnormal findings 07/17/2013   Right sided sciatica 07/17/2013    Constipation 01/21/2012   Leukopenia 06/11/2011   HEARING LOSS 04/10/2009   GROIN PAIN 01/26/2009   Osteoarthritis, hand 10/27/2008   LOW BACK PAIN, CHRONIC 10/27/2008   Diet-controlled diabetes mellitus ([name]) 07/17/2008   Hyperlipidemia 06/06/2007   EXTERNAL HEMORRHOIDS 02/15/2007   Benign nodular prostatic hyperplasia 02/15/2007   ERECTILE DYSFUNCTION 10/31/2006   Hypertension complicating diabetes ([name]) 10/31/2006    Past Surgical History:  Procedure Laterality Date   ANTERIOR CERVICAL DECOMPRESSION/DISCECTOMY FUSION 4 LEVELS N/A 01/08/2020   Procedure: ANTERIOR CERVICAL DECOMPRESSION FUSION CERVICAL 3-4, CERVICAL 4-5, CERVIAL 5-6 WITH INSTRUMENTATION AND ALLOGRAFT;  Surgeon: Phylliss Bob, MD;  Location: [facility];  Service: Orthopedics;  Laterality: N/A;   BALLOON DILATION N/A 03/12/2019   Procedure: BALLOON DILATION;  Surgeon: Rush Landmark Telford Nab., MD;  Location: [facility];  Service: Gastroenterology;  Laterality: N/A;  pyloric   BIOPSY  03/08/2019   Procedure: BIOPSY;  Surgeon: Yetta Flock, MD;  Location: [facility];  Service: Gastroenterology;;   BIOPSY  03/12/2019   Procedure: BIOPSY;  Surgeon: Irving Copas., MD;  Location: [facility];  Service: Gastroenterology;;   CATARACT EXTRACTION     x 2   COLONOSCOPY     ENDOSCOPIC RETROGRADE CHOLANGIOPANCREATOGRAPHY (ERCP) WITH PROPOFOL N/A 09/09/2020   Procedure: ENDOSCOPIC RETROGRADE CHOLANGIOPANCREATOGRAPHY (ERCP) WITH PROPOFOL;  Surgeon: Irving Copas., MD;  Location: [facility];  Service: Gastroenterology;  Laterality: N/A;   ESOPHAGOGASTRODUODENOSCOPY N/A 03/08/2019   Procedure: ESOPHAGOGASTRODUODENOSCOPY (EGD);  Surgeon: Yetta Flock, MD;  Location: [facility];  Service: Gastroenterology;  Laterality: N/A;   ESOPHAGOGASTRODUODENOSCOPY (EGD) WITH PROPOFOL N/A 03/12/2019   Procedure: ESOPHAGOGASTRODUODENOSCOPY (EGD) WITH PROPOFOL;  Surgeon: Rush Landmark Telford Nab., MD;  [facility];  Service: Gastroenterology;  Laterality: N/A;   FINE NEEDLE ASPIRATION  03/12/2019   Procedure: FINE NEEDLE ASPIRATION (FNA) LINEAR;  Surgeon: Irving Copas., MD;  Location: [facility];  Service: Gastroenterology;;   FOREIGN BODY REMOVAL  03/08/2019   Procedure: FOREIGN BODY REMOVAL;  Surgeon: Yetta Flock, MD;  Location[facility];  Service: Gastroenterology;;   IR BILIARY DRAIN PLACEMENT WITH CHOLANGIOGRAM  09/09/2020   IR CHOLANGIOGRAM EXISTING TUBE  09/29/2020   IR CHOLANGIOGRAM EXISTING TUBE  09/29/2020   IR CHOLANGIOGRAM EXISTING TUBE  11/13/2020   IR CHOLANGIOGRAM EXISTING TUBE  11/13/2020   IR EXCHANGE BILIARY DRAIN  09/24/2020   IR EXCHANGE BILIARY DRAIN  10/30/2020   IR EXCHANGE BILIARY DRAIN  10/30/2020   IR INT EXT BILIARY DRAIN WITH CHOLANGIOGRAM  09/10/2020   IR PATIENT EVAL TECH 0-60 MINS  04/03/2019   IR PERC CHOLECYSTOSTOMY  09/24/2020   LAPAROTOMY N/A 03/19/2019   Procedure: EXPLORATORY LAPAROTOMY, distal GASTRECTOMY AND PLACEMENT OF G AND J TUBE, gastric jejunostomy;  Surgeon: Greer Pickerel, MD;  Location: [facility];  Service: General;  Laterality: N/A;   POLYPECTOMY     PORTACATH PLACEMENT N/A 03/26/2019   Procedure: INSERTION PORT-A-CATH;  Surgeon: Michael Boston, MD;  Location: [facility];  Service: General;  Laterality: N/A;   STOMACH SURGERY     SUBMUCOSAL TATTOO  INJECTION  09/09/2020   Procedure: SUBMUCOSAL TATTOO INJECTION;  Surgeon: Irving Copas., MD;  Location: [facility];  Service: Gastroenterology;;   UPPER ESOPHAGEAL ENDOSCOPIC ULTRASOUND (EUS) N/A 03/12/2019   Procedure: UPPER ESOPHAGEAL ENDOSCOPIC ULTRASOUND (EUS);  Surgeon: Irving Copas., MD;  Location: [facility];  Service: Gastroenterology;  Laterality: N/A;   UPPER GASTROINTESTINAL ENDOSCOPY     VIDEO BRONCHOSCOPY WITH ENDOBRONCHIAL ULTRASOUND N/A 05/22/2019   Procedure: VIDEO BRONCHOSCOPY WITH ENDOBRONCHIAL ULTRASOUND;  Surgeon: Garner Nash, DO;  Location: [facility];   Service: Thoracic;  Laterality: N/A;       Family History  Problem Relation Age of Onset   Cancer Mother    Hypertension Mother    Diabetes Sister    Cancer Brother        colon   Hypertension Brother    Hyperlipidemia Brother    Stroke Brother    Hypertension Sister    Colon cancer Neg Hx    Heart attack Neg Hx    Sudden death Neg Hx    Esophageal cancer Neg Hx    Pancreatic cancer Neg Hx    Stomach cancer Neg Hx    Rectal cancer Neg Hx     Social History   Tobacco Use   Smoking status: Former    Packs/day: 1.00    Years: 24.00    Pack years: 24.00    Types: Cigarettes    Quit date: 10/03/1978    Years since quitting: 42.1   Smokeless tobacco: Never  Vaping Use   Vaping Use: Never used  Substance Use Topics   Alcohol use: Not Currently   Drug use: No    Home Medications Prior to Admission medications   Medication Sig Start Date End Date Taking? Authorizing Provider  atorvastatin (LIPITOR) 20 MG tablet Take 20 mg by mouth daily. 10/30/20  Yes [provider]  dorzolamide-timolol (COSOPT) 22.3-6.8 MG/ML ophthalmic solution Place 1 drop into both eyes 2 (two) times daily. 06/15/20  Yes [provider]  famotidine (PEPCID) 20 MG tablet TAKE 1 TABLET BY MOUTH EVERY DAY Patient taking differently: Take 20 mg by mouth daily as needed for heartburn or indigestion. 09/10/20  Yes Ennever, Rudell Cobb, MD  latanoprost (XALATAN) 0.005 % ophthalmic solution Place 1 drop into both eyes at bedtime.  01/09/19  Yes [provider]  lidocaine-prilocaine (EMLA) cream Apply 1 application topically as needed. Place on the port one hour before appointment. 08/24/20  Yes Volanda Napoleon, MD  meclizine (ANTIVERT) 12.5 MG tablet TAKE 1 TABLET BY MOUTH 3 TIMES DAILY AS NEEDED FOR DIZZINESS. Patient taking differently: Take 12.5 mg by mouth 3 (three) times daily as needed for dizziness or nausea. 08/10/20  Yes Biagio Borg, MD  Multiple Vitamin (MULTIVITAMIN WITH MINERALS)  TABS tablet Take 1 tablet by mouth in the morning. Men's One-A-Day Chewable   Yes [provider]  ondansetron (ZOFRAN) 8 MG tablet Take 1 tablet (8 mg total) by mouth every 8 (eight) hours as needed for nausea or vomiting. 09/16/20  Yes Hayden Pedro, PA-C  senna-docusate (SENOKOT S) 8.6-50 MG tablet Take 1 tablet by mouth daily. 09/12/20  Yes Domenic Polite, MD  SYSTANE ULTRA 0.4-0.3 % SOLN Place 1 drop into both eyes 3 (three) times daily as needed (dry eyes). 01/09/19  Yes [provider]  dexamethasone (DECADRON) 4 MG tablet Take 2 tablets (8 mg total) by mouth daily. Start the day after carboplatin chemotherapy for 3 days. 11/16/20   Burney Gauze  R, MD    Allergies    Patient has no known allergies.  Review of Systems   Review of Systems  Constitutional: Negative.   HENT: Negative.    Respiratory: Negative.    Cardiovascular: Negative.   Gastrointestinal: Negative.   Genitourinary: Negative.   Musculoskeletal: Negative.   Skin: Negative.   Neurological: Negative.   All other systems reviewed and are negative.  Physical Exam Updated Vital Signs BP 106/62   Pulse 76   Temp 97.6 F (36.4 C) (Oral)   Resp 18   SpO2 100%   Physical Exam Vitals and nursing note reviewed.  Constitutional:      General: He is not in acute distress.    Appearance: He is well-developed. He is not ill-appearing (Chronically ill appearing), toxic-appearing or diaphoretic.  HENT:     Head: Normocephalic and atraumatic.     Nose: Nose normal.     Mouth/Throat:     Mouth: Mucous membranes are moist.  Eyes:     Pupils: Pupils are equal, round, and reactive to light.  Cardiovascular:     Rate and Rhythm: Normal rate and regular rhythm.     Pulses: Normal pulses.     Heart sounds: Normal heart sounds.  Pulmonary:     Effort: Pulmonary effort is normal. No respiratory distress.     Breath sounds: Normal breath sounds.  Abdominal:     General: Bowel sounds are normal.  There is no distension.     Palpations: Abdomen is soft. There is no mass.     Tenderness: There is no abdominal tenderness. There is no right CVA tenderness, left CVA tenderness, guarding or rebound.     Hernia: No hernia is present.       Comments: Biliary and chole drain in place. Old dried green drainage to midline tube.  No purulent drainage from either site.  No surrounding erythema or warmth.  Musculoskeletal:        General: Normal range of motion.     Cervical back: Normal range of motion and neck supple.     Comments: No bony tenderness. Moves all 4 extremities   Skin:    General: Skin is warm and dry.     Capillary Refill: Capillary refill takes less than 2 seconds.     Comments: No edema, erythema or warmth.  No fluctuance induration  Neurological:     General: No focal deficit present.     Mental Status: He is alert and oriented to person, place, and time.       ED Results / Procedures / Treatments   Labs (all labs ordered are listed, but only abnormal results are displayed) Labs Reviewed  CBC WITH DIFFERENTIAL/PLATELET - Abnormal; Notable for the following components:      Result Value   RBC 3.73 (*)    Hemoglobin 10.6 (*)    HCT 32.9 (*)    Platelets 121 (*)    Lymphs Abs 0.3 (*)    Monocytes Absolute 0.0 (*)    Abs Immature Granulocytes 0.20 (*)    All other components within normal limits  COMPREHENSIVE METABOLIC PANEL - Abnormal; Notable for the following components:   Creatinine, Ser 0.53 (*)    Calcium 8.2 (*)    Total Protein 6.4 (*)    Albumin 2.8 (*)    Alkaline Phosphatase 180 (*)    Total Bilirubin 1.3 (*)    All other components within normal limits  CULTURE, BLOOD (ROUTINE X 2)  CULTURE,  BLOOD (ROUTINE X 2)  LIPASE, BLOOD  LACTIC ACID, PLASMA    EKG None  Radiology CT Abdomen Pelvis W Contrast  Result Date: 11/21/2020 CLINICAL DATA:  History of percutaneous biliary drainage and percutaneous cholecystostomy tube placement with  drainage around catheters by report. This 80 year old male also with history of gastric cancer. EXAM: CT ABDOMEN AND PELVIS WITH CONTRAST TECHNIQUE: Multidetector CT imaging of the abdomen and pelvis was performed using the standard protocol following bolus administration of intravenous contrast. CONTRAST:  33mL OMNIPAQUE IOHEXOL 350 MG/ML SOLN COMPARISON:  CT evaluation of November 16, 2020. FINDINGS: Lower chest: Central venous access device terminating in the caval to atrial junction. Hepatobiliary: Unchanged appearance of percutaneous biliary drainage catheter entering via LEFT-sided approach. Marker for sideholes at the level of the inter lobar fissure within liver parenchyma. Percutaneous cholecystostomy tube remains in place. Peripheral biliary ductal dilation extending to hepatic subsegment VIII is unchanged. Mild stranding about the porta hepatis also likewise unchanged. Portal vein is patent. Pancreas: Normal, without mass, inflammation or ductal dilatation. Spleen: Spleen normal size and contour. Adrenals/Urinary Tract: Adrenal glands are normal. Nephrolithiasis of the RIGHT kidney is unchanged. No hydronephrosis. No perinephric stranding. Urinary bladder with smooth contours. No perivesical stranding. Stomach/Bowel: Post gastrojejunostomy. No signs of bowel obstruction. Hepatic flexure and proximal and mid transverse colon are collapsed. Query mild thickening versus collapse. The appendix is normal. Resolution of bowel distension in the RIGHT upper quadrant. Vascular/Lymphatic: Calcified and noncalcified atheromatous plaque of the abdominal aorta. There is no gastrohepatic or hepatoduodenal ligament lymphadenopathy. No retroperitoneal or mesenteric lymphadenopathy. No pelvic sidewall lymphadenopathy. Reproductive: Unremarkable by CT. Other: Small fat containing LEFT inguinal hernia. Small ventral hernia at the umbilicus containing fat. No free air in the abdomen. No ascites. Peritoneal nodularity along the  LEFT flank, largest approximately 16 mm as on the very recent comparison evaluation. Also with peritoneal nodularity in the RIGHT upper quadrant with similar appearance. Musculoskeletal: No acute musculoskeletal process. Spinal degenerative changes. IMPRESSION: 1. Unchanged appearance of percutaneous biliary drainage catheter and percutaneous cholecystostomy tube. Mild stranding along the anterior margin of the LEFT hepatic lobe is again noted without focal fluid collection. 2. Unchanged segmental biliary duct distension in the RIGHT hepatic lobe. Lesion at the RIGHT portal pedicle remains a concern though no discrete lesion is visible. 3. Collapse versus mild thickening of the hepatic flexure and proximal and mid transverse colon. Correlate with any symptoms of colitis. 4. Unchanged signs of peritoneal disease. 5. Query mild thickening versus collapse of the colon. Correlate with any clinical signs of colitis. 6. Aortic atherosclerosis. Aortic Atherosclerosis (ICD10-I70.0). Electronically Signed   By: Zetta Bills M.D.   On: 11/21/2020 14:27    Procedures Procedures   Medications Ordered in ED Medications  0.9 %  sodium chloride infusion (has no administration in time range)  iohexol (OMNIPAQUE) 350 MG/ML injection 80 mL (80 mLs Intravenous Contrast Given 11/21/20 1352)  sodium chloride 0.9 % bolus 500 mL (0 mLs Intravenous Stopped 11/21/20 1521)  heparin lock flush 100 unit/mL (500 Units Intracatheter Given 11/21/20 1532)    ED Course  I have reviewed the triage vital signs and the nursing notes.  Pertinent labs & imaging results that were available during my care of the patient were reviewed by me and considered in my medical decision making (see chart for details).  Here for evaluation of some drainage to his midline drain.  He has extensive history eating biliary and chole drain in place from prior choledocholithiasis s/p sepsis.  He has no systemic symptoms.  Apparently woke up after turning  over in bed this morning and noted some drainage to his midline drain.  No purulent drainage.  Has some old crusting to midline drain however no active purulent discharge.  No surrounding erythema or warmth.  No fluctuance or induration.  Abdomen soft, nontender.  Heart and lungs clear.  Did have a few soft pressures however at baseline per chart review patient has systolic into the 96E.  We will plan on labs, imaging and reassess  Labs and imaging personally reviewed and interpreted: CBC without leukocytosis Metabolic panel with alk phos 180, improved from prior minor elevation bili 1.8 however improved from previous Lactic acid 1.1 Lipase 36 CT abdomen and pelvis does not show any evidence of discrete fluid collections.  No evidence of abscess.  Tubes in place.  Patient reassessed.  He is got a small fluid bolus and a systolic greater than 454.  He has reassuring labs.  Area does not appear infected.  Both drains are draining without any significant complications.  Question if patient had tugged at his drain however he has no leaking currently.  He states he has appointment with IR on Monday for replacement.  Encourage close follow-up.  Will return for new or worsening symptoms.  The patient has been appropriately medically screened and/or stabilized in the ED. I have low suspicion for any other emergent medical condition which would require further screening, evaluation or treatment in the ED or require inpatient management.  Patient is hemodynamically stable and in no acute distress.  Patient able to ambulate in department prior to ED.  Evaluation does not show acute pathology that would require ongoing or additional emergent interventions while in the emergency department or further inpatient treatment.  I have discussed the diagnosis with the patient and answered all questions.  Pain is been managed while in the emergency department and patient has no further complaints prior to discharge.   Patient is comfortable with plan discussed in room and is stable for discharge at this time.  I have discussed strict return precautions for returning to the emergency department.  Patient was encouraged to follow-up with PCP/specialist refer to at discharge.   Discussed with attending, Dr. Armandina Gemma who agrees with above treatment, plan and disposition.    MDM Rules/Calculators/A&P                            Final Clinical Impression(s) / ED Diagnoses Final diagnoses:  Visit for wound check    Rx / DC Orders ED Discharge Orders     None        Dajiah Kooi A, PA-C 11/21/20 1606    Regan Lemming, MD 11/22/20 1136

## 2020-11-21 NOTE — ED Triage Notes (Signed)
Pt arrived via POV, c/o leakage from drains x2 that he woke with. Hypotensive in triage. Denies any weakness, dizziness, or any other sx.

## 2020-11-21 NOTE — Discharge Instructions (Addendum)
Keep your appointment with interventional radiologist for recheck of your drain  If you notice you have had more drainage please seek reevaluation

## 2020-11-22 ENCOUNTER — Other Ambulatory Visit: Payer: Self-pay | Admitting: Internal Medicine

## 2020-11-22 DIAGNOSIS — R5381 Other malaise: Secondary | ICD-10-CM

## 2020-11-22 DIAGNOSIS — I5042 Chronic combined systolic (congestive) and diastolic (congestive) heart failure: Secondary | ICD-10-CM

## 2020-11-22 DIAGNOSIS — G629 Polyneuropathy, unspecified: Secondary | ICD-10-CM | POA: Insufficient documentation

## 2020-11-22 DIAGNOSIS — C772 Secondary and unspecified malignant neoplasm of intra-abdominal lymph nodes: Secondary | ICD-10-CM

## 2020-11-22 DIAGNOSIS — M5441 Lumbago with sciatica, right side: Secondary | ICD-10-CM

## 2020-11-22 DIAGNOSIS — G8929 Other chronic pain: Secondary | ICD-10-CM

## 2020-11-22 DIAGNOSIS — M4802 Spinal stenosis, cervical region: Secondary | ICD-10-CM

## 2020-11-22 DIAGNOSIS — R269 Unspecified abnormalities of gait and mobility: Secondary | ICD-10-CM

## 2020-11-23 ENCOUNTER — Encounter: Payer: Self-pay | Admitting: Gastroenterology

## 2020-11-23 ENCOUNTER — Ambulatory Visit
Admission: RE | Admit: 2020-11-23 | Discharge: 2020-11-23 | Disposition: A | Discharge status: Home / Self Care | Payer: Medicare PPO | Source: Ambulatory Visit | Attending: Hematology & Oncology | Admitting: Hematology & Oncology

## 2020-11-23 DIAGNOSIS — C162 Malignant neoplasm of body of stomach: Secondary | ICD-10-CM

## 2020-11-23 NOTE — Progress Notes (Signed)
  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 During treatment he did very well with radiotherapy and did not have significant desquamation. He did have constipation and nausea. He was seen over the weekend in the ED for biliary drain triage with drainage he noted at home. He's scheduled for exchange on 12/25/20 with IR.  Impression/Plan: 1. 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 I was unable to reach the patient but left a voicemail and on the message, I discussed that we would be happy to continue to follow him as needed, but he will also continue to follow up with Dr. Marin Olp in medical oncology.      Carola Rhine, PAC

## 2020-11-23 NOTE — Progress Notes (Signed)
Pt comes in today 8/22 for check of biliary drain which has been leaking from the sin site. We last saw pt 8/12 for drain checks as pt has both Biliary drain and perc chole. Drains at that time were fine.  Pt came to ER this past weekend with c/o drain leakage. CT and labs performed. Imaging showed drains in appropriate position. T. Bili 1.3  Pt reports biliary drain leakage mostly occurs at night when he's laying down. Has not been flushing but has reconnected to gravity bag, which does have bilious output.  Drain sites assessed. Chole tube intact, site dry, scant output in bag.  Bili drain site with some bilious drainage coming from skin site. Tube appears intact. Flushes easily with return of bile and some debris.  Discussed with pt that the leakage sounds positional and perhaps could be some intermittent debris that temporarily clogs tube, which allows for leakage from skin. Encouraged the pt to resume flushing at least once daily with 5 mL NS (flushes provided)  Also discussed that since he has had tube for a few months now, intermittent may just be an issue to deal with, as frequent and unnecessary exchanges are not likely to fix the problem.  He will keep [place] updated if these measures seem to help, but will otherwise follow up with his regular scheduled exchange next month.  Ascencion Dike PA-C Interventional Radiology 11/23/2020 11:00 AM

## 2020-11-26 ENCOUNTER — Inpatient Hospital Stay: Payer: Medicare PPO

## 2020-11-26 ENCOUNTER — Inpatient Hospital Stay: Payer: Medicare PPO | Admitting: Hematology & Oncology

## 2020-11-26 LAB — CULTURE, BLOOD (ROUTINE X 2)
Culture: NO GROWTH
Special Requests: ADEQUATE

## 2020-11-27 ENCOUNTER — Inpatient Hospital Stay: Payer: Medicare PPO

## 2020-12-03 ENCOUNTER — Inpatient Hospital Stay: Payer: Medicare PPO

## 2020-12-03 ENCOUNTER — Inpatient Hospital Stay: Payer: Medicare PPO | Attending: Hematology & Oncology

## 2020-12-03 ENCOUNTER — Other Ambulatory Visit: Payer: Self-pay

## 2020-12-03 ENCOUNTER — Inpatient Hospital Stay (HOSPITAL_BASED_OUTPATIENT_CLINIC_OR_DEPARTMENT_OTHER): Payer: Medicare PPO | Admitting: Hematology & Oncology

## 2020-12-03 ENCOUNTER — Telehealth: Payer: Self-pay

## 2020-12-03 ENCOUNTER — Encounter: Payer: Self-pay | Admitting: Hematology & Oncology

## 2020-12-03 VITALS — BP 102/65 | HR 85 | Temp 97.8°F | Resp 18 | Wt 150.0 lb

## 2020-12-03 DIAGNOSIS — Z9221 Personal history of antineoplastic chemotherapy: Secondary | ICD-10-CM | POA: Diagnosis not present

## 2020-12-03 DIAGNOSIS — Z5111 Encounter for antineoplastic chemotherapy: Secondary | ICD-10-CM | POA: Insufficient documentation

## 2020-12-03 DIAGNOSIS — Z66 Do not resuscitate: Secondary | ICD-10-CM | POA: Diagnosis not present

## 2020-12-03 DIAGNOSIS — Z903 Acquired absence of stomach [part of]: Secondary | ICD-10-CM | POA: Insufficient documentation

## 2020-12-03 DIAGNOSIS — R634 Abnormal weight loss: Secondary | ICD-10-CM | POA: Diagnosis not present

## 2020-12-03 DIAGNOSIS — D509 Iron deficiency anemia, unspecified: Secondary | ICD-10-CM

## 2020-12-03 DIAGNOSIS — C772 Secondary and unspecified malignant neoplasm of intra-abdominal lymph nodes: Secondary | ICD-10-CM

## 2020-12-03 DIAGNOSIS — Z923 Personal history of irradiation: Secondary | ICD-10-CM | POA: Insufficient documentation

## 2020-12-03 DIAGNOSIS — Z5112 Encounter for antineoplastic immunotherapy: Secondary | ICD-10-CM | POA: Diagnosis not present

## 2020-12-03 DIAGNOSIS — C162 Malignant neoplasm of body of stomach: Secondary | ICD-10-CM

## 2020-12-03 DIAGNOSIS — D500 Iron deficiency anemia secondary to blood loss (chronic): Secondary | ICD-10-CM

## 2020-12-03 DIAGNOSIS — R300 Dysuria: Secondary | ICD-10-CM

## 2020-12-03 DIAGNOSIS — C161 Malignant neoplasm of fundus of stomach: Secondary | ICD-10-CM

## 2020-12-03 DIAGNOSIS — K921 Melena: Secondary | ICD-10-CM | POA: Diagnosis not present

## 2020-12-03 DIAGNOSIS — C169 Malignant neoplasm of stomach, unspecified: Secondary | ICD-10-CM | POA: Diagnosis not present

## 2020-12-03 DIAGNOSIS — C160 Malignant neoplasm of cardia: Secondary | ICD-10-CM

## 2020-12-03 DIAGNOSIS — R1010 Upper abdominal pain, unspecified: Secondary | ICD-10-CM | POA: Diagnosis not present

## 2020-12-03 DIAGNOSIS — E440 Moderate protein-calorie malnutrition: Secondary | ICD-10-CM

## 2020-12-03 DIAGNOSIS — K644 Residual hemorrhoidal skin tags: Secondary | ICD-10-CM | POA: Insufficient documentation

## 2020-12-03 LAB — LACTATE DEHYDROGENASE: LDH: 134 U/L (ref 98–192)

## 2020-12-03 LAB — CBC WITH DIFFERENTIAL (CANCER CENTER ONLY)
Abs Immature Granulocytes: 0.04 10*3/uL (ref 0.00–0.07)
Basophils Absolute: 0 10*3/uL (ref 0.0–0.1)
Basophils Relative: 1 %
Eosinophils Absolute: 0 10*3/uL (ref 0.0–0.5)
Eosinophils Relative: 0 %
HCT: 29.9 % — ABNORMAL LOW (ref 39.0–52.0)
Hemoglobin: 9.9 g/dL — ABNORMAL LOW (ref 13.0–17.0)
Immature Granulocytes: 1 %
Lymphocytes Relative: 14 %
Lymphs Abs: 0.8 10*3/uL (ref 0.7–4.0)
MCH: 28.4 pg (ref 26.0–34.0)
MCHC: 33.1 g/dL (ref 30.0–36.0)
MCV: 85.7 fL (ref 80.0–100.0)
Monocytes Absolute: 0.6 10*3/uL (ref 0.1–1.0)
Monocytes Relative: 11 %
Neutro Abs: 4 10*3/uL (ref 1.7–7.7)
Neutrophils Relative %: 73 %
Platelet Count: 172 10*3/uL (ref 150–400)
RBC: 3.49 MIL/uL — ABNORMAL LOW (ref 4.22–5.81)
RDW: 14.5 % (ref 11.5–15.5)
WBC Count: 5.5 10*3/uL (ref 4.0–10.5)
nRBC: 0 % (ref 0.0–0.2)

## 2020-12-03 LAB — URINALYSIS, COMPLETE (UACMP) WITH MICROSCOPIC
Bilirubin Urine: NEGATIVE
Glucose, UA: NEGATIVE mg/dL
Hgb urine dipstick: NEGATIVE
Ketones, ur: NEGATIVE mg/dL
Leukocytes,Ua: NEGATIVE
Nitrite: NEGATIVE
Protein, ur: NEGATIVE mg/dL
Specific Gravity, Urine: 1.03 (ref 1.005–1.030)
pH: 6 (ref 5.0–8.0)

## 2020-12-03 LAB — IRON AND TIBC
Iron: 51 ug/dL (ref 42–163)
Saturation Ratios: 27 % (ref 20–55)
TIBC: 185 ug/dL — ABNORMAL LOW (ref 202–409)
UIBC: 135 ug/dL (ref 117–376)

## 2020-12-03 LAB — CMP (CANCER CENTER ONLY)
ALT: 19 U/L (ref 0–44)
AST: 25 U/L (ref 15–41)
Albumin: 2.9 g/dL — ABNORMAL LOW (ref 3.5–5.0)
Alkaline Phosphatase: 248 U/L — ABNORMAL HIGH (ref 38–126)
Anion gap: 7 (ref 5–15)
BUN: 11 mg/dL (ref 8–23)
CO2: 25 mmol/L (ref 22–32)
Calcium: 8.4 mg/dL — ABNORMAL LOW (ref 8.9–10.3)
Chloride: 104 mmol/L (ref 98–111)
Creatinine: 0.55 mg/dL — ABNORMAL LOW (ref 0.61–1.24)
GFR, Estimated: >60 mL/min (ref >60)
Glucose, Bld: 147 mg/dL — ABNORMAL HIGH (ref 70–99)
Potassium: 2.9 mmol/L — ABNORMAL LOW (ref 3.5–5.1)
Sodium: 136 mmol/L (ref 135–145)
Total Bilirubin: 0.8 mg/dL (ref 0.3–1.2)
Total Protein: 6.2 g/dL — ABNORMAL LOW (ref 6.5–8.1)

## 2020-12-03 LAB — SAMPLE TO BLOOD BANK

## 2020-12-03 LAB — FERRITIN: Ferritin: 1033 ng/mL — ABNORMAL HIGH (ref 24–336)

## 2020-12-03 LAB — TSH: TSH: 2.128 u[IU]/mL (ref 0.320–4.118)

## 2020-12-03 MED ORDER — SODIUM CHLORIDE 0.9 % IV SOLN
400.0000 mg | Freq: Once | INTRAVENOUS | Status: AC
  Administered 2020-12-03: 400 mg via INTRAVENOUS
  Filled 2020-12-03: qty 40

## 2020-12-03 MED ORDER — SODIUM CHLORIDE 0.9 % IV SOLN
200.0000 mg | Freq: Once | INTRAVENOUS | Status: AC
  Administered 2020-12-03: 200 mg via INTRAVENOUS
  Filled 2020-12-03: qty 200

## 2020-12-03 MED ORDER — SODIUM CHLORIDE 0.9 % IV SOLN: Freq: Once | INTRAVENOUS | Status: DC

## 2020-12-03 MED ORDER — DIPHENHYDRAMINE HCL 50 MG/ML IJ SOLN
50.0000 mg | Freq: Once | INTRAMUSCULAR | Status: AC
  Administered 2020-12-03: 50 mg via INTRAVENOUS
  Filled 2020-12-03: qty 1

## 2020-12-03 MED ORDER — FAMOTIDINE 20 MG IN NS 100 ML IVPB
20.0000 mg | Freq: Two times a day (BID) | INTRAVENOUS | Status: DC
  Administered 2020-12-03: 20 mg via INTRAVENOUS
  Filled 2020-12-03: qty 20

## 2020-12-03 MED ORDER — SODIUM CHLORIDE 0.9 % IV SOLN
200.0000 mg | Freq: Once | INTRAVENOUS | Status: AC
  Administered 2020-12-03: 200 mg via INTRAVENOUS
  Filled 2020-12-03: qty 8

## 2020-12-03 MED ORDER — POTASSIUM CHLORIDE CRYS ER 20 MEQ PO TBCR: 40.0000 meq | EXTENDED_RELEASE_TABLET | Freq: Every day | ORAL | 6 refills | Status: DC

## 2020-12-03 MED ORDER — SODIUM CHLORIDE 0.9 % IV SOLN
160.0000 mg/m2 | Freq: Once | INTRAVENOUS | Status: AC
  Administered 2020-12-03: 300 mg via INTRAVENOUS
  Filled 2020-12-03: qty 50

## 2020-12-03 MED ORDER — SODIUM CHLORIDE 0.9% FLUSH
10.0000 mL | INTRAVENOUS | Status: DC | PRN
Start: 2020-12-03 — End: 2020-12-03
  Administered 2020-12-03: 10 mL

## 2020-12-03 MED ORDER — SODIUM CHLORIDE 0.9 % IV SOLN: Freq: Once | INTRAVENOUS | Status: AC

## 2020-12-03 MED ORDER — PALONOSETRON HCL INJECTION 0.25 MG/5ML
0.2500 mg | Freq: Once | INTRAVENOUS | Status: AC
  Administered 2020-12-03: 0.25 mg via INTRAVENOUS
  Filled 2020-12-03: qty 5

## 2020-12-03 MED ORDER — POTASSIUM CHLORIDE CRYS ER 20 MEQ PO TBCR
40.0000 meq | EXTENDED_RELEASE_TABLET | Freq: Three times a day (TID) | ORAL | Status: DC
  Administered 2020-12-03 (×2): 40 meq via ORAL

## 2020-12-03 MED ORDER — SODIUM CHLORIDE 0.9 % IV SOLN
10.0000 mg | Freq: Once | INTRAVENOUS | Status: AC
  Administered 2020-12-03: 10 mg via INTRAVENOUS
  Filled 2020-12-03: qty 10

## 2020-12-03 MED ORDER — HEPARIN SOD (PORK) LOCK FLUSH 100 UNIT/ML IV SOLN
500.0000 [IU] | Freq: Once | INTRAVENOUS | Status: AC | PRN
  Administered 2020-12-03: 500 [IU]

## 2020-12-03 MED ORDER — SODIUM CHLORIDE 0.9 % IV SOLN
150.0000 mg | Freq: Once | INTRAVENOUS | Status: AC
  Administered 2020-12-03: 150 mg via INTRAVENOUS
  Filled 2020-12-03: qty 150

## 2020-12-03 NOTE — Patient Instructions (Signed)
[facility] [place]  Discharge Instructions: Thank you for choosing [facility] to provide your oncology and hematology care.   If you have a lab appointment with the [facility], please go directly to the [facility] and check in at the registration area.  Wear comfortable clothing and clothing appropriate for easy access to any Portacath or PICC line.   We strive to give you quality time with your provider. You may need to reschedule your appointment if you arrive late (15 or more minutes).  Arriving late affects you and other patients whose appointments are after yours.  Also, if you miss three or more appointments without notifying the office, you may be dismissed from the clinic at the provider's discretion.      For prescription refill requests, have your pharmacy contact our office and allow 72 hours for refills to be completed.    Today you received the following chemotherapy and/or immunotherapy agents taxol, Jonathan Lewis, keytruda, iron     To help prevent nausea and vomiting after your treatment, we encourage you to take your nausea medication as directed.  BELOW ARE SYMPTOMS THAT SHOULD BE REPORTED IMMEDIATELY: *FEVER GREATER THAN 100.4 F (38 C) OR HIGHER *CHILLS OR SWEATING *NAUSEA AND VOMITING THAT IS NOT CONTROLLED WITH YOUR NAUSEA MEDICATION *UNUSUAL SHORTNESS OF BREATH *UNUSUAL BRUISING OR BLEEDING *URINARY PROBLEMS (pain or burning when urinating, or frequent urination) *BOWEL PROBLEMS (unusual diarrhea, constipation, pain near the anus) TENDERNESS IN MOUTH AND THROAT WITH OR WITHOUT PRESENCE OF ULCERS (sore throat, sores in mouth, or a toothache) UNUSUAL RASH, SWELLING OR PAIN  UNUSUAL VAGINAL DISCHARGE OR ITCHING   Items with * indicate a potential emergency and should be followed up as soon as possible or go to the Emergency Department if any problems should occur.  Please show the CHEMOTHERAPY ALERT CARD or IMMUNOTHERAPY ALERT CARD at  check-in to the Emergency Department and triage nurse. Should you have questions after your visit or need to cancel or reschedule your appointment, please contact [facility]  [phone] and follow the prompts.  Office hours are 8:00 a.m. to 4:30 p.m. Monday - Friday. Please note that voicemails left after 4:00 p.m. may not be returned until the following business day.  We are closed weekends and major holidays. You have access to a nurse at all times for urgent questions. Please call the main number to the clinic [phone] and follow the prompts.  For any non-urgent questions, you may also contact your provider using MyChart. We now offer e-Visits for anyone 33 and older to request care online for non-urgent symptoms. For details visit [email or web address].   Also download the MyChart app! Go to the app store, search "MyChart", open the app, select [facility], and log in with your MyChart username and password.  Due to Covid, a mask is required upon entering the hospital/clinic. If you do not have a mask, one will be given to you upon arrival. For doctor visits, patients may have 1 support person aged 1 or older with them. For treatment visits, patients cannot have anyone with them due to current Covid guidelines and our immunocompromised population.

## 2020-12-03 NOTE — Progress Notes (Signed)
Hematology and Oncology Follow Up Visit  Jonathan Lewis [id number] 1940-05-24 80 y.o. 12/03/2020   Principle Diagnosis:  Stage IIIA (T3N2M0) adenocarcinoma of the stomach --  S/p distal gastrectomy on 03/19/2019  -- HER2-/PD-L1+ -- metastatic   Current Therapy:    Carbo/Taxol/Keytruda -- start cycle #1 on 11/16/2020 -  XRT -- start on 09/18/2020 -- completed on 10/15/2020      Adjuvant FOLFOX - started on 05/13/2019, s/p cycle #8 -completed on Dec 11, 2019   Interim History:  Jonathan Lewis is here today for follow-up.  The problem that we have is his weight loss.  He is down to 150 pounds now.  I really hate the fact that he is losing weight.  He is eating.  His albumin is coming up.  Again, I just worry about the cancer being the cause of the weight loss.  He still has a external biliary drain bag.  His bilirubin is down to 0.8.  He is having some abdominal discomfort.  This is in the upper abdomen.  This certainly could be from his drainage tube.  He has had no fever.  He had a hard time with the Neulasta.  I will hold the Neulasta this cycle.  He has had no bleeding.  He said he does have a little bit of blood in the stool.  I think we will need some IV iron today.  Overall, I think he tolerated the actual chemotherapy pretty well.  He has had no headache.  He has had no leg swelling.  There is been no rashes.  He has had no cough.  He has had no swollen lymph nodes.  Overall, his performance status is ECOG 2.    Medications:  Allergies as of 12/03/2020   No Known Allergies      Medication List        Accurate as of December 03, 2020  9:58 AM. If you have any questions, ask your nurse or doctor.          atorvastatin 20 MG tablet Commonly known as: LIPITOR Take 20 mg by mouth daily.   dexamethasone 4 MG tablet Commonly known as: DECADRON Take 2 tablets (8 mg total) by mouth daily. Start the day after carboplatin chemotherapy for 3 days.   dorzolamide-timolol  22.3-6.8 MG/ML ophthalmic solution Commonly known as: COSOPT Place 1 drop into both eyes 2 (two) times daily.   famotidine 20 MG tablet Commonly known as: PEPCID TAKE 1 TABLET BY MOUTH EVERY DAY What changed:  when to take this reasons to take this   latanoprost 0.005 % ophthalmic solution Commonly known as: XALATAN Place 1 drop into both eyes at bedtime.   lidocaine-prilocaine cream Commonly known as: EMLA Apply 1 application topically as needed. Place on the port one hour before appointment.   meclizine 12.5 MG tablet Commonly known as: ANTIVERT TAKE 1 TABLET BY MOUTH 3 TIMES DAILY AS NEEDED FOR DIZZINESS.   multivitamin with minerals Tabs tablet Take 1 tablet by mouth in the morning. Men's One-A-Day Chewable   ondansetron 8 MG tablet Commonly known as: ZOFRAN Take 1 tablet (8 mg total) by mouth every 8 (eight) hours as needed for nausea or vomiting.   senna-docusate 8.6-50 MG tablet Commonly known as: Senokot S Take 1 tablet by mouth daily.   Systane Ultra 0.4-0.3 % Soln Generic drug: Polyethyl Glycol-Propyl Glycol Place 1 drop into both eyes 3 (three) times daily as needed (dry eyes).  Allergies: No Known Allergies  Past Medical History, Surgical history, Social history, and Family History were reviewed and updated.  Review of Systems: Review of Systems  Constitutional: Negative.   HENT: Negative.    Eyes: Negative.   Respiratory: Negative.    Cardiovascular: Negative.   Gastrointestinal:  Positive for abdominal pain.  Genitourinary: Negative.   Musculoskeletal: Negative.   Skin: Negative.   Neurological:  Positive for tingling.  Endo/Heme/Allergies: Negative.   Psychiatric/Behavioral: Negative.      Physical Exam:  weight is 150 lb (68 kg). His oral temperature is 97.8 F (36.6 C). His blood pressure is 102/65 and his pulse is 85. His respiration is 18 and oxygen saturation is 100%.   Wt Readings from Last 3 Encounters:  12/03/20 150 lb  (68 kg)  11/16/20 157 lb (71.2 kg)  11/10/20 156 lb (70.8 kg)    Physical Activity: Not on file    Physical Exam Vitals reviewed.  HENT:     Head: Normocephalic and atraumatic.  Eyes:     Pupils: Pupils are equal, round, and reactive to light.  Cardiovascular:     Rate and Rhythm: Normal rate and regular rhythm.     Heart sounds: Normal heart sounds.  Pulmonary:     Effort: Pulmonary effort is normal.     Breath sounds: Normal breath sounds.  Abdominal:     General: Bowel sounds are normal.     Palpations: Abdomen is soft.     Comments: Abdominal exam is soft.  He has well-healed laparotomy scars.  There is no tenderness to palpation.  He has no guarding or rebound tenderness.  There is no fluid wave.  There is no palpable liver or spleen tip.  Genitourinary:    Comments: On his rectal exam, he does have an external hemorrhoid.  This is not thrombosed or bleeding.  His rectal vault is smooth.  He has very little stool.  It is heme positive. Musculoskeletal:        General: No tenderness or deformity. Normal range of motion.     Cervical back: Normal range of motion.  Lymphadenopathy:     Cervical: No cervical adenopathy.  Skin:    General: Skin is warm and dry.     Findings: No erythema or rash.  Neurological:     Mental Status: He is alert and oriented to person, place, and time.  Psychiatric:        Behavior: Behavior normal.        Thought Content: Thought content normal.        Judgment: Judgment normal.    Lab Results  Component Value Date   WBC 5.5 12/03/2020   HGB 9.9 (L) 12/03/2020   HCT 29.9 (L) 12/03/2020   MCV 85.7 12/03/2020   PLT 172 12/03/2020   Lab Results  Component Value Date   FERRITIN 653 (H) 10/22/2020   IRON 27 (L) 10/22/2020   TIBC 241 (L) 10/22/2020   UIBC 214 10/22/2020   IRONPCTSAT 11 (L) 10/22/2020   Lab Results  Component Value Date   RETICCTPCT 1.6 09/04/2020   RBC 3.49 (L) 12/03/2020   No results found for: KPAFRELGTCHN,  LAMBDASER, KAPLAMBRATIO No results found for: IGGSERUM, IGA, IGMSERUM No results found for: TOTALPROTELP, ALBUMINELP, A1GS, A2GS, BETS, BETA2SER, GAMS, MSPIKE, SPEI   Chemistry      Component Value Date/Time   NA 136 12/03/2020 0847   K 2.9 (L) 12/03/2020 0847   CL 104 12/03/2020 0847   CO2 25  12/03/2020 0847   BUN 11 12/03/2020 0847   CREATININE 0.55 (L) 12/03/2020 0847   CREATININE 1.06 07/16/2012 1038      Component Value Date/Time   CALCIUM 8.4 (L) 12/03/2020 0847   ALKPHOS 248 (H) 12/03/2020 0847   AST 25 12/03/2020 0847   ALT 19 12/03/2020 0847   BILITOT 0.8 12/03/2020 0847       Impression and Plan: Mr. Mader is a very pleasant 80 yo African American gentleman with stage IIIA (T3N2M0) adenocarcinoma of the stomach, HER2-/PD-L1+. He had a distal gastrectomy on 03/19/2019 and was treated with adjuvant  FOLFOX.   He developed metastatic disease.  He had bleeding when he tried him on Taxol/Cyramza.  I am sure that the Cyramza was probably the source of the bleeding.  He then had biliary obstruction from a periportal lymph node.  He then had radiation therapy for this.  The jaundice resolved.  His bilirubin normalized.  It is hard to say if he is responding to treatment.  I do not want to give up yet.  Again the weight loss really bothers me.  Hopefully, he will start gaining a little bit weight.  His albumin is going up a little bit.  We will give him some potassium in the office.  We will give him some iron today.  We will plan to get him back in 3 weeks.  We will see how he is looking then.  I will do a third cycle of treatment and then we can rescan after that.      Volanda Napoleon, MD 9/1/20229:58 AM

## 2020-12-03 NOTE — Patient Instructions (Signed)
Implanted Port Home Guide An implanted port is a device that is placed under the skin. It is usually placed in the chest. The device can be used to give IV medicine, to take blood, or for dialysis. You may have an implanted port if: You need IV medicine that would be irritating to the small veins in your hands or arms. You need IV medicines, such as antibiotics, for a long period of time. You need IV nutrition for a long period of time. You need dialysis. When you have a port, your health care provider can choose to use the port instead of veins in your arms for these procedures. You may have fewer limitations when using a port than you would if you used other types of long-term IVs, and you will likely be able to return to normal activities after your incision heals. An implanted port has two main parts: Reservoir. The reservoir is the part where a needle is inserted to give medicines or draw blood. The reservoir is round. After it is placed, it appears as a small, raised area under your skin. Catheter. The catheter is a thin, flexible tube that connects the reservoir to a vein. Medicine that is inserted into the reservoir goes into the catheter and then into the vein. How is my port accessed? To access your port: A numbing cream may be placed on the skin over the port site. Your health care provider will put on a mask and sterile gloves. The skin over your port will be cleaned carefully with a germ-killing soap and allowed to dry. Your health care provider will gently pinch the port and insert a needle into it. Your health care provider will check for a blood return to make sure the port is in the vein and is not clogged. If your port needs to remain accessed to get medicine continuously (constant infusion), your health care provider will place a clear bandage (dressing) over the needle site. The dressing and needle will need to be changed every week, or as told by your health care provider. What  is flushing? Flushing helps keep the port from getting clogged. Follow instructions from your health care provider about how and when to flush the port. Ports are usually flushed with saline solution or a medicine called heparin. The need for flushing will depend on how the port is used: If the port is only used from time to time to give medicines or draw blood, the port may need to be flushed: Before and after medicines have been given. Before and after blood has been drawn. As part of routine maintenance. Flushing may be recommended every 4-6 weeks. If a constant infusion is running, the port may not need to be flushed. Throw away any syringes in a disposal container that is meant for sharp items (sharps container). You can buy a sharps container from a pharmacy, or you can make one by using an empty hard plastic bottle with a cover. How long will my port stay implanted? The port can stay in for as long as your health care provider thinks it is needed. When it is time for the port to come out, a surgery will be done to remove it. The surgery will be similar to the procedure that was done to put the port in. Follow these instructions at home:  Flush your port as told by your health care provider. If you need an infusion over several days, follow instructions from your health care provider about how   to take care of your port site. Make sure you: Wash your hands with soap and water before you change your dressing. If soap and water are not available, use alcohol-based hand sanitizer. Change your dressing as told by your health care provider. Place any used dressings or infusion bags into a plastic bag. Throw that bag in the trash. Keep the dressing that covers the needle clean and dry. Do not get it wet. Do not use scissors or sharp objects near the tube. Keep the tube clamped, unless it is being used. Check your port site every day for signs of infection. Check for: Redness, swelling, or  pain. Fluid or blood. Pus or a bad smell. Protect the skin around the port site. Avoid wearing bra straps that rub or irritate the site. Protect the skin around your port from seat belts. Place a soft pad over your chest if needed. Bathe or shower as told by your health care provider. The site may get wet as long as you are not actively receiving an infusion. Return to your normal activities as told by your health care provider. Ask your health care provider what activities are safe for you. Carry a medical alert card or wear a medical alert bracelet at all times. This will let health care providers know that you have an implanted port in case of an emergency. Get help right away if: You have redness, swelling, or pain at the port site. You have fluid or blood coming from your port site. You have pus or a bad smell coming from the port site. You have a fever. Summary Implanted ports are usually placed in the chest for long-term IV access. Follow instructions from your health care provider about flushing the port and changing bandages (dressings). Take care of the area around your port by avoiding clothing that puts pressure on the area, and by watching for signs of infection. Protect the skin around your port from seat belts. Place a soft pad over your chest if needed. Get help right away if you have a fever or you have redness, swelling, pain, drainage, or a bad smell at the port site. This information is not intended to replace advice given to you by your health care provider. Make sure you discuss any questions you have with your health care provider. Document Revised: 06/10/2020 Document Reviewed: 08/05/2019 Elsevier Patient Education  2022 [employer]  

## 2020-12-03 NOTE — Telephone Encounter (Signed)
Appts made per 12/03/20 los, pt to gain sch at [facility] and through [facility]

## 2020-12-04 ENCOUNTER — Inpatient Hospital Stay: Payer: Medicare PPO

## 2020-12-04 LAB — T4: T4, Total: 9.4 ug/dL (ref 4.5–12.0)

## 2020-12-08 ENCOUNTER — Emergency Department (HOSPITAL_COMMUNITY)
Admission: EM | Admit: 2020-12-08 | Discharge: 2020-12-08 | Disposition: A | Discharge status: Home / Self Care | Payer: Medicare PPO | Source: Home / Self Care | Attending: Emergency Medicine | Admitting: Emergency Medicine

## 2020-12-08 ENCOUNTER — Emergency Department (HOSPITAL_COMMUNITY): Payer: Medicare PPO

## 2020-12-08 ENCOUNTER — Other Ambulatory Visit: Payer: Self-pay

## 2020-12-08 ENCOUNTER — Other Ambulatory Visit: Payer: Self-pay | Admitting: Hematology & Oncology

## 2020-12-08 ENCOUNTER — Encounter: Payer: Self-pay | Admitting: Hematology & Oncology

## 2020-12-08 ENCOUNTER — Encounter (HOSPITAL_COMMUNITY): Payer: Self-pay | Admitting: Emergency Medicine

## 2020-12-08 ENCOUNTER — Ambulatory Visit: Payer: Medicare PPO | Admitting: Internal Medicine

## 2020-12-08 DIAGNOSIS — I5042 Chronic combined systolic (congestive) and diastolic (congestive) heart failure: Secondary | ICD-10-CM | POA: Insufficient documentation

## 2020-12-08 DIAGNOSIS — Z8249 Family history of ischemic heart disease and other diseases of the circulatory system: Secondary | ICD-10-CM | POA: Diagnosis not present

## 2020-12-08 DIAGNOSIS — I110 Hypertensive heart disease with heart failure: Secondary | ICD-10-CM | POA: Insufficient documentation

## 2020-12-08 DIAGNOSIS — R109 Unspecified abdominal pain: Secondary | ICD-10-CM | POA: Insufficient documentation

## 2020-12-08 DIAGNOSIS — K6289 Other specified diseases of anus and rectum: Secondary | ICD-10-CM | POA: Diagnosis not present

## 2020-12-08 DIAGNOSIS — K7689 Other specified diseases of liver: Secondary | ICD-10-CM | POA: Diagnosis not present

## 2020-12-08 DIAGNOSIS — K769 Liver disease, unspecified: Secondary | ICD-10-CM | POA: Diagnosis not present

## 2020-12-08 DIAGNOSIS — A419 Sepsis, unspecified organism: Secondary | ICD-10-CM | POA: Diagnosis not present

## 2020-12-08 DIAGNOSIS — K5900 Constipation, unspecified: Secondary | ICD-10-CM | POA: Insufficient documentation

## 2020-12-08 DIAGNOSIS — Z903 Acquired absence of stomach [part of]: Secondary | ICD-10-CM | POA: Diagnosis not present

## 2020-12-08 DIAGNOSIS — N200 Calculus of kidney: Secondary | ICD-10-CM | POA: Diagnosis not present

## 2020-12-08 DIAGNOSIS — E876 Hypokalemia: Secondary | ICD-10-CM | POA: Diagnosis present

## 2020-12-08 DIAGNOSIS — Z83438 Family history of other disorder of lipoprotein metabolism and other lipidemia: Secondary | ICD-10-CM | POA: Diagnosis not present

## 2020-12-08 DIAGNOSIS — K838 Other specified diseases of biliary tract: Secondary | ICD-10-CM | POA: Diagnosis not present

## 2020-12-08 DIAGNOSIS — Z87891 Personal history of nicotine dependence: Secondary | ICD-10-CM | POA: Insufficient documentation

## 2020-12-08 DIAGNOSIS — Z515 Encounter for palliative care: Secondary | ICD-10-CM | POA: Diagnosis not present

## 2020-12-08 DIAGNOSIS — T451X5A Adverse effect of antineoplastic and immunosuppressive drugs, initial encounter: Secondary | ICD-10-CM | POA: Diagnosis present

## 2020-12-08 DIAGNOSIS — Z823 Family history of stroke: Secondary | ICD-10-CM | POA: Diagnosis not present

## 2020-12-08 DIAGNOSIS — Z8579 Personal history of other malignant neoplasms of lymphoid, hematopoietic and related tissues: Secondary | ICD-10-CM | POA: Insufficient documentation

## 2020-12-08 DIAGNOSIS — E875 Hyperkalemia: Secondary | ICD-10-CM | POA: Diagnosis not present

## 2020-12-08 DIAGNOSIS — K831 Obstruction of bile duct: Secondary | ICD-10-CM | POA: Diagnosis present

## 2020-12-08 DIAGNOSIS — C786 Secondary malignant neoplasm of retroperitoneum and peritoneum: Secondary | ICD-10-CM | POA: Diagnosis present

## 2020-12-08 DIAGNOSIS — K819 Cholecystitis, unspecified: Secondary | ICD-10-CM | POA: Diagnosis present

## 2020-12-08 DIAGNOSIS — D630 Anemia in neoplastic disease: Secondary | ICD-10-CM | POA: Diagnosis present

## 2020-12-08 DIAGNOSIS — Z66 Do not resuscitate: Secondary | ICD-10-CM | POA: Diagnosis present

## 2020-12-08 DIAGNOSIS — Z923 Personal history of irradiation: Secondary | ICD-10-CM | POA: Diagnosis not present

## 2020-12-08 DIAGNOSIS — Z85028 Personal history of other malignant neoplasm of stomach: Secondary | ICD-10-CM | POA: Insufficient documentation

## 2020-12-08 DIAGNOSIS — R634 Abnormal weight loss: Secondary | ICD-10-CM | POA: Diagnosis present

## 2020-12-08 DIAGNOSIS — N400 Enlarged prostate without lower urinary tract symptoms: Secondary | ICD-10-CM | POA: Diagnosis not present

## 2020-12-08 DIAGNOSIS — K661 Hemoperitoneum: Secondary | ICD-10-CM | POA: Diagnosis not present

## 2020-12-08 DIAGNOSIS — R5081 Fever presenting with conditions classified elsewhere: Secondary | ICD-10-CM | POA: Diagnosis present

## 2020-12-08 DIAGNOSIS — D61818 Other pancytopenia: Secondary | ICD-10-CM | POA: Diagnosis present

## 2020-12-08 DIAGNOSIS — E1140 Type 2 diabetes mellitus with diabetic neuropathy, unspecified: Secondary | ICD-10-CM | POA: Insufficient documentation

## 2020-12-08 DIAGNOSIS — D61810 Antineoplastic chemotherapy induced pancytopenia: Secondary | ICD-10-CM | POA: Diagnosis present

## 2020-12-08 DIAGNOSIS — C169 Malignant neoplasm of stomach, unspecified: Secondary | ICD-10-CM | POA: Diagnosis not present

## 2020-12-08 DIAGNOSIS — R748 Abnormal levels of other serum enzymes: Secondary | ICD-10-CM | POA: Diagnosis present

## 2020-12-08 DIAGNOSIS — Z20822 Contact with and (suspected) exposure to covid-19: Secondary | ICD-10-CM | POA: Diagnosis present

## 2020-12-08 DIAGNOSIS — E871 Hypo-osmolality and hyponatremia: Secondary | ICD-10-CM | POA: Diagnosis present

## 2020-12-08 DIAGNOSIS — D709 Neutropenia, unspecified: Secondary | ICD-10-CM | POA: Diagnosis not present

## 2020-12-08 DIAGNOSIS — Z9889 Other specified postprocedural states: Secondary | ICD-10-CM | POA: Diagnosis not present

## 2020-12-08 DIAGNOSIS — C161 Malignant neoplasm of fundus of stomach: Secondary | ICD-10-CM | POA: Diagnosis not present

## 2020-12-08 DIAGNOSIS — Z800 Family history of malignant neoplasm of digestive organs: Secondary | ICD-10-CM | POA: Diagnosis not present

## 2020-12-08 DIAGNOSIS — R509 Fever, unspecified: Secondary | ICD-10-CM | POA: Diagnosis not present

## 2020-12-08 DIAGNOSIS — Z833 Family history of diabetes mellitus: Secondary | ICD-10-CM | POA: Diagnosis not present

## 2020-12-08 DIAGNOSIS — C162 Malignant neoplasm of body of stomach: Secondary | ICD-10-CM | POA: Diagnosis not present

## 2020-12-08 DIAGNOSIS — E860 Dehydration: Secondary | ICD-10-CM | POA: Diagnosis present

## 2020-12-08 DIAGNOSIS — R531 Weakness: Secondary | ICD-10-CM | POA: Diagnosis not present

## 2020-12-08 DIAGNOSIS — R319 Hematuria, unspecified: Secondary | ICD-10-CM | POA: Diagnosis not present

## 2020-12-08 LAB — COMPREHENSIVE METABOLIC PANEL
ALT: 88 U/L — ABNORMAL HIGH (ref 0–44)
AST: 62 U/L — ABNORMAL HIGH (ref 15–41)
Albumin: 3 g/dL — ABNORMAL LOW (ref 3.5–5.0)
Alkaline Phosphatase: 193 U/L — ABNORMAL HIGH (ref 38–126)
Anion gap: 6 (ref 5–15)
BUN: 18 mg/dL (ref 8–23)
CO2: 24 mmol/L (ref 22–32)
Calcium: 8.7 mg/dL — ABNORMAL LOW (ref 8.9–10.3)
Chloride: 100 mmol/L (ref 98–111)
Creatinine, Ser: 0.72 mg/dL (ref 0.61–1.24)
GFR, Estimated: >60 mL/min (ref >60)
Glucose, Bld: 127 mg/dL — ABNORMAL HIGH (ref 70–99)
Potassium: 4.7 mmol/L (ref 3.5–5.1)
Sodium: 130 mmol/L — ABNORMAL LOW (ref 135–145)
Total Bilirubin: 0.9 mg/dL (ref 0.3–1.2)
Total Protein: 7.1 g/dL (ref 6.5–8.1)

## 2020-12-08 LAB — URINALYSIS, ROUTINE W REFLEX MICROSCOPIC
Bacteria, UA: NONE SEEN
Bilirubin Urine: NEGATIVE
Glucose, UA: NEGATIVE mg/dL
Ketones, ur: NEGATIVE mg/dL
Leukocytes,Ua: NEGATIVE
Nitrite: NEGATIVE
Protein, ur: NEGATIVE mg/dL
Specific Gravity, Urine: 1.015 (ref 1.005–1.030)
pH: 6 (ref 5.0–8.0)

## 2020-12-08 LAB — CBC WITH DIFFERENTIAL/PLATELET
Abs Immature Granulocytes: 0.02 10*3/uL (ref 0.00–0.07)
Basophils Absolute: 0 10*3/uL (ref 0.0–0.1)
Basophils Relative: 1 %
Eosinophils Absolute: 0 10*3/uL (ref 0.0–0.5)
Eosinophils Relative: 1 %
HCT: 33.9 % — ABNORMAL LOW (ref 39.0–52.0)
Hemoglobin: 11.2 g/dL — ABNORMAL LOW (ref 13.0–17.0)
Immature Granulocytes: 1 %
Lymphocytes Relative: 31 %
Lymphs Abs: 0.4 10*3/uL — ABNORMAL LOW (ref 0.7–4.0)
MCH: 28.4 pg (ref 26.0–34.0)
MCHC: 33 g/dL (ref 30.0–36.0)
MCV: 85.8 fL (ref 80.0–100.0)
Monocytes Absolute: 0 10*3/uL — ABNORMAL LOW (ref 0.1–1.0)
Monocytes Relative: 2 %
Neutro Abs: 0.9 10*3/uL — ABNORMAL LOW (ref 1.7–7.7)
Neutrophils Relative %: 64 %
Platelets: 198 10*3/uL (ref 150–400)
RBC: 3.95 MIL/uL — ABNORMAL LOW (ref 4.22–5.81)
RDW: 14.1 % (ref 11.5–15.5)
WBC: 1.4 10*3/uL — CL (ref 4.0–10.5)
nRBC: 0 % (ref 0.0–0.2)

## 2020-12-08 LAB — LIPASE, BLOOD: Lipase: 29 U/L (ref 11–51)

## 2020-12-08 MED ORDER — SENNOSIDES-DOCUSATE SODIUM 8.6-50 MG PO TABS
1.0000 | ORAL_TABLET | Freq: Once | ORAL | Status: DC
  Filled 2020-12-08: qty 1

## 2020-12-08 MED ORDER — LACTULOSE 10 GM/15ML PO SOLN: 10.0000 g | Freq: Once | ORAL | Status: DC

## 2020-12-08 MED ORDER — SODIUM CHLORIDE 0.9 % IV BOLUS
1000.0000 mL | Freq: Once | INTRAVENOUS | Status: AC
  Administered 2020-12-08: 1000 mL via INTRAVENOUS

## 2020-12-08 MED ORDER — IOHEXOL 350 MG/ML SOLN
80.0000 mL | Freq: Once | INTRAVENOUS | Status: AC | PRN
  Administered 2020-12-08: 80 mL via INTRAVENOUS

## 2020-12-08 MED ORDER — SENNOSIDES-DOCUSATE SODIUM 8.6-50 MG PO TABS: 1.0000 | ORAL_TABLET | Freq: Every day | ORAL | 0 refills | Status: AC | PRN

## 2020-12-08 MED ORDER — POTASSIUM CHLORIDE 20 MEQ PO PACK: 20.0000 meq | PACK | Freq: Two times a day (BID) | ORAL | 0 refills | Status: AC

## 2020-12-08 NOTE — ED Notes (Signed)
New dressing applied

## 2020-12-08 NOTE — ED Triage Notes (Signed)
Per pt, states he has been constipated for 3 days-states he has been having some drainage from his abdominal tubes

## 2020-12-08 NOTE — Discharge Instructions (Addendum)
Take the potassium packets instead of the pills.  Take miralax, senna

## 2020-12-08 NOTE — ED Provider Notes (Signed)
[facility] DEPT Provider Note   CSN: [id number] Arrival date & time: 12/08/20  1521     History Chief Complaint  Patient presents with   Constipation    Jonathan Lewis is a 80 y.o. male.  Pt presents to the ED today with abdominal pain.  Pt has a hx of metastatic gastric cancer.  He has had a gastrectomy.  He developed a biliary blockage and sepsis from cholecystitis and has 2 drains.  His drains have been oozing fluid.  Pt has been losing weight.  He has been constipated for 3 days.  He denies n/v.  He is followed by Dr. Marin Olp.  Last chemo on 9/1.  Meds given on 9/1:  Potassium Chloride Crys ER 40 mEq, 40 mEq  GASTROESOPHAGEAL Carboplatin + Paclitaxel q21d  Day 1, Cycle 2 Palonosetron HCl 0.25 mg diphenhydrAMINE HCl 50 mg dexamethasone (DECADRON) 10 mg in sodium chloride... 10 mg Famotidine in NaCl 20 mg fosaprepitant (EMEND) 150 mg in sodium chloride... 150 mg PACLitaxel (TAXOL) 300 mg in sodium chloride 0.9 %... 300 mg CARBOplatin (PARAPLATIN) 400 mg in sodium chloride... 400 mg Sodium Chloride Flush 10 mL Heparin Lock Flush 500 Units  HEAD/NECK Pembrolizumab Q21D  Day 1, Cycle 2 Sodium Chloride 20 mL/hr pembrolizumab (KEYTRUDA) 200 mg in sodium chloride... 200 mg  VENOFER   Treatment 12 iron sucrose (VENOFER) 200 mg in sodium chloride... 200 mg       Past Medical History:  Diagnosis Date   Acoustic neuroma ([place]) 06/25/2015   Allergic rhinitis 07/18/2014   Allergy    Anemia    Arthritis    BPH (benign prostatic hyperplasia)    Cancer ([place])    stage III stomach-Dx 03/2019   Carbuncle    recurrent MRSA carbuncles   Cataract    Per pt bilateral cataracts removed.   Diabetes mellitus    Type II. Per pt Dr. Jenny Reichmann took him off his dm med.   Disk prolapse    Glaucoma    Hyperlipidemia    Hypertension    Male hypogonadism 07/16/2014   Neuropathy    Pneumonia    Sinus bradycardia    chronic, asymptomatic   Sinusitis  07/29/2012    Patient Active Problem List   Diagnosis Date Noted   Neuropathy 11/22/2020   Gait disorder 11/10/2020   Normocytic anemia 11/01/2020   Chronic combined systolic and diastolic CHF (congestive heart failure) ([place]) 11/01/2020   Hyponatremia 11/01/2020   Metastatic malignant neoplasm ([place])    Sepsis ([place]) 09/21/2020   Malignant neoplasm metastatic to intra-abdominal lymph node ([place]) 09/16/2020   Biliary obstruction 09/09/2020   Right knee pain 04/22/2020   Myelopathy ([place]) 01/08/2020   Iron deficiency anemia 06/04/2019   Abnormal PET scan, mediastinum 05/29/2019   Mediastinal adenopathy 05/22/2019   Goals of care, counseling/discussion 04/29/2019   Gastric cancer pT3pN2 s/p distal gastrectomy with [place] II gastrojejunostomy 03/19/2019 03/24/2019   H/O exploratory laparotomy 03/19/2019   Malnutrition of moderate degree 03/08/2019   Gastric outlet obstruction 03/07/2019   Weight loss 03/05/2019   Nausea and vomiting 03/05/2019   Decreased appetite 03/05/2019   Epigastric pain 12/05/2018   Hyperglycemia 12/05/2018   DDD (degenerative disc disease), cervical 04/16/2018   Spinal stenosis in cervical region 04/16/2018   Lumbar radiculopathy 01/02/2018   Leg pain, bilateral 11/08/2017   Bursitis of right hip 05/10/2017   Left shoulder pain 05/10/2017   Physical deconditioning 05/10/2017   Wheezing 12/13/2016   Cough 11/03/2016   Neck  pain on left side 11/03/2016   Bilateral leg paresthesia 11/10/2015   Sensorineural hearing loss (SNHL) of both ears 07/27/2015   Tinnitus of both ears 07/27/2015   Left acoustic neuroma ([place]) 06/25/2015   Hematochezia 05/13/2015   Abnormal finding on MRI of brain 05/13/2015   Vertigo 05/05/2015   GERD (gastroesophageal reflux disease) 05/05/2015   BPH (benign prostatic hyperplasia) 05/05/2015   Bradycardia    Chest pain 07/18/2014   Allergic rhinitis 07/18/2014   Pain of right thumb 07/18/2014   Male hypogonadism 07/16/2014    Encounter for well adult exam with abnormal findings 07/17/2013   Right sided sciatica 07/17/2013   Constipation 01/21/2012   Leukopenia 06/11/2011   HEARING LOSS 04/10/2009   GROIN PAIN 01/26/2009   Osteoarthritis, hand 10/27/2008   LOW BACK PAIN, CHRONIC 10/27/2008   Diet-controlled diabetes mellitus ([place]) 07/17/2008   Hyperlipidemia 06/06/2007   EXTERNAL HEMORRHOIDS 02/15/2007   Benign nodular prostatic hyperplasia 02/15/2007   ERECTILE DYSFUNCTION 10/31/2006   Hypertension complicating diabetes ([place]) 10/31/2006    Past Surgical History:  Procedure Laterality Date   ANTERIOR CERVICAL DECOMPRESSION/DISCECTOMY FUSION 4 LEVELS N/A 01/08/2020   Procedure: ANTERIOR CERVICAL DECOMPRESSION FUSION CERVICAL 3-4, CERVICAL 4-5, CERVIAL 5-6 WITH INSTRUMENTATION AND ALLOGRAFT;  Surgeon: Phylliss Bob, MD;  Location: [facility];  Service: Orthopedics;  Laterality: N/A;   BALLOON DILATION N/A 03/12/2019   Procedure: BALLOON DILATION;  Surgeon: Rush Landmark Telford Nab., MD;  Location: [facility];  Service: Gastroenterology;  Laterality: N/A;  pyloric   BIOPSY  03/08/2019   Procedure: BIOPSY;  Surgeon: Yetta Flock, MD;  Location: [facility];  Service: Gastroenterology;;   BIOPSY  03/12/2019   Procedure: BIOPSY;  Surgeon: Irving Copas., MD;  Location: [facility];  Service: Gastroenterology;;   CATARACT EXTRACTION     x 2   COLONOSCOPY     ENDOSCOPIC RETROGRADE CHOLANGIOPANCREATOGRAPHY (ERCP) WITH PROPOFOL N/A 09/09/2020   Procedure: ENDOSCOPIC RETROGRADE CHOLANGIOPANCREATOGRAPHY (ERCP) WITH PROPOFOL;  Surgeon: Irving Copas., MD;  Location: [facility];  Service: Gastroenterology;  Laterality: N/A;   ESOPHAGOGASTRODUODENOSCOPY N/A 03/08/2019   Procedure: ESOPHAGOGASTRODUODENOSCOPY (EGD);  Surgeon: Yetta Flock, MD;  Location: [facility];  Service: Gastroenterology;  Laterality: N/A;   ESOPHAGOGASTRODUODENOSCOPY (EGD) WITH PROPOFOL N/A 03/12/2019   Procedure:  ESOPHAGOGASTRODUODENOSCOPY (EGD) WITH PROPOFOL;  Surgeon: Rush Landmark Telford Nab., MD;  [facility];  Service: Gastroenterology;  Laterality: N/A;   FINE NEEDLE ASPIRATION  03/12/2019   Procedure: FINE NEEDLE ASPIRATION (FNA) LINEAR;  Surgeon: Irving Copas., MD;  Location: [facility];  Service: Gastroenterology;;   FOREIGN BODY REMOVAL  03/08/2019   Procedure: FOREIGN BODY REMOVAL;  Surgeon: Yetta Flock, MD;  Location: [facility];  Service: Gastroenterology;;   IR BILIARY DRAIN PLACEMENT WITH CHOLANGIOGRAM  09/09/2020   IR CHOLANGIOGRAM EXISTING TUBE  09/29/2020   IR CHOLANGIOGRAM EXISTING TUBE  09/29/2020   IR CHOLANGIOGRAM EXISTING TUBE  11/13/2020   IR CHOLANGIOGRAM EXISTING TUBE  11/13/2020   IR EXCHANGE BILIARY DRAIN  09/24/2020   IR EXCHANGE BILIARY DRAIN  10/30/2020   IR EXCHANGE BILIARY DRAIN  10/30/2020   IR INT EXT BILIARY DRAIN WITH CHOLANGIOGRAM  09/10/2020   IR PATIENT EVAL TECH 0-60 MINS  04/03/2019   IR PERC CHOLECYSTOSTOMY  09/24/2020   LAPAROTOMY N/A 03/19/2019   Procedure: EXPLORATORY LAPAROTOMY, distal GASTRECTOMY AND PLACEMENT OF G AND J TUBE, gastric jejunostomy;  Surgeon: Greer Pickerel, MD;  Location: [facility];  Service: General;  Laterality: N/A;   POLYPECTOMY     PORTACATH  PLACEMENT N/A 03/26/2019   Procedure: INSERTION PORT-A-CATH;  Surgeon: Michael Boston, MD;  Location: [facility];  Service: General;  Laterality: N/A;   [place] INJECTION  09/09/2020   Procedure: SUBMUCOSAL TATTOO INJECTION;  Surgeon: Irving Copas., MD;  Location: [facility];  Service: Gastroenterology;;   UPPER ESOPHAGEAL ENDOSCOPIC ULTRASOUND (EUS) N/A 03/12/2019   Procedure: UPPER ESOPHAGEAL ENDOSCOPIC ULTRASOUND (EUS);  Surgeon: Irving Copas., MD;  Location: [facility];  Service: Gastroenterology;  Laterality: N/A;   UPPER GASTROINTESTINAL ENDOSCOPY     VIDEO BRONCHOSCOPY WITH ENDOBRONCHIAL ULTRASOUND N/A 05/22/2019   Procedure:  VIDEO BRONCHOSCOPY WITH ENDOBRONCHIAL ULTRASOUND;  Surgeon: Garner Nash, DO;  Location: [facility];  Service: Thoracic;  Laterality: N/A;       Family History  Problem Relation Age of Onset   Cancer Mother    Hypertension Mother    Diabetes Sister    Cancer Brother        colon   Hypertension Brother    Hyperlipidemia Brother    Stroke Brother    Hypertension Sister    Colon cancer Neg Hx    Heart attack Neg Hx    Sudden death Neg Hx    Esophageal cancer Neg Hx    Pancreatic cancer Neg Hx    Stomach cancer Neg Hx    Rectal cancer Neg Hx     Social History   Tobacco Use   Smoking status: Former    Packs/day: 1.00    Years: 24.00    Pack years: 24.00    Types: Cigarettes    Quit date: 10/03/1978    Years since quitting: 42.2   Smokeless tobacco: Never  Vaping Use   Vaping Use: Never used  Substance Use Topics   Alcohol use: Not Currently   Drug use: No    Home Medications Prior to Admission medications   Medication Sig Start Date End Date Taking? Authorizing Provider  atorvastatin (LIPITOR) 20 MG tablet Take 20 mg by mouth at bedtime. 10/30/20  Yes [provider]  dexamethasone (DECADRON) 4 MG tablet Take 2 tablets (8 mg total) by mouth daily. Start the day after carboplatin chemotherapy for 3 days. Patient taking differently: Take 8 mg by mouth See admin instructions. Take 8 mg by mouth daily- Start the day after carboplatin chemotherapy for 3 days. 11/16/20  Yes Volanda Napoleon, MD  dorzolamide-timolol (COSOPT) 22.3-6.8 MG/ML ophthalmic solution Place 1 drop into both eyes 2 (two) times daily. 06/15/20  Yes [provider]  famotidine (PEPCID) 20 MG tablet TAKE 1 TABLET BY MOUTH EVERY DAY Patient taking differently: Take 20 mg by mouth daily. 12/08/20  Yes Ennever, Rudell Cobb, MD  latanoprost (XALATAN) 0.005 % ophthalmic solution Place 1 drop into both eyes at bedtime.  01/09/19  Yes [provider]  lidocaine-prilocaine (EMLA) cream Apply 1  application topically as needed. Place on the port one hour before appointment. Patient taking differently: Apply 1 application topically as needed (as directed- Place on the port one hour before appointment). 08/24/20  Yes Volanda Napoleon, MD  Multiple Vitamin (MULTIVITAMIN WITH MINERALS) TABS tablet Take 1 tablet by mouth in the morning. Men's One-A-Day Chewable   Yes [provider]  ondansetron (ZOFRAN) 8 MG tablet Take 1 tablet (8 mg total) by mouth every 8 (eight) hours as needed for nausea or vomiting. 09/16/20  Yes Hayden Pedro, PA-C  potassium chloride (KLOR-CON) 20 MEQ packet Take 20 mEq by mouth 2 (  two) times daily. 12/08/20 01/07/21 Yes Isla Pence, MD  SYSTANE ULTRA 0.4-0.3 % SOLN Place 1 drop into both eyes 3 (three) times daily as needed (dry eyes). 01/09/19  Yes [provider]  meclizine (ANTIVERT) 12.5 MG tablet TAKE 1 TABLET BY MOUTH 3 TIMES DAILY AS NEEDED FOR DIZZINESS. Patient not taking: Reported on 12/08/2020 08/10/20   Biagio Borg, MD  senna-docusate (SENOKOT S) 8.6-50 MG tablet Take 1 tablet by mouth daily as needed for mild constipation. 12/08/20   Isla Pence, MD    Allergies    Patient has no known allergies.  Review of Systems   Review of Systems  Gastrointestinal:  Positive for abdominal pain and constipation.  All other systems reviewed and are negative.  Physical Exam Updated Vital Signs BP 124/80   Pulse 70   Temp 98.1 F (36.7 C) (Oral)   Resp 16   SpO2 100%   Physical Exam Vitals and nursing note reviewed.  Constitutional:      Appearance: Normal appearance.  HENT:     Head: Normocephalic and atraumatic.     Right Ear: External ear normal.     Left Ear: External ear normal.     Nose: Nose normal.     Mouth/Throat:     Mouth: Mucous membranes are dry.  Eyes:     Extraocular Movements: Extraocular movements intact.     Conjunctiva/sclera: Conjunctivae normal.     Pupils: Pupils are equal, round, and reactive to  light.  Cardiovascular:     Rate and Rhythm: Normal rate and regular rhythm.     Pulses: Normal pulses.     Heart sounds: Normal heart sounds.  Pulmonary:     Effort: Pulmonary effort is normal.     Breath sounds: Normal breath sounds.  Abdominal:     General: Abdomen is flat. Bowel sounds are normal.     Palpations: Abdomen is soft.     Comments: Drains are in place.  No active drainage noted around tubes.  Bags have fluid in them and drains are working.  Musculoskeletal:        General: Normal range of motion.     Cervical back: Normal range of motion and neck supple.  Skin:    General: Skin is warm.     Capillary Refill: Capillary refill takes less than 2 seconds.  Neurological:     General: No focal deficit present.     Mental Status: He is alert and oriented to person, place, and time.  Psychiatric:        Mood and Affect: Mood normal.        Behavior: Behavior normal.    ED Results / Procedures / Treatments   Labs (all labs ordered are listed, but only abnormal results are displayed) Labs Reviewed  CBC WITH DIFFERENTIAL/PLATELET - Abnormal; Notable for the following components:      Result Value   WBC 1.4 (*)    RBC 3.95 (*)    Hemoglobin 11.2 (*)    HCT 33.9 (*)    Neutro Abs 0.9 (*)    Lymphs Abs 0.4 (*)    Monocytes Absolute 0.0 (*)    All other components within normal limits  COMPREHENSIVE METABOLIC PANEL - Abnormal; Notable for the following components:   Sodium 130 (*)    Glucose, Bld 127 (*)    Calcium 8.7 (*)    Albumin 3.0 (*)    AST 62 (*)    ALT 88 (*)  Alkaline Phosphatase 193 (*)    All other components within normal limits  URINALYSIS, ROUTINE W REFLEX MICROSCOPIC - Abnormal; Notable for the following components:   Hgb urine dipstick TRACE (*)    All other components within normal limits  LIPASE, BLOOD    EKG None  Radiology CT Abdomen Pelvis W Contrast  Result Date: 12/08/2020 CLINICAL DATA:  Bowel obstruction suspected EXAM: CT  ABDOMEN AND PELVIS WITH CONTRAST TECHNIQUE: Multidetector CT imaging of the abdomen and pelvis was performed using the standard protocol following bolus administration of intravenous contrast. CONTRAST:  24m OMNIPAQUE IOHEXOL 350 MG/ML SOLN COMPARISON:  11/21/2020 FINDINGS: Lower chest: No acute abnormality. Hepatobiliary: Cholecystostomy tube in place as well as left percutaneous transhepatic biliary drain. Mild intrahepatic biliary ductal dilatation. Small scattered hypodensities in the liver, likely small cysts. Pancreas: No focal abnormality or ductal dilatation. Spleen: No focal abnormality.  Normal size. Adrenals/Urinary Tract: No adrenal abnormality. No focal renal abnormality. No stones or hydronephrosis. Urinary bladder is unremarkable. Stomach/Bowel: Postoperative changes in the stomach. Moderate stool burden in the colon. Stomach, large and small bowel grossly unremarkable. Vascular/Lymphatic: Aorta normal caliber. No evidence of aneurysm or adenopathy. Reproductive: Prostate prominence with calcifications. Other: No free fluid or free air. Small peritoneal nodules are again seen in the left abdomen and right upper abdomen, unchanged. Musculoskeletal: No acute bony abnormality. IMPRESSION: Percutaneous cholecystostomy and percutaneous biliary drain remain in place, unchanged. Stable mild intrahepatic biliary ductal dilatation. Moderate stool burden throughout the colon. Stable evidence of peritoneal disease. No acute findings. Electronically Signed   By: KRolm BaptiseM.D.   On: 12/08/2020 19:06    Procedures Procedures   Medications Ordered in ED Medications  senna-docusate (Senokot-S) tablet 1 tablet (has no administration in time range)  sodium chloride 0.9 % bolus 1,000 mL (0 mLs Intravenous Stopped 12/08/20 1855)  iohexol (OMNIPAQUE) 350 MG/ML injection 80 mL (80 mLs Intravenous Contrast Given 12/08/20 1823)    ED Course  I have reviewed the triage vital signs and the nursing  notes.  Pertinent labs & imaging results that were available during my care of the patient were reviewed by me and considered in my medical decision making (see chart for details).    MDM Rules/Calculators/A&P                           Pt's wbc is 1.4, but he just had chemo on 9/1 and did not get the Neulasta as he had difficulties with it when he had it last.  Pt's bilirubin is nl.  His ct shows his drains to be in the correct position.  His tubes were redressed.   Pt's ct does not show a sbo, but it does show constipation.  Pt does not want an enema here.  He is d/c with senna as he's already tried miralax.  The pt has been drinking a lot of Ensure to increase his calorie and protein intake.  So, the dairy may be contributing to the constipation.  The pt is given a list of high fiber foods.  He is also encouraged to eat high protein whole foods.   He is to return if worse.  F/u with pcp/Dr. EMarin Olp   Final Clinical Impression(s) / ED Diagnoses Final diagnoses:  Constipation, unspecified constipation type    Rx / DC Orders ED Discharge Orders          Ordered    potassium chloride (KLOR-CON) 20 MEQ packet  2  times daily        12/08/20 1924    senna-docusate (SENOKOT S) 8.6-50 MG tablet  Daily PRN        12/08/20 1924             Isla Pence, MD 12/08/20 1929

## 2020-12-09 ENCOUNTER — Ambulatory Visit: Payer: Medicare PPO | Admitting: Internal Medicine

## 2020-12-10 ENCOUNTER — Encounter (HOSPITAL_COMMUNITY): Payer: Self-pay

## 2020-12-10 ENCOUNTER — Other Ambulatory Visit (HOSPITAL_COMMUNITY): Payer: Self-pay | Admitting: Student

## 2020-12-10 ENCOUNTER — Other Ambulatory Visit (HOSPITAL_COMMUNITY): Payer: Medicare PPO

## 2020-12-10 ENCOUNTER — Emergency Department (HOSPITAL_COMMUNITY): Payer: Medicare PPO

## 2020-12-10 ENCOUNTER — Encounter: Payer: Self-pay | Admitting: Hematology & Oncology

## 2020-12-10 ENCOUNTER — Inpatient Hospital Stay (HOSPITAL_COMMUNITY)
Admission: EM | Admit: 2020-12-10 | Discharge: 2020-12-14 | DRG: 809 | Disposition: A | Discharge status: Home Health | Payer: Medicare PPO | Attending: Internal Medicine | Admitting: Internal Medicine

## 2020-12-10 ENCOUNTER — Telehealth: Payer: Self-pay | Admitting: *Deleted

## 2020-12-10 ENCOUNTER — Other Ambulatory Visit: Payer: Self-pay

## 2020-12-10 DIAGNOSIS — K831 Obstruction of bile duct: Secondary | ICD-10-CM | POA: Diagnosis present

## 2020-12-10 DIAGNOSIS — Z85028 Personal history of other malignant neoplasm of stomach: Secondary | ICD-10-CM

## 2020-12-10 DIAGNOSIS — Z515 Encounter for palliative care: Secondary | ICD-10-CM | POA: Diagnosis not present

## 2020-12-10 DIAGNOSIS — C772 Secondary and unspecified malignant neoplasm of intra-abdominal lymph nodes: Secondary | ICD-10-CM

## 2020-12-10 DIAGNOSIS — R748 Abnormal levels of other serum enzymes: Secondary | ICD-10-CM | POA: Diagnosis present

## 2020-12-10 DIAGNOSIS — C162 Malignant neoplasm of body of stomach: Secondary | ICD-10-CM | POA: Diagnosis not present

## 2020-12-10 DIAGNOSIS — D630 Anemia in neoplastic disease: Secondary | ICD-10-CM | POA: Diagnosis present

## 2020-12-10 DIAGNOSIS — Z823 Family history of stroke: Secondary | ICD-10-CM

## 2020-12-10 DIAGNOSIS — R319 Hematuria, unspecified: Secondary | ICD-10-CM

## 2020-12-10 DIAGNOSIS — E860 Dehydration: Secondary | ICD-10-CM | POA: Diagnosis present

## 2020-12-10 DIAGNOSIS — Z6822 Body mass index (BMI) 22.0-22.9, adult: Secondary | ICD-10-CM

## 2020-12-10 DIAGNOSIS — D61810 Antineoplastic chemotherapy induced pancytopenia: Secondary | ICD-10-CM

## 2020-12-10 DIAGNOSIS — E876 Hypokalemia: Secondary | ICD-10-CM | POA: Diagnosis present

## 2020-12-10 DIAGNOSIS — R634 Abnormal weight loss: Secondary | ICD-10-CM | POA: Diagnosis present

## 2020-12-10 DIAGNOSIS — R5081 Fever presenting with conditions classified elsewhere: Secondary | ICD-10-CM | POA: Diagnosis present

## 2020-12-10 DIAGNOSIS — Z923 Personal history of irradiation: Secondary | ICD-10-CM | POA: Diagnosis not present

## 2020-12-10 DIAGNOSIS — Z83438 Family history of other disorder of lipoprotein metabolism and other lipidemia: Secondary | ICD-10-CM | POA: Diagnosis not present

## 2020-12-10 DIAGNOSIS — C169 Malignant neoplasm of stomach, unspecified: Secondary | ICD-10-CM | POA: Diagnosis not present

## 2020-12-10 DIAGNOSIS — C161 Malignant neoplasm of fundus of stomach: Secondary | ICD-10-CM

## 2020-12-10 DIAGNOSIS — Z903 Acquired absence of stomach [part of]: Secondary | ICD-10-CM | POA: Diagnosis not present

## 2020-12-10 DIAGNOSIS — C786 Secondary malignant neoplasm of retroperitoneum and peritoneum: Secondary | ICD-10-CM | POA: Diagnosis present

## 2020-12-10 DIAGNOSIS — R531 Weakness: Secondary | ICD-10-CM | POA: Diagnosis not present

## 2020-12-10 DIAGNOSIS — G629 Polyneuropathy, unspecified: Secondary | ICD-10-CM | POA: Diagnosis present

## 2020-12-10 DIAGNOSIS — Z79899 Other long term (current) drug therapy: Secondary | ICD-10-CM

## 2020-12-10 DIAGNOSIS — Z833 Family history of diabetes mellitus: Secondary | ICD-10-CM

## 2020-12-10 DIAGNOSIS — D709 Neutropenia, unspecified: Principal | ICD-10-CM | POA: Diagnosis present

## 2020-12-10 DIAGNOSIS — K5900 Constipation, unspecified: Secondary | ICD-10-CM | POA: Diagnosis present

## 2020-12-10 DIAGNOSIS — C7889 Secondary malignant neoplasm of other digestive organs: Secondary | ICD-10-CM | POA: Diagnosis present

## 2020-12-10 DIAGNOSIS — R509 Fever, unspecified: Secondary | ICD-10-CM | POA: Diagnosis not present

## 2020-12-10 DIAGNOSIS — K819 Cholecystitis, unspecified: Secondary | ICD-10-CM | POA: Diagnosis present

## 2020-12-10 DIAGNOSIS — Z931 Gastrostomy status: Secondary | ICD-10-CM

## 2020-12-10 DIAGNOSIS — Z66 Do not resuscitate: Secondary | ICD-10-CM | POA: Diagnosis present

## 2020-12-10 DIAGNOSIS — Z8249 Family history of ischemic heart disease and other diseases of the circulatory system: Secondary | ICD-10-CM | POA: Diagnosis not present

## 2020-12-10 DIAGNOSIS — T451X5A Adverse effect of antineoplastic and immunosuppressive drugs, initial encounter: Secondary | ICD-10-CM | POA: Diagnosis present

## 2020-12-10 DIAGNOSIS — Z8719 Personal history of other diseases of the digestive system: Secondary | ICD-10-CM

## 2020-12-10 DIAGNOSIS — E871 Hypo-osmolality and hyponatremia: Secondary | ICD-10-CM | POA: Diagnosis present

## 2020-12-10 DIAGNOSIS — N400 Enlarged prostate without lower urinary tract symptoms: Secondary | ICD-10-CM | POA: Diagnosis not present

## 2020-12-10 DIAGNOSIS — Z800 Family history of malignant neoplasm of digestive organs: Secondary | ICD-10-CM

## 2020-12-10 DIAGNOSIS — D638 Anemia in other chronic diseases classified elsewhere: Secondary | ICD-10-CM | POA: Diagnosis present

## 2020-12-10 DIAGNOSIS — D61818 Other pancytopenia: Secondary | ICD-10-CM | POA: Diagnosis present

## 2020-12-10 DIAGNOSIS — Z20822 Contact with and (suspected) exposure to covid-19: Secondary | ICD-10-CM | POA: Diagnosis present

## 2020-12-10 DIAGNOSIS — E875 Hyperkalemia: Secondary | ICD-10-CM | POA: Diagnosis not present

## 2020-12-10 DIAGNOSIS — C160 Malignant neoplasm of cardia: Secondary | ICD-10-CM

## 2020-12-10 LAB — COMPREHENSIVE METABOLIC PANEL
ALT: 125 U/L — ABNORMAL HIGH (ref 0–44)
AST: 76 U/L — ABNORMAL HIGH (ref 15–41)
Albumin: 3.4 g/dL — ABNORMAL LOW (ref 3.5–5.0)
Alkaline Phosphatase: 211 U/L — ABNORMAL HIGH (ref 38–126)
Anion gap: 7 (ref 5–15)
BUN: 15 mg/dL (ref 8–23)
CO2: 22 mmol/L (ref 22–32)
Calcium: 9.1 mg/dL (ref 8.9–10.3)
Chloride: 101 mmol/L (ref 98–111)
Creatinine, Ser: 0.69 mg/dL (ref 0.61–1.24)
GFR, Estimated: >60 mL/min (ref >60)
Glucose, Bld: 118 mg/dL — ABNORMAL HIGH (ref 70–99)
Potassium: 5.2 mmol/L — ABNORMAL HIGH (ref 3.5–5.1)
Sodium: 130 mmol/L — ABNORMAL LOW (ref 135–145)
Total Bilirubin: 1.1 mg/dL (ref 0.3–1.2)
Total Protein: 7.9 g/dL (ref 6.5–8.1)

## 2020-12-10 LAB — CBC WITH DIFFERENTIAL/PLATELET
Abs Immature Granulocytes: 0.01 10*3/uL (ref 0.00–0.07)
Basophils Absolute: 0 10*3/uL (ref 0.0–0.1)
Basophils Relative: 1 %
Eosinophils Absolute: 0 10*3/uL (ref 0.0–0.5)
Eosinophils Relative: 1 %
HCT: 35.3 % — ABNORMAL LOW (ref 39.0–52.0)
Hemoglobin: 11.7 g/dL — ABNORMAL LOW (ref 13.0–17.0)
Immature Granulocytes: 1 %
Lymphocytes Relative: 38 %
Lymphs Abs: 0.3 10*3/uL — ABNORMAL LOW (ref 0.7–4.0)
MCH: 28.1 pg (ref 26.0–34.0)
MCHC: 33.1 g/dL (ref 30.0–36.0)
MCV: 84.9 fL (ref 80.0–100.0)
Monocytes Absolute: 0 10*3/uL — ABNORMAL LOW (ref 0.1–1.0)
Monocytes Relative: 3 %
Neutro Abs: 0.5 10*3/uL — ABNORMAL LOW (ref 1.7–7.7)
Neutrophils Relative %: 56 %
Platelets: 218 10*3/uL (ref 150–400)
RBC: 4.16 MIL/uL — ABNORMAL LOW (ref 4.22–5.81)
RDW: 14.1 % (ref 11.5–15.5)
WBC: 0.9 10*3/uL — CL (ref 4.0–10.5)
nRBC: 0 % (ref 0.0–0.2)

## 2020-12-10 LAB — URINALYSIS, ROUTINE W REFLEX MICROSCOPIC
Bilirubin Urine: NEGATIVE
Glucose, UA: NEGATIVE mg/dL
Hgb urine dipstick: NEGATIVE
Ketones, ur: NEGATIVE mg/dL
Leukocytes,Ua: NEGATIVE
Nitrite: NEGATIVE
Protein, ur: NEGATIVE mg/dL
Specific Gravity, Urine: 1.009 (ref 1.005–1.030)
pH: 5 (ref 5.0–8.0)

## 2020-12-10 LAB — PROTIME-INR
INR: 1.2 (ref 0.8–1.2)
Prothrombin Time: 15 seconds (ref 11.4–15.2)

## 2020-12-10 LAB — RESP PANEL BY RT-PCR (FLU A&B, COVID) ARPGX2
Influenza A by PCR: NEGATIVE
Influenza B by PCR: NEGATIVE
SARS Coronavirus 2 by RT PCR: NEGATIVE

## 2020-12-10 LAB — LACTIC ACID, PLASMA
Lactic Acid, Venous: 1.7 mmol/L (ref 0.5–1.9)
Lactic Acid, Venous: 1 mmol/L (ref 0.5–1.9)

## 2020-12-10 MED ORDER — POLYVINYL ALCOHOL 1.4 % OP SOLN
1.0000 [drp] | Freq: Three times a day (TID) | OPHTHALMIC | Status: DC | PRN
  Filled 2020-12-10: qty 15

## 2020-12-10 MED ORDER — SODIUM ZIRCONIUM CYCLOSILICATE 5 G PO PACK
5.0000 g | PACK | Freq: Once | ORAL | Status: AC
  Administered 2020-12-10: 5 g via ORAL
  Filled 2020-12-10: qty 1

## 2020-12-10 MED ORDER — ONDANSETRON 4 MG PO TBDP
4.0000 mg | ORAL_TABLET | Freq: Once | ORAL | Status: AC
  Administered 2020-12-10: 4 mg via ORAL
  Filled 2020-12-10: qty 1

## 2020-12-10 MED ORDER — FAMOTIDINE 20 MG PO TABS: 20.0000 mg | ORAL_TABLET | Freq: Every day | ORAL | Status: DC

## 2020-12-10 MED ORDER — POLYETHYLENE GLYCOL 3350 17 G PO PACK
17.0000 g | PACK | Freq: Two times a day (BID) | ORAL | Status: DC
  Administered 2020-12-10 – 2020-12-14 (×7): 17 g via ORAL
  Filled 2020-12-10 (×7): qty 1

## 2020-12-10 MED ORDER — DOXYLAMINE SUCCINATE (SLEEP) 25 MG PO TABS
25.0000 mg | ORAL_TABLET | Freq: Once | ORAL | Status: AC
  Administered 2020-12-10: 25 mg via ORAL
  Filled 2020-12-10: qty 1

## 2020-12-10 MED ORDER — SODIUM CHLORIDE 0.9 % IV BOLUS
2000.0000 mL | Freq: Once | INTRAVENOUS | Status: AC
  Administered 2020-12-10: 2000 mL via INTRAVENOUS

## 2020-12-10 MED ORDER — SENNOSIDES-DOCUSATE SODIUM 8.6-50 MG PO TABS
2.0000 | ORAL_TABLET | Freq: Two times a day (BID) | ORAL | Status: DC
  Administered 2020-12-10 – 2020-12-14 (×8): 2 via ORAL
  Filled 2020-12-10 (×8): qty 2

## 2020-12-10 MED ORDER — TBO-FILGRASTIM 480 MCG/0.8ML ~~LOC~~ SOSY
480.0000 ug | PREFILLED_SYRINGE | Freq: Every day | SUBCUTANEOUS | Status: DC
  Administered 2020-12-10 – 2020-12-13 (×4): 480 ug via SUBCUTANEOUS
  Filled 2020-12-10 (×4): qty 0.8

## 2020-12-10 MED ORDER — MECLIZINE HCL 25 MG PO TABS: 12.5000 mg | ORAL_TABLET | Freq: Every day | ORAL | Status: DC | PRN

## 2020-12-10 MED ORDER — SODIUM CHLORIDE 0.9 % IV SOLN
2.0000 g | Freq: Once | INTRAVENOUS | Status: AC
  Administered 2020-12-10: 2 g via INTRAVENOUS
  Filled 2020-12-10: qty 2

## 2020-12-10 MED ORDER — LATANOPROST 0.005 % OP SOLN
1.0000 [drp] | Freq: Every day | OPHTHALMIC | Status: DC
  Administered 2020-12-10 – 2020-12-13 (×4): 1 [drp] via OPHTHALMIC
  Filled 2020-12-10: qty 2.5

## 2020-12-10 MED ORDER — IOHEXOL 350 MG/ML SOLN
80.0000 mL | Freq: Once | INTRAVENOUS | Status: AC | PRN
  Administered 2020-12-10: 80 mL via INTRAVENOUS

## 2020-12-10 MED ORDER — DORZOLAMIDE HCL-TIMOLOL MAL 2-0.5 % OP SOLN
1.0000 [drp] | Freq: Two times a day (BID) | OPHTHALMIC | Status: DC
  Administered 2020-12-11: 1 [drp] via OPHTHALMIC
  Filled 2020-12-10: qty 10

## 2020-12-10 NOTE — Telephone Encounter (Signed)
Call placed to patient's wife, Pamala Hurry to notify her per order of Dr. Marin Olp to take pt to the [facility] ER now d/t neutropenia and fever.  Pt.'s wife states that she will take pt now to the [facility] ER and is appreciative of call back.

## 2020-12-10 NOTE — ED Provider Notes (Signed)
Emergency Medicine Provider Triage Evaluation Note  Jonathan Lewis , a 80 y.o. male  was evaluated in triage.  Pt complains of fever that began yesterday.  He is currently being treated for metastatic gastric cancer.  Was sent by his oncologist.  Fever up to 101 at home.  Has 2 drains in place of the abdominal wall status post biliary blockage from cholecystitis.  Review of Systems  Positive: Nausea Negative: Chills, vomiting, abdominal pain, urinary complaints, cough   Physical Exam  There were no vitals taken for this visit. Gen:   Awake, no distress   Resp:  Normal effort  MSK:   Moves extremities without difficulty  Other:  Has surgical drains in place over the abdomen.  Actively draining yellowish fluid.  Abdomen is nontender to palpation.  Medical Decision Making  Medically screening exam initiated at 12:44 PM.  Appropriate orders placed.  Casper Harrison was informed that the remainder of the evaluation will be completed by another provider, this initial triage assessment does not replace that evaluation, and the importance of remaining in the ED until their evaluation is complete.     Myna Bright [place], PA-C 12/10/20 1247    Milton Ferguson, MD 12/12/20 1252

## 2020-12-10 NOTE — ED Provider Notes (Signed)
[facility] DEPT Provider Note   CSN: [id number] Arrival date & time: 12/10/20  1230     History Chief Complaint  Patient presents with   Fever    Jonathan Lewis is a 80 y.o. male.  Patient has a history of stomach cancer with removal of his stomach.  He is getting chemotherapy and the last time he got it was Thursday.  He states that for the last couple days he has had a fever over 100 that was taken axillary and he complains of constipation  The history is provided by the patient and medical records. No language interpreter was used.  Fever Temp source:  Axillary Severity:  Mild Onset quality:  Sudden Duration:  2 days Timing:  Intermittent Chronicity:  New Relieved by:  Nothing Worsened by:  Nothing Ineffective treatments:  None tried Associated symptoms: no chest pain, no congestion, no cough, no diarrhea, no headaches and no rash   Risk factors: no contaminated food       Past Medical History:  Diagnosis Date   Acoustic neuroma ([place]) 06/25/2015   Allergic rhinitis 07/18/2014   Allergy    Anemia    Arthritis    BPH (benign prostatic hyperplasia)    Cancer ([place])    stage III stomach-Dx 03/2019   Carbuncle    recurrent MRSA carbuncles   Cataract    Per pt bilateral cataracts removed.   Diabetes mellitus    Type II. Per pt Dr. Jenny Reichmann took him off his dm med.   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 ([place])  09/16/2020   Biliary obstruction 09/09/2020   Right knee pain 04/22/2020   Myelopathy ([place]) 01/08/2020   Iron deficiency anemia 06/04/2019   Abnormal PET scan, mediastinum 05/29/2019   Mediastinal adenopathy 05/22/2019   Goals of care, counseling/discussion 04/29/2019   Gastric cancer pT3pN2 s/p distal gastrectomy with Billroth II gastrojejunostomy 03/19/2019 03/24/2019   H/O exploratory laparotomy 03/19/2019   Malnutrition of moderate degree 03/08/2019   Gastric outlet obstruction 03/07/2019   Weight loss 03/05/2019   Nausea and vomiting 03/05/2019   Decreased appetite 03/05/2019   Epigastric pain 12/05/2018   Hyperglycemia 12/05/2018   DDD (degenerative disc disease), cervical 04/16/2018   Spinal stenosis in cervical region 04/16/2018   Lumbar radiculopathy 01/02/2018   Leg pain, bilateral 11/08/2017   Bursitis of right hip 05/10/2017   Left shoulder pain 05/10/2017   Physical deconditioning 05/10/2017   Wheezing 12/13/2016   Cough 11/03/2016   Neck pain on left side 11/03/2016   Bilateral leg paresthesia 11/10/2015   Sensorineural hearing loss (SNHL) of both ears 07/27/2015   Tinnitus of both ears 07/27/2015   Left acoustic neuroma ([place]) 06/25/2015   Hematochezia 05/13/2015   Abnormal finding on MRI of brain 05/13/2015   Vertigo 05/05/2015   GERD (gastroesophageal reflux disease) 05/05/2015   BPH (benign prostatic hyperplasia) 05/05/2015   Bradycardia    Chest  pain 07/18/2014   Allergic rhinitis 07/18/2014   Pain of right thumb 07/18/2014   Male hypogonadism 07/16/2014   Encounter for well adult exam with abnormal findings 07/17/2013   Right sided sciatica 07/17/2013   Constipation 01/21/2012   Leukopenia 06/11/2011   HEARING LOSS 04/10/2009   GROIN PAIN 01/26/2009   Osteoarthritis, hand 10/27/2008   LOW BACK PAIN, CHRONIC 10/27/2008   Diet-controlled diabetes mellitus ([place]) 07/17/2008   Hyperlipidemia 06/06/2007   EXTERNAL HEMORRHOIDS 02/15/2007   Benign  nodular prostatic hyperplasia 02/15/2007   ERECTILE DYSFUNCTION 10/31/2006   Hypertension complicating diabetes ([place]) 10/31/2006    Past Surgical History:  Procedure Laterality Date   ANTERIOR CERVICAL DECOMPRESSION/DISCECTOMY FUSION 4 LEVELS N/A 01/08/2020   Procedure: ANTERIOR CERVICAL DECOMPRESSION FUSION CERVICAL 3-4, CERVICAL 4-5, CERVIAL 5-6 WITH INSTRUMENTATION AND ALLOGRAFT;  Surgeon: Phylliss Bob, MD;  Location: [facility];  Service: Orthopedics;  Laterality: N/A;   BALLOON DILATION N/A 03/12/2019   Procedure: BALLOON DILATION;  Surgeon: Rush Landmark Telford Nab., MD;  Location: [facility];  Service: Gastroenterology;  Laterality: N/A;  pyloric   BIOPSY  03/08/2019   Procedure: BIOPSY;  Surgeon: Yetta Flock, MD;  Location: [facility];  Service: Gastroenterology;;   BIOPSY  03/12/2019   Procedure: BIOPSY;  Surgeon: Irving Copas., MD;  Location: [facility];  Service: Gastroenterology;;   CATARACT EXTRACTION     x 2   COLONOSCOPY     ENDOSCOPIC RETROGRADE CHOLANGIOPANCREATOGRAPHY (ERCP) WITH PROPOFOL N/A 09/09/2020   Procedure: ENDOSCOPIC RETROGRADE CHOLANGIOPANCREATOGRAPHY (ERCP) WITH PROPOFOL;  Surgeon: Irving Copas., MD;  Location: [facility];  Service: Gastroenterology;  Laterality: N/A;   ESOPHAGOGASTRODUODENOSCOPY N/A 03/08/2019   Procedure: ESOPHAGOGASTRODUODENOSCOPY (EGD);  Surgeon: Yetta Flock, MD;  Location: [facility];  Service: Gastroenterology;  Laterality: N/A;   ESOPHAGOGASTRODUODENOSCOPY (EGD) WITH PROPOFOL N/A 03/12/2019   Procedure: ESOPHAGOGASTRODUODENOSCOPY (EGD) WITH PROPOFOL;  Surgeon: Rush Landmark Telford Nab., MD;  [facility]: [facility];  Service: Gastroenterology;  Laterality: N/A;   FINE NEEDLE ASPIRATION  03/12/2019   Procedure: FINE NEEDLE ASPIRATION (FNA) LINEAR;  Surgeon: Irving Copas., MD;  Location: [facility];  Service: Gastroenterology;;   FOREIGN BODY REMOVAL  03/08/2019   Procedure: FOREIGN BODY REMOVAL;   Surgeon: Yetta Flock, MD;  Location: [facility];  Service: Gastroenterology;;   IR BILIARY DRAIN PLACEMENT WITH CHOLANGIOGRAM  09/09/2020   IR CHOLANGIOGRAM EXISTING TUBE  09/29/2020   IR CHOLANGIOGRAM EXISTING TUBE  09/29/2020   IR CHOLANGIOGRAM EXISTING TUBE  11/13/2020   IR CHOLANGIOGRAM EXISTING TUBE  11/13/2020   IR EXCHANGE BILIARY DRAIN  09/24/2020   IR EXCHANGE BILIARY DRAIN  10/30/2020   IR EXCHANGE BILIARY DRAIN  10/30/2020   IR INT EXT BILIARY DRAIN WITH CHOLANGIOGRAM  09/10/2020   IR PATIENT EVAL TECH 0-60 MINS  04/03/2019   IR PERC CHOLECYSTOSTOMY  09/24/2020   LAPAROTOMY N/A 03/19/2019   Procedure: EXPLORATORY LAPAROTOMY, distal GASTRECTOMY AND PLACEMENT OF G AND J TUBE, gastric jejunostomy;  Surgeon: Greer Pickerel, MD;  Location: [facility];  Service: General;  Laterality: N/A;   POLYPECTOMY     PORTACATH PLACEMENT N/A 03/26/2019   Procedure: INSERTION PORT-A-CATH;  Surgeon: Michael Boston, MD;  Location: [facility];  Service: General;  Laterality: N/A;   [place] INJECTION  09/09/2020   Procedure: SUBMUCOSAL TATTOO INJECTION;  Surgeon: Irving Copas., MD;  Location: [facility];  Service: Gastroenterology;;   UPPER ESOPHAGEAL ENDOSCOPIC ULTRASOUND (EUS) N/A 03/12/2019   Procedure: UPPER ESOPHAGEAL ENDOSCOPIC ULTRASOUND (EUS);  Surgeon: Irving Copas., MD;  Location: [facility];  Service: Gastroenterology;  Laterality: N/A;   UPPER GASTROINTESTINAL ENDOSCOPY     VIDEO BRONCHOSCOPY WITH ENDOBRONCHIAL ULTRASOUND N/A 05/22/2019   Procedure: VIDEO BRONCHOSCOPY WITH ENDOBRONCHIAL ULTRASOUND;  Surgeon: Garner Nash, DO;  Location: [facility];  Service: Thoracic;  Laterality: N/A;       Family History  Problem Relation Age of Onset   Cancer Mother    Hypertension Mother    Diabetes Sister    Cancer Brother        colon   Hypertension Brother    Hyperlipidemia Brother    Stroke Brother    Hypertension Sister    Colon cancer Neg Hx     Heart attack Neg Hx    Sudden death Neg Hx    Esophageal cancer Neg Hx    Pancreatic cancer Neg Hx    Stomach cancer Neg Hx    Rectal cancer Neg Hx     Social History   Tobacco Use   Smoking status: Former    Packs/day: 1.00    Years: 24.00    Pack years: 24.00    Types: Cigarettes    Quit date: 10/03/1978    Years since quitting: 42.2   Smokeless tobacco: Never  Vaping Use   Vaping Use: Never used  Substance Use Topics   Alcohol use: Not Currently   Drug use: No    Home Medications Prior to Admission medications   Medication Sig Start Date End Date Taking? Authorizing Provider  atorvastatin (LIPITOR) 20 MG tablet Take 20 mg by mouth at bedtime. 10/30/20   [provider]  dexamethasone (DECADRON) 4 MG tablet Take 2 tablets (8 mg total) by mouth daily. Start the day after carboplatin chemotherapy for 3 days. Patient taking differently: Take 8 mg by mouth See admin instructions. Take 8 mg by mouth daily- Start the day after carboplatin chemotherapy for 3 days. 11/16/20   Volanda Napoleon, MD  dorzolamide-timolol (COSOPT) 22.3-6.8 MG/ML ophthalmic solution Place 1 drop into both eyes 2 (two) times daily. 06/15/20   [provider]  famotidine (PEPCID) 20 MG tablet TAKE 1 TABLET BY MOUTH EVERY DAY Patient taking differently: Take 20 mg by mouth daily. 12/08/20   Volanda Napoleon, MD  latanoprost (XALATAN) 0.005 % ophthalmic solution Place 1 drop into both eyes at bedtime.  01/09/19   [provider]  lidocaine-prilocaine (EMLA) cream Apply 1 application topically as needed. Place on the port one hour before appointment. Patient taking differently: Apply 1 application topically as needed (as directed- Place on the port one hour before appointment). 08/24/20   Volanda Napoleon, MD  meclizine (ANTIVERT) 12.5 MG tablet TAKE 1 TABLET BY MOUTH 3 TIMES DAILY AS NEEDED FOR DIZZINESS. Patient not taking: Reported on 12/08/2020 08/10/20   Biagio Borg, MD  Multiple Vitamin  (MULTIVITAMIN WITH MINERALS) TABS tablet Take 1 tablet by mouth in the morning. Men's One-A-Day Chewable    [provider]  ondansetron (ZOFRAN) 8 MG tablet Take 1 tablet (8 mg total) by mouth every 8 (eight) hours as needed for nausea or vomiting. 09/16/20   Hayden Pedro, PA-C  potassium chloride (KLOR-CON) 20 MEQ packet Take 20 mEq by mouth 2 (two) times daily. 12/08/20 01/07/21  Isla Pence, MD  senna-docusate (SENOKOT S) 8.6-50 MG tablet Take 1 tablet by mouth daily as needed for mild constipation. 12/08/20   Isla Pence, MD  SYSTANE ULTRA 0.4-0.3 % SOLN Place 1 drop  into both eyes 3 (three) times daily as needed (dry eyes). 01/09/19   [provider]    Allergies    Patient has no known allergies.  Review of Systems   Review of Systems  Constitutional:  Positive for fever. Negative for appetite change and fatigue.  HENT:  Negative for congestion, ear discharge and sinus pressure.   Eyes:  Negative for discharge.  Respiratory:  Negative for cough.   Cardiovascular:  Negative for chest pain.  Gastrointestinal:  Negative for abdominal pain and diarrhea.       Constipation  Genitourinary:  Negative for frequency and hematuria.  Musculoskeletal:  Negative for back pain.  Skin:  Negative for rash.  Neurological:  Negative for seizures and headaches.  Psychiatric/Behavioral:  Negative for hallucinations.    Physical Exam Updated Vital Signs BP 100/77 (BP Location: Right Arm)   Pulse 80   Temp 98 F (36.7 C) (Oral)   Resp 16   Ht '5\' 8"'$  (1.727 m)   Wt 68 kg   SpO2 100%   BMI 22.79 kg/m   Physical Exam Vitals and nursing note reviewed.  Constitutional:      Appearance: He is well-developed.  HENT:     Head: Normocephalic.     Nose: Nose normal.  Eyes:     General: No scleral icterus.    Conjunctiva/sclera: Conjunctivae normal.  Neck:     Thyroid: No thyromegaly.  Cardiovascular:     Rate and Rhythm: Normal rate and regular rhythm.     Heart  sounds: No murmur heard.   No friction rub. No gallop.  Pulmonary:     Breath sounds: No stridor. No wheezing or rales.  Chest:     Chest wall: No tenderness.  Abdominal:     General: There is no distension.     Tenderness: There is no abdominal tenderness. There is no rebound.  Musculoskeletal:        General: Normal range of motion.     Cervical back: Neck supple.  Lymphadenopathy:     Cervical: No cervical adenopathy.  Skin:    Findings: No erythema or rash.  Neurological:     Mental Status: He is alert and oriented to person, place, and time.     Motor: No abnormal muscle tone.     Coordination: Coordination normal.  Psychiatric:        Behavior: Behavior normal.    ED Results / Procedures / Treatments   Labs (all labs ordered are listed, but only abnormal results are displayed) Labs Reviewed  COMPREHENSIVE METABOLIC PANEL - Abnormal; Notable for the following components:      Result Value   Sodium 130 (*)    Potassium 5.2 (*)    Glucose, Bld 118 (*)    Albumin 3.4 (*)    AST 76 (*)    ALT 125 (*)    Alkaline Phosphatase 211 (*)    All other components within normal limits  CBC WITH DIFFERENTIAL/PLATELET - Abnormal; Notable for the following components:   WBC 0.9 (*)    RBC 4.16 (*)    Hemoglobin 11.7 (*)    HCT 35.3 (*)    Neutro Abs 0.5 (*)    Lymphs Abs 0.3 (*)    Monocytes Absolute 0.0 (*)    All other components within normal limits  RESP PANEL BY RT-PCR (FLU A&B, COVID) ARPGX2  CULTURE, BLOOD (ROUTINE X 2)  CULTURE, BLOOD (ROUTINE X 2)  LACTIC ACID, PLASMA  PROTIME-INR  LACTIC  ACID, PLASMA  URINALYSIS, ROUTINE W REFLEX MICROSCOPIC    EKG None  Radiology DG Chest 2 View  Result Date: 12/10/2020 CLINICAL DATA:  Fever, sepsis EXAM: CHEST - 2 VIEW COMPARISON:  10/31/2020 FINDINGS: The heart size and mediastinal contours are within normal limits. Right chest port catheter. Both lungs are clear. Benign, calcified nodules of the right upper lobe. The  visualized skeletal structures are unremarkable. IMPRESSION: No acute abnormality of the lungs. Electronically Signed   By: Eddie Candle M.D.   On: 12/10/2020 13:43   CT Abdomen Pelvis W Contrast  Result Date: 12/08/2020 CLINICAL DATA:  Bowel obstruction suspected EXAM: CT ABDOMEN AND PELVIS WITH CONTRAST TECHNIQUE: Multidetector CT imaging of the abdomen and pelvis was performed using the standard protocol following bolus administration of intravenous contrast. CONTRAST:  45m OMNIPAQUE IOHEXOL 350 MG/ML SOLN COMPARISON:  11/21/2020 FINDINGS: Lower chest: No acute abnormality. Hepatobiliary: Cholecystostomy tube in place as well as left percutaneous transhepatic biliary drain. Mild intrahepatic biliary ductal dilatation. Small scattered hypodensities in the liver, likely small cysts. Pancreas: No focal abnormality or ductal dilatation. Spleen: No focal abnormality.  Normal size. Adrenals/Urinary Tract: No adrenal abnormality. No focal renal abnormality. No stones or hydronephrosis. Urinary bladder is unremarkable. Stomach/Bowel: Postoperative changes in the stomach. Moderate stool burden in the colon. Stomach, large and small bowel grossly unremarkable. Vascular/Lymphatic: Aorta normal caliber. No evidence of aneurysm or adenopathy. Reproductive: Prostate prominence with calcifications. Other: No free fluid or free air. Small peritoneal nodules are again seen in the left abdomen and right upper abdomen, unchanged. Musculoskeletal: No acute bony abnormality. IMPRESSION: Percutaneous cholecystostomy and percutaneous biliary drain remain in place, unchanged. Stable mild intrahepatic biliary ductal dilatation. Moderate stool burden throughout the colon. Stable evidence of peritoneal disease. No acute findings. Electronically Signed   By: KRolm BaptiseM.D.   On: 12/08/2020 19:06    Procedures Procedures   Medications Ordered in ED Medications  ondansetron (ZOFRAN-ODT) disintegrating tablet 4 mg (4 mg Oral  Given 12/10/20 1343)  sodium chloride 0.9 % bolus 2,000 mL (2,000 mLs Intravenous New Bag/Given 12/10/20 1449)  iohexol (OMNIPAQUE) 350 MG/ML injection 80 mL (80 mLs Intravenous Contrast Given 12/10/20 1614)    ED Course  I have reviewed the triage vital signs and the nursing notes.  Pertinent labs & imaging results that were available during my care of the patient were reviewed by me and considered in my medical decision making (see chart for details).  Patient with absolute neutrophil count of 500. MDM Rules/Calculators/A&P                          I spoke with oncology.  They recommended admission and starting the patient on cefepime because he is neutropenic has had a fever recently and had chemotherapy a week ago.  Oncology will see the patient tomorrow and he will be admitted to medicine for septic work-up  Final Clinical Impression(s) / ED Diagnoses Final diagnoses:  None    Rx / DC Orders ED Discharge Orders     None        ZMilton Ferguson MD 12/10/20 1713

## 2020-12-10 NOTE — H&P (Signed)
History and Physical    [name] [id number] DOB: 10-07-1940 DOA: 12/10/2020  I have briefly reviewed the patient's prior medical records in [place]  PCP: Biagio Borg, MD  Patient coming from: home  Chief Complaint: fever and constipation  HPI: Jonathan Lewis is a 80 y.o. male with medical history significant of stage IIIa adenocarcinoma of the stomach.  Patient had a distal gastrectomy on 03/2019.  He began treatment with FOLFOX via Dr. Marin Olp.  Patient then developed a biliary obstruction from a periportal lymph node.  He had radiation as well as drain placement. On 9/6 he was seen in the ER with constipation and drainage from his abdominal tubes.  Patient had a CT scan that showed constipation.  He was started on senna and MiraLAX.  He continued to have issues with constipation and then developed a fever. He recently finished cycle 8 of his chemotherapy. In the ER, his labs were reflective of neutropenia.  He had a CT scan that showed moderate stool burden and drains in place.  He was cultured and ER physician spoke with oncologist who suggested starting cefepime.  Oncology plans to see patient in the a.m.   Review of Systems: As per HPI otherwise 10 point review of systems negative.   Past Medical History:  Diagnosis Date   Acoustic neuroma ([place]) 06/25/2015   Allergic rhinitis 07/18/2014   Allergy    Anemia    Arthritis    BPH (benign prostatic hyperplasia)    Cancer ([place])    stage III stomach-Dx 03/2019   Carbuncle    recurrent MRSA carbuncles   Cataract    Per pt bilateral cataracts removed.   Diabetes mellitus    Type II. Per pt Dr. Jenny Reichmann took him off his dm med.   Disk prolapse    Glaucoma    Hyperlipidemia    Hypertension    Male hypogonadism 07/16/2014   Neuropathy    Pneumonia    Sinus bradycardia    chronic, asymptomatic   Sinusitis 07/29/2012    Past Surgical History:  Procedure Laterality Date   ANTERIOR CERVICAL  DECOMPRESSION/DISCECTOMY FUSION 4 LEVELS N/A 01/08/2020   Procedure: ANTERIOR CERVICAL DECOMPRESSION FUSION CERVICAL 3-4, CERVICAL 4-5, CERVIAL 5-6 WITH INSTRUMENTATION AND ALLOGRAFT;  Surgeon: Phylliss Bob, MD;  Location: [facility];  Service: Orthopedics;  Laterality: N/A;   BALLOON DILATION N/A 03/12/2019   Procedure: BALLOON DILATION;  Surgeon: Rush Landmark Telford Nab., MD;  Location[facility];  Service: Gastroenterology;  Laterality: N/A;  pyloric   BIOPSY  03/08/2019   Procedure: BIOPSY;  Surgeon: Yetta Flock, MD;  Location: [facility];  Service: Gastroenterology;;   BIOPSY  03/12/2019   Procedure: BIOPSY;  Surgeon: Irving Copas., MD;  Location: [facility];  Service: Gastroenterology;;   CATARACT EXTRACTION     x 2   COLONOSCOPY     ENDOSCOPIC RETROGRADE CHOLANGIOPANCREATOGRAPHY (ERCP) WITH PROPOFOL N/A 09/09/2020   Procedure: ENDOSCOPIC RETROGRADE CHOLANGIOPANCREATOGRAPHY (ERCP) WITH PROPOFOL;  Surgeon: Irving Copas., MD;  Location: [facility];  Service: Gastroenterology;  Laterality: N/A;   ESOPHAGOGASTRODUODENOSCOPY N/A 03/08/2019   Procedure: ESOPHAGOGASTRODUODENOSCOPY (EGD);  Surgeon: Yetta Flock, MD;  Location: [facility];  Service: Gastroenterology;  Laterality: N/A;   ESOPHAGOGASTRODUODENOSCOPY (EGD) WITH PROPOFOL N/A 03/12/2019   Procedure: ESOPHAGOGASTRODUODENOSCOPY (EGD) WITH PROPOFOL;  Surgeon: Rush Landmark Telford Nab., MD;  [facility];  Service: Gastroenterology;  Laterality: N/A;   FINE NEEDLE ASPIRATION  03/12/2019   Procedure: FINE NEEDLE ASPIRATION (FNA) LINEAR;  Surgeon: Irving Copas., MD;  Location: [facility];  Service: Gastroenterology;;   FOREIGN BODY REMOVAL  03/08/2019   Procedure: FOREIGN BODY REMOVAL;  Surgeon: Yetta Flock, MD;  [facility];  Service: Gastroenterology;;   IR BILIARY DRAIN PLACEMENT WITH CHOLANGIOGRAM  09/09/2020   IR CHOLANGIOGRAM EXISTING TUBE  09/29/2020   IR  CHOLANGIOGRAM EXISTING TUBE  09/29/2020   IR CHOLANGIOGRAM EXISTING TUBE  11/13/2020   IR CHOLANGIOGRAM EXISTING TUBE  11/13/2020   IR EXCHANGE BILIARY DRAIN  09/24/2020   IR EXCHANGE BILIARY DRAIN  10/30/2020   IR EXCHANGE BILIARY DRAIN  10/30/2020   IR INT EXT BILIARY DRAIN WITH CHOLANGIOGRAM  09/10/2020   IR PATIENT EVAL TECH 0-60 MINS  04/03/2019   IR PERC CHOLECYSTOSTOMY  09/24/2020   LAPAROTOMY N/A 03/19/2019   Procedure: EXPLORATORY LAPAROTOMY, distal GASTRECTOMY AND PLACEMENT OF G AND J TUBE, gastric jejunostomy;  Surgeon: Greer Pickerel, MD;  Location: [facility];  Service: General;  Laterality: N/A;   POLYPECTOMY     PORTACATH PLACEMENT N/A 03/26/2019   Procedure: INSERTION PORT-A-CATH;  Surgeon: Michael Boston, MD;  Location: [facility];  Service: General;  Laterality: N/A;   [place] INJECTION  09/09/2020   Procedure: SUBMUCOSAL TATTOO INJECTION;  Surgeon: Irving Copas., MD;  Location: [facility];  Service: Gastroenterology;;   UPPER ESOPHAGEAL ENDOSCOPIC ULTRASOUND (EUS) N/A 03/12/2019   Procedure: UPPER ESOPHAGEAL ENDOSCOPIC ULTRASOUND (EUS);  Surgeon: Irving Copas., MD;  Location: [facility];  Service: Gastroenterology;  Laterality: N/A;   UPPER GASTROINTESTINAL ENDOSCOPY     VIDEO BRONCHOSCOPY WITH ENDOBRONCHIAL ULTRASOUND N/A 05/22/2019   Procedure: VIDEO BRONCHOSCOPY WITH ENDOBRONCHIAL ULTRASOUND;  Surgeon: Garner Nash, DO;  Location: [facility];  Service: Thoracic;  Laterality: N/A;     reports that he quit smoking about 42 years ago. His smoking use included cigarettes. He has a 24.00 pack-year smoking history. He has never used smokeless tobacco. He reports that he does not currently use alcohol. He reports that he does not use drugs.  No Known Allergies  Family History  Problem Relation Age of Onset   Cancer Mother    Hypertension Mother    Diabetes Sister    Cancer Brother        colon   Hypertension Brother    Hyperlipidemia  Brother    Stroke Brother    Hypertension Sister    Colon cancer Neg Hx    Heart attack Neg Hx    Sudden death Neg Hx    Esophageal cancer Neg Hx    Pancreatic cancer Neg Hx    Stomach cancer Neg Hx    Rectal cancer Neg Hx     Prior to Admission medications   Medication Sig Start Date End Date Taking? Authorizing Provider  atorvastatin (LIPITOR) 20 MG tablet Take 20 mg by mouth at bedtime. 10/30/20  Yes [provider]  dexamethasone (DECADRON) 4 MG tablet Take 2 tablets (8 mg total) by mouth daily. Start the day after carboplatin chemotherapy for 3 days. Patient taking differently: Take 8 mg by mouth See admin instructions. Take 8 mg by mouth daily- Start the day after carboplatin chemotherapy for 3 days. 11/16/20  Yes Volanda Napoleon, MD  dorzolamide-timolol (COSOPT) 22.3-6.8 MG/ML ophthalmic solution Place 1 drop into both eyes 2 (two) times daily. 06/15/20  Yes [provider]  famotidine (PEPCID) 20 MG tablet TAKE 1 TABLET BY MOUTH EVERY DAY Patient taking differently: Take 20 mg by  mouth daily. 12/08/20  Yes Ennever, Rudell Cobb, MD  latanoprost (XALATAN) 0.005 % ophthalmic solution Place 1 drop into both eyes at bedtime.  01/09/19  Yes [provider]  lidocaine-prilocaine (EMLA) cream Apply 1 application topically as needed. Place on the port one hour before appointment. Patient taking differently: Apply 1 application topically daily as needed (as directed- Place on the port one hour before appointment). 08/24/20  Yes Volanda Napoleon, MD  meclizine (ANTIVERT) 12.5 MG tablet TAKE 1 TABLET BY MOUTH 3 TIMES DAILY AS NEEDED FOR DIZZINESS. Patient taking differently: Take 12.5 mg by mouth daily as needed for dizziness. 08/10/20  Yes Biagio Borg, MD  Multiple Vitamin (MULTIVITAMIN WITH MINERALS) TABS tablet Take 1 tablet by mouth in the morning. Men's One-A-Day Chewable   Yes [provider]  ondansetron (ZOFRAN) 8 MG tablet Take 1 tablet (8 mg total) by mouth  every 8 (eight) hours as needed for nausea or vomiting. 09/16/20  Yes Hayden Pedro, PA-C  potassium chloride (KLOR-CON) 20 MEQ packet Take 20 mEq by mouth 2 (two) times daily. 12/08/20 01/07/21 Yes Isla Pence, MD  senna-docusate (SENOKOT S) 8.6-50 MG tablet Take 1 tablet by mouth daily as needed for mild constipation. 12/08/20  Yes Isla Pence, MD  SYSTANE ULTRA 0.4-0.3 % SOLN Place 1 drop into both eyes 3 (three) times daily as needed (dry eyes). 01/09/19  Yes [provider]    Physical Exam: Vitals:   12/10/20 1428 12/10/20 1430 12/10/20 1642 12/10/20 1700  BP: 99/69 97/74 100/77 100/78  Pulse: (!) 101 88 80 60  Resp: '17 16 16 16  '$ Temp: 98 F (36.7 C)   99 F (37.2 C)  TempSrc: Oral   Oral  SpO2: 98% 99% 100% 100%  Weight:      Height:          Constitutional: Thin male.  NAD, calm, comfortable Neck: normal, supple, no masses, no thyromegaly Respiratory: clear to auscultation bilaterally, no wheezing, no crackles. Normal respiratory effort. No accessory muscle use.  Cardiovascular: Regular rate and rhythm, no murmurs / rubs / gallops. No extremity edema. 2+ pedal pulses.  Abdomen: Diminished bowel sounds, 2 drains in place with no signs of cellulitis.  No tenderness around the drains.  Minimal tenderness in the abdomen with palpation Musculoskeletal: no clubbing / cyanosis. Normal muscle tone.  Skin: no rashes, lesions, ulcers. No induration Neurologic: CN 2-12 grossly intact. Strength 5/5 in all 4.  Psychiatric: Normal judgment and insight. Alert and oriented x 3. Normal mood.   Labs on Admission: I have personally reviewed following labs and imaging studies  CBC: Recent Labs  Lab 12/08/20 1631 12/10/20 1254  WBC 1.4* 0.9*  NEUTROABS 0.9* 0.5*  HGB 11.2* 11.7*  HCT 33.9* 35.3*  MCV 85.8 84.9  PLT 198 [id number]   Basic Metabolic Panel: Recent Labs  Lab 12/08/20 1631 12/10/20 1254  NA 130* 130*  K 4.7 5.2*  CL 100 101  CO2 24 22  GLUCOSE 127*  118*  BUN 18 15  CREATININE 0.72 0.69  CALCIUM 8.7* 9.1   GFR: Estimated Creatinine Clearance: 69.7 mL/min (by C-G formula based on SCr of 0.69 mg/dL). Liver Function Tests: Recent Labs  Lab 12/08/20 1631 12/10/20 1254  AST 62* 76*  ALT 88* 125*  ALKPHOS 193* 211*  BILITOT 0.9 1.1  PROT 7.1 7.9  ALBUMIN 3.0* 3.4*   Recent Labs  Lab 12/08/20 1631  LIPASE 29   No results for input(s): AMMONIA in the  last 168 hours. Coagulation Profile: Recent Labs  Lab 12/10/20 1254  INR 1.2   Cardiac Enzymes: No results for input(s): CKTOTAL, CKMB, CKMBINDEX, TROPONINI in the last 168 hours. BNP (last 3 results) No results for input(s): PROBNP in the last 8760 hours. HbA1C: No results for input(s): HGBA1C in the last 72 hours. CBG: No results for input(s): GLUCAP in the last 168 hours. Lipid Profile: No results for input(s): CHOL, HDL, LDLCALC, TRIG, CHOLHDL, LDLDIRECT in the last 72 hours. Thyroid Function Tests: No results for input(s): TSH, T4TOTAL, FREET4, T3FREE, THYROIDAB in the last 72 hours. Anemia Panel: No results for input(s): VITAMINB12, FOLATE, FERRITIN, TIBC, IRON, RETICCTPCT in the last 72 hours. Urine analysis:    Component Value Date/Time   COLORURINE YELLOW 12/10/2020 1700   APPEARANCEUR CLEAR 12/10/2020 1700   LABSPEC 1.009 12/10/2020 1700   PHURINE 5.0 12/10/2020 1700   GLUCOSEU NEGATIVE 12/10/2020 1700   GLUCOSEU NEGATIVE 04/22/2020 1246   HGBUR NEGATIVE 12/10/2020 1700   HGBUR negative 11/30/2009 1450   BILIRUBINUR NEGATIVE 12/10/2020 1700   KETONESUR NEGATIVE 12/10/2020 1700   PROTEINUR NEGATIVE 12/10/2020 1700   UROBILINOGEN 0.2 04/22/2020 1246   NITRITE NEGATIVE 12/10/2020 1700   LEUKOCYTESUR NEGATIVE 12/10/2020 1700     Radiological Exams on Admission: DG Chest 2 View  Result Date: 12/10/2020 CLINICAL DATA:  Fever, sepsis EXAM: CHEST - 2 VIEW COMPARISON:  10/31/2020 FINDINGS: The heart size and mediastinal contours are within normal limits.  Right chest port catheter. Both lungs are clear. Benign, calcified nodules of the right upper lobe. The visualized skeletal structures are unremarkable. IMPRESSION: No acute abnormality of the lungs. Electronically Signed   By: Eddie Candle M.D.   On: 12/10/2020 13:43   CT ABDOMEN PELVIS W CONTRAST  Result Date: 12/10/2020 CLINICAL DATA:  Fever, currently being treated for metastatic gastric cancer EXAM: CT ABDOMEN AND PELVIS WITH CONTRAST TECHNIQUE: Multidetector CT imaging of the abdomen and pelvis was performed using the standard protocol following bolus administration of intravenous contrast. CONTRAST:  48m OMNIPAQUE IOHEXOL 350 MG/ML SOLN COMPARISON:  CT abdomen/pelvis 12/08/2020 FINDINGS: Lower chest: The lung bases are clear. The imaged heart is unremarkable. Hepatobiliary: A cholecystostomy tube is in place coiled in the gallbladder lumen. A percutaneous transhepatic biliary drain is also in place coiled in the duodenum. The position of the drain is unchanged. Mild intrahepatic biliary ductal dilatation is unchanged, with no progressive biliary ductal dilatation. A hypodense lesion in the right hepatic lobe near the dome is unchanged. An additional hypodense lesion in the inferior liver and segment IV B is unchanged going back to 09/21/2020. No new focal lesion is seen. There is no evidence of hepatic abscess. Pancreas: There is no focal lesion. There is no main pancreatic ductal dilatation or peripancreatic inflammatory change. Spleen: Unremarkable. Adrenals/Urinary Tract: The adrenals are unremarkable. There is a 5 mm nonobstructing right lower pole renal stone, unchanged. There are no other focal lesions or stones. There is no hydronephrosis or hydroureter. The bladder wall is mildly thickened. Stomach/Bowel: Postsurgical changes in the stomach are again seen. There is no evidence of complication at the suture site. There is no evidence of bowel obstruction. There is moderate stool throughout the  colon, similar to the prior study. There is no abnormal bowel wall thickening or inflammatory change. The appendix is normal. Vascular: The abdominal aorta is nonaneurysmal. The major branch vessels are patent. The main portal and splenic veins are patent. Lymphatic/peritoneum: There is no abdominal or pelvic lymphadenopathy. Multiple soft tissue  nodules are again seen in the mesenteric fat throughout the abdomen, for example in the right hemiabdomen measuring 1.1 cm (2-33), 0.6 cm (2-37), and in the left hemiabdomen measuring 1.3 cm (2-35) and 1.5 cm more inferiorly (2-38). A 1.5 cm lesion in the pelvis just anterior to the rectum is unchanged (2-66). These lesions are overall similar to the study from 09/21/2020. Reproductive: The prostate is prominent with scattered calcifications, unchanged. The seminal vesicles are unremarkable. Other: There is no ascites or free air. There is no intra-abdominal abscess identified. Musculoskeletal: There is no acute osseous abnormality or aggressive osseous lesion. There are bilateral L5-S1 pars defects, unchanged. IMPRESSION: 1. Overall no significant interval change since 12/08/2020. 2. Stable position of the cholecystostomy tube and percutaneous transhepatic biliary drain. 3. Unchanged intrahepatic biliary ductal dilatation with no new or progressive dilatation. 4. Multiple peritoneal metastatic deposits are unchanged. 5. Stable hypodense lesion in hepatic segment IVB. Electronically Signed   By: Valetta Mole M.D.   On: 12/10/2020 16:56   CT Abdomen Pelvis W Contrast  Result Date: 12/08/2020 CLINICAL DATA:  Bowel obstruction suspected EXAM: CT ABDOMEN AND PELVIS WITH CONTRAST TECHNIQUE: Multidetector CT imaging of the abdomen and pelvis was performed using the standard protocol following bolus administration of intravenous contrast. CONTRAST:  38m OMNIPAQUE IOHEXOL 350 MG/ML SOLN COMPARISON:  11/21/2020 FINDINGS: Lower chest: No acute abnormality. Hepatobiliary:  Cholecystostomy tube in place as well as left percutaneous transhepatic biliary drain. Mild intrahepatic biliary ductal dilatation. Small scattered hypodensities in the liver, likely small cysts. Pancreas: No focal abnormality or ductal dilatation. Spleen: No focal abnormality.  Normal size. Adrenals/Urinary Tract: No adrenal abnormality. No focal renal abnormality. No stones or hydronephrosis. Urinary bladder is unremarkable. Stomach/Bowel: Postoperative changes in the stomach. Moderate stool burden in the colon. Stomach, large and small bowel grossly unremarkable. Vascular/Lymphatic: Aorta normal caliber. No evidence of aneurysm or adenopathy. Reproductive: Prostate prominence with calcifications. Other: No free fluid or free air. Small peritoneal nodules are again seen in the left abdomen and right upper abdomen, unchanged. Musculoskeletal: No acute bony abnormality. IMPRESSION: Percutaneous cholecystostomy and percutaneous biliary drain remain in place, unchanged. Stable mild intrahepatic biliary ductal dilatation. Moderate stool burden throughout the colon. Stable evidence of peritoneal disease. No acute findings. Electronically Signed   By: KRolm BaptiseM.D.   On: 12/08/2020 19:06      Assessment/Plan Active Problems:   Constipation   Weight loss   Gastric cancer pT3pN2 s/p distal gastrectomy with Billroth II gastrojejunostomy 03/19/2019   Hyponatremia   Neutropenic fever (HCC)   Hyperkalemia    Neutropenic fever -ER MD discussed with oncology- given maxipime and granix -cultures of blood pending -U/A, x ray unremarkable -CT scan unremarkable for new findings -will not culture drains as in > 30 days -follows outpatient with IR -oncology consult in AM  Gastric cancer -oncology consult  Constipation -bowel regimen  Hyponatremia -IVF -labs in AM  Hyperkalemia -lokelma x 1 -labs in AM  Weight loss -nutrition consult   DVT prophylaxis: Lovenox Code Status: DNR as per prior  hospitalizations as well as patient's wish Family Communication: None at bedside Disposition Plan: Admit to the hospital Consults called: Oncology, plans to see in the a.m.   Admission status: inpt    At the time of admission, it appears that the appropriate admission status for this patient is INPATIENT. This is judged to be reasonable and necessary in order to provide the required high service intensity to ensure the patient's safety given the presenting symptoms,  physical exam findings, and initial radiographic and laboratory data in the context of their chronic comorbidities. Current circumstances are neutropenic fever, and it is felt to place patient at high risk for further clinical deterioration threatening life, limb, or organ. Moreover, it is my clinical judgment that the patient will require inpatient hospital care spanning beyond 2 midnights from the point of admission and that early discharge would result in unnecessary risk of decompensation and readmission or threat to life, limb or bodily function.   Geradine Girt [facility]   How to contact the [facility] Attending or Consulting provider [place] or covering provider during after hours [place], for this patient?  Check the care team in [facility] and look for a) attending/consulting [facility] provider listed and b) the [facility] team listed Log into [email or web address] and use [place]'s universal password to access. If you do not have the password, please contact the hospital operator. Locate the [facility] provider you are looking for under [facility] and page to a number that you can be directly reached. If you still have difficulty reaching the provider, please page the [facility] (Director on Call) for the Hospitalists listed on amion for assistance.  12/10/2020, 6:01 PM

## 2020-12-10 NOTE — ED Triage Notes (Signed)
Pt sent by MD for fever this am. Patient has abdominal drains x1 month. Pt also reports problems with constipation

## 2020-12-11 DIAGNOSIS — C161 Malignant neoplasm of fundus of stomach: Secondary | ICD-10-CM | POA: Diagnosis not present

## 2020-12-11 DIAGNOSIS — R5081 Fever presenting with conditions classified elsewhere: Secondary | ICD-10-CM | POA: Diagnosis not present

## 2020-12-11 DIAGNOSIS — C169 Malignant neoplasm of stomach, unspecified: Secondary | ICD-10-CM

## 2020-12-11 DIAGNOSIS — E871 Hypo-osmolality and hyponatremia: Secondary | ICD-10-CM | POA: Diagnosis not present

## 2020-12-11 DIAGNOSIS — D709 Neutropenia, unspecified: Secondary | ICD-10-CM | POA: Diagnosis not present

## 2020-12-11 DIAGNOSIS — R509 Fever, unspecified: Secondary | ICD-10-CM | POA: Diagnosis not present

## 2020-12-11 LAB — CBC
HCT: 28.2 % — ABNORMAL LOW (ref 39.0–52.0)
Hemoglobin: 9.5 g/dL — ABNORMAL LOW (ref 13.0–17.0)
MCH: 28.4 pg (ref 26.0–34.0)
MCHC: 33.7 g/dL (ref 30.0–36.0)
MCV: 84.4 fL (ref 80.0–100.0)
Platelets: 155 10*3/uL (ref 150–400)
RBC: 3.34 MIL/uL — ABNORMAL LOW (ref 4.22–5.81)
RDW: 13.7 % (ref 11.5–15.5)
WBC: 0.6 10*3/uL — CL (ref 4.0–10.5)
nRBC: 0 % (ref 0.0–0.2)

## 2020-12-11 LAB — COMPREHENSIVE METABOLIC PANEL
ALT: 113 U/L — ABNORMAL HIGH (ref 0–44)
AST: 66 U/L — ABNORMAL HIGH (ref 15–41)
Albumin: 2.6 g/dL — ABNORMAL LOW (ref 3.5–5.0)
Alkaline Phosphatase: 164 U/L — ABNORMAL HIGH (ref 38–126)
Anion gap: 7 (ref 5–15)
BUN: 12 mg/dL (ref 8–23)
CO2: 21 mmol/L — ABNORMAL LOW (ref 22–32)
Calcium: 8.4 mg/dL — ABNORMAL LOW (ref 8.9–10.3)
Chloride: 104 mmol/L (ref 98–111)
Creatinine, Ser: 0.62 mg/dL (ref 0.61–1.24)
GFR, Estimated: >60 mL/min (ref >60)
Glucose, Bld: 91 mg/dL (ref 70–99)
Potassium: 4.2 mmol/L (ref 3.5–5.1)
Sodium: 132 mmol/L — ABNORMAL LOW (ref 135–145)
Total Bilirubin: 1.2 mg/dL (ref 0.3–1.2)
Total Protein: 6.3 g/dL — ABNORMAL LOW (ref 6.5–8.1)

## 2020-12-11 LAB — PROTIME-INR
INR: 1.3 — ABNORMAL HIGH (ref 0.8–1.2)
Prothrombin Time: 15.8 seconds — ABNORMAL HIGH (ref 11.4–15.2)

## 2020-12-11 LAB — PROCALCITONIN: Procalcitonin: 0.15 ng/mL

## 2020-12-11 LAB — CORTISOL-AM, BLOOD: Cortisol - AM: 19 ug/dL (ref 6.7–22.6)

## 2020-12-11 MED ORDER — ENSURE ENLIVE PO LIQD
237.0000 mL | Freq: Three times a day (TID) | ORAL | Status: DC
  Administered 2020-12-11 – 2020-12-14 (×10): 237 mL via ORAL

## 2020-12-11 MED ORDER — DORZOLAMIDE HCL-TIMOLOL MAL 2-0.5 % OP SOLN
1.0000 [drp] | Freq: Two times a day (BID) | OPHTHALMIC | Status: DC
  Administered 2020-12-12 – 2020-12-14 (×5): 1 [drp] via OPHTHALMIC

## 2020-12-11 MED ORDER — CHLORHEXIDINE GLUCONATE CLOTH 2 % EX PADS
6.0000 | MEDICATED_PAD | Freq: Every day | CUTANEOUS | Status: DC
  Administered 2020-12-11 – 2020-12-14 (×4): 6 via TOPICAL

## 2020-12-11 MED ORDER — ACETAMINOPHEN 325 MG PO TABS: 650.0000 mg | ORAL_TABLET | Freq: Four times a day (QID) | ORAL | Status: DC | PRN

## 2020-12-11 MED ORDER — ONDANSETRON HCL 4 MG PO TABS: 4.0000 mg | ORAL_TABLET | Freq: Four times a day (QID) | ORAL | Status: DC | PRN

## 2020-12-11 MED ORDER — LACTATED RINGERS IV BOLUS
500.0000 mL | Freq: Once | INTRAVENOUS | Status: AC
  Administered 2020-12-11: 500 mL via INTRAVENOUS

## 2020-12-11 MED ORDER — ADULT MULTIVITAMIN W/MINERALS CH
1.0000 | ORAL_TABLET | Freq: Every day | ORAL | Status: DC
  Administered 2020-12-11 – 2020-12-14 (×4): 1 via ORAL
  Filled 2020-12-11 (×4): qty 1

## 2020-12-11 MED ORDER — FAMOTIDINE 20 MG PO TABS
40.0000 mg | ORAL_TABLET | Freq: Two times a day (BID) | ORAL | Status: DC
  Administered 2020-12-11 – 2020-12-14 (×7): 40 mg via ORAL
  Filled 2020-12-11 (×7): qty 2

## 2020-12-11 MED ORDER — ACETAMINOPHEN 650 MG RE SUPP: 650.0000 mg | Freq: Four times a day (QID) | RECTAL | Status: DC | PRN

## 2020-12-11 MED ORDER — ENOXAPARIN SODIUM 40 MG/0.4ML IJ SOSY
40.0000 mg | PREFILLED_SYRINGE | INTRAMUSCULAR | Status: DC
  Administered 2020-12-11 – 2020-12-13 (×3): 40 mg via SUBCUTANEOUS
  Filled 2020-12-11 (×4): qty 0.4

## 2020-12-11 MED ORDER — SODIUM CHLORIDE 0.9 % IV SOLN: INTRAVENOUS | Status: DC

## 2020-12-11 MED ORDER — SODIUM CHLORIDE 0.9 % IV SOLN
2.0000 g | Freq: Three times a day (TID) | INTRAVENOUS | Status: DC
  Administered 2020-12-11 – 2020-12-14 (×10): 2 g via INTRAVENOUS
  Filled 2020-12-11 (×10): qty 2

## 2020-12-11 MED ORDER — ONDANSETRON HCL 4 MG/2ML IJ SOLN: 4.0000 mg | Freq: Four times a day (QID) | INTRAMUSCULAR | Status: DC | PRN

## 2020-12-11 MED ORDER — TRAMADOL HCL 50 MG PO TABS
50.0000 mg | ORAL_TABLET | Freq: Four times a day (QID) | ORAL | Status: DC | PRN
  Administered 2020-12-12 – 2020-12-13 (×4): 50 mg via ORAL
  Filled 2020-12-11 (×4): qty 1

## 2020-12-11 NOTE — Progress Notes (Signed)
PROGRESS NOTE    MITESH MATTERN  [id number] DOB: 01/16/1941 DOA: 12/10/2020 PCP: Biagio Borg, MD    Chief Complaint  Patient presents with   Fever    Brief Narrative:  Jonathan Lewis is a 80 y.o. male with medical history significant of stage IIIa adenocarcinoma of the stomach.  Patient had a distal gastrectomy on 03/2019.  He began treatment with FOLFOX via Dr. Marin Olp.  Patient then developed a biliary obstruction from a periportal lymph node.  He had radiation as well as drain placement. He presents to ED with fever. CT of the abdomen pelvis shows  Unchanged intrahepatic biliary ductal dilatation with no new or progressive dilatation. Multiple peritoneal metastatic deposits are unchanged. Stable hypodense lesion in hepatic segment IVB. He was admitted for evaluation of neutropenic fevers. Patient seen and examined this morning he reports he feels fine denies any abdominal pain at this time. Assessment & Plan:   Active Problems:   Constipation   Weight loss   Gastric cancer pT3pN2 s/p distal gastrectomy with Billroth II gastrojejunostomy 03/19/2019   Hyponatremia   Neutropenic fever (HCC)   Hyperkalemia   Neutropenic fever S/p Granix injection WBC count around 600 this morning.  Repeat absolute neutrophil count in the morning. CT of the abdomen and pelvis does not show any new lesions at this time. Monitor the patient empirically on IV cefepime, continue with IV fluids at this time. Follow blood cultures.   Gastric cancer s/p gastrectomy, s/p chemotherapy.  Oncology on board and appreciate recommendations.   Constipation Continue with Senokot.   Mild hyponatremia Continue to monitor.    Anemia of chronic disease Baseline hemoglobin between 9-10 Hemoglobin around 9.5 continue to monitor.   DVT prophylaxis: (Lovenox) Code Status: DNR Family Communication: none at bedside.  Disposition:   Status is: Inpatient  Remains inpatient appropriate  because:Unsafe d/c plan and IV treatments appropriate due to intensity of illness or inability to take PO  Dispo: The patient is from: Home              Anticipated d/c is to:  pending.              Patient currently is not medically stable to d/c.   Difficult to place patient No       Consultants:  Oncology  Procedures: none.   Antimicrobials:  Antibiotics Given (last 72 hours)     Date/Time Action Medication Dose Rate   12/10/20 1817 New Bag/Given   ceFEPIme (MAXIPIME) 2 g in sodium chloride 0.9 % 100 mL IVPB 2 g 200 mL/hr   12/11/20 0057 New Bag/Given   ceFEPIme (MAXIPIME) 2 g in sodium chloride 0.9 % 100 mL IVPB 2 g 200 mL/hr   12/11/20 1014 New Bag/Given   ceFEPIme (MAXIPIME) 2 g in sodium chloride 0.9 % 100 mL IVPB 2 g 200 mL/hr         Subjective: REPORTS FEELING FINE .   Objective: Vitals:   12/11/20 0545 12/11/20 0820 12/11/20 1035 12/11/20 1219  BP: 98/75 (!) 80/64 100/74 119/81  Pulse: 89 99 84 72  Resp: '16 18 16 16  '$ Temp: 98.2 F (36.8 C) 98 F (36.7 C) 97.7 F (36.5 C) 97.7 F (36.5 C)  TempSrc: Oral  Oral Oral  SpO2: 100% 100% 100% 98%  Weight:      Height:        Intake/Output Summary (Last 24 hours) at 12/11/2020 1402 Last data filed at 12/11/2020 0218 Gross per 24 hour  Intake --  Output 300 ml  Net -300 ml   Filed Weights   12/10/20 1245 12/10/20 2100  Weight: 68 kg 67.5 kg    Examination:  General exam: Appears calm and comfortable  Respiratory system: Clear to auscultation. Respiratory effort normal. Cardiovascular system: S1 & S2 heard, RRR. No JVD, . No pedal edema. Gastrointestinal system: Abdomen is nondistended, soft and nontender.. Normal bowel sounds heard. Central nervous system: Alert and oriented. No focal neurological deficits. Extremities: Symmetric 5 x 5 power. Skin: No rashes, lesions or ulcers Psychiatry:. Mood & affect appropriate.     Data Reviewed: I have personally reviewed following labs and imaging  studies  CBC: Recent Labs  Lab 12/08/20 1631 12/10/20 1254 12/11/20 0500  WBC 1.4* 0.9* 0.6*  NEUTROABS 0.9* 0.5*  --   HGB 11.2* 11.7* 9.5*  HCT 33.9* 35.3* 28.2*  MCV 85.8 84.9 84.4  PLT 198 218 [id number]    Basic Metabolic Panel: Recent Labs  Lab 12/08/20 1631 12/10/20 1254 12/11/20 0500  NA 130* 130* 132*  K 4.7 5.2* 4.2  CL 100 101 104  CO2 24 22 21*  GLUCOSE 127* 118* 91  BUN '18 15 12  '$ CREATININE 0.72 0.69 0.62  CALCIUM 8.7* 9.1 8.4*    GFR: Estimated Creatinine Clearance: 69.1 mL/min (by C-G formula based on SCr of 0.62 mg/dL).  Liver Function Tests: Recent Labs  Lab 12/08/20 1631 12/10/20 1254 12/11/20 0500  AST 62* 76* 66*  ALT 88* 125* 113*  ALKPHOS 193* 211* 164*  BILITOT 0.9 1.1 1.2  PROT 7.1 7.9 6.3*  ALBUMIN 3.0* 3.4* 2.6*    CBG: No results for input(s): GLUCAP in the last 168 hours.   Recent Results (from the past 240 hour(s))  Culture, blood (Routine x 2)     Status: None (Preliminary result)   Collection Time: 12/10/20 12:54 PM   Specimen: BLOOD  Result Value Ref Range Status   Specimen Description   Final    BLOOD LEFT ANTECUBITAL Performed at [facility] [address], [place], [place] [address]    Special Requests   Final    BOTTLES DRAWN AEROBIC AND ANAEROBIC Blood Culture adequate volume Performed at [facility] [address] [place], [place] [address]    Culture   Final    NO GROWTH < 24 HOURS Performed at [facility] [address], [place], [place] [address]    Report Status PENDING  Incomplete  Culture, blood (Routine x 2)     Status: None (Preliminary result)   Collection Time: 12/10/20  1:40 PM   Specimen: BLOOD  Result Value Ref Range Status   Specimen Description   Final    BLOOD LEFT ANTECUBITAL Performed at [facility] [address], [place], [place] [address]    Special Requests   Final    BOTTLES DRAWN AEROBIC AND ANAEROBIC Blood  Culture results may not be optimal due to an inadequate volume of blood received in culture bottles Performed at [facility] [address] [place], [place] [address]    Culture   Final    NO GROWTH < 24 HOURS Performed at [facility] [address], [place], [place] [address]    Report Status PENDING  Incomplete  Resp Panel by RT-PCR (Flu A&B, Covid) Nasopharyngeal Swab     Status: None   Collection Time: 12/10/20  1:54 PM   Specimen: Nasopharyngeal Swab; Nasopharyngeal(NP) swabs in vial  transport medium  Result Value Ref Range Status   SARS Coronavirus 2 by RT PCR NEGATIVE NEGATIVE Final    Comment: (NOTE) SARS-CoV-2 target nucleic acids are NOT DETECTED.  The SARS-CoV-2 RNA is generally detectable in upper respiratory specimens during the acute phase of infection. The lowest concentration of SARS-CoV-2 viral copies this assay can detect is 138 copies/mL. A negative result does not preclude SARS-Cov-2 infection and should not be used as the sole basis for treatment or other patient management decisions. A negative result may occur with  improper specimen collection/handling, submission of specimen other than nasopharyngeal swab, presence of viral mutation(s) within the areas targeted by this assay, and inadequate number of viral copies(<138 copies/mL). A negative result must be combined with clinical observations, patient history, and epidemiological information. The expected result is Negative.  Fact Sheet for Patients:  EntrepreneurPulse.com.au  Fact Sheet for Healthcare Providers:  IncredibleEmployment.be  This test is no t yet approved or cleared by the [place] FDA and  has been authorized for detection and/or diagnosis of SARS-CoV-2 by FDA under an Emergency Use Authorization (EUA). This EUA will remain  in effect (meaning this test can be used) for the duration of the COVID-19 declaration under  Section 564(b)(1) of the Act, 21 U.S.C.section 360bbb-3(b)(1), unless the authorization is terminated  or revoked sooner.       Influenza A by PCR NEGATIVE NEGATIVE Final   Influenza B by PCR NEGATIVE NEGATIVE Final    Comment: (NOTE) The Xpert Xpress SARS-CoV-2/FLU/RSV plus assay is intended as an aid in the diagnosis of influenza from Nasopharyngeal swab specimens and should not be used as a sole basis for treatment. Nasal washings and aspirates are unacceptable for Xpert Xpress SARS-CoV-2/FLU/RSV testing.  Fact Sheet for Patients: EntrepreneurPulse.com.au  Fact Sheet for Healthcare Providers: IncredibleEmployment.be  This test is not yet approved or cleared by the [place] FDA and has been authorized for detection and/or diagnosis of SARS-CoV-2 by FDA under an Emergency Use Authorization (EUA). This EUA will remain in effect (meaning this test can be used) for the duration of the COVID-19 declaration under Section 564(b)(1) of the Act, 21 U.S.C. section 360bbb-3(b)(1), unless the authorization is terminated or revoked.  Performed at [facility], [place] [address], [place], [place] [address]          Radiology Studies: DG Chest 2 View  Result Date: 12/10/2020 CLINICAL DATA:  Fever, sepsis EXAM: CHEST - 2 VIEW COMPARISON:  10/31/2020 FINDINGS: The heart size and mediastinal contours are within normal limits. Right chest port catheter. Both lungs are clear. Benign, calcified nodules of the right upper lobe. The visualized skeletal structures are unremarkable. IMPRESSION: No acute abnormality of the lungs. Electronically Signed   By: Eddie Candle M.D.   On: 12/10/2020 13:43   CT ABDOMEN PELVIS W CONTRAST  Result Date: 12/10/2020 CLINICAL DATA:  Fever, currently being treated for metastatic gastric cancer EXAM: CT ABDOMEN AND PELVIS WITH CONTRAST TECHNIQUE: Multidetector CT imaging of the abdomen and pelvis was  performed using the standard protocol following bolus administration of intravenous contrast. CONTRAST:  90m OMNIPAQUE IOHEXOL 350 MG/ML SOLN COMPARISON:  CT abdomen/pelvis 12/08/2020 FINDINGS: Lower chest: The lung bases are clear. The imaged heart is unremarkable. Hepatobiliary: A cholecystostomy tube is in place coiled in the gallbladder lumen. A percutaneous transhepatic biliary drain is also in place coiled in the duodenum. The position of the drain is unchanged. Mild intrahepatic biliary ductal dilatation is unchanged, with no progressive biliary ductal dilatation.  A hypodense lesion in the right hepatic lobe near the dome is unchanged. An additional hypodense lesion in the inferior liver and segment IV B is unchanged going back to 09/21/2020. No new focal lesion is seen. There is no evidence of hepatic abscess. Pancreas: There is no focal lesion. There is no main pancreatic ductal dilatation or peripancreatic inflammatory change. Spleen: Unremarkable. Adrenals/Urinary Tract: The adrenals are unremarkable. There is a 5 mm nonobstructing right lower pole renal stone, unchanged. There are no other focal lesions or stones. There is no hydronephrosis or hydroureter. The bladder wall is mildly thickened. Stomach/Bowel: Postsurgical changes in the stomach are again seen. There is no evidence of complication at the suture site. There is no evidence of bowel obstruction. There is moderate stool throughout the colon, similar to the prior study. There is no abnormal bowel wall thickening or inflammatory change. The appendix is normal. Vascular: The abdominal aorta is nonaneurysmal. The major branch vessels are patent. The main portal and splenic veins are patent. Lymphatic/peritoneum: There is no abdominal or pelvic lymphadenopathy. Multiple soft tissue nodules are again seen in the mesenteric fat throughout the abdomen, for example in the right hemiabdomen measuring 1.1 cm (2-33), 0.6 cm (2-37), and in the left  hemiabdomen measuring 1.3 cm (2-35) and 1.5 cm more inferiorly (2-38). A 1.5 cm lesion in the pelvis just anterior to the rectum is unchanged (2-66). These lesions are overall similar to the study from 09/21/2020. Reproductive: The prostate is prominent with scattered calcifications, unchanged. The seminal vesicles are unremarkable. Other: There is no ascites or free air. There is no intra-abdominal abscess identified. Musculoskeletal: There is no acute osseous abnormality or aggressive osseous lesion. There are bilateral L5-S1 pars defects, unchanged. IMPRESSION: 1. Overall no significant interval change since 12/08/2020. 2. Stable position of the cholecystostomy tube and percutaneous transhepatic biliary drain. 3. Unchanged intrahepatic biliary ductal dilatation with no new or progressive dilatation. 4. Multiple peritoneal metastatic deposits are unchanged. 5. Stable hypodense lesion in hepatic segment IVB. Electronically Signed   By: Valetta Mole M.D.   On: 12/10/2020 16:56        Scheduled Meds:  Chlorhexidine Gluconate Cloth  6 each Topical Daily   dorzolamide-timolol  1 drop Both Eyes BID   enoxaparin (LOVENOX) injection  40 mg Subcutaneous Q24H   famotidine  40 mg Oral BID   feeding supplement  237 mL Oral TID BM   latanoprost  1 drop Both Eyes QHS   multivitamin with minerals  1 tablet Oral Daily   polyethylene glycol  17 g Oral BID   senna-docusate  2 tablet Oral BID   Tbo-Filgrastim  480 mcg Subcutaneous q1800   Continuous Infusions:  sodium chloride 75 mL/hr at 12/11/20 0056   ceFEPime (MAXIPIME) IV 2 g (12/11/20 1014)     LOS: 1 day        Hosie Poisson, MD [facility]   To contact the attending provider between 7A-7P or the covering provider during after hours 7P-7A, please log into the web site [email or web address] and access using universal [place] password for that web site. If you do not have the password, please call the hospital operator.  12/11/2020, 2:02 PM

## 2020-12-11 NOTE — Progress Notes (Signed)
Unable to get urine return from foley catheter, MD notified.   RN to completed bladder scans throughout the night.

## 2020-12-11 NOTE — Consult Note (Signed)
Referral MD  N/A  Reason for Referral  metastatic gastric cancer; neutropenic fever  Chief Complaint  Patient presents with   Fever  : I got sick yesterday.  HPI: [name] is well-known to me.  He is a very nice 80 year old African-American male.  He has metastatic gastric cancer.  He has a indwelling biliary drainage catheter in place.  He is on salvage chemotherapy with carboplatinum/Taxol/Keytruda.  He had his second cycle of treatment on 12/03/2020.  We did not give him Neulasta with this last cycle because he has some issues with the first cycle of Neulasta.  He now was admitted with neutropenic fever.  He had vomiting.  He does have a indwelling biliary drainage catheter.  Today, his white cell count 600.  Hemoglobin 9.5.  Platelet count 155,000.  His bilirubin is 1.2.  His AST is 66.  ALT is 113.  BUN is 12 creatinine 0.62.  Cultures have been taken.  He is on IV antibiotics.  Para he had vomiting.  Had a CT scan of the abdomen and pelvis.  Thankfully, this did not show any growth of his cancer.  There is no obstruction.  There is no abscess.  Culture is are pending.  He has not had a biliary culture taken yet.  He has had no bleeding.  There is no cough.  He has had no mouth sores.  He clearly has lost weight.  I am not sure that he is going to be a candidate for any additional chemotherapy after this.  We are dealing with quality of life issues now.  I do agree with the DNR status.  Currently, his performance status is ECOG 2.   Past Medical History:  Diagnosis Date   Acoustic neuroma ([place]) 06/25/2015   Allergic rhinitis 07/18/2014   Allergy    Anemia    Arthritis    BPH (benign prostatic hyperplasia)    Cancer ([place])    stage III stomach-Dx 03/2019   Carbuncle    recurrent MRSA carbuncles   Cataract    Per pt bilateral cataracts removed.   Diabetes mellitus    Type II. Per pt Dr. Jenny Reichmann took him off his dm med.   Disk prolapse    Glaucoma    Hyperlipidemia     Hypertension    Male hypogonadism 07/16/2014   Neuropathy    Pneumonia    Sinus bradycardia    chronic, asymptomatic   Sinusitis 07/29/2012  :   Past Surgical History:  Procedure Laterality Date   ANTERIOR CERVICAL DECOMPRESSION/DISCECTOMY FUSION 4 LEVELS N/A 01/08/2020   Procedure: ANTERIOR CERVICAL DECOMPRESSION FUSION CERVICAL 3-4, CERVICAL 4-5, CERVIAL 5-6 WITH INSTRUMENTATION AND ALLOGRAFT;  Surgeon: Phylliss Bob, MD;  Location: [facility];  Service: Orthopedics;  Laterality: N/A;   BALLOON DILATION N/A 03/12/2019   Procedure: BALLOON DILATION;  Surgeon: Rush Landmark Telford Nab., MD;  Location: [facility];  Service: Gastroenterology;  Laterality: N/A;  pyloric   BIOPSY  03/08/2019   Procedure: BIOPSY;  Surgeon: Yetta Flock, MD;  Location: [facility];  Service: Gastroenterology;;   BIOPSY  03/12/2019   Procedure: BIOPSY;  Surgeon: Irving Copas., MD;  Location: [facility];  Service: Gastroenterology;;   CATARACT EXTRACTION     x 2   COLONOSCOPY     ENDOSCOPIC RETROGRADE CHOLANGIOPANCREATOGRAPHY (ERCP) WITH PROPOFOL N/A 09/09/2020   Procedure: ENDOSCOPIC RETROGRADE CHOLANGIOPANCREATOGRAPHY (ERCP) WITH PROPOFOL;  Surgeon: Irving Copas., MD;  Location: [facility];  Service: Gastroenterology;  Laterality: N/A;   ESOPHAGOGASTRODUODENOSCOPY  N/A 03/08/2019   Procedure: ESOPHAGOGASTRODUODENOSCOPY (EGD);  Surgeon: Yetta Flock, MD;  Location: [facility];  Service: Gastroenterology;  Laterality: N/A;   ESOPHAGOGASTRODUODENOSCOPY (EGD) WITH PROPOFOL N/A 03/12/2019   Procedure: ESOPHAGOGASTRODUODENOSCOPY (EGD) WITH PROPOFOL;  Surgeon: Rush Landmark Telford Nab., MD;  [facility];  Service: Gastroenterology;  Laterality: N/A;   FINE NEEDLE ASPIRATION  03/12/2019   Procedure: FINE NEEDLE ASPIRATION (FNA) LINEAR;  Surgeon: Irving Copas., MD;  Location: [facility];  Service: Gastroenterology;;   FOREIGN BODY REMOVAL  03/08/2019   Procedure:  FOREIGN BODY REMOVAL;  Surgeon: Yetta Flock, MD;  Location: [facility];  Service: Gastroenterology;;   IR BILIARY DRAIN PLACEMENT WITH CHOLANGIOGRAM  09/09/2020   IR CHOLANGIOGRAM EXISTING TUBE  09/29/2020   IR CHOLANGIOGRAM EXISTING TUBE  09/29/2020   IR CHOLANGIOGRAM EXISTING TUBE  11/13/2020   IR CHOLANGIOGRAM EXISTING TUBE  11/13/2020   IR EXCHANGE BILIARY DRAIN  09/24/2020   IR EXCHANGE BILIARY DRAIN  10/30/2020   IR EXCHANGE BILIARY DRAIN  10/30/2020   IR INT EXT BILIARY DRAIN WITH CHOLANGIOGRAM  09/10/2020   IR PATIENT EVAL TECH 0-60 MINS  04/03/2019   IR PERC CHOLECYSTOSTOMY  09/24/2020   LAPAROTOMY N/A 03/19/2019   Procedure: EXPLORATORY LAPAROTOMY, distal GASTRECTOMY AND PLACEMENT OF G AND J TUBE, gastric jejunostomy;  Surgeon: Greer Pickerel, MD;  Location: [facility];  Service: General;  Laterality: N/A;   POLYPECTOMY     PORTACATH PLACEMENT N/A 03/26/2019   Procedure: INSERTION PORT-A-CATH;  Surgeon: Michael Boston, MD;  Location: [facility];  Service: General;  Laterality: N/A;   [place] INJECTION  09/09/2020   Procedure: SUBMUCOSAL TATTOO INJECTION;  Surgeon: Irving Copas., MD;  Location: [facility];  Service: Gastroenterology;;   UPPER ESOPHAGEAL ENDOSCOPIC ULTRASOUND (EUS) N/A 03/12/2019   Procedure: UPPER ESOPHAGEAL ENDOSCOPIC ULTRASOUND (EUS);  Surgeon: Irving Copas., MD;  Location: [facility];  Service: Gastroenterology;  Laterality: N/A;   UPPER GASTROINTESTINAL ENDOSCOPY     VIDEO BRONCHOSCOPY WITH ENDOBRONCHIAL ULTRASOUND N/A 05/22/2019   Procedure: VIDEO BRONCHOSCOPY WITH ENDOBRONCHIAL ULTRASOUND;  Surgeon: Garner Nash, DO;  Location: [facility];  Service: Thoracic;  Laterality: N/A;  :   Current Facility-Administered Medications:    0.9 %  sodium chloride infusion, , Intravenous, Continuous, Vann, Jessica U, DO, Last Rate: 75 mL/hr at 12/11/20 0056, New Bag at 12/11/20 0056   acetaminophen (TYLENOL) tablet 650 mg, 650 mg,  Oral, Q6H PRN **OR** acetaminophen (TYLENOL) suppository 650 mg, 650 mg, Rectal, Q6H PRN, Vann, Jessica U, DO   ceFEPIme (MAXIPIME) 2 g in sodium chloride 0.9 % 100 mL IVPB, 2 g, Intravenous, Q8H, Vann, Jessica U, DO, Last Rate: 200 mL/hr at 12/11/20 0057, 2 g at 12/11/20 0057   dorzolamide-timolol (COSOPT) 22.3-6.8 MG/ML ophthalmic solution 1 drop, 1 drop, Both Eyes, BID, Vann, Jessica U, DO   enoxaparin (LOVENOX) injection 40 mg, 40 mg, Subcutaneous, Q24H, Vann, Jessica U, DO   famotidine (PEPCID) tablet 40 mg, 40 mg, Oral, BID, Navil Kole, Rudell Cobb, MD   latanoprost (XALATAN) 0.005 % ophthalmic solution 1 drop, 1 drop, Both Eyes, QHS, Vann, Jessica U, DO, 1 drop at 12/10/20 2332   meclizine (ANTIVERT) tablet 12.5 mg, 12.5 mg, Oral, Daily PRN, Vann, Jessica U, DO   ondansetron (ZOFRAN) tablet 4 mg, 4 mg, Oral, Q6H PRN **OR** ondansetron (ZOFRAN) injection 4 mg, 4 mg, Intravenous, Q6H PRN, Vann, Jessica U, DO   polyethylene glycol (MIRALAX / GLYCOLAX) packet 17 g, 17 g,  Oral, BID, Geradine Girt, DO, 17 g at 12/10/20 2325   polyvinyl alcohol (LIQUIFILM TEARS) 1.4 % ophthalmic solution 1 drop, 1 drop, Both Eyes, TID PRN, Vann, Jessica U, DO   senna-docusate (Senokot-S) tablet 2 tablet, 2 tablet, Oral, BID, Vann, Jessica U, DO, 2 tablet at 12/10/20 2325   Tbo-Filgrastim (GRANIX) injection 480 mcg, 480 mcg, Subcutaneous, q1800, Volanda Napoleon, MD, 480 mcg at 12/10/20 2025:   dorzolamide-timolol  1 drop Both Eyes BID   enoxaparin (LOVENOX) injection  40 mg Subcutaneous Q24H   famotidine  40 mg Oral BID   latanoprost  1 drop Both Eyes QHS   polyethylene glycol  17 g Oral BID   senna-docusate  2 tablet Oral BID   Tbo-Filgrastim  480 mcg Subcutaneous q1800  :  No Known Allergies:   Family History  Problem Relation Age of Onset   Cancer Mother    Hypertension Mother    Diabetes Sister    Cancer Brother        colon   Hypertension Brother    Hyperlipidemia Brother    Stroke Brother     Hypertension Sister    Colon cancer Neg Hx    Heart attack Neg Hx    Sudden death Neg Hx    Esophageal cancer Neg Hx    Pancreatic cancer Neg Hx    Stomach cancer Neg Hx    Rectal cancer Neg Hx   :   Social History   Socioeconomic History   Marital status: Married    Spouse name: Not on file   Number of children: 7   Years of education: 7   Highest education level: Not on file  Occupational History   Occupation: Mining engineer: RETIRED  Tobacco Use   Smoking status: Former    Packs/day: 1.00    Years: 24.00    Pack years: 24.00    Types: Cigarettes    Quit date: 10/03/1978    Years since quitting: 42.2   Smokeless tobacco: Never  Vaping Use   Vaping Use: Never used  Substance and Sexual Activity   Alcohol use: Not Currently   Drug use: No   Sexual activity: Yes    Partners: Female  Other Topics Concern   Not on file  Social History Narrative   9th grade.  Married '60. Jonathan Lewis 3 sons -'72 '64, '66; 4 dtrs -'60, '62, '61, '65; 15 grands, 25 great-grands.   Jonathan Lewis, some cleaning work. Lives alone with wife.   ACD- discussed living will and HCPOA (July '14) provided packet.          Social Determinants of Health   Financial Resource Strain: Not on file  Food Insecurity: Not on file  Transportation Needs: Not on file  Physical Activity: Not on file  Stress: Not on file  Social Connections: Not on file  Intimate Partner Violence: Not on file  :  Pertinent items noted in HPI and remainder of comprehensive ROS otherwise negative. Block Exam: Patient Vitals for the past 24 hrs:  BP Temp Temp src Pulse Resp SpO2 Height Weight  12/11/20 0545 98/75 98.2 F (36.8 C) Oral 89 16 100 % -- --  12/11/20 0015 95/73 98.2 F (36.8 C) Oral 88 15 100 % -- --  12/10/20 2100 -- -- -- -- -- -- '5\' 8"'$  (1.727 m) 148 lb 13 oz (67.5 kg)  12/10/20 2010 117/76 98 F (36.7 C) Oral 81 17 96 % -- --  12/10/20 1700  100/78 99 F (37.2 C) Oral 60 16 100 % -- --  12/10/20 1642 100/77  -- -- 80 16 100 % -- --  12/10/20 1430 97/74 -- -- 88 16 99 % -- --  12/10/20 1428 99/69 98 F (36.7 C) Oral (!) 101 17 98 % -- --  12/10/20 1245 -- -- -- -- -- -- '5\' 8"'$  (1.727 m) 149 lb 14.6 oz (68 kg)  12/10/20 1239 100/70 98.1 F (36.7 C) -- 94 20 99 % -- --   Physical Exam Vitals reviewed.  HENT:     Head: Normocephalic and atraumatic.  Eyes:     Pupils: Pupils are equal, round, and reactive to light.  Cardiovascular:     Rate and Rhythm: Normal rate and regular rhythm.     Heart sounds: Normal heart sounds.  Pulmonary:     Effort: Pulmonary effort is normal.     Breath sounds: Normal breath sounds.  Abdominal:     General: Bowel sounds are normal.     Palpations: Abdomen is soft.  Musculoskeletal:        General: No tenderness or deformity. Normal range of motion.     Cervical back: Normal range of motion.  Lymphadenopathy:     Cervical: No cervical adenopathy.  Skin:    General: Skin is warm and dry.     Findings: No erythema or rash.  Neurological:     Mental Status: He is alert and oriented to person, place, and time.  Psychiatric:        Behavior: Behavior normal.        Thought Content: Thought content normal.        Judgment: Judgment normal.      Recent Labs    12/10/20 1254 12/11/20 0500  WBC 0.9* 0.6*  HGB 11.7* 9.5*  HCT 35.3* 28.2*  PLT 218 155    Recent Labs    12/10/20 1254 12/11/20 0500  NA 130* 132*  K 5.2* 4.2  CL 101 104  CO2 22 21*  GLUCOSE 118* 91  BUN 15 12  CREATININE 0.69 0.62  CALCIUM 9.1 8.4*    Blood smear review: None  Pathology: None  DG Chest 2 View  Result Date: 12/10/2020 CLINICAL DATA:  Fever, sepsis EXAM: CHEST - 2 VIEW COMPARISON:  10/31/2020 FINDINGS: The heart size and mediastinal contours are within normal limits. Right chest port catheter. Both lungs are clear. Benign, calcified nodules of the right upper lobe. The visualized skeletal structures are unremarkable. IMPRESSION: No acute abnormality of the  lungs. Electronically Signed   By: Eddie Candle M.D.   On: 12/10/2020 13:43   CT ABDOMEN PELVIS W CONTRAST  Result Date: 12/10/2020 CLINICAL DATA:  Fever, currently being treated for metastatic gastric cancer EXAM: CT ABDOMEN AND PELVIS WITH CONTRAST TECHNIQUE: Multidetector CT imaging of the abdomen and pelvis was performed using the standard protocol following bolus administration of intravenous contrast. CONTRAST:  53m OMNIPAQUE IOHEXOL 350 MG/ML SOLN COMPARISON:  CT abdomen/pelvis 12/08/2020 FINDINGS: Lower chest: The lung bases are clear. The imaged heart is unremarkable. Hepatobiliary: A cholecystostomy tube is in place coiled in the gallbladder lumen. A percutaneous transhepatic biliary drain is also in place coiled in the duodenum. The position of the drain is unchanged. Mild intrahepatic biliary ductal dilatation is unchanged, with no progressive biliary ductal dilatation. A hypodense lesion in the right hepatic lobe near the dome is unchanged. An additional hypodense lesion in the inferior liver and segment IV B is unchanged going  back to 09/21/2020. No new focal lesion is seen. There is no evidence of hepatic abscess. Pancreas: There is no focal lesion. There is no main pancreatic ductal dilatation or peripancreatic inflammatory change. Spleen: Unremarkable. Adrenals/Urinary Tract: The adrenals are unremarkable. There is a 5 mm nonobstructing right lower pole renal stone, unchanged. There are no other focal lesions or stones. There is no hydronephrosis or hydroureter. The bladder wall is mildly thickened. Stomach/Bowel: Postsurgical changes in the stomach are again seen. There is no evidence of complication at the suture site. There is no evidence of bowel obstruction. There is moderate stool throughout the colon, similar to the prior study. There is no abnormal bowel wall thickening or inflammatory change. The appendix is normal. Vascular: The abdominal aorta is nonaneurysmal. The major branch  vessels are patent. The main portal and splenic veins are patent. Lymphatic/peritoneum: There is no abdominal or pelvic lymphadenopathy. Multiple soft tissue nodules are again seen in the mesenteric fat throughout the abdomen, for example in the right hemiabdomen measuring 1.1 cm (2-33), 0.6 cm (2-37), and in the left hemiabdomen measuring 1.3 cm (2-35) and 1.5 cm more inferiorly (2-38). A 1.5 cm lesion in the pelvis just anterior to the rectum is unchanged (2-66). These lesions are overall similar to the study from 09/21/2020. Reproductive: The prostate is prominent with scattered calcifications, unchanged. The seminal vesicles are unremarkable. Other: There is no ascites or free air. There is no intra-abdominal abscess identified. Musculoskeletal: There is no acute osseous abnormality or aggressive osseous lesion. There are bilateral L5-S1 pars defects, unchanged. IMPRESSION: 1. Overall no significant interval change since 12/08/2020. 2. Stable position of the cholecystostomy tube and percutaneous transhepatic biliary drain. 3. Unchanged intrahepatic biliary ductal dilatation with no new or progressive dilatation. 4. Multiple peritoneal metastatic deposits are unchanged. 5. Stable hypodense lesion in hepatic segment IVB. Electronically Signed   By: Valetta Mole M.D.   On: 12/10/2020 16:56   CT Abdomen Pelvis W Contrast  Result Date: 12/08/2020 CLINICAL DATA:  Bowel obstruction suspected EXAM: CT ABDOMEN AND PELVIS WITH CONTRAST TECHNIQUE: Multidetector CT imaging of the abdomen and pelvis was performed using the standard protocol following bolus administration of intravenous contrast. CONTRAST:  63m OMNIPAQUE IOHEXOL 350 MG/ML SOLN COMPARISON:  11/21/2020 FINDINGS: Lower chest: No acute abnormality. Hepatobiliary: Cholecystostomy tube in place as well as left percutaneous transhepatic biliary drain. Mild intrahepatic biliary ductal dilatation. Small scattered hypodensities in the liver, likely small cysts.  Pancreas: No focal abnormality or ductal dilatation. Spleen: No focal abnormality.  Normal size. Adrenals/Urinary Tract: No adrenal abnormality. No focal renal abnormality. No stones or hydronephrosis. Urinary bladder is unremarkable. Stomach/Bowel: Postoperative changes in the stomach. Moderate stool burden in the colon. Stomach, large and small bowel grossly unremarkable. Vascular/Lymphatic: Aorta normal caliber. No evidence of aneurysm or adenopathy. Reproductive: Prostate prominence with calcifications. Other: No free fluid or free air. Small peritoneal nodules are again seen in the left abdomen and right upper abdomen, unchanged. Musculoskeletal: No acute bony abnormality. IMPRESSION: Percutaneous cholecystostomy and percutaneous biliary drain remain in place, unchanged. Stable mild intrahepatic biliary ductal dilatation. Moderate stool burden throughout the colon. Stable evidence of peritoneal disease. No acute findings. Electronically Signed   By: KRolm BaptiseM.D.   On: 12/08/2020 19:06   CT Abdomen Pelvis W Contrast  Result Date: 11/21/2020 CLINICAL DATA:  History of percutaneous biliary drainage and percutaneous cholecystostomy tube placement with drainage around catheters by report. This 80year old male also with history of gastric cancer. EXAM: CT ABDOMEN AND PELVIS  WITH CONTRAST TECHNIQUE: Multidetector CT imaging of the abdomen and pelvis was performed using the standard protocol following bolus administration of intravenous contrast. CONTRAST:  34m OMNIPAQUE IOHEXOL 350 MG/ML SOLN COMPARISON:  CT evaluation of November 16, 2020. FINDINGS: Lower chest: Central venous access device terminating in the caval to atrial junction. Hepatobiliary: Unchanged appearance of percutaneous biliary drainage catheter entering via LEFT-sided approach. Marker for sideholes at the level of the inter lobar fissure within liver parenchyma. Percutaneous cholecystostomy tube remains in place. Peripheral biliary ductal  dilation extending to hepatic subsegment VIII is unchanged. Mild stranding about the porta hepatis also likewise unchanged. Portal vein is patent. Pancreas: Normal, without mass, inflammation or ductal dilatation. Spleen: Spleen normal size and contour. Adrenals/Urinary Tract: Adrenal glands are normal. Nephrolithiasis of the RIGHT kidney is unchanged. No hydronephrosis. No perinephric stranding. Urinary bladder with smooth contours. No perivesical stranding. Stomach/Bowel: Post gastrojejunostomy. No signs of bowel obstruction. Hepatic flexure and proximal and mid transverse colon are collapsed. Query mild thickening versus collapse. The appendix is normal. Resolution of bowel distension in the RIGHT upper quadrant. Vascular/Lymphatic: Calcified and noncalcified atheromatous plaque of the abdominal aorta. There is no gastrohepatic or hepatoduodenal ligament lymphadenopathy. No retroperitoneal or mesenteric lymphadenopathy. No pelvic sidewall lymphadenopathy. Reproductive: Unremarkable by CT. Other: Small fat containing LEFT inguinal hernia. Small ventral hernia at the umbilicus containing fat. No free air in the abdomen. No ascites. Peritoneal nodularity along the LEFT flank, largest approximately 16 mm as on the very recent comparison evaluation. Also with peritoneal nodularity in the RIGHT upper quadrant with similar appearance. Musculoskeletal: No acute musculoskeletal process. Spinal degenerative changes. IMPRESSION: 1. Unchanged appearance of percutaneous biliary drainage catheter and percutaneous cholecystostomy tube. Mild stranding along the anterior margin of the LEFT hepatic lobe is again noted without focal fluid collection. 2. Unchanged segmental biliary duct distension in the RIGHT hepatic lobe. Lesion at the RIGHT portal pedicle remains a concern though no discrete lesion is visible. 3. Collapse versus mild thickening of the hepatic flexure and proximal and mid transverse colon. Correlate with any  symptoms of colitis. 4. Unchanged signs of peritoneal disease. 5. Query mild thickening versus collapse of the colon. Correlate with any clinical signs of colitis. 6. Aortic atherosclerosis. Aortic Atherosclerosis (ICD10-I70.0). Electronically Signed   By: GZetta BillsM.D.   On: 11/21/2020 14:27   CT Abdomen Pelvis W Contrast  Result Date: 11/16/2020 CLINICAL DATA:  Gastric cancer.  Restaging. EXAM: CT ABDOMEN AND PELVIS WITH CONTRAST TECHNIQUE: Multidetector CT imaging of the abdomen and pelvis was performed using the standard protocol following bolus administration of intravenous contrast. CONTRAST:  1080mOMNIPAQUE IOHEXOL 300 MG/ML  SOLN COMPARISON:  11/01/2020. FINDINGS: Lower chest: Unremarkable. Hepatobiliary: Internal external biliary stent again noted with cholecystostomy tube in situ. Similar appearance of intrahepatic biliary duct dilatation with heterogeneous liver perfusion, similar to prior. Small soft tissue nodule along the anterior left liver capsule again noted consistent with metastatic disease. Pancreas: No focal mass lesion. No dilatation of the main duct. No intraparenchymal cyst. No peripancreatic edema. Spleen: No splenomegaly. No focal mass lesion. Adrenals/Urinary Tract: No adrenal nodule or mass. 7 mm nonobstructing stone noted lower pole right kidney left kidney unremarkable. No evidence for hydroureter. Mild bladder wall thickening evident. Stomach/Bowel: Status post distal gastrectomy with gastrojejunostomy. Mild distention of small bowel loops noted right abdomen, measuring up to 2.6 cm diameter, progressive in the interval. There is no substantial associated bowel wall thickening although a trace amount of perienteric edema is evident (see image 43  of axial series 2). There appears to be a transition zone in the anterior right abdomen with mild fecalization of small bowel contents immediately proximal (see axial 46/2 and coronal 65/5). No discrete obstructing mass lesion  evident at this location although small bowel beyond this point is decompressed. Terminal ileum is decompressed. The appendix is not well visualized, The appendix is best seen on coronal images and is unremarkable (38/5). No gross colonic mass. No colonic wall thickening. Vascular/Lymphatic: No abdominal aortic aneurysm. No abdominal aortic atherosclerotic calcification. There is no gastrohepatic or hepatoduodenal ligament lymphadenopathy. No retroperitoneal or mesenteric lymphadenopathy. No pelvic sidewall lymphadenopathy. Reproductive: Prostate gland upper normal to mildly increased in size. Other: No substantial intraperitoneal free fluid. 8 mm right omental nodule measured previously is stable at 8 mm on image 40/2 today. 12 mm left omental nodule seen on the prior study is stable on image 44/2 today. There is omental nodularity in the region of the splenic flexure, similar to prior. Musculoskeletal: Tiny left groin hernia contains only fat. No worrisome lytic or sclerotic osseous abnormality. IMPRESSION: 1. Interval development of mild distention of small bowel loops in the right abdomen with a transition zone in the anterior right abdomen. No discrete obstructing mass lesion evident at this location although small bowel beyond this point is decompressed. Imaging features suspicious for stricture/partial obstruction. 2. Heterogeneous enhancement noted in the liver likely related to perfusion anomaly although infiltrative tumor not excluded. 3. Similar appearance of intrahepatic biliary duct dilatation with cholecystostomy tube in situ. 4. Similar appearance of omental nodularity, consistent with metastatic disease. 5. Nonobstructing right renal stone. 6. Tiny left groin hernia contains only fat. Electronically Signed   By: Misty Stanley M.D.   On: 11/16/2020 12:42   IR CHOLANGIOGRAM EXISTING TUBE  Result Date: 11/13/2020 INDICATION: 81 year old with history of gastric cancer and obstructive jaundice.  Patient has an internal/external biliary drain and cholecystostomy tube. Patient was recently in the hospital with sepsis. EXAM: CHOLANGIOGRAM THROUGH EXISTING CATHETERS X 2 MEDICATIONS: None ANESTHESIA/SEDATION: None FLUOROSCOPY TIME:  Fluoroscopy Time: 54 seconds, 8 mGy CONTRAST:  10 mL Omnipaque [id number] COMPLICATIONS: None immediate. PROCEDURE: Patient was placed supine on the fluoroscopic table. The cholecystostomy tube and biliary drain were both injected with contrast. Both drains were flushed with saline at the end of the procedure. The cholecystostomy tube was attached to a gravity bag. The internal/external biliary drain was capped. FINDINGS: Left-sided internal/external biliary drain is patent. Drain extends into the duodenum. Contrast drains into the duodenum. Minor filling of the intrahepatic biliary system. Cholecystostomy tube is positioned in the gallbladder. There is filling of the cystic duct but no drainage into the common bile duct. IMPRESSION: 1. Internal/external biliary drain is well positioning and functioning well. Biliary drain was capped. Patient was sent home with a gravity bag and given instructions to use the bag if he has fevers or abdominal symptoms. 2. Cystic duct is obstructed. Cholecystostomy tube was attached to a gravity bag. 3. Patient will be scheduled for routine drain exchanges. Electronically Signed   By: Markus Daft M.D.   On: 11/13/2020 16:50   IR CHOLANGIOGRAM EXISTING TUBE  Result Date: 11/13/2020 INDICATION: 80 year old with history of gastric cancer and obstructive jaundice. Patient has an internal/external biliary drain and cholecystostomy tube. Patient was recently in the hospital with sepsis. EXAM: CHOLANGIOGRAM THROUGH EXISTING CATHETERS X 2 MEDICATIONS: None ANESTHESIA/SEDATION: None FLUOROSCOPY TIME:  Fluoroscopy Time: 54 seconds, 8 mGy CONTRAST:  10 mL Omnipaque [id number] COMPLICATIONS: None immediate. PROCEDURE: Patient was placed  supine on the fluoroscopic table. The  cholecystostomy tube and biliary drain were both injected with contrast. Both drains were flushed with saline at the end of the procedure. The cholecystostomy tube was attached to a gravity bag. The internal/external biliary drain was capped. FINDINGS: Left-sided internal/external biliary drain is patent. Drain extends into the duodenum. Contrast drains into the duodenum. Minor filling of the intrahepatic biliary system. Cholecystostomy tube is positioned in the gallbladder. There is filling of the cystic duct but no drainage into the common bile duct. IMPRESSION: 1. Internal/external biliary drain is well positioning and functioning well. Biliary drain was capped. Patient was sent home with a gravity bag and given instructions to use the bag if he has fevers or abdominal symptoms. 2. Cystic duct is obstructed. Cholecystostomy tube was attached to a gravity bag. 3. Patient will be scheduled for routine drain exchanges. Electronically Signed   By: Markus Daft M.D.   On: 11/13/2020 16:50    Assessment and Plan: Mr. Novo is a 80 year old African-American male.  He has metastatic gastric cancer.  He is now neutropenic.  He had a fever.  He is on IV antibiotics.  It would not surprise me if his cultures are positive, particular the biliary drainage.  He is on Neupogen.  We will have to follow along.  Again, I just do not think that he is can be a candidate for any further therapy.  Thankfully, his cancer is not progressing on the CT scan.  As such, hopefully he will have a better quality of life being off treatment.  I know that he will get outstanding care from all staff of on [facility].  We will follow his blood counts daily.  Will be interesting to see how his liver function studies go.  I do appreciate everybody's help.  Lattie Haw, MD  Jeneen Rinks 1:19

## 2020-12-11 NOTE — Progress Notes (Signed)
Pharmacy Antibiotic Note  Jonathan Lewis is a 80 y.o. male admitted on 12/10/2020 with medical history significant of stage IIIa adenocarcinoma of the stomach, recently finished cycle 8 of chemo, presents to ED with neutropenia, constipation and drainage from his abd tubes..  Pharmacy has been consulted for cefepime dosing.  Plan: Cefepime 2gm IV q8h Follow renal function, cultures and clinical course  Height: '5\' 8"'$  (172.7 cm) Weight: 67.5 kg (148 lb 13 oz) IBW/kg (Calculated) : 68.4  Temp (24hrs), Avg:98.3 F (36.8 C), Min:98 F (36.7 C), Max:99 F (37.2 C)  Recent Labs  Lab 12/08/20 1631 12/10/20 1254 12/10/20 1706  WBC 1.4* 0.9*  --   CREATININE 0.72 0.69  --   LATICACIDVEN  --  1.7 1.0    Estimated Creatinine Clearance: 69.1 mL/min (by C-G formula based on SCr of 0.69 mg/dL).    No Known Allergies  Thank you for allowing pharmacy to be a part of this patient's care.  Dolly Rias RPh 12/11/2020, 12:21 AM

## 2020-12-11 NOTE — Progress Notes (Signed)
Initial Nutrition Assessment  DOCUMENTATION CODES:   Non-severe (moderate) malnutrition in context of chronic illness  INTERVENTION:   -Ensure Plus PO TID, each provides 350 kcals and 13g protein  -Multivitamin with minerals daily  -Magic cup BID with meals, each supplement provides 290 kcal and 9 grams of protein  NUTRITION DIAGNOSIS:   Moderate Malnutrition related to chronic illness, cancer and cancer related treatments as evidenced by moderate fat depletion, moderate muscle depletion, percent weight loss.  GOAL:   Patient will meet greater than or equal to 90% of their needs  MONITOR:   Supplement acceptance, Weight trends, Labs, PO intake, I & O's  REASON FOR ASSESSMENT:   Consult Poor PO  ASSESSMENT:   80 y.o. male with medical history significant of stage IIIa adenocarcinoma of the stomach.  Patient had a distal gastrectomy on 03/2019.  He began treatment with FOLFOX via Dr. Marin Olp.  Patient then developed a biliary obstruction from a periportal lymph node.  He had radiation as well as drain placement.  On 9/6 he was seen in the ER with constipation and drainage from his abdominal tubes.  Patient had a CT scan that showed constipation.  Patient reports trying to eat more, states he ate some dinner last night and then had eggs, sausage, grits and prune juice this morning. Pt states he has not had a BM in 3-4 days now. Encouraged him to drink plenty of fluids. He states he was told to drink 4 Ensures a day at home but was only drinking 1-2 PTA. Will order Ensure TID. Pt denies issues with chewing/swallowing.  Per Oncology note, not recommending any further treatment going forward for pt's met gastric cancer. Last chemo was 9/1.  Per weight records, pt has lost 14 lbs since 7/5 (8% wt loss x 2 months, significant for time frame).   Medications: Pepcid, Miralax, Senokot, Lactated ringers  Labs reviewed:  Low Na  NUTRITION - FOCUSED PHYSICAL EXAM:  Flowsheet Row  Most Recent Value  Orbital Region Moderate depletion  Upper Arm Region Mild depletion  Thoracic and Lumbar Region Unable to assess  Buccal Region Moderate depletion  Temple Region Moderate depletion  Clavicle Bone Region Mild depletion  Clavicle and Acromion Bone Region Mild depletion  Scapular Bone Region Unable to assess  Dorsal Hand Unable to assess  Patellar Region Unable to assess  Anterior Thigh Region Unable to assess  Posterior Calf Region Unable to assess  Edema (RD Assessment) None       Diet Order:   Diet Order             Diet regular Room service appropriate? Yes; Fluid consistency: Thin  Diet effective now                   EDUCATION NEEDS:   Education needs have been addressed  Skin:  Skin Assessment: Reviewed RN Assessment  Last BM:  9/5  Height:   Ht Readings from Last 1 Encounters:  12/10/20 $RemoveB'5\' 8"'jwMPNUMg$  (1.727 m)    Weight:   Wt Readings from Last 1 Encounters:  12/10/20 67.5 kg    BMI:  Body mass index is 22.63 kg/m.  Estimated Nutritional Needs:   Kcal:  2000-2200  Protein:  100-115g  Fluid:  2L/day   Clayton Bibles, MS, RD, LDN Inpatient Clinical Dietitian Contact information available via Amion

## 2020-12-12 DIAGNOSIS — C161 Malignant neoplasm of fundus of stomach: Secondary | ICD-10-CM | POA: Diagnosis not present

## 2020-12-12 DIAGNOSIS — D709 Neutropenia, unspecified: Secondary | ICD-10-CM | POA: Diagnosis not present

## 2020-12-12 DIAGNOSIS — E871 Hypo-osmolality and hyponatremia: Secondary | ICD-10-CM | POA: Diagnosis not present

## 2020-12-12 DIAGNOSIS — E876 Hypokalemia: Secondary | ICD-10-CM

## 2020-12-12 DIAGNOSIS — R5081 Fever presenting with conditions classified elsewhere: Secondary | ICD-10-CM | POA: Diagnosis not present

## 2020-12-12 LAB — CBC WITH DIFFERENTIAL/PLATELET
Abs Immature Granulocytes: 0 10*3/uL (ref 0.00–0.07)
Basophils Absolute: 0 10*3/uL (ref 0.0–0.1)
Basophils Relative: 3 %
Eosinophils Absolute: 0 10*3/uL (ref 0.0–0.5)
Eosinophils Relative: 2 %
HCT: 25.5 % — ABNORMAL LOW (ref 39.0–52.0)
Hemoglobin: 8.5 g/dL — ABNORMAL LOW (ref 13.0–17.0)
Immature Granulocytes: 0 %
Lymphocytes Relative: 71 %
Lymphs Abs: 0.5 10*3/uL — ABNORMAL LOW (ref 0.7–4.0)
MCH: 28.2 pg (ref 26.0–34.0)
MCHC: 33.3 g/dL (ref 30.0–36.0)
MCV: 84.7 fL (ref 80.0–100.0)
Monocytes Absolute: 0.1 10*3/uL (ref 0.1–1.0)
Monocytes Relative: 12 %
Neutro Abs: 0.1 10*3/uL — CL (ref 1.7–7.7)
Neutrophils Relative %: 12 %
Platelets: 129 10*3/uL — ABNORMAL LOW (ref 150–400)
RBC: 3.01 MIL/uL — ABNORMAL LOW (ref 4.22–5.81)
RDW: 13.5 % (ref 11.5–15.5)
WBC: 0.7 10*3/uL — CL (ref 4.0–10.5)
nRBC: 0 % (ref 0.0–0.2)

## 2020-12-12 LAB — COMPREHENSIVE METABOLIC PANEL
ALT: 88 U/L — ABNORMAL HIGH (ref 0–44)
AST: 44 U/L — ABNORMAL HIGH (ref 15–41)
Albumin: 2.4 g/dL — ABNORMAL LOW (ref 3.5–5.0)
Alkaline Phosphatase: 140 U/L — ABNORMAL HIGH (ref 38–126)
Anion gap: 4 — ABNORMAL LOW (ref 5–15)
BUN: 10 mg/dL (ref 8–23)
CO2: 23 mmol/L (ref 22–32)
Calcium: 8 mg/dL — ABNORMAL LOW (ref 8.9–10.3)
Chloride: 103 mmol/L (ref 98–111)
Creatinine, Ser: 0.57 mg/dL — ABNORMAL LOW (ref 0.61–1.24)
GFR, Estimated: >60 mL/min (ref >60)
Glucose, Bld: 92 mg/dL (ref 70–99)
Potassium: 3.4 mmol/L — ABNORMAL LOW (ref 3.5–5.1)
Sodium: 130 mmol/L — ABNORMAL LOW (ref 135–145)
Total Bilirubin: 0.9 mg/dL (ref 0.3–1.2)
Total Protein: 5.6 g/dL — ABNORMAL LOW (ref 6.5–8.1)

## 2020-12-12 LAB — PREPARE RBC (CROSSMATCH)

## 2020-12-12 MED ORDER — SODIUM CHLORIDE 0.9% IV SOLUTION: Freq: Once | INTRAVENOUS | Status: AC

## 2020-12-12 MED ORDER — FUROSEMIDE 10 MG/ML IJ SOLN
20.0000 mg | Freq: Once | INTRAMUSCULAR | Status: AC
  Administered 2020-12-12: 20 mg via INTRAVENOUS
  Filled 2020-12-12: qty 2

## 2020-12-12 MED ORDER — PHENAZOPYRIDINE HCL 100 MG PO TABS
100.0000 mg | ORAL_TABLET | Freq: Three times a day (TID) | ORAL | Status: DC
  Administered 2020-12-12 – 2020-12-14 (×6): 100 mg via ORAL
  Filled 2020-12-12 (×7): qty 1

## 2020-12-12 MED ORDER — POTASSIUM CHLORIDE CRYS ER 20 MEQ PO TBCR
40.0000 meq | EXTENDED_RELEASE_TABLET | Freq: Once | ORAL | Status: AC
  Administered 2020-12-12: 40 meq via ORAL
  Filled 2020-12-12: qty 2

## 2020-12-12 MED ORDER — MAGIC MOUTHWASH
5.0000 mL | Freq: Four times a day (QID) | ORAL | Status: DC
  Administered 2020-12-12 – 2020-12-14 (×10): 5 mL via ORAL
  Filled 2020-12-12 (×11): qty 5

## 2020-12-12 NOTE — Progress Notes (Signed)
Looks like Jonathan Lewis might be improving a little bit.  He said he ate a little bit yesterday.  He did not have any nausea or vomiting.  He is having bowel movements.  His hemoglobin is only 8.5.  I really think he is going need to be transfused with 2 units of blood.  His white cell count is 0.7.  He is on Neupogen.  The biliary culture might show some bacteria.  We will have to wait for any final identification.  His albumin is only 2.4.  I will check a prealbumin on him.  He is not complain of any pain.  He is afebrile.  Temperature 98.2.  Pulse 80.  Blood pressure is 103/71.  His oral exam shows no mucositis.  Lungs are clear.  Cardiac exam regular rate and rhythm.  Abdomen is soft.  Bowel sounds are present although slightly decreased.  He has a biliary drainage catheter in place.  There is no guarding or rebound tenderness.  Extremities shows no clubbing, cyanosis or edema.  Neurological exam is nonfocal.  Again, Jonathan Lewis is neutropenic.  His white cell count is stabilized.  Hopefully this will start coming up.  We will go ahead with 2 units of blood today.  I think this will make him feel better.  Hopefully this will help with his appetite.  He is slightly hypokalemic but I would not worry about that right now.  I am going to check the prealbumin on him and we will see how low this is.  I appreciate the outstanding care he is getting from all the staff on [facility].  Lattie Haw, MD  Lurena Joiner 1:37

## 2020-12-12 NOTE — Progress Notes (Signed)
PT Cancellation Note  Patient Details Name: Jonathan Lewis MRN: [id number] DOB: Jul 01, 1940   Cancelled Treatment:    Reason Eval/Treat Not Completed: Attempted PT eval-Rn in with pt and pt eating lunch. Will check back as schedule allows.    [facility]  Office: [phone] Pager: [phone]

## 2020-12-12 NOTE — Progress Notes (Signed)
PROGRESS NOTE    Jonathan Lewis  [id number] DOB: 1941/03/22 DOA: 12/10/2020 PCP: Biagio Borg, MD    Chief Complaint  Patient presents with   Fever    Brief Narrative:  Jonathan Lewis is a 80 y.o. male with medical history significant of stage IIIa adenocarcinoma of the stomach.  Patient had a distal gastrectomy on 03/2019.  He began treatment with FOLFOX via Dr. Marin Olp.  Patient then developed a biliary obstruction from a periportal lymph node.  He had radiation as well as drain placement. He presents to ED with fever. CT of the abdomen pelvis shows  Unchanged intrahepatic biliary ductal dilatation with no new or progressive dilatation. Multiple peritoneal metastatic deposits are unchanged. Stable hypodense lesion in hepatic segment IVB. He was admitted for evaluation of neutropenic fevers. Patient reports pain during urination Assessment & Plan:   Active Problems:   Constipation   Weight loss   Gastric cancer pT3pN2 s/p distal gastrectomy with Billroth II gastrojejunostomy 03/19/2019   Hyponatremia   Neutropenic fever (HCC)   Hyperkalemia   Neutropenic fever S/p Granix injection Absolute neutrophil count is 100 this morning.  CT of the abdomen and pelvis does not show any new lesions at this time. Monitor the patient empirically on IV cefepime, continue with IV fluids at this time. Follow blood cultures, negative so far,. Biliary fluid showing gram-positive cocci and gram-negative rods.  Final cultures report is pending. Patient reports pain during urination but urine analysis is negative.  Urine cultures ordered and pending .em.  Gastric cancer s/p gastrectomy, s/p chemotherapy.  Oncology on board and appreciate recommendations.   Constipation Continue with Senokot.   Mild hyponatremia Probably from dehydration.  Continue to monitor. If no improvement wills tart work up for SIADH.    Hypokalemia  Replaced    Mildly elevated liver enzymes.  Improving.  ? Mets vs from chemo.    Anemia of chronic disease Baseline hemoglobin between 9-10 Hemoglobin dropped to 8.5.  2 units of prbc transfusion ordered as his hemoglobin continues to drop.    Pancytopenia Probably secondary to chemotherapy   DVT prophylaxis: (Lovenox) Code Status: DNR Family Communication: none at bedside.  Disposition:   Status is: Inpatient  Remains inpatient appropriate because:Unsafe d/c plan and IV treatments appropriate due to intensity of illness or inability to take PO  Dispo: The patient is from: Home              Anticipated d/c is to:  pending.              Patient currently is not medically stable to d/c.   Difficult to place patient No       Consultants:  Oncology  Procedures: none.   Antimicrobials:  Antibiotics Given (last 72 hours)     Date/Time Action Medication Dose Rate   12/10/20 1817 New Bag/Given   ceFEPIme (MAXIPIME) 2 g in sodium chloride 0.9 % 100 mL IVPB 2 g 200 mL/hr   12/11/20 0057 New Bag/Given   ceFEPIme (MAXIPIME) 2 g in sodium chloride 0.9 % 100 mL IVPB 2 g 200 mL/hr   12/11/20 1014 New Bag/Given   ceFEPIme (MAXIPIME) 2 g in sodium chloride 0.9 % 100 mL IVPB 2 g 200 mL/hr   12/11/20 1715 New Bag/Given   ceFEPIme (MAXIPIME) 2 g in sodium chloride 0.9 % 100 mL IVPB 2 g 200 mL/hr   12/12/20 0532 New Bag/Given   ceFEPIme (MAXIPIME) 2 g in sodium chloride 0.9 % 100 mL  IVPB 2 g 200 mL/hr   12/12/20 1413 New Bag/Given   ceFEPIme (MAXIPIME) 2 g in sodium chloride 0.9 % 100 mL IVPB 2 g 200 mL/hr         Subjective: Reports having dysuria.    Objective: Vitals:   12/11/20 2031 12/12/20 0549 12/12/20 1245 12/12/20 1319  BP: 98/76 1'03/71 97/78 99/78 '$  Pulse: 76 80 79 84  Resp: '14 18  15  '$ Temp: 97.7 F (36.5 C) 98.2 F (36.8 C) 97.7 F (36.5 C) 98 F (36.7 C)  TempSrc: Oral Oral Oral Oral  SpO2: 100% 100% 100%   Weight:      Height:        Intake/Output Summary (Last 24 hours) at 12/12/2020 1458 Last data  filed at 12/12/2020 1305 Gross per 24 hour  Intake 555 ml  Output 2300 ml  Net -1745 ml    Filed Weights   12/10/20 1245 12/10/20 2100  Weight: 68 kg 67.5 kg    Examination: Physical Exam General: Alert and oriented x 3, NAD Cardiovascular: S1 S2 clear, RRR. No pedal edema b/l Respiratory: CTAB, no wheezing, rales or rhonchi Gastrointestinal: Soft, nontender, nondistended, NBS Ext: no pedal edema bilaterally Neuro: no new deficits Skin: No rashes Psych: Normal affect and demeanor, alert and oriented x3      Data Reviewed: I have personally reviewed following labs and imaging studies  CBC: Recent Labs  Lab 12/08/20 1631 12/10/20 1254 12/11/20 0500 12/12/20 0500  WBC 1.4* 0.9* 0.6* 0.7*  NEUTROABS 0.9* 0.5*  --  0.1*  HGB 11.2* 11.7* 9.5* 8.5*  HCT 33.9* 35.3* 28.2* 25.5*  MCV 85.8 84.9 84.4 84.7  PLT 198 218 155 129*     Basic Metabolic Panel: Recent Labs  Lab 12/08/20 1631 12/10/20 1254 12/11/20 0500 12/12/20 0500  NA 130* 130* 132* 130*  K 4.7 5.2* 4.2 3.4*  CL 100 101 104 103  CO2 24 22 21* 23  GLUCOSE 127* 118* 91 92  BUN '18 15 12 10  '$ CREATININE 0.72 0.69 0.62 0.57*  CALCIUM 8.7* 9.1 8.4* 8.0*     GFR: Estimated Creatinine Clearance: 69.1 mL/min (A) (by C-G formula based on SCr of 0.57 mg/dL (L)).  Liver Function Tests: Recent Labs  Lab 12/08/20 1631 12/10/20 1254 12/11/20 0500 12/12/20 0500  AST 62* 76* 66* 44*  ALT 88* 125* 113* 88*  ALKPHOS 193* 211* 164* 140*  BILITOT 0.9 1.1 1.2 0.9  PROT 7.1 7.9 6.3* 5.6*  ALBUMIN 3.0* 3.4* 2.6* 2.4*     CBG: No results for input(s): GLUCAP in the last 168 hours.   Recent Results (from the past 240 hour(s))  Culture, blood (Routine x 2)     Status: None (Preliminary result)   Collection Time: 12/10/20 12:54 PM   Specimen: BLOOD  Result Value Ref Range Status   Specimen Description   Final    BLOOD LEFT ANTECUBITAL Performed at [facility] [address],  [place], [place] [address]    Special Requests   Final    BOTTLES DRAWN AEROBIC AND ANAEROBIC Blood Culture adequate volume Performed at [facility] [address] [place], [place] [address]    Culture   Final    NO GROWTH < 24 HOURS Performed at [facility] [address] [place],  [address]    Report Status PENDING  Incomplete  Culture, blood (Routine x 2)     Status: None (Preliminary result)   Collection Time: 12/10/20  1:40 PM   Specimen: BLOOD  Result Value Ref Range Status   Specimen Description   Final    BLOOD LEFT ANTECUBITAL Performed at [facility] [address], [place], [place] [address]    Special Requests   Final    BOTTLES DRAWN AEROBIC AND ANAEROBIC Blood Culture results may not be optimal due to an inadequate volume of blood received in culture bottles Performed at [facility] [address] [place], [place] [address]    Culture   Final    NO GROWTH < 24 HOURS Performed at [facility] [address], [place], [place] [address]    Report Status PENDING  Incomplete  Resp Panel by RT-PCR (Flu A&B, Covid) Nasopharyngeal Swab     Status: None   Collection Time: 12/10/20  1:54 PM   Specimen: Nasopharyngeal Swab; Nasopharyngeal(NP) swabs in vial transport medium  Result Value Ref Range Status   SARS Coronavirus 2 by RT PCR NEGATIVE NEGATIVE Final    Comment: (NOTE) SARS-CoV-2 target nucleic acids are NOT DETECTED.  The SARS-CoV-2 RNA is generally detectable in upper respiratory specimens during the acute phase of infection. The lowest concentration of SARS-CoV-2 viral copies this assay can detect is 138 copies/mL. A negative result does not preclude SARS-Cov-2 infection and should not be used as the sole basis for treatment or other patient management decisions. A negative result may occur with  improper specimen collection/handling, submission of specimen other than  nasopharyngeal swab, presence of viral mutation(s) within the areas targeted by this assay, and inadequate number of viral copies(<138 copies/mL). A negative result must be combined with clinical observations, patient history, and epidemiological information. The expected result is Negative.  Fact Sheet for Patients:  EntrepreneurPulse.com.au  Fact Sheet for Healthcare Providers:  IncredibleEmployment.be  This test is no t yet approved or cleared by the [place] FDA and  has been authorized for detection and/or diagnosis of SARS-CoV-2 by FDA under an Emergency Use Authorization (EUA). This EUA will remain  in effect (meaning this test can be used) for the duration of the COVID-19 declaration under Section 564(b)(1) of the Act, 21 U.S.C.section 360bbb-3(b)(1), unless the authorization is terminated  or revoked sooner.       Influenza A by PCR NEGATIVE NEGATIVE Final   Influenza B by PCR NEGATIVE NEGATIVE Final    Comment: (NOTE) The Xpert Xpress SARS-CoV-2/FLU/RSV plus assay is intended as an aid in the diagnosis of influenza from Nasopharyngeal swab specimens and should not be used as a sole basis for treatment. Nasal washings and aspirates are unacceptable for Xpert Xpress SARS-CoV-2/FLU/RSV testing.  Fact Sheet for Patients: EntrepreneurPulse.com.au  Fact Sheet for Healthcare Providers: IncredibleEmployment.be  This test is not yet approved or cleared by the [place] FDA and has been authorized for detection and/or diagnosis of SARS-CoV-2 by FDA under an Emergency Use Authorization (EUA). This EUA will remain in effect (meaning this test can be used) for the duration of the COVID-19 declaration under Section 564(b)(1) of the Act, 21 U.S.C. section 360bbb-3(b)(1), unless the authorization is terminated or revoked.  Performed at [facility], [place] [address] [place],  [address]   Body fluid culture w Gram Stain     Status: None (Preliminary result)   Collection Time: 12/11/20  7:13 AM   Specimen: BILE; Body Fluid  Result Value Ref Range Status   Specimen Description   Final    BILE Performed at [facility],  [place] [address] [place], [place] [address]    Special Requests   Final    Immunocompromised Performed at [facility], [place] [address] [place], [place] [address]    Gram Stain   Final    WBC PRESENT, PREDOMINANTLY MONONUCLEAR GRAM POSITIVE COCCI IN CHAINS IN CLUSTERS GRAM NEGATIVE RODS CYTOSPIN SMEAR    Culture   Final    CULTURE REINCUBATED FOR BETTER GROWTH Performed at [facility], [place] [address], [place], [place] [address]    Report Status PENDING  Incomplete          Radiology Studies: CT ABDOMEN PELVIS W CONTRAST  Result Date: 12/10/2020 CLINICAL DATA:  Fever, currently being treated for metastatic gastric cancer EXAM: CT ABDOMEN AND PELVIS WITH CONTRAST TECHNIQUE: Multidetector CT imaging of the abdomen and pelvis was performed using the standard protocol following bolus administration of intravenous contrast. CONTRAST:  33m OMNIPAQUE IOHEXOL 350 MG/ML SOLN COMPARISON:  CT abdomen/pelvis 12/08/2020 FINDINGS: Lower chest: The lung bases are clear. The imaged heart is unremarkable. Hepatobiliary: A cholecystostomy tube is in place coiled in the gallbladder lumen. A percutaneous transhepatic biliary drain is also in place coiled in the duodenum. The position of the drain is unchanged. Mild intrahepatic biliary ductal dilatation is unchanged, with no progressive biliary ductal dilatation. A hypodense lesion in the right hepatic lobe near the dome is unchanged. An additional hypodense lesion in the inferior liver and segment IV B is unchanged going back to 09/21/2020. No new focal lesion is seen. There is no evidence of hepatic abscess. Pancreas: There is no focal lesion. There  is no main pancreatic ductal dilatation or peripancreatic inflammatory change. Spleen: Unremarkable. Adrenals/Urinary Tract: The adrenals are unremarkable. There is a 5 mm nonobstructing right lower pole renal stone, unchanged. There are no other focal lesions or stones. There is no hydronephrosis or hydroureter. The bladder wall is mildly thickened. Stomach/Bowel: Postsurgical changes in the stomach are again seen. There is no evidence of complication at the suture site. There is no evidence of bowel obstruction. There is moderate stool throughout the colon, similar to the prior study. There is no abnormal bowel wall thickening or inflammatory change. The appendix is normal. Vascular: The abdominal aorta is nonaneurysmal. The major branch vessels are patent. The main portal and splenic veins are patent. Lymphatic/peritoneum: There is no abdominal or pelvic lymphadenopathy. Multiple soft tissue nodules are again seen in the mesenteric fat throughout the abdomen, for example in the right hemiabdomen measuring 1.1 cm (2-33), 0.6 cm (2-37), and in the left hemiabdomen measuring 1.3 cm (2-35) and 1.5 cm more inferiorly (2-38). A 1.5 cm lesion in the pelvis just anterior to the rectum is unchanged (2-66). These lesions are overall similar to the study from 09/21/2020. Reproductive: The prostate is prominent with scattered calcifications, unchanged. The seminal vesicles are unremarkable. Other: There is no ascites or free air. There is no intra-abdominal abscess identified. Musculoskeletal: There is no acute osseous abnormality or aggressive osseous lesion. There are bilateral L5-S1 pars defects, unchanged. IMPRESSION: 1. Overall no significant interval change since 12/08/2020. 2. Stable position of the cholecystostomy tube and percutaneous transhepatic biliary drain. 3. Unchanged intrahepatic biliary ductal dilatation with no new or progressive dilatation. 4. Multiple peritoneal metastatic deposits are unchanged. 5.  Stable hypodense lesion in hepatic segment IVB. Electronically Signed   By: PValetta MoleM.D.   On: 12/10/2020 16:56        Scheduled Meds:  Chlorhexidine Gluconate Cloth  6 each Topical  Daily   dorzolamide-timolol  1 drop Both Eyes BID   enoxaparin (LOVENOX) injection  40 mg Subcutaneous Q24H   famotidine  40 mg Oral BID   feeding supplement  237 mL Oral TID BM   latanoprost  1 drop Both Eyes QHS   magic mouthwash  5 mL Oral QID   multivitamin with minerals  1 tablet Oral Daily   phenazopyridine  100 mg Oral TID WC   polyethylene glycol  17 g Oral BID   senna-docusate  2 tablet Oral BID   Tbo-Filgrastim  480 mcg Subcutaneous q1800   Continuous Infusions:  sodium chloride 75 mL/hr at 12/11/20 0056   ceFEPime (MAXIPIME) IV 2 g (12/12/20 1413)     LOS: 2 days        Hosie Poisson, MD [facility]   To contact the attending provider between 7A-7P or the covering provider during after hours 7P-7A, please log into the web site [email or web address] and access using universal [place] password for that web site. If you do not have the password, please call the hospital operator.  12/12/2020, 2:58 PM

## 2020-12-13 DIAGNOSIS — R5081 Fever presenting with conditions classified elsewhere: Secondary | ICD-10-CM | POA: Diagnosis not present

## 2020-12-13 DIAGNOSIS — R634 Abnormal weight loss: Secondary | ICD-10-CM | POA: Diagnosis not present

## 2020-12-13 DIAGNOSIS — R531 Weakness: Secondary | ICD-10-CM

## 2020-12-13 DIAGNOSIS — D709 Neutropenia, unspecified: Secondary | ICD-10-CM | POA: Diagnosis not present

## 2020-12-13 DIAGNOSIS — Z515 Encounter for palliative care: Secondary | ICD-10-CM | POA: Diagnosis not present

## 2020-12-13 DIAGNOSIS — E871 Hypo-osmolality and hyponatremia: Secondary | ICD-10-CM | POA: Diagnosis not present

## 2020-12-13 DIAGNOSIS — C161 Malignant neoplasm of fundus of stomach: Secondary | ICD-10-CM | POA: Diagnosis not present

## 2020-12-13 LAB — COMPREHENSIVE METABOLIC PANEL
ALT: 68 U/L — ABNORMAL HIGH (ref 0–44)
AST: 32 U/L (ref 15–41)
Albumin: 2.4 g/dL — ABNORMAL LOW (ref 3.5–5.0)
Alkaline Phosphatase: 134 U/L — ABNORMAL HIGH (ref 38–126)
Anion gap: 7 (ref 5–15)
BUN: 10 mg/dL (ref 8–23)
CO2: 22 mmol/L (ref 22–32)
Calcium: 8.3 mg/dL — ABNORMAL LOW (ref 8.9–10.3)
Chloride: 106 mmol/L (ref 98–111)
Creatinine, Ser: 0.5 mg/dL — ABNORMAL LOW (ref 0.61–1.24)
GFR, Estimated: >60 mL/min (ref >60)
Glucose, Bld: 101 mg/dL — ABNORMAL HIGH (ref 70–99)
Potassium: 3.5 mmol/L (ref 3.5–5.1)
Sodium: 135 mmol/L (ref 135–145)
Total Bilirubin: 0.9 mg/dL (ref 0.3–1.2)
Total Protein: 5.7 g/dL — ABNORMAL LOW (ref 6.5–8.1)

## 2020-12-13 LAB — CBC WITH DIFFERENTIAL/PLATELET
Abs Immature Granulocytes: 0.22 10*3/uL — ABNORMAL HIGH (ref 0.00–0.07)
Basophils Absolute: 0.1 10*3/uL (ref 0.0–0.1)
Basophils Relative: 3 %
Eosinophils Absolute: 0 10*3/uL (ref 0.0–0.5)
Eosinophils Relative: 1 %
HCT: 33.7 % — ABNORMAL LOW (ref 39.0–52.0)
Hemoglobin: 11.5 g/dL — ABNORMAL LOW (ref 13.0–17.0)
Immature Granulocytes: 11 %
Lymphocytes Relative: 28 %
Lymphs Abs: 0.6 10*3/uL — ABNORMAL LOW (ref 0.7–4.0)
MCH: 28.9 pg (ref 26.0–34.0)
MCHC: 34.1 g/dL (ref 30.0–36.0)
MCV: 84.7 fL (ref 80.0–100.0)
Monocytes Absolute: 0.6 10*3/uL (ref 0.1–1.0)
Monocytes Relative: 29 %
Neutro Abs: 0.5 10*3/uL — ABNORMAL LOW (ref 1.7–7.7)
Neutrophils Relative %: 28 %
Platelets: 138 10*3/uL — ABNORMAL LOW (ref 150–400)
RBC: 3.98 MIL/uL — ABNORMAL LOW (ref 4.22–5.81)
RDW: 13.7 % (ref 11.5–15.5)
WBC: 1.9 10*3/uL — ABNORMAL LOW (ref 4.0–10.5)
nRBC: 1 % — ABNORMAL HIGH (ref 0.0–0.2)

## 2020-12-13 LAB — URINE CULTURE: Culture: NO GROWTH

## 2020-12-13 LAB — PREALBUMIN: Prealbumin: 11.2 mg/dL — ABNORMAL LOW (ref 18–38)

## 2020-12-13 NOTE — Progress Notes (Signed)
PROGRESS NOTE    Jonathan Lewis  [id number] DOB: 30-Nov-1940 DOA: 12/10/2020 PCP: Biagio Borg, MD    Chief Complaint  Patient presents with   Fever    Brief Narrative:  Jonathan Lewis is a 80 y.o. male with medical history significant of stage IIIa adenocarcinoma of the stomach.  Patient had a distal gastrectomy on 03/2019.  He began treatment with FOLFOX via Dr. Marin Olp.  Patient then developed a biliary obstruction from a periportal lymph node.  He had radiation as well as drain placement. He presents to ED with fever. CT of the abdomen pelvis shows  Unchanged intrahepatic biliary ductal dilatation with no new or progressive dilatation. Multiple peritoneal metastatic deposits are unchanged. Stable hypodense lesion in hepatic segment IVB. He was admitted for evaluation of neutropenic fevers. Patient reports pain during urination.  Assessment & Plan:   Active Problems:   Constipation   Weight loss   Gastric cancer pT3pN2 s/p distal gastrectomy with Billroth II gastrojejunostomy 03/19/2019   Hyponatremia   Neutropenic fever (HCC)   Hyperkalemia   Neutropenic fever S/p Granix injection Absolute neutrophil count is improving.  CT of the abdomen and pelvis does not show any new lesions at this time. Monitor the patient empirically on IV cefepime, continue with IV fluids at this time. Follow blood cultures, negative so far,. Biliary fluid showing gram-positive cocci and gram-negative rods.  Final cultures report is negative.  Patient reports pain during urination but urine analysis is negative.  Urine cultures ordered, have been negative so far.     Gastric cancer s/p gastrectomy, s/p chemotherapy.  Oncology on board and appreciate recommendations.   Constipation Continue with Senokot.   Mild hyponatremia Probably from dehydration. Resolved.  Continue to monitor. If no improvement wills tart work up for SIADH.    Hypokalemia  Replaced, repeat level wnl.      Mildly elevated liver enzymes.  Improving. ? Mets vs from chemo.    Anemia of chronic disease Baseline hemoglobin between 9-10 Hemoglobin dropped to 8.5.  2 units of prbc transfusion ordered as his hemoglobin continues to drop. It improved to 11.5.    Pancytopenia Probably secondary to chemotherapy Improving.    DVT prophylaxis: (Lovenox) Code Status: DNR Family Communication: none at bedside.  Disposition:   Status is: Inpatient  Remains inpatient appropriate because:Unsafe d/c plan and IV treatments appropriate due to intensity of illness or inability to take PO  Dispo: The patient is from: Home              Anticipated d/c is to:  pending.              Patient currently is not medically stable to d/c.   Difficult to place patient No       Consultants:  Oncology Palliative care.   Procedures: none.   Antimicrobials:  Antibiotics Given (last 72 hours)     Date/Time Action Medication Dose Rate   12/10/20 1817 New Bag/Given   ceFEPIme (MAXIPIME) 2 g in sodium chloride 0.9 % 100 mL IVPB 2 g 200 mL/hr   12/11/20 0057 New Bag/Given   ceFEPIme (MAXIPIME) 2 g in sodium chloride 0.9 % 100 mL IVPB 2 g 200 mL/hr   12/11/20 1014 New Bag/Given   ceFEPIme (MAXIPIME) 2 g in sodium chloride 0.9 % 100 mL IVPB 2 g 200 mL/hr   12/11/20 1715 New Bag/Given   ceFEPIme (MAXIPIME) 2 g in sodium chloride 0.9 % 100 mL IVPB 2 g 200 mL/hr  12/12/20 0532 New Bag/Given   ceFEPIme (MAXIPIME) 2 g in sodium chloride 0.9 % 100 mL IVPB 2 g 200 mL/hr   12/12/20 1413 New Bag/Given   ceFEPIme (MAXIPIME) 2 g in sodium chloride 0.9 % 100 mL IVPB 2 g 200 mL/hr   12/12/20 2204 New Bag/Given   ceFEPIme (MAXIPIME) 2 g in sodium chloride 0.9 % 100 mL IVPB 2 g 200 mL/hr   12/13/20 0603 New Bag/Given   ceFEPIme (MAXIPIME) 2 g in sodium chloride 0.9 % 100 mL IVPB 2 g 200 mL/hr   12/13/20 1423 New Bag/Given   ceFEPIme (MAXIPIME) 2 g in sodium chloride 0.9 % 100 mL IVPB 2 g 200 mL/hr          Subjective: Reports feeling tired. No chest pain or sob.   Objective: Vitals:   12/12/20 1809 12/12/20 2106 12/12/20 2300 12/13/20 0601  BP: '96/75 90/71 91/70 '$ 103/71  Pulse: 84 77 74 77  Resp: 15 16    Temp: 97.6 F (36.4 C) 98.1 F (36.7 C)  98.5 F (36.9 C)  TempSrc: Oral Oral  Oral  SpO2: 100% 100%  100%  Weight:      Height:        Intake/Output Summary (Last 24 hours) at 12/13/2020 1559 Last data filed at 12/13/2020 1524 Gross per 24 hour  Intake 3067.75 ml  Output 1075 ml  Net 1992.75 ml    Filed Weights   12/10/20 1245 12/10/20 2100  Weight: 68 kg 67.5 kg    Examination: General exam: elderly gentleman, moderately Deconditioned, sitting in the chair, not in distress.  Respiratory system: Clear to auscultation. Respiratory effort normal. Cardiovascular system: S1 & S2 heard, RRR. No JVD, murmurs, No pedal edema. Gastrointestinal system: Abdomen is soft, Non tender, Non distended, Bowel sounds wnl Central nervous system: Alert and oriented. No focal neurological deficits. Extremities: no pedal edema.  Skin: No rashes, lesions or ulcers Psychiatry: Mood is appropriate.       Data Reviewed: I have personally reviewed following labs and imaging studies  CBC: Recent Labs  Lab 12/08/20 1631 12/10/20 1254 12/11/20 0500 12/12/20 0500 12/13/20 0604  WBC 1.4* 0.9* 0.6* 0.7* 1.9*  NEUTROABS 0.9* 0.5*  --  0.1* 0.5*  HGB 11.2* 11.7* 9.5* 8.5* 11.5*  HCT 33.9* 35.3* 28.2* 25.5* 33.7*  MCV 85.8 84.9 84.4 84.7 84.7  PLT 198 218 155 129* 138*     Basic Metabolic Panel: Recent Labs  Lab 12/08/20 1631 12/10/20 1254 12/11/20 0500 12/12/20 0500 12/13/20 0604  NA 130* 130* 132* 130* 135  K 4.7 5.2* 4.2 3.4* 3.5  CL 100 101 104 103 106  CO2 24 22 21* 23 22  GLUCOSE 127* 118* 91 92 101*  BUN '18 15 12 10 10  '$ CREATININE 0.72 0.69 0.62 0.57* 0.50*  CALCIUM 8.7* 9.1 8.4* 8.0* 8.3*     GFR: Estimated Creatinine Clearance: 69.1 mL/min (A) (by C-G  formula based on SCr of 0.5 mg/dL (L)).  Liver Function Tests: Recent Labs  Lab 12/08/20 1631 12/10/20 1254 12/11/20 0500 12/12/20 0500 12/13/20 0604  AST 62* 76* 66* 44* 32  ALT 88* 125* 113* 88* 68*  ALKPHOS 193* 211* 164* 140* 134*  BILITOT 0.9 1.1 1.2 0.9 0.9  PROT 7.1 7.9 6.3* 5.6* 5.7*  ALBUMIN 3.0* 3.4* 2.6* 2.4* 2.4*     CBG: No results for input(s): GLUCAP in the last 168 hours.   Recent Results (from the past 240 hour(s))  Culture, blood (Routine x 2)  Status: None (Preliminary result)   Collection Time: 12/10/20 12:54 PM   Specimen: BLOOD  Result Value Ref Range Status   Specimen Description   Final    BLOOD LEFT ANTECUBITAL Performed at [facility] [address], [place], [place] [address]    Special Requests   Final    BOTTLES DRAWN AEROBIC AND ANAEROBIC Blood Culture adequate volume Performed at [facility] [address] [place], [place] [address]    Culture   Final    NO GROWTH 2 DAYS Performed at [facility] [address] [place], [place] [address]    Report Status PENDING  Incomplete  Culture, blood (Routine x 2)     Status: None (Preliminary result)   Collection Time: 12/10/20  1:40 PM   Specimen: BLOOD  Result Value Ref Range Status   Specimen Description   Final    BLOOD LEFT ANTECUBITAL Performed at [facility] [address], [place], [place] [address]    Special Requests   Final    BOTTLES DRAWN AEROBIC AND ANAEROBIC Blood Culture results may not be optimal due to an inadequate volume of blood received in culture bottles Performed at [facility] [address], [place], [place] [address]    Culture   Final    NO GROWTH 2 DAYS Performed at [facility] [address] [place], [place] [address]    Report Status PENDING  Incomplete  Resp Panel by RT-PCR (Flu A&B, Covid) Nasopharyngeal Swab     Status: None   Collection Time:  12/10/20  1:54 PM   Specimen: Nasopharyngeal Swab; Nasopharyngeal(NP) swabs in vial transport medium  Result Value Ref Range Status   SARS Coronavirus 2 by RT PCR NEGATIVE NEGATIVE Final    Comment: (NOTE) SARS-CoV-2 target nucleic acids are NOT DETECTED.  The SARS-CoV-2 RNA is generally detectable in upper respiratory specimens during the acute phase of infection. The lowest concentration of SARS-CoV-2 viral copies this assay can detect is 138 copies/mL. A negative result does not preclude SARS-Cov-2 infection and should not be used as the sole basis for treatment or other patient management decisions. A negative result may occur with  improper specimen collection/handling, submission of specimen other than nasopharyngeal swab, presence of viral mutation(s) within the areas targeted by this assay, and inadequate number of viral copies(<138 copies/mL). A negative result must be combined with clinical observations, patient history, and epidemiological information. The expected result is Negative.  Fact Sheet for Patients:  EntrepreneurPulse.com.au  Fact Sheet for Healthcare Providers:  IncredibleEmployment.be  This test is no t yet approved or cleared by the [place] FDA and  has been authorized for detection and/or diagnosis of SARS-CoV-2 by FDA under an Emergency Use Authorization (EUA). This EUA will remain  in effect (meaning this test can be used) for the duration of the COVID-19 declaration under Section 564(b)(1) of the Act, 21 U.S.C.section 360bbb-3(b)(1), unless the authorization is terminated  or revoked sooner.       Influenza A by PCR NEGATIVE NEGATIVE Final   Influenza B by PCR NEGATIVE NEGATIVE Final    Comment: (NOTE) The Xpert Xpress SARS-CoV-2/FLU/RSV plus assay is intended as an aid in the diagnosis of influenza from Nasopharyngeal swab specimens and should not be used as a sole basis for treatment. Nasal washings  and aspirates are unacceptable for Xpert Xpress SARS-CoV-2/FLU/RSV testing.  Fact Sheet for Patients: EntrepreneurPulse.com.au  Fact Sheet for Healthcare Providers: IncredibleEmployment.be  This test is not yet approved or cleared by the [place] and has been authorized for detection and/or diagnosis of SARS-CoV-2 by FDA under an Emergency Use Authorization (EUA). This EUA will remain in effect (meaning this test can be used) for the duration of the COVID-19 declaration under Section 564(b)(1) of the Act, 21 U.S.C. section 360bbb-3(b)(1), unless the authorization is terminated or revoked.  Performed at [facility], [place] [address], [place], [place] [address]   Body fluid culture w Gram Stain     Status: Abnormal (Preliminary result)   Collection Time: 12/11/20  7:13 AM   Specimen: BILE; Body Fluid  Result Value Ref Range Status   Specimen Description   Final    BILE Performed at [facility] [address], [place], [place] [address]    Special Requests   Final    Immunocompromised Performed at [facility], [place] [address], [place], [place] [address]    Gram Stain   Final    WBC PRESENT, PREDOMINANTLY MONONUCLEAR GRAM POSITIVE COCCI IN CHAINS IN CLUSTERS GRAM NEGATIVE RODS CYTOSPIN SMEAR    Culture (A)  Final    MULTIPLE ORGANISMS PRESENT, NONE PREDOMINANT NO STAPHYLOCOCCUS AUREUS ISOLATED NO GROUP A STREP (S.PYOGENES) ISOLATED Performed at [facility], [place] [address] [place], [place] [address]    Report Status PENDING  Incomplete  Urine Culture     Status: None   Collection Time: 12/11/20  5:25 PM   Specimen: Urine, Clean Catch  Result Value Ref Range Status   Specimen Description   Final    URINE, CLEAN CATCH Performed at [facility], [place] [address], [place], [place] [address]    Special Requests   Final    NONE Performed at [facility], [place] [address] [place], [place] [address]    Culture   Final    NO GROWTH Performed at [facility], [place] [address], [place], [place] [address]    Report Status 12/13/2020 FINAL  Final          Radiology Studies: No results found.      Scheduled Meds:  Chlorhexidine Gluconate Cloth  6 each Topical Daily   dorzolamide-timolol  1 drop Both Eyes BID   enoxaparin (LOVENOX) injection  40 mg Subcutaneous Q24H   famotidine  40 mg Oral BID   feeding supplement  237 mL Oral TID BM   latanoprost  1 drop Both Eyes QHS   magic mouthwash  5 mL Oral QID   multivitamin with minerals  1 tablet Oral Daily   phenazopyridine  100 mg Oral TID WC   polyethylene glycol  17 g Oral BID   senna-docusate  2 tablet Oral BID   Tbo-Filgrastim  480 mcg Subcutaneous q1800   Continuous Infusions:  sodium chloride 100 mL/hr at 12/12/20 2346   ceFEPime (MAXIPIME) IV 2 g (12/13/20 1423)     LOS: 3 days        Hosie Poisson, MD [facility]   To contact the attending provider between 7A-7P or the covering provider during after hours 7P-7A, please log into the web site [email or web address] and access using universal [place] password for that web site. If you do not have the password, please call the hospital operator.  12/13/2020, 3:59 PM

## 2020-12-13 NOTE — Consult Note (Signed)
Consultation Note Date: 12/13/2020   Patient Name: Jonathan Lewis  DOB: Feb 10, 1941  MRN: [id number]  Age / Sex: 80 y.o., male  PCP: Biagio Borg, MD Referring Physician: Hosie Poisson, MD  Reason for Consultation: Establishing goals of care and Psychosocial/spiritual support  HPI/Patient Profile: 80 y.o. male   admitted on 12/10/2020 with  medical history significant for  stage IIIa adenocarcinoma of the stomach.  Patient had a distal gastrectomy on 03/2019.  He began treatment with FOLFOX via Dr. Marin Olp.  Patient then developed a biliary obstruction from a periportal lymph node.  He had radiation as well as drain placement.  On 9/6 he was seen in the ER with constipation and drainage from his abdominal tubes.  Patient had a CT scan that showed constipation.  He was started on senna and MiraLAX.    He recently finished cycle 8 of his chemotherapy.  Patient was transfused with 2 units of whole blood yesterday.  He is on Neupogen WBC count is 1.9  In the ER, his labs were reflective of neutropenia.  He had a CT scan that showed moderate stool burden and drains in place.  He was cultured and ER physician spoke with oncologist who suggested starting cefepime.    Today the patient is feeling better, tolerating orals better.  Patient and family face treatment option decisions, advanced directive decisions and anticipatory care needs.   Clinical Assessment and Goals of Care:   This NP Wadie Lessen reviewed medical records, received report from team, assessed the patient and then meet at the patient's bedside along with his wife to discuss diagnosis, prognosis, GOC, EOL wishes disposition and options.   Concept of Palliative Care was introduced as specialized medical care for people and their families living with serious illness.  If focuses on providing relief from the symptoms and stress of a serious illness.   The goal is to improve quality of life for both the patient and the family.   Values and goals of care important to patient and family were attempted to be elicited.  Created space and opportunity for patient  and family to explore thoughts and feelings regarding current medical situation.  Both patient and his wife verbalize that they have been managing at home up to this point however [name] has had slow physical and functional decline over the past several months.    Patient's oral nutritional intake is poor, he reports weight loss, currently is albumin is 2.4.    Education offered on concepts of eating 6 small meals versus 3 large meals a day, and increasing proteins through supplemental shakes.   A  discussion was had today regarding advanced directives.  Concepts specific to code status, artifical feeding and hydration, continued IV antibiotics and rehospitalization was had.    The difference between a aggressive medical intervention path  and a palliative comfort care path for this patient at this time was had.     MOST form introduced.  At this time patient is open to all offered  and available medical interventions to prolong life.  He looks to Dr. Ennever/oncologist to direct decisions regarding next steps in his cancer care.     Questions and concerns addressed.  Patient  encouraged to call with questions or concerns.     PMT will continue to support holistically.          No documented HPOA, if the patient was unable to make his own decisions he would defer to his wife to make decisions for him      SUMMARY OF RECOMMENDATIONS    Code Status/Advance Care Planning: DNR   Symptom Management:  Pain currently managed on as needed Tylenol and Ultram  Palliative Prophylaxis:  Aspiration, Bowel Regimen, Delirium Protocol, Frequent Pain Assessment, and Oral Care  Additional Recommendations (Limitations, Scope, Preferences): Full Scope  Treatment  Psycho-social/Spiritual:  Desire for further Chaplaincy support:no- declined.  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 Additional Recommendations: emotional support offered  Prognosis:  Unable to determine  Discharge Planning: Home.   Recommend OP Community based Palliative services to follow on discharge.  It will be important to continue conversations regarding treatment options and GOCs.       Primary Diagnoses: Present on Admission:  Neutropenic fever ([place])  Constipation  Gastric cancer pT3pN2 s/p distal gastrectomy with Billroth II gastrojejunostomy 03/19/2019  Weight loss  Hyponatremia   I have reviewed the medical record, interviewed the patient and family, and examined the patient. The following aspects are pertinent.  Past Medical History:  Diagnosis Date   Acoustic neuroma ([facility]) 06/25/2015   Allergic rhinitis 07/18/2014   Allergy    Anemia    Arthritis    BPH (benign prostatic hyperplasia)    Cancer ([place])    stage III stomach-Dx 03/2019   Carbuncle    recurrent MRSA carbuncles   Cataract    Per pt bilateral cataracts removed.   Diabetes mellitus    Type II. Per pt Dr. Jenny Reichmann took him off his dm med.   Disk prolapse    Glaucoma    Hyperlipidemia    Hypertension    Male hypogonadism 07/16/2014   Neuropathy    Pneumonia    Sinus bradycardia    chronic, asymptomatic   Sinusitis 07/29/2012   Social History   Socioeconomic History   Marital status: Married    Spouse name: Not on file   Number of children: 7   Years of education: 7   Highest education level: Not on file  Occupational History   Occupation: Mining engineer: RETIRED  Tobacco Use   Smoking status: Former    Packs/day: 1.00    Years: 24.00    Pack years: 24.00    Types: Cigarettes    Quit date: 10/03/1978    Years since quitting: 42.2   Smokeless tobacco: Never  Vaping Use   Vaping Use: Never used   Substance and Sexual Activity   Alcohol use: Not Currently   Drug use: No   Sexual activity: Yes    Partners: Female  Other Topics Concern   Not on file  Social History Narrative   9th grade.  Married '60. Jonathan Lewis 3 sons -'58 '64, '66; 4 dtrs -'60, '62, '61, '65; 15 grands, 25 great-grands.   Jonathan Lewis, some cleaning work. Lives alone with wife.   ACD- discussed living will and HCPOA (July '14) provided packet.          Social  Determinants of Health   Financial Resource Strain: Not on file  Food Insecurity: Not on file  Transportation Needs: Not on file  Physical Activity: Not on file  Stress: Not on file  Social Connections: Not on file   Family History  Problem Relation Age of Onset   Cancer Mother    Hypertension Mother    Diabetes Sister    Cancer Brother        colon   Hypertension Brother    Hyperlipidemia Brother    Stroke Brother    Hypertension Sister    Colon cancer Neg Hx    Heart attack Neg Hx    Sudden death Neg Hx    Esophageal cancer Neg Hx    Pancreatic cancer Neg Hx    Stomach cancer Neg Hx    Rectal cancer Neg Hx    Scheduled Meds:  Chlorhexidine Gluconate Cloth  6 each Topical Daily   dorzolamide-timolol  1 drop Both Eyes BID   enoxaparin (LOVENOX) injection  40 mg Subcutaneous Q24H   famotidine  40 mg Oral BID   feeding supplement  237 mL Oral TID BM   latanoprost  1 drop Both Eyes QHS   magic mouthwash  5 mL Oral QID   multivitamin with minerals  1 tablet Oral Daily   phenazopyridine  100 mg Oral TID WC   polyethylene glycol  17 g Oral BID   senna-docusate  2 tablet Oral BID   Tbo-Filgrastim  480 mcg Subcutaneous q1800   Continuous Infusions:  sodium chloride 100 mL/hr at 12/12/20 2346   ceFEPime (MAXIPIME) IV 2 g (12/13/20 0603)   PRN Meds:.acetaminophen **OR** acetaminophen, meclizine, ondansetron **OR** ondansetron (ZOFRAN) IV, polyvinyl alcohol, traMADol Medications Prior to Admission:  Prior to Admission medications   Medication  Sig Start Date End Date Taking? Authorizing Provider  atorvastatin (LIPITOR) 20 MG tablet Take 20 mg by mouth at bedtime. 10/30/20  Yes [provider]  dexamethasone (DECADRON) 4 MG tablet Take 2 tablets (8 mg total) by mouth daily. Start the day after carboplatin chemotherapy for 3 days. Patient taking differently: Take 8 mg by mouth See admin instructions. Take 8 mg by mouth daily- Start the day after carboplatin chemotherapy for 3 days. 11/16/20  Yes Volanda Napoleon, MD  dorzolamide-timolol (COSOPT) 22.3-6.8 MG/ML ophthalmic solution Place 1 drop into both eyes 2 (two) times daily. 06/15/20  Yes [provider]  famotidine (PEPCID) 20 MG tablet TAKE 1 TABLET BY MOUTH EVERY DAY Patient taking differently: Take 20 mg by mouth daily. 12/08/20  Yes Ennever, Rudell Cobb, MD  latanoprost (XALATAN) 0.005 % ophthalmic solution Place 1 drop into both eyes at bedtime.  01/09/19  Yes [provider]  lidocaine-prilocaine (EMLA) cream Apply 1 application topically as needed. Place on the port one hour before appointment. Patient taking differently: Apply 1 application topically daily as needed (as directed- Place on the port one hour before appointment). 08/24/20  Yes Volanda Napoleon, MD  meclizine (ANTIVERT) 12.5 MG tablet TAKE 1 TABLET BY MOUTH 3 TIMES DAILY AS NEEDED FOR DIZZINESS. Patient taking differently: Take 12.5 mg by mouth daily as needed for dizziness. 08/10/20  Yes Biagio Borg, MD  Multiple Vitamin (MULTIVITAMIN WITH MINERALS) TABS tablet Take 1 tablet by mouth in the morning. Men's One-A-Day Chewable   Yes [provider]  ondansetron (ZOFRAN) 8 MG tablet Take 1 tablet (8 mg total) by mouth every 8 (eight) hours as needed for nausea or vomiting. 09/16/20  Yes  Hayden Pedro, PA-C  potassium chloride (KLOR-CON) 20 MEQ packet Take 20 mEq by mouth 2 (two) times daily. 12/08/20 01/07/21 Yes Isla Pence, MD  senna-docusate (SENOKOT S) 8.6-50 MG tablet Take 1 tablet  by mouth daily as needed for mild constipation. 12/08/20  Yes Isla Pence, MD  SYSTANE ULTRA 0.4-0.3 % SOLN Place 1 drop into both eyes 3 (three) times daily as needed (dry eyes). 01/09/19  Yes [provider]   No Known Allergies Review of Systems  Neurological:  Positive for weakness.   Physical Exam Cardiovascular:     Rate and Rhythm: Normal rate.  Skin:    General: Skin is warm and dry.  Neurological:     Mental Status: He is alert.    Vital Signs: BP 103/71 (BP Location: Right Arm)   Pulse 77   Temp 98.5 F (36.9 C) (Oral)   Resp 16   Ht '5\' 8"'$  (1.727 m)   Wt 67.5 kg   SpO2 100%   BMI 22.63 kg/m  Pain Scale: 0-10   Pain Score: 6    SpO2: SpO2: 100 % O2 Device:SpO2: 100 % O2 Flow Rate: .   IO: Intake/output summary:  Intake/Output Summary (Last 24 hours) at 12/13/2020 1205 Last data filed at 12/13/2020 0809 Gross per 24 hour  Intake 3089.93 ml  Output 1875 ml  Net 1214.93 ml    LBM: Last BM Date: 12/13/20 Baseline Weight: Weight: 68 kg Most recent weight: Weight: 67.5 kg     Palliative Assessment/Data:  50 % at best   Discussed with Dr Karleen Hampshire   Time In:  1300 Time Out: 1415 Time Total: 75 minutes Greater than 50%  of this time was spent counseling and coordinating care related to the above assessment and plan.  Signed by: Wadie Lessen, NP   Please contact Palliative Medicine Team phone at [phone] for questions and concerns.  For individual provider: See Shea Evans

## 2020-12-13 NOTE — Evaluation (Signed)
Physical Therapy Evaluation Patient Details Name: Jonathan Lewis MRN: [id number] DOB: 1940-06-07 Today's Date: 12/13/2020   History of Present Illness  80 yo male admitted with neutropenic fever. Hx of met gastric ca, (2) biliary drains, C3-C6 ACDF  Clinical Impression  On eval, pt was Min guard-Min A for mobility. He walked ~350 feet around the unit. Unsteady at times, especially without UE support. He tolerated activity well and he is motivated to mobilize. Discussed d/c plan-he will return home where he lives with his wife. They are agreeable to HHPT f/u.     Follow Up Recommendations Home health PT    Equipment Recommendations  None recommended by PT    Recommendations for Other Services       Precautions / Restrictions Precautions Precautions: Fall Restrictions Weight Bearing Restrictions: No      Mobility  Bed Mobility Overal bed mobility: Modified Independent                  Transfers Overall transfer level: Needs assistance   Transfers: Sit to/from Stand Sit to Stand: Supervision            Ambulation/Gait Ambulation/Gait assistance: Min assist;Min guard Gait Distance (Feet): 350 Feet Assistive device: IV Pole;None       General Gait Details: walked ~250 feet with IV pole then ~75 feet without a device. Intermittent Min A while walking without support. Otherwise, he was Min guard. Fair gait speed. Denied lightheadedness  Stairs            Wheelchair Mobility    Modified Rankin (Stroke Patients Only)       Balance Overall balance assessment: Mild deficits observed, not formally tested                                           Pertinent Vitals/Pain Pain Assessment: No/denies pain    Home Living Family/patient expects to be discharged to:: Private residence Living Arrangements: Spouse/significant other Available Help at Discharge: Family;Available 24 hours/day Type of Home: House Home Access: Stairs to  enter Entrance Stairs-Rails: Psychiatric nurse of Steps: 4 Home Layout: One level Home Equipment: Walker - 2 wheels;Cane - single point      Prior Function Level of Independence: Independent         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    Extremity/Trunk Assessment   Upper Extremity Assessment Upper Extremity Assessment: Overall WFL for tasks assessed    Lower Extremity Assessment Lower Extremity Assessment: Generalized weakness    Cervical / Trunk Assessment Cervical / Trunk Assessment: Normal  Communication   Communication: No difficulties  Cognition Arousal/Alertness: Awake/alert Behavior During Therapy: WFL for tasks assessed/performed Overall Cognitive Status: Within Functional Limits for tasks assessed                                        General Comments      Exercises     Assessment/Plan    PT Assessment Patient needs continued PT services  PT Problem List Decreased strength;Decreased mobility;Decreased balance;Decreased knowledge of use of DME       PT Treatment Interventions DME instruction;Gait training;Therapeutic exercise;Balance training;Functional mobility training;Therapeutic activities;Patient/family education    PT Goals (Current goals can be found in the  Care Plan section)  Acute Rehab PT Goals Patient Stated Goal: to get better PT Goal Formulation: With patient/family Time For Goal Achievement: 12/27/20 Potential to Achieve Goals: Good    Frequency Min 3X/week   Barriers to discharge        Co-evaluation               AM-PAC PT "6 Clicks" Mobility  Outcome Measure Help needed turning from your back to your side while in a flat bed without using bedrails?: None Help needed moving from lying on your back to sitting on the side of a flat bed without using bedrails?: None Help needed moving to and from a bed to a chair (including a wheelchair)?: A  Little Help needed standing up from a chair using your arms (e.g., wheelchair or bedside chair)?: A Little Help needed to walk in hospital room?: A Little Help needed climbing 3-5 steps with a railing? : A Little 6 Click Score: 20    End of Session   Activity Tolerance: Patient tolerated treatment well Patient left: in chair;with call bell/phone within reach;with family/visitor present   PT Visit Diagnosis: Unsteadiness on feet (R26.81);Muscle weakness (generalized) (M62.81)    Time: 0923-3007 PT Time Calculation (min) (ACUTE ONLY): 14 min   Charges:   PT Evaluation $PT Eval Moderate Complexity: 1 Mod             Doreatha Massed, PT Acute Rehabilitation  Office: [phone] Pager: [phone]

## 2020-12-14 ENCOUNTER — Inpatient Hospital Stay (HOSPITAL_COMMUNITY): Payer: Medicare PPO

## 2020-12-14 DIAGNOSIS — N400 Enlarged prostate without lower urinary tract symptoms: Secondary | ICD-10-CM

## 2020-12-14 DIAGNOSIS — R319 Hematuria, unspecified: Secondary | ICD-10-CM | POA: Diagnosis not present

## 2020-12-14 DIAGNOSIS — R531 Weakness: Secondary | ICD-10-CM | POA: Diagnosis not present

## 2020-12-14 DIAGNOSIS — C161 Malignant neoplasm of fundus of stomach: Secondary | ICD-10-CM | POA: Diagnosis not present

## 2020-12-14 DIAGNOSIS — E875 Hyperkalemia: Secondary | ICD-10-CM | POA: Diagnosis not present

## 2020-12-14 DIAGNOSIS — R634 Abnormal weight loss: Secondary | ICD-10-CM | POA: Diagnosis not present

## 2020-12-14 DIAGNOSIS — C162 Malignant neoplasm of body of stomach: Secondary | ICD-10-CM | POA: Diagnosis not present

## 2020-12-14 LAB — CBC WITH DIFFERENTIAL/PLATELET
Abs Immature Granulocytes: 0.57 10*3/uL — ABNORMAL HIGH (ref 0.00–0.07)
Basophils Absolute: 0 10*3/uL (ref 0.0–0.1)
Basophils Relative: 0 %
Eosinophils Absolute: 0 10*3/uL (ref 0.0–0.5)
Eosinophils Relative: 0 %
HCT: 34.7 % — ABNORMAL LOW (ref 39.0–52.0)
Hemoglobin: 11.4 g/dL — ABNORMAL LOW (ref 13.0–17.0)
Immature Granulocytes: 8 %
Lymphocytes Relative: 11 %
Lymphs Abs: 0.9 10*3/uL (ref 0.7–4.0)
MCH: 28.3 pg (ref 26.0–34.0)
MCHC: 32.9 g/dL (ref 30.0–36.0)
MCV: 86.1 fL (ref 80.0–100.0)
Monocytes Absolute: 1.2 10*3/uL — ABNORMAL HIGH (ref 0.1–1.0)
Monocytes Relative: 16 %
Neutro Abs: 5 10*3/uL (ref 1.7–7.7)
Neutrophils Relative %: 65 %
Platelets: 149 10*3/uL — ABNORMAL LOW (ref 150–400)
RBC: 4.03 MIL/uL — ABNORMAL LOW (ref 4.22–5.81)
RDW: 14.1 % (ref 11.5–15.5)
WBC: 7.6 10*3/uL (ref 4.0–10.5)
nRBC: 0.3 % — ABNORMAL HIGH (ref 0.0–0.2)

## 2020-12-14 LAB — BPAM RBC
Blood Product Expiration Date: 202210132359
Blood Product Expiration Date: 202210132359
ISSUE DATE / TIME: 202209101254
ISSUE DATE / TIME: 202209101529
Unit Type and Rh: 5100
Unit Type and Rh: 5100

## 2020-12-14 LAB — URINALYSIS, ROUTINE W REFLEX MICROSCOPIC
Bilirubin Urine: NEGATIVE
Glucose, UA: 100 mg/dL — AB
Hgb urine dipstick: NEGATIVE
Ketones, ur: NEGATIVE mg/dL
Leukocytes,Ua: NEGATIVE
Nitrite: POSITIVE — AB
Specific Gravity, Urine: 1.01 (ref 1.005–1.030)
pH: 5.5 (ref 5.0–8.0)

## 2020-12-14 LAB — TYPE AND SCREEN
ABO/RH(D): O POS
Antibody Screen: NEGATIVE
Unit division: 0
Unit division: 0

## 2020-12-14 LAB — COMPREHENSIVE METABOLIC PANEL
ALT: 51 U/L — ABNORMAL HIGH (ref 0–44)
AST: 26 U/L (ref 15–41)
Albumin: 2.3 g/dL — ABNORMAL LOW (ref 3.5–5.0)
Alkaline Phosphatase: 143 U/L — ABNORMAL HIGH (ref 38–126)
Anion gap: 7 (ref 5–15)
BUN: 10 mg/dL (ref 8–23)
CO2: 20 mmol/L — ABNORMAL LOW (ref 22–32)
Calcium: 8.1 mg/dL — ABNORMAL LOW (ref 8.9–10.3)
Chloride: 106 mmol/L (ref 98–111)
Creatinine, Ser: 0.62 mg/dL (ref 0.61–1.24)
GFR, Estimated: >60 mL/min (ref >60)
Glucose, Bld: 98 mg/dL (ref 70–99)
Potassium: 3.4 mmol/L — ABNORMAL LOW (ref 3.5–5.1)
Sodium: 133 mmol/L — ABNORMAL LOW (ref 135–145)
Total Bilirubin: 0.6 mg/dL (ref 0.3–1.2)
Total Protein: 5.6 g/dL — ABNORMAL LOW (ref 6.5–8.1)

## 2020-12-14 LAB — BODY FLUID CULTURE W GRAM STAIN

## 2020-12-14 MED ORDER — TAMSULOSIN HCL 0.4 MG PO CAPS
0.4000 mg | ORAL_CAPSULE | Freq: Every day | ORAL | Status: DC
  Administered 2020-12-14: 0.4 mg via ORAL
  Filled 2020-12-14: qty 1

## 2020-12-14 MED ORDER — HEPARIN SOD (PORK) LOCK FLUSH 100 UNIT/ML IV SOLN
500.0000 [IU] | INTRAVENOUS | Status: DC | PRN
  Filled 2020-12-14: qty 5

## 2020-12-14 MED ORDER — TAMSULOSIN HCL 0.4 MG PO CAPS: 0.4000 mg | ORAL_CAPSULE | Freq: Every day | ORAL | 1 refills | Status: AC

## 2020-12-14 MED ORDER — ENSURE ENLIVE PO LIQD: 237.0000 mL | Freq: Three times a day (TID) | ORAL | 2 refills | Status: AC

## 2020-12-14 NOTE — Care Management Important Message (Signed)
Important Message  Patient Details IM Letter placed in the Patient's room. Name: Jonathan Lewis MRN: [id number] Date of Birth: Jul 15, 1940   Medicare Important Message Given:  Yes     Kerin Salen 12/14/2020, 12:01 PM

## 2020-12-14 NOTE — Progress Notes (Signed)
[facility] ([facility])  Hospital Liaison: RN note         This patient has been referred to our palliative care services in the community.  [facility] will continue to follow for any discharge planning needs and to coordinate continuation of palliative care in the outpatient setting.    If you have questions or need assistance, please call [phone] or contact the hospital Liaison listed on AMION.      Thank you for this referral.         Mary Anne Robertson, RN, CCM  [facility] Hospital Liaison   [phone] 

## 2020-12-14 NOTE — Progress Notes (Signed)
   12/14/20 1100  Mobility  Activity Ambulated in hall  Level of Assistance Contact guard assist, steadying assist  Assistive Device Other (Comment) (IV pole)  Distance Ambulated (ft) 500 ft  Mobility Ambulated with assistance in hallway  Mobility Response Tolerated well  Mobility performed by Mobility specialist  $Mobility charge 1 Mobility   Pt ambulated in hall with IV pole about 566f, tolerated well. No other complaints. Left pt in chair with call bell at side. Notified RN of session.   [place] Acute Rehab Services Office: [phone]

## 2020-12-14 NOTE — Progress Notes (Signed)
Patient ID: [name], male   DOB: 1941/01/28, 80 y.o.   MRN: [id number]    Progress Note from the Palliative Medicine Team at [facility]   Patient Name: Jonathan Lewis        Date: 12/14/2020 DOB: 16-Jul-1940  Age: 80 y.o. MRN#: [id number] Attending Physician: Hosie Poisson, MD Primary Care Physician: Biagio Borg, MD Admit Date: 12/10/2020   Medical records reviewed   80 y.o. male   admitted on 12/10/2020 with past   medical history significant for  stage IIIa adenocarcinoma of the stomach.  Patient had a distal gastrectomy on 03/2019.  He began treatment with FOLFOX via Dr. Marin Olp.  Patient then developed a biliary obstruction from a periportal lymph node.  He had radiation as well as drain placement.   On 9/6 he was seen in the ER with constipation and drainage from his abdominal tubes.  Patient had a CT scan that showed constipation.  He was started on senna and MiraLAX.     He recently finished cycle 8 of his chemotherapy.  Patient received 2 units of whole blood.   This NP visited patient at the bedside as a follow up t for palliative medicine needs and emotional support.  Wife at bedside. Patient reports that he is feeling better and plan is to discharge home today.  Plan of Care -DNR/DNI -Patient is open to all offered and available medical interventions to prolong life -Patient plans to follow-up with Dr. Marin Olp for ongoing oncologic treatments -She and his family are hopeful for ongoing quality of life -OP Community based Palliative  ( will need ongoing discussion regarding GOCs)   discussed with transition of care team       MOST form introduced   Education offered to  patient on the importance of continued conversation with his  family and the medical providers regarding overall plan of care and treatment options,  ensuring decisions are within the context of the patients values and GOCs.  Encourage patient and family to call with questions or  concerns  Questions and concerns addressed   Discussed with Dr Karleen Hampshire and [facility] team   Total time spent on the unit was 20 minutes  Greater than 50% of the time was spent in counseling and coordination of care  Wadie Lessen NP  Palliative Medicine Team Team Phone # [phone] Pager [phone]

## 2020-12-14 NOTE — Progress Notes (Signed)
Thankfully, Mr. Austria is no longer neutropenic.  We will stop the Neupogen.  His white cell count is 7.6.  The biliary culture did not grow any predominant organism.  The problem now is that he has hematuria he says.  Apparently this is from try to get a Foley catheter into him.  This was not successful.  I am unsure of urology needs to see this.  I am not sure why he is having the problems with the urine.  He is eating.  He is having no nausea or vomiting.  He is having no abdominal pain.  Has biliary catheter is draining.  There is no cough or shortness of breath.  His white cell count is 7.6.  Hemoglobin 11.4.  Platelet count 149,000.  BUN is 10 creatinine 0.62.  Calcium is 8.1 with a albumin of 2.3.  His liver function studies are okay.  His blood sugar is 98.  Again, I am not sure why he has had urinary issues.  If it is not for this hematuria, I would think he would be able to go home.  His temperature is 97.7.  Pulse 84.  Blood pressure is 119/77.  His lungs are clear.  Cardiac exam regular rate and rhythm.  Abdomen is soft.  Biliary catheter is intact.  Bowel sounds are present.  There is no guarding or rebound tenderness.  Again, his neutropenia has resolved.  No cultures have been positive.  He is afebrile.  Hopefully, this hematuria is just self-limited.  I appreciate the great care that all the staff on [address] have given him.  Lattie Haw, MD  Psalm 64:10

## 2020-12-14 NOTE — TOC Transition Note (Signed)
Transition of Care [facility]) - CM/SW Discharge Note   Patient Details  Name: Jonathan Lewis MRN: [id number] Date of Birth: April 27, 1940  Transition of Care [facility]) CM/SW Contact:  Lynnell Catalan, RN Phone Number: 12/14/2020, 3:19 PM   Clinical Narrative:    [facility] consult for home palliative and home health PT. Spoke with pt at bedside and offered choice for both services. Pt states that he has had home health before but does not remember the company. [facility] chosen and referral called to [employer] Choice for home palliative given and [facility] chosen. [facility] liaison contacted for referral. Pt states that he has a 4 wheeled walker with seat at home and does not have any other equipment needs.   Final next level of care: [place] Barriers to Discharge: No Barriers Identified   Patient Goals and CMS Choice Patient states their goals for this hospitalization and ongoing recovery are:: To go home CMS Medicare.gov Compare Post Acute Care list provided to:: Patient Choice offered to / list presented to : Patient  Discharge Plan and Services    HH Arranged: PT [facility]: [place] Date [place]: 12/14/20 Time [place]: 1518 Representative spoke with at [place]: [place] ([place]) Interventions     Readmission Risk Interventions Readmission Risk Prevention Plan 09/30/2020 03/20/2019  Transportation Screening Complete Complete  PCP or Specialist Appt within 5-7 Days - Complete  PCP or Specialist Appt within 3-5 Days Complete -  Home Care Screening - Complete  Medication Review (RN CM) - Complete  HRI or Home Care Consult Not Complete -  [place] or Home Care Consult comments oncology -  Social Work Consult for [place] Planning/Counseling Not Complete -  SW consult not completed comments oncology -  Palliative Care Screening Not Applicable -  Medication Review (RN Care Manager) Not Complete -   Some recent data might be hidden

## 2020-12-15 LAB — CULTURE, BLOOD (ROUTINE X 2)
Culture: NO GROWTH
Culture: NO GROWTH
Special Requests: ADEQUATE

## 2020-12-16 ENCOUNTER — Telehealth: Payer: Self-pay

## 2020-12-16 DIAGNOSIS — Z4803 Encounter for change or removal of drains: Secondary | ICD-10-CM | POA: Diagnosis not present

## 2020-12-16 DIAGNOSIS — K5900 Constipation, unspecified: Secondary | ICD-10-CM | POA: Diagnosis not present

## 2020-12-16 DIAGNOSIS — D333 Benign neoplasm of cranial nerves: Secondary | ICD-10-CM | POA: Diagnosis not present

## 2020-12-16 DIAGNOSIS — D630 Anemia in neoplastic disease: Secondary | ICD-10-CM | POA: Diagnosis not present

## 2020-12-16 DIAGNOSIS — K831 Obstruction of bile duct: Secondary | ICD-10-CM | POA: Diagnosis not present

## 2020-12-16 DIAGNOSIS — I10 Essential (primary) hypertension: Secondary | ICD-10-CM | POA: Diagnosis not present

## 2020-12-16 DIAGNOSIS — C169 Malignant neoplasm of stomach, unspecified: Secondary | ICD-10-CM | POA: Diagnosis not present

## 2020-12-16 DIAGNOSIS — E1140 Type 2 diabetes mellitus with diabetic neuropathy, unspecified: Secondary | ICD-10-CM | POA: Diagnosis not present

## 2020-12-16 DIAGNOSIS — D709 Neutropenia, unspecified: Secondary | ICD-10-CM | POA: Diagnosis not present

## 2020-12-16 NOTE — Telephone Encounter (Signed)
Denise from [employer] called stating pateint was discharged from hospital with palliative and she needed a verbal okay from [place] to start him on palliative. Called her back and LM with the okay to start palliative care.

## 2020-12-16 NOTE — Telephone Encounter (Signed)
Attempted to contact patient's wife Pamala Hurry to schedule a Palliative Care consult appointment. No answer left a message to return call. Mychart message sent also.

## 2020-12-16 NOTE — Telephone Encounter (Signed)
Pt was discharge from hospital with a [facility] PT order.  Assessment done today.  Needing a verbal for a freq. of :  2* a week for 4 weeks 1* a week for 3 weeks.   FYI: 90/60 for b/p no dizziness  Roselie Awkward [phone]

## 2020-12-17 ENCOUNTER — Telehealth: Payer: Self-pay

## 2020-12-17 NOTE — Telephone Encounter (Signed)
Spoke with patient's granddaughter Ellwood Dense and scheduled an in-person Palliative Consult for 12/29/20 @ 10:30AM  with Dr. Hollace Kinnier. Documentation will be noted in [place].   COVID screening was negative. No pets in home. Patient lives with wife Pamala Hurry.  Consent obtained; updated Outlook/Netsmart/Team List and Epic.   Family is aware they may be receiving a call from provider the day before or day of to confirm appointment.

## 2020-12-17 NOTE — Telephone Encounter (Signed)
Ok for verbal 

## 2020-12-18 DIAGNOSIS — K5900 Constipation, unspecified: Secondary | ICD-10-CM | POA: Diagnosis not present

## 2020-12-18 DIAGNOSIS — D333 Benign neoplasm of cranial nerves: Secondary | ICD-10-CM | POA: Diagnosis not present

## 2020-12-18 DIAGNOSIS — D630 Anemia in neoplastic disease: Secondary | ICD-10-CM | POA: Diagnosis not present

## 2020-12-18 DIAGNOSIS — D709 Neutropenia, unspecified: Secondary | ICD-10-CM | POA: Diagnosis not present

## 2020-12-18 DIAGNOSIS — I10 Essential (primary) hypertension: Secondary | ICD-10-CM | POA: Diagnosis not present

## 2020-12-18 DIAGNOSIS — K831 Obstruction of bile duct: Secondary | ICD-10-CM | POA: Diagnosis not present

## 2020-12-18 DIAGNOSIS — C169 Malignant neoplasm of stomach, unspecified: Secondary | ICD-10-CM | POA: Diagnosis not present

## 2020-12-18 DIAGNOSIS — Z4803 Encounter for change or removal of drains: Secondary | ICD-10-CM | POA: Diagnosis not present

## 2020-12-18 DIAGNOSIS — E1140 Type 2 diabetes mellitus with diabetic neuropathy, unspecified: Secondary | ICD-10-CM | POA: Diagnosis not present

## 2020-12-18 NOTE — Telephone Encounter (Signed)
Spoke with Roselie Awkward and was able to give him the verbal OK per Dr. Jenny Reichmann for   2x a week for 4 weeks 1x a week for 3 weeks.

## 2020-12-22 DIAGNOSIS — D709 Neutropenia, unspecified: Secondary | ICD-10-CM | POA: Diagnosis not present

## 2020-12-22 DIAGNOSIS — Z4803 Encounter for change or removal of drains: Secondary | ICD-10-CM | POA: Diagnosis not present

## 2020-12-22 DIAGNOSIS — D630 Anemia in neoplastic disease: Secondary | ICD-10-CM | POA: Diagnosis not present

## 2020-12-22 DIAGNOSIS — K831 Obstruction of bile duct: Secondary | ICD-10-CM | POA: Diagnosis not present

## 2020-12-22 DIAGNOSIS — I10 Essential (primary) hypertension: Secondary | ICD-10-CM | POA: Diagnosis not present

## 2020-12-22 DIAGNOSIS — K5900 Constipation, unspecified: Secondary | ICD-10-CM | POA: Diagnosis not present

## 2020-12-22 DIAGNOSIS — C169 Malignant neoplasm of stomach, unspecified: Secondary | ICD-10-CM | POA: Diagnosis not present

## 2020-12-22 DIAGNOSIS — E1140 Type 2 diabetes mellitus with diabetic neuropathy, unspecified: Secondary | ICD-10-CM | POA: Diagnosis not present

## 2020-12-22 DIAGNOSIS — D333 Benign neoplasm of cranial nerves: Secondary | ICD-10-CM | POA: Diagnosis not present

## 2020-12-22 NOTE — Discharge Summary (Signed)
Physician Discharge Summary  Jonathan Lewis [id number] DOB: 03-May-1940 DOA: 12/10/2020  PCP: Biagio Borg, MD  Admit date: 12/10/2020 Discharge date: 12/14/2020  Admitted From: Home Disposition: Home.   Recommendations for Outpatient Follow-up:  Follow up with PCP in 1-2 weeks Please obtain BMP/CBC in one week Please follow up with oncology as recommended.   Discharge Condition:Stable.  CODE STATUS:DNR Diet recommendation: Heart Healthy  Brief/Interim Summary: Jonathan Lewis is a 80 y.o. male with medical history significant of stage IIIa adenocarcinoma of the stomach.  Patient had a distal gastrectomy on 03/2019.  He began treatment with FOLFOX via Dr. Marin Olp.  Patient then developed a biliary obstruction from a periportal lymph node.  He had radiation as well as drain placement. He presents to ED with fever. CT of the abdomen pelvis shows  Unchanged intrahepatic biliary ductal dilatation with no new or progressive dilatation. Multiple peritoneal metastatic deposits are unchanged. Stable hypodense lesion in hepatic segment IVB. He was admitted for evaluation of neutropenic fevers.  Discharge Diagnoses:  Active Problems:   Constipation   Weight loss   Gastric cancer pT3pN2 s/p distal gastrectomy with Billroth II gastrojejunostomy 03/19/2019   Hyponatremia   Neutropenic fever (HCC)   Hyperkalemia  Neutropenic fever S/p Granix injection Absolute neutrophil count is improving.  CT of the abdomen and pelvis does not show any new lesions at this time. He was started on IV antibiotics, but since cultures are negative, discontinued them on discharge.  Follow blood cultures, negative so far,. Biliary fluid showing gram-positive cocci and gram-negative rods.  Final cultures report is negative.  Patient reports pain during urination but urine analysis is negative.  Urine cultures ordered, have been negative so far.        Gastric cancer s/p gastrectomy, s/p chemotherapy.   Oncology on board and appreciate recommendations.     Constipation Continue with Senokot.     Mild hyponatremia Probably from dehydration. Resolved.  Continue to monitor. If no improvement wills tart work up for SIADH.      Hypokalemia  Replaced, repeat level wnl.      Mildly elevated liver enzymes.  Improving. ? Mets vs from chemo.       Anemia of chronic disease Baseline hemoglobin between 9-10 Hemoglobin dropped to 8.5.  2 units of prbc transfusion ordered as his hemoglobin continues to drop. It improved to 11.5.      Pancytopenia Probably secondary to chemotherapy Improving.     Discharge Instructions  Discharge Instructions     Ambulatory referral to Urology   Complete by: As directed    Diet - low sodium heart healthy   Complete by: As directed    Discharge instructions   Complete by: As directed    Please follow up with Urology in 1 to 2 weeks.  Please follow up with Oncology as recommended.      Allergies as of 12/14/2020   No Known Allergies      Medication List     TAKE these medications    atorvastatin 20 MG tablet Commonly known as: LIPITOR Take 20 mg by mouth at bedtime.   dexamethasone 4 MG tablet Commonly known as: DECADRON Take 2 tablets (8 mg total) by mouth daily. Start the day after carboplatin chemotherapy for 3 days. What changed:  when to take this additional instructions   dorzolamide-timolol 22.3-6.8 MG/ML ophthalmic solution Commonly known as: COSOPT Place 1 drop into both eyes 2 (two) times daily.   famotidine 20 MG tablet Commonly  known as: PEPCID TAKE 1 TABLET BY MOUTH EVERY DAY   feeding supplement Liqd Take 237 mLs by mouth 3 (three) times daily between meals.   latanoprost 0.005 % ophthalmic solution Commonly known as: XALATAN Place 1 drop into both eyes at bedtime.   lidocaine-prilocaine cream Commonly known as: EMLA Apply 1 application topically as needed. Place on the port one hour before  appointment. What changed:  when to take this reasons to take this additional instructions   meclizine 12.5 MG tablet Commonly known as: ANTIVERT TAKE 1 TABLET BY MOUTH 3 TIMES DAILY AS NEEDED FOR DIZZINESS. What changed: See the new instructions.   multivitamin with minerals Tabs tablet Take 1 tablet by mouth in the morning. Men's One-A-Day Chewable   ondansetron 8 MG tablet Commonly known as: ZOFRAN Take 1 tablet (8 mg total) by mouth every 8 (eight) hours as needed for nausea or vomiting.   potassium chloride 20 MEQ packet Commonly known as: Klor-Con Take 20 mEq by mouth 2 (two) times daily.   senna-docusate 8.6-50 MG tablet Commonly known as: Senokot S Take 1 tablet by mouth daily as needed for mild constipation.   Systane Ultra 0.4-0.3 % Soln Generic drug: Polyethyl Glycol-Propyl Glycol Place 1 drop into both eyes 3 (three) times daily as needed (dry eyes).   tamsulosin 0.4 MG Caps capsule Commonly known as: FLOMAX Take 1 capsule (0.4 mg total) by mouth daily.        Follow-up Information     Biagio Borg, MD. Schedule an appointment as soon as possible for a visit in 1 week(s).   Specialties: Internal Medicine, Radiology Contact information: [name] [phone]         Ceasar Mons, MD. Schedule an appointment as soon as possible for a visit in 1 week(s).   Specialty: Urology Contact information: [place] [address] [place] [address] [phone]         [facility], [facility] Follow up.   Specialty: Home Health Services Why: For home physical therapy Contact information: [place] [address] [phone]         [facility] Follow up.   Specialty: Hospice and Palliative Medicine Why: For home palliative services. Contact information: [place] [phone]               No Known  Allergies  Consultations: None.    Procedures/Studies: None.    Subjective: No new complaints.   Discharge Exam: Vitals:   12/14/20 0457 12/14/20 1524  BP: 119/77 94/65  Pulse: 84 85  Resp: 18 16  Temp: 97.7 F (36.5 C) (!) 97.4 F (36.3 C)  SpO2: 98% 99%   Vitals:   12/13/20 2059 12/13/20 2208 12/14/20 0457 12/14/20 1524  BP: 102/72  119/77 94/65  Pulse: 70  84 85  Resp: 19  18 16   Temp:  (!) 97.5 F (36.4 C) 97.7 F (36.5 C) (!) 97.4 F (36.3 C)  TempSrc:  Axillary Oral Oral  SpO2: 99%  98% 99%  Weight:      Height:        General: Pt is alert, awake, not in acute distress Cardiovascular: RRR, S1/S2 +, no rubs, no gallops Respiratory: CTA bilaterally, no wheezing, no rhonchi Abdominal: Soft, NT, ND, bowel sounds + Extremities: no edema, no cyanosis    The results of significant diagnostics from this hospitalization (including imaging, microbiology, ancillary and laboratory) are  listed below for reference.     Microbiology: No results found for this or any previous visit (from the past 240 hour(s)).   Labs: BNP (last 3 results) No results for input(s): BNP in the last 8760 hours. Basic Metabolic Panel: No results for input(s): NA, K, CL, CO2, GLUCOSE, BUN, CREATININE, CALCIUM, MG, PHOS in the last 168 hours. Liver Function Tests: No results for input(s): AST, ALT, ALKPHOS, BILITOT, PROT, ALBUMIN in the last 168 hours. No results for input(s): LIPASE, AMYLASE in the last 168 hours. No results for input(s): AMMONIA in the last 168 hours. CBC: No results for input(s): WBC, NEUTROABS, HGB, HCT, MCV, PLT in the last 168 hours. Cardiac Enzymes: No results for input(s): CKTOTAL, CKMB, CKMBINDEX, TROPONINI in the last 168 hours. BNP: Invalid input(s): POCBNP CBG: No results for input(s): GLUCAP in the last 168 hours. D-Dimer No results for input(s): DDIMER in the last 72 hours. Hgb A1c No results for input(s): HGBA1C in the last 72 hours. Lipid  Profile No results for input(s): CHOL, HDL, LDLCALC, TRIG, CHOLHDL, LDLDIRECT in the last 72 hours. Thyroid function studies No results for input(s): TSH, T4TOTAL, T3FREE, THYROIDAB in the last 72 hours.  Invalid input(s): FREET3 Anemia work up No results for input(s): VITAMINB12, FOLATE, FERRITIN, TIBC, IRON, RETICCTPCT in the last 72 hours. Urinalysis    Component Value Date/Time   COLORURINE ORANGE (A) 12/14/2020 1518   APPEARANCEUR CLEAR (A) 12/14/2020 1518   LABSPEC 1.010 12/14/2020 1518   PHURINE 5.5 12/14/2020 1518   GLUCOSEU 100 (A) 12/14/2020 1518   GLUCOSEU NEGATIVE 04/22/2020 1246   HGBUR NEGATIVE 12/14/2020 1518   HGBUR negative 11/30/2009 1450   BILIRUBINUR NEGATIVE 12/14/2020 1518   KETONESUR NEGATIVE 12/14/2020 1518   PROTEINUR TRACE (A) 12/14/2020 1518   UROBILINOGEN 0.2 04/22/2020 1246   NITRITE POSITIVE (A) 12/14/2020 1518   LEUKOCYTESUR NEGATIVE 12/14/2020 1518   Sepsis Labs Invalid input(s): PROCALCITONIN,  WBC,  LACTICIDVEN Microbiology No results found for this or any previous visit (from the past 240 hour(s)).   Time coordinating discharge: 38 minutes.  SIGNED:   Hosie Poisson, MD  [facility]

## 2020-12-23 ENCOUNTER — Other Ambulatory Visit: Payer: Self-pay | Admitting: Physician Assistant

## 2020-12-24 ENCOUNTER — Encounter: Payer: Self-pay | Admitting: Hematology & Oncology

## 2020-12-24 ENCOUNTER — Inpatient Hospital Stay: Payer: Medicare PPO

## 2020-12-24 ENCOUNTER — Inpatient Hospital Stay (HOSPITAL_BASED_OUTPATIENT_CLINIC_OR_DEPARTMENT_OTHER): Payer: Medicare PPO | Admitting: Hematology & Oncology

## 2020-12-24 ENCOUNTER — Other Ambulatory Visit: Payer: Self-pay

## 2020-12-24 ENCOUNTER — Other Ambulatory Visit: Payer: Self-pay | Admitting: Physician Assistant

## 2020-12-24 VITALS — BP 117/83 | HR 96 | Temp 97.8°F | Resp 18 | Ht 68.0 in | Wt 139.1 lb

## 2020-12-24 DIAGNOSIS — D500 Iron deficiency anemia secondary to blood loss (chronic): Secondary | ICD-10-CM

## 2020-12-24 DIAGNOSIS — Z5111 Encounter for antineoplastic chemotherapy: Secondary | ICD-10-CM | POA: Diagnosis not present

## 2020-12-24 DIAGNOSIS — Z923 Personal history of irradiation: Secondary | ICD-10-CM | POA: Diagnosis not present

## 2020-12-24 DIAGNOSIS — K921 Melena: Secondary | ICD-10-CM | POA: Diagnosis not present

## 2020-12-24 DIAGNOSIS — C160 Malignant neoplasm of cardia: Secondary | ICD-10-CM

## 2020-12-24 DIAGNOSIS — Z66 Do not resuscitate: Secondary | ICD-10-CM | POA: Diagnosis not present

## 2020-12-24 DIAGNOSIS — C162 Malignant neoplasm of body of stomach: Secondary | ICD-10-CM

## 2020-12-24 DIAGNOSIS — Z903 Acquired absence of stomach [part of]: Secondary | ICD-10-CM | POA: Diagnosis not present

## 2020-12-24 DIAGNOSIS — Z9221 Personal history of antineoplastic chemotherapy: Secondary | ICD-10-CM | POA: Diagnosis not present

## 2020-12-24 DIAGNOSIS — Z5112 Encounter for antineoplastic immunotherapy: Secondary | ICD-10-CM | POA: Diagnosis not present

## 2020-12-24 DIAGNOSIS — K644 Residual hemorrhoidal skin tags: Secondary | ICD-10-CM | POA: Diagnosis not present

## 2020-12-24 DIAGNOSIS — C163 Malignant neoplasm of pyloric antrum: Secondary | ICD-10-CM | POA: Diagnosis not present

## 2020-12-24 DIAGNOSIS — R1010 Upper abdominal pain, unspecified: Secondary | ICD-10-CM | POA: Diagnosis not present

## 2020-12-24 DIAGNOSIS — C169 Malignant neoplasm of stomach, unspecified: Secondary | ICD-10-CM | POA: Diagnosis not present

## 2020-12-24 DIAGNOSIS — R634 Abnormal weight loss: Secondary | ICD-10-CM | POA: Diagnosis not present

## 2020-12-24 LAB — CMP (CANCER CENTER ONLY)
ALT: 31 U/L (ref 0–44)
AST: 34 U/L (ref 15–41)
Albumin: 3.2 g/dL — ABNORMAL LOW (ref 3.5–5.0)
Alkaline Phosphatase: 162 U/L — ABNORMAL HIGH (ref 38–126)
Anion gap: 7 (ref 5–15)
BUN: 12 mg/dL (ref 8–23)
CO2: 24 mmol/L (ref 22–32)
Calcium: 9 mg/dL (ref 8.9–10.3)
Chloride: 97 mmol/L — ABNORMAL LOW (ref 98–111)
Creatinine: 0.65 mg/dL (ref 0.61–1.24)
GFR, Estimated: >60 mL/min (ref >60)
Glucose, Bld: 121 mg/dL — ABNORMAL HIGH (ref 70–99)
Potassium: 4.7 mmol/L (ref 3.5–5.1)
Sodium: 128 mmol/L — ABNORMAL LOW (ref 135–145)
Total Bilirubin: 0.6 mg/dL (ref 0.3–1.2)
Total Protein: 6.8 g/dL (ref 6.5–8.1)

## 2020-12-24 LAB — CBC WITH DIFFERENTIAL (CANCER CENTER ONLY)
Abs Immature Granulocytes: 0.01 10*3/uL (ref 0.00–0.07)
Basophils Absolute: 0 10*3/uL (ref 0.0–0.1)
Basophils Relative: 1 %
Eosinophils Absolute: 0 10*3/uL (ref 0.0–0.5)
Eosinophils Relative: 0 %
HCT: 37.4 % — ABNORMAL LOW (ref 39.0–52.0)
Hemoglobin: 12.6 g/dL — ABNORMAL LOW (ref 13.0–17.0)
Immature Granulocytes: 0 %
Lymphocytes Relative: 21 %
Lymphs Abs: 1.1 10*3/uL (ref 0.7–4.0)
MCH: 28.5 pg (ref 26.0–34.0)
MCHC: 33.7 g/dL (ref 30.0–36.0)
MCV: 84.6 fL (ref 80.0–100.0)
Monocytes Absolute: 0.5 10*3/uL (ref 0.1–1.0)
Monocytes Relative: 10 %
Neutro Abs: 3.4 10*3/uL (ref 1.7–7.7)
Neutrophils Relative %: 68 %
Platelet Count: 195 10*3/uL (ref 150–400)
RBC: 4.42 MIL/uL (ref 4.22–5.81)
RDW: 13.6 % (ref 11.5–15.5)
WBC Count: 5.1 10*3/uL (ref 4.0–10.5)
nRBC: 0 % (ref 0.0–0.2)

## 2020-12-24 LAB — RETICULOCYTES
Immature Retic Fract: 9 % (ref 2.3–15.9)
RBC.: 4.4 MIL/uL (ref 4.22–5.81)
Retic Count, Absolute: 40.5 10*3/uL (ref 19.0–186.0)
Retic Ct Pct: 0.9 % (ref 0.4–3.1)

## 2020-12-24 LAB — SAMPLE TO BLOOD BANK

## 2020-12-24 MED ORDER — SODIUM CHLORIDE 0.9 % IV SOLN: INTRAVENOUS | Status: DC

## 2020-12-24 MED ORDER — SODIUM CHLORIDE 0.9 % IV SOLN
40.0000 mg | Freq: Once | INTRAVENOUS | Status: DC
  Filled 2020-12-24: qty 4

## 2020-12-24 MED ORDER — SODIUM CHLORIDE 0.9% FLUSH
10.0000 mL | Freq: Once | INTRAVENOUS | Status: AC
  Administered 2020-12-24: 10 mL via INTRAVENOUS

## 2020-12-24 MED ORDER — SODIUM CHLORIDE 0.9 % IV SOLN
40.0000 mg | Freq: Once | INTRAVENOUS | Status: AC
  Administered 2020-12-24: 40 mg via INTRAVENOUS
  Filled 2020-12-24: qty 4

## 2020-12-24 MED ORDER — LORAZEPAM 1 MG PO TABS
0.5000 mg | ORAL_TABLET | Freq: Once | ORAL | Status: AC
  Administered 2020-12-24: 0.5 mg via ORAL
  Filled 2020-12-24: qty 1

## 2020-12-24 MED ORDER — SODIUM CHLORIDE 0.9 % IV SOLN: 40.0000 mg | Freq: Once | INTRAVENOUS | Status: DC

## 2020-12-24 MED ORDER — LORAZEPAM 2 MG/ML IJ SOLN: 0.2500 mg | Freq: Once | INTRAMUSCULAR | Status: DC

## 2020-12-24 MED ORDER — HEPARIN SOD (PORK) LOCK FLUSH 100 UNIT/ML IV SOLN
500.0000 [IU] | Freq: Once | INTRAVENOUS | Status: AC
  Administered 2020-12-24: 500 [IU] via INTRAVENOUS

## 2020-12-24 MED ORDER — SODIUM CHLORIDE 0.9 % IV SOLN: 1.0000 g | INTRAVENOUS | Status: DC

## 2020-12-24 NOTE — Addendum Note (Signed)
Addended by: [place] on: 12/24/2020 11:26 AM   Modules accepted: Orders

## 2020-12-24 NOTE — Patient Instructions (Signed)

## 2020-12-24 NOTE — Progress Notes (Signed)
..  Patient Assist/Replace for the following has been terminated. Medication: Keytruda (pembrolizumab) Reason for Termination: Patient transitioning to hospice care, assistance no longer needed.  Last DOS: 12/03/2020. Marland KitchenJuan Lewis, CPhT IV Drug Replacement Specialist [place] Phone: [phone]

## 2020-12-24 NOTE — Patient Instructions (Signed)
Implanted Port Home Guide An implanted port is a device that is placed under the skin. It is usually placed in the chest. The device can be used to give IV medicine, to take blood, or for dialysis. You may have an implanted port if: You need IV medicine that would be irritating to the small veins in your hands or arms. You need IV medicines, such as antibiotics, for a long period of time. You need IV nutrition for a long period of time. You need dialysis. When you have a port, your health care provider can choose to use the port instead of veins in your arms for these procedures. You may have fewer limitations when using a port than you would if you used other types of long-term IVs, and you will likely be able to return to normal activities after your incision heals. An implanted port has two main parts: Reservoir. The reservoir is the part where a needle is inserted to give medicines or draw blood. The reservoir is round. After it is placed, it appears as a small, raised area under your skin. Catheter. The catheter is a thin, flexible tube that connects the reservoir to a vein. Medicine that is inserted into the reservoir goes into the catheter and then into the vein. How is my port accessed? To access your port: A numbing cream may be placed on the skin over the port site. Your health care provider will put on a mask and sterile gloves. The skin over your port will be cleaned carefully with a germ-killing soap and allowed to dry. Your health care provider will gently pinch the port and insert a needle into it. Your health care provider will check for a blood return to make sure the port is in the vein and is not clogged. If your port needs to remain accessed to get medicine continuously (constant infusion), your health care provider will place a clear bandage (dressing) over the needle site. The dressing and needle will need to be changed every week, or as told by your health care provider. What  is flushing? Flushing helps keep the port from getting clogged. Follow instructions from your health care provider about how and when to flush the port. Ports are usually flushed with saline solution or a medicine called heparin. The need for flushing will depend on how the port is used: If the port is only used from time to time to give medicines or draw blood, the port may need to be flushed: Before and after medicines have been given. Before and after blood has been drawn. As part of routine maintenance. Flushing may be recommended every 4-6 weeks. If a constant infusion is running, the port may not need to be flushed. Throw away any syringes in a disposal container that is meant for sharp items (sharps container). You can buy a sharps container from a pharmacy, or you can make one by using an empty hard plastic bottle with a cover. How long will my port stay implanted? The port can stay in for as long as your health care provider thinks it is needed. When it is time for the port to come out, a surgery will be done to remove it. The surgery will be similar to the procedure that was done to put the port in. Follow these instructions at home:  Flush your port as told by your health care provider. If you need an infusion over several days, follow instructions from your health care provider about how   to take care of your port site. Make sure you: Wash your hands with soap and water before you change your dressing. If soap and water are not available, use alcohol-based hand sanitizer. Change your dressing as told by your health care provider. Place any used dressings or infusion bags into a plastic bag. Throw that bag in the trash. Keep the dressing that covers the needle clean and dry. Do not get it wet. Do not use scissors or sharp objects near the tube. Keep the tube clamped, unless it is being used. Check your port site every day for signs of infection. Check for: Redness, swelling, or  pain. Fluid or blood. Pus or a bad smell. Protect the skin around the port site. Avoid wearing bra straps that rub or irritate the site. Protect the skin around your port from seat belts. Place a soft pad over your chest if needed. Bathe or shower as told by your health care provider. The site may get wet as long as you are not actively receiving an infusion. Return to your normal activities as told by your health care provider. Ask your health care provider what activities are safe for you. Carry a medical alert card or wear a medical alert bracelet at all times. This will let health care providers know that you have an implanted port in case of an emergency. Get help right away if: You have redness, swelling, or pain at the port site. You have fluid or blood coming from your port site. You have pus or a bad smell coming from the port site. You have a fever. Summary Implanted ports are usually placed in the chest for long-term IV access. Follow instructions from your health care provider about flushing the port and changing bandages (dressings). Take care of the area around your port by avoiding clothing that puts pressure on the area, and by watching for signs of infection. Protect the skin around your port from seat belts. Place a soft pad over your chest if needed. Get help right away if you have a fever or you have redness, swelling, pain, drainage, or a bad smell at the port site. This information is not intended to replace advice given to you by your health care provider. Make sure you discuss any questions you have with your health care provider. Document Revised: 06/10/2020 Document Reviewed: 08/05/2019 Elsevier Patient Education  2022 [employer]  

## 2020-12-24 NOTE — Progress Notes (Signed)
Hematology and Oncology Follow Up Visit  Jonathan Lewis [id number] 02/12/1941 80 y.o. 12/24/2020   Principle Diagnosis:  Stage IIIA (T3N2M0) adenocarcinoma of the stomach --  S/p distal gastrectomy on 03/19/2019  -- HER2-/PD-L1+ -- metastatic   Current Therapy:    Carbo/Taxol/Keytruda -- start cycle #1 on 11/16/2020 -  XRT -- start on 09/18/2020 -- completed on 10/15/2020      Adjuvant FOLFOX - started on 05/13/2019, s/p cycle #8 -completed on Dec 11, 2019   Interim History:  Jonathan Lewis is here today for follow-up.  Unfortunately, I suspect this to be his last visit with [place].  His weight is now down to 139 pounds.  He recently was in the hospital.  It is clear  that this cancer is progressing.  He is not eating.  He just feels tired.  Again the weight loss clearly shows [place] what the problem is.  I had a long talk with he and his wife.  I think we have to get Hospice involved now.  I explained what hospice is.  He is in agreement.  I know that hospice will do a great job with him.  I talked to him about end-of-life issues.  I told him and his wife that I just do not think that he will make it through October.  I just believe that his weight loss and the fact that he is not eating is clearly going to be the determinant for him.  He does not want to be kept alive on machines.  He knows what this means.  He wants to go to heaven naturally.  He has a strong faith.  He is a [employer] park manager.  He knows that he is going to when.  As such, he is a DO NOT RESUSCITATE.  I just want to help him as much as I can.  He has done everything we have asked him to do.  This is just proven to be an incredibly aggressive malignancy.  It came back despite having aggressive adjuvant therapy.  He has had no problems with bleeding.  He has had no obvious diarrhea.  He has had no problems with pain.  He has a biliary catheter is in.  He has an appointment tomorrow with radiology but we will go ahead and cancel this.  I  will give him some IV fluid today.  I will give him some extra medication to try to make him feel better.  I went through his medication list.  We got rid of medicines that he just will not need.  I just hate this for [employer].  He is trying hard.  He has great support at home.  Currently, I would say his performance status is probably ECOG 3-4.     Medications:  Allergies as of 12/24/2020   No Known Allergies      Medication List        Accurate as of December 24, 2020  9:54 AM. If you have any questions, ask your nurse or doctor.          atorvastatin 20 MG tablet Commonly known as: LIPITOR Take 20 mg by mouth at bedtime.   dexamethasone 4 MG tablet Commonly known as: DECADRON Take 2 tablets (8 mg total) by mouth daily. Start the day after carboplatin chemotherapy for 3 days. What changed:  when to take this additional instructions   dorzolamide-timolol 22.3-6.8 MG/ML ophthalmic solution Commonly known as: COSOPT Place 1 drop into both eyes 2 (two)  times daily.   famotidine 20 MG tablet Commonly known as: PEPCID TAKE 1 TABLET BY MOUTH EVERY DAY   feeding supplement Liqd Take 237 mLs by mouth 3 (three) times daily between meals.   latanoprost 0.005 % ophthalmic solution Commonly known as: XALATAN Place 1 drop into both eyes at bedtime.   lidocaine-prilocaine cream Commonly known as: EMLA Apply 1 application topically as needed. Place on the port one hour before appointment. What changed:  when to take this reasons to take this additional instructions   meclizine 12.5 MG tablet Commonly known as: ANTIVERT TAKE 1 TABLET BY MOUTH 3 TIMES DAILY AS NEEDED FOR DIZZINESS. What changed: See the new instructions.   multivitamin with minerals Tabs tablet Take 1 tablet by mouth in the morning. Men's One-A-Day Chewable   ondansetron 8 MG tablet Commonly known as: ZOFRAN Take 1 tablet (8 mg total) by mouth every 8 (eight) hours as needed for nausea or  vomiting.   potassium chloride 20 MEQ packet Commonly known as: Klor-Con Take 20 mEq by mouth 2 (two) times daily.   senna-docusate 8.6-50 MG tablet Commonly known as: Senokot S Take 1 tablet by mouth daily as needed for mild constipation.   Systane Ultra 0.4-0.3 % Soln Generic drug: Polyethyl Glycol-Propyl Glycol Place 1 drop into both eyes 3 (three) times daily as needed (dry eyes).   tamsulosin 0.4 MG Caps capsule Commonly known as: FLOMAX Take 1 capsule (0.4 mg total) by mouth daily.        Allergies: No Known Allergies  Past Medical History, Surgical history, Social history, and Family History were reviewed and updated.  Review of Systems: Review of Systems  Constitutional: Negative.   HENT: Negative.    Eyes: Negative.   Respiratory: Negative.    Cardiovascular: Negative.   Gastrointestinal:  Positive for abdominal pain.  Genitourinary: Negative.   Musculoskeletal: Negative.   Skin: Negative.   Neurological:  Positive for tingling.  Endo/Heme/Allergies: Negative.   Psychiatric/Behavioral: Negative.      Physical Exam:  height is _0  (1.727 m) and weight is 139 lb 1.3 oz (63.1 kg). His oral temperature is 97.8 F (36.6 C). His blood pressure is 117/83 and his pulse is 96. His respiration is 18 and oxygen saturation is 97%.   Wt Readings from Last 3 Encounters:  12/24/20 139 lb 1.3 oz (63.1 kg)  12/10/20 148 lb 13 oz (67.5 kg)  12/03/20 150 lb (68 kg)    Physical Activity: Not on file    Physical Exam Vitals reviewed.  Constitutional:      Comments: Overall, this is a thin African-American male.  He is in no distress.  He is quite weak.  He has muscle atrophy in upper and lower extremities.  HENT:     Head: Normocephalic and atraumatic.  Eyes:     Pupils: Pupils are equal, round, and reactive to light.  Cardiovascular:     Rate and Rhythm: Normal rate and regular rhythm.     Heart sounds: Normal heart sounds.  Pulmonary:     Effort: Pulmonary  effort is normal.     Breath sounds: Normal breath sounds.  Abdominal:     General: Bowel sounds are normal.     Palpations: Abdomen is soft.     Comments: Abdominal exam is soft.  He has well-healed laparotomy scars.  He has a biliary catheter in place.  There is no tenderness to palpation.  He has no guarding or rebound tenderness.  There is no  fluid wave.  There is no palpable liver or spleen tip.  Genitourinary:    Comments: On his rectal exam, he does have an external hemorrhoid.  This is not thrombosed or bleeding.  His rectal vault is smooth.  He has very little stool.  It is heme positive. Musculoskeletal:        General: No tenderness or deformity. Normal range of motion.     Cervical back: Normal range of motion.  Lymphadenopathy:     Cervical: No cervical adenopathy.  Skin:    General: Skin is warm and dry.     Findings: No erythema or rash.  Neurological:     Mental Status: He is alert and oriented to person, place, and time.  Psychiatric:        Behavior: Behavior normal.        Thought Content: Thought content normal.        Judgment: Judgment normal.    Lab Results  Component Value Date   WBC 5.1 12/24/2020   HGB 12.6 (L) 12/24/2020   HCT 37.4 (L) 12/24/2020   MCV 84.6 12/24/2020   PLT 195 12/24/2020   Lab Results  Component Value Date   FERRITIN 1,033 (H) 12/03/2020   IRON 51 12/03/2020   TIBC 185 (L) 12/03/2020   UIBC 135 12/03/2020   IRONPCTSAT 27 12/03/2020   Lab Results  Component Value Date   RETICCTPCT 0.9 12/24/2020   RBC 4.40 12/24/2020   No results found for: KPAFRELGTCHN, LAMBDASER, KAPLAMBRATIO No results found for: IGGSERUM, IGA, IGMSERUM No results found for: TOTALPROTELP, ALBUMINELP, A1GS, A2GS, BETS, BETA2SER, GAMS, MSPIKE, SPEI   Chemistry      Component Value Date/Time   NA 128 (L) 12/24/2020 0820   K 4.7 12/24/2020 0820   CL 97 (L) 12/24/2020 0820   CO2 24 12/24/2020 0820   BUN 12 12/24/2020 0820   CREATININE 0.65 12/24/2020  0820   CREATININE 1.06 07/16/2012 1038      Component Value Date/Time   CALCIUM 9.0 12/24/2020 0820   ALKPHOS 162 (H) 12/24/2020 0820   AST 34 12/24/2020 0820   ALT 31 12/24/2020 0820   BILITOT 0.6 12/24/2020 0820       Impression and Plan: Jonathan Lewis is a very pleasant 80 yo African American gentleman with stage IIIA (T3N2M0) adenocarcinoma of the stomach, HER2-/PD-L1+. He had a distal gastrectomy on 03/19/2019 and was treated with adjuvant  FOLFOX.   He developed metastatic disease.  He had bleeding when he tried him on Taxol/Cyramza.  I am sure that the Cyramza was probably the source of the bleeding.  He then had biliary obstruction from a periportal lymph node.  He then had radiation therapy for this.  The jaundice resolved.  His bilirubin normalized.  Again, he is end-stage now.  Again I suspect that he probably will not survive the month of October.  Hospice will help out a lot.  We will call Hospice so then come out to see him.  I just want to make sure that he has respect and dignity.  He clearly deserves this.  I told him that if he did not feel like eating it that would be okay.  I do not want to force himself to eat and then get sick.  We will give him some IV fluid in the office.  I think this will help a little bit.  I just hate the fact that this will be the last time that we see him.  He  has been so kind.  He truly is a man of God.  His faith has been incredibly strong.  We voiced had good fellowship.     Volanda Napoleon, MD 9/22/20229:54 AM

## 2020-12-24 NOTE — Progress Notes (Signed)
Call placed to [facility] and order placed for pt to begin hospice care and not palliative care per order of Dr. Marin Olp. Referral sent via Epic.

## 2020-12-24 NOTE — Progress Notes (Signed)
VO Ok to give 0.5mg  PO ativan per MD

## 2020-12-25 ENCOUNTER — Telehealth: Payer: Self-pay | Admitting: Hematology & Oncology

## 2020-12-25 ENCOUNTER — Ambulatory Visit (HOSPITAL_COMMUNITY): Payer: Medicare PPO

## 2020-12-25 ENCOUNTER — Inpatient Hospital Stay (HOSPITAL_COMMUNITY)
Admission: RE | Admit: 2020-12-25 | Discharge: 2020-12-25 | Disposition: A | Discharge status: Home / Self Care | Payer: Medicare PPO | Source: Ambulatory Visit | Attending: Radiology | Admitting: Radiology

## 2020-12-25 DIAGNOSIS — K831 Obstruction of bile duct: Secondary | ICD-10-CM | POA: Diagnosis not present

## 2020-12-25 DIAGNOSIS — D709 Neutropenia, unspecified: Secondary | ICD-10-CM | POA: Diagnosis not present

## 2020-12-25 DIAGNOSIS — E1140 Type 2 diabetes mellitus with diabetic neuropathy, unspecified: Secondary | ICD-10-CM | POA: Diagnosis not present

## 2020-12-25 DIAGNOSIS — K5900 Constipation, unspecified: Secondary | ICD-10-CM | POA: Diagnosis not present

## 2020-12-25 DIAGNOSIS — C169 Malignant neoplasm of stomach, unspecified: Secondary | ICD-10-CM | POA: Diagnosis not present

## 2020-12-25 DIAGNOSIS — I10 Essential (primary) hypertension: Secondary | ICD-10-CM | POA: Diagnosis not present

## 2020-12-25 DIAGNOSIS — D630 Anemia in neoplastic disease: Secondary | ICD-10-CM | POA: Diagnosis not present

## 2020-12-25 DIAGNOSIS — Z4803 Encounter for change or removal of drains: Secondary | ICD-10-CM | POA: Diagnosis not present

## 2020-12-25 DIAGNOSIS — D333 Benign neoplasm of cranial nerves: Secondary | ICD-10-CM | POA: Diagnosis not present

## 2020-12-25 LAB — TSH: TSH: 2.827 u[IU]/mL (ref 0.320–4.118)

## 2020-12-25 LAB — FERRITIN: Ferritin: 1913 ng/mL — ABNORMAL HIGH (ref 24–336)

## 2020-12-25 LAB — IRON AND TIBC
Iron: 71 ug/dL (ref 42–163)
Saturation Ratios: 29 % (ref 20–55)
TIBC: 249 ug/dL (ref 202–409)
UIBC: 177 ug/dL (ref 117–376)

## 2020-12-25 LAB — T4: T4, Total: 9.8 ug/dL (ref 4.5–12.0)

## 2020-12-25 NOTE — Telephone Encounter (Signed)
No los 9/22

## 2020-12-28 ENCOUNTER — Ambulatory Visit: Payer: Medicare PPO | Admitting: Internal Medicine

## 2021-01-03 ENCOUNTER — Other Ambulatory Visit: Payer: Self-pay | Admitting: Hematology & Oncology

## 2021-01-03 DIAGNOSIS — C162 Malignant neoplasm of body of stomach: Secondary | ICD-10-CM

## 2021-01-04 ENCOUNTER — Encounter: Payer: Self-pay | Admitting: Hematology & Oncology

## 2021-01-06 DIAGNOSIS — G8929 Other chronic pain: Secondary | ICD-10-CM | POA: Diagnosis not present

## 2021-01-06 DIAGNOSIS — J439 Emphysema, unspecified: Secondary | ICD-10-CM | POA: Diagnosis not present

## 2021-01-06 DIAGNOSIS — C160 Malignant neoplasm of cardia: Secondary | ICD-10-CM | POA: Diagnosis not present

## 2021-01-06 DIAGNOSIS — C779 Secondary and unspecified malignant neoplasm of lymph node, unspecified: Secondary | ICD-10-CM | POA: Diagnosis not present

## 2021-01-06 DIAGNOSIS — C164 Malignant neoplasm of pylorus: Secondary | ICD-10-CM | POA: Diagnosis not present

## 2021-01-06 DIAGNOSIS — J309 Allergic rhinitis, unspecified: Secondary | ICD-10-CM | POA: Diagnosis not present

## 2021-01-06 DIAGNOSIS — E785 Hyperlipidemia, unspecified: Secondary | ICD-10-CM | POA: Diagnosis not present

## 2021-01-06 DIAGNOSIS — C169 Malignant neoplasm of stomach, unspecified: Secondary | ICD-10-CM | POA: Diagnosis not present

## 2021-01-06 DIAGNOSIS — C787 Secondary malignant neoplasm of liver and intrahepatic bile duct: Secondary | ICD-10-CM | POA: Diagnosis not present

## 2021-02-03 ENCOUNTER — Encounter: Payer: Self-pay | Admitting: Hematology & Oncology

## 2021-02-10 ENCOUNTER — Ambulatory Visit: Payer: Medicare PPO | Admitting: Internal Medicine

## 2021-03-16 ENCOUNTER — Telehealth: Payer: Self-pay

## 2021-03-16 NOTE — Telephone Encounter (Signed)
Notification from [facility] that patient passed on Apr 13, 2021 at 0803 at home. MD notified.

## 2021-03-16 NOTE — Telephone Encounter (Signed)
error 

## 2021-04-04 DEATH — deceased

## 2021-04-23 ENCOUNTER — Other Ambulatory Visit: Payer: Self-pay | Admitting: Internal Medicine

## 2022-08-17 IMAGING — CT CT CHEST-ABD-PELV W/ CM
3 of 5 series · 15 of 36 positions shown, 17 images · IV contrast (OMNIPAQUE 300)
Comparison: CT abdomen pelvis 09/10/2020 and CT chest 06/12/2020.
MR abdomen 09/04/2020 and PET 06/25/2020

CLINICAL DATA: Abdominal pain, fever, weakness. Urinary tract
infection. Gastrointestinal cancer.

EXAM:
CT CHEST, ABDOMEN, AND PELVIS WITH CONTRAST
TECHNIQUE: Multidetector CT imaging of the chest, abdomen and pelvis was
performed following the standard protocol during bolus
administration of intravenous contrast.
CONTRAST:  100mL OMNIPAQUE IOHEXOL 300 MG/ML  SOLN

[Series 2: cap with · axial · 0.74mm/px · z∈[-558,-63]mm · 10 of 123 slices shown, 12 images]
[im 12/123  mediastinal]
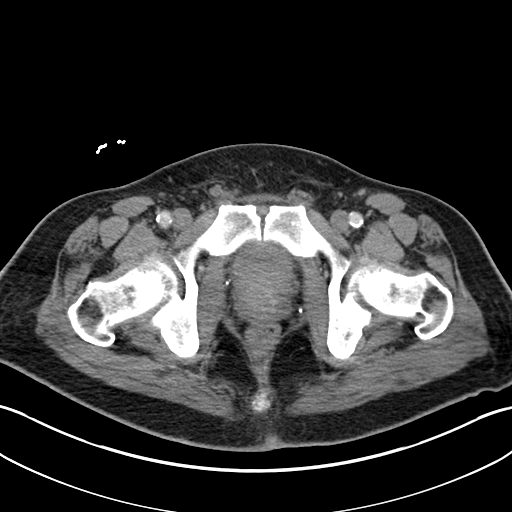
[im 12/123  bone]
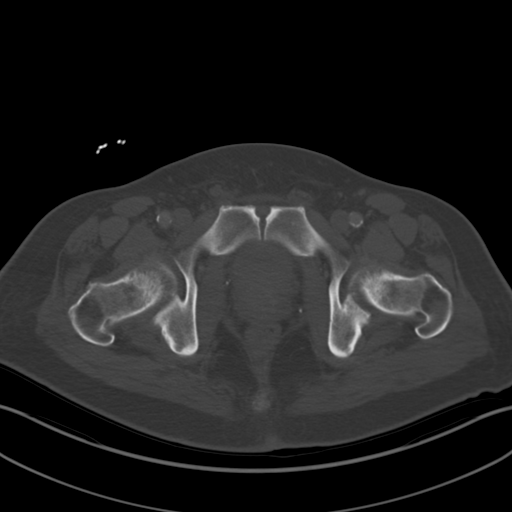
[im 23/123  mediastinal]
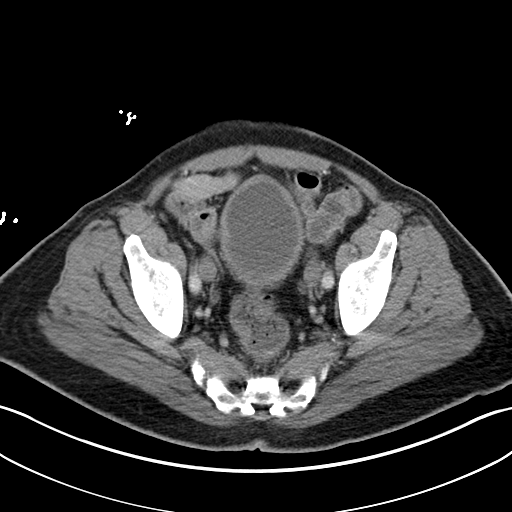
[im 34/123  mediastinal]
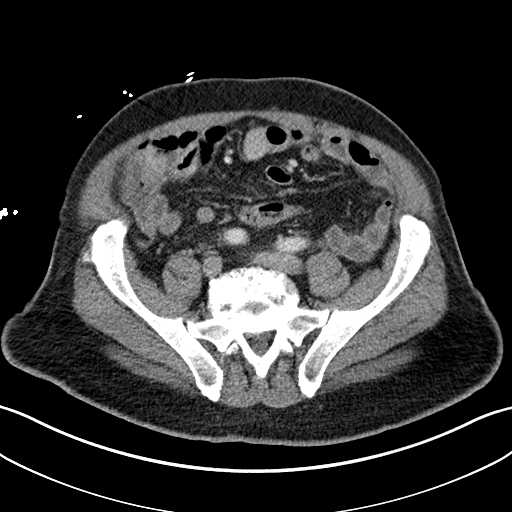
[im 45/123  mediastinal]
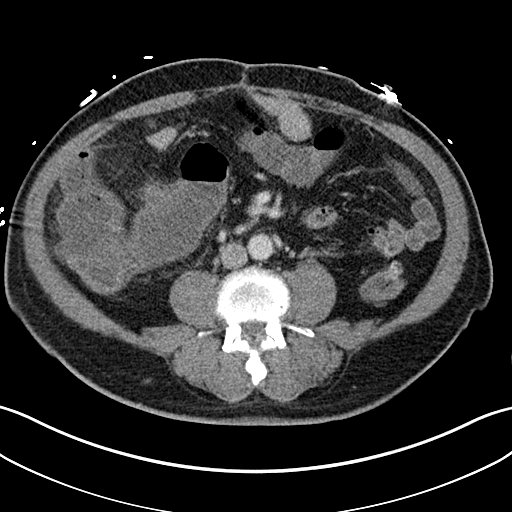
[im 56/123  mediastinal]
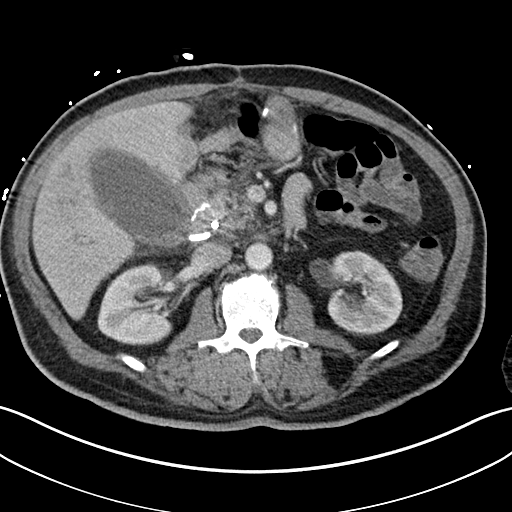
[im 67/123  mediastinal]
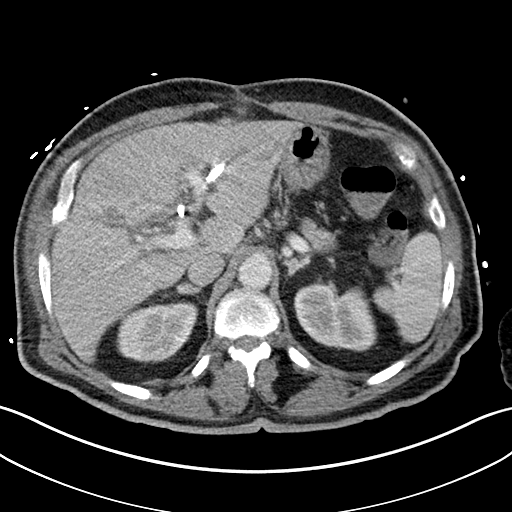
[im 78/123  mediastinal]
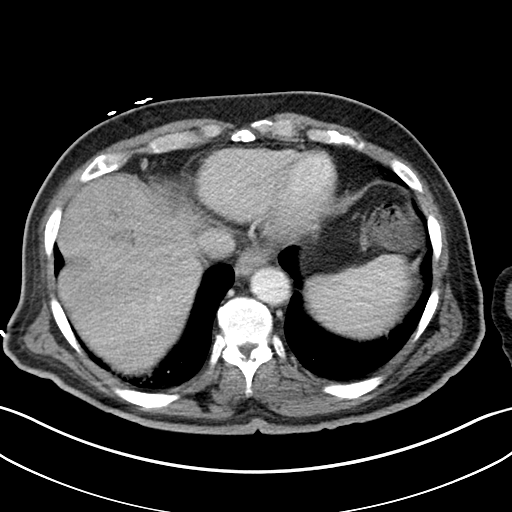
[im 89/123  mediastinal]
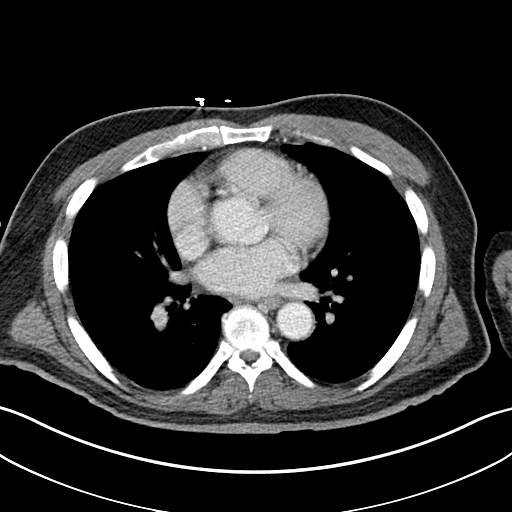
[im 100/123  mediastinal]
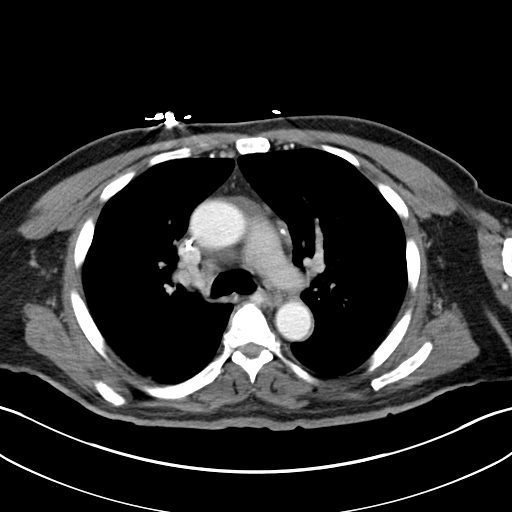
[im 100/123  bone]
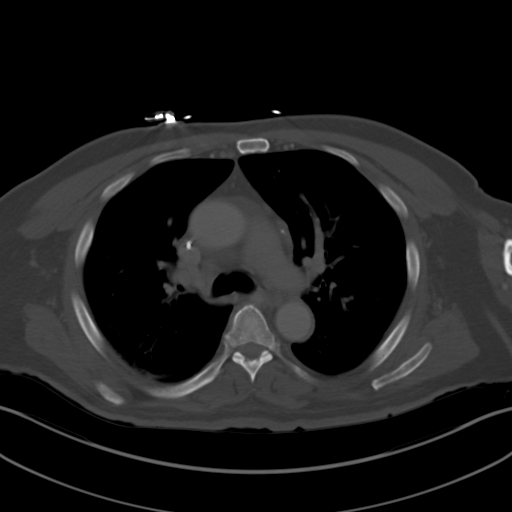
[im 111/123  mediastinal]
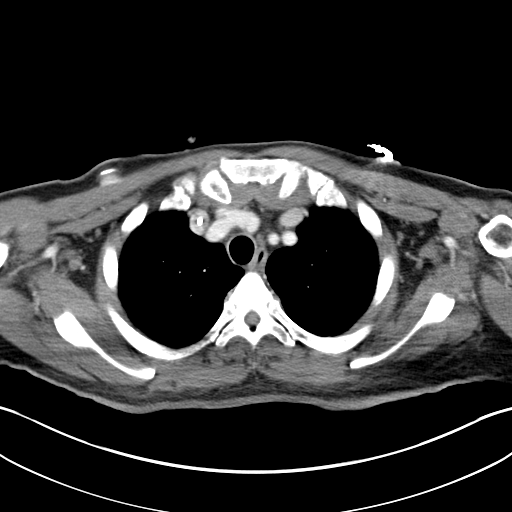

[Series 4: coronals · coronal · 0.89mm/px · 3 of 157 slices shown]
[im 32/157  mediastinal]
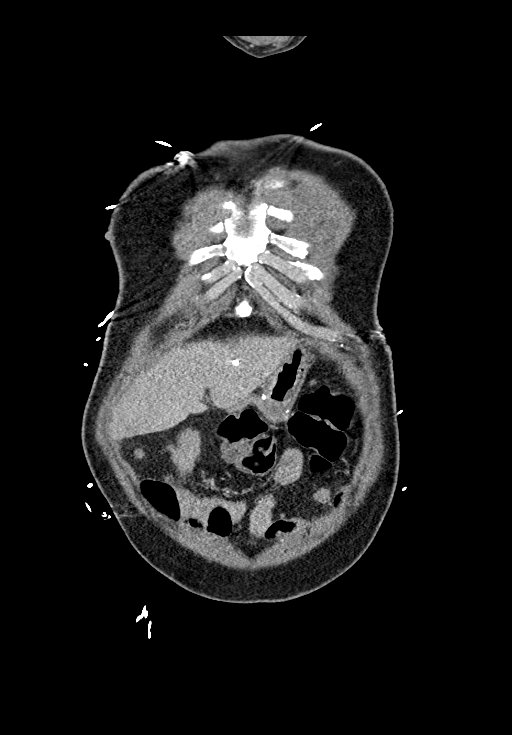
[im 63/157  mediastinal]
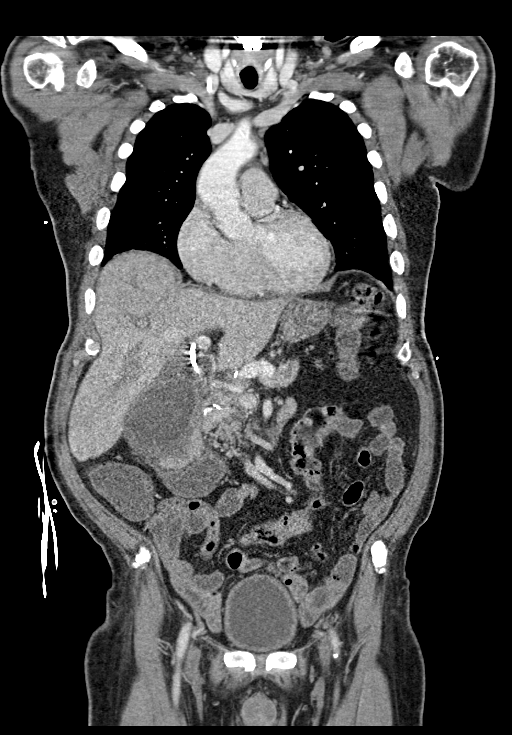
[im 94/157  mediastinal]
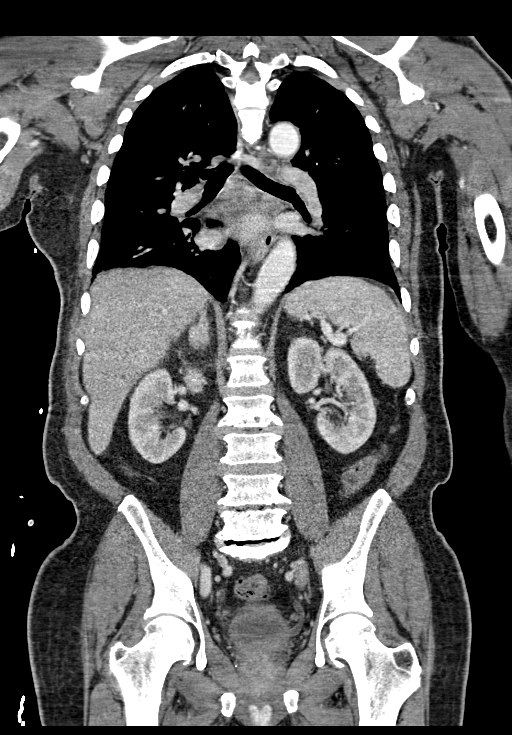

[Series 6: lung · axial · 0.74mm/px · z∈[-233,-211]mm · 2 of 127 slices shown]
[im 12/127  bone]
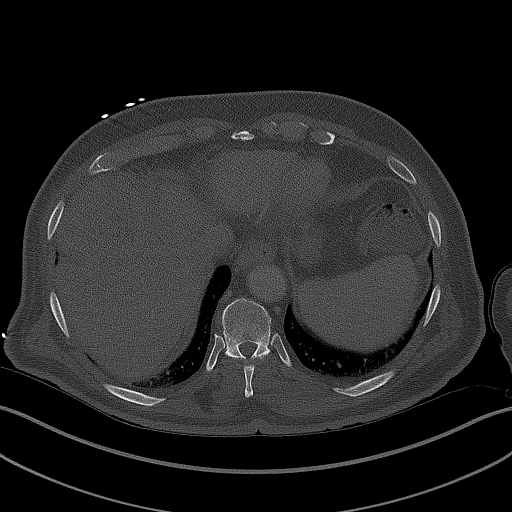
[im 23/127  bone]
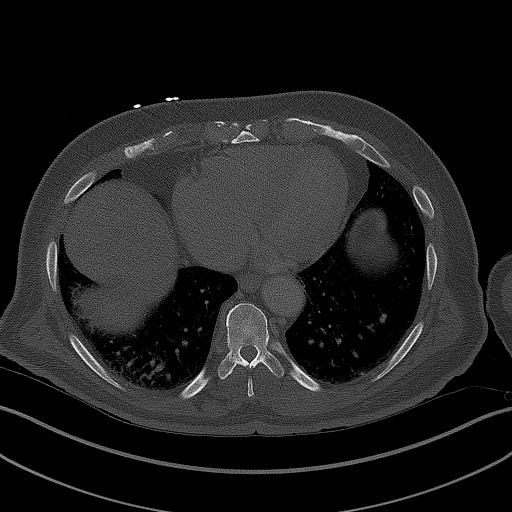

[15 of 36 positions shown; findings below may reference images not displayed]

FINDINGS: CT CHEST FINDINGS

Cardiovascular: Right IJ Port-A-Cath terminates at the SVC RA
junction. Heart is at the upper limits of normal in size. No
pericardial effusion.

Mediastinum/Nodes: Mediastinal lymph nodes measure up to 11 mm in
the low right paratracheal station, similar. Calcified mediastinal
lymph nodes. No hilar or axillary adenopathy. Esophagus is grossly.

Lungs/Pleura: Centrilobular and paraseptal emphysema. Calcified
granulomas. Minimal dependent atelectasis bilaterally. No pleural
fluid. Adherent debris in the right mainstem bronchus.

Musculoskeletal: Degenerative changes in the spine. No worrisome
lytic or sclerotic lesions.

CT ABDOMEN PELVIS FINDINGS

Hepatobiliary: Similar biliary ductal dilatation with a percutaneous
biliary drainage catheter in place, terminating in the duodenum.
Associated centrally obstructing mass, better seen on 09/04/2020. 9
mm low-attenuation lesion in the periphery of segment 4 (2/65),
unchanged and too small to characterize. Other subcapsular
metastases seen on 09/03/2020 are not readily appreciated.
Gallbladder is dilated.

Pancreas: Mass seen in the pancreatic body on 09/04/2020 is poorly
appreciated.

Spleen: Negative.

Adrenals/Urinary Tract: Adrenal glands are unremarkable. Right renal
stone. Kidneys are otherwise unremarkable. Ureters are decompressed.
Bladder may be minimally thick-walled.

Stomach/Bowel: Distal gastrectomy. Stomach, small bowel and appendix
are otherwise unremarkable. Fluid seen in the colon.

Vascular/Lymphatic: Vascular structures are unremarkable. No
pathologically enlarged lymph nodes.

Reproductive: Prostate is visualized.

Other: Bilateral omental nodules measure up to 1.4 cm, as on
09/10/2020. Mesenteric nodules measure up to 8 mm in short axis
(2/86). No free fluid. Left inguinal hernia contains fat.

Musculoskeletal: Degenerative changes in the spine.
IMPRESSION: 1. No findings to explain the patient's acute clinical history.
2. Peritoneal/omental metastases, as on 09/10/2020.
3. Percutaneous biliary drain in place with similar gallbladder and
biliary ductal dilatation. Associated obstructing mass seen on
09/04/2020 is better seen on that study.
4. Pancreatic body mass, also better seen on 09/04/2020.
5. Right renal stone.
